# Patient Record
Sex: Male | Born: 1955
Health system: Southern US, Community
[De-identification: ages and names within clinical notes are randomized; demographics above are authoritative.]

## PROBLEM LIST (undated history)

## (undated) DIAGNOSIS — I714 Abdominal aortic aneurysm, without rupture, unspecified: Secondary | ICD-10-CM

## (undated) DIAGNOSIS — Z8619 Personal history of other infectious and parasitic diseases: Secondary | ICD-10-CM

## (undated) DIAGNOSIS — Z8661 Personal history of infections of the central nervous system: Secondary | ICD-10-CM

## (undated) DIAGNOSIS — E785 Hyperlipidemia, unspecified: Secondary | ICD-10-CM

## (undated) DIAGNOSIS — R519 Headache, unspecified: Secondary | ICD-10-CM

## (undated) DIAGNOSIS — K759 Inflammatory liver disease, unspecified: Secondary | ICD-10-CM

## (undated) DIAGNOSIS — L732 Hidradenitis suppurativa: Secondary | ICD-10-CM

## (undated) DIAGNOSIS — R439 Unspecified disturbances of smell and taste: Secondary | ICD-10-CM

## (undated) DIAGNOSIS — M47812 Spondylosis without myelopathy or radiculopathy, cervical region: Secondary | ICD-10-CM

## (undated) DIAGNOSIS — R188 Other ascites: Secondary | ICD-10-CM

## (undated) DIAGNOSIS — IMO0002 Reserved for concepts with insufficient information to code with codable children: Secondary | ICD-10-CM

## (undated) DIAGNOSIS — I2510 Atherosclerotic heart disease of native coronary artery without angina pectoris: Secondary | ICD-10-CM

## (undated) DIAGNOSIS — K7460 Unspecified cirrhosis of liver: Secondary | ICD-10-CM

## (undated) DIAGNOSIS — K219 Gastro-esophageal reflux disease without esophagitis: Secondary | ICD-10-CM

## (undated) DIAGNOSIS — G894 Chronic pain syndrome: Secondary | ICD-10-CM

## (undated) DIAGNOSIS — Z8701 Personal history of pneumonia (recurrent): Secondary | ICD-10-CM

## (undated) DIAGNOSIS — R351 Nocturia: Secondary | ICD-10-CM

## (undated) DIAGNOSIS — E119 Type 2 diabetes mellitus without complications: Secondary | ICD-10-CM

## (undated) DIAGNOSIS — R892 Abnormal level of other drugs, medicaments and biological substances in specimens from other organs, systems and tissues: Secondary | ICD-10-CM

## (undated) DIAGNOSIS — M5126 Other intervertebral disc displacement, lumbar region: Secondary | ICD-10-CM

## (undated) DIAGNOSIS — E538 Deficiency of other specified B group vitamins: Secondary | ICD-10-CM

## (undated) DIAGNOSIS — R51 Headache: Secondary | ICD-10-CM

## (undated) DIAGNOSIS — M4800 Spinal stenosis, site unspecified: Secondary | ICD-10-CM

## (undated) DIAGNOSIS — I219 Acute myocardial infarction, unspecified: Secondary | ICD-10-CM

## (undated) DIAGNOSIS — I10 Essential (primary) hypertension: Secondary | ICD-10-CM

## (undated) DIAGNOSIS — I255 Ischemic cardiomyopathy: Secondary | ICD-10-CM

## (undated) DIAGNOSIS — J449 Chronic obstructive pulmonary disease, unspecified: Secondary | ICD-10-CM

## (undated) DIAGNOSIS — E669 Obesity, unspecified: Secondary | ICD-10-CM

## (undated) DIAGNOSIS — H269 Unspecified cataract: Secondary | ICD-10-CM

## (undated) DIAGNOSIS — G600 Hereditary motor and sensory neuropathy: Secondary | ICD-10-CM

## (undated) DIAGNOSIS — K7581 Nonalcoholic steatohepatitis (NASH): Secondary | ICD-10-CM

## (undated) DIAGNOSIS — R0600 Dyspnea, unspecified: Secondary | ICD-10-CM

## (undated) DIAGNOSIS — R0609 Other forms of dyspnea: Secondary | ICD-10-CM

## (undated) DIAGNOSIS — I252 Old myocardial infarction: Secondary | ICD-10-CM

## (undated) DIAGNOSIS — M169 Osteoarthritis of hip, unspecified: Secondary | ICD-10-CM

## (undated) HISTORY — DX: Abdominal aortic aneurysm, without rupture: I71.4

## (undated) HISTORY — DX: Spondylosis without myelopathy or radiculopathy, cervical region: M47.812

## (undated) HISTORY — DX: Abnormal level of other drugs, medicaments and biological substances in specimens from other organs, systems and tissues: R89.2

## (undated) HISTORY — DX: Abdominal aortic aneurysm, without rupture, unspecified: I71.40

## (undated) HISTORY — DX: Unspecified cirrhosis of liver: K74.60

## (undated) HISTORY — DX: Type 2 diabetes mellitus without complications: E11.9

## (undated) HISTORY — DX: Ischemic cardiomyopathy: I25.5

## (undated) HISTORY — PX: CORONARY ANGIOPLASTY: SHX604

## (undated) HISTORY — PX: CORONARY ANGIOPLASTY WITH STENT PLACEMENT: SHX49

## (undated) HISTORY — DX: Personal history of other infectious and parasitic diseases: Z86.19

## (undated) HISTORY — DX: Atherosclerotic heart disease of native coronary artery without angina pectoris: I25.10

## (undated) HISTORY — PX: LUMBAR DISC SURGERY: SHX700

## (undated) HISTORY — PX: CARDIAC CATHETERIZATION: SHX172

## (undated) HISTORY — DX: Hidradenitis suppurativa: L73.2

## (undated) HISTORY — DX: Essential (primary) hypertension: I10

## (undated) HISTORY — DX: Old myocardial infarction: I25.2

## (undated) HISTORY — PX: MYELOGRAM: SHX5347

## (undated) HISTORY — DX: Chronic pain syndrome: G89.4

## (undated) HISTORY — DX: Personal history of infections of the central nervous system: Z86.61

## (undated) HISTORY — DX: Hyperlipidemia, unspecified: E78.5

## (undated) HISTORY — PX: CARDIOVASCULAR STRESS TEST: SHX262

## (undated) HISTORY — DX: Spinal stenosis, site unspecified: M48.00

## (undated) HISTORY — DX: Chronic obstructive pulmonary disease, unspecified: J44.9

## (undated) HISTORY — DX: Nonalcoholic steatohepatitis (NASH): K75.81

## (undated) HISTORY — DX: Unspecified disturbances of smell and taste: R43.9

## (undated) HISTORY — DX: Reserved for concepts with insufficient information to code with codable children: IMO0002

## (undated) HISTORY — DX: Obesity, unspecified: E66.9

## (undated) HISTORY — DX: Osteoarthritis of hip, unspecified: M16.9

## (undated) HISTORY — DX: Other intervertebral disc displacement, lumbar region: M51.26

## (undated) HISTORY — DX: Deficiency of other specified B group vitamins: E53.8

---
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
## 1981-06-05 DIAGNOSIS — Z8619 Personal history of other infectious and parasitic diseases: Secondary | ICD-10-CM

## 1981-06-05 HISTORY — DX: Personal history of other infectious and parasitic diseases: Z86.19

## 1998-06-05 DIAGNOSIS — Z8661 Personal history of infections of the central nervous system: Secondary | ICD-10-CM

## 1998-06-05 HISTORY — DX: Personal history of infections of the central nervous system: Z86.61

## 1998-07-20 ENCOUNTER — Inpatient Hospital Stay (HOSPITAL_COMMUNITY): Admission: EM | Admit: 1998-07-20 | Discharge: 1998-07-22 | Payer: Self-pay | Admitting: Emergency Medicine

## 1998-07-20 ENCOUNTER — Encounter: Payer: Self-pay | Admitting: Internal Medicine

## 1999-03-13 ENCOUNTER — Emergency Department (HOSPITAL_COMMUNITY): Admission: EM | Admit: 1999-03-13 | Discharge: 1999-03-13 | Payer: Self-pay | Admitting: Emergency Medicine

## 1999-08-01 ENCOUNTER — Emergency Department (HOSPITAL_COMMUNITY): Admission: EM | Admit: 1999-08-01 | Discharge: 1999-08-01 | Payer: Self-pay | Admitting: Emergency Medicine

## 1999-08-01 ENCOUNTER — Encounter: Payer: Self-pay | Admitting: Emergency Medicine

## 1999-08-04 ENCOUNTER — Emergency Department (HOSPITAL_COMMUNITY): Admission: EM | Admit: 1999-08-04 | Discharge: 1999-08-04 | Payer: Self-pay | Admitting: Emergency Medicine

## 2002-09-01 ENCOUNTER — Emergency Department (HOSPITAL_COMMUNITY): Admission: EM | Admit: 2002-09-01 | Discharge: 2002-09-01 | Payer: Self-pay | Admitting: Emergency Medicine

## 2002-10-05 ENCOUNTER — Encounter: Payer: Self-pay | Admitting: Emergency Medicine

## 2002-10-05 ENCOUNTER — Emergency Department (HOSPITAL_COMMUNITY): Admission: EM | Admit: 2002-10-05 | Discharge: 2002-10-05 | Payer: Self-pay

## 2004-03-19 ENCOUNTER — Inpatient Hospital Stay (HOSPITAL_COMMUNITY): Admission: EM | Admit: 2004-03-19 | Discharge: 2004-03-21 | Payer: Self-pay | Admitting: Emergency Medicine

## 2004-03-19 ENCOUNTER — Ambulatory Visit: Admission: RE | Admit: 2004-03-19 | Discharge: 2004-03-19 | Payer: Self-pay | Admitting: Cardiology

## 2004-04-06 ENCOUNTER — Ambulatory Visit: Payer: Self-pay | Admitting: *Deleted

## 2004-04-06 ENCOUNTER — Ambulatory Visit: Payer: Self-pay | Admitting: Internal Medicine

## 2004-05-23 ENCOUNTER — Ambulatory Visit: Payer: Self-pay | Admitting: Internal Medicine

## 2004-07-07 ENCOUNTER — Ambulatory Visit: Payer: Self-pay | Admitting: Internal Medicine

## 2004-11-11 ENCOUNTER — Ambulatory Visit: Payer: Self-pay | Admitting: Internal Medicine

## 2005-03-23 ENCOUNTER — Ambulatory Visit: Payer: Self-pay | Admitting: Cardiology

## 2005-03-30 ENCOUNTER — Inpatient Hospital Stay (HOSPITAL_BASED_OUTPATIENT_CLINIC_OR_DEPARTMENT_OTHER): Admission: RE | Admit: 2005-03-30 | Discharge: 2005-03-30 | Payer: Self-pay | Admitting: Cardiology

## 2005-03-30 ENCOUNTER — Observation Stay (HOSPITAL_COMMUNITY): Admission: AD | Admit: 2005-03-30 | Discharge: 2005-03-31 | Payer: Self-pay | Admitting: Cardiology

## 2005-03-30 ENCOUNTER — Encounter: Payer: Self-pay | Admitting: Internal Medicine

## 2005-03-31 ENCOUNTER — Ambulatory Visit: Payer: Self-pay | Admitting: Cardiology

## 2005-04-19 ENCOUNTER — Ambulatory Visit: Payer: Self-pay | Admitting: Cardiology

## 2005-05-12 ENCOUNTER — Ambulatory Visit: Payer: Self-pay | Admitting: Internal Medicine

## 2005-06-15 ENCOUNTER — Ambulatory Visit: Payer: Self-pay | Admitting: Cardiology

## 2005-06-27 ENCOUNTER — Ambulatory Visit: Payer: Self-pay | Admitting: Internal Medicine

## 2005-06-28 ENCOUNTER — Ambulatory Visit: Payer: Self-pay | Admitting: Cardiology

## 2005-06-29 ENCOUNTER — Ambulatory Visit (HOSPITAL_COMMUNITY): Admission: RE | Admit: 2005-06-29 | Discharge: 2005-06-29 | Payer: Self-pay | Admitting: Internal Medicine

## 2005-08-11 ENCOUNTER — Ambulatory Visit: Payer: Self-pay | Admitting: Internal Medicine

## 2005-10-03 ENCOUNTER — Ambulatory Visit: Payer: Self-pay | Admitting: Internal Medicine

## 2005-11-13 ENCOUNTER — Ambulatory Visit: Payer: Self-pay | Admitting: Internal Medicine

## 2005-12-14 ENCOUNTER — Ambulatory Visit: Payer: Self-pay | Admitting: Internal Medicine

## 2006-01-19 ENCOUNTER — Ambulatory Visit: Payer: Self-pay | Admitting: Internal Medicine

## 2006-03-02 ENCOUNTER — Ambulatory Visit: Payer: Self-pay | Admitting: Internal Medicine

## 2006-03-26 ENCOUNTER — Ambulatory Visit: Payer: Self-pay | Admitting: Internal Medicine

## 2006-05-02 ENCOUNTER — Ambulatory Visit: Payer: Self-pay | Admitting: Internal Medicine

## 2006-05-18 ENCOUNTER — Emergency Department (HOSPITAL_COMMUNITY): Admission: EM | Admit: 2006-05-18 | Discharge: 2006-05-18 | Payer: Self-pay | Admitting: Emergency Medicine

## 2006-07-24 ENCOUNTER — Ambulatory Visit: Payer: Self-pay | Admitting: Internal Medicine

## 2007-03-04 ENCOUNTER — Telehealth: Payer: Self-pay | Admitting: Internal Medicine

## 2007-03-07 ENCOUNTER — Encounter: Admission: RE | Admit: 2007-03-07 | Discharge: 2007-03-07 | Payer: Self-pay | Admitting: Gastroenterology

## 2007-05-06 HISTORY — PX: COLONOSCOPY: SHX174

## 2007-11-04 ENCOUNTER — Encounter: Admission: RE | Admit: 2007-11-04 | Discharge: 2007-11-04 | Payer: Self-pay | Admitting: Internal Medicine

## 2008-01-28 ENCOUNTER — Encounter: Admission: RE | Admit: 2008-01-28 | Discharge: 2008-01-28 | Payer: Self-pay | Admitting: Internal Medicine

## 2008-03-01 ENCOUNTER — Emergency Department (HOSPITAL_COMMUNITY): Admission: EM | Admit: 2008-03-01 | Discharge: 2008-03-01 | Payer: Self-pay | Admitting: Emergency Medicine

## 2008-03-04 ENCOUNTER — Ambulatory Visit: Payer: Self-pay | Admitting: Cardiology

## 2008-04-05 ENCOUNTER — Emergency Department (HOSPITAL_COMMUNITY): Admission: EM | Admit: 2008-04-05 | Discharge: 2008-04-05 | Payer: Self-pay | Admitting: Emergency Medicine

## 2008-04-06 ENCOUNTER — Encounter: Admission: RE | Admit: 2008-04-06 | Discharge: 2008-04-06 | Payer: Self-pay | Admitting: Urology

## 2008-04-14 ENCOUNTER — Encounter: Admission: RE | Admit: 2008-04-14 | Discharge: 2008-04-14 | Payer: Self-pay | Admitting: Neurology

## 2008-04-14 ENCOUNTER — Encounter: Admission: RE | Admit: 2008-04-14 | Discharge: 2008-04-14 | Payer: Self-pay | Admitting: Urology

## 2008-05-17 ENCOUNTER — Emergency Department (HOSPITAL_COMMUNITY): Admission: EM | Admit: 2008-05-17 | Discharge: 2008-05-17 | Payer: Self-pay | Admitting: Emergency Medicine

## 2008-06-10 ENCOUNTER — Encounter: Admission: RE | Admit: 2008-06-10 | Discharge: 2008-06-10 | Payer: Self-pay | Admitting: Neurology

## 2008-07-18 ENCOUNTER — Emergency Department (HOSPITAL_COMMUNITY): Admission: EM | Admit: 2008-07-18 | Discharge: 2008-07-18 | Payer: Self-pay | Admitting: Emergency Medicine

## 2009-01-12 ENCOUNTER — Encounter (INDEPENDENT_AMBULATORY_CARE_PROVIDER_SITE_OTHER): Payer: Self-pay | Admitting: *Deleted

## 2009-04-07 ENCOUNTER — Encounter: Payer: Self-pay | Admitting: Cardiology

## 2009-07-12 ENCOUNTER — Encounter: Payer: Self-pay | Admitting: Family Medicine

## 2009-07-12 LAB — CONVERTED CEMR LAB
Hemoglobin: 13.4 g/dL
MCV: 99.1 fL
Platelets: 259 10*3/uL
Uric Acid, Serum: 9.8 mg/dL

## 2009-08-30 ENCOUNTER — Telehealth: Payer: Self-pay | Admitting: Cardiology

## 2009-09-07 DIAGNOSIS — G894 Chronic pain syndrome: Secondary | ICD-10-CM | POA: Insufficient documentation

## 2009-09-07 DIAGNOSIS — J449 Chronic obstructive pulmonary disease, unspecified: Secondary | ICD-10-CM | POA: Insufficient documentation

## 2009-09-07 DIAGNOSIS — E785 Hyperlipidemia, unspecified: Secondary | ICD-10-CM | POA: Insufficient documentation

## 2009-09-07 DIAGNOSIS — F411 Generalized anxiety disorder: Secondary | ICD-10-CM | POA: Insufficient documentation

## 2009-09-07 DIAGNOSIS — I10 Essential (primary) hypertension: Secondary | ICD-10-CM | POA: Insufficient documentation

## 2009-09-07 DIAGNOSIS — G600 Hereditary motor and sensory neuropathy: Secondary | ICD-10-CM | POA: Insufficient documentation

## 2009-09-07 DIAGNOSIS — M109 Gout, unspecified: Secondary | ICD-10-CM | POA: Insufficient documentation

## 2009-09-07 DIAGNOSIS — I428 Other cardiomyopathies: Secondary | ICD-10-CM | POA: Insufficient documentation

## 2009-09-07 DIAGNOSIS — I2510 Atherosclerotic heart disease of native coronary artery without angina pectoris: Secondary | ICD-10-CM | POA: Insufficient documentation

## 2009-09-14 ENCOUNTER — Encounter (INDEPENDENT_AMBULATORY_CARE_PROVIDER_SITE_OTHER): Payer: Self-pay | Admitting: *Deleted

## 2009-10-21 ENCOUNTER — Ambulatory Visit: Payer: Self-pay | Admitting: Cardiology

## 2009-10-21 DIAGNOSIS — F17200 Nicotine dependence, unspecified, uncomplicated: Secondary | ICD-10-CM | POA: Insufficient documentation

## 2009-10-21 DIAGNOSIS — Z789 Other specified health status: Secondary | ICD-10-CM

## 2009-10-25 ENCOUNTER — Telehealth (INDEPENDENT_AMBULATORY_CARE_PROVIDER_SITE_OTHER): Payer: Self-pay | Admitting: *Deleted

## 2009-10-26 ENCOUNTER — Ambulatory Visit: Payer: Self-pay | Admitting: Internal Medicine

## 2009-10-26 ENCOUNTER — Encounter: Payer: Self-pay | Admitting: Family Medicine

## 2009-10-26 ENCOUNTER — Encounter (HOSPITAL_COMMUNITY): Admission: RE | Admit: 2009-10-26 | Discharge: 2010-01-05 | Payer: Self-pay | Admitting: Cardiology

## 2009-10-26 ENCOUNTER — Ambulatory Visit: Payer: Self-pay

## 2009-10-26 LAB — CONVERTED CEMR LAB
HDL: 31 mg/dL
Hgb A1c MFr Bld: 8.1 %
LDL Cholesterol: 83 mg/dL
PSA: 0.28 ng/mL
Uric Acid, Serum: 7 mg/dL

## 2009-10-28 ENCOUNTER — Ambulatory Visit: Payer: Self-pay

## 2009-11-30 ENCOUNTER — Ambulatory Visit: Payer: Self-pay | Admitting: Cardiology

## 2009-12-03 LAB — CONVERTED CEMR LAB
AST: 44 units/L — ABNORMAL HIGH (ref 0–37)
Alkaline Phosphatase: 97 units/L (ref 39–117)
Cholesterol: 154 mg/dL (ref 0–200)
Direct LDL: 81.3 mg/dL
Total Bilirubin: 0.6 mg/dL (ref 0.3–1.2)
Total CHOL/HDL Ratio: 6
VLDL: 64.8 mg/dL — ABNORMAL HIGH (ref 0.0–40.0)

## 2009-12-09 ENCOUNTER — Encounter: Admission: RE | Admit: 2009-12-09 | Discharge: 2009-12-09 | Payer: Self-pay | Admitting: Neurosurgery

## 2010-01-07 ENCOUNTER — Inpatient Hospital Stay (HOSPITAL_COMMUNITY): Admission: RE | Admit: 2010-01-07 | Discharge: 2010-01-09 | Payer: Self-pay | Admitting: Neurosurgery

## 2010-01-07 ENCOUNTER — Ambulatory Visit: Payer: Self-pay | Admitting: Pulmonary Disease

## 2010-01-07 HISTORY — PX: ANTERIOR CERVICAL DECOMP/DISCECTOMY FUSION: SHX1161

## 2010-02-02 ENCOUNTER — Encounter: Payer: Self-pay | Admitting: Family Medicine

## 2010-02-02 LAB — CONVERTED CEMR LAB
ALT: 36 units/L
AST: 31 units/L
Cholesterol: 148 mg/dL
Creatinine, Ser: 0.85 mg/dL
HDL: 27 mg/dL
LDL Cholesterol: 68 mg/dL
Sodium: 140 meq/L
Total Bilirubin: 0.4 mg/dL
Total Protein: 7 g/dL

## 2010-04-07 ENCOUNTER — Encounter: Payer: Self-pay | Admitting: Family Medicine

## 2010-05-03 ENCOUNTER — Ambulatory Visit: Payer: Self-pay | Admitting: Internal Medicine

## 2010-05-03 DIAGNOSIS — L732 Hidradenitis suppurativa: Secondary | ICD-10-CM | POA: Insufficient documentation

## 2010-05-05 DIAGNOSIS — M47812 Spondylosis without myelopathy or radiculopathy, cervical region: Secondary | ICD-10-CM

## 2010-05-05 HISTORY — DX: Spondylosis without myelopathy or radiculopathy, cervical region: M47.812

## 2010-05-16 ENCOUNTER — Ambulatory Visit: Payer: Self-pay | Admitting: Internal Medicine

## 2010-05-16 DIAGNOSIS — G4733 Obstructive sleep apnea (adult) (pediatric): Secondary | ICD-10-CM | POA: Insufficient documentation

## 2010-05-18 ENCOUNTER — Inpatient Hospital Stay (HOSPITAL_COMMUNITY)
Admission: RE | Admit: 2010-05-18 | Discharge: 2010-05-20 | Payer: Self-pay | Source: Home / Self Care | Attending: Neurosurgery | Admitting: Neurosurgery

## 2010-05-18 HISTORY — PX: LUMBAR LAMINECTOMY: SHX95

## 2010-06-26 ENCOUNTER — Encounter: Payer: Self-pay | Admitting: Neurology

## 2010-06-27 ENCOUNTER — Other Ambulatory Visit: Payer: Self-pay | Admitting: Family Medicine

## 2010-06-27 ENCOUNTER — Ambulatory Visit
Admission: RE | Admit: 2010-06-27 | Discharge: 2010-06-27 | Payer: Self-pay | Source: Home / Self Care | Attending: Family Medicine | Admitting: Family Medicine

## 2010-06-27 DIAGNOSIS — R7303 Prediabetes: Secondary | ICD-10-CM | POA: Insufficient documentation

## 2010-06-27 LAB — CBC WITH DIFFERENTIAL/PLATELET
Eosinophils Relative: 3.1 % (ref 0.0–5.0)
Hemoglobin: 13.9 g/dL (ref 13.0–17.0)
Lymphocytes Relative: 25.4 % (ref 12.0–46.0)
Lymphs Abs: 2.3 10*3/uL (ref 0.7–4.0)
Monocytes Relative: 5.2 % (ref 3.0–12.0)
Neutrophils Relative %: 65.8 % (ref 43.0–77.0)
Platelets: 279 10*3/uL (ref 150.0–400.0)
RDW: 15.7 % — ABNORMAL HIGH (ref 11.5–14.6)

## 2010-06-27 LAB — HEMOGLOBIN A1C: Hgb A1c MFr Bld: 6.1 % (ref 4.6–6.5)

## 2010-06-27 LAB — HEPATIC FUNCTION PANEL
ALT: 18 U/L (ref 0–53)
AST: 26 U/L (ref 0–37)
Alkaline Phosphatase: 104 U/L (ref 39–117)
Bilirubin, Direct: 0.1 mg/dL (ref 0.0–0.3)
Total Bilirubin: 0.7 mg/dL (ref 0.3–1.2)
Total Protein: 8.1 g/dL (ref 6.0–8.3)

## 2010-06-27 LAB — BASIC METABOLIC PANEL
Chloride: 99 mEq/L (ref 96–112)
Creatinine, Ser: 1 mg/dL (ref 0.4–1.5)
GFR: 86.33 mL/min (ref >60.00)
Glucose, Bld: 89 mg/dL (ref 70–99)

## 2010-06-27 LAB — LDL CHOLESTEROL, DIRECT: Direct LDL: 127.4 mg/dL

## 2010-06-27 LAB — LIPID PANEL: Total CHOL/HDL Ratio: 7

## 2010-07-01 ENCOUNTER — Other Ambulatory Visit: Payer: Self-pay

## 2010-07-01 DIAGNOSIS — L732 Hidradenitis suppurativa: Secondary | ICD-10-CM

## 2010-07-05 ENCOUNTER — Encounter: Payer: Self-pay | Admitting: Family Medicine

## 2010-07-05 NOTE — Progress Notes (Signed)
Summary: Nuclear Pre-Procedure  Phone Note Outgoing Call Call back at [facility] Phone [phone]   Call placed by: Eliezer Lofts, EMT-P,  Oct 25, 2009 2:22 PM Call placed to: Patient Action Taken: Phone Call Completed Summary of Call: Reviewed information on Myoview Information Sheet (see scanned document for further details).  Spoke with Patient.    Nuclear Med Background Indications for Stress Test: Evaluation for Ischemia, Surgical Clearance, Stent Patency  Indications Comments: Pending Back Surgery  History: Echo, Heart Catheterization, Myocardial Infarction, Myocardial Perfusion Study, Stents  History Comments: '00 Inferior MI 8/05 MPS: EF= 49%, InfBasal Scar, (-) ischemia 10/05 Stents: RCA x4 10/06 Heart Cath: Patent RCA stents, EF= 40% '06 Echo  Symptoms: DOE    Nuclear Pre-Procedure Cardiac Risk Factors: Family History - CAD, Hypertension, Lipids, Obesity, Smoker Height (in): 5

## 2010-07-05 NOTE — Assessment & Plan Note (Signed)
Summary: ROV./ GD  Medications Added METFORMIN HCL 500 MG TABS (METFORMIN HCL) 2 tabs by mouth two times a day PROVENTIL HFA 108 (90 BASE) MCG/ACT AERS (ALBUTEROL SULFATE) as directed PRAVASTATIN SODIUM 40 MG TABS (PRAVASTATIN SODIUM) Take one tablet by mouth daily at bedtime ASPIRIN 81 MG TBEC (ASPIRIN) Take one tablet by mouth daily TOPROL XL 50 MG XR24H-TAB (METOPROLOL SUCCINATE) 1  tab by mouth once daily TOPROL XL 50 MG XR24H-TAB (METOPROLOL SUCCINATE) 1/2  tab by mouth once daily XANAX 0.5 MG TABS (ALPRAZOLAM) 1 tab by mouth two times a day VICODIN HP 10-660 MG TABS (HYDROCODONE-ACETAMINOPHEN) 4 times daily as needed * ULORIC  (FEBUXOSTAT) 1 tab by mouth once daily        History of Present Illness: [name] is a pleasant gentleman who I have seen in the past for coronary artery disease.  He has had prior inferior infarct with PCI of his right coronary artery and PDA.  His most recent cardiac catheterization was performed on March 30, 2005.  The patient had an ejection fraction of 40% with inferior akinesis at that time.  However, his stents in the right coronary artery were widely patent.  Note, his most recent echocardiogram on March 30, 2005 showed normal to mildly reduced LV function.  I have not seen him since Sept 2009.  Since that time, he does describe dyspnea on exertion relieved with rest. There is no associated chest pain. There is no orthopnea, PND, pedal edema, palpitations or syncope. Note he is having significant back problems which may require surgery.  Current Medications (verified): 1)  Metformin Hcl 500 Mg Tabs (Metformin Hcl) .... 2 Tabs By Mouth Two Times A Day 2)  Proventil Hfa 108 (90 Base) Mcg/act Aers (Albuterol Sulfate) .... As Directed 3)  Pravastatin Sodium 40 Mg Tabs (Pravastatin Sodium) .... Take One Tablet By Mouth Daily At Bedtime 4)  Aspirin 81 Mg Tbec (Aspirin) .... Take One Tablet By Mouth Daily 5)  Plavix 75 Mg Tabs (Clopidogrel Bisulfate)  .... Take One Tablet By Mouth Daily 6)  Toprol Xl 50 Mg Xr24h-Tab (Metoprolol Succinate) .[name].. 1  Tab By Mouth Once Daily 7)  Ambien 10 Mg Tabs (Zolpidem Tartrate) .[name].. 1 Tab By Mouth At Bedtime 8)  Xanax 0.5 Mg Tabs (Alprazolam) .[name].. 1 Tab By Mouth Two Times A Day 9)  Vicodin Hp 10-660 Mg Tabs (Hydrocodone-Acetaminophen) .... 4 Times Daily As Needed 10)  Uloric  (Febuxostat) .[name].. 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 non-drinker, no drug abuse, current  tobacco abuse  Review of Systems       Significant muscle aches from Charcot-Marie-Tooth disease as well as back pain. no fevers or chills, productive cough, hemoptysis, dysphasia, odynophagia, melena, hematochezia, dysuria, hematuria, rash, seizure activity, orthopnea, PND, pedal edema, claudication. Remaining systems are negative.   Vital Signs:  Patient profile:   55 year old male Height:      69 inches Weight:      327 pounds BMI:     48.46 Pulse rate:   88 / minute Resp:     18 per minute BP sitting:   95 / 69  (left arm)  Vitals Entered By: Burnett Kanaris (Oct 21, 2009 10:27 AM)  Physical Exam  General:  Well-developed obese in no  acute distress.  Skin is warm and dry.  HEENT is normal.  Neck is supple. No thyromegaly.  Chest is clear to auscultation with normal expansion.  Cardiovascular exam is regular rate and rhythm.  Abdominal exam nontender or distended. No masses palpated. Extremities show trace edema. neuro grossly intact    EKG  Procedure date:  10/21/2009  Findings:      Normal sinus rhythm at a rate of 88. Axis normal. Cannot rule out prior inferior  infarct.  Impression & Recommendations:  Problem # 1:  DYSPNEA (ICD-786.05) Most likely multifactorial including tobacco use, obesity and possible contribution from coronary disease. Scheduled particularly in light of potential surgery. If no ischemia plan medical therapy and patient can proceed with surgery. I stressed the importance of weight loss. The following medications were removed from the medication list:    Lisinopril 2.5 Mg Tabs (Lisinopril) .[name]... Take one tablet by mouth daily His updated medication list for this problem includes:    Aspirin 81 Mg Tbec (Aspirin) .[name]... Take one tablet by mouth daily    Toprol Xl 50 Mg Xr24h-tab (Metoprolol succinate) .[name]... 1  tab by mouth once daily    Toprol Xl 50 Mg Xr24h-tab (Metoprolol succinate) .[name]... 1/2  tab by mouth once daily  Problem # 2:  HYPERTENSION (ICD-401.9)  Blood pressure somewhat low and occasional dizziness. Decrease Toprol to 25 mg p.o. daily. The following medications were removed from the medication list:    Lisinopril 2.5 Mg Tabs (Lisinopril) .[name]... Take one tablet by mouth daily His updated medication list for this problem includes:    Aspirin 81 Mg Tbec (Aspirin) .[name]... Take one tablet by mouth daily    Toprol Xl 50 Mg Xr24h-tab (Metoprolol succinate) .[name]... 1  tab by mouth once daily    Toprol Xl 50 Mg Xr24h-tab (Metoprolol succinate) .[name]... 1/2  tab by mouth once daily  The following medications were removed from the medication list:    Lisinopril 2.5 Mg Tabs (Lisinopril) .[name]... Take one tablet by mouth daily His updated medication list for this problem includes:    Aspirin 81 Mg Tbec (Aspirin) .[name]... Take one tablet by mouth daily    Toprol Xl 50 Mg Xr24h-tab (Metoprolol succinate) .[name]... 1  tab by mouth once daily    Toprol Xl 50 Mg Xr24h-tab (Metoprolol succinate) .[name]... 1/2  tab by mouth once daily  Problem # 3:  HYPERLIPIDEMIA (ICD-272.4)  Resume pravastatin and check lipids and liver in 6 weeks. His updated  medication list for this problem includes:    Pravastatin Sodium 40 Mg Tabs (Pravastatin sodium) .[name]... Take one tablet by mouth daily at bedtime  His updated medication list for this problem includes:    Pravastatin Sodium 40 Mg Tabs (Pravastatin sodium) .[name]... Take one tablet by mouth daily at bedtime  Problem # 4:  CAD (ICD-414.00) Continue aspirin, beta blocker and resume statin. Discontinue Plavix. Check myoview The following medications were removed from the medication list:    Lisinopril 2.5 Mg Tabs (Lisinopril) .[name]... Take one tablet by mouth daily His updated medication list for this problem includes:    Aspirin 81 Mg Tbec (Aspirin) .[name]... Take one tablet by mouth daily    Toprol Xl 50 Mg Xr24h-tab (Metoprolol succinate) .[name]... 1  tab by mouth once daily    Toprol Xl 50 Mg Xr24h-tab (Metoprolol succinate) .[name]... 1/2  tab by mouth once daily  Orders: Nuclear Stress Test (Nuc Stress Test)  Problem # 5:  COPD (ZOX-096)  His updated medication  list for this problem includes:    Proventil Hfa 108 (90 Base) Mcg/act Aers (Albuterol sulfate) .[name]... 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  Problem # 7:  TOBACCO ABUSE (ICD-305.1) Pt counseled on discontinuing  Patient Instructions: 1)  Your physician recommends that you schedule a follow-up appointment in: ONE YEAR 2)  Your physician recommends that you return for lab work in:4-6 WEEKS-LIPID/LIVER/272.0/V58.69 3)  Your physician has recommended you make the following change in your medication: STOP PLAVIX 4)  RESTART PRAVASTATIN 40MG ONCE DAILY 5)  DECREASE METOPROLOL TO 1/2 TABLET(25MG) ONCE DAILY 6)  Your physician has requested that you have an [place].  For further information please visit [email or web address].  Please follow instruction sheet, as given. Prescriptions: PRAVASTATIN SODIUM 40 MG TABS (PRAVASTATIN SODIUM) Take one tablet by mouth daily at bedtime  #30 x  12   Entered by:   Fredia Beets, RN   Authorized by:   Colin Mulders, MD, [facility]   Signed by:   Fredia Beets, RN on 10/21/2009   Method used:   Electronically to        [id number] (retail)       [place], [place]  [address]       Ph: [phone]       Fax: [phone]   RxID:   [id number] TOPROL XL 50 MG XR24H-TAB (METOPROLOL SUCCINATE) 1/2  tab by mouth once daily  #30 x 12   Entered by:   Fredia Beets, RN   Authorized by:   Colin Mulders, MD, [facility]   Signed by:   Fredia Beets, RN on 10/21/2009   Method used:   Electronically to        [id number] (retail)       [address] [place], [place]  [address]       Ph: [phone]       Fax: [phone]   RxID:   [id number]

## 2010-07-05 NOTE — Letter (Signed)
Summary: Appointment - Missed  [place], [place]    Phone:   Fax:      September 14, 2009 MRN: [id number]   [address] [place], [place]  [address]   Dear Mr. Posner,  Our records indicate you missed your appointment on  1956-05-31 with  Dr. Stanford Breed  It is very important that we reach you to reschedule this appointment. We look forward to participating in your health care needs. Please contact us at the number listed above at your earliest convenience to reschedule this appointment.     Sincerely,       [facility] Scheduling Team

## 2010-07-05 NOTE — Progress Notes (Signed)
Summary: pt wants to take myoview   Phone Note Call from Patient Call back at Home Phone [phone]   Caller: Patient Reason for Call: Talk to Nurse, Talk to Doctor Summary of Call: pt has appt in april and wants to know if he can get the [facility] he was suppose to get done in 2009 done the sameday as this office visit Initial call taken by: Shelda Pal,  August 30, 2009 1:56 PM  Follow-up for Phone Call        08/30/09--pt calling wanting to know if he can have stress test same day as appoint with dr crenshaw--called back and spoke with wife--advised--will have to any testing another day as pt has not been seen since '09, dr Stanford Breed might not want stress test done--wife agrees and states she will tell husband--nt Follow-up by: Leodis Sias, RN,  August 30, 2009 4:22 PM

## 2010-07-05 NOTE — Assessment & Plan Note (Signed)
Summary: Cardiology Nuclear Study  Nuclear Med Background Indications for Stress Test: Evaluation for Ischemia, Surgical Clearance, Stent Patency  Indications Comments: Pending back surgery by Dr. Leeroy Cha  History: Echo, Heart Catheterization, Myocardial Infarction, Myocardial Perfusion Study, Stents  History Comments: '00 IWMI; 8/05 XYI:[id number] scar, no ischemia; 10/05 Stent-RCA x 4; 10/06 ath: Patent RCA stents,  EF=40%; '06 Echo:normal LVF  Symptoms: Chest Pressure, DOE, Fatigue  Symptoms Comments: Last episode of CP:1 month ago   Nuclear Pre-Procedure Cardiac Risk Factors: Family History - CAD, Hypertension, Lipids, NIDDM, Obesity, Smoker Caffeine/Decaff Intake: None NPO After: 7:30 AM Lungs: Coarse, deminished breath sounds, no wheezes, EF=95% on RA. IV 0.9% NS with Angio Cath: 22g     IV Site: (R) AC IV Started by: Irven Baltimore RN Chest Size (in) 56     Height (in): 69 Weight (lb): 322 BMI: 47.72  Nuclear Med Study 1 or 2 day study:  2 day     Stress Test Type:  Carlton Adam Reading MD:  Dorris Carnes, MD     Referring MD:  Kirk Ruths, MD Resting Radionuclide:  Technetium 27mTetrofosmin     Resting Radionuclide Dose:  33 mCi  Stress Radionuclide:  Technetium 921metrofosmin     Stress Radionuclide Dose:  33 mCi   Stress Protocol   Lexiscan: 0.4 mg   Stress Test Technologist:  ShValetta FullerMA-N     Nuclear Technologist:  StCharlton AmorNMT  Rest Procedure  Myocardial perfusion imaging was performed at rest 45 minutes following the intravenous administration of Myoview Technetium 9968mtrofosmin.  Stress Procedure  The patient received IV Lexiscan 0.4 mg over 15-seconds.  Myoview injected at 30-seconds.  There were no significant changes with lexiscan.  Quantitative spect images were obtained after a 45 minute delay.  QPS Raw Data Images:  Images were motion corrected.  Extensive soft tissue (diaphragm, subutaneous fat) surrounds heart. Stress Images:   Inferior defect (base, mid, distal); Apical defect;  Thnning in the anteior wall (mid) best seen really in the short axis views. Rest Images:  Minimal improvement in the apex. Subtraction (SDS):  No evidence of ischemia. Transient Ischemic Dilatation:  .93  (Normal <1.22)  Lung/Heart Ratio:  .46  (Normal <0.45)  Quantitative Gated Spect Images QGS EDV:  156 ml QGS ESV:  88 ml QGS EF:  44 %   Overall Impression  Exercise Capacity: Lexiscan protocol BP Response: Normal blood pressure response. Clinical Symptoms: No chest pain ECG Impression: No significant ST segment change suggestive of ischemia. Overall Impression Comments: Scar in the inferior wall.  No signficant ischemia.    Appended Document: Cardiology Nuclear Study ok  Appended Document: Cardiology Nuclear Study pt aware of results

## 2010-07-05 NOTE — Assessment & Plan Note (Signed)
Summary: NEW PT TO EST/CLE NPP   Vital Signs:  Patient profile:   55 year old male Weight:      318.25 pounds Temp:     98.4 degrees F oral Pulse rate:   88 / minute Pulse rhythm:   regular BP sitting:   118 / 80  (left arm) Cuff size:   large  Vitals Entered By: Maudie Mercury Dance CMA Deborra Medina) (May 03, 2010 2:12 PM) CC: New Patient to establish care   History of Present Illness: CC: new pt, establish  Presents with wife.    previously saw Dr. Girtha Hake [place].  didn't feel taken care of well.  upset about tick bite incident.    Dr. Leeroy Cha - recently had 3 ruptured disks repaired cervical, now to go 05/18/2010 for another 4 ruptured disks in back.  1. Charcot-Marie-Tooth - dx age 27yo.  affecting motor skills, strength.  "phase 2" of disease.  On hydrocodone 10/651m.  changed to 10/32105mby Dr. BoJoya Salm/2 concern of overmedication with tylenol.  feels oxycontin is too strong.  doesn't want morphine.  doesn't want to go to pain clinic.  Sees neurologist Dr. LoErling Cruzvery few years.  2. obesity - backed down on carbs and sodas.  down 30lbs.  3. smoking - 1/2 ppd daily.  precontemplative.  4. gout - on uloric. previously on allopurinol and colchicine.  no h/o CRI per patient.  preventative: last CPE - 88m66moo.  doesn't think due.   requests flu shot today. Tetanus shot pt thinks UTD.  Current Medications (verified): 1)  Metformin Hcl 500 Mg Tabs (Metformin Hcl) .... 2 Tabs By Mouth Two Times A Day 2)  Proventil Hfa 108 (90 Base) Mcg/act Aers (Albuterol Sulfate) .... As Directed 3)  Aspirin 81 Mg Tbec (Aspirin) .... Take One Tablet By Mouth Daily 4)  Toprol Xl 50 Mg Xr24h-Tab (Metoprolol Succinate) .... 1/2  Tab By Mouth Once Daily 5)  Xanax 0.5 Mg Tabs (Alprazolam) ....[name]1 Tab By Mouth Two Times A Day 6)  Norco 10-325 Mg Tabs (Hydrocodone-Acetaminophen) .... 4 Daily 7)  Uloric 40 Mg Tabs (Febuxostat) .... 1/2 Daily  Allergies (verified): No Known Drug Allergies  Past  History:  Past Medical History: Diabetes CAD (ICD-414.00)/MYOCARDIAL INFARCTION (ICD-410.90) x 3 (w/ stents) last 2005 HYPERLIPIDEMIA (ICD-272.4) COPD (ICD-496) CARDIOMYOPATHY (ICD-425.4) CHARCOT-MARIE-TOOTH DISEASE (ICD-356.1) [Dr. Love] GOUT (ICD-274.9) ANXIETY (ICD-300.00) Smoking Arthritis h/o ruptured discs in back [Dr. Botero] ?h/o ulcers Allergic rhinitis  Past Surgical History: T&A 1972 Horizon study stenting x 4 in the RCA with kissing balloon angioplasty  of the PDA ostium. (2000, 2005) 5 stents total per patient Left heart catheterization, left ventriculography, coronary angiography, intravascular ultrasound of the right coronary artery.  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  The patient denies anorexia, fever, weight gain, vision loss, decreased hearing, hoarseness, chest pain, syncope, dyspnea on exertion, peripheral edema, prolonged cough, headaches, hemoptysis, abdominal pain, melena, hematochezia, severe indigestion/heartburn, hematuria, depression, and testicular masses.    Physical Exam  General:  Well-developed,well-nourished,in no acute distress; alert,appropriate  and cooperative throughout examination  obese. Head:  Normocephalic and atraumatic without obvious abnormalities. No apparent alopecia or balding. Eyes:  No corneal or conjunctival inflammation noted. EOMI. Perrla.  Ears:  External ear exam shows no significant lesions or deformities.  Otoscopic examination reveals clear canals, tympanic membranes are intact  bilaterally without bulging, retraction, inflammation or discharge. Hearing is grossly normal bilaterally. Nose:  External nasal examination shows no deformity or inflammation. Nasal mucosa are pink and moist without lesions or exudates. Mouth:  Oral mucosa and oropharynx without lesions or exudates.  Teeth in good repair.  MMM Neck:  No deformities, masses, or tenderness noted. Lungs:  Normal respiratory effort, chest expands symmetrically. Lungs are clear to auscultation, no crackles or wheezes. Heart:  Normal rate and regular rhythm. S1 and S2 normal without gallop, murmur, click, rub or other extra sounds. Abdomen:  Bowel sounds positive,abdomen soft and non-tender without masses, organomegaly or hernias noted. Genitalia:  Testes bilaterally descended without nodularity, tenderness or masses. + R scrotal mass consistent with likely varicocele.  No penis lesions or urethral discharge.  + scrotum with indurations and nodular thickened skin, no erythema.  + perineal area with induration and thickened skin, chronic hyperpigmentation Msk:  No deformity or scoliosis noted of thoracic or lumbar spine.  + prominent fat R paraspinous thoracic area Pulses:  2+ rad/DP/PT pulses, brisk cap refill Extremities:  no pitting pedal edema.  + nonpitting edema bilateral legs Neurologic:  sensation diminished B feet.  CN grossly intact, station and gait intact.   Skin:  see genital exam Psych:  full affect, pleasant, conversant.   Impression & Recommendations:  Problem # 1:  HYPERTENSION (ICD-401.9) per patient never been told hypertensive.  will continue to monitor, review records from prior PCP, remove from list if not issue. The following medications were removed from the medication list:    Toprol Xl 50 Mg Xr24h-tab (Metoprolol succinate) .[name]... 1  tab by mouth once daily His updated medication list for this problem includes:    Toprol Xl 50 Mg Xr24h-tab (Metoprolol succinate) .[name]... 1/2  tab by mouth once  daily  Problem # 2:  TOBACCO ABUSE (ICD-305.1)  Pt counseled on discontinuing  Problem # 3:  HYPERLIPIDEMIA (ICD-272.4) self stopped pravastatin, states caused him to lose breath.  discussed goal for him is LDL less than 70.  too high trig, too low HDL.  The following medications were removed from the medication list:    Pravastatin Sodium 40 Mg Tabs (Pravastatin sodium) .[name]... Take one tablet by mouth daily at bedtime  Labs Reviewed: SGOT: 44 (11/30/2009)   SGPT: 59 (11/30/2009)   HDL:23.80 (11/30/2009)  Chol:154 (11/30/2009)  Trig:324.0 (11/30/2009)  Problem # 4:  COPD (ICD-496) continue for now, obtain records and review. His updated medication list for this problem includes:    Proventil Hfa 108 (90 Base) Mcg/act Aers (Albuterol sulfate) .[name]... As directed  Vaccines Reviewed: Flu Vax: Fluvax 3+ (05/03/2010)  Problem # 5:  CHARCOT-MARIE-TOOTH DISEASE (ICD-356.1) rec call Dr. Erling Cruz to schedule appt.  states in constant pain.  if that is case, have recommended get established at pain clinic.    Problem # 6:  GOUT (ICD-274.9) obtain records, see why on uloric instead of allopurinol. His updated medication list for this problem includes:    Uloric 40 Mg Tabs (Febuxostat) .[name]... 1/2 daily  Problem # 7:  CAD (ICD-414.00) followed by Dr. Stanford Breed.  s/p MI x3 and 5 stents per patient. The following medications were removed from the medication  list:    Toprol Xl 50 Mg Xr24h-tab (Metoprolol succinate) .[name]... 1  tab by mouth once daily His updated medication list for this problem includes:    Aspirin 81 Mg Tbec (Aspirin) .[name]... Take one tablet by mouth daily    Toprol Xl 50 Mg Xr24h-tab (Metoprolol succinate) .[name]... 1/2  tab by mouth once daily  Labs Reviewed: Chol: 154 (11/30/2009)   HDL: 23.80 (11/30/2009)   TG: 324.0 (11/30/2009)  Problem # 8:  OBESITY (ICD-278.00) congratulated on 20lb weight loss, rec continue plan.  has decreased starches and sodas   Ht: 69 (10/21/2009)   Wt:  318.25 (05/03/2010)   BMI: 47.72 (10/26/2009)  Problem # 9:  DYSPNEA (ICD-786.05) per patient h/o OSA on CPAP, but doesn't use machine.  will obtain records and review.  Problem # 10:  BACK PAIN (ICD-724.5) followed by NSG Botero.  s/p 3 repaired ruptured discs, to have 4 more done 05/2010.  advised i want all narcotics to be prescribed by one doctor.  Botero to continue for now.  advised may need referral to pain clinic down road.  His updated medication list for this problem includes:    Aspirin 81 Mg Tbec (Aspirin) .[name]... Take one tablet by mouth daily    Norco 10-325 Mg Tabs (Hydrocodone-acetaminophen) .Marland KitchenMarland KitchenMarland KitchenMarland Kitchen 4 daily  Problem # 11:  HIDRADENITIS (ICD-705.83) no evidence of acute infection.  obtain records, review.  may benefit from abx course.  doubt currently superinfected.  Complete Medication List: 1)  Metformin Hcl 500 Mg Tabs (Metformin hcl) .... 2 tabs by mouth two times a day 2)  Proventil Hfa 108 (90 Base) Mcg/act Aers (Albuterol sulfate) .... As directed 3)  Aspirin 81 Mg Tbec (Aspirin) .... Take one tablet by mouth daily 4)  Toprol Xl 50 Mg Xr24h-tab (Metoprolol succinate) .... 1/2  tab by mouth once daily 5)  Xanax 0.5 Mg Tabs (Alprazolam) .[name].. 1 tab by mouth two times a day 6)  Norco 10-325 Mg Tabs (Hydrocodone-acetaminophen) .... 4 daily 7)  Uloric 40 Mg Tabs (Febuxostat) .... 1/2 daily  Other Orders: Flu Vaccine 14yr + ((14970 Admin 1st Vaccine ([phone]  Patient Instructions: 1)  Return in 2 wks for follow up. 2)  release of records from Dr. MBaldomero Lamy  we will review records and blood work. 3)  release of records from Dr. BJoya Salm 4)  Call Dr. LErling Cruzfor appt. 5)  Flu shot today. 6)  Good to meet you today.  Call clinic with questions.      Immunizations Administered:  Influenza Vaccine # 1:    Vaccine Type: Fluvax 3+    Site: right deltoid    Mfr: GlaxoSmithKline    Dose: 0.5 ml    Route: IM    Given by: ALacretia Nicks   Exp. Date: 12/03/2010    Lot #:  [id number]   VIS given: 12/28/09 version given May 03, 2010.  Flu Vaccine Consent Questions:    Do you have a history of severe allergic reactions to this vaccine? no    Any prior history of allergic reactions to egg and/or gelatin? no    Do you have a sensitivity to the preservative Thimersol? no    Do you have a past history of Guillan-Barre Syndrome? no    Do you currently have an acute febrile illness? no    Have you ever had a severe reaction to latex? no    Vaccine information given and explained to patient? yes

## 2010-07-06 ENCOUNTER — Telehealth: Payer: Self-pay | Admitting: Family Medicine

## 2010-07-07 NOTE — Letter (Signed)
Summary: [facility]  [facility]   Imported By: Edmonia James 06/14/2010 11:16:11  _____________________________________________________________________  External Attachment:    Type:   Image     Comment:   External Document

## 2010-07-07 NOTE — Assessment & Plan Note (Signed)
Summary: RIB PAIN/CLE   Vital Signs:  Patient profile:   55 year old male Weight:      303.25 pounds Temp:     98.6 degrees F oral Pulse rate:   84 / minute Pulse rhythm:   regular BP sitting:   126 / 72  (left arm) Cuff size:   large  Vitals Entered By: Maudie Mercury Dance CMA Deborra Medina) (June 27, 2010 11:16 AM) CC: Follow up   History of Present Illness: CC: f/u issues  1. obesity - weight down 30+ lbs.  meat not tasting good so has increased fruits/vegetables.  has lost 13 lbs since back surgery.  + anorexia.  No NS.    2. ?hydradenitis - thinks has infection again.  no documented fevers but feeling hot in morning.  in am when awakens notices bloody sheet and bleeding from what seems to be track in scrotum.  also h/o pilonidal cyst as young adult, worried may be returning.  3. back surgery 12/14 - doing well from [place] standpoint.  increased narcotics to percocet.  receives pain meds from NSG Joya Salm) - percocet, valium, ambien.  4. charcot marie tooth - in chronic pain from this disorder - sees Dr. love, has not returned to see him, due for f/u.  told "stage 2" CMT.  5. HTN - good control likely attributed to weight loss  Current Medications (verified): 1)  Metformin Hcl 500 Mg Tabs (Metformin Hcl) .... 2 Tabs By Mouth Two Times A Day 2)  Proventil Hfa 108 (90 Base) Mcg/act Aers (Albuterol Sulfate) .... As Directed 3)  Aspirin 81 Mg Tbec (Aspirin) .... Take One Tablet By Mouth Daily 4)  Toprol Xl 50 Mg Xr24h-Tab (Metoprolol Succinate) .... 1/2  Tab By Mouth Once Daily 5)  Xanax 0.5 Mg Tabs (Alprazolam) .[name].. 1 Tab By Mouth Two Times A Day 6)  Percocet 10-325 Mg Tabs (Oxycodone-Acetaminophen) .... 6 Daily For Pain 7)  Uloric 40 Mg Tabs (Febuxostat) .... 1/2 Daily 8)  Ibuprofen 800 Mg Tabs (Ibuprofen) .[name].. 1 By Mouth Three Times A Day 9)  Valium 10 Mg Tabs (Diazepam) .[name].. 1 By Mouth Two Times A Day 10)  Ambien 10 Mg Tabs (Zolpidem Tartrate) .[name].. 1 By Mouth At Bedtime  Allergies  (verified): No Known Drug Allergies  Past History:  Past Medical History: Last updated: 06/05/2010 T2DM CAD (ICD-414.00)/MYOCARDIAL INFARCTION (ICD-410.90) x 3 (w/ stents) last 2005 HYPERLIPIDEMIA (ICD-272.4) COPD (ICD-496) CARDIOMYOPATHY (ICD-425.4) CHARCOT-MARIE-TOOTH DISEASE (ICD-356.1) [Dr. Love] GOUT (ICD-274.9) ANXIETY (ICD-300.00) Smoking Morbid obesity cervical spondylosis/DDD/lumbar herniated disk/spinal stenosis ?h/o ulcers Allergic rhinitis  NSG - Dr. Joya Salm neuro - Dr. Erling Cruz  Past Surgical History: T&A 1972 L5-S1 diskectomy Anterior C4-7 diskectomy, cervical cord decompression and B foraminotomy 01/2010 [Botero] [METAL PLATE] L2-5 laminectomy/foraminotomy for lumbar stenosis 05/2010 [Botero]  Horizon study stenting x 4 in the RCA with kissing balloon angioplasty  of the PDA ostium. (2000, 2005)     5 stents total per patient L heart cath, L ventriculography, coronary angiograpy, intravascular [place] of RCA 03/30/2005 Xray - severe stenosis L1/2 with facet arthropathy and narrowing of foramen at L4/5 & L5/S2 CT spine/myelogram - L5/S1 and L1-2  spondylosis  Review of Systems       per HPI  Physical Exam  General:  Well-developed,well-nourished,in no acute distress; alert,appropriate and cooperative throughout examination  obese. Mouth:  Oral mucosa and oropharynx without lesions or exudates.  Teeth in good repair.  MMM Neck:  No deformities, masses, or tenderness noted. Lungs:  Normal respiratory effort, chest  expands symmetrically. Lungs are clear to auscultation, no crackles or wheezes. Heart:  Normal rate and regular rhythm. S1 and S2 normal without gallop, murmur, click, rub or other extra sounds. Rectal:  gluteal crease without opening, absecss or lesions Genitalia:  Testes bilaterally descended without nodularity, tenderness or masses. + R scrotal mass consistent with likely varicocele (s/p [place] by [facility] per patient).  No urethral discharge.  +  small pustule dorsal penile shaft + scrotum with indurations and nodular thickened skin, no erythema.  + opening R inferior scrotum with what appears to be sinus track formation into abscess.  + perineal area with induration and thickened skin, chronic hyperpigmentation.  + perianal region with thick papules/pustules.   Pulses:  2+ rad/DP/PT pulses, brisk cap refill Extremities:  + nonpitting edema bilateral legs Neurologic:  sensation diminished B feet.  CN grossly intact, station and gait intact.   Skin:  see genital exam   Impression & Recommendations:  Problem # 1:  HIDRADENITIS (ICD-705.83) groin skin lesions most consistent with this dx.  treat for now with 10 d course doxycycline.  If not improving, or tract continues to drain after treatment, referral to gen surgery for evaluation.  will also obtain scrotal US to better evaluate possible sinus tract.  Orders: TLB-CBC Platelet - w/Differential (85025-CBCD) Radiology Referral (Radiology)  Problem # 2:  DIABETES MELLITUS (ICD-250.00) check blood work today.  still awaiting records from Dr. Baldomero Lamy.  His updated medication list for this problem includes:    Metformin Hcl 500 Mg Tabs (Metformin hcl) .[name]... 2 tabs by mouth two times a day    Aspirin 81 Mg Tbec (Aspirin) .[name]... Take one tablet by mouth daily  Orders: TLB-A1C / Hgb A1C (Glycohemoglobin) (83036-A1C)  Problem # 3:  OBESITY (ICD-278.00) 30+ lb weight loss in last few months.    Orders: TLB-TSH (Thyroid Stimulating Hormone) (84443-TSH)  Ht: 69 (10/21/2009)   Wt: 303.25 (06/27/2010)   BMI: 47.72 (10/26/2009)  Problem # 4:  HYPERTENSION (ICD-401.9) good control, likely attributed to weight loss.  His updated medication list for this problem includes:    Toprol Xl 50 Mg Xr24h-tab (Metoprolol succinate) .[name]... 1/2  tab by mouth once daily  Orders: TLB-BMP (Basic Metabolic Panel-BMET) ([id number])  BP today: 126/72 Prior BP: 146/80 (05/16/2010)  Labs  Reviewed: Chol: 154 (11/30/2009)   HDL: 23.80 (11/30/2009)   TG: 324.0 (11/30/2009)  Problem # 5:  TOBACCO ABUSE (ICD-305.1)  Pt counseled on discontinuing.  needs to quit 2/2 diabetets and hydradentiis.  Complete Medication List: 1)  Metformin Hcl 500 Mg Tabs (Metformin hcl) .... 2 tabs by mouth two times a day 2)  Proventil Hfa 108 (90 Base) Mcg/act Aers (Albuterol sulfate) .... As directed 3)  Aspirin 81 Mg Tbec (Aspirin) .... Take one tablet by mouth daily 4)  Toprol Xl 50 Mg Xr24h-tab (Metoprolol succinate) .... 1/2  tab by mouth once daily 5)  Xanax 0.5 Mg Tabs (Alprazolam) .[name].. 1 tab by mouth two times a day 6)  Percocet 10-325 Mg Tabs (Oxycodone-acetaminophen) .... 6 daily for pain 7)  Uloric 40 Mg Tabs (Febuxostat) .... 1/2 daily 8)  Ibuprofen 800 Mg Tabs (Ibuprofen) .[name].. 1 by mouth three times a day 9)  Valium 10 Mg Tabs (Diazepam) .[name].. 1 by mouth two times a day 10)  Ambien 10 Mg Tabs (Zolpidem tartrate) .[name].. 1 by mouth at bedtime 11)  Doxycycline Hyclate 100 Mg Caps (Doxycycline hyclate) .... Take one by mouth two times a day x 10 days  Other Orders: TLB-Hepatic/Liver Function Pnl (80076-HEPATIC) TLB-Lipid Panel (80061-LIPID)  Patient Instructions: 1)  Return in 1 month for follow up. 2)  blood work today. 3)  Check when last complete physical was and we will set you up with one based on last time.  Call me. 4)  Go to medical equipment supply store.  Soak in warm epsom salt baths twice a day.  warm compresses during day as well. 5)  Call clinic with questions Prescriptions: DOXYCYCLINE HYCLATE 100 MG CAPS (DOXYCYCLINE HYCLATE) take one by mouth two times a day x 10 days  #20 x 0   Entered and Authorized by:   Ria Bush  MD   Signed by:   Ria Bush  MD on 06/28/2010   Method used:   Electronically to        [facility]  [id number] (retail)       [place], [place]  [address]       Ph: [phone] or [phone]       Fax:  [phone]   RxID:   [id number]    Orders Added: 1)  TLB-BMP (Basic Metabolic Panel-BMET) [[id number]] 2)  TLB-CBC Platelet - w/Differential [[id number]] 3)  TLB-Hepatic/Liver Function Pnl [80076-HEPATIC] 4)  TLB-TSH (Thyroid Stimulating Hormone) [84443-TSH] 5)  TLB-Lipid Panel [[id number]] 6)  TLB-A1C / Hgb A1C (Glycohemoglobin) [83036-A1C] 7)  Est. Patient Level IV [74259] 8)  Radiology Referral [Radiology]    Current Allergies (reviewed today): No known allergies   Appended Document: RIB PAIN/CLE  will need to notify pt abx sent as well as importance of smoking cessation and how I want to obtain scrotal US.  tried to call, no answer, left message to call us back.  will try later. Ria Bush  MD  June 28, 2010 1:25 PM   Clinical Lists Changes

## 2010-07-07 NOTE — Assessment & Plan Note (Signed)
Summary: ROA FOR FOLLOW-UP/JRR   Vital Signs:  Patient profile:   55 year old male Weight:      316.75 pounds Temp:     98.7 degrees F oral Pulse rate:   86 / minute Pulse rhythm:   regular BP sitting:   146 / 80  (left arm) Cuff size:   large  Vitals Entered By: Maudie Mercury Dance CMA Deborra Medina) (May 16, 2010 4:05 PM) CC: Follow up   History of Present Illness: CC: f/u visit  Surgery on Wednesday by Dr. Joya Salm for 4 disks in back.  Hopeful to help his back pain.  had preop clearance yesterday.  OSA - supposed to be on CPAP but doesn't take.  Has gone through [facility] in [place] (who recently accidentally sent him copy of 20 ppl's charts).[name]  HTN - doing well.  no HA, vision changes, chest pain, tightness, urinary changes, LE swelling.  + SOB the other day, resolved with nebulizer.  smoking - infrequent cigarettes, 6-7 pipes per day  groin skin condition (presumed hydradenitis) - doing well, wife washes regularly.  bleeding occasionally, no erythema, pain  Current Medications (verified): 1)  Metformin Hcl 500 Mg Tabs (Metformin Hcl) .... 2 Tabs By Mouth Two Times A Day 2)  Proventil Hfa 108 (90 Base) Mcg/act Aers (Albuterol Sulfate) .... As Directed 3)  Aspirin 81 Mg Tbec (Aspirin) .... Take One Tablet By Mouth Daily 4)  Toprol Xl 50 Mg Xr24h-Tab (Metoprolol Succinate) .... 1/2  Tab By Mouth Once Daily 5)  Xanax 0.5 Mg Tabs (Alprazolam) .[name].. 1 Tab By Mouth Two Times A Day 6)  Norco 10-325 Mg Tabs (Hydrocodone-Acetaminophen) .... 4 Daily 7)  Uloric 40 Mg Tabs (Febuxostat) .... 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 Love] GOUT (ICD-274.9) ANXIETY (ICD-300.00) Smoking Arthritis h/o  ruptured discs in back [Dr. Botero] ?h/o ulcers Allergic rhinitis  Social History: Last updated: 05/03/2010 Smoking 1/2 ppd, non-drinker, no rec drug abuse caffeine: 2 cups coffee, 2 cups soda Occupation: Neurosurgeon implants, crowne and bridge, now disability 2006 Lives with wife, 1 dog, no children 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 x 5 years  Review of Systems       per HPI  Physical Exam  General:  Well-developed,well-nourished,in no acute distress; alert,appropriate and cooperative throughout examination  obese. Lungs:  Normal respiratory effort, chest expands symmetrically. Lungs are clear to auscultation, no crackles or wheezes. Heart:  Normal rate and regular rhythm. S1 and S2 normal without gallop, murmur, click, rub or other extra sounds. Genitalia:  Testes bilaterally descended without nodularity, tenderness or masses. + R scrotal mass consistent with likely varicocele (s/p [place] by [facility] per patient).  No penis lesions or urethral discharge.  + scrotum with indurations and nodular thickened skin, no erythema.  + perineal area with induration and thickened skin, chronic hyperpigmentation actually improved pigmentation from last visit Skin:  see genital exam   Impression & Recommendations:  Problem # 1:  OBESITY (ICD-278.00) congratulated on 2 lb weight loss total of  ~22lbs  Ht: 69 (10/21/2009)   Wt: 316.75 (05/16/2010)   BMI: 47.72 (10/26/2009)  Problem # 2:  HYPERTENSION (ICD-401.9) somewhat high today.  last check stable.  continue to monitor. His updated medication list for this problem includes:    Toprol Xl 50 Mg Xr24h-tab (Metoprolol succinate) .Marland KitchenMarland KitchenMarland KitchenMarland Kitchen  1/2  tab by mouth once daily  BP today: 146/80 Prior BP: 118/80 (05/03/2010)  Labs Reviewed: Chol: 154 (11/30/2009)   HDL: 23.80 (11/30/2009)   TG: 324.0 (11/30/2009)  Problem # 3:  HIDRADENITIS (ICD-705.83) no evidence of infection today.  monitor, stable.  consider derm referral for  certain dx vs course of abx for inflammation control.  Problem # 4:  OBSTRUCTIVE SLEEP APNEA (ICD-327.23) encouraged complaince with CPAP, pt precontemplative about this.  when surgery completed, will likely recommend f/u with pulm for further eval of OSA.  weight loss helping.  Problem # 5:  TOBACCO ABUSE (ICD-305.1)  Pt counseled on discontinuing  Complete Medication List: 1)  Metformin Hcl 500 Mg Tabs (Metformin hcl) .... 2 tabs by mouth two times a day 2)  Proventil Hfa 108 (90 Base) Mcg/act Aers (Albuterol sulfate) .... As directed 3)  Aspirin 81 Mg Tbec (Aspirin) .... Take one tablet by mouth daily 4)  Toprol Xl 50 Mg Xr24h-tab (Metoprolol succinate) .... 1/2  tab by mouth once daily 5)  Xanax 0.5 Mg Tabs (Alprazolam) .[name].. 1 tab by mouth two times a day 6)  Norco 10-325 Mg Tabs (Hydrocodone-acetaminophen) .... 4 daily 7)  Uloric 40 Mg Tabs (Febuxostat) .... 1/2 daily  Patient Instructions: 1)  Good to see you today, call clinic with questions 2)  F/u in 1 month (hopefully we will have records by then) 3)  When you return, try to come fasting for blood work. 4)  Remember just one person should be scheduling your narcotic meds. 5)  Call us with questions.   Orders Added: 1)  Est. Patient Level IV [38177]    Current Allergies (reviewed today): No known allergies

## 2010-07-08 ENCOUNTER — Encounter: Payer: Self-pay | Admitting: Family Medicine

## 2010-07-08 ENCOUNTER — Telehealth: Payer: Self-pay | Admitting: Family Medicine

## 2010-07-08 ENCOUNTER — Other Ambulatory Visit: Payer: Self-pay | Admitting: Internal Medicine

## 2010-07-08 ENCOUNTER — Ambulatory Visit
Admission: RE | Admit: 2010-07-08 | Discharge: 2010-07-08 | Disposition: A | Discharge status: Home / Self Care | Payer: Medicare PPO | Source: Ambulatory Visit

## 2010-07-08 DIAGNOSIS — L732 Hidradenitis suppurativa: Secondary | ICD-10-CM

## 2010-07-08 DIAGNOSIS — R52 Pain, unspecified: Secondary | ICD-10-CM

## 2010-07-13 NOTE — Progress Notes (Signed)
Summary: please call with ultrasound results  Phone Note Call from Patient Call back at Home Phone [phone]   Caller: Archer Asa  Call For: Ria Bush  MD Summary of Call: Please review pts scrotal ultrasound and call pt's wife with report.  They are very anxious for results Initial call taken by: Marty Heck CMA, AAMA,  July 08, 2010 4:59 PM  Follow-up for Phone Call        discussed results with patient.  see addendum to [place]. Follow-up by: Ria Bush  MD,  July 08, 2010 6:00 PM

## 2010-07-13 NOTE — Progress Notes (Signed)
Summary: refill request for [name], xanax  Phone Note Refill Request Message from:  Patient  Refills Requested: Medication #1:  XANAX 0.5 MG TABS 1 tab by mouth two times a day  Medication #2:  AMBIEN 10 MG TABS 1 by mouth at bedtime Please send to [facility].  Initial call taken by: Marty Heck CMA, AAMA,  July 06, 2010 12:03 PM  Follow-up for Phone Call        i have not prescribed these in past.  who was prescribing? Follow-up by: Ria Bush  MD,  July 06, 2010 1:34 PM  Additional Follow-up for Phone Call Additional follow up Details #1::        Dr Abbott Pao was the one that has been prescribing Rx's. Additional Follow-up by: Arnetha Courser CMA Deborra Medina),  July 06, 2010 3:38 PM    Additional Follow-up for Phone Call Additional follow up Details #2::    may refill x 1 month, will want pt to return to discuss these meds as I don't agree with long term prescription.  have him come in next month to discuss meds. Follow-up by: Ria Bush  MD,  July 06, 2010 4:49 PM  Additional Follow-up for Phone Call Additional follow up Details #3:: Details for Additional Follow-up Action Taken: Rx called in as directed. Patient notified and follow up scheduled for 08-04-10.  Additional Follow-up by: Arnetha Courser CMA ([place]),  July 07, 2010 8:22 AM  Prescriptions: AMBIEN 10 MG TABS (ZOLPIDEM TARTRATE) 1 by mouth at bedtime  #30 x 0   Entered and Authorized by:   Ria Bush  MD   Signed by:   Ria Bush  MD on 07/06/2010   Method used:   Telephoned to ...       [facility] (retail[facility]       [place], [place]  [address]       Ph: [phone] or [phone]       Fax: [phone]   RxID:   [id number] XANAX 0.5 MG TABS (ALPRAZOLAM) 1 tab by mouth two times a day  #60 x 0   Entered and Authorized by:   Ria Bush  MD   Signed by:   Ria Bush  MD on 07/06/2010   Method used:   Telephoned to ...       [facility] (retail[facility]       [address]       [place], [place]  [address]       Ph: [phone] or [phone]       Fax: [phone]   RxID:   [id number]

## 2010-07-19 ENCOUNTER — Ambulatory Visit (INDEPENDENT_AMBULATORY_CARE_PROVIDER_SITE_OTHER): Payer: Medicare PPO | Admitting: Family Medicine

## 2010-07-19 ENCOUNTER — Encounter: Payer: Self-pay | Admitting: Family Medicine

## 2010-07-19 DIAGNOSIS — F17200 Nicotine dependence, unspecified, uncomplicated: Secondary | ICD-10-CM

## 2010-07-19 DIAGNOSIS — E119 Type 2 diabetes mellitus without complications: Secondary | ICD-10-CM

## 2010-07-19 DIAGNOSIS — L732 Hidradenitis suppurativa: Secondary | ICD-10-CM

## 2010-07-19 DIAGNOSIS — I2510 Atherosclerotic heart disease of native coronary artery without angina pectoris: Secondary | ICD-10-CM

## 2010-07-19 DIAGNOSIS — E669 Obesity, unspecified: Secondary | ICD-10-CM

## 2010-07-27 ENCOUNTER — Telehealth: Payer: Self-pay | Admitting: Family Medicine

## 2010-07-27 ENCOUNTER — Ambulatory Visit: Payer: Self-pay | Admitting: Family Medicine

## 2010-07-27 NOTE — Assessment & Plan Note (Signed)
Summary: ONE MTH FOLLOW UP / LFW   Vital Signs:  Patient profile:   55 year old male Weight:      307.75 pounds Temp:     98.2 degrees F oral Pulse rate:   88 / minute Pulse rhythm:   regular BP sitting:   136 / 80  (left arm) Cuff size:   large  Vitals Entered By: Maudie Mercury Dance CMA Deborra Medina) (July 19, 2010 11:42 AM) CC: 1 month follow up Comments Patient states scrotal area is worse   History of Present Illness: CC: f/u hydradenitis  54yo with history including Charcot-Marie-Tooth and multiple back surgeries last 05/2010 for DDD and spinal stenosis, as well as T2DM, CAD s/p stents and continued smoking, gout and morbid obesity and recent dx hydradenitis presents as f/u groin and other medical problems.  1. hydradenitis - Thinks swelling is worse as well as inflammation.  Bleeding intermittently as well.  Using sitz baths with epsom salt.  Also tried water with peroxide and mupirocin cream.  This has been going on for 1+ year.  Finished Doxycycline 10 day course a few days ago.  also has recurrent boils under arms.  Thinks mother with similar sxs.  No fevers/chlils, nausea/vomiting, abd pain.  s/p scrotal US to eval for sinus tract but they did not comment on this.    2. DM - self backed down metformin to 1070m in am.  checks sugars intermittently and havent been higher than 150s.  A1c 6.1% 06/2010.    3. gout - prefers to stay on uloric for now.  was previously on allopurinol and colchicine, uloric works better   4. smoking - 1/2 ppd still, precontemplative.  5. chronic pain - oxycodone too strong.  has been on vicodin long term (15 + years).  meds per neurosurgery.  Current Medications (verified): 1)  Metformin Hcl 500 Mg Tabs (Metformin Hcl) .... 2 Tabs By Mouth Daily A Day 2)  Proventil Hfa 108 (90 Base) Mcg/act Aers (Albuterol Sulfate) .... As Directed 3)  Aspirin 81 Mg Tbec (Aspirin) .... Take One Tablet By Mouth Daily 4)  Toprol Xl 50 Mg Xr24h-Tab (Metoprolol Succinate)  .... 1/2  Tab By Mouth Once Daily 5)  Xanax 0.5 Mg Tabs (Alprazolam) ..[name]. 1 Tab By Mouth Two Times A Day 6)  Percocet 10-325 Mg Tabs (Oxycodone-Acetaminophen) .... 6 Daily For Pain 7)  Uloric 40 Mg Tabs (Febuxostat) .... 1/2 Daily 8)  Ibuprofen 800 Mg Tabs (Ibuprofen) ..[name]. 1 By Mouth Three Times A Day 9)  Valium 10 Mg Tabs (Diazepam) ..[name]. 1 By Mouth Two Times A Day 10)  Ambien 10 Mg Tabs (Zolpidem Tartrate) ..[name]. 1 By Mouth At Bedtime  Allergies (verified): No Known Drug Allergies  Past History:  Past Surgical History: Last updated: 07/08/2010 T&A 1972 L5-S1 diskectomy Anterior C4-7 diskectomy, cervical cord decompression and B foraminotomy 01/2010 [Botero] [METAL PLATE] L2-5 laminectomy/foraminotomy for lumbar stenosis 05/2010 [Botero]  Horizon study stenting x 4 in the RCA with kissing balloon angioplasty  of the PDA ostium. (2000, 2005)     5 stents total per patient L heart cath, L ventriculography, coronary angiograpy, intravascular [place]of RCA 03/30/2005 Xray - severe stenosis L1/2 with facet arthropathy and narrowing of foramen at L4/5 & L5/S2 CT spine/myelogram - L5/S1 and L1-2  spondylosis scrotal [place] hydradenitis - testes WNL, small R epididymal cyst, small L varicocele, no mention of sinus tract 07/2010.  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 Joya Salm neuro - Dr. Erling Cruz cards - Dr. Stanford Breed  Social History: Smoking 1/2 ppd as well as cigars, non-drinker, no rec drug abuse caffeine: 2 cups coffee, 2 cups  soda Occupation: Neurosurgeon implants, crowne and bridge, now disability 2006 Lives with wife, 1 dog, no children 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 x 5 years  Review of Systems       per HPI  Physical Exam  General:  Well-developed,well-nourished,in no acute distress; alert,appropriate and cooperative throughout examination  obese. Mouth:  Oral mucosa and oropharynx without lesions or exudates.  Teeth in good repair.  MMM Neck:  No deformities, masses, or tenderness noted. Lungs:  Normal respiratory effort, chest expands symmetrically. Lungs are clear to auscultation, no crackles or wheezes. Heart:  Normal rate and regular rhythm. S1 and S2 normal without gallop, murmur, click, rub or other extra sounds.  distant heart sounds Rectal:  gluteal crease without opening, abscess or lesions Genitalia:  Testes bilaterally descended without nodularity, tenderness or masses. + R scrotal mass (varicocele)  No urethral discharge.  scrotum as well as upper thigh region with indurations and nodular thickened skin, actually decreased erythema.  previous opening R inferior scrotum smaller and seems to be closing but able to express small amt serosanguinous discharge from it.  perineal area with induration and thickened skin, chronic hyperpigmentation.  perianal region with thick papules/pustules.   Pulses:  2+ rad/DP/PT pulses, brisk cap refill Extremities:  + nonpitting edema bilateral legs. L axilla with chronically thickened skin   Impression & Recommendations:  Problem # 1:  HIDRADENITIS (ICD-705.83) s/p scrotal US without mention of sinus tract.  s/p 10 d doxy course and daily sitz baths.  inflammation/erythema actually improved.  however pt and wife want to know what other treatment is available for hydradenitis.  will refer to surgery for second opinion.  Orders: Surgical Referral (Surgery)  Problem # 2:  DIABETES MELLITUS (ICD-250.00) last A1c 6.1% (after significant weight loss).   may back down on metformin to 1081m daily.  advised keep track of sugasr and update me if creeping upwards.  His updated medication list for this problem includes:    Metformin Hcl 500 Mg Tabs (Metformin hcl) ..[name].. 2 tabs by mouth daily a day    Aspirin 81 Mg Tbec (Aspirin) ..[name].. Take one tablet by mouth daily  Labs Reviewed: Creat: 1.0 (06/27/2010)    Reviewed HgBA1c results: 6.1 (06/27/2010)  6.9 (02/02/2010)  Problem # 3:  OBSTRUCTIVE SLEEP APNEA (ICD-327.23) recommended compliance with CPAP.  may need referral to pulm down road for formal sleep study (pt states this has not been done).  Problem # 4:  OBESITY (ICD-278.00) 4 lbs up since last visit, but prior had significant weight loss in last several months.  Ht: 69 (10/21/2009)   Wt: 307.75 (07/19/2010)   BMI: 47.72 (10/26/2009)  Problem # 5:  GOUT (ICD-274.9) per patient, tried allopurinol and colchicine in past, will continue uloric for now.  using online coupon for discount.  His updated medication list for this problem includes:    Uloric 40 Mg Tabs (Febuxostat) ..[name].. 1/2 daily    Ibuprofen 800 Mg Tabs (Ibuprofen) ..[name].. 1 by mouth three times a day  Problem #  6:  TOBACCO ABUSE (ICD-305.1) encouraged cessation, discussed implications of this and hydradenitis/poor skin healing.  Problem # 7:  CAD (ICD-414.00)  due for cards visit, rec set up.  s/p MI x 3 and stents x5 per patient.  His updated medication list for this problem includes:    Aspirin 81 Mg Tbec (Aspirin) .[name]... Take one tablet by mouth daily    Toprol Xl 50 Mg Xr24h-tab (Metoprolol succinate) .[name]... 1/2  tab by mouth once daily  Labs Reviewed: Chol: 184 (06/27/2010)   HDL: 25.30 (06/27/2010)   LDL: 68 (02/02/2010)   TG: 237.0 (06/27/2010)  Problem # 8:  HYPERTENSION (ICD-401.9) continue med.   His updated medication list for this problem includes:    Toprol Xl 50 Mg Xr24h-tab (Metoprolol succinate) .[name]... 1/2  tab by mouth once daily  BP today:  136/80 Prior BP: 126/72 (06/27/2010)  Labs Reviewed: K+: 5.0 (06/27/2010) Creat: : 1.0 (06/27/2010)   Chol: 184 (06/27/2010)   HDL: 25.30 (06/27/2010)   LDL: 68 (02/02/2010)   TG: 237.0 (06/27/2010)  Complete Medication List: 1)  Metformin Hcl 500 Mg Tabs (Metformin hcl) .... 2 tabs by mouth daily a day 2)  Proventil Hfa 108 (90 Base) Mcg/act Aers (Albuterol sulfate) .... As directed 3)  Aspirin 81 Mg Tbec (Aspirin) .... Take one tablet by mouth daily 4)  Toprol Xl 50 Mg Xr24h-tab (Metoprolol succinate) .... 1/2  tab by mouth once daily 5)  Xanax 0.5 Mg Tabs (Alprazolam) .[name].. 1 tab by mouth two times a day 6)  Percocet 10-325 Mg Tabs (Oxycodone-acetaminophen) .... 6 daily for pain 7)  Uloric 40 Mg Tabs (Febuxostat) .... 1/2 daily 8)  Ibuprofen 800 Mg Tabs (Ibuprofen) .[name].. 1 by mouth three times a day 9)  Valium 10 Mg Tabs (Diazepam) .[name].. 1 by mouth two times a day 10)  Ambien 10 Mg Tabs (Zolpidem tartrate) .[name].. 1 by mouth at bedtime  Patient Instructions: 1)  pass by [facility]'s office for appointment with surgery. 2)  ok to back down on metformin to once daily. 3)  Set up complete physical for summer/fall. 4)  continue to cut back on smoking. 5)  Call clinic with quesitons, set up appointment with cardiology next month and Dr. Erling Cruz. 6)  return to see me in 3 months.   Orders Added: 1)  Surgical Referral [Surgery] 2)  Est. Patient Level IV [50518]    Current Allergies (reviewed today): No known allergies

## 2010-07-28 ENCOUNTER — Ambulatory Visit: Payer: Self-pay | Admitting: Family Medicine

## 2010-08-01 ENCOUNTER — Telehealth: Payer: Self-pay | Admitting: Family Medicine

## 2010-08-02 ENCOUNTER — Ambulatory Visit: Payer: Medicare PPO | Admitting: Family Medicine

## 2010-08-02 ENCOUNTER — Encounter: Payer: Self-pay | Admitting: Family Medicine

## 2010-08-02 ENCOUNTER — Other Ambulatory Visit: Payer: Self-pay | Admitting: Family Medicine

## 2010-08-02 ENCOUNTER — Ambulatory Visit (INDEPENDENT_AMBULATORY_CARE_PROVIDER_SITE_OTHER): Payer: Medicare PPO | Admitting: Family Medicine

## 2010-08-02 DIAGNOSIS — L732 Hidradenitis suppurativa: Secondary | ICD-10-CM

## 2010-08-02 DIAGNOSIS — R439 Unspecified disturbances of smell and taste: Secondary | ICD-10-CM

## 2010-08-02 DIAGNOSIS — R634 Abnormal weight loss: Secondary | ICD-10-CM | POA: Insufficient documentation

## 2010-08-02 DIAGNOSIS — E559 Vitamin D deficiency, unspecified: Secondary | ICD-10-CM

## 2010-08-02 LAB — B12 AND FOLATE PANEL: Vitamin B-12: 275 pg/mL (ref 211–911)

## 2010-08-02 NOTE — Progress Notes (Signed)
Summary: boils   Phone Note Call from Patient Call back at Home Phone [phone]   Caller: Spouse- [name] Call For: Miguel Bush  MD Summary of Call: Wife called very upset and crying about the appt he has with the surgeon. She says that they had to pay $50 that they didn't have for the surgeon to write a rx for bactrum and tell him that he needs to see a Psychiatric nurse. Wife says that they would do a skin graph and that he would be bed ridden for 3 months and that he would die because he can't lay in bed for 3 months with his muscle disease. She says that he is in so much pain that he can barely walk. She is asking what she can do for him now. Please advise.  Initial call taken by: Lacretia Nicks,  July 27, 2010 4:59 PM  Follow-up for Phone Call        Saw Dr. Brantley Stage.  Looked at pt and told him that needed to have plastic surgery, skin grafts and lay in bed 3 months.  also given script for bactrim.  Asks about MRSA.  Could have.  recommend take antibiotics prescribed and I will await note from Dr. Brantley Stage.  Doxycycline made mouth raw. Follow-up by: Miguel Bush  MD,  July 28, 2010 5:27 PM

## 2010-08-04 ENCOUNTER — Ambulatory Visit: Payer: Self-pay | Admitting: Family Medicine

## 2010-08-05 DIAGNOSIS — E538 Deficiency of other specified B group vitamins: Secondary | ICD-10-CM | POA: Insufficient documentation

## 2010-08-05 LAB — CONVERTED CEMR LAB: Vit D, 25-Hydroxy: 21 ng/mL — ABNORMAL LOW (ref 30–89)

## 2010-08-10 ENCOUNTER — Encounter: Payer: Self-pay | Admitting: Family Medicine

## 2010-08-10 ENCOUNTER — Ambulatory Visit (INDEPENDENT_AMBULATORY_CARE_PROVIDER_SITE_OTHER): Payer: Medicare PPO

## 2010-08-10 DIAGNOSIS — E538 Deficiency of other specified B group vitamins: Secondary | ICD-10-CM

## 2010-08-11 NOTE — Progress Notes (Signed)
Summary: xanax   Phone Note Refill Request Message from:  Fax from Pharmacy on August 01, 2010 10:38 AM  Refills Requested: Medication #1:  XANAX 0.5 MG TABS 1 tab by mouth two times a day   Last Refilled: 07/07/2010  Medication #2:  AMBIEN 10 MG TABS 1 by mouth at bedtime.   Last Refilled: 07/07/2010 [facility] [phone]  Initial call taken by: Lacretia Nicks,  August 01, 2010 10:39 AM  Follow-up for Phone Call        ok to refill.  please call in. Follow-up by: Ria Bush  MD,  August 01, 2010 1:13 PM  Additional Follow-up for Phone Call Additional follow up Details #1::        Rx called in as directed.  Additional Follow-up by: Arnetha Courser CMA Deborra Medina),  August 01, 2010 2:49 PM    Prescriptions: XANAX 0.5 MG TABS (ALPRAZOLAM) 1 tab by mouth two times a day  #60 x 0   Entered and Authorized by:   Ria Bush  MD   Signed by:   Ria Bush  MD on 08/01/2010   Method used:   Telephoned to ...       [facility] [place] [facility]       [place], [place]  [address]       Ph: [phone] or [phone]       Fax: [phone]   RxID:   [id number] Duanne Moron 0.5 MG TABS (ALPRAZOLAM) 1 tab by mouth two times a day  #60 x 0   Entered and Authorized by:   Ria Bush  MD   Signed by:   Ria Bush  MD on 08/01/2010   Method used:   Print then Give to Patient   RxID:   [id number]

## 2010-08-11 NOTE — Assessment & Plan Note (Signed)
Summary: F/U / LFW   Vital Signs:  Patient profile:   55 year old male Height:      69 inches Weight:      299.50 pounds BMI:     44.39 Temp:     98.6 degrees F oral Pulse rate:   86 / minute Pulse rhythm:   regular BP sitting:   138 / 90  (left arm) Cuff size:   large  Vitals Entered By: Maudie Mercury Dance CMA Deborra Medina) (August 02, 2010 9:27 AM) CC: Follow up   History of Present Illness: CC: f/u issues  loss of taste - especially with meats, salty foods.  Going on since neck surgery August 4th.  gets metallic taste in mouth.  Decreased appetite.  Yogurt, ice cream, sweets, pasta ok.  has also had significant weight loss since surgeries.  (down to 299lbs).  Never had stomach surgeries.  no pica.  last B12 07/2009 normal.  vit D low but on replacement.  hasn't had iron studies done recently.  recent TSH 1.0 (06/2010).  hydradenitis - seen by surgery, placed on bactrim, rec f/u with plastic surgery for skin graft.  pt upset because had to pay $50 copay for this advice, doesn't want surgery, doesn't think could be able to tolerate prolonged recovery period.    had CXR 05/18/2010.  due to see Crenshaw and Love, will try and schedule with Love in next week. Still smoking 1/2 ppd. colonscopy 2 years ago.  says told normal, had done by Juanita Craver.  Preventive Screening-Counseling & Management  Alcohol-Tobacco     Smoking Status: current  Current Medications (verified): 1)  Metformin Hcl 500 Mg Tabs (Metformin Hcl) .... 2 Tabs By Mouth Daily A Day 2)  Proventil Hfa 108 (90 Base) Mcg/act Aers (Albuterol Sulfate) .... As Directed 3)  Aspirin 81 Mg Tbec (Aspirin) .... Take One Tablet By Mouth Daily 4)  Toprol Xl 50 Mg Xr24h-Tab (Metoprolol Succinate) .... 1/2  Tab By Mouth Once Daily 5)  Xanax 0.5 Mg Tabs (Alprazolam) .[name].. 1 Tab By Mouth Two Times A Day 6)  Uloric 40 Mg Tabs (Febuxostat) .... 1/2 Daily 7)  Ibuprofen 800 Mg Tabs (Ibuprofen) .[name].. 1 By Mouth Three Times A Day 8)  Valium 10 Mg  Tabs (Diazepam) .[name].. 1 By Mouth Two Times A Day 9)  Ambien 10 Mg Tabs (Zolpidem Tartrate) .[name].. 1 By Mouth At Bedtime 10)  Hydrocodone-Acetaminophen 10-325 Mg Tabs (Hydrocodone-Acetaminophen) .[name].. 1 By Mouth Every 4 Hours As Needed For Pain  Allergies (verified): No Known Drug Allergies  Past History:  Past Surgical History: Last updated: 07/08/2010 T&A 1972 L5-S1 diskectomy Anterior C4-7 diskectomy, cervical cord decompression and B foraminotomy 01/2010 [Botero] [METAL PLATE] L2-5 laminectomy/foraminotomy for lumbar stenosis 05/2010 [Botero]  Horizon study stenting x 4 in the RCA with kissing balloon angioplasty  of the PDA ostium. (2000, 2005)     5 stents total per patient L heart cath, L ventriculography, coronary angiograpy, intravascular [place] of RCA 03/30/2005 Xray - severe stenosis L1/2 with facet arthropathy and narrowing of foramen at L4/5 & L5/S2 CT spine/myelogram - L5/S1 and L1-2  spondylosis scrotal US for hydradenitis - testes WNL, small R epididymal cyst, small L varicocele, no mention of sinus tract 07/2010.  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 Joya Salm neuro - Dr. Erling Cruz cards - Dr. Stanford Breed  Social History: Smoking  Status:  current  Review of Systems       per HPI  Physical Exam  General:  Well-developed,well-nourished,in no acute distress; alert,appropriate and cooperative throughout examination  obese.  noted weight loss Mouth:  Oral mucosa and oropharynx without lesions or exudates.  Teeth in good repair.  MMM Neck:  No deformities, masses, or tenderness noted.  no bruits, no LAD Lungs:  Normal respiratory effort, chest expands symmetrically. Lungs are clear to auscultation, no crackles or wheezes. Heart:  Normal rate and regular rhythm. S1 and S2 normal without gallop, murmur, click, rub or other extra sounds.  distant heart sounds Abdomen:  Bowel sounds positive,abdomen soft and non-tender without masses, organomegaly or hernias noted. Genitalia:  Testes bilaterally descended without nodularity, tenderness or masses. + R scrotal mass (varicocele)  No urethral discharge.  scrotum as well as upper thigh region with indurations and nodular thickened skin, no erythema. perineal area with induration and thickened skin, chronic hyperpigmentation.  perianal region with thick papules/pustules.   Pulses:  2+ rad/DP/PT pulses, brisk cap refill Skin:  see genital exam   Impression & Recommendations:  Problem # 1:  HIDRADENITIS (ICD-705.83) discussed options of continued meidcal managmenet (trying to control inflammation and recurrent sxs) vs surgery.  pt doesn't thinks would tolerate surgery with 73moprolonged recovery.  asked to check with Dr. LErling Cruzre surgery option.  finish abx course.  provided with hydradenitis handout from [facility].  Problem # 2:  VITAMIN D DEFICIENCY (ICD-268.9) recheck today.  states has been supplementing Orders: T-Vitamin D (25-Hydroxy) [phone]  Problem # 3:  DISTURBANCES OF SENSATION OF SMELL AND TASTE (ICD-781.1) check blood work including vitamin levels.  no other sxs of vitamin def.  no cheilitis.  Orders: Venipuncture (([id number] TLB-B12 + Folate Pnl  (([id number]/EUM T- * Misc. Laboratory test [phone]  Problem # 4:  DIABETES MELLITUS (ICD-250.00) stable on 10031mmetformin in pm.   His updated medication list for this problem includes:    Metformin Hcl 500 Mg Tabs (Metformin hcl) ...[name]. 2 tabs by mouth daily    Aspirin 81 Mg Tbec (Aspirin) ...[name]. Take one tablet by mouth daily  Labs Reviewed: Creat: 1.0 (06/27/2010)    Reviewed HgBA1c results: 6.1 (06/27/2010)  6.9 (02/02/2010)  Problem # 5:  WEIGHT LOSS, RECENT (ICD-783.21) Assessment: New reviewed weights, has been consistently dropping.  colonsocopy per report UTD and normal, will request records.  still smoking but CXR last year WNL per report.  due for CPE.  CBC last checked as well as CMP WNL and TSH WNL.    Complete Medication List: 1)  Metformin Hcl 500 Mg Tabs (Metformin hcl) .... 2 tabs by mouth daily 2)  Proventil Hfa 108 (90 Base) Mcg/act Aers (Albuterol sulfate) .... As directed 3)  Aspirin 81 Mg Tbec (Aspirin) .... Take one tablet by mouth daily 4)  Toprol Xl 50 Mg Xr24h-tab (Metoprolol succinate) .... 1/2  tab by mouth once daily 5)  Xanax 0.5 Mg Tabs (Alprazolam) ...[name] 1 tab by mouth two times a  day 6)  Uloric 40 Mg Tabs (Febuxostat) .... 1/2 daily 7)  Ibuprofen 800 Mg Tabs (Ibuprofen) .[name].. 1 by mouth three times a day 8)  Valium 10 Mg Tabs (Diazepam) .[name].. 1 by mouth two times a day 9)  Ambien 10 Mg Tabs (Zolpidem tartrate) .[name].. 1 by mouth at bedtime 10)  Hydrocodone-acetaminophen 10-325 Mg Tabs (Hydrocodone-acetaminophen) .[name].. 1 by mouth every 4 hours as needed for pain  Patient Instructions: 1)  Sign release of records for colonosocpy from Dr. Collene Mares. 2)  Blood work today. 3)  Hydradenitis handout provided today.  Ask Dr. Erling Cruz about this. 4)  Taste - blood work checked today for vitamin levels.  check with Dr. Joya Salm about this as well. 5)  Good to see you today.  Call clinic with questions.   Orders Added: 1)  Venipuncture [46047] 2)  TLB-B12 + Folate  Pnl [[id number]/FOL] 3)  T-Vitamin D (25-Hydroxy) [[id number]] 4)  T- * Misc. Laboratory test [phone] 5)  Est. Patient Level III [85927]    Current Allergies (reviewed today): No known allergies    Prevention & Chronic Care Immunizations   Influenza vaccine: Fluvax 3+  (05/03/2010)    Tetanus booster: 06/05/2005: Td   Tetanus booster due: 06/06/2015    Pneumococcal vaccine: given  (06/05/2005)  Colorectal Screening   Hemoccult: Not documented    Colonoscopy: Not documented  Other Screening   PSA: 0.28  (10/26/2009)   Smoking status: current  (08/02/2010)  Diabetes Mellitus   HgbA1C: 6.1  (06/27/2010)    Eye exam: Not documented    Foot exam: Not documented   High risk foot: Not documented   Foot care education: Not documented    Urine microalbumin/creatinine ratio: Not documented  Lipids   Total Cholesterol: 184  (06/27/2010)   LDL: 68  (02/02/2010)   LDL Direct: 127.4  (06/27/2010)   HDL: 25.30  (06/27/2010)   Triglycerides: 237.0  (06/27/2010)    SGOT (AST): 26  (06/27/2010)   SGPT (ALT): 18  (06/27/2010)   Alkaline phosphatase: 104  (06/27/2010)   Total bilirubin: 0.7  (06/27/2010)  Hypertension   Last Blood Pressure: 138 / 90  (08/02/2010)   Serum creatinine: 1.0  (06/27/2010)   Serum potassium 5.0  (06/27/2010)  Self-Management Support :    Diabetes self-management support: Not documented    Hypertension self-management support: Not documented    Lipid self-management support: Not documented

## 2010-08-15 LAB — SURGICAL PCR SCREEN: Staphylococcus aureus: NEGATIVE

## 2010-08-15 LAB — BASIC METABOLIC PANEL
BUN: 10 mg/dL (ref 6–23)
CO2: 29 mEq/L (ref 19–32)
GFR calc non Af Amer: >60 mL/min (ref >60)
Glucose, Bld: 113 mg/dL — ABNORMAL HIGH (ref 70–99)
Potassium: 4.7 mEq/L (ref 3.5–5.1)
Sodium: 137 mEq/L (ref 135–145)

## 2010-08-15 LAB — GLUCOSE, CAPILLARY

## 2010-08-15 LAB — CBC
HCT: 40.1 % (ref 39.0–52.0)
Hemoglobin: 13.2 g/dL (ref 13.0–17.0)
MCH: 32.5 pg (ref 26.0–34.0)
MCHC: 32.9 g/dL (ref 30.0–36.0)
MCV: 98.8 fL (ref 78.0–100.0)
RDW: 14.6 % (ref 11.5–15.5)

## 2010-08-16 NOTE — Assessment & Plan Note (Signed)
Summary: B-12//kad   Nurse Visit   Allergies: No Known Drug Allergies  Medication Administration  Injection # 1:    Medication: Vit B12 1000 mcg    Diagnosis: VITAMIN B12 DEFICIENCY (ICD-266.2)    Route: IM    Site: L deltoid    Exp Date: 05/04/2012    Lot #: [id number]    Mfr: American Regent    Patient tolerated injection without complications    Given by: Christena Deem CMA Deborra Medina) (August 10, 2010 2:56 PM)  Orders Added: 1)  Admin of Therapeutic Inj  intramuscular or subcutaneous [96372] 2)  Vit B12 1000 mcg [J3420]   Medication Administration  Injection # 1:    Medication: Vit B12 1000 mcg    Diagnosis: VITAMIN B12 DEFICIENCY (ICD-266.2)    Route: IM    Site: L deltoid    Exp Date: 05/04/2012    Lot #: [id number]    Mfr: American Regent    Patient tolerated injection without complications    Given by: Christena Deem CMA ([place]) (August 10, 2010 2:56 PM)  Orders Added: 1)  Admin of Therapeutic Inj  intramuscular or subcutaneous [96372] 2)  Vit B12 1000 mcg [[id number]]

## 2010-08-19 LAB — CBC
HCT: 37.5 % — ABNORMAL LOW (ref 39.0–52.0)
HCT: 37.8 % — ABNORMAL LOW (ref 39.0–52.0)
Hemoglobin: 12.1 g/dL — ABNORMAL LOW (ref 13.0–17.0)
Hemoglobin: 12.5 g/dL — ABNORMAL LOW (ref 13.0–17.0)
MCH: 31.9 pg (ref 26.0–34.0)
MCH: 32.9 pg (ref 26.0–34.0)
MCHC: 32.3 g/dL (ref 30.0–36.0)
MCHC: 33.1 g/dL (ref 30.0–36.0)
MCV: 98.9 fL (ref 78.0–100.0)
MCV: 99.5 fL (ref 78.0–100.0)
Platelets: 192 10*3/uL (ref 150–400)
Platelets: 194 10*3/uL (ref 150–400)
RBC: 3.79 MIL/uL — ABNORMAL LOW (ref 4.22–5.81)
RBC: 3.8 MIL/uL — ABNORMAL LOW (ref 4.22–5.81)
RDW: 15.3 % (ref 11.5–15.5)
RDW: 15.4 % (ref 11.5–15.5)
WBC: 6.4 10*3/uL (ref 4.0–10.5)
WBC: 9.6 10*3/uL (ref 4.0–10.5)

## 2010-08-19 LAB — GLUCOSE, CAPILLARY
Glucose-Capillary: 130 mg/dL — ABNORMAL HIGH (ref 70–99)
Glucose-Capillary: 138 mg/dL — ABNORMAL HIGH (ref 70–99)
Glucose-Capillary: 146 mg/dL — ABNORMAL HIGH (ref 70–99)
Glucose-Capillary: 147 mg/dL — ABNORMAL HIGH (ref 70–99)
Glucose-Capillary: 149 mg/dL — ABNORMAL HIGH (ref 70–99)
Glucose-Capillary: 167 mg/dL — ABNORMAL HIGH (ref 70–99)
Glucose-Capillary: 169 mg/dL — ABNORMAL HIGH (ref 70–99)
Glucose-Capillary: 181 mg/dL — ABNORMAL HIGH (ref 70–99)
Glucose-Capillary: 191 mg/dL — ABNORMAL HIGH (ref 70–99)

## 2010-08-19 LAB — BASIC METABOLIC PANEL
BUN: 13 mg/dL (ref 6–23)
BUN: 9 mg/dL (ref 6–23)
CO2: 29 mEq/L (ref 19–32)
CO2: 30 mEq/L (ref 19–32)
Calcium: 8.4 mg/dL (ref 8.4–10.5)
Calcium: 8.7 mg/dL (ref 8.4–10.5)
Chloride: 105 mEq/L (ref 96–112)
Chloride: 96 mEq/L (ref 96–112)
Creatinine, Ser: 0.76 mg/dL (ref 0.4–1.5)
Creatinine, Ser: 0.79 mg/dL (ref 0.4–1.5)
GFR calc Af Amer: >60 mL/min (ref >60)
GFR calc Af Amer: >60 mL/min (ref >60)
GFR calc non Af Amer: >60 mL/min (ref >60)
GFR calc non Af Amer: >60 mL/min (ref >60)
Glucose, Bld: 145 mg/dL — ABNORMAL HIGH (ref 70–99)
Glucose, Bld: 189 mg/dL — ABNORMAL HIGH (ref 70–99)
Potassium: 3.8 mEq/L (ref 3.5–5.1)
Potassium: 4.3 mEq/L (ref 3.5–5.1)
Sodium: 138 mEq/L (ref 135–145)
Sodium: 138 mEq/L (ref 135–145)

## 2010-08-19 LAB — MAGNESIUM: Magnesium: 1.8 mg/dL (ref 1.5–2.5)

## 2010-08-19 LAB — SURGICAL PCR SCREEN
MRSA, PCR: NEGATIVE
Staphylococcus aureus: NEGATIVE

## 2010-08-19 LAB — PHOSPHORUS: Phosphorus: 3.6 mg/dL (ref 2.3–4.6)

## 2010-08-28 ENCOUNTER — Telehealth: Payer: Self-pay | Admitting: Family Medicine

## 2010-08-29 ENCOUNTER — Encounter: Payer: Self-pay | Admitting: Family Medicine

## 2010-08-29 ENCOUNTER — Other Ambulatory Visit: Payer: Self-pay | Admitting: *Deleted

## 2010-08-29 MED ORDER — ZOLPIDEM TARTRATE 10 MG PO TABS: 10.0000 mg | ORAL_TABLET | Freq: Every evening | ORAL | Status: DC | PRN

## 2010-08-29 MED ORDER — VITAMIN D 50 MCG (2000 UT) PO CAPS: 1.0000 | ORAL_CAPSULE | Freq: Every day | ORAL | Status: DC

## 2010-08-29 MED ORDER — ALPRAZOLAM 0.5 MG PO TABS: 0.5000 mg | ORAL_TABLET | Freq: Two times a day (BID) | ORAL | Status: DC

## 2010-08-29 NOTE — Telephone Encounter (Signed)
Patient notified and will begin Vitamin D 2000 U daily.

## 2010-08-29 NOTE — Telephone Encounter (Signed)
Both Rx's called in as directed.

## 2010-08-29 NOTE — Telephone Encounter (Signed)
Ok to refill.  plz call in.

## 2010-08-29 NOTE — Telephone Encounter (Signed)
If completed weekly course, want him to start taking vit D 2000 U daily, plz call and notify. i already called pharmacy and cancelled weekly dose.

## 2010-08-30 ENCOUNTER — Other Ambulatory Visit: Payer: Self-pay | Admitting: *Deleted

## 2010-08-30 MED ORDER — ALPRAZOLAM 0.5 MG PO TABS: 0.5000 mg | ORAL_TABLET | Freq: Two times a day (BID) | ORAL | Status: DC

## 2010-08-30 MED ORDER — ZOLPIDEM TARTRATE 10 MG PO TABS: 10.0000 mg | ORAL_TABLET | Freq: Every evening | ORAL | Status: DC | PRN

## 2010-08-30 NOTE — Telephone Encounter (Signed)
Called in yesterday.

## 2010-08-30 NOTE — Telephone Encounter (Signed)
Please call in xanax and ambien if not already done.

## 2010-09-07 ENCOUNTER — Ambulatory Visit: Payer: Medicare PPO

## 2010-09-08 ENCOUNTER — Ambulatory Visit (INDEPENDENT_AMBULATORY_CARE_PROVIDER_SITE_OTHER): Payer: Medicare PPO | Admitting: Family Medicine

## 2010-09-08 DIAGNOSIS — E538 Deficiency of other specified B group vitamins: Secondary | ICD-10-CM

## 2010-09-08 MED ORDER — CYANOCOBALAMIN 1000 MCG/ML IJ SOLN
1000.0000 ug | INTRAMUSCULAR | Status: DC
  Administered 2010-09-08: 1000 ug via INTRAMUSCULAR

## 2010-09-09 NOTE — Progress Notes (Signed)
Came in for injection

## 2010-09-21 ENCOUNTER — Ambulatory Visit (INDEPENDENT_AMBULATORY_CARE_PROVIDER_SITE_OTHER): Payer: Medicare PPO | Admitting: Family Medicine

## 2010-09-21 ENCOUNTER — Encounter: Payer: Self-pay | Admitting: Family Medicine

## 2010-09-21 VITALS — BP 118/78 | HR 84 | Temp 98.1°F | Ht 67.0 in | Wt 297.0 lb

## 2010-09-21 DIAGNOSIS — R439 Unspecified disturbances of smell and taste: Secondary | ICD-10-CM

## 2010-09-21 DIAGNOSIS — L732 Hidradenitis suppurativa: Secondary | ICD-10-CM

## 2010-09-21 MED ORDER — METFORMIN HCL 500 MG PO TABS: 500.0000 mg | ORAL_TABLET | Freq: Two times a day (BID) | ORAL | Status: DC

## 2010-09-21 MED ORDER — DOXYCYCLINE HYCLATE 100 MG PO CAPS: 100.0000 mg | ORAL_CAPSULE | Freq: Two times a day (BID) | ORAL | Status: AC

## 2010-09-21 NOTE — Assessment & Plan Note (Addendum)
No obvious infection but worsening per patient.  Treat with another course of doxy hopeful for anti inflammatory effect as well as to cover any simmering infection.  Has had 1 course doxy and 1 course bactrim in last 4 mo.   Pt already on ibuprofen 853m tid as well as other pain meds (norco 10/325 tid).  rec keep scrotum elevated, avoid smoking. Again discussed chronic nature of disease as well as medical control of flares but not cure.  Continue weight loss, sugar control.   More permanent solution would be surgery but pt does not want this which given comorbidities is understandable and likely safest thing. Discussed restart metformin for better glucose control and better skin healing. I do want patient to see Dr. LErling Cruzfor his opinion on surgical treatment of hydradenitis in setting of Charcot-Marie-Tooth disease (surgery thought would need plastic surgery and be bed-bound for 3 mo during recovery). RTC 1 mo.

## 2010-09-21 NOTE — Progress Notes (Signed)
Subjective:    Patient ID: [name], male    DOB: [birth year], 55 y.o.   MRN: [id number]  HPI CC: hydradenitis flare  L inner thigh with 2 boils.  Using warm compresses.  In significant pain.  Has been on narcotics for 15 years.  Recently started oxycodone 92m, too strong.  Has been taking 10/3267mhydrocodones.  To see Dr. BoJoya Salmomorrow for recheck.  Worried may have ruptured another disk.  Pain coming from back.  Still hasn't seen Dr. LoErling Cruz will try to see next month.  Wife to start going to school for C[facility] at [employer] Found out brother had cancer, small cell.  Medications and allergies reviewed and updated as above. Past Medical History  Diagnosis Date  . Anxiety state, unspecified   . Backache, unspecified   . CAD (coronary artery disease)     x3 with stents last 2005, EF 40%  . Cardiomyopathy   . 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 LoErling Cruztype 2 per pt  . COPD (chronic obstructive pulmonary disease)   . T2DM (type 2 diabetes mellitus)   . Disturbances of sensation of smell and taste   . Hepatic steatosis   . Gout   . Hidradenitis suppurativa   . HLD (hyperlipidemia)   . HTN (hypertension)   . History of MI (myocardial infarction)   . Obesity   . OSA (obstructive sleep apnea)   . Tobacco use disorder     cigar  . B12 deficiency   . Vitamin D deficiency   . Weight loss   . Allergic rhinitis   . Cervical spondylosis 05/2010    s/p surgery  . DDD (degenerative disc disease)   . Lumbar herniated disc   . Spinal stenosis     Pain meds prescribed by neurosurgery   Past Surgical History  Procedure Date  . Tonsillectomy and adenoidectomy 1972  . Lumbar disc surgery     L5-S1  . Cervical discectomy 01/2010    Anterior C4-7, cervical cord decompression and B foraminotomy (Botero)(metal plate)  . Lumbar laminectomy 12/11    L2-5laminectomy/foraminotomy for stenosis (Botero)  . Horizon study stenting x4 2000, 2005    in the RCA with kissing balloon  angioplasty, of the PDA ostium-5 stents total per patient  . Cardiac catheterization 03/30/05    Left, Left ventriculography, coronary angiography, intravascular [place] RCA  . Myelogram     L5-S1 and L1-2 spondylosis  . Scrotal us[place]/2012    hydradenitis - testes WNL, small R epididymal cyst, small L varicocele, no sinus tract mention    reports that he has been smoking Cigarettes and Pipe.  He has been smoking about .5 packs per day. He does not have any smokeless tobacco history on file. He reports that he does not drink alcohol or use illicit drugs. family history includes Aneurysm in his brother and mother; Cancer in his brother, father, and mother; Coronary artery disease in his brother; Diabetes in his mother; and Kidney disease in his mother. No Known Allergies  Review of Systems Per HPI    Objective:   Physical Exam  Constitutional: He appears well-developed and well-nourished. No distress.  Skin:          Thickened skin and hyperpigmentation in groin region consistent with hydradenitis. No obvious fluctuance/boil, no obvious infected abscess.  Several inflammed nodules throughout. Scrotum with fistula draining serosanguinous fluid, no pus. Thickened nodules perianal region.          Assessment &  Plan:

## 2010-09-21 NOTE — Patient Instructions (Signed)
Restart doxycycline twice daily for 10 days. Consider medicated powder. Good to see you today, call us with questions. Return in 1 month.

## 2010-09-21 NOTE — Assessment & Plan Note (Signed)
H/o taste abnormality since 01/2010 (after neck surgery), more with salty foods. Found to have low vit B12 at 275, folate at 5, and vit D at 21.  Currently supplementing all.  No improvement in taste. Lead level was 0.

## 2010-09-26 ENCOUNTER — Other Ambulatory Visit: Payer: Self-pay | Admitting: Family Medicine

## 2010-09-26 ENCOUNTER — Other Ambulatory Visit: Payer: Self-pay | Admitting: *Deleted

## 2010-09-26 DIAGNOSIS — L732 Hidradenitis suppurativa: Secondary | ICD-10-CM

## 2010-09-27 ENCOUNTER — Other Ambulatory Visit: Payer: Self-pay | Admitting: *Deleted

## 2010-09-27 MED ORDER — ZOLPIDEM TARTRATE 10 MG PO TABS: 10.0000 mg | ORAL_TABLET | Freq: Every evening | ORAL | Status: DC | PRN

## 2010-09-27 MED ORDER — ALPRAZOLAM 0.5 MG PO TABS: 0.5000 mg | ORAL_TABLET | Freq: Two times a day (BID) | ORAL | Status: DC

## 2010-09-27 NOTE — Telephone Encounter (Signed)
i have not prescribed valium in past - is that a neurosurgery med?  i'd rather he not be on both valium and xanax (2 different benzos). Have refilled other two.  Please phone in.

## 2010-09-27 NOTE — Telephone Encounter (Signed)
Patient notified. He said Dr. Joya Salm prescribed the valium and it was an error that it was sent here. I told him about the possibility of decreased respiratory function and/or OD taking the valium and xanax. He said he understood and never takes them together. The xanax was for anxiety and the valium was for back spasms. He also said he has had 3 more lesions come up on his legs. Should he see derm?

## 2010-09-29 ENCOUNTER — Telehealth: Payer: Self-pay | Admitting: *Deleted

## 2010-09-29 ENCOUNTER — Other Ambulatory Visit: Payer: Self-pay | Admitting: *Deleted

## 2010-09-29 DIAGNOSIS — L732 Hidradenitis suppurativa: Secondary | ICD-10-CM

## 2010-09-29 NOTE — Telephone Encounter (Signed)
Patient called and requests a derm referral. He said Dr. Joya Salm saw the places on his legs and said "that will kill you if it gets on your blood". So now the patient is very concerned. He said he will still go to uro but also wants a derm referral to someone in [place]. He also said he has 3 "circles" on his chest that have come up in the last couple of days. He says they are reddish in color, flat, slightly itchy and maybe a little scaly. He wondered if it was ringworm. I told him I couldn't tell him that over the phone without him being seen. He said he would come in for eval if they become bothersome, but for now he will just watch them. He is more focused on the boils right now and wants referral.

## 2010-09-29 NOTE — Telephone Encounter (Signed)
Patient called back and said that Dr. Joya Salm told him "that will kill you if it gets into your blood", so he is very concerned now. I told him I would let him know what your recommendations are. He said he also has 3 "circles" come up on his chest in the last couple of days. He says they are flat, a little itchy,reddish in color and possibly scaly. He wondered if it was ringworm. I told him I couldn't tell him that over the phone and he needed an eval to determine what they were. He said they weren't bothersome and if they became that way, he would come in for a visit. He is still wanting a derm referral. I told we would call him with that info.

## 2010-09-29 NOTE — Telephone Encounter (Signed)
Patient's wife called and they have requested a dermatology referral for the boils on his legs. They will still go to the urologist if necessary, but he wants a derm referral as well for another opinion. He prefers to go to [place].

## 2010-09-29 NOTE — Telephone Encounter (Signed)
Ok to make referral for second opinion on hydradenitis.  Please let me know who pt will see.

## 2010-09-29 NOTE — Telephone Encounter (Signed)
Message left notifying patient to expect call from [facility] in reference to derm referral.

## 2010-09-30 ENCOUNTER — Other Ambulatory Visit: Payer: Self-pay | Admitting: *Deleted

## 2010-10-03 MED ORDER — METFORMIN HCL 500 MG PO TABS: ORAL_TABLET | ORAL | Status: DC

## 2010-10-03 MED ORDER — ALPRAZOLAM 0.5 MG PO TABS: 0.5000 mg | ORAL_TABLET | Freq: Two times a day (BID) | ORAL | Status: DC

## 2010-10-03 MED ORDER — ZOLPIDEM TARTRATE 10 MG PO TABS: ORAL_TABLET | ORAL | Status: DC

## 2010-10-03 MED ORDER — FOLIC ACID 1 MG PO TABS: 1.0000 mg | ORAL_TABLET | Freq: Every day | ORAL | Status: DC

## 2010-10-03 NOTE — Telephone Encounter (Signed)
filled out form and placed in my "out" box

## 2010-10-03 NOTE — Telephone Encounter (Signed)
Forms faxed by Maudie Mercury.

## 2010-10-05 ENCOUNTER — Ambulatory Visit: Payer: Medicare PPO

## 2010-10-06 ENCOUNTER — Ambulatory Visit: Payer: Medicare PPO

## 2010-10-06 ENCOUNTER — Telehealth: Payer: Self-pay | Admitting: *Deleted

## 2010-10-06 DIAGNOSIS — G600 Hereditary motor and sensory neuropathy: Secondary | ICD-10-CM

## 2010-10-06 NOTE — Telephone Encounter (Signed)
Put order in chart.   Who has he been set up with Derm?  Thanks.

## 2010-10-06 NOTE — Telephone Encounter (Signed)
Noted.  Seen 5/1.

## 2010-10-06 NOTE — Telephone Encounter (Signed)
Patient called and said since it's been 3 years since he saw Dr. Erling Cruz at [facility] they are requiring a referral. Can you please send a referral to Dr. Erling Cruz for him? Thanks.

## 2010-10-06 NOTE — Telephone Encounter (Signed)
Dr. Allyson Sabal is who he is seeing for derm

## 2010-10-07 ENCOUNTER — Ambulatory Visit (INDEPENDENT_AMBULATORY_CARE_PROVIDER_SITE_OTHER): Payer: Medicare PPO | Admitting: Family Medicine

## 2010-10-07 DIAGNOSIS — E538 Deficiency of other specified B group vitamins: Secondary | ICD-10-CM

## 2010-10-07 MED ORDER — CYANOCOBALAMIN 1000 MCG/ML IJ SOLN
1000.0000 ug | Freq: Once | INTRAMUSCULAR | Status: AC
  Administered 2010-10-07: 1000 ug via INTRAMUSCULAR

## 2010-10-07 NOTE — Progress Notes (Signed)
B-12 injection given as directed.

## 2010-10-13 ENCOUNTER — Other Ambulatory Visit: Payer: Self-pay | Admitting: Family Medicine

## 2010-10-13 ENCOUNTER — Encounter: Payer: Self-pay | Admitting: Family Medicine

## 2010-10-13 DIAGNOSIS — L732 Hidradenitis suppurativa: Secondary | ICD-10-CM

## 2010-10-17 ENCOUNTER — Ambulatory Visit: Payer: Medicare PPO | Admitting: Family Medicine

## 2010-10-18 NOTE — Assessment & Plan Note (Signed)
[place] OFFICE NOTE   NAME:[name], [name]                   MRN:          [id number]  DATE:03/04/2008                            DOB:          12/11/1955    HISTORY OF PRESENT ILLNESS:  [name] is a pleasant gentleman who  is 55 years old male who I have seen in the past for coronary artery  disease.  He has had prior inferior infarct with PCI of his right  coronary artery and PDA.  His most recent cardiac catheterization was  performed on March 30, 2005.  The patient had an ejection fraction of  40% with inferior akinesis at that time.  However, his stents in the  right coronary artery were widely patent.  Note, his most recent  echocardiogram on March 30, 2005 showed normal to mildly reduced LV  function.  I have not seen him since January 2007.  Since that time, the  patient is doing reasonably well.  He does have dyspnea with more  extreme activities but not with routine activities.  There is no  orthopnea, PND, pedal edema, palpitations, presyncope, syncope, or chest  pain.  He is trying to discontinue his tobacco use.  He is not able to  exercise due to Charcot-Marie-Tooth disease.   CURRENT MEDICATIONS:  1. Aspirin 325 mg p.o. daily.  2. Plavix 75 mg p.o. daily.  3. Toprol 50 mg p.o. daily.  4. Urolic.  5. Vicodin as needed.  6. Xanax muscle relaxer.  7. Gabapentin.  8. Metformin 500 mg p.o. daily.   PHYSICAL EXAMINATION:  VITAL SIGNS:  Today shows a blood pressure of  106/75 and his pulse is 73.  He weighs 282 pounds.  HEENT:  Normal.  NECK:  Supple.  No bruits.  CHEST:  Clear.  CARDIOVASCULAR:  Regular rate and rhythm.  ABDOMEN:  No tenderness.  EXTREMITIES:  No edema.   Electrocardiogram shows a sinus rhythm at a rate of 71.  The axis is  normal.  There is a prior inferior infarct.   DIAGNOSES:  1. Coronary artery disease - [name] appears to be doing  reasonable well from symptomatic standpoint.  He does have some      dyspnea on exertion and he also had his last functional study      approximately 3 years ago.  We will plan to proceed with a Myoview      for risk stratification.  If it shows no ischemia, then we will      continue with medical therapy.  He will continue on his aspirin and      Plavix as well as beta-blocker.  I am adding a statin as well.  2. Ischemic cardiomyopathy - he will continue on his beta-blocker.  I      am also adding lisinopril 2.5 mg p.o. daily.  We will check BMET in      one week to follow potassium and renal function.  3. Tobacco abuse - we discussed the importance of discontinuing this      for between 3-10 minutes.  4.  History of Charcot-Marie-Tooth disease.  5. Hyperlipidemia - we are adding Pravachol and check lipids and liver      in 6 weeks.  6. Chronic obstructive pulmonary disease.  7. History of gout.   We discussed the importance of diet and exercise.  We will see him back  in 12 months.     Denice Bors Stanford Breed, MD, [facility]  Electronically Signed    BSC/MedQ  DD: 03/04/2008  DT: 03/05/2008  Job #: [id number]

## 2010-10-21 ENCOUNTER — Ambulatory Visit (INDEPENDENT_AMBULATORY_CARE_PROVIDER_SITE_OTHER)
Admission: RE | Admit: 2010-10-21 | Discharge: 2010-10-21 | Disposition: A | Discharge status: Home / Self Care | Payer: Medicare PPO | Source: Ambulatory Visit | Attending: Family Medicine | Admitting: Family Medicine

## 2010-10-21 ENCOUNTER — Encounter: Payer: Self-pay | Admitting: Family Medicine

## 2010-10-21 ENCOUNTER — Other Ambulatory Visit: Payer: Self-pay | Admitting: Family Medicine

## 2010-10-21 ENCOUNTER — Ambulatory Visit (INDEPENDENT_AMBULATORY_CARE_PROVIDER_SITE_OTHER): Payer: Medicare PPO | Admitting: Family Medicine

## 2010-10-21 DIAGNOSIS — G600 Hereditary motor and sensory neuropathy: Secondary | ICD-10-CM

## 2010-10-21 DIAGNOSIS — I10 Essential (primary) hypertension: Secondary | ICD-10-CM

## 2010-10-21 DIAGNOSIS — R634 Abnormal weight loss: Secondary | ICD-10-CM

## 2010-10-21 DIAGNOSIS — E119 Type 2 diabetes mellitus without complications: Secondary | ICD-10-CM

## 2010-10-21 DIAGNOSIS — F17200 Nicotine dependence, unspecified, uncomplicated: Secondary | ICD-10-CM

## 2010-10-21 DIAGNOSIS — L732 Hidradenitis suppurativa: Secondary | ICD-10-CM

## 2010-10-21 LAB — CBC WITH DIFFERENTIAL/PLATELET
Basophils Relative: 0.5 % (ref 0.0–3.0)
Hemoglobin: 13.8 g/dL (ref 13.0–17.0)
Lymphocytes Relative: 21.7 % (ref 12.0–46.0)
MCHC: 33.6 g/dL (ref 30.0–36.0)
Monocytes Relative: 4.5 % (ref 3.0–12.0)
Neutro Abs: 6.8 10*3/uL (ref 1.4–7.7)
RBC: 4.18 Mil/uL — ABNORMAL LOW (ref 4.22–5.81)

## 2010-10-21 LAB — BASIC METABOLIC PANEL
BUN: 15 mg/dL (ref 6–23)
CO2: 26 mEq/L (ref 19–32)
Chloride: 107 mEq/L (ref 96–112)
Glucose, Bld: 84 mg/dL (ref 70–99)
Potassium: 4.5 mEq/L (ref 3.5–5.1)

## 2010-10-21 NOTE — Cardiovascular Report (Signed)
[name], [name]            ACCOUNT NO.:  [id number]   MEDICAL RECORD NO.:  [id number]          PATIENT TYPE:  INP   LOCATION:  6533                         FACILITY:  [facility]   PHYSICIAN:  Ethelle Lyon, M.D. LHCDATE OF BIRTH:  1956/05/12   DATE OF PROCEDURE:  03/30/2005  DATE OF DISCHARGE:                              CARDIAC CATHETERIZATION   PROCEDURE:  Left heart catheterization, left ventriculography, coronary  angiography, intravascular ultrasound of the right coronary artery.   INDICATIONS:  Mr. Servantes is a 55 year old gentleman who suffered inferior  myocardial infarction on March 19, 2004.  He returns today for follow-up  angiography and intravascular ultrasound as part of the HORIZON trial.   PROCEDURAL TECHNIQUE:  Informed consent was obtained.  Under 1% lidocaine  local anesthesia and using a Smart needle a 6-French sheath was placed in  the right common femoral artery using the modified Seldinger technique.  Anticoagulation was initiated with heparin.  ACT was confirmed to be greater  than 250 seconds.  Diagnostic angiography of the left system was performed  using a JL4 catheter.  Left heart catheterization and ventriculography was  performed using a pigtail catheter.  During this portion of the procedure  the patient began to complain of some left shoulder discomfort.  It was  initially fairly mild.   A JR4 guiding catheter was advanced over a wire and engaged in the ostium of  the RCA.  A Luge wire was advanced to the distal portion of the posterior  left ventricular branch of the RCA without difficulty.  I then performed  intravascular ultrasound of the RCA by automated pullback.  200 mcg of  intracoronary nitroglycerin was administered during this.  During the  pullback the pain in his left shoulder escalated to a 10/10.  Blood pressure  was mildly low at approximately 85 mmHg systolic down from 585-277 prior to  initiation of the procedure.  He  became diaphoretic.  He was treated with  atropine and fentanyl without significant change in his hemodynamics or  pain.   Out of concern that we had dissection in the left system I removed the  guiding catheter and wire and reintroduced the left diagnostic catheter.  Angiography demonstrated somewhat sluggish flow in the LAD, but normal flow  in the circumflex.  Monitor showed no ST abnormalities.  Though there was no  air embolism identified on any of the films, I went ahead and placed him on  100% nonrebreather prophylactically.  I administered intracoronary  nitroglycerin and then intracoronary adenosine with restoration of TIMI 3  flow to the LAD.  Despite these maneuvers and restoration of flow, his pain  remained 10/10.   We then returned attention to the RCA.  The guide was reintroduced as was  the wire.  I completed a second IVUS run to complete interrogation of the  entire stented portion of the vessel.  This was uncomplicated.  Catheter was  then removed.   I then obtained an electrocardiogram which was unchanged from one obtained  on March 23, 2005.  Specifically, there were no changes in the ST segments  from  previous.  I also obtained an emergent echocardiogram.  This  demonstrated normal motion of the anterior wall, the preexistent inferior  akinesis, no significant valvular abnormality, and no pericardial effusion.   We then removed the drapes and allowed the patient to move a bit.  With  motion of his left shoulder his pain improved substantially, but did not  fully resolve.   COMPLICATIONS:  Severe left shoulder pain without clear etiology.   FINDINGS:  1.  LV 111/16/20.  EF 40% with inferior akinesis.  2.  No aortic stenosis or mitral regurgitation.  3.  Left main:  Angiographically normal.  4.  LAD:  Moderate sized vessel giving rise to one large diagonal.  There is      a 30% stenosis of the proximal vessel.  5.  Circumflex:  Moderate sized vessel giving  rise to two obtuse marginals.      It is angiographically normal.  6.  RCA:  Moderate sized dominant vessel.  The stents in the proximal and      mid vessel as well as distal vessel and PLV are widely patent.  The      jailed PDA that had been treated with balloon angioplasty remains widely      patent as well.   IMPRESSION/RECOMMENDATION:  1.  Widely patent stents in the right coronary artery.  2.  Minimal disease in the left coronary system.  3.  Elevated left ventricular end-diastolic pressure which may explain his      dyspnea.  4.  Left shoulder pain and chest pain is not well explained based on      findings.  Will therefore observe him in the hospital.      Ethelle Lyon, M.D. [facility]  Electronically Signed     WED/MEDQ  D:  03/30/2005  T:  03/30/2005  Job:  [id number]

## 2010-10-21 NOTE — Assessment & Plan Note (Signed)
Much improved on treatment per dermatology.

## 2010-10-21 NOTE — Assessment & Plan Note (Signed)
Stable on current med.  Never been too much of an issue.  Continue toprol xl.

## 2010-10-21 NOTE — H&P (Signed)
NAMECALVYN, Ware NO.:  [id number]   MEDICAL RECORD NO.:  [id number]          PATIENT TYPE:  EMS   LOCATION:  MAJO                         FACILITY:  [facility]   PHYSICIAN:  Kirk Ruths, M.D. LHCDATE OF BIRTH:  04-Jun-1956   DATE OF ADMISSION:  03/19/2004  DATE OF DISCHARGE:                                HISTORY & PHYSICAL   HISTORY OF PRESENT ILLNESS:  [name] is a 55 year old male with past  medical history of coronary artery disease, hyperlipidemia, and Charcot-  Marie-Tooth disease, who presents with chest pain.  The patient has had a  prior PCI in 2000, but I do not have his records available.  This apparently  occurred in the setting of a myocardial infarction.  He has not had chest  pain recently.  He did state that he developed substernal chest pain  beginning at 8:30 a.m.  It was described as a heaviness.  The pain did not  radiate.  There was associated nausea, vomiting, shortness of breath, and  diaphoresis.  The pain was nonpleuritic or positional nor was it related to  food.  He presented to the emergency room and his pain persists.  Electrocardiogram shows 1 mm of ST elevation inferiorly with reciprocal ST  depression anteriorly consistent with probable acute inferior myocardial  infarction.   ALLERGIES:  No known drug allergies.   MEDICATIONS:  1.  Aspirin.  2.  Toprol.  3.  Zocor.  4.  Vicodin and Xanax as needed.   SOCIAL HISTORY:  He does smoke, but he only rarely consumes alcohol.   FAMILY HISTORY:  Negative for coronary artery disease.   PAST MEDICAL HISTORY:  Significant for hyperlipidemia, but he denies any  hypertension or diabetes mellitus.  He does have a history of Charcot-Marie-  Tooth disease and is on disability for this particular reason.  His past  medical history is otherwise unremarkable.   REVIEW OF SYSTEMS:  He denies any headaches, fevers, or chills.  There is no  productive cough or hemoptysis.  There is no  dysphagia, odynophagia, or  melena.  He does state that he occasionally has hematochezia and his primary  care physician knows about this by his report.  There is no hematuria.  There is no rash or seizure activity.  There is no orthopnea, PND, or pedal  edema.  He does have some pain in his extremities from his Charcot-Marie-  Tooth disease.  The remaining systems are negative.   PHYSICAL EXAMINATION:  VITAL SIGNS:  Pulse is in the 60's.  His blood  pressure is 118/70.  GENERAL:  He is well-developed and well-nourished, mildly diaphoretic at the  time of this evaluation.  SKIN:  Otherwise warm.  NEUROLOGY:  He does not appear to be depressed and there is no peripheral  clubbing.  HEENT:  Unremarkable with normal eyelids.  NECK:  Supple with normal upstroke bilaterally and there are no bruits  noted.  There is no jugular venous distention and I cannot appreciate  thyromegaly.  CHEST:  Clear to auscultation, normal expansion.  HEART:  Regular rate and rhythm with normal S1  and S2.  There are no  murmurs, rubs, or gallops noted.  ABDOMEN:  Nontender and nondistended.  Positive bowel sounds.  No  hepatosplenomegaly.  No masses appreciated. There is no abdominal bruit.  He  has 2+ femoral pulses bilaterally and no bruits.  EXTREMITIES:  No edema and I can palpate no cords.  He has 2+ dorsalis pedis  pulses bilaterally.  NEUROLOGY:  Grossly intact.   His electrocardiogram shows approximately 1 mm of ST elevation inferiorly  with reciprocal ST depression anteriorly.  The remaining laboratories are  pending at the time of this dictation as his chest x-ray.   ADMISSION DIAGNOSES:  1.  Acute inferior myocardial infarction.  2.  History of coronary artery disease.  3.  Hyperlipidemia.  4.  Charcot-Marie-Tooth disease.   PLAN:  The patient presents with an acute inferior myocardial infarction.  We will treat with aspirin, heparin, nitroglycerin, Lopressor, and a statin.  I have discussed  the risks and benefits of emergent cardiac catheterization  with the patient and he agrees to proceed.  I have also discussed the  importance of discontinuing his tobacco use.  He has had a history of mild  hematochezia and this will need to be worked up after he recovers from his  myocardial infarction.       BC/MEDQ  D:  03/19/2004  T:  03/19/2004  Job:  [id number]

## 2010-10-21 NOTE — Assessment & Plan Note (Signed)
Lab Results  Component Value Date   HGBA1C 6.1 06/27/2010   Good control last visit, in our chart had metformin 1062m daily, but pt states he has been taking 10062mtwice daily.  Will check A1c, if 6.4% or lower, will recommend change to only 50036mwice daily.  Diabetic foot exam performed today.

## 2010-10-21 NOTE — Assessment & Plan Note (Signed)
[facility]                                 ON-CALL NOTE   NAME:[name], [name]                     MRN:          [id number]  DATE:05/12/2006                            DOB:          1955/11/14    [phone].   The patient calling because he has pain in his neck.  He saw Dr.  Burnice Logan 2 weeks ago, and Dr. Burnice Logan told him he had ruptured  disks in his neck.  He has been on medication but he is not better.  He  is now worse either.  His pain has been the same.  He has no neurologic  deficit.  He says he takes Vicodin 1 every four hours, it does not help.  When he tries to take 2, it makes him nauseated.  The patient then goes  on to talk about trying to get disability, and all these other issues.  I advised the patient he could do one of two things.  Number 1, see Dr.  Burnice Logan on Monday for followup, or for pain control if he could not  control his pain as an outpatient he would have to be admitted for IV  pain control.  The patient will choose.     Jeffrey A. Sherren Mocha, MD  Electronically Signed    JAT/MedQ  DD: 05/12/2006  DT: 05/13/2006  Job #: [id number]

## 2010-10-21 NOTE — Cardiovascular Report (Signed)
[name], [name]            ACCOUNT NO.:  [id number]   MEDICAL RECORD NO.:  [id number]          PATIENT TYPE:  INP   LOCATION:  1823                         FACILITY:  [facility]   PHYSICIAN:  Ethelle Lyon, M.D. LHCDATE OF BIRTH:  1955/11/17   DATE OF PROCEDURE:  03/19/2004  DATE OF DISCHARGE:                              CARDIAC CATHETERIZATION   PROCEDURE:  1.  Left heart catheterization.  2.  Left ventriculography.  3.  Coronary angiography.  4.  Horizon study stenting x 4 in the RCA with kissing balloon angioplasty      of the PDA ostium.  5.  Intravascular ultrasound of the RCA.   INDICATIONS:  [name] is a 55 year old gentleman with coronary artery  disease, status post prior intervention several years ago (details  unavailable).  At 8:30 this morning, he developed sudden onset of substernal  chest discomfort associated with diaphoresis.  He arrived at the hospital at  9:56 with ongoing chest discomfort.  Electrocardiogram demonstrated inferior  ST elevations of 1 mm with reciprocal changes anteriorly.  He was referred  for emergent cardiac catheterization with a percutaneous revascularization.  He consented to participate in the Horizon study in a 2 by 2 randomized  fashion bivalirudin versus heparin plus eptifibatide and in the second  randomization drug-eluding stent versus bare metal stent for treatment in  acute myocardial infarction.   DESCRIPTION OF PROCEDURE:  Informed consent was obtained.  Under 1%  lidocaine local anesthesia, a 6 French sheath was placed in the right common  femoral artery using the modified Seldinger technique.  Diagnostic  angiography of the left system was performed using a JL4 catheter.  A 6  [place] guide was then advanced over wire and engaged in the ostium of  the RCA.  This demonstrated a 90% stenosis of the proximal RCA with  occlusion of the distal RCA before the takeoff of the PDA.  There was TIMI 0  flow to the distal  vasculature.  Decision was made to proceed with  percutaneous revascularization.   During diagnostic angiography, bivalirudin was initiated per protocol.  ACT  was confirmed to be greater than 225 seconds.  A luge wire was advanced  across the occlusion without difficulty into the PDA.  I began with  predilation of the lesion of the proximal vessel using 2.0 by 15 mm [name]  at 6 atmospheres.  I then used this balloon to dotter the distal vessel.  This did not establish in meaningful distal perfusion.  Therefore, the  balloon was advanced into the distal RCA extending across the PDA ostium.  This was predilated at 6 atmospheres as well.   With this inflation, TIMI III flow to the distal vasculature was  established.  There was, however, severe stenosis of the ostium of the  posterior left ventricular branch which was in fact substantially larger  than the PDA.  Wire was thus redirected into the PLV.  Vertebral balloon  angioplasty was performed using the same 2.0 mm balloon.  There was a  deterioration in the flow to TIMI 0 to TIMI I due to severe recurrent  stenosis in the proximal to the takeoff of the PDA.  I then returned the  attention to the proximal vessel in order to allow optimal visualization of  the distal vessel for stent placement.   The proximal lesion was very long.  I began with a 3.5 by 32 mm Horizon  study stent deployed at 16 atmospheres.  Overlapping this proximally, a 3.5  by 16 mm Horizon study stent was deployed also at 16 atmospheres.  With  this, TIMI III flow was reestablished to the distal vasculature.  Attention  was then turned to the lesion at the distal RCA extending into the proximal  PLV.  A 2.75 by 24 mm Horizon study stent was deployed at 16 atmospheres  jailing the PDA.  There was then a greater than 95% stenosis of the ostium  of the PDA, though TIMI III flow was preserved.  There was also a 70% new  stenosis of the PLV.  These did not respond to  repeated boluses of  intracoronary nitroglycerin.  Therefore, decision was made to proceed with  balloon angioplasty of the PDA ostium using kissing balloon technique.   I advanced a BMW wire through the stent into the PDA.  I then advanced a 2.5  by 12 mm [name] over this and dilated it at 8 atmospheres.  I then  postdilated the stent in the distal vessel using a 2.75 by 20 mm Quantum for  two successive inflations at 16 atmospheres.  This resulted in recurrent  stenosis in the PDA ostium.  This was treated with repeat inflation with a  2.5 mm balloon out the side wall of the stent.  I then finished with kissing  balloon angioplasty of the distal RCA and PDA ostium using simultaneous  inflations of 2.5 by 15 mm Mavericks to 8 atmospheres in each.  This  resulted in no residual stenosis in the ostium of the PDA.  There  unfortunately remained severe stenosis in the PLV distal to the previously  placed stent which failed to respond to boluses of intracoronary  nitroglycerin.   While there was TIMI III flow, I was concerned that this severe residual  stenosis would limit outflow of the long stented regions and thus predispose  to subacute stent thrombosis.  Therefore, a fourth Horizon study stent (2.5  by 12 mm) was deployed overlapping the distal margin of the distal stent at  16 atmospheres.  The stent was then postdilated using a 2.5 by 13 mm power  cell at 16 atmospheres for two successive inflations with careful attention  to the overlap region.  The entirety of the more proximal two stents were  then postdilated using a 3.5 mm power cell at 18 atmospheres for four  successive inflations.   I then further postdilated the region of overlap of the most distal stents  within the PLV using a 2.75 by 8 mm Quantum at 18 atmospheres.   Per Horizon study protocol, I then proceed to intravascular ultrasound of the treated regions of the RCA.  This required two runs as it exceeded the  100  mm length of the pullback mechanism.  This demonstrated these stents to  be fully expanded and well opposed.  There was some nonocclusive  intraluminal thrombus within the stent proximal to the takeoff of the PDA.  This was confirmed on subsequent angiography.  Therefore bail out  eptifibatide double bolus was administered.  A 3.0 by 8 mm [name] was then  used to further dilate  this area at 16 atmospheres.  This resulted in  substantial improvement in the thrombus.  Final angiography demonstrated no  residual stenosis in any of the lesions including the PDA, ostium, and TIMI  III flow to the distal vasculature.   Finally, pigtail catheter was advanced into the left ventricle.  Left heart  pressures were measured.  Ventriculoplasty was performed by power injection.   The patient tolerated the procedure well and was transferred to the CCU in  stable condition having tolerated the procedure well.   COMPLICATIONS:  None.   FINDINGS:  1.  Left ventriculogram:  142/24/28.  EF equals 50% with severe hypokinesis      of the midinferior wall.  2.  No aortic stenosis or mitral regurgitation.  3.  Left main:  There is 20% stenosis of the distal vessel.  4.  LAD:  Moderate size vessel giving rise to a single branching diagonal.      The LAD is without any significant disease.  The diagonal has a 20%      stenosis in its midsection.  5.  Ramus intermedius:  Very small vessel which is angiographically normal.  6.  Circumflex:  Large vessel giving rise to a large first and third obtuse      marginal and a very small second obtuse marginal.  This tiny second      obtuse marginal is severely diseased.  The distal AV groove circumflex      before the takeoff of the third marginal has a smooth 60% stenosis.  7.  RCA:  Large dominant vessel.  It was severely diseased with long      stenosis in the proximal and mid-vessel treated with overlapping stents.      There was occlusion of the distal vessel  proximal to the takeoff of the      PDA.  This was treated with overlapping stents in the distal RCA jailing      the PDA as well as kissing balloon angioplasty of the PDA ostium.  At      completion, there was no residual stenosis and TIMI III flow to the      distal vasculature.   IMPRESSION:  Very difficult case requiring multiple stents and kissing  balloon angioplasty of the PDA ostium.  There was excellent angiographic  results.  Bail out eptifibatide was administered for thrombus accumulating  within the stents at the completion of the procedure.  This had improved by  final angiogram.  Eptifibatide will be continued for 18 hours.  Plavix  should be continued for at least a year.  Aspirin will be continued  indefinitely.  Statin, beta blocker, and ACE inhibitor will all be  initiated.  Smoking cessation will be encouraged.       WED/MEDQ  D:  03/19/2004  T:  03/19/2004  Job:  [id number]  cc:   Kirk Ruths, M.D. [facility]

## 2010-10-21 NOTE — Assessment & Plan Note (Signed)
[facility] OFFICE NOTE   NAME:[name], [name]                     MRN:          [id number]  DATE:07/30/2006                            DOB:          14-Jan-1956    The patient was scheduled for followup at 12:30 on July 30, 2006,  and failed to return for the appointment.  The patient altered a  prescription last week at [facility] and also gave a bogus  address to the pharmacist.  The patient was to return today for followup  and to clarify these events.   DISPOSITION:  A letter to inform the patient of termination will be sent  out today.     Marletta Lor, MD  Electronically Signed    PFK/MedQ  DD: 07/30/2006  DT: 07/30/2006  Job #: [id number]

## 2010-10-21 NOTE — Assessment & Plan Note (Signed)
Over last several months (since Aug 2011) pt has had significant weight loss, states weighed 350lbs prior.  Endorses night sweats, recheck CBC today as well as CXR in this smoker.  Per pt, UTD colon screening, prostate screening. Due for CPE, requested pt set up in 3 mo. Wt Readings from Last 3 Encounters:  10/21/10 290 lb 1.9 oz (131.598 kg)  09/21/10 297 lb (134.718 kg)  08/02/10 299 lb 8 oz (135.852 kg)

## 2010-10-21 NOTE — Assessment & Plan Note (Signed)
Encouraged cessation again.

## 2010-10-21 NOTE — Patient Instructions (Signed)
Return for physical in 3 months. Blood work and Insurance account manager today. Good to see you, call us with questions.

## 2010-10-21 NOTE — Discharge Summary (Signed)
[name], [name]            ACCOUNT NO.:  [id number]   MEDICAL RECORD NO.:  [id number]          PATIENT TYPE:  INP   LOCATION:  2017                         FACILITY:  [facility]   PHYSICIAN:  Kirk Ruths, M.D. LHCDATE OF BIRTH:  [birth year]   DATE OF ADMISSION:  03/19/2004  DATE OF DISCHARGE:  03/21/2004                                 DISCHARGE SUMMARY   DISCHARGE DIAGNOSIS:  1.  Admitted with acute inferior myocardial infarction, troponins cresting      at 30.70.  2.  Chest pain on admission.  3.  Status post emergent left heart catheterization March 19, 2004.  The      study shows 90% proximal right coronary artery occlusion, 100% distal      right coronary artery occlusion at the bifurcation with the posterior      descending artery.  4.  Status post stents x 2 in the proximal right coronary artery, status      post stents x 2 in the distal right coronary artery and in the posterior      descending artery.  5.  Ejection fraction 50% at catheterization March 19, 2004.   SECONDARY DIAGNOSIS:  1.  Known history of coronary artery disease with prior percutaneous      coronary intervention of the right coronary artery in 2000.  2.  Ongoing tobacco abuse.  3.  History of Charcot-Marie-Tooth syndrome.  4.  Elevated liver function studies this admission with SGOT of 149 and SGPT      96.  5.  Abnormal chest x-ray showing borderline and enlarged cardiac silhouette,      diffuse peribronchial thickening, and accentuation of interstitial      markings to be followed up by his primary care physician.   DISCHARGE DISPOSITION:  [name] feels very well on the day of  discharge, October 17, after undergoing stenting x 4 in the right coronary  artery distribution.  He has had no chest pain since his catheterization.  He has no shortness of breath.  His post catheterization creatinine is 1,  post catheterization hemoglobin is 13.1.  He is maintaining a sinus rhythm.  He is able to tolerate his medications except that his systolic blood  pressure is in the low 100s at the time of discharge and planned ACE-I  inhibitor has been on hold, however, this may be able to be started as an  outpatient with this recommendation.   DISCHARGE MEDICATIONS:  He goes home on the following medications:  1.  Enteric coated aspirin 325 mg daily.  2.  Plavix 75 mg daily.  3.  Toprol XL 50 mg daily.  4.  Zocor 40 mg daily at bedtime  5.  Colchicine 0.6 mg daily.  6.  Allopurinol as before hospitalization.  7.  Vicodin 5/500, 1 tablet in the morning, 1 tablet at lunch, 1 tablet at      dinner, 1 tablet at bedtime.  8.  Xanax 0.5 mg twice daily.  9.  Nitroglycerin 0.4 mg 1 tablet under the tongue every 5 minutes x 3 doses      as needed  for chest pain.  10. Nicoderm transdermal nicotine patch 21 mg per 24 hours applied to upper      outer arm in the morning, take off at 10 p.m. in the evening daily for      six weeks, then switch to a 14 mg per 24 hours transdermal patch for two      weeks, then switch to a 7 mg per 24 hours patch for two weeks.  11. As far as pain control, the patient may take his existing Vicodin or      Tylenol 1-2 tablets every 4-6 hours.   ACTIVITY:  He is to avoid heavy lifting or straining for the next two weeks.  He may begin driving Tuesday, October 18.   DISCHARGE DIET:  Low sodium, low cholesterol diet.   FOLLOW UP:  The patient has been enrolled in the Horizon study with follow  up occurring one month, six months, one year, and then yearly for five  years.  Follow up catheterization in 13 months.  He also has a follow up  with Dr. Stanford Breed Wednesday, April 06, 2004, at noon at the [facility] at [address]  He also has blood work for liver function  studies at [facility] Monday, May 02, 2004.   PROCEDURE:  Emergent left heart catheterization March 19, 2004, ejection  fraction 50% with severe  hypokinesis of the mid inferior wall, no aortic  stenosis or mitral regurgitation.  The LAD distribution is relatively free  of disease with a 20% mid point stenosis in a diagonal.  The left circumflex  has a distal 60% stenosis after a ramus intermediate and the second obtuse  marginal is very small with a 90% mid point stenosis.  The right coronary  artery, however, has severe disease, 90% proximal stenosis which has been  reduced to 0% with two stents, these are both Horizon study stents.  In the  distal right coronary artery, a Horizon study stent was placed.  There was  also a kissing balloon angioplasty of the ostial PDA with subsequent  placement of a Horizon study stent.  He will be on aspirin or Plavix for one  year and follow up with Horizon study at [facility] cardiology.   BRIEF HISTORY:  [name] is a 55 year old male with a past  medical history of coronary artery disease.  He also has dyslipidemia and  Charcot-Marie-Tooth syndrome.  He presented with chest pain which began at  8:30 on the morning of October 15.  He describes this as chest pressure, no  radiation, there is nausea and vomiting, shortness of breath, and  diaphoresis.  He presented to the emergency room  with consistent pain  despite having taken some of his brother's nitroglycerin.  Electrocardiogram  showed sinus rhythm with 1 mm elevated ST segments in the inferior leads  with reciprocal ST depressions in the anterior leads.  The patient was  started on aspirin, heparin, nitroglycerin, beta blocker, and statin this  procedure with emergent catheterization.   HOSPITAL COURSE:  After admission on March 19, 2004, with acute inferior  wall myocardial infarction, he was taken directly to the catheterization  laboratory where four stent were placed in the Horizon stent study as  dictated above.  Dr. Albertine Patricia was the interventional cardiologist.  Post stent placement, the patient has done very well.  Chest  x-ray showed peribronchial  thickening and accentuation of interstitial markings for which he will be  primary  care physician.  He also had a smoking cessation consult since the  patient has been an active user of tobacco products and he has been strongly  recommended to discontinue tobacco products and goes home with a  prescription for Nicoderm patch and further counseling.  He has follow up as  dictated above.  It is recommended for him to see his primary care physician  in follow up.   DISCHARGE LABORATORY DATA:  CBC with white blood cell count 9.5, hemoglobin  13.1, hematocrit 36.9, platelets 200.  Serum electrolytes the day of  discharge with sodium 140, potassium 3.7, chloride 107, carbonate 27, BUN 8,  creatinine 1, glucose 102.  Alkaline phos 90, SGOT 149, SGPT 96.  Total  cholesterol 146, triglycerides 145, HDL cholesterol low at 29, LDL  cholesterol 82.  HGB A1C was 6.  CRP 10.  Troponin level studies were in  sequence as follows:  15.06, 30.7, 21.71.     GM/MEDQ  D:  03/21/2004  T:  03/21/2004  Job:  [id number]
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
## 2010-10-21 NOTE — Discharge Summary (Signed)
NAMETRISTAN, PROTO NO.:  [id number]   MEDICAL RECORD NO.:  [id number]          PATIENT TYPE:  INP   LOCATION:  6533                         FACILITY:  [facility]   PHYSICIAN:  Ethelle Lyon, M.D. LHCDATE OF BIRTH:  Aug 02, 1955   DATE OF ADMISSION:  03/30/2005  DATE OF DISCHARGE:  03/31/2005                                 DISCHARGE SUMMARY   PRIMARY CARE PHYSICIAN:  Marletta Lor, M.D. [facility]   PRIMARY CARDIOLOGIST:  Kirk Ruths, M.D. [facility]   PRINCIPAL DIAGNOSIS:  Left shoulder pain. Cardiac enzymes negative for  myocardial infarction. No critical disease at cath with no pericardial  effusion at echo.   SECONDARY DIAGNOSES:  1.  Status post myocardial infarction in October 2005 with Horizon stenting      times four to the  right coronary artery and kissing balloon angioplasty      to the posterior descending artery.  2.  Obesity.  3.  Tobacco use with ongoing pipe smoking.  4.  History of Charcot-Marie-Tooth syndrome.  5.  Gout.  6.  Hyperlipidemia.  7.  Probable chronic obstructive pulmonary disease.  8.  Reported history of percutaneous coronary intervention in 1999 or 200,      but no details available.  9.  Family history of coronary artery disease.   ALLERGIES:  No known drug allergies.   PROCEDURES:  1.  Cardiac catheterization.  2.  Coronary arteriogram.  3.  Left ventriculogram.  4.  2-D echocardiogram.   HOSPITAL COURSE:  Mr. Brandis is a 55 year old male with known coronary  artery disease. He is in the Horizon study and came to the [facility] lab on the day  of admission for an outpatient catheterization. During the procedure he  developed severe left shoulder pain which he stated he had at the time of  his MI, but not since. He had no significant in-stent restenosis and the  only residual disease was a 30% in the LAD. His EF was 40%. He was admitted  for observation.   A 2-D echocardiogram was performed to assess for pericardial  effusion, but  this was negative. His ejection fraction at echocardiogram was normal to  very mildly depressed. It was a technically difficult study and no other  details are available, although there was no pericardial effusion. His labs  were within normal limits including a BNP and the CMET. CBC was within  normal limits except for a slightly elevated white count.   Mr. Digilio's symptoms resolved and he rested well overnight. His cardiac  enzymes were negative for MI. A repeat CBC is pending at the time of  dictation, but if he does not have a significant increase in his white count  he is definitively considered stable for discharge with outpatient follow-up  arranged.   DISCHARGE INSTRUCTIONS:  1.  Activity level to be per the discharge instruction sheet.  2.  He is to stick to a low fat diet.  3.  He is to follow up with the PA on November 9th at 12:30 and with Dr.      Stanford Breed and Dr. Burnice Logan as  scheduled.   DISCHARGE MEDICATIONS:  1.  Aspirin 325 mg daily.  2.  Plavix 75 mg daily.  3.  Toprol-XL 50 mg daily.  4.  Zocor 40 mg daily.  5.  Colchicine 0.6 mg daily.  6.  Allopurinol 300 mg.  7.  Vicodin 5/500.  8.  Xanax 0.5.  9.  Nitroglycerin sublingual as prior to admission.      Rosaria Ferries, P.A. [facility]      Ethelle Lyon, M.D. [facility]  Electronically Signed    RB/MEDQ  D:  03/31/2005  T:  03/31/2005  Job:  [id number]   cc:   Kirk Ruths, M.D. [facility]  [address]  [address]  [place]  [place] [address]   Marletta Lor, M.D. [facility]  [place]  [place] [address]

## 2010-10-21 NOTE — Progress Notes (Signed)
Subjective:    Patient ID: [name], male    DOB: [birth year], 55 y.o.   MRN: [id number]  HPI CC: 1 mo f/u  DM - has been taking metformin 1078m bid.  118 cbg fasting 1 wk ago.  No lows.  No dizziness, CP/tightness, SOB.    Hydradenitis - appreciative of surgery care.  States skin starting to heal on daily bactrim bid.  Also benzoyl and antibacterial soap.  Told to call back if not improving.  HTN - only on toprol xl.  Tolerating well.  No HA, vision changes, CP/tightness, SOB, leg swelling.   CAD - s/p stents 2000, 2005.  No CP/tightness, SOB.  Weight down from 350lbs in last year.  No fevers/chills.  + night sweats.  1/2 ppd smoker.  No cough.  Brother recently dx with small cell lung CA Wt Readings from Last 3 Encounters:  10/21/10 290 lb 1.9 oz (131.598 kg)  09/21/10 297 lb (134.718 kg)  08/02/10 299 lb 8 oz (135.852 kg)   preventative: last CPE - 01/2010.  doesn't think due.  Past Medical History  Diagnosis Date  . Anxiety state, unspecified   . Backache, unspecified   . CAD (coronary artery disease)     x3 with stents last 2005, EF 40%  . Cardiomyopathy   . 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 LErling Cruz type 2 per pt  . COPD (chronic obstructive pulmonary disease)   . T2DM (type 2 diabetes mellitus)   . Disturbances of sensation of smell and taste   . Hepatic steatosis   . Gout   . Hidradenitis suppurativa 2011    followd by LLyndle Herrlich- daily bactrim, s/p intralesional steroid injection 10/2010  . HLD (hyperlipidemia)   . HTN (hypertension)   . History of MI (myocardial infarction)   . Obesity   . OSA (obstructive sleep apnea)   . Tobacco use disorder     cigar  . B12 deficiency   . Vitamin D deficiency   . Weight loss   . Allergic rhinitis   . Cervical spondylosis 05/2010    s/p surgery  . DDD (degenerative disc disease)   . Lumbar herniated disc   . Spinal stenosis     Pain meds prescribed by neurosurgery   Past Surgical History  Procedure Date    . Tonsillectomy and adenoidectomy 1972  . Lumbar disc surgery     L5-S1  . Cervical discectomy 01/2010    Anterior C4-7, cervical cord decompression and B foraminotomy ([place])(metal plate)  . Lumbar laminectomy 12/11    L2-5laminectomy/foraminotomy for stenosis ([place])  . Horizon study stenting x4 2000, 2005    in the RCA with kissing balloon angioplasty, of the PDA ostium-5 stents total per patient  . Cardiac catheterization 03/30/05    Left, Left ventriculography, coronary angiography, intravascular [place] RCA  . Myelogram     L5-S1 and L1-2 spondylosis  . Scrotal uKorea2/2012    hydradenitis - testes WNL, small R epididymal cyst, small L varicocele, no sinus tract mention    reports that he has been smoking Cigarettes and Pipe.  He has been smoking about .5 packs per day. He does not have any smokeless tobacco history on file. He reports that he does not drink alcohol or use illicit drugs. family history includes Aneurysm in his brother and mother; Cancer in his brother, father, and mother; Coronary artery disease in his brother; Diabetes in his mother; and Kidney disease in his mother. No Known  Allergies  Review of Systems Per HPI    Objective:   Physical Exam  Nursing note and vitals reviewed. Constitutional: He appears well-developed and well-nourished. No distress.  HENT:  Head: Normocephalic and atraumatic.  Mouth/Throat: Oropharynx is clear and moist. No oropharyngeal exudate.  Eyes: Conjunctivae are normal. Pupils are equal, round, and reactive to light. No scleral icterus.  Neck: Normal range of motion. Neck supple. Carotid bruit is not present. No thyromegaly present.  Cardiovascular: Normal rate, regular rhythm, normal heart sounds and intact distal pulses.   No murmur heard. Pulmonary/Chest: Effort normal and breath sounds normal. No respiratory distress. He has no wheezes. He has no rales.  Abdominal: Soft. Bowel sounds are normal. He exhibits no distension and no  mass. There is no tenderness. There is no rebound and no guarding.       No abd/renal bruits  Lymphadenopathy:    He has no cervical adenopathy.  Skin: Skin is warm. No rash noted.  Psychiatric: He has a normal mood and affect.   Diabetic foot exam: Normal inspection No skin breakdown No calluses  Normal DP/PT pulses Decreased sensation to light tough and monofilament throughout Nails normal         Assessment & Plan:

## 2010-11-02 ENCOUNTER — Other Ambulatory Visit: Payer: Self-pay | Admitting: Family Medicine

## 2010-11-07 ENCOUNTER — Other Ambulatory Visit: Payer: Self-pay | Admitting: Family Medicine

## 2010-11-07 DIAGNOSIS — E538 Deficiency of other specified B group vitamins: Secondary | ICD-10-CM

## 2010-11-07 DIAGNOSIS — E559 Vitamin D deficiency, unspecified: Secondary | ICD-10-CM

## 2010-11-09 ENCOUNTER — Other Ambulatory Visit: Payer: Medicare PPO

## 2010-11-09 ENCOUNTER — Ambulatory Visit: Payer: Medicare PPO

## 2010-11-11 ENCOUNTER — Ambulatory Visit (INDEPENDENT_AMBULATORY_CARE_PROVIDER_SITE_OTHER): Payer: Medicare PPO | Admitting: Family Medicine

## 2010-11-11 DIAGNOSIS — E538 Deficiency of other specified B group vitamins: Secondary | ICD-10-CM

## 2010-11-11 MED ORDER — CYANOCOBALAMIN 1000 MCG/ML IJ SOLN
1000.0000 ug | Freq: Once | INTRAMUSCULAR | Status: AC
  Administered 2010-11-11: 1000 ug via INTRAMUSCULAR

## 2010-11-11 NOTE — Progress Notes (Signed)
B12 injection given today during nurse visit.

## 2010-11-15 ENCOUNTER — Encounter: Payer: Self-pay | Admitting: Family Medicine

## 2010-11-21 ENCOUNTER — Telehealth: Payer: Self-pay | Admitting: *Deleted

## 2010-11-21 NOTE — Telephone Encounter (Signed)
Patient's wife called and said that the patient has been developing a rash on his chest for a while and now it is starting to appear on his back. He thinks it's coming from the abx that derm put him on since his mouth is starting to get a little raw as well. She wanted to know what to do. I advised to make an appt with dermatology since he is established with them now. I advised that if indeed it is coming from the abx, then they would need to determine the next step for treatment for him, but that it was not a typical sounding reaction to oral abx, since it was localized to just a few areas. She verbalized understanding and said they would make an appt.

## 2010-11-21 NOTE — Telephone Encounter (Signed)
Noted  

## 2010-11-24 ENCOUNTER — Telehealth: Payer: Self-pay | Admitting: *Deleted

## 2010-11-24 NOTE — Telephone Encounter (Signed)
Patient brought in form for disability placard. Form is on your desk.

## 2010-11-25 NOTE — Telephone Encounter (Signed)
filled out and placed in [name]'s box.

## 2010-11-25 NOTE — Telephone Encounter (Signed)
Patient notified and paperwork placed up front for pick up.

## 2010-12-01 ENCOUNTER — Other Ambulatory Visit: Payer: Self-pay | Admitting: Cardiology

## 2010-12-06 ENCOUNTER — Other Ambulatory Visit: Payer: Self-pay | Admitting: *Deleted

## 2010-12-06 MED ORDER — ZOLPIDEM TARTRATE 10 MG PO TABS: ORAL_TABLET | ORAL | Status: DC

## 2010-12-06 MED ORDER — ALPRAZOLAM 0.5 MG PO TABS: 0.5000 mg | ORAL_TABLET | Freq: Two times a day (BID) | ORAL | Status: DC

## 2010-12-06 NOTE — Telephone Encounter (Signed)
Rx called in as directed.   

## 2010-12-06 NOTE — Telephone Encounter (Signed)
plz phone in. 

## 2010-12-06 NOTE — Telephone Encounter (Signed)
Okay to refill both meds to [facility] [place]?

## 2010-12-07 ENCOUNTER — Other Ambulatory Visit: Payer: Self-pay | Admitting: Family Medicine

## 2010-12-14 ENCOUNTER — Ambulatory Visit: Payer: Medicare PPO

## 2010-12-20 ENCOUNTER — Ambulatory Visit: Payer: Medicare PPO

## 2010-12-27 ENCOUNTER — Ambulatory Visit (INDEPENDENT_AMBULATORY_CARE_PROVIDER_SITE_OTHER): Payer: Medicare PPO | Admitting: Family Medicine

## 2010-12-27 DIAGNOSIS — E538 Deficiency of other specified B group vitamins: Secondary | ICD-10-CM

## 2010-12-27 MED ORDER — CYANOCOBALAMIN 1000 MCG/ML IJ SOLN
1000.0000 ug | Freq: Once | INTRAMUSCULAR | Status: AC
  Administered 2010-12-27: 1000 ug via INTRAMUSCULAR

## 2010-12-27 NOTE — Progress Notes (Signed)
B12 injection given today during nurse visit.  No charge.

## 2010-12-30 ENCOUNTER — Other Ambulatory Visit: Payer: Self-pay | Admitting: Family Medicine

## 2010-12-30 NOTE — Telephone Encounter (Signed)
plz phone in. 

## 2011-01-03 NOTE — Telephone Encounter (Signed)
Patient asked that this be called in to [facility] [place] because they are not going to go through [facility] any longer.   Rx phoned to pharmacy.

## 2011-01-04 ENCOUNTER — Ambulatory Visit: Payer: Medicare PPO

## 2011-01-17 ENCOUNTER — Ambulatory Visit (INDEPENDENT_AMBULATORY_CARE_PROVIDER_SITE_OTHER): Payer: Medicare PPO | Admitting: *Deleted

## 2011-01-17 DIAGNOSIS — E538 Deficiency of other specified B group vitamins: Secondary | ICD-10-CM

## 2011-01-17 MED ORDER — CYANOCOBALAMIN 1000 MCG/ML IJ SOLN
1000.0000 ug | Freq: Once | INTRAMUSCULAR | Status: AC
  Administered 2011-01-17: 1000 ug via INTRAMUSCULAR

## 2011-01-23 ENCOUNTER — Ambulatory Visit: Payer: Medicare PPO | Admitting: Family Medicine

## 2011-02-07 ENCOUNTER — Ambulatory Visit (INDEPENDENT_AMBULATORY_CARE_PROVIDER_SITE_OTHER): Payer: Medicare PPO | Admitting: Family Medicine

## 2011-02-07 ENCOUNTER — Encounter: Payer: Self-pay | Admitting: Family Medicine

## 2011-02-07 VITALS — BP 114/60 | HR 88 | Temp 98.4°F | Wt 305.8 lb

## 2011-02-07 DIAGNOSIS — G4733 Obstructive sleep apnea (adult) (pediatric): Secondary | ICD-10-CM

## 2011-02-07 DIAGNOSIS — Z125 Encounter for screening for malignant neoplasm of prostate: Secondary | ICD-10-CM

## 2011-02-07 DIAGNOSIS — E538 Deficiency of other specified B group vitamins: Secondary | ICD-10-CM

## 2011-02-07 DIAGNOSIS — M109 Gout, unspecified: Secondary | ICD-10-CM

## 2011-02-07 DIAGNOSIS — L732 Hidradenitis suppurativa: Secondary | ICD-10-CM

## 2011-02-07 DIAGNOSIS — E785 Hyperlipidemia, unspecified: Secondary | ICD-10-CM

## 2011-02-07 DIAGNOSIS — E119 Type 2 diabetes mellitus without complications: Secondary | ICD-10-CM

## 2011-02-07 DIAGNOSIS — I2510 Atherosclerotic heart disease of native coronary artery without angina pectoris: Secondary | ICD-10-CM

## 2011-02-07 DIAGNOSIS — M549 Dorsalgia, unspecified: Secondary | ICD-10-CM

## 2011-02-07 DIAGNOSIS — I10 Essential (primary) hypertension: Secondary | ICD-10-CM

## 2011-02-07 MED ORDER — CYANOCOBALAMIN 1000 MCG/ML IJ SOLN
1000.0000 ug | Freq: Once | INTRAMUSCULAR | Status: AC
  Administered 2011-02-07: 1000 ug via INTRAMUSCULAR

## 2011-02-07 NOTE — Assessment & Plan Note (Signed)
Stable. Continue current meds.   

## 2011-02-07 NOTE — Assessment & Plan Note (Signed)
Chronic issue, s/p 4+ spine surgeries. Pain meds from neurosurg currently.

## 2011-02-07 NOTE — Assessment & Plan Note (Signed)
Check uric acid level next blood draw

## 2011-02-07 NOTE — Assessment & Plan Note (Signed)
Recheck FLP when returns fasting. Not on statin.

## 2011-02-07 NOTE — Assessment & Plan Note (Signed)
States off CPAP, improved with weight loss.

## 2011-02-07 NOTE — Assessment & Plan Note (Signed)
Reviewed no known cure, goal is control of disease process. Advised f/u with derm if not improving. On bactrim bid, as well as benzoyl and cleocin, as well as has received intralesional steroids from derm.

## 2011-02-07 NOTE — Assessment & Plan Note (Signed)
Good control last visit, check when returns fasting for blood work. Has tolerated metformin well.

## 2011-02-07 NOTE — Progress Notes (Signed)
Subjective:    Patient ID: [name], male    DOB: 05-16-1956, 55 y.o.   MRN: [id number]  HPI CC: 3 mo f/u  still in pain from back.  Due for CPE, will schedule next visit.  1. [facility] - good control of bp on low dose metoprolol.  Actually more on this medicine for heart than for BP control (h/o CAD, last stent placed 2005).  2. Weight - up 15lbs in last 4 months.  Continues to endorse altered sense of taste and smell. Wt Readings from Last 3 Encounters:  02/07/11 305 lb 12 oz (138.687 kg)  10/21/10 290 lb 1.9 oz (131.598 kg)  09/21/10 297 lb (134.718 kg)   3. DM - blood sugar 109 checked today.  Good control in past on metformin.  4. Hydradenitis - followed by derm.  Taking bactrim bid as well as benzoyl peroxide and clindamycin cream.  some days with flares.  Seen by Dr. Erling Cruz and told some atrophy in one arm and legs.  No records from Dr. Erling Cruz as of yet.  Will request to send.  Medications and allergies reviewed and updated in chart.  Past histories reviewed and updated if relevant as below. Patient Active Problem List  Diagnoses  . HYPERLIPIDEMIA  . GOUT  . OBESITY  . ANXIETY  . TOBACCO ABUSE  . CHARCOT-MARIE-TOOTH DISEASE  . HYPERTENSION  . MYOCARDIAL INFARCTION  . CAD  . CARDIOMYOPATHY  . COPD  . Hidradenitis  . BACK PAIN  . DYSPNEA  . OBSTRUCTIVE SLEEP APNEA  . DIABETES MELLITUS  . VITAMIN D DEFICIENCY  . Disturbances of sensation of smell and taste  . WEIGHT LOSS, RECENT  . VITAMIN B12 DEFICIENCY   Past Medical History  Diagnosis Date  . Anxiety state, unspecified   . Backache, unspecified   . CAD (coronary artery disease)     x3 with stents last 2005, EF 40%  . Cardiomyopathy   . 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 COPD (chronic obstructive pulmonary disease)   . T2DM (type 2 diabetes mellitus)   . Disturbances of sensation of smell and taste   . Hepatic steatosis   . Gout   . Hidradenitis suppurativa 2011    followd  by Lyndle Herrlich - daily bactrim, s/p intralesional steroid injection 10/2010  . HLD (hyperlipidemia)   . HTN (hypertension)   . History of MI (myocardial infarction)   . Obesity   . OSA (obstructive sleep apnea)   . Tobacco use disorder     cigar  . B12 deficiency   . Vitamin D deficiency   . Weight loss   . Allergic rhinitis   . Cervical spondylosis 05/2010    s/p surgery  . DDD (degenerative disc disease)   . Lumbar herniated disc   . Spinal stenosis     Pain meds prescribed by neurosurgery   Past Surgical History  Procedure Date  . Tonsillectomy and adenoidectomy 1972  . Lumbar disc surgery     L5-S1  . Cervical discectomy 01/2010    Anterior C4-7, cervical cord decompression and B foraminotomy ([place])(metal plate)  . Lumbar laminectomy 12/11    L2-5laminectomy/foraminotomy for stenosis ([place])  . Horizon study stenting x4 2000, 2005    in the RCA with kissing balloon angioplasty, of the PDA ostium-5 stents total per patient  . Cardiac catheterization 03/30/05    Left, Left ventriculography, coronary angiography, intravascular [place] of RCA  . Myelogram  L5-S1 and L1-2 spondylosis  . Scrotal US 07/2010    hydradenitis - testes WNL, small R epididymal cyst, small L varicocele, no sinus tract mention   History  Substance Use Topics  . Smoking status: Current Everyday Smoker -- 0.5 packs/day    Types: Cigarettes, Pipe  . Smokeless tobacco: Not on file  . Alcohol Use: No   Family History  Problem Relation Age of Onset  . Cancer Mother     colon  . Diabetes Mother   . Kidney disease Mother   . Aneurysm Mother     AAA  . Cancer Father     skin  . Cancer Brother     skin  . Coronary artery disease Brother   . Cancer Brother     small cell lung cancer  . Aneurysm Brother     AAA   No Known Allergies Current Outpatient Prescriptions on File Prior to Visit  Medication Sig Dispense Refill  . albuterol (PROVENTIL HFA) 108 (90 BASE) MCG/ACT inhaler Inhale 2 puffs  into the lungs as directed.        [name] ALPRAZolam (XANAX) 0.5 MG tablet TAKE 1 TABLET TWICE DAILY  60 tablet  1  . Benzoyl Peroxide 3.5 % LIQD Use OTC wash daily      . Cholecalciferol (VITAMIN D) 2000 UNITS CAPS Take 1 capsule (2,000 Units total) by mouth daily.  30 capsule  11  . clindamycin (CLEOCIN T) 1 % external solution Apply topically 2 (two) times daily.  30 mL  0  . diazepam (VALIUM) 10 MG tablet Take 10 mg by mouth every 12 (twelve) hours as needed.        . febuxostat (ULORIC) 40 MG tablet Take 40 mg by mouth daily.       . folic acid (FOLVITE) 1 MG tablet Take 1 tablet (1 mg total) by mouth daily.  90 tablet  3  . HYDROcodone-acetaminophen (NORCO) 10-325 MG per tablet Take 1 tablet by mouth every 4 (four) hours as needed.        [name] ibuprofen (ADVIL,MOTRIN) 800 MG tablet Take 800 mg by mouth every 8 (eight) hours as needed.        . metFORMIN (GLUCOPHAGE) 500 MG tablet Take 1 tablet (500 mg total) by mouth 2 (two) times daily with a meal.      . metoprolol (TOPROL-XL) 50 MG 24 hr tablet TAKE 1/2 TABLET EVERY DAY  30 tablet  1  . sulfamethoxazole-trimethoprim (BACTRIM DS) 800-160 MG per tablet Take 1 tablet by mouth 2 (two) times daily.  20 tablet  0  . zolpidem (AMBIEN) 10 MG tablet TAKE 1 TABLET AT BEDTIME  30 tablet  0   Current Facility-Administered Medications on File Prior to Visit  Medication Dose Route Frequency Provider Last Rate Last Dose  . cyanocobalamin ((VITAMIN B-12)) injection 1,000 mcg  1,000 mcg Intramuscular Q30 days Ria Bush, MD   1,000 mcg at 09/08/10 1458   Review of Systems Per HPI    Objective:   Physical Exam  Nursing note and vitals reviewed. Constitutional: He appears well-developed and well-nourished. No distress.       obese  HENT:  Head: Normocephalic and atraumatic.  Mouth/Throat: Oropharynx is clear and moist. No oropharyngeal exudate.  Eyes: Conjunctivae and EOM are normal. Pupils are equal, round, and reactive to light. No scleral  icterus.  Neck: Normal range of motion. Neck supple.  Cardiovascular: Normal rate, regular rhythm, normal heart sounds and intact distal pulses.  No murmur heard. Pulmonary/Chest: Effort normal and breath sounds normal. No respiratory distress. He has no wheezes. He has no rales.  Musculoskeletal: He exhibits no edema.  Lymphadenopathy:    He has no cervical adenopathy.  Skin: Skin is warm and dry.  Psychiatric: He has a normal mood and affect.          Assessment & Plan:

## 2011-02-07 NOTE — Assessment & Plan Note (Signed)
Due for recheck with cards.  Advised set up. Doing well from cards standpoint, denies CP,tightness, SOB.

## 2011-02-07 NOTE — Patient Instructions (Signed)
Return at your convenience fasting for blood work and afterwards for physical (PM day). Sign release of information for records from Dr. Erling Cruz. Good to see you today, call us with questions

## 2011-02-08 ENCOUNTER — Ambulatory Visit: Payer: Medicare PPO

## 2011-02-09 ENCOUNTER — Other Ambulatory Visit: Payer: Self-pay | Admitting: Family Medicine

## 2011-02-09 NOTE — Telephone Encounter (Signed)
Rx called in as directed.   

## 2011-02-09 NOTE — Telephone Encounter (Signed)
Okay to refill? They are going to be out of town when he is due for his next refill and they have been having delivery issues with this mail order company. He was wanting to go ahead and get them approved so that if there was any problems, they could be rectified prior to going out of town.

## 2011-02-09 NOTE — Telephone Encounter (Signed)
Ok to refill.  plz phone in

## 2011-02-14 ENCOUNTER — Other Ambulatory Visit (INDEPENDENT_AMBULATORY_CARE_PROVIDER_SITE_OTHER): Payer: Medicare PPO

## 2011-02-14 ENCOUNTER — Other Ambulatory Visit: Payer: Self-pay | Admitting: *Deleted

## 2011-02-14 DIAGNOSIS — E119 Type 2 diabetes mellitus without complications: Secondary | ICD-10-CM

## 2011-02-14 DIAGNOSIS — Z125 Encounter for screening for malignant neoplasm of prostate: Secondary | ICD-10-CM

## 2011-02-14 DIAGNOSIS — E785 Hyperlipidemia, unspecified: Secondary | ICD-10-CM

## 2011-02-14 DIAGNOSIS — E559 Vitamin D deficiency, unspecified: Secondary | ICD-10-CM

## 2011-02-14 DIAGNOSIS — E538 Deficiency of other specified B group vitamins: Secondary | ICD-10-CM

## 2011-02-14 DIAGNOSIS — M109 Gout, unspecified: Secondary | ICD-10-CM

## 2011-02-14 LAB — HEMOGLOBIN A1C: Hgb A1c MFr Bld: 6.1 % (ref 4.6–6.5)

## 2011-02-14 MED ORDER — ALBUTEROL SULFATE (2.5 MG/3ML) 0.083% IN NEBU: 2.5000 mg | INHALATION_SOLUTION | Freq: Four times a day (QID) | RESPIRATORY_TRACT | Status: DC | PRN

## 2011-02-14 NOTE — Telephone Encounter (Signed)
Patient came in for labs today. His wife asked if he could have a refill on his albuterol for his nebulizer. She said she didn't think they had told us he had one, but that it should be in the notes from his previous doctor. I told her I would check with you and let her know if it was sent in. [facility] [place]

## 2011-02-14 NOTE — Telephone Encounter (Signed)
Sent in albuterol neb. Please check to see why they want neb instead of inhaler for (PA purposes in case needed)

## 2011-02-15 LAB — VITAMIN B12: Vitamin B-12: 527 pg/mL (ref 211–911)

## 2011-02-15 LAB — PSA: PSA: 0.24 ng/mL (ref 0.10–4.00)

## 2011-02-15 LAB — MICROALBUMIN / CREATININE URINE RATIO: Microalb, Ur: 2.3 mg/dL — ABNORMAL HIGH (ref 0.0–1.9)

## 2011-02-15 LAB — COMPREHENSIVE METABOLIC PANEL
Albumin: 3.3 g/dL — ABNORMAL LOW (ref 3.5–5.2)
BUN: 17 mg/dL (ref 6–23)
Calcium: 8.5 mg/dL (ref 8.4–10.5)
Chloride: 106 mEq/L (ref 96–112)
Glucose, Bld: 94 mg/dL (ref 70–99)
Potassium: 4.3 mEq/L (ref 3.5–5.1)

## 2011-02-15 LAB — FOLATE: Folate: 15.5 ng/mL (ref >5.9)

## 2011-02-15 LAB — LIPID PANEL: HDL: 27.6 mg/dL — ABNORMAL LOW (ref >39.00)

## 2011-02-15 NOTE — Telephone Encounter (Signed)
Noted. Thanks. Will remove alb inhaler from list.

## 2011-02-15 NOTE — Telephone Encounter (Signed)
Patient notified of Rx. He said that he doesn't use the inhaler anymore because it too expensive and he didn't really need it enough to justify paying almost $50 per inhaler. He said he uses the nebulizer instead of the inhaler just once in blue moon when he has an attack because it is much more affordable.

## 2011-03-06 LAB — URINE MICROSCOPIC-ADD ON

## 2011-03-06 LAB — URINALYSIS, ROUTINE W REFLEX MICROSCOPIC
Glucose, UA: NEGATIVE
Ketones, ur: NEGATIVE
Specific Gravity, Urine: 1.021
pH: 5.5

## 2011-03-08 ENCOUNTER — Ambulatory Visit: Payer: Medicare PPO

## 2011-03-10 ENCOUNTER — Ambulatory Visit (INDEPENDENT_AMBULATORY_CARE_PROVIDER_SITE_OTHER): Payer: Medicare PPO | Admitting: *Deleted

## 2011-03-10 DIAGNOSIS — E538 Deficiency of other specified B group vitamins: Secondary | ICD-10-CM

## 2011-03-10 MED ORDER — CYANOCOBALAMIN 1000 MCG/ML IJ SOLN
1000.0000 ug | Freq: Once | INTRAMUSCULAR | Status: AC
  Administered 2011-03-10: 1000 ug via INTRAMUSCULAR

## 2011-03-14 ENCOUNTER — Ambulatory Visit (INDEPENDENT_AMBULATORY_CARE_PROVIDER_SITE_OTHER): Payer: Medicare PPO | Admitting: Family Medicine

## 2011-03-14 ENCOUNTER — Encounter: Payer: Self-pay | Admitting: Family Medicine

## 2011-03-14 VITALS — BP 126/78 | HR 88 | Temp 98.3°F | Wt 306.2 lb

## 2011-03-14 DIAGNOSIS — J441 Chronic obstructive pulmonary disease with (acute) exacerbation: Secondary | ICD-10-CM

## 2011-03-14 DIAGNOSIS — F17200 Nicotine dependence, unspecified, uncomplicated: Secondary | ICD-10-CM

## 2011-03-14 DIAGNOSIS — E559 Vitamin D deficiency, unspecified: Secondary | ICD-10-CM

## 2011-03-14 DIAGNOSIS — R21 Rash and other nonspecific skin eruption: Secondary | ICD-10-CM

## 2011-03-14 MED ORDER — PREDNISONE 50 MG PO TABS: 50.0000 mg | ORAL_TABLET | Freq: Every day | ORAL | Status: DC

## 2011-03-14 MED ORDER — DOXYCYCLINE HYCLATE 100 MG PO CAPS: 100.0000 mg | ORAL_CAPSULE | Freq: Two times a day (BID) | ORAL | Status: AC

## 2011-03-14 MED ORDER — ALBUTEROL SULFATE (5 MG/ML) 0.5% IN NEBU
2.5000 mg | INHALATION_SOLUTION | Freq: Once | RESPIRATORY_TRACT | Status: AC
  Administered 2011-03-14: 2.5 mg via RESPIRATORY_TRACT

## 2011-03-14 MED ORDER — VITAMIN D (ERGOCALCIFEROL) 1.25 MG (50000 UNIT) PO CAPS: 50000.0000 [IU] | ORAL_CAPSULE | ORAL | Status: DC

## 2011-03-14 MED ORDER — IPRATROPIUM BROMIDE 0.02 % IN SOLN
0.5000 mg | Freq: Once | RESPIRATORY_TRACT | Status: AC
  Administered 2011-03-14: 0.5 mg via RESPIRATORY_TRACT

## 2011-03-14 NOTE — Assessment & Plan Note (Signed)
?   Allergic reaction although no new meds. Steroids should help with this. If worsening, consider tinea capitis/corporis.  Discussed to notify me if worsening.

## 2011-03-14 NOTE — Progress Notes (Signed)
  Subjective:    Patient ID: [name], male    DOB: 04/12/1956, 55 y.o.   MRN: [id number]  HPI CC: sick visit  Was here for CPE today, but changed to sick visit.  Went to beach last week, then when returned started having trouble breathing (going on 1 wk now).  Increased coughing, not too productive but sounds very rattly.  Increased SOB.  Increased wheezing.  Has been using albuterol daily.  Both pt and friend (who he went to beach with) smoke.  No fevers/chills, abd pain, n/v.  H/o COPD, smoker (1/2 ppd).  Tried patches as well as wellbutrin in past.  Taking albuterol as needed when feeling well.  Also with new rash on face, chest and back.  No new medicines, no change with detergents, soaps, shampoos, lotions, detergents.    Hidradenitis seems to be improving.  Has appt with derm beginning of next month.  Past Medical History  Diagnosis Date  . Anxiety state, unspecified   . Backache, unspecified   . CAD (coronary artery disease)     x3 with stents last 2005, EF 40%  . Cardiomyopathy   . 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 COPD (chronic obstructive pulmonary disease)   . T2DM (type 2 diabetes mellitus)   . Disturbances of sensation of smell and taste   . Hepatic steatosis   . Gout   . Hidradenitis suppurativa 2011    followd by Lyndle Herrlich - daily bactrim, s/p intralesional steroid injection 10/2010  . HLD (hyperlipidemia)   . HTN (hypertension)   . History of MI (myocardial infarction)   . Obesity   . OSA (obstructive sleep apnea)   . Tobacco use disorder     cigar  . B12 deficiency   . Vitamin D deficiency   . Weight loss   . Allergic rhinitis   . Cervical spondylosis 05/2010    s/p surgery  . DDD (degenerative disc disease)   . Lumbar herniated disc   . Spinal stenosis     Pain meds prescribed by neurosurgery   Review of Systems Per HPI    Objective:   Physical Exam  Nursing note and vitals reviewed. Constitutional: He  appears well-developed and well-nourished.  HENT:  Head: Normocephalic and atraumatic.  Mouth/Throat: Oropharynx is clear and moist. No oropharyngeal exudate.  Eyes: Conjunctivae and EOM are normal. Pupils are equal, round, and reactive to light. No scleral icterus.  Neck: Neck supple.  Cardiovascular: Normal rate, regular rhythm, normal heart sounds and intact distal pulses.   No murmur heard. Pulmonary/Chest: No respiratory distress. He has wheezes. He has no rales. He exhibits no tenderness.       Prolonged expiratory phase, insp and exp wheezing, decreased air movement.  Lymphadenopathy:    He has no cervical adenopathy.  Skin: Skin is warm and dry. Rash noted.          Erythematous scaly serpentine rash on face as well as mid chest and resolving patch of scaly erythema mid back   After alb/atrovent neb, improved air movement.     Assessment & Plan:

## 2011-03-14 NOTE — Assessment & Plan Note (Signed)
Increased cough, SOB, wheezing, wet sounding cough. COPD exacerbation, treat with alb/atrovent neb.  Afterwards improved air movement. Decreased O2 sat to 93% but nontoxic, no resp distress. Treat with doxycycline as well as prednisone for 7d.  Schedule albuterol. Discussed red flags to ER - worsening cough, sob, despite treatment. Return in 2 mo for spirometry.

## 2011-03-14 NOTE — Assessment & Plan Note (Signed)
Strongly encouraged cessation, discussed patch.

## 2011-03-14 NOTE — Patient Instructions (Addendum)
You have COPD exacerbation. Treat with antibiotics (hold bactrim while on these), course of steroids, as well as albuterol regularly for next 2-3 days. For rash - we will monitor for now.  Steroids should help as well.  If getting worse, let me know.  May be ringworm. If breathing worsening, please return or go to ER. You need to quit smoking.  Try patches.  1/2 patch/day if you're smoking 1/2 pack/day. Get plenty of rest and fluid. Return in 2 months for spirometry to check on COPD (once you're feeling better). Return for complete physical as well as today we did not do.

## 2011-03-14 NOTE — Assessment & Plan Note (Signed)
Start 50k units weekly x8 wks, then start daily supplementation

## 2011-03-17 ENCOUNTER — Encounter: Payer: Self-pay | Admitting: Family Medicine

## 2011-04-03 ENCOUNTER — Telehealth: Payer: Self-pay | Admitting: *Deleted

## 2011-04-03 ENCOUNTER — Other Ambulatory Visit: Payer: Self-pay | Admitting: Family Medicine

## 2011-04-04 ENCOUNTER — Encounter: Payer: Self-pay | Admitting: Family Medicine

## 2011-04-04 ENCOUNTER — Ambulatory Visit (INDEPENDENT_AMBULATORY_CARE_PROVIDER_SITE_OTHER): Payer: Medicare PPO | Admitting: Family Medicine

## 2011-04-04 ENCOUNTER — Other Ambulatory Visit: Payer: Self-pay | Admitting: Family Medicine

## 2011-04-04 VITALS — BP 128/78 | HR 92 | Temp 98.3°F | Wt 304.4 lb

## 2011-04-04 DIAGNOSIS — F17200 Nicotine dependence, unspecified, uncomplicated: Secondary | ICD-10-CM

## 2011-04-04 DIAGNOSIS — L732 Hidradenitis suppurativa: Secondary | ICD-10-CM

## 2011-04-04 DIAGNOSIS — R21 Rash and other nonspecific skin eruption: Secondary | ICD-10-CM

## 2011-04-04 DIAGNOSIS — J441 Chronic obstructive pulmonary disease with (acute) exacerbation: Secondary | ICD-10-CM

## 2011-04-04 MED ORDER — NICOTINE 14 MG/24HR TD PT24: 1.0000 | MEDICATED_PATCH | TRANSDERMAL | Status: DC

## 2011-04-04 MED ORDER — NICOTINE 7 MG/24HR TD PT24: 1.0000 | MEDICATED_PATCH | TRANSDERMAL | Status: DC

## 2011-04-04 MED ORDER — CLOTRIMAZOLE 1 % EX CREA: TOPICAL_CREAM | Freq: Two times a day (BID) | CUTANEOUS | Status: DC

## 2011-04-04 MED ORDER — PREDNISONE 20 MG PO TABS: ORAL_TABLET | ORAL | Status: DC

## 2011-04-04 NOTE — Assessment & Plan Note (Addendum)
Lungs overall clear on exam today, doubt continued infection.  Some wheezing on exam.  Good O2 sat, no need for CXR today. Will prolong steroid course - do taper. Keep appt in 1 1/2 mo for spirometry. Advised to return if fever >101, worsening cough or breathing, or not improving as expected.  Consider CXR.

## 2011-04-04 NOTE — Telephone Encounter (Signed)
Rx called into [facility] as directed.

## 2011-04-04 NOTE — Patient Instructions (Addendum)
For breathing, another steroid course - taper down. I'd restart the bactrim for the groin. For skin rash - clotrimazole as it could be fungal infection but surprising it's improved with steroids. If worsening breathing or any fever, please return to see me. Good to see you today.  Call us with questions.

## 2011-04-04 NOTE — Telephone Encounter (Signed)
Ok to refill. plz find out where pt wants sent and then phone in. thanks

## 2011-04-04 NOTE — Progress Notes (Signed)
  Subjective:    Patient ID: [name], male    DOB: [birth year], 55 y.o.   MRN: [id number]  HPI CC: f/u chest congestion  Seen here 03/14/2011 with dx COPD exac, treated with doxycycline x 10 days and prednisone 36m x 7days, scheduled albuterol.  Reports compliance with treatment.  Plan was to return in 2 mo for spirometry.  Improved with treatment, then 2-3 days ago started feeling worse.  Continues coughing, wet sounding.  Rarely sleeping with oxygen.  H/o OSA, doesn't use CPAP.  Endorses subjective fever.  More chest congestion than head congestion.  Mild headache.  Smoking 1/2-1 ppd - hasn't bought patches - too expensive.  Requests we send in refill for this.  Hydradenitis - bleeding, worse pain.  Did stop bactrim while on doxycycline.  Never restarted.  Has appt Tuesday with derm.  Saw Dr. BJoya Salm- sent to ENT for trouble with smell.  [place]  Changed from vicodin to percocets.  Making him too sleepy but helping with pain some.  On 7.5/325mg dose.  Brother with small cell carcinoma.  Uncle just passed away from small cell carcinoma.  Skin rash returning - states improved with steroids.  Review of Systems   Per HPI    Objective:   Physical Exam  Nursing note and vitals reviewed. Constitutional: He appears well-developed and well-nourished.  HENT:  Head: Normocephalic and atraumatic.  Mouth/Throat: Oropharynx is clear and moist. No oropharyngeal exudate.  Eyes: Conjunctivae and EOM are normal. Pupils are equal, round, and reactive to light. No scleral icterus.  Neck: Neck supple.  Cardiovascular: Normal rate, regular rhythm, normal heart sounds and intact distal pulses.   No murmur heard. Pulmonary/Chest: No respiratory distress. He has wheezes (minimal exp wheezing). He has no rales. He exhibits no tenderness.       Overall clear, normal wob.  Musculoskeletal: He exhibits no edema.  Lymphadenopathy:    He has no cervical adenopathy.  Skin:  Skin is warm and dry. Rash noted.          Erythematous scaly rash on face.  mid chest with annular erythematous patches, central clearing.  Psychiatric: He has a normal mood and affect.      Assessment & Plan:

## 2011-04-04 NOTE — Assessment & Plan Note (Signed)
Sent in nicotine patches per pt rec.

## 2011-04-04 NOTE — Assessment & Plan Note (Signed)
Today chest rash looking more consistent with ringworm, however atypical in that it seemed to improve on steroids. Given concern for ringworm, will start clotrimazole cream.

## 2011-04-04 NOTE — Assessment & Plan Note (Signed)
Did not evaluate today. Recommend restart bactrim. Has f/u scheduled for derm next week.

## 2011-04-04 NOTE — Telephone Encounter (Signed)
plz phone in. 

## 2011-04-04 NOTE — Telephone Encounter (Signed)
Ok to refill 

## 2011-04-05 ENCOUNTER — Other Ambulatory Visit: Payer: Self-pay | Admitting: *Deleted

## 2011-04-05 MED ORDER — CLOTRIMAZOLE 1 % EX CREA: TOPICAL_CREAM | Freq: Two times a day (BID) | CUTANEOUS | Status: AC

## 2011-04-05 MED ORDER — NICOTINE 14 MG/24HR TD PT24: 1.0000 | MEDICATED_PATCH | TRANSDERMAL | Status: AC

## 2011-04-05 MED ORDER — PREDNISONE 20 MG PO TABS: ORAL_TABLET | ORAL | Status: DC

## 2011-04-05 NOTE — Telephone Encounter (Signed)
[facility] called [facility]. Sent as requested.

## 2011-04-05 NOTE — Telephone Encounter (Signed)
Rx called into [facility] as directed.

## 2011-04-06 NOTE — Telephone Encounter (Signed)
Opened in error

## 2011-04-18 ENCOUNTER — Other Ambulatory Visit: Payer: Self-pay | Admitting: *Deleted

## 2011-04-18 MED ORDER — ZOLPIDEM TARTRATE 10 MG PO TABS: 10.0000 mg | ORAL_TABLET | Freq: Every evening | ORAL | Status: DC | PRN

## 2011-04-18 NOTE — Telephone Encounter (Signed)
Refill needed for mail order.

## 2011-04-18 NOTE — Telephone Encounter (Signed)
Please phone in

## 2011-04-19 ENCOUNTER — Ambulatory Visit: Payer: Medicare PPO

## 2011-04-20 NOTE — Telephone Encounter (Signed)
Rx called in as directed.   

## 2011-04-25 ENCOUNTER — Other Ambulatory Visit: Payer: Self-pay | Admitting: *Deleted

## 2011-05-02 ENCOUNTER — Other Ambulatory Visit: Payer: Self-pay | Admitting: *Deleted

## 2011-05-02 MED ORDER — ALPRAZOLAM 0.5 MG PO TABS: ORAL_TABLET | ORAL | Status: DC

## 2011-05-02 NOTE — Telephone Encounter (Signed)
This med is not one that I send in 70moat a time, rather 1 mo.  May phone in with #1 RF.

## 2011-05-02 NOTE — Telephone Encounter (Signed)
Pt is asking that a 90 day supply be sent to [facility].

## 2011-05-03 NOTE — Telephone Encounter (Signed)
Rx called in as directed. Patient notified no 90 day supply only 30 day. Patient verbalized understanding.

## 2011-05-05 ENCOUNTER — Ambulatory Visit: Payer: Medicare PPO | Admitting: Family Medicine

## 2011-05-11 ENCOUNTER — Encounter: Payer: Self-pay | Admitting: Family Medicine

## 2011-05-11 ENCOUNTER — Ambulatory Visit (INDEPENDENT_AMBULATORY_CARE_PROVIDER_SITE_OTHER): Payer: Medicare PPO | Admitting: Family Medicine

## 2011-05-11 VITALS — BP 126/70 | HR 104 | Temp 98.9°F | Wt 313.5 lb

## 2011-05-11 DIAGNOSIS — Z23 Encounter for immunization: Secondary | ICD-10-CM

## 2011-05-11 DIAGNOSIS — J441 Chronic obstructive pulmonary disease with (acute) exacerbation: Secondary | ICD-10-CM

## 2011-05-11 DIAGNOSIS — E538 Deficiency of other specified B group vitamins: Secondary | ICD-10-CM

## 2011-05-11 MED ORDER — CYANOCOBALAMIN 1000 MCG/ML IJ SOLN
1000.0000 ug | Freq: Once | INTRAMUSCULAR | Status: AC
  Administered 2011-05-11: 1000 ug via INTRAMUSCULAR

## 2011-05-11 NOTE — Patient Instructions (Signed)
You need to quit smoking Spirometry next week. Good to see you today. Flu shot and B12 shot today.

## 2011-05-11 NOTE — Progress Notes (Signed)
  Subjective:    Patient ID: [name], male    DOB: July 27, 1955, 55 y.o.   MRN: [id number]  HPI CC: 8mof/u  Presents today as f/u for COPD exac, would like B12 and flu shot today.  Seen here 03/14/2011 with dx COPD exac, treated with doxycycline x 10 days and prednisone 543mx 7days, scheduled albuterol. Reports compliance with treatment. Plan was to return in 2 mo for spirometry. Improved with treatment, then seen gain 10/30 with return of wheezing, so prolonged steroid taper.  Feels breathing improved.  Coughing improved.  Smoking 1/2-1 ppd - hasn't bought patches - "they're at the drugstore"  H/o OSA, doesn't use CPAP.  Hydradenitis - bleeding.  This causes significant frustration.  Back on sulfa.  Following with dermatologist.  Saw Dr. BoJoya Salm sent to ENT for trouble with smell.  To see Dr. MaHassell Donen Gr[place]ben Changed from vicodin to roxycodone.  Helps pain intermittently.  Brother with small cell carcinoma. Uncle just passed away from small cell carcinoma.  Pt knows he needs to quit smoking.  Skin rash returning - states improved with steroids.  Review of Systems Per HPI    Objective:   Physical Exam  Nursing note and vitals reviewed. Constitutional: He appears well-developed and well-nourished. No distress.  HENT:  Head: Normocephalic and atraumatic.  Mouth/Throat: Oropharynx is clear and moist. No oropharyngeal exudate.  Eyes: Conjunctivae and EOM are normal. Pupils are equal, round, and reactive to light. No scleral icterus.  Neck: Normal range of motion. Neck supple.  Cardiovascular: Normal rate, regular rhythm, normal heart sounds and intact distal pulses.   No murmur heard. Pulmonary/Chest: Effort normal. No respiratory distress. He has no wheezes.       coarse  Lymphadenopathy:    He has no cervical adenopathy.  Skin: Skin is warm and dry.  Psychiatric: He has a normal mood and affect.      Assessment & Plan:

## 2011-05-11 NOTE — Assessment & Plan Note (Signed)
Resolved. To return next week for scheduled spirometry Flu shot today.  utd pneumonia shot.

## 2011-05-18 ENCOUNTER — Ambulatory Visit: Payer: Medicare PPO | Admitting: Family Medicine

## 2011-05-29 ENCOUNTER — Other Ambulatory Visit: Payer: Self-pay | Admitting: Family Medicine

## 2011-05-29 NOTE — Telephone Encounter (Signed)
plz phone in. 

## 2011-05-31 ENCOUNTER — Other Ambulatory Visit: Payer: Self-pay | Admitting: Internal Medicine

## 2011-05-31 MED ORDER — ZOLPIDEM TARTRATE 10 MG PO TABS: 10.0000 mg | ORAL_TABLET | Freq: Every evening | ORAL | Status: DC | PRN

## 2011-05-31 MED ORDER — ALPRAZOLAM 0.5 MG PO TABS: ORAL_TABLET | ORAL | Status: DC

## 2011-05-31 NOTE — Telephone Encounter (Signed)
Patient is out of meds and would a month supply called to [facility] and the rest to [facility].  Please advise.

## 2011-05-31 NOTE — Telephone Encounter (Signed)
Patient notified that both RX's were at [facility].

## 2011-05-31 NOTE — Telephone Encounter (Signed)
Medication phoned to [facility] as instructed. Pt had called back and out of med wanted called to [facility]. [name] had not been  Able to reach [facility] and I called rx [facility] [place]. Please also see note 05/31/11.

## 2011-05-31 NOTE — Telephone Encounter (Signed)
[name] had not been able to reach [facility] and since pt is out of med I called in Ambien10 mg #30 x 0 under Dr Danise Mina name and Xanax 0.5 mg #60 x 0 to [facility] [place]. Left v.m message for pt to call back.

## 2011-05-31 NOTE — Telephone Encounter (Signed)
Chart says [name] was done 2 d ago?  Will refil the xanax Px written for call in

## 2011-06-02 ENCOUNTER — Telehealth: Payer: Self-pay | Admitting: Internal Medicine

## 2011-06-02 NOTE — Telephone Encounter (Signed)
Patient needs a Rx for diabetic shoes the [facility] is coming to his house to fit him for the shoes and in order for his insurance to pay he needs a Rx.

## 2011-06-05 NOTE — Telephone Encounter (Signed)
Patient notified and Rx placed up front for pick up.

## 2011-06-05 NOTE — Telephone Encounter (Signed)
Filled out paper script and routed to [facility].

## 2011-06-21 ENCOUNTER — Ambulatory Visit: Payer: Medicare PPO

## 2011-06-22 ENCOUNTER — Ambulatory Visit: Payer: Medicare PPO

## 2011-06-28 ENCOUNTER — Ambulatory Visit: Payer: Medicare PPO | Admitting: Cardiology

## 2011-06-29 ENCOUNTER — Encounter: Payer: Self-pay | Admitting: Family Medicine

## 2011-06-29 ENCOUNTER — Telehealth: Payer: Self-pay | Admitting: *Deleted

## 2011-06-29 ENCOUNTER — Ambulatory Visit (INDEPENDENT_AMBULATORY_CARE_PROVIDER_SITE_OTHER): Payer: Medicare PPO | Admitting: Family Medicine

## 2011-06-29 VITALS — BP 126/70 | HR 74 | Temp 98.1°F | Wt 308.8 lb

## 2011-06-29 DIAGNOSIS — M545 Low back pain, unspecified: Secondary | ICD-10-CM

## 2011-06-29 DIAGNOSIS — F17200 Nicotine dependence, unspecified, uncomplicated: Secondary | ICD-10-CM

## 2011-06-29 DIAGNOSIS — E538 Deficiency of other specified B group vitamins: Secondary | ICD-10-CM

## 2011-06-29 DIAGNOSIS — L732 Hidradenitis suppurativa: Secondary | ICD-10-CM

## 2011-06-29 DIAGNOSIS — G8929 Other chronic pain: Secondary | ICD-10-CM

## 2011-06-29 MED ORDER — CYANOCOBALAMIN 1000 MCG/ML IJ SOLN
1000.0000 ug | Freq: Once | INTRAMUSCULAR | Status: AC
  Administered 2011-06-29: 1000 ug via INTRAMUSCULAR

## 2011-06-29 NOTE — Telephone Encounter (Signed)
Patient's wife called and mentioned that you had talked about the Lidoderm patches at his visit. She wanted to remind you to send them to [facility] [place]. I advised you out of the office and may not get the message  Until Monday and she verbalized understanding.

## 2011-06-29 NOTE — Progress Notes (Signed)
  Subjective:    Patient ID: [name], male    DOB: [birth year], 56 y.o.   MRN: [id number]  HPI CC: worsening hydradenitis.  Hydradenitis - feels worsening R>L inner thighs.  Continued boils.  Last saw derm 3 mo ago.  rec return if not improving,but never did.  Continues clindamycin solution and benzoyl peroxide and continued bactrim ds 1 pill bid.  Some nausea occasionally thinks from antibiotic.  Released by Dr. Joya Salm.  Likely will have chronic back pain from arthritis.  Pt doesn't want to go to pain management.  Doesn't think can afford.  Wants to avoid strong narcotics.  Already on percocets prn.  Wants to transfer narcotics to PCP.  Agrees to sign controlled substance contract.  Wonders about lidocaine patches, used brother's patch and helped.  Would like Rx for this today.  Saw ENT Radene Journey) who said loss of taste was neurological.  To see Dr. Erling Cruz for this 08/2011.    Wt Readings from Last 3 Encounters:  06/29/11 308 lb 12 oz (140.048 kg)  05/11/11 313 lb 8 oz (142.203 kg)  04/04/11 304 lb 6.4 oz (138.075 kg)   Brother dying from lung cancer.  Pt wants to quit smoking.  Planning on using nicoderm patches.  Review of Systems Per HPI    Objective:   Physical Exam  Nursing note and vitals reviewed. Constitutional: He appears well-developed and well-nourished. No distress.  Skin: Skin is warm and dry.       Thickened skin and hyperpigmentation in groin region consistent with hydradenitis R>L but not significant inflammation, right inner thigh several colaesced hyperpigmented nodules. able to express sanguinous fluid from bilateral inner thigh pores No obvious fluctuance/boil, no obvious infected abscess.  Several inflammed nodules throughout.  Psychiatric: He has a normal mood and affect.       Assessment & Plan:

## 2011-06-29 NOTE — Patient Instructions (Addendum)
Go to dermatology to evaluate hydradenitis. Controlled substance agreement form signed today. b12 shot today. Call us when you need narcotic refill. Work on quitting smoking.

## 2011-06-30 ENCOUNTER — Other Ambulatory Visit: Payer: Self-pay | Admitting: *Deleted

## 2011-06-30 MED ORDER — LIDOCAINE 5 % EX PTCH: 1.0000 | MEDICATED_PATCH | CUTANEOUS | Status: AC

## 2011-06-30 MED ORDER — ALPRAZOLAM 0.5 MG PO TABS: ORAL_TABLET | ORAL | Status: DC

## 2011-06-30 NOTE — Telephone Encounter (Signed)
Px written for call in   

## 2011-06-30 NOTE — Assessment & Plan Note (Signed)
rec f/u with derm for this.

## 2011-06-30 NOTE — Telephone Encounter (Signed)
Done

## 2011-06-30 NOTE — Telephone Encounter (Signed)
rx called in

## 2011-06-30 NOTE — Assessment & Plan Note (Signed)
Encouraged to start nicotine patches.  Motivated 2/2 brother's illness.

## 2011-06-30 NOTE — Assessment & Plan Note (Signed)
Shot today.

## 2011-06-30 NOTE — Assessment & Plan Note (Signed)
Chronic issues, s/p 4 + spine surgeries Released from neurosurgery. Signed controlled substance agreement today, discussed if stable narcotic regimen, ok to receive refills from here, however if notes increased need for narcotics, will set up with pain clinic. Sent in trial of lidocaine patches

## 2011-07-03 ENCOUNTER — Other Ambulatory Visit: Payer: Self-pay | Admitting: *Deleted

## 2011-07-03 MED ORDER — ZOLPIDEM TARTRATE 10 MG PO TABS: 10.0000 mg | ORAL_TABLET | Freq: Every evening | ORAL | Status: DC | PRN

## 2011-07-03 NOTE — Telephone Encounter (Signed)
OK to refill

## 2011-07-03 NOTE — Telephone Encounter (Signed)
Rx called in as directed.   

## 2011-07-03 NOTE — Telephone Encounter (Signed)
Patient notified

## 2011-07-03 NOTE — Telephone Encounter (Signed)
plz phone in. 

## 2011-07-07 ENCOUNTER — Ambulatory Visit: Payer: Medicare PPO | Admitting: Physical Medicine & Rehabilitation

## 2011-07-10 ENCOUNTER — Other Ambulatory Visit: Payer: Self-pay | Admitting: *Deleted

## 2011-07-10 MED ORDER — OXYCODONE-ACETAMINOPHEN 7.5-325 MG PO TABS: 1.0000 | ORAL_TABLET | Freq: Four times a day (QID) | ORAL | Status: DC | PRN

## 2011-07-10 NOTE — Telephone Encounter (Signed)
OK to refill? Please notify when ready.

## 2011-07-10 NOTE — Telephone Encounter (Signed)
Patient's wife came to office before I could let her know Rx was ready. She picked up script.

## 2011-07-10 NOTE — Telephone Encounter (Signed)
Printed #75 oxycodone 7.5/325mg

## 2011-07-11 ENCOUNTER — Other Ambulatory Visit: Payer: Self-pay | Admitting: Family Medicine

## 2011-07-11 NOTE — Telephone Encounter (Signed)
Has derm been filling bactrim?  Would prefer them to fill if they have been. Have filled relaven

## 2011-07-11 NOTE — Telephone Encounter (Signed)
OK to refill

## 2011-07-12 NOTE — Telephone Encounter (Signed)
Message left advising patient to contact his dermatologist for refill on bactrim. Advised [facility]

## 2011-07-24 ENCOUNTER — Telehealth: Payer: Self-pay | Admitting: *Deleted

## 2011-07-24 NOTE — Telephone Encounter (Signed)
i'd like him to try cutting percocets in half first.  Give me update in next few weeks.

## 2011-07-24 NOTE — Telephone Encounter (Signed)
Received your message that patient has been taking extra percocets.  Given #75 on 07/10/2011.  ran out in 14 days.  That is 5+/day, however was written to take qid as needed.  Using too much, that is probably what is causing sedation.  Signed controlled substance agreement that early refills would not be provided.  Should not be self-titrating meds either.  I will not provide extra narcotics until March.  We can do trial of hydrocodone 10/316m if pt desires then.

## 2011-07-24 NOTE — Telephone Encounter (Signed)
Patient called and said that the percocet is making him too drowsy. He said all he wants to do is sleep. He was asking if he could change from that to vicodin 10-325 instead. He wants to try it for one month and see how it works for him. If so, [facility] [place].

## 2011-07-25 ENCOUNTER — Other Ambulatory Visit: Payer: Self-pay | Admitting: *Deleted

## 2011-07-25 ENCOUNTER — Ambulatory Visit: Payer: Medicare PPO

## 2011-07-25 MED ORDER — HYDROCODONE-ACETAMINOPHEN 10-500 MG PO TABS: 1.0000 | ORAL_TABLET | Freq: Four times a day (QID) | ORAL | Status: DC | PRN

## 2011-07-25 MED ORDER — NABUMETONE 750 MG PO TABS: 750.0000 mg | ORAL_TABLET | Freq: Two times a day (BID) | ORAL | Status: DC

## 2011-07-25 NOTE — Telephone Encounter (Signed)
Prescription was written for qid PRN which means as needed, not every 6 hours scheduled.  If needing so frequently needs to tell me to adjust meds, not just run out of meds early. Ok to phone in vicodin 10 to pharmacy #120, QID PRN breakthrough pain, fill on 07/28/2011.  Do trial of this for 1 month.  No more early refills. I've sent in stronger relafen 767m  to start taking bid to see if better pain control with higher anti inflammatory dose.

## 2011-07-25 NOTE — Telephone Encounter (Signed)
See subsequent phone note.

## 2011-07-25 NOTE — Telephone Encounter (Signed)
Patient called back and left a message saying even taking his meds QID, it would've only been an 18 day supply, which means he would be due for a refill on 07-27-11. He is requesting the refill and wants the vicodin instead of the percocet. Please advise.

## 2011-07-25 NOTE — Telephone Encounter (Signed)
Patient notified. According to his wife there must've been a miscommunication at his visit. She says he has a hard time getting his point across sometimes. He is wanting a less potent medication, but wants to take it more frequently. They understand about the contract. I advised when something doesn't work, he needs to call prior to increasing something on his own. I told him this will decrease running out of meds early and potential medical problems. I advised no early refills. He asked if it could be refilled at the end of February/next week.

## 2011-07-26 NOTE — Telephone Encounter (Signed)
Spoke with patient. Advised to let you know at his office visits if he requires meds more frequently than what is written. I advised that the vicodin is for PRN use, not scheduled. Advised that the relafen had been sent in for him to take BID and hopefully the combination would help him decrease the narcotic use. I advised he could not have script filled until Feb 22 and that there would be no more early fills, so if he runs out early, he will have to wait until appropriate time for a refill. He verbalized understanding.

## 2011-07-26 NOTE — Telephone Encounter (Signed)
[facility] with instructions to fill on 07-28-11.

## 2011-07-28 ENCOUNTER — Other Ambulatory Visit: Payer: Self-pay

## 2011-07-28 MED ORDER — ALPRAZOLAM 0.5 MG PO TABS: ORAL_TABLET | ORAL | Status: DC

## 2011-07-28 NOTE — Telephone Encounter (Signed)
I dont see a phone note about patients alprazolam. Maudie Mercury do you know anything about this?

## 2011-07-28 NOTE — Telephone Encounter (Signed)
Received fax refill request from patients pharmacy.  Okay to refill?

## 2011-07-28 NOTE — Telephone Encounter (Signed)
Patient said he had recently spoke to [facility] about refills and that there were some changes made; however, when he picked up the refills that it was not what he had discussed and he needs to speak with someone for clarification.  Please call at [phone]

## 2011-07-28 NOTE — Telephone Encounter (Signed)
Ok to phone in.

## 2011-07-31 NOTE — Telephone Encounter (Signed)
Patient did not mention needing xanax when I spoke to him. Rx called in as directed.

## 2011-08-01 ENCOUNTER — Encounter: Payer: Self-pay | Admitting: Family Medicine

## 2011-08-01 ENCOUNTER — Ambulatory Visit: Payer: Medicare PPO

## 2011-08-01 ENCOUNTER — Ambulatory Visit (INDEPENDENT_AMBULATORY_CARE_PROVIDER_SITE_OTHER): Payer: Medicare PPO | Admitting: Family Medicine

## 2011-08-01 VITALS — BP 128/84 | HR 88 | Temp 98.4°F | Wt 303.0 lb

## 2011-08-01 DIAGNOSIS — G8929 Other chronic pain: Secondary | ICD-10-CM

## 2011-08-01 DIAGNOSIS — L732 Hidradenitis suppurativa: Secondary | ICD-10-CM

## 2011-08-01 DIAGNOSIS — G4733 Obstructive sleep apnea (adult) (pediatric): Secondary | ICD-10-CM

## 2011-08-01 DIAGNOSIS — M549 Dorsalgia, unspecified: Secondary | ICD-10-CM

## 2011-08-01 DIAGNOSIS — E538 Deficiency of other specified B group vitamins: Secondary | ICD-10-CM

## 2011-08-01 DIAGNOSIS — F17200 Nicotine dependence, unspecified, uncomplicated: Secondary | ICD-10-CM

## 2011-08-01 MED ORDER — CYANOCOBALAMIN 1000 MCG/ML IJ SOLN
1000.0000 ug | Freq: Once | INTRAMUSCULAR | Status: AC
  Administered 2011-08-01: 1000 ug via INTRAMUSCULAR

## 2011-08-01 NOTE — Assessment & Plan Note (Signed)
precontemplative currently 2/2 increased stress.

## 2011-08-01 NOTE — Assessment & Plan Note (Signed)
Has been told by derm may need plastic surgery.  Pt very hesitant to pursue this. Monitor for now.

## 2011-08-01 NOTE — Assessment & Plan Note (Signed)
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 Reviewed recent narcotic use.   Advised to bring pill bottle with remaining vicodin pills and I will provide correct doseage (written form) for vicodin 10/365m, only #120 per month. Again discussed if needing higher doses, would recommend establishing with pain management.  Pt very resistant to this 2/2 cost. Under pain contract with myself. Insurance did not cover lidocaine patches.

## 2011-08-01 NOTE — Patient Instructions (Addendum)
Bring pill box of vicodin 5/541m with all remaining pills (about #65).  We will then call in the new dose of vicodin 10/3245m Continue nabumetone twice daily. Return to see me in 1 month.

## 2011-08-01 NOTE — Assessment & Plan Note (Signed)
b12 shot today.

## 2011-08-01 NOTE — Assessment & Plan Note (Signed)
rec use of CPAP. Discussed importance of treatment of OSA.

## 2011-08-01 NOTE — Progress Notes (Signed)
  Subjective:    Patient ID: [name], male    DOB: 12-Oct-1955, 56 y.o.   MRN: [id number]  HPI CC: f/u meds  Chronic back pain - s/p mult surgeries.  released by NSG.  I took over pain medications 07/2011.  Provided with #75 percocets 07/11/2011.  Ran out after 15d, early.  Now states actually didn't run out - had extra pills that wife didn't know about.  Still has percocets at home, not currently taking.  Changed percocets to vicodins 10/586m (stated percocets were too sedating.)  Has been taking 2 pills at a time, has ~60 left.  States pharmacy filled 5/500s instead of 10/500s.  We called pharmacy and verified this.  States pharmacist told him to take 2 pills at a time.  Would like to switch to vicodins 10/3263m  States cannot afford pain management.  Wt Readings from Last 3 Encounters:  08/01/11 303 lb (137.44 kg)  06/29/11 308 lb 12 oz (140.048 kg)  05/11/11 313 lb 8 oz (142.203 kg)   Hydradenitis - derm recommended plastic surgery.  Pt does not want to have this done currently.  Could not see derm 2/2 cost.  Spoke to derm over the phone.  Significant stress - brother dying of lung cancer has been told has 1 month left.  Going to gym - has lost 5 lbs since last visit.  Smoking - 1/2 ppd.  precontemplative  OSA - doesn't use CPAP - "i breathe fine when I'm asleep".  Review of Systems Per HPI    Objective:   Physical Exam  Nursing note and vitals reviewed. Constitutional: He appears well-developed and well-nourished. No distress.  Psychiatric: He has a normal mood and affect.       Tears with discussion of brother's illness       Assessment & Plan:

## 2011-08-04 ENCOUNTER — Ambulatory Visit: Payer: Medicare PPO | Admitting: Cardiology

## 2011-08-07 ENCOUNTER — Other Ambulatory Visit: Payer: Self-pay | Admitting: *Deleted

## 2011-08-07 ENCOUNTER — Other Ambulatory Visit: Payer: Self-pay | Admitting: Family Medicine

## 2011-08-07 ENCOUNTER — Ambulatory Visit: Payer: Medicare PPO | Admitting: Physical Medicine & Rehabilitation

## 2011-08-07 MED ORDER — HYDROCODONE-ACETAMINOPHEN 10-325 MG PO TABS: 1.0000 | ORAL_TABLET | Freq: Four times a day (QID) | ORAL | Status: DC | PRN

## 2011-08-07 MED ORDER — HYDROCODONE-ACETAMINOPHEN 10-500 MG PO TABS: 1.0000 | ORAL_TABLET | Freq: Four times a day (QID) | ORAL | Status: DC | PRN

## 2011-08-07 MED ORDER — ZOLPIDEM TARTRATE 10 MG PO TABS: 10.0000 mg | ORAL_TABLET | Freq: Every evening | ORAL | Status: DC | PRN

## 2011-08-07 NOTE — Telephone Encounter (Signed)
Written script provided to patient's wife. Ambien called in as directed. # 49 vicodin 5-500 disposed of in sharps box.

## 2011-08-07 NOTE — Telephone Encounter (Signed)
Filled - printed and provided to [employer].  Please count vicodin 5/500s and dispose of.   plz phone in [place].

## 2011-08-07 NOTE — Telephone Encounter (Signed)
Patient's wife came in to return unused vicodin. # 13 left.  She is waiting for a written script for the other script.

## 2011-08-23 ENCOUNTER — Telehealth: Payer: Self-pay | Admitting: Family Medicine

## 2011-08-23 NOTE — Telephone Encounter (Signed)
Yes I filled out for assistance with uloric and placdd in my out box last week.

## 2011-08-23 NOTE — Telephone Encounter (Signed)
I haven't seen this paperwork. Have you been working on this? She said she dropped it off on 08-18-11 and gave it to [place].

## 2011-08-23 NOTE — Telephone Encounter (Signed)
Pt is calling about forms for Assistance with Meds . Form was faxed back to her from company and it says that some of the form was incomplete. Needs to be faxed to [phone] for Missing Prescription and complete section 6 physician information. Can call [phone] Mon-Fri-8:30-6 pm for any questions and Mr. Balducci's case number is [phone]. Can call Pt's wife as well if there are any questions.

## 2011-08-23 NOTE — Telephone Encounter (Signed)
Forms were faxed and sent for scanning. Patient's wife will bring back the forms for completion and re-fax.

## 2011-08-24 ENCOUNTER — Ambulatory Visit: Payer: Medicare PPO | Admitting: Cardiology

## 2011-08-28 ENCOUNTER — Other Ambulatory Visit: Payer: Self-pay

## 2011-08-28 MED ORDER — ALPRAZOLAM 0.5 MG PO TABS: ORAL_TABLET | ORAL | Status: DC

## 2011-08-28 NOTE — Telephone Encounter (Signed)
i filled out prior auth for lidoderm patches in past, denied.  Will fill again but may be denied again as no changes in interim.

## 2011-08-28 NOTE — Telephone Encounter (Signed)
[facility]  Patient advised that he will here from [place] regarding PA once Dr. Danise Mina returns to the office.

## 2011-08-28 NOTE — Telephone Encounter (Signed)
Received prior authorization request from pharmacy for Lidoderm patch.  Requested paperwork from [facility] for [place]. Form is in your in box to be completed and faxed back.

## 2011-08-28 NOTE — Telephone Encounter (Signed)
pts wife request refill for Alprazolam 0.5 mg. Pt last seen 08/01/11. Pt will be out of med today. Dr Danise Mina is out of the office today and may not be able to have computer access. Please advise. [facility] and pts wife can be reached at [phone].

## 2011-08-28 NOTE — Telephone Encounter (Signed)
I will refil the alprazolam --Px written for call in   The prior auth should go to PCP Dr G when he returns

## 2011-08-29 ENCOUNTER — Ambulatory Visit: Payer: Medicare PPO | Admitting: Family Medicine

## 2011-08-30 ENCOUNTER — Encounter: Payer: Self-pay | Admitting: Family Medicine

## 2011-08-30 DIAGNOSIS — M5030 Other cervical disc degeneration, unspecified cervical region: Secondary | ICD-10-CM | POA: Insufficient documentation

## 2011-08-30 NOTE — Telephone Encounter (Signed)
Filled again and placed in [name]'s out box.

## 2011-08-31 NOTE — Telephone Encounter (Signed)
PA forms faxed. Will await response. Forms given to [facility].

## 2011-09-04 ENCOUNTER — Ambulatory Visit: Payer: Medicare PPO | Admitting: Family Medicine

## 2011-09-04 ENCOUNTER — Other Ambulatory Visit: Payer: Self-pay | Admitting: *Deleted

## 2011-09-04 MED ORDER — ZOLPIDEM TARTRATE 10 MG PO TABS: 10.0000 mg | ORAL_TABLET | Freq: Every evening | ORAL | Status: DC | PRN

## 2011-09-04 MED ORDER — HYDROCODONE-ACETAMINOPHEN 10-325 MG PO TABS: 1.0000 | ORAL_TABLET | Freq: Four times a day (QID) | ORAL | Status: DC | PRN

## 2011-09-04 NOTE — Telephone Encounter (Signed)
OK to refill

## 2011-09-04 NOTE — Telephone Encounter (Signed)
Ok to refill 

## 2011-09-04 NOTE — Telephone Encounter (Signed)
plz phone in. 

## 2011-09-04 NOTE — Telephone Encounter (Signed)
Received faxed refill request from pharmacy for Hydrocodone-Acetaminophen 10-325 mg, take 1 tablet by mouth every 6 hours as needed breakthrough pain. This in no longer on medication sheet. Is it okay to refill medication?

## 2011-09-04 NOTE — Telephone Encounter (Signed)
Rx called in as directed.   

## 2011-09-05 NOTE — Telephone Encounter (Signed)
Rx called in as directed.   

## 2011-09-06 ENCOUNTER — Ambulatory Visit (INDEPENDENT_AMBULATORY_CARE_PROVIDER_SITE_OTHER): Payer: Medicare PPO | Admitting: Family Medicine

## 2011-09-06 ENCOUNTER — Encounter: Payer: Self-pay | Admitting: Family Medicine

## 2011-09-06 VITALS — BP 128/82 | HR 72 | Temp 98.4°F | Wt 306.8 lb

## 2011-09-06 DIAGNOSIS — M47812 Spondylosis without myelopathy or radiculopathy, cervical region: Secondary | ICD-10-CM

## 2011-09-06 DIAGNOSIS — F17200 Nicotine dependence, unspecified, uncomplicated: Secondary | ICD-10-CM

## 2011-09-06 DIAGNOSIS — J449 Chronic obstructive pulmonary disease, unspecified: Secondary | ICD-10-CM

## 2011-09-06 DIAGNOSIS — E538 Deficiency of other specified B group vitamins: Secondary | ICD-10-CM

## 2011-09-06 DIAGNOSIS — G4733 Obstructive sleep apnea (adult) (pediatric): Secondary | ICD-10-CM

## 2011-09-06 MED ORDER — CYANOCOBALAMIN 1000 MCG/ML IJ SOLN
1000.0000 ug | Freq: Once | INTRAMUSCULAR | Status: AC
  Administered 2011-09-06: 1000 ug via INTRAMUSCULAR

## 2011-09-06 MED ORDER — ALBUTEROL SULFATE (2.5 MG/3ML) 0.083% IN NEBU
2.5000 mg | INHALATION_SOLUTION | Freq: Once | RESPIRATORY_TRACT | Status: AC
  Administered 2011-09-06: 2.5 mg via RESPIRATORY_TRACT

## 2011-09-06 NOTE — Assessment & Plan Note (Signed)
Pt states will start using home CPAP.

## 2011-09-06 NOTE — Assessment & Plan Note (Signed)
b12 shot today. Started 09/2010. May consider changing to oral supplementation. Lab Results  Component Value Date   [id number] 917 02/14/2011

## 2011-09-06 NOTE — Patient Instructions (Signed)
Spirometry today.  We will call you with results. Keep working on smoking cessation - best thing to do for your health. Return in 3 months for follow up.

## 2011-09-06 NOTE — Assessment & Plan Note (Signed)
Continue to encourage cessation.

## 2011-09-06 NOTE — Assessment & Plan Note (Addendum)
Presumed COPD from smoking - checked spirometry today. FVC 64%, FEV1 65%, ratio 1.02 Consistent with moderate restriction.  No bronchodilator reversibility. Anticipate obesity and charcot marie tooth disease contributing. Will refer to pulm for formal PFTs and further evaluation, recs.

## 2011-09-06 NOTE — Assessment & Plan Note (Signed)
Currently under evaluation by Dr. Erling Cruz.

## 2011-09-06 NOTE — Progress Notes (Signed)
  Subjective:    Patient ID: [name], male    DOB: 1955/07/01, 56 y.o.   MRN: [id number]  HPI CC: 1 mo f/u  Presents alone today [name] working 3rd shift).  Saw Dr. Erling Cruz - pending sinus xray and MRI.  Concern for C7 compression leading to decreased sense of smell.  Taste sensation improving.  Had fall 3 wks ago, slipped off stool.  For pain taking norco and relafen.  COPD - never had spirometry done.  Breathing doing well currently.  No SOB, coughing currently.  Feels breathing fine currently.  Treated for several COPD exacerbations late 2012.  Smoking - 8-10 cig/day.  Didn't like chantix.  Wants to quit on his own.  Hydradenitis - doesn't want plastic surgery.  Continues bactrim DS and cleocin and benzoyl peroxide.  Obesity - up 3 lbs since last visit.   Wt Readings from Last 3 Encounters:  09/06/11 306 lb 12 oz (139.141 kg)  08/01/11 303 lb (137.44 kg)  06/29/11 308 lb 12 oz (140.048 kg)   OSA - states will try CPAP at home.  Review of Systems Per HPI    Objective:   Physical Exam  Nursing note and vitals reviewed. Constitutional: He appears well-developed and well-nourished. No distress.  HENT:  Head: Normocephalic and atraumatic.  Mouth/Throat: Oropharynx is clear and moist. No oropharyngeal exudate.  Eyes: Conjunctivae and EOM are normal. Pupils are equal, round, and reactive to light. No scleral icterus.  Neck: Normal range of motion. Neck supple.  Cardiovascular: Normal rate, regular rhythm, normal heart sounds and intact distal pulses.   No murmur heard. Pulmonary/Chest: Effort normal and breath sounds normal. No respiratory distress. He has no wheezes. He has no rales.       Coarse breath sounds  Musculoskeletal: He exhibits no edema.  Lymphadenopathy:    He has no cervical adenopathy.  Skin: Skin is warm and dry. No rash noted.  Psychiatric: He has a normal mood and affect.       Assessment & Plan:

## 2011-09-07 ENCOUNTER — Telehealth: Payer: Self-pay

## 2011-09-07 NOTE — Telephone Encounter (Signed)
Prior auth denial letter faxed to [place]. Letter and prior auth form sent to be scanned.

## 2011-09-19 ENCOUNTER — Other Ambulatory Visit: Payer: Self-pay | Admitting: *Deleted

## 2011-09-19 MED ORDER — DIAZEPAM 10 MG PO TABS: 10.0000 mg | ORAL_TABLET | Freq: Two times a day (BID) | ORAL | Status: DC | PRN

## 2011-09-19 NOTE — Telephone Encounter (Signed)
pts wife said pt is out of medication (Diazepam) and has seen Dr Erling Cruz and diagnosed with strain and sprain of neck; pt is to go to [facility] imaging for further testing and needs the Diazepam to help relax his muscles also. [name] said she will ck with [facility] later this evening but request med be sent in today.

## 2011-09-19 NOTE — Telephone Encounter (Signed)
Rx called in as directed. Patient advised that Dr. Joya Salm had prescribed this in the past and it helps as a muscle relaxer. Advised that he cannot be on valium and xanax at the same time. He advised that the valium is for muscle relaxer and xanax is for anxiety. I advised that both could be used for both purposes and that at his next follow up it would need to be addressed as to which one he wanted to be on. He said if he had to choose, he would choose the valium.

## 2011-09-19 NOTE — Telephone Encounter (Signed)
We have not filled this in past.  Pt to check with Dr. Joya Salm to have this filled again.  If unable to, may fill #30 RF 0 but will need to discuss other options as not good to be on 2 benzos.

## 2011-09-27 ENCOUNTER — Ambulatory Visit (INDEPENDENT_AMBULATORY_CARE_PROVIDER_SITE_OTHER): Payer: Medicare PPO | Admitting: Cardiology

## 2011-09-27 ENCOUNTER — Encounter: Payer: Self-pay | Admitting: Cardiology

## 2011-09-27 VITALS — BP 121/83 | HR 70 | Ht 68.0 in | Wt 302.0 lb

## 2011-09-27 DIAGNOSIS — I2510 Atherosclerotic heart disease of native coronary artery without angina pectoris: Secondary | ICD-10-CM

## 2011-09-27 MED ORDER — LOSARTAN POTASSIUM 25 MG PO TABS: 25.0000 mg | ORAL_TABLET | Freq: Every day | ORAL | Status: DC

## 2011-09-27 MED ORDER — PRAVASTATIN SODIUM 20 MG PO TABS: 20.0000 mg | ORAL_TABLET | Freq: Every evening | ORAL | Status: DC

## 2011-09-27 NOTE — Assessment & Plan Note (Signed)
Continue aspirin. Add statin. Will begin Pravachol 20 mg daily. Check lipids and liver in 6 weeks.

## 2011-09-27 NOTE — Assessment & Plan Note (Signed)
Patient counseled on discontinuing. 

## 2011-09-27 NOTE — Patient Instructions (Signed)
Your physician wants you to follow-up in: [place] will receive a reminder letter in the mail two months in advance. If you don't receive a letter, please call our office to schedule the follow-up appointment.   START PRAVASTATIN 20 MG ONCE DAILY AT BEDTIME  START LOSARTAN 25 MG ONCE DAILY  Your physician recommends that you return for lab work in: [place] recommends that you return for lab work in: 6 WEEKS=FASTING=1ST [place]

## 2011-09-27 NOTE — Assessment & Plan Note (Signed)
Mild to moderate reduction in LV function. Continue beta blocker. Add Cozaar 25 mg daily. Check potassium and renal function in one week.

## 2011-09-27 NOTE — Progress Notes (Signed)
HPI:[name] is a pleasant gentleman who I have seen in the past for coronary artery disease.  He has had prior inferior infarct with PCI of his right coronary artery and PDA.  His most recent cardiac catheterization was performed on March 30, 2005.  The patient had an ejection fraction of 40% with inferior akinesis at that time.  However, his stents in the right coronary artery were widely patent.  Note, his most recent echocardiogram on March 30, 2005 showed normal to mildly reduced LV function.  Myoview in May of 2011 showed an inferior infarct but no ischemia. Ejection fraction 44%. I last saw him in May of 2011. Since then, he has some dyspnea on exertion but no orthopnea, PND, pedal edema, chest pain or syncope.  Current Outpatient Prescriptions  Medication Sig Dispense Refill  . albuterol (PROVENTIL) (2.5 MG/3ML) 0.083% nebulizer solution USE 1 VIAL PER NEBULIZER EVERY 6 HRS AS NEEDED FOR WHEEZING  75 mL  12  . ALPRAZolam (XANAX) 0.5 MG tablet Take one tablet twice a day.  60 tablet  0  . aspirin 325 MG tablet Take 325 mg by mouth daily.        . Benzoyl Peroxide 3.5 % LIQD Use OTC wash daily      . Cholecalciferol (VITAMIN D) 2000 UNITS CAPS Take 1 capsule (2,000 Units total) by mouth daily.  30 capsule  11  . clindamycin (CLEOCIN T) 1 % external solution Apply topically 2 (two) times daily.  30 mL  0  . clotrimazole (LOTRIMIN) 1 % cream Apply topically 2 (two) times daily.  30 g  0  . diazepam (VALIUM) 10 MG tablet Take 1 tablet (10 mg total) by mouth every 12 (twelve) hours as needed.  30 tablet  0  . febuxostat (ULORIC) 40 MG tablet Take 40 mg by mouth daily.       . folic acid (FOLVITE) 1 MG tablet Take 1 tablet (1 mg total) by mouth daily.  90 tablet  3  . HYDROcodone-acetaminophen (NORCO) 10-325 MG per tablet Take 1 tablet by mouth every 6 (six) hours as needed (breakthrough pain, fill on 08/07/2011).  120 tablet  0  . metFORMIN (GLUCOPHAGE) 500 MG tablet Take 1 tablet (500 mg  total) by mouth 2 (two) times daily with a meal.      . metoprolol (TOPROL-XL) 50 MG 24 hr tablet TAKE 1/2 TABLET EVERY DAY  30 tablet  1  . nabumetone (RELAFEN) 750 MG tablet Take 1 tablet (750 mg total) by mouth 2 (two) times daily.  60 tablet  6  . sulfamethoxazole-trimethoprim (BACTRIM DS) 800-160 MG per tablet Take 1 tablet by mouth 2 (two) times daily.   20 tablet  0  . zolpidem (AMBIEN) 10 MG tablet Take 1 tablet (10 mg total) by mouth at bedtime as needed for sleep.  30 tablet  0   Current Facility-Administered Medications  Medication Dose Route Frequency Provider Last Rate Last Dose  . cyanocobalamin ((VITAMIN B-12)) injection 1,000 mcg  1,000 mcg Intramuscular Q30 days Ria Bush, MD   1,000 mcg at 09/08/10 1458     Past Medical History  Diagnosis Date  . Anxiety state, unspecified   . CAD (coronary artery disease)     x3 with stents last 2005, EF 40%  . Cardiomyopathy   . 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 COPD (chronic obstructive pulmonary disease)   . T2DM (type 2 diabetes mellitus)   .  Disturbances of sensation of smell and taste   . Hepatic steatosis   . Gout   . Hidradenitis suppurativa 2011    followd by Lyndle Herrlich - daily bactrim, s/p intralesional steroid injection 10/2010  . HLD (hyperlipidemia)   . HTN (hypertension)   . History of MI (myocardial infarction)   . Obesity   . OSA (obstructive sleep apnea)   . Tobacco use disorder     cigar  . B12 deficiency   . Vitamin d deficiency   . Weight loss   . Allergic rhinitis   . Cervical spondylosis 05/2010    s/p surgery  . DDD (degenerative disc disease)   . Lumbar herniated disc   . Spinal stenosis     Pain meds prescribed by neurosurgery, rec establish with pain management    Past Surgical History  Procedure Date  . Tonsillectomy and adenoidectomy 1972  . Lumbar disc surgery     L5-S1  . Cervical discectomy 01/2010    Anterior C4-7, cervical cord decompression and B  foraminotomy ([place])(metal plate)  . Lumbar laminectomy 12/11    L2-5laminectomy/foraminotomy for stenosis ([place])  . Horizon study stenting x4 2000, 2005    in the RCA with kissing balloon angioplasty, of the PDA ostium-5 stents total per patient  . Cardiac catheterization 03/30/05    Left, Left ventriculography, coronary angiography, intravascular [place] of RCA  . Myelogram     L5-S1 and L1-2 spondylosis  . Scrotal US 07/2010    hydradenitis - testes WNL, small R epididymal cyst, small L varicocele, no sinus tract mention  . Colonoscopy 05/06/2007    normal, small int hemorrhoids rpt 5 yrs due to fmhx    History   Social History  . Marital Status: Married    Spouse Name: N/A    Number of Children: 0  . Years of Education: N/A   Occupational History  . Dental technician implants, crown and bridge-now disability 2006    Social History Main Topics  . Smoking status: Current Everyday Smoker -- 0.5 packs/day    Types: Cigarettes, Pipe  . Smokeless tobacco: Not on file  . Alcohol Use: No  . Drug Use: No  . Sexually Active: Not on file   Other Topics Concern  . Not on file   Social History Narrative   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 x 5 yearscaffeine: 2 cups coffee, 2 cups sodaOccupation: dental technician implants, crowne and bridge, now disability 2006Lives with wife, 1 dog, no children    ROS: Problems with chronic back pain but no fevers or chills, productive cough, hemoptysis, dysphasia, odynophagia, melena, hematochezia, dysuria, hematuria, rash, seizure activity, orthopnea, PND, pedal edema, claudication. Remaining systems are negative.  Physical Exam: Well-developed obese in no acute distress.  Skin is warm and dry.  HEENT is normal.  Neck is supple.  Chest is clear to auscultation with normal expansion.  Cardiovascular exam is regular rate and rhythm.  Abdominal exam nontender or distended. No masses palpated. Extremities show no edema. neuro grossly  intact  ECG normal sinus rhythm at a rate of 70. No ST changes.

## 2011-09-27 NOTE — Assessment & Plan Note (Signed)
Add Pravachol as outlined.

## 2011-09-27 NOTE — Assessment & Plan Note (Signed)
Continue present medications. Add Cozaar for her reduced LV function.

## 2011-09-28 ENCOUNTER — Encounter: Payer: Self-pay | Admitting: Family Medicine

## 2011-09-28 ENCOUNTER — Other Ambulatory Visit: Payer: Self-pay | Admitting: *Deleted

## 2011-09-28 MED ORDER — ALPRAZOLAM 0.5 MG PO TABS: ORAL_TABLET | ORAL | Status: DC

## 2011-09-28 NOTE — Telephone Encounter (Signed)
Medication phoned to pharmacy.  

## 2011-09-28 NOTE — Telephone Encounter (Signed)
plz phone in. 

## 2011-10-02 ENCOUNTER — Other Ambulatory Visit: Payer: Self-pay | Admitting: *Deleted

## 2011-10-02 MED ORDER — HYDROCODONE-ACETAMINOPHEN 10-325 MG PO TABS: 1.0000 | ORAL_TABLET | Freq: Four times a day (QID) | ORAL | Status: DC | PRN

## 2011-10-02 NOTE — Telephone Encounter (Signed)
Medication phoned to pharmacy.  

## 2011-10-02 NOTE — Telephone Encounter (Signed)
Ok to refill 

## 2011-10-02 NOTE — Telephone Encounter (Signed)
plz phone in. 

## 2011-10-04 ENCOUNTER — Other Ambulatory Visit: Payer: Medicare PPO

## 2011-10-04 DIAGNOSIS — J449 Chronic obstructive pulmonary disease, unspecified: Secondary | ICD-10-CM

## 2011-10-04 HISTORY — DX: Chronic obstructive pulmonary disease, unspecified: J44.9

## 2011-10-06 ENCOUNTER — Encounter: Payer: Self-pay | Admitting: Pulmonary Disease

## 2011-10-06 ENCOUNTER — Other Ambulatory Visit: Payer: Medicare PPO

## 2011-10-06 ENCOUNTER — Other Ambulatory Visit: Payer: Self-pay | Admitting: *Deleted

## 2011-10-06 ENCOUNTER — Ambulatory Visit (INDEPENDENT_AMBULATORY_CARE_PROVIDER_SITE_OTHER): Payer: Medicare PPO | Admitting: Pulmonary Disease

## 2011-10-06 ENCOUNTER — Ambulatory Visit (INDEPENDENT_AMBULATORY_CARE_PROVIDER_SITE_OTHER)
Admission: RE | Admit: 2011-10-06 | Discharge: 2011-10-06 | Disposition: A | Discharge status: Home / Self Care | Payer: Medicare PPO | Source: Ambulatory Visit | Attending: Pulmonary Disease | Admitting: Pulmonary Disease

## 2011-10-06 VITALS — BP 110/70 | HR 66 | Temp 98.1°F | Ht 67.0 in | Wt 309.8 lb

## 2011-10-06 DIAGNOSIS — R0600 Dyspnea, unspecified: Secondary | ICD-10-CM

## 2011-10-06 DIAGNOSIS — R0989 Other specified symptoms and signs involving the circulatory and respiratory systems: Secondary | ICD-10-CM

## 2011-10-06 DIAGNOSIS — R0609 Other forms of dyspnea: Secondary | ICD-10-CM

## 2011-10-06 DIAGNOSIS — G4733 Obstructive sleep apnea (adult) (pediatric): Secondary | ICD-10-CM

## 2011-10-06 MED ORDER — ZOLPIDEM TARTRATE 10 MG PO TABS: 10.0000 mg | ORAL_TABLET | Freq: Every evening | ORAL | Status: DC | PRN

## 2011-10-06 NOTE — Assessment & Plan Note (Signed)
The patient has been told that he had sleep apnea, and currently has a CPAP machine that he does not use.  However, he tells me that he has never had a sleep study, and was started on CPAP after what sounds like overnight oximetry.  He does have a history of loud snoring, as well as an abnormal breathing pattern during sleep according to the wife.  He also has nonrestorative sleep and sleepiness during the day, but takes a lot of sedating medications.  He obviously is going to need a formal sleep study.

## 2011-10-06 NOTE — Telephone Encounter (Signed)
plz phoen in.

## 2011-10-06 NOTE — Telephone Encounter (Signed)
OK to refill

## 2011-10-06 NOTE — Assessment & Plan Note (Signed)
The patient has dyspnea primarily with moderate to heavy or exertional activities.  He does not feel that he has an issue with his normal everyday life.  He has a long history of smoking, however his recent spirometry shows a decreased FEV1 but a normal ratio.  It is unclear if this is because of air trapping, or simply restrictive disease related to his obesity and possibly his neurologic disease.  I would like to schedule him for full PFTs with lung volumes to help clear up this issue.  The patient also has significant underlying cardiac disease and morbid obesity, both of which can contribute to his dyspnea.

## 2011-10-06 NOTE — Progress Notes (Signed)
  Subjective:    Patient ID: [name], male    DOB: Jun 13, 1955, 56 y.o.   MRN: [id number]  HPI The patient is a 56 year old male who I've been asked to see for possible COPD.  He also has a history of sleep apnea for which he is on CPAP.  The patient has a long history of tobacco use, and continues to smoke one pack of cigarettes per day.  He does not feel that he has a breathing problem unless he does heavy her exertional activities.  He describes a 2 block dyspnea on exertion at a moderate pace on flat ground, and will get winded walking up one flight of stairs.  He has a classic morning smokers cough, but does not bring up purulence.  He notes mild lower extremity edema, but does not feel this is a significant problem.  He also has a known underlying cardiomyopathy.  He has had a recent spirometry, which shows a decrease in FEV1, but his ratio is totally normal.  His flow volume loop looks more restricted than anything else.  He has not had lung volumes or diffusion capacity.  His last chest x-ray was in 2012.  The patient has also been told that he has sleep apnea, however he has never had formal sleep testing.  He does describe an overnight oximetry, which was followed by CPAP being prescribed.  He currently does not wear the device.  His wife notes loud snoring and also an abnormal breathing pattern during sleep.  He has nonrestorative sleep, and significant daytime sleepiness.  However, he does take a lot of chronic pain medications.   Review of Systems  Constitutional: Negative for fever and unexpected weight change.  HENT: Positive for trouble swallowing. Negative for ear pain, nosebleeds, congestion, sore throat, rhinorrhea, sneezing, dental problem, postnasal drip and sinus pressure.   Eyes: Negative for redness and itching.  Respiratory: Positive for cough. Negative for chest tightness, shortness of breath and wheezing.   Cardiovascular: Negative for palpitations and leg swelling.    Gastrointestinal: Negative for nausea and vomiting.  Genitourinary: Negative for dysuria.  Musculoskeletal: Positive for joint swelling.  Skin: Positive for rash.  Neurological: Positive for headaches.  Hematological: Does not bruise/bleed easily.  Psychiatric/Behavioral: Negative for dysphoric mood. The patient is not nervous/anxious.        Objective:   Physical Exam Constitutional:  Morbidly obese male, no acute distress  HENT:  Nares patent without discharge, but deviated septum to right with narrowing.   Oropharynx without exudate, palate and uvula are thick and elongated.   Eyes:  Perrla, eomi, no scleral icterus  Neck:  No JVD, no TMG  Cardiovascular:  Normal rate, regular rhythm, no rubs or gallops.  No murmurs        Intact distal pulses  Pulmonary :  Normal breath sounds, no stridor or respiratory distress   No rales, rhonchi, or wheezing  Abdominal:  Soft, nondistended, bowel sounds present.  No tenderness noted.   Musculoskeletal:  1+ lower extremity edema noted.  Lymph Nodes:  No cervical lymphadenopathy noted  Skin:  No cyanosis noted  Neurologic:  Alert, appropriate, moves all 4 extremities without obvious deficit.         Assessment & Plan:

## 2011-10-06 NOTE — Patient Instructions (Signed)
Will set you up for full breathing studies, and will see you back the same day for review. Will check cxr today, and call you with results. Will set up for a sleep study, and will call you with results.  Work on weight loss, and most importantly, stop smoking.

## 2011-10-09 ENCOUNTER — Ambulatory Visit: Payer: Medicare PPO | Admitting: Family Medicine

## 2011-10-09 NOTE — Telephone Encounter (Signed)
Rx called in as directed.   

## 2011-10-12 ENCOUNTER — Ambulatory Visit (INDEPENDENT_AMBULATORY_CARE_PROVIDER_SITE_OTHER): Payer: Medicare PPO | Admitting: Pulmonary Disease

## 2011-10-12 DIAGNOSIS — R0609 Other forms of dyspnea: Secondary | ICD-10-CM

## 2011-10-12 DIAGNOSIS — R0989 Other specified symptoms and signs involving the circulatory and respiratory systems: Secondary | ICD-10-CM

## 2011-10-12 DIAGNOSIS — R0600 Dyspnea, unspecified: Secondary | ICD-10-CM

## 2011-10-12 LAB — PULMONARY FUNCTION TEST

## 2011-10-12 NOTE — Progress Notes (Signed)
PFT done today. 

## 2011-10-13 ENCOUNTER — Telehealth: Payer: Self-pay | Admitting: Cardiology

## 2011-10-13 ENCOUNTER — Telehealth: Payer: Self-pay | Admitting: Pulmonary Disease

## 2011-10-13 NOTE — Telephone Encounter (Signed)
Spoke with pt, he describes symptoms of upset stomach and a productive cough of brown to green sputum. This has been present since he started both meds. In the past he took a statin and then got so SOB he ended up taking breathing treatments. He will stop the pravastatin and will see if his symptoms improve. He will call back after stopping pravachol if his symptoms do not improve.

## 2011-10-13 NOTE — Telephone Encounter (Signed)
LMOMTCB x 1 

## 2011-10-13 NOTE — Telephone Encounter (Signed)
Please return to patient wife frances [phone] regarding possible med reaction. Patient taking Pravastatin 25 mg & Losartan 20 mg.  C/o coughing and stomach pain/nausea.

## 2011-10-13 NOTE — Telephone Encounter (Signed)
Please let pt know that his breathing tests show minimal copd.  Good news.  Needs to stop smoking.  Will discuss further once he has sleep study and we get him back to discuss.

## 2011-10-16 NOTE — Telephone Encounter (Signed)
Pt aware. [name], CMA  

## 2011-10-16 NOTE — Telephone Encounter (Signed)
Pt's wife returned our call [place]

## 2011-10-19 ENCOUNTER — Encounter: Payer: Self-pay | Admitting: Family Medicine

## 2011-10-19 ENCOUNTER — Ambulatory Visit (INDEPENDENT_AMBULATORY_CARE_PROVIDER_SITE_OTHER): Payer: Medicare PPO | Admitting: Family Medicine

## 2011-10-19 VITALS — BP 126/74 | HR 80 | Temp 98.0°F | Wt 303.0 lb

## 2011-10-19 DIAGNOSIS — R0683 Snoring: Secondary | ICD-10-CM

## 2011-10-19 DIAGNOSIS — E785 Hyperlipidemia, unspecified: Secondary | ICD-10-CM

## 2011-10-19 DIAGNOSIS — R0609 Other forms of dyspnea: Secondary | ICD-10-CM

## 2011-10-19 DIAGNOSIS — M549 Dorsalgia, unspecified: Secondary | ICD-10-CM

## 2011-10-19 DIAGNOSIS — F17200 Nicotine dependence, unspecified, uncomplicated: Secondary | ICD-10-CM

## 2011-10-19 DIAGNOSIS — I2510 Atherosclerotic heart disease of native coronary artery without angina pectoris: Secondary | ICD-10-CM

## 2011-10-19 DIAGNOSIS — E538 Deficiency of other specified B group vitamins: Secondary | ICD-10-CM

## 2011-10-19 DIAGNOSIS — E119 Type 2 diabetes mellitus without complications: Secondary | ICD-10-CM

## 2011-10-19 DIAGNOSIS — J984 Other disorders of lung: Secondary | ICD-10-CM

## 2011-10-19 DIAGNOSIS — R0989 Other specified symptoms and signs involving the circulatory and respiratory systems: Secondary | ICD-10-CM

## 2011-10-19 DIAGNOSIS — G8929 Other chronic pain: Secondary | ICD-10-CM

## 2011-10-19 DIAGNOSIS — I10 Essential (primary) hypertension: Secondary | ICD-10-CM

## 2011-10-19 MED ORDER — DIAZEPAM 10 MG PO TABS: 10.0000 mg | ORAL_TABLET | Freq: Two times a day (BID) | ORAL | Status: DC | PRN

## 2011-10-19 NOTE — Progress Notes (Signed)
  Subjective:    Patient ID: [name], male    DOB: November 13, 1955, 56 y.o.   MRN: [id number]  HPI CC: clarify meds  Since I've last seen [name], he has seen cardiology and pulmonology.  Cards started him on pravastatin but unable to tolerate so stopped.  pulm saw cards and evaluated with PFTs which showed minimal COPD (I cannot find record of PFTs in epic).  CXR showed cardiomegaly with low lung volumes.  Recommended stop smoking.  continues to smoke 1 ppd.  To pick up nicotine patches 11/06/2011.  For OSA, sleep study scheduled 12/01/2011.  Continued chronic pain.  Using brother's lidoderm patch as insurance did not approve.  Concern for ruptured cervical disc by Dr. Erling Cruz, pending f/u appt.  Has been on xanax and valium longterm.  Discussed both were benzos.  Prior h/o anxiety but resolved.  Will remove from list.  Sounds like prior scripts were for situational anxiety years ago.  Agrees to come off xanax.  Requests refill for valium.  Brother not doing well, but has survived longer than expected (lung cancer), concerned may need "nerve pill" when brother dies.  Vit B12 deficiency - prior low normal, then replaced for several months with B12 shot.  Last check normal range.  Will change to oral Lab Results  Component Value Date   SELTRVUY23 343 02/14/2011    Past Medical History  Diagnosis Date  . CAD (coronary artery disease)     x3 with stents last 2005, EF 40%  . Cardiomyopathy   . 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 COPD (chronic obstructive pulmonary disease)   . T2DM (type 2 diabetes mellitus)   . Disturbances of sensation of smell and taste   . Hepatic steatosis   . Gout   . Hidradenitis suppurativa 2011    followd by Lyndle Herrlich - daily bactrim, s/p intralesional steroid injection 10/2010  . HLD (hyperlipidemia)   . HTN (hypertension)   . History of MI (myocardial infarction)   . Obesity   . OSA (obstructive sleep apnea)   . Tobacco use disorder      cigar  . B12 deficiency   . Vitamin d deficiency   . Weight loss   . Allergic rhinitis   . Cervical spondylosis 05/2010    s/p surgery  . DDD (degenerative disc disease)   . Lumbar herniated disc   . Spinal stenosis     released from [place].  Pain meds prescribed by Danise Mina     Review of Systems Per HPI    Objective:   Physical Exam  Nursing note and vitals reviewed. Constitutional: He appears well-developed and well-nourished. No distress.  Psychiatric: He has a normal mood and affect.       Assessment & Plan:

## 2011-10-19 NOTE — Assessment & Plan Note (Addendum)
Stable on current dose of vicodin 10s. Using brother's lidoderm patch. Pain contract with myself. Will continue valium as well - discussed risks of this medicine including but not limited to decreased respiratory drive, pt desires to continue as stable longterm on this med.  Stopped xanax.

## 2011-10-19 NOTE — Assessment & Plan Note (Signed)
Pending formal sleep study to eval for OSA.

## 2011-10-19 NOTE — Assessment & Plan Note (Signed)
Minimal COPD by pulm. I do not find copy of PFT report in epic. Continue to encourage smoking cessation.

## 2011-10-19 NOTE — Assessment & Plan Note (Signed)
rec return in 3 mo for diabetes check.

## 2011-10-19 NOTE — Patient Instructions (Addendum)
I've refilled your valium  Stop xanax. Stop B12 shots.  Start taking 500 mcg of B12 daily. Return at previously scheduled appoitnment to check diabetes. Return in 2-3 months for diabetes checkup. Call us with questions

## 2011-10-19 NOTE — Assessment & Plan Note (Signed)
Seems resolved, will change to oral supplementation.

## 2011-10-19 NOTE — Assessment & Plan Note (Signed)
Pt endorses motivation to quit smoking.  Will pick up nicotine patches 11/06/2011.

## 2011-10-19 NOTE — Assessment & Plan Note (Signed)
Great control on current regimen - continue.

## 2011-10-19 NOTE — Assessment & Plan Note (Signed)
Off pravastatin currently , consider red yeast rice down road. Lab Results  Component Value Date   CHOL 167 02/14/2011   HDL 27.60* 02/14/2011   LDLCALC 68 02/02/2010   LDLDIRECT 106.5 02/14/2011   TRIG 302.0* 02/14/2011   CHOLHDL 6 02/14/2011

## 2011-10-19 NOTE — Assessment & Plan Note (Signed)
On ASA, did not tolerate statin.  Consider red yeast rice.

## 2011-10-20 ENCOUNTER — Encounter: Payer: Self-pay | Admitting: Pulmonary Disease

## 2011-10-31 ENCOUNTER — Ambulatory Visit (HOSPITAL_BASED_OUTPATIENT_CLINIC_OR_DEPARTMENT_OTHER): Payer: Medicare PPO | Attending: Pulmonary Disease | Admitting: General Practice

## 2011-10-31 ENCOUNTER — Other Ambulatory Visit: Payer: Self-pay | Admitting: *Deleted

## 2011-10-31 VITALS — Ht 67.0 in | Wt 303.0 lb

## 2011-10-31 DIAGNOSIS — G4733 Obstructive sleep apnea (adult) (pediatric): Secondary | ICD-10-CM

## 2011-10-31 DIAGNOSIS — I4949 Other premature depolarization: Secondary | ICD-10-CM | POA: Insufficient documentation

## 2011-10-31 MED ORDER — HYDROCODONE-ACETAMINOPHEN 10-325 MG PO TABS: 1.0000 | ORAL_TABLET | Freq: Four times a day (QID) | ORAL | Status: DC | PRN

## 2011-10-31 NOTE — Telephone Encounter (Signed)
plz phone in, fill date on 11/04/2011.

## 2011-10-31 NOTE — Telephone Encounter (Signed)
pts wife walks in;Hydrocodone- APAP called in today to [facility] with note cannot fill until 11/04/11. Pts wife said pt going out of town; will be out of med before 11/04/11. Dr. Danise Mina said can fill today but from now on will be on refill schedule for 1st of each month. Pt not taking med for breakthrough pain taking q6h. Pt's wife scheduling  Pt an appt with Dr Danise Mina. [facility] notified by phone OK to fill today.

## 2011-10-31 NOTE — Telephone Encounter (Signed)
Rx called in as directed with fill date of 11-04-11.

## 2011-10-31 NOTE — Telephone Encounter (Signed)
Received faxed refill request from pharmacy. Is it okay to refill medication?

## 2011-11-01 ENCOUNTER — Other Ambulatory Visit: Payer: Medicare PPO

## 2011-11-02 ENCOUNTER — Telehealth: Payer: Self-pay | Admitting: Cardiology

## 2011-11-02 NOTE — Telephone Encounter (Signed)
Spoke with pt, he was not able to tolerate the pravachol. Labs canceled and pt told he did not need those. Number to the research department at the hosp given to the pt for questions he had regarding the study.

## 2011-11-02 NOTE — Telephone Encounter (Signed)
Pt was to do the statin study but pt can't take statins , labs were scheduled he missed appt on 5-29 and also has one 6-4 does he need this and can he still do study since he can't take statins, still taking the losartin,  pls call [phone]

## 2011-11-03 ENCOUNTER — Telehealth: Payer: Self-pay | Admitting: *Deleted

## 2011-11-03 NOTE — Telephone Encounter (Signed)
I spoke to patient about the [employer]el[employer] Study.Patient will come in for a screening visit next week with research. I spoke to patient about screening process for study. We have to do lab work to make sure patient qualifies for study per protocol.Patient verbalized understanding.

## 2011-11-07 ENCOUNTER — Ambulatory Visit: Payer: Medicare PPO | Admitting: Family Medicine

## 2011-11-07 ENCOUNTER — Other Ambulatory Visit: Payer: Medicare PPO

## 2011-11-07 ENCOUNTER — Other Ambulatory Visit: Payer: Self-pay | Admitting: *Deleted

## 2011-11-07 MED ORDER — ZOLPIDEM TARTRATE 10 MG PO TABS: 10.0000 mg | ORAL_TABLET | Freq: Every evening | ORAL | Status: DC | PRN

## 2011-11-07 NOTE — Telephone Encounter (Signed)
Ok to refill 

## 2011-11-07 NOTE — Telephone Encounter (Signed)
plz phone in. 

## 2011-11-07 NOTE — Telephone Encounter (Signed)
Rx called in as directed.   

## 2011-11-08 ENCOUNTER — Ambulatory Visit (INDEPENDENT_AMBULATORY_CARE_PROVIDER_SITE_OTHER): Payer: Medicare PPO | Admitting: Family Medicine

## 2011-11-08 ENCOUNTER — Encounter: Payer: Self-pay | Admitting: Family Medicine

## 2011-11-08 VITALS — BP 128/80 | HR 80 | Temp 98.3°F | Wt 310.5 lb

## 2011-11-08 DIAGNOSIS — M549 Dorsalgia, unspecified: Secondary | ICD-10-CM

## 2011-11-08 DIAGNOSIS — G8929 Other chronic pain: Secondary | ICD-10-CM

## 2011-11-08 MED ORDER — NORTRIPTYLINE HCL 25 MG PO CAPS: 25.0000 mg | ORAL_CAPSULE | Freq: Every day | ORAL | Status: DC

## 2011-11-08 NOTE — Progress Notes (Signed)
  Subjective:    Patient ID: [name], male    DOB: 06-19-1955, 56 y.o.   MRN: [id number]  HPI CC: discuss pain regimen  Chronic back pain and other pain issues - 4 + spine surgeries, charcot marie tooth disease (motor sensory neuropathy).  Released from [place] and I took over pain management 09/2011.  States unable to afford pain management clinic.  States stays in constant pain.  vicodins only take edge off pain.  Takes relafen (nabumetone) twice daily. Takes vicodin 10/325 four times a day scheduled although I asked him to use PRN breakthrough pain.    Percocets too strong for him (even low dose). Has tried lyrica but caused joint pains.  Has tried gabapentin in past, didn't help. Takes brother's lidoderm patch.  Past Medical History  Diagnosis Date  . CAD (coronary artery disease)     x3 with stents last 2005, EF 40%  . Cardiomyopathy   . 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 COPD (chronic obstructive pulmonary disease) 10/2011    minimal by PFTs  . T2DM (type 2 diabetes mellitus)   . Disturbances of sensation of smell and taste   . Hepatic steatosis   . Gout   . Hidradenitis suppurativa 2011    followd by Lyndle Herrlich - daily bactrim, s/p intralesional steroid injection 10/2010  . HLD (hyperlipidemia)   . HTN (hypertension)   . History of MI (myocardial infarction)   . Obesity   . OSA (obstructive sleep apnea)   . Tobacco use disorder     cigar  . History of non anemic vitamin B12 deficiency     s/p B12 shots, now on oral supplementation (10/2011)  . Vitamin d deficiency   . Weight loss   . Allergic rhinitis   . Cervical spondylosis 05/2010    s/p surgery  . DDD (degenerative disc disease)   . Lumbar herniated disc   . Spinal stenosis     released from [place].  Pain meds prescribed by Danise Mina    Review of Systems Per HPI    Objective:   Physical Exam  Nursing note and vitals reviewed. Constitutional: He appears well-developed and  well-nourished. No distress.  Psychiatric: He has a normal mood and affect.       Assessment & Plan:

## 2011-11-08 NOTE — Patient Instructions (Signed)
Start nortriptyline 52m nightly to help with pain. Continue relafen. Continue hydrocodone /acetaminophen four times a day but try to do decrease use. Return in 1 month for follow up on pain.  If this doesn't help, then we may try long acting pain medicine

## 2011-11-09 ENCOUNTER — Other Ambulatory Visit (INDEPENDENT_AMBULATORY_CARE_PROVIDER_SITE_OTHER): Payer: Medicare PPO

## 2011-11-09 DIAGNOSIS — I2510 Atherosclerotic heart disease of native coronary artery without angina pectoris: Secondary | ICD-10-CM

## 2011-11-09 LAB — BASIC METABOLIC PANEL
CO2: 29 mEq/L (ref 19–32)
Calcium: 8.6 mg/dL (ref 8.4–10.5)
Chloride: 103 mEq/L (ref 96–112)
Creatinine, Ser: 0.8 mg/dL (ref 0.4–1.5)
Glucose, Bld: 98 mg/dL (ref 70–99)

## 2011-11-09 NOTE — Assessment & Plan Note (Signed)
Again discussed i'm not expert in pain management. Has not been on cymbalta, or TCA for pain control component - will do trial of this - chose nortriptyline for more affordable cost than cymbalta and better cardiovascular risk profile than amitriptyline, although did discuss CV risk with TCAs. Trial of this for 1 mo.  If not improved pain control, will change over to long acting narcotic to decrease use of PRN meds - consider opana vs ms contin.  Pt states has trouble with keeping patches on skin (fentanyl).

## 2011-11-16 DIAGNOSIS — G4733 Obstructive sleep apnea (adult) (pediatric): Secondary | ICD-10-CM

## 2011-11-16 DIAGNOSIS — I4949 Other premature depolarization: Secondary | ICD-10-CM

## 2011-11-16 NOTE — Procedures (Signed)
[name], [name]            ACCOUNT NO.:  [id number]  MEDICAL RECORD NO.:  [id number]          PATIENT TYPE:  OUT  LOCATION:  SLEEP CENTER                 FACILITY:  [facility]  PHYSICIAN:  Kathee Delton, MD,FCCPDATE OF BIRTH:  1955[birth year]02-06  DATE OF STUDY:  10/31/2011                           NOCTURNAL POLYSOMNOGRAM  REFERRING PHYSICIAN:  Kathee Delton, MD,FCCP  INDICATION FOR STUDY:  Hypersomnia with sleep apnea.  EPWORTH SLEEPINESS SCORE:  6.  MEDICATIONS:  SLEEP ARCHITECTURE:  The patient had a total sleep time of 177 minutes with no slow-wave sleep and only 25 minutes of REM.  Sleep onset latency was essentially normal at 32 minutes, and REM onset was prolonged at 185 minutes.  Sleep efficiency was very poor at 49%.  RESPIRATORY DATA:  The patient was found to have 10 apneas and 29 obstructive hypopneas, giving him an apnea-hypopnea index of 13 events per hour.  The events occurred in all body positions, and there was moderate snoring noted throughout.  OXYGEN DATA:  There was O2 desaturation as low as 81% with the patient's obstructive events.  CARDIAC DATA:  Rare PVC noted, but no clinically significant arrhythmias were seen.  MOVEMENT-PARASOMNIA:  The patient had no significant leg jerks or other abnormal behaviors noted.  IMPRESSIONS-RECOMMENDATIONS: 1. Mild obstructive sleep apnea/hypopnea syndrome with an AHI of 13     events per hour, and oxygen desaturation as low as 81%.  Treatment     for this degree of sleep apnea can include a trial of weight loss     alone, upper airway surgery, dental appliance, and also continuous     positive airway pressure.  Clinical correlation is suggested.     Should also be noted the patient's degree of sleep apnea may be     underestimated given his short total sleep time     and short REM period. 2. Rare premature ventricular contraction noted, but no clinically     significant arrhythmias were noted.     Kathee Delton, MD,FCCP Diplomate, [place] Board of Sleep Medicine    [facility]/MEDQ  D:  11/16/2011 14:17:56  T:  11/16/2011 21:15:22  Job:  [id number]

## 2011-11-17 ENCOUNTER — Other Ambulatory Visit: Payer: Self-pay | Admitting: *Deleted

## 2011-11-17 NOTE — Telephone Encounter (Signed)
OK to refill

## 2011-11-19 MED ORDER — DIAZEPAM 10 MG PO TABS: 10.0000 mg | ORAL_TABLET | Freq: Two times a day (BID) | ORAL | Status: DC | PRN

## 2011-11-19 NOTE — Telephone Encounter (Signed)
Please call in

## 2011-11-20 ENCOUNTER — Ambulatory Visit: Payer: Medicare PPO | Admitting: Physical Medicine & Rehabilitation

## 2011-11-20 ENCOUNTER — Telehealth: Payer: Self-pay | Admitting: Pulmonary Disease

## 2011-11-20 NOTE — Telephone Encounter (Signed)
Pt needs appt to discuss sleep results with [place]. LMOMTCB x 1.

## 2011-11-20 NOTE — Telephone Encounter (Signed)
Rx called in as directed.   

## 2011-11-21 ENCOUNTER — Other Ambulatory Visit: Payer: Self-pay | Admitting: *Deleted

## 2011-11-21 DIAGNOSIS — R634 Abnormal weight loss: Secondary | ICD-10-CM

## 2011-11-21 DIAGNOSIS — G600 Hereditary motor and sensory neuropathy: Secondary | ICD-10-CM

## 2011-11-21 DIAGNOSIS — F17200 Nicotine dependence, unspecified, uncomplicated: Secondary | ICD-10-CM

## 2011-11-21 MED ORDER — FOLIC ACID 1 MG PO TABS: 1.0000 mg | ORAL_TABLET | Freq: Every day | ORAL | Status: DC

## 2011-11-21 MED ORDER — METFORMIN HCL 500 MG PO TABS: 500.0000 mg | ORAL_TABLET | Freq: Two times a day (BID) | ORAL | Status: DC

## 2011-11-21 NOTE — Telephone Encounter (Signed)
Pt has 7/5 appt.Elnita Maxwell

## 2011-11-21 NOTE — Telephone Encounter (Signed)
Noted  

## 2011-11-22 ENCOUNTER — Other Ambulatory Visit: Payer: Self-pay | Admitting: Neurology

## 2011-11-22 ENCOUNTER — Ambulatory Visit
Admission: RE | Admit: 2011-11-22 | Discharge: 2011-11-22 | Disposition: A | Discharge status: Home / Self Care | Payer: Medicare PPO | Source: Ambulatory Visit | Attending: Neurology | Admitting: Neurology

## 2011-11-22 DIAGNOSIS — R439 Unspecified disturbances of smell and taste: Secondary | ICD-10-CM

## 2011-11-23 ENCOUNTER — Other Ambulatory Visit: Payer: Self-pay | Admitting: *Deleted

## 2011-11-23 ENCOUNTER — Telehealth: Payer: Self-pay | Admitting: *Deleted

## 2011-11-23 MED ORDER — FOLIC ACID 1 MG PO TABS: 1.0000 mg | ORAL_TABLET | Freq: Every day | ORAL | Status: DC

## 2011-11-23 NOTE — Telephone Encounter (Signed)
Message left advising patient.instructed  to call with any questions.

## 2011-11-23 NOTE — Telephone Encounter (Signed)
[name] with [facility] did not get refill on Folic acid 1 mg. Gave refill as instructed over phone.

## 2011-11-23 NOTE — Telephone Encounter (Signed)
Ok to start if Dr. Erling Cruz decides to start - shouldn't significantly interact with any of his other meds.

## 2011-11-23 NOTE — Telephone Encounter (Signed)
[name] called to let you know that he is seeing [name] on the 25th to discuss starting cymbalta. [name] feels the pain is coming from his muscle disease and thinks it may help. She wasn't sure if you wanted to see him in conjunction with [name] to discuss it or if you were good with him just starting the cymbalta on [name] recommendation.

## 2011-11-28 ENCOUNTER — Other Ambulatory Visit: Payer: Self-pay | Admitting: *Deleted

## 2011-11-28 MED ORDER — HYDROCODONE-ACETAMINOPHEN 10-325 MG PO TABS: 1.0000 | ORAL_TABLET | Freq: Four times a day (QID) | ORAL | Status: DC | PRN

## 2011-11-28 NOTE — Telephone Encounter (Signed)
plz phone in. 

## 2011-11-28 NOTE — Telephone Encounter (Signed)
Rx was called in as directed.

## 2011-11-29 ENCOUNTER — Telehealth: Payer: Self-pay | Admitting: Cardiology

## 2011-11-29 NOTE — Telephone Encounter (Signed)
Ok to stay off meds [name]

## 2011-11-29 NOTE — Telephone Encounter (Signed)
New msg Pt's wife called  She said he saw Dr Erling Cruz yesterday. He doesn't want him to continue to take losartan. Please call

## 2011-11-29 NOTE — Telephone Encounter (Signed)
Spoke with pt, pt reports dr love wanted him to stop the pravastatin because his muscle disease is not getting better and he has developed nerve damage. The pt also report he was unable to void while taking the losartan. He has stopped the med and restarted it and the same thing happens. He reports his bp is 120's/80's at home. Okay given for pt to stay off both meds. Will call him back if dr Stanford Breed wants him to do anything else. Will forward for dr Stanford Breed review

## 2011-12-05 ENCOUNTER — Other Ambulatory Visit: Payer: Self-pay | Admitting: Family Medicine

## 2011-12-08 ENCOUNTER — Encounter: Payer: Self-pay | Admitting: Pulmonary Disease

## 2011-12-08 ENCOUNTER — Other Ambulatory Visit: Payer: Self-pay | Admitting: *Deleted

## 2011-12-08 ENCOUNTER — Ambulatory Visit (INDEPENDENT_AMBULATORY_CARE_PROVIDER_SITE_OTHER): Payer: Medicare PPO | Admitting: Pulmonary Disease

## 2011-12-08 VITALS — BP 102/62 | HR 75 | Temp 98.2°F | Ht 67.0 in | Wt 303.0 lb

## 2011-12-08 DIAGNOSIS — G4733 Obstructive sleep apnea (adult) (pediatric): Secondary | ICD-10-CM

## 2011-12-08 DIAGNOSIS — R0683 Snoring: Secondary | ICD-10-CM

## 2011-12-08 MED ORDER — ZOLPIDEM TARTRATE 10 MG PO TABS: 10.0000 mg | ORAL_TABLET | Freq: Every evening | ORAL | Status: DC | PRN

## 2011-12-08 NOTE — Telephone Encounter (Signed)
Rx called in as directed.   

## 2011-12-08 NOTE — Progress Notes (Signed)
  Subjective:    Patient ID: [name], male    DOB: 04-Dec-1955, 56 y.o.   MRN: [id number]  HPI The patient comes in today for followup of his recent sleep study.  He was found to have mild obstructive sleep apnea, with an AHI of 13 events per hour and oxygen desaturation as low as 81%.  He was noted to have a saturation less than 88% for only 17 minutes during the night.  This does not warrant supplemental nocturnal oxygen.  It should also be noted the patient had very little REM and his total sleep time was only 177 minutes, however he had more than ample opportunity to exhibit more severe sleep disordered breathing.   Review of Systems  Constitutional: Negative for fever and unexpected weight change.  HENT: Positive for rhinorrhea and trouble swallowing. Negative for ear pain, nosebleeds, congestion, sore throat, sneezing, dental problem, postnasal drip and sinus pressure.   Eyes: Negative for redness and itching.  Respiratory: Positive for cough and wheezing. Negative for chest tightness and shortness of breath.   Cardiovascular: Negative for palpitations and leg swelling.  Gastrointestinal: Negative for nausea and vomiting.  Genitourinary: Positive for difficulty urinating. Negative for dysuria.  Musculoskeletal: Negative for joint swelling.  Skin: Positive for rash.  Neurological: Positive for headaches.  Hematological: Does not bruise/bleed easily.  Psychiatric/Behavioral: Negative for dysphoric mood. The patient is not nervous/anxious.        Objective:   Physical Exam Morbidly obese male in no acute distress Nose without purulence or discharge noted Lower extremities with mild edema, no cyanosis Alert and oriented, does not appear to be sleepy, moves all 4 extremities.       Assessment & Plan:

## 2011-12-08 NOTE — Assessment & Plan Note (Signed)
The patient has been found to have mild obstructive sleep apnea by his recent sleep study, and I have reviewed the various treatment options with him for this.  He is not a candidate for a dental appliance, and I would not recommend surgery given all of his issues and morbid obesity.  Therefore, the patient could consider a trial of weight loss alone versus CPAP while trying to lose weight.  The patient has tried a CPAP machine, and is not keen in going back on the device.  At least his sleep apnea is mild enough that it has very little impact on his cardiovascular health.  The patient would like to take the next 6 months to work aggressively on weight loss, and to see how much progress he can make.  He will call about restarting CPAP if he is not successful, or if he has worsening symptoms.  It should also be noted that although he did desaturate, he really did not desaturate long enough to require nocturnal oxygen.  We'll therefore discontinue his concentrator.

## 2011-12-08 NOTE — Telephone Encounter (Signed)
Will fill 1 mo in Dr Synthia Innocent absence Px written for call in

## 2011-12-08 NOTE — Patient Instructions (Addendum)
Will send an order to stop cpap and all oxygen Work on weight loss and conditioning.  Let me know if you would like to try cpap again, and we can restart.

## 2011-12-08 NOTE — Telephone Encounter (Signed)
Ok to refill in Dr. Synthia Innocent absence? Last filled 11/07/11. Please send back to me.

## 2011-12-19 ENCOUNTER — Other Ambulatory Visit: Payer: Self-pay | Admitting: *Deleted

## 2011-12-19 NOTE — Telephone Encounter (Signed)
OK to refill

## 2011-12-20 MED ORDER — DIAZEPAM 10 MG PO TABS: 10.0000 mg | ORAL_TABLET | Freq: Two times a day (BID) | ORAL | Status: DC | PRN

## 2011-12-20 NOTE — Telephone Encounter (Signed)
plz phone in and notify pt.

## 2011-12-20 NOTE — Telephone Encounter (Signed)
Rx called in as directed.   

## 2011-12-22 ENCOUNTER — Telehealth: Payer: Self-pay | Admitting: *Deleted

## 2011-12-22 MED ORDER — ALBUTEROL SULFATE HFA 108 (90 BASE) MCG/ACT IN AERS: 2.0000 | INHALATION_SPRAY | Freq: Four times a day (QID) | RESPIRATORY_TRACT | Status: DC | PRN

## 2011-12-22 NOTE — Telephone Encounter (Signed)
sent in

## 2011-12-22 NOTE — Telephone Encounter (Signed)
Refill request for ventolin inhaler received. Not on current med list. OK to refill?

## 2011-12-25 ENCOUNTER — Other Ambulatory Visit: Payer: Self-pay | Admitting: *Deleted

## 2011-12-25 MED ORDER — HYDROCODONE-ACETAMINOPHEN 10-325 MG PO TABS
1.0000 | ORAL_TABLET | Freq: Four times a day (QID) | ORAL | Status: DC | PRN
Start: 2011-12-25 — End: 2012-01-22

## 2011-12-25 NOTE — Telephone Encounter (Signed)
Received faxed refill request from pharmacy. Last refill 11/28/11. Is it okay to refill medication?

## 2011-12-25 NOTE — Telephone Encounter (Signed)
plz phone in. 

## 2011-12-26 NOTE — Telephone Encounter (Signed)
Rx called in as directed.   

## 2012-01-04 ENCOUNTER — Other Ambulatory Visit: Payer: Self-pay

## 2012-01-04 NOTE — Telephone Encounter (Signed)
Request for Zolpidem Tartrate 10 mg Rx was last filled on 12/08/11.

## 2012-01-05 MED ORDER — ZOLPIDEM TARTRATE 10 MG PO TABS: 10.0000 mg | ORAL_TABLET | Freq: Every evening | ORAL | Status: DC | PRN

## 2012-01-05 NOTE — Telephone Encounter (Signed)
Rx called in as directed.   

## 2012-01-05 NOTE — Telephone Encounter (Signed)
plz phone in. 

## 2012-01-05 NOTE — Telephone Encounter (Signed)
?   Dr Synthia Innocent pt

## 2012-01-05 NOTE — Telephone Encounter (Signed)
I think this is Dr Synthia Innocent patient?

## 2012-01-08 ENCOUNTER — Ambulatory Visit: Payer: Medicare PPO | Attending: Neurology | Admitting: Rehabilitative and Restorative Service Providers"

## 2012-01-08 DIAGNOSIS — IMO0001 Reserved for inherently not codable concepts without codable children: Secondary | ICD-10-CM | POA: Insufficient documentation

## 2012-01-08 DIAGNOSIS — R5381 Other malaise: Secondary | ICD-10-CM | POA: Insufficient documentation

## 2012-01-08 DIAGNOSIS — M6281 Muscle weakness (generalized): Secondary | ICD-10-CM | POA: Insufficient documentation

## 2012-01-12 ENCOUNTER — Ambulatory Visit: Payer: Medicare PPO | Admitting: Rehabilitative and Restorative Service Providers"

## 2012-01-19 ENCOUNTER — Other Ambulatory Visit: Payer: Self-pay | Admitting: *Deleted

## 2012-01-19 ENCOUNTER — Ambulatory Visit: Payer: Medicare PPO | Admitting: Family Medicine

## 2012-01-19 ENCOUNTER — Ambulatory Visit: Payer: Medicare PPO | Admitting: Physical Therapy

## 2012-01-19 MED ORDER — DIAZEPAM 10 MG PO TABS: 10.0000 mg | ORAL_TABLET | Freq: Two times a day (BID) | ORAL | Status: DC | PRN

## 2012-01-19 NOTE — Telephone Encounter (Signed)
plz phone in. 

## 2012-01-19 NOTE — Telephone Encounter (Signed)
OK to refill

## 2012-01-19 NOTE — Telephone Encounter (Signed)
Medication phoned to pharmacy.  

## 2012-01-22 ENCOUNTER — Ambulatory Visit (INDEPENDENT_AMBULATORY_CARE_PROVIDER_SITE_OTHER): Payer: Medicare PPO | Admitting: Family Medicine

## 2012-01-22 ENCOUNTER — Encounter: Payer: Self-pay | Admitting: Family Medicine

## 2012-01-22 VITALS — BP 126/84 | HR 70 | Temp 98.1°F | Ht 67.0 in | Wt 290.0 lb

## 2012-01-22 DIAGNOSIS — G8929 Other chronic pain: Secondary | ICD-10-CM

## 2012-01-22 DIAGNOSIS — G4733 Obstructive sleep apnea (adult) (pediatric): Secondary | ICD-10-CM

## 2012-01-22 DIAGNOSIS — E669 Obesity, unspecified: Secondary | ICD-10-CM

## 2012-01-22 DIAGNOSIS — M549 Dorsalgia, unspecified: Secondary | ICD-10-CM

## 2012-01-22 DIAGNOSIS — F17200 Nicotine dependence, unspecified, uncomplicated: Secondary | ICD-10-CM

## 2012-01-22 DIAGNOSIS — E119 Type 2 diabetes mellitus without complications: Secondary | ICD-10-CM

## 2012-01-22 MED ORDER — HYDROCODONE-ACETAMINOPHEN 10-325 MG PO TABS: 1.0000 | ORAL_TABLET | Freq: Four times a day (QID) | ORAL | Status: DC | PRN

## 2012-01-22 NOTE — Assessment & Plan Note (Signed)
Quit date set at 03/04/2012.  Planning on quitting cold [place].

## 2012-01-22 NOTE — Patient Instructions (Addendum)
Stop ambien. Quit date is September 30th.  Good luck! Return to see me in 3 months for follow up. We will refill vicodin early only today. Keep up good work!

## 2012-01-22 NOTE — Assessment & Plan Note (Signed)
Chronic, stable. Continue med.

## 2012-01-22 NOTE — Assessment & Plan Note (Signed)
Mild OSA, thought not to affect CV health.  Pt not interested in CPAP.

## 2012-01-22 NOTE — Assessment & Plan Note (Signed)
Chronic, weight loss noted - has ben trying - working with PT and planning on starting to go to gym. Attributes decreased appetite to cymbalta.

## 2012-01-22 NOTE — Progress Notes (Signed)
  Subjective:    Patient ID: [name], male    DOB: 04-Oct-1955, 56 y.o.   MRN: [id number]  HPI CC: 3 mo f/u  OSA - Reviewed recent note from Dr. Gwenette Greet.  Mild sleep apnea - no need for concentrator.  with an AHI of 13 events per hour and oxygen desaturation as low as 81%.   Also told had mild COPD.  Smoking - 1 ppd.  Quit date is sept 30th.  Wants to quit cold [place].    HTN - states cozaar stopped by Dr. Gwenette Greet, they feel caused worsening pain.  Compliant with toprol xl.  DM - on metformin 586m bid.  Last sugar checked was 1 wk ago, 146 (post prandial).  Last vision exam - 1 yr ago.  Foot exam today. Lab Results  Component Value Date   HGBA1C 6.1 02/14/2011   Chronic pain - feels stable on vicodin 10 qid.  States nortryptyline and cymbalta have helped with pain (nortryptyline has helped with sleep significantly, would like to stop [place].  Asks to refill vicodin early because they will be going to the beach this week (wednesday)  Obesity - 13 lb wt loss in last month!  Sees PT (per Dr. LErling Cruz and wants to start going to gym.  Congratulated on weight loss. Wt Readings from Last 3 Encounters:  01/22/12 290 lb (131.543 kg)  12/08/11 303 lb (137.44 kg)  11/08/11 310 lb 8 oz (140.842 kg)    Review of Systems Per HPI    Objective:   Physical Exam  Nursing note and vitals reviewed. Constitutional: He appears well-developed and well-nourished. No distress.  HENT:  Head: Normocephalic and atraumatic.  Mouth/Throat: Oropharynx is clear and moist. No oropharyngeal exudate.  Eyes: Conjunctivae and EOM are normal. Pupils are equal, round, and reactive to light. No scleral icterus.  Neck: Normal range of motion. Neck supple.  Cardiovascular: Normal rate, regular rhythm, normal heart sounds and intact distal pulses.   No murmur heard. Pulmonary/Chest: Effort normal and breath sounds normal. No respiratory distress. He has no wheezes. He has no rales.  Musculoskeletal: He exhibits  no edema.       Diabetic foot exam: Normal inspection No skin breakdown No calluses  Normal DP/PT pulses Normal sensation to light tough and decreased to monofilament Nails normal  Skin: Skin is warm and dry. No rash noted.  Psychiatric: He has a normal mood and affect.       Assessment & Plan:

## 2012-01-22 NOTE — Assessment & Plan Note (Signed)
Chronic, seems stable on vicodin 10/324m qid.  Continue this. Continue cymbalta and nortriptyline. He does not desire to take long-acting narcotic.

## 2012-01-26 ENCOUNTER — Ambulatory Visit: Payer: Medicare PPO | Admitting: Physical Therapy

## 2012-02-02 ENCOUNTER — Ambulatory Visit: Payer: Medicare PPO | Admitting: Physical Therapy

## 2012-02-07 ENCOUNTER — Telehealth: Payer: Self-pay

## 2012-02-07 NOTE — Telephone Encounter (Signed)
I'm sorry brother passed.  plz notify he is already on valium, nortriptyline and cymbalta which should help with this.  No real good option for further treatment.  Is he plugged into grief counseling?  If brother was involved in hospice, he has option of seeing counselor for this which may be healthiest way to cope with grief.

## 2012-02-07 NOTE — Telephone Encounter (Signed)
Spoke with patient's wife and advised no good meds to add to what he is already taking due to safety. Advised to continue those and look into counseling with hospice (brother had their services). She verbalized understanding.

## 2012-02-07 NOTE — Telephone Encounter (Signed)
pts wife request med for nerves and anxiousness due to pt's brother dying 02/06/12.[facility].Please advise.

## 2012-02-09 ENCOUNTER — Ambulatory Visit: Payer: Medicare PPO | Admitting: Rehabilitative and Restorative Service Providers"

## 2012-02-09 ENCOUNTER — Ambulatory Visit: Payer: Medicare PPO | Admitting: Physical Therapy

## 2012-02-09 ENCOUNTER — Other Ambulatory Visit: Payer: Self-pay | Admitting: Family Medicine

## 2012-02-13 ENCOUNTER — Ambulatory Visit: Payer: Medicare PPO | Admitting: Rehabilitative and Restorative Service Providers"

## 2012-02-19 ENCOUNTER — Other Ambulatory Visit: Payer: Self-pay

## 2012-02-19 MED ORDER — DIAZEPAM 10 MG PO TABS: 10.0000 mg | ORAL_TABLET | Freq: Two times a day (BID) | ORAL | Status: DC | PRN

## 2012-02-19 MED ORDER — HYDROCODONE-ACETAMINOPHEN 10-325 MG PO TABS: 1.0000 | ORAL_TABLET | Freq: Four times a day (QID) | ORAL | Status: DC | PRN

## 2012-02-19 NOTE — Telephone Encounter (Signed)
PTs wife left v/m requesting refill Hydrocodone and diazepam to [facility]. Mrs. Sofranko message said requested Diazepam yesterday and Hydrocodone earlier today? Pt helped remove riding lawnmower from truck and pulled back.Please advise.

## 2012-02-19 NOTE — Telephone Encounter (Signed)
plz phone in. 

## 2012-02-20 NOTE — Telephone Encounter (Signed)
Rx's called in as directed.

## 2012-02-21 ENCOUNTER — Other Ambulatory Visit: Payer: Self-pay | Admitting: Family Medicine

## 2012-02-23 ENCOUNTER — Ambulatory Visit: Payer: Medicare PPO | Attending: Neurology | Admitting: Rehabilitative and Restorative Service Providers"

## 2012-02-23 DIAGNOSIS — IMO0001 Reserved for inherently not codable concepts without codable children: Secondary | ICD-10-CM | POA: Insufficient documentation

## 2012-02-23 DIAGNOSIS — M6281 Muscle weakness (generalized): Secondary | ICD-10-CM | POA: Insufficient documentation

## 2012-02-23 DIAGNOSIS — R5381 Other malaise: Secondary | ICD-10-CM | POA: Insufficient documentation

## 2012-02-28 ENCOUNTER — Other Ambulatory Visit: Payer: Self-pay | Admitting: *Deleted

## 2012-03-08 ENCOUNTER — Ambulatory Visit: Payer: Medicare PPO | Admitting: Rehabilitative and Restorative Service Providers"

## 2012-03-15 ENCOUNTER — Other Ambulatory Visit: Payer: Self-pay | Admitting: Family Medicine

## 2012-03-19 ENCOUNTER — Other Ambulatory Visit: Payer: Self-pay | Admitting: Family Medicine

## 2012-03-19 NOTE — Telephone Encounter (Signed)
plz phone in. 

## 2012-03-19 NOTE — Telephone Encounter (Signed)
OK to refill

## 2012-03-20 NOTE — Telephone Encounter (Signed)
Rx's called in as directed.

## 2012-04-17 ENCOUNTER — Other Ambulatory Visit: Payer: Self-pay | Admitting: Family Medicine

## 2012-04-17 NOTE — Telephone Encounter (Signed)
plz phone in. 

## 2012-04-17 NOTE — Telephone Encounter (Signed)
Ok to refill 

## 2012-04-17 NOTE — Telephone Encounter (Signed)
Rx called in as directed.   

## 2012-04-23 ENCOUNTER — Encounter: Payer: Self-pay | Admitting: Family Medicine

## 2012-04-23 ENCOUNTER — Ambulatory Visit (INDEPENDENT_AMBULATORY_CARE_PROVIDER_SITE_OTHER): Payer: Medicare PPO | Admitting: Family Medicine

## 2012-04-23 VITALS — BP 132/82 | HR 80 | Temp 98.2°F | Wt 287.0 lb

## 2012-04-23 DIAGNOSIS — Z23 Encounter for immunization: Secondary | ICD-10-CM

## 2012-04-23 DIAGNOSIS — R634 Abnormal weight loss: Secondary | ICD-10-CM

## 2012-04-23 DIAGNOSIS — E785 Hyperlipidemia, unspecified: Secondary | ICD-10-CM

## 2012-04-23 DIAGNOSIS — Z125 Encounter for screening for malignant neoplasm of prostate: Secondary | ICD-10-CM

## 2012-04-23 DIAGNOSIS — E119 Type 2 diabetes mellitus without complications: Secondary | ICD-10-CM

## 2012-04-23 DIAGNOSIS — E559 Vitamin D deficiency, unspecified: Secondary | ICD-10-CM

## 2012-04-23 DIAGNOSIS — F17200 Nicotine dependence, unspecified, uncomplicated: Secondary | ICD-10-CM

## 2012-04-23 NOTE — Assessment & Plan Note (Signed)
Continue to encourage cessation.

## 2012-04-23 NOTE — Assessment & Plan Note (Signed)
Has had normal CXR.  Due for colonoscopy.  Encouraged schedule this given fmhx. Due for prostate cancer screening, will do at upcoming CPE.

## 2012-04-23 NOTE — Patient Instructions (Addendum)
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 Schedule physical at your convenience in next few months. Good to see you today, call us with questions. Lets increase nortriptyline to 54m daily (2 pills daily) to see if any improvement in pain.  If not helping, then may back down to 272mdaily. Flu shot today. Schedule eye doctor appointment.

## 2012-04-23 NOTE — Progress Notes (Signed)
  Subjective:    Patient ID: [name], male    DOB: 1955/06/27, 56 y.o.   MRN: [id number]  HPI CC: 3 mo f/u  Saw Dr. Erling Cruz 04/02/2012 - losing R arm muscle mass.  Having aching R arm thought due to nerve damage, likely chronic.  Also was concerned about weight loss.  Smoking - 1/2 ppd.  Denies cough, SOB, wheezing.  Last CXR 10/2011.  DM - due for eye exam.  Wonders if needs metformin with weight loss.  Noted R neck swelling for the last month.  Nontender.  Wt Readings from Last 3 Encounters:  04/23/12 287 lb (130.182 kg)  01/22/12 290 lb (131.543 kg)  12/08/11 303 lb (137.44 kg)  not trying to lose weight.  Continues to lose weight.  Due for preventative medicine f/u.  Will return for CPE and further discuss.  Due for prostate screening, due for colonoscopy (rec rpt 05/2012). CXR last done 10/2011  Past Medical History  Diagnosis Date  . CAD (coronary artery disease)     x3 with stents last 2005, EF 40%  . Cardiomyopathy   . 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 COPD (chronic obstructive pulmonary disease) 10/2011    minimal by PFTs  . T2DM (type 2 diabetes mellitus)   . Disturbances of sensation of smell and taste   . Hepatic steatosis   . Gout   . Hidradenitis suppurativa 2011    followd by Lyndle Herrlich - daily bactrim, s/p intralesional steroid injection 10/2010  . HLD (hyperlipidemia)   . HTN (hypertension)   . History of MI (myocardial infarction)   . Obesity   . OSA (obstructive sleep apnea)   . Tobacco use disorder     cigar  . History of non anemic vitamin B12 deficiency     s/p B12 shots, now on oral supplementation (10/2011)  . Vitamin D deficiency   . Weight loss   . Allergic rhinitis   . Cervical spondylosis 05/2010    s/p surgery  . DDD (degenerative disc disease)   . Lumbar herniated disc   . Spinal stenosis     released from [place].  Pain meds prescribed by Danise Mina     Review of Systems Per HPI    Objective:   Physical  Exam  Nursing note and vitals reviewed. Constitutional: He appears well-developed and well-nourished. No distress.       obese  HENT:  Head: Normocephalic and atraumatic.    Right Ear: External ear normal.  Left Ear: External ear normal.  Mouth/Throat: Oropharynx is clear and moist. No oropharyngeal exudate.       R submandibular induration, nontender about 1 cm size  Eyes: Conjunctivae normal and EOM are normal. Pupils are equal, round, and reactive to light. No scleral icterus.  Neck: Normal range of motion. Neck supple.  Cardiovascular: Normal rate, regular rhythm, normal heart sounds and intact distal pulses.   No murmur heard. Pulmonary/Chest: Effort normal and breath sounds normal. No respiratory distress. He has no wheezes. He has no rales.  Abdominal: Soft. Bowel sounds are normal. He exhibits no distension. There is tenderness (mild epigastric). There is no rebound and no guarding.  Lymphadenopathy:       Head (left side): No submandibular adenopathy present.  Skin: Skin is warm and dry.  Psychiatric: He has a normal mood and affect.       Assessment & Plan:

## 2012-04-23 NOTE — Assessment & Plan Note (Signed)
Check blood work when returns fasting tomorrow.  Recommended schedule eye exam.

## 2012-04-24 ENCOUNTER — Other Ambulatory Visit: Payer: Medicare PPO

## 2012-04-26 ENCOUNTER — Other Ambulatory Visit: Payer: Medicare PPO

## 2012-04-29 ENCOUNTER — Other Ambulatory Visit: Payer: Medicare PPO

## 2012-05-06 ENCOUNTER — Telehealth: Payer: Self-pay

## 2012-05-06 ENCOUNTER — Other Ambulatory Visit (INDEPENDENT_AMBULATORY_CARE_PROVIDER_SITE_OTHER): Payer: Medicare PPO

## 2012-05-06 DIAGNOSIS — Z125 Encounter for screening for malignant neoplasm of prostate: Secondary | ICD-10-CM

## 2012-05-06 DIAGNOSIS — E785 Hyperlipidemia, unspecified: Secondary | ICD-10-CM

## 2012-05-06 DIAGNOSIS — E559 Vitamin D deficiency, unspecified: Secondary | ICD-10-CM

## 2012-05-06 DIAGNOSIS — R634 Abnormal weight loss: Secondary | ICD-10-CM

## 2012-05-06 DIAGNOSIS — E119 Type 2 diabetes mellitus without complications: Secondary | ICD-10-CM

## 2012-05-06 LAB — COMPREHENSIVE METABOLIC PANEL
Alkaline Phosphatase: 77 U/L (ref 39–117)
CO2: 29 mEq/L (ref 19–32)
Creatinine, Ser: 0.9 mg/dL (ref 0.4–1.5)
GFR: 93.59 mL/min (ref >60.00)
Glucose, Bld: 117 mg/dL — ABNORMAL HIGH (ref 70–99)
Sodium: 137 mEq/L (ref 135–145)
Total Bilirubin: 0.8 mg/dL (ref 0.3–1.2)

## 2012-05-06 LAB — LIPID PANEL
Cholesterol: 192 mg/dL (ref 0–200)
HDL: 34.3 mg/dL — ABNORMAL LOW (ref >39.00)
Total CHOL/HDL Ratio: 6
Triglycerides: 220 mg/dL — ABNORMAL HIGH (ref 0.0–149.0)

## 2012-05-06 LAB — MICROALBUMIN / CREATININE URINE RATIO
Creatinine,U: 46.9 mg/dL
Microalb Creat Ratio: 3.8 mg/g (ref 0.0–30.0)
Microalb, Ur: 1.8 mg/dL (ref 0.0–1.9)

## 2012-05-06 LAB — CBC WITH DIFFERENTIAL/PLATELET
Basophils Absolute: 0.1 10*3/uL (ref 0.0–0.1)
Eosinophils Absolute: 0.3 10*3/uL (ref 0.0–0.7)
Lymphocytes Relative: 29.8 % (ref 12.0–46.0)
MCHC: 33 g/dL (ref 30.0–36.0)
MCV: 99.9 fl (ref 78.0–100.0)
Monocytes Absolute: 0.5 10*3/uL (ref 0.1–1.0)
Neutrophils Relative %: 62.6 % (ref 43.0–77.0)
Platelets: 196 10*3/uL (ref 150.0–400.0)
RDW: 14.6 % (ref 11.5–14.6)

## 2012-05-06 LAB — TSH: TSH: 1.18 u[IU]/mL (ref 0.35–5.50)

## 2012-05-06 LAB — LDL CHOLESTEROL, DIRECT: Direct LDL: 128 mg/dL

## 2012-05-06 NOTE — Telephone Encounter (Signed)
Given triage note from [name] in lab; pt mentioned to [facility] had numbness, tingling and weakness but refused appt. Pt left note please read and seek medical attention if have these problems(pt left papers from [facility] for back pain). Pt has taken Percocet for pain but pain relief only last about 3.5 hours. Pt still taking vicodin 10/325 mg. Pt spoke with Dr Darnell Level briefly in hall way. Please advise.(paperwork left with triage note from [facility] in Dr Synthia Innocent in box.

## 2012-05-07 LAB — VITAMIN D 25 HYDROXY (VIT D DEFICIENCY, FRACTURES): Vit D, 25-Hydroxy: 23 ng/mL — ABNORMAL LOW (ref 30–89)

## 2012-05-08 NOTE — Telephone Encounter (Signed)
Due for CPE, we may discuss this at CPE.  Please have him schedule appt. Paper he brings is generic back pain information sheet - not specific to his circumstances.

## 2012-05-09 ENCOUNTER — Encounter: Payer: Self-pay | Admitting: Family Medicine

## 2012-05-10 NOTE — Telephone Encounter (Signed)
CPE scheduled.

## 2012-05-15 ENCOUNTER — Other Ambulatory Visit: Payer: Self-pay | Admitting: Family Medicine

## 2012-05-15 NOTE — Telephone Encounter (Signed)
Rx's called in as directed.

## 2012-05-15 NOTE — Telephone Encounter (Signed)
plz phone in. 

## 2012-06-07 ENCOUNTER — Ambulatory Visit (INDEPENDENT_AMBULATORY_CARE_PROVIDER_SITE_OTHER): Payer: Medicare PPO | Admitting: Family Medicine

## 2012-06-07 ENCOUNTER — Encounter: Payer: Self-pay | Admitting: Family Medicine

## 2012-06-07 VITALS — BP 120/80 | HR 76 | Temp 98.4°F | Ht 67.25 in | Wt 291.0 lb

## 2012-06-07 DIAGNOSIS — G8929 Other chronic pain: Secondary | ICD-10-CM

## 2012-06-07 DIAGNOSIS — E785 Hyperlipidemia, unspecified: Secondary | ICD-10-CM

## 2012-06-07 DIAGNOSIS — Z1211 Encounter for screening for malignant neoplasm of colon: Secondary | ICD-10-CM

## 2012-06-07 DIAGNOSIS — E119 Type 2 diabetes mellitus without complications: Secondary | ICD-10-CM

## 2012-06-07 DIAGNOSIS — I10 Essential (primary) hypertension: Secondary | ICD-10-CM

## 2012-06-07 DIAGNOSIS — Z8639 Personal history of other endocrine, nutritional and metabolic disease: Secondary | ICD-10-CM

## 2012-06-07 DIAGNOSIS — R439 Unspecified disturbances of smell and taste: Secondary | ICD-10-CM

## 2012-06-07 DIAGNOSIS — Z0000 Encounter for general adult medical examination without abnormal findings: Secondary | ICD-10-CM | POA: Insufficient documentation

## 2012-06-07 DIAGNOSIS — R634 Abnormal weight loss: Secondary | ICD-10-CM

## 2012-06-07 DIAGNOSIS — G4733 Obstructive sleep apnea (adult) (pediatric): Secondary | ICD-10-CM

## 2012-06-07 DIAGNOSIS — I714 Abdominal aortic aneurysm, without rupture: Secondary | ICD-10-CM

## 2012-06-07 DIAGNOSIS — G600 Hereditary motor and sensory neuropathy: Secondary | ICD-10-CM

## 2012-06-07 DIAGNOSIS — M549 Dorsalgia, unspecified: Secondary | ICD-10-CM

## 2012-06-07 DIAGNOSIS — F17200 Nicotine dependence, unspecified, uncomplicated: Secondary | ICD-10-CM

## 2012-06-07 MED ORDER — METFORMIN HCL 500 MG PO TABS: 500.0000 mg | ORAL_TABLET | Freq: Every day | ORAL | Status: DC

## 2012-06-07 MED ORDER — NORTRIPTYLINE HCL 50 MG PO CAPS: 50.0000 mg | ORAL_CAPSULE | Freq: Every day | ORAL | Status: DC

## 2012-06-07 NOTE — Assessment & Plan Note (Addendum)
Great control withl  ast A1c 5.8% - will decrease metformin to 566m nightly. If DM continues well controlled, consider D/C metformin.

## 2012-06-07 NOTE — Patient Instructions (Addendum)
Pass by Marion's office to schedule ultrasound for AAA and colonsocopy as you're due (given family history). increase nortriptyline to 32m nightly. Decrease metformin to 1 pill at night time. Return in 4 months for follow up diabetes.

## 2012-06-07 NOTE — Assessment & Plan Note (Signed)
Reviewed #s.  Not at goal given CAD. Intolerant of statins - all caused SOB. Hesitant for red yeast rice.  consider zetia?

## 2012-06-07 NOTE — Assessment & Plan Note (Signed)
Per pt history of this, last check 3.2cm in diameter.  Will obtain abd [place] to eval size.

## 2012-06-07 NOTE — Progress Notes (Signed)
Subjective:    Patient ID: [name], male    DOB: 01/22/1956, 57 y.o.   MRN: [id number]  HPI CC: medicare wellness  Appetite returning.  Taste sensation returning.  Weight up 5 lbs. Wt Readings from Last 3 Encounters:  06/07/12 291 lb (131.997 kg)  04/23/12 287 lb (130.182 kg)  01/22/12 290 lb (131.543 kg)    HLD - intolerant of statins.  Interested in red yeast rice.  Discussed similarity to statins.  Pt will avoid.  States has h/o abd aortic aneurysm.  No abd [place] in last several years.  States last diameter was 3.2cm  Hearing screen normal today Vision screen not done - pt planning on seeing eye doctor next month. Falls - at risk due to charcot marie.  To see Dr. Erling Cruz later this month.  Preventative: Due for prostate screening today colonoscopy - done 05/2007, rec rpt 05/2012 given fmhx. (Dr. Collene Mares) CXR last done 10/2011 Flu shot 2013 Tetanus 2007 Pneumovax 2007 Advanced directives: did not address.  Medications and allergies reviewed and updated in chart.  Past histories reviewed and updated if relevant as below. Patient Active Problem List  Diagnosis  . HYPERLIPIDEMIA  . GOUT  . OBESITY  . TOBACCO ABUSE  . CHARCOT-MARIE-TOOTH DISEASE  . HYPERTENSION  . CAD  . CARDIOMYOPATHY  . COPD, minimal  . Hidradenitis  . Chronic back pain  . OSA (obstructive sleep apnea)  . Diabetes type 2, controlled  . Disturbances of sensation of smell and taste  . WEIGHT LOSS, RECENT  . History of non anemic vitamin B12 deficiency  . Vitamin D deficiency  . Skin rash  . Cervical spondylosis  . DDD (degenerative disc disease)  . DOE (dyspnea on exertion)  . AAA (abdominal aortic aneurysm)  . Medicare annual wellness visit, initial   Past Medical History  Diagnosis Date  . CAD (coronary artery disease)     x3 with stents last 2005, EF 40%  . Cardiomyopathy   . 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 COPD (chronic obstructive pulmonary disease)  10/2011    minimal by PFTs  . T2DM (type 2 diabetes mellitus)   . Disturbances of sensation of smell and taste   . Hepatic steatosis   . Gout   . Hidradenitis suppurativa 2011    followd by Lyndle Herrlich - daily bactrim, s/p intralesional steroid injection 10/2010  . HLD (hyperlipidemia)   . HTN (hypertension)   . History of MI (myocardial infarction)   . Obesity   . OSA (obstructive sleep apnea)   . Tobacco use disorder     cigar  . History of non anemic vitamin B12 deficiency     s/p B12 shots, now on oral supplementation (10/2011)  . Vitamin D deficiency   . Weight loss   . Allergic rhinitis   . Cervical spondylosis 05/2010    s/p surgery  . DDD (degenerative disc disease)   . Lumbar herniated disc   . Spinal stenosis     released from [place].  Pain meds prescribed by Danise Mina  . AAA (abdominal aortic aneurysm)    Past Surgical History  Procedure Date  . Tonsillectomy and adenoidectomy 1972  . Lumbar disc surgery     L5-S1  . Cervical discectomy 01/2010    Anterior C4-7, cervical cord decompression and B foraminotomy ([place])(metal plate)  . Lumbar laminectomy 12/11    L2-5laminectomy/foraminotomy for stenosis ([place])  . Horizon study stenting x4 2000, 2005  in the RCA with kissing balloon angioplasty, of the PDA ostium-5 stents total per patient  . Cardiac catheterization 03/30/05    Left, Left ventriculography, coronary angiography, intravascular [place] of RCA  . Myelogram     L5-S1 and L1-2 spondylosis  . Scrotal US 07/2010    hydradenitis - testes WNL, small R epididymal cyst, small L varicocele, no sinus tract mention  . Colonoscopy 05/06/2007    normal, small int hemorrhoids rpt 5 yrs due to fmhx   History  Substance Use Topics  . Smoking status: Current Every Day Smoker -- 0.5 packs/day for 43 years    Types: Cigarettes, Pipe  . Smokeless tobacco: Never Used  . Alcohol Use: No   Family History  Problem Relation Age of Onset  . Cancer Mother     colon  .  Diabetes Mother   . Kidney disease Mother   . Aneurysm Mother     AAA  . Cancer Father     skin  . Cancer Brother     skin  . Coronary artery disease Brother   . Cancer Brother     small cell lung cancer  . Aneurysm Brother     AAA  . Rheum arthritis Mother   . Rheum arthritis Sister   . Rheum arthritis Brother    Allergies  Allergen Reactions  . Statins Shortness Of Breath    Cough, trouble breathing   Current Outpatient Prescriptions on File Prior to Visit  Medication Sig Dispense Refill  . albuterol (PROVENTIL HFA;VENTOLIN HFA) 108 (90 BASE) MCG/ACT inhaler Inhale 2 puffs into the lungs every 6 (six) hours as needed for wheezing.  1 Inhaler  6  . albuterol (PROVENTIL) (2.5 MG/3ML) 0.083% nebulizer solution USE 1 VIAL PER NEBULIZER EVERY 6 HRS AS NEEDED FOR WHEEZING  75 mL  12  . aspirin 325 MG tablet Take 325 mg by mouth daily.        . Benzoyl Peroxide 3.5 % LIQD Use OTC wash daily      . Cholecalciferol (VITAMIN D) 2000 UNITS CAPS Take 1 capsule (2,000 Units total) by mouth daily.  30 capsule  11  . clindamycin (CLEOCIN T) 1 % external solution Apply topically 2 (two) times daily.  30 mL  0  . diazepam (VALIUM) 10 MG tablet TAKE 1 TABLET BY MOUTH TWICE DAILY  60 tablet  0  . doxycycline (VIBRAMYCIN) 100 MG capsule Take 100 mg by mouth 2 (two) times daily.      . DULoxetine (CYMBALTA) 60 MG capsule Take 60 mg by mouth daily.      [name] erythromycin with ethanol (EMGEL) 2 % gel Apply topically Daily.       . febuxostat (ULORIC) 40 MG tablet Take 40 mg by mouth daily.       . fluticasone (FLONASE) 50 MCG/ACT nasal spray USE 2 SPRAY IN EACH NOSTRIL ONCE DAILY  16 g  3  . folic acid (FOLVITE) 1 MG tablet Take 1 tablet (1 mg total) by mouth daily.  90 tablet  3  . metFORMIN (GLUCOPHAGE) 500 MG tablet Take 1 tablet (500 mg total) by mouth at bedtime.  90 tablet  3  . metoprolol (TOPROL-XL) 50 MG 24 hr tablet TAKE 1/2 TABLET EVERY DAY  30 tablet  1  . nabumetone (RELAFEN) 750 MG  tablet TAKE 1 TABLET (750 MG TOTAL) BY MOUTH 2 (TWO) TIMES DAILY.  60 tablet  6  . nortriptyline (PAMELOR) 50 MG capsule Take 1 capsule (50  mg total) by mouth at bedtime.  30 capsule  11  . vitamin B-12 (CYANOCOBALAMIN) 500 MCG tablet Take 500 mcg by mouth daily.         Review of Systems  Constitutional: Negative for fever, chills, activity change, appetite change, fatigue and unexpected weight change.  HENT: Negative for hearing loss and neck pain.   Eyes: Negative for visual disturbance.  Respiratory: Negative for cough, chest tightness, shortness of breath and wheezing.   Cardiovascular: Negative for chest pain, palpitations and leg swelling.  Gastrointestinal: Positive for blood in stool (red blood). Negative for nausea, vomiting, abdominal pain, diarrhea, constipation and abdominal distention.  Genitourinary: Negative for hematuria and difficulty urinating.  Musculoskeletal: Positive for back pain and arthralgias. Negative for myalgias.       Endorses bone pain as well, mainly RLE.  rec return for further eval of this  Skin: Negative for rash.  Neurological: Positive for dizziness. Negative for seizures, syncope and headaches.  Hematological: Does not bruise/bleed easily.  Psychiatric/Behavioral: Negative for dysphoric mood. The patient is not nervous/anxious.        Objective:   Physical Exam  Nursing note and vitals reviewed. Constitutional: He is oriented to person, place, and time. He appears well-developed and well-nourished. No distress.       obese  HENT:  Head: Normocephalic and atraumatic.  Right Ear: Hearing, tympanic membrane, external ear and ear canal normal.  Left Ear: Hearing, tympanic membrane, external ear and ear canal normal.  Nose: Nose normal.  Mouth/Throat: Oropharynx is clear and moist. No oropharyngeal exudate.  Eyes: Conjunctivae normal and EOM are normal. Pupils are equal, round, and reactive to light. No scleral icterus.  Neck: Normal range of  motion. Neck supple. No thyromegaly present.  Cardiovascular: Normal rate, regular rhythm, normal heart sounds and intact distal pulses.   No murmur heard. Pulses:      Radial pulses are 2+ on the right side, and 2+ on the left side.  Pulmonary/Chest: Effort normal and breath sounds normal. No respiratory distress. He has no wheezes. He has no rales.  Abdominal: Soft. Bowel sounds are normal. He exhibits no distension and no mass. There is no tenderness. There is no rebound and no guarding.       No abd/renal bruits  Genitourinary: Rectum normal. Rectal exam shows no external hemorrhoid, no internal hemorrhoid, no fissure, no mass, no tenderness and anal tone normal. Prostate is enlarged (15gm). Prostate is not tender.  Musculoskeletal: Normal range of motion. He exhibits no edema.  Lymphadenopathy:    He has no cervical adenopathy.  Neurological: He is alert and oriented to person, place, and time.       CN grossly intact, station and gait intact  Skin: Skin is warm and dry. No rash noted.  Psychiatric: He has a normal mood and affect. His behavior is normal. Judgment and thought content normal.       Assessment & Plan:

## 2012-06-07 NOTE — Assessment & Plan Note (Signed)
Improving, appetite returning.

## 2012-06-07 NOTE — Assessment & Plan Note (Signed)
Complaint with oral supplementation. Lab Results  Component Value Date   [id number] 02/14/2011  consider recheck next blood draw.

## 2012-06-07 NOTE — Assessment & Plan Note (Signed)
Continued to encourage smoking cessation.

## 2012-06-07 NOTE — Assessment & Plan Note (Signed)
Chronic, stable. Continue metoprolol.

## 2012-06-07 NOTE — Assessment & Plan Note (Signed)
To see Dr. Erling Cruz in f/u

## 2012-06-07 NOTE — Assessment & Plan Note (Signed)
I have personally reviewed the Medicare Annual Wellness questionnaire and have noted 1. The patient's medical and social history 2. Their use of alcohol, tobacco or illicit drugs 3. Their current medications and supplements 4. The patient's functional ability including ADL's, fall risks, home safety risks and hearing or visual impairment. 5. Diet and physical activity 6. Evidence for depression or mood disorders The patients weight, height, BMI have been recorded in the chart.  Hearing and vision has been addressed. I have made referrals, counseling and provided education to the patient based review of the above and I have provided the pt with a written personalized care plan for preventive services. See scanned questionairre. Advanced directives not discussed today.    Reviewed preventative protocols and updated unless pt declined. PSA/DRE reassuring today. Recommended schedule colonoscopy as due Collene Mares).

## 2012-06-07 NOTE — Assessment & Plan Note (Signed)
Seems to be resolving.  Continue to monitor. rec schedule colonoscopy.

## 2012-06-07 NOTE — Assessment & Plan Note (Signed)
Mild, off CPAP

## 2012-06-07 NOTE — Assessment & Plan Note (Signed)
Chronic. Again discussed narcotic options.  Pt opts to continue vicodin. Continue cymbalta, increase nortriptyline to 71m nightly Does not desire long-acting narcotics

## 2012-06-12 ENCOUNTER — Other Ambulatory Visit: Payer: Self-pay | Admitting: Family Medicine

## 2012-06-12 NOTE — Telephone Encounter (Signed)
Ok to refill 

## 2012-06-12 NOTE — Telephone Encounter (Signed)
plz phone in. 

## 2012-06-12 NOTE — Telephone Encounter (Signed)
Rx called in as directed.   

## 2012-06-13 ENCOUNTER — Other Ambulatory Visit: Payer: Self-pay | Admitting: Family Medicine

## 2012-06-13 NOTE — Telephone Encounter (Signed)
plz phone in. 

## 2012-06-13 NOTE — Telephone Encounter (Signed)
Rx called in as directed.   

## 2012-07-10 ENCOUNTER — Other Ambulatory Visit: Payer: Self-pay | Admitting: Family Medicine

## 2012-07-10 NOTE — Telephone Encounter (Signed)
Rx's called in as directed.

## 2012-07-10 NOTE — Telephone Encounter (Signed)
plz phone in. 

## 2012-07-23 DIAGNOSIS — R0989 Other specified symptoms and signs involving the circulatory and respiratory systems: Secondary | ICD-10-CM

## 2012-07-25 ENCOUNTER — Encounter: Payer: Self-pay | Admitting: Family Medicine

## 2012-07-29 ENCOUNTER — Encounter: Payer: Self-pay | Admitting: *Deleted

## 2012-08-05 ENCOUNTER — Other Ambulatory Visit: Payer: Self-pay | Admitting: Family Medicine

## 2012-08-05 NOTE — Telephone Encounter (Signed)
Ok to refill 

## 2012-08-06 ENCOUNTER — Telehealth: Payer: Self-pay | Admitting: *Deleted

## 2012-08-06 NOTE — Telephone Encounter (Signed)
Pt's wife called requesting refills of pt's diazepam, and hydrocodone ( requests sent electronically by pharmacy, pending auth) she also wanted to inform you that per Dr Erling Cruz pt's "R hip is dying and he should have been using a walker". Per wife pt refuses to use walker and he is scheduled to have hip x rays later this week.

## 2012-08-07 MED ORDER — DIAZEPAM 10 MG PO TABS: ORAL_TABLET | ORAL | Status: DC

## 2012-08-07 NOTE — Telephone Encounter (Signed)
plz phone in valium. He received #120 pills of hydrocodone 10/38m from me 07/10/2012 and then #120 pills of oxycodone 10/325 mg from Dr. LErling Cruzon 07/17/2012. When I started filling his narcotics he signed pain contract (07/01/2011) saying I would be the only prescribing doctors.  He has violated this contract. I will not refill hydrocodone this month as he got double narcotics last month - and from now on he needs to notify me if he receives narcotic from another provider or if he desires a change in med. If he violates again I will have to stop prescribing narcotics for him.

## 2012-08-07 NOTE — Telephone Encounter (Signed)
Rx called in as directed. Spoke with patient's wife and she confirmed he did get oxycodone from Dr. Erling Cruz and took them for 2 weeks, but then "flushed the rest down the toilet because they were too strong." I advised that due to the fact he had signed a pain contract and essentially violated it, you would not refill hydrocodone this month. I advised that if he received narcotics from another Amarri Michaelson again without notifying you that you would no longer prescribe narcotics for him. She verbalized understanding and asked when meds could be refilled. I advised next month.

## 2012-08-12 ENCOUNTER — Ambulatory Visit
Admission: RE | Admit: 2012-08-12 | Discharge: 2012-08-12 | Disposition: A | Discharge status: Home / Self Care | Payer: Medicare PPO | Source: Ambulatory Visit | Attending: Neurology | Admitting: Neurology

## 2012-08-12 ENCOUNTER — Other Ambulatory Visit: Payer: Self-pay | Admitting: Neurology

## 2012-08-12 DIAGNOSIS — M25551 Pain in right hip: Secondary | ICD-10-CM

## 2012-08-15 ENCOUNTER — Ambulatory Visit (INDEPENDENT_AMBULATORY_CARE_PROVIDER_SITE_OTHER): Payer: Medicare PPO | Admitting: Family Medicine

## 2012-08-15 ENCOUNTER — Encounter: Payer: Self-pay | Admitting: Family Medicine

## 2012-08-15 VITALS — BP 148/92 | HR 80 | Temp 98.1°F | Wt 290.2 lb

## 2012-08-15 DIAGNOSIS — G8929 Other chronic pain: Secondary | ICD-10-CM

## 2012-08-15 DIAGNOSIS — M25559 Pain in unspecified hip: Secondary | ICD-10-CM

## 2012-08-15 DIAGNOSIS — M549 Dorsalgia, unspecified: Secondary | ICD-10-CM

## 2012-08-15 DIAGNOSIS — M25551 Pain in right hip: Secondary | ICD-10-CM

## 2012-08-15 MED ORDER — HYDROCODONE-ACETAMINOPHEN 10-325 MG PO TABS: ORAL_TABLET | ORAL | Status: DC

## 2012-08-15 NOTE — Progress Notes (Signed)
  Subjective:    Patient ID: [name], male    DOB: [birth year], 57 y.o.   MRN: [id number]  HPI CC: discuss pain meds.  See recent phone note.   Received oxycodone/acetaminophen #120 from neurologist Dr. Erling Cruz on 07/17/2012 despite being on pain contract with myself and receiving #90 hydrocodone 10/325 from myself 07/10/2012.  States took oxycodones 2/2 acutely worsened pain after fall down stairs last month - only documentation in Dr. Tressia Danas note is 1 fall in January out of bed. Also endorses used different pharmacy because usual one - [facility] - was closed during recent snow day. States only took oxycodone for a few days then flushed rest of pills down toilet 2/2 oversedation.  Persistent R hip pain.  Xray by Dr. Erling Cruz found mild DJD R hip.  Dr. Erling Cruz recommended ortho referral. Recent xray reviewed - IMPRESSION:  Mild degenerative change in the right hip.  Still hasn't had time to schedule colonoscopy or AAA duplex.  Tramadol doesn't help pain, refuses to try.  Review of Systems Per HPI    Objective:   Physical Exam  Nursing note and vitals reviewed. Constitutional: He appears well-developed and well-nourished. No distress.  HENT:  Mouth/Throat: Oropharynx is clear and moist. No oropharyngeal exudate.  Cardiovascular: Normal rate, regular rhythm, normal heart sounds and intact distal pulses.   No murmur heard. Pulmonary/Chest: Effort normal and breath sounds normal. No respiratory distress. He has no wheezes. He has no rales.  Musculoskeletal: He exhibits no edema.  No pain at hip joint with int/ext rotation at hip. Neg SLR bilaterally. No pain at SIJ with FABER No pain to palpation of SIJ or GTB. + pain at R sciatic notch       Assessment & Plan:

## 2012-08-15 NOTE — Patient Instructions (Addendum)
Let us or Dr. Erling Cruz know who you want to see for orthopedist. You need to only have one provider prescribe your narcotics and only have one pharmacy to refill narcotics. Copy of pain referral provided today.

## 2012-08-16 ENCOUNTER — Encounter: Payer: Self-pay | Admitting: Family Medicine

## 2012-08-16 DIAGNOSIS — M1611 Unilateral primary osteoarthritis, right hip: Secondary | ICD-10-CM | POA: Insufficient documentation

## 2012-08-16 NOTE — Assessment & Plan Note (Addendum)
Reviewed pain contract which he signed 06/2011 - printed out copy for his records. Discussed he violated contract when he requested narcotics from neurology and didn't call to notify me - however he states this was done seeking treatment for acute problem different form that for which he receives pain medication from me (which is technically allowed in pain contract). Advised he needs to only fill narcotic using one pharmacy and he only needs to receive controlled medication from one provider - has refused pain management referral in past. If repeat offense I will not continue prescribing narcotics. Pt expresses understanding I've called in hydrocodone prescription #120. Continue cymbalta, nortriptyline and NSAID. A total of 25 minutes were spent face-to-face with the patient during this encounter and over half of that time was spent on counseling and coordination of care

## 2012-08-16 NOTE — Assessment & Plan Note (Signed)
On exam not consistent with hip etiology but rather likely sciatica.  Advised to notify Dr. Erling Cruz of orthopedist in area for referral.

## 2012-09-05 ENCOUNTER — Telehealth: Payer: Self-pay | Admitting: Cardiology

## 2012-09-05 NOTE — Telephone Encounter (Signed)
Left message for pt to call, we need the number on the back of his insurance card.

## 2012-09-05 NOTE — Telephone Encounter (Signed)
New problem   Pt need prior auth for his medication for Metoprolol 61m..[facility] phone # [phone]

## 2012-09-06 ENCOUNTER — Telehealth: Payer: Self-pay | Admitting: Cardiology

## 2012-09-06 NOTE — Telephone Encounter (Signed)
See phone note from 09-06-12

## 2012-09-06 NOTE — Telephone Encounter (Signed)
New problem   Pt was calling back to give #'s off back of card   Member provider service [phone] is.If this isn't the correct # please call pt back at [phone].

## 2012-09-06 NOTE — Telephone Encounter (Signed)
Pt made aware have called prior auth they are reviewing and will let us know.

## 2012-09-09 ENCOUNTER — Other Ambulatory Visit: Payer: Self-pay | Admitting: Family Medicine

## 2012-09-09 NOTE — Telephone Encounter (Signed)
OK to refill

## 2012-09-09 NOTE — Telephone Encounter (Signed)
plz phone in. 

## 2012-09-10 NOTE — Telephone Encounter (Signed)
Rx called as directed.

## 2012-09-11 ENCOUNTER — Other Ambulatory Visit: Payer: Self-pay | Admitting: Family Medicine

## 2012-09-11 NOTE — Telephone Encounter (Signed)
Ok to refill 

## 2012-09-11 NOTE — Telephone Encounter (Signed)
Rx called in as directed.   

## 2012-09-11 NOTE — Telephone Encounter (Signed)
plz phone in. 

## 2012-09-12 ENCOUNTER — Other Ambulatory Visit: Payer: Self-pay | Admitting: *Deleted

## 2012-09-12 MED ORDER — METOPROLOL SUCCINATE ER 50 MG PO TB24: ORAL_TABLET | ORAL | Status: DC

## 2012-09-17 ENCOUNTER — Encounter (INDEPENDENT_AMBULATORY_CARE_PROVIDER_SITE_OTHER): Payer: Medicare PPO

## 2012-09-17 DIAGNOSIS — I714 Abdominal aortic aneurysm, without rupture: Secondary | ICD-10-CM

## 2012-09-22 ENCOUNTER — Encounter: Payer: Self-pay | Admitting: Family Medicine

## 2012-10-07 ENCOUNTER — Ambulatory Visit: Payer: Medicare PPO | Admitting: Family Medicine

## 2012-10-10 ENCOUNTER — Ambulatory Visit (INDEPENDENT_AMBULATORY_CARE_PROVIDER_SITE_OTHER): Payer: Medicare PPO | Admitting: Cardiology

## 2012-10-10 ENCOUNTER — Encounter: Payer: Self-pay | Admitting: Cardiology

## 2012-10-10 ENCOUNTER — Other Ambulatory Visit: Payer: Self-pay | Admitting: Family Medicine

## 2012-10-10 VITALS — BP 120/80 | HR 75 | Wt 292.0 lb

## 2012-10-10 DIAGNOSIS — I2510 Atherosclerotic heart disease of native coronary artery without angina pectoris: Secondary | ICD-10-CM

## 2012-10-10 DIAGNOSIS — E785 Hyperlipidemia, unspecified: Secondary | ICD-10-CM

## 2012-10-10 DIAGNOSIS — I714 Abdominal aortic aneurysm, without rupture: Secondary | ICD-10-CM

## 2012-10-10 DIAGNOSIS — F17200 Nicotine dependence, unspecified, uncomplicated: Secondary | ICD-10-CM

## 2012-10-10 DIAGNOSIS — I428 Other cardiomyopathies: Secondary | ICD-10-CM

## 2012-10-10 DIAGNOSIS — I10 Essential (primary) hypertension: Secondary | ICD-10-CM

## 2012-10-10 MED ORDER — LOSARTAN POTASSIUM 25 MG PO TABS: 25.0000 mg | ORAL_TABLET | Freq: Every day | ORAL | Status: DC

## 2012-10-10 MED ORDER — NITROGLYCERIN 0.4 MG SL SUBL: 0.4000 mg | SUBLINGUAL_TABLET | SUBLINGUAL | Status: DC | PRN

## 2012-10-10 NOTE — Telephone Encounter (Signed)
Ok to refill 

## 2012-10-10 NOTE — Assessment & Plan Note (Signed)
Plan CTA or MRA in April of 2015.

## 2012-10-10 NOTE — Progress Notes (Signed)
HPI: [name] is a pleasant gentleman who I have seen in the past for coronary artery disease. He has had prior inferior infarct with PCI of his right coronary artery and PDA. His most recent cardiac catheterization was performed on March 30, 2005. The patient had an ejection fraction of 40% with inferior akinesis at that time. However, his stents in the right coronary artery were widely patent. Note, his most recent echocardiogram on March 30, 2005 showed normal to mildly reduced LV function. Myoview in May of 2011 showed an inferior infarct but no ischemia. Ejection fraction 44%. Abdominal ultrasound in April of 2014 showed 3 x 3 cm aneurysm. Technically difficult study. Alternative imaging recommended for followup. This is followed by primary care. I last saw him in April 2013. Since then, he has some dyspnea on exertion but no orthopnea, PND, pedal edema, chest pain or syncope.   Current Outpatient Prescriptions  Medication Sig Dispense Refill  . aspirin 325 MG tablet Take 325 mg by mouth daily.        . Cholecalciferol (VITAMIN D) 2000 UNITS CAPS Take 1 capsule (2,000 Units total) by mouth daily.  30 capsule  11  . clindamycin (CLEOCIN T) 1 % external solution Apply topically 2 (two) times daily.  30 mL  0  . diazepam (VALIUM) 10 MG tablet TAKE 1 TABLET BY MOUTH 2 TIMES A DAY AS NEEDED  60 tablet  0  . doxycycline (VIBRAMYCIN) 100 MG capsule Take 100 mg by mouth 2 (two) times daily.      . DULoxetine (CYMBALTA) 60 MG capsule Take 60 mg by mouth daily.      [name] erythromycin with ethanol (EMGEL) 2 % gel Apply topically Daily.       . febuxostat (ULORIC) 40 MG tablet Take 40 mg by mouth daily.       . fluticasone (FLONASE) 50 MCG/ACT nasal spray USE 2 SPRAY IN EACH NOSTRIL ONCE DAILY  16 g  3  . folic acid (FOLVITE) 1 MG tablet Take 1 tablet (1 mg total) by mouth daily.  90 tablet  3  . HYDROcodone-acetaminophen (NORCO) 10-325 MG per tablet TAKE 1 TABLET BY MOUTH EVERY 6 HOURS AS NEEDED  FOR PAIN  120 tablet  0  . metFORMIN (GLUCOPHAGE) 500 MG tablet Take 1 tablet (500 mg total) by mouth at bedtime.  90 tablet  3  . metoprolol succinate (TOPROL-XL) 50 MG 24 hr tablet TAKE 1/2 TABLET EVERY DAY  30 tablet  6  . nabumetone (RELAFEN) 750 MG tablet TAKE 1 TABLET (750 MG TOTAL) BY MOUTH 2 (TWO) TIMES DAILY.  60 tablet  6  . nortriptyline (PAMELOR) 50 MG capsule Take 1 capsule (50 mg total) by mouth at bedtime.  30 capsule  11  . vitamin B-12 (CYANOCOBALAMIN) 500 MCG tablet Take 500 mcg by mouth daily.       No current facility-administered medications for this visit.     Past Medical History  Diagnosis Date  . CAD (coronary artery disease)     x3 with stents last 2005, EF 40%  . Cardiomyopathy   . 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 COPD (chronic obstructive pulmonary disease) 10/2011    minimal by PFTs  . T2DM (type 2 diabetes mellitus)   . Disturbances of sensation of smell and taste   . Hepatic steatosis   . Gout   . Hidradenitis suppurativa 2011    followd by Lyndle Herrlich -  daily bactrim, s/p intralesional steroid injection 10/2010  . HLD (hyperlipidemia)   . HTN (hypertension)   . History of MI (myocardial infarction)   . Obesity   . OSA (obstructive sleep apnea)   . Tobacco use disorder     cigar  . History of non anemic vitamin B12 deficiency     s/p B12 shots, now on oral supplementation (10/2011)  . Vitamin D deficiency   . Weight loss   . Allergic rhinitis   . Cervical spondylosis 05/2010    s/p surgery  . DDD (degenerative disc disease)   . Lumbar herniated disc   . Spinal stenosis     released from [place].  Pain meds prescribed by Danise Mina - received percocets from Dr. Erling Cruz 07/2012  . AAA (abdominal aortic aneurysm) 09/2012    limited study, stable. rec rpt with other modality (consider rpt 1 yr)    Past Surgical History  Procedure Laterality Date  . Tonsillectomy and adenoidectomy  1972  . Lumbar disc surgery      L5-S1  .  Cervical discectomy  01/2010    Anterior C4-7, cervical cord decompression and B foraminotomy ([place])(metal plate)  . Lumbar laminectomy  12/11    L2-5laminectomy/foraminotomy for stenosis ([place])  . Horizon study stenting x4  2000, 2005    in the RCA with kissing balloon angioplasty, of the PDA ostium-5 stents total per patient  . Cardiac catheterization  03/30/05    Left, Left ventriculography, coronary angiography, intravascular [place] of RCA  . Myelogram      L5-S1 and L1-2 spondylosis  . Scrotal US  07/2010    hydradenitis - testes WNL, small R epididymal cyst, small L varicocele, no sinus tract mention  . Colonoscopy  05/06/2007    normal, small int hemorrhoids rpt 5 yrs due to fmhx    History   Social History  . Marital Status: Married    Spouse Name: [name]    Number of Children: 0  . Years of Education: N/A   Occupational History  . Dental technician implants, crown and bridge-now disability 2006    Social History Main Topics  . Smoking status: Current Every Day Smoker -- 0.50 packs/day for 43 years    Types: Cigarettes, Pipe  . Smokeless tobacco: Never Used  . Alcohol Use: No  . Drug Use: No  . Sexually Active: Not on file   Other Topics Concern  . Not on file   Social History Narrative   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 x 5 years   caffeine: 2 cups coffee, 2 cups soda   Occupation: Neurosurgeon implants, crowne and bridge, now disability 2006   Lives with wife, 1 dog, no children    ROS: no fevers or chills, productive cough, hemoptysis, dysphasia, odynophagia, melena, hematochezia, dysuria, hematuria, rash, seizure activity, orthopnea, PND, pedal edema, claudication. Remaining systems are negative.  Physical Exam: Well-developed obese in no acute distress.  Skin is warm and dry.  HEENT is normal.  Neck is supple.  Chest is clear to auscultation with normal expansion.  Cardiovascular exam is regular rate and rhythm.  Abdominal exam nontender or  distended. No masses palpated. Extremities show no edema. neuro grossly intact  ECG sinus rhythm with occasional PVC. Normal axis. Nonspecific ST changes. CRO prior inferior infarct.

## 2012-10-10 NOTE — Assessment & Plan Note (Signed)
Patient counseled on discontinuing. 

## 2012-10-10 NOTE — Patient Instructions (Addendum)
Your physician wants you to follow-up in: [place] will receive a reminder letter in the mail two months in advance. If you don't receive a letter, please call our office to schedule the follow-up appointment.   Your physician has requested that you have a lexiscan myoview. For further information please visit [email or web address]. Please follow instruction sheet, as given.   START LOSARTAN 25 MG ONCE DAILY  Your physician recommends that you return for lab work in: [place]

## 2012-10-10 NOTE — Assessment & Plan Note (Signed)
Intolerant to statins. 

## 2012-10-10 NOTE — Assessment & Plan Note (Addendum)
Continue beta blocker. Add Cozaar 25 mg daily. Check potassium and renal function in one week.

## 2012-10-10 NOTE — Assessment & Plan Note (Signed)
Continue aspirin. Intolerant to statins. Schedule followup Myoview.

## 2012-10-10 NOTE — Assessment & Plan Note (Signed)
Continue present blood pressure medications. 

## 2012-10-11 ENCOUNTER — Ambulatory Visit (INDEPENDENT_AMBULATORY_CARE_PROVIDER_SITE_OTHER): Payer: Medicare PPO | Admitting: Family Medicine

## 2012-10-11 ENCOUNTER — Encounter: Payer: Self-pay | Admitting: Family Medicine

## 2012-10-11 ENCOUNTER — Telehealth: Payer: Self-pay | Admitting: Diagnostic Neuroimaging

## 2012-10-11 VITALS — BP 136/90 | HR 80 | Temp 97.7°F | Wt 289.0 lb

## 2012-10-11 DIAGNOSIS — I10 Essential (primary) hypertension: Secondary | ICD-10-CM

## 2012-10-11 DIAGNOSIS — M25551 Pain in right hip: Secondary | ICD-10-CM

## 2012-10-11 DIAGNOSIS — F17200 Nicotine dependence, unspecified, uncomplicated: Secondary | ICD-10-CM

## 2012-10-11 DIAGNOSIS — J449 Chronic obstructive pulmonary disease, unspecified: Secondary | ICD-10-CM

## 2012-10-11 DIAGNOSIS — I714 Abdominal aortic aneurysm, without rupture: Secondary | ICD-10-CM

## 2012-10-11 DIAGNOSIS — M25559 Pain in unspecified hip: Secondary | ICD-10-CM

## 2012-10-11 MED ORDER — ALBUTEROL SULFATE HFA 108 (90 BASE) MCG/ACT IN AERS: 2.0000 | INHALATION_SPRAY | Freq: Four times a day (QID) | RESPIRATORY_TRACT | Status: DC | PRN

## 2012-10-11 NOTE — Assessment & Plan Note (Signed)
Sent in albuterol to use prn.

## 2012-10-11 NOTE — Assessment & Plan Note (Signed)
Reviewed imaging study.  Rpt study due 09/2013

## 2012-10-11 NOTE — Assessment & Plan Note (Signed)
Again encouraged cessation and discussed different smoking cessation methods - sample of nicotrol provided today.

## 2012-10-11 NOTE — Progress Notes (Signed)
  Subjective:    Patient ID: [name], male    DOB: [birth year], 57 y.o.   MRN: [id number]  HPI CC: follow up  Smoking - 1/2 - 1 ppd.  Has tried chantix and wellbutrin.  Wonders about NRT  Saw cards yesterday - pending rpt stress test for next week.  AAA - recent abd US showing 3.3cm aneurysm.  Rpt due in 1 year - either CT or MRI   Continued R hip pain - last visit evaluation more consistent with R sciatica  Wt Readings from Last 3 Encounters:  10/11/12 289 lb (131.09 kg)  10/10/12 292 lb (132.45 kg)  08/15/12 290 lb 4 oz (131.657 kg)    Past Medical History  Diagnosis Date  . CAD (coronary artery disease)     x3 with stents last 2005, EF 40%  . Cardiomyopathy   . 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 COPD (chronic obstructive pulmonary disease) 10/2011    minimal by PFTs  . T2DM (type 2 diabetes mellitus)   . Disturbances of sensation of smell and taste   . Hepatic steatosis   . Gout   . Hidradenitis suppurativa 2011    followd by Lyndle Herrlich - daily bactrim, s/p intralesional steroid injection 10/2010  . HLD (hyperlipidemia)   . HTN (hypertension)   . History of MI (myocardial infarction)   . Obesity   . OSA (obstructive sleep apnea)   . Tobacco use disorder     cigar  . History of non anemic vitamin B12 deficiency     s/p B12 shots, now on oral supplementation (10/2011)  . Vitamin D deficiency   . Weight loss   . Allergic rhinitis   . Cervical spondylosis 05/2010    s/p surgery  . DDD (degenerative disc disease)   . Lumbar herniated disc   . Spinal stenosis     released from [place].  Pain meds prescribed by Danise Mina - received percocets from Dr. Erling Cruz 07/2012  . AAA (abdominal aortic aneurysm) 09/2012    limited study, stable. rec rpt with other modality (consider rpt 1 yr)     Review of Systems Per HPI    Objective:   Physical Exam  Nursing note and vitals reviewed. Constitutional: He appears well-developed and well-nourished.  No distress.  HENT:  Head: Normocephalic and atraumatic.  Mouth/Throat: Oropharynx is clear and moist. No oropharyngeal exudate.  Eyes: Conjunctivae and EOM are normal. Pupils are equal, round, and reactive to light.  Cardiovascular: Normal rate, regular rhythm, normal heart sounds and intact distal pulses.   No murmur heard. Pulmonary/Chest: Effort normal. No respiratory distress. He has wheezes (mild end exp diffusely). He has no rales.  Musculoskeletal: He exhibits no edema.       Assessment & Plan:

## 2012-10-11 NOTE — Telephone Encounter (Signed)
plz phone in. 

## 2012-10-11 NOTE — Telephone Encounter (Signed)
Rx called in as directed.   

## 2012-10-11 NOTE — Patient Instructions (Addendum)
Call Dr. Tressia Danas office to follow up on referral to ortho - if they don't make referral to medicare doctor, let me know to make referral. We've refilled vicodin and valium. Nicotine inhaler samples today - work on quitting smoking!  Look into [email or web address]. Return to see me in 6 months for follow up.

## 2012-10-11 NOTE — Assessment & Plan Note (Signed)
anticipate R leg sciatica.  Advised to call Dr. Tressia Danas office who started referral - if trouble getting through to call me and we will place referral.

## 2012-10-11 NOTE — Assessment & Plan Note (Signed)
Chronic, stable. Recently started losartan 108m. Continue toprol xl. BP Readings from Last 3 Encounters:  10/11/12 136/90  10/10/12 120/80  08/15/12 148/92

## 2012-10-15 ENCOUNTER — Telehealth: Payer: Self-pay | Admitting: Cardiology

## 2012-10-15 NOTE — Telephone Encounter (Signed)
New PRoblem:    Patient's wife called in returning a call.  Please call back.

## 2012-10-15 NOTE — Telephone Encounter (Signed)
Spoke with pt wife, not sure who called but she was transferred to nuclear to discuss stress test scheduled for Thursday.

## 2012-10-17 ENCOUNTER — Other Ambulatory Visit: Payer: Medicare PPO

## 2012-10-17 ENCOUNTER — Encounter (HOSPITAL_COMMUNITY): Payer: Medicare PPO

## 2012-10-21 ENCOUNTER — Telehealth: Payer: Self-pay | Admitting: Cardiology

## 2012-10-21 NOTE — Telephone Encounter (Signed)
LMTCB

## 2012-10-21 NOTE — Telephone Encounter (Signed)
New Prob    Pt currently taking LOSARTAN 25 mg. Pt has been coughing (dry) and has had a severe headache for a few days. Wife is concerned and would like to speak to nurse,.

## 2012-10-23 ENCOUNTER — Other Ambulatory Visit (INDEPENDENT_AMBULATORY_CARE_PROVIDER_SITE_OTHER): Payer: Medicare PPO

## 2012-10-23 ENCOUNTER — Ambulatory Visit (HOSPITAL_COMMUNITY): Payer: Medicare PPO | Attending: Cardiology | Admitting: Radiology

## 2012-10-23 VITALS — BP 116/68 | Ht 67.0 in | Wt 296.0 lb

## 2012-10-23 DIAGNOSIS — J4489 Other specified chronic obstructive pulmonary disease: Secondary | ICD-10-CM | POA: Insufficient documentation

## 2012-10-23 DIAGNOSIS — I714 Abdominal aortic aneurysm, without rupture, unspecified: Secondary | ICD-10-CM | POA: Insufficient documentation

## 2012-10-23 DIAGNOSIS — J449 Chronic obstructive pulmonary disease, unspecified: Secondary | ICD-10-CM | POA: Insufficient documentation

## 2012-10-23 DIAGNOSIS — R0609 Other forms of dyspnea: Secondary | ICD-10-CM | POA: Insufficient documentation

## 2012-10-23 DIAGNOSIS — F17200 Nicotine dependence, unspecified, uncomplicated: Secondary | ICD-10-CM

## 2012-10-23 DIAGNOSIS — R0989 Other specified symptoms and signs involving the circulatory and respiratory systems: Secondary | ICD-10-CM | POA: Insufficient documentation

## 2012-10-23 DIAGNOSIS — I2510 Atherosclerotic heart disease of native coronary artery without angina pectoris: Secondary | ICD-10-CM

## 2012-10-23 DIAGNOSIS — I10 Essential (primary) hypertension: Secondary | ICD-10-CM

## 2012-10-23 DIAGNOSIS — E785 Hyperlipidemia, unspecified: Secondary | ICD-10-CM | POA: Insufficient documentation

## 2012-10-23 DIAGNOSIS — E119 Type 2 diabetes mellitus without complications: Secondary | ICD-10-CM | POA: Insufficient documentation

## 2012-10-23 DIAGNOSIS — I428 Other cardiomyopathies: Secondary | ICD-10-CM

## 2012-10-23 DIAGNOSIS — Z8249 Family history of ischemic heart disease and other diseases of the circulatory system: Secondary | ICD-10-CM | POA: Insufficient documentation

## 2012-10-23 DIAGNOSIS — I252 Old myocardial infarction: Secondary | ICD-10-CM | POA: Insufficient documentation

## 2012-10-23 DIAGNOSIS — R0602 Shortness of breath: Secondary | ICD-10-CM | POA: Insufficient documentation

## 2012-10-23 DIAGNOSIS — R079 Chest pain, unspecified: Secondary | ICD-10-CM | POA: Insufficient documentation

## 2012-10-23 LAB — BASIC METABOLIC PANEL
BUN: 15 mg/dL (ref 6–23)
CO2: 28 mEq/L (ref 19–32)
Chloride: 104 mEq/L (ref 96–112)
GFR: 97.2 mL/min (ref >60.00)
Glucose, Bld: 103 mg/dL — ABNORMAL HIGH (ref 70–99)
Potassium: 4.4 mEq/L (ref 3.5–5.1)
Sodium: 137 mEq/L (ref 135–145)

## 2012-10-23 MED ORDER — REGADENOSON 0.4 MG/5ML IV SOLN
0.4000 mg | Freq: Once | INTRAVENOUS | Status: AC
  Administered 2012-10-23: 0.4 mg via INTRAVENOUS

## 2012-10-23 MED ORDER — TECHNETIUM TC 99M SESTAMIBI GENERIC - CARDIOLITE
33.0000 | Freq: Once | INTRAVENOUS | Status: AC | PRN
  Administered 2012-10-23: 33 via INTRAVENOUS

## 2012-10-23 NOTE — Telephone Encounter (Signed)
Patient never called back, was here this am for nuclear study. Will forward to Luisa Dago RN

## 2012-10-23 NOTE — Progress Notes (Signed)
[facility] [address] [place], [place] [address] [phone]    Cardiology Nuclear Med Study  [name] is a 57 y.o. male     MRN : [id number]     DOB: 01-14-1956  Procedure Date: 10/23/2012  Nuclear Med Background Indication for Stress Test:  Evaluation for Ischemia and Stent Patency History:  COPD and Cardiomyopathy,2005-MI-IWMI-Stents-RCA x4, 10/06 Heart Cath: EF: 40% pattnet stents RCA, ECHO: EF mildly decreased 10/2009 MPS: inferior infarct (-) ischemia EF: EF 44%, 4/14 AAA 3x3 cm Cardiac Risk Factors: Family History - CAD, Hypertension, Lipids, NIDDM and Smoker  Symptoms:  Chest Pain, DOE and SOB   Nuclear Pre-Procedure Caffeine/Decaff Intake:  None NPO After: 8:00pm   Lungs:  clear O2 Sat: 95% on room air. IV 0.9% NS with Angio Cath:  20g  IV Site: R Hand  IV Started by:  Crissie Figures, RN  Chest Size (in):  50 Cup Size: n/a  Height: 5' 7"  (1.702 m)  Weight:  296 lb (134.265 kg)  BMI:  Body mass index is 46.35 kg/(m^2). Tech Comments:  No Toprol 24 hours    Nuclear Med Study 1 or 2 day study: 2 day  Stress Test Type:  Carlton Adam  Reading MD: Dorris Carnes, MD  Order Authorizing Provider:  B.Crenshaw MD  Resting Radionuclide: Technetium 60mSestamibi  Resting Radionuclide Dose: 33.0 mCi on 10/24/12   Stress Radionuclide:  Technetium 974mestamibi  Stress Radionuclide Dose: 33.0 mCi on 10/23/12           Stress Protocol Rest HR: 65 Stress HR: 77  Rest BP: 116/68 Stress BP: 155/104  Exercise Time (min): n/a METS: n/a   Predicted Max HR: 163 bpm % Max HR: 47.24 bpm Rate Pressure Product: 11935   Dose of Adenosine (mg):  n/a Dose of Lexiscan: 0.4 mg  Dose of Atropine (mg): n/a Dose of Dobutamine: n/a mcg/kg/min (at max HR)  Stress Test Technologist: SaPerrin MalteseEMT-P  Nuclear Technologist:  StCharlton AmorCNMT     Rest Procedure:  Myocardial perfusion imaging was performed at rest 45 minutes following the intravenous  administration of Technetium 9962mstamibi. Rest ECG: NSR with non-specific ST-T wave changes  Stress Procedure:  The patient received IV Lexiscan 0.4 mg over 15-seconds.  Technetium 62m19mtamibi injected at 30-seconds. This patient was sob and had a rare pvc with the Lexiscan injection. Quantitative spect images were obtained after a 45 minute delay. Stress ECG: No significant change from baseline ECG  QPS Raw Data Images:  Soft tissue (diaphragm, bowel activity, subcutaneous fat) surround heart. Stress Images:  Defect in the inferior wall (base, mid, minimally distal).  Otherwise normal perfusion. Rest Images:  Comparison with the stress images reveals no significant change. Subtraction (SDS):  No evidence of ischemia. Transient Ischemic Dilatation (Normal <1.22):  1.10 Lung/Heart Ratio (Normal <0.45):  0.30  Quantitative Gated Spect Images QGS EDV:  168 ml QGS ESV:  103 ml  Impression Exercise Capacity:  Lexiscan with no exercise. BP Response:  Normal blood pressure response. Clinical Symptoms:  There is dyspnea. ECG Impression:  No significant ST segment change suggestive of ischemia. Comparison with Prior Nuclear Study:no significant change from report of 10/2009  Overall Impression:  Moderate scar in the inferior wall  Otherwise normal perfusion.    LV Ejection Fraction: 39%.  LV Wall Motion:  Inferior/inferolateral hypokinesis.  PaulDorris Carnes

## 2012-10-24 ENCOUNTER — Ambulatory Visit (HOSPITAL_COMMUNITY): Payer: Medicare PPO

## 2012-10-24 DIAGNOSIS — R0989 Other specified symptoms and signs involving the circulatory and respiratory systems: Secondary | ICD-10-CM

## 2012-10-24 MED ORDER — TECHNETIUM TC 99M SESTAMIBI GENERIC - CARDIOLITE
33.0000 | Freq: Once | INTRAVENOUS | Status: AC | PRN
  Administered 2012-10-24: 33 via INTRAVENOUS

## 2012-10-25 NOTE — Telephone Encounter (Signed)
Spoke with pt, aware of nuclear results

## 2012-11-06 ENCOUNTER — Other Ambulatory Visit: Payer: Self-pay | Admitting: Family Medicine

## 2012-11-06 NOTE — Telephone Encounter (Signed)
Rx called in as directed.   

## 2012-11-06 NOTE — Telephone Encounter (Signed)
Plz phone in.

## 2012-11-08 ENCOUNTER — Other Ambulatory Visit: Payer: Self-pay | Admitting: Family Medicine

## 2012-11-11 ENCOUNTER — Telehealth: Payer: Self-pay | Admitting: Family Medicine

## 2012-11-11 DIAGNOSIS — E669 Obesity, unspecified: Secondary | ICD-10-CM

## 2012-11-11 DIAGNOSIS — G600 Hereditary motor and sensory neuropathy: Secondary | ICD-10-CM

## 2012-11-11 DIAGNOSIS — M25551 Pain in right hip: Secondary | ICD-10-CM

## 2012-11-11 MED ORDER — FEBUXOSTAT 40 MG PO TABS: 40.0000 mg | ORAL_TABLET | Freq: Every day | ORAL | Status: DC

## 2012-11-11 NOTE — Telephone Encounter (Signed)
[facility]

## 2012-11-11 NOTE — Telephone Encounter (Signed)
Caller: [name]/Spouse; Phone: [phone]; Reason for Call: Spouse states she was advised, per Dr.  Danise Mina to call regarding a referral to an Orthopedic Surgeon.  States above was discussed at last office visit.  Spouse can be reached at [phone].   Spouse is also requesting a refill for Uloric 72m.  Daily.  States patient usually receives the Rx from [facility]  Spouse states patient is out of medication and is requesting a Rx for Uloric 43m  Daily; one month supply[phone] Spouse can be reached at [phone] Spouse states there has been no change in patient status.

## 2012-11-12 NOTE — Telephone Encounter (Signed)
Have placed referral and routed to [facility].

## 2012-11-12 NOTE — Telephone Encounter (Signed)
Patient needs referral to ortho surgeon. Has not had any luck with neurology making referral and you said if they had no luck, you would make the referral. They know Dr. Alvan Dame doesn't take his insurance.

## 2012-11-13 ENCOUNTER — Telehealth: Payer: Self-pay

## 2012-11-13 NOTE — Telephone Encounter (Signed)
plz check with patient to see if there's any reason he's not able to take allopurinol for gout.  Thanks.

## 2012-11-13 NOTE — Telephone Encounter (Signed)
Prior auth for Uloric in Dr Danise Mina in box for completion.

## 2012-11-14 ENCOUNTER — Telehealth: Payer: Self-pay | Admitting: *Deleted

## 2012-11-14 NOTE — Telephone Encounter (Signed)
Will submit PA form, but insurance will likely not cover. Placed in my out box

## 2012-11-14 NOTE — Telephone Encounter (Signed)
Spoke with patient.  He states he can take allopurinol, but the uloric works better for him.

## 2012-11-14 NOTE — Telephone Encounter (Signed)
Patient just wanted to know his reason for his last office visit for Social Security forms. Says he will call to schedule appt with Dr. Leta Baptist (Dr. Erling Cruz reassignment)  when needed. Agreed.

## 2012-11-15 MED ORDER — ALLOPURINOL 300 MG PO TABS: 300.0000 mg | ORAL_TABLET | Freq: Every day | ORAL | Status: DC

## 2012-11-15 NOTE — Addendum Note (Signed)
Addended by: Ria Bush on: 11/15/2012 01:25 PM   Modules accepted: Orders

## 2012-11-15 NOTE — Telephone Encounter (Signed)
[employer] sent denial letter; placed in Dr Synthia Innocent in box.

## 2012-11-15 NOTE — Telephone Encounter (Addendum)
plz notify uloric has been denied - I have sent in allopurinol for pt to start taking instead of uloric.  Take 367m daily, watch for gout flare when starting this medicine.

## 2012-11-18 NOTE — Telephone Encounter (Signed)
Patient's wife notified and will call if he has gout flare. She said FYI-colchicine caused GI issues for him.

## 2012-11-26 ENCOUNTER — Telehealth: Payer: Self-pay

## 2012-11-26 NOTE — Telephone Encounter (Signed)
Pt said pt is going 11/28/12 to Dr French Ana for painful injection in hip by a machine.pt said he does not want hip replacement and Dr French Ana has a machine that will give shot in pt's hip and will be very painful. Pt said needs stronger med (percocet) than Hydrocodone for pain after has injection. Pt said Dr French Ana could not give pain med suggested pt contact PCP. Pt is to go to family reunion this weekend( honoring family members who died this past year) and needs strong pain med after gets this injection.pt request cb.

## 2012-11-27 ENCOUNTER — Encounter: Payer: Self-pay | Admitting: Family Medicine

## 2012-11-27 MED ORDER — OXYCODONE-ACETAMINOPHEN 5-325 MG PO TABS: 1.0000 | ORAL_TABLET | Freq: Three times a day (TID) | ORAL | Status: DC | PRN

## 2012-11-27 NOTE — Telephone Encounter (Signed)
Will do short course of percocets.  Printed and placed in [place] box.

## 2012-11-27 NOTE — Telephone Encounter (Signed)
Patient's wife notified and Rx placed up front for pick up.

## 2012-12-05 ENCOUNTER — Other Ambulatory Visit: Payer: Self-pay | Admitting: Family Medicine

## 2012-12-05 NOTE — Telephone Encounter (Signed)
Ok to refill 

## 2012-12-05 NOTE — Telephone Encounter (Signed)
Rx's called in as directed.

## 2012-12-05 NOTE — Telephone Encounter (Signed)
plz phone in. 

## 2012-12-12 ENCOUNTER — Other Ambulatory Visit: Payer: Self-pay

## 2012-12-13 ENCOUNTER — Encounter: Payer: Self-pay | Admitting: Family Medicine

## 2012-12-18 ENCOUNTER — Telehealth: Payer: Self-pay | Admitting: *Deleted

## 2012-12-18 DIAGNOSIS — E669 Obesity, unspecified: Secondary | ICD-10-CM

## 2012-12-18 DIAGNOSIS — M25551 Pain in right hip: Secondary | ICD-10-CM

## 2012-12-18 DIAGNOSIS — G600 Hereditary motor and sensory neuropathy: Secondary | ICD-10-CM

## 2012-12-18 MED ORDER — FEBUXOSTAT 40 MG PO TABS: 40.0000 mg | ORAL_TABLET | Freq: Every day | ORAL | Status: DC

## 2012-12-18 NOTE — Telephone Encounter (Signed)
Patient aware.

## 2012-12-18 NOTE — Telephone Encounter (Signed)
Patient called and said that the Allopurinol is causing him to feel nauseated. He wants to go back on the uloric. He says he is willing to pay out of pocket for it instead of feeling the way he does. He asks that it be sent back to the pharmacy.

## 2012-12-18 NOTE — Telephone Encounter (Signed)
plz notify I have sent in uloric to pharmacy.  May need to fill out PA.

## 2012-12-29 ENCOUNTER — Encounter: Payer: Self-pay | Admitting: Family Medicine

## 2012-12-31 ENCOUNTER — Telehealth: Payer: Self-pay | Admitting: Diagnostic Neuroimaging

## 2013-01-06 ENCOUNTER — Other Ambulatory Visit: Payer: Self-pay | Admitting: Family Medicine

## 2013-01-06 NOTE — Telephone Encounter (Signed)
Rx's called in as directed.

## 2013-01-06 NOTE — Telephone Encounter (Signed)
Ok to refill 

## 2013-01-06 NOTE — Telephone Encounter (Signed)
plz phone in. 

## 2013-01-16 ENCOUNTER — Other Ambulatory Visit: Payer: Self-pay | Admitting: Family Medicine

## 2013-01-23 ENCOUNTER — Encounter: Payer: Self-pay | Admitting: Family Medicine

## 2013-01-23 ENCOUNTER — Ambulatory Visit (INDEPENDENT_AMBULATORY_CARE_PROVIDER_SITE_OTHER): Payer: Medicare PPO | Admitting: Family Medicine

## 2013-01-23 VITALS — BP 118/76 | HR 81 | Temp 97.8°F | Wt 302.0 lb

## 2013-01-23 DIAGNOSIS — M549 Dorsalgia, unspecified: Secondary | ICD-10-CM

## 2013-01-23 DIAGNOSIS — M169 Osteoarthritis of hip, unspecified: Secondary | ICD-10-CM

## 2013-01-23 DIAGNOSIS — M1611 Unilateral primary osteoarthritis, right hip: Secondary | ICD-10-CM

## 2013-01-23 DIAGNOSIS — G600 Hereditary motor and sensory neuropathy: Secondary | ICD-10-CM

## 2013-01-23 DIAGNOSIS — G8929 Other chronic pain: Secondary | ICD-10-CM

## 2013-01-23 MED ORDER — OXYCODONE-ACETAMINOPHEN 7.5-325 MG PO TABS: 1.0000 | ORAL_TABLET | Freq: Three times a day (TID) | ORAL | Status: DC | PRN

## 2013-01-23 NOTE — Assessment & Plan Note (Signed)
See above re: switch from hydrocodone to oxycodone to be made 02/2013.

## 2013-01-23 NOTE — Assessment & Plan Note (Signed)
I have provided patient with written script for rolling walker and hospital bed 2/2 worsening osteoarthritis of R hip leading to increased difficulty in getting out of bed 2/2 pain and weakness (has had falls) and increased difficulty in ambulation 2/2 CMT disease. To establish with Dr Leta Baptist next week.

## 2013-01-23 NOTE — Assessment & Plan Note (Addendum)
Reviewed options with patient.  Has not responded as well as had hoped for after steroid injection. Pt and ortho hesitant for hip replacement 2/2 increased risk, although this is definitely an option. Pt resistant to see pain clinic 2/2 financial cost. For now will increase narcotic treatment to percocets 7.5/325 mg TID prn pain - pt aware of sedation risk. I provided pt with written script for percocets 7.5/325mg TID PRN pain #90 to fill on or after 02/04/2013. RTC 4-6 wks for f/u pain management.

## 2013-01-23 NOTE — Progress Notes (Signed)
  Subjective:    Patient ID: [name], male    DOB: 12/10/1955, 57 y.o.   MRN: [id number]  HPI CC: discuss mobility  57 yo with h/o CAD s/p stents, charcot marie tooth disease type 2, minimal COPD continued smoker, T2DM, gout, hidradenitis, HTN, HLD, spinal stenosis, AAA, R hip osteoarthritis and morbid obesity.    Uncontrolled chronic pain - currently stemming from R hip arthritis, but also lower and upper back pain (s/p multiple surgeries) and shoulder pain.  R hip OA - Saw [name] at [facility].  Had hip intraarticular injection 12/19/2012 with resolution of pain for only 2 weeks, now pain has returned, severe.  Only able to receive intraarticular steroid injection every 3 months.  If not better, next option would be surgery but given his comorbidites would be very high risk.  Pt and surgeon are hesitant for this. He is not currently interested in motorized wheelchair because of concern for loss of function of what muscle strength he has with disuse.  Hydrocodone not controlling pain. Prior percocet prescription caused oversedation, however interested in another trial of this.  Trouble getting out of bed 2/2 poor bed support.  Worst pain at night time.  Pain with ambulation.  Told to use walker but has not wanted to use until now - due to worsening pain.  Wt Readings from Last 3 Encounters:  01/23/13 302 lb (136.986 kg)  10/23/12 296 lb (134.265 kg)  10/11/12 289 lb (131.09 kg)  Body mass index is 47.29 kg/(m^2).   Past Medical History  Diagnosis Date  . CAD (coronary artery disease)     x3 with stents last 2005, EF 40%  . Cardiomyopathy   . Charcot-Marie-Tooth disease     [name], type 2 per pt  . COPD (chronic obstructive pulmonary disease) 10/2011    minimal by PFTs  . T2DM (type 2 diabetes mellitus)   . Disturbances of sensation of smell and taste   . Hepatic steatosis   . Gout   . Hidradenitis suppurativa 2011    followd by Lyndle Herrlich - daily bactrim, s/p  intralesional steroid injection 10/2010  . HLD (hyperlipidemia)   . HTN (hypertension)   . History of MI (myocardial infarction)   . Obesity   . OSA (obstructive sleep apnea)   . Tobacco use disorder     cigar  . History of non anemic vitamin B12 deficiency     s/p B12 shots, now on oral supplementation (10/2011)  . Vitamin D deficiency   . Weight loss   . Allergic rhinitis   . Cervical spondylosis 05/2010    s/p surgery  . DDD (degenerative disc disease)   . Lumbar herniated disc   . Spinal stenosis     released from [place].  Pain meds prescribed by Danise Mina - received percocets from [name] 07/2012  . AAA (abdominal aortic aneurysm) 09/2012    limited study, stable. rec rpt with other modality (consider rpt 1 yr)  . Hip osteoarthritis     s/p intraarticular steroid shot (12/2012) (Ibazebo/Caffrey)     Review of Systems Per HPI    Objective:   Physical Exam  Nursing note and vitals reviewed. Constitutional: He appears well-developed and well-nourished. No distress.  obese  Neurological:  Ambulates with antalgic gait and cane       Assessment & Plan:

## 2013-01-23 NOTE — Patient Instructions (Signed)
Prescription provided for percocets to try next month.   I've provided prescription for rolling walker and hospital bed.  This will hopefully provide relief with sleeping at night. Return to see me in 1 month for follow up.

## 2013-01-24 ENCOUNTER — Telehealth: Payer: Self-pay

## 2013-01-24 NOTE — Telephone Encounter (Signed)
Pt requesting 01/23/13 office notes faxed to [phone] for face to face visit for hospital bed and walker. 01/23/13 notes faxed as requested.

## 2013-01-24 NOTE — Telephone Encounter (Signed)
Pt called back the fax # given earlier is for [employer]; phone # is [phone] if needed.

## 2013-01-28 ENCOUNTER — Ambulatory Visit: Payer: Self-pay | Admitting: Diagnostic Neuroimaging

## 2013-01-30 ENCOUNTER — Other Ambulatory Visit: Payer: Self-pay | Admitting: Family Medicine

## 2013-01-30 ENCOUNTER — Encounter: Payer: Self-pay | Admitting: Diagnostic Neuroimaging

## 2013-01-30 ENCOUNTER — Ambulatory Visit (INDEPENDENT_AMBULATORY_CARE_PROVIDER_SITE_OTHER): Payer: Medicare PPO | Admitting: Diagnostic Neuroimaging

## 2013-01-30 VITALS — BP 108/71 | HR 78 | Temp 98.2°F | Ht 67.0 in | Wt 312.0 lb

## 2013-01-30 DIAGNOSIS — G600 Hereditary motor and sensory neuropathy: Secondary | ICD-10-CM

## 2013-01-30 DIAGNOSIS — G8929 Other chronic pain: Secondary | ICD-10-CM

## 2013-01-30 DIAGNOSIS — M25559 Pain in unspecified hip: Secondary | ICD-10-CM

## 2013-01-30 NOTE — Progress Notes (Signed)
[facility]  PATIENT: Miguel WATERSON DOB: 03/20/1956  REFERRING CLINICIAN:  HISTORY FROM: patient and brother REASON FOR VISIT: follow up (Dr. Erling Cruz transfer)   HISTORICAL  CHIEF COMPLAINT:  Chief Complaint  Patient presents with  . Follow-up    neck and back pain, Charcot Marie Tooth    HISTORY OF PRESENT ILLNESS:    UPDATE 01/30/13: Since last visit patient has had steroid injection to the right hip which gave him 4 weeks of pain relief. However right hip pain has returned. He was told he may need right hip replacement surgery, but he is not a good surgical candidate at this time because of his other comorbid medical conditions. Patient's weight continues to rise. He is increased by 20 pounds over the past 6 months. He's having difficulty time increasing physical activity do to his chronic pain. Currently he is on oxycodone/Tylenol 7.5/325, 3 times per day for pain. He has been recommended to see a pain management clinic multiple times in the past but has been resistant do to financial concerns.  PRIOR HPI (07/17/12, Dr. Erling Cruz): 57 year old right-handed ambidextrous white married male with Charcot-Marie-Tooth disease documented by muscle biopsy for evaluation of high arches at age 33. His mother had this disorder and lived until age 57, when she died from colon cancer. He was able to participate in sports as a teenager including football and was the state champion  in wrestling. He has peroneal atrophy with weakness distally in his feet and legs, muscle spasms in his hands and legs, and gait disorder with multiple falls, frequently tripping and falling forward. He does not use a cane or a walker.He has diabetes mellitus for 5 years and is overweight. He had lumbar spine surgery, the first operation in 1986 by Dr. Ames Dura for pain in his back radiating to his left leg, the second operation in 1999 by Dr. Stann Mainland.Botero for pain in his back radiating into his left  leg, and  a third operation  05/18/2010 by Dr. Joya Salm with bilateral L2, L3, L4, and L5 laminectomies and foraminotomies. He also had anterior cervical discectomy C4-5, C5-6, and C6-7 with decompression of spinal cord and bilateral foraminotomies and removal of posterior calcified ligament worse at C4-5 01/07/2010 by Dr. Joya Salm. After the cervical surgery he lost 50 pounds. He noticed that food smelled differently. His anosmia  remains the same  and his weight is about the same. He has pain all of the time in his lower back, legs, and arms, worse in his right arm and  right hip. He describes episodes in which his right arm becomes weak. He denies bowel or bladder incontinence or Lhermitte's sign. He has numbness in his hands. His pain is 7-1/2 to8/10. Lorcet made him very sleepy. He is on nortriptyline 50 mg per day, hydrocodone/acetaminophen 10/325  one tablet 4 times per day, and Cymbalta 60 mg (started 11/2011) with definite improvement in pain. He has hidradenitis requiring Sulfamethoxazole- trimethoprim one tablet 4 times daily His mother and niece have the same condition.  MRI of his thoracic spine 06/10/1008 showed significant DJD, most notable at C6-7 with focal central disc bulge resulting in mild displacement of the cord and at T9-T10, a large subligamentous herniated disc fragment that displaced the cord. He notices weakness in his forearms,  cramps in his forearms, and posteriorly in his left leg behind the knee. When he used a weed eater,his hands would shake from weakness. At times he has difficulty picking up his left  leg and has to use his arm to pick up the leg to get into the car.. He has numbness in both hands and both feet  but it is less in his feet then it is in his hands. Dr.Botero has recommended a pain clinic. The patient's pain is lower back,right shoulder, and in the last 4 months, his right hip.[name] He requires assistance to dress himself, bathe himself, and take care of his toileting needs. He  can feed himself and drive a  car. His wife does the finances. He gives himself his medications. He can cook, shop, make up  the bed, and washes the dishes. He denies bowel or bladder incontinence.. Blood glucoses have been ranging proximally 118. He fell out of bed one month ago and couldn't get up. At times his right arm We'll be very weak. Is not using CPAP. Dr. Darlin Priestly discontinued. Dr. [name] said "neck is not going to get any better". Last x-rays look good. Patient's had dysphagia since A./for/2013 when he was intubated. He asked about restarting oxycodone instead of Vicodin. He has burning in his right thigh.  REVIEW OF SYSTEMS: Full 14 system review of systems performed and notable only for memory loss headache numbness weakness trouble swallowing dizziness tremor insomnia not to sleep decreased energy change in appetite skin sensitivity skin infections aching muscles cramps joint pain flushing wheezing blurred vision fatigue trouble swallowing rash skin moles.  ALLERGIES: Allergies  Allergen Reactions  . Statins Shortness Of Breath    Cough, trouble breathing  . Allopurinol Nausea Only  . Losartan     HOME MEDICATIONS: Prior to Admission medications   Medication Sig Start Date End Date Taking? Authorizing Provider  albuterol (PROVENTIL HFA;VENTOLIN HFA) 108 (90 BASE) MCG/ACT inhaler Inhale 2 puffs into the lungs every 6 (six) hours as needed for wheezing. 10/11/12  Yes Ria Bush, MD  aspirin 325 MG tablet Take 325 mg by mouth daily.     Yes Historical Provider, MD  Cholecalciferol (VITAMIN D) 2000 UNITS CAPS Take 1 capsule (2,000 Units total) by mouth daily. 08/29/10  Yes Ria Bush, MD  clindamycin (CLEOCIN T) 1 % external solution Apply topically 2 (two) times daily. 10/13/10  Yes Ria Bush, MD  diazepam (VALIUM) 10 MG tablet TAKE 1 TABLET BY MOUTH EVERY 12 HOURS 01/06/13  Yes Ria Bush, MD  doxycycline (VIBRAMYCIN) 100 MG capsule Take 100 mg by mouth 2 (two) times  daily.   Yes Historical Provider, MD  DULoxetine (CYMBALTA) 60 MG capsule Take 60 mg by mouth daily.   Yes Historical Provider, MD  erythromycin with ethanol (EMGEL) 2 % gel Apply topically Daily.  12/06/11  Yes Historical Provider, MD  febuxostat (ULORIC) 40 MG tablet Take 1 tablet (40 mg total) by mouth daily. Allopurinol intolerant 12/18/12  Yes Ria Bush, MD  fluticasone [facility]) 50 MCG/ACT nasal spray  02/09/12  Yes Ria Bush, MD  folic acid (FOLVITE) 1 MG tablet TAKE 1 TABLET BY MOUTH ONCE A DAY 01/30/13  Yes Ria Bush, MD  HYDROcodone-acetaminophen [facility]) 10-325 MG per tablet Take 1 tablet by mouth as needed. 01/06/13  Yes Historical Provider, MD  metFORMIN (GLUCOPHAGE) 500 MG tablet Take 1 tablet (500 mg total) by mouth at bedtime. 11/08/12  Yes Ria Bush, MD  metoprolol succinate (TOPROL-XL) 50 MG 24 hr tablet TAKE 1/2 TABLET EVERY DAY 09/12/12  Yes Lelon Perla, MD  nabumetone (RELAFEN) 750 MG tablet TAKE 1 TABLET (750 MG TOTAL) BY MOUTH 2 (TWO) TIMES DAILY. 01/16/13  Yes  Ria Bush, MD  nitroGLYCERIN (NITROSTAT) 0.4 MG SL tablet Place 1 tablet (0.4 mg total) under the tongue every 5 (five) minutes as needed for chest pain. 10/10/12  Yes Lelon Perla, MD  nortriptyline (PAMELOR) 50 MG capsule Take 1 capsule (50 mg total) by mouth at bedtime. 06/07/12  Yes Ria Bush, MD  vitamin B-12 (CYANOCOBALAMIN) 500 MCG tablet Take 500 mcg by mouth daily.   Yes Historical Provider, MD  oxyCODONE-acetaminophen (PERCOCET) 7.5-325 MG per tablet Take 1 tablet by mouth every 8 (eight) hours as needed for pain. 01/23/13   Ria Bush, MD   Outpatient Prescriptions Prior to Visit  Medication Sig Dispense Refill  . albuterol (PROVENTIL HFA;VENTOLIN HFA) 108 (90 BASE) MCG/ACT inhaler Inhale 2 puffs into the lungs every 6 (six) hours as needed for wheezing.  1 Inhaler  3  . aspirin 325 MG tablet Take 325 mg by mouth daily.        . Cholecalciferol (VITAMIN D) 2000 UNITS  CAPS Take 1 capsule (2,000 Units total) by mouth daily.  30 capsule  11  . clindamycin (CLEOCIN T) 1 % external solution Apply topically 2 (two) times daily.  30 mL  0  . diazepam (VALIUM) 10 MG tablet TAKE 1 TABLET BY MOUTH EVERY 12 HOURS  60 tablet  0  . doxycycline (VIBRAMYCIN) 100 MG capsule Take 100 mg by mouth 2 (two) times daily.      . DULoxetine (CYMBALTA) 60 MG capsule Take 60 mg by mouth daily.      [name] erythromycin with ethanol (EMGEL) 2 % gel Apply topically Daily.       . febuxostat (ULORIC) 40 MG tablet Take 1 tablet (40 mg total) by mouth daily. Allopurinol intolerant  30 tablet  11  . fluticasone (FLONASE) 50 MCG/ACT nasal spray       . metFORMIN (GLUCOPHAGE) 500 MG tablet Take 1 tablet (500 mg total) by mouth at bedtime.  90 tablet  3  . metoprolol succinate (TOPROL-XL) 50 MG 24 hr tablet TAKE 1/2 TABLET EVERY DAY  30 tablet  6  . nabumetone (RELAFEN) 750 MG tablet TAKE 1 TABLET (750 MG TOTAL) BY MOUTH 2 (TWO) TIMES DAILY.  60 tablet  6  . nitroGLYCERIN (NITROSTAT) 0.4 MG SL tablet Place 1 tablet (0.4 mg total) under the tongue every 5 (five) minutes as needed for chest pain.  25 tablet  12  . nortriptyline (PAMELOR) 50 MG capsule Take 1 capsule (50 mg total) by mouth at bedtime.  30 capsule  11  . vitamin B-12 (CYANOCOBALAMIN) 500 MCG tablet Take 500 mcg by mouth daily.      [name] losartan (COZAAR) 25 MG tablet Take 1 tablet (25 mg total) by mouth daily.  30 tablet  12  . oxyCODONE-acetaminophen (PERCOCET) 7.5-325 MG per tablet Take 1 tablet by mouth every 8 (eight) hours as needed for pain.  90 tablet  0   No facility-administered medications prior to visit.    PAST MEDICAL HISTORY: Past Medical History  Diagnosis Date  . CAD (coronary artery disease)     x3 with stents last 2005, EF 40%  . Cardiomyopathy   . 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 COPD (chronic obstructive pulmonary disease) 10/2011    minimal by PFTs  . T2DM (type 2 diabetes mellitus)     . Disturbances of sensation of smell and taste   . Hepatic steatosis   . Gout   . Hidradenitis  suppurativa 2011    followd by Lyndle Herrlich - daily bactrim, s/p intralesional steroid injection 10/2010  . HLD (hyperlipidemia)   . HTN (hypertension)   . History of MI (myocardial infarction)   . Obesity   . OSA (obstructive sleep apnea)   . Tobacco use disorder     cigar  . History of non anemic vitamin B12 deficiency     s/p B12 shots, now on oral supplementation (10/2011)  . Vitamin D deficiency   . Weight loss   . Allergic rhinitis   . Cervical spondylosis 05/2010    s/p surgery  . DDD (degenerative disc disease)   . Lumbar herniated disc   . Spinal stenosis     released from [place].  Pain meds prescribed by Danise Mina - received percocets from Dr. Erling Cruz 07/2012  . AAA (abdominal aortic aneurysm) 09/2012    limited study, stable. rec rpt with other modality (consider rpt 1 yr)  . Hip osteoarthritis     s/p intraarticular steroid shot (12/2012) (Ibazebo/Caffrey)    PAST SURGICAL HISTORY: Past Surgical History  Procedure Laterality Date  . Tonsillectomy and adenoidectomy  1972  . Lumbar disc surgery      L5-S1  . Cervical discectomy  01/2010    Anterior C4-7, cervical cord decompression and B foraminotomy (Botero)(metal plate)  . Lumbar laminectomy  12/11    L2-5laminectomy/foraminotomy for stenosis (Botero)  . [place] study stenting x4  2000, 2005    in the RCA with kissing balloon angioplasty, of the PDA ostium-5 stents total per patient  . Cardiac catheterization  03/30/05    Left, Left ventriculography, coronary angiography, intravascular [place] of RCA  . Myelogram      L5-S1 and L1-2 spondylosis  . Scrotal US  07/2010    hydradenitis - testes WNL, small R epididymal cyst, small L varicocele, no sinus tract mention  . Colonoscopy  05/06/2007    normal, small int hemorrhoids rpt 5 yrs due to fmhx    FAMILY HISTORY: Family History  Problem Relation Age of Onset  . Cancer Mother      colon  . Diabetes Mother   . Kidney disease Mother   . Aneurysm Mother     AAA  . Rheum arthritis Mother   . Charcot-Marie-Tooth disease Mother   . Cancer Father     skin  . Heart attack Father   . Cancer Brother     skin  . Coronary artery disease Brother   . Cancer Brother     small cell lung cancer  . Aneurysm Brother     AAA  . Rheum arthritis Sister   . Rheum arthritis Brother     SOCIAL HISTORY:  History   Social History  . Marital Status: Married    Spouse Name: [name]    Number of Children: 0  . Years of Education: College   Occupational History  . Dental technician implants, crown and bridge-now disability 2006    Social History Main Topics  . Smoking status: Current Every Day Smoker -- 0.50 packs/day for 43 years    Types: Cigarettes, Pipe  . Smokeless tobacco: Never Used  . Alcohol Use: No  . Drug Use: No  . Sexual Activity: Not on file   Other Topics Concern  . Not on file   Social History Narrative   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 x 5 years   caffeine: 2 cups coffee, 2 cups soda   Occupation: Neurosurgeon implants, crowne and bridge, now disability  2006   Lives with wife, 1 dog, no children     PHYSICAL EXAM  Filed Vitals:   01/30/13 1509  BP: 108/71  Pulse: 78  Temp: 98.2 F (36.8 C)  TempSrc: Oral  Height: 5' 7"  (1.702 m)  Weight: 312 lb (141.522 kg)    Not recorded    Body mass index is 48.85 kg/(m^2).  GENERAL EXAM: Patient is in no distress  CARDIOVASCULAR: Regular rate and rhythm, no murmurs, no carotid bruits  NEUROLOGIC: MENTAL STATUS: awake, alert, language fluent, comprehension intact, naming intact CRANIAL NERVE: no papilledema on fundoscopic exam, pupils equal and reactive to light, visual fields full to confrontation, extraocular muscles intact, no nystagmus, facial sensation and strength symmetric, uvula midline, shoulder shrug symmetric, tongue midline. MOTOR: RUE (TRICEP 4, OTHERWISE 5), LUE  5, BLE (RIGHT HF 3, LEFT HF 4, KE/KF 4, DF 3, PF 4). SENSORY: DECR PP AND TEMP IN LEGS. ABSENT VIB AT TOES AND ANKLES. COORDINATION: finger-nose-finger, fine finger movements normal REFLEXES: BUE 1, KNEES 1, ANKLES 0. GAIT/STATION: ANTALGIC GAIT.   DIAGNOSTIC DATA (LABS, IMAGING, TESTING) - I reviewed patient records, labs, notes, testing and imaging myself where available.  Lab Results  Component Value Date   WBC 10.3 05/06/2012   HGB 14.6 05/06/2012   HCT 44.3 05/06/2012   MCV 99.9 05/06/2012   PLT 196.0 05/06/2012      Component Value Date/Time   NA 137 10/23/2012 0805   K 4.4 10/23/2012 0805   CL 104 10/23/2012 0805   CO2 28 10/23/2012 0805   GLUCOSE 103* 10/23/2012 0805   BUN 15 10/23/2012 0805   CREATININE 0.9 10/23/2012 0805   CALCIUM 8.7 10/23/2012 0805   PROT 7.3 05/06/2012 0912   ALBUMIN 3.5 05/06/2012 0912   AST 22 05/06/2012 0912   ALT 18 05/06/2012 0912   ALKPHOS 77 05/06/2012 0912   BILITOT 0.8 05/06/2012 0912   GFRNONAA >60 05/12/2010 1615   GFRAA  Value: >60        The eGFR has been calculated using the MDRD equation. This calculation has not been validated in all clinical situations. eGFR's persistently <60 mL/min signify possible Chronic Kidney Disease. 05/12/2010 1615   Lab Results  Component Value Date   CHOL 192 05/06/2012   HDL 34.30* 05/06/2012   LDLCALC 68 02/02/2010   LDLDIRECT 128.0 05/06/2012   TRIG 220.0* 05/06/2012   CHOLHDL 6 05/06/2012   Lab Results  Component Value Date   HGBA1C 5.8 05/06/2012   Lab Results  Component Value Date   [id number] 621 02/14/2011   Lab Results  Component Value Date   TSH 1.18 05/06/2012     ASSESSMENT AND PLAN  57 y.o. year old male here with Charcot-Marie-Tooth neuropathy.   PROBLEM LIST:   Localized right hip pain, unlikely to be radiculopathy  Lumbar spine degenerative disease status post 3 surgical procedures  Right hand and arm weakness right shoulder pain, most likely right cervical radiculopathy,  long-standing  OBESITY (BMI 49)  Diabetes mellitus  Degenerative cervical AND lumbar spine disease  Gout  Hydradenitis  PLAN: 1. I strongly recommend patient be referred to pain management clinic for long-term management. 2. Consider bariatric surgery clinic referral, if patient is not able to reduce weight using diet and exercise 3. Follow up in neurology clinic as needed  Return if symptoms worsen or fail to improve, for return to PCP.    Penni Bombard, MD 08/10/6576, 4:69 PM Certified in Neurology, Neurophysiology and Neuroimaging  [facility]  [place] [address], [address] [place], [place] [address] [phone]

## 2013-01-30 NOTE — Patient Instructions (Signed)
I agree with referral to chronic pain management clinic for long-term narcotics, medication management and interventional pain techniques.  Please focus on gradual weight loss reduction , by focusing on gradually increasing physical activity and reducing calorie intake. You may also want to consider referral to bariatric surgery clinic.

## 2013-02-04 ENCOUNTER — Other Ambulatory Visit: Payer: Self-pay | Admitting: Family Medicine

## 2013-02-04 NOTE — Telephone Encounter (Signed)
plz phone in. 

## 2013-02-04 NOTE — Telephone Encounter (Signed)
Rx called in as directed.   

## 2013-02-04 NOTE — Telephone Encounter (Signed)
Ok to refill 

## 2013-03-04 ENCOUNTER — Ambulatory Visit: Payer: Medicare PPO | Admitting: Family Medicine

## 2013-03-04 ENCOUNTER — Encounter: Payer: Self-pay | Admitting: Cardiology

## 2013-03-04 ENCOUNTER — Other Ambulatory Visit: Payer: Self-pay

## 2013-03-04 DIAGNOSIS — Z0289 Encounter for other administrative examinations: Secondary | ICD-10-CM

## 2013-03-04 NOTE — Telephone Encounter (Signed)
Pt request rx oxycodone apap. Call when ready for pick up.

## 2013-03-05 MED ORDER — OXYCODONE-ACETAMINOPHEN 7.5-325 MG PO TABS: 1.0000 | ORAL_TABLET | Freq: Three times a day (TID) | ORAL | Status: DC | PRN

## 2013-03-05 NOTE — Telephone Encounter (Signed)
Printed and placed in [name]'s box

## 2013-03-05 NOTE — Telephone Encounter (Signed)
Patient's wife notified and Rx placed up front for pick up.

## 2013-03-06 ENCOUNTER — Other Ambulatory Visit: Payer: Self-pay | Admitting: Family Medicine

## 2013-03-06 NOTE — Telephone Encounter (Signed)
plz phone in. 

## 2013-03-06 NOTE — Telephone Encounter (Signed)
Ok to refill 

## 2013-03-06 NOTE — Telephone Encounter (Signed)
Rx called in as directed.   

## 2013-03-13 ENCOUNTER — Encounter: Payer: Self-pay | Admitting: Family Medicine

## 2013-03-13 ENCOUNTER — Ambulatory Visit (INDEPENDENT_AMBULATORY_CARE_PROVIDER_SITE_OTHER): Payer: Medicare PPO | Admitting: Family Medicine

## 2013-03-13 VITALS — BP 136/84 | HR 72 | Temp 98.4°F | Wt 307.2 lb

## 2013-03-13 DIAGNOSIS — M169 Osteoarthritis of hip, unspecified: Secondary | ICD-10-CM

## 2013-03-13 DIAGNOSIS — G600 Hereditary motor and sensory neuropathy: Secondary | ICD-10-CM

## 2013-03-13 DIAGNOSIS — M25559 Pain in unspecified hip: Secondary | ICD-10-CM

## 2013-03-13 DIAGNOSIS — E669 Obesity, unspecified: Secondary | ICD-10-CM

## 2013-03-13 DIAGNOSIS — M1611 Unilateral primary osteoarthritis, right hip: Secondary | ICD-10-CM

## 2013-03-13 DIAGNOSIS — M25551 Pain in right hip: Secondary | ICD-10-CM

## 2013-03-13 DIAGNOSIS — G8929 Other chronic pain: Secondary | ICD-10-CM

## 2013-03-13 DIAGNOSIS — E119 Type 2 diabetes mellitus without complications: Secondary | ICD-10-CM

## 2013-03-13 DIAGNOSIS — Z23 Encounter for immunization: Secondary | ICD-10-CM

## 2013-03-13 DIAGNOSIS — M549 Dorsalgia, unspecified: Secondary | ICD-10-CM

## 2013-03-13 DIAGNOSIS — I10 Essential (primary) hypertension: Secondary | ICD-10-CM

## 2013-03-13 MED ORDER — GLUCOSE BLOOD VI STRP: ORAL_STRIP | Status: DC

## 2013-03-13 MED ORDER — ONETOUCH ULTRA SYSTEM W/DEVICE KIT: 1.0000 | PACK | Freq: Once | Status: DC

## 2013-03-13 MED ORDER — FEBUXOSTAT 40 MG PO TABS: 40.0000 mg | ORAL_TABLET | Freq: Every day | ORAL | Status: DC

## 2013-03-13 NOTE — Addendum Note (Signed)
Addended by: Tammi Sou on: 03/13/2013 02:57 PM   Modules accepted: Orders

## 2013-03-13 NOTE — Patient Instructions (Signed)
Flu shot today. Foot exam today. Schedule eye exam as you're due.

## 2013-03-13 NOTE — Progress Notes (Signed)
Subjective:    Patient ID: [name], male    DOB: 15-Apr-1956, 57 y.o.   MRN: [id number]  HPI CC: not feeling well  Got stung by yellow jacket 3 d ago.  Felt ill for 3 days.  Stung left eyebrow. Feeling some better today.  Staying fatigued - even prior to sting.  Pain from severe R DJD hip - told not surgical candidate 2/2 comorbidities.  Now having R lower leg pain - denies inciting trauma or falls ([place]).  Intraarticular shot helped for 1 month.  Interested in rpt injection - will check with Dr. French Ana  DM - lost glucometer.  One touch.  Requests refill.  No recent eye exam.  No recent foot exam.  Compliant with metformin. Lab Results  Component Value Date   HGBA1C 5.8 05/06/2012     Chronic pain - percocets working better for pain, but some sedation with this.  Regardless desires to continue this med rather than hydrocodone.  Requests forms filled for patient assistance for uloric.  Past Medical History  Diagnosis Date  . CAD (coronary artery disease)     x3 with stents last 2005, EF 40%  . Cardiomyopathy   . 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 COPD (chronic obstructive pulmonary disease) 10/2011    minimal by PFTs  . T2DM (type 2 diabetes mellitus)   . Disturbances of sensation of smell and taste   . Hepatic steatosis   . Gout   . Hidradenitis suppurativa 2011    followd by Lyndle Herrlich - daily bactrim, s/p intralesional steroid injection 10/2010  . HLD (hyperlipidemia)   . HTN (hypertension)   . History of MI (myocardial infarction)   . Obesity   . OSA (obstructive sleep apnea)   . Tobacco use disorder     cigar  . History of non anemic vitamin B12 deficiency     s/p B12 shots, now on oral supplementation (10/2011)  . Vitamin D deficiency   . Weight loss   . Allergic rhinitis   . Cervical spondylosis 05/2010    s/p surgery  . DDD (degenerative disc disease)   . Lumbar herniated disc   . Spinal stenosis     released from [facility].   Pain meds prescribed by Danise Mina - received percocets from Dr. Erling Cruz 07/2012  . AAA (abdominal aortic aneurysm) 09/2012    limited study, stable. rec rpt with other modality (consider rpt 1 yr)  . Hip osteoarthritis     s/p intraarticular steroid shot (12/2012) (Ibazebo/Caffrey)    Past Surgical History  Procedure Laterality Date  . Tonsillectomy and adenoidectomy  1972  . Lumbar disc surgery      L5-S1  . Cervical discectomy  01/2010    Anterior C4-7, cervical cord decompression and B foraminotomy (Botero)(metal plate)  . Lumbar laminectomy  12/11    L2-5laminectomy/foraminotomy for stenosis (Botero)  . Horizon study stenting x4  2000, 2005    in the RCA with kissing balloon angioplasty, of the PDA ostium-5 stents total per patient  . Cardiac catheterization  03/30/05    Left, Left ventriculography, coronary angiography, intravascular [place] of RCA  . Myelogram      L5-S1 and L1-2 spondylosis  . Scrotal US  07/2010    hydradenitis - testes WNL, small R epididymal cyst, small L varicocele, no sinus tract mention  . Colonoscopy  05/06/2007    normal, small int hemorrhoids rpt 5 yrs due to fmhx  Review of Systems Per HPI    Objective:   Physical Exam  Nursing note and vitals reviewed. Constitutional: He appears well-developed and well-nourished. No distress.  HENT:  Head: Normocephalic and atraumatic.  No residual swelling at left lateral eyebrow  Neck: Normal range of motion. Neck supple.  Cardiovascular: Normal rate, regular rhythm, normal heart sounds and intact distal pulses.   No murmur heard. Pulmonary/Chest: Effort normal and breath sounds normal. No respiratory distress. He has no wheezes. He has no rales.  Musculoskeletal: He exhibits no edema.  Diabetic foot exam: Normal inspection No skin breakdown No calluses  Normal DP/PT pulses Normal sensation to light touch and monofilament Decreased sensation to temperature Nails normal  Lymphadenopathy:    He has no cervical  adenopathy.  Skin: Skin is warm and dry. No rash noted.  Psychiatric: He has a normal mood and affect.       Assessment & Plan:

## 2013-03-13 NOTE — Assessment & Plan Note (Signed)
rec f/u with Dr. French Ana.

## 2013-03-13 NOTE — Assessment & Plan Note (Addendum)
Chronic, stable. Continue med. BP Readings from Last 3 Encounters:  03/13/13 136/84  01/30/13 108/71  01/23/13 118/76  looks like losartan was discontinued in august - unclear why - will need to discuss at f/u visit next month.

## 2013-03-13 NOTE — Assessment & Plan Note (Signed)
Well controlled as of last A1c - with recent weight gain anticipate deterioration but still good control. Lab Results  Component Value Date   HGBA1C 5.8 05/06/2012  check A1c next blood work. Glucometer and strips (onetouch) refilled today.

## 2013-03-13 NOTE — Assessment & Plan Note (Signed)
Reviewed pain meds - will continue percocets for now.   Hesitant for pain clinic and for long acting narcotic in the past.

## 2013-03-13 NOTE — Assessment & Plan Note (Signed)
Wt Readings from Last 3 Encounters:  03/13/13 307 lb 4 oz (139.368 kg)  01/30/13 312 lb (141.522 kg)  01/23/13 302 lb (136.986 kg)

## 2013-04-02 ENCOUNTER — Other Ambulatory Visit: Payer: Self-pay | Admitting: Family Medicine

## 2013-04-03 ENCOUNTER — Other Ambulatory Visit: Payer: Self-pay

## 2013-04-03 NOTE — Telephone Encounter (Signed)
[name] left v/m requesting hydrocodone apap. Call when ready for pickup.

## 2013-04-03 NOTE — Telephone Encounter (Signed)
Please verify with patient - does he want hydrocodone or oxycodone (last visit we had decided to continue oxycodone).

## 2013-04-04 ENCOUNTER — Ambulatory Visit (INDEPENDENT_AMBULATORY_CARE_PROVIDER_SITE_OTHER): Payer: Medicare PPO | Admitting: Family Medicine

## 2013-04-04 ENCOUNTER — Encounter: Payer: Self-pay | Admitting: Family Medicine

## 2013-04-04 ENCOUNTER — Other Ambulatory Visit: Payer: Self-pay | Admitting: Family Medicine

## 2013-04-04 VITALS — BP 132/88 | HR 84 | Temp 98.6°F | Wt 297.5 lb

## 2013-04-04 DIAGNOSIS — J441 Chronic obstructive pulmonary disease with (acute) exacerbation: Secondary | ICD-10-CM

## 2013-04-04 MED ORDER — HYDROCODONE-ACETAMINOPHEN 10-325 MG PO TABS: 1.0000 | ORAL_TABLET | Freq: Four times a day (QID) | ORAL | Status: DC | PRN

## 2013-04-04 MED ORDER — AMOXICILLIN-POT CLAVULANATE 875-125 MG PO TABS: 1.0000 | ORAL_TABLET | Freq: Two times a day (BID) | ORAL | Status: AC

## 2013-04-04 MED ORDER — PREDNISONE 20 MG PO TABS: ORAL_TABLET | ORAL | Status: DC

## 2013-04-04 MED ORDER — IPRATROPIUM BROMIDE 0.02 % IN SOLN
0.5000 mg | Freq: Once | RESPIRATORY_TRACT | Status: AC
  Administered 2013-04-04: 0.5 mg via RESPIRATORY_TRACT

## 2013-04-04 MED ORDER — ALBUTEROL SULFATE (2.5 MG/3ML) 0.083% IN NEBU
2.5000 mg | INHALATION_SOLUTION | Freq: Once | RESPIRATORY_TRACT | Status: AC
  Administered 2013-04-04: 2.5 mg via RESPIRATORY_TRACT

## 2013-04-04 MED ORDER — DIAZEPAM 10 MG PO TABS: ORAL_TABLET | ORAL | Status: DC

## 2013-04-04 NOTE — Telephone Encounter (Signed)
Patient wants hydrocodone. Oxycodone makes him too drowsy.

## 2013-04-04 NOTE — Assessment & Plan Note (Addendum)
Moderate COPD exacerbation with risk factors (CAD). Will treat with prednisone course, augmentin, and recommended regular use of albuterol neb at home for next 2-3 days If not improving as expected or any worsening, pt to return here for further evaluation. Alb/atrovent neb in office today - after treatment improved air movement but still with insp/exp wheezing.  Pt declined second breathing treatment.

## 2013-04-04 NOTE — Progress Notes (Signed)
  Subjective:    Patient ID: [name], male    DOB: July 06, 1955, 57 y.o.   MRN: [id number]  HPI CC: cough  2 wk h/o of cough.  rattly cough, but not productive.  Feverish.  Dyspnea, wheezing.  + HA.  Chest congestion > head congestion.  + ST.  No ear or tooth pain, abd pain, rashes.  Has tried mucinex which helps.  Smoker - 1/2 ppd.  Motivated to quit. H/o COPD.  Recently had L hip steroid injection with significant improvement. Would like refill of hydrocodone - because oxycodone causes oversedation  Had flu shot this season 2 wks ago.  Past Medical History  Diagnosis Date  . CAD (coronary artery disease)     x3 with stents last 2005, EF 40%  . Cardiomyopathy   . 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 COPD (chronic obstructive pulmonary disease) 10/2011    minimal by PFTs  . T2DM (type 2 diabetes mellitus)   . Disturbances of sensation of smell and taste   . Hepatic steatosis   . Gout   . Hidradenitis suppurativa 2011    followd by Lyndle Herrlich - daily bactrim, s/p intralesional steroid injection 10/2010  . HLD (hyperlipidemia)   . HTN (hypertension)   . History of MI (myocardial infarction)   . Obesity   . OSA (obstructive sleep apnea)   . Tobacco use disorder     cigar  . History of non anemic vitamin B12 deficiency     s/p B12 shots, now on oral supplementation (10/2011)  . Vitamin D deficiency   . Weight loss   . Allergic rhinitis   . Cervical spondylosis 05/2010    s/p surgery  . DDD (degenerative disc disease)   . Lumbar herniated disc   . Spinal stenosis     released from [facility].  Pain meds prescribed by Danise Mina - received percocets from Dr. Erling Cruz 07/2012  . AAA (abdominal aortic aneurysm) 09/2012    limited study, stable. rec rpt with other modality (consider rpt 1 yr)  . Hip osteoarthritis     s/p intraarticular steroid shot (12/2012) (Ibazebo/Caff[place])    Review of Systems Per HPI    Objective:   Physical Exam  Nursing note  and vitals reviewed. Constitutional: He appears well-developed and well-nourished. No distress.  HENT:  Head: Normocephalic and atraumatic.  Right Ear: Hearing, tympanic membrane, external ear and ear canal normal.  Left Ear: Hearing, tympanic membrane, external ear and ear canal normal.  Nose: Mucosal edema present. No rhinorrhea. Right sinus exhibits no maxillary sinus tenderness and no frontal sinus tenderness. Left sinus exhibits no maxillary sinus tenderness and no frontal sinus tenderness.  Mouth/Throat: Uvula is midline, oropharynx is clear and moist and mucous membranes are normal. No oropharyngeal exudate, posterior oropharyngeal edema, posterior oropharyngeal erythema or tonsillar abscesses.  Crusting of mucosa L>R nasal passage  Eyes: Conjunctivae and EOM are normal. Pupils are equal, round, and reactive to light. No scleral icterus.  Neck: Normal range of motion. Neck supple.  Cardiovascular: Normal rate, regular rhythm, normal heart sounds and intact distal pulses.   No murmur heard. Pulmonary/Chest: Effort normal. No respiratory distress. He has decreased breath sounds. He has wheezes (coarse insp/exp wheezing). He has no rales.  Lymphadenopathy:    He has no cervical adenopathy.  Skin: Skin is warm and dry. No rash noted.       Assessment & Plan:

## 2013-04-04 NOTE — Patient Instructions (Addendum)
Hydrocodone refilled today. I think you have COPD flare - we did breathing treatment today. Use albuterol every 4-6 hours at home for the next 2-3 days. Treat with steroid course and antibiotics sent to pharmacy. If not improving with above, please return to see me Monday or Tuesday. Quit smoking.

## 2013-04-14 ENCOUNTER — Ambulatory Visit: Payer: Medicare PPO | Admitting: Family Medicine

## 2013-05-02 ENCOUNTER — Other Ambulatory Visit: Payer: Self-pay | Admitting: *Deleted

## 2013-05-02 MED ORDER — HYDROCODONE-ACETAMINOPHEN 10-325 MG PO TABS
1.0000 | ORAL_TABLET | Freq: Four times a day (QID) | ORAL | Status: DC | PRN
Start: 2013-05-02 — End: 2013-06-04

## 2013-05-02 MED ORDER — DIAZEPAM 10 MG PO TABS: ORAL_TABLET | ORAL | Status: DC

## 2013-05-02 NOTE — Telephone Encounter (Signed)
Wife calls requesting pts rxs for diazepam and hydrocodone. Request call when ready.

## 2013-05-02 NOTE — Telephone Encounter (Signed)
Printed and signed.  Thanks.

## 2013-05-02 NOTE — Telephone Encounter (Signed)
Patient advised.  Rx left at front desk for pick up. 

## 2013-05-26 ENCOUNTER — Encounter: Payer: Self-pay | Admitting: *Deleted

## 2013-06-04 ENCOUNTER — Other Ambulatory Visit: Payer: Self-pay

## 2013-06-04 MED ORDER — DIAZEPAM 10 MG PO TABS: ORAL_TABLET | ORAL | Status: DC

## 2013-06-04 MED ORDER — HYDROCODONE-ACETAMINOPHEN 10-325 MG PO TABS: 1.0000 | ORAL_TABLET | Freq: Four times a day (QID) | ORAL | Status: DC | PRN

## 2013-06-04 NOTE — Telephone Encounter (Signed)
Patient notified and Rx placed up front for pick up. Diazepam called in as directed.

## 2013-06-04 NOTE — Telephone Encounter (Signed)
Mrs Truex left v/m requesting valium called in to [facility] and written rx hydrocodone apap. Call when ready for pick up.

## 2013-06-24 ENCOUNTER — Encounter: Payer: Self-pay | Admitting: Family Medicine

## 2013-06-24 ENCOUNTER — Ambulatory Visit (INDEPENDENT_AMBULATORY_CARE_PROVIDER_SITE_OTHER): Payer: Medicare PPO | Admitting: Family Medicine

## 2013-06-24 VITALS — BP 114/74 | HR 82 | Temp 97.9°F | Wt 304.8 lb

## 2013-06-24 DIAGNOSIS — G8929 Other chronic pain: Secondary | ICD-10-CM

## 2013-06-24 DIAGNOSIS — M169 Osteoarthritis of hip, unspecified: Secondary | ICD-10-CM

## 2013-06-24 DIAGNOSIS — M79609 Pain in unspecified limb: Secondary | ICD-10-CM

## 2013-06-24 DIAGNOSIS — G600 Hereditary motor and sensory neuropathy: Secondary | ICD-10-CM

## 2013-06-24 DIAGNOSIS — IMO0002 Reserved for concepts with insufficient information to code with codable children: Secondary | ICD-10-CM

## 2013-06-24 DIAGNOSIS — M1611 Unilateral primary osteoarthritis, right hip: Secondary | ICD-10-CM

## 2013-06-24 DIAGNOSIS — E119 Type 2 diabetes mellitus without complications: Secondary | ICD-10-CM

## 2013-06-24 DIAGNOSIS — M25551 Pain in right hip: Secondary | ICD-10-CM | POA: Insufficient documentation

## 2013-06-24 DIAGNOSIS — E669 Obesity, unspecified: Secondary | ICD-10-CM

## 2013-06-24 DIAGNOSIS — M79604 Pain in right leg: Secondary | ICD-10-CM

## 2013-06-24 DIAGNOSIS — M1610 Unilateral primary osteoarthritis, unspecified hip: Secondary | ICD-10-CM

## 2013-06-24 MED ORDER — HYDROCODONE-ACETAMINOPHEN 10-325 MG PO TABS: 1.0000 | ORAL_TABLET | Freq: Four times a day (QID) | ORAL | Status: DC | PRN

## 2013-06-24 MED ORDER — FEBUXOSTAT 40 MG PO TABS: 40.0000 mg | ORAL_TABLET | Freq: Every day | ORAL | Status: DC

## 2013-06-24 MED ORDER — NORTRIPTYLINE HCL 50 MG PO CAPS: 100.0000 mg | ORAL_CAPSULE | Freq: Every day | ORAL | Status: DC

## 2013-06-24 NOTE — Progress Notes (Signed)
Pre-visit discussion using our clinic review tool. No additional management support is needed unless otherwise documented below in the visit note.  

## 2013-06-24 NOTE — Assessment & Plan Note (Addendum)
S/p multiple spine surgeries. Current pain regimen not effective for pain control - and worsening pain recently with R hip DJD.   Will increase nortriptyline to 194m nightly. I will prescribe #30 norco 10/3240mto last him rest of month, and will refer to pain management per patient request. Hesitant for pain clinic and for long acting narcotics in the past. percocets cause over sedation.

## 2013-06-24 NOTE — Assessment & Plan Note (Signed)
Anterior right leg pain along shin - unclear etiology. Not consistent with stress fracture or necessarily shin splints. Offered xray but pt states he will schedule appt with ortho to further evaluate this.

## 2013-06-24 NOTE — Assessment & Plan Note (Signed)
Advised return to see Dr. Ron Agee for hip injection and Dr. French Ana for f/u R anterior leg pain.

## 2013-06-24 NOTE — Progress Notes (Signed)
Subjective:    Patient ID: [name], male    DOB: 08-27-1955, 58 y.o.   MRN: [id number]  HPI CC: discuss pain regimen and referral  H/o charcot marie tooth disease as well as history of hip osteoarthritis and cervical/lumbar DDD s/p surgeries.  He also has history of gout and hydradenitis suppurativa which cause intermittent pain.  Has seen dermatology who recommended soaking in tub for relief.  Has been unable to afford uloric - but planning on restarting.  Main pain from severe R DJD hip - told not surgical candidate 2/2 comorbidities. Intraarticular shots help for 1 month. Has had 2, planning on getting 3rd tomorrow with Dr. Ron Agee.  Sees Dr. French Ana ortho.  Trouble walking - requests folding rolling walker for this.  Over the last 1.5 months noticing R lower anterior leg pain from tibial tubercle down shin.  Also noticing ankle swelling.  Not like prior gout pain.    Chronic pain - percocets work better for pain, but cause oversedation.  Currently on hydrocodone 10/356m #120 per month.  Also takes nabumetone 7560m  Drinks 2 2L pepsi/day.  Working on decreasing soda intake. fmhx colon cancer - pt had colonoscopy 2008 and was recommended return in 5 years but pt does not desire to pursue this at this time, thinks will ask for referral in next few months but currently declines referral.  Medications and allergies reviewed and updated in chart.  Past histories reviewed and updated if relevant as below. Patient Active Problem List   Diagnosis Date Noted  . COPD exacerbation 04/04/2013  . Osteoarthritis of right hip 08/16/2012  . Medicare annual wellness visit, initial 06/07/2012  . AAA (abdominal aortic aneurysm)   . DOE (dyspnea on exertion) 10/06/2011  . DDD (degenerative disc disease)   . Vitamin D deficiency 03/14/2011  . Skin rash 03/14/2011  . History of non anemic vitamin B12 deficiency 08/05/2010  . Disturbances of sensation of smell and taste 08/02/2010  .  WEIGHT LOSS, RECENT 08/02/2010  . Diabetes type 2, controlled 06/27/2010  . OSA (obstructive sleep apnea) 05/16/2010  . Cervical spondylosis 05/05/2010  . OBESITY 05/03/2010  . Hidradenitis 05/03/2010  . TOBACCO ABUSE 10/21/2009  . HYPERLIPIDEMIA 09/07/2009  . GOUT 09/07/2009  . CHHagermanISEASE 09/07/2009  . HYPERTENSION 09/07/2009  . CAD 09/07/2009  . CARDIOMYOPATHY 09/07/2009  . COPD, minimal 09/07/2009  . Chronic pain 09/07/2009   Past Medical History  Diagnosis Date  . CAD (coronary artery disease)     x3 with stents last 2005, EF 40%  . Cardiomyopathy   . 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 LoErling Cruztype 2 per pt  . COPD (chronic obstructive pulmonary disease) 10/2011    minimal by PFTs  . T2DM (type 2 diabetes mellitus)   . Disturbances of sensation of smell and taste   . Hepatic steatosis   . Gout   . Hidradenitis suppurativa 2011    followd by LuLyndle Herrlich daily bactrim, s/p intralesional steroid injection 10/2010  . HLD (hyperlipidemia)   . HTN (hypertension)   . History of MI (myocardial infarction)   . Obesity   . OSA (obstructive sleep apnea)   . Tobacco use disorder     cigar  . History of non anemic vitamin B12 deficiency     s/p B12 shots, now on oral supplementation (10/2011)  . Vitamin D deficiency   . Weight loss   . Allergic rhinitis   . Cervical spondylosis 05/2010    s/p  surgery  . DDD (degenerative disc disease)   . Lumbar herniated disc   . Spinal stenosis     released from [facility].  Pain meds prescribed by Danise Mina - received percocets from Dr. Erling Cruz 07/2012  . AAA (abdominal aortic aneurysm) 09/2012    limited study, stable. rec rpt with other modality (consider rpt 1 yr)  . Hip osteoarthritis     s/p intraarticular steroid shot (12/2012) (Ibazebo/Caffrey)  . History of hepatitis B 1983  . History of viral meningitis 2000   Past Surgical History  Procedure Laterality Date  . Tonsillectomy and adenoidectomy  1972  . Lumbar disc  surgery      L5-S1  . Cervical discectomy  01/2010    Anterior C4-7, cervical cord decompression and B foraminotomy (Botero)(metal plate)  . Lumbar laminectomy  12/11    L2-5laminectomy/foraminotomy for stenosis (Botero)  . Horizon study stenting x4  2000, 2005    in the RCA with kissing balloon angioplasty, of the PDA ostium-5 stents total per patient  . Cardiac catheterization  03/30/05    Left, Left ventriculography, coronary angiography, intravascular [place] of RCA  . Myelogram      L5-S1 and L1-2 spondylosis  . Scrotal US  07/2010    hydradenitis - testes WNL, small R epididymal cyst, small L varicocele, no sinus tract mention  . Colonoscopy  05/06/2007    normal, small int hemorrhoids rpt 5 yrs due to fmhx   History  Substance Use Topics  . Smoking status: Current Every Day Smoker -- 0.50 packs/day for 43 years    Types: Cigarettes, Pipe  . Smokeless tobacco: Never Used  . Alcohol Use: No   Family History  Problem Relation Age of Onset  . Cancer Mother     colon  . Diabetes Mother   . Kidney disease Mother   . Aneurysm Mother     AAA  . Rheum arthritis Mother   . Charcot-Marie-Tooth disease Mother   . Cancer Father     skin  . Heart attack Father   . Cancer Brother     skin  . Coronary artery disease Brother   . Cancer Brother     small cell lung cancer  . Aneurysm Brother     AAA  . Rheum arthritis Sister   . Rheum arthritis Brother    Allergies  Allergen Reactions  . Statins Shortness Of Breath    Cough, trouble breathing  . Allopurinol Nausea Only  . Losartan    Current Outpatient Prescriptions on File Prior to Visit  Medication Sig Dispense Refill  . albuterol (PROVENTIL) (2.5 MG/3ML) 0.083% nebulizer solution USE 1 VIAL PER NEBULIZER EVERY 6 HRS AS NEEDED FOR WHEEZING  75 mL  6  . aspirin 325 MG tablet Take 325 mg by mouth daily.        . Blood Glucose Monitoring Suppl (ONE TOUCH ULTRA SYSTEM KIT) W/DEVICE KIT 1 kit by Does not apply route once.  1 each   0  . Cholecalciferol (VITAMIN D) 2000 UNITS CAPS Take 1 capsule (2,000 Units total) by mouth daily.  30 capsule  11  . clindamycin (CLEOCIN T) 1 % external solution Apply topically 2 (two) times daily.  30 mL  0  . diazepam (VALIUM) 10 MG tablet TAKE 1 TABLET BY MOUTH TWICE A DAY  60 tablet  0  . DULoxetine (CYMBALTA) 60 MG capsule Take 60 mg by mouth daily.      . fluticasone (FLONASE) 50 MCG/ACT nasal spray       .  folic acid (FOLVITE) 1 MG tablet TAKE 1 TABLET BY MOUTH ONCE A DAY  90 tablet  3  . glucose blood (ONE TOUCH ULTRA TEST) test strip Use as instructed.  250.00  100 each  12  . HYDROcodone-acetaminophen (NORCO) 10-325 MG per tablet Take 1 tablet by mouth every 6 (six) hours as needed.  120 tablet  0  . metFORMIN (GLUCOPHAGE) 500 MG tablet Take 1 tablet (500 mg total) by mouth at bedtime.  90 tablet  3  . metoprolol succinate (TOPROL-XL) 50 MG 24 hr tablet TAKE 1/2 TABLET EVERY DAY  30 tablet  6  . nitroGLYCERIN (NITROSTAT) 0.4 MG SL tablet Place 1 tablet (0.4 mg total) under the tongue every 5 (five) minutes as needed for chest pain.  25 tablet  12  . nortriptyline (PAMELOR) 50 MG capsule Take 1 capsule (50 mg total) by mouth at bedtime.  30 capsule  11  . vitamin B-12 (CYANOCOBALAMIN) 500 MCG tablet Take 500 mcg by mouth daily.      [name] doxycycline (VIBRAMYCIN) 100 MG capsule Take 100 mg by mouth 2 (two) times daily.      [name] erythromycin with ethanol (EMGEL) 2 % gel Apply topically Daily.       . febuxostat (ULORIC) 40 MG tablet Take 1 tablet (40 mg total) by mouth daily. Allopurinol intolerant  90 tablet  3  . predniSONE (DELTASONE) 20 MG tablet Take two tablets daily for 3 days followed by one tablet daily for 4 days  10 tablet  0   No current facility-administered medications on file prior to visit.    Review of Systems Per HPI    Objective:   Physical Exam  Nursing note and vitals reviewed. Constitutional: He appears well-developed and well-nourished. No distress.  obese    HENT:  Mouth/Throat: Oropharynx is clear and moist. No oropharyngeal exudate.  Cardiovascular: Normal rate, regular rhythm, normal heart sounds and intact distal pulses.   No murmur heard. Pulmonary/Chest: Effort normal and breath sounds normal. No respiratory distress. He has no wheezes. He has no rales.  Musculoskeletal: He exhibits no edema.  Tender to palpation just distal to tibial tubercle and along anterior tibia down just distal to mid shin. No edema, no erythema, no point tenderness. Tender anterior tibia with squeezing of lower leg       Assessment & Plan:

## 2013-06-24 NOTE — Patient Instructions (Addendum)
Pass by Marion's office to make sure you are all set with [facility] for your appointment tomorrow with Dr. French Ana Also we will refer you to pain clinic. Increase nortriptyline to 130m nightly. I have provided you with #30 hydrocodone to last you rest of January.  See Dr. CFrench Anatomorrow for hip injection. Decrease metformin to 1/2 tablet nightly

## 2013-06-24 NOTE — Assessment & Plan Note (Addendum)
Chronically well controlled on metformin - will decrease to 264m nightly. Advised schedule eye exam as long overdue Lab Results  Component Value Date   HGBA1C 5.8 05/06/2012

## 2013-06-27 ENCOUNTER — Telehealth: Payer: Self-pay

## 2013-06-27 NOTE — Telephone Encounter (Signed)
Relevant patient education assigned to patient using Emmi. ° °

## 2013-06-30 ENCOUNTER — Telehealth: Payer: Self-pay | Admitting: Family Medicine

## 2013-06-30 MED ORDER — HYDROCODONE-ACETAMINOPHEN 10-325 MG PO TABS: 1.0000 | ORAL_TABLET | ORAL | Status: DC | PRN

## 2013-06-30 NOTE — Telephone Encounter (Signed)
Spoke with patient. He said he is requiring between 4-5 pills daily most of the time. He doesn't have a pain clinic appt yet. He is still waiting to hear from [facility].

## 2013-06-30 NOTE — Telephone Encounter (Signed)
Pt was recently prescribed Hydrocodone until he is seen by the pain clinic. He just used his last pill and his wife is calling to see if he could get a refill until he is able to get to the pain clinic.

## 2013-06-30 NOTE — Telephone Encounter (Signed)
Printed script and placed in [name]'s box.

## 2013-06-30 NOTE — Telephone Encounter (Signed)
He has been taking 5 hydrocodone 10/325 daily although script said to take every 6 hours as needed.  When is pain clinic appointment?

## 2013-06-30 NOTE — Telephone Encounter (Signed)
Ok to refill? Per last office note-Rx should've lasted through the end of the month. He is out 5 days early.

## 2013-07-01 MED ORDER — DIAZEPAM 10 MG PO TABS: ORAL_TABLET | ORAL | Status: DC

## 2013-07-01 NOTE — Telephone Encounter (Signed)
Printed and placed in [name]'s box.

## 2013-07-01 NOTE — Telephone Encounter (Signed)
Pt's wife called and said that [facility].  Refill from 06/24/13 says that refill should last him to the end of January.  She said that pt has appt at pain clinic on Friday.  Please advise.

## 2013-07-01 NOTE — Telephone Encounter (Signed)
Patient's wife called and was very angry that [facility] wouldn't [facility] because they are saying it is too soon and he shouldn't need Rx until February. She wants to know what he is supposed to do since he is in so much pain. I reminded her that if he noticing that he was requiring med more often than Rx said, he should've called. But, since the pharmacy was refusing the refill that you approved-we would have to call and speak with them. She was still very mad and couldn't understand how the pharmacy could go above your head and make decisions like that. I advised her that it was a Scientist, water quality and that it needed to be looked into from their standpoint to protect everyone involved. I advised I would call her back and let her know what I found out. She said patient has an appt with [facility] at 9:50 on 07/04/13.

## 2013-07-01 NOTE — Telephone Encounter (Signed)
[facility] to approve early refill.

## 2013-07-01 NOTE — Telephone Encounter (Signed)
[facility] and [facility]. Patient notified.

## 2013-07-01 NOTE — Telephone Encounter (Signed)
[facility] also left [facility] that they need to discuss this RX.    120 tablets dispensed 12/1 120 tablets dispensed 12/31 30 tablets dispensed 1/20

## 2013-07-01 NOTE — Telephone Encounter (Signed)
Ok to refill this as well? Wants to p/u at the same time.

## 2013-07-01 NOTE — Telephone Encounter (Signed)
Pt's wife is calling back and says [name] forgot ot ask about getting a refill on Diazapam ??? Pt's wife says she wants to come pick both rx's up at the same time if she could. Please advise.

## 2013-07-02 NOTE — Telephone Encounter (Signed)
[facility] as directed.

## 2013-07-07 ENCOUNTER — Telehealth: Payer: Self-pay | Admitting: Family Medicine

## 2013-07-07 NOTE — Telephone Encounter (Signed)
Relevant patient education assigned to patient using Emmi. ° °

## 2013-07-09 ENCOUNTER — Other Ambulatory Visit: Payer: Self-pay | Admitting: *Deleted

## 2013-07-09 DIAGNOSIS — G600 Hereditary motor and sensory neuropathy: Secondary | ICD-10-CM

## 2013-07-09 DIAGNOSIS — E669 Obesity, unspecified: Secondary | ICD-10-CM

## 2013-07-09 MED ORDER — FEBUXOSTAT 40 MG PO TABS: 40.0000 mg | ORAL_TABLET | Freq: Every day | ORAL | Status: DC

## 2013-07-27 ENCOUNTER — Encounter: Payer: Self-pay | Admitting: Family Medicine

## 2013-09-03 NOTE — Telephone Encounter (Signed)
Closing encounter

## 2013-10-03 HISTORY — PX: SACROILIAC JOINT INJECTION: SHX2370

## 2013-10-13 ENCOUNTER — Other Ambulatory Visit: Payer: Self-pay | Admitting: Neurology

## 2013-10-14 NOTE — Telephone Encounter (Signed)
Former [facility] patient assigned to Dr Leta Baptist.  Was seen in August

## 2013-11-04 ENCOUNTER — Encounter: Payer: Self-pay | Admitting: Family Medicine

## 2013-11-10 ENCOUNTER — Other Ambulatory Visit: Payer: Self-pay | Admitting: Family Medicine

## 2013-12-04 ENCOUNTER — Other Ambulatory Visit: Payer: Self-pay | Admitting: Family Medicine

## 2013-12-12 ENCOUNTER — Telehealth: Payer: Self-pay | Admitting: *Deleted

## 2013-12-12 NOTE — Telephone Encounter (Signed)
Lm on pts vm requesting he contact office to schedule DIABETIC BUNDLE lab appt- needing A1C and lipid panel

## 2013-12-19 ENCOUNTER — Encounter: Payer: Self-pay | Admitting: Cardiology

## 2013-12-19 ENCOUNTER — Ambulatory Visit (INDEPENDENT_AMBULATORY_CARE_PROVIDER_SITE_OTHER): Payer: Medicare PPO | Admitting: Cardiology

## 2013-12-19 VITALS — BP 118/90 | HR 86 | Ht 67.0 in | Wt 293.0 lb

## 2013-12-19 DIAGNOSIS — I714 Abdominal aortic aneurysm, without rupture, unspecified: Secondary | ICD-10-CM

## 2013-12-19 DIAGNOSIS — I2510 Atherosclerotic heart disease of native coronary artery without angina pectoris: Secondary | ICD-10-CM

## 2013-12-19 MED ORDER — IRBESARTAN 75 MG PO TABS: 75.0000 mg | ORAL_TABLET | Freq: Every day | ORAL | Status: DC

## 2013-12-19 NOTE — Assessment & Plan Note (Signed)
Continue diet. Intolerant to statins.

## 2013-12-19 NOTE — Assessment & Plan Note (Signed)
Schedule CTA to further assess.

## 2013-12-19 NOTE — Progress Notes (Signed)
HPI: FU coronary artery disease. He has had prior inferior infarct with PCI of his right coronary artery and PDA. His most recent cardiac catheterization was performed on March 30, 2005. The patient had an ejection fraction of 40% with inferior akinesis at that time. However, his stents in the right coronary artery were widely patent. Note, his most recent echocardiogram on March 30, 2005 showed normal to mildly reduced LV function. Myoview in May of 2014 showed an inferior infarct but no ischemia. Ejection fraction 39%. Abdominal ultrasound in April of 2014 showed 3 x 3 cm aneurysm. Technically difficult study. Alternative imaging recommended for followup. This is followed by primary care. Since last seen, he has some dyspnea on exertion but no orthopnea, PND, pedal edema, chest pain or syncope.   Current Outpatient Prescriptions  Medication Sig Dispense Refill  . albuterol (PROVENTIL) (2.5 MG/3ML) 0.083% nebulizer solution USE 1 VIAL PER NEBULIZER EVERY 6 HRS AS NEEDED FOR WHEEZING  75 mL  6  . aspirin 325 MG tablet Take 325 mg by mouth daily.        . Blood Glucose Monitoring Suppl (ONE TOUCH ULTRA SYSTEM KIT) W/DEVICE KIT 1 kit by Does not apply route once.  1 each  0  . Cholecalciferol (VITAMIN D) 2000 UNITS CAPS Take 1 capsule (2,000 Units total) by mouth daily.  30 capsule  11  . clindamycin (CLEOCIN T) 1 % external solution Apply topically 2 (two) times daily.  30 mL  0  . doxycycline (VIBRAMYCIN) 100 MG capsule Take 100 mg by mouth 2 (two) times daily.      . DULoxetine (CYMBALTA) 60 MG capsule TAKE ONE CAPSULE BY MOUTH EVERY DAY  30 capsule  3  . erythromycin with ethanol (EMGEL) 2 % gel Apply topically Daily.       . febuxostat (ULORIC) 40 MG tablet Take 1 tablet (40 mg total) by mouth daily. Allopurinol intolerant  90 tablet  3  . fluticasone (FLONASE) 50 MCG/ACT nasal spray       . folic acid (FOLVITE) 1 MG tablet TAKE 1 TABLET BY MOUTH ONCE A DAY  90 tablet  3  . glucose  blood (ONE TOUCH ULTRA TEST) test strip Use as instructed.  250.00  100 each  12  . metFORMIN (GLUCOPHAGE) 500 MG tablet Take 0.5 tablets (250 mg total) by mouth at bedtime.  90 tablet  1  . metoprolol succinate (TOPROL-XL) 50 MG 24 hr tablet TAKE 1/2 TABLET EVERY DAY  30 tablet  3  . mirtazapine (REMERON) 30 MG tablet Take 30 mg by mouth at bedtime.      [name] morphine (MSIR) 30 MG tablet Take 30 mg by mouth 5 (five) times daily.      . nitroGLYCERIN (NITROSTAT) 0.4 MG SL tablet Place 1 tablet (0.4 mg total) under the tongue every 5 (five) minutes as needed for chest pain.  25 tablet  12  . nortriptyline (PAMELOR) 50 MG capsule Take 2 capsules (100 mg total) by mouth at bedtime.  60 capsule  11  . OxyCODONE HCl ER (OXYCONTIN) 30 MG T12A Take 30 mg by mouth every 8 (eight) hours.      . vitamin B-12 (CYANOCOBALAMIN) 500 MCG tablet Take 500 mcg by mouth daily.       No current facility-administered medications for this visit.     Past Medical History  Diagnosis Date  . CAD (coronary artery disease)     x3 with stents last 2005, EF  40%  . Cardiomyopathy   . 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 COPD (chronic obstructive pulmonary disease) 10/2011    minimal by PFTs  . T2DM (type 2 diabetes mellitus)   . Disturbances of sensation of smell and taste   . Hepatic steatosis   . Gout   . Hidradenitis suppurativa 2011    followd by Lyndle Herrlich - daily bactrim, s/p intralesional steroid injection 10/2010  . HLD (hyperlipidemia)   . HTN (hypertension)   . History of MI (myocardial infarction)   . Obesity   . OSA (obstructive sleep apnea)   . Tobacco use disorder     cigar  . History of non anemic vitamin B12 deficiency     s/p B12 shots, now on oral supplementation (10/2011)  . Vitamin D deficiency   . Weight loss   . Allergic rhinitis   . Cervical spondylosis 05/2010    s/p surgery  . DDD (degenerative disc disease)   . Lumbar herniated disc   . Spinal stenosis      released from [place].  established with [facility] (07/2013)  . AAA (abdominal aortic aneurysm) 09/2012    limited study, stable. rec rpt with other modality (consider rpt 1 yr)  . Hip osteoarthritis     s/p intraarticular steroid shot (12/2012) ([place]/[facility])  . History of hepatitis B 1983  . History of viral meningitis 2000  . Chronic pain syndrome     [facility]    Past Surgical History  Procedure Laterality Date  . Tonsillectomy and adenoidectomy  1972  . Lumbar disc surgery      L5-S1  . Cervical discectomy  01/2010    Anterior C4-7, cervical cord decompression and B foraminotomy ([place])(metal plate)  . Lumbar laminectomy  12/11    L2-5laminectomy/foraminotomy for stenosis ([place])  . [place] study stenting x4  2000, 2005    in the RCA with kissing balloon angioplasty, of the PDA ostium-5 stents total per patient  . Cardiac catheterization  03/30/05    Left, Left ventriculography, coronary angiography, intravascular [place] of RCA  . Myelogram      L5-S1 and L1-2 spondylosis  . Scrotal US  07/2010    hydradenitis - testes WNL, small R epididymal cyst, small L varicocele, no sinus tract mention  . Colonoscopy  05/06/2007    normal, small int hemorrhoids rpt 5 yrs due to fmhx  . Sacroiliac joint injection Bilateral 10/2013    [place]    History   Social History  . Marital Status: Married    Spouse Name: [name]    Number of Children: 0  . Years of Education: College   Occupational History  . Dental technician implants, crown and bridge-now disability 2006    Social History Main Topics  . Smoking status: Current Every Day Smoker -- 0.50 packs/day for 43 years    Types: Cigarettes, Pipe  . Smokeless tobacco: Never Used  . Alcohol Use: No  . Drug Use: No  . Sexual Activity: Not on file   Other Topics Concern  . Not on file   Social History Narrative   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 x 5 years   caffeine: 2 cups coffee, 2  cups soda   Occupation: Neurosurgeon implants, crowne and bridge, now disability 2006   Lives with wife, 1 dog, no children    ROS: Back and neck pain but no fevers or chills, productive cough, hemoptysis, dysphasia,  odynophagia, melena, hematochezia, dysuria, hematuria, rash, seizure activity, orthopnea, PND, pedal edema, claudication. Remaining systems are negative.  Physical Exam: Well-developed obese in no acute distress.  Skin is warm and dry.  HEENT is normal.  Neck is supple.  Chest is clear to auscultation with normal expansion.  Cardiovascular exam is regular rate and rhythm.  Abdominal exam nontender or distended. No masses palpated. Extremities show no edema. neuro grossly intact  ECG Sinus rhythm at a rate of 86.Nonspecific ST changes.

## 2013-12-19 NOTE — Assessment & Plan Note (Addendum)
Continue aspirin. Intolerant to statin.

## 2013-12-19 NOTE — Assessment & Plan Note (Addendum)
Patient with ischemic cardiomyopathy. Continue beta blocker. Begin Avapro 75 mg daily. Check Renal function and potassium in one week.

## 2013-12-19 NOTE — Assessment & Plan Note (Signed)
Blood pressure controlled. Continue present medications. 

## 2013-12-19 NOTE — Patient Instructions (Signed)
Your physician wants you to follow-up in: [place] will receive a reminder letter in the mail two months in advance. If you don't receive a letter, please call our office to schedule the follow-up appointment.   Non-Cardiac CT Angiography (CTA), is a special type of CT scan that uses a computer to produce multi-dimensional views of major blood vessels throughout the body. In CT angiography, a contrast material is injected through an IV to help visualize the blood vessels ABDOMINAL CTA WITH AND WITHOUT CONTRAST TO F/U AAA, SCHEDULE AFTER BLOOD WORK IN ONE WEEK-SCHEDULE AT THE [address] OFFICE  START AVAPRO 82 MG ONCE DAILY  Your physician recommends that you HAVE LAB WORK IN ONE WEEK

## 2013-12-19 NOTE — Assessment & Plan Note (Signed)
Patient counseled on discontinuing. 

## 2013-12-31 ENCOUNTER — Other Ambulatory Visit: Payer: Self-pay | Admitting: *Deleted

## 2013-12-31 ENCOUNTER — Telehealth: Payer: Self-pay | Admitting: Cardiology

## 2013-12-31 DIAGNOSIS — Z01810 Encounter for preprocedural cardiovascular examination: Secondary | ICD-10-CM

## 2013-12-31 DIAGNOSIS — I2510 Atherosclerotic heart disease of native coronary artery without angina pectoris: Secondary | ICD-10-CM

## 2013-12-31 NOTE — Telephone Encounter (Signed)
Spoke with patient's wife regarding CTA of Abdomen that was ordered by Dr. Stanford Breed.  This is scheduled for 01/06/14 @ 10:00 am---arrive 9:45 am at [address]---NPO 4 hours prior NO METFORMIN THE DAY OF HIS CTA.  Mrs. Meas voiced her understanding.

## 2014-01-02 ENCOUNTER — Telehealth: Payer: Self-pay | Admitting: Cardiology

## 2014-01-02 NOTE — Telephone Encounter (Signed)
Patient is having blood work done on Monday of next week and wants to know if we can check for Lyme disease and he would also like to know his blood type.  Patient states that he a a tick bite a couple of years ago and has been extremely fatigued ever since.  Please call.

## 2014-01-02 NOTE — Telephone Encounter (Signed)
Spoke with pt, he will follow up with his primary care doctor

## 2014-01-03 DIAGNOSIS — K7581 Nonalcoholic steatohepatitis (NASH): Secondary | ICD-10-CM

## 2014-01-03 DIAGNOSIS — K7460 Unspecified cirrhosis of liver: Secondary | ICD-10-CM | POA: Insufficient documentation

## 2014-01-03 HISTORY — DX: Unspecified cirrhosis of liver: K74.60

## 2014-01-03 HISTORY — DX: Nonalcoholic steatohepatitis (NASH): K75.81

## 2014-01-06 ENCOUNTER — Ambulatory Visit (INDEPENDENT_AMBULATORY_CARE_PROVIDER_SITE_OTHER)
Admission: RE | Admit: 2014-01-06 | Discharge: 2014-01-06 | Disposition: A | Discharge status: Home / Self Care | Payer: Medicare PPO | Source: Ambulatory Visit | Attending: Cardiology | Admitting: Cardiology

## 2014-01-06 DIAGNOSIS — I714 Abdominal aortic aneurysm, without rupture, unspecified: Secondary | ICD-10-CM

## 2014-01-06 LAB — BASIC METABOLIC PANEL WITH GFR
BUN: 15 mg/dL (ref 6–23)
CALCIUM: 8.9 mg/dL (ref 8.4–10.5)
CHLORIDE: 101 meq/L (ref 96–112)
CO2: 26 mEq/L (ref 19–32)
CREATININE: 0.85 mg/dL (ref 0.50–1.35)
GFR, Est African American: >89 mL/min
Glucose, Bld: 89 mg/dL (ref 70–99)
Potassium: 4.8 mEq/L (ref 3.5–5.3)
Sodium: 135 mEq/L (ref 135–145)

## 2014-01-06 MED ORDER — IOHEXOL 350 MG/ML SOLN: 100.0000 mL | Freq: Once | INTRAVENOUS | Status: AC | PRN

## 2014-01-09 ENCOUNTER — Encounter: Payer: Self-pay | Admitting: Family Medicine

## 2014-01-09 ENCOUNTER — Other Ambulatory Visit: Payer: Self-pay | Admitting: Pain Medicine

## 2014-01-09 DIAGNOSIS — M545 Low back pain, unspecified: Secondary | ICD-10-CM

## 2014-01-10 ENCOUNTER — Other Ambulatory Visit: Payer: Self-pay | Admitting: Family Medicine

## 2014-01-10 DIAGNOSIS — K7460 Unspecified cirrhosis of liver: Secondary | ICD-10-CM

## 2014-01-10 DIAGNOSIS — E119 Type 2 diabetes mellitus without complications: Secondary | ICD-10-CM

## 2014-01-10 DIAGNOSIS — M109 Gout, unspecified: Secondary | ICD-10-CM

## 2014-01-10 DIAGNOSIS — Z125 Encounter for screening for malignant neoplasm of prostate: Secondary | ICD-10-CM

## 2014-01-10 DIAGNOSIS — E559 Vitamin D deficiency, unspecified: Secondary | ICD-10-CM

## 2014-01-10 DIAGNOSIS — E785 Hyperlipidemia, unspecified: Secondary | ICD-10-CM

## 2014-01-10 DIAGNOSIS — K7581 Nonalcoholic steatohepatitis (NASH): Secondary | ICD-10-CM

## 2014-01-10 DIAGNOSIS — I10 Essential (primary) hypertension: Secondary | ICD-10-CM

## 2014-01-10 DIAGNOSIS — E669 Obesity, unspecified: Secondary | ICD-10-CM

## 2014-01-12 ENCOUNTER — Other Ambulatory Visit (INDEPENDENT_AMBULATORY_CARE_PROVIDER_SITE_OTHER): Payer: Medicare PPO

## 2014-01-12 DIAGNOSIS — K7460 Unspecified cirrhosis of liver: Secondary | ICD-10-CM

## 2014-01-12 DIAGNOSIS — E559 Vitamin D deficiency, unspecified: Secondary | ICD-10-CM

## 2014-01-12 DIAGNOSIS — M109 Gout, unspecified: Secondary | ICD-10-CM

## 2014-01-12 DIAGNOSIS — Z125 Encounter for screening for malignant neoplasm of prostate: Secondary | ICD-10-CM

## 2014-01-12 DIAGNOSIS — E785 Hyperlipidemia, unspecified: Secondary | ICD-10-CM

## 2014-01-12 DIAGNOSIS — K7689 Other specified diseases of liver: Secondary | ICD-10-CM

## 2014-01-12 DIAGNOSIS — K7581 Nonalcoholic steatohepatitis (NASH): Principal | ICD-10-CM

## 2014-01-12 DIAGNOSIS — E119 Type 2 diabetes mellitus without complications: Secondary | ICD-10-CM

## 2014-01-12 DIAGNOSIS — I10 Essential (primary) hypertension: Secondary | ICD-10-CM

## 2014-01-12 LAB — URIC ACID: URIC ACID, SERUM: 5.5 mg/dL (ref 4.0–7.8)

## 2014-01-12 LAB — CBC WITH DIFFERENTIAL/PLATELET
Basophils Absolute: 0 K/uL (ref 0.0–0.1)
Basophils Relative: 0.3 % (ref 0.0–3.0)
Eosinophils Absolute: 0.2 K/uL (ref 0.0–0.7)
Eosinophils Relative: 2.7 % (ref 0.0–5.0)
HCT: 43.3 % (ref 39.0–52.0)
Hemoglobin: 14.4 g/dL (ref 13.0–17.0)
Lymphocytes Relative: 25.1 % (ref 12.0–46.0)
Lymphs Abs: 2.3 K/uL (ref 0.7–4.0)
MCHC: 33.4 g/dL (ref 30.0–36.0)
MCV: 99 fl (ref 78.0–100.0)
Monocytes Absolute: 0.3 K/uL (ref 0.1–1.0)
Monocytes Relative: 3.6 % (ref 3.0–12.0)
Neutro Abs: 6.2 K/uL (ref 1.4–7.7)
Neutrophils Relative %: 68.3 % (ref 43.0–77.0)
Platelets: 165 K/uL (ref 150.0–400.0)
RBC: 4.37 Mil/uL (ref 4.22–5.81)
RDW: 15.5 % (ref 11.5–15.5)
WBC: 9.1 K/uL (ref 4.0–10.5)

## 2014-01-12 LAB — LIPID PANEL
CHOLESTEROL: 180 mg/dL (ref 0–200)
HDL: 32.2 mg/dL — AB (ref >39.00)
LDL Cholesterol: 109 mg/dL — ABNORMAL HIGH (ref 0–99)
NonHDL: 147.8
Total CHOL/HDL Ratio: 6
Triglycerides: 195 mg/dL — ABNORMAL HIGH (ref 0.0–149.0)
VLDL: 39 mg/dL (ref 0.0–40.0)

## 2014-01-12 LAB — HEPATIC FUNCTION PANEL
ALBUMIN: 3.2 g/dL — AB (ref 3.5–5.2)
ALT: 22 U/L (ref 0–53)
AST: 30 U/L (ref 0–37)
Alkaline Phosphatase: 109 U/L (ref 39–117)
BILIRUBIN TOTAL: 0.7 mg/dL (ref 0.2–1.2)
Bilirubin, Direct: 0.2 mg/dL (ref 0.0–0.3)
Total Protein: 7.1 g/dL (ref 6.0–8.3)

## 2014-01-12 LAB — PSA: PSA: 0.19 ng/mL (ref 0.10–4.00)

## 2014-01-12 LAB — HEMOGLOBIN A1C: Hgb A1c MFr Bld: 6.1 % (ref 4.6–6.5)

## 2014-01-12 LAB — PROTIME-INR
INR: 1.1 ratio — AB (ref 0.8–1.0)
Prothrombin Time: 11.7 s (ref 9.6–13.1)

## 2014-01-12 LAB — VITAMIN D 25 HYDROXY (VIT D DEFICIENCY, FRACTURES): VITD: 16.85 ng/mL — ABNORMAL LOW (ref 30.00–100.00)

## 2014-01-13 LAB — HEPATITIS PANEL, ACUTE
HCV Ab: NEGATIVE
HEP A IGM: NONREACTIVE
HEP B C IGM: NONREACTIVE
Hepatitis B Surface Ag: NEGATIVE

## 2014-01-13 LAB — HEPATITIS A ANTIBODY, TOTAL: Hep A Total Ab: REACTIVE — AB

## 2014-01-15 ENCOUNTER — Encounter: Payer: Self-pay | Admitting: Family Medicine

## 2014-01-15 ENCOUNTER — Ambulatory Visit (INDEPENDENT_AMBULATORY_CARE_PROVIDER_SITE_OTHER): Payer: Medicare PPO | Admitting: Family Medicine

## 2014-01-15 VITALS — BP 116/64 | HR 80 | Temp 98.3°F | Wt 295.5 lb

## 2014-01-15 DIAGNOSIS — G8929 Other chronic pain: Secondary | ICD-10-CM

## 2014-01-15 DIAGNOSIS — K7689 Other specified diseases of liver: Secondary | ICD-10-CM

## 2014-01-15 DIAGNOSIS — E559 Vitamin D deficiency, unspecified: Secondary | ICD-10-CM

## 2014-01-15 DIAGNOSIS — E119 Type 2 diabetes mellitus without complications: Secondary | ICD-10-CM

## 2014-01-15 DIAGNOSIS — G4733 Obstructive sleep apnea (adult) (pediatric): Secondary | ICD-10-CM

## 2014-01-15 DIAGNOSIS — K7460 Unspecified cirrhosis of liver: Secondary | ICD-10-CM

## 2014-01-15 DIAGNOSIS — K7581 Nonalcoholic steatohepatitis (NASH): Principal | ICD-10-CM

## 2014-01-15 MED ORDER — POLYETHYLENE GLYCOL 3350 17 GM/SCOOP PO POWD: 17.0000 g | Freq: Every day | ORAL | Status: DC | PRN

## 2014-01-15 MED ORDER — GLUCOSE BLOOD VI STRP: ORAL_STRIP | Status: DC

## 2014-01-15 NOTE — Progress Notes (Signed)
Pre visit review using our clinic review tool, if applicable. No additional management support is needed unless otherwise documented below in the visit note. 

## 2014-01-15 NOTE — Progress Notes (Signed)
BP 116/64  Pulse 80  Temp(Src) 98.3 F (36.8 C) (Oral)  Wt 295 lb 8 oz (134.038 kg)   CC: discuss recent imaging results.  Subjective:    Patient ID: [name], male    DOB: 1955-09-21, 58 y.o.   MRN: [id number]  HPI: [name] is a 58 y.o. male presenting on 01/15/2014 for Follow-up   H/o charcot marie tooth disease as well as history of hip osteoarthritis and cervical/lumbar DDD s/p surgeries. He also has history of gout and hydradenitis suppurativa which cause intermittent pain. Gout better controlled on uloric  [facility] and is currently on MScontin 71m TID and MSIR 140mprn. Did not respond well to bilateral SIJ steroid injections.  Having chronic constipation - narcotic induced.  Just started drinking water. Did not respond to unknown samples provided by pain clinic  Recently seen by cards who ordered CTA abdomen for f/u abdominal anuerysm - with incidental finding of hepatic cirrhosis: IMPRESSION:  Infrarenal atherosclerotic abdominal aortic aneurysm with both fusiform and saccular components. Greatest diameter is 4.5 cm.  Findings suggesting hepatic cirrhosis  No other acute intra-abdominal process  Electronically Signed  By: TrDaryll Brod.D.  On: 01/06/2014 10:49  No h/o cirrhosis in past, no significant EtOH use. + known h/o fatty liver in past. Pt endorses h/o Hep B infection in past (although may have been Hep A - "non contagious type". Did complete Hep B series 1990.  Past due [facility] colon[facility]  Wt Readings from Last 3 Encounters:  01/15/14 295 lb 8 oz (134.038 kg)  12/19/13 293 lb (132.904 kg)  06/24/13 304 lb 12 oz (138.234 kg)   Body mass index is 46.27 kg/(m^2).  Relevant past medical, surgical, family and social history reviewed and updated as indicated.  Allergies and medications reviewed and updated. Current Outpatient Prescriptions on File Prior to  Visit  Medication Sig  . albuterol (PROVENTIL) (2.5 MG/3ML) 0.083% nebulizer solution USE 1 VIAL PER NEBULIZER EVERY 6 HRS AS NEEDED FOR WHEEZING  . aspirin 325 MG tablet Take 325 mg by mouth daily.    . Blood Glucose Monitoring Suppl (ONE TOUCH ULTRA SYSTEM KIT) W/DEVICE KIT 1 kit by Does not apply route once.  . Cholecalciferol (VITAMIN D) 2000 UNITS CAPS Take 1 capsule (2,000 Units total) by mouth daily.  . clindamycin (CLEOCIN T) 1 % external solution Apply topically 2 (two) times daily.  . Marland Kitchenoxycycline (VIBRAMYCIN) 100 MG capsule Take 100 mg by mouth 2 (two) times daily.  . DULoxetine (CYMBALTA) 60 MG capsule TAKE ONE CAPSULE BY MOUTH EVERY DAY  . erythromycin with ethanol (EMGEL) 2 % gel Apply topically Daily.   . febuxostat (ULORIC) 40 MG tablet Take 1 tablet (40 mg total) by mouth daily. Allopurinol intolerant  . fluticasone (FLONASE) 50 MCG/ACT nasal spray   . folic acid (FOLVITE) 1 MG tablet TAKE 1 TABLET BY MOUTH ONCE A DAY  . irbesartan (AVAPRO) 75 MG tablet Take 1 tablet (75 mg total) by mouth daily.  . metFORMIN (GLUCOPHAGE) 500 MG tablet Take 0.5 tablets (250 mg total) by mouth at bedtime.  . metoprolol succinate (TOPROL-XL) 50 MG 24 hr tablet TAKE 1/2 TABLET EVERY DAY  . mirtazapine (REMERON) 30 MG tablet Take 30 mg by mouth at bedtime.  . Marland Kitchenorphine (MSIR) 30 MG tablet Take 30 mg by mouth 5 (five) times daily.  . nitroGLYCERIN (NITROSTAT) 0.4 MG SL tablet Place 1 tablet (0.4  mg total) under the tongue every 5 (five) minutes as needed for chest pain.  . nortriptyline (PAMELOR) 50 MG capsule Take 2 capsules (100 mg total) by mouth at bedtime.  . vitamin B-12 (CYANOCOBALAMIN) 500 MCG tablet Take 500 mcg by mouth daily.   No current facility-administered medications on file prior to visit.    Review of Systems Per HPI unless specifically indicated above    Objective:    BP 116/64  Pulse 80  Temp(Src) 98.3 F (36.8 C) (Oral)  Wt 295 lb 8 oz (134.038 kg)  Physical Exam    Nursing note and vitals reviewed. Constitutional: He appears well-developed and well-nourished. No distress.  HENT:  Mouth/Throat: Oropharynx is clear and moist. No oropharyngeal exudate.  Eyes: Conjunctivae and EOM are normal. Pupils are equal, round, and reactive to light.  Neck: Normal range of motion. Neck supple. No thyromegaly present.  Cardiovascular: Normal rate, regular rhythm, normal heart sounds and intact distal pulses.   No murmur heard. distant  Pulmonary/Chest: Effort normal and breath sounds normal. No respiratory distress. He has no wheezes. He has no rales.  Abdominal: Soft. Bowel sounds are normal. He exhibits no distension and no mass. There is hepatomegaly. There is no splenomegaly. There is no tenderness. There is no rigidity, no rebound, no guarding and negative Murphy's sign.  obese  Musculoskeletal: He exhibits no edema.  Lymphadenopathy:    He has no cervical adenopathy.  Skin: Skin is warm and dry. No rash noted.  Psychiatric: He has a normal mood and affect.   Results for orders placed in visit on 01/12/14  CBC WITH DIFFERENTIAL      Result Value Ref Range   WBC 9.1  4.0 - 10.5 K/uL   RBC 4.37  4.22 - 5.81 Mil/uL   Hemoglobin 14.4  13.0 - 17.0 g/dL   HCT 43.3  39.0 - 52.0 %   MCV 99.0  78.0 - 100.0 fl   MCHC 33.4  30.0 - 36.0 g/dL   RDW 15.5  11.5 - 15.5 %   Platelets 165.0  150.0 - 400.0 K/uL   Neutrophils Relative % 68.3  43.0 - 77.0 %   Lymphocytes Relative 25.1  12.0 - 46.0 %   Monocytes Relative 3.6  3.0 - 12.0 %   Eosinophils Relative 2.7  0.0 - 5.0 %   Basophils Relative 0.3  0.0 - 3.0 %   Neutro Abs 6.2  1.4 - 7.7 K/uL   Lymphs Abs 2.3  0.7 - 4.0 K/uL   Monocytes Absolute 0.3  0.1 - 1.0 K/uL   Eosinophils Absolute 0.2  0.0 - 0.7 K/uL   Basophils Absolute 0.0  0.0 - 0.1 K/uL  VITAMIN D 25 HYDROXY      Result Value Ref Range   VITD 16.85 (*) 30.00 - 100.00 ng/mL  LIPID PANEL      Result Value Ref Range   Cholesterol 180  0 - 200 mg/dL    Triglycerides 195.0 (*) 0.0 - 149.0 mg/dL   HDL 32.20 (*) >39.00 mg/dL   VLDL 39.0  0.0 - 40.0 mg/dL   LDL Cholesterol 109 (*) 0 - 99 mg/dL   Total CHOL/HDL Ratio 6     NonHDL 147.80    HEPATIC FUNCTION PANEL      Result Value Ref Range   Total Bilirubin 0.7  0.2 - 1.2 mg/dL   Bilirubin, Direct 0.2  0.0 - 0.3 mg/dL   Alkaline Phosphatase 109  39 - 117 U/L   AST  30  0 - 37 U/L   ALT 22  0 - 53 U/L   Total Protein 7.1  6.0 - 8.3 g/dL   Albumin 3.2 (*) 3.5 - 5.2 g/dL  PROTIME-INR      Result Value Ref Range   INR 1.1 (*) 0.8 - 1.0 ratio   Prothrombin Time 11.7  9.6 - 13.1 sec  HEMOGLOBIN A1C      Result Value Ref Range   Hemoglobin A1C 6.1  4.6 - 6.5 %  PSA      Result Value Ref Range   PSA 0.19  0.10 - 4.00 ng/mL  URIC ACID      Result Value Ref Range   Uric Acid, Serum 5.5  4.0 - 7.8 mg/dL  HEPATITIS PANEL, ACUTE      Result Value Ref Range   Hepatitis B Surface Ag NEGATIVE  NEGATIVE   HCV Ab NEGATIVE  NEGATIVE   Hep B C IgM NON REACTIVE  NON REACTIVE   Hep A IgM NON REACTIVE  NON REACTIVE  HEPATITIS A ANTIBODY, TOTAL      Result Value Ref Range   Hep A Total Ab REACTIVE (*) NON REACTIVE      Assessment & Plan:   Problem List Items Addressed This Visit   Chronic pain     Appreciate pain clinic care of patient.    Relevant Medications      morphine (MS CONTIN) 30 MG 12 hr tablet   OSA (obstructive sleep apnea)     Endorses told he did not need CPAP.    Diabetes type 2, controlled     Chronic, stable on low dose metformin - continue.    Vitamin D deficiency     rec regular 2000 IU vit D use.    Liver cirrhosis secondary to NASH - Primary     Reviewed dx and pathophysiology. Questions answered. UTD Hep B shots. Anticipate exposed to Hep A in past. Anticipate current cirrhosis more likely due to fatty liver - NASH. LFTs along with other labwork stable currently - discussed need for routine monitoring. Intolerant to statins.  Pt education handout provided  today. Discussed avoiding tylenol and EtOH, discussed hepatically dosing meds. Pt has already notified pain clinic of dx.        Follow up plan: Return as needed, for medicare wellness visit.

## 2014-01-15 NOTE — Assessment & Plan Note (Signed)
rec regular 2000 IU vit D use.

## 2014-01-15 NOTE — Patient Instructions (Addendum)
Return in 3-4 months for medicare wellness visit, no need for fasting labwork prior. Good to see you today, call us with questions. Start miralax daily for constipation - and let me know if not helping.  Cirrhosis Cirrhosis is a condition of scarring of the liver which is caused when the liver has tried repairing itself following damage. This damage may come from a previous infection such as one of the forms of hepatitis (usually hepatitis C), or the damage may come from being injured by toxins. The main toxin that causes this damage is alcohol. The scarring of the liver from use of alcohol is irreversible. That means the liver cannot return to normal even though alcohol is not used any more. The main danger of hepatitis C infection is that it may cause long-lasting (chronic) liver disease, and this also may lead to cirrhosis. This complication is progressive and irreversible. CAUSES  Prior to available blood tests, hepatitis C could be contracted by blood transfusions. Since testing of blood has improved, this is now unlikely. This infection can also be contracted through intravenous drug use and the sharing of needles. It can also be contracted through sexual relationships. The injury caused by alcohol comes from too much use. It is not a few drinks that poison the liver, but years of misuse. Usually there will be some signs and symptoms early with scarring of the liver that suggest the development of better habits. Alcohol should never be used while using acetaminophen. A small dose of both taken together may cause irreversible damage to the liver. HOME CARE INSTRUCTIONS  There is no specific treatment for cirrhosis. However, there are things you can do to avoid making the condition worse.  Rest as needed.  Eat a well-balanced diet. Your caregiver can help you with suggestions.  Vitamin supplements including vitamins A, K, D, and thiamine can help.  A low-salt diet, water restriction, or diuretic  medicine may be needed to reduce fluid retention.  Avoid alcohol. This can be extremely toxic if combined with acetaminophen.  Avoid drugs which are toxic to the liver. Some of these include isoniazid, methyldopa, acetaminophen, anabolic steroids (muscle-building drugs), erythromycin, and oral contraceptives (birth control pills). Check with your caregiver to make sure medicines you are presently taking will not be harmful.  Periodic blood tests may be required. Follow your caregiver's advice regarding the timing of these.  Milk thistle is an herbal remedy which does protect the liver against toxins. However, it will not help once the liver has been scarred. SEEK MEDICAL CARE IF:  You have increasing fatigue or weakness.  You develop swelling of the hands, feet, legs, or face.  You vomit bright red blood, or a coffee ground appearing material.  You have blood in your stools, or the stools turn black and tarry.  You have a fever.  You develop loss of appetite, or have nausea and vomiting.  You develop jaundice.  You develop easy bruising or bleeding.  You have worsening of any of the problems you are concerned about. Document Released: 05/22/2005 Document Revised: 08/14/2011 Document Reviewed: 01/08/2008 [facility] Patient Information 2015 [place], [place]. This information is not intended to replace advice given to you by your health care provider. Make sure you discuss any questions you have with your health care provider.

## 2014-01-15 NOTE — Assessment & Plan Note (Signed)
Endorses told he did not need CPAP.

## 2014-01-15 NOTE — Assessment & Plan Note (Addendum)
Reviewed dx and pathophysiology. Questions answered. UTD Hep B shots. Anticipate exposed to Hep A in past. Anticipate current cirrhosis more likely due to fatty liver - NASH. LFTs along with other labwork stable currently - discussed need for routine monitoring. Intolerant to statins.  Pt education handout provided today. Discussed avoiding tylenol and EtOH, discussed hepatically dosing meds. Pt has already notified pain clinic of dx.

## 2014-01-15 NOTE — Assessment & Plan Note (Signed)
Chronic, stable on low dose metformin - continue.

## 2014-01-15 NOTE — Assessment & Plan Note (Signed)
Appreciate pain clinic care of patient.

## 2014-01-18 ENCOUNTER — Other Ambulatory Visit: Payer: Medicare PPO

## 2014-01-19 ENCOUNTER — Ambulatory Visit
Admission: RE | Admit: 2014-01-19 | Discharge: 2014-01-19 | Disposition: A | Discharge status: Home / Self Care | Payer: Medicare PPO | Source: Ambulatory Visit | Attending: Pain Medicine | Admitting: Pain Medicine

## 2014-01-19 DIAGNOSIS — M545 Low back pain, unspecified: Secondary | ICD-10-CM

## 2014-02-04 ENCOUNTER — Encounter: Payer: Medicare PPO | Admitting: Family Medicine

## 2014-02-11 ENCOUNTER — Telehealth: Payer: Self-pay | Admitting: Family Medicine

## 2014-02-11 NOTE — Telephone Encounter (Signed)
Pt dropped of form to apply for disability placard. Put in inbox

## 2014-02-11 NOTE — Telephone Encounter (Signed)
Filled and placed in my out box.

## 2014-02-11 NOTE — Telephone Encounter (Signed)
In your IN box for completion.  

## 2014-03-13 ENCOUNTER — Encounter: Payer: Self-pay | Admitting: Family Medicine

## 2014-03-13 ENCOUNTER — Ambulatory Visit (INDEPENDENT_AMBULATORY_CARE_PROVIDER_SITE_OTHER): Payer: Medicare PPO | Admitting: Family Medicine

## 2014-03-13 VITALS — BP 118/78 | HR 80 | Temp 98.0°F | Wt 301.5 lb

## 2014-03-13 DIAGNOSIS — I951 Orthostatic hypotension: Secondary | ICD-10-CM

## 2014-03-13 DIAGNOSIS — L732 Hidradenitis suppurativa: Secondary | ICD-10-CM

## 2014-03-13 MED ORDER — DOXYCYCLINE HYCLATE 100 MG PO CAPS: 100.0000 mg | ORAL_CAPSULE | Freq: Two times a day (BID) | ORAL | Status: DC

## 2014-03-13 MED ORDER — METOPROLOL SUCCINATE ER 25 MG PO TB24: ORAL_TABLET | ORAL | Status: DC

## 2014-03-13 MED ORDER — IRBESARTAN 75 MG PO TABS: 37.5000 mg | ORAL_TABLET | Freq: Every day | ORAL | Status: DC

## 2014-03-13 NOTE — Progress Notes (Signed)
Pre visit review using our clinic review tool, if applicable. No additional management support is needed unless otherwise documented below in the visit note. 

## 2014-03-13 NOTE — Patient Instructions (Addendum)
Doxycycline course for 7 days. Use warm compresses to area. Push fluids as you are dehydrated. Decrease metoprolol XL to 71m 1/2 tablet daily (new dose sent to pharmacy) Decrease irbesartan (avapro) to 1/2 tablet daily (37.559mdaily). Let me know if not better with these changes.

## 2014-03-13 NOTE — Assessment & Plan Note (Signed)
Recurrence - doxy 7d course, warm compresses to active area. Update [place] with effect. Pt asks about humira for hydradenitis, I rec f/u with derm regarding this.

## 2014-03-13 NOTE — Assessment & Plan Note (Addendum)
New, orthostatic vitals positive on exam today. Some mild dehydration - encourage push fluids. Able to tolerate POs, nontoxic today, no concern for sepsis. ?morphine related, now about to start fentanyl patch. Will decrease antihypertensives - to toprol xl 12.97m daily and avapro 37.560mdaily.  Update if persistent orthostasis. Pt agrees with plan. Will forward today's note to [facility] to be aware.

## 2014-03-13 NOTE — Progress Notes (Signed)
BP 118/78  Pulse 80  Temp(Src) 98 F (36.7 C) (Oral)  Wt 301 lb 8 oz (136.76 kg)   CC: dizziness  Subjective:    Patient ID: [name], male    DOB: 09-11-1955, 58 y.o.   MRN: [id number]  HPI: [name] is a 58 y.o. male presenting on 03/13/2014 for Dizziness   2d h/o dizziness worse with standing. Not drinking as much water as he should.   HTN - Compliant with current antihypertensive regimen of irbesartan 52m daily and toprol xl 531m1/2 tablet daily.  irbesartan started 12/2013. H/o ICM and CAD s/p stents. Does check blood pressures at home: yesterday very low - 85/34 with presyncopal sxs. Denies HA, vision changes, CP/tightness, SOB, leg swelling.   BP Readings from Last 3 Encounters:  03/13/14 118/78  01/15/14 116/64  12/19/13 118/90   Lab Results  Component Value Date   HGBA1C 6.1 01/12/2014   Wt Readings from Last 3 Encounters:  03/13/14 301 lb 8 oz (136.76 kg)  01/15/14 295 lb 8 oz (134.038 kg)  12/19/13 293 lb (132.904 kg)    Orthostatics positive today - sitting 118/78 HR 80, standing 90/60 HR 96  New pain regimen per pain clinic - MSIR 3084mp to 5x/day and about to start fentanyl patch  Also worried about flare of hydradenitis. Stopped ppx doxy for last 6 mo. Still using clindamycin soln. No fevers/chills. Staying fatigued.  Relevant past medical, surgical, family and social history reviewed and updated as indicated.  Allergies and medications reviewed and updated. Current Outpatient Prescriptions on File Prior to Visit  Medication Sig  . albuterol (PROVENTIL) (2.5 MG/3ML) 0.083% nebulizer solution USE 1 VIAL PER NEBULIZER EVERY 6 HRS AS NEEDED FOR WHEEZING  . aspirin 325 MG tablet Take 325 mg by mouth daily.    . Cholecalciferol (VITAMIN D) 2000 UNITS CAPS Take 1 capsule (2,000 Units total) by mouth daily.  . clindamycin (CLEOCIN T) 1 % external solution Apply topically 2 (two) times daily.  . DULoxetine (CYMBALTA) 60 MG capsule TAKE ONE  CAPSULE BY MOUTH EVERY DAY  . erythromycin with ethanol (EMGEL) 2 % gel Apply topically Daily.   . febuxostat (ULORIC) 40 MG tablet Take 1 tablet (40 mg total) by mouth daily. Allopurinol intolerant  . fluticasone (FLONASE) 50 MCG/ACT nasal spray   . folic acid (FOLVITE) 1 MG tablet TAKE 1 TABLET BY MOUTH ONCE A DAY  . metFORMIN (GLUCOPHAGE) 500 MG tablet Take 0.5 tablets (250 mg total) by mouth at bedtime.  . mirtazapine (REMERON) 30 MG tablet Take 30 mg by mouth at bedtime.  . mMarland Kitchenrphine (MSIR) 30 MG tablet Take 30 mg by mouth 5 (five) times daily.  . nitroGLYCERIN (NITROSTAT) 0.4 MG SL tablet Place 1 tablet (0.4 mg total) under the tongue every 5 (five) minutes as needed for chest pain.  . nortriptyline (PAMELOR) 50 MG capsule Take 2 capsules (100 mg total) by mouth at bedtime.  . polyethylene glycol powder (GLYCOLAX/MIRALAX) powder Take 17 g by mouth daily as needed for moderate constipation.  . vitamin B-12 (CYANOCOBALAMIN) 500 MCG tablet Take 500 mcg by mouth daily.  . Blood Glucose Monitoring Suppl (ONE TOUCH ULTRA SYSTEM KIT) W/DEVICE KIT 1 kit by Does not apply route once.  . gMarland Kitchenucose blood (ONE TOUCH ULTRA TEST) test strip Use as instructed.  250.00   No current facility-administered medications on file prior to visit.    Review of Systems Per HPI unless specifically indicated above  Objective:    BP 118/78  Pulse 80  Temp(Src) 98 F (36.7 C) (Oral)  Wt 301 lb 8 oz (136.76 kg)  Physical Exam  Nursing note and vitals reviewed. Constitutional: He appears well-developed and well-nourished. No distress.  Morbidly obese  HENT:  Mouth/Throat: No oropharyngeal exudate.  Mildly dry MM  Cardiovascular: Normal rate, regular rhythm, normal heart sounds and intact distal pulses.   No murmur heard. Pulmonary/Chest: Effort normal and breath sounds normal. No respiratory distress. He has no wheezes. He has no rales.  Musculoskeletal: He exhibits no edema.  Skin: Skin is warm and dry.  No rash noted.  Skin thickening with induration and superficial skin sinuses present R groin from hydradenitis, tender area inner thigh. No marked erythema or warmth.   Results for orders placed in visit on 01/12/14  CBC WITH DIFFERENTIAL      Result Value Ref Range   WBC 9.1  4.0 - 10.5 K/uL   RBC 4.37  4.22 - 5.81 Mil/uL   Hemoglobin 14.4  13.0 - 17.0 g/dL   HCT 43.3  39.0 - 52.0 %   MCV 99.0  78.0 - 100.0 fl   MCHC 33.4  30.0 - 36.0 g/dL   RDW 15.5  11.5 - 15.5 %   Platelets 165.0  150.0 - 400.0 K/uL   Neutrophils Relative % 68.3  43.0 - 77.0 %   Lymphocytes Relative 25.1  12.0 - 46.0 %   Monocytes Relative 3.6  3.0 - 12.0 %   Eosinophils Relative 2.7  0.0 - 5.0 %   Basophils Relative 0.3  0.0 - 3.0 %   Neutro Abs 6.2  1.4 - 7.7 K/uL   Lymphs Abs 2.3  0.7 - 4.0 K/uL   Monocytes Absolute 0.3  0.1 - 1.0 K/uL   Eosinophils Absolute 0.2  0.0 - 0.7 K/uL   Basophils Absolute 0.0  0.0 - 0.1 K/uL  VITAMIN D 25 HYDROXY      Result Value Ref Range   VITD 16.85 (*) 30.00 - 100.00 ng/mL  LIPID PANEL      Result Value Ref Range   Cholesterol 180  0 - 200 mg/dL   Triglycerides 195.0 (*) 0.0 - 149.0 mg/dL   HDL 32.20 (*) >39.00 mg/dL   VLDL 39.0  0.0 - 40.0 mg/dL   LDL Cholesterol 109 (*) 0 - 99 mg/dL   Total CHOL/HDL Ratio 6     NonHDL 147.80    HEPATIC FUNCTION PANEL      Result Value Ref Range   Total Bilirubin 0.7  0.2 - 1.2 mg/dL   Bilirubin, Direct 0.2  0.0 - 0.3 mg/dL   Alkaline Phosphatase 109  39 - 117 U/L   AST 30  0 - 37 U/L   ALT 22  0 - 53 U/L   Total Protein 7.1  6.0 - 8.3 g/dL   Albumin 3.2 (*) 3.5 - 5.2 g/dL  PROTIME-INR      Result Value Ref Range   INR 1.1 (*) 0.8 - 1.0 ratio   Prothrombin Time 11.7  9.6 - 13.1 sec  HEMOGLOBIN A1C      Result Value Ref Range   Hemoglobin A1C 6.1  4.6 - 6.5 %  PSA      Result Value Ref Range   PSA 0.19  0.10 - 4.00 ng/mL  URIC ACID      Result Value Ref Range   Uric Acid, Serum 5.5  4.0 - 7.8 mg/dL  HEPATITIS PANEL,  ACUTE  Result Value Ref Range   Hepatitis B Surface Ag NEGATIVE  NEGATIVE   HCV Ab NEGATIVE  NEGATIVE   Hep B C IgM NON REACTIVE  NON REACTIVE   Hep A IgM NON REACTIVE  NON REACTIVE  HEPATITIS A ANTIBODY, TOTAL      Result Value Ref Range   Hep A Total Ab REACTIVE (*) NON REACTIVE      Assessment & Plan:   Problem List Items Addressed This Visit   Orthostatic hypotension - Primary     New, orthostatic vitals positive on exam today. Some mild dehydration - encourage push fluids. Able to tolerate POs, nontoxic today, no concern for sepsis. ?morphine related, now about to start fentanyl patch. Will decrease antihypertensives - to toprol xl 12.6m daily and avapro 37.544mdaily.  Update if persistent orthostasis. Pt agrees with plan. Will forward today's note to [facility] to be aware.    Relevant Medications      irbesartan (AVAPRO) tablet      metoprolol succinate (TOPROL-XL) 24 hr tablet   Hidradenitis (Chronic)     Recurrence - doxy 7d course, warm compresses to active area. Update [place] effect. Pt asks about humira for hydradenitis, I rec f/u with derm regarding this.        Follow up plan: Return if symptoms worsen or fail to improve.

## 2014-03-25 ENCOUNTER — Other Ambulatory Visit: Payer: Self-pay | Admitting: Diagnostic Neuroimaging

## 2014-04-21 ENCOUNTER — Ambulatory Visit (INDEPENDENT_AMBULATORY_CARE_PROVIDER_SITE_OTHER): Payer: Medicare PPO | Admitting: Family Medicine

## 2014-04-21 ENCOUNTER — Encounter: Payer: Self-pay | Admitting: Family Medicine

## 2014-04-21 VITALS — BP 126/84 | HR 104 | Temp 98.2°F | Ht 67.25 in | Wt 307.5 lb

## 2014-04-21 DIAGNOSIS — G8929 Other chronic pain: Secondary | ICD-10-CM

## 2014-04-21 DIAGNOSIS — I714 Abdominal aortic aneurysm, without rupture, unspecified: Secondary | ICD-10-CM

## 2014-04-21 DIAGNOSIS — Z0000 Encounter for general adult medical examination without abnormal findings: Secondary | ICD-10-CM

## 2014-04-21 DIAGNOSIS — Z7189 Other specified counseling: Secondary | ICD-10-CM | POA: Insufficient documentation

## 2014-04-21 DIAGNOSIS — F17200 Nicotine dependence, unspecified, uncomplicated: Secondary | ICD-10-CM

## 2014-04-21 DIAGNOSIS — M109 Gout, unspecified: Secondary | ICD-10-CM

## 2014-04-21 DIAGNOSIS — K7460 Unspecified cirrhosis of liver: Secondary | ICD-10-CM

## 2014-04-21 DIAGNOSIS — I10 Essential (primary) hypertension: Secondary | ICD-10-CM

## 2014-04-21 DIAGNOSIS — E785 Hyperlipidemia, unspecified: Secondary | ICD-10-CM

## 2014-04-21 DIAGNOSIS — Z1211 Encounter for screening for malignant neoplasm of colon: Secondary | ICD-10-CM

## 2014-04-21 DIAGNOSIS — K7581 Nonalcoholic steatohepatitis (NASH): Secondary | ICD-10-CM

## 2014-04-21 DIAGNOSIS — Z23 Encounter for immunization: Secondary | ICD-10-CM

## 2014-04-21 DIAGNOSIS — E119 Type 2 diabetes mellitus without complications: Secondary | ICD-10-CM

## 2014-04-21 DIAGNOSIS — Z0001 Encounter for general adult medical examination with abnormal findings: Secondary | ICD-10-CM | POA: Insufficient documentation

## 2014-04-21 NOTE — Progress Notes (Signed)
Pre visit review using our clinic review tool, if applicable. No additional management support is needed unless otherwise documented below in the visit note. 

## 2014-04-21 NOTE — Assessment & Plan Note (Signed)
Will refer to GI to establish per pt/wife request.  LFTs stable, plt normal, Hep A/B status immune. Intolerant to statins.

## 2014-04-21 NOTE — Assessment & Plan Note (Signed)
Chronic, stable. Continue current dose of antihypertensive.

## 2014-04-21 NOTE — Assessment & Plan Note (Signed)
Intolerant of statins in past. Continue monitoring diet. LD L109 on last check.

## 2014-04-21 NOTE — Addendum Note (Signed)
Addended by: Royann Shivers A on: 04/21/2014 11:38 AM   Modules accepted: Orders

## 2014-04-21 NOTE — Assessment & Plan Note (Signed)
Rpt CTA due 01/2014.

## 2014-04-21 NOTE — Assessment & Plan Note (Signed)
Reviewed increasing trend, encouraged watch portion sizes.

## 2014-04-21 NOTE — Progress Notes (Signed)
BP 126/84 mmHg  Pulse 104  Temp(Src) 98.2 F (36.8 C) (Oral)  Ht 5' 7.25" (1.708 m)  Wt 307 lb 8 oz (139.481 kg)  BMI 47.81 kg/m2   CC: medicare wellness visit  Subjective:    Patient ID: [name], male    DOB: 11/06/1955, 58 y.o.   MRN: [id number]  HPI: [name] is a 58 y.o. male presenting on 04/21/2014 for Annual Exam   AAA (abdominal aortic aneurysm) Date: 01/2014 CTA 4.5cm followed by Dr Stanford Breed  Requests higher seat for commode. Has shower chair at home.   Smoking 1 ppd. precontemplative.   Wt Readings from Last 3 Encounters:  04/21/14 307 lb 8 oz (139.481 kg)  03/13/14 301 lb 8 oz (136.76 kg)  01/15/14 295 lb 8 oz (134.038 kg)  Body mass index is 47.81 kg/(m^2).   Hearing screen normal today  Vision screen failed on right 20/70 - pt planning on seeing eye doctor next month  Falls - at risk due to [place]  Denies depression/anhedonia, sadness   Preventative: Prostate cancer screening - PSA WNL 01/2014 COLONOSCOPY Date: 05/06/2007 normal, small int hemorrhoids rpt 5 yrs due to fmhx. Agrees to return to Dr. Collene Mares. Flu shot today Tetanus 2007 Pneumovax 2007 Advanced directives: wants prolonged life support. Wants wife to be HCPOA but not formally set up.  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  Allergies and medications reviewed and updated. Current Outpatient Prescriptions on File Prior to Visit  Medication Sig  . albuterol (PROVENTIL) (2.5 MG/3ML) 0.083% nebulizer solution USE 1 VIAL PER NEBULIZER EVERY 6 HRS AS NEEDED FOR WHEEZING  . aspirin 325 MG tablet Take 325 mg by mouth daily.    . Cholecalciferol (VITAMIN D) 2000 UNITS CAPS Take 1 capsule (2,000 Units total) by mouth daily.    . clindamycin (CLEOCIN T) 1 % external solution Apply topically 2 (two) times daily.  . DULoxetine (CYMBALTA) 60 MG capsule TAKE ONE CAPSULE BY MOUTH EVERY DAY  . febuxostat (ULORIC) 40 MG tablet Take 1 tablet (40 mg total) by mouth daily. Allopurinol intolerant  . fentaNYL (DURAGESIC - DOSED MCG/HR) 25 MCG/HR patch Place 25 mcg onto the skin every 3 (three) days.  . fluticasone (FLONASE) 50 MCG/ACT nasal spray   . folic acid (FOLVITE) 1 MG tablet TAKE 1 TABLET BY MOUTH ONCE A DAY  . irbesartan (AVAPRO) 75 MG tablet Take 0.5 tablets (37.5 mg total) by mouth daily.  . metFORMIN (GLUCOPHAGE) 500 MG tablet Take 0.5 tablets (250 mg total) by mouth at bedtime.  . metoprolol succinate (TOPROL-XL) 25 MG 24 hr tablet TAKE 1/2 TABLET EVERY DAY  . mirtazapine (REMERON) 30 MG tablet Take 30 mg by mouth at bedtime.  [name] morphine (MSIR) 30 MG tablet Take 30 mg by mouth 5 (five) times daily.  . nortriptyline (PAMELOR) 50 MG capsule Take 2 capsules (100 mg total) by mouth at bedtime.  . polyethylene glycol powder (GLYCOLAX/MIRALAX) powder Take 17 g by mouth daily as needed for moderate constipation.  . vitamin B-12 (CYANOCOBALAMIN) 500 MCG tablet Take 500 mcg by mouth daily.  . Blood Glucose Monitoring Suppl (ONE TOUCH ULTRA SYSTEM KIT) W/DEVICE KIT 1 kit by Does not apply route once.  . doxycycline (VIBRAMYCIN) 100 MG capsule Take 1  capsule (100 mg total) by mouth 2 (two) times daily.  [name] erythromycin with ethanol (EMGEL) 2 % gel Apply topically Daily.   [name] glucose blood (ONE TOUCH ULTRA TEST) test strip Use as instructed.  250.00  . nitroGLYCERIN (NITROSTAT) 0.4 MG SL tablet Place 1 tablet (0.4 mg total) under the tongue every 5 (five) minutes as needed for chest pain.   No current facility-administered medications on file prior to visit.    Review of Systems  Constitutional: Negative for fever, chills, activity change, appetite change, fatigue and unexpected weight change.  HENT: Negative for hearing  loss.   Eyes: Negative for visual disturbance.  Respiratory: Positive for cough (smoker's cough). Negative for chest tightness, shortness of breath and wheezing.   Cardiovascular: Negative for chest pain, palpitations and leg swelling.  Gastrointestinal: Positive for abdominal pain (some heartburn). Negative for nausea, vomiting, diarrhea, constipation, blood in stool and abdominal distention.  Genitourinary: Negative for hematuria and difficulty urinating.  Musculoskeletal: Negative for myalgias, arthralgias and neck pain.  Skin: Negative for rash.  Neurological: Positive for dizziness. Negative for seizures, syncope and headaches.  Hematological: Negative for adenopathy. Does not bruise/bleed easily.  Psychiatric/Behavioral: Negative for dysphoric mood. The patient is not nervous/anxious.    Per HPI unless specifically indicated above    Objective:    BP 126/84 mmHg  Pulse 104  Temp(Src) 98.2 F (36.8 C) (Oral)  Ht 5' 7.25" (1.708 m)  Wt 307 lb 8 oz (139.481 kg)  BMI 47.81 kg/m2  Physical Exam  Constitutional: He is oriented to person, place, and time. He appears well-developed and well-nourished. No distress.  Morbidly obese, walks with cane  HENT:  Head: Normocephalic and atraumatic.  Right Ear: Hearing, tympanic membrane, external ear and ear canal normal.  Left Ear: Hearing, tympanic membrane, external ear and ear canal normal.  Nose: Nose normal.  Mouth/Throat: Uvula is midline, oropharynx is clear and moist and mucous membranes are normal. No oropharyngeal exudate, posterior oropharyngeal edema or posterior oropharyngeal erythema.  Eyes: Conjunctivae and EOM are normal. Pupils are equal, round, and reactive to light. No scleral icterus.  Neck: Normal range of motion. Neck supple. Carotid bruit is not present. No thyromegaly present.  Cardiovascular: Normal rate, regular rhythm, normal heart sounds and intact distal pulses.   No murmur heard. Pulses:      Radial pulses are  2+ on the right side, and 2+ on the left side.  Pulmonary/Chest: Effort normal and breath sounds normal. No respiratory distress. He has no wheezes. He has no rales.  Abdominal: Soft. Bowel sounds are normal. He exhibits distension. He exhibits no mass. There is tenderness (mild) in the right upper quadrant. There is no rebound and no guarding.  Genitourinary: Rectum normal and prostate normal. Rectal exam shows no external hemorrhoid, no internal hemorrhoid, no fissure, no mass, no tenderness and anal tone normal. Prostate is not enlarged and not tender.  Some perianal nodules present  Musculoskeletal: Normal range of motion. He exhibits no edema.  Lymphadenopathy:    He has no cervical adenopathy.  Neurological: He is alert and oriented to person, place, and time.  CN grossly intact, station and gait intact Recall 2/3, 3/3 with cue Calculation 2/5 serial 3s 3/5 [id number]  Skin: Skin is warm and dry. No rash noted.  Psychiatric: He has a normal mood and affect. His behavior is normal. Judgment and thought content normal.  Nursing note and vitals reviewed.  Results for orders placed or performed in visit on 01/12/14  CBC with Differential  Result Value Ref Range   WBC 9.1 4.0 - 10.5 K/uL   RBC 4.37 4.22 - 5.81 Mil/uL   Hemoglobin 14.4 13.0 - 17.0 g/dL   HCT 43.3 39.0 - 52.0 %   MCV 99.0 78.0 - 100.0 fl   MCHC 33.4 30.0 - 36.0 g/dL   RDW 15.5 11.5 - 15.5 %   Platelets 165.0 150.0 - 400.0 K/uL   Neutrophils Relative % 68.3 43.0 - 77.0 %   Lymphocytes Relative 25.1 12.0 - 46.0 %   Monocytes Relative 3.6 3.0 - 12.0 %   Eosinophils Relative 2.7 0.0 - 5.0 %   Basophils Relative 0.3 0.0 - 3.0 %   Neutro Abs 6.2 1.4 - 7.7 K/uL   Lymphs Abs 2.3 0.7 - 4.0 K/uL   Monocytes Absolute 0.3 0.1 - 1.0 K/uL   Eosinophils Absolute 0.2 0.0 - 0.7 K/uL   Basophils Absolute 0.0 0.0 - 0.1 K/uL  Vit D  25 hydroxy (rtn osteoporosis monitoring)  Result Value Ref Range   VITD 16.85 (L) 30.00 - 100.00  ng/mL  Lipid panel  Result Value Ref Range   Cholesterol 180 0 - 200 mg/dL   Triglycerides 195.0 (H) 0.0 - 149.0 mg/dL   HDL 32.20 (L) >39.00 mg/dL   VLDL 39.0 0.0 - 40.0 mg/dL   LDL Cholesterol 109 (H) 0 - 99 mg/dL   Total CHOL/HDL Ratio 6    NonHDL 147.80   Hepatic function panel  Result Value Ref Range   Total Bilirubin 0.7 0.2 - 1.2 mg/dL   Bilirubin, Direct 0.2 0.0 - 0.3 mg/dL   Alkaline Phosphatase 109 39 - 117 U/L   AST 30 0 - 37 U/L   ALT 22 0 - 53 U/L   Total Protein 7.1 6.0 - 8.3 g/dL   Albumin 3.2 (L) 3.5 - 5.2 g/dL  Protime-INR  Result Value Ref Range   INR 1.1 (H) 0.8 - 1.0 ratio   Prothrombin Time 11.7 9.6 - 13.1 sec  Hemoglobin A1c  Result Value Ref Range   Hgb A1c MFr Bld 6.1 4.6 - 6.5 %  PSA  Result Value Ref Range   PSA 0.19 0.10 - 4.00 ng/mL  Uric acid  Result Value Ref Range   Uric Acid, Serum 5.5 4.0 - 7.8 mg/dL  Hepatitis panel, acute  Result Value Ref Range   Hepatitis B Surface Ag NEGATIVE NEGATIVE   HCV Ab NEGATIVE NEGATIVE   Hep B C IgM NON REACTIVE NON REACTIVE   Hep A IgM NON REACTIVE NON REACTIVE  Hepatitis A antibody, total  Result Value Ref Range   Hep A Total Ab REACTIVE (A) NON REACTIVE      Assessment & Plan:   Problem List Items Addressed This Visit    TOBACCO ABUSE    Continue to encourage cessation. precontemplative.    Morbid obesity    Reviewed increasing trend, encouraged watch portion sizes.    Medicare annual wellness visit, subsequent - Primary    I have personally reviewed the Medicare Annual Wellness questionnaire and have noted 1. The patient's medical and social history 2. Their use of alcohol, tobacco or illicit drugs 3. Their current medications and supplements 4. The patient's functional ability including ADL's, fall risks, home safety risks and hearing or visual impairment. 5. Diet and physical activity 6. Evidence for depression or mood disorders The patients weight, height, BMI have been recorded in the  chart.  Hearing and vision has been addressed. I have made  referrals, counseling and provided education to the patient based review of the above and I have provided the pt with a written personalized care plan for preventive services. Provider list updated - see scanned questionairre.  Reviewed preventative protocols and updated unless pt declined.    Liver cirrhosis secondary to NASH    Will refer to GI to establish per pt/wife request.  LFTs stable, plt normal, Hep A/B status immune. Intolerant to statins.    Relevant Orders      Ambulatory referral to Gastroenterology   HLD (hyperlipidemia)    Intolerant of statins in past. Continue monitoring diet. LD L109 on last check.    Health maintenance examination    Preventative protocols reviewed and updated unless pt declined. Discussed healthy diet and lifestyle.     Gout    Noted increased flares but not regular with uloric. Advised restart taking urate lowering med regularly.    Essential hypertension (Chronic)    Chronic, stable. Continue current dose of antihypertensive.    Diabetes type 2, controlled    Well controlled on 1/2 tab metformin 525m as of last A1c.    Chronic pain    [facility] Appreciate their assistance.    Relevant Medications      OxyCODONE (OXYCONTIN) 10 mg T12A 12 hr tablet   Advanced care planning/counseling discussion    Advanced directives: wants prolonged life support. Wants wife to be HCPOA but not formally set up.    AAA (abdominal aortic aneurysm)    Rpt CTA due 01/2014.     Other Visit Diagnoses    Colon cancer screening        Relevant Orders       Ambulatory referral to Gastroenterology        Follow up plan: Return in about 4 months (around 08/20/2014), or as needed, for follow up visit.

## 2014-04-21 NOTE — Assessment & Plan Note (Signed)
Now established with [facility]. Appreciate their assistance.

## 2014-04-21 NOTE — Assessment & Plan Note (Signed)
Noted increased flares but not regular with uloric. Advised restart taking urate lowering med regularly.

## 2014-04-21 NOTE — Assessment & Plan Note (Signed)
Continue to encourage cessation. precontemplative.

## 2014-04-21 NOTE — Assessment & Plan Note (Signed)
Preventative protocols reviewed and updated unless pt declined. Discussed healthy diet and lifestyle.  

## 2014-04-21 NOTE — Patient Instructions (Addendum)
Flu shot today. Advanced directive packet provided today. We will refer you back to Dr. Collene Mares for follow up colonoscopy and to establish for liver disease. Good to see you today, call us with questions. Return to see me in 76month, sooner if needed.

## 2014-04-21 NOTE — Assessment & Plan Note (Signed)
Advanced directives: wants prolonged life support. Wants wife to be HCPOA but not formally set up.

## 2014-04-21 NOTE — Assessment & Plan Note (Signed)

## 2014-04-21 NOTE — Assessment & Plan Note (Signed)
Well controlled on 1/2 tab metformin 562m as of last A1c.

## 2014-04-23 ENCOUNTER — Telehealth: Payer: Self-pay | Admitting: Family Medicine

## 2014-04-23 NOTE — Telephone Encounter (Signed)
emmi emailed °

## 2014-05-01 ENCOUNTER — Other Ambulatory Visit: Payer: Self-pay | Admitting: Diagnostic Neuroimaging

## 2014-05-11 ENCOUNTER — Other Ambulatory Visit: Payer: Self-pay | Admitting: Family Medicine

## 2014-05-11 NOTE — Telephone Encounter (Signed)
Do you want to take over prescribing this? [facility] advised he needed follow up with them or to have you prescribe.

## 2014-05-12 NOTE — Telephone Encounter (Signed)
Refilled

## 2014-05-14 ENCOUNTER — Telehealth: Payer: Self-pay | Admitting: Family Medicine

## 2014-05-14 NOTE — Telephone Encounter (Signed)
Patient's wife called and said patient's blood pressure this morning was 146/111 and she took it again and it was 147/102.  Patient's wife said patient is upset because she had knee replacement surgery and she's giving him a hard time.  Patient's wife said patient doesn't always take his medication during the day and he's refusing to come to the doctor.  She told him to take his medication.  He took his medication into the bedroom with him and shut the door.

## 2014-05-14 NOTE — Telephone Encounter (Signed)
Patient's wife notified and verbalized understanding.  

## 2014-05-14 NOTE — Telephone Encounter (Addendum)
Noted. If pt upset this could artificially elevate blood pressures. Monitor bp at home and notify me if persistently elevated when not upset

## 2014-05-19 ENCOUNTER — Ambulatory Visit (INDEPENDENT_AMBULATORY_CARE_PROVIDER_SITE_OTHER): Payer: Medicare PPO | Admitting: Family Medicine

## 2014-05-19 ENCOUNTER — Encounter: Payer: Self-pay | Admitting: Family Medicine

## 2014-05-19 VITALS — BP 134/92 | HR 80 | Temp 97.9°F | Wt 306.5 lb

## 2014-05-19 DIAGNOSIS — R310 Gross hematuria: Secondary | ICD-10-CM | POA: Insufficient documentation

## 2014-05-19 DIAGNOSIS — K7460 Unspecified cirrhosis of liver: Secondary | ICD-10-CM

## 2014-05-19 DIAGNOSIS — K7469 Other cirrhosis of liver: Secondary | ICD-10-CM

## 2014-05-19 DIAGNOSIS — I10 Essential (primary) hypertension: Secondary | ICD-10-CM

## 2014-05-19 DIAGNOSIS — K7581 Nonalcoholic steatohepatitis (NASH): Secondary | ICD-10-CM

## 2014-05-19 DIAGNOSIS — I951 Orthostatic hypotension: Secondary | ICD-10-CM

## 2014-05-19 DIAGNOSIS — R079 Chest pain, unspecified: Secondary | ICD-10-CM

## 2014-05-19 DIAGNOSIS — R319 Hematuria, unspecified: Secondary | ICD-10-CM

## 2014-05-19 LAB — POCT URINALYSIS DIPSTICK
BILIRUBIN UA: NEGATIVE
GLUCOSE UA: NEGATIVE
Ketones, UA: NEGATIVE
Leukocytes, UA: NEGATIVE
Nitrite, UA: NEGATIVE
SPEC GRAV UA: 1.015
UROBILINOGEN UA: 0.2
pH, UA: 6

## 2014-05-19 NOTE — Patient Instructions (Addendum)
EKG today. Urine checked today. Increase irbesartan from 1/4 tablet to 1/2 tablet daily for better blood pressure control. Then let me know if this is not helping control blood pressures. Return in 3-4 months for follow up, sooner if needed

## 2014-05-19 NOTE — Progress Notes (Signed)
BP 134/92 mmHg  Pulse 80  Temp(Src) 97.9 F (36.6 C) (Oral)  Wt 306 lb 8 oz (139.027 kg)   CC: check BP, ?gross hematuria  Subjective:    Patient ID: [name], male    DOB: 1955/12/05, 58 y.o.   MRN: [id number]  HPI: [name] is a 58 y.o. male presenting on 05/19/2014 for Blood Pressure Check and dark urine   Feeling bad for the last month. Fluctuating blood pressures up to 150s. + HA and intermittent chest pain described as soreness associated with elevated blood pressures not otherwise. No vision changes, SOB, leg swelling. Irbesartan decreased to 37.73m 03/2014 2/2 orthostatic hypotension. Pt states he's currently taking 1/4 tablet irbesartan 738malthough was advised to take half tablet secondary to orthostasis concerns.  Dark urine noted over last several weeks. "color of tea". Then yesterday noted orange-red urine. No dysuria. Endorses subjective fever but temp never over 99 when checked. Some nausea without vomiting. Notices sedimentation in urine. Chronic back pain, overall unchanged.  Lab Results  Component Value Date   CREATININE 0.85 01/05/2014    Relevant past medical, surgical, family and social history reviewed and updated as indicated. Interim medical history since our last visit reviewed. Allergies and medications reviewed and updated.  Current Outpatient Prescriptions on File Prior to Visit  Medication Sig  . albuterol (PROVENTIL) (2.5 MG/3ML) 0.083% nebulizer solution USE 1 VIAL PER NEBULIZER EVERY 6 HRS AS NEEDED FOR WHEEZING  . aspirin 325 MG tablet Take 325 mg by mouth daily.    . Blood Glucose Monitoring Suppl (ONE TOUCH ULTRA SYSTEM KIT) W/DEVICE KIT 1 kit by Does not apply route once.  . Cholecalciferol (VITAMIN D) 2000 UNITS CAPS Take 1 capsule (2,000 Units total) by mouth daily.  . clindamycin (CLEOCIN T) 1 % external solution Apply topically 2 (two) times daily.  . DULoxetine (CYMBALTA) 60 MG capsule TAKE ONE CAPSULE BY MOUTH EVERY DAY   . erythromycin with ethanol (EMGEL) 2 % gel Apply topically Daily.   . febuxostat (ULORIC) 40 MG tablet Take 1 tablet (40 mg total) by mouth daily. Allopurinol intolerant  . fentaNYL (DURAGESIC - DOSED MCG/HR) 25 MCG/HR patch Place 25 mcg onto the skin every 3 (three) days.  . fluticasone (FLONASE) 50 MCG/ACT nasal spray   . folic acid (FOLVITE) 1 MG tablet TAKE 1 TABLET BY MOUTH ONCE A DAY  . glucose blood (ONE TOUCH ULTRA TEST) test strip Use as instructed.  250.00  . irbesartan (AVAPRO) 75 MG tablet Take 0.5 tablets (37.5 mg total) by mouth daily.  . metFORMIN (GLUCOPHAGE) 500 MG tablet Take 0.5 tablets (250 mg total) by mouth at bedtime.  . metoprolol succinate (TOPROL-XL) 25 MG 24 hr tablet TAKE 1/2 TABLET EVERY DAY  . mirtazapine (REMERON) 30 MG tablet Take 30 mg by mouth at bedtime.  . nitroGLYCERIN (NITROSTAT) 0.4 MG SL tablet Place 1 tablet (0.4 mg total) under the tongue every 5 (five) minutes as needed for chest pain.  . polyethylene glycol powder (GLYCOLAX/MIRALAX) powder Take 17 g by mouth daily as needed for moderate constipation.  . vitamin B-12 (CYANOCOBALAMIN) 500 MCG tablet Take 500 mcg by mouth daily.   No current facility-administered medications on file prior to visit.   Past Medical History  Diagnosis Date  . CAD (coronary artery disease)     x3 with stents last 2005, EF 40%  . Ischemic cardiomyopathy     s/p inferior MI  . 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 COPD (chronic obstructive pulmonary disease) 10/2011    minimal by PFTs  . T2DM (type 2 diabetes mellitus)   . Disturbances of sensation of smell and taste   . Gout   . Hidradenitis suppurativa 2011    followd by Lyndle Herrlich - daily bactrim, s/p intralesional steroid injection 10/2010  . HLD (hyperlipidemia)   . HTN (hypertension)   . History of MI (myocardial infarction)   . Obesity   . OSA (obstructive sleep apnea)   . Tobacco use disorder     cigar  . History of non anemic  vitamin B12 deficiency     s/p B12 shots, now on oral supplementation (10/2011)  . Vitamin D deficiency   . Weight loss   . Allergic rhinitis   . Cervical spondylosis 05/2010    s/p surgery  . DDD (degenerative disc disease)   . Lumbar herniated disc   . Spinal stenosis     released from [place].  established with preferred pain (07/2013)  . AAA (abdominal aortic aneurysm) 09/2012    stable 4.5cm CTA abdomen 01/2014  . Hip osteoarthritis     s/p intraarticular steroid shot (12/2012) (Ibazebo/Caffrey)  . History of hepatitis B 1983  . History of viral meningitis 2000  . Chronic pain syndrome     [facility]  . Liver cirrhosis secondary to NASH 01/2014    by CT scan    Past Surgical History  Procedure Laterality Date  . Tonsillectomy and adenoidectomy  1972  . Lumbar disc surgery      L5-S1  . Cervical discectomy  01/2010    Anterior C4-7, cervical cord decompression and B foraminotomy ([place])(metal plate)  . Lumbar laminectomy  12/11    L2-5laminectomy/foraminotomy for stenosis ([place])  . Horizon study stenting x4  2000, 2005    in the RCA with kissing balloon angioplasty, of the PDA ostium-5 stents total per patient  . Cardiac catheterization  03/30/05    Left, Left ventriculography, coronary angiography, intravascular [place] of RCA  . Myelogram      L5-S1 and L1-2 spondylosis  . Scrotal US  07/2010    hydradenitis - testes WNL, small R epididymal cyst, small L varicocele, no sinus tract mention  . Colonoscopy  05/06/2007    normal, small int hemorrhoids rpt 5 yrs due to fmhx  . Sacroiliac joint injection Bilateral 10/2013    [place]   History  Substance Use Topics  . Smoking status: Current Every Day Smoker -- 0.50 packs/day for 43 years    Types: Cigarettes, Pipe  . Smokeless tobacco: Never Used  . Alcohol Use: No   Review of Systems Per HPI unless specifically indicated above     Objective:    BP 134/92 mmHg  Pulse 80  Temp(Src) 97.9  F (36.6 C) (Oral)  Wt 306 lb 8 oz (139.027 kg)  Wt Readings from Last 3 Encounters:  05/19/14 306 lb 8 oz (139.027 kg)  04/21/14 307 lb 8 oz (139.481 kg)  03/13/14 301 lb 8 oz (136.76 kg)    Physical Exam  Constitutional: He appears well-developed and well-nourished. No distress.  Morbidly obese  HENT:  Mouth/Throat: Oropharynx is clear and moist. No oropharyngeal exudate.  Cardiovascular: Normal rate, regular rhythm, normal heart sounds and intact distal pulses.   No murmur heard. Pulmonary/Chest: Effort normal and breath sounds normal. No respiratory distress. He has no wheezes. He has no rales.  Abdominal: Soft. Bowel sounds  are normal. He exhibits no distension and no mass. Tenderness: diffuse R sided. There is no rebound and no guarding.  Musculoskeletal: He exhibits no edema.  Skin: Skin is warm and dry.  Nursing note and vitals reviewed.      Assessment & Plan:   Problem List Items Addressed This Visit    Orthostatic hypotension    No longer seems to be an issue. Monitor on 1/2 tab irbesartan 56m daily    Liver cirrhosis secondary to NASH    Has been referred to Dr MCollene Mares- had to call and cancel appointment, will reschedule for January.    Hematuria    New dx - confirmed by microscopy. In long time smoker will recommend referral to urology. Referral placed. Lab Results  Component Value Date   CREATININE 0.85 01/05/2014      Relevant Orders      Ambulatory referral to Urology   Essential hypertension - Primary (Chronic)    Pt states he's been taking 1/4 tablet of irbesartan 777mand 1/2 tablet toprol xl 2550mnd notes some elevated readings recently - so I've asked him to increase irbesartan back to 1/2 tablet daily and notify me with effect.    Chest pain    Reviewed latest cards note from 12/2013.  Chest pain associated with elevated blood pressures, so will work towards better blood pressure control Recheck EKG today  - NSR rate 75, normal axis, intervals, no  acute ST/T changes, T inversion anterior leads otherwise unchanged from prior. If recurrent pains will recommend return to cards.    Relevant Orders      EKG 12-Lead (Completed)       Follow up plan: Return if symptoms worsen or fail to improve, for follow up visit.

## 2014-05-19 NOTE — Assessment & Plan Note (Signed)
Has been referred to Dr Collene Mares - had to call and cancel appointment, will reschedule for January.

## 2014-05-19 NOTE — Assessment & Plan Note (Signed)
Pt states he's been taking 1/4 tablet of irbesartan 80m and 1/2 tablet toprol xl 259mand notes some elevated readings recently - so I've asked him to increase irbesartan back to 1/2 tablet daily and notify me with effect.

## 2014-05-19 NOTE — Assessment & Plan Note (Addendum)
Reviewed latest cards note from 12/2013.  Chest pain associated with elevated blood pressures, so will work towards better blood pressure control Recheck EKG today  - NSR rate 75, normal axis, intervals, no acute ST/T changes, T inversion anterior leads otherwise unchanged from prior. If recurrent pains will recommend return to cards.

## 2014-05-19 NOTE — Assessment & Plan Note (Signed)
No longer seems to be an issue. Monitor on 1/2 tab irbesartan 28m daily

## 2014-05-19 NOTE — Assessment & Plan Note (Addendum)
New dx - confirmed by microscopy. In long time smoker will recommend referral to urology. Referral placed. Lab Results  Component Value Date   CREATININE 0.85 01/05/2014

## 2014-05-20 ENCOUNTER — Telehealth: Payer: Self-pay

## 2014-05-20 NOTE — Telephone Encounter (Signed)
Pt left v/m requesting cb when urine results received.

## 2014-05-20 NOTE — Telephone Encounter (Signed)
See result note commented on this morning.

## 2014-05-21 LAB — URINE CULTURE
COLONY COUNT: NO GROWTH
Organism ID, Bacteria: NO GROWTH

## 2014-06-16 ENCOUNTER — Telehealth: Payer: Self-pay | Admitting: Family Medicine

## 2014-06-16 NOTE — Telephone Encounter (Signed)
Did you put this in Dr. Synthia Innocent IN box? I don't have this form?

## 2014-06-16 NOTE — Telephone Encounter (Signed)
Pts wife dropped of disability placard request to be filled out. Please call when ready. Will put inbox

## 2014-06-17 NOTE — Telephone Encounter (Signed)
Patient's wife notified and paperwork placed up front for pick up. She wanted to let you know that he went to see Dr. Collene Mares yesterday and was told that he is not a candidate for a colonoscopy or surgery for AAA repair because he may not come out of the anesthesia. Dr. Collene Mares told him "just to live his life the best he could". Records requested.

## 2014-06-17 NOTE — Telephone Encounter (Signed)
In your IN box for completion.  

## 2014-06-17 NOTE — Telephone Encounter (Signed)
Filled and in [name]'s box. 

## 2014-06-18 ENCOUNTER — Encounter: Payer: Self-pay | Admitting: Family Medicine

## 2014-06-18 NOTE — Telephone Encounter (Signed)
Noted  

## 2014-06-25 ENCOUNTER — Ambulatory Visit: Payer: Medicare PPO | Admitting: Family Medicine

## 2014-06-26 ENCOUNTER — Ambulatory Visit: Payer: Medicare PPO | Admitting: Family Medicine

## 2014-07-03 ENCOUNTER — Encounter: Payer: Self-pay | Admitting: Family Medicine

## 2014-07-06 ENCOUNTER — Telehealth: Payer: Self-pay | Admitting: *Deleted

## 2014-07-06 NOTE — Telephone Encounter (Signed)
Patient's wife left a voicemail requesting a script for a non-smoking patch. Pharmacy [facility]

## 2014-07-07 MED ORDER — NICOTINE 7 MG/24HR TD PT24: 7.0000 mg | MEDICATED_PATCH | Freq: Every day | TRANSDERMAL | Status: DC

## 2014-07-07 NOTE — Telephone Encounter (Signed)
Sent to pharmacy 

## 2014-07-13 ENCOUNTER — Ambulatory Visit: Payer: Medicare PPO | Admitting: Family Medicine

## 2014-07-22 ENCOUNTER — Ambulatory Visit (INDEPENDENT_AMBULATORY_CARE_PROVIDER_SITE_OTHER): Payer: Medicare PPO | Admitting: Family Medicine

## 2014-07-22 ENCOUNTER — Encounter: Payer: Self-pay | Admitting: Family Medicine

## 2014-07-22 VITALS — BP 114/74 | HR 92 | Temp 98.2°F | Wt 295.2 lb

## 2014-07-22 DIAGNOSIS — E559 Vitamin D deficiency, unspecified: Secondary | ICD-10-CM

## 2014-07-22 DIAGNOSIS — K7460 Unspecified cirrhosis of liver: Secondary | ICD-10-CM

## 2014-07-22 DIAGNOSIS — G4733 Obstructive sleep apnea (adult) (pediatric): Secondary | ICD-10-CM

## 2014-07-22 DIAGNOSIS — Z720 Tobacco use: Secondary | ICD-10-CM

## 2014-07-22 DIAGNOSIS — K7581 Nonalcoholic steatohepatitis (NASH): Secondary | ICD-10-CM

## 2014-07-22 DIAGNOSIS — Z79899 Other long term (current) drug therapy: Secondary | ICD-10-CM

## 2014-07-22 DIAGNOSIS — R1314 Dysphagia, pharyngoesophageal phase: Secondary | ICD-10-CM

## 2014-07-22 DIAGNOSIS — K7469 Other cirrhosis of liver: Secondary | ICD-10-CM

## 2014-07-22 DIAGNOSIS — R1319 Other dysphagia: Secondary | ICD-10-CM

## 2014-07-22 DIAGNOSIS — F17200 Nicotine dependence, unspecified, uncomplicated: Secondary | ICD-10-CM

## 2014-07-22 DIAGNOSIS — R1310 Dysphagia, unspecified: Secondary | ICD-10-CM

## 2014-07-22 DIAGNOSIS — R5382 Chronic fatigue, unspecified: Secondary | ICD-10-CM

## 2014-07-22 DIAGNOSIS — R531 Weakness: Secondary | ICD-10-CM | POA: Insufficient documentation

## 2014-07-22 LAB — VITAMIN B12: Vitamin B-12: 791 pg/mL (ref 211–911)

## 2014-07-22 LAB — COMPREHENSIVE METABOLIC PANEL
ALT: 18 U/L (ref 0–53)
AST: 23 U/L (ref 0–37)
Albumin: 3.4 g/dL — ABNORMAL LOW (ref 3.5–5.2)
Alkaline Phosphatase: 135 U/L — ABNORMAL HIGH (ref 39–117)
BILIRUBIN TOTAL: 0.6 mg/dL (ref 0.2–1.2)
BUN: 17 mg/dL (ref 6–23)
CALCIUM: 9.3 mg/dL (ref 8.4–10.5)
CHLORIDE: 101 meq/L (ref 96–112)
CO2: 28 mEq/L (ref 19–32)
CREATININE: 0.96 mg/dL (ref 0.40–1.50)
GFR: 85.1 mL/min (ref >60.00)
Glucose, Bld: 98 mg/dL (ref 70–99)
Potassium: 4.2 mEq/L (ref 3.5–5.1)
Sodium: 135 mEq/L (ref 135–145)
TOTAL PROTEIN: 7.8 g/dL (ref 6.0–8.3)

## 2014-07-22 LAB — CBC WITH DIFFERENTIAL/PLATELET
BASOS PCT: 0.4 % (ref 0.0–3.0)
Basophils Absolute: 0 10*3/uL (ref 0.0–0.1)
EOS PCT: 2.1 % (ref 0.0–5.0)
Eosinophils Absolute: 0.2 10*3/uL (ref 0.0–0.7)
HEMATOCRIT: 44.4 % (ref 39.0–52.0)
Hemoglobin: 15.2 g/dL (ref 13.0–17.0)
LYMPHS ABS: 2.3 10*3/uL (ref 0.7–4.0)
Lymphocytes Relative: 24.4 % (ref 12.0–46.0)
MCHC: 34.2 g/dL (ref 30.0–36.0)
MCV: 98.8 fl (ref 78.0–100.0)
MONOS PCT: 4.8 % (ref 3.0–12.0)
Monocytes Absolute: 0.5 10*3/uL (ref 0.1–1.0)
NEUTROS ABS: 6.4 10*3/uL (ref 1.4–7.7)
Neutrophils Relative %: 68.3 % (ref 43.0–77.0)
Platelets: 133 10*3/uL — ABNORMAL LOW (ref 150.0–400.0)
RBC: 4.49 Mil/uL (ref 4.22–5.81)
RDW: 15.4 % (ref 11.5–15.5)
WBC: 9.3 10*3/uL (ref 4.0–10.5)

## 2014-07-22 LAB — VITAMIN D 25 HYDROXY (VIT D DEFICIENCY, FRACTURES): VITD: 25.75 ng/mL — AB (ref 30.00–100.00)

## 2014-07-22 LAB — TSH: TSH: 1.44 u[IU]/mL (ref 0.35–4.50)

## 2014-07-22 MED ORDER — OMEPRAZOLE 40 MG PO CPDR: 40.0000 mg | DELAYED_RELEASE_CAPSULE | Freq: Every day | ORAL | Status: DC

## 2014-07-22 NOTE — Progress Notes (Signed)
Pre visit review using our clinic review tool, if applicable. No additional management support is needed unless otherwise documented below in the visit note. 

## 2014-07-22 NOTE — Assessment & Plan Note (Signed)
Weight loss noted, pt not trying. Discussed healthy ways to lose weight - encouraged he join gym to participate in aquatic exercises.

## 2014-07-22 NOTE — Assessment & Plan Note (Addendum)
Continue to encourage cessation. Contemplative. States he's ordered nicotine patches

## 2014-07-22 NOTE — Assessment & Plan Note (Addendum)
Continue to encourage CPAP use.

## 2014-07-22 NOTE — Assessment & Plan Note (Signed)
Check TSH, CBC, B12 and Vit D today.

## 2014-07-22 NOTE — Patient Instructions (Addendum)
Dr Collene Mares recommended virtual colonoscopy. See below. Call Dr Lorie Apley office to schedule.  Labwork to check on chronic fatigue.  Pass by Marion's office to schedule barium swallow. Restart omeprazole 52m daily.  Virtual Colonoscopy Virtual colonoscopy (VC) uses X-rays and computers (CT scan or MRI) to produce two- and three-dimensional images of the large intestine (colon), the rectum, and the small intestine. The images will display on a screen. These images are clearer and more detailed than a conventional X-ray using a barium enema. The procedure is used to diagnose colon and bowel disease, including polyps, diverticulosis, and cancer.  During a VC, your doctor cannot take tissue samples or remove polyps. A conventional colonoscopy must be performed if abnormalities are found. Also, VC does not show as much detail as a conventional colonoscopy. Abnormal growths (polyps) smaller than 10 mm in diameter may not show up on the images. BEFORE THE PROCEDURE Preparations for VC vary. But you will usually be asked to take laxatives or other oral agents at home the day before the procedure. This is to clear stool from your colon. You may also be asked to use a suppository. This is to cleanse your rectum of any remaining fecal matter. PROCEDURE The exam takes about 10 minutes and does not require sedatives.   You will lie on your back on a table.  A thin tube will be inserted into your rectum. Air will be pumped through the tube to inflate the colon for better viewing.  The table moves through the scanner to produce a series of two-dimensional cross-sections along the length of the colon. A computer program puts these images together to create a three-dimensional picture that can be viewed on the video screen.  You will be asked to hold your breath during the scan. This is to avoid distortion on the images.  The scanning procedure is repeated with you lying on your stomach. AFTER THE PROCEDURE  The  information from the scanner must be processed to create the computer picture or image of your colon. The results will be evaluated to identify any abnormalities. Your caregiver may ask you to wait while the test results are analyzed.  If abnormalities are found and you need a conventional colonoscopy, it may be performed the same day.  You may resume normal activity after the procedure. FOR MORE INFORMATION  American College of Gastroenterology ([facility]: wAnonymousEar.frICedrofor Functional Gastrointestinal Disorders (IFFGD): www.iffgd.o[place]ryDocument Released: 03/22/2004 Document Revised: 10/06/2013 Document Reviewed: 05/24/2008 EThe [facility] Center At Northbay Vaca ValleyPatient Information 2015 [place] This information is not intended to replace advice given to you by your health care provider. Make sure you discuss any questions you have with your health care provider.

## 2014-07-22 NOTE — Progress Notes (Signed)
BP 114/74 mmHg  Pulse 92  Temp(Src) 98.2 F (36.8 C) (Oral)  Wt 295 lb 4 oz (133.925 kg)   CC: discuss colonoscopy  Subjective:    Patient ID: [name], male    DOB: 1956/01/08, 59 y.o.   MRN: [id number]  HPI: [name] is a 59 y.o. male presenting on 07/22/2014 for Follow-up   Stays fatigued, chronic over last 6 months to 1 year. Appetite down, weight loss noted. Not trying.   Endorses some esophageal dysphagia ongoing for last year. Endorses some early satiety. Sometimes vomits due to dysphagia.   Smoking - 1 ppd. Planning on trying nicotine patches.   [facility]   Trouble sleeping. Bedtime 12 midnight, actually falls asleep 5am. Sleeps about 4 hours nightly. Takes daytime naps. Denies snoring or apnea. H/o OSA but has refused CPAP. Has machine at home but doesn't use.   COLONOSCOPY Date: 05/06/2007 normal, small int hemorrhoids rpt 5 yrs due to fmhx - rec against rpt colonoscopy by Dr Collene Mares, recommended virtual colonoscopy. Has not contacted Dr Lorie Apley office to schedule this.  Relevant past medical, surgical, family and social history reviewed and updated as indicated. Interim medical history since our last visit reviewed. Allergies and medications reviewed and updated. Current Outpatient Prescriptions on File Prior to Visit  Medication Sig  . albuterol (PROVENTIL) (2.5 MG/3ML) 0.083% nebulizer solution USE 1 VIAL PER NEBULIZER EVERY 6 HRS AS NEEDED FOR WHEEZING  . aspirin 325 MG tablet Take 325 mg by mouth daily.    . Blood Glucose Monitoring Suppl (ONE TOUCH ULTRA SYSTEM KIT) W/DEVICE KIT 1 kit by Does not apply route once.  . Cholecalciferol (VITAMIN D) 2000 UNITS CAPS Take 1 capsule (2,000 Units total) by mouth daily.  . clindamycin (CLEOCIN T) 1 % external solution Apply topically 2 (two) times daily.  . DULoxetine (CYMBALTA) 60 MG capsule TAKE ONE CAPSULE BY MOUTH EVERY DAY  . erythromycin with ethanol (EMGEL) 2 % gel Apply  topically Daily.   . febuxostat (ULORIC) 40 MG tablet Take 1 tablet (40 mg total) by mouth daily. Allopurinol intolerant  . fentaNYL (DURAGESIC - DOSED MCG/HR) 25 MCG/HR patch Place 25 mcg onto the skin every 3 (three) days.  . fluticasone (FLONASE) 50 MCG/ACT nasal spray   . folic acid (FOLVITE) 1 MG tablet TAKE 1 TABLET BY MOUTH ONCE A DAY  . glucose blood (ONE TOUCH ULTRA TEST) test strip Use as instructed.  250.00  . irbesartan (AVAPRO) 75 MG tablet Take 0.5 tablets (37.5 mg total) by mouth daily.  . metFORMIN (GLUCOPHAGE) 500 MG tablet Take 0.5 tablets (250 mg total) by mouth at bedtime.  . metoprolol succinate (TOPROL-XL) 25 MG 24 hr tablet TAKE 1/2 TABLET EVERY DAY  . mirtazapine (REMERON) 30 MG tablet Take 30 mg by mouth at bedtime.  . nicotine (NICODERM CQ) 7 mg/24hr patch Place 1 patch (7 mg total) onto the skin daily.  . nitroGLYCERIN (NITROSTAT) 0.4 MG SL tablet Place 1 tablet (0.4 mg total) under the tongue every 5 (five) minutes as needed for chest pain.  [name] oxycodone (ROXICODONE) 30 MG immediate release tablet Take 30 mg by mouth every 4 (four) hours as needed for pain.  . polyethylene glycol powder (GLYCOLAX/MIRALAX) powder Take 17 g by mouth daily as needed for moderate constipation.  . vitamin B-12 (CYANOCOBALAMIN) 500 MCG tablet Take 500 mcg by mouth daily.   No current facility-administered medications on file prior to visit.  Past Medical History  Diagnosis Date  . CAD (coronary artery disease)     x3 with stents last 2005, EF 40%  . Ischemic cardiomyopathy     s/p inferior MI  . 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 COPD (chronic obstructive pulmonary disease) 10/2011    minimal by PFTs  . T2DM (type 2 diabetes mellitus)   . Disturbances of sensation of smell and taste   . Gout   . Hidradenitis suppurativa 2011    followd by Lyndle Herrlich - daily bactrim, s/p intralesional steroid injection 10/2010  . HLD (hyperlipidemia)   . HTN (hypertension)    . History of MI (myocardial infarction)   . Obesity   . OSA (obstructive sleep apnea)   . Tobacco use disorder     cigar  . History of non anemic vitamin B12 deficiency     s/p B12 shots, now on oral supplementation (10/2011)  . Vitamin D deficiency   . Weight loss   . Allergic rhinitis   . Cervical spondylosis 05/2010    s/p surgery  . DDD (degenerative disc disease)   . Lumbar herniated disc   . Spinal stenosis     released from [place].  established with preferred pain (07/2013)  . AAA (abdominal aortic aneurysm) 09/2012    stable 4.5cm CTA abdomen 01/2014  . Hip osteoarthritis     s/p intraarticular steroid shot (12/2012) (Ibazebo/Caffrey)  . History of hepatitis B 1983  . History of viral meningitis 2000  . Chronic pain syndrome     established with [facility]  . Liver cirrhosis secondary to NASH 01/2014    by CT scan, rec virtual colonoscopy by Dr Collene Mares 06/2014    Review of Systems Per HPI unless specifically indicated above     Objective:    BP 114/74 mmHg  Pulse 92  Temp(Src) 98.2 F (36.8 C) (Oral)  Wt 295 lb 4 oz (133.925 kg)  Wt Readings from Last 3 Encounters:  07/22/14 295 lb 4 oz (133.925 kg)  05/19/14 306 lb 8 oz (139.027 kg)  04/21/14 307 lb 8 oz (139.481 kg)   Body mass index is 45.91 kg/(m^2).  Physical Exam  Constitutional: He appears well-developed and well-nourished. No distress.  Morbidly obese  HENT:  Mouth/Throat: Oropharynx is clear and moist. No oropharyngeal exudate.  Eyes: Conjunctivae and EOM are normal. Pupils are equal, round, and reactive to light. No scleral icterus.  Neck: Normal range of motion. Neck supple. No thyromegaly present.  Cardiovascular: Normal rate, regular rhythm, normal heart sounds and intact distal pulses.   No murmur heard. Pulmonary/Chest: Effort normal and breath sounds normal. No respiratory distress. He has no wheezes. He has no rales.  Nursing note and vitals reviewed.      Assessment &  Plan:  Colon cancer screening - reviewed Dr Lorie Apley note with patient - encouraged he call her office to schedule virtual colonoscopy as per her recommendations. Problem List Items Addressed This Visit    TOBACCO ABUSE    Continue to encourage cessation. Contemplative. States he's ordered nicotine patches      OSA (obstructive sleep apnea)    Continue to encourage CPAP use.      Morbid obesity    Weight loss noted, pt not trying. Discussed healthy ways to lose weight - encouraged he join gym to participate in aquatic exercises.      Liver cirrhosis secondary to NASH    Consider abd  [place] 01/2015.  Check CMP today.      Relevant Orders   Comprehensive metabolic panel (Completed)   Esophageal dysphagia - Primary    Endorses longstanding esophageal dysphagia. Will obtain barium swallow to eval for stricture or other anatomic obstruction, start PPI, and discussed if persistent trouble to talk with Dr Collene Mares about possible EGD.  Pt agrees with plan.      Relevant Orders   DG Esophagus   Chronic fatigue    Check TSH, CBC, B12 and Vit D today.      Relevant Orders   TSH (Completed)   CBC with Differential/Platelet (Completed)   Vitamin B12 (Completed)   Vit D  25 hydroxy (rtn osteoporosis monitoring) (Completed)       Follow up plan: Return as needed, for follow up visit.

## 2014-07-22 NOTE — Assessment & Plan Note (Signed)
Endorses longstanding esophageal dysphagia. Will obtain barium swallow to eval for stricture or other anatomic obstruction, start PPI, and discussed if persistent trouble to talk with Dr Collene Mares about possible EGD.  Pt agrees with plan.

## 2014-07-22 NOTE — Assessment & Plan Note (Signed)
Consider abd [place] 01/2015.  Check CMP today.

## 2014-07-23 ENCOUNTER — Encounter: Payer: Self-pay | Admitting: *Deleted

## 2014-07-23 ENCOUNTER — Other Ambulatory Visit: Payer: Self-pay | Admitting: Gastroenterology

## 2014-07-23 DIAGNOSIS — Z800 Family history of malignant neoplasm of digestive organs: Secondary | ICD-10-CM

## 2014-07-23 DIAGNOSIS — K5730 Diverticulosis of large intestine without perforation or abscess without bleeding: Secondary | ICD-10-CM

## 2014-07-28 ENCOUNTER — Ambulatory Visit (HOSPITAL_COMMUNITY)
Admission: RE | Admit: 2014-07-28 | Discharge: 2014-07-28 | Disposition: A | Discharge status: Home / Self Care | Payer: Medicare PPO | Source: Ambulatory Visit | Attending: Family Medicine | Admitting: Family Medicine

## 2014-07-28 DIAGNOSIS — K224 Dyskinesia of esophagus: Secondary | ICD-10-CM | POA: Diagnosis not present

## 2014-07-28 DIAGNOSIS — E119 Type 2 diabetes mellitus without complications: Secondary | ICD-10-CM | POA: Insufficient documentation

## 2014-07-28 DIAGNOSIS — I2510 Atherosclerotic heart disease of native coronary artery without angina pectoris: Secondary | ICD-10-CM | POA: Diagnosis not present

## 2014-07-28 DIAGNOSIS — R1319 Other dysphagia: Secondary | ICD-10-CM | POA: Diagnosis present

## 2014-07-28 DIAGNOSIS — J449 Chronic obstructive pulmonary disease, unspecified: Secondary | ICD-10-CM | POA: Diagnosis not present

## 2014-07-28 DIAGNOSIS — R1310 Dysphagia, unspecified: Secondary | ICD-10-CM

## 2014-07-30 ENCOUNTER — Telehealth: Payer: Self-pay

## 2014-07-30 NOTE — Telephone Encounter (Signed)
Patient informed of normal swallow evaluation and directions.

## 2014-07-30 NOTE — Telephone Encounter (Signed)
-----   Message from Ria Bush, MD sent at 07/30/2014  9:53 AM EST ----- plz notify swallow evaluation overall was normal. Can continue to monitor or would have him return to Dr Collene Mares for further evaluation if persistent trouble and if omeprazole doesn't help.

## 2014-07-31 ENCOUNTER — Ambulatory Visit
Admission: RE | Admit: 2014-07-31 | Discharge: 2014-07-31 | Disposition: A | Discharge status: Home / Self Care | Payer: Medicare PPO | Source: Ambulatory Visit | Attending: Gastroenterology | Admitting: Gastroenterology

## 2014-07-31 DIAGNOSIS — K5730 Diverticulosis of large intestine without perforation or abscess without bleeding: Secondary | ICD-10-CM

## 2014-07-31 DIAGNOSIS — Z800 Family history of malignant neoplasm of digestive organs: Secondary | ICD-10-CM

## 2014-08-05 ENCOUNTER — Telehealth: Payer: Self-pay | Admitting: *Deleted

## 2014-08-05 DIAGNOSIS — I714 Abdominal aortic aneurysm, without rupture, unspecified: Secondary | ICD-10-CM

## 2014-08-05 NOTE — Telephone Encounter (Signed)
Patient had a virtiual colonoscopy and the AAA has grown to 5.0 She wanted to make sure dr Stanford Breed was aware and this was taken care of. Patient is due to see dr Stanford Breed in July. Will forward for dr Stanford Breed review

## 2014-08-06 ENCOUNTER — Other Ambulatory Visit: Payer: Self-pay | Admitting: Family Medicine

## 2014-08-06 NOTE — Telephone Encounter (Signed)
Would ask pt to see vascular surgery Kirk Ruths

## 2014-08-06 NOTE — Telephone Encounter (Signed)
Referral placed. Left message for pt to call

## 2014-08-07 NOTE — Telephone Encounter (Signed)
Spoke with pt, CTA results discussed. Aware he needs to see [facility].

## 2014-08-10 ENCOUNTER — Encounter: Payer: Self-pay | Admitting: Family Medicine

## 2014-08-11 ENCOUNTER — Other Ambulatory Visit: Payer: Self-pay

## 2014-08-12 ENCOUNTER — Telehealth: Payer: Self-pay | Admitting: Vascular Surgery

## 2014-08-12 NOTE — Telephone Encounter (Signed)
I spoke with [name]. We did not have any openings at this time that are earlier than 03/16. I have placed him on our waiting list. He agrees. dpm

## 2014-08-12 NOTE — Telephone Encounter (Signed)
-----   Message from Denman George, RN sent at 08/12/2014 12:48 PM EST ----- Regarding: new pt. requesting earlier appt. Phone call from pt's. wife.  Requesting to move pt's appt. to an earlier date.  c/o swelling of right side of abdomen and not able to eat.  Advised to call Dr. Stanford Breed, or take pt. to ER, if symptoms are worsening.  Can you check on any options for a new pt. appt. earlier than 3/16?

## 2014-08-16 ENCOUNTER — Encounter: Payer: Self-pay | Admitting: Family Medicine

## 2014-08-18 ENCOUNTER — Encounter: Payer: Self-pay | Admitting: Vascular Surgery

## 2014-08-19 ENCOUNTER — Encounter: Payer: Medicare PPO | Admitting: Vascular Surgery

## 2014-08-19 ENCOUNTER — Telehealth: Payer: Self-pay | Admitting: Vascular Surgery

## 2014-08-19 ENCOUNTER — Encounter: Payer: Self-pay | Admitting: Vascular Surgery

## 2014-08-19 ENCOUNTER — Ambulatory Visit (INDEPENDENT_AMBULATORY_CARE_PROVIDER_SITE_OTHER): Payer: Medicare PPO | Admitting: Vascular Surgery

## 2014-08-19 VITALS — BP 124/89 | HR 87 | Resp 18 | Ht 68.0 in | Wt 290.7 lb

## 2014-08-19 DIAGNOSIS — I714 Abdominal aortic aneurysm, without rupture, unspecified: Secondary | ICD-10-CM

## 2014-08-19 DIAGNOSIS — Z01810 Encounter for preprocedural cardiovascular examination: Secondary | ICD-10-CM

## 2014-08-19 NOTE — Progress Notes (Signed)
[facility] [place] HISTORY AND PHYSICAL   History of Present Illness:  Patient is a 59 y.o. year old male who presents for evaluation of abdominal aortic aneurysm. The patient's aneurysm was discovered several years ago. It was 3.5 cm in diameter he thinks about 3-4 years ago. He has chronic right-sided abdominal and back pain. This has been present for years and not changing.  He has significant family history of aneurysm his mother had an abdominal aortic aneurysm. His brother had an abdominal aortic aneurysm. He is a former smoker quit 1 month ago. He does have mild COPD. He also has a history of coronary artery disease with multiple prior coronary stents. He is followed by Dr. Stanford Breed. He has not had any prior abdominal operations. He does have a chronic right groin hidradenitis suppurativa..  Other medical problems include sleep apnea, Charcot-Marie-Tooth, multiple herniated lumbar disks, spinal stenosis, vitamin B12 deficiency, obesity, Nonalcoholic steatohepatitis. He has had some weight loss. His maximum weight was 359 pounds. He is currently down to 290 pounds.  Past Medical History  Diagnosis Date  . CAD (coronary artery disease)     x3 with stents last 2005, EF 40%, predominantly RCA by CT 2016  . Ischemic cardiomyopathy     s/p inferior MI  . 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 COPD (chronic obstructive pulmonary disease) 10/2011    minimal by PFTs  . T2DM (type 2 diabetes mellitus)   . Disturbances of sensation of smell and taste   . Gout   . Hidradenitis suppurativa 2011    followd by Lyndle Herrlich - daily bactrim, s/p intralesional steroid injection 10/2010  . HLD (hyperlipidemia)   . HTN (hypertension)   . History of MI (myocardial infarction)   . Obesity   . OSA (obstructive sleep apnea)   . Tobacco use disorder     cigar  . History of non anemic vitamin B12 deficiency     s/p B12 shots, now on oral supplementation (10/2011)  .  Vitamin D deficiency   . Weight loss   . Allergic rhinitis   . Cervical spondylosis 05/2010    s/p surgery  . DDD (degenerative disc disease)   . Lumbar herniated disc   . Spinal stenosis     released from [facility].  established with preferred pain (07/2013)  . AAA (abdominal aortic aneurysm) 09/2012    stable 4.5cm CTA abdomen 01/2014  . Hip osteoarthritis     s/p intraarticular steroid shot (12/2012) (Ibazebo/Caffrey)  . History of hepatitis B 1983  . History of viral meningitis 2000  . Chronic pain syndrome     [facility]  . Liver cirrhosis secondary to NASH 01/2014    by CT scan, rec virtual colonoscopy by Dr Collene Mares 06/2014    Past Surgical History  Procedure Laterality Date  . Tonsillectomy and adenoidectomy  1972  . Lumbar disc surgery      L5-S1  . Cervical discectomy  01/2010    Anterior C4-7, cervical cord decompression and B foraminotomy (Botero)(metal plate)  . Lumbar laminectomy  12/11    L2-5laminectomy/foraminotomy for stenosis (Botero)  . Horizon study stenting x4  2000, 2005    in the RCA with kissing balloon angioplasty, of the PDA ostium-5 stents total per patient  . Cardiac catheterization  03/30/05    Left, Left ventriculography, coronary angiography, intravascular [place] of RCA  . Myelogram  L5-S1 and L1-2 spondylosis  . Scrotal US  07/2010    hydradenitis - testes WNL, small R epididymal cyst, small L varicocele, no sinus tract mention  . Colonoscopy  05/06/2007    normal, small int hemorrhoids rpt 5 yrs due to fmhx - rec against rpt colonoscopy by Dr Collene Mares  . Sacroiliac joint injection Bilateral 10/2013    [facility]  . Virtual colonoscopy  07/2014    suboptimal, diverticula, no polyps identified Collene Mares)    Social History History  Substance Use Topics  . Smoking status: Former Smoker -- 0.50 packs/day for 43 years    Types: Cigarettes, Pipe    Quit date: 08/04/2014  . Smokeless tobacco: Never Used  . Alcohol Use: No     Family History Family History  Problem Relation Age of Onset  . Cancer Mother     colon  . Diabetes Mother   . Kidney disease Mother   . Aneurysm Mother     AAA  . Rheum arthritis Mother   . Charcot-Marie-Tooth disease Mother   . Cancer Father     skin  . Heart attack Father   . Cancer Brother     skin  . Coronary artery disease Brother   . Cancer Brother     small cell lung cancer  . Aneurysm Brother     AAA  . Rheum arthritis Sister   . Rheum arthritis Brother     Allergies  Allergies  Allergen Reactions  . Statins Shortness Of Breath    Cough, trouble breathing  . Allopurinol Nausea Only  . Losartan      Current Outpatient Prescriptions  Medication Sig Dispense Refill  . albuterol (PROVENTIL) (2.5 MG/3ML) 0.083% nebulizer solution USE 1 VIAL PER NEBULIZER EVERY 6 HRS AS NEEDED FOR WHEEZING 75 mL 6  . aspirin 325 MG tablet Take 325 mg by mouth daily.      . Blood Glucose Monitoring Suppl (ONE TOUCH ULTRA SYSTEM KIT) W/DEVICE KIT 1 kit by Does not apply route once. 1 each 0  . Cholecalciferol (VITAMIN D) 2000 UNITS CAPS Take 1 capsule (2,000 Units total) by mouth daily. 30 capsule 11  . DULoxetine (CYMBALTA) 60 MG capsule TAKE ONE CAPSULE BY MOUTH EVERY DAY 30 capsule 6  . erythromycin with ethanol (EMGEL) 2 % gel Apply topically Daily.     . fentaNYL (DURAGESIC - DOSED MCG/HR) 25 MCG/HR patch Place 25 mcg onto the skin every 3 (three) days.    . fluticasone (FLONASE) 50 MCG/ACT nasal spray     . folic acid (FOLVITE) 1 MG tablet TAKE 1 TABLET BY MOUTH ONCE A DAY 90 tablet 3  . glucose blood (ONE TOUCH ULTRA TEST) test strip Use as instructed.  250.00 100 each 12  . irbesartan (AVAPRO) 75 MG tablet Take 0.5 tablets (37.5 mg total) by mouth daily. 45 tablet 3  . metFORMIN (GLUCOPHAGE) 500 MG tablet Take 0.5 tablets (250 mg total) by mouth at bedtime. 90 tablet 1  . metoprolol succinate (TOPROL-XL) 25 MG 24 hr tablet TAKE 1/2 TABLET EVERY DAY 45 tablet 3  .  nicotine (NICODERM CQ) 7 mg/24hr patch Place 1 patch (7 mg total) onto the skin daily. 28 patch 0  . nitroGLYCERIN (NITROSTAT) 0.4 MG SL tablet Place 1 tablet (0.4 mg total) under the tongue every 5 (five) minutes as needed for chest pain. 25 tablet 12  . nortriptyline (PAMELOR) 50 MG capsule Take 50 mg by mouth at bedtime. 2 capsules at bedtime.    [name]  oxycodone (ROXICODONE) 30 MG immediate release tablet Take 15 mg by mouth every 4 (four) hours as needed for pain.     . polyethylene glycol powder (GLYCOLAX/MIRALAX) powder Take 17 g by mouth daily as needed for moderate constipation. 3350 g 0  . ULORIC 40 MG tablet TAKE 1 TABLET (40 MG TOTAL) BY MOUTH DAILY. ALLOPURINOL INTOLERANT 90 tablet 2  . vitamin B-12 (CYANOCOBALAMIN) 500 MCG tablet Take 500 mcg by mouth daily.    . clindamycin (CLEOCIN T) 1 % external solution Apply topically 2 (two) times daily. 30 mL 0  . mirtazapine (REMERON) 30 MG tablet Take 30 mg by mouth at bedtime.    [name] omeprazole (PRILOSEC) 40 MG capsule Take 1 capsule (40 mg total) by mouth daily. (Patient not taking: Reported on 08/19/2014) 30 capsule 3   No current facility-administered medications for this visit.    ROS:   General:  + weight loss, Fever, chills  HEENT: No recent headaches, no nasal bleeding, no visual changes, no sore throat  Neurologic: No dizziness, blackouts, seizures. No recent symptoms of stroke or mini- stroke. No recent episodes of slurred speech, or temporary blindness.  Cardiac: No recent episodes of chest pain/pressure, no shortness of breath at rest.  + shortness of breath with exertion.  Denies history of atrial fibrillation or irregular heartbeat  Vascular: No history of rest pain in feet.  No history of claudication.  No history of non-healing ulcer, No history of DVT   Pulmonary: No home oxygen, no productive cough, no hemoptysis,  No asthma or wheezing  Musculoskeletal:  [ ]  Arthritis, [x ] Low back pain,  [ ]  Joint  pain  Hematologic:No history of hypercoagulable state.  No history of easy bleeding.  No history of anemia  Gastrointestinal: No hematochezia or melena,  No gastroesophageal reflux, no trouble swallowing  Urinary: [ ]  chronic Kidney disease, [ ]  on HD - [ ]  MWF or [ ]  TTHS, [ ]  Burning with urination, [ ]  Frequent urination, [ ]  Difficulty urinating;   Skin: No rashes  Psychological: No history of anxiety,  No history of depression   Physical Examination  Filed Vitals:   08/19/14 1305  BP: 124/89  Pulse: 87  Resp: 18  Height: 5' 8"  (1.727 m)  Weight: 290 lb 11.2 oz (131.861 kg)    Body mass index is 44.21 kg/(m^2).  General:  Alert and oriented, no acute distress HEENT: Normal Neck: No bruit or JVD Pulmonary: Clear to auscultation bilaterally Cardiac: Regular Rate and Rhythm without murmur Abdomen: Soft, non-tender, non-distended, no mass, no scars, very obese Skin: No rash,  multiple draining sinus tracts serous drainage right inner thigh and scrotal region mild erythema Extremity Pulses:  2+ radial, brachial, femoral, dorsalis pedis pulses bilaterally Musculoskeletal: No deformity or edema  Neurologic: Upper and lower extremity motor 5/5 and symmetric  DATA:  Virtual colonoscopy CT scan was reviewed today. This shows a 5 cm infrarenal abdominal aortic aneurysm. However the aneurysm does appear potentially juxtarenal. There is at least 1 cm of neck below the renal arteries. However these were thick cuts in a non-dedicated CT angiogram. There is no iliac aneurysm component. It is a multilobulated saccular type aneurysm.  Stress test 2014 EF 40% inferior scar   ASSESSMENT:  Asymptomatic abdominal aortic aneurysm now 5 cm diameter with multilobulated saccular appearance   PLAN:  CT angiogram abdomen pelvis for consideration of abdominal aortic aneurysm repair. Discussed with the patient today the possibility of open versus stent graft  repair. If his aneurysm is juxtarenal  and may require an open abdominal operation. However this would be high risk in him due to his severe obesity. We'll make further clarification of the type of repair that would be appropriate for him based on the CT angiogram. Placing a stent also may have some risks due to his chronic hidradenitis suppurativa.  Ruta Hinds, MD [facility] [place] Office: [phone] Pager: [phone]

## 2014-08-19 NOTE — Telephone Encounter (Signed)
Spoke with pt's wife - gave the following info:  CTA [facility] 08/20/14 4:25 pm - no solid food after 12:45 pm Given to [employer] for [place]. - pt's wife verbalized understanding.

## 2014-08-19 NOTE — Addendum Note (Signed)
Addended by: Mena Goes on: 08/19/2014 03:09 PM   Modules accepted: Orders

## 2014-08-20 ENCOUNTER — Ambulatory Visit: Payer: Medicare PPO | Admitting: Family Medicine

## 2014-08-20 ENCOUNTER — Ambulatory Visit
Admission: RE | Admit: 2014-08-20 | Discharge: 2014-08-20 | Disposition: A | Discharge status: Home / Self Care | Payer: Medicare PPO | Source: Ambulatory Visit | Attending: Vascular Surgery | Admitting: Vascular Surgery

## 2014-08-20 DIAGNOSIS — I714 Abdominal aortic aneurysm, without rupture, unspecified: Secondary | ICD-10-CM

## 2014-08-20 DIAGNOSIS — Z01810 Encounter for preprocedural cardiovascular examination: Secondary | ICD-10-CM

## 2014-08-20 MED ORDER — IOPAMIDOL (ISOVUE-370) INJECTION 76%
75.0000 mL | Freq: Once | INTRAVENOUS | Status: AC | PRN
  Administered 2014-08-20: 75 mL via INTRAVENOUS

## 2014-08-30 NOTE — Progress Notes (Signed)
HPI: FU coronary artery disease. He has had prior inferior infarct with PCI of his right coronary artery and PDA. His most recent cardiac catheterization was performed on March 30, 2005. The patient had an ejection fraction of 40% with inferior akinesis at that time. However, his stents in the right coronary artery were widely patent. Note, his most recent echocardiogram on March 30, 2005 showed normal to mildly reduced LV function. Myoview in May of 2014 showed an inferior infarct but no ischemia. Ejection fraction 39%. Also with AAA followed by vascular surgery. Since last seen, he has some dyspnea on exertion but no orthopnea, PND, pedal edema, chest pain or syncope.  Current Outpatient Prescriptions  Medication Sig Dispense Refill  . albuterol (PROVENTIL) (2.5 MG/3ML) 0.083% nebulizer solution USE 1 VIAL PER NEBULIZER EVERY 6 HRS AS NEEDED FOR WHEEZING 75 mL 6  . aspirin 325 MG tablet Take 325 mg by mouth daily.      . Blood Glucose Monitoring Suppl (ONE TOUCH ULTRA SYSTEM KIT) W/DEVICE KIT 1 kit by Does not apply route once. 1 each 0  . Cholecalciferol (VITAMIN D) 2000 UNITS CAPS Take 1 capsule (2,000 Units total) by mouth daily. 30 capsule 11  . clindamycin (CLEOCIN T) 1 % external solution Apply topically 2 (two) times daily. 30 mL 0  . DULoxetine (CYMBALTA) 60 MG capsule TAKE ONE CAPSULE BY MOUTH EVERY DAY 30 capsule 6  . erythromycin with ethanol (EMGEL) 2 % gel Apply topically Daily.     . fentaNYL (DURAGESIC - DOSED MCG/HR) 50 MCG/HR Place 1 patch onto the skin every 3 (three) days.    . fluticasone (FLONASE) 50 MCG/ACT nasal spray     . folic acid (FOLVITE) 1 MG tablet TAKE 1 TABLET BY MOUTH ONCE A DAY 90 tablet 3  . glucose blood (ONE TOUCH ULTRA TEST) test strip Use as instructed.  250.00 100 each 12  . irbesartan (AVAPRO) 75 MG tablet Take 0.5 tablets (37.5 mg total) by mouth daily. 45 tablet 3  . metFORMIN (GLUCOPHAGE) 500 MG tablet Take 0.5 tablets (250 mg total) by  mouth at bedtime. 90 tablet 1  . metoprolol succinate (TOPROL-XL) 25 MG 24 hr tablet TAKE 1/2 TABLET EVERY DAY 45 tablet 3  . nicotine (NICODERM CQ) 7 mg/24hr patch Place 1 patch (7 mg total) onto the skin daily. 28 patch 0  . nitroGLYCERIN (NITROSTAT) 0.4 MG SL tablet Place 1 tablet (0.4 mg total) under the tongue every 5 (five) minutes as needed for chest pain. 25 tablet 12  . nortriptyline (PAMELOR) 50 MG capsule Take 50 mg by mouth at bedtime. 2 capsules at bedtime.    [name] oxyCODONE (ROXICODONE) 15 MG immediate release tablet Take 1 tablet by mouth every 4 (four) hours as needed for pain.     . polyethylene glycol powder (GLYCOLAX/MIRALAX) powder Take 17 g by mouth daily as needed for moderate constipation. 3350 g 0  . ULORIC 40 MG tablet TAKE 1 TABLET (40 MG TOTAL) BY MOUTH DAILY. ALLOPURINOL INTOLERANT 90 tablet 2  . vitamin B-12 (CYANOCOBALAMIN) 500 MCG tablet Take 500 mcg by mouth daily.    . pravastatin (PRAVACHOL) 40 MG tablet Take 1 tablet (40 mg total) by mouth every evening. 30 tablet 11   No current facility-administered medications for this visit.     Past Medical History  Diagnosis Date  . CAD (coronary artery disease)     x3 with stents last 2005, EF 40%, predominantly RCA by CT 2016  .  Ischemic cardiomyopathy     s/p inferior MI  . 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 COPD (chronic obstructive pulmonary disease) 10/2011    minimal by PFTs  . T2DM (type 2 diabetes mellitus)   . Disturbances of sensation of smell and taste   . Gout   . Hidradenitis suppurativa 2011    followd by Lyndle Herrlich - daily bactrim, s/p intralesional steroid injection 10/2010  . HLD (hyperlipidemia)   . HTN (hypertension)   . History of MI (myocardial infarction)   . Obesity   . OSA (obstructive sleep apnea)   . Tobacco use disorder     cigar  . History of non anemic vitamin B12 deficiency     s/p B12 shots, now on oral supplementation (10/2011)  . Vitamin D deficiency     . Weight loss   . Allergic rhinitis   . Cervical spondylosis 05/2010    s/p surgery  . DDD (degenerative disc disease)   . Lumbar herniated disc   . Spinal stenosis     released from [facility].  established with preferred pain (07/2013)  . AAA (abdominal aortic aneurysm) 09/2012    stable 4.5cm CTA abdomen 01/2014  . Hip osteoarthritis     s/p intraarticular steroid shot (12/2012) (Ibazebo/Caffrey)  . History of hepatitis B 1983  . History of viral meningitis 2000  . Chronic pain syndrome     established with [facility] ([place]  . Liver cirrhosis secondary to NASH 01/2014    by CT scan, rec virtual colonoscopy by Dr Collene Mares 06/2014    Past Surgical History  Procedure Laterality Date  . Tonsillectomy and adenoidectomy  1972  . Lumbar disc surgery      L5-S1  . Cervical discectomy  01/2010    Anterior C4-7, cervical cord decompression and B foraminotomy (Botero)(metal plate)  . Lumbar laminectomy  12/11    L2-5laminectomy/foraminotomy for stenosis ([facility]ero)  . Horizon study stenting x4  2000, 2005    in the RCA with kissing balloon angioplasty, of the PDA ostium-5 stents total per patient  . Cardiac catheterization  03/30/05    Left, Left ventriculography, coronary angiography, intravascular [place] of RCA  . Myelogram      L5-S1 and L1-2 spondylosis  . Scrotal US  07/2010    hydradenitis - testes WNL, small R epididymal cyst, small L varicocele, no sinus tract mention  . Colonoscopy  05/06/2007    normal, small int hemorrhoids rpt 5 yrs due to fmhx - rec against rpt colonoscopy by Dr Collene Mares  . Sacroiliac joint injection Bilateral 10/2013    [facility]  . Virtual colonoscopy  07/2014    suboptimal, diverticula, no polyps identified Collene Mares)    History   Social History  . Marital Status: Married    Spouse Name: [name]  . Number of Children: 0  . Years of Education: College   Occupational History  . Dental technician implants, crown and bridge-now disability 2006    Social History  Main Topics  . Smoking status: Former Smoker -- 0.50 packs/day for 43 years    Types: Cigarettes, Pipe    Quit date: 08/04/2014  . Smokeless tobacco: Never Used  . Alcohol Use: No  . Drug Use: No  . Sexual Activity: Not on file   Other Topics Concern  . Not on file   Social History Narrative   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 x 5 years   caffeine: 2 cups coffee, 2  cups soda   Occupation: Neurosurgeon implants, crowne and bridge, now disability 2006   Lives with wife, 1 dog, no children    ROS: chronic back pain but no fevers or chills, productive cough, hemoptysis, dysphasia, odynophagia, melena, hematochezia, dysuria, hematuria, rash, seizure activity, orthopnea, PND, pedal edema, claudication. Remaining systems are negative.  Physical Exam: Well-developed obese in no acute distress.  Skin is warm and dry.  HEENT is normal.  Neck is supple.  Chest is clear to auscultation with normal expansion.  Cardiovascular exam is regular rate and rhythm.  Abdominal exam nontender or distended. No masses palpated. Extremities show no edema. neuro grossly intact  ECG 05/19/2014-sinus rhythm, nonspecific ST changes.

## 2014-09-01 ENCOUNTER — Encounter: Payer: Self-pay | Admitting: Vascular Surgery

## 2014-09-01 ENCOUNTER — Encounter: Payer: Self-pay | Admitting: Cardiology

## 2014-09-01 ENCOUNTER — Ambulatory Visit (INDEPENDENT_AMBULATORY_CARE_PROVIDER_SITE_OTHER): Payer: Medicare PPO | Admitting: Cardiology

## 2014-09-01 VITALS — BP 148/92 | HR 88 | Ht 68.0 in | Wt 295.9 lb

## 2014-09-01 DIAGNOSIS — Z01810 Encounter for preprocedural cardiovascular examination: Secondary | ICD-10-CM | POA: Diagnosis not present

## 2014-09-01 DIAGNOSIS — I2510 Atherosclerotic heart disease of native coronary artery without angina pectoris: Secondary | ICD-10-CM

## 2014-09-01 DIAGNOSIS — I714 Abdominal aortic aneurysm, without rupture, unspecified: Secondary | ICD-10-CM

## 2014-09-01 DIAGNOSIS — F17200 Nicotine dependence, unspecified, uncomplicated: Secondary | ICD-10-CM

## 2014-09-01 DIAGNOSIS — E785 Hyperlipidemia, unspecified: Secondary | ICD-10-CM | POA: Diagnosis not present

## 2014-09-01 DIAGNOSIS — Z720 Tobacco use: Secondary | ICD-10-CM

## 2014-09-01 DIAGNOSIS — I10 Essential (primary) hypertension: Secondary | ICD-10-CM

## 2014-09-01 MED ORDER — PRAVASTATIN SODIUM 40 MG PO TABS: 40.0000 mg | ORAL_TABLET | Freq: Every evening | ORAL | Status: DC

## 2014-09-01 NOTE — Patient Instructions (Addendum)
Your physician wants you to follow-up in: [place] will receive a reminder letter in the mail two months in advance. If you don't receive a letter, please call our office to schedule the follow-up appointment.   START PRAVASTATIN 40 MG ONCE DAILY  Your physician recommends that you return for lab work in: [place] TO LAB WORK

## 2014-09-01 NOTE — Assessment & Plan Note (Signed)
Most recent nuclear study less than 2 years ago showed infarct but no ischemia. No recent chest pain. No further ischemia evaluation indicated preoperatively.

## 2014-09-01 NOTE — Assessment & Plan Note (Signed)
Continue ARB and beta blocker. 

## 2014-09-01 NOTE — Assessment & Plan Note (Signed)
Now followed by vascular surgery. Apparently he will require repair in the near future.

## 2014-09-01 NOTE — Assessment & Plan Note (Signed)
Patient counseled on discontinuing. 

## 2014-09-01 NOTE — Assessment & Plan Note (Signed)
Continue aspirin. Add statin.

## 2014-09-01 NOTE — Assessment & Plan Note (Signed)
Continue present medications. 

## 2014-09-01 NOTE — Assessment & Plan Note (Signed)
Patient did not tolerate Lipitor previously. I will try Pravachol 40 mg daily. Check lipids and liver in 4 weeks.

## 2014-09-02 ENCOUNTER — Encounter: Payer: Self-pay | Admitting: Vascular Surgery

## 2014-09-02 ENCOUNTER — Other Ambulatory Visit: Payer: Self-pay | Admitting: *Deleted

## 2014-09-02 ENCOUNTER — Ambulatory Visit (INDEPENDENT_AMBULATORY_CARE_PROVIDER_SITE_OTHER): Payer: Self-pay | Admitting: Vascular Surgery

## 2014-09-02 VITALS — BP 136/89 | HR 74 | Resp 18 | Ht 68.0 in | Wt 298.0 lb

## 2014-09-02 DIAGNOSIS — I714 Abdominal aortic aneurysm, without rupture, unspecified: Secondary | ICD-10-CM

## 2014-09-02 DIAGNOSIS — Z01810 Encounter for preprocedural cardiovascular examination: Secondary | ICD-10-CM

## 2014-09-02 NOTE — Progress Notes (Signed)
Pt here to discuss AAA repair.  I informed him that his CT images will need to be reformatted with 1 mm cuts to consider fenestrated graft.  I discussed this with Markus Daft from radiology.  Pt will see Dr Trula Slade with the reformatted CT in 2 weeks.    Pt should not be charged for office visit today.  Ruta Hinds, MD [facility] [place] Office: [phone] Pager: [phone]

## 2014-09-07 ENCOUNTER — Encounter: Payer: Self-pay | Admitting: Family Medicine

## 2014-09-09 ENCOUNTER — Other Ambulatory Visit: Payer: Self-pay | Admitting: Vascular Surgery

## 2014-09-09 LAB — CREATININE, SERUM: Creat: 0.9 mg/dL (ref 0.50–1.35)

## 2014-09-09 LAB — BUN: BUN: 16 mg/dL (ref 6–23)

## 2014-09-10 ENCOUNTER — Ambulatory Visit
Admission: RE | Admit: 2014-09-10 | Discharge: 2014-09-10 | Disposition: A | Discharge status: Home / Self Care | Payer: Medicare PPO | Source: Ambulatory Visit | Attending: Vascular Surgery | Admitting: Vascular Surgery

## 2014-09-10 DIAGNOSIS — Z01810 Encounter for preprocedural cardiovascular examination: Secondary | ICD-10-CM

## 2014-09-10 DIAGNOSIS — I714 Abdominal aortic aneurysm, without rupture, unspecified: Secondary | ICD-10-CM

## 2014-09-10 MED ORDER — IOPAMIDOL (ISOVUE-370) INJECTION 76%
80.0000 mL | Freq: Once | INTRAVENOUS | Status: AC | PRN
  Administered 2014-09-10: 80 mL via INTRAVENOUS

## 2014-09-11 ENCOUNTER — Encounter: Payer: Self-pay | Admitting: Surgery

## 2014-09-14 ENCOUNTER — Encounter: Payer: Self-pay | Admitting: Surgery

## 2014-09-14 ENCOUNTER — Ambulatory Visit (INDEPENDENT_AMBULATORY_CARE_PROVIDER_SITE_OTHER): Payer: Medicare PPO | Admitting: Surgery

## 2014-09-14 VITALS — BP 110/74 | HR 92 | Ht 68.0 in | Wt 294.0 lb

## 2014-09-14 DIAGNOSIS — I714 Abdominal aortic aneurysm, without rupture, unspecified: Secondary | ICD-10-CM

## 2014-09-14 NOTE — Progress Notes (Signed)
Patient name: [name] MRN: [id number] DOB: Jul 12, 1955 Sex: male     Chief Complaint  Patient presents with  . Re-evaluation    f/u CTA abd/ pelvis     HISTORY OF PRESENT ILLNESS: The patient comes in today for further discussions regarding his juxtarenal abdominal aortic aneurysm.  The patient thinks that it has increased in size significantly over the past couple years.  Most recently, it measured 5.6 cm.  The patient has a family history of abdominal aortic aneurysms.  The patient is a former smoker.  He has mild COPD.  He has a history of coronary artery disease which is followed by [facility].  He reports having multiple stents placed by Dr. Albertine Patricia in the past.  He denies any chest pain.  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  He does have increasingly significant muscle weakness.  He has lost 50 pounds recently.  He has had multiple herniated lumbar disks and spinal stenosis which have required surgery.  The patient is a diabetic which has been well controlled.  He does suffer from chronic right groin hidradenitis.  He is in the process of being worked up for possible malignancy within his right testicle.  Past Medical History  Diagnosis Date  . CAD (coronary artery disease)     x3 with stents last 2005, EF 40%, predominantly RCA by CT 2016  . Ischemic cardiomyopathy     s/p inferior MI  . 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 COPD (chronic obstructive pulmonary disease) 10/2011    minimal by PFTs  . T2DM (type 2 diabetes mellitus)   . Disturbances of sensation of smell and taste   . Gout   . Hidradenitis suppurativa 2011    followd by Lyndle Herrlich - daily bactrim, s/p intralesional steroid injection 10/2010  . HLD (hyperlipidemia)   . HTN (hypertension)   . History of MI (myocardial infarction)   . Obesity   . OSA (obstructive sleep apnea)   . Tobacco use disorder     cigar  . History of non anemic vitamin B12 deficiency       s/p B12 shots, now on oral supplementation (10/2011)  . Vitamin D deficiency   . Weight loss   . Allergic rhinitis   . Cervical spondylosis 05/2010    s/p surgery  . DDD (degenerative disc disease)   . Lumbar herniated disc   . Spinal stenosis     released from [facility].  established with [facility] (07/2013)  . AAA (abdominal aortic aneurysm) 09/2012    stable 4.5cm CTA abdomen 01/2014  . Hip osteoarthritis     s/p intraarticular steroid shot (12/2012) ([place])  . History of hepatitis B 1983  . History of viral meningitis 2000  . Chronic pain syndrome     [facility]  . Liver cirrhosis secondary to NASH 01/2014    by CT scan, rec virtual colonoscopy by Dr Collene Mares 06/2014  . Hematuria 05/19/2014    S/p uro eval 08/2014 - further eval after AAA repair     Past Surgical History  Procedure Laterality Date  . Tonsillectomy and adenoidectomy  1972  . Lumbar disc surgery      L5-S1  . Cervical discectomy  01/2010    Anterior C4-7, cervical cord decompression and B foraminotomy ([place])(metal plate)  . Lumbar laminectomy  12/11    L2-5laminectomy/foraminotomy for stenosis ([place])  . Horizon study stenting x4  2000, 2005    in the RCA with kissing balloon angioplasty, of the PDA ostium-5 stents total per patient  . Cardiac catheterization  03/30/05    Left, Left ventriculography, coronary angiography, intravascular [place] of RCA  . Myelogram      L5-S1 and L1-2 spondylosis  . Scrotal US  07/2010    hydradenitis - testes WNL, small R epididymal cyst, small L varicocele, no sinus tract mention  . Colonoscopy  05/06/2007    normal, small int hemorrhoids rpt 5 yrs due to fmhx - rec against rpt colonoscopy by Dr Collene Mares  . Sacroiliac joint injection Bilateral 10/2013    [place]  . Virtual colonoscopy  07/2014    suboptimal, diverticula, no polyps identified Collene Mares)    History   Social History  . Marital Status: Married    Spouse Name: [name]  .  Number of Children: 0  . Years of Education: College   Occupational History  . Dental technician implants, crown and bridge-now disability 2006    Social History Main Topics  . Smoking status: Former Smoker -- 0.50 packs/day for 43 years    Types: Cigarettes, Pipe    Quit date: 08/04/2014  . Smokeless tobacco: Never Used  . Alcohol Use: No  . Drug Use: No  . Sexual Activity: Not on file   Other Topics Concern  . Not on file   Social History Narrative   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 x 5 years   caffeine: 2 cups coffee, 2 cups soda   Occupation: Neurosurgeon implants, crowne and bridge, now disability 2006   Lives with wife, 1 dog, no children    Family History  Problem Relation Age of Onset  . Cancer Mother     colon  . Diabetes Mother   . Kidney disease Mother   . Aneurysm Mother     AAA  . Rheum arthritis Mother   . Charcot-Marie-Tooth disease Mother   . Cancer Father     skin  . Heart attack Father   . Cancer Brother     skin  . Coronary artery disease Brother   . Cancer Brother     small cell lung cancer  . Aneurysm Brother     AAA  . Rheum arthritis Sister   . Rheum arthritis Brother     Allergies as of 09/14/2014 - Review Complete 09/14/2014  Allergen Reaction Noted  . Statins Shortness Of Breath 01/22/2012  . Allopurinol Nausea Only 12/18/2012  . Losartan  01/30/2013    Current Outpatient Prescriptions on File Prior to Visit  Medication Sig Dispense Refill  . albuterol (PROVENTIL) (2.5 MG/3ML) 0.083% nebulizer solution USE 1 VIAL PER NEBULIZER EVERY 6 HRS AS NEEDED FOR WHEEZING 75 mL 6  . aspirin 325 MG tablet Take 325 mg by mouth daily.      . Blood Glucose Monitoring Suppl (ONE TOUCH ULTRA SYSTEM KIT) W/DEVICE KIT 1 kit by Does not apply route once. 1 each 0  . Cholecalciferol (VITAMIN D) 2000 UNITS CAPS Take 1 capsule (2,000 Units total) by mouth daily. 30 capsule 11  . clindamycin (CLEOCIN T) 1 % external solution Apply topically 2  (two) times daily. 30 mL 0  . DULoxetine (CYMBALTA) 60 MG capsule TAKE ONE CAPSULE BY MOUTH EVERY DAY 30 capsule 6  . erythromycin with ethanol (EMGEL) 2 % gel Apply topically Daily.     . fentaNYL (DURAGESIC - DOSED MCG/HR) 50 MCG/HR Place 50 mcg onto the skin every 3 (three) days.     [name]  fluticasone (FLONASE) 50 MCG/ACT nasal spray     . folic acid (FOLVITE) 1 MG tablet TAKE 1 TABLET BY MOUTH ONCE A DAY 90 tablet 3  . glucose blood (ONE TOUCH ULTRA TEST) test strip Use as instructed.  250.00 100 each 12  . irbesartan (AVAPRO) 75 MG tablet Take 0.5 tablets (37.5 mg total) by mouth daily. 45 tablet 3  . metFORMIN (GLUCOPHAGE) 500 MG tablet Take 0.5 tablets (250 mg total) by mouth at bedtime. 90 tablet 1  . metoprolol succinate (TOPROL-XL) 25 MG 24 hr tablet TAKE 1/2 TABLET EVERY DAY 45 tablet 3  . nicotine (NICODERM CQ) 7 mg/24hr patch Place 1 patch (7 mg total) onto the skin daily. 28 patch 0  . nitroGLYCERIN (NITROSTAT) 0.4 MG SL tablet Place 1 tablet (0.4 mg total) under the tongue every 5 (five) minutes as needed for chest pain. 25 tablet 12  . nortriptyline (PAMELOR) 50 MG capsule Take 50 mg by mouth at bedtime. 2 capsules at bedtime.    [name] oxyCODONE (ROXICODONE) 15 MG immediate release tablet Take 1 tablet by mouth every 4 (four) hours as needed for pain.     . polyethylene glycol powder (GLYCOLAX/MIRALAX) powder Take 17 g by mouth daily as needed for moderate constipation. 3350 g 0  . pravastatin (PRAVACHOL) 40 MG tablet Take 1 tablet (40 mg total) by mouth every evening. 30 tablet 11  . ULORIC 40 MG tablet TAKE 1 TABLET (40 MG TOTAL) BY MOUTH DAILY. ALLOPURINOL INTOLERANT 90 tablet 2  . vitamin B-12 (CYANOCOBALAMIN) 500 MCG tablet Take 500 mcg by mouth daily.     No current facility-administered medications on file prior to visit.     REVIEW OF SYSTEMS:  General: + weight loss, Fever, chills  HEENT: No recent headaches, no nasal bleeding, no visual changes, no sore  throat  Neurologic: No dizziness, blackouts, seizures. No recent symptoms of stroke or mini- stroke. No recent episodes of slurred speech, or temporary blindness.  Cardiac: No recent episodes of chest pain/pressure, no shortness of breath at rest. + shortness of breath with exertion. Denies history of atrial fibrillation or irregular heartbeat  Vascular: No history of rest pain in feet. No history of claudication. No history of non-healing ulcer, No history of DVT   Pulmonary: No home oxygen, no productive cough, no hemoptysis, No asthma or wheezing  Musculoskeletal: _0  Arthritis, [x ] Low back pain, _1  Joint pain  Hematologic:No history of hypercoagulable state. No history of easy bleeding. No history of anemia  Gastrointestinal: No hematochezia or melena, No gastroesophageal reflux, no trouble swallowing  Urinary: _2  chronic Kidney disease, _3  on HD - _4  MWF or _5  TTHS, _6  Burning with urination, _7  Frequent urination, _8  Difficulty urinating;   Skin: No rashes  Psychological: No history of anxiety, No history of depression   PHYSICAL EXAMINATION:   Vital signs are  Filed Vitals:   09/14/14 1547  BP: 110/74  Pulse: 92  Height: _9  (1.727 m)  Weight: 294 lb (133.358 kg)  SpO2: 98%   Body mass index is 44.71 kg/(m^2). General: The patient appears their stated age. HEENT:  No gross abnormalities Pulmonary:  Non labored breathing Abdomen: Soft and non-tender Musculoskeletal: There are no major deformities. Neurologic: No focal weakness or paresthesias are detected, Skin: There are no ulcer or rashes noted. Psychiatric: The patient has normal affect. Cardiovascular: There is a regular rate and rhythm without significant murmur appreciated.   Diagnostic  Studies I have reviewed his CT and gram which showed the following findings: Bilobed infrarenal abdominal aortic aneurysm is noted with maximum measured AP diameter of 5.6 cm. No significant  infrarenal neck is noted, but aneurysm does not extend to aortic bifurcation.  There is the interval development of enlarged mesenteric lymph nodes in the right lower quadrant since prior exam. These most likely are inflammatory in origin. Stable right external iliac lymph nodes are noted with the largest measuring 18 x 14 mm.  Assessment: Juxtarenal abdominal aortic aneurysm Plan: I discussed with the patient our options which include open repair versus fenestrated repair I think his anatomy is suitable for a fenestrated repair, however I will need to get this confirmed by looking at his images on [facility].  I will contact him if this can be done sometime this week.  I discussed the risks and benefits of repair.  These include the risk of cardioplegic complications, death, stroke, intestinal ischemia, lower extremity ischemia, and groin complications particularly given his large pannus and chronic hidradenitis   The patient will get a formal cardiology clearance from Dr. Stanford Breed.  I'm also getting baseline preoperative studies to include carotid duplex, lower extremity duplex to look for popliteal aneurysms as well as ABIs to establish a baseline.    Eldridge Abrahams, M.D. [facility] Office: [phone] Pager:  [phone]

## 2014-09-15 NOTE — Addendum Note (Signed)
Addended by: Thresa Ross C on: 09/15/2014 01:57 PM   Modules accepted: Orders

## 2014-09-21 ENCOUNTER — Other Ambulatory Visit (HOSPITAL_COMMUNITY): Payer: Medicare PPO

## 2014-09-21 ENCOUNTER — Encounter (HOSPITAL_COMMUNITY): Payer: Medicare PPO

## 2014-09-23 ENCOUNTER — Other Ambulatory Visit: Payer: Self-pay | Admitting: Surgery

## 2014-09-23 ENCOUNTER — Ambulatory Visit (INDEPENDENT_AMBULATORY_CARE_PROVIDER_SITE_OTHER)
Admission: RE | Admit: 2014-09-23 | Discharge: 2014-09-23 | Disposition: A | Discharge status: Home / Self Care | Payer: Medicare PPO | Source: Ambulatory Visit | Attending: Surgery | Admitting: Surgery

## 2014-09-23 ENCOUNTER — Ambulatory Visit (HOSPITAL_COMMUNITY)
Admission: RE | Admit: 2014-09-23 | Discharge: 2014-09-23 | Disposition: A | Discharge status: Home / Self Care | Payer: Medicare PPO | Source: Ambulatory Visit | Attending: Vascular Surgery | Admitting: Vascular Surgery

## 2014-09-23 DIAGNOSIS — I714 Abdominal aortic aneurysm, without rupture, unspecified: Secondary | ICD-10-CM

## 2014-09-25 ENCOUNTER — Ambulatory Visit
Admit: 2014-09-25 | Disposition: A | Discharge status: Home / Self Care | Payer: Self-pay | Attending: Urology | Admitting: Urology

## 2014-10-08 ENCOUNTER — Ambulatory Visit (INDEPENDENT_AMBULATORY_CARE_PROVIDER_SITE_OTHER): Payer: Medicare PPO | Admitting: Cardiology

## 2014-10-08 VITALS — BP 112/66 | HR 70 | Ht 68.0 in | Wt 297.0 lb

## 2014-10-08 DIAGNOSIS — Z01810 Encounter for preprocedural cardiovascular examination: Secondary | ICD-10-CM | POA: Diagnosis not present

## 2014-10-08 NOTE — Progress Notes (Signed)
10/08/2014 [name]   January 06, 1956  [id number]  Primary Physician Ria Bush, MD Primary Cardiologist: Dr. Stanford Breed  Reason For Visit/CC: Pre-operative Clearance  HPI: The patient is a 59 y/o male, followed by Dr. Stanford Breed, with a history of CAD with prior inferior infarct with PCI of his right coronary artery and PDA. His most recent cardiac catheterization was performed on March 30, 2005. The patient had an ejection fraction of 40% with inferior akinesis at that time. However, his stents in the right coronary artery were widely patent. His most recent echocardiogram on March 30, 2005 showed normal to mildly reduced LV function. Myoview in May of 2014 showed an inferior infarct but no ischemia. Also with AAA followed by vascular surgery. Has HTN, DM and HLD. Non smoker.    He presents to clinic today for pre-operative assessment for surgical clearance for repair of his AAA, which has progressed in size to 5.6 cm.   He denies any anginal symptoms. No exertional chest pain or dyspnea. No orthopnea, PND, LEE, syncope/ near syncope. His EKG today demonstrates NSR w/o ischemia, unchanged compared to prior. BP and HR both well controlled.    Current Outpatient Prescriptions  Medication Sig Dispense Refill  . albuterol (PROVENTIL) (2.5 MG/3ML) 0.083% nebulizer solution USE 1 VIAL PER NEBULIZER EVERY 6 HRS AS NEEDED FOR WHEEZING 75 mL 6  . aspirin 325 MG tablet Take 325 mg by mouth daily.      . Blood Glucose Monitoring Suppl (ONE TOUCH ULTRA SYSTEM KIT) W/DEVICE KIT 1 kit by Does not apply route once. 1 each 0  . Cholecalciferol (VITAMIN D) 2000 UNITS CAPS Take 1 capsule (2,000 Units total) by mouth daily. 30 capsule 11  . clindamycin (CLEOCIN T) 1 % external solution Apply topically 2 (two) times daily. 30 mL 0  . DULoxetine (CYMBALTA) 60 MG capsule TAKE ONE CAPSULE BY MOUTH EVERY DAY 30 capsule 6  . erythromycin with ethanol (EMGEL) 2 % gel Apply topically Daily.     .  fentaNYL (DURAGESIC - DOSED MCG/HR) 50 MCG/HR Place 50 mcg onto the skin every 3 (three) days.     . fluticasone (FLONASE) 50 MCG/ACT nasal spray     . folic acid (FOLVITE) 1 MG tablet TAKE 1 TABLET BY MOUTH ONCE A DAY 90 tablet 3  . glucose blood (ONE TOUCH ULTRA TEST) test strip Use as instructed.  250.00 100 each 12  . irbesartan (AVAPRO) 75 MG tablet Take 0.5 tablets (37.5 mg total) by mouth daily. 45 tablet 3  . metFORMIN (GLUCOPHAGE) 500 MG tablet Take 0.5 tablets (250 mg total) by mouth at bedtime. 90 tablet 1  . metoprolol succinate (TOPROL-XL) 25 MG 24 hr tablet TAKE 1/2 TABLET EVERY DAY 45 tablet 3  . nicotine (NICODERM CQ) 7 mg/24hr patch Place 1 patch (7 mg total) onto the skin daily. 28 patch 0  . nitroGLYCERIN (NITROSTAT) 0.4 MG SL tablet Place 1 tablet (0.4 mg total) under the tongue every 5 (five) minutes as needed for chest pain. 25 tablet 12  . nortriptyline (PAMELOR) 50 MG capsule Take 50 mg by mouth at bedtime. 2 capsules at bedtime.    [name] oxyCODONE (ROXICODONE) 15 MG immediate release tablet Take 1 tablet by mouth every 4 (four) hours as needed for pain.     . polyethylene glycol powder (GLYCOLAX/MIRALAX) powder Take 17 g by mouth daily as needed for moderate constipation. 3350 g 0  . ULORIC 40 MG tablet TAKE 1 TABLET (40 MG TOTAL)  BY MOUTH DAILY. ALLOPURINOL INTOLERANT 90 tablet 2  . vitamin B-12 (CYANOCOBALAMIN) 500 MCG tablet Take 500 mcg by mouth daily.     No current facility-administered medications for this visit.    Allergies  Allergen Reactions  . Statins Shortness Of Breath    Cough, trouble breathing  . Allopurinol Nausea Only  . Losartan     History   Social History  . Marital Status: Married    Spouse Name: [name]  . Number of Children: 0  . Years of Education: College   Occupational History  . Dental technician implants, crown and bridge-now disability 2006    Social History Main Topics  . Smoking status: Former Smoker -- 0.50 packs/day for 43  years    Types: Cigarettes, Pipe    Quit date: 08/04/2014  . Smokeless tobacco: Never Used  . Alcohol Use: No  . Drug Use: No  . Sexual Activity: Not on file   Other Topics Concern  . Not on file   Social History Narrative   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 x 5 years   caffeine: 2 cups coffee, 2 cups soda   Occupation: Neurosurgeon implants, crowne and bridge, now disability 2006   Lives with wife, 1 dog, no children     Review of Systems: General: negative for chills, fever, night sweats or weight changes.  Cardiovascular: negative for chest pain, dyspnea on exertion, edema, orthopnea, palpitations, paroxysmal nocturnal dyspnea or shortness of breath Dermatological: negative for rash Respiratory: negative for cough or wheezing Urologic: negative for hematuria Abdominal: negative for nausea, vomiting, diarrhea, bright red blood per rectum, melena, or hematemesis Neurologic: negative for visual changes, syncope, or dizziness All other systems reviewed and are otherwise negative except as noted above.    Blood pressure 112/66, pulse 70, height 5' 8"  (1.727 m), weight 297 lb (134.718 kg).  General appearance: alert, cooperative and no distress Neck: no carotid bruit and no JVD Lungs: clear to auscultation bilaterally Heart: regular rate and rhythm, S1, S2 normal, no murmur, click, rub or gallop Extremities: no LEE Pulses: 2+ and symmetric Skin: warm and dry Neurologic: Grossly normal  EKG NSR. No ischemia. Unchanged compared to prior in 05/2014  ASSESSMENT AND PLAN:   1. AAA: recent imaging studies have demonstrated size progression to 5.6 cm. Fenestrated repair has been recommended by Dr. Trula Slade. He denies any recurrent CP/ dyspnea or exertional angina. EKG is w/o ischemia and unchanged compared to prior. Given his h/o COPD, DM and CAD, risk of vascular surgery involving the aorta is at least moderate. However, risk for rupture also high given increased size,  thus repair is needed. However, given no EKG changes and lack of anginal symptoms, no pre-operative cardiac w/u is indicated. Can go for surgery. Recommend continuation of BB during the perioperative period. Resume daily ASA for secondary prevention after surgery once cleared by Dr. Trula Slade.   2. CAD: stable w/o angina or EKG changes. Continue ASA, BB and ARB. Intolerant to statins.   3. HTN: well controlled on current regimen.    PLAN  F/U with Dr. Stanford Breed ~4 weeks post AAA repair.   SIMMONS, BRITTAINYPA-C 10/08/2014 8:40 AM

## 2014-10-08 NOTE — Patient Instructions (Signed)
Your physician recommends that you schedule a follow-up appointment After Surgery

## 2014-10-09 ENCOUNTER — Encounter: Payer: Self-pay | Admitting: Cardiology

## 2014-10-28 DIAGNOSIS — Z79899 Other long term (current) drug therapy: Secondary | ICD-10-CM | POA: Diagnosis not present

## 2014-11-06 ENCOUNTER — Other Ambulatory Visit: Payer: Self-pay

## 2014-11-09 DIAGNOSIS — L720 Epidermal cyst: Secondary | ICD-10-CM | POA: Diagnosis not present

## 2014-11-11 ENCOUNTER — Other Ambulatory Visit: Payer: Self-pay

## 2014-11-25 DIAGNOSIS — G629 Polyneuropathy, unspecified: Secondary | ICD-10-CM | POA: Diagnosis not present

## 2014-11-25 DIAGNOSIS — M961 Postlaminectomy syndrome, not elsewhere classified: Secondary | ICD-10-CM | POA: Diagnosis not present

## 2014-11-25 DIAGNOSIS — G894 Chronic pain syndrome: Secondary | ICD-10-CM | POA: Diagnosis not present

## 2014-11-25 DIAGNOSIS — M79606 Pain in leg, unspecified: Secondary | ICD-10-CM | POA: Diagnosis not present

## 2014-11-25 DIAGNOSIS — Z79899 Other long term (current) drug therapy: Secondary | ICD-10-CM | POA: Diagnosis not present

## 2014-11-25 DIAGNOSIS — M1990 Unspecified osteoarthritis, unspecified site: Secondary | ICD-10-CM | POA: Diagnosis not present

## 2014-12-09 ENCOUNTER — Other Ambulatory Visit: Payer: Self-pay | Admitting: *Deleted

## 2014-12-09 ENCOUNTER — Encounter (HOSPITAL_COMMUNITY): Payer: Self-pay | Admitting: Vascular Surgery

## 2014-12-09 ENCOUNTER — Encounter (HOSPITAL_COMMUNITY)
Admission: RE | Admit: 2014-12-09 | Discharge: 2014-12-09 | Disposition: A | Discharge status: Home / Self Care | Payer: Medicare PPO | Source: Ambulatory Visit | Attending: Surgery | Admitting: Surgery

## 2014-12-09 ENCOUNTER — Other Ambulatory Visit (HOSPITAL_COMMUNITY): Payer: Self-pay | Admitting: *Deleted

## 2014-12-09 ENCOUNTER — Encounter (HOSPITAL_COMMUNITY): Payer: Self-pay

## 2014-12-09 DIAGNOSIS — J449 Chronic obstructive pulmonary disease, unspecified: Secondary | ICD-10-CM | POA: Diagnosis not present

## 2014-12-09 DIAGNOSIS — E785 Hyperlipidemia, unspecified: Secondary | ICD-10-CM | POA: Diagnosis not present

## 2014-12-09 DIAGNOSIS — Z01812 Encounter for preprocedural laboratory examination: Secondary | ICD-10-CM | POA: Insufficient documentation

## 2014-12-09 DIAGNOSIS — L732 Hidradenitis suppurativa: Secondary | ICD-10-CM | POA: Insufficient documentation

## 2014-12-09 DIAGNOSIS — E119 Type 2 diabetes mellitus without complications: Secondary | ICD-10-CM | POA: Diagnosis not present

## 2014-12-09 DIAGNOSIS — I10 Essential (primary) hypertension: Secondary | ICD-10-CM | POA: Insufficient documentation

## 2014-12-09 DIAGNOSIS — I2510 Atherosclerotic heart disease of native coronary artery without angina pectoris: Secondary | ICD-10-CM | POA: Insufficient documentation

## 2014-12-09 HISTORY — DX: Acute myocardial infarction, unspecified: I21.9

## 2014-12-09 HISTORY — DX: Inflammatory liver disease, unspecified: K75.9

## 2014-12-09 HISTORY — DX: Hereditary motor and sensory neuropathy: G60.0

## 2014-12-09 HISTORY — DX: Nocturia: R35.1

## 2014-12-09 LAB — GLUCOSE, CAPILLARY: GLUCOSE-CAPILLARY: 123 mg/dL — AB (ref 65–99)

## 2014-12-09 NOTE — Progress Notes (Signed)
Called to see pt at his preoperative visit as he continues to have drainage from the right groin from his hidradenitis suppurativa.  The pt and his wife states he saw Dr. Allyson Sabal and was placed on 2 ABx, but is only to tolerate one (Sulfa as the other makes him vomit.  He has been on ABx for 2 weeks.    Dr. Oneida Alar also in to examine pt and there is still considerable drainage from the right groin.    Will cancel his surgery, which was scheduled for 12/15/14.  Dr. Oneida Alar will try to get Dr. Migdalia Dk to see the pt today in short stay if she is available and has time.   RHYNE, SAMANTHA 12/09/2014 12:12 PM   Pt with flare up of hydradenitis in right groin.  We will need to access his right groin and place prosthetic to fix his aneurysm which would be very high risk of infection in light of the current amount of groin drainage.  Will cancel aneurysm repair for now.  Our office will schedule ASAP appt with Dr Ninfa Linden from General Surgery to eval whether or not he might benefit from excision of the hydradenitis followed by aneurysm repair after this has healed.  He has a  Complex aneurysm which is juxtarenal and 5.6 cm in diameter.  Although the aneurysm has grown some recently, I do not believe risk of rupture outweighs significant risk of graft infection currently.  Ruta Hinds, MD [facility] Office: [phone] Pager: [phone]

## 2014-12-09 NOTE — Pre-Procedure Instructions (Signed)
    [name]  12/09/2014      Your procedure is scheduled on Tuesday, December 15, 2014 at 7:30 AM.   Report to [facility] Entrance "A" Admitting Office at 5:30 AM.   Call this number if you have problems the morning of surgery: [phone]   Any questions prior to day of surgery, please call [phone] between 8 & 4 PM.   Remember:  Do not eat food or drink liquids after midnight Monday, 12/14/14.  Take these medicines the morning of surgery with A SIP OF WATER: Aspirin, Duloxetine (Cymbalta), Metoprolol (Toprol XL), Oxycodone - if needed, Flonase - if needed, Albuterol nebulizer - if needed  Do NOT take any of your diabetic medications the morning of surgery   Do not wear jewelry.  Do not wear lotions, powders, or cologne.  You NOT may wear deodorant.  Men may shave face and neck.  Do not bring valuables to the hospital.  [facility] is not responsible for any belongings or valuables.  Contacts, dentures or bridgework may not be worn into surgery.  Leave your suitcase in the car.  After surgery it may be brought to your room.  For patients admitted to the hospital, discharge time will be determined by your treatment team.  Special instructions:  See "Preparing for Surgery" Instruction sheet.   Please read over the following fact sheets that you were given. Pain Booklet, Coughing and Deep Breathing, Blood Transfusion Information, MRSA Information and Surgical Site Infection Prevention

## 2014-12-09 NOTE — Progress Notes (Signed)
   12/09/14 1119  OBSTRUCTIVE SLEEP APNEA  Have you ever been diagnosed with sleep apnea through a sleep study? Yes (sleep study @ [facility] < 5 years ago; Dr Gwenette Greet evaluated patient and d/c CPAP and placed pt on nebullizer prn)  If yes, do you have and use a CPAP or BPAP machine every night? 0  Do you snore loudly (loud enough to be heard through closed doors)?  0  Do you often feel tired, fatigued, or sleepy during the daytime? 0  Has anyone observed you stop breathing during your sleep? 0  Do you have, or are you being treated for high blood pressure? 1  BMI more than 35 kg/m2? 1  Age over 45 years old? 1  Neck circumference greater than 40 cm/16 inches? 1  Gender: 1

## 2014-12-09 NOTE — Progress Notes (Signed)
Patient aware he will not see Dr Migdalia Dk today. Dr Oneida Alar will make an appointment for him to see Dr Rush Farmer. Patient aware surgery is cancelled  and discharged home.

## 2014-12-12 ENCOUNTER — Other Ambulatory Visit: Payer: Self-pay | Admitting: Family Medicine

## 2014-12-15 ENCOUNTER — Inpatient Hospital Stay (HOSPITAL_COMMUNITY): Admission: RE | Admit: 2014-12-15 | Payer: Medicare PPO | Source: Ambulatory Visit | Admitting: Surgery

## 2014-12-15 ENCOUNTER — Encounter (HOSPITAL_COMMUNITY): Admission: RE | Payer: Self-pay | Source: Ambulatory Visit

## 2014-12-15 SURGERY — ABDOMINAL AORTIC ENDOVASCULAR FENESTRATED STENT GRAFT
Anesthesia: General

## 2014-12-25 ENCOUNTER — Other Ambulatory Visit: Payer: Self-pay | Admitting: Surgery

## 2014-12-25 DIAGNOSIS — L732 Hidradenitis suppurativa: Secondary | ICD-10-CM | POA: Diagnosis not present

## 2014-12-30 ENCOUNTER — Encounter (HOSPITAL_BASED_OUTPATIENT_CLINIC_OR_DEPARTMENT_OTHER): Payer: Self-pay | Admitting: *Deleted

## 2014-12-30 MED ORDER — VANCOMYCIN HCL 10 G IV SOLR
1500.0000 mg | INTRAVENOUS | Status: AC
  Administered 2014-12-31: 1500 mg via INTRAVENOUS
  Filled 2014-12-30 (×2): qty 1500

## 2014-12-30 NOTE — Progress Notes (Addendum)
NOTED EXTENSIVE MEDICAL HX , EF 39%,  AND BMI OF 46.69 . REVIEWED CHART W/ DR MASSAGEE MDA, WHOM WILL BE MDA DOS, HE STATES PT IS NOT APPROPRIATE FOR SURGICAL CENTER.  CALLED OFFICE AND SPOKE W/ [name], OR SCHEDULER FOR DR Ninfa Linden,  TOLD HER ABOUT NEW GUIDELINES THAT ANESTHESIOLOGIST HAVE PUT INTO PLACE. [name] STATED SHE TALK TO TIFFANY AT CENTRALIZED SCHEDULING.

## 2014-12-30 NOTE — Progress Notes (Signed)
   12/30/14 1738  OBSTRUCTIVE SLEEP APNEA  Have you ever been diagnosed with sleep apnea through a sleep study? No  Do you snore loudly (loud enough to be heard through closed doors)?  0  Do you often feel tired, fatigued, or sleepy during the daytime? 1  Has anyone observed you stop breathing during your sleep? 0  Do you have, or are you being treated for high blood pressure? 1  BMI more than 35 kg/m2? 1  Age over 60 years old? 1  Gender: 1

## 2014-12-30 NOTE — H&P (Signed)
[name] 12/25/2014 11:28 AM Location: [facility] Patient #: [id number] DOB: 22-Dec-1955 Married / Language: Undefined / Race: Undefined Male  History of Present Illness (Kyrese Gartman A. Ninfa Linden MD; 12/25/2014 11:44 AM) Patient words: new-hidradentitis.  The patient is a 59 year old male who presents with a complaint of Hidradenitis. This gentleman was referred by Dr. Ruta Hinds for evaluation of his hidradenitis in the right groin. He has an abdominal aortic aneurysm and will be undergoing graft repair. This will require incisions in the groin. He's been having flareup of his hidradenitis in the right groin with draining purulence. His surgery is being delayed until purulence can be controlled in the groin. This needs to be under control before placement of the graft for fear of infection of the graft. He is otherwise without complaints. He has had hidradenitis in his groins and perineum for most of his life. He has had improvement since last course of antibiotics.   Other Problems Malachi Bonds; 12/25/2014 11:33 AM) Arthritis Back Pain Chronic Obstructive Lung Disease Cirrhosis Of Liver Diabetes Mellitus Gastric Ulcer Gastroesophageal Reflux Disease Myocardial infarction Vascular Disease  Past Surgical History Malachi Bonds; 12/25/2014 11:33 AM) Spinal Surgery - Lower Back Spinal Surgery - Neck Spinal Surgery Midback  Diagnostic Studies History Malachi Bonds; 12/25/2014 11:33 AM) Colonoscopy within last year  Allergies Malachi Bonds; 12/25/2014 11:29 AM) Statins Allopurinol Losartan Potassium *ANTIHYPERTENSIVES*  Medication History (Chemira Jones; 12/25/2014 11:33 AM) FentaNYL (50MCG/HR Patch 72HR, Transdermal) Active. OxyCODONE HCl (15MG Tablet, Oral) Active. DULoxetine HCl (60MG Capsule DR Part, Oral) Active. Folic Acid (1MG Tablet, Oral) Active. Irbesartan (75MG Tablet, Oral) Active. MetFORMIN HCl (500MG Tablet, Oral)  Active. Metoprolol Succinate ER (25MG Tablet ER 24HR, Oral) Active. Pravastatin Sodium (40MG Tablet, Oral) Active. Uloric (40MG Tablet, Oral) Active. Aspirin (325MG Tablet, Oral) Active. Albuterol Sulfate ((2.5 MG/3ML)0.083% Nebulized Soln, Inhalation) Active. Hibiclens (4% Liquid, External) Active. Vitamin D3 (2000UNIT Capsule, Oral) Active. Clindamycin Phosphate (1% Solution, External) Active. Fluticasone Furoate (100MCG/ACT Aero Pow Br Act, Inhalation) Active. Nitroglycerin (0.4MG Tab Sublingual, Sublingual) Active. MiraLax (Oral) Active. Vitamin B-12 (500MCG Tab Sublingual, Sublingual) Active. Medications Reconciled  Social History Malachi Bonds; 12/25/2014 11:33 AM) Alcohol use Remotely quit alcohol use. Caffeine use Carbonated beverages, Coffee. Illicit drug use Uses daily. Tobacco use Current every day smoker.  Family History Malachi Bonds; 12/25/2014 11:33 AM) Arthritis Brother, Mother, Sister. Cancer Brother. Colon Cancer Mother. Colon Polyps Sister. Diabetes Mellitus Mother. Heart Disease Father. Thyroid problems Sister.  Review of Systems Malachi Bonds; 12/25/2014 11:33 AM) General Present- Fatigue. Not Present- Appetite Loss, Chills, Fever, Night Sweats, Weight Gain and Weight Loss. Skin Present- Dryness, Non-Healing Wounds and Rash. Not Present- Change in Wart/Mole, Hives, Jaundice, New Lesions and Ulcer. Respiratory Present- Wheezing. Not Present- Bloody sputum, Chronic Cough, Difficulty Breathing and Snoring. Cardiovascular Present- Leg Cramps. Not Present- Chest Pain, Difficulty Breathing Lying Down, Palpitations, Rapid Heart Rate, Shortness of Breath and Swelling of Extremities. Gastrointestinal Present- Bloating and Difficulty Swallowing. Not Present- Abdominal Pain, Bloody Stool, Change in Bowel Habits, Chronic diarrhea, Constipation, Excessive gas, Gets full quickly at meals, Hemorrhoids, Indigestion, Nausea, Rectal Pain and  Vomiting. Male Genitourinary Present- Nocturia. Not Present- Blood in Urine, Change in Urinary Stream, Frequency, Impotence, Painful Urination, Urgency and Urine Leakage. Musculoskeletal Present- Back Pain, Joint Pain, Joint Stiffness, Muscle Pain and Muscle Weakness. Not Present- Swelling of Extremities. Neurological Present- Headaches, Numbness, Tingling, Tremor, Trouble walking and Weakness. Not Present- Decreased Memory, Fainting and Seizures. Hematology Present- Persistent Infections. Not Present- Easy Bruising, Excessive bleeding, Gland  problems and HIV.   Vitals Malachi Bonds; 12/25/2014 11:29 AM) 12/25/2014 11:28 AM Weight: 315.8 lb Height: 68in Body Surface Area: 2.62 m Body Mass Index: 48.02 kg/m Temp.: 98.62F(Oral)  Pulse: 72 (Regular)  BP: 116/86 (Sitting, Left Arm, Standard)    Physical Exam (Mika Griffitts A. Ninfa Linden MD; 12/25/2014 11:44 AM) The physical exam findings are as follows: Well in appearance Lungs clear CV RRR Abdomen soft, non tender Note:On exam, he has chronic skin changes along his groins and anterior thighs consistent with hidradenitis. There is only one area of concern in the groins on the right side which would be near the area of his planned surgery. Currently, there is no purulence    Assessment & Plan (Catrinia Racicot A. Ninfa Linden MD; 12/25/2014 11:47 AM) HIDRADENITIS (705.83  L73.2) Impression: Currently, I would only recommend a local wide excision in the right groin as this is the area that is close to the planned aneurysm surgery. All the other areas are very tiny and closer to the perineum and do not warrant excision at this point. I discussed the surgery with him in detail including the risks of a chronic open wound and the fact this is not curable. Surgery will be scheduled ASAP     Signed by Harl Bowie, MD (12/25/2014 11:49 AM)

## 2014-12-30 NOTE — Progress Notes (Signed)
Pt denies SOB and chest pain. Pt is under the care of Dr. Stanford Breed, cardiology. Pt made aware to stop taking Aspirin, otc vitamins and herbal medications. Do not take any NSAIDs ie: Ibuprofen, Advil, Naproxen or any medication containing Aspirin. Pt verbalized understanding of all pre-op instructions.

## 2014-12-31 ENCOUNTER — Ambulatory Visit (HOSPITAL_BASED_OUTPATIENT_CLINIC_OR_DEPARTMENT_OTHER): Payer: Medicare PPO | Admitting: Anesthesiology

## 2014-12-31 ENCOUNTER — Ambulatory Visit (HOSPITAL_COMMUNITY)
Admission: RE | Admit: 2014-12-31 | Discharge: 2014-12-31 | Disposition: A | Discharge status: Home / Self Care | Payer: Medicare PPO | Source: Ambulatory Visit | Attending: Surgery | Admitting: Surgery

## 2014-12-31 ENCOUNTER — Encounter (HOSPITAL_COMMUNITY)
Admission: RE | Disposition: A | Discharge status: Home / Self Care | Payer: Self-pay | Source: Ambulatory Visit | Attending: Surgery

## 2014-12-31 ENCOUNTER — Encounter (HOSPITAL_COMMUNITY): Payer: Self-pay | Admitting: *Deleted

## 2014-12-31 DIAGNOSIS — I714 Abdominal aortic aneurysm, without rupture: Secondary | ICD-10-CM | POA: Insufficient documentation

## 2014-12-31 DIAGNOSIS — K759 Inflammatory liver disease, unspecified: Secondary | ICD-10-CM | POA: Diagnosis not present

## 2014-12-31 DIAGNOSIS — J449 Chronic obstructive pulmonary disease, unspecified: Secondary | ICD-10-CM | POA: Insufficient documentation

## 2014-12-31 DIAGNOSIS — E119 Type 2 diabetes mellitus without complications: Secondary | ICD-10-CM | POA: Diagnosis not present

## 2014-12-31 DIAGNOSIS — I739 Peripheral vascular disease, unspecified: Secondary | ICD-10-CM | POA: Insufficient documentation

## 2014-12-31 DIAGNOSIS — I2510 Atherosclerotic heart disease of native coronary artery without angina pectoris: Secondary | ICD-10-CM | POA: Insufficient documentation

## 2014-12-31 DIAGNOSIS — F17200 Nicotine dependence, unspecified, uncomplicated: Secondary | ICD-10-CM | POA: Diagnosis not present

## 2014-12-31 DIAGNOSIS — Z7951 Long term (current) use of inhaled steroids: Secondary | ICD-10-CM | POA: Insufficient documentation

## 2014-12-31 DIAGNOSIS — Z79891 Long term (current) use of opiate analgesic: Secondary | ICD-10-CM | POA: Insufficient documentation

## 2014-12-31 DIAGNOSIS — L732 Hidradenitis suppurativa: Secondary | ICD-10-CM | POA: Diagnosis not present

## 2014-12-31 DIAGNOSIS — Z79899 Other long term (current) drug therapy: Secondary | ICD-10-CM | POA: Diagnosis not present

## 2014-12-31 DIAGNOSIS — Z7982 Long term (current) use of aspirin: Secondary | ICD-10-CM | POA: Insufficient documentation

## 2014-12-31 DIAGNOSIS — M5126 Other intervertebral disc displacement, lumbar region: Secondary | ICD-10-CM | POA: Diagnosis not present

## 2014-12-31 DIAGNOSIS — M1990 Unspecified osteoarthritis, unspecified site: Secondary | ICD-10-CM | POA: Diagnosis not present

## 2014-12-31 DIAGNOSIS — I252 Old myocardial infarction: Secondary | ICD-10-CM | POA: Diagnosis not present

## 2014-12-31 DIAGNOSIS — K219 Gastro-esophageal reflux disease without esophagitis: Secondary | ICD-10-CM | POA: Insufficient documentation

## 2014-12-31 DIAGNOSIS — Z6841 Body Mass Index (BMI) 40.0 and over, adult: Secondary | ICD-10-CM | POA: Insufficient documentation

## 2014-12-31 DIAGNOSIS — I10 Essential (primary) hypertension: Secondary | ICD-10-CM | POA: Insufficient documentation

## 2014-12-31 DIAGNOSIS — K7460 Unspecified cirrhosis of liver: Secondary | ICD-10-CM | POA: Insufficient documentation

## 2014-12-31 DIAGNOSIS — Z955 Presence of coronary angioplasty implant and graft: Secondary | ICD-10-CM | POA: Insufficient documentation

## 2014-12-31 DIAGNOSIS — G709 Myoneural disorder, unspecified: Secondary | ICD-10-CM | POA: Insufficient documentation

## 2014-12-31 HISTORY — PX: HYDRADENITIS EXCISION: SHX5243

## 2014-12-31 HISTORY — DX: Dyspnea, unspecified: R06.00

## 2014-12-31 HISTORY — DX: Other forms of dyspnea: R06.09

## 2014-12-31 HISTORY — DX: Gastro-esophageal reflux disease without esophagitis: K21.9

## 2014-12-31 LAB — BLOOD GAS, ARTERIAL
ACID-BASE EXCESS: 3.3 mmol/L — AB (ref 0.0–2.0)
BICARBONATE: 27.4 meq/L — AB (ref 20.0–24.0)
Collection site: 252031
O2 Saturation: 93 %
PATIENT TEMPERATURE: 98.6
PCO2 ART: 41.9 mmHg (ref 35.0–45.0)
PH ART: 7.431 (ref 7.350–7.450)
TCO2: 28.7 mmol/L (ref 0–100)
pO2, Arterial: 67.9 mmHg — ABNORMAL LOW (ref 80.0–100.0)

## 2014-12-31 LAB — COMPREHENSIVE METABOLIC PANEL
ALT: 24 U/L (ref 17–63)
AST: 33 U/L (ref 15–41)
Albumin: 2.9 g/dL — ABNORMAL LOW (ref 3.5–5.0)
Alkaline Phosphatase: 124 U/L (ref 38–126)
Anion gap: 6 (ref 5–15)
BUN: 9 mg/dL (ref 6–20)
CO2: 28 mmol/L (ref 22–32)
Calcium: 8.7 mg/dL — ABNORMAL LOW (ref 8.9–10.3)
Chloride: 103 mmol/L (ref 101–111)
Creatinine, Ser: 0.98 mg/dL (ref 0.61–1.24)
GFR calc non Af Amer: >60 mL/min (ref >60)
Glucose, Bld: 109 mg/dL — ABNORMAL HIGH (ref 65–99)
Potassium: 4 mmol/L (ref 3.5–5.1)
Sodium: 137 mmol/L (ref 135–145)
Total Bilirubin: 0.8 mg/dL (ref 0.3–1.2)
Total Protein: 7.3 g/dL (ref 6.5–8.1)

## 2014-12-31 LAB — PROTIME-INR
INR: 1.2 (ref 0.00–1.49)
PROTHROMBIN TIME: 15.4 s — AB (ref 11.6–15.2)

## 2014-12-31 LAB — TYPE AND SCREEN
ABO/RH(D): O POS
Antibody Screen: NEGATIVE

## 2014-12-31 LAB — CBC
HEMATOCRIT: 42.3 % (ref 39.0–52.0)
Hemoglobin: 14.1 g/dL (ref 13.0–17.0)
MCH: 33.1 pg (ref 26.0–34.0)
MCHC: 33.3 g/dL (ref 30.0–36.0)
MCV: 99.3 fL (ref 78.0–100.0)
PLATELETS: 98 10*3/uL — AB (ref 150–400)
RBC: 4.26 MIL/uL (ref 4.22–5.81)
RDW: 14.9 % (ref 11.5–15.5)
WBC: 7.6 10*3/uL (ref 4.0–10.5)

## 2014-12-31 LAB — GLUCOSE, CAPILLARY
Glucose-Capillary: 98 mg/dL (ref 65–99)
Glucose-Capillary: 96 mg/dL (ref 65–99)

## 2014-12-31 LAB — ABO/RH: ABO/RH(D): O POS

## 2014-12-31 LAB — APTT: APTT: 29 s (ref 24–37)

## 2014-12-31 SURGERY — EXCISION, HIDRADENITIS, INGUINAL REGION
Anesthesia: General | Site: Groin | Laterality: Right

## 2014-12-31 MED ORDER — BUPIVACAINE-EPINEPHRINE 0.5% -1:200000 IJ SOLN
INTRAMUSCULAR | Status: DC | PRN
  Administered 2014-12-31: 20 mL

## 2014-12-31 MED ORDER — EPHEDRINE SULFATE 50 MG/ML IJ SOLN
INTRAMUSCULAR | Status: AC
  Filled 2014-12-31: qty 1

## 2014-12-31 MED ORDER — OXYCODONE-ACETAMINOPHEN 10-325 MG PO TABS: 1.0000 | ORAL_TABLET | ORAL | Status: DC | PRN

## 2014-12-31 MED ORDER — LIDOCAINE HCL (CARDIAC) 20 MG/ML IV SOLN
INTRAVENOUS | Status: DC | PRN
Start: 2014-12-31 — End: 2014-12-31
  Administered 2014-12-31 (×2): 100 mg via INTRAVENOUS

## 2014-12-31 MED ORDER — MIDAZOLAM HCL 2 MG/2ML IJ SOLN
INTRAMUSCULAR | Status: AC
  Filled 2014-12-31: qty 2

## 2014-12-31 MED ORDER — LACTATED RINGERS IV SOLN
INTRAVENOUS | Status: DC | PRN
  Administered 2014-12-31: 07:00:00 via INTRAVENOUS

## 2014-12-31 MED ORDER — PROPOFOL 10 MG/ML IV BOLUS
INTRAVENOUS | Status: AC
  Filled 2014-12-31: qty 20

## 2014-12-31 MED ORDER — PHENYLEPHRINE HCL 10 MG/ML IJ SOLN
INTRAMUSCULAR | Status: DC | PRN
  Administered 2014-12-31 (×2): 120 ug via INTRAVENOUS

## 2014-12-31 MED ORDER — BUPIVACAINE-EPINEPHRINE (PF) 0.5% -1:200000 IJ SOLN
INTRAMUSCULAR | Status: AC
  Filled 2014-12-31: qty 30

## 2014-12-31 MED ORDER — ONDANSETRON HCL 4 MG/2ML IJ SOLN
INTRAMUSCULAR | Status: DC | PRN
  Administered 2014-12-31: 12 mg via INTRAVENOUS

## 2014-12-31 MED ORDER — PROPOFOL 10 MG/ML IV BOLUS
INTRAVENOUS | Status: DC | PRN
  Administered 2014-12-31: 200 mg via INTRAVENOUS

## 2014-12-31 MED ORDER — HYDROMORPHONE HCL 1 MG/ML IJ SOLN: 0.2500 mg | INTRAMUSCULAR | Status: DC | PRN

## 2014-12-31 MED ORDER — LIDOCAINE HCL (CARDIAC) 20 MG/ML IV SOLN
INTRAVENOUS | Status: AC
  Filled 2014-12-31: qty 5

## 2014-12-31 MED ORDER — KETAMINE HCL 100 MG/ML IJ SOLN
INTRAMUSCULAR | Status: AC
  Filled 2014-12-31: qty 1

## 2014-12-31 MED ORDER — METOPROLOL SUCCINATE 12.5 MG HALF TABLET
12.5000 mg | ORAL_TABLET | Freq: Once | ORAL | Status: AC
  Administered 2014-12-31: 12.5 mg via ORAL
  Filled 2014-12-31: qty 1

## 2014-12-31 MED ORDER — KETAMINE HCL 10 MG/ML IJ SOLN
INTRAMUSCULAR | Status: DC | PRN
  Administered 2014-12-31: 20 mg via INTRAVENOUS
  Administered 2014-12-31: 30 mg via INTRAVENOUS

## 2014-12-31 MED ORDER — CLINDAMYCIN PHOSPHATE 1 % EX SOLN: Freq: Two times a day (BID) | CUTANEOUS | Status: DC

## 2014-12-31 MED ORDER — PHENYLEPHRINE 40 MCG/ML (10ML) SYRINGE FOR IV PUSH (FOR BLOOD PRESSURE SUPPORT)
PREFILLED_SYRINGE | INTRAVENOUS | Status: AC
  Filled 2014-12-31: qty 10

## 2014-12-31 MED ORDER — SUCCINYLCHOLINE CHLORIDE 20 MG/ML IJ SOLN
INTRAMUSCULAR | Status: DC | PRN
  Administered 2014-12-31: 100 mg via INTRAVENOUS

## 2014-12-31 MED ORDER — FENTANYL CITRATE (PF) 100 MCG/2ML IJ SOLN
INTRAMUSCULAR | Status: DC | PRN
  Administered 2014-12-31: 250 ug via INTRAVENOUS

## 2014-12-31 MED ORDER — FENTANYL CITRATE (PF) 250 MCG/5ML IJ SOLN
INTRAMUSCULAR | Status: AC
  Filled 2014-12-31: qty 5

## 2014-12-31 MED ORDER — ROCURONIUM BROMIDE 50 MG/5ML IV SOLN
INTRAVENOUS | Status: AC
  Filled 2014-12-31: qty 1

## 2014-12-31 MED ORDER — CLINDAMYCIN HCL 300 MG PO CAPS: 300.0000 mg | ORAL_CAPSULE | Freq: Three times a day (TID) | ORAL | Status: DC

## 2014-12-31 SURGICAL SUPPLY — 44 items
BLADE SURG ROTATE 9660 (MISCELLANEOUS) IMPLANT
CANISTER SUCTION 2500CC (MISCELLANEOUS) ×2 IMPLANT
CHLORAPREP W/TINT 26ML (MISCELLANEOUS) ×2 IMPLANT
COVER SURGICAL LIGHT HANDLE (MISCELLANEOUS) ×2 IMPLANT
DRAPE LAPAROTOMY TRNSV 102X78 (DRAPE) ×2 IMPLANT
DRAPE UTILITY XL STRL (DRAPES) ×4 IMPLANT
DRSG PAD ABDOMINAL 8X10 ST (GAUZE/BANDAGES/DRESSINGS) ×2 IMPLANT
ELECT CAUTERY BLADE 6.4 (BLADE) ×2 IMPLANT
ELECT REM PT RETURN 9FT ADLT (ELECTROSURGICAL) ×2
ELECTRODE REM PT RTRN 9FT ADLT (ELECTROSURGICAL) ×1 IMPLANT
GAUZE SPONGE 4X4 12PLY STRL (GAUZE/BANDAGES/DRESSINGS) ×2 IMPLANT
GLOVE SURG SIGNA 7.5 PF LTX (GLOVE) ×2 IMPLANT
GOWN STRL REUS W/ TWL LRG LVL3 (GOWN DISPOSABLE) ×1 IMPLANT
GOWN STRL REUS W/ TWL XL LVL3 (GOWN DISPOSABLE) ×1 IMPLANT
GOWN STRL REUS W/TWL LRG LVL3 (GOWN DISPOSABLE) ×1
GOWN STRL REUS W/TWL XL LVL3 (GOWN DISPOSABLE) ×1
KIT BASIN OR (CUSTOM PROCEDURE TRAY) ×2 IMPLANT
KIT ROOM TURNOVER OR (KITS) ×2 IMPLANT
LIQUID BAND (GAUZE/BANDAGES/DRESSINGS) ×2 IMPLANT
MICROMATRIX 500MG (Tissue) ×2 IMPLANT
NEEDLE HYPO 25GX1X1/2 BEV (NEEDLE) ×2 IMPLANT
NS IRRIG 1000ML POUR BTL (IV SOLUTION) ×2 IMPLANT
PACK SURGICAL SETUP 50X90 (CUSTOM PROCEDURE TRAY) ×2 IMPLANT
PAD ARMBOARD 7.5X6 YLW CONV (MISCELLANEOUS) ×4 IMPLANT
PENCIL BUTTON HOLSTER BLD 10FT (ELECTRODE) ×2 IMPLANT
SOLUTION PARTIC MCRMTRX 500MG (Tissue) ×1 IMPLANT
SPECIMEN JAR SMALL (MISCELLANEOUS) IMPLANT
SPONGE GAUZE 4X4 12PLY STER LF (GAUZE/BANDAGES/DRESSINGS) ×2 IMPLANT
SPONGE LAP 18X18 X RAY DECT (DISPOSABLE) ×2 IMPLANT
SUT ETHILON 2 0 FS 18 (SUTURE) ×2 IMPLANT
SUT ETHILON 3 0 FSL (SUTURE) IMPLANT
SUT MON AB 4-0 PC3 18 (SUTURE) ×2 IMPLANT
SUT VIC AB 2-0 CT1 27 (SUTURE) ×2
SUT VIC AB 2-0 CT1 TAPERPNT 27 (SUTURE) ×2 IMPLANT
SUT VIC AB 3-0 CT1 27 (SUTURE) ×1
SUT VIC AB 3-0 CT1 TAPERPNT 27 (SUTURE) ×1 IMPLANT
SWAB CULTURE LIQUID MINI MALE (MISCELLANEOUS) ×2 IMPLANT
SYR CONTROL 10ML LL (SYRINGE) ×2 IMPLANT
TAPE CLOTH SURG 4X10 WHT LF (GAUZE/BANDAGES/DRESSINGS) ×2 IMPLANT
TOWEL OR 17X24 6PK STRL BLUE (TOWEL DISPOSABLE) ×2 IMPLANT
TOWEL OR 17X26 10 PK STRL BLUE (TOWEL DISPOSABLE) ×2 IMPLANT
TUBE ANAEROBIC SPECIMEN COL (MISCELLANEOUS) ×2 IMPLANT
TUBE CONNECTING 12X1/4 (SUCTIONS) ×2 IMPLANT
YANKAUER SUCT BULB TIP NO VENT (SUCTIONS) ×2 IMPLANT

## 2014-12-31 NOTE — Op Note (Signed)
WIDE EXCISION HIDRADENITIS GROIN  Procedure Note  CHASON MCIVER 12/31/2014   Pre-op Diagnosis: Hidradanitis in right groin     Post-op Diagnosis: same  Procedure(s): WIDE EXCISION HIDRADENITIS GROIN (12 square cm) PLACEMENT OF Ware-CELL XENOGRAFT POWDER  Surgeon(s): Coralie Keens, MD  Anesthesia: Monitor Anesthesia Care  Staff:  Circulator: April R Green, RN Scrub Person: Charna Elizabeth  Estimated Blood Loss: Minimal                         [name]   Date: 12/31/2014  Time: 7:58 AM

## 2014-12-31 NOTE — Discharge Instructions (Signed)
Ok to shower starting tomorrow  Expect drainage  Dress with dry guaze daily and as needed

## 2014-12-31 NOTE — Anesthesia Postprocedure Evaluation (Signed)
  Anesthesia Post-op Note  Patient: [name]  Procedure(s) Performed: Procedure(s): WIDE EXCISION HIDRADENITIS GROIN (Right)  Patient Location: PACU  Anesthesia Type:General  Level of Consciousness: awake and alert   Airway and Oxygen Therapy: Patient Spontanous Breathing  Post-op Pain: Controlled  Post-op Assessment: Post-op Vital signs reviewed, Patient's Cardiovascular Status Stable and Respiratory Function Stable  Post-op Vital Signs: Reviewed  Filed Vitals:   12/31/14 0900  BP: 130/39  Pulse: 88  Temp: 36.8 C  Resp: 14    Complications: No apparent anesthesia complications

## 2014-12-31 NOTE — Interval H&P Note (Signed)
History and Physical Interval Note: no change in H and P  12/31/2014 7:00 AM  [name]  has presented today for surgery, with the diagnosis of Hidradanitis in Groin  The various methods of treatment have been discussed with the patient and family. After consideration of risks, benefits and other options for treatment, the patient has consented to  Procedure(s): WIDE EXCISION HIDRADENITIS GROIN (N/A) as a surgical intervention .  The patient's history has been reviewed, patient examined, no change in status, stable for surgery.  I have reviewed the patient's chart and labs.  Questions were answered to the patient's satisfaction.     Emera Bussie A

## 2014-12-31 NOTE — Op Note (Signed)
[name], [name] NO.:  [id number]  MEDICAL RECORD NO.:  [id number]  LOCATION:  [facility]                         FACILITY:  [facility]  PHYSICIAN:  Coralie Keens, M.D. DATE OF BIRTH:  05/17/1956  DATE OF PROCEDURE:  12/31/2014 DATE OF DISCHARGE:                              OPERATIVE REPORT   PREOPERATIVE DIAGNOSIS:  Hidradenitis of the right groin.  POSTOPERATIVE DIAGNOSIS:  Hidradenitis of the right groin.  PROCEDURES: 1. Wide excision of hidradenitis, right groin (12 square centimeters     of skin and subcutaneous tissue). 2. Placement of A-cell xenograft powder.  SURGEON:  Coralie Keens, M.D.  ANESTHESIA:  General and 0.5% Marcaine with epinephrine.  ESTIMATED BLOOD LOSS:  Minimal.  INDICATIONS:  This is a 59 year old gentleman, with multiple chronic medical problems, including an abdominal aortic aneurysm.  He is supposed to have an endograft repair of his aneurysm.  He has unfortunately hidradenitis in the right groin with a draining purulence. This area needs to be excised and then controlled before his aneurysm can be repaired.  PROCEDURE IN DETAIL:  The patient was brought to the operating room, identified as [name].  He was placed supine on the operating table and general anesthesia was induced.  His right groin was then prepped and draped in usual sterile fashion.  I performed an elliptical incision with a scalpel around the chronic draining sinus tracts in the inguinal area.  I took this down to the subcutaneous tissue with electrocautery and then excised 12 square centimeters of skin and subcutaneous tissue including all the chronic granulation tissue with the cautery.  Once the tissue was completely removed, I examined the wound and found no other areas of hidradenitis.  I then anesthetized it thoroughly with Marcaine with epinephrine.  I irrigated the wound with saline.  Hemostasis was achieved with cautery.  I then placed  A-cell xenograft powder into the wound.  I then closed subcutaneous tissue with interrupted 3-0 Vicryl sutures and closed the skin with interrupted 2-0 nylon sutures.  Gauze and tape were then applied.  The patient tolerated the procedure well.  All the counts were correct at the end of procedure.  The patient was then extubated in the operating room and taken in a stable condition to the recovery room.     Coralie Keens, M.D.     DB/MEDQ  D:  12/31/2014  T:  12/31/2014  Job:  [id number]

## 2014-12-31 NOTE — Anesthesia Procedure Notes (Signed)
Procedure Name: Intubation Performed by: Duke Salvia Pre-anesthesia Checklist: Patient identified, Emergency Drugs available, Suction available and Patient being monitored Patient Re-evaluated:Patient Re-evaluated prior to inductionOxygen Delivery Method: Circle system utilized Preoxygenation: Pre-oxygenation with 100% oxygen Intubation Type: IV induction and Rapid sequence Laryngoscope Size: Mac and 4 Grade View: Grade II Tube size: 7.5 mm Number of attempts: 1 Airway Equipment and Method: Patient positioned with wedge pillow,  Oral airway and Stylet Placement Confirmation: ETT inserted through vocal cords under direct vision,  positive ETCO2 and breath sounds checked- equal and bilateral Secured at: 23 cm Tube secured with: Tape Dental Injury: Teeth and Oropharynx as per pre-operative assessment

## 2014-12-31 NOTE — Transfer of Care (Signed)
Immediate Anesthesia Transfer of Care Note  Patient: [name]  Procedure(s) Performed: Procedure(s): WIDE EXCISION HIDRADENITIS GROIN (Right)  Patient Location: PACU  Anesthesia Type:General  Level of Consciousness: awake, alert , oriented and patient cooperative  Airway & Oxygen Therapy: Patient Spontanous Breathing and Patient connected to face mask oxygen  Post-op Assessment: Report given to RN, Post -op Vital signs reviewed and stable and Patient moving all extremities  Post vital signs: Reviewed and stable  Last Vitals:  Filed Vitals:   12/31/14 0805  BP: 126/81  Pulse: 97  Temp: 36.9 C  Resp: 17    Complications: No apparent anesthesia complications

## 2014-12-31 NOTE — Anesthesia Preprocedure Evaluation (Addendum)
Anesthesia Evaluation  Patient identified by MRN, date of birth, ID band Patient awake    Reviewed: Allergy & Precautions, H&P , NPO status , Patient's Chart, lab work & pertinent test results, reviewed documented beta blocker date and time   Airway Mallampati: II  TM Distance: >3 FB Neck ROM: full    Dental no notable dental hx. (+) Edentulous Upper, Missing, Dental Advidsory Given   Pulmonary COPD COPD inhaler, Current Smoker,    Pulmonary exam normal       Cardiovascular Exercise Tolerance: Poor METS: < 3 Mets hypertension, Pt. on medications and Pt. on home beta blockers + CAD, + Past MI, + Cardiac Stents, + Peripheral Vascular Disease and + DOE Rhythm:regular Rate:Normal     Neuro/Psych  Neuromuscular disease negative neurological ROS  negative psych ROS   GI/Hepatic negative GI ROS, (+) Cirrhosis -      , Hepatitis -  Endo/Other  diabetes, Type 2, Oral Hypoglycemic AgentsMorbid obesity  Renal/GU negative Renal ROS  negative genitourinary   Musculoskeletal  (+) Arthritis -, Osteoarthritis,    Abdominal   Peds  Hematology negative hematology ROS (+)   Anesthesia Other Findings   Reproductive/Obstetrics negative OB ROS                            Anesthesia Physical Anesthesia Plan  ASA: III  Anesthesia Plan: General   Post-op Pain Management:    Induction: Intravenous  Airway Management Planned: Oral ETT  Additional Equipment:   Intra-op Plan:   Post-operative Plan: Extubation in OR  Informed Consent: I have reviewed the patients History and Physical, chart, labs and discussed the procedure including the risks, benefits and alternatives for the proposed anesthesia with the patient or authorized representative who has indicated his/her understanding and acceptance.   Dental advisory given  Plan Discussed with: CRNA  Anesthesia Plan Comments:         Anesthesia  Quick Evaluation

## 2015-01-01 ENCOUNTER — Encounter (HOSPITAL_COMMUNITY): Payer: Self-pay | Admitting: Surgery

## 2015-01-01 LAB — HEMOGLOBIN A1C
Hgb A1c MFr Bld: 6.2 % — ABNORMAL HIGH (ref 4.8–5.6)
Mean Plasma Glucose: 131 mg/dL

## 2015-01-02 ENCOUNTER — Encounter: Payer: Self-pay | Admitting: Family Medicine

## 2015-01-20 DIAGNOSIS — G629 Polyneuropathy, unspecified: Secondary | ICD-10-CM | POA: Diagnosis not present

## 2015-01-20 DIAGNOSIS — G894 Chronic pain syndrome: Secondary | ICD-10-CM | POA: Diagnosis not present

## 2015-01-20 DIAGNOSIS — M961 Postlaminectomy syndrome, not elsewhere classified: Secondary | ICD-10-CM | POA: Diagnosis not present

## 2015-01-20 DIAGNOSIS — M1990 Unspecified osteoarthritis, unspecified site: Secondary | ICD-10-CM | POA: Diagnosis not present

## 2015-01-20 DIAGNOSIS — Z79899 Other long term (current) drug therapy: Secondary | ICD-10-CM | POA: Diagnosis not present

## 2015-02-22 ENCOUNTER — Encounter: Payer: Self-pay | Admitting: Surgery

## 2015-02-24 ENCOUNTER — Encounter: Payer: Self-pay | Admitting: Surgery

## 2015-02-24 ENCOUNTER — Ambulatory Visit (INDEPENDENT_AMBULATORY_CARE_PROVIDER_SITE_OTHER): Payer: Medicare PPO | Admitting: Surgery

## 2015-02-24 VITALS — BP 109/67 | HR 70 | Temp 98.4°F | Resp 20 | Ht 67.0 in | Wt 310.8 lb

## 2015-02-24 DIAGNOSIS — I714 Abdominal aortic aneurysm, without rupture, unspecified: Secondary | ICD-10-CM

## 2015-02-24 NOTE — Progress Notes (Signed)
Patient name: [name] MRN: [id number] DOB: 04/28/1956 Sex: male     Chief Complaint  Patient presents with  . AAA    Discuss FEVAR     HISTORY OF PRESENT ILLNESS: The patient is back today for follow-up.  He was originally scheduled for fenestrated aneurysm repair, however he developed infected hidradenitis in his right groin which required surgical intervention by Dr. Rush Farmer.  I have been waiting for this incision to heal before proceeding with aneurysm repair.  He states that he is doing fairly well other the wound has almost healed.  He does complain of significant fatigue.  He denies any abdominal pain.  Past Medical History  Diagnosis Date  . Ischemic cardiomyopathy     s/p inferior MI  --  current ef per myoview 39%  . T2DM (type 2 diabetes mellitus)   . Disturbances of sensation of smell and taste   . Gout   . Hidradenitis suppurativa dx 2011   goin and leg crease    followd by Lyndle Herrlich - daily bactrim, s/p intralesional steroid injection 10/2010  . HLD (hyperlipidemia)   . HTN (hypertension)   . Obesity   . Vitamin D deficiency   . Allergic rhinitis   . Lumbar herniated disc   . Spinal stenosis     released from [facility].  established with [facility] (07/2013)  . History of hepatitis B 1983  . History of viral meningitis 2000  . Chronic pain syndrome     [facility]  . Liver cirrhosis secondary to NASH 01/2014    by CT scan, rec virtual colonoscopy by Dr Collene Mares 06/2014  . Nocturia more than twice per night   . CAD (coronary artery disease) cardiologist-  dr Stanford Breed    x3 with stents last 2005, EF 40%, predominantly RCA by CT 2016  . History of MI (myocardial infarction)     2000  &  2005  . AAA (abdominal aortic aneurysm) 09/2012--  monitored by dr Trula Slade    stable 5.6cm CTA abdomen 2016  . COPD (chronic obstructive pulmonary disease) 10/2011    minimal by PFTs  . Dyspnea on exertion   . Cervical spondylosis  05/2010    s/p surgery  . DDD (degenerative disc disease)   . Hip osteoarthritis     s/p intraarticular steroid shot (12/2012) ([facility])  . B12 deficiency   . Hidradenitis     right groin  . Myocardial infarction   . GERD (gastroesophageal reflux disease)   . Hepatitis     hepatitis B  . Charcot Marie Tooth muscular atrophy dx  [phone]    neurologist--  dr love--  type 2 per pt    Past Surgical History  Procedure Laterality Date  . Tonsillectomy and adenoidectomy  1972  . Lumbar disc surgery      L5-S1  . Lumbar laminectomy  05-18-2010    L2--5   laminectomy/foraminotomy for stenosis [facility])  . Myelogram      L5-S1 and L1-2 spondylosis  . Colonoscopy  05/06/2007    normal, small int hemorrhoids rpt 5 yrs due to fmhx - rec against rpt colonoscopy by Dr Collene Mares  . Sacroiliac joint injection Bilateral 10/2013    [facility]  . Coronary angioplasty  2000  dr Stanford Breed    PCI to RCA and PDA  . Coronary angioplasty with stent placement  03-19-2005  dr Gwyndolyn Saxon [place]ney    inferior STEMI--- DES x4 to RCA w/  balloon angioplasty and balloon angioplasty to jailed PDA ostium/  severe hypokinesis of midinferor wall, ef 50%/  dLM 20%,  mLAD 20%,  dCFX 60%  . Cardiac catheterization  03-30-2005  dr Albertine Patricia    ef 40% w/ inferior akinesis/  LM and CFX angiographically normal/  pLAD 30%/   Widely patent stents in RCA and PDA widely patent  . Anterior cervical decomp/discectomy fusion  01-07-2010     C4 -- C7  . Cardiovascular stress test  10-23-2012  dr Stanford Breed    No ischemia/  Moderate scar in the inferior wall, otherwise normal perfusion/  LV ef 39%,  LV wall motion: inferior/ inferolateral hypokinesis  . Hydradenitis excision Right 12/31/2014    Procedure: WIDE EXCISION HIDRADENITIS GROIN; Coralie Keens, MD    Social History   Social History  . Marital Status: Married    Spouse Name: [name]  . Number of Children: 0  . Years of Education: College   Occupational History  . Dental  technician implants, crown and bridge-now disability 2006    Social History Main Topics  . Smoking status: Current Every Day Smoker -- 0.75 packs/day for 53 years    Types: Cigarettes  . Smokeless tobacco: Never Used     Comment: stopped smoking a pipe in 2015  . Alcohol Use: No  . Drug Use: No  . Sexual Activity: Not on file   Other Topics Concern  . Not on file   Social History Narrative   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 x 5 years   caffeine: 2 cups coffee, 2 cups soda   Occupation: Neurosurgeon implants, crowne and bridge, now disability 2006   Lives with wife, 1 dog, no children    Family History  Problem Relation Age of Onset  . Cancer Mother     colon  . Diabetes Mother   . Kidney disease Mother   . Aneurysm Mother     AAA  . Rheum arthritis Mother   . Charcot-Marie-Tooth disease Mother   . Cancer Father     skin  . Heart attack Father   . Cancer Brother     skin  . Coronary artery disease Brother   . Cancer Brother     small cell lung cancer  . Aneurysm Brother     AAA  . Rheum arthritis Sister   . Rheum arthritis Brother     Allergies as of 02/24/2015 - Review Complete 02/24/2015  Allergen Reaction Noted  . Statins Shortness Of Breath 01/22/2012  . Allopurinol Nausea Only 12/18/2012  . Losartan  01/30/2013    Current Outpatient Prescriptions on File Prior to Visit  Medication Sig Dispense Refill  . albuterol (PROVENTIL) (2.5 MG/3ML) 0.083% nebulizer solution USE 1 VIAL PER NEBULIZER EVERY 6 HRS AS NEEDED FOR WHEEZING (Patient not taking: Reported on 02/24/2015) 75 mL 6  . aspirin 325 MG tablet Take 325 mg by mouth daily.      . Cholecalciferol (VITAMIN D) 2000 UNITS CAPS Take 1 capsule (2,000 Units total) by mouth daily. 30 capsule 11  . clindamycin (CLEOCIN) 300 MG capsule Take 1 capsule (300 mg total) by mouth 3 (three) times daily. 30 capsule 0  . clindamycin (CLEOCIN-T) 1 % external solution Apply topically 2 (two) times daily. (Patient  not taking: Reported on 02/24/2015) 30 mL 2  . DULoxetine (CYMBALTA) 60 MG capsule TAKE ONE CAPSULE BY MOUTH EVERY DAY 30 capsule 6  . fentaNYL (DURAGESIC - DOSED MCG/HR) 50 MCG/HR Place 50 mcg onto the  skin every other day.    . fluticasone (FLONASE) 50 MCG/ACT nasal spray Place 2 sprays into both nostrils daily as needed for allergies.     . folic acid (FOLVITE) 1 MG tablet TAKE 1 TABLET BY MOUTH ONCE A DAY 90 tablet 3  . irbesartan (AVAPRO) 75 MG tablet Take 0.5 tablets (37.5 mg total) by mouth daily. 45 tablet 3  . metFORMIN (GLUCOPHAGE) 500 MG tablet TAKE ONE-HALF(1/2) TABLET (250 MG TOTAL) BY MOUTH AT BEDTIME. 90 tablet 1  . metoprolol succinate (TOPROL-XL) 25 MG 24 hr tablet TAKE 1/2 TABLET EVERY DAY 45 tablet 3  . nicotine (NICODERM CQ) 7 mg/24hr patch Place 1 patch (7 mg total) onto the skin daily. (Patient not taking: Reported on 12/31/2014) 28 patch 0  . nitroGLYCERIN (NITROSTAT) 0.4 MG SL tablet Place 1 tablet (0.4 mg total) under the tongue every 5 (five) minutes as needed for chest pain. 25 tablet 12  . oxyCODONE (ROXICODONE) 15 MG immediate release tablet Take 1 tablet by mouth every 4 (four) hours as needed for pain.     [name] oxyCODONE-acetaminophen (PERCOCET) 10-325 MG per tablet Take 1 tablet by mouth every 4 (four) hours as needed for pain. (Patient not taking: Reported on 02/24/2015) 30 tablet 0  . polyethylene glycol powder (GLYCOLAX/MIRALAX) powder Take 17 g by mouth daily as needed for moderate constipation. 3350 g 0  . sulfamethoxazole-trimethoprim (BACTRIM DS,SEPTRA DS) 800-160 MG per tablet Take 1 tablet by mouth 2 (two) times daily.    [name] ULORIC 40 MG tablet TAKE 1 TABLET (40 MG TOTAL) BY MOUTH DAILY. ALLOPURINOL INTOLERANT 90 tablet 2  . vitamin B-12 (CYANOCOBALAMIN) 500 MCG tablet Take 500 mcg by mouth daily.     No current facility-administered medications on file prior to visit.     REVIEW OF SYSTEMS: Cardiovascular: No chest pain, chest pressure, palpitations,  orthopnea, or dyspnea on exertion. No claudication or rest pain,  No history of DVT or phlebitis. Pulmonary: No productive cough, asthma or wheezing. Neurologic: No weakness, paresthesias, aphasia, or amaurosis. No dizziness. Hematologic: No bleeding problems or clotting disorders. Musculoskeletal: No joint pain or joint swelling. Gastrointestinal: No blood in stool or hematemesis Genitourinary: No dysuria or hematuria. Psychiatric:: No history of major depression. Integumentary: Almost healed right groin wound. Constitutional: No fever or chills.  Positive for fatigue  PHYSICAL EXAMINATION:   Vital signs are  Filed Vitals:   02/24/15 0901  BP: 109/67  Pulse: 70  Temp: 98.4 F (36.9 C)  TempSrc: Oral  Resp: 20  Height: 5' 7"  (1.702 m)  Weight: 310 lb 12.8 oz (140.978 kg)  SpO2: 95%   Body mass index is 48.67 kg/(m^2). General: The patient appears their stated age. HEENT:  No gross abnormalities Pulmonary:  Non labored breathing Abdomen: Soft and non-tender Musculoskeletal: There are no major deformities. Neurologic: No focal weakness or paresthesias are detected, Skin: There is a 2 cm x 2 mm wound in the right groin without infection there is a beefy red granulation tissue at its base. Psychiatric: The patient has normal affect.   Diagnostic Studies None  Assessment: Juxtarenal abdominal aortic aneurysm Plan: The patient's right groin has almost healed.  I discussed that I would like for this to heal completely before proceeding with aneurysm repair so as to minimize risk of graft infection.  I am scheduling him for follow-up again in 1 month.  I will most likely need to repeat his CT scan to make sure that the device that was ordered based  on a CT scan in April is still appropriate  V. Leia Alf, M.D. [facility] Office: [phone] Pager:  [phone]

## 2015-03-03 ENCOUNTER — Telehealth: Payer: Self-pay

## 2015-03-03 NOTE — Telephone Encounter (Signed)
Mrs Swindle request refill metformin to [facility]. Advised to contact [facility] and request med transfer from [facility]. Mrs Taras voiced understanding.

## 2015-03-14 ENCOUNTER — Encounter: Payer: Self-pay | Admitting: Family Medicine

## 2015-03-16 DIAGNOSIS — Z79899 Other long term (current) drug therapy: Secondary | ICD-10-CM | POA: Diagnosis not present

## 2015-03-16 DIAGNOSIS — G894 Chronic pain syndrome: Secondary | ICD-10-CM | POA: Diagnosis not present

## 2015-03-17 DIAGNOSIS — M5137 Other intervertebral disc degeneration, lumbosacral region: Secondary | ICD-10-CM | POA: Diagnosis not present

## 2015-03-17 DIAGNOSIS — G894 Chronic pain syndrome: Secondary | ICD-10-CM | POA: Diagnosis not present

## 2015-03-17 DIAGNOSIS — M961 Postlaminectomy syndrome, not elsewhere classified: Secondary | ICD-10-CM | POA: Diagnosis not present

## 2015-03-17 DIAGNOSIS — M47817 Spondylosis without myelopathy or radiculopathy, lumbosacral region: Secondary | ICD-10-CM | POA: Diagnosis not present

## 2015-03-22 ENCOUNTER — Telehealth: Payer: Self-pay

## 2015-03-22 DIAGNOSIS — G8929 Other chronic pain: Secondary | ICD-10-CM

## 2015-03-22 DIAGNOSIS — M47812 Spondylosis without myelopathy or radiculopathy, cervical region: Secondary | ICD-10-CM

## 2015-03-22 DIAGNOSIS — M5030 Other cervical disc degeneration, unspecified cervical region: Secondary | ICD-10-CM

## 2015-03-22 DIAGNOSIS — M1611 Unilateral primary osteoarthritis, right hip: Secondary | ICD-10-CM

## 2015-03-22 NOTE — Telephone Encounter (Signed)
Pt left v/m; pt stopped going to pain mgt that he had been going to for 2 years due to disagreement with doctor at pain clinic; pt wants to have referral and records sent to pain clinic at [facility]; pt request cb.pt last seen f/u appt with Dr Darnell Level 07/22/14.Please advise.

## 2015-03-24 ENCOUNTER — Encounter: Payer: Self-pay | Admitting: Surgery

## 2015-03-24 NOTE — Telephone Encounter (Signed)
Referral placed. It will be up to pain clinic whether they accept pt and may take several weeks to get into new pain clinic.

## 2015-03-26 ENCOUNTER — Other Ambulatory Visit: Payer: Self-pay | Admitting: Family Medicine

## 2015-03-26 MED ORDER — METFORMIN HCL 500 MG PO TABS: ORAL_TABLET | ORAL | Status: DC

## 2015-03-26 NOTE — Telephone Encounter (Signed)
Patient is due for Medicare Wellness exam next month.  Wife notified, will call back to schedule.  30 day refill sent to pharmacy.

## 2015-03-29 ENCOUNTER — Ambulatory Visit (INDEPENDENT_AMBULATORY_CARE_PROVIDER_SITE_OTHER): Payer: Medicare PPO | Admitting: Surgery

## 2015-03-29 ENCOUNTER — Encounter: Payer: Self-pay | Admitting: Surgery

## 2015-03-29 VITALS — BP 115/77 | HR 71 | Ht 67.0 in | Wt 310.8 lb

## 2015-03-29 DIAGNOSIS — I714 Abdominal aortic aneurysm, without rupture, unspecified: Secondary | ICD-10-CM

## 2015-03-29 DIAGNOSIS — Z01810 Encounter for preprocedural cardiovascular examination: Secondary | ICD-10-CM | POA: Diagnosis not present

## 2015-03-29 NOTE — Progress Notes (Signed)
Patient name: [name] MRN: [id number] DOB: 01-18-1956 Sex: male     Chief Complaint  Patient presents with  . Re-evaluation    1 month f/u     HISTORY OF PRESENT ILLNESS: The patient is back today for follow-up. He was originally scheduled for fenestrated aneurysm repair, however he developed infected hidradenitis in his right groin which required surgical intervention by Dr. Rush Farmer. I have been waiting for this incision to heal before proceeding with aneurysm repair.   He recently had issues with his pain clinic and is getting different medications which are not working as well.  He is looking for a new pain clinic doctor.  He did have one episode where he became unsteady on his feet.   Past Medical History  Diagnosis Date  . Ischemic cardiomyopathy     s/p inferior MI  --  current ef per myoview 39%  . T2DM (type 2 diabetes mellitus) ([place])     ABIs WNL 2016  . Disturbances of sensation of smell and taste   . Gout   . Hidradenitis suppurativa dx 2011   goin and leg crease    followd by Lyndle Herrlich - daily bactrim, s/p intralesional steroid injection 10/2010  . HLD (hyperlipidemia)   . HTN (hypertension)   . Obesity   . Vitamin D deficiency   . Allergic rhinitis   . Lumbar herniated disc   . Spinal stenosis     released from [facility].  [facility] ([facility])  . History of hepatitis B 1983  . History of viral meningitis 2000  . Chronic pain syndrome     [facility] with [facility] ([place]  . Liver cirrhosis secondary to NASH 01/2014    by CT scan, rec virtual colonoscopy by Dr Collene Mares 06/2014  . Nocturia more than twice per night   . CAD (coronary artery disease) cardiologist-  dr Stanford Breed    x3 with stents last 2005, EF 40%, predominantly RCA by CT 2016  . History of MI (myocardial infarction)     2000  &  2005  . AAA (abdominal aortic aneurysm) ([place]) 09/2012--  monitored by dr Trula Slade    stable 5.6cm CTA abdomen 2016  . COPD  (chronic obstructive pulmonary disease) ([place]) 10/2011    minimal by PFTs  . Dyspnea on exertion   . Cervical spondylosis 05/2010    s/p surgery  . DDD (degenerative disc disease)   . Hip osteoarthritis     s/p intraarticular steroid shot (12/2012) ([place])  . B12 deficiency   . Hidradenitis     right groin  . Myocardial infarction ([place])   . GERD (gastroesophageal reflux disease)   . Hepatitis     hepatitis B  . Charcot Marie Tooth muscular atrophy dx  [phone]    neurologist--  dr love--  type 2 per pt    Past Surgical History  Procedure Laterality Date  . Tonsillectomy and adenoidectomy  1972  . Lumbar disc surgery      L5-S1  . Lumbar laminectomy  05-18-2010    L2--5   laminectomy/foraminotomy for stenosis [facility])  . Myelogram      L5-S1 and L1-2 spondylosis  . Colonoscopy  05/06/2007    normal, small int hemorrhoids rpt 5 yrs due to fmhx - rec against rpt colonoscopy by Dr Collene Mares  . Sacroiliac joint injection Bilateral 10/2013    [place]  . Coronary angioplasty  2000  dr Stanford Breed    PCI to  RCA and PDA  . Coronary angioplasty with stent placement  03-19-2005  dr Gwyndolyn Saxon downey    inferior STEMI--- DES x4 to RCA w/ balloon angioplasty and balloon angioplasty to jailed PDA ostium/  severe hypokinesis of midinferor wall, ef 50%/  dLM 20%,  mLAD 20%,  dCFX 60%  . Cardiac catheterization  03-30-2005  dr Albertine Patricia    ef 40% w/ inferior akinesis/  LM and CFX angiographically normal/  pLAD 30%/   Widely patent stents in RCA and PDA widely patent  . Anterior cervical decomp/discectomy fusion  01-07-2010     C4 -- C7  . Cardiovascular stress test  10-23-2012  dr Stanford Breed    No ischemia/  Moderate scar in the inferior wall, otherwise normal perfusion/  LV ef 39%,  LV wall motion: inferior/ inferolateral hypokinesis  . Hydradenitis excision Right 12/31/2014    Procedure: WIDE EXCISION HIDRADENITIS GROIN; Coralie Keens, MD    Social History   Social History  . Marital Status:  Married    Spouse Name: [name]  . Number of Children: 0  . Years of Education: College   Occupational History  . Dental technician implants, crown and bridge-now disability 2006    Social History Main Topics  . Smoking status: Current Every Day Smoker -- 0.75 packs/day for 53 years    Types: Cigarettes  . Smokeless tobacco: Never Used     Comment: stopped smoking a pipe in 2015  . Alcohol Use: No  . Drug Use: No  . Sexual Activity: Not on file   Other Topics Concern  . Not on file   Social History Narrative   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 x 5 years   caffeine: 2 cups coffee, 2 cups soda   Occupation: Neurosurgeon implants, crowne and bridge, now disability 2006   Lives with wife, 1 dog, no children    Family History  Problem Relation Age of Onset  . Cancer Mother     colon  . Diabetes Mother   . Kidney disease Mother   . Aneurysm Mother     AAA  . Rheum arthritis Mother   . Charcot-Marie-Tooth disease Mother   . Heart disease Mother     before age 89  . Cancer Father     skin  . Heart attack Father   . Heart disease Father     before age 36  . Cancer Brother     skin  . Coronary artery disease Brother   . Cancer Brother     small cell lung cancer  . Aneurysm Brother     AAA  . Rheum arthritis Sister   . Rheum arthritis Brother     Allergies as of 03/29/2015 - Review Complete 03/29/2015  Allergen Reaction Noted  . Statins Shortness Of Breath 01/22/2012  . Allopurinol Nausea Only 12/18/2012  . Losartan  01/30/2013    Current Outpatient Prescriptions on File Prior to Visit  Medication Sig Dispense Refill  . aspirin 325 MG tablet Take 325 mg by mouth daily.      . Cholecalciferol (VITAMIN D) 2000 UNITS CAPS Take 1 capsule (2,000 Units total) by mouth daily. 30 capsule 11  . clindamycin (CLEOCIN) 300 MG capsule Take 1 capsule (300 mg total) by mouth 3 (three) times daily. 30 capsule 0  . DULoxetine (CYMBALTA) 60 MG capsule TAKE ONE (1)  CAPSULE BY MOUTH EACH DAY 30 capsule 0  . fentaNYL (DURAGESIC - DOSED MCG/HR) 50 MCG/HR Place 50 mcg onto the skin every  other day.    . fluticasone (FLONASE) 50 MCG/ACT nasal spray Place 2 sprays into both nostrils daily as needed for allergies.     . folic acid (FOLVITE) 1 MG tablet TAKE 1 TABLET BY MOUTH ONCE A DAY 90 tablet 3  . irbesartan (AVAPRO) 75 MG tablet Take 0.5 tablets (37.5 mg total) by mouth daily. 45 tablet 3  . metFORMIN (GLUCOPHAGE) 500 MG tablet TAKE ONE-HALF(1/2) TABLET (250 MG TOTAL) BY MOUTH AT BEDTIME. 30 tablet 0  . metoprolol succinate (TOPROL-XL) 25 MG 24 hr tablet TAKE 1/2 TABLET EVERY DAY 45 tablet 3  . nitroGLYCERIN (NITROSTAT) 0.4 MG SL tablet Place 1 tablet (0.4 mg total) under the tongue every 5 (five) minutes as needed for chest pain. 25 tablet 12  . oxyCODONE (ROXICODONE) 15 MG immediate release tablet Take 1 tablet by mouth every 4 (four) hours as needed for pain.     [name] ULORIC 40 MG tablet TAKE 1 TABLET (40 MG TOTAL) BY MOUTH DAILY. ALLOPURINOL INTOLERANT 90 tablet 2  . vitamin B-12 (CYANOCOBALAMIN) 500 MCG tablet Take 500 mcg by mouth daily.    [name] albuterol (PROVENTIL) (2.5 MG/3ML) 0.083% nebulizer solution USE 1 VIAL PER NEBULIZER EVERY 6 HRS AS NEEDED FOR WHEEZING (Patient not taking: Reported on 02/24/2015) 75 mL 6  . clindamycin (CLEOCIN-T) 1 % external solution Apply topically 2 (two) times daily. (Patient not taking: Reported on 02/24/2015) 30 mL 2  . nicotine (NICODERM CQ) 7 mg/24hr patch Place 1 patch (7 mg total) onto the skin daily. (Patient not taking: Reported on 12/31/2014) 28 patch 0  . oxyCODONE-acetaminophen (PERCOCET) 10-325 MG per tablet Take 1 tablet by mouth every 4 (four) hours as needed for pain. (Patient not taking: Reported on 02/24/2015) 30 tablet 0  . polyethylene glycol powder (GLYCOLAX/MIRALAX) powder Take 17 g by mouth daily as needed for moderate constipation. (Patient not taking: Reported on 03/29/2015) 3350 g 0  .  sulfamethoxazole-trimethoprim (BACTRIM DS,SEPTRA DS) 800-160 MG per tablet Take 1 tablet by mouth 2 (two) times daily.     No current facility-administered medications on file prior to visit.     REVIEW OF SYSTEMS: See history of present illness, otherwise all systems are unchanged from prior visit  PHYSICAL EXAMINATION:   Vital signs are  Filed Vitals:   03/29/15 0958  BP: 115/77  Pulse: 71  Height: 5' 7"  (1.702 m)  Weight: 310 lb 12.8 oz (140.978 kg)  SpO2: 100%   Body mass index is 48.67 kg/(m^2). General: The patient appears their stated age. HEENT:  No gross abnormalities Pulmonary:  Non labored breathing Abdomen: Soft and non-tender Musculoskeletal: There are no major deformities. Neurologic: No focal weakness or paresthesias are detected, Skin: Surgical area of the right groin has healed with the exception of one small less than 1 cm area that is very superficial Psychiatric: The patient has normal affect. Cardiovascular: There is a regular rate and rhythm without significant murmur appreciated.   Diagnostic Studies None  Assessment: Juxtarenal abdominal aortic aneurysm Plan: I am going to send the patient for a repeat CT scan since it has been over 6 months since his original scan which the graft was mottled after.  I want to make sure there has not been significant interval change and that the existing graft that his anatomy.  I will have this done within the next month and he will follow up with me afterwards.  At that time I suspect we will be able to schedule his operation.  Eldridge Abrahams, M.D. [facility] Office: [phone] Pager:  [phone]

## 2015-03-30 NOTE — Addendum Note (Signed)
Addended by: Mena Goes on: 03/30/2015 05:15 PM   Modules accepted: Orders

## 2015-04-05 ENCOUNTER — Encounter: Payer: Self-pay | Admitting: Family Medicine

## 2015-04-05 ENCOUNTER — Other Ambulatory Visit: Payer: Self-pay | Admitting: *Deleted

## 2015-04-05 ENCOUNTER — Ambulatory Visit (INDEPENDENT_AMBULATORY_CARE_PROVIDER_SITE_OTHER): Payer: Medicare PPO | Admitting: Family Medicine

## 2015-04-05 VITALS — BP 134/78 | HR 76 | Temp 98.3°F | Wt 309.5 lb

## 2015-04-05 DIAGNOSIS — I714 Abdominal aortic aneurysm, without rupture, unspecified: Secondary | ICD-10-CM

## 2015-04-05 DIAGNOSIS — I10 Essential (primary) hypertension: Secondary | ICD-10-CM

## 2015-04-05 DIAGNOSIS — L732 Hidradenitis suppurativa: Secondary | ICD-10-CM

## 2015-04-05 DIAGNOSIS — Z23 Encounter for immunization: Secondary | ICD-10-CM | POA: Diagnosis not present

## 2015-04-05 DIAGNOSIS — G8929 Other chronic pain: Secondary | ICD-10-CM

## 2015-04-05 MED ORDER — GLUCOSE BLOOD VI STRP: 1.0000 | ORAL_STRIP | Status: DC | PRN

## 2015-04-05 NOTE — Assessment & Plan Note (Signed)
Chronic, stable. Continue current regimen. 

## 2015-04-05 NOTE — Assessment & Plan Note (Signed)
Appreciate surgery care of patient.

## 2015-04-05 NOTE — Patient Instructions (Addendum)
Flu shot today.  Call Dr Jaci Lazier office to ask about new patient appointment. Let me know what they say. Keep an eye on sugars - [name] will go over glucose meter with you.  Schedule medicare wellness visit in 2 months.

## 2015-04-05 NOTE — Assessment & Plan Note (Signed)
Body mass index is 48.46 kg/(m^2).

## 2015-04-05 NOTE — Assessment & Plan Note (Signed)
Pending CT and vascular f/u

## 2015-04-05 NOTE — Assessment & Plan Note (Signed)
Majority of visit spent discussing recent pain clinic issues and referral to new pain clinic. Advised to call [facility] pain clinic to inquire about new pt appointment. We have sent all records of interest already.

## 2015-04-05 NOTE — Progress Notes (Signed)
Pre visit review using our clinic review tool, if applicable. No additional management support is needed unless otherwise documented below in the visit note. 

## 2015-04-05 NOTE — Progress Notes (Signed)
BP 134/78 mmHg  Pulse 76  Temp(Src) 98.3 F (36.8 C) (Oral)  Wt 309 lb 8 oz (140.388 kg)   CC: discuss pain management  Subjective:    Patient ID: [name], male    DOB: [birth year], 58 y.o.   MRN: [id number]  HPI: [name] is a 59 y.o. male presenting on 04/05/2015 for Follow-up   [facility]. Pt requested change in pain management after difference of opinion with pain management physician - pt received narcotics for acute issue by surgeons without letting prescribing MD know ([facility]viously [facility] happy with [facility] but then transitioned to Dr  Andree Elk). Latest receiving 44mg fentanyl, oxycodone/apap 10/3231m gabapentin, and baclofen. [facility]   Has had one hidradenitis surgery by [facility]. Seeing Dr BrTrula Sladeor AAA.   Oh by the way - episode of vertigo 1 wk ago that lasted 5 min associated with slurred speech and disorientation. This has fully resolved.   Relevant past medical, surgical, family and social history reviewed and updated as indicated. Interim medical history since our last visit reviewed. Allergies and medications reviewed and updated. Current Outpatient Prescriptions on File Prior to Visit  Medication Sig  . aspirin 325 MG tablet Take 325 mg by mouth daily.    . Cholecalciferol (VITAMIN D) 2000 UNITS CAPS Take 1 capsule (2,000 Units total) by mouth daily.  . clindamycin (CLEOCIN-T) 1 % external solution Apply topically 2 (two) times daily.  . DULoxetine (CYMBALTA) 60 MG capsule TAKE ONE (1) CAPSULE BY MOUTH EACH DAY  . fentaNYL (DURAGESIC - DOSED MCG/HR) 50 MCG/HR Place 50 mcg onto the skin every 3 (three) days.   . fluticasone (FLONASE) 50 MCG/ACT nasal spray Place 2 sprays into both nostrils daily as needed for allergies.   . folic acid (FOLVITE) 1 MG tablet TAKE 1 TABLET BY MOUTH ONCE A DAY  . irbesartan (AVAPRO) 75 MG tablet Take 0.5 tablets  (37.5 mg total) by mouth daily.  . metFORMIN (GLUCOPHAGE) 500 MG tablet TAKE ONE-HALF(1/2) TABLET (250 MG TOTAL) BY MOUTH AT BEDTIME.  . metoprolol succinate (TOPROL-XL) 25 MG 24 hr tablet TAKE 1/2 TABLET EVERY DAY  . nitroGLYCERIN (NITROSTAT) 0.4 MG SL tablet Place 1 tablet (0.4 mg total) under the tongue every 5 (five) minutes as needed for chest pain.  . Marland KitchenxyCODONE-acetaminophen (PERCOCET) 10-325 MG per tablet Take 1 tablet by mouth every 4 (four) hours as needed for pain.  . polyethylene glycol powder (GLYCOLAX/MIRALAX) powder Take 17 g by mouth daily as needed for moderate constipation.  . Marland KitchenLORIC 40 MG tablet TAKE 1 TABLET (40 MG TOTAL) BY MOUTH DAILY. ALLOPURINOL INTOLERANT  . vitamin B-12 (CYANOCOBALAMIN) 500 MCG tablet Take 500 mcg by mouth daily.  . Marland Kitchenlbuterol (PROVENTIL) (2.5 MG/3ML) 0.083% nebulizer solution USE 1 VIAL PER NEBULIZER EVERY 6 HRS AS NEEDED FOR WHEEZING (Patient not taking: Reported on 02/24/2015)  . nicotine (NICODERM CQ) 7 mg/24hr patch Place 1 patch (7 mg total) onto the skin daily. (Patient not taking: Reported on 12/31/2014)  . oxyCODONE (ROXICODONE) 15 MG immediate release tablet Take 1 tablet by mouth every 4 (four) hours as needed for pain.    No current facility-administered medications on file prior to visit.    Review of Systems Per HPI unless specifically indicated in ROS section     Objective:    BP 134/78 mmHg  Pulse 76  Temp(Src) 98.3 F (36.8  C) (Oral)  Wt 309 lb 8 oz (140.388 kg)  Wt Readings from Last 3 Encounters:  04/05/15 309 lb 8 oz (140.388 kg)  03/29/15 310 lb 12.8 oz (140.978 kg)  02/24/15 310 lb 12.8 oz (140.978 kg)   Body mass index is 48.46 kg/(m^2).  Physical Exam  Constitutional: He appears well-developed and well-nourished. No distress.  Morbidly obese. Walks with cane. Stiff unsteady movements  HENT:  Mouth/Throat: Oropharynx is clear and moist. No oropharyngeal exudate.  Cardiovascular: Normal rate, regular rhythm, normal heart  sounds and intact distal pulses.   No murmur heard. Pulmonary/Chest: Effort normal and breath sounds normal. No respiratory distress. He has no wheezes. He has no rales.  Musculoskeletal: He exhibits edema (nonpitting stiff edema).  Skin: Skin is warm and dry.  Psychiatric: He has a normal mood and affect.  Nursing note and vitals reviewed.  Results for orders placed or performed during the hospital encounter of 12/31/14  Hemoglobin A1c  Result Value Ref Range   Hgb A1c MFr Bld 6.2 (H) 4.8 - 5.6 %   Mean Plasma Glucose 131 mg/dL  APTT  Result Value Ref Range   aPTT 29 24 - 37 seconds  Blood gas, arterial  Result Value Ref Range   pH, Arterial 7.431 7.350 - 7.450   pCO2 arterial 41.9 35.0 - 45.0 mmHg   pO2, Arterial 67.9 (L) 80.0 - 100.0 mmHg   Bicarbonate 27.4 (H) 20.0 - 24.0 mEq/L   TCO2 28.7 0 - 100 mmol/L   Acid-Base Excess 3.3 (H) 0.0 - 2.0 mmol/L   O2 Saturation 93.0 %   Patient temperature 98.6    Collection site [phone]    Drawn by ARTERIAL DRAW    Sample type ARTERIAL DRAW    Allens test (pass/fail) PASS PASS  CBC  Result Value Ref Range   WBC 7.6 4.0 - 10.5 K/uL   RBC 4.26 4.22 - 5.81 MIL/uL   Hemoglobin 14.1 13.0 - 17.0 g/dL   HCT 42.3 39.0 - 52.0 %   MCV 99.3 78.0 - 100.0 fL   MCH 33.1 26.0 - 34.0 pg   MCHC 33.3 30.0 - 36.0 g/dL   RDW 14.9 11.5 - 15.5 %   Platelets 98 (L) 150 - 400 K/uL  Comprehensive metabolic panel  Result Value Ref Range   Sodium 137 135 - 145 mmol/L   Potassium 4.0 3.5 - 5.1 mmol/L   Chloride 103 101 - 111 mmol/L   CO2 28 22 - 32 mmol/L   Glucose, Bld 109 (H) 65 - 99 mg/dL   BUN 9 6 - 20 mg/dL   Creatinine, Ser 0.98 0.61 - 1.24 mg/dL   Calcium 8.7 (L) 8.9 - 10.3 mg/dL   Total Protein 7.3 6.5 - 8.1 g/dL   Albumin 2.9 (L) 3.5 - 5.0 g/dL   AST 33 15 - 41 U/L   ALT 24 17 - 63 U/L   Alkaline Phosphatase 124 38 - 126 U/L   Total Bilirubin 0.8 0.3 - 1.2 mg/dL   GFR calc non Af Amer >60 >60 mL/min   GFR calc Af Amer >60 >60 mL/min    Anion gap 6 5 - 15  Protime-INR  Result Value Ref Range   Prothrombin Time 15.4 (H) 11.6 - 15.2 seconds   INR 1.20 0.00 - 1.49  Glucose, capillary  Result Value Ref Range   Glucose-Capillary 98 65 - 99 mg/dL  Glucose, capillary  Result Value Ref Range   Glucose-Capillary 96 65 - 99 mg/dL  Type and screen  Result Value Ref Range   ABO/RH(D) O POS    Antibody Screen NEG    Sample Expiration 01/03/2015   ABO/Rh  Result Value Ref Range   ABO/RH(D) O POS       Assessment & Plan:  Over 25 minutes were spent face-to-face with the patient during this encounter and >50% of that time was spent on counseling and coordination of care  Problem List Items Addressed This Visit    Obesity, Class III, BMI 40-49.9 (morbid obesity) ([place])    Body mass index is 48.46 kg/(m^2).       Hidradenitis (Chronic)    Appreciate surgery care of patient.       Essential hypertension (Chronic)    Chronic, stable. Continue current regimen.      Chronic pain - Primary    Majority of visit spent discussing recent pain clinic issues and referral to new pain clinic. Advised to call [facility] pain clinic to inquire about new pt appointment. We have sent all records of interest already.      Relevant Medications   baclofen (LIORESAL) 10 MG tablet   gabapentin (NEURONTIN) 300 MG capsule   AAA (abdominal aortic aneurysm) ([place])    Pending CT and vascular f/u       Other Visit Diagnoses    Need for influenza vaccination        Relevant Orders    Flu Vaccine QUAD 36+ mos PF IM (Fluarix & Fluzone Quad PF) (Completed)        Follow up plan: Return in about 2 years (around 04/04/2017), or as needed, for medicare wellness visit.

## 2015-04-09 ENCOUNTER — Other Ambulatory Visit: Payer: Self-pay | Admitting: *Deleted

## 2015-04-09 ENCOUNTER — Other Ambulatory Visit: Payer: Self-pay

## 2015-04-09 DIAGNOSIS — Z01812 Encounter for preprocedural laboratory examination: Secondary | ICD-10-CM

## 2015-04-09 DIAGNOSIS — I714 Abdominal aortic aneurysm, without rupture, unspecified: Secondary | ICD-10-CM

## 2015-04-09 LAB — CREATININE, SERUM: CREATININE: 0.8 mg/dL (ref 0.70–1.25)

## 2015-04-09 MED ORDER — METOPROLOL SUCCINATE ER 25 MG PO TB24: ORAL_TABLET | ORAL | Status: DC

## 2015-04-09 MED ORDER — IRBESARTAN 75 MG PO TABS: 37.5000 mg | ORAL_TABLET | Freq: Every day | ORAL | Status: DC

## 2015-04-12 ENCOUNTER — Inpatient Hospital Stay: Admission: RE | Admit: 2015-04-12 | Payer: Medicare PPO | Source: Ambulatory Visit

## 2015-04-13 DIAGNOSIS — L732 Hidradenitis suppurativa: Secondary | ICD-10-CM | POA: Diagnosis not present

## 2015-04-16 ENCOUNTER — Telehealth: Payer: Self-pay | Admitting: Family Medicine

## 2015-04-16 MED ORDER — OXYCODONE HCL 5 MG PO TABS: 5.0000 mg | ORAL_TABLET | ORAL | Status: DC | PRN

## 2015-04-16 NOTE — Telephone Encounter (Signed)
Please advise 

## 2015-04-16 NOTE — Telephone Encounter (Signed)
Receiving this late. Remind we need more time to fill controlled substances. Will print oxycodone 109m (per wife this is what he is currently on) #30 to last until new pain clinic appt. Placed in [place] box. Also, what is preferred glucose meter brand for his insurance to send in to pharmacy?

## 2015-04-16 NOTE — Telephone Encounter (Signed)
Pt wife called in to make an appt and told me that pt is out of medication and is going through withdrawals.  She stated that pt had been [facility] to [facility] and had a major difference in opinion with the last dr that pt saw.  She states that he was taking oxycodone 79m  And pt is out. Pt has an appt on Tuesday, but is asking if Dr GDarnell Levelcan write a script until pt can be seen by a new pain management doctor.   cb number is [phone]- wife or [phone] Thanks

## 2015-04-19 ENCOUNTER — Ambulatory Visit: Payer: Medicare PPO | Admitting: Surgery

## 2015-04-19 NOTE — Telephone Encounter (Signed)
Pt left v/m; pt was seen last week; pt picking up pain rx today and wants to know if needs to keep 04/20/15 appt at 10:15 or can pt seen pain mgt dr instead. Pt request cb.

## 2015-04-19 NOTE — Telephone Encounter (Signed)
rec f/u with pain clinic assuming he has upcoming pain clinic appointment. Also, what is preferred glucose meter brand for his insurance to send in to pharmacy?

## 2015-04-19 NOTE — Telephone Encounter (Signed)
Patient notified that Rx was ready for pick up.

## 2015-04-20 ENCOUNTER — Ambulatory Visit
Admission: RE | Admit: 2015-04-20 | Discharge: 2015-04-20 | Disposition: A | Discharge status: Home / Self Care | Payer: Medicare PPO | Source: Ambulatory Visit | Attending: Surgery | Admitting: Surgery

## 2015-04-20 ENCOUNTER — Ambulatory Visit: Payer: Medicare PPO | Admitting: Family Medicine

## 2015-04-20 DIAGNOSIS — I714 Abdominal aortic aneurysm, without rupture, unspecified: Secondary | ICD-10-CM

## 2015-04-20 DIAGNOSIS — Z01810 Encounter for preprocedural cardiovascular examination: Secondary | ICD-10-CM

## 2015-04-20 MED ORDER — IOPAMIDOL (ISOVUE-370) INJECTION 76%
75.0000 mL | Freq: Once | INTRAVENOUS | Status: AC | PRN
  Administered 2015-04-20: 75 mL via INTRAVENOUS

## 2015-04-20 NOTE — Telephone Encounter (Signed)
Spoke with patient's wife. They wanted to keep appt to discuss things with you. He got the glucose monitor that was sent in.

## 2015-04-21 ENCOUNTER — Ambulatory Visit (INDEPENDENT_AMBULATORY_CARE_PROVIDER_SITE_OTHER): Payer: Medicare PPO | Admitting: Family Medicine

## 2015-04-21 ENCOUNTER — Encounter: Payer: Self-pay | Admitting: Family Medicine

## 2015-04-21 VITALS — BP 128/88 | HR 96 | Temp 98.2°F | Wt 309.0 lb

## 2015-04-21 DIAGNOSIS — G8929 Other chronic pain: Secondary | ICD-10-CM

## 2015-04-21 MED ORDER — OXYCODONE HCL 15 MG PO TABS: 15.0000 mg | ORAL_TABLET | ORAL | Status: DC | PRN

## 2015-04-21 MED ORDER — FENTANYL 50 MCG/HR TD PT72: 50.0000 ug | MEDICATED_PATCH | TRANSDERMAL | Status: DC

## 2015-04-21 NOTE — Progress Notes (Signed)
Pre visit review using our clinic review tool, if applicable. No additional management support is needed unless otherwise documented below in the visit note. 

## 2015-04-21 NOTE — Progress Notes (Signed)
BP 128/88 mmHg  Pulse 96  Temp(Src) 98.2 F (36.8 C) (Oral)  Wt 309 lb (140.161 kg)   CC:   Subjective:    Patient ID: [name], male    DOB: 09/17/1955, 59 y.o.   MRN: [id number]  HPI: [name] is a 59 y.o. male presenting on 04/21/2015 for Medication Management   See prior note for details. Seen here 2 wks ago, discussed transition in pain clinics due to disagreement with prior pain management physician. Transitioning to Dr Andree Elk at [facility] pain clinic vs Dr Sanjuan Dame at [facility] pain clinic in [place]. Was on fentanyl 95mg 15 patches/day, oxycodone/apap 167m#180, gabapentin and baclofen. Baclofen not helping. Stopped gabapentin - causing nausea.  Recent hidradenitis surgery ([facility]S). Followed by [facility] for known AAA pending clearance from gen surgery prior to AAA repair.  [facility]. Requests pain meds refilled until then.   Relevant past medical, surgical, family and social history reviewed and updated as indicated. Interim medical history since our last visit reviewed. Allergies and medications reviewed and updated. Current Outpatient Prescriptions on File Prior to Visit  Medication Sig  . aspirin 325 MG tablet Take 325 mg by mouth daily.    . baclofen (LIORESAL) 10 MG tablet Take 10 mg by mouth 2 (two) times daily.  . Cholecalciferol (VITAMIN D) 2000 UNITS CAPS Take 1 capsule (2,000 Units total) by mouth daily.  . clindamycin (CLEOCIN-T) 1 % external solution Apply topically 2 (two) times daily.  . DULoxetine (CYMBALTA) 60 MG capsule TAKE ONE (1) CAPSULE BY MOUTH EACH DAY  . fluticasone (FLONASE) 50 MCG/ACT nasal spray Place 2 sprays into both nostrils daily as needed for allergies.   . folic acid (FOLVITE) 1 MG tablet TAKE 1 TABLET BY MOUTH ONCE A DAY  . irbesartan (AVAPRO) 75 MG tablet Take 0.5 tablets (37.5 mg total) by mouth daily.  . metFORMIN (GLUCOPHAGE) 500 MG tablet TAKE ONE-HALF(1/2) TABLET (250 MG TOTAL) BY MOUTH AT  BEDTIME.  . metoprolol succinate (TOPROL-XL) 25 MG 24 hr tablet TAKE 1/2 TABLET EVERY DAY  . polyethylene glycol powder (GLYCOLAX/MIRALAX) powder Take 17 g by mouth daily as needed for moderate constipation.  . Marland KitchenLORIC 40 MG tablet TAKE 1 TABLET (40 MG TOTAL) BY MOUTH DAILY. ALLOPURINOL INTOLERANT  . vitamin B-12 (CYANOCOBALAMIN) 500 MCG tablet Take 500 mcg by mouth daily.  . Marland Kitchenlbuterol (PROVENTIL) (2.5 MG/3ML) 0.083% nebulizer solution USE 1 VIAL PER NEBULIZER EVERY 6 HRS AS NEEDED FOR WHEEZING (Patient not taking: Reported on 02/24/2015)  . glucose blood test strip 1 each by Other route as needed for other. Use as instructed to check sugar once daily and as needed. **ONE TOUCH ULTRA** Dx: E11.9  . nitroGLYCERIN (NITROSTAT) 0.4 MG SL tablet Place 1 tablet (0.4 mg total) under the tongue every 5 (five) minutes as needed for chest pain. (Patient not taking: Reported on 04/21/2015)   No current facility-administered medications on file prior to visit.    Review of Systems Per HPI unless specifically indicated in ROS section     Objective:    BP 128/88 mmHg  Pulse 96  Temp(Src) 98.2 F (36.8 C) (Oral)  Wt 309 lb (140.161 kg)  Wt Readings from Last 3 Encounters:  04/21/15 309 lb (140.161 kg)  04/05/15 309 lb 8 oz (140.388 kg)  03/29/15 310 lb 12.8 oz (140.978 kg)    Physical Exam  Constitutional: He appears well-developed and well-nourished. No distress.  Morbidly obese. Walks with  cane. Stiff unsteady movements  HENT:  Mouth/Throat: Oropharynx is clear and moist. No oropharyngeal exudate.  Musculoskeletal: He exhibits edema (nonpitting stiff edema).  Skin: Skin is warm and dry.  Psychiatric: He has a normal mood and affect.  Nursing note and vitals reviewed.  Results for orders placed or performed in visit on 04/09/15  Creatinine, serum  Result Value Ref Range   Creat 0.80 0.70 - 1.25 mg/dL      Assessment & Plan:   Problem List Items Addressed This Visit    Chronic pain -  Primary    Pt has appt with new pain clinic scheduled for 05/18/2015. Will refill prior pain regimen until then. provided Rx for fentanyl patch 30mg Q2d #15 and oxycodone 13mQ4hr prn #180, both to fill on 04/24/2015.       Relevant Medications   fentaNYL (DURAGESIC - DOSED MCG/HR) 50 MCG/HR   oxyCODONE (ROXICODONE) 15 MG immediate release tablet       Follow up plan: No Follow-up on file.

## 2015-04-21 NOTE — Patient Instructions (Addendum)
I will refill pain medications until your next appointment with Dr Sanjuan Dame to what you were taking previously.  Keep appointment next month with [facility].

## 2015-04-21 NOTE — Assessment & Plan Note (Signed)
Pt has appt with new pain clinic scheduled for 05/18/2015. Will refill prior pain regimen until then. provided Rx for fentanyl patch 48mg Q2d #15 and oxycodone 179mQ4hr prn #180, both to fill on 04/24/2015.

## 2015-04-22 ENCOUNTER — Telehealth: Payer: Self-pay

## 2015-04-22 NOTE — Telephone Encounter (Signed)
rec'd phone call from pt.  Has appt. With Dr. Trula Slade on 05/03/15.  Is requesting results of CTA from 11/15.  Noted the AAA of 5.6 cm. Is stable compared to CTA of 09/10/14.  Advised that Dr. Trula Slade will discuss results in detail with the pt.  Pt. Reported he has had headache, nausea and upper, mid-abdominal cramps.  Stated the cramping comes and goes.  Stated he had several medication changes, by his previous pain clinic MD, and thinks the medication is causing his symptoms. Denied constipation. Reported he stopped taking one of the new medications, and the headache has gone away.   Admitted to back pain, but related he has had several back procedures.   Also reported he fell approx. 1-2 weeks ago; denied any injury from fall.  Advised that he should go to ER if he starts having increased abdominal pain, with hx of AAA.  Pt. Verb. Understanding.

## 2015-04-27 ENCOUNTER — Encounter: Payer: Self-pay | Admitting: Surgery

## 2015-05-03 ENCOUNTER — Ambulatory Visit (INDEPENDENT_AMBULATORY_CARE_PROVIDER_SITE_OTHER): Payer: Medicare PPO | Admitting: Surgery

## 2015-05-03 ENCOUNTER — Encounter: Payer: Self-pay | Admitting: Surgery

## 2015-05-03 VITALS — BP 134/80 | HR 88 | Ht 67.0 in | Wt 307.0 lb

## 2015-05-03 DIAGNOSIS — I716 Thoracoabdominal aortic aneurysm, without rupture, unspecified: Secondary | ICD-10-CM

## 2015-05-03 NOTE — Progress Notes (Signed)
Patient name: LORIN GAWRON MRN: [id number] DOB: 06/06/1955 Sex: male     Chief Complaint  Patient presents with  . Re-evaluation    3 wk f/u s/p CTA abd/pelvis     HISTORY OF PRESENT ILLNESS:  the patient is back for follow-up. He was originally scheduled for fenestrated endovascular aneurysm repair , however he developed infected hidradenitis which had to be excised.  After waiting for these wounds to heal. He has no new complaits today.   He has significant family history of aneurysm his mother had an abdominal aortic aneurysm. His brother had an abdominal aortic aneurysm. He is a former smoker quit 1 month ago. He does have mild COPD. He also has a history of coronary artery disease with multiple prior coronary stents. He is followed by Dr. Stanford Breed. He has not had any prior abdominal operations. He does have a chronic right groin hidradenitis suppurativa.. Other medical problems include sleep apnea, Charcot-Marie-Tooth, multiple herniated lumbar disks, spinal stenosis, vitamin B12 deficiency, obesity, Nonalcoholic steatohepatitis. He has had some weight loss. His maximum weight was 359 pounds. He is currently down to 290 pounds Past Medical History  Diagnosis Date  . Ischemic cardiomyopathy     s/p inferior MI  --  current ef per myoview 39%  . T2DM (type 2 diabetes mellitus) ([place])     ABIs WNL 2016  . Disturbances of sensation of smell and taste   . Gout   . Hidradenitis suppurativa dx 2011   goin and leg crease    followd by Lyndle Herrlich - daily bactrim, s/p intralesional steroid injection 10/2010  . HLD (hyperlipidemia)   . HTN (hypertension)   . Obesity   . Vitamin D deficiency   . Allergic rhinitis   . Lumbar herniated disc   . Spinal stenosis     released from [place].  [facility] with [facility] (07[facility])  . History of hepatitis B 1983  . History of viral meningitis 2000  . Chronic pain syndrome     [facility] with [facility] ([place]  . Liver  cirrhosis secondary to NASH 01/2014    by CT scan, rec virtual colonoscopy by Dr Collene Mares 06/2014  . Nocturia more than twice per night   . CAD (coronary artery disease) cardiologist-  dr Stanford Breed    x3 with stents last 2005, EF 40%, predominantly RCA by CT 2016  . History of MI (myocardial infarction)     2000  &  2005  . AAA (abdominal aortic aneurysm) ([place]) 09/2012--  monitored by dr Trula Slade    stable 5.6cm CTA abdomen 2016  . COPD (chronic obstructive pulmonary disease) ([place]) 10/2011    minimal by PFTs  . Dyspnea on exertion   . Cervical spondylosis 05/2010    s/p surgery  . DDD (degenerative disc disease)   . Hip osteoarthritis     s/p intraarticular steroid shot (12/2012) ([place])  . B12 deficiency   . Hidradenitis     right groin  . Myocardial infarction ([place])   . GERD (gastroesophageal reflux disease)   . Hepatitis     hepatitis B  . Charcot Marie Tooth muscular atrophy dx  [phone]    neurologist--  dr love--  type 2 per pt    Past Surgical History  Procedure Laterality Date  . Tonsillectomy and adenoidectomy  1972  . Lumbar disc surgery      L5-S1  . Lumbar laminectomy  05-18-2010    L2--5   laminectomy/foraminotomy for  stenosis ([place])  . Myelogram      L5-S1 and L1-2 spondylosis  . Colonoscopy  05/06/2007    normal, small int hemorrhoids rpt 5 yrs due to fmhx - rec against rpt colonoscopy by Dr Collene Mares  . Sacroiliac joint injection Bilateral 10/2013    [place]  . Coronary angioplasty  2000  dr Stanford Breed    PCI to RCA and PDA  . Coronary angioplasty with stent placement  03-19-2005  dr Gwyndolyn Saxon [place]    inferior STEMI--- DES x4 to RCA w/ balloon angioplasty and balloon angioplasty to jailed PDA ostium/  severe hypokinesis of midinferor wall, ef 50%/  dLM 20%,  mLAD 20%,  dCFX 60%  . Cardiac catheterization  03-30-2005  dr Albertine Patricia    ef 40% w/ inferior akinesis/  LM and CFX angiographically normal/  pLAD 30%/   Widely patent stents in RCA and PDA widely patent  .  Anterior cervical decomp/discectomy fusion  01-07-2010     C4 -- C7  . Cardiovascular stress test  10-23-2012  dr Stanford Breed    No ischemia/  Moderate scar in the inferior wall, otherwise normal perfusion/  LV ef 39%,  LV wall motion: inferior/ inferolateral hypokinesis  . Hydradenitis excision Right 12/31/2014    Procedure: WIDE EXCISION HIDRADENITIS GROIN; Coralie Keens, MD    Social History   Social History  . Marital Status: Married    Spouse Name: [name]  . Number of Children: 0  . Years of Education: College   Occupational History  . Dental technician implants, crown and bridge-now disability 2006    Social History Main Topics  . Smoking status: Current Every Day Smoker -- 0.75 packs/day for 53 years    Types: Cigarettes  . Smokeless tobacco: Never Used     Comment: stopped smoking a pipe in 2015  . Alcohol Use: No  . Drug Use: No  . Sexual Activity: Not on file   Other Topics Concern  . Not on file   Social History Narrative   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 x 5 years   caffeine: 2 cups coffee, 2 cups soda   Occupation: Neurosurgeon implants, crowne and bridge, now disability 2006   Lives with wife, 1 dog, no children    Family History  Problem Relation Age of Onset  . Cancer Mother     colon  . Diabetes Mother   . Kidney disease Mother   . Aneurysm Mother     AAA  . Rheum arthritis Mother   . Charcot-Marie-Tooth disease Mother   . Heart disease Mother     before age 75  . Cancer Father     skin  . Heart attack Father   . Heart disease Father     before age 13  . Cancer Brother     skin  . Coronary artery disease Brother   . Cancer Brother     small cell lung cancer  . Aneurysm Brother     AAA  . Rheum arthritis Sister   . Rheum arthritis Brother     Allergies as of 05/03/2015 - Review Complete 05/03/2015  Allergen Reaction Noted  . Statins Shortness Of Breath 01/22/2012  . Allopurinol Nausea Only 12/18/2012  . Gabapentin  Nausea And Vomiting 04/21/2015  . Losartan  01/30/2013    Current Outpatient Prescriptions on File Prior to Visit  Medication Sig Dispense Refill  . aspirin 325 MG tablet Take 325 mg by mouth daily.      . Cholecalciferol (VITAMIN  D) 2000 UNITS CAPS Take 1 capsule (2,000 Units total) by mouth daily. 30 capsule 11  . clindamycin (CLEOCIN-T) 1 % external solution Apply topically 2 (two) times daily. 30 mL 2  . DULoxetine (CYMBALTA) 60 MG capsule TAKE ONE (1) CAPSULE BY MOUTH EACH DAY 30 capsule 0  . fentaNYL (DURAGESIC - DOSED MCG/HR) 50 MCG/HR Place 1 patch (50 mcg total) onto the skin every other day. 15 patch 0  . fluticasone (FLONASE) 50 MCG/ACT nasal spray Place 2 sprays into both nostrils daily as needed for allergies.     . folic acid (FOLVITE) 1 MG tablet TAKE 1 TABLET BY MOUTH ONCE A DAY 90 tablet 3  . glucose blood test strip 1 each by Other route as needed for other. Use as instructed to check sugar once daily and as needed. **ONE TOUCH ULTRA** Dx: E11.9 100 each 3  . irbesartan (AVAPRO) 75 MG tablet Take 0.5 tablets (37.5 mg total) by mouth daily. 45 tablet 6  . metFORMIN (GLUCOPHAGE) 500 MG tablet TAKE ONE-HALF(1/2) TABLET (250 MG TOTAL) BY MOUTH AT BEDTIME. 30 tablet 0  . metoprolol succinate (TOPROL-XL) 25 MG 24 hr tablet TAKE 1/2 TABLET EVERY DAY 45 tablet 6  . oxyCODONE (ROXICODONE) 15 MG immediate release tablet Take 1 tablet (15 mg total) by mouth every 4 (four) hours as needed for severe pain. 180 tablet 0  . polyethylene glycol powder (GLYCOLAX/MIRALAX) powder Take 17 g by mouth daily as needed for moderate constipation. 3350 g 0  . ULORIC 40 MG tablet TAKE 1 TABLET (40 MG TOTAL) BY MOUTH DAILY. ALLOPURINOL INTOLERANT 90 tablet 2  . vitamin B-12 (CYANOCOBALAMIN) 500 MCG tablet Take 500 mcg by mouth daily.    [name] albuterol (PROVENTIL) (2.5 MG/3ML) 0.083% nebulizer solution USE 1 VIAL PER NEBULIZER EVERY 6 HRS AS NEEDED FOR WHEEZING (Patient not taking: Reported on 02/24/2015) 75  mL 6  . baclofen (LIORESAL) 10 MG tablet Take 10 mg by mouth 2 (two) times daily.    . nitroGLYCERIN (NITROSTAT) 0.4 MG SL tablet Place 1 tablet (0.4 mg total) under the tongue every 5 (five) minutes as needed for chest pain. (Patient not taking: Reported on 04/21/2015) 25 tablet 12   No current facility-administered medications on file prior to visit.     REVIEW OF SYSTEMS: Cardiovascular: No chest pain, chest pressure, palpitations, orthopnea, or dyspnea on exertion. No claudication or rest pain,  No history of DVT or phlebitis. Pulmonary: No productive cough, asthma or wheezing. Neurologic: No weakness, paresthesias, aphasia, or amaurosis. No dizziness. Hematologic: No bleeding problems or clotting disorders. Musculoskeletal: No joint pain or joint swelling. Gastrointestinal: No blood in stool or hematemesis Genitourinary: No dysuria or hematuria. Some drainage from his scrotum Psychiatric:: No history of major depression. Integumentary: No rashes or ulcers. Constitutional: No fever or chills.  PHYSICAL EXAMINATION:   Vital signs are  Filed Vitals:   05/03/15 1617  BP: 134/80  Pulse: 88  Height: 5' 7"  (1.702 m)  Weight: 307 lb (139.254 kg)  SpO2: 98%   Body mass index is 48.07 kg/(m^2). General: The patient appears their stated age. HEENT:  No gross abnormalities Pulmonary:  Non labored breathing Abdomen: Soft and non-tender Musculoskeletal: There are no major deformities. Neurologic: No focal weakness or paresthesias are detected, Skin: There are no ulcer or rashes noted. Psychiatric: The patient has normal affect. Cardiovascular: There is a regular rate and rhythm without significant murmur appreciated.   Diagnostic Studies   duplex shows ABIs of 1.0 with triphasic  waveforms   bilaterally   No significant carotid stenosis    no popliteal aneurysm  Assessment:  juxtarenal abdominal aortic aneurysm Plan:  we will proceed with fenestrated endovascular aneurysm  repair in early January.  I discussed the risks and benefits of the procedure.  I particularly stressed the possibility of groin or access complications.  This will attempted to be done percutaneously but may require conversion to open.  He would be at high risk for wound complications.  We also discussed the risk of cardiopulmonary issues as well as possible renal complications potentially leading to dialysis.  Eldridge Abrahams, M.D. [facility] Office: [phone] Pager:  [phone]

## 2015-05-18 ENCOUNTER — Telehealth: Payer: Self-pay | Admitting: Family Medicine

## 2015-05-18 DIAGNOSIS — G894 Chronic pain syndrome: Secondary | ICD-10-CM | POA: Diagnosis not present

## 2015-05-18 DIAGNOSIS — Z5181 Encounter for therapeutic drug level monitoring: Secondary | ICD-10-CM | POA: Diagnosis not present

## 2015-05-18 DIAGNOSIS — M545 Low back pain: Secondary | ICD-10-CM | POA: Diagnosis not present

## 2015-05-18 DIAGNOSIS — Z79891 Long term (current) use of opiate analgesic: Secondary | ICD-10-CM | POA: Diagnosis not present

## 2015-05-18 DIAGNOSIS — F17200 Nicotine dependence, unspecified, uncomplicated: Secondary | ICD-10-CM | POA: Diagnosis not present

## 2015-05-18 DIAGNOSIS — G8929 Other chronic pain: Secondary | ICD-10-CM

## 2015-05-18 DIAGNOSIS — Z720 Tobacco use: Secondary | ICD-10-CM | POA: Diagnosis not present

## 2015-05-18 DIAGNOSIS — Z716 Tobacco abuse counseling: Secondary | ICD-10-CM | POA: Diagnosis not present

## 2015-05-18 DIAGNOSIS — L732 Hidradenitis suppurativa: Secondary | ICD-10-CM | POA: Diagnosis not present

## 2015-05-18 NOTE — Telephone Encounter (Signed)
Pt spouse dropped of note from [facility] for dr g In dr g IN BOX

## 2015-05-21 ENCOUNTER — Other Ambulatory Visit: Payer: Self-pay | Admitting: Family Medicine

## 2015-05-22 NOTE — Telephone Encounter (Signed)
Noted. S/p eval by Dr Leone Payor at [facility] pain clinic in [place] (ph [phone], fax [phone]) Recommends fentanyl 86m TD patch Q48 hrs, oxycodone 142mmax 6 tab/day.  Provided with 1 mo Rx rec PCP continue medication management.  Will await formal office note.

## 2015-05-29 ENCOUNTER — Other Ambulatory Visit: Payer: Self-pay | Admitting: Family Medicine

## 2015-05-29 DIAGNOSIS — K7460 Unspecified cirrhosis of liver: Secondary | ICD-10-CM

## 2015-05-29 DIAGNOSIS — E66813 Obesity, class 3: Secondary | ICD-10-CM

## 2015-05-29 DIAGNOSIS — K7469 Other cirrhosis of liver: Secondary | ICD-10-CM

## 2015-05-29 DIAGNOSIS — Z125 Encounter for screening for malignant neoplasm of prostate: Secondary | ICD-10-CM

## 2015-05-29 DIAGNOSIS — E118 Type 2 diabetes mellitus with unspecified complications: Secondary | ICD-10-CM

## 2015-05-29 DIAGNOSIS — K7581 Nonalcoholic steatohepatitis (NASH): Secondary | ICD-10-CM

## 2015-05-29 DIAGNOSIS — E559 Vitamin D deficiency, unspecified: Secondary | ICD-10-CM

## 2015-05-29 DIAGNOSIS — E785 Hyperlipidemia, unspecified: Secondary | ICD-10-CM

## 2015-05-29 DIAGNOSIS — M109 Gout, unspecified: Secondary | ICD-10-CM

## 2015-05-29 DIAGNOSIS — I10 Essential (primary) hypertension: Secondary | ICD-10-CM

## 2015-06-02 ENCOUNTER — Other Ambulatory Visit: Payer: Self-pay

## 2015-06-02 ENCOUNTER — Other Ambulatory Visit: Payer: Medicare PPO

## 2015-06-10 ENCOUNTER — Encounter: Payer: Medicare PPO | Admitting: Family Medicine

## 2015-06-14 ENCOUNTER — Ambulatory Visit: Payer: Medicare PPO | Admitting: Family Medicine

## 2015-06-18 ENCOUNTER — Encounter: Payer: Self-pay | Admitting: Family Medicine

## 2015-06-18 ENCOUNTER — Ambulatory Visit (INDEPENDENT_AMBULATORY_CARE_PROVIDER_SITE_OTHER): Payer: Medicare PPO | Admitting: Family Medicine

## 2015-06-18 VITALS — BP 138/86 | HR 84 | Temp 98.0°F | Wt 306.0 lb

## 2015-06-18 DIAGNOSIS — G600 Hereditary motor and sensory neuropathy: Secondary | ICD-10-CM

## 2015-06-18 DIAGNOSIS — I714 Abdominal aortic aneurysm, without rupture, unspecified: Secondary | ICD-10-CM

## 2015-06-18 DIAGNOSIS — Z79891 Long term (current) use of opiate analgesic: Secondary | ICD-10-CM | POA: Diagnosis not present

## 2015-06-18 DIAGNOSIS — E559 Vitamin D deficiency, unspecified: Secondary | ICD-10-CM

## 2015-06-18 DIAGNOSIS — E118 Type 2 diabetes mellitus with unspecified complications: Secondary | ICD-10-CM | POA: Diagnosis not present

## 2015-06-18 DIAGNOSIS — G8929 Other chronic pain: Secondary | ICD-10-CM | POA: Diagnosis not present

## 2015-06-18 DIAGNOSIS — Z87891 Personal history of nicotine dependence: Secondary | ICD-10-CM

## 2015-06-18 DIAGNOSIS — E785 Hyperlipidemia, unspecified: Secondary | ICD-10-CM

## 2015-06-18 LAB — LIPID PANEL
CHOL/HDL RATIO: 5
Cholesterol: 168 mg/dL (ref 0–200)
HDL: 32.9 mg/dL — ABNORMAL LOW (ref >39.00)
LDL CALC: 114 mg/dL — AB (ref 0–99)
NONHDL: 134.7
Triglycerides: 102 mg/dL (ref 0.0–149.0)
VLDL: 20.4 mg/dL (ref 0.0–40.0)

## 2015-06-18 LAB — VITAMIN D 25 HYDROXY (VIT D DEFICIENCY, FRACTURES): VITD: 18 ng/mL — AB (ref 30.00–100.00)

## 2015-06-18 LAB — COMPREHENSIVE METABOLIC PANEL
ALBUMIN: 3.1 g/dL — AB (ref 3.5–5.2)
ALT: 22 U/L (ref 0–53)
AST: 43 U/L — AB (ref 0–37)
Alkaline Phosphatase: 126 U/L — ABNORMAL HIGH (ref 39–117)
BILIRUBIN TOTAL: 0.9 mg/dL (ref 0.2–1.2)
BUN: 19 mg/dL (ref 6–23)
CALCIUM: 9.1 mg/dL (ref 8.4–10.5)
CO2: 31 meq/L (ref 19–32)
CREATININE: 0.92 mg/dL (ref 0.40–1.50)
Chloride: 96 mEq/L (ref 96–112)
GFR: 89.11 mL/min (ref >60.00)
Glucose, Bld: 95 mg/dL (ref 70–99)
Potassium: 4.4 mEq/L (ref 3.5–5.1)
Sodium: 131 mEq/L — ABNORMAL LOW (ref 135–145)
Total Protein: 8.9 g/dL — ABNORMAL HIGH (ref 6.0–8.3)

## 2015-06-18 LAB — HEMOGLOBIN A1C: Hgb A1c MFr Bld: 5.8 % (ref 4.6–6.5)

## 2015-06-18 MED ORDER — FENTANYL 50 MCG/HR TD PT72: 50.0000 ug | MEDICATED_PATCH | TRANSDERMAL | Status: DC

## 2015-06-18 MED ORDER — OXYCODONE HCL 15 MG PO TABS: 15.0000 mg | ORAL_TABLET | ORAL | Status: DC | PRN

## 2015-06-18 NOTE — Assessment & Plan Note (Signed)
Congratulated on smoking cessation.

## 2015-06-18 NOTE — Assessment & Plan Note (Signed)
Discussed upcoming surgery

## 2015-06-18 NOTE — Progress Notes (Signed)
Pre visit review using our clinic review tool, if applicable. No additional management support is needed unless otherwise documented below in the visit note. 

## 2015-06-18 NOTE — Assessment & Plan Note (Addendum)
Exam benign today. Will continue to monitor

## 2015-06-18 NOTE — Assessment & Plan Note (Signed)
Check FLP today. 

## 2015-06-18 NOTE — Patient Instructions (Addendum)
I will call pharmacy to cancel out any narcotic prescriptions and provided refill today.  Return in 2-3 months for wellness visit. labwork today. Update UDS today.

## 2015-06-18 NOTE — Assessment & Plan Note (Addendum)
Check labs today.  Foot exam today. rec schedule eye exam and foot exam with podiatrist.

## 2015-06-18 NOTE — Assessment & Plan Note (Addendum)
Reviewed recs from Dr Sanjuan Dame at [facility] pain clinic. Will request office note. Rx refilled for fentanyl/oxycodone today. Cancelled fentanyl he had at pharmacy (had not picked up yet). Update UDS today.

## 2015-06-18 NOTE — Progress Notes (Signed)
BP 138/86 mmHg  Pulse 84  Temp(Src) 98 F (36.7 C) (Oral)  Wt 306 lb (138.801 kg)   CC: med refill visit  Subjective:    Patient ID: DAILY CRATE, male    DOB: Jun 08, 1955, 60 y.o.   MRN: [id number]  HPI: [name] is a 60 y.o. male presenting on 06/18/2015 for Medication Refill   Presents today to f/u on pain management. Saw Dr Sanjuan Dame at [facility] pain clinic. rec Fentanyl patch 70mg Q48 hours + oxycodone 184mQ4 hours prn. Received 1 mo supply 05/18/2015.   07/02/15 pending AAA surgery. Hidradenitis has cleared up.   3/4 ppd smoker - quit 3 wks ago! [place]  Had fall 1 mo ago. Hurt bilateral shoulders - improving. R leg more painful, more weak. Interested in returning to back doctor for steroid injections (after upcoming surgery).   Worsening tremor recently.   Thinks may have had flu last week - URI sxs, abd pain and nausea.   Not fasting today.  ?testosterone.   Diabetic Foot Exam - Simple   Simple Foot Form  Diabetic Foot exam was performed with the following findings:  Yes 06/18/2015 11:05 AM  Visual Inspection  See comments:  Yes  Sensation Testing  See comments:  Yes  Pulse Check  Posterior Tibialis and Dorsalis pulse intact bilaterally:  Yes  Comments  Diminished sensation to light touch and monofilament testing Dry skin throughout      Relevant past medical, surgical, family and social history reviewed and updated as indicated. Interim medical history since our last visit reviewed. Allergies and medications reviewed and updated. Current Outpatient Prescriptions on File Prior to Visit  Medication Sig  . aspirin 325 MG tablet Take 325 mg by mouth daily.    . Cholecalciferol (VITAMIN D) 2000 UNITS CAPS Take 1 capsule (2,000 Units total) by mouth daily.  . clindamycin (CLEOCIN-T) 1 % external solution Apply topically 2 (two) times daily.  . DULoxetine (CYMBALTA) 60 MG capsule TAKE ONE CAPSULE BY MOUTH DAILY  . fluticasone (FLONASE) 50 MCG/ACT  nasal spray Place 2 sprays into both nostrils daily as needed for allergies.   . folic acid (FOLVITE) 1 MG tablet TAKE 1 TABLET BY MOUTH ONCE A DAY  . irbesartan (AVAPRO) 75 MG tablet Take 0.5 tablets (37.5 mg total) by mouth daily.  . metFORMIN (GLUCOPHAGE) 500 MG tablet TAKE ONE-HALF(1/2) TABLET (250 MG TOTAL) BY MOUTH AT BEDTIME.  . metoprolol succinate (TOPROL-XL) 25 MG 24 hr tablet TAKE 1/2 TABLET EVERY DAY  . nitroGLYCERIN (NITROSTAT) 0.4 MG SL tablet Place 1 tablet (0.4 mg total) under the tongue every 5 (five) minutes as needed for chest pain.  . polyethylene glycol powder (GLYCOLAX/MIRALAX) powder Take 17 g by mouth daily as needed for moderate constipation.  . Marland KitchenLORIC 40 MG tablet TAKE 1 TABLET (40 MG TOTAL) BY MOUTH DAILY. ALLOPURINOL INTOLERANT  . vitamin B-12 (CYANOCOBALAMIN) 500 MCG tablet Take 500 mcg by mouth daily.  . Marland Kitchenlbuterol (PROVENTIL) (2.5 MG/3ML) 0.083% nebulizer solution USE 1 VIAL PER NEBULIZER EVERY 6 HRS AS NEEDED FOR WHEEZING (Patient not taking: Reported on 02/24/2015)  . baclofen (LIORESAL) 10 MG tablet Take 10 mg by mouth 2 (two) times daily. Reported on 06/18/2015  . glucose blood test strip 1 each by Other route as needed for other. Use as instructed to check sugar once daily and as needed. **ONE TOUCH ULTRA** Dx: E11.9 (Patient not taking: Reported on 06/18/2015)   No current facility-administered medications on file prior  to visit.    Review of Systems Per HPI unless specifically indicated in ROS section     Objective:    BP 138/86 mmHg  Pulse 84  Temp(Src) 98 F (36.7 C) (Oral)  Wt 306 lb (138.801 kg)  Wt Readings from Last 3 Encounters:  06/18/15 306 lb (138.801 kg)  05/03/15 307 lb (139.254 kg)  04/21/15 309 lb (140.161 kg)    Physical Exam  Constitutional: He appears well-developed and well-nourished. No distress.  HENT:  Head: Normocephalic and atraumatic.  Right Ear: External ear normal.  Left Ear: External ear normal.  Nose: Nose normal.    Mouth/Throat: Oropharynx is clear and moist. No oropharyngeal exudate.  Eyes: Conjunctivae and EOM are normal. Pupils are equal, round, and reactive to light. No scleral icterus.  Neck: Normal range of motion. Neck supple.  Cardiovascular: Normal rate, regular rhythm, normal heart sounds and intact distal pulses.   No murmur heard. Pulmonary/Chest: Effort normal and breath sounds normal. No respiratory distress. He has no wheezes. He has no rales.  Musculoskeletal: He exhibits no edema.  See HPI for foot exam if done  Lymphadenopathy:    He has no cervical adenopathy.  Skin: Skin is warm and dry. No rash noted.  Psychiatric: He has a normal mood and affect.  Nursing note and vitals reviewed.  Results for orders placed or performed in visit on 04/09/15  Creatinine, serum  Result Value Ref Range   Creat 0.80 0.70 - 1.25 mg/dL   Lab Results  Component Value Date   HGBA1C 6.2* 12/31/2014       Assessment & Plan:   Problem List Items Addressed This Visit    Vitamin D deficiency   HLD (hyperlipidemia)    Check FLP today.      Relevant Orders   Comprehensive metabolic panel   Lipid panel   Ex-smoker    Congratulated on smoking cessation.       Diabetes type 2, controlled ([place])    Check labs today.  Foot exam today. rec schedule eye exam and foot exam with podiatrist.       Relevant Orders   Comprehensive metabolic panel   Hemoglobin A1c   Chronic pain - Primary    Reviewed recs from Dr Sanjuan Dame at [facility] pain clinic. Will request office note. Rx refilled for fentanyl/oxycodone today. Cancelled fentanyl he had at pharmacy (had not picked up yet). Update UDS today.       Relevant Medications   fentaNYL (DURAGESIC - DOSED MCG/HR) 50 MCG/HR   oxyCODONE (ROXICODONE) 15 MG immediate release tablet   CHARCOT-MARIE-TOOTH DISEASE    Exam benign today. Will continue to monitor      AAA (abdominal aortic aneurysm) [facility])    Discussed upcoming surgery          Follow up  plan: Return in about 3 months (around 09/16/2015), or as needed, for medicare wellness visit.

## 2015-06-20 ENCOUNTER — Other Ambulatory Visit: Payer: Self-pay | Admitting: Family Medicine

## 2015-06-20 DIAGNOSIS — E8809 Other disorders of plasma-protein metabolism, not elsewhere classified: Secondary | ICD-10-CM

## 2015-06-20 DIAGNOSIS — E871 Hypo-osmolality and hyponatremia: Secondary | ICD-10-CM

## 2015-06-20 DIAGNOSIS — R779 Abnormality of plasma protein, unspecified: Secondary | ICD-10-CM

## 2015-06-20 MED ORDER — VITAMIN D3 1.25 MG (50000 UT) PO TABS: 1.0000 | ORAL_TABLET | ORAL | Status: DC

## 2015-06-22 ENCOUNTER — Telehealth: Payer: Self-pay | Admitting: Family Medicine

## 2015-06-22 NOTE — Telephone Encounter (Signed)
Pt got letter about labs and would like to know what cholesterol is  Please call [phone] 1st.  If no answer, please call 2nd nubmer is [phone] Thank you

## 2015-06-23 ENCOUNTER — Encounter (HOSPITAL_COMMUNITY): Payer: Self-pay

## 2015-06-23 ENCOUNTER — Encounter (HOSPITAL_COMMUNITY)
Admission: RE | Admit: 2015-06-23 | Discharge: 2015-06-23 | Disposition: A | Discharge status: Home / Self Care | Payer: Medicare PPO | Source: Ambulatory Visit | Attending: Surgery | Admitting: Surgery

## 2015-06-23 DIAGNOSIS — Z7984 Long term (current) use of oral hypoglycemic drugs: Secondary | ICD-10-CM | POA: Diagnosis not present

## 2015-06-23 DIAGNOSIS — Z87891 Personal history of nicotine dependence: Secondary | ICD-10-CM | POA: Insufficient documentation

## 2015-06-23 DIAGNOSIS — E119 Type 2 diabetes mellitus without complications: Secondary | ICD-10-CM | POA: Diagnosis not present

## 2015-06-23 DIAGNOSIS — J449 Chronic obstructive pulmonary disease, unspecified: Secondary | ICD-10-CM | POA: Diagnosis not present

## 2015-06-23 DIAGNOSIS — I10 Essential (primary) hypertension: Secondary | ICD-10-CM | POA: Diagnosis not present

## 2015-06-23 DIAGNOSIS — Z79899 Other long term (current) drug therapy: Secondary | ICD-10-CM | POA: Insufficient documentation

## 2015-06-23 DIAGNOSIS — Z01812 Encounter for preprocedural laboratory examination: Secondary | ICD-10-CM | POA: Insufficient documentation

## 2015-06-23 DIAGNOSIS — Z7982 Long term (current) use of aspirin: Secondary | ICD-10-CM | POA: Diagnosis not present

## 2015-06-23 DIAGNOSIS — Z01818 Encounter for other preprocedural examination: Secondary | ICD-10-CM | POA: Diagnosis not present

## 2015-06-23 DIAGNOSIS — Z0183 Encounter for blood typing: Secondary | ICD-10-CM | POA: Diagnosis not present

## 2015-06-23 DIAGNOSIS — I714 Abdominal aortic aneurysm, without rupture: Secondary | ICD-10-CM | POA: Diagnosis not present

## 2015-06-23 DIAGNOSIS — I255 Ischemic cardiomyopathy: Secondary | ICD-10-CM | POA: Insufficient documentation

## 2015-06-23 DIAGNOSIS — K7581 Nonalcoholic steatohepatitis (NASH): Secondary | ICD-10-CM | POA: Insufficient documentation

## 2015-06-23 DIAGNOSIS — E785 Hyperlipidemia, unspecified: Secondary | ICD-10-CM | POA: Diagnosis not present

## 2015-06-23 DIAGNOSIS — Z955 Presence of coronary angioplasty implant and graft: Secondary | ICD-10-CM | POA: Diagnosis not present

## 2015-06-23 DIAGNOSIS — I252 Old myocardial infarction: Secondary | ICD-10-CM | POA: Insufficient documentation

## 2015-06-23 DIAGNOSIS — I2510 Atherosclerotic heart disease of native coronary artery without angina pectoris: Secondary | ICD-10-CM | POA: Diagnosis not present

## 2015-06-23 HISTORY — DX: Headache, unspecified: R51.9

## 2015-06-23 HISTORY — DX: Headache: R51

## 2015-06-23 LAB — SURGICAL PCR SCREEN
MRSA, PCR: POSITIVE — AB
Staphylococcus aureus: POSITIVE — AB

## 2015-06-23 LAB — BLOOD GAS, ARTERIAL
Acid-Base Excess: 3.7 mmol/L — ABNORMAL HIGH (ref 0.0–2.0)
BICARBONATE: 28 meq/L — AB (ref 20.0–24.0)
DRAWN BY: 206361
FIO2: 0.21
O2 Saturation: 91.7 %
PCO2 ART: 45.4 mmHg — AB (ref 35.0–45.0)
PH ART: 7.407 (ref 7.350–7.450)
Patient temperature: 98.6
TCO2: 29.4 mmol/L (ref 0–100)
pO2, Arterial: 64.6 mmHg — ABNORMAL LOW (ref 80.0–100.0)

## 2015-06-23 LAB — COMPREHENSIVE METABOLIC PANEL
ALK PHOS: 117 U/L (ref 38–126)
ALT: 24 U/L (ref 17–63)
ANION GAP: 5 (ref 5–15)
AST: 35 U/L (ref 15–41)
Albumin: 2.5 g/dL — ABNORMAL LOW (ref 3.5–5.0)
BUN: 10 mg/dL (ref 6–20)
CALCIUM: 8.5 mg/dL — AB (ref 8.9–10.3)
CHLORIDE: 106 mmol/L (ref 101–111)
CO2: 26 mmol/L (ref 22–32)
Creatinine, Ser: 0.88 mg/dL (ref 0.61–1.24)
GFR calc non Af Amer: >60 mL/min (ref >60)
GLUCOSE: 106 mg/dL — AB (ref 65–99)
POTASSIUM: 3.7 mmol/L (ref 3.5–5.1)
Sodium: 137 mmol/L (ref 135–145)
Total Bilirubin: 0.6 mg/dL (ref 0.3–1.2)
Total Protein: 7.7 g/dL (ref 6.5–8.1)

## 2015-06-23 LAB — APTT: aPTT: 30 seconds (ref 24–37)

## 2015-06-23 LAB — CBC
HEMATOCRIT: 35.4 % — AB (ref 39.0–52.0)
Hemoglobin: 11.8 g/dL — ABNORMAL LOW (ref 13.0–17.0)
MCH: 34.5 pg — ABNORMAL HIGH (ref 26.0–34.0)
MCHC: 33.3 g/dL (ref 30.0–36.0)
MCV: 103.5 fL — AB (ref 78.0–100.0)
Platelets: 77 10*3/uL — ABNORMAL LOW (ref 150–400)
RBC: 3.42 MIL/uL — ABNORMAL LOW (ref 4.22–5.81)
RDW: 14.6 % (ref 11.5–15.5)
WBC: 5.2 10*3/uL (ref 4.0–10.5)

## 2015-06-23 LAB — GLUCOSE, CAPILLARY: GLUCOSE-CAPILLARY: 116 mg/dL — AB (ref 65–99)

## 2015-06-23 LAB — TYPE AND SCREEN
ABO/RH(D): O POS
ANTIBODY SCREEN: NEGATIVE

## 2015-06-23 LAB — PROTIME-INR
INR: 1.2 (ref 0.00–1.49)
Prothrombin Time: 15.4 seconds — ABNORMAL HIGH (ref 11.6–15.2)

## 2015-06-23 NOTE — Progress Notes (Signed)
   06/23/15 1124  OBSTRUCTIVE SLEEP APNEA  Have you ever been diagnosed with sleep apnea through a sleep study? No  Do you snore loudly (loud enough to be heard through closed doors)?  0  Do you often feel tired, fatigued, or sleepy during the daytime (such as falling asleep during driving or talking to someone)? 1  Has anyone observed you stop breathing during your sleep? 0  Do you have, or are you being treated for high blood pressure? 1  BMI more than 35 kg/m2? 1  Age > 50 (1-yes) 1  Neck circumference greater than:Male 16 inches or larger, Male 17inches or larger? 1  Male Gender (Yes=1) 1  Obstructive Sleep Apnea Score 6  Score 5 or greater  Results sent to PCP

## 2015-06-23 NOTE — Progress Notes (Addendum)
PCP is Dr Ria Bush Cardiologist is Dr. Stanford Breed Reports that he doesn't know what his CBG's run states his wife usually checks it.  States he has multi open sores near his scrotum that has drainage from time to time. Echo and card cath noted from 2006 Stress test 2014 Denies a recent CXR or EKG

## 2015-06-23 NOTE — Pre-Procedure Instructions (Signed)
[name]  06/23/2015      [id number] [place] [facility] [address] [place] [address]Phone: [phone]: [phone] [facility] [address]Phone: [phone]: [phone] [place] [address] [place]Phone: [phone]: [phone]   Your procedure is scheduled on  Jan 27.  Report to [place] [phone].  Call this number if you have problems the morning of surgery:  [phone]   Remember:  Do not eat food or drink liquids after midnight.  Take these medicines the morning of surgery with A SIP OF WATER: Duloxetine (Cymbalta), Metoprolol Succinate (Toprol-XL),baclofen (Lioresal),  Flonase spray if needed, oxycodone (Roxicodone) if needed, Nitroglycerin (Notrostat) if needed, Uloric   Stop/ take aspirin as directed by your Dr  Stop taking BC's, Goody's, Herbal medication, Fish oil, Aleve, Ibuprofen  How to Manage Your Diabetes Before Surgery   Why is it important to control my blood sugar before and after surgery?   Improving blood sugar levels before and after surgery helps healing and can limit problems.  A way of improving blood sugar control is eating a healthy diet by:  - Eating less sugar and carbohydrates  - Increasing activity/exercise  - Talk with your doctor about reaching your blood sugar goals  High blood sugars (greater than 180 mg/dL) can raise your risk of infections and slow down your recovery so you will need to focus on controlling your diabetes during the weeks before surgery.  Make sure that the doctor who takes care of your diabetes knows about your planned surgery including the date and location.  How do I manage my blood sugars before surgery?   Check your blood sugar at least 4  times a day, 2 days before surgery to make sure that they are not too high or low.   Check your blood sugar the morning of your surgery when you wake up and every 2               hours until you get to the Short-Stay unit.  If your blood sugar is less than 70 mg/dL, you will need to treat for low blood sugar by:  Treat a low blood sugar (less than 70 mg/dL) with 1/2 cup of clear juice (cranberry or apple), 4 glucose tablets, OR glucose gel.  Recheck blood sugar in 15 minutes after treatment (to make sure it is greater than 70 mg/dL).  If blood sugar is not greater than 70 mg/dL on re-check, call [phone] further instructions.   Report your blood sugar to the Short-Stay nurse when you get to Short-Stay.  References:  [facility] 2007 "How to Manage your Diabetes Before and After Surgery".  What do I do about my diabetes medications?   Do not take oral diabetes medicines (pills) the morning of surgery.     Do not take other diabetes injectables the day of surgery including Byetta, Victoza, Bydureon, and Trulicity.    If your CBG is greater than 220 mg/dL, you may take 1/2 of your sliding scale (correction) dose of insulin.    Do not wear jewelry, make-up or nail polish.  Do not wear lotions, powders, or perfumes.  You may wear deodorant.  Do not shave 48  hours prior to surgery.  Men may shave face and neck.  Do not bring valuables to the hospital.  [facility] is not responsible for any belongings or valuables.  Contacts, dentures or bridgework may not be worn into surgery.  Leave your suitcase in the car.  After surgery it may be brought to your room.  For patients admitted to the hospital, discharge time will be determined by your treatment team.  Patients discharged the day of surgery will not be allowed to drive home.   Special instructions:  [facility] - Preparing for Surgery  Before surgery, you can play an important role.  Because  skin is not sterile, your skin needs to be as free of germs as possible.  You can reduce the number of germs on you skin by washing with CHG (chlorahexidine gluconate) soap before surgery.  CHG is an antiseptic cleaner which kills germs and bonds with the skin to continue killing germs even after washing.  Please DO NOT use if you have an allergy to CHG or antibacterial soaps.  If your skin becomes reddened/irritated stop using the CHG and inform your nurse when you arrive at Short Stay.  Do not shave (including legs and underarms) for at least 48 hours prior to the first CHG shower.  You may shave your face.  Please follow these instructions carefully:   1.  Shower with CHG Soap the night before surgery and the  morning of Surgery.  2.  If you choose to wash your hair, wash your hair first as usual with your normal shampoo.  3.  After you shampoo, rinse your hair and body thoroughly to remove the  Shampoo.  4.  Use CHG as you would any other liquid soap.  You can apply chg directly to the skin and wash gently with scrungie or a clean washcloth.  5.  Apply the CHG Soap to your body ONLY FROM THE NECK DOWN.    Do not use on open wounds or open sores.  Avoid contact with your eyes,   ears, mouth and genitals (private parts).  Wash genitals (private parts)  with your normal soap.  6.  Wash thoroughly, paying special attention to the area where your surgery   will be performed.  7.  Thoroughly rinse your body with warm water from the neck down.  8.  DO NOT shower/wash with your normal soap after using and rinsing off  the CHG Soap.  9.  Pat yourself dry with a clean towel.            10.  Wear clean pajamas.            11.  Place clean sheets on your bed the night of your first shower and do not sleep with pets.  Day of Surgery  Do not apply any lotions/deoderants the morning of surgery.  Please wear clean clothes to the hospital/surgery center.     Please read over the following fact sheets  that you were given. Pain Booklet, Coughing and Deep Breathing, Blood Transfusion Information, MRSA Information and Surgical Site Infection Prevention

## 2015-06-23 NOTE — Progress Notes (Signed)
States he is not able to obtain a urine specimen at this time, instructed to bring his first void the day of surgery. Voices understanding.

## 2015-06-23 NOTE — Telephone Encounter (Signed)
Spoke with patient patient and notified him of cholesterol readings.

## 2015-06-23 NOTE — Progress Notes (Signed)
Positive results from pcr screen for mrsa and mssa- called pt wife (per his request) and informed her of results and the need to pick up the script for Mupirocin. Script called into [facility]

## 2015-06-24 ENCOUNTER — Other Ambulatory Visit: Payer: Medicare PPO

## 2015-06-24 ENCOUNTER — Encounter (HOSPITAL_COMMUNITY): Payer: Self-pay | Admitting: Emergency Medicine

## 2015-06-24 LAB — HEMOGLOBIN A1C
Hgb A1c MFr Bld: 5.8 % — ABNORMAL HIGH (ref 4.8–5.6)
Mean Plasma Glucose: 120 mg/dL

## 2015-06-24 NOTE — Progress Notes (Addendum)
Anesthesia Chart Review:  Pt is 60 year old male scheduled for abdominal aortic endovascular fenestrated stent graft on 07/02/2015 with Dr. Trula Slade.   Cardiologist is Dr. Kirk Ruths. PCP is Dr. Ria Bush.   PMH includes:  CAD (3 stents), MI, ischemic cardiomyopathy, HTN, DM, hyperlipidemia, cirrhosis due to NASH, hepatitis B, COPD, AAA. Former smoker. BMI 49.  S/p wide excision hidradenitis 12/31/14.   Medications include: albuterol, ASA, fentanyl, irbesartan, metformin, metoprolol  Preoperative labs reviewed.  Platelets 77. Glucose 106, hgbA1c 5.8. Notified Kay in Dr. Stephens Shire office of low platelets.   Carotid duplex US 09/23/14: <40% B ICA stenosis  EKG 10/12/14: sinus rhythm. T abnormality in high lateral leads  Nuclear stress test 10/23/12: Moderate scar in the inferior wall Otherwise normal perfusion.LV Ejection Fraction: 39%. LV Wall Motion: Inferior/inferolateral hypokinesis.  Cardiac cath 03/30/05:  1. LV 111/16/20. EF 40% with inferior akinesis. 2. No aortic stenosis or mitral regurgitation. 3. Left main: Angiographically normal. 4. LAD: Moderate sized vessel giving rise to one large diagonal. There is a 30% stenosis of the proximal vessel. 5. Circumflex: Moderate sized vessel giving rise to two obtuse marginals. It is angiographically normal. 6. RCA: Moderate sized dominant vessel. The stents in the proximal and mid vessel as well as distal vessel and PLV are widely patent. The jailed PDA that had been treated with balloon angioplasty remains widely patent as well.  Pt was originally scheduled for this surgery back in July but was found to have hidradenitis suppurativa with drainage in his groin. Pt had cardiac clearance for original surgery from Dr. Stanford Breed in [place] note dated 09/01/14. Dr. Danise Mina is aware of upcoming surgery.   Reviewed case with Dr. Ola Spurr. Defer addressing low platelets to Dr. Trula Slade.   If no changes, I anticipate pt can  proceed with surgery as scheduled.   Willeen Cass, FNP-BC [facility] Short Stay Surgical Center/Anesthesiology Phone: [phone] 06/24/2015 12:05 PM  Addendum: Collet[facility] [place] called. Labs reviewed with covering vascular surgeon Dr. Oneida Alar. No additional orders recommended.   George Hugh [facility] Short Stay Center/Anesthesiology Phone [phone] 06/24/2015 5:11 PM

## 2015-06-30 ENCOUNTER — Telehealth: Payer: Self-pay

## 2015-06-30 ENCOUNTER — Encounter: Payer: Self-pay | Admitting: Vascular Surgery

## 2015-06-30 ENCOUNTER — Telehealth: Payer: Self-pay | Admitting: Vascular Surgery

## 2015-06-30 ENCOUNTER — Encounter: Payer: Self-pay | Admitting: Family Medicine

## 2015-06-30 DIAGNOSIS — L732 Hidradenitis suppurativa: Secondary | ICD-10-CM | POA: Diagnosis not present

## 2015-06-30 NOTE — Telephone Encounter (Signed)
Pt. called after seeing the PA @ general surgeon's office.  Was advised against having the FEVAR this week, on 1/27.  Reported he was placed on abx, and advised to f/u with General surgeon in 2 weeks.  Will cancel FEVAR 1/27, and make Dr. Oneida Alar aware.

## 2015-06-30 NOTE — Telephone Encounter (Signed)
Pt. called with report of new boil in right groin that has drained blood and pus.  Denied being on antibiotic recently.  Has been using topical Clindamycin.  Advised pt. to go to the general surgeon that excised the Hidradenitis last July.  Agreed.

## 2015-06-30 NOTE — Telephone Encounter (Signed)
-----   Message from Denman George, RN sent at 06/30/2015  4:33 PM EST ----- Regarding: cancel appt. with Dr. Oneida Alar Please cancel  appt. with Dr. Oneida Alar tomorrow, (1/26).  The pt's surgery has been cancelled for 1/27, so doesn't need to come to office tomorrow.  The pt. knows the appt. will be cancelled. Thanks.

## 2015-07-01 ENCOUNTER — Ambulatory Visit: Payer: Medicare PPO | Admitting: Vascular Surgery

## 2015-07-02 ENCOUNTER — Inpatient Hospital Stay (HOSPITAL_COMMUNITY): Admission: RE | Admit: 2015-07-02 | Payer: Medicare PPO | Source: Ambulatory Visit | Admitting: Surgery

## 2015-07-02 ENCOUNTER — Encounter (HOSPITAL_COMMUNITY): Admission: RE | Payer: Self-pay | Source: Ambulatory Visit

## 2015-07-02 SURGERY — ABDOMINAL AORTIC ENDOVASCULAR FENESTRATED STENT GRAFT
Anesthesia: General

## 2015-07-12 ENCOUNTER — Other Ambulatory Visit: Payer: Self-pay | Admitting: Family Medicine

## 2015-07-14 ENCOUNTER — Ambulatory Visit: Payer: Medicare PPO

## 2015-07-16 DIAGNOSIS — L732 Hidradenitis suppurativa: Secondary | ICD-10-CM | POA: Diagnosis not present

## 2015-07-19 ENCOUNTER — Other Ambulatory Visit: Payer: Self-pay

## 2015-07-19 ENCOUNTER — Ambulatory Visit (INDEPENDENT_AMBULATORY_CARE_PROVIDER_SITE_OTHER): Payer: Medicare PPO

## 2015-07-19 ENCOUNTER — Telehealth: Payer: Self-pay | Admitting: Family Medicine

## 2015-07-19 VITALS — BP 128/78 | HR 70 | Temp 98.6°F | Ht 68.0 in | Wt 313.0 lb

## 2015-07-19 DIAGNOSIS — Z0000 Encounter for general adult medical examination without abnormal findings: Secondary | ICD-10-CM | POA: Diagnosis not present

## 2015-07-19 MED ORDER — OXYCODONE HCL 15 MG PO TABS: 15.0000 mg | ORAL_TABLET | ORAL | Status: DC | PRN

## 2015-07-19 MED ORDER — ALBUTEROL SULFATE (2.5 MG/3ML) 0.083% IN NEBU: INHALATION_SOLUTION | RESPIRATORY_TRACT | Status: DC

## 2015-07-19 MED ORDER — FENTANYL 50 MCG/HR TD PT72: 50.0000 ug | MEDICATED_PATCH | TRANSDERMAL | Status: DC

## 2015-07-19 MED ORDER — ASPIRIN 325 MG PO TABS: 325.0000 mg | ORAL_TABLET | Freq: Every day | ORAL | Status: DC

## 2015-07-19 NOTE — Progress Notes (Signed)
Subjective:   [name] is a 60 y.o. male who presents for Medicare Annual/Subsequent preventive examination.  Review of Systems:  No ROS.        Objective:    Vitals: BP 128/78 mmHg  Pulse 70  Temp(Src) 98.6 F (37 C) (Oral)  Ht 5' 8"  (1.727 m)  Wt 313 lb (141.976 kg)  BMI 47.60 kg/m2  SpO2 96%  Tobacco History  Smoking status  . Former Smoker -- 0.75 packs/day for 57 years  . Types: Cigarettes  . Start date: 06/06/1967  . Quit date: 05/06/2015  Smokeless tobacco  . Never Used    Comment: stopped smoking a pipe in 2015     Counseling given: Not Answered   Past Medical History  Diagnosis Date  . Ischemic cardiomyopathy     s/p inferior MI  --  current ef per myoview 39%  . T2DM (type 2 diabetes mellitus) ([place])     ABIs WNL 2016  . Disturbances of sensation of smell and taste   . Gout   . Hidradenitis suppurativa dx 2011   goin and leg crease    followd by Lyndle Herrlich - daily bactrim, s/p intralesional steroid injection 10/2010  . HLD (hyperlipidemia)   . HTN (hypertension)   . Obesity   . Vitamin D deficiency   . Allergic rhinitis   . Lumbar herniated disc   . Spinal stenosis     released from [facility].  established with preferred pain (07/2013)  . History of hepatitis B 1983  . History of viral meningitis 2000  . Chronic pain syndrome     established with [facility] ([place]) --> disagreement and transfered care to Dr Sanjuan Dame at [facility] clinic [facility], requests PCP write Rx but f/u with pain clinic Q6-12 months  . Liver cirrhosis secondary to NASH 01/2014    by CT scan, rec virtual colonoscopy by Dr Collene Mares 06/2014  . Nocturia more than twice per night   . CAD (coronary artery disease) cardiologist-  dr Stanford Breed    x3 with stents last 2005, EF 40%, predominantly RCA by CT 2016  . History of MI (myocardial infarction)     2000  &  2005  . AAA (abdominal aortic aneurysm) ([place]) 09/2012--  monitored by dr Trula Slade    stable 5.6cm CTA abdomen  2016  . COPD (chronic obstructive pulmonary disease) ([place]) 10/2011    minimal by PFTs  . Dyspnea on exertion   . Cervical spondylosis 05/2010    s/p surgery  . DDD (degenerative disc disease)   . Hip osteoarthritis     s/p intraarticular steroid shot (12/2012) ([place])  . B12 deficiency   . Hidradenitis     right groin  . Myocardial infarction ([place])   . GERD (gastroesophageal reflux disease)   . Hepatitis     hepatitis B  . Charcot Marie Tooth muscular atrophy dx  [phone]    neurologist--  dr love--  type 2 per pt  . Headache    Past Surgical History  Procedure Laterality Date  . Tonsillectomy and adenoidectomy  1972  . Lumbar disc surgery      L5-S1  . Lumbar laminectomy  05-18-2010    L2--5   laminectomy/foraminotomy for stenosis [facility])  . Myelogram      L5-S1 and L1-2 spondylosis  . Colonoscopy  05/06/2007    normal, small int hemorrhoids rpt 5 yrs due to fmhx - rec against rpt colonoscopy by Dr Collene Mares  . Sacroiliac  joint injection Bilateral 10/2013    [place]  . Coronary angioplasty  2000  dr Stanford Breed    PCI to RCA and PDA  . Coronary angioplasty with stent placement  03-19-2005  dr Gwyndolyn Saxon [place]    inferior STEMI--- DES x4 to RCA w/ balloon angioplasty and balloon angioplasty to jailed PDA ostium/  severe hypokinesis of midinferor wall, ef 50%/  dLM 20%,  mLAD 20%,  dCFX 60%  . Cardiac catheterization  03-30-2005  dr Albertine Patricia    ef 40% w/ inferior akinesis/  LM and CFX angiographically normal/  pLAD 30%/   Widely patent stents in RCA and PDA widely patent  . Anterior cervical decomp/discectomy fusion  01-07-2010     C4 -- C7  . Cardiovascular stress test  10-23-2012  dr Stanford Breed    No ischemia/  Moderate scar in the inferior wall, otherwise normal perfusion/  LV ef 39%,  LV wall motion: inferior/ inferolateral hypokinesis  . Hydradenitis excision Right 12/31/2014    Procedure: WIDE EXCISION HIDRADENITIS GROIN; Coralie Keens, MD   Family History  Problem Relation  Age of Onset  . Cancer Mother     colon  . Diabetes Mother   . Kidney disease Mother   . Aneurysm Mother     AAA  . Rheum arthritis Mother   . Charcot-Marie-Tooth disease Mother   . Heart disease Mother     before age 2  . Cancer Father     skin  . Heart attack Father   . Heart disease Father     before age 28  . Cancer Brother     skin  . Coronary artery disease Brother   . Cancer Brother     small cell lung cancer  . Aneurysm Brother     AAA  . Rheum arthritis Sister   . Rheum arthritis Brother    History  Sexual Activity  . Sexual Activity: Not on file    Outpatient Encounter Prescriptions as of 07/19/2015  Medication Sig  . albuterol (PROVENTIL) (2.5 MG/3ML) 0.083% nebulizer solution USE 1 VIAL PER NEBULIZER EVERY 6 HRS AS NEEDED FOR WHEEZING  . aspirin 325 MG tablet Take 325 mg by mouth daily.    . baclofen (LIORESAL) 10 MG tablet Take 10 mg by mouth 2 (two) times daily. Reported on 06/18/2015  . cholecalciferol (VITAMIN D) 1000 units tablet Take 1,000 Units by mouth daily.  . DULoxetine (CYMBALTA) 60 MG capsule TAKE ONE CAPSULE BY MOUTH DAILY  . fentaNYL (DURAGESIC - DOSED MCG/HR) 50 MCG/HR Place 1 patch (50 mcg total) onto the skin every other day.  . fluticasone (FLONASE) 50 MCG/ACT nasal spray Place 2 sprays into both nostrils daily as needed for allergies.   . folic acid (FOLVITE) 1 MG tablet TAKE 1 TABLET BY MOUTH ONCE A DAY  . irbesartan (AVAPRO) 75 MG tablet Take 0.5 tablets (37.5 mg total) by mouth daily.  . metFORMIN (GLUCOPHAGE) 500 MG tablet TAKE 1/2 TABLET BY MOUTH EVERY NIGHT AT BEDTIME.  . metoprolol succinate (TOPROL-XL) 25 MG 24 hr tablet TAKE 1/2 TABLET EVERY DAY  . nitroGLYCERIN (NITROSTAT) 0.4 MG SL tablet Place 1 tablet (0.4 mg total) under the tongue every 5 (five) minutes as needed for chest pain.  [name] oxyCODONE (ROXICODONE) 15 MG immediate release tablet Take 1 tablet (15 mg total) by mouth every 4 (four) hours as needed.  . polyethylene glycol  powder (GLYCOLAX/MIRALAX) powder Take 17 g by mouth daily as needed for moderate constipation.  [name] Melburn Popper  40 MG tablet TAKE 1 TABLET (40 MG TOTAL) BY MOUTH DAILY. ALLOPURINOL INTOLERANT  . vitamin B-12 (CYANOCOBALAMIN) 500 MCG tablet Take 500 mcg by mouth daily.  . Cholecalciferol (VITAMIN D3) 50000 units TABS Take 1 tablet by mouth once a week. (Patient not taking: Reported on 06/23/2015)  . clindamycin (CLEOCIN-T) 1 % external solution Apply topically 2 (two) times daily. (Patient not taking: Reported on 06/23/2015)  . glucose blood test strip 1 each by Other route as needed for other. Use as instructed to check sugar once daily and as needed. **ONE TOUCH ULTRA** Dx: E11.9 (Patient not taking: Reported on 06/18/2015)  . [DISCONTINUED] fentaNYL (DURAGESIC - DOSED MCG/HR) 50 MCG/HR Place 1 patch (50 mcg total) onto the skin every other day.  . [DISCONTINUED] oxyCODONE (ROXICODONE) 15 MG immediate release tablet Take 1 tablet (15 mg total) by mouth every 4 (four) hours as needed.   No facility-administered encounter medications on file as of 07/19/2015.    Activities of Daily Living In your present state of health, do you have any difficulty performing the following activities: 07/19/2015 06/23/2015  Hearing? N N  Vision? Y - patient reported N  Difficulty concentrating or making decisions? N N  Walking or climbing stairs? Y - ambulates with cane Y  Dressing or bathing? Y - spouse assists with dressing and bathing N  Doing errands, shopping? N N    Patient Care Team: Ria Bush, MD as PCP - General (Family Medicine) Lennon Alstrom, MD as Consulting Physician (Neurology) Lelon Perla, MD as Consulting Physician (Cardiology) Druscilla Brownie, MD as Consulting Physician (Dermatology)   Assessment:     Exercise Activities and Dietary recommendations    Goals    Goal were not established at this time as patient is getting ready to have an EVAR.      Fall Risk Fall Risk  07/19/2015  04/21/2014 06/07/2012  Falls in the past year? Yes Yes Yes  Number falls in past yr: 1 - last fall was 07/16/15. Loss of consciousness with head injury. Patient did not seek medical treatment.  2 or more 2 or more  Injury with Fall? Yes - -  Risk Factor Category  High Fall Risk High Fall Risk -  Risk for fall due to : History of fall(s);Impaired balance/gait;Impaired mobility Impaired balance/gait;Impaired mobility Impaired balance/gait;Impaired mobility  Follow up Falls evaluation completed;Education provided;Falls prevention discussed - -   Depression Screen PHQ 2/9 Scores 07/19/2015 04/21/2014 06/07/2012  PHQ - 2 Score 0 0 1    Cognitive Testing MMSE - Mini Mental State Exam 07/19/2015  Orientation to time 5  Orientation to Place 5  Registration 3  Attention/ Calculation 5  Recall 3  Language- repeat 1  Language- read & follow direction 1    Immunization History  Administered Date(s) Administered  . Hepatitis B 06/05/1988, 08/03/1988, 11/03/1988  . Influenza Split 05/11/2011, 04/23/2012  . Influenza Whole 06/05/2005, 06/05/2008, 05/03/2010  . Influenza,inj,Quad PF,36+ Mos 03/13/2013, 04/21/2014, 04/05/2015  . Pneumococcal Polysaccharide-23 06/05/2005  . Td 06/05/2005   Screening Tests Health Maintenance  Topic Date Due  . HIV Screening  03/04/1970 (postponed)  . PNEUMOCOCCAL POLYSACCHARIDE VACCINE (2) 06/05/2010 (postponed)  . OPHTHALMOLOGY EXAM  10/25/2011 (postponed)  . ZOSTAVAX  03/05/2015  . TETANUS/TDAP  06/06/2015  . HEMOGLOBIN A1C  12/21/2015  . INFLUENZA VACCINE  01/04/2016  . FOOT EXAM  06/17/2016  . COLONOSCOPY  08/01/2019  . DTaP/Tdap/Td  Completed  . Hepatitis C Screening  Completed  Plan:    During the course of the visit the patient was educated and counseled about the following appropriate screening and preventive services:   Cardiovascular Disease - encouraged patient to discuss with surgeon if he would be candidate for cardiac rehab  Nutrition  counseling - patient plans to consult with a RD after surgery   Patient Instructions (the written plan) was given to the patient.   Please see attached scanned questionnaire for additional information.   Lindell Noe, LPN  [id number]

## 2015-07-19 NOTE — Telephone Encounter (Signed)
Controlled substances already given to patient. Other meds refilled.

## 2015-07-19 NOTE — Telephone Encounter (Signed)
Pt here for AMW with [facility]. Requests Rx controlled substances. Printed and in [facility]' box.

## 2015-07-19 NOTE — Progress Notes (Signed)
Pre visit review using our clinic review tool, if applicable. No additional management support is needed unless otherwise documented below in the visit note. 

## 2015-07-19 NOTE — Telephone Encounter (Signed)
Rx's given to pt's wife per [place].

## 2015-07-19 NOTE — Patient Instructions (Addendum)
[name] , Thank you for taking time to come for your Medicare Wellness Visit. I appreciate your ongoing commitment to your health goals. Please review the following plan we discussed and let me know if I can assist you in the future.     This is a list of the screening recommended for you and due dates:  Health Maintenance  Topic Date Due  . HIV Screening  03/04/1970  . Pneumococcal vaccine (2) 06/05/2010  . Eye exam for diabetics  10/25/2011  . Shingles Vaccine  03/05/2015  . Tetanus Vaccine  06/06/2015  . Hemoglobin A1C  12/21/2015  . Flu Shot  01/04/2016  . Complete foot exam   06/17/2016  . Colon Cancer Screening  08/01/2019  . DTaP/Tdap/Td vaccine  Completed  .  Hepatitis C: One time screening is recommended by Center for Disease Control  (CDC) for  adults born from [birth year] through 1966.   Completed    Fall Prevention in the Home  Falls can cause injuries. They can happen to people of all ages. There are many things you can do to make your home safe and to help prevent falls.  WHAT CAN I DO ON THE OUTSIDE OF MY HOME?  Regularly fix the edges of walkways and driveways and fix any cracks.  Remove anything that might make you trip as you walk through a door, such as a raised step or threshold.  Trim any bushes or trees on the path to your home.  Use bright outdoor lighting.  Clear any walking paths of anything that might make someone trip, such as rocks or tools.  Regularly check to see if handrails are loose or broken. Make sure that both sides of any steps have handrails.  Any raised decks and porches should have guardrails on the edges.  Have any leaves, snow, or ice cleared regularly.  Use sand or salt on walking paths during winter.  Clean up any spills in your garage right away. This includes oil or grease spills. WHAT CAN I DO IN THE BATHROOM?   Use night lights.  Install grab bars by the toilet and in the tub and shower. Do not use towel bars as grab  bars.  Use non-skid mats or decals in the tub or shower.  If you need to sit down in the shower, use a plastic, non-slip stool.  Keep the floor dry. Clean up any water that spills on the floor as soon as it happens.  Remove soap buildup in the tub or shower regularly.  Attach bath mats securely with double-sided non-slip rug tape.  Do not have throw rugs and other things on the floor that can make you trip. WHAT CAN I DO IN THE BEDROOM?  Use night lights.  Make sure that you have a light by your bed that is easy to reach.  Do not use any sheets or blankets that are too big for your bed. They should not hang down onto the floor.  Have a firm chair that has side arms. You can use this for support while you get dressed.  Do not have throw rugs and other things on the floor that can make you trip. WHAT CAN I DO IN THE KITCHEN?  Clean up any spills right away.  Avoid walking on wet floors.  Keep items that you use a lot in easy-to-reach places.  If you need to reach something above you, use a strong step stool that has a grab bar.  Keep electrical  cords out of the way.  Do not use floor polish or wax that makes floors slippery. If you must use wax, use non-skid floor wax.  Do not have throw rugs and other things on the floor that can make you trip. WHAT CAN I DO WITH MY STAIRS?  Do not leave any items on the stairs.  Make sure that there are handrails on both sides of the stairs and use them. Fix handrails that are broken or loose. Make sure that handrails are as long as the stairways.  Check any carpeting to make sure that it is firmly attached to the stairs. Fix any carpet that is loose or worn.  Avoid having throw rugs at the top or bottom of the stairs. If you do have throw rugs, attach them to the floor with carpet tape.  Make sure that you have a light switch at the top of the stairs and the bottom of the stairs. If you do not have them, ask someone to add them for  you. WHAT ELSE CAN I DO TO HELP PREVENT FALLS?  Wear shoes that:  Do not have high heels.  Have rubber bottoms.  Are comfortable and fit you well.  Are closed at the toe. Do not wear sandals.  If you use a stepladder:  Make sure that it is fully opened. Do not climb a closed stepladder.  Make sure that both sides of the stepladder are locked into place.  Ask someone to hold it for you, if possible.  Clearly mark and make sure that you can see:  Any grab bars or handrails.  First and last steps.  Where the edge of each step is.  Use tools that help you move around (mobility aids) if they are needed. These include:  Canes.  Walkers.  Scooters.  Crutches.  Turn on the lights when you go into a dark area. Replace any light bulbs as soon as they burn out.  Set up your furniture so you have a clear path. Avoid moving your furniture around.  If any of your floors are uneven, fix them.  If there are any pets around you, be aware of where they are.  Review your medicines with your doctor. Some medicines can make you feel dizzy. This can increase your chance of falling. Ask your doctor what other things that you can do to help prevent falls.   This information is not intended to replace advice given to you by your health care provider. Make sure you discuss any questions you have with your health care provider.   Document Released: 03/18/2009 Document Revised: 10/06/2014 Document Reviewed: 06/26/2014 Elsevier Interactive Patient Education 2016 [place] Maintenance, Male A healthy lifestyle and preventative care can promote health and wellness.  Maintain regular health, dental, and eye exams.  Eat a healthy diet. Foods like vegetables, fruits, whole grains, low-fat dairy products, and lean protein foods contain the nutrients you need and are low in calories. Decrease your intake of foods high in solid fats, added sugars, and salt. Get information about a  proper diet from your health care provider, if necessary.  Regular physical exercise is one of the most important things you can do for your health. Most adults should get at least 150 minutes of moderate-intensity exercise (any activity that increases your heart rate and causes you to sweat) each week. In addition, most adults need muscle-strengthening exercises on 2 or more days a week.   Maintain a healthy weight. The  body mass index (BMI) is a screening tool to identify possible weight problems. It provides an estimate of body fat based on height and weight. Your health care provider can find your BMI and can help you achieve or maintain a healthy weight. For males 20 years and older:  A BMI below 18.5 is considered underweight.  A BMI of 18.5 to 24.9 is normal.  A BMI of 25 to 29.9 is considered overweight.  A BMI of 30 and above is considered obese.  Maintain normal blood lipids and cholesterol by exercising and minimizing your intake of saturated fat. Eat a balanced diet with plenty of fruits and vegetables. Blood tests for lipids and cholesterol should begin at age 71 and be repeated every 5 years. If your lipid or cholesterol levels are high, you are over age 21, or you are at high risk for heart disease, you may need your cholesterol levels checked more frequently.Ongoing high lipid and cholesterol levels should be treated with medicines if diet and exercise are not working.  If you smoke, find out from your health care provider how to quit. If you do not use tobacco, do not start.  Lung cancer screening is recommended for adults aged 59-80 years who are at high risk for developing lung cancer because of a history of smoking. A yearly low-dose CT scan of the lungs is recommended for people who have at least a 30-pack-year history of smoking and are current smokers or have quit within the past 15 years. A pack year of smoking is smoking an average of 1 pack of cigarettes a day for 1 year  (for example, a 30-pack-year history of smoking could mean smoking 1 pack a day for 30 years or 2 packs a day for 15 years). Yearly screening should continue until the smoker has stopped smoking for at least 15 years. Yearly screening should be stopped for people who develop a health problem that would prevent them from having lung cancer treatment.  If you choose to drink alcohol, do not have more than 2 drinks per day. One drink is considered to be 12 oz (360 mL) of beer, 5 oz (150 mL) of wine, or 1.5 oz (45 mL) of liquor.  Avoid the use of street drugs. Do not share needles with anyone. Ask for help if you need support or instructions about stopping the use of drugs.  High blood pressure causes heart disease and increases the risk of stroke. High blood pressure is more likely to develop in:  People who have blood pressure in the end of the normal range (100-139/85-89 mm Hg).  People who are overweight or obese.  People who are African American.  If you are 38-107 years of age, have your blood pressure checked every 3-5 years. If you are 40 years of age or older, have your blood pressure checked every year. You should have your blood pressure measured twice--once when you are at a hospital or clinic, and once when you are not at a hospital or clinic. Record the average of the two measurements. To check your blood pressure when you are not at a hospital or clinic, you can use:  An automated blood pressure machine at a pharmacy.  A home blood pressure monitor.  If you are 102-45 years old, ask your health care provider if you should take aspirin to prevent heart disease.  Diabetes screening involves taking a blood sample to check your fasting blood sugar level. This should be done once every  3 years after age 16 if you are at a normal weight and without risk factors for diabetes. Testing should be considered at a younger age or be carried out more frequently if you are overweight and have at  least 1 risk factor for diabetes.  Colorectal cancer can be detected and often prevented. Most routine colorectal cancer screening begins at the age of 65 and continues through age 53. However, your health care provider may recommend screening at an earlier age if you have risk factors for colon cancer. On a yearly basis, your health care provider may provide home test kits to check for hidden blood in the stool. A small camera at the end of a tube may be used to directly examine the colon (sigmoidoscopy or colonoscopy) to detect the earliest forms of colorectal cancer. Talk to your health care provider about this at age 41 when routine screening begins. A direct exam of the colon should be repeated every 5-10 years through age 46, unless early forms of precancerous polyps or small growths are found.  People who are at an increased risk for hepatitis B should be screened for this virus. You are considered at high risk for hepatitis B if:  You were born in a country where hepatitis B occurs often. Talk with your health care provider about which countries are considered high risk.  Your parents were born in a high-risk country and you have not received a shot to protect against hepatitis B (hepatitis B vaccine).  You have HIV or AIDS.  You use needles to inject street drugs.  You live with, or have sex with, someone who has hepatitis B.  You are a man who has sex with other men (MSM).  You get hemodialysis treatment.  You take certain medicines for conditions like cancer, organ transplantation, and autoimmune conditions.  Hepatitis C blood testing is recommended for all people born from [birth year] through 1966 and any individual with known risk factors for hepatitis C.  Healthy men should no longer receive prostate-specific antigen (PSA) blood tests as part of routine cancer screening. Talk to your health care provider about prostate cancer screening.  Testicular cancer screening is not recommended  for adolescents or adult males who have no symptoms. Screening includes self-exam, a health care provider exam, and other screening tests. Consult with your health care provider about any symptoms you have or any concerns you have about testicular cancer.  Practice safe sex. Use condoms and avoid high-risk sexual practices to reduce the spread of sexually transmitted infections (STIs).  You should be screened for STIs, including gonorrhea and chlamydia if:  You are sexually active and are younger than 24 years.  You are older than 24 years, and your health care provider tells you that you are at risk for this type of infection.  Your sexual activity has changed since you were last screened, and you are at an increased risk for chlamydia or gonorrhea. Ask your health care provider if you are at risk.  If you are at risk of being infected with HIV, it is recommended that you take a prescription medicine daily to prevent HIV infection. This is called pre-exposure prophylaxis (PrEP). You are considered at risk if:  You are a man who has sex with other men (MSM).  You are a heterosexual man who is sexually active with multiple partners.  You take drugs by injection.  You are sexually active with a partner who has HIV.  Talk with your health care  provider about whether you are at high risk of being infected with HIV. If you choose to begin PrEP, you should first be tested for HIV. You should then be tested every 3 months for as long as you are taking PrEP.  Use sunscreen. Apply sunscreen liberally and repeatedly throughout the day. You should seek shade when your shadow is shorter than you. Protect yourself by wearing long sleeves, pants, a wide-brimmed hat, and sunglasses year round whenever you are outdoors.  Tell your health care provider of new moles or changes in moles, especially if there is a change in shape or color. Also, tell your health care provider if a mole is larger than the size  of a pencil eraser.  A one-time screening for abdominal aortic aneurysm (AAA) and surgical repair of large AAAs by ultrasound is recommended for men aged 1-75 years who are current or former smokers.  Stay current with your vaccines (immunizations).   This information is not intended to replace advice given to you by your health care provider. Make sure you discuss any questions you have with your health care provider.   Document Released: 11/18/2007 Document Revised: 06/12/2014 Document Reviewed: 10/17/2010 Elsevier Interactive Patient Education [employer].

## 2015-07-27 ENCOUNTER — Other Ambulatory Visit: Payer: Self-pay

## 2015-07-28 ENCOUNTER — Encounter: Payer: Self-pay | Admitting: Surgery

## 2015-08-02 ENCOUNTER — Ambulatory Visit (INDEPENDENT_AMBULATORY_CARE_PROVIDER_SITE_OTHER): Payer: Medicare PPO | Admitting: Surgery

## 2015-08-02 ENCOUNTER — Encounter: Payer: Self-pay | Admitting: Surgery

## 2015-08-02 VITALS — BP 105/75 | HR 67 | Temp 98.6°F | Resp 16 | Ht 68.0 in | Wt 313.0 lb

## 2015-08-02 DIAGNOSIS — I714 Abdominal aortic aneurysm, without rupture, unspecified: Secondary | ICD-10-CM

## 2015-08-02 NOTE — Progress Notes (Signed)
Patient name: [name] MRN: [id number] DOB: 08-12-1955 Sex: male     Chief Complaint  Patient presents with  . Re-evaluation    Pt here to see it he can have surgery for AAA  March 8th, 2017     HISTORY OF PRESENT ILLNESS:  the patient is here today for a wound check.  He has previously undergone resection of bilateral inguinal hidradenitis by Dr. Rush Farmer. We are awaiting his incisions to heal so that we can proceed with fenestrated endovascular aneurysm repair. He is doing everything he can at home to keep these areas dry. Unfortunately, there continues to be drainage from the right groin.  He does not have any fevers or chills.  Past Medical History  Diagnosis Date  . Ischemic cardiomyopathy     s/p inferior MI  --  current ef per myoview 39%  . T2DM (type 2 diabetes mellitus) ([place])     ABIs WNL 2016  . Disturbances of sensation of smell and taste   . Gout   . Hidradenitis suppurativa dx 2011   goin and leg crease    followd by Lyndle Herrlich - daily bactrim, s/p intralesional steroid injection 10/2010  . HLD (hyperlipidemia)   . HTN (hypertension)   . Obesity   . Vitamin D deficiency   . Allergic rhinitis   . Lumbar herniated disc   . Spinal stenosis     released from [facility].  established with [facility] (07/2013)  . History of hepatitis B 1983  . History of viral meningitis 2000  . Chronic pain syndrome     established with [facility] ([place] --> disagreement and transfered care to Dr Sanjuan Dame at [facility] clinic [facility], requests PCP write Rx but f/u with pain clinic Q6-12 months  . Liver cirrhosis secondary to NASH 01/2014    by CT scan, rec virtual colonoscopy by Dr Collene Mares 06/2014  . Nocturia more than twice per night   . CAD (coronary artery disease) cardiologist-  dr Stanford Breed    x3 with stents last 2005, EF 40%, predominantly RCA by CT 2016  . History of MI (myocardial infarction)     2000  &  2005  . AAA (abdominal aortic aneurysm) ([place])  09/2012--  monitored by dr Trula Slade    stable 5.6cm CTA abdomen 2016  . COPD (chronic obstructive pulmonary disease) ([place]) 10/2011    minimal by PFTs  . Dyspnea on exertion   . Cervical spondylosis 05/2010    s/p surgery  . DDD (degenerative disc disease)   . Hip osteoarthritis     s/p intraarticular steroid shot (12/2012) ([place])  . B12 deficiency   . Hidradenitis     right groin  . Myocardial infarction ([place])   . GERD (gastroesophageal reflux disease)   . Hepatitis     hepatitis B  . Charcot Marie Tooth muscular atrophy dx  [phone]    neurologist--  dr love--  type 2 per pt  . Headache     Past Surgical History  Procedure Laterality Date  . Tonsillectomy and adenoidectomy  1972  . Lumbar disc surgery      L5-S1  . Lumbar laminectomy  05-18-2010    L2--5   laminectomy/foraminotomy for stenosis [facility])  . Myelogram      L5-S1 and L1-2 spondylosis  . Colonoscopy  05/06/2007    normal, small int hemorrhoids rpt 5 yrs due to fmhx - rec against rpt colonoscopy by Dr Collene Mares  . Sacroiliac  joint injection Bilateral 10/2013    [place]  . Coronary angioplasty  2000  dr Stanford Breed    PCI to RCA and PDA  . Coronary angioplasty with stent placement  03-19-2005  dr Gwyndolyn Saxon [place]    inferior STEMI--- DES x4 to RCA w/ balloon angioplasty and balloon angioplasty to jailed PDA ostium/  severe hypokinesis of midinferor wall, ef 50%/  dLM 20%,  mLAD 20%,  dCFX 60%  . Cardiac catheterization  03-30-2005  dr Albertine Patricia    ef 40% w/ inferior akinesis/  LM and CFX angiographically normal/  pLAD 30%/   Widely patent stents in RCA and PDA widely patent  . Anterior cervical decomp/discectomy fusion  01-07-2010     C4 -- C7  . Cardiovascular stress test  10-23-2012  dr Stanford Breed    No ischemia/  Moderate scar in the inferior wall, otherwise normal perfusion/  LV ef 39%,  LV wall motion: inferior/ inferolateral hypokinesis  . Hydradenitis excision Right 12/31/2014    Procedure: WIDE EXCISION HIDRADENITIS  GROIN; Coralie Keens, MD    Social History   Social History  . Marital Status: Married    Spouse Name: [name]  . Number of Children: 0  . Years of Education: College   Occupational History  . Dental technician implants, crown and bridge-now disability 2006    Social History Main Topics  . Smoking status: Former Smoker -- 0.75 packs/day for 61 years    Types: Cigarettes    Start date: 06/06/1967    Quit date: 05/06/2015  . Smokeless tobacco: Never Used     Comment: stopped smoking a pipe in 2015  . Alcohol Use: No  . Drug Use: No  . Sexual Activity: Not on file   Other Topics Concern  . Not on file   Social History Narrative   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 x 5 years   caffeine: 2 cups coffee, 2 cups soda   Occupation: Neurosurgeon implants, crowne and bridge, now disability 2006   Lives with wife, 1 dog, no children    Family History  Problem Relation Age of Onset  . Cancer Mother     colon  . Diabetes Mother   . Kidney disease Mother   . Aneurysm Mother     AAA  . Rheum arthritis Mother   . Charcot-Marie-Tooth disease Mother   . Heart disease Mother     before age 44  . Cancer Father     skin  . Heart attack Father   . Heart disease Father     before age 42  . Cancer Brother     skin  . Coronary artery disease Brother   . Cancer Brother     small cell lung cancer  . Aneurysm Brother     AAA  . Rheum arthritis Sister   . Rheum arthritis Brother     Allergies as of 08/02/2015 - Review Complete 08/02/2015  Allergen Reaction Noted  . Statins Shortness Of Breath 01/22/2012  . Allopurinol Nausea Only 12/18/2012  . Baclofen Nausea And Vomiting 06/23/2015  . Gabapentin Nausea And Vomiting 04/21/2015  . Losartan  01/30/2013    Current Outpatient Prescriptions on File Prior to Visit  Medication Sig Dispense Refill  . albuterol (PROVENTIL) (2.5 MG/3ML) 0.083% nebulizer solution USE 1 VIAL PER NEBULIZER EVERY 6 HRS AS NEEDED FOR WHEEZING  75 mL 6  . aspirin 325 MG tablet Take 1 tablet (325 mg total) by mouth daily. 90 tablet 3  . Cholecalciferol (VITAMIN  D3) 50000 units TABS Take 1 tablet by mouth once a week. 12 tablet 1  . clindamycin (CLEOCIN-T) 1 % external solution Apply topically 2 (two) times daily. 30 mL 2  . DULoxetine (CYMBALTA) 60 MG capsule TAKE ONE CAPSULE BY MOUTH DAILY 30 capsule 11  . fentaNYL (DURAGESIC - DOSED MCG/HR) 50 MCG/HR Place 1 patch (50 mcg total) onto the skin every other day. 15 patch 0  . fluticasone (FLONASE) 50 MCG/ACT nasal spray Place 2 sprays into both nostrils daily as needed for allergies.     . folic acid (FOLVITE) 1 MG tablet TAKE 1 TABLET BY MOUTH ONCE A DAY 90 tablet 3  . glucose blood test strip 1 each by Other route as needed for other. Use as instructed to check sugar once daily and as needed. **ONE TOUCH ULTRA** Dx: E11.9 100 each 3  . irbesartan (AVAPRO) 75 MG tablet Take 0.5 tablets (37.5 mg total) by mouth daily. 45 tablet 6  . metFORMIN (GLUCOPHAGE) 500 MG tablet TAKE 1/2 TABLET BY MOUTH EVERY NIGHT AT BEDTIME. 30 tablet 6  . metoprolol succinate (TOPROL-XL) 25 MG 24 hr tablet TAKE 1/2 TABLET EVERY DAY 45 tablet 6  . nitroGLYCERIN (NITROSTAT) 0.4 MG SL tablet Place 1 tablet (0.4 mg total) under the tongue every 5 (five) minutes as needed for chest pain. 25 tablet 12  . oxyCODONE (ROXICODONE) 15 MG immediate release tablet Take 1 tablet (15 mg total) by mouth every 4 (four) hours as needed. 180 tablet 0  . polyethylene glycol powder (GLYCOLAX/MIRALAX) powder Take 17 g by mouth daily as needed for moderate constipation. 3350 g 0  . ULORIC 40 MG tablet TAKE 1 TABLET (40 MG TOTAL) BY MOUTH DAILY. ALLOPURINOL INTOLERANT 90 tablet 2  . vitamin B-12 (CYANOCOBALAMIN) 500 MCG tablet Take 500 mcg by mouth daily.    . baclofen (LIORESAL) 10 MG tablet Take 10 mg by mouth 2 (two) times daily. Reported on 08/02/2015    . cholecalciferol (VITAMIN D) 1000 units tablet Take 1,000 Units by mouth daily.  Reported on 08/02/2015     No current facility-administered medications on file prior to visit.     REVIEW OF SYSTEMS:  see history of present illness for pertinent positives and negatives.  Otherwise all systems negative  PHYSICAL EXAMINATION:   Vital signs are  Filed Vitals:   08/02/15 0942  BP: 105/75  Pulse: 67  Temp: 98.6 F (37 C)  TempSrc: Oral  Resp: 16  Height: 5' 8"  (1.727 m)  Weight: 313 lb (141.976 kg)  SpO2: 96%   Body mass index is 47.6 kg/(m^2). General: The patient appears their stated age. HEENT:  No gross abnormalities Pulmonary:  Non labored breathing Abdomen: Soft and non-tender , obese Musculoskeletal: There are no major deformities. Neurologic: No focal weakness or paresthesias are detected, Skin: There are no ulcer or rashes noted. Psychiatric: The patient has normal affect. Cardiovascular: There is a regular rate and rhythm without significant murmur appreciated.  groin: 1 cm skin tear in the groin crease with drainage.  No surrounding erythema  Diagnostic Studies  none  Assessment:  juxtarenal abdominal aortic aneurysm  inguinal hidradenitis Plan:  I discussed with the patient that he is on the schedule for Wednesday March eighth for fenestrated aneurysm repair.  I have given him some a BD pads to try to keep the area dry.  He is going to contact me if the wound does not heal.  V. Leia Alf, M.D. [facility] [place] Office: [phone] Pager:  [phone]

## 2015-08-05 NOTE — Pre-Procedure Instructions (Signed)
[name]  08/05/2015      [id number] [place] - [address]Phone: [phone]: [phone] [facility] [address]Phone: [phone]: [phone] MArthur [facility]ip[facility] - [address] [place]Phone: [phone]: [phone]   Your procedure is scheduled on   Wednesday  08/11/15  [facility]  Call this number if you have problems the morning of surgery:  [phone]   Remember:  Do not eat food or drink liquids after midnight.  Take these medicines the morning of surgery with A SIP OF WATER    ALBUTEROL, BACLOFEN, DULOXETINE/ CYMBALTA, METOPROLOL/ TOPROL, OXYCODONE        How to Manage Your Diabetes Before Surgery   Why is it important to control my blood sugar before and after surgery?   Improving blood sugar levels before and after surgery helps healing and can limit problems.  A way of improving blood sugar control is eating a healthy diet by:  - Eating less sugar and carbohydrates  - Increasing activity/exercise  - Talk with your doctor about reaching your blood sugar goals  High blood sugars (greater than 180 mg/dL) can raise your risk of infections and slow down your recovery so you will need to focus on controlling your diabetes during the weeks before surgery.  Make sure that the doctor who takes care of your diabetes knows about your planned surgery including the date and location.  How do I manage my blood sugars before surgery?   Check your blood sugar at least 4 times a Ware, 2 days before surgery to make sure that they are not too high or low.   Check your blood sugar the morning of your surgery when you wake up and every 2               hours until you get to the  Short-Stay unit.  If your blood sugar is less than 70 mg/dL, you will need to treat for low blood sugar by:  Treat a low blood sugar (less than 70 mg/dL) with 1/2 cup of clear juice (cranberry or apple), 4 glucose tablets, OR glucose gel.  Recheck blood sugar in 15 minutes after treatment (to make sure it is greater than 70 mg/dL).  If blood sugar is not greater than 70 mg/dL on re-check, call [phone] further instructions.   Report your blood sugar to the Short-Stay nurse when you get to Short-Stay.  References:  [facility] 2007 "How to Manage your Diabetes Before and After Surgery".  What do I do about my diabetes medications?   Do not take oral diabetes medicines (pills) the morning of surgery.    Do not take other diabetes injectables the Ware of surgery including Byetta, Victoza, Bydureon, and Trulicity.    If your CBG is greater than 220 mg/dL, you may take 1/2 of your sliding scale (correction) dose of insulin.   For patients with "Insulin Pumps":  Contact your diabetes doctor for specific instructions before surgery.   Decrease basal insulin rates by 20% at midnight the night before surgery.  Note that if your surgery is planned to be longer than 2 hours, your insulin pump will be removed and  intravenous (IV) insulin will be started and managed by the nurses and anesthesiologist.  You will be able to restart your insulin pump once you are awake and able to manage it.  Make sure to bring insulin pump supplies to the hospital with you in case your site needs to be changed.         Do not wear jewelry, make-up or nail polish.  Do not wear lotions, powders, or perfumes.  You may wear deodorant.  Do not shave 48 hours prior to surgery.  Men may shave face and neck.  Do not bring valuables to the hospital.  [facility] is not responsible for any belongings or valuables.  Contacts, dentures or bridgework may not be worn into surgery.   Leave your suitcase in the car.  After surgery it may be brought to your room.  For patients admitted to the hospital, discharge time will be determined by your treatment team.  Patients discharged the [place] of surgery will not be allowed to drive home.   Name and phone number of your driver:   Special instructions: SEE PREPARING FOR SURGERY  Please read over the following fact sheets that you were given. Pain Booklet, Coughing and Deep Breathing, Blood Transfusion Information, MRSA Information and Surgical Site Infection Prevention

## 2015-08-06 ENCOUNTER — Encounter (HOSPITAL_COMMUNITY)
Admission: RE | Admit: 2015-08-06 | Discharge: 2015-08-06 | Disposition: A | Discharge status: Home / Self Care | Payer: Medicare PPO | Source: Ambulatory Visit | Attending: Surgery | Admitting: Surgery

## 2015-08-06 ENCOUNTER — Encounter (HOSPITAL_COMMUNITY): Payer: Self-pay

## 2015-08-06 DIAGNOSIS — I714 Abdominal aortic aneurysm, without rupture: Secondary | ICD-10-CM | POA: Diagnosis not present

## 2015-08-06 DIAGNOSIS — Z01818 Encounter for other preprocedural examination: Secondary | ICD-10-CM | POA: Insufficient documentation

## 2015-08-06 DIAGNOSIS — Z0183 Encounter for blood typing: Secondary | ICD-10-CM | POA: Insufficient documentation

## 2015-08-06 DIAGNOSIS — Z01812 Encounter for preprocedural laboratory examination: Secondary | ICD-10-CM | POA: Insufficient documentation

## 2015-08-06 LAB — COMPREHENSIVE METABOLIC PANEL
ALBUMIN: 2.8 g/dL — AB (ref 3.5–5.0)
ALT: 25 U/L (ref 17–63)
AST: 37 U/L (ref 15–41)
Alkaline Phosphatase: 164 U/L — ABNORMAL HIGH (ref 38–126)
Anion gap: 11 (ref 5–15)
BUN: 11 mg/dL (ref 6–20)
CHLORIDE: 104 mmol/L (ref 101–111)
CO2: 21 mmol/L — AB (ref 22–32)
CREATININE: 0.82 mg/dL (ref 0.61–1.24)
Calcium: 8.7 mg/dL — ABNORMAL LOW (ref 8.9–10.3)
GFR calc Af Amer: >60 mL/min (ref >60)
GLUCOSE: 174 mg/dL — AB (ref 65–99)
POTASSIUM: 4.3 mmol/L (ref 3.5–5.1)
Sodium: 136 mmol/L (ref 135–145)
Total Bilirubin: 0.9 mg/dL (ref 0.3–1.2)
Total Protein: 8.4 g/dL — ABNORMAL HIGH (ref 6.5–8.1)

## 2015-08-06 LAB — URINALYSIS, ROUTINE W REFLEX MICROSCOPIC
BILIRUBIN URINE: NEGATIVE
Glucose, UA: 100 mg/dL — AB
Hgb urine dipstick: NEGATIVE
KETONES UR: NEGATIVE mg/dL
LEUKOCYTES UA: NEGATIVE
NITRITE: NEGATIVE
PH: 5.5 (ref 5.0–8.0)
PROTEIN: NEGATIVE mg/dL
Specific Gravity, Urine: 1.017 (ref 1.005–1.030)

## 2015-08-06 LAB — TYPE AND SCREEN
ABO/RH(D): O POS
ANTIBODY SCREEN: NEGATIVE

## 2015-08-06 LAB — CBC
HCT: 39.1 % (ref 39.0–52.0)
Hemoglobin: 12.5 g/dL — ABNORMAL LOW (ref 13.0–17.0)
MCH: 33 pg (ref 26.0–34.0)
MCHC: 32 g/dL (ref 30.0–36.0)
MCV: 103.2 fL — AB (ref 78.0–100.0)
PLATELETS: 92 10*3/uL — AB (ref 150–400)
RBC: 3.79 MIL/uL — AB (ref 4.22–5.81)
RDW: 14.7 % (ref 11.5–15.5)
WBC: 6.7 10*3/uL (ref 4.0–10.5)

## 2015-08-06 LAB — BLOOD GAS, ARTERIAL
ACID-BASE EXCESS: 0.1 mmol/L (ref 0.0–2.0)
Bicarbonate: 24.7 mEq/L — ABNORMAL HIGH (ref 20.0–24.0)
DRAWN BY: 206361
FIO2: 0.21
O2 Saturation: 96.2 %
PATIENT TEMPERATURE: 98.6
PCO2 ART: 43.2 mmHg (ref 35.0–45.0)
PH ART: 7.375 (ref 7.350–7.450)
TCO2: 26 mmol/L (ref 0–100)
pO2, Arterial: 90.1 mmHg (ref 80.0–100.0)

## 2015-08-06 LAB — PROTIME-INR
INR: 1.18 (ref 0.00–1.49)
Prothrombin Time: 15.2 seconds (ref 11.6–15.2)

## 2015-08-06 LAB — SURGICAL PCR SCREEN
MRSA, PCR: NEGATIVE
STAPHYLOCOCCUS AUREUS: NEGATIVE

## 2015-08-06 LAB — GLUCOSE, CAPILLARY: Glucose-Capillary: 90 mg/dL (ref 65–99)

## 2015-08-06 LAB — APTT: APTT: 32 s (ref 24–37)

## 2015-08-06 NOTE — Progress Notes (Signed)
SPOKE WITH [name] AT DR. BRABHAM'S OFFICE WHO STATED PATIENT DID NOT NEED TO STOP ASPIRIN.

## 2015-08-07 LAB — HEMOGLOBIN A1C
Hgb A1c MFr Bld: 6.1 % — ABNORMAL HIGH (ref 4.8–5.6)
Mean Plasma Glucose: 128 mg/dL

## 2015-08-09 ENCOUNTER — Encounter (HOSPITAL_COMMUNITY): Payer: Self-pay | Admitting: Vascular Surgery

## 2015-08-09 NOTE — Progress Notes (Signed)
Anesthesia Chart Review: See my note from 06/24/15. Patient was scheduled for FEVAR juxtarenal AAA on 07/02/15 by Dr. Trula Slade, but was postponed 06/30/15 due to patient developing a new right groin "boil" that was draining. He has known history of bilateral groin hidradenitis and was referred back to general surgery and placed on antibiotics. This was the second time surgery had been postponed due to hidradenitis suppurativa, and he actually underwent excision with Dr. Ninfa Linden on 12/31/14. Patient was seen in follow-up by Dr. Trula Slade on 08/02/15 with apparently still having some right groin drainage. Surgery was rescheduled for 08/11/15 with instructions to let Dr. Trula Slade know if the wound does not heal.   Patient originally had cardiac clearance from Dr. Stanford Breed in [place] note dated 09/01/14 and in office visit note from [place], PA-C on 10/08/14.   New labs (done 08/06/15) since my last note showed Cr 0.82. Glucose 174. WBC 6.7. H/H 12.5/39.1. PLT count 92K (up from 77K on 06/23/15). PT/PTT WNL. A1c 6.1. T&S done.   If Dr. Trula Slade feels groin appearance is stable for surgery and there are otherwise no acute changes then I would anticipate that he could proceed as planned.  [facility] Short Stay Center/Anesthesiology Phone [phone] 08/09/2015 12:02 PM

## 2015-08-10 ENCOUNTER — Telehealth: Payer: Self-pay

## 2015-08-10 MED ORDER — CHLORHEXIDINE GLUCONATE CLOTH 2 % EX PADS: 6.0000 | MEDICATED_PAD | Freq: Once | CUTANEOUS | Status: DC

## 2015-08-10 MED ORDER — DEXTROSE 5 % IV SOLN: 1.5000 g | INTRAVENOUS | Status: DC

## 2015-08-10 MED ORDER — SODIUM CHLORIDE 0.9 % IV SOLN: INTRAVENOUS | Status: DC

## 2015-08-10 NOTE — Telephone Encounter (Signed)
Phone call to pt. To check on the status of groin drainage.  Reported there is a small amt. of pale red groin drainage from right groin.  Stated "it seems a little better."  Denied fever.  Reported night sweats.  Advised pt. That Dr. Trula Slade is recommending to cancel the Fenestrated Stent Graft Repair of his AAA, "if any drainage is present."   Advised him to keep our office updated re: status of drainage.  Will contact him with Dr. Stephens Shire next recommendation.  Pt. Verb. Understanding.

## 2015-08-11 ENCOUNTER — Inpatient Hospital Stay (HOSPITAL_COMMUNITY): Admission: RE | Admit: 2015-08-11 | Payer: Medicare PPO | Source: Ambulatory Visit | Admitting: Surgery

## 2015-08-11 ENCOUNTER — Encounter (HOSPITAL_COMMUNITY): Admission: RE | Payer: Self-pay | Source: Ambulatory Visit

## 2015-08-11 SURGERY — ABDOMINAL AORTIC ENDOVASCULAR FENESTRATED STENT GRAFT
Anesthesia: General

## 2015-08-16 ENCOUNTER — Telehealth: Payer: Self-pay | Admitting: Family Medicine

## 2015-08-16 ENCOUNTER — Other Ambulatory Visit: Payer: Medicare PPO

## 2015-08-16 MED ORDER — OXYCODONE HCL 15 MG PO TABS: 15.0000 mg | ORAL_TABLET | ORAL | Status: DC | PRN

## 2015-08-16 MED ORDER — FENTANYL 50 MCG/HR TD PT72: 50.0000 ug | MEDICATED_PATCH | TRANSDERMAL | Status: DC

## 2015-08-16 NOTE — Telephone Encounter (Signed)
Patient walked in to pick up controlled substance Rx's without previously requesting them. Advised patient that he needed to give [place] 48 hrs notice for refills so that we have time to get them ready. He verbalized understanding. He also mentioned that he "can't breathe" x 5 days. +DOE and cough. -fever and CP. No appts available today in the office. Scheduled for tomorrow with RB and advised to go to [facility] if worsening today or if he feels the need to be seen today. He verbalized understanding.

## 2015-08-16 NOTE — Telephone Encounter (Signed)
plz document request. plz remind he needs to notify us with 2d advance for future refills. If needs evaluation today he needs to see [facility] otherwise plz schedule next available appt.

## 2015-08-17 ENCOUNTER — Ambulatory Visit (INDEPENDENT_AMBULATORY_CARE_PROVIDER_SITE_OTHER): Payer: Medicare PPO | Admitting: Internal Medicine

## 2015-08-17 ENCOUNTER — Encounter: Payer: Self-pay | Admitting: Internal Medicine

## 2015-08-17 VITALS — BP 126/78 | HR 81 | Temp 98.6°F | Wt 310.0 lb

## 2015-08-17 DIAGNOSIS — J441 Chronic obstructive pulmonary disease with (acute) exacerbation: Secondary | ICD-10-CM | POA: Diagnosis not present

## 2015-08-17 MED ORDER — ALBUTEROL SULFATE HFA 108 (90 BASE) MCG/ACT IN AERS: 2.0000 | INHALATION_SPRAY | Freq: Four times a day (QID) | RESPIRATORY_TRACT | Status: DC | PRN

## 2015-08-17 MED ORDER — IPRATROPIUM BROMIDE 0.02 % IN SOLN
0.5000 mg | Freq: Once | RESPIRATORY_TRACT | Status: AC
  Administered 2015-08-17: 0.5 mg via RESPIRATORY_TRACT

## 2015-08-17 MED ORDER — ALBUTEROL SULFATE (2.5 MG/3ML) 0.083% IN NEBU
2.5000 mg | INHALATION_SOLUTION | Freq: Once | RESPIRATORY_TRACT | Status: AC
  Administered 2015-08-17: 2.5 mg via RESPIRATORY_TRACT

## 2015-08-17 MED ORDER — AZITHROMYCIN 250 MG PO TABS: ORAL_TABLET | ORAL | Status: DC

## 2015-08-17 NOTE — Progress Notes (Signed)
HPI  Pt presents to the clinic today with c/o runny nose, sore throat, cough and chest congestion. This started 1 week ago. He is blowing yellow mucous out of his nose. The cough is non productive. He does have some associated shortness of breath. He denies fever, chills or body aches. He has tried Mucinex with minimal relief. He does have a history of COPD, DM 2, and allergies. He takes Flonase daily. He has a Abuterol nebulizer that he has had to use in the last few weeks. He is UTD on his flu and pneumonia vaccines. He has had sick contacts.  Review of Systems      Past Medical History  Diagnosis Date  . Ischemic cardiomyopathy     s/p inferior MI  --  current ef per myoview 39%  . T2DM (type 2 diabetes mellitus) ([place])     ABIs WNL 2016  . Disturbances of sensation of smell and taste   . Gout   . Hidradenitis suppurativa dx 2011   goin and leg crease    followd by Lyndle Herrlich - daily bactrim, s/p intralesional steroid injection 10/2010  . HLD (hyperlipidemia)   . HTN (hypertension)   . Obesity   . Vitamin D deficiency   . Allergic rhinitis   . Lumbar herniated disc   . Spinal stenosis     released from [place].  established with preferred pain (07/2013)  . History of hepatitis B 1983  . History of viral meningitis 2000  . Chronic pain syndrome     established with [facility] ([place]) --> disagreement and transfered care to Dr Sanjuan Dame at [facility] clinic [facility], requests PCP write Rx but f/u with pain clinic Q6-12 months  . Liver cirrhosis secondary to NASH 01/2014    by CT scan, rec virtual colonoscopy by Dr Collene Mares 06/2014  . Nocturia more than twice per night   . CAD (coronary artery disease) cardiologist-  dr Stanford Breed    x3 with stents last 2005, EF 40%, predominantly RCA by CT 2016  . History of MI (myocardial infarction)     2000  &  2005  . AAA (abdominal aortic aneurysm) ([place]) 09/2012--  monitored by dr Trula Slade    stable 5.6cm CTA abdomen 2016  . COPD (chronic  obstructive pulmonary disease) ([place]) 10/2011    minimal by PFTs  . Dyspnea on exertion   . Cervical spondylosis 05/2010    s/p surgery  . DDD (degenerative disc disease)   . Hip osteoarthritis     s/p intraarticular steroid shot (12/2012) (Ibazebo/Caffrey)  . B12 deficiency   . Hidradenitis     right groin  . Myocardial infarction ([place])   . GERD (gastroesophageal reflux disease)   . Hepatitis     hepatitis B  . Charcot Marie Tooth muscular atrophy dx  [phone]    neurologist--  dr love--  type 2 per pt  . Headache     Family History  Problem Relation Age of Onset  . Cancer Mother     colon  . Diabetes Mother   . Kidney disease Mother   . Aneurysm Mother     AAA  . Rheum arthritis Mother   . Charcot-Marie-Tooth disease Mother   . Heart disease Mother     before age 93  . Cancer Father     skin  . Heart attack Father   . Heart disease Father     before age 78  . Cancer Brother  skin  . Coronary artery disease Brother   . Cancer Brother     small cell lung cancer  . Aneurysm Brother     AAA  . Rheum arthritis Sister   . Rheum arthritis Brother     Social History   Social History  . Marital Status: Married    Spouse Name: [name]  . Number of Children: 0  . Years of Education: College   Occupational History  . Dental technician implants, crown and bridge-now disability 2006    Social History Main Topics  . Smoking status: Former Smoker -- 0.75 packs/day for 34 years    Types: Cigarettes    Start date: 06/06/1967    Quit date: 05/06/2015  . Smokeless tobacco: Never Used     Comment: stopped smoking a pipe in 2015  DOES SMOKE E CIG  . Alcohol Use: No  . Drug Use: Yes    Special: Fentanyl  . Sexual Activity: Not on file   Other Topics Concern  . Not on file   Social History Narrative   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 x 5 years   caffeine: 2 cups coffee, 2 cups soda   Occupation: Neurosurgeon implants, crowne and bridge, now disability  2006   Lives with wife, 1 dog, no children    Allergies  Allergen Reactions  . Statins Shortness Of Breath    Cough, trouble breathing  . Losartan Other (See Comments)    Causes him to have pain  . Allopurinol Nausea Only  . Baclofen Nausea And Vomiting  . Gabapentin Nausea And Vomiting     Constitutional: Positive fatigue. Denies headache, fever or abrupt weight changes.  HEENT:  Positive runny nose, sore throat. Denies eye redness, eye pain, pressure behind the eyes, facial pain, nasal congestion, ear pain, ringing in the ears, wax buildup, or bloody nose. Respiratory: Positive cough and shortness of breath. Denies difficulty breathing or shortness of breath.  Cardiovascular: Denies chest pain, chest tightness, palpitations or swelling in the hands or feet.   No other specific complaints in a complete review of systems (except as listed in HPI above).  Objective:   BP 126/78 mmHg  Pulse 81  Temp(Src) 98.6 F (37 C) (Oral)  Wt 310 lb (140.615 kg)  SpO2 94%  Wt Readings from Last 3 Encounters:  08/17/15 310 lb (140.615 kg)  08/06/15 313 lb 9.6 oz (142.248 kg)  08/02/15 313 lb (141.976 kg)     General: Appears his stated age, obese, chronically ill appearing, in NAD. HEENT: Head: normal shape and size, no sinus tenderness noted; Eyes: sclera white, no icterus, conjunctiva pink; Throat/Mouth: Teeth present, mucosa erythematous and dry, no exudate noted, no lesions or ulcerations noted.  Neck: No cervical lymphadenopathy.  Cardiovascular: Normal rate and rhythm. Distant S1,S2 noted.  Pulmonary/Chest: Normal effort and scattered rhonchi and expiratory wheezing noted. No respiratory distress.      Assessment & Plan:   COPD exacerbation:  Albuterol neb in office today Get some rest and drink plenty of water Do salt water gargles for the sore throat eRx for Azithromax x 5 days Delysm as needed for cough  RTC as needed or if symptoms persist.

## 2015-08-17 NOTE — Progress Notes (Signed)
Pre visit review using our clinic review tool, if applicable. No additional management support is needed unless otherwise documented below in the visit note. 

## 2015-08-17 NOTE — Patient Instructions (Signed)

## 2015-08-17 NOTE — Addendum Note (Signed)
Addended by: Lurlean Nanny on: 08/17/2015 04:53 PM   Modules accepted: Orders

## 2015-09-09 ENCOUNTER — Other Ambulatory Visit: Payer: Self-pay

## 2015-09-09 NOTE — Telephone Encounter (Signed)
Pt left /vm requesting rx fentanyl (last printed # 15 on 08/16/15) and oxycodone (last printed # 180 on 08/16/15) pt has enough med to last until 09/10/15. Pt request to pick up on 09/10/15; ? Early. Last seen 06/18/15.

## 2015-09-10 MED ORDER — OXYCODONE HCL 15 MG PO TABS: 15.0000 mg | ORAL_TABLET | ORAL | Status: DC | PRN

## 2015-09-10 MED ORDER — FENTANYL 50 MCG/HR TD PT72: 50.0000 ug | MEDICATED_PATCH | TRANSDERMAL | Status: DC

## 2015-09-10 NOTE — Telephone Encounter (Signed)
Message left advising patient and Rx's placed up front for pick up.

## 2015-09-10 NOTE — Telephone Encounter (Signed)
Printed and in [place]' box. He is early - last filled 08/16/2015. Wrote on script ok to fill 09/12/2015. Pharmacy however may not fill until 9/61/1643 if in their policy.

## 2015-09-13 ENCOUNTER — Other Ambulatory Visit: Payer: Self-pay | Admitting: Family Medicine

## 2015-09-23 DIAGNOSIS — L820 Inflamed seborrheic keratosis: Secondary | ICD-10-CM | POA: Diagnosis not present

## 2015-09-23 DIAGNOSIS — L304 Erythema intertrigo: Secondary | ICD-10-CM | POA: Diagnosis not present

## 2015-10-04 ENCOUNTER — Other Ambulatory Visit: Payer: Self-pay

## 2015-10-04 MED ORDER — OXYCODONE HCL 15 MG PO TABS: 15.0000 mg | ORAL_TABLET | ORAL | Status: DC | PRN

## 2015-10-04 NOTE — Telephone Encounter (Signed)
[name] (Do not see DPR) left v/m requesting rx oxycodone. Call when ready for pick up. Last printed # 180 on 09/10/15;last seen by Dr Darnell Level 06/18/15.Please advise.

## 2015-10-04 NOTE — Telephone Encounter (Signed)
Printed and in [employer]

## 2015-10-05 NOTE — Telephone Encounter (Signed)
Message left advising patient. Rx placed up front for pick up.

## 2015-10-21 ENCOUNTER — Encounter: Payer: Self-pay | Admitting: Surgery

## 2015-10-21 DIAGNOSIS — L304 Erythema intertrigo: Secondary | ICD-10-CM | POA: Diagnosis not present

## 2015-10-25 ENCOUNTER — Ambulatory Visit: Payer: Medicare PPO | Admitting: Surgery

## 2015-10-28 ENCOUNTER — Telehealth: Payer: Self-pay | Admitting: *Deleted

## 2015-10-28 DIAGNOSIS — M25551 Pain in right hip: Secondary | ICD-10-CM | POA: Diagnosis not present

## 2015-10-28 NOTE — Telephone Encounter (Signed)
Fentanyl requiring PA. Completed and approved via Cover My Meds. Pharmacy notified.

## 2015-11-02 ENCOUNTER — Encounter: Payer: Self-pay | Admitting: Surgery

## 2015-11-08 ENCOUNTER — Ambulatory Visit (INDEPENDENT_AMBULATORY_CARE_PROVIDER_SITE_OTHER): Payer: Medicare PPO | Admitting: Surgery

## 2015-11-08 ENCOUNTER — Encounter: Payer: Self-pay | Admitting: Surgery

## 2015-11-08 VITALS — BP 116/80 | HR 86 | Temp 97.8°F | Resp 18 | Ht 67.0 in | Wt 301.7 lb

## 2015-11-08 DIAGNOSIS — I714 Abdominal aortic aneurysm, without rupture, unspecified: Secondary | ICD-10-CM

## 2015-11-08 DIAGNOSIS — I712 Thoracic aortic aneurysm, without rupture, unspecified: Secondary | ICD-10-CM

## 2015-11-08 DIAGNOSIS — Z01810 Encounter for preprocedural cardiovascular examination: Secondary | ICD-10-CM

## 2015-11-08 NOTE — Progress Notes (Signed)
Vascular and Vein Specialist of [facility]  Patient name: [name] MRN: [id number] DOB: Aug 16, 1955 Sex: male  REASON FOR VISIT: AAA  HPI: The patient is here today for a wound check. He has previously undergone resection of bilateral inguinal hidradenitis by Dr. Rush Farmer. We are awaiting his incisions to heal so that we can proceed with fenestrated endovascular aneurysm repair. He is doing everything he can at home to keep these areas dry.  He does not have any fevers or chills.  He recently saw a dermatologist who said this is the best the wound has looked in a long time.  He denies having any abdominal pain.  Past Medical History  Diagnosis Date  . Ischemic cardiomyopathy     s/p inferior MI  --  current ef per myoview 39%  . T2DM (type 2 diabetes mellitus) ([place])     ABIs WNL 2016  . Disturbances of sensation of smell and taste   . Gout   . Hidradenitis suppurativa dx 2011   goin and leg crease    followd by Lyndle Herrlich - daily bactrim, s/p intralesional steroid injection 10/2010  . HLD (hyperlipidemia)   . HTN (hypertension)   . Obesity   . Vitamin D deficiency   . Allergic rhinitis   . Lumbar herniated disc   . Spinal stenosis     released from [place].  established with preferred pain (07/2013)  . History of hepatitis B 1983  . History of viral meningitis 2000  . Chronic pain syndrome     established with [facility] pain clinic ([place]) --> disagreement and transfered care to Dr Sanjuan Dame at [facility] pain clinic [facility], requests PCP write Rx but f/u with pain clinic Q6-12 months  . Liver cirrhosis secondary to NASH 01/2014    by CT scan, rec virtual colonoscopy by Dr Collene Mares 06/2014  . Nocturia more than twice per night   . CAD (coronary artery disease) cardiologist-  dr Stanford Breed    x3 with stents last 2005, EF 40%, predominantly RCA by CT 2016  . History of MI (myocardial infarction)     2000  &  2005  . AAA (abdominal aortic aneurysm)  ([place]) 09/2012--  monitored by dr Trula Slade    stable 5.6cm CTA abdomen 2016  . COPD (chronic obstructive pulmonary disease) ([place]) 10/2011    minimal by PFTs  . Dyspnea on exertion   . Cervical spondylosis 05/2010    s/p surgery  . DDD (degenerative disc disease)   . Hip osteoarthritis     s/p intraarticular steroid shot (12/2012) (Ibazebo/Caffrey)  . B12 deficiency   . Hidradenitis     right groin  . Myocardial infarction ([place])   . GERD (gastroesophageal reflux disease)   . Hepatitis     hepatitis B  . Charcot Marie Tooth muscular atrophy dx  [phone]    neurologist--  dr love--  type 2 per pt  . Headache     Family History  Problem Relation Age of Onset  . Cancer Mother     colon  . Diabetes Mother   . Kidney disease Mother   . Aneurysm Mother     AAA  . Rheum arthritis Mother   . Charcot-Marie-Tooth disease Mother   . Heart disease Mother     before age 4  . Cancer Father     skin  . Heart attack Father   . Heart disease Father     before age 67  . Cancer Brother  skin  . Coronary artery disease Brother   . Cancer Brother     small cell lung cancer  . Aneurysm Brother     AAA  . Rheum arthritis Sister   . Rheum arthritis Brother     SOCIAL HISTORY: Social History  Substance Use Topics  . Smoking status: Former Smoker -- 0.75 packs/day for 81 years    Types: Cigarettes    Start date: 06/06/1967    Quit date: 05/06/2015  . Smokeless tobacco: Never Used     Comment: stopped smoking a pipe in 2015  DOES SMOKE E CIG  . Alcohol Use: No    Allergies  Allergen Reactions  . Statins Shortness Of Breath    Cough, trouble breathing  . Losartan Other (See Comments)    Causes him to have pain  . Allopurinol Nausea Only  . Baclofen Nausea And Vomiting  . Gabapentin Nausea And Vomiting    Current Outpatient Prescriptions  Medication Sig Dispense Refill  . albuterol (PROVENTIL HFA;VENTOLIN HFA) 108 (90 Base) MCG/ACT inhaler Inhale 2 puffs into the lungs every 6  (six) hours as needed for wheezing or shortness of breath. 1 Inhaler 0  . albuterol (PROVENTIL) (2.5 MG/3ML) 0.083% nebulizer solution USE 1 VIAL PER NEBULIZER EVERY 6 HRS AS NEEDED FOR WHEEZING 75 mL 6  . aspirin 325 MG tablet Take 1 tablet (325 mg total) by mouth daily. 90 tablet 3  . cholecalciferol (VITAMIN D) 1000 units tablet Take 1,000 Units by mouth daily. Reported on 08/02/2015    . Cholecalciferol (VITAMIN D3) 50000 units TABS Take 1 tablet by mouth once a week. 12 tablet 1  . clindamycin (CLEOCIN-T) 1 % external solution Apply topically 2 (two) times daily. 30 mL 2  . DULoxetine (CYMBALTA) 60 MG capsule TAKE ONE CAPSULE BY MOUTH DAILY 30 capsule 11  . fentaNYL (DURAGESIC - DOSED MCG/HR) 50 MCG/HR Place 1 patch (50 mcg total) onto the skin every other day. 15 patch 0  . fluticasone (FLONASE) 50 MCG/ACT nasal spray Place 2 sprays into both nostrils daily as needed for allergies.     . folic acid (FOLVITE) 1 MG tablet TAKE 1 TABLET BY MOUTH DAILY 90 tablet 0  . glucose blood test strip 1 each by Other route as needed for other. Use as instructed to check sugar once daily and as needed. **ONE TOUCH ULTRA** Dx: E11.9 100 each 3  . irbesartan (AVAPRO) 75 MG tablet Take 0.5 tablets (37.5 mg total) by mouth daily. 45 tablet 6  . metFORMIN (GLUCOPHAGE) 500 MG tablet TAKE 1/2 TABLET BY MOUTH EVERY NIGHT AT BEDTIME. 30 tablet 6  . metoprolol succinate (TOPROL-XL) 25 MG 24 hr tablet TAKE 1/2 TABLET EVERY DAY 45 tablet 6  . Misc Natural Products (TART CHERRY ADVANCED) CAPS Take 1 capsule by mouth 2 (two) times daily.    . nitroGLYCERIN (NITROSTAT) 0.4 MG SL tablet Place 1 tablet (0.4 mg total) under the tongue every 5 (five) minutes as needed for chest pain. 25 tablet 12  . oxyCODONE (ROXICODONE) 15 MG immediate release tablet Take 1 tablet (15 mg total) by mouth every 4 (four) hours as needed. 180 tablet 0  . polyethylene glycol powder (GLYCOLAX/MIRALAX) powder Take 17 g by mouth daily as needed for  moderate constipation. 3350 g 0  . ULORIC 40 MG tablet TAKE 1 TABLET (40 MG TOTAL) BY MOUTH DAILY. ALLOPURINOL INTOLERANT 90 tablet 2  . vitamin B-12 (CYANOCOBALAMIN) 500 MCG tablet Take 500 mcg by mouth daily.    [name]  azithromycin (ZITHROMAX) 250 MG tablet Take 2 tabs today, then 1 tab daily x 4 days (Patient not taking: Reported on 11/08/2015) 6 tablet 0  . baclofen (LIORESAL) 10 MG tablet Take 10 mg by mouth 2 (two) times daily. Reported on 11/08/2015     No current facility-administered medications for this visit.    REVIEW OF SYSTEMS:  [X]  denotes positive finding, [ ]  denotes negative finding Cardiac  Comments:  Chest pain or chest pressure:    Shortness of breath upon exertion: x   Short of breath when lying flat: x   Irregular heart rhythm:        Vascular    Pain in calf, thigh, or hip brought on by ambulation: x   Pain in feet at night that wakes you up from your sleep:  x   Blood clot in your veins:    Leg swelling:         Pulmonary    Oxygen at home:    Productive cough:     Wheezing:  x       Neurologic    Sudden weakness in arms or legs:  x   Sudden numbness in arms or legs:  x   Sudden onset of difficulty speaking or slurred speech:    Temporary loss of vision in one eye:     Problems with dizziness:  x       Gastrointestinal    Blood in stool:     Vomited blood:         Genitourinary    Burning when urinating:     Blood in urine:        Psychiatric    Major depression:         Hematologic    Bleeding problems: x   Problems with blood clotting too easily:        Skin    Rashes or ulcers: x       Constitutional    Fever or chills:      PHYSICAL EXAM: Filed Vitals:   11/08/15 1336  BP: 116/80  Pulse: 86  Temp: 97.8 F (36.6 C)  TempSrc: Oral  Resp: 18  Height: 5' 7"  (1.702 m)  Weight: 301 lb 11.2 oz (136.85 kg)  SpO2: 94%    GENERAL: The patient is a well-nourished male, in no acute distress. The vital signs are documented above. CARDIAC:  There is a regular rate and rhythm.  VASCULAR: Palpable femoral pulses PULMONARY: There is good air exchange bilaterally without wheezing or rales. ABDOMEN: Soft and non-tender with normal pitched bowel sounds.  MUSCULOSKELETAL: There are no major deformities or cyanosis. NEUROLOGIC: No focal weakness or paresthesias are detected. SKIN: The right groin is essentially healed PSYCHIATRIC: The patient has a normal affect.  DATA:  None  MEDICAL ISSUES: I feel this is the best the patient's groins are going to look and we should proceed with endovascular repair of his aneurysm.  I'm repeating his CT scan to make sure the device that we ordered last year.  Fit and exclude his aneurysm.  I have placed him on the schedule for Tuesday, June 27.  We discussed the risks and benefits of the procedure again in detail.  All his questions were answered    [name], MD Vascular and Vein Specialists of [facility] [phone] Pager [phone]

## 2015-11-08 NOTE — Addendum Note (Signed)
Addended by: Mena Goes on: 11/08/2015 04:54 PM   Modules accepted: Orders

## 2015-11-09 ENCOUNTER — Other Ambulatory Visit: Payer: Self-pay

## 2015-11-17 DIAGNOSIS — Z716 Tobacco abuse counseling: Secondary | ICD-10-CM | POA: Diagnosis not present

## 2015-11-17 DIAGNOSIS — M545 Low back pain: Secondary | ICD-10-CM | POA: Diagnosis not present

## 2015-11-17 DIAGNOSIS — F17200 Nicotine dependence, unspecified, uncomplicated: Secondary | ICD-10-CM | POA: Diagnosis not present

## 2015-11-17 DIAGNOSIS — M542 Cervicalgia: Secondary | ICD-10-CM | POA: Diagnosis not present

## 2015-11-17 DIAGNOSIS — G894 Chronic pain syndrome: Secondary | ICD-10-CM | POA: Diagnosis not present

## 2015-11-17 DIAGNOSIS — Z79891 Long term (current) use of opiate analgesic: Secondary | ICD-10-CM | POA: Diagnosis not present

## 2015-11-17 DIAGNOSIS — Z720 Tobacco use: Secondary | ICD-10-CM | POA: Diagnosis not present

## 2015-11-19 ENCOUNTER — Ambulatory Visit
Admission: RE | Admit: 2015-11-19 | Discharge: 2015-11-19 | Disposition: A | Discharge status: Home / Self Care | Payer: Medicare PPO | Source: Ambulatory Visit | Attending: Surgery | Admitting: Surgery

## 2015-11-19 DIAGNOSIS — Z01818 Encounter for other preprocedural examination: Secondary | ICD-10-CM | POA: Diagnosis not present

## 2015-11-19 DIAGNOSIS — I714 Abdominal aortic aneurysm, without rupture, unspecified: Secondary | ICD-10-CM

## 2015-11-19 DIAGNOSIS — I712 Thoracic aortic aneurysm, without rupture, unspecified: Secondary | ICD-10-CM

## 2015-11-19 DIAGNOSIS — Z01810 Encounter for preprocedural cardiovascular examination: Secondary | ICD-10-CM

## 2015-11-19 MED ORDER — IOPAMIDOL (ISOVUE-370) INJECTION 76%
75.0000 mL | Freq: Once | INTRAVENOUS | Status: AC | PRN
  Administered 2015-11-19: 75 mL via INTRAVENOUS

## 2015-11-22 ENCOUNTER — Encounter (HOSPITAL_COMMUNITY): Payer: Self-pay

## 2015-11-22 ENCOUNTER — Encounter (HOSPITAL_COMMUNITY)
Admission: RE | Admit: 2015-11-22 | Discharge: 2015-11-22 | Disposition: A | Discharge status: Home / Self Care | Payer: Medicare PPO | Source: Ambulatory Visit | Attending: Surgery | Admitting: Surgery

## 2015-11-22 DIAGNOSIS — Z87891 Personal history of nicotine dependence: Secondary | ICD-10-CM | POA: Diagnosis not present

## 2015-11-22 DIAGNOSIS — Z0183 Encounter for blood typing: Secondary | ICD-10-CM | POA: Insufficient documentation

## 2015-11-22 DIAGNOSIS — I714 Abdominal aortic aneurysm, without rupture: Secondary | ICD-10-CM | POA: Diagnosis not present

## 2015-11-22 DIAGNOSIS — Z01818 Encounter for other preprocedural examination: Secondary | ICD-10-CM | POA: Diagnosis not present

## 2015-11-22 DIAGNOSIS — Z955 Presence of coronary angioplasty implant and graft: Secondary | ICD-10-CM | POA: Diagnosis not present

## 2015-11-22 DIAGNOSIS — E119 Type 2 diabetes mellitus without complications: Secondary | ICD-10-CM | POA: Insufficient documentation

## 2015-11-22 DIAGNOSIS — R9431 Abnormal electrocardiogram [ECG] [EKG]: Secondary | ICD-10-CM | POA: Diagnosis not present

## 2015-11-22 DIAGNOSIS — I255 Ischemic cardiomyopathy: Secondary | ICD-10-CM | POA: Diagnosis not present

## 2015-11-22 DIAGNOSIS — Z01812 Encounter for preprocedural laboratory examination: Secondary | ICD-10-CM | POA: Insufficient documentation

## 2015-11-22 DIAGNOSIS — I10 Essential (primary) hypertension: Secondary | ICD-10-CM | POA: Diagnosis not present

## 2015-11-22 DIAGNOSIS — E785 Hyperlipidemia, unspecified: Secondary | ICD-10-CM | POA: Diagnosis not present

## 2015-11-22 DIAGNOSIS — I2510 Atherosclerotic heart disease of native coronary artery without angina pectoris: Secondary | ICD-10-CM | POA: Diagnosis not present

## 2015-11-22 DIAGNOSIS — K7581 Nonalcoholic steatohepatitis (NASH): Secondary | ICD-10-CM | POA: Insufficient documentation

## 2015-11-22 DIAGNOSIS — I252 Old myocardial infarction: Secondary | ICD-10-CM | POA: Diagnosis not present

## 2015-11-22 DIAGNOSIS — J449 Chronic obstructive pulmonary disease, unspecified: Secondary | ICD-10-CM | POA: Insufficient documentation

## 2015-11-22 HISTORY — DX: Unspecified cataract: H26.9

## 2015-11-22 HISTORY — DX: Personal history of pneumonia (recurrent): Z87.01

## 2015-11-22 LAB — SURGICAL PCR SCREEN
MRSA, PCR: NEGATIVE
STAPHYLOCOCCUS AUREUS: NEGATIVE

## 2015-11-22 LAB — BLOOD GAS, ARTERIAL
Acid-Base Excess: 3.1 mmol/L — ABNORMAL HIGH (ref 0.0–2.0)
BICARBONATE: 27.4 meq/L — AB (ref 20.0–24.0)
Drawn by: 421801
FIO2: 0.21
O2 SAT: 95.8 %
PATIENT TEMPERATURE: 98.6
PO2 ART: 80.6 mmHg (ref 80.0–100.0)
TCO2: 28.8 mmol/L (ref 0–100)
pCO2 arterial: 44.3 mmHg (ref 35.0–45.0)
pH, Arterial: 7.409 (ref 7.350–7.450)

## 2015-11-22 LAB — CBC
HEMATOCRIT: 34.9 % — AB (ref 39.0–52.0)
HEMOGLOBIN: 11.3 g/dL — AB (ref 13.0–17.0)
MCH: 33.3 pg (ref 26.0–34.0)
MCHC: 32.4 g/dL (ref 30.0–36.0)
MCV: 102.9 fL — ABNORMAL HIGH (ref 78.0–100.0)
Platelets: 91 10*3/uL — ABNORMAL LOW (ref 150–400)
RBC: 3.39 MIL/uL — ABNORMAL LOW (ref 4.22–5.81)
RDW: 15.1 % (ref 11.5–15.5)
WBC: 5.1 10*3/uL (ref 4.0–10.5)

## 2015-11-22 LAB — COMPREHENSIVE METABOLIC PANEL
ALBUMIN: 2.5 g/dL — AB (ref 3.5–5.0)
ALK PHOS: 119 U/L (ref 38–126)
ALT: 18 U/L (ref 17–63)
ANION GAP: 6 (ref 5–15)
AST: 20 U/L (ref 15–41)
BILIRUBIN TOTAL: 0.7 mg/dL (ref 0.3–1.2)
BUN: 13 mg/dL (ref 6–20)
CALCIUM: 8.6 mg/dL — AB (ref 8.9–10.3)
CO2: 25 mmol/L (ref 22–32)
Chloride: 107 mmol/L (ref 101–111)
Creatinine, Ser: 0.67 mg/dL (ref 0.61–1.24)
GFR calc non Af Amer: >60 mL/min (ref >60)
GLUCOSE: 127 mg/dL — AB (ref 65–99)
POTASSIUM: 4.2 mmol/L (ref 3.5–5.1)
Sodium: 138 mmol/L (ref 135–145)
TOTAL PROTEIN: 6.3 g/dL — AB (ref 6.5–8.1)

## 2015-11-22 LAB — TYPE AND SCREEN
ABO/RH(D): O POS
ANTIBODY SCREEN: NEGATIVE

## 2015-11-22 LAB — PROTIME-INR
INR: 1.18 (ref 0.00–1.49)
PROTHROMBIN TIME: 15.1 s (ref 11.6–15.2)

## 2015-11-22 LAB — APTT: aPTT: 27 seconds (ref 24–37)

## 2015-11-22 LAB — GLUCOSE, CAPILLARY: GLUCOSE-CAPILLARY: 125 mg/dL — AB (ref 65–99)

## 2015-11-22 NOTE — Progress Notes (Signed)
PCP - Dr. Philip Aspen Cardiologist - Dr. Stanford Breed  EKG - 11/22/15 CXR - denies  Echo - 2006 Stress test - 2014 Cath - 2006  Patient denies chest pain and shortness of breath at PAT appointment.    Patient states that he has had another small boil to appear but he has a power and cream to place on boil.  Patient states that if boil gets larger or opens that he will make Dr. Trula Slade aware.

## 2015-11-22 NOTE — Progress Notes (Signed)
   11/22/15 1043  OBSTRUCTIVE SLEEP APNEA  Have you ever been diagnosed with sleep apnea through a sleep study? No  Do you snore loudly (loud enough to be heard through closed doors)?  0  Do you often feel tired, fatigued, or sleepy during the daytime (such as falling asleep during driving or talking to someone)? 0  Has anyone observed you stop breathing during your sleep? 0  Do you have, or are you being treated for high blood pressure? 1  BMI more than 35 kg/m2? 1  Age > 50 (1-yes) 1  Neck circumference greater than:Male 16 inches or larger, Male 17inches or larger? 1  Male Gender (Yes=1) 1  Obstructive Sleep Apnea Score 5

## 2015-11-22 NOTE — Progress Notes (Signed)
Patient unable to provide urine sample at PAT appointment.  Stephanie at Dr. Stephens Shire office made aware.  Will attempt to collect urine DOS.

## 2015-11-22 NOTE — Pre-Procedure Instructions (Signed)
[name]  11/22/2015      [id number] [place] [facility] [address] [place] [address]Phone: [phone]: [phone] [facility] [place] [address]Phone: [phone]: [phone] [place] [address] [place]Phone: [phone]: [phone]   Your procedure is scheduled on Tuesday, June 27th, 2017.  [facility]   Call this number if you have problems the morning of surgery:  [phone]   Remember:  Do not eat food or drink liquids after midnight.   Take these medicines the morning of surgery with A SIP OF WATER: Albuterol nebulizer if needed, Albuterol inhaler if needed (please bring inhaler with you), Aspirin, Duloxetine (Cymbalta), Flonase if needed, Metoprolol Succinate (Toprol-XL), Oxycodone (Roxicodone) if needed, Uloric.   Stop taking: Aleve, Naproxen, Ibuprofen, Advil, Motrin, BC's, Goody's, Fish oil, all herbal medications, and all vitamins.    WHAT DO I DO ABOUT MY DIABETES MEDICATION?  .Marland KitchenDo not take oral diabetes medicines (pills) the morning of surgery.  Do NOT take Metformin the morning of surgery.     Do not wear jewelry, make-up or nail polish.  Do not wear lotions, powders, or colognes.  You may NOT wear deoderant.  Men may shave face and neck.  Do not bring valuables to the hospital.   [facility] responsible for any belongings or val[facility]ables.  Contacts, dentures or bridgework may not be worn into surgery.  Leave your suitcase in the car.  After surgery it may be brought to your room.  For patients admitted to the hospital, discharge time will be determined by your treatment team.  Patients discharged the day of surgery will not be allowed to drive home.   Special  instructions:  Preparing for Surgery  Please read over the following fact sheets that you were given. Blood Transfusion Information and MRSA Information      How to Manage Your Diabetes Before and After Surgery  Why is it important to control my blood sugar before and after surgery? . Improving blood sugar levels before and after surgery helps healing and can limit problems. . A way of improving blood sugar control is eating a healthy diet by: o  Eating less sugar and carbohydrates o  Increasing activity/exercise o  Talking with your doctor about reaching your blood sugar goals . High blood sugars (greater than 180 mg/dL) can raise your risk of infections and slow your recovery, so you will need to focus on controlling your diabetes during the weeks before surgery. . Make sure that the doctor who takes care of your diabetes knows about your planned surgery including the date and location.  How do I manage my blood sugar before surgery? . Check your blood sugar at least 4 times a day, starting 2 days before surgery, to make sure that the level is not too high or low. o Check your blood sugar the morning of your surgery when you wake up and every 2 hours until you get to the Short Stay unit. . If your blood sugar is less than 70 mg/dL, you will need to treat for low blood sugar: o Do not take insulin. o Treat a low blood sugar (less than 70 mg/dL) with  cup of  clear juice (cranberry or apple), 4 glucose tablets, OR glucose gel. o Recheck blood sugar in 15 minutes after treatment (to make sure it is greater than 70 mg/dL). If your blood sugar is not greater than 70 mg/dL on recheck, call [phone] for further instructions. . Report your blood sugar to the short stay nurse when you get to Short Stay.  . If you are admitted to the hospital after surgery: o Your blood sugar will be checked by the staff and you will probably be given insulin after surgery (instead of oral diabetes  medicines) to make sure you have good blood sugar levels. o The goal for blood sugar control after surgery is 80-180 mg/dL.      [place]- Preparing For Surgery  Before surgery, you can play an important role. Because skin is not sterile, your skin needs to be as free of germs as possible. You can reduce the number of germs on your skin by washing with CHG (chlorahexidine gluconate) Soap before surgery.  CHG is an antiseptic cleaner which kills germs and bonds with the skin to continue killing germs even after washing.  Please do not use if you have an allergy to CHG or antibacterial soaps. If your skin becomes reddened/irritated stop using the CHG.  Do not shave (including legs and underarms) for at least 48 hours prior to first CHG shower. It is OK to shave your face.  Please follow these instructions carefully.   1. Shower the NIGHT BEFORE SURGERY and the MORNING OF SURGERY with CHG.   2. If you chose to wash your hair, wash your hair first as usual with your normal shampoo.  3. After you shampoo, rinse your hair and body thoroughly to remove the shampoo.  4. Use CHG as you would any other liquid soap. You can apply CHG directly to the skin and wash gently with a scrungie or a clean washcloth.   5. Apply the CHG Soap to your body ONLY FROM THE NECK DOWN.  Do not use on open wounds or open sores. Avoid contact with your eyes, ears, mouth and genitals (private parts). Wash genitals (private parts) with your normal soap.  6. Wash thoroughly, paying special attention to the area where your surgery will be performed.  7. Thoroughly rinse your body with warm water from the neck down.  8. DO NOT shower/wash with your normal soap after using and rinsing off the CHG Soap.  9. Pat yourself dry with a CLEAN TOWEL.   10. Wear CLEAN PAJAMAS   11. Place CLEAN SHEETS on your bed the night of your first shower and DO NOT SLEEP WITH PETS.    Day of Surgery: Do not apply any  deodorants/lotions. Please wear clean clothes to the hospital/surgery center.

## 2015-11-22 NOTE — Progress Notes (Signed)
Per Arbie Cookey at Dr Stephens Shire office, patient is to continue taking Aspirin, including the day of surgery.

## 2015-11-23 ENCOUNTER — Telehealth: Payer: Self-pay | Admitting: Family Medicine

## 2015-11-23 LAB — HEMOGLOBIN A1C
Hgb A1c MFr Bld: 5.5 % (ref 4.8–5.6)
MEAN PLASMA GLUCOSE: 111 mg/dL

## 2015-11-23 NOTE — Progress Notes (Addendum)
Anesthesia Chart Review: See anesthesia notes from 06/24/15 and update from 08/09/15. Patient was scheduled for FEVAR juxtarenal AAA on 07/02/15 by Dr. Trula Slade, but was postponed 06/30/15 due to patient developing a new right groin "boil" that was draining. He has known history of bilateral groin hidradenitis and was referred back to general surgery and placed on antibiotics. This was the third time surgery had been postponed due to hidradenitis suppurativa, and he actually underwent excision with Dr. Ninfa Linden on 12/31/14. Patient was seen in follow-up by Dr. Trula Slade on 11/08/15. The right groin was "essentially healed" at that time. He felt this was likely the best his groins were going to look and rescheduled procedure for 11/30/15.   At PAT yesterday, patient reported another small boil and would let Dr. Trula Slade know if it enlarged or opens. (On 11/23/15,I have sent Dr. Trula Slade a staff message regarding this and also his PLT count. On 11/05/15, I also notified [facility] RN Ulis Rias of reported small boil.)  PMH includes: CAD/inferior MI '05 s/p RCA/PDA stents, ischemic cardiomyopathy, HTN, DM, hyperlipidemia, cirrhosis due to NASH, hepatitis B, COPD, AAA, hidradenitis suppurativa s/p wide excision hidradenitis 12/31/14. Former smoker. BMI 48 consistent with morbid obesity. PCP is Dr. Ria Bush.  Patient originally had cardiac clearance from Dr. Stanford Breed in [place] note dated 09/01/14 and in office visit note from [place], [place] on 10/08/14. He denied CP and SOB at PAT.  11/22/15 EKG: NSR, possible inferior infarct (old). I think EKG appears stable when compared to 10/12/14 tracing.  10/23/12 Nuclear stress test: Moderate scar in the inferior wall Otherwise normal perfusion.LV Ejection Fraction: 39%. LV Wall Motion: Inferior/inferolateral hypokinesis.  03/30/05 Cardiac cath:  1. LV 111/16/20. EF 40% with inferior akinesis. 2. No aortic stenosis or mitral regurgitation. 3. Left main: Angiographically  normal. 4. LAD: Moderate sized vessel giving rise to one large diagonal. There is a 30% stenosis of the proximal vessel. 5. Circumflex: Moderate sized vessel giving rise to two obtuse marginals. It is angiographically normal. 6. RCA: Moderate sized dominant vessel. The stents in the proximal and mid vessel as well as distal vessel and PLV are widely patent. The jailed PDA that had been treated with balloon angioplasty remains widely patent as well.  09/23/14 Carotid U/S: < 40% BICA stenosis.  New labs (done 11/22/15) since my last note showed Cr 0.67. Glucose 127. A1c 5.5.  WBC 6.7. H/H 11.3/34.9. PLT count 91K (previously 77-92K since 06/23/15). PT/PTT WNL. T&S done. Patient was unable to provide a urine specimen, so he will need a UA on the day of surgery Colletta Maryland at [facility] notified).  If Dr. Trula Slade feels groin appearance is stable for surgery and there are otherwise no acute changes then I would anticipate that he could proceed as planned.  [facility] Short Stay Center/Anesthesiology Phone [phone] 11/23/2015 4:06 PM

## 2015-11-23 NOTE — Telephone Encounter (Signed)
Pt wife dropped off message regarding his Aorta surgery scheduled 6/27 and the med action plan. I placed msg and card on cart.

## 2015-11-29 MED ORDER — DEXTROSE 5 % IV SOLN
1.5000 g | INTRAVENOUS | Status: AC
  Administered 2015-11-30: 1.5 g via INTRAVENOUS
  Filled 2015-11-29: qty 1.5

## 2015-11-29 NOTE — Telephone Encounter (Signed)
Noted. Note by Dr Leone Payor Pain Management - rec: Continue fentanyl 44mg TD Q48 hrs #15, oxycodone 1109m#80, 1 month of oxycodone 2043m#150) around upcoming surgery

## 2015-11-30 ENCOUNTER — Encounter (HOSPITAL_COMMUNITY)
Admission: RE | Disposition: A | Discharge status: Home / Self Care | Payer: Self-pay | Source: Ambulatory Visit | Attending: Surgery

## 2015-11-30 ENCOUNTER — Encounter (HOSPITAL_COMMUNITY): Payer: Self-pay

## 2015-11-30 ENCOUNTER — Inpatient Hospital Stay (HOSPITAL_COMMUNITY): Payer: Medicare PPO | Admitting: Emergency Medicine

## 2015-11-30 ENCOUNTER — Inpatient Hospital Stay (HOSPITAL_COMMUNITY): Payer: Medicare PPO

## 2015-11-30 ENCOUNTER — Inpatient Hospital Stay (HOSPITAL_COMMUNITY): Payer: Medicare PPO | Admitting: Certified Registered Nurse Anesthetist

## 2015-11-30 ENCOUNTER — Inpatient Hospital Stay (HOSPITAL_COMMUNITY)
Admission: RE | Admit: 2015-11-30 | Discharge: 2015-12-02 | DRG: 269 | Disposition: A | Discharge status: Home / Self Care | Payer: Medicare PPO | Source: Ambulatory Visit | Attending: Surgery | Admitting: Surgery

## 2015-11-30 DIAGNOSIS — Z8661 Personal history of infections of the central nervous system: Secondary | ICD-10-CM

## 2015-11-30 DIAGNOSIS — E785 Hyperlipidemia, unspecified: Secondary | ICD-10-CM | POA: Diagnosis present

## 2015-11-30 DIAGNOSIS — I714 Abdominal aortic aneurysm, without rupture, unspecified: Secondary | ICD-10-CM | POA: Diagnosis present

## 2015-11-30 DIAGNOSIS — K7581 Nonalcoholic steatohepatitis (NASH): Secondary | ICD-10-CM | POA: Diagnosis present

## 2015-11-30 DIAGNOSIS — Z9889 Other specified postprocedural states: Secondary | ICD-10-CM | POA: Diagnosis not present

## 2015-11-30 DIAGNOSIS — Z87891 Personal history of nicotine dependence: Secondary | ICD-10-CM | POA: Diagnosis not present

## 2015-11-30 DIAGNOSIS — M109 Gout, unspecified: Secondary | ICD-10-CM | POA: Diagnosis present

## 2015-11-30 DIAGNOSIS — Z95828 Presence of other vascular implants and grafts: Secondary | ICD-10-CM | POA: Diagnosis not present

## 2015-11-30 DIAGNOSIS — G600 Hereditary motor and sensory neuropathy: Secondary | ICD-10-CM | POA: Diagnosis present

## 2015-11-30 DIAGNOSIS — K7460 Unspecified cirrhosis of liver: Secondary | ICD-10-CM | POA: Diagnosis not present

## 2015-11-30 DIAGNOSIS — G894 Chronic pain syndrome: Secondary | ICD-10-CM | POA: Diagnosis present

## 2015-11-30 DIAGNOSIS — I10 Essential (primary) hypertension: Secondary | ICD-10-CM | POA: Diagnosis not present

## 2015-11-30 DIAGNOSIS — I255 Ischemic cardiomyopathy: Secondary | ICD-10-CM | POA: Diagnosis not present

## 2015-11-30 DIAGNOSIS — I517 Cardiomegaly: Secondary | ICD-10-CM | POA: Diagnosis not present

## 2015-11-30 DIAGNOSIS — Z6841 Body Mass Index (BMI) 40.0 and over, adult: Secondary | ICD-10-CM

## 2015-11-30 DIAGNOSIS — I252 Old myocardial infarction: Secondary | ICD-10-CM | POA: Diagnosis not present

## 2015-11-30 DIAGNOSIS — K219 Gastro-esophageal reflux disease without esophagitis: Secondary | ICD-10-CM | POA: Diagnosis present

## 2015-11-30 DIAGNOSIS — I2510 Atherosclerotic heart disease of native coronary artery without angina pectoris: Secondary | ICD-10-CM | POA: Diagnosis present

## 2015-11-30 DIAGNOSIS — Z955 Presence of coronary angioplasty implant and graft: Secondary | ICD-10-CM

## 2015-11-30 DIAGNOSIS — J449 Chronic obstructive pulmonary disease, unspecified: Secondary | ICD-10-CM | POA: Diagnosis present

## 2015-11-30 DIAGNOSIS — E119 Type 2 diabetes mellitus without complications: Secondary | ICD-10-CM | POA: Diagnosis present

## 2015-11-30 HISTORY — PX: ABDOMINAL AORTIC ENDOVASCULAR FENESTRATED STENT GRAFT: SHX6430

## 2015-11-30 LAB — BASIC METABOLIC PANEL
ANION GAP: 5 (ref 5–15)
BUN: 10 mg/dL (ref 6–20)
CALCIUM: 8 mg/dL — AB (ref 8.9–10.3)
CO2: 24 mmol/L (ref 22–32)
Chloride: 106 mmol/L (ref 101–111)
Creatinine, Ser: 0.94 mg/dL (ref 0.61–1.24)
GFR calc Af Amer: >60 mL/min (ref >60)
GLUCOSE: 110 mg/dL — AB (ref 65–99)
Potassium: 3.8 mmol/L (ref 3.5–5.1)
Sodium: 135 mmol/L (ref 135–145)

## 2015-11-30 LAB — GLUCOSE, CAPILLARY
GLUCOSE-CAPILLARY: 107 mg/dL — AB (ref 65–99)
GLUCOSE-CAPILLARY: 111 mg/dL — AB (ref 65–99)
Glucose-Capillary: 108 mg/dL — ABNORMAL HIGH (ref 65–99)
Glucose-Capillary: 104 mg/dL — ABNORMAL HIGH (ref 65–99)

## 2015-11-30 LAB — CBC
HCT: 33.7 % — ABNORMAL LOW (ref 39.0–52.0)
Hemoglobin: 10.8 g/dL — ABNORMAL LOW (ref 13.0–17.0)
MCH: 32.5 pg (ref 26.0–34.0)
MCHC: 32 g/dL (ref 30.0–36.0)
MCV: 101.5 fL — AB (ref 78.0–100.0)
PLATELETS: 74 10*3/uL — AB (ref 150–400)
RBC: 3.32 MIL/uL — ABNORMAL LOW (ref 4.22–5.81)
RDW: 15.2 % (ref 11.5–15.5)
WBC: 7.2 10*3/uL (ref 4.0–10.5)

## 2015-11-30 LAB — POCT ACTIVATED CLOTTING TIME
ACTIVATED CLOTTING TIME: 235 s
ACTIVATED CLOTTING TIME: 224 s
ACTIVATED CLOTTING TIME: 241 s
Activated Clotting Time: 213 seconds

## 2015-11-30 LAB — PROTIME-INR
INR: 1.29 (ref 0.00–1.49)
PROTHROMBIN TIME: 16.2 s — AB (ref 11.6–15.2)

## 2015-11-30 LAB — MAGNESIUM: Magnesium: 1.6 mg/dL — ABNORMAL LOW (ref 1.7–2.4)

## 2015-11-30 LAB — APTT: aPTT: 36 seconds (ref 24–37)

## 2015-11-30 SURGERY — ABDOMINAL AORTIC ENDOVASCULAR FENESTRATED STENT GRAFT
Anesthesia: General | Site: Abdomen

## 2015-11-30 MED ORDER — METOPROLOL SUCCINATE ER 25 MG PO TB24
25.0000 mg | ORAL_TABLET | Freq: Every day | ORAL | Status: DC
  Administered 2015-12-01 – 2015-12-02 (×2): 25 mg via ORAL
  Filled 2015-11-30 (×2): qty 1

## 2015-11-30 MED ORDER — DULOXETINE HCL 60 MG PO CPEP
60.0000 mg | ORAL_CAPSULE | Freq: Every day | ORAL | Status: DC
  Administered 2015-12-01 – 2015-12-02 (×2): 60 mg via ORAL
  Filled 2015-11-30 (×2): qty 1

## 2015-11-30 MED ORDER — ROCURONIUM BROMIDE 100 MG/10ML IV SOLN
INTRAVENOUS | Status: DC | PRN
  Administered 2015-11-30: 20 mg via INTRAVENOUS
  Administered 2015-11-30: 10 mg via INTRAVENOUS
  Administered 2015-11-30: 50 mg via INTRAVENOUS
  Administered 2015-11-30: 20 mg via INTRAVENOUS
  Administered 2015-11-30 (×2): 10 mg via INTRAVENOUS
  Administered 2015-11-30 (×3): 20 mg via INTRAVENOUS

## 2015-11-30 MED ORDER — METOPROLOL TARTRATE 5 MG/5ML IV SOLN: 2.0000 mg | INTRAVENOUS | Status: DC | PRN

## 2015-11-30 MED ORDER — SODIUM CHLORIDE 0.9 % IV SOLN
INTRAVENOUS | Status: DC
  Administered 2015-11-30: 16:00:00 via INTRAVENOUS

## 2015-11-30 MED ORDER — EPHEDRINE 5 MG/ML INJ
INTRAVENOUS | Status: AC
  Filled 2015-11-30: qty 10

## 2015-11-30 MED ORDER — ROCURONIUM BROMIDE 50 MG/5ML IV SOLN
INTRAVENOUS | Status: AC
Start: 2015-11-30 — End: 2015-11-30
  Filled 2015-11-30: qty 1

## 2015-11-30 MED ORDER — ROCURONIUM BROMIDE 50 MG/5ML IV SOLN
INTRAVENOUS | Status: AC
  Filled 2015-11-30: qty 4

## 2015-11-30 MED ORDER — SODIUM CHLORIDE 0.9 % IV SOLN
INTRAVENOUS | Status: DC | PRN
  Administered 2015-11-30 (×2): 500 mL

## 2015-11-30 MED ORDER — INSULIN ASPART 100 UNIT/ML ~~LOC~~ SOLN
0.0000 [IU] | Freq: Three times a day (TID) | SUBCUTANEOUS | Status: DC
  Administered 2015-12-01: 2 [IU] via SUBCUTANEOUS

## 2015-11-30 MED ORDER — PANTOPRAZOLE SODIUM 40 MG PO TBEC
40.0000 mg | DELAYED_RELEASE_TABLET | Freq: Every day | ORAL | Status: DC
  Administered 2015-11-30 – 2015-12-02 (×3): 40 mg via ORAL
  Filled 2015-11-30 (×3): qty 1

## 2015-11-30 MED ORDER — MIDAZOLAM HCL 5 MG/5ML IJ SOLN
INTRAMUSCULAR | Status: DC | PRN
  Administered 2015-11-30: 2 mg via INTRAVENOUS

## 2015-11-30 MED ORDER — MORPHINE SULFATE (PF) 2 MG/ML IV SOLN
INTRAVENOUS | Status: AC
  Filled 2015-11-30: qty 1

## 2015-11-30 MED ORDER — PHENOL 1.4 % MT LIQD: 1.0000 | OROMUCOSAL | Status: DC | PRN

## 2015-11-30 MED ORDER — IODIXANOL 320 MG/ML IV SOLN
INTRAVENOUS | Status: DC | PRN
  Administered 2015-11-30: 1.4 mL via INTRAVENOUS
  Administered 2015-11-30: 150 mL via INTRAVENOUS

## 2015-11-30 MED ORDER — NITROGLYCERIN 0.4 MG SL SUBL: 0.4000 mg | SUBLINGUAL_TABLET | SUBLINGUAL | Status: DC | PRN

## 2015-11-30 MED ORDER — HYDRALAZINE HCL 20 MG/ML IJ SOLN
5.0000 mg | INTRAMUSCULAR | Status: DC | PRN
Start: 2015-11-30 — End: 2015-12-02

## 2015-11-30 MED ORDER — MORPHINE SULFATE (PF) 2 MG/ML IV SOLN
2.0000 mg | INTRAVENOUS | Status: DC | PRN
  Administered 2015-11-30 – 2015-12-01 (×3): 2 mg via INTRAVENOUS
  Administered 2015-12-02: 4 mg via INTRAVENOUS
  Filled 2015-11-30: qty 1
  Filled 2015-11-30: qty 2
  Filled 2015-11-30: qty 1
  Filled 2015-11-30: qty 2

## 2015-11-30 MED ORDER — PROPOFOL 10 MG/ML IV BOLUS
INTRAVENOUS | Status: AC
  Filled 2015-11-30: qty 20

## 2015-11-30 MED ORDER — GUAIFENESIN-DM 100-10 MG/5ML PO SYRP: 15.0000 mL | ORAL_SOLUTION | ORAL | Status: DC | PRN

## 2015-11-30 MED ORDER — DEXTROSE 5 % IV SOLN
750.0000 mg | Freq: Once | INTRAVENOUS | Status: AC
  Administered 2015-11-30: .75 g via INTRAVENOUS
  Filled 2015-11-30: qty 750

## 2015-11-30 MED ORDER — ASPIRIN 325 MG PO TABS
325.0000 mg | ORAL_TABLET | Freq: Every day | ORAL | Status: DC
  Administered 2015-12-01 – 2015-12-02 (×2): 325 mg via ORAL
  Filled 2015-11-30 (×2): qty 1

## 2015-11-30 MED ORDER — FENTANYL CITRATE (PF) 250 MCG/5ML IJ SOLN
INTRAMUSCULAR | Status: AC
Start: 2015-11-30 — End: 2015-11-30
  Filled 2015-11-30: qty 5

## 2015-11-30 MED ORDER — LABETALOL HCL 5 MG/ML IV SOLN: 10.0000 mg | INTRAVENOUS | Status: DC | PRN

## 2015-11-30 MED ORDER — FENTANYL CITRATE (PF) 100 MCG/2ML IJ SOLN
INTRAMUSCULAR | Status: DC | PRN
  Administered 2015-11-30: 100 ug via INTRAVENOUS
  Administered 2015-11-30 (×2): 50 ug via INTRAVENOUS

## 2015-11-30 MED ORDER — LABETALOL HCL 5 MG/ML IV SOLN
INTRAVENOUS | Status: DC | PRN
  Administered 2015-11-30 (×2): 10 mg via INTRAVENOUS

## 2015-11-30 MED ORDER — FENTANYL 25 MCG/HR TD PT72
50.0000 ug | MEDICATED_PATCH | TRANSDERMAL | Status: DC
  Administered 2015-11-30: 50 ug via TRANSDERMAL
  Filled 2015-11-30: qty 2

## 2015-11-30 MED ORDER — ACETAMINOPHEN 325 MG RE SUPP: 325.0000 mg | RECTAL | Status: DC | PRN

## 2015-11-30 MED ORDER — ESMOLOL HCL 100 MG/10ML IV SOLN
INTRAVENOUS | Status: AC
  Filled 2015-11-30: qty 10

## 2015-11-30 MED ORDER — PROPOFOL 10 MG/ML IV BOLUS
INTRAVENOUS | Status: DC | PRN
  Administered 2015-11-30: 180 mg via INTRAVENOUS

## 2015-11-30 MED ORDER — OXYCODONE HCL 5 MG PO TABS
20.0000 mg | ORAL_TABLET | Freq: Four times a day (QID) | ORAL | Status: DC | PRN
  Administered 2015-12-01 – 2015-12-02 (×3): 20 mg via ORAL
  Filled 2015-11-30 (×5): qty 4

## 2015-11-30 MED ORDER — POLYETHYLENE GLYCOL 3350 17 G PO PACK: 17.0000 g | PACK | Freq: Every day | ORAL | Status: DC | PRN

## 2015-11-30 MED ORDER — MAGNESIUM SULFATE 2 GM/50ML IV SOLN: 2.0000 g | Freq: Every day | INTRAVENOUS | Status: DC | PRN

## 2015-11-30 MED ORDER — POTASSIUM CHLORIDE CRYS ER 20 MEQ PO TBCR: 20.0000 meq | EXTENDED_RELEASE_TABLET | Freq: Every day | ORAL | Status: DC | PRN

## 2015-11-30 MED ORDER — VITAMIN D 1000 UNITS PO TABS
1000.0000 [IU] | ORAL_TABLET | Freq: Every day | ORAL | Status: DC
  Administered 2015-12-01 – 2015-12-02 (×2): 1000 [IU] via ORAL
  Filled 2015-11-30 (×2): qty 1

## 2015-11-30 MED ORDER — ONDANSETRON HCL 4 MG/2ML IJ SOLN
INTRAMUSCULAR | Status: AC
  Filled 2015-11-30: qty 2

## 2015-11-30 MED ORDER — SUCCINYLCHOLINE CHLORIDE 200 MG/10ML IV SOSY
PREFILLED_SYRINGE | INTRAVENOUS | Status: AC
  Filled 2015-11-30: qty 10

## 2015-11-30 MED ORDER — ONDANSETRON HCL 4 MG/2ML IJ SOLN
INTRAMUSCULAR | Status: DC | PRN
  Administered 2015-11-30: 4 mg via INTRAVENOUS

## 2015-11-30 MED ORDER — BISACODYL 5 MG PO TBEC: 5.0000 mg | DELAYED_RELEASE_TABLET | Freq: Every day | ORAL | Status: DC | PRN

## 2015-11-30 MED ORDER — SODIUM CHLORIDE 0.9 % IV SOLN: 500.0000 mL | Freq: Once | INTRAVENOUS | Status: DC | PRN

## 2015-11-30 MED ORDER — CETYLPYRIDINIUM CHLORIDE 0.05 % MT LIQD
7.0000 mL | Freq: Two times a day (BID) | OROMUCOSAL | Status: DC
  Administered 2015-11-30 – 2015-12-02 (×4): 7 mL via OROMUCOSAL

## 2015-11-30 MED ORDER — IRBESARTAN 75 MG PO TABS
37.5000 mg | ORAL_TABLET | Freq: Every day | ORAL | Status: DC
  Administered 2015-12-01 – 2015-12-02 (×2): 37.5 mg via ORAL
  Filled 2015-11-30 (×2): qty 0.5

## 2015-11-30 MED ORDER — DEXTROSE 5 % IV SOLN
1.5000 g | Freq: Two times a day (BID) | INTRAVENOUS | Status: AC
  Administered 2015-11-30 – 2015-12-01 (×2): 1.5 g via INTRAVENOUS
  Filled 2015-11-30 (×2): qty 1.5

## 2015-11-30 MED ORDER — SUCCINYLCHOLINE CHLORIDE 200 MG/10ML IV SOSY
PREFILLED_SYRINGE | INTRAVENOUS | Status: DC | PRN
  Administered 2015-11-30: 120 mg via INTRAVENOUS

## 2015-11-30 MED ORDER — PHENYLEPHRINE HCL 10 MG/ML IJ SOLN
10.0000 mg | INTRAVENOUS | Status: DC | PRN
  Administered 2015-11-30: 40 ug/min via INTRAVENOUS

## 2015-11-30 MED ORDER — LIDOCAINE HCL (CARDIAC) 20 MG/ML IV SOLN
INTRAVENOUS | Status: DC | PRN
  Administered 2015-11-30: 70 mg via INTRAVENOUS

## 2015-11-30 MED ORDER — DOCUSATE SODIUM 100 MG PO CAPS
100.0000 mg | ORAL_CAPSULE | Freq: Every day | ORAL | Status: DC
  Administered 2015-12-01 – 2015-12-02 (×2): 100 mg via ORAL
  Filled 2015-11-30 (×2): qty 1

## 2015-11-30 MED ORDER — ACETAMINOPHEN 325 MG PO TABS
325.0000 mg | ORAL_TABLET | ORAL | Status: DC | PRN
  Administered 2015-12-01: 650 mg via ORAL
  Filled 2015-11-30: qty 2

## 2015-11-30 MED ORDER — LACTATED RINGERS IV SOLN
INTRAVENOUS | Status: DC | PRN
  Administered 2015-11-30 (×3): via INTRAVENOUS

## 2015-11-30 MED ORDER — 0.9 % SODIUM CHLORIDE (POUR BTL) OPTIME
TOPICAL | Status: DC | PRN
  Administered 2015-11-30: 1000 mL

## 2015-11-30 MED ORDER — METOPROLOL TARTRATE 5 MG/5ML IV SOLN
INTRAVENOUS | Status: AC
  Filled 2015-11-30: qty 5

## 2015-11-30 MED ORDER — ENOXAPARIN SODIUM 40 MG/0.4ML ~~LOC~~ SOLN
40.0000 mg | SUBCUTANEOUS | Status: DC
  Administered 2015-12-01: 40 mg via SUBCUTANEOUS
  Filled 2015-11-30: qty 0.4

## 2015-11-30 MED ORDER — MIDAZOLAM HCL 2 MG/2ML IJ SOLN
INTRAMUSCULAR | Status: AC
  Filled 2015-11-30: qty 2

## 2015-11-30 MED ORDER — PROTAMINE SULFATE 10 MG/ML IV SOLN
INTRAVENOUS | Status: DC | PRN
  Administered 2015-11-30: 20 mg via INTRAVENOUS
  Administered 2015-11-30: 10 mg via INTRAVENOUS
  Administered 2015-11-30: 20 mg via INTRAVENOUS

## 2015-11-30 MED ORDER — HEPARIN SODIUM (PORCINE) 1000 UNIT/ML IJ SOLN
INTRAMUSCULAR | Status: DC | PRN
  Administered 2015-11-30: 1 mL via INTRAVENOUS
  Administered 2015-11-30: 2 mL via INTRAVENOUS
  Administered 2015-11-30: 13 mL via INTRAVENOUS
  Administered 2015-11-30: 2 mL via INTRAVENOUS

## 2015-11-30 MED ORDER — ONDANSETRON HCL 4 MG/2ML IJ SOLN
4.0000 mg | Freq: Four times a day (QID) | INTRAMUSCULAR | Status: DC | PRN
  Administered 2015-12-01: 4 mg via INTRAVENOUS
  Filled 2015-11-30: qty 2

## 2015-11-30 MED ORDER — ALUM & MAG HYDROXIDE-SIMETH 200-200-20 MG/5ML PO SUSP
15.0000 mL | ORAL | Status: DC | PRN
Start: 2015-11-30 — End: 2015-12-02

## 2015-11-30 MED ORDER — FLUTICASONE PROPIONATE 50 MCG/ACT NA SUSP: 2.0000 | Freq: Every day | NASAL | Status: DC | PRN

## 2015-11-30 MED ORDER — SENNOSIDES-DOCUSATE SODIUM 8.6-50 MG PO TABS: 1.0000 | ORAL_TABLET | Freq: Every evening | ORAL | Status: DC | PRN

## 2015-11-30 MED ORDER — LACTATED RINGERS IV SOLN
INTRAVENOUS | Status: DC | PRN
  Administered 2015-11-30 (×2): via INTRAVENOUS

## 2015-11-30 MED ORDER — ALBUTEROL SULFATE (2.5 MG/3ML) 0.083% IN NEBU: 2.5000 mg | INHALATION_SOLUTION | RESPIRATORY_TRACT | Status: DC | PRN

## 2015-11-30 MED ORDER — ALBUTEROL SULFATE HFA 108 (90 BASE) MCG/ACT IN AERS: 2.0000 | INHALATION_SPRAY | Freq: Four times a day (QID) | RESPIRATORY_TRACT | Status: DC | PRN

## 2015-11-30 MED ORDER — SUGAMMADEX SODIUM 200 MG/2ML IV SOLN
INTRAVENOUS | Status: DC | PRN
  Administered 2015-11-30: 200 mg via INTRAVENOUS

## 2015-11-30 SURGICAL SUPPLY — 85 items
BAG DECANTER FOR FLEXI CONT (MISCELLANEOUS) IMPLANT
BAG SNAP BAND KOVER 36X36 (MISCELLANEOUS) ×4 IMPLANT
BALLN CODA OCL 2-9.0-35-120-3 (BALLOONS) ×2
BALLOON COD OCL 2-9.0-35-120-3 (BALLOONS) ×1 IMPLANT
CANISTER SUCTION 2500CC (MISCELLANEOUS) ×2 IMPLANT
CATH ACCU-VU SIZ PIG 5F 100CM (CATHETERS) ×2 IMPLANT
CATH ANGIO 5F BER 65CM (CATHETERS) ×2 IMPLANT
CATH ANGIO 5F BER2 65CM (CATHETERS) ×2 IMPLANT
CATH BALLN ADVANCE 35LP (CATHETERS) ×2 IMPLANT
CATH OMNI FLUSH .035X70CM (CATHETERS) ×2 IMPLANT
CATH QUICKCROSS SUPP .035X90CM (MICROCATHETER) ×2 IMPLANT
COVER DOME SNAP 22 D (MISCELLANEOUS) ×2 IMPLANT
COVER PROBE W GEL 5X96 (DRAPES) ×2 IMPLANT
COVER SURGICAL LIGHT HANDLE (MISCELLANEOUS) ×2 IMPLANT
DEVICE CLOSURE PERCLS PRGLD 6F (VASCULAR PRODUCTS) ×5 IMPLANT
DEVICE TORQUE H2O (MISCELLANEOUS) ×2 IMPLANT
DRAIN CHANNEL 10F 3/8 F FF (DRAIN) IMPLANT
DRAIN CHANNEL 10M FLAT 3/4 FLT (DRAIN) IMPLANT
DRAPE ZERO GRAVITY STERILE (DRAPES) ×2 IMPLANT
DRSG TEGADERM 2-3/8X2-3/4 SM (GAUZE/BANDAGES/DRESSINGS) ×4 IMPLANT
DRYSEAL FLEXSHEATH 18FR 33CM (SHEATH) ×1
ELECT CAUTERY BLADE 6.4 (BLADE) ×2 IMPLANT
ELECT REM PT RETURN 9FT ADLT (ELECTROSURGICAL) ×4
ELECTRODE REM PT RTRN 9FT ADLT (ELECTROSURGICAL) ×2 IMPLANT
EVACUATOR 3/16  PVC DRAIN (DRAIN)
EVACUATOR 3/16 PVC DRAIN (DRAIN) IMPLANT
EVACUATOR SILICONE 100CC (DRAIN) IMPLANT
GAUZE SPONGE 2X2 8PLY STRL LF (GAUZE/BANDAGES/DRESSINGS) ×2 IMPLANT
GLOVE BIO SURGEON STRL SZ7.5 (GLOVE) ×2 IMPLANT
GLOVE BIOGEL PI IND STRL 6.5 (GLOVE) ×1 IMPLANT
GLOVE BIOGEL PI IND STRL 7.5 (GLOVE) ×2 IMPLANT
GLOVE BIOGEL PI INDICATOR 6.5 (GLOVE) ×1
GLOVE BIOGEL PI INDICATOR 7.5 (GLOVE) ×2
GLOVE ECLIPSE 7.5 STRL STRAW (GLOVE) ×2 IMPLANT
GLOVE SURG SS PI 7.0 STRL IVOR (GLOVE) ×2 IMPLANT
GLOVE SURG SS PI 7.5 STRL IVOR (GLOVE) ×4 IMPLANT
GOWN STRL REUS W/ TWL LRG LVL3 (GOWN DISPOSABLE) ×1 IMPLANT
GOWN STRL REUS W/ TWL XL LVL3 (GOWN DISPOSABLE) ×2 IMPLANT
GOWN STRL REUS W/TWL LRG LVL3 (GOWN DISPOSABLE) ×1
GOWN STRL REUS W/TWL XL LVL3 (GOWN DISPOSABLE) ×2
GRAFT BALLN CATH 65CM (STENTS) IMPLANT
GRAFT FEN DIST EVAR 16X62X76 (Graft) ×2 IMPLANT
GRAFT FEN PROX EVAR 28X124M (Graft) ×2 IMPLANT
GRAFT LEG ILIAC ZSLE-16-56-ZT (Endovascular Graft) ×2 IMPLANT
GUIDEWIRE ANGLED .035X260CM (WIRE) ×2 IMPLANT
HEMOSTAT SNOW SURGICEL 2X4 (HEMOSTASIS) IMPLANT
KIT BASIN OR (CUSTOM PROCEDURE TRAY) ×2 IMPLANT
KIT ENCORE 26 ADVANTAGE (KITS) ×2 IMPLANT
KIT ROOM TURNOVER OR (KITS) ×2 IMPLANT
LIQUID BAND (GAUZE/BANDAGES/DRESSINGS) ×4 IMPLANT
NEEDLE PERC 18GX7CM (NEEDLE) ×4 IMPLANT
NS IRRIG 1000ML POUR BTL (IV SOLUTION) ×2 IMPLANT
PACK ENDOVASCULAR (PACKS) ×2 IMPLANT
PAD ARMBOARD 7.5X6 YLW CONV (MISCELLANEOUS) ×4 IMPLANT
PENCIL BUTTON HOLSTER BLD 10FT (ELECTRODE) ×2 IMPLANT
PERCLOSE PROGLIDE 6F (VASCULAR PRODUCTS) ×10
SHEATH AVANTI 11CM 8FR (MISCELLANEOUS) IMPLANT
SHEATH BRITE TIP 8FR 35CM (SHEATH) ×2 IMPLANT
SHEATH DRYSEAL FLEX 18FR 33CM (SHEATH) ×1 IMPLANT
SHEATH HIGHFLEX ANSEL 7FR 55CM (SHEATH) ×4 IMPLANT
SHIELD RADPAD SCOOP 12X17 (MISCELLANEOUS) ×6 IMPLANT
SPONGE GAUZE 2X2 STER 10/PKG (GAUZE/BANDAGES/DRESSINGS) ×2
SPONGE GAUZE 4X4 12PLY STER LF (GAUZE/BANDAGES/DRESSINGS) ×4 IMPLANT
STENT GRAFT BALLN CATH 65CM (STENTS)
STENT VIABAHNBX 6X29X135 (Permanent Stent) ×2 IMPLANT
STENT VIABAHNBX 7X29X135 (Permanent Stent) ×2 IMPLANT
STOPCOCK MORSE 400PSI 3WAY (MISCELLANEOUS) ×4 IMPLANT
SUT ETHILON 3 0 PS 1 (SUTURE) IMPLANT
SUT PROLENE 5 0 C 1 24 (SUTURE) IMPLANT
SUT SILK 2 0 FS (SUTURE) ×2 IMPLANT
SUT VIC AB 2-0 CT1 27 (SUTURE)
SUT VIC AB 2-0 CT1 TAPERPNT 27 (SUTURE) IMPLANT
SUT VIC AB 3-0 SH 27 (SUTURE)
SUT VIC AB 3-0 SH 27X BRD (SUTURE) IMPLANT
SUT VICRYL 4-0 PS2 18IN ABS (SUTURE) ×4 IMPLANT
SYR 30ML LL (SYRINGE) ×2 IMPLANT
SYRINGE 10CC LL (SYRINGE) ×2 IMPLANT
TAPE CLOTH SURG 6X10 WHT LF (GAUZE/BANDAGES/DRESSINGS) ×4 IMPLANT
TOWEL OR 17X26 10 PK STRL BLUE (TOWEL DISPOSABLE) ×2 IMPLANT
TRAY FOLEY W/METER SILVER 16FR (SET/KITS/TRAYS/PACK) ×2 IMPLANT
TUBING HIGH PRESSURE 120CM (CONNECTOR) ×4 IMPLANT
WIRE AMPLATZ SS-J .035X180CM (WIRE) IMPLANT
WIRE BENTSON .035X145CM (WIRE) ×6 IMPLANT
WIRE ROSEN-J .035X260CM (WIRE) ×4 IMPLANT
WIRE STIFF LUNDERQUIST 260MM (WIRE) ×4 IMPLANT

## 2015-11-30 NOTE — Transfer of Care (Signed)
Immediate Anesthesia Transfer of Care Note  Patient: [name]  Procedure(s) Performed: Procedure(s): ABDOMINAL AORTIC ENDOVASCULAR FENESTRATED STENT GRAFT (N/A)  Patient Location: PACU  Anesthesia Type:General  Level of Consciousness: awake, alert , oriented and patient cooperative  Airway & Oxygen Therapy: Patient Spontanous Breathing and Patient connected to face mask oxygen  Post-op Assessment: Report given to RN, Post -op Vital signs reviewed and stable and Patient moving all extremities X 4  Post vital signs: Reviewed and stable  Last Vitals:  Filed Vitals:   11/30/15 0632  BP: 97/58  Pulse: 80  Temp: 37 C  Resp: 18    Last Pain: There were no vitals filed for this visit.       Complications: No apparent anesthesia complications

## 2015-11-30 NOTE — Interval H&P Note (Signed)
History and Physical Interval Note:  11/30/2015 7:23 AM  [name]  has presented today for surgery, with the diagnosis of Juxtarenal abdominal aortic aneurysm I71.4  The various methods of treatment have been discussed with the patient and family. After consideration of risks, benefits and other options for treatment, the patient has consented to  Procedure(s): ABDOMINAL AORTIC ENDOVASCULAR FENESTRATED STENT GRAFT (N/A) as a surgical intervention .  The patient's history has been reviewed, patient examined, no change in status, stable for surgery.  I have reviewed the patient's chart and labs.  Questions were answered to the patient's satisfaction.     [name]

## 2015-11-30 NOTE — Anesthesia Postprocedure Evaluation (Signed)
Anesthesia Post Note  Patient: [name]  Procedure(s) Performed: Procedure(s) (LRB): ABDOMINAL AORTIC ENDOVASCULAR FENESTRATED STENT GRAFT (N/A)  Patient location during evaluation: PACU Anesthesia Type: General Level of consciousness: awake and alert Pain management: pain level controlled Vital Signs Assessment: post-procedure vital signs reviewed and stable Respiratory status: spontaneous breathing, nonlabored ventilation, respiratory function stable and patient connected to nasal cannula oxygen Cardiovascular status: blood pressure returned to baseline and stable Postop Assessment: no signs of nausea or vomiting Anesthetic complications: no    Last Vitals:  Filed Vitals:   11/30/15 1502 11/30/15 1517  BP: 116/67 111/65  Pulse: 92 93  Temp:    Resp: 14 13    Last Pain: There were no vitals filed for this visit.               Dalya Maselli,JAMES TERRILL

## 2015-11-30 NOTE — Progress Notes (Addendum)
      Patient comfortable, groins soft, abdomin soft  Palpable DP 3+ bilaterally  S/P EVAR with fenestrated graft  COLLINS, EMMA MAUREEN PA-C  Agree.  Resting comfortably, no complaints, still sedated Doppler Pedal signals calfs soft Abdomen soft and pain free   Wells Brabham

## 2015-11-30 NOTE — H&P (View-Only) (Signed)
Vascular and Vein Specialist of [facility]  Patient name: [name] MRN: [id number] DOB: Oct 23, 1955 Sex: male  REASON FOR VISIT: AAA  HPI: The patient is here today for a wound check. He has previously undergone resection of bilateral inguinal hidradenitis by Dr. Rush Farmer. We are awaiting his incisions to heal so that we can proceed with fenestrated endovascular aneurysm repair. He is doing everything he can at home to keep these areas dry.  He does not have any fevers or chills.  He recently saw a dermatologist who said this is the best the wound has looked in a long time.  He denies having any abdominal pain.  Past Medical History  Diagnosis Date  . Ischemic cardiomyopathy     s/p inferior MI  --  current ef per myoview 39%  . T2DM (type 2 diabetes mellitus) ([place])     ABIs WNL 2016  . Disturbances of sensation of smell and taste   . Gout   . Hidradenitis suppurativa dx 2011   goin and leg crease    followd by Lyndle Herrlich - daily bactrim, s/p intralesional steroid injection 10/2010  . HLD (hyperlipidemia)   . HTN (hypertension)   . Obesity   . Vitamin D deficiency   . Allergic rhinitis   . Lumbar herniated disc   . Spinal stenosis     released from [facility].  established with [facility] (07/2013)  . History of hepatitis B 1983  . History of viral meningitis 2000  . Chronic pain syndrome     established with [facility] ([place]) --> disagreement and transfered care to Dr Sanjuan Dame at [facility] pain clinic [facility], requests PCP write Rx but f/u with pain clinic Q6-12 months  . Liver cirrhosis secondary to NASH 01/2014    by CT scan, rec virtual colonoscopy by Dr Collene Mares 06/2014  . Nocturia more than twice per night   . CAD (coronary artery disease) cardiologist-  dr Stanford Breed    x3 with stents last 2005, EF 40%, predominantly RCA by CT 2016  . History of MI (myocardial infarction)     2000  &  2005  . AAA (abdominal aortic aneurysm)  ([place]) 09/2012--  monitored by dr Trula Slade    stable 5.6cm CTA abdomen 2016  . COPD (chronic obstructive pulmonary disease) ([place]) 10/2011    minimal by PFTs  . Dyspnea on exertion   . Cervical spondylosis 05/2010    s/p surgery  . DDD (degenerative disc disease)   . Hip osteoarthritis     s/p intraarticular steroid shot (12/2012) ([place])  . B12 deficiency   . Hidradenitis     right groin  . Myocardial infarction ([place])   . GERD (gastroesophageal reflux disease)   . Hepatitis     hepatitis B  . Charcot Marie Tooth muscular atrophy dx  [phone]    neurologist--  dr love--  type 2 per pt  . Headache     Family History  Problem Relation Age of Onset  . Cancer Mother     colon  . Diabetes Mother   . Kidney disease Mother   . Aneurysm Mother     AAA  . Rheum arthritis Mother   . Charcot-Marie-Tooth disease Mother   . Heart disease Mother     before age 42  . Cancer Father     skin  . Heart attack Father   . Heart disease Father     before age 52  . Cancer Brother  skin  . Coronary artery disease Brother   . Cancer Brother     small cell lung cancer  . Aneurysm Brother     AAA  . Rheum arthritis Sister   . Rheum arthritis Brother     SOCIAL HISTORY: Social History  Substance Use Topics  . Smoking status: Former Smoker -- 0.75 packs/day for 32 years    Types: Cigarettes    Start date: 06/06/1967    Quit date: 05/06/2015  . Smokeless tobacco: Never Used     Comment: stopped smoking a pipe in 2015  DOES SMOKE E CIG  . Alcohol Use: No    Allergies  Allergen Reactions  . Statins Shortness Of Breath    Cough, trouble breathing  . Losartan Other (See Comments)    Causes him to have pain  . Allopurinol Nausea Only  . Baclofen Nausea And Vomiting  . Gabapentin Nausea And Vomiting    Current Outpatient Prescriptions  Medication Sig Dispense Refill  . albuterol (PROVENTIL HFA;VENTOLIN HFA) 108 (90 Base) MCG/ACT inhaler Inhale 2 puffs into the lungs every 6  (six) hours as needed for wheezing or shortness of breath. 1 Inhaler 0  . albuterol (PROVENTIL) (2.5 MG/3ML) 0.083% nebulizer solution USE 1 VIAL PER NEBULIZER EVERY 6 HRS AS NEEDED FOR WHEEZING 75 mL 6  . aspirin 325 MG tablet Take 1 tablet (325 mg total) by mouth daily. 90 tablet 3  . cholecalciferol (VITAMIN D) 1000 units tablet Take 1,000 Units by mouth daily. Reported on 08/02/2015    . Cholecalciferol (VITAMIN D3) 50000 units TABS Take 1 tablet by mouth once a week. 12 tablet 1  . clindamycin (CLEOCIN-T) 1 % external solution Apply topically 2 (two) times daily. 30 mL 2  . DULoxetine (CYMBALTA) 60 MG capsule TAKE ONE CAPSULE BY MOUTH DAILY 30 capsule 11  . fentaNYL (DURAGESIC - DOSED MCG/HR) 50 MCG/HR Place 1 patch (50 mcg total) onto the skin every other day. 15 patch 0  . fluticasone (FLONASE) 50 MCG/ACT nasal spray Place 2 sprays into both nostrils daily as needed for allergies.     . folic acid (FOLVITE) 1 MG tablet TAKE 1 TABLET BY MOUTH DAILY 90 tablet 0  . glucose blood test strip 1 each by Other route as needed for other. Use as instructed to check sugar once daily and as needed. **ONE TOUCH ULTRA** Dx: E11.9 100 each 3  . irbesartan (AVAPRO) 75 MG tablet Take 0.5 tablets (37.5 mg total) by mouth daily. 45 tablet 6  . metFORMIN (GLUCOPHAGE) 500 MG tablet TAKE 1/2 TABLET BY MOUTH EVERY NIGHT AT BEDTIME. 30 tablet 6  . metoprolol succinate (TOPROL-XL) 25 MG 24 hr tablet TAKE 1/2 TABLET EVERY DAY 45 tablet 6  . Misc Natural Products (TART CHERRY ADVANCED) CAPS Take 1 capsule by mouth 2 (two) times daily.    . nitroGLYCERIN (NITROSTAT) 0.4 MG SL tablet Place 1 tablet (0.4 mg total) under the tongue every 5 (five) minutes as needed for chest pain. 25 tablet 12  . oxyCODONE (ROXICODONE) 15 MG immediate release tablet Take 1 tablet (15 mg total) by mouth every 4 (four) hours as needed. 180 tablet 0  . polyethylene glycol powder (GLYCOLAX/MIRALAX) powder Take 17 g by mouth daily as needed for  moderate constipation. 3350 g 0  . ULORIC 40 MG tablet TAKE 1 TABLET (40 MG TOTAL) BY MOUTH DAILY. ALLOPURINOL INTOLERANT 90 tablet 2  . vitamin B-12 (CYANOCOBALAMIN) 500 MCG tablet Take 500 mcg by mouth daily.    [name]  azithromycin (ZITHROMAX) 250 MG tablet Take 2 tabs today, then 1 tab daily x 4 days (Patient not taking: Reported on 11/08/2015) 6 tablet 0  . baclofen (LIORESAL) 10 MG tablet Take 10 mg by mouth 2 (two) times daily. Reported on 11/08/2015     No current facility-administered medications for this visit.    REVIEW OF SYSTEMS:  [X]  denotes positive finding, [ ]  denotes negative finding Cardiac  Comments:  Chest pain or chest pressure:    Shortness of breath upon exertion: x   Short of breath when lying flat: x   Irregular heart rhythm:        Vascular    Pain in calf, thigh, or hip brought on by ambulation: x   Pain in feet at night that wakes you up from your sleep:  x   Blood clot in your veins:    Leg swelling:         Pulmonary    Oxygen at home:    Productive cough:     Wheezing:  x       Neurologic    Sudden weakness in arms or legs:  x   Sudden numbness in arms or legs:  x   Sudden onset of difficulty speaking or slurred speech:    Temporary loss of vision in one eye:     Problems with dizziness:  x       Gastrointestinal    Blood in stool:     Vomited blood:         Genitourinary    Burning when urinating:     Blood in urine:        Psychiatric    Major depression:         Hematologic    Bleeding problems: x   Problems with blood clotting too easily:        Skin    Rashes or ulcers: x       Constitutional    Fever or chills:      PHYSICAL EXAM: Filed Vitals:   11/08/15 1336  BP: 116/80  Pulse: 86  Temp: 97.8 F (36.6 C)  TempSrc: Oral  Resp: 18  Height: 5' 7"  (1.702 m)  Weight: 301 lb 11.2 oz (136.85 kg)  SpO2: 94%    GENERAL: The patient is a well-nourished male, in no acute distress. The vital signs are documented above. CARDIAC:  There is a regular rate and rhythm.  VASCULAR: Palpable femoral pulses PULMONARY: There is good air exchange bilaterally without wheezing or rales. ABDOMEN: Soft and non-tender with normal pitched bowel sounds.  MUSCULOSKELETAL: There are no major deformities or cyanosis. NEUROLOGIC: No focal weakness or paresthesias are detected. SKIN: The right groin is essentially healed PSYCHIATRIC: The patient has a normal affect.  DATA:  None  MEDICAL ISSUES: I feel this is the best the patient's groins are going to look and we should proceed with endovascular repair of his aneurysm.  I'm repeating his CT scan to make sure the device that we ordered last year.  Fit and exclude his aneurysm.  I have placed him on the schedule for Tuesday, June 27.  We discussed the risks and benefits of the procedure again in detail.  All his questions were answered    [name], MD Vascular and Vein Specialists of [facility] [phone] Pager [phone]

## 2015-11-30 NOTE — Op Note (Signed)
Procedure: Fenestrated Cook stent graft repair of abdominal aortic aneurysm he  Preoperative diagnosis: Abdominal aortic aneurysm. Postoperative diagnosis: Same  Anesthesia: Gen.  Co-surgeon: [name] M.D.  Operative details: After obtaining informed consent, the patient was taken to the operative area. The patient was placed in supine position operating table. After induction of general anesthesia and endotracheal intubation, the patient was prepped and draped in usual sterile fashion from the umbilicus down the knees. I used ultrasound to obtain access through the left common femoral artery. An 71 Bentson wire was advanced up into the abdominal aorta. 2 Proglide devices were then brought up in the operative field. A dilator was then used to dilate the tract and then each Proglide device was fired in usual fashion in the 10:00 and 2:00 position. Full details of the operative procedure RN Dr. Stephens Shire operative note. I was present and participated in the full extent of the operation. Specific details I cannulated and placed sheaths and stents in the left and right renal arteries as well as deploying the main body device. After the sheath and then removed from the left groin I then cinched down the 2 Proglide. There were still some residual oozing. Therefore a third Proglide was placed back over the Bentson wire and fired in AP position. There was good hemostasis after this. The guidewire was removed. All 3 Proglide sutures were trimmed back. The insertion site was closed with a Vicryl stitch. The patient our procedure well and there were no complications. Interest sponge and needle count was correct in the case. The patient was taken recovery room in stable condition.  Ruta Hinds, MD [facility] Ve[facility] Specialists of [place] Office: [phone] Pager: [phone]

## 2015-11-30 NOTE — Anesthesia Preprocedure Evaluation (Addendum)
Anesthesia Evaluation  Patient identified by MRN, date of birth, ID band Patient awake    Reviewed: Allergy & Precautions, NPO status , Patient's Chart, lab work & pertinent test results  History of Anesthesia Complications Negative for: history of anesthetic complications  Airway Mallampati: II  TM Distance: <3 FB Neck ROM: Full    Dental  (+) Edentulous Upper, Dental Advisory Given   Pulmonary shortness of breath and with exertion, sleep apnea , COPD, Current Smoker,     + decreased breath sounds      Cardiovascular hypertension, Pt. on medications and Pt. on home beta blockers + CAD, + Past MI, + Peripheral Vascular Disease and + DOE   Rhythm:Regular Rate:Normal     Neuro/Psych  Headaches,  Neuromuscular disease    GI/Hepatic GERD  Medicated and Controlled,(+) Hepatitis -, B  Endo/Other  diabetes, Type 2Morbid obesity  Renal/GU      Musculoskeletal  (+) Arthritis , Osteoarthritis,    Abdominal (+) + obese,   Peds  Hematology   Anesthesia Other Findings   Reproductive/Obstetrics                            Anesthesia Physical Anesthesia Plan  ASA: IV  Anesthesia Plan: General   Post-op Pain Management:    Induction: Intravenous  Airway Management Planned: Oral ETT  Additional Equipment: Arterial line  Intra-op Plan:   Post-operative Plan: Extubation in OR  Informed Consent: I have reviewed the patients History and Physical, chart, labs and discussed the procedure including the risks, benefits and alternatives for the proposed anesthesia with the patient or authorized representative who has indicated his/her understanding and acceptance.   Dental advisory given  Plan Discussed with: CRNA, Anesthesiologist and Surgeon  Anesthesia Plan Comments:        Anesthesia Quick Evaluation

## 2015-11-30 NOTE — Anesthesia Procedure Notes (Signed)
Date/Time: 11/30/2015 8:57 AM Performed by: Carney Living Pre-anesthesia Checklist: Patient identified, Emergency Drugs available, Suction available, Patient being monitored and Timeout performed Patient Re-evaluated:Patient Re-evaluated prior to inductionOxygen Delivery Method: Circle system utilized Preoxygenation: Pre-oxygenation with 100% oxygen Intubation Type: IV induction Laryngoscope Size: Glidescope and 4 Grade View: Grade I Tube type: Oral Tube size: 8.0 mm Number of attempts: 1 Airway Equipment and Method: Stylet Placement Confirmation: ETT inserted through vocal cords under direct vision,  positive ETCO2 and breath sounds checked- equal and bilateral Secured at: 23 cm Tube secured with: Tape Dental Injury: Teeth and Oropharynx as per pre-operative assessment

## 2015-12-01 ENCOUNTER — Inpatient Hospital Stay (HOSPITAL_COMMUNITY): Payer: Medicare PPO

## 2015-12-01 ENCOUNTER — Other Ambulatory Visit: Payer: Self-pay | Admitting: *Deleted

## 2015-12-01 DIAGNOSIS — I714 Abdominal aortic aneurysm, without rupture, unspecified: Secondary | ICD-10-CM

## 2015-12-01 DIAGNOSIS — Z9889 Other specified postprocedural states: Secondary | ICD-10-CM

## 2015-12-01 DIAGNOSIS — Z95828 Presence of other vascular implants and grafts: Secondary | ICD-10-CM

## 2015-12-01 LAB — CBC
HEMATOCRIT: 34.7 % — AB (ref 39.0–52.0)
Hemoglobin: 11.1 g/dL — ABNORMAL LOW (ref 13.0–17.0)
MCH: 32.5 pg (ref 26.0–34.0)
MCHC: 32 g/dL (ref 30.0–36.0)
MCV: 101.5 fL — AB (ref 78.0–100.0)
Platelets: 74 10*3/uL — ABNORMAL LOW (ref 150–400)
RBC: 3.42 MIL/uL — ABNORMAL LOW (ref 4.22–5.81)
RDW: 15.2 % (ref 11.5–15.5)
WBC: 7.7 10*3/uL (ref 4.0–10.5)

## 2015-12-01 LAB — BASIC METABOLIC PANEL
Anion gap: 6 (ref 5–15)
BUN: 8 mg/dL (ref 6–20)
CALCIUM: 8.3 mg/dL — AB (ref 8.9–10.3)
CHLORIDE: 105 mmol/L (ref 101–111)
CO2: 25 mmol/L (ref 22–32)
CREATININE: 0.81 mg/dL (ref 0.61–1.24)
GFR calc Af Amer: >60 mL/min (ref >60)
GFR calc non Af Amer: >60 mL/min (ref >60)
GLUCOSE: 133 mg/dL — AB (ref 65–99)
Potassium: 4.2 mmol/L (ref 3.5–5.1)
Sodium: 136 mmol/L (ref 135–145)

## 2015-12-01 LAB — HEMOGLOBIN A1C
Hgb A1c MFr Bld: 5.5 % (ref 4.8–5.6)
MEAN PLASMA GLUCOSE: 111 mg/dL

## 2015-12-01 LAB — GLUCOSE, CAPILLARY
Glucose-Capillary: 118 mg/dL — ABNORMAL HIGH (ref 65–99)
Glucose-Capillary: 157 mg/dL — ABNORMAL HIGH (ref 65–99)
Glucose-Capillary: 111 mg/dL — ABNORMAL HIGH (ref 65–99)
Glucose-Capillary: 103 mg/dL — ABNORMAL HIGH (ref 65–99)

## 2015-12-01 NOTE — Progress Notes (Signed)
Preliminary results by tech - Renal Duplex Completed. Bilateral renal arteries demonstrated patency with mildly elevated velocities in the right renal arteries suggestive of less than 60% stenosis. The left renal artery demonstrated normal patency. The celiac artery and SMA demonstrated patency without significant stenosis noted.  Oda Cogan, BS, RDMS, RVT

## 2015-12-01 NOTE — Progress Notes (Addendum)
[facility] [place]  Subjective  - He has voided, pending breakfast he will ambulate in the halls.   Objective 157/80 88 98.6 F (37 C) (Oral) 14 89%  Intake/Output Summary (Last 24 hours) at 12/01/15 0720 Last data filed at 12/01/15 0645  Gross per 24 hour  Intake 5737.92 ml  Output   3725 ml  Net 2012.92 ml    Right groin with small bloody drainage, no active drainage Left groin clean and dry Palpable DP 3+ pulses bilaterally  Assessment/Planning: POD # 1 Fenestrated Cook stent graft repair of abdominal aortic aneurysm Pending renal ultrasound for check on renal artery and SMA patency Pending tolerating breakfast and ambulation plan for discharge home today. No new medications will be prescribed he has pain medication at home. F/U in 4 weeks with CTA abd/pelvis  Laurence Slate [facility] 12/01/2015 7:20 AM --  Laboratory Lab Results:  Recent Labs  11/30/15 1420 12/01/15 0439  WBC 7.2 7.7  HGB 10.8* 11.1*  HCT 33.7* 34.7*  PLT 74* 74*   BMET  Recent Labs  11/30/15 1420 12/01/15 0439  NA 135 136  K 3.8 4.2  CL 106 105  CO2 24 25  GLUCOSE 110* 133*  BUN 10 8  CREATININE 0.94 0.81  CALCIUM 8.0* 8.3*    COAG Lab Results  Component Value Date   INR 1.29 11/30/2015   INR 1.18 11/22/2015   INR 1.18 08/06/2015   No results found for: PTT

## 2015-12-01 NOTE — Care Management Important Message (Signed)
Important Message  Patient Details  Name: [name] MRN: [id number] Date of Birth: [birth year]   Medicare Important Message Given:  Yes    Loann Quill 12/01/2015, 10:07 AM

## 2015-12-01 NOTE — Op Note (Signed)
Patient name: [name] MRN: [id number] DOB: [birth year] Sex: male  11/30/2015 Pre-operative Diagnosis: Juxtarenal abdominal aortic aneurysm  Post-operative diagnosis:  Same Surgeon:  [name] Co-Surgeon:  Ruta Hinds Procedure:   1.)  Fenestrated repair of juxtarenal abdominal aortic aneurysm   2.)  Ultrasound guided bilateral common femoral artery access   3.)  Abdominal and bilateral renal angiogram   4.)  Stent, left renal artery   5.)  Stent, right renal artery   6.)  Distal extention (left) Anesthesia:  Gen. Blood Loss:  See anesthesia record Specimens:  None  Findings:  Complete exclusion  Indications:  The patient initially presented with a juxtarenal abdominal aortic aneurysm.  We have delayed his operation because of hidradenitis.  He has nearly resolved this after approximately 1-1/2 years.  An comes in today for repair.  His most recent CT scan shows that the initial device will still treat his aneurysm as there has not been significant growth Devices used:  COOK ZFEN P2-28-124.  D 16-62-76.  Contra left ZSLE 16x56,  R renal VBX 6x29.  L Renal 7x29  Procedure:  The patient was identified in the holding area and taken to [place] 16  The patient was then placed supine on the table. general anesthesia was administered.  The patient was prepped and draped in the usual sterile fashion.  A time out was called and antibiotics were administered.  Ultrasound was used to evaluate bilateral common femoral arteries which were patent and compressible.  A digital image was acquired.  A #11 blade was used to make a skin nick.  Under ultrasound guidance, an 18-gauge needle was used to cannulate bilateral common femoral arteries.  An 035 wire was advanced without resistance.  The subtenon's tract was dilated with an 8 [place] dilator.  Provide devices were deployed at the 11:00 and 1:00 position for pre-closure.  8 French sheaths were placed bilaterally.  The patient was fully  heparinized.  The main body was prepared on the back table.  This was a [employer] P-2-28-124.  A Lunderquist wire was advanced up the left side.  The main body device was inserted into the abdomen without difficulty.  It was initially oriented on the back table.  The center 3 marks were 90 off of the pen device. Next a pigtail catheter was advanced up the right and a abdominal aortogram was performed locating the renal arteries.  Next, the image overlay software was applied.  The device was placed in its appropriate position and orientation and then the top 2 stents were deployed.  We shot an additional arteriogram to confirm the device was in the appropriate location and then the remaining portion of the device was deployed.  The gate to the proximal body was then cannulated.  Over a Lunderquist wire, a 63 French dry seal sheath was advanced into the proximal body.  The dry seal sheath, 7 French Ansel 155 cm sheaths were inserted.  Using a Berenstein catheter, the right renal scallop and the left renal fenestration were cannulated.  This was done with a Glidewire.  On the right the Berenstein catheter was able to be advanced out of the renal artery and a Rosen wire was placed.  The dilator was advanced through the sheath which was then taken out into the right renal artery.  On the left, a quick cross catheter was used for wire exchange to get a Rosen wire out into the distal renal artery before putting the  dilator back into the 7 French sheath and advancing it out into the mid left renal artery.  Next, the renal stents were inserted into the 7 French sheath.  On the right a Gore VBX 6 x 29 was inserted and on the left a Gore 7 x 29 VBX was placed.  They were left with just the tip sticking out of the sheath.  Next, the supra renal top cap was removed, deploying the fixation stent.  Deployment system was then removed under direct vision.  A 32 mm coated balloon was used to mold the proximal body.  Next, each renal  stent was deployed with approximately 4 mm overlap into the aorta.  The balloons were taken to nominal pressure.  A 10 x 2 balloon Was then used to flare the renal stents on the inferior and superior side.  Imaging studies were performed which showed that the stents were patent.  Wire access and sheath access to the renal arteries was then of given up.  Next, the distal the's was advanced through the left side.  This was a Lacinda Axon  D-16-62-76.  It was deployed with the contralateral gate sticking just at the proximal piece.  The contralateral gate was then cannulated from the right side with a Berenstein catheter and a Glidewire.  To confirm that the gate was successfully cannulated, the 32 coated balloon was inflated within the contralateral gate and the appropriate shape of the balloon was encountered.  I also went I released the proximal fixation for the distal device I had the Coda balloon up against it so when it open the balloon went sailing proximal.  Next, the image detector was rotated to a left anterior oblique position and a retrograde injection was performed to the right groin locating the right hypogastric artery.  A distal extension was placed.  This was a  ZSLE 16-56.  It was deployed landing proximal to the hypogastric artery.  Next, the remaining portion of the ipsilateral limb was deployed.  This landed just proximal to the left hypogastric artery.  Next, the Coda balloon was used to mold device overlap's.  A completion arteriogram was then performed which showed patency of bilateral renal stents as well as patency of the superior mesenteric artery.  Bilateral hypogastric arteries were widely patent.  There was no endoleak.  There was successful exclusion of aneurysm.  Next, the stiff wires were exchanged out for [facility] wire.  The provide devices were secured, closing the arteriotomy site.  A third probe glide was required on the left side.  The groin incisions were then closed with Vicryl followed by  Dermabond.  There were excellent pedal signals.  The patient was successfully extubated and taken to recovery room in stable condition.  There were no immediate complications.     Disposition:  To PACU in stable condition.   Theotis Burrow, M.D. [facility] [place] Office: [phone] Pager:  [phone]

## 2015-12-02 ENCOUNTER — Other Ambulatory Visit: Payer: Self-pay | Admitting: *Deleted

## 2015-12-02 DIAGNOSIS — I714 Abdominal aortic aneurysm, without rupture, unspecified: Secondary | ICD-10-CM

## 2015-12-02 DIAGNOSIS — Z95828 Presence of other vascular implants and grafts: Secondary | ICD-10-CM

## 2015-12-02 LAB — GLUCOSE, CAPILLARY: Glucose-Capillary: 112 mg/dL — ABNORMAL HIGH (ref 65–99)

## 2015-12-02 NOTE — Progress Notes (Signed)
Order to d/c pt.  Removed IVs.  Provided pt and wife with education, reviewed medications, and answered all questions.

## 2015-12-02 NOTE — Progress Notes (Signed)
[facility] [place]  Subjective  -  He is tolerating PO's well, ambulated and voided.  Pain is well controlled.   Objective 144/68 79 99 F (37.2 C) (Oral) 11 96%  Intake/Output Summary (Last 24 hours) at 12/02/15 0721 Last data filed at 12/02/15 0700  Gross per 24 hour  Intake      0 ml  Output    400 ml  Net   -400 ml   Preliminary results by tech - Renal Duplex Completed. Bilateral renal arteries demonstrated patency with mildly elevated velocities in the right renal arteries suggestive of less than 60% stenosis. The left renal artery demonstrated normal patency. The celiac artery and SMA demonstrated patency without significant stenosis noted.  Rita Sturdivant, BS, RDMS, RVT  Palpable DP 3+ pulses Right groin moist dry dressing applied, incision intact, left groin clean and dry Abdomin soft NTTP  Assessment/Planning: POD # 2 Fenestrated Cook stent graft repair of abdominal aortic aneurysm Renal and SMA patent  F/U in 4 weeks with CTA abd/pelvis    Theda Sers, Mikaylah Libbey [facility] 12/02/2015 7:21 AM --  Laboratory Lab Results:  Recent Labs  11/30/15 1420 12/01/15 0439  WBC 7.2 7.7  HGB 10.8* 11.1*  HCT 33.7* 34.7*  PLT 74* 74*   BMET  Recent Labs  11/30/15 1420 12/01/15 0439  NA 135 136  K 3.8 4.2  CL 106 105  CO2 24 25  GLUCOSE 110* 133*  BUN 10 8  CREATININE 0.94 0.81  CALCIUM 8.0* 8.3*    COAG Lab Results  Component Value Date   INR 1.29 11/30/2015   INR 1.18 11/22/2015   INR 1.18 08/06/2015   No results found for: PTT

## 2015-12-06 ENCOUNTER — Encounter (HOSPITAL_COMMUNITY): Payer: Self-pay | Admitting: Surgery

## 2015-12-08 ENCOUNTER — Telehealth: Payer: Self-pay | Admitting: Surgery

## 2015-12-08 NOTE — Telephone Encounter (Signed)
Sched appts 01/03/16. CTA at [place] at 2:00 and MD at 3:00. Lm on hm# to inform pt.

## 2015-12-08 NOTE — Telephone Encounter (Signed)
-----   Message from Mena Goes, RN sent at 12/01/2015  9:53 AM EDT ----- Regarding: 4 week postop with CTA    ----- Message -----    From: Ulyses Amor, PA-C    Sent: 12/01/2015   7:27 AM      To: Vvs Charge Pool  F/U in 4 weeks with Dr. Trula Slade s/p EVAR needs CTA abdomin and pelvis.

## 2015-12-10 NOTE — Discharge Summary (Signed)
[facility] Discharge Summary   Patient ID:  [name] MRN: [id number] DOB/AGE: 61-Oct-1957 61 y.o.  Admit date: 11/30/2015 Discharge date: 12/10/2015 Date of Surgery: 11/30/2015 Surgeon: Surgeon(s): Serafina Mitchell, MD Elam Dutch, MD  Admission Diagnosis: Juxtarenal abdominal aortic aneurysm I71.4  Discharge Diagnoses:  Juxtarenal abdominal aortic aneurysm I71.4  Secondary Diagnoses: Past Medical History  Diagnosis Date  . Ischemic cardiomyopathy     s/p inferior MI  --  current ef per myoview 39%  . T2DM (type 2 diabetes mellitus) ([place])     ABIs WNL 2016  . Disturbances of sensation of smell and taste     improving  . Gout   . Hidradenitis suppurativa dx 2011   goin and leg crease    followd by Lyndle Herrlich - daily bactrim, s/p intralesional steroid injection 10/2010  . HLD (hyperlipidemia)   . HTN (hypertension)   . Obesity   . Vitamin D deficiency   . Allergic rhinitis   . Lumbar herniated disc   . Spinal stenosis     released from [place].  established with [facility] (07/2013)  . History of hepatitis B 1983  . History of viral meningitis 2000  . Chronic pain syndrome     established with [facility] ([place]) --> disagreement and transfered care to Dr Sanjuan Dame at [facility] clinic [facility], requests PCP write Rx but f/u with pain clinic Q6-12 months  . Liver cirrhosis secondary to NASH 01/2014    by CT scan, rec virtual colonoscopy by Dr Collene Mares 06/2014  . Nocturia more than twice per night   . CAD (coronary artery disease) cardiologist-  dr Stanford Breed    x3 with stents last 2005, EF 40%, predominantly RCA by CT 2016  . History of MI (myocardial infarction)     2000  &  2005  . AAA (abdominal aortic aneurysm) ([place]) 09/2012--  monitored by dr Trula Slade    stable 5.6cm CTA abdomen 2016  . Dyspnea on exertion   . Cervical spondylosis 05/2010    s/p surgery  . DDD (degenerative disc disease)   . Hip osteoarthritis     s/p  intraarticular steroid shot (12/2012) ([place])  . B12 deficiency   . Hidradenitis     right groin  . GERD (gastroesophageal reflux disease)   . Hepatitis     hepatitis B  . Headache   . Cataracts, bilateral   . Charcot Marie Tooth muscular atrophy dx  1975    neurologist--  dr love--  type 2 per pt  . Myocardial infarction ([place])     x2  . COPD (chronic obstructive pulmonary disease) ([place]) 10/2011    minimal by PFTs  . History of pneumonia     Procedure(s): ABDOMINAL AORTIC ENDOVASCULAR FENESTRATED STENT GRAFT  Discharged Condition: good  HPI: The patient is here today for a wound check. He has previously undergone resection of bilateral inguinal hidradenitis by Dr. Rush Farmer. We are awaiting his incisions to heal so that we can proceed with fenestrated endovascular aneurysm repair. He is doing everything he can at home to keep these areas dry. He does not have any fevers or chills.  He recently saw a dermatologist who said this is the best the wound has looked in a long time.  He denies having any abdominal pain.   Hospital Course:  [name] is a 60 y.o. male is S/P  Procedure(s): ABDOMINAL AORTIC ENDOVASCULAR FENESTRATED STENT GRAFT  POD#1-2 Preliminary results by tech -  Renal Duplex Completed. Bilateral renal arteries demonstrated patency with mildly elevated velocities in the right renal arteries suggestive of less than 60% stenosis. The left renal artery demonstrated normal patency. The celiac artery and SMA demonstrated patency without significant stenosis noted.  Rita Sturdivant, BS, RDMS, RVT  Palpable DP 3+ pulses Right groin moist dry dressing applied, incision intact, left groin clean and dry Abdomin soft NTTP  Assessment/Planning: Fenestrated Cook stent graft repair of abdominal aortic aneurysm Renal and SMA patent  Stable for discharge      Significant Diagnostic Studies: CBC Lab Results  Component Value Date   WBC 7.7 12/01/2015    HGB 11.1* 12/01/2015   HCT 34.7* 12/01/2015   MCV 101.5* 12/01/2015   PLT 74* 12/01/2015    BMET    Component Value Date/Time   NA 136 12/01/2015 0439   K 4.2 12/01/2015 0439   CL 105 12/01/2015 0439   CO2 25 12/01/2015 0439   GLUCOSE 133* 12/01/2015 0439   BUN 8 12/01/2015 0439   CREATININE 0.81 12/01/2015 0439   CREATININE 0.80 04/09/2015 1530   CALCIUM 8.3* 12/01/2015 0439   GFRNONAA >60 12/01/2015 0439   GFRNONAA >89 01/05/2014 1159   GFRAA >60 12/01/2015 0439   GFRAA >89 01/05/2014 1159   COAG Lab Results  Component Value Date   INR 1.29 11/30/2015   INR 1.18 11/22/2015   INR 1.18 08/06/2015     Disposition:  Discharge to :Home Discharge Instructions    Call MD for:  redness, tenderness, or signs of infection (pain, swelling, bleeding, redness, odor or green/yellow discharge around incision site)    Complete by:  As directed      Call MD for:  severe or increased pain, loss or decreased feeling  in affected limb(s)    Complete by:  As directed      Call MD for:  temperature >100.5    Complete by:  As directed      Discharge instructions    Complete by:  As directed   You may shower 48 hours from surgery.  Keep groins clean and dry.     Discharge patient    Complete by:  As directed   Discharge pt to home     Driving Restrictions    Complete by:  As directed   No driving for 1-2 weeks unless it is an emergency.     Lifting restrictions    Complete by:  As directed   No heavy lifting for 6 weeks     Resume previous diet    Complete by:  As directed             Medication List    TAKE these medications        albuterol (2.5 MG/3ML) 0.083% nebulizer solution  Commonly known as:  PROVENTIL  USE 1 VIAL PER NEBULIZER EVERY 6 HRS AS NEEDED FOR WHEEZING     albuterol 108 (90 Base) MCG/ACT inhaler  Commonly known as:  PROVENTIL HFA;VENTOLIN HFA  Inhale 2 puffs into the lungs every 6 (six) hours as needed for wheezing or shortness of breath.      aspirin 325 MG tablet  Take 1 tablet (325 mg total) by mouth daily.     cholecalciferol 1000 units tablet  Commonly known as:  VITAMIN D  Take 1,000 Units by mouth daily. Reported on 08/02/2015     DULoxetine 60 MG capsule  Commonly known as:  CYMBALTA  TAKE ONE CAPSULE BY MOUTH DAILY  fentaNYL 50 MCG/HR  Commonly known as:  DURAGESIC - dosed mcg/hr  Place 1 patch (50 mcg total) onto the skin every other day.     fluocinonide cream 0.05 %  Commonly known as:  LIDEX  Apply 1 application topically 2 (two) times daily.     fluticasone 50 MCG/ACT nasal spray  Commonly known as:  FLONASE  Place 2 sprays into both nostrils daily as needed for allergies.     folic acid 1 MG tablet  Commonly known as:  FOLVITE  TAKE 1 TABLET BY MOUTH DAILY     glucose blood test strip  1 each by Other route as needed for other. Use as instructed to check sugar once daily and as needed. **ONE TOUCH ULTRA** Dx: E11.9     irbesartan 75 MG tablet  Commonly known as:  AVAPRO  Take 0.5 tablets (37.5 mg total) by mouth daily.     metFORMIN 500 MG tablet  Commonly known as:  GLUCOPHAGE  TAKE 1/2 TABLET BY MOUTH EVERY NIGHT AT BEDTIME.     metoprolol succinate 25 MG 24 hr tablet  Commonly known as:  TOPROL-XL  TAKE 1/2 TABLET EVERY DAY     nitroGLYCERIN 0.4 MG SL tablet  Commonly known as:  NITROSTAT  Place 1 tablet (0.4 mg total) under the tongue every 5 (five) minutes as needed for chest pain.     nystatin 100000 UNIT/GM Powd  Apply 20 g topically 2 (two) times daily.     Oxycodone HCl 20 MG Tabs  Take 20 mg by mouth every 6 (six) hours as needed (pain).     oxyCODONE 15 MG immediate release tablet  Commonly known as:  ROXICODONE  Take 1 tablet (15 mg total) by mouth every 4 (four) hours as needed.     polyethylene glycol powder powder  Commonly known as:  GLYCOLAX/MIRALAX  Take 17 g by mouth daily as needed for moderate constipation.     TART CHERRY ADVANCED Caps  Take 1 capsule by  mouth 2 (two) times daily.     ULORIC 40 MG tablet  Generic drug:  febuxostat  TAKE 1 TABLET (40 MG TOTAL) BY MOUTH DAILY. ALLOPURINOL INTOLERANT     vitamin B-12 500 MCG tablet  Commonly known as:  CYANOCOBALAMIN  Take 500 mcg by mouth daily.       Verbal and written Discharge instructions given to the patient. Wound care per Discharge AVS     Follow-up Information    Follow up with [name], MD In 4 weeks.   Specialties:  Vascular Surgery, Cardiology   Why:  Office will call you to arrange your appt (sent)   Contact information:   [place] [id number] [phone]       Signed: Laurence Slate [facility] 12/10/2015, 10:08 AM  - For VQI Registry use --- Instructions: Press F2 to tab through selections.  Delete question if not applicable.   Post-op:  Time to Extubation: [x ] In OR, [ ]  < 12 hrs, [ ]  12-24 hrs, [ ]  >=24 hrs Vasopressors Req. Post-op: No MI: [x ] No, [ ]  Troponin only, [ ]  EKG or Clinical New Arrhythmia: No CHF: No ICU Stay: 2 days Transfusion: No  If yes, 0 units given  Complications: Resp failure: [x ] none, [ ]  Pneumonia, [ ]  Ventilator Chg in renal function: [x ] none, [ ]  Inc. Cr > 0.5, [ ]  Temp. Dialysis, [ ]  Permanent dialysis Leg ischemia: [x ] No, [ ]   Yes, no Surgery needed, [ ]  Yes, Surgery needed, [ ]  Amputation Bowel ischemia: [x ] No, [ ]  Medical Rx, [ ]  Surgical Rx Wound complication: [x ] No, [ ]  Superficial separation/infection, [ ]  Return to OR Return to OR: No  Return to OR for bleeding: No Stroke: [ ]  None, [ ]  Minor, [ ]  Major  Discharge medications: Statin use:  No  for medical reason   ASA use:  Yes Plavix use:  No  for medical reason   Beta blocker use:  No  for medical reason

## 2015-12-22 ENCOUNTER — Telehealth: Payer: Self-pay

## 2015-12-22 NOTE — Telephone Encounter (Signed)
[name] left v/m requesting order to [name] to get PT in the home to help pt walk again and order for an aide to assist in bathing pt. [employer] contact # [phone]. Miguel [name] request cb to her or [name]. Pt last seen 06/18/15.Please advise.

## 2015-12-23 ENCOUNTER — Encounter: Payer: Self-pay | Admitting: Surgery

## 2015-12-23 NOTE — Telephone Encounter (Signed)
plz call Joycelyn Schmid - I believe for me to schedule HH need to have face to face evaluation. Would suggest they contact vascular surgeon who did surgery for home health referral first, if they're unable to do this, rec schedule appointment.

## 2015-12-23 NOTE — Telephone Encounter (Signed)
Message left advising [name].

## 2015-12-24 ENCOUNTER — Other Ambulatory Visit: Payer: Self-pay | Admitting: Family Medicine

## 2016-01-03 ENCOUNTER — Encounter: Payer: Self-pay | Admitting: Surgery

## 2016-01-03 ENCOUNTER — Ambulatory Visit (INDEPENDENT_AMBULATORY_CARE_PROVIDER_SITE_OTHER): Payer: Self-pay | Admitting: Surgery

## 2016-01-03 ENCOUNTER — Ambulatory Visit
Admission: RE | Admit: 2016-01-03 | Discharge: 2016-01-03 | Disposition: A | Discharge status: Home / Self Care | Payer: Medicare PPO | Source: Ambulatory Visit | Attending: Surgery | Admitting: Surgery

## 2016-01-03 VITALS — BP 115/76 | HR 95 | Temp 98.2°F | Resp 20 | Ht 67.0 in | Wt 319.0 lb

## 2016-01-03 DIAGNOSIS — I714 Abdominal aortic aneurysm, without rupture, unspecified: Secondary | ICD-10-CM

## 2016-01-03 DIAGNOSIS — Z95828 Presence of other vascular implants and grafts: Secondary | ICD-10-CM

## 2016-01-03 MED ORDER — IOPAMIDOL (ISOVUE-370) INJECTION 76%
100.0000 mL | Freq: Once | INTRAVENOUS | Status: AC | PRN
  Administered 2016-01-03: 100 mL via INTRAVENOUS

## 2016-01-03 NOTE — Progress Notes (Signed)
Patient name: [name] MRN: [id number] DOB: April 15, 1956 Sex: male  REASON FOR VISIT: post-op  HPI: [name] is a 60 y.o. male returns today for his first postoperative visit.  On 12/02/2015, he underwent a fenestrated endovascular repair of a juxtarenal abdominal aortic aneurysm.  Maximum aortic diameter was 5.9 cm.  The patient's procedure had been delayed on multiple occasions because of groin infections.  He tolerated the procedure well and has been home without difficulty.  He states that he had a boil open up in his groin yesterday.  He has fallen once THE steps.  Current Outpatient Prescriptions  Medication Sig Dispense Refill  . albuterol (PROVENTIL HFA;VENTOLIN HFA) 108 (90 Base) MCG/ACT inhaler Inhale 2 puffs into the lungs every 6 (six) hours as needed for wheezing or shortness of breath. 1 Inhaler 0  . albuterol (PROVENTIL) (2.5 MG/3ML) 0.083% nebulizer solution USE 1 VIAL PER NEBULIZER EVERY 6 HRS AS NEEDED FOR WHEEZING 75 mL 6  . aspirin 325 MG tablet Take 1 tablet (325 mg total) by mouth daily. 90 tablet 3  . cholecalciferol (VITAMIN D) 1000 units tablet Take 1,000 Units by mouth daily. Reported on 08/02/2015    . DULoxetine (CYMBALTA) 60 MG capsule TAKE ONE CAPSULE BY MOUTH DAILY 30 capsule 11  . fentaNYL (DURAGESIC - DOSED MCG/HR) 50 MCG/HR Place 1 patch (50 mcg total) onto the skin every other day. 15 patch 0  . fluocinonide cream (LIDEX) 3.41 % Apply 1 application topically 2 (two) times daily.    . fluticasone (FLONASE) 50 MCG/ACT nasal spray Place 2 sprays into both nostrils daily as needed for allergies.     . folic acid (FOLVITE) 1 MG tablet TAKE 1 TABLET BY MOUTH DAILY 90 tablet 0  . glucose blood test strip 1 each by Other route as needed for other. Use as instructed to check sugar once daily and as needed. **ONE TOUCH ULTRA** Dx: E11.9 100 each 3  . irbesartan (AVAPRO) 75 MG tablet Take 0.5 tablets (37.5 mg total) by  mouth daily. 45 tablet 6  . metFORMIN (GLUCOPHAGE) 500 MG tablet TAKE 1/2 TABLET BY MOUTH EVERY NIGHT AT BEDTIME. 30 tablet 6  . metoprolol succinate (TOPROL-XL) 25 MG 24 hr tablet TAKE 1/2 TABLET EVERY DAY 45 tablet 6  . Misc Natural Products (TART CHERRY ADVANCED) CAPS Take 1 capsule by mouth 2 (two) times daily.    . nitroGLYCERIN (NITROSTAT) 0.4 MG SL tablet Place 1 tablet (0.4 mg total) under the tongue every 5 (five) minutes as needed for chest pain. 25 tablet 12  . nystatin (MYCOSTATIN/NYSTOP) 100000 UNIT/GM POWD Apply 20 g topically 2 (two) times daily.    [name] oxyCODONE (ROXICODONE) 15 MG immediate release tablet Take 1 tablet (15 mg total) by mouth every 4 (four) hours as needed. 180 tablet 0  . polyethylene glycol powder (GLYCOLAX/MIRALAX) powder Take 17 g by mouth daily as needed for moderate constipation. 3350 g 0  . ULORIC 40 MG tablet TAKE 1 TABLET (40 MG TOTAL) BY MOUTH DAILY. ALLOPURINOL INTOLERANT 90 tablet 2  . vitamin B-12 (CYANOCOBALAMIN) 500 MCG tablet Take 500 mcg by mouth daily.    . Oxycodone HCl 20 MG TABS Take 20 mg by mouth every 6 (six) hours as needed (pain).     No current facility-administered medications for this visit.     REVIEW OF SYSTEMS:  [X]  denotes positive finding, [ ]  denotes negative finding Cardiac  Comments:  Chest pain or chest pressure:  Shortness of breath upon exertion:    Short of breath when lying flat:    Irregular heart rhythm:    Constitutional    Fever or chills:      PHYSICAL EXAM: Vitals:   01/03/16 1505  BP: 115/76  Pulse: 95  Resp: 20  Temp: 98.2 F (36.8 C)  TempSrc: Oral  SpO2: 95%  Weight: (!) 319 lb (144.7 kg)  Height: 5' 7"  (1.702 m)    GENERAL: The patient is a well-nourished male, in no acute distress. The vital signs are documented above. CARDIOVASCULAR: There is a regular rate and rhythm. PULMONARY: There is good air exchange bilaterally without wheezing or rales. Groin incisions appear to be healing  nicely.  MEDICAL ISSUES: I have reviewed his CT scan which has not yet been read by radiology.  The stent graft appears to be in good position with excellent patency of the renal stent and mesenteric artery.  I did not see any evidence of endoleak.  I have patient scheduled for follow-up in 6 months with repeat CT scan.  [name], MD [facility] [phone] Pager [phone]

## 2016-01-04 ENCOUNTER — Other Ambulatory Visit: Payer: Self-pay

## 2016-01-04 DIAGNOSIS — G8929 Other chronic pain: Secondary | ICD-10-CM | POA: Insufficient documentation

## 2016-01-04 MED ORDER — FENTANYL 50 MCG/HR TD PT72: 50.0000 ug | MEDICATED_PATCH | TRANSDERMAL | 0 refills | Status: DC

## 2016-01-04 MED ORDER — OXYCODONE HCL 15 MG PO TABS: 15.0000 mg | ORAL_TABLET | ORAL | 0 refills | Status: DC | PRN

## 2016-01-04 NOTE — Telephone Encounter (Signed)
[name] left v/m requesting rx for fentanyl. Call when ready for pick up. Last printed # 15 on 09/10/15. Last saw Dr Darnell Level on 06/18/15. [name] knows it is not time but request rx for oxycodone. Note on oxycodone med list from [facility] pt started 11/28/15 for one month after surgery and then drop back to oxycodone 15 mg.Please advise.

## 2016-01-04 NOTE — Telephone Encounter (Signed)
Rx printed and in [place]' box. Received oxycodone #180 12/18/2015. Fill oxycodone on or after 01/14/2016.

## 2016-01-05 ENCOUNTER — Encounter: Payer: Self-pay | Admitting: Family Medicine

## 2016-01-05 NOTE — Telephone Encounter (Signed)
Patient notified and Rx's placed up front for pick up.

## 2016-01-11 ENCOUNTER — Encounter (HOSPITAL_COMMUNITY): Payer: Self-pay | Admitting: Surgery

## 2016-02-01 ENCOUNTER — Other Ambulatory Visit: Payer: Self-pay

## 2016-02-01 NOTE — Telephone Encounter (Signed)
[name] left v/m (DPR signed) requesting rx Fentanyl patch; last printed # 15 on 01/04/16; last seen 06/18/15.

## 2016-02-02 MED ORDER — FENTANYL 50 MCG/HR TD PT72: 50.0000 ug | MEDICATED_PATCH | TRANSDERMAL | 0 refills | Status: DC

## 2016-02-02 NOTE — Telephone Encounter (Signed)
Printed and in [name] box.

## 2016-02-02 NOTE — Telephone Encounter (Signed)
Message left advising patient and Rx placed up front for pick up.

## 2016-02-03 ENCOUNTER — Other Ambulatory Visit: Payer: Self-pay

## 2016-02-03 NOTE — Telephone Encounter (Signed)
[name] left v/m(DPR signed) requesting rx oxycodone. Call when ready for pick up. Last printed #180 on 01/04/16. Last seen 06/18/15.

## 2016-02-04 MED ORDER — OXYCODONE HCL 15 MG PO TABS: 15.0000 mg | ORAL_TABLET | ORAL | 0 refills | Status: DC | PRN

## 2016-02-04 NOTE — Telephone Encounter (Signed)
pT CAME AND PICKED UP RX

## 2016-02-04 NOTE — Telephone Encounter (Signed)
Printed and in [employer].

## 2016-02-24 ENCOUNTER — Ambulatory Visit: Payer: Medicare PPO | Admitting: Family Medicine

## 2016-03-07 ENCOUNTER — Other Ambulatory Visit: Payer: Self-pay

## 2016-03-07 NOTE — Telephone Encounter (Signed)
[name] (do not see DPR) left v/m requesting rx oxycodone (last printed # 180 on 02/04/16 and fentanyl patch (last printed # 15 on 02/02/16). Pt last seen by Dr Darnell Level on 06/18/15.

## 2016-03-08 MED ORDER — FENTANYL 50 MCG/HR TD PT72: 50.0000 ug | MEDICATED_PATCH | TRANSDERMAL | 0 refills | Status: DC

## 2016-03-08 MED ORDER — OXYCODONE HCL 15 MG PO TABS: 15.0000 mg | ORAL_TABLET | ORAL | 0 refills | Status: DC | PRN

## 2016-03-08 NOTE — Telephone Encounter (Signed)
Printed and in [name] box.

## 2016-03-08 NOTE — Telephone Encounter (Signed)
Message left advising patient and Rx's placed up front for pick up.

## 2016-03-09 ENCOUNTER — Telehealth: Payer: Self-pay | Admitting: *Deleted

## 2016-03-09 NOTE — Telephone Encounter (Signed)
Oxycodone required PA. Completed via Cover My Meds. Will await determination.

## 2016-03-13 NOTE — Telephone Encounter (Signed)
PA approved.

## 2016-03-31 DIAGNOSIS — M1611 Unilateral primary osteoarthritis, right hip: Secondary | ICD-10-CM | POA: Diagnosis not present

## 2016-03-31 DIAGNOSIS — M25551 Pain in right hip: Secondary | ICD-10-CM | POA: Diagnosis not present

## 2016-04-06 ENCOUNTER — Other Ambulatory Visit: Payer: Self-pay

## 2016-04-06 MED ORDER — FENTANYL 50 MCG/HR TD PT72: 50.0000 ug | MEDICATED_PATCH | TRANSDERMAL | 0 refills | Status: DC

## 2016-04-06 MED ORDER — OXYCODONE HCL 15 MG PO TABS: 15.0000 mg | ORAL_TABLET | ORAL | 0 refills | Status: DC | PRN

## 2016-04-06 NOTE — Telephone Encounter (Signed)
[name] (do not see DPR) left v/m requesting rx oxycodone (last printed # 180 on 03/08/16) and fentanyl (last printed # 15 on 03/08/16). Pt last saw Dr Darnell Level on 06/18/15; no future appt scheduled.

## 2016-04-06 NOTE — Telephone Encounter (Signed)
Printed and in [facility] bo.x

## 2016-04-06 NOTE — Telephone Encounter (Signed)
Patient's wife notified and Rx's placed up front for pick up.

## 2016-04-19 ENCOUNTER — Other Ambulatory Visit: Payer: Self-pay | Admitting: *Deleted

## 2016-04-19 DIAGNOSIS — I714 Abdominal aortic aneurysm, without rupture, unspecified: Secondary | ICD-10-CM

## 2016-04-26 ENCOUNTER — Ambulatory Visit (INDEPENDENT_AMBULATORY_CARE_PROVIDER_SITE_OTHER): Payer: Medicare PPO | Admitting: Family Medicine

## 2016-04-26 ENCOUNTER — Encounter: Payer: Self-pay | Admitting: Family Medicine

## 2016-04-26 VITALS — BP 130/70 | HR 72 | Temp 98.9°F | Wt 304.2 lb

## 2016-04-26 DIAGNOSIS — G600 Hereditary motor and sensory neuropathy: Secondary | ICD-10-CM

## 2016-04-26 DIAGNOSIS — M1A9XX0 Chronic gout, unspecified, without tophus (tophi): Secondary | ICD-10-CM

## 2016-04-26 DIAGNOSIS — E559 Vitamin D deficiency, unspecified: Secondary | ICD-10-CM

## 2016-04-26 DIAGNOSIS — Z23 Encounter for immunization: Secondary | ICD-10-CM

## 2016-04-26 DIAGNOSIS — I10 Essential (primary) hypertension: Secondary | ICD-10-CM | POA: Diagnosis not present

## 2016-04-26 DIAGNOSIS — K7581 Nonalcoholic steatohepatitis (NASH): Secondary | ICD-10-CM

## 2016-04-26 DIAGNOSIS — I714 Abdominal aortic aneurysm, without rupture, unspecified: Secondary | ICD-10-CM

## 2016-04-26 DIAGNOSIS — E118 Type 2 diabetes mellitus with unspecified complications: Secondary | ICD-10-CM

## 2016-04-26 DIAGNOSIS — E785 Hyperlipidemia, unspecified: Secondary | ICD-10-CM

## 2016-04-26 DIAGNOSIS — G894 Chronic pain syndrome: Secondary | ICD-10-CM

## 2016-04-26 DIAGNOSIS — R1011 Right upper quadrant pain: Secondary | ICD-10-CM | POA: Insufficient documentation

## 2016-04-26 DIAGNOSIS — R109 Unspecified abdominal pain: Secondary | ICD-10-CM | POA: Diagnosis not present

## 2016-04-26 DIAGNOSIS — Z87891 Personal history of nicotine dependence: Secondary | ICD-10-CM

## 2016-04-26 DIAGNOSIS — K7460 Unspecified cirrhosis of liver: Secondary | ICD-10-CM | POA: Diagnosis not present

## 2016-04-26 DIAGNOSIS — M1611 Unilateral primary osteoarthritis, right hip: Secondary | ICD-10-CM

## 2016-04-26 LAB — CBC WITH DIFFERENTIAL/PLATELET
BASOS ABS: 0 10*3/uL (ref 0.0–0.1)
BASOS PCT: 0.4 % (ref 0.0–3.0)
EOS ABS: 0.2 10*3/uL (ref 0.0–0.7)
Eosinophils Relative: 3.2 % (ref 0.0–5.0)
HEMATOCRIT: 32.3 % — AB (ref 39.0–52.0)
HEMOGLOBIN: 10.9 g/dL — AB (ref 13.0–17.0)
LYMPHS PCT: 24.7 % (ref 12.0–46.0)
Lymphs Abs: 1.3 10*3/uL (ref 0.7–4.0)
MCHC: 33.7 g/dL (ref 30.0–36.0)
MCV: 96.4 fl (ref 78.0–100.0)
Monocytes Absolute: 0.3 10*3/uL (ref 0.1–1.0)
Monocytes Relative: 5.7 % (ref 3.0–12.0)
Neutro Abs: 3.4 10*3/uL (ref 1.4–7.7)
Neutrophils Relative %: 66 % (ref 43.0–77.0)
Platelets: 101 10*3/uL — ABNORMAL LOW (ref 150.0–400.0)
RBC: 3.35 Mil/uL — AB (ref 4.22–5.81)
RDW: 15.9 % — ABNORMAL HIGH (ref 11.5–15.5)
WBC: 5.2 10*3/uL (ref 4.0–10.5)

## 2016-04-26 LAB — COMPREHENSIVE METABOLIC PANEL
ALBUMIN: 2.9 g/dL — AB (ref 3.5–5.2)
ALK PHOS: 116 U/L (ref 39–117)
ALT: 14 U/L (ref 0–53)
AST: 21 U/L (ref 0–37)
BILIRUBIN TOTAL: 0.7 mg/dL (ref 0.2–1.2)
BUN: 12 mg/dL (ref 6–23)
CALCIUM: 8.8 mg/dL (ref 8.4–10.5)
CO2: 29 meq/L (ref 19–32)
Chloride: 103 mEq/L (ref 96–112)
Creatinine, Ser: 0.94 mg/dL (ref 0.40–1.50)
GFR: 86.67 mL/min (ref >60.00)
Glucose, Bld: 151 mg/dL — ABNORMAL HIGH (ref 70–99)
Potassium: 4 mEq/L (ref 3.5–5.1)
Sodium: 140 mEq/L (ref 135–145)
Total Protein: 6.7 g/dL (ref 6.0–8.3)

## 2016-04-26 LAB — HEMOGLOBIN A1C: HEMOGLOBIN A1C: 5.9 % (ref 4.6–6.5)

## 2016-04-26 LAB — URIC ACID: URIC ACID, SERUM: 7.2 mg/dL (ref 4.0–7.8)

## 2016-04-26 LAB — LIPASE: LIPASE: 14 U/L (ref 11.0–59.0)

## 2016-04-26 LAB — VITAMIN D 25 HYDROXY (VIT D DEFICIENCY, FRACTURES): VITD: 21.4 ng/mL — AB (ref 30.00–100.00)

## 2016-04-26 LAB — MAGNESIUM: MAGNESIUM: 1.7 mg/dL (ref 1.5–2.5)

## 2016-04-26 NOTE — Assessment & Plan Note (Signed)
Not fasting today - check next visit.

## 2016-04-26 NOTE — Patient Instructions (Addendum)
Flu shot today Labs today We will order abdominal ultrasound for abdominal pain. Rolling walker prescription provided today.  Schedule follow up with Dr Trula Slade if due. Schedule follow up with eye doctor. Return for physical in 3 months.

## 2016-04-26 NOTE — Assessment & Plan Note (Addendum)
Update A1c. Recommend he schedule eye and foot exam. May be able to come off metformin.

## 2016-04-26 NOTE — Assessment & Plan Note (Signed)
Remains abstinent. Continues vaping. Discussed goal taper off all nicotine.

## 2016-04-26 NOTE — Progress Notes (Signed)
Pre visit review using our clinic review tool, if applicable. No additional management support is needed unless otherwise documented below in the visit note. 

## 2016-04-26 NOTE — Progress Notes (Signed)
BP 130/70   Pulse 72   Temp 98.9 F (37.2 C) (Oral)   Wt (!) 304 lb 4 oz (138 kg)   BMI 47.65 kg/m    CC: f/u visit Subjective:    Patient ID: [name], male    DOB: 1955-11-25, 60 y.o.   MRN: [id number]  HPI: [name] is a 60 y.o. male presenting on 04/26/2016 for Follow-up   Last seen by me 06/2015.   Chronic pain syndrome - saw Dr Sanjuan Dame at [facility] pain clinic 05/2015 who rec Fentanyl patch 1mg Q48 hours + oxycodone 156mQ4 hours prn, requested I prescribe.   AAA s/p endovascular repair 11/2015 followed regularly by [facility].   Ongoing abdominal discomfort described as sharp pains on right side of abdomen with radiation to mid abdomen. Has noticed increased indigestion and heartburn. Denies fevers/chills, nausea/vomiting, diarrhea/constipation. Noticing nipple soreness over the past year without skin changes or gynecomastia.   Fall 6 mo ago with ongoing back pain. Has been seeing Dr IbRon Ageeor hip osteoarthritis s/p steroid injection which helped. Also followed by Dr CaFrench Ana  Ex smoker - quit 05/2015, vaping.   Gout - allopurinol allergy (nausea). Could not afford uloric. Has been taking tart cherry supplement with good effect.   Remains unsteady - requests rolling walker. H/o charcot-marie-tooth disease.   Relevant past medical, surgical, family and social history reviewed and updated as indicated. Interim medical history since our last visit reviewed. Allergies and medications reviewed and updated. Current Outpatient Prescriptions on File Prior to Visit  Medication Sig  . albuterol (PROVENTIL HFA;VENTOLIN HFA) 108 (90 Base) MCG/ACT inhaler Inhale 2 puffs into the lungs every 6 (six) hours as needed for wheezing or shortness of breath.  . Marland Kitchenlbuterol (PROVENTIL) (2.5 MG/3ML) 0.083% nebulizer solution USE 1 VIAL PER NEBULIZER EVERY 6 HRS AS NEEDED FOR WHEEZING  . aspirin 325 MG tablet Take 1 tablet (325 mg total) by mouth daily.  . cholecalciferol (VITAMIN  D) 1000 units tablet Take 1,000 Units by mouth daily. Reported on 08/02/2015  . DULoxetine (CYMBALTA) 60 MG capsule TAKE ONE CAPSULE BY MOUTH DAILY  . fentaNYL (DURAGESIC - DOSED MCG/HR) 50 MCG/HR Place 1 patch (50 mcg total) onto the skin every other day.  . fluocinonide cream (LIDEX) 0.0.16 Apply 1 application topically 2 (two) times daily.  . fluticasone (FLONASE) 50 MCG/ACT nasal spray Place 2 sprays into both nostrils daily as needed for allergies.   . folic acid (FOLVITE) 1 MG tablet TAKE 1 TABLET BY MOUTH DAILY  . glucose blood test strip 1 each by Other route as needed for other. Use as instructed to check sugar once daily and as needed. **ONE TOUCH ULTRA** Dx: E11.9  . irbesartan (AVAPRO) 75 MG tablet Take 0.5 tablets (37.5 mg total) by mouth daily.  . metFORMIN (GLUCOPHAGE) 500 MG tablet TAKE 1/2 TABLET BY MOUTH EVERY NIGHT AT BEDTIME.  . metoprolol succinate (TOPROL-XL) 25 MG 24 hr tablet TAKE 1/2 TABLET EVERY DAY  . Misc Natural Products (TART CHERRY ADVANCED) CAPS Take 1 capsule by mouth 2 (two) times daily.  . nitroGLYCERIN (NITROSTAT) 0.4 MG SL tablet Place 1 tablet (0.4 mg total) under the tongue every 5 (five) minutes as needed for chest pain.  . Marland Kitchenystatin (MYCOSTATIN/NYSTOP) 100000 UNIT/GM POWD Apply 20 g topically 2 (two) times daily.  . Marland KitchenxyCODONE (ROXICODONE) 15 MG immediate release tablet Take 1 tablet (15 mg total) by mouth every 4 (four) hours as needed.  . polyethylene glycol powder (GLYCOLAX/MIRALAX)  powder Take 17 g by mouth daily as needed for moderate constipation.  . vitamin B-12 (CYANOCOBALAMIN) 500 MCG tablet Take 500 mcg by mouth daily.   No current facility-administered medications on file prior to visit.     Review of Systems Per HPI unless specifically indicated in ROS section     Objective:    BP 130/70   Pulse 72   Temp 98.9 F (37.2 C) (Oral)   Wt (!) 304 lb 4 oz (138 kg)   BMI 47.65 kg/m   Wt Readings from Last 3 Encounters:  04/26/16 (!) 304 lb  4 oz (138 kg)  01/03/16 (!) 319 lb (144.7 kg)  11/30/15 (!) 314 lb 13.1 oz (142.8 kg)    Physical Exam  Constitutional: He appears well-developed and well-nourished. No distress.  HENT:  Mouth/Throat: Oropharynx is clear and moist. No oropharyngeal exudate.  Eyes: Conjunctivae are normal. Pupils are equal, round, and reactive to light. No scleral icterus.  Neck: Normal range of motion. Neck supple. No thyromegaly present.  Cardiovascular: Normal rate, regular rhythm, normal heart sounds and intact distal pulses.   No murmur heard. Pulmonary/Chest: Effort normal and breath sounds normal. No respiratory distress. He has no wheezes. He has no rales.  Abdominal: Soft. Normal appearance and bowel sounds are normal. He exhibits no distension and no mass. There is tenderness in the right upper quadrant. There is no rigidity, no rebound, no guarding and negative Murphy's sign.  Musculoskeletal: He exhibits no edema.  Lymphadenopathy:    He has no cervical adenopathy.  Skin: Skin is warm and dry. No rash noted.  Psychiatric: He has a normal mood and affect.  Nursing note and vitals reviewed.      Assessment & Plan:   Problem List Items Addressed This Visit    AAA (abdominal aortic aneurysm) ([place])    S/p endovascular repair, followed by [facility] ([facility])      CHARCOT-MARIE-TOOTH DISEASE    Increased unsteadiness noted due to CMT disease as well as end stage hip osteoarthritis - requests rolling walker Rx.       Chronic pain syndrome    F/u scheduled with pain clinic 05/2016.       Diabetes type 2, controlled ([place])    Update A1c. Recommend he schedule eye and foot exam. May be able to come off metformin.      Relevant Orders   Hemoglobin A1c   Essential hypertension (Chronic)    Chronic, stable. Continue current regime.      Relevant Orders   Magnesium   Ex-smoker    Remains abstinent. Continues vaping. Discussed goal taper off all nicotine.       Gout    Allopurinol  intolerance. uloric too expensive. Doing well on tart cherry capsules.       Relevant Orders   Uric acid   HLD (hyperlipidemia)    Not fasting today - check next visit.      Liver cirrhosis secondary to [facility])    Update labs. With new abd pain, check abd [place]      Relevant Orders   Comprehensive metabolic panel   CBC with Differential/Platelet   Obesity, Class III, BMI 40-49.9 (morbid obesity) (HCC)   Osteoarthritis of right hip   Right sided abdominal pain - Primary    Check labs and abd [place] today.       Relevant Orders   US Abdomen Complete   Lipase   Vitamin D deficiency    Update. States he's regularly  taking vit D supplementation.      Relevant Orders   VITAMIN D 25 Hydroxy (Vit-D Deficiency, Fractures)       Follow up plan: Return in about 3 months (around 07/27/2016) for annual exam, prior fasting for blood work.  Ria Bush, MD

## 2016-04-26 NOTE — Assessment & Plan Note (Signed)
Update. States he's regularly taking vit D supplementation.

## 2016-04-26 NOTE — Assessment & Plan Note (Signed)
F/u scheduled with pain clinic 05/2016.

## 2016-04-26 NOTE — Assessment & Plan Note (Signed)
Allopurinol intolerance. uloric too expensive. Doing well on tart cherry capsules.

## 2016-04-26 NOTE — Assessment & Plan Note (Signed)
S/p endovascular repair, followed by [facility] ([facility])

## 2016-04-26 NOTE — Assessment & Plan Note (Signed)
Chronic, stable. Continue current regime.

## 2016-04-26 NOTE — Assessment & Plan Note (Addendum)
Update labs. With new abd pain, check abd [place]

## 2016-04-26 NOTE — Assessment & Plan Note (Signed)
Increased unsteadiness noted due to CMT disease as well as end stage hip osteoarthritis - requests rolling walker Rx.

## 2016-04-26 NOTE — Assessment & Plan Note (Signed)
Check labs and abd [place] today.

## 2016-04-26 NOTE — Addendum Note (Signed)
Addended by: Royann Shivers A on: 04/26/2016 09:54 AM   Modules accepted: Orders

## 2016-04-29 ENCOUNTER — Other Ambulatory Visit: Payer: Self-pay | Admitting: Family Medicine

## 2016-04-29 DIAGNOSIS — D696 Thrombocytopenia, unspecified: Secondary | ICD-10-CM

## 2016-04-29 DIAGNOSIS — E440 Moderate protein-calorie malnutrition: Secondary | ICD-10-CM

## 2016-04-29 DIAGNOSIS — D638 Anemia in other chronic diseases classified elsewhere: Secondary | ICD-10-CM | POA: Insufficient documentation

## 2016-04-29 DIAGNOSIS — K7460 Unspecified cirrhosis of liver: Secondary | ICD-10-CM

## 2016-04-29 DIAGNOSIS — D649 Anemia, unspecified: Secondary | ICD-10-CM

## 2016-04-29 DIAGNOSIS — K7581 Nonalcoholic steatohepatitis (NASH): Principal | ICD-10-CM

## 2016-04-29 DIAGNOSIS — E46 Unspecified protein-calorie malnutrition: Secondary | ICD-10-CM | POA: Insufficient documentation

## 2016-05-02 ENCOUNTER — Other Ambulatory Visit: Payer: Self-pay | Admitting: *Deleted

## 2016-05-02 MED ORDER — OXYCODONE HCL 15 MG PO TABS: 15.0000 mg | ORAL_TABLET | ORAL | 0 refills | Status: DC | PRN

## 2016-05-02 MED ORDER — FENTANYL 50 MCG/HR TD PT72: 50.0000 ug | MEDICATED_PATCH | TRANSDERMAL | 0 refills | Status: DC

## 2016-05-02 NOTE — Telephone Encounter (Signed)
Printed and in [employer]' box.

## 2016-05-02 NOTE — Telephone Encounter (Signed)
Patient's wife left a voicemail requesting refills Fentanyl  Last refill 04/06/16 15 patch Oxycodone  Last refill 04/06/16 #180 Call when ready for pickup

## 2016-05-03 ENCOUNTER — Encounter: Payer: Self-pay | Admitting: *Deleted

## 2016-05-03 NOTE — Telephone Encounter (Signed)
Message left advising patient and Rx's placed up front for pick up.

## 2016-05-05 ENCOUNTER — Ambulatory Visit
Admission: RE | Admit: 2016-05-05 | Discharge: 2016-05-05 | Disposition: A | Discharge status: Home / Self Care | Payer: Medicare PPO | Source: Ambulatory Visit | Attending: Family Medicine | Admitting: Family Medicine

## 2016-05-05 DIAGNOSIS — R109 Unspecified abdominal pain: Secondary | ICD-10-CM

## 2016-05-05 DIAGNOSIS — K7460 Unspecified cirrhosis of liver: Secondary | ICD-10-CM | POA: Diagnosis not present

## 2016-05-08 ENCOUNTER — Encounter: Payer: Self-pay | Admitting: *Deleted

## 2016-05-11 ENCOUNTER — Telehealth: Payer: Self-pay | Admitting: Family Medicine

## 2016-05-11 NOTE — Telephone Encounter (Signed)
Pt called to get ultra sound results.  I let him know the results were mailed 12/4.  He would like to talk to you about results Pt stated he called dr Trula Slade  Vascular surgerion  Dr Trula Slade told pt if he was still hurting to go to the ER.  Pt stated he does hurt sometimes and doesn't want to go to ER.

## 2016-05-11 NOTE — Telephone Encounter (Signed)
Noted  

## 2016-05-11 NOTE — Telephone Encounter (Signed)
Pt called to let you know he received the results today and he would call dr Trula Slade office to let them know the results also

## 2016-05-26 DIAGNOSIS — Z716 Tobacco abuse counseling: Secondary | ICD-10-CM | POA: Diagnosis not present

## 2016-05-26 DIAGNOSIS — Z87891 Personal history of nicotine dependence: Secondary | ICD-10-CM | POA: Diagnosis not present

## 2016-05-26 DIAGNOSIS — Z79891 Long term (current) use of opiate analgesic: Secondary | ICD-10-CM | POA: Diagnosis not present

## 2016-05-26 DIAGNOSIS — M545 Low back pain: Secondary | ICD-10-CM | POA: Diagnosis not present

## 2016-05-26 DIAGNOSIS — Z720 Tobacco use: Secondary | ICD-10-CM | POA: Diagnosis not present

## 2016-05-26 DIAGNOSIS — F17200 Nicotine dependence, unspecified, uncomplicated: Secondary | ICD-10-CM | POA: Diagnosis not present

## 2016-05-26 DIAGNOSIS — M542 Cervicalgia: Secondary | ICD-10-CM | POA: Diagnosis not present

## 2016-05-26 DIAGNOSIS — G894 Chronic pain syndrome: Secondary | ICD-10-CM | POA: Diagnosis not present

## 2016-06-12 ENCOUNTER — Emergency Department (HOSPITAL_COMMUNITY)
Admission: EM | Admit: 2016-06-12 | Discharge: 2016-06-12 | Disposition: A | Discharge status: Home / Self Care | Payer: Medicare PPO | Attending: Emergency Medicine | Admitting: Emergency Medicine

## 2016-06-12 ENCOUNTER — Emergency Department (HOSPITAL_COMMUNITY): Payer: Medicare PPO

## 2016-06-12 ENCOUNTER — Encounter (HOSPITAL_COMMUNITY): Payer: Self-pay | Admitting: Emergency Medicine

## 2016-06-12 DIAGNOSIS — Z7982 Long term (current) use of aspirin: Secondary | ICD-10-CM | POA: Diagnosis not present

## 2016-06-12 DIAGNOSIS — I2510 Atherosclerotic heart disease of native coronary artery without angina pectoris: Secondary | ICD-10-CM | POA: Diagnosis not present

## 2016-06-12 DIAGNOSIS — J449 Chronic obstructive pulmonary disease, unspecified: Secondary | ICD-10-CM | POA: Insufficient documentation

## 2016-06-12 DIAGNOSIS — R791 Abnormal coagulation profile: Secondary | ICD-10-CM | POA: Diagnosis not present

## 2016-06-12 DIAGNOSIS — R112 Nausea with vomiting, unspecified: Secondary | ICD-10-CM

## 2016-06-12 DIAGNOSIS — R109 Unspecified abdominal pain: Secondary | ICD-10-CM

## 2016-06-12 DIAGNOSIS — I252 Old myocardial infarction: Secondary | ICD-10-CM | POA: Insufficient documentation

## 2016-06-12 DIAGNOSIS — R1084 Generalized abdominal pain: Secondary | ICD-10-CM | POA: Diagnosis not present

## 2016-06-12 DIAGNOSIS — R197 Diarrhea, unspecified: Secondary | ICD-10-CM | POA: Diagnosis not present

## 2016-06-12 DIAGNOSIS — I723 Aneurysm of iliac artery: Secondary | ICD-10-CM | POA: Diagnosis not present

## 2016-06-12 DIAGNOSIS — E119 Type 2 diabetes mellitus without complications: Secondary | ICD-10-CM | POA: Diagnosis not present

## 2016-06-12 DIAGNOSIS — Z7984 Long term (current) use of oral hypoglycemic drugs: Secondary | ICD-10-CM | POA: Insufficient documentation

## 2016-06-12 DIAGNOSIS — F17210 Nicotine dependence, cigarettes, uncomplicated: Secondary | ICD-10-CM | POA: Insufficient documentation

## 2016-06-12 DIAGNOSIS — K2970 Gastritis, unspecified, without bleeding: Secondary | ICD-10-CM | POA: Diagnosis not present

## 2016-06-12 DIAGNOSIS — K7460 Unspecified cirrhosis of liver: Secondary | ICD-10-CM | POA: Diagnosis not present

## 2016-06-12 DIAGNOSIS — I10 Essential (primary) hypertension: Secondary | ICD-10-CM | POA: Insufficient documentation

## 2016-06-12 LAB — CBC WITH DIFFERENTIAL/PLATELET
BASOS ABS: 0 10*3/uL (ref 0.0–0.1)
Basophils Relative: 0 %
EOS ABS: 0.1 10*3/uL (ref 0.0–0.7)
EOS PCT: 1 %
HCT: 34.1 % — ABNORMAL LOW (ref 39.0–52.0)
Hemoglobin: 11 g/dL — ABNORMAL LOW (ref 13.0–17.0)
Lymphocytes Relative: 11 %
Lymphs Abs: 0.6 10*3/uL — ABNORMAL LOW (ref 0.7–4.0)
MCH: 31.1 pg (ref 26.0–34.0)
MCHC: 32.3 g/dL (ref 30.0–36.0)
MCV: 96.3 fL (ref 78.0–100.0)
Monocytes Absolute: 0.2 10*3/uL (ref 0.1–1.0)
Monocytes Relative: 3 %
Neutro Abs: 4.9 10*3/uL (ref 1.7–7.7)
Neutrophils Relative %: 85 %
PLATELETS: 66 10*3/uL — AB (ref 150–400)
RBC: 3.54 MIL/uL — AB (ref 4.22–5.81)
RDW: 15.2 % (ref 11.5–15.5)
WBC: 5.8 10*3/uL (ref 4.0–10.5)

## 2016-06-12 LAB — TYPE AND SCREEN
ABO/RH(D): O POS
ANTIBODY SCREEN: NEGATIVE

## 2016-06-12 LAB — PROTIME-INR
INR: 1.25
PROTHROMBIN TIME: 15.7 s — AB (ref 11.4–15.2)

## 2016-06-12 LAB — COMPREHENSIVE METABOLIC PANEL
ALT: 17 U/L (ref 17–63)
AST: 40 U/L (ref 15–41)
Albumin: 2.5 g/dL — ABNORMAL LOW (ref 3.5–5.0)
Alkaline Phosphatase: 123 U/L (ref 38–126)
Anion gap: 8 (ref 5–15)
BILIRUBIN TOTAL: 1.2 mg/dL (ref 0.3–1.2)
BUN: 11 mg/dL (ref 6–20)
CALCIUM: 8.2 mg/dL — AB (ref 8.9–10.3)
CO2: 22 mmol/L (ref 22–32)
CREATININE: 0.73 mg/dL (ref 0.61–1.24)
Chloride: 103 mmol/L (ref 101–111)
GFR calc Af Amer: >60 mL/min (ref >60)
Glucose, Bld: 136 mg/dL — ABNORMAL HIGH (ref 65–99)
Potassium: 4.2 mmol/L (ref 3.5–5.1)
Sodium: 133 mmol/L — ABNORMAL LOW (ref 135–145)
TOTAL PROTEIN: 7 g/dL (ref 6.5–8.1)

## 2016-06-12 LAB — URINALYSIS, ROUTINE W REFLEX MICROSCOPIC
BACTERIA UA: NONE SEEN
BILIRUBIN URINE: NEGATIVE
Glucose, UA: NEGATIVE mg/dL
Hgb urine dipstick: NEGATIVE
Ketones, ur: NEGATIVE mg/dL
LEUKOCYTES UA: NEGATIVE
Nitrite: NEGATIVE
PROTEIN: 30 mg/dL — AB
SPECIFIC GRAVITY, URINE: 1.044 — AB (ref 1.005–1.030)
pH: 6 (ref 5.0–8.0)

## 2016-06-12 LAB — POC OCCULT BLOOD, ED: Fecal Occult Bld: POSITIVE — AB

## 2016-06-12 LAB — LIPASE, BLOOD: Lipase: 19 U/L (ref 11–51)

## 2016-06-12 LAB — APTT: APTT: 33 s (ref 24–36)

## 2016-06-12 MED ORDER — MORPHINE SULFATE (PF) 4 MG/ML IV SOLN
4.0000 mg | Freq: Once | INTRAVENOUS | Status: AC
  Administered 2016-06-12: 4 mg via INTRAVENOUS
  Filled 2016-06-12: qty 1

## 2016-06-12 MED ORDER — ONDANSETRON 4 MG PO TBDP: 4.0000 mg | ORAL_TABLET | Freq: Three times a day (TID) | ORAL | 0 refills | Status: DC | PRN

## 2016-06-12 MED ORDER — SODIUM CHLORIDE 0.9 % IV SOLN
INTRAVENOUS | Status: DC
  Administered 2016-06-12: 02:00:00 via INTRAVENOUS

## 2016-06-12 MED ORDER — DICYCLOMINE HCL 20 MG PO TABS: 20.0000 mg | ORAL_TABLET | Freq: Three times a day (TID) | ORAL | 0 refills | Status: DC

## 2016-06-12 MED ORDER — LOPERAMIDE HCL 2 MG PO CAPS: 2.0000 mg | ORAL_CAPSULE | Freq: Four times a day (QID) | ORAL | 0 refills | Status: DC | PRN

## 2016-06-12 MED ORDER — ACETAMINOPHEN 500 MG PO TABS
1000.0000 mg | ORAL_TABLET | Freq: Once | ORAL | Status: AC
  Administered 2016-06-12: 1000 mg via ORAL
  Filled 2016-06-12: qty 2

## 2016-06-12 MED ORDER — ONDANSETRON HCL 4 MG/2ML IJ SOLN
4.0000 mg | Freq: Once | INTRAMUSCULAR | Status: AC
  Administered 2016-06-12: 4 mg via INTRAVENOUS
  Filled 2016-06-12: qty 2

## 2016-06-12 MED ORDER — IOPAMIDOL (ISOVUE-370) INJECTION 76%
INTRAVENOUS | Status: AC
  Administered 2016-06-12: 100 mL
  Filled 2016-06-12: qty 100

## 2016-06-12 NOTE — ED Provider Notes (Signed)
By signing my name below, I, Jeanell Sparrow, attest that this documentation has been prepared under the direction and in the presence of [place], DO . Electronically Signed: Jeanell Sparrow, Scribe. 06/12/2016. 1:11 AM.  TIME SEEN: 1:11 AM  CHIEF COMPLAINT: GI Bleeding   HPI:  HPI Comments: [name] is a 61 y.o. male who presents to the Emergency Department complaining of constant moderate diffuse abdominal pain that started about a week ago. He had an abdominal aortic aneurysm repair done in 11/30/2015 by Dr. Trula Slade. He states he came to the ED today for his associated persistent vomiting with associated nausea onset today. He describes the emesis as "dark brown". States he is also had "brown black stools". No hematochezia. No vomiting bright red blood. No modifying factors. He is currently on anticoagulation but is on aspirin. He denies any recent use of pepto-bismol, use of iron tablets, hx of GI bleeding, blood in stool, or other complaints. No chest pain or shortness of breath.   PCP: Ria Bush, MD     ROS: See HPI Constitutional: no fever  Eyes: no drainage  ENT: no runny nose   Cardiovascular:  no chest pain  Resp: no SOB  GI: no vomiting GU: no dysuria Integumentary: no rash  Allergy: no hives  Musculoskeletal: no leg swelling  Neurological: no slurred speech ROS otherwise negative  PAST MEDICAL HISTORY/PAST SURGICAL HISTORY:  Past Medical History:  Diagnosis Date  . AAA (abdominal aortic aneurysm) ([place]) 09/2012--  monitored by dr Trula Slade   stable 5.6cm CTA abdomen 2016  . Allergic rhinitis   . B12 deficiency   . CAD (coronary artery disease) cardiologist-  dr Stanford Breed   x3 with stents last 2005, EF 40%, predominantly RCA by CT 2016  . Cataracts, bilateral   . Cervical spondylosis 05/2010   s/p surgery  . Charcot Marie Tooth muscular atrophy dx  [phone]   neurologist--  dr love--  type 2 per pt  . Chronic pain syndrome    established with  Preferred pain clinic ([place]) --> disagreement and transfered care to Dr Sanjuan Dame at [facility], requests PCP write Rx but f/u with pain clinic Q6-12 months  . COPD (chronic obstructive pulmonary disease) ([place]) 10/2011   minimal by PFTs  . DDD (degenerative disc disease)   . Disturbances of sensation of smell and taste    improving  . Dyspnea on exertion   . GERD (gastroesophageal reflux disease)   . Gout   . Headache   . Hepatitis    hepatitis B  . Hidradenitis    right groin  . Hidradenitis suppurativa dx 2011   goin and leg crease   followd by Lyndle Herrlich - daily bactrim, s/p intralesional steroid injection 10/2010  . Hip osteoarthritis    s/p intraarticular steroid shot (12/2012) ([place])  . History of hepatitis B 1983  . History of MI (myocardial infarction)    2000  &  2005  . History of pneumonia   . History of viral meningitis 2000  . HLD (hyperlipidemia)   . HTN (hypertension)   . Ischemic cardiomyopathy    s/p inferior MI  --  current ef per myoview 39%  . Liver cirrhosis secondary to NASH ([place]) 01/2014   by CT scan, rec virtual colonoscopy by Dr Collene Mares 06/2014  . Lumbar herniated disc   . Myocardial infarction    x2  . Nocturia more than twice per night   . Obesity   . Spinal  stenosis    released from [place].  established with preferred pain (07/2013)  . T2DM (type 2 diabetes mellitus) ([place])    ABIs WNL 2016  . Vitamin D deficiency     MEDICATIONS:  Prior to Admission medications   Medication Sig Start Date End Date Taking? Authorizing Provider  albuterol (PROVENTIL HFA;VENTOLIN HFA) 108 (90 Base) MCG/ACT inhaler Inhale 2 puffs into the lungs every 6 (six) hours as needed for wheezing or shortness of breath. 08/17/15   Jearld Fenton, NP  albuterol (PROVENTIL) (2.5 MG/3ML) 0.083% nebulizer solution USE 1 VIAL PER NEBULIZER EVERY 6 HRS AS NEEDED FOR WHEEZING 07/19/15   Ria Bush, MD  aspirin 325 MG tablet Take 1 tablet (325 mg total) by mouth  daily. 07/19/15   Ria Bush, MD  cholecalciferol 2000 units TABS Take 2,000 Units by mouth daily. 04/29/16   Ria Bush, MD  DULoxetine (CYMBALTA) 60 MG capsule TAKE ONE CAPSULE BY MOUTH DAILY 05/21/15   Ria Bush, MD  fentaNYL (DURAGESIC - DOSED MCG/HR) 50 MCG/HR Place 1 patch (50 mcg total) onto the skin every other day. 05/02/16   Ria Bush, MD  fluocinonide cream (LIDEX) 3.61 % Apply 1 application topically 2 (two) times daily.    Historical Provider, MD  fluticasone (FLONASE) 50 MCG/ACT nasal spray Place 2 sprays into both nostrils daily as needed for allergies.  02/09/12   Ria Bush, MD  folic acid (FOLVITE) 1 MG tablet TAKE 1 TABLET BY MOUTH DAILY 12/24/15   Ria Bush, MD  glucose blood test strip 1 each by Other route as needed for other. Use as instructed to check sugar once daily and as needed. **ONE TOUCH ULTRA** Dx: E11.9 04/05/15   Ria Bush, MD  irbesartan (AVAPRO) 75 MG tablet Take 0.5 tablets (37.5 mg total) by mouth daily. 04/09/15   Ria Bush, MD  metFORMIN (GLUCOPHAGE) 500 MG tablet TAKE 1/2 TABLET BY MOUTH EVERY NIGHT AT BEDTIME. 07/12/15   Ria Bush, MD  metoprolol succinate (TOPROL-XL) 25 MG 24 hr tablet TAKE 1/2 TABLET EVERY DAY 04/09/15   Ria Bush, MD  Misc Natural Products [facility] ADVANCED) CAPS Take 1 capsule by mouth 2 (two) times daily.    Historical Provider, MD  nitroGLYCERIN (NITROSTAT) 0.4 MG SL tablet Place 1 tablet (0.4 mg total) under the tongue every 5 (five) minutes as needed for chest pain. 10/10/12   Lelon Perla, MD  nystatin (MYCOSTATIN/NYSTOP) 100000 UNIT/GM POWD Apply 20 g topically 2 (two) times daily.    Historical Provider, MD  oxyCODONE (ROXICODONE) 15 MG immediate release tablet Take 1 tablet (15 mg total) by mouth every 4 (four) hours as needed. 05/02/16   Ria Bush, MD  polyethylene glycol powder (GLYCOLAX/MIRALAX) powder Take 17 g by mouth daily as needed for moderate  constipation. 01/15/14   Ria Bush, MD  vitamin B-12 (CYANOCOBALAMIN) 500 MCG tablet Take 500 mcg by mouth daily.    Historical Provider, MD    ALLERGIES:  Allergies  Allergen Reactions  . Statins Shortness Of Breath    Cough, trouble breathing  . Losartan Other (See Comments)    Causes him to have pain  . Allopurinol Nausea Only  . Baclofen Nausea And Vomiting  . Gabapentin Nausea And Vomiting    SOCIAL HISTORY:  Social History  Substance Use Topics  . Smoking status: Current Every Day Smoker    Packs/day: 1.00    Years: 57.00    Types: Cigarettes    Start date: 06/06/1967  . Smokeless  tobacco: Never Used     Comment: stopped smoking a pipe in 2015  DOES SMOKE E CIG  . Alcohol use No    FAMILY HISTORY: Family History  Problem Relation Age of Onset  . Cancer Mother     colon  . Diabetes Mother   . Kidney disease Mother   . Aneurysm Mother     AAA  . Rheum arthritis Mother   . Charcot-Marie-Tooth disease Mother   . Heart disease Mother     before age 62  . Cancer Father     skin  . Heart attack Father   . Heart disease Father     before age 26  . Cancer Brother     skin  . Coronary artery disease Brother   . Cancer Brother     small cell lung cancer  . Aneurysm Brother     AAA  . Rheum arthritis Sister   . Rheum arthritis Brother     EXAM: BP 101/70   Pulse 95   Resp 21   SpO2 98%  CONSTITUTIONAL: Alert and oriented and responds appropriately to questions. Morbidly obese HEAD: Normocephalic EYES: Conjunctivae clear, PERRL, EOMI, No conjunctival pallor ENT: normal nose; no rhinorrhea; dry mucous membranes NECK: Supple, no meningismus, no nuchal rigidity, no LAD  CARD: RRR; S1 and S2 appreciated; no murmurs, no clicks, no rubs, no gallops RESP: Normal chest excursion without splinting or tachypnea; breath sounds clear and equal bilaterally; no wheezes, no rhonchi, no rales, no hypoxia or respiratory distress, speaking full sentences ABD/GI:  Normal bowel sounds; non-distended; soft, Mildly tender throughout the abdomen, no rebound, no guarding, no peritoneal signs, no hepatosplenomegaly RECTAL:  Normal rectal tone, no gross blood or melena, guaiac positive, no hemorrhoids appreciated, nontender rectal exam, no fecal impaction; soft brown stool in the rectal vault BACK:  The back appears normal and is non-tender to palpation, there is no CVA tenderness EXT: Normal ROM in all joints; non-tender to palpation; no edema; normal capillary refill; no cyanosis, no calf tenderness or swelling    SKIN: Normal color for age and race; warm; no rash NEURO: Moves all extremities equally, sensation to light touch intact diffusely, cranial nerves II through XII intact, normal speech PSYCH: The patient's mood and manner are appropriate. Grooming and personal hygiene are appropriate.   MEDICAL DECISION MAKING: Patient here with complaints of abdominal pain. Has history of AAA repair in June. Also complaining of seeing coffee-ground emesis and "brown black" stools today. No melena or hematochezia on exam. He is minimally guaiac positive. We'll obtain CT scan to further evaluate his aortic aneurysm repair and to ensure there is no sign of fistula. We'll give morphine, Zofran and reassess. We'll monitor him for any GI bleeding in the emergency department. Hemodynamically stable.  ED PROGRESS: 3:50 AM  Pt's labs are unremarkable. Hemoglobin stable. No leukocytosis. Normal LFTs and lipase. CT scan shows no significant change in his aorta. Stent appears patent. No fistula present. Does have some increased infiltration in the upper abdominal fat extending into the pericolic gutters more prominent on the right side which could be postoperative change versus infection or inflammatory process. Doubt pancreatitis given lipase is normal. Could be duodenitis versus cholecystitis. Will obtain right upper quadrant ultrasound for further violation given he is tender in this  area. He is still not had any bloody bowel movements or vomiting in the emergency department. Suspicion for true GI bleed is very low.  6:00 AM  Pt's right upper  quadrant ultrasound is normal. He does have a temperature 100.1. I suspect that he has a viral gastroenteritis causing his symptoms. No bowel movements here in the emergency department and no vomiting. Again my suspicion that this is a GI bleed is low. Minimally guaiac positive but no gross blood or melena on exam. He has been hemodynamically stable. Urine shows no sign of infection. I feel he is safe to be discharged home with close PCP follow-up. We'll discharge with prescriptions for Bentyl, Zofran and Imodium. Discussed return precautions. Patient and family are comfortable with this plan.  At this time, I do not feel there is any life-threatening condition present. I have reviewed and discussed all results (EKG, imaging, lab, urine as appropriate) and exam findings with patient/family. I have reviewed nursing notes and appropriate previous records.  I feel the patient is safe to be discharged home without further emergent workup and can continue workup as an outpatient as needed. Discussed usual and customary return precautions. Patient/family verbalize understanding and are comfortable with this plan.  Outpatient follow-up has been provided. All questions have been answered.   I personally performed the services described in this documentation, which was scribed in my presence. The recorded information has been reviewed and is accurate.     [place], DO 06/12/16 0600

## 2016-06-12 NOTE — ED Triage Notes (Signed)
Pt BIB EMS with complaint of RUQ and RLQ abdominal pain x 1 week. Also has had black/tarry diarrhea and black vomit x 1 week.  Distended abdomen per pt.  History of AAA with repair in June.  BP on the right 112/68.  BP on the left 132/82   No chest pain.

## 2016-06-16 ENCOUNTER — Ambulatory Visit: Payer: Medicare PPO | Admitting: Family Medicine

## 2016-06-19 ENCOUNTER — Other Ambulatory Visit: Payer: Self-pay | Admitting: *Deleted

## 2016-06-19 MED ORDER — OXYCODONE HCL 15 MG PO TABS: 15.0000 mg | ORAL_TABLET | ORAL | 0 refills | Status: DC | PRN

## 2016-06-19 MED ORDER — FENTANYL 50 MCG/HR TD PT72: 50.0000 ug | MEDICATED_PATCH | TRANSDERMAL | 0 refills | Status: DC

## 2016-06-19 NOTE — Telephone Encounter (Signed)
Wife left voicemail at Triage requesting refill of meds, fentanyl last filled on 05/02/16 #15 patches with 0 refills, oxycodone last filled on 05/02/16 #180 tabs with 0 refills

## 2016-06-19 NOTE — Telephone Encounter (Signed)
Printed and in [name] box.

## 2016-06-20 NOTE — Telephone Encounter (Signed)
Message left advising patient's wife and Rx's placed up front for pick up.

## 2016-06-22 ENCOUNTER — Ambulatory Visit: Payer: Medicare PPO | Admitting: Family Medicine

## 2016-06-28 ENCOUNTER — Encounter: Payer: Self-pay | Admitting: Family Medicine

## 2016-06-28 ENCOUNTER — Ambulatory Visit (INDEPENDENT_AMBULATORY_CARE_PROVIDER_SITE_OTHER): Payer: Medicare PPO | Admitting: Family Medicine

## 2016-06-28 ENCOUNTER — Encounter (INDEPENDENT_AMBULATORY_CARE_PROVIDER_SITE_OTHER): Payer: Self-pay

## 2016-06-28 VITALS — BP 136/86 | HR 104 | Temp 99.6°F | Wt 308.5 lb

## 2016-06-28 DIAGNOSIS — D696 Thrombocytopenia, unspecified: Secondary | ICD-10-CM

## 2016-06-28 DIAGNOSIS — E440 Moderate protein-calorie malnutrition: Secondary | ICD-10-CM

## 2016-06-28 DIAGNOSIS — R112 Nausea with vomiting, unspecified: Secondary | ICD-10-CM

## 2016-06-28 DIAGNOSIS — G600 Hereditary motor and sensory neuropathy: Secondary | ICD-10-CM | POA: Diagnosis not present

## 2016-06-28 DIAGNOSIS — G8929 Other chronic pain: Secondary | ICD-10-CM

## 2016-06-28 DIAGNOSIS — R109 Unspecified abdominal pain: Secondary | ICD-10-CM

## 2016-06-28 DIAGNOSIS — E118 Type 2 diabetes mellitus with unspecified complications: Secondary | ICD-10-CM

## 2016-06-28 DIAGNOSIS — K7460 Unspecified cirrhosis of liver: Secondary | ICD-10-CM

## 2016-06-28 DIAGNOSIS — I714 Abdominal aortic aneurysm, without rupture, unspecified: Secondary | ICD-10-CM

## 2016-06-28 DIAGNOSIS — K7581 Nonalcoholic steatohepatitis (NASH): Secondary | ICD-10-CM

## 2016-06-28 DIAGNOSIS — G894 Chronic pain syndrome: Secondary | ICD-10-CM

## 2016-06-28 MED ORDER — ONDANSETRON 4 MG PO TBDP: 4.0000 mg | ORAL_TABLET | Freq: Three times a day (TID) | ORAL | 0 refills | Status: DC | PRN

## 2016-06-28 MED ORDER — OMEPRAZOLE 40 MG PO CPDR: 40.0000 mg | DELAYED_RELEASE_CAPSULE | Freq: Every day | ORAL | 3 refills | Status: DC

## 2016-06-28 NOTE — Assessment & Plan Note (Signed)
With ongoing abd discomfort, nausea/vomiting. States he has not had EGD. Will start omeprazole 10m daily and refer to GI for further evaluation. Filled zofran.

## 2016-06-28 NOTE — Progress Notes (Signed)
BP 136/86   Pulse (!) 104   Temp 99.6 F (37.6 C) (Oral)   Wt (!) 308 lb 8 oz (139.9 kg)   BMI 48.32 kg/m    CC: ER f/u visit Subjective:    Patient ID: [name], male    DOB: Nov 02, 1955, 61 y.o.   MRN: [id number]  HPI: [name] is a 61 y.o. male presenting on 06/28/2016 for Follow-up (ER)   Recent ER evaluation 18/2018, records reviewed. Brought in by EMS with abdominal pain of 1 wk duration associated with vomiting coffee ground emesis and nausea, black stools, concern for GI bleed. Guaiac positive. CT scan and RUQ abd [place] were reassuring, as were labs. Thought viral gastroenteritis, treated with bentyl, zofran, imodium.   Alb low, calcium low, sodium mildly low. WBC 5.8, Hgb 11 (stable).   Continues feeling ill - intermittently nauseated, vomiting dark red emesis "looks like hamburger", abdominal discomfort. No bowel changes, diarrhea or constipation. No blood in stool. No recent fevers. Never diarrhea.   S/p AAA repair 11/2015.  Needs appt next month for physical.  Continues seeing Dr Sanjuan Dame pain management - pt states he recommended increasing oxycodone to #240/month. I said I won't prescribe this amount of narcotic.  Sees Dr Ron Agee and has received ESI.   Patient stopped metformin on his own.   Relevant past medical, surgical, family and social history reviewed and updated as indicated. Interim medical history since our last visit reviewed. Allergies and medications reviewed and updated. Current Outpatient Prescriptions on File Prior to Visit  Medication Sig  . albuterol (PROVENTIL HFA;VENTOLIN HFA) 108 (90 Base) MCG/ACT inhaler Inhale 2 puffs into the lungs every 6 (six) hours as needed for wheezing or shortness of breath.  [name] albuterol (PROVENTIL) (2.5 MG/3ML) 0.083% nebulizer solution USE 1 VIAL PER NEBULIZER EVERY 6 HRS AS NEEDED FOR WHEEZING  . aspirin 325 MG tablet Take 1 tablet (325 mg total) by mouth daily.  . cholecalciferol 2000 units TABS Take  2,000 Units by mouth daily.  [name] dicyclomine (BENTYL) 20 MG tablet Take 1 tablet (20 mg total) by mouth 3 (three) times daily before meals. As needed for abdominal pain  . DULoxetine (CYMBALTA) 60 MG capsule TAKE ONE CAPSULE BY MOUTH DAILY  . fentaNYL (DURAGESIC - DOSED MCG/HR) 50 MCG/HR Place 1 patch (50 mcg total) onto the skin every other day.  . fluocinonide cream (LIDEX) 1.91 % Apply 1 application topically 2 (two) times daily.  . fluticasone (FLONASE) 50 MCG/ACT nasal spray Place 2 sprays into both nostrils daily as needed for allergies.   . folic acid (FOLVITE) 1 MG tablet TAKE 1 TABLET BY MOUTH DAILY  . glucose blood test strip 1 each by Other route as needed for other. Use as instructed to check sugar once daily and as needed. **ONE TOUCH ULTRA** Dx: E11.9  . irbesartan (AVAPRO) 75 MG tablet Take 0.5 tablets (37.5 mg total) by mouth daily.  [name] loperamide (IMODIUM) 2 MG capsule Take 1 capsule (2 mg total) by mouth 4 (four) times daily as needed for diarrhea or loose stools.  . metoprolol succinate (TOPROL-XL) 25 MG 24 hr tablet TAKE 1/2 TABLET EVERY DAY  . Misc Natural Products ([employer] ADVANCED) CAPS Take 1 capsule by mouth 2 (two) times daily.  . nitroGLYCERIN (NITROSTAT) 0.4 MG SL tablet Place 1 tablet (0.4 mg total) under the tongue every 5 (five) minutes as needed for chest pain.  [name] nystatin (MYCOSTATIN/NYSTOP) 100000 UNIT/GM POWD Apply 20 g topically 2 (  two) times daily.  [name] oxyCODONE (ROXICODONE) 15 MG immediate release tablet Take 1 tablet (15 mg total) by mouth every 4 (four) hours as needed.  . polyethylene glycol powder (GLYCOLAX/MIRALAX) powder Take 17 g by mouth daily as needed for moderate constipation.  . vitamin B-12 (CYANOCOBALAMIN) 500 MCG tablet Take 500 mcg by mouth daily.   No current facility-administered medications on file prior to visit.     Review of Systems Per HPI unless specifically indicated in ROS section     Objective:    BP 136/86   Pulse (!) 104    Temp 99.6 F (37.6 C) (Oral)   Wt (!) 308 lb 8 oz (139.9 kg)   BMI 48.32 kg/m   Wt Readings from Last 3 Encounters:  06/28/16 (!) 308 lb 8 oz (139.9 kg)  04/26/16 (!) 304 lb 4 oz (138 kg)  01/03/16 (!) 319 lb (144.7 kg)    Physical Exam  Constitutional: He appears well-developed and well-nourished. No distress.  HENT:  Mouth/Throat: Oropharynx is clear and moist. No oropharyngeal exudate.  Cardiovascular: Normal rate, regular rhythm, normal heart sounds and intact distal pulses.   No murmur heard. Pulmonary/Chest: Effort normal and breath sounds normal. No respiratory distress. He has no wheezes. He has no rales.  Abdominal: Soft. Normal appearance and bowel sounds are normal. He exhibits no distension and no mass. There is no hepatosplenomegaly. There is tenderness (mild) in the right upper quadrant. There is no rigidity, no rebound, no guarding and negative Murphy's sign.  Obese abdomen  Musculoskeletal: He exhibits no edema.  Skin: Skin is warm and dry. No rash noted.  Psychiatric: He has a normal mood and affect.  Nursing note and vitals reviewed.  Results for orders placed or performed during the hospital encounter of 06/12/16  Comprehensive metabolic panel  Result Value Ref Range   Sodium 133 (L) 135 - 145 mmol/L   Potassium 4.2 3.5 - 5.1 mmol/L   Chloride 103 101 - 111 mmol/L   CO2 22 22 - 32 mmol/L   Glucose, Bld 136 (H) 65 - 99 mg/dL   BUN 11 6 - 20 mg/dL   Creatinine, Ser 0.73 0.61 - 1.24 mg/dL   Calcium 8.2 (L) 8.9 - 10.3 mg/dL   Total Protein 7.0 6.5 - 8.1 g/dL   Albumin 2.5 (L) 3.5 - 5.0 g/dL   AST 40 15 - 41 U/L   ALT 17 17 - 63 U/L   Alkaline Phosphatase 123 38 - 126 U/L   Total Bilirubin 1.2 0.3 - 1.2 mg/dL   GFR calc non Af Amer >60 >60 mL/min   GFR calc Af Amer >60 >60 mL/min   Anion gap 8 5 - 15  CBC WITH DIFFERENTIAL  Result Value Ref Range   WBC 5.8 4.0 - 10.5 K/uL   RBC 3.54 (L) 4.22 - 5.81 MIL/uL   Hemoglobin 11.0 (L) 13.0 - 17.0 g/dL   HCT  34.1 (L) 39.0 - 52.0 %   MCV 96.3 78.0 - 100.0 fL   MCH 31.1 26.0 - 34.0 pg   MCHC 32.3 30.0 - 36.0 g/dL   RDW 15.2 11.5 - 15.5 %   Platelets 66 (L) 150 - 400 K/uL   Neutrophils Relative % 85 %   Neutro Abs 4.9 1.7 - 7.7 K/uL   Lymphocytes Relative 11 %   Lymphs Abs 0.6 (L) 0.7 - 4.0 K/uL   Monocytes Relative 3 %   Monocytes Absolute 0.2 0.1 - 1.0 K/uL   Eosinophils  Relative 1 %   Eosinophils Absolute 0.1 0.0 - 0.7 K/uL   Basophils Relative 0 %   Basophils Absolute 0.0 0.0 - 0.1 K/uL  Lipase, blood  Result Value Ref Range   Lipase 19 11 - 51 U/L  APTT  Result Value Ref Range   aPTT 33 24 - 36 seconds  Protime-INR  Result Value Ref Range   Prothrombin Time 15.7 (H) 11.4 - 15.2 seconds   INR 1.25   Urinalysis, Routine w reflex microscopic  Result Value Ref Range   Color, Urine YELLOW YELLOW   APPearance CLEAR CLEAR   Specific Gravity, Urine 1.044 (H) 1.005 - 1.030   pH 6.0 5.0 - 8.0   Glucose, UA NEGATIVE NEGATIVE mg/dL   Hgb urine dipstick NEGATIVE NEGATIVE   Bilirubin Urine NEGATIVE NEGATIVE   Ketones, ur NEGATIVE NEGATIVE mg/dL   Protein, ur 30 (A) NEGATIVE mg/dL   Nitrite NEGATIVE NEGATIVE   Leukocytes, UA NEGATIVE NEGATIVE   RBC / HPF 0-5 0 - 5 RBC/hpf   WBC, UA 0-5 0 - 5 WBC/hpf   Bacteria, UA NONE SEEN NONE SEEN   Squamous Epithelial / LPF 0-5 (A) NONE SEEN   Mucous PRESENT   POC occult blood, ED  Result Value Ref Range   Fecal Occult Bld POSITIVE (A) NEGATIVE  Type and screen  Result Value Ref Range   ABO/RH(D) O POS    Antibody Screen NEG    Sample Expiration 06/15/2016    Lab Results  Component Value Date   HGBA1C 5.9 04/26/2016    CT ANGIOGRAPHY CHEST, ABDOMEN AND PELVIS CTA CHEST FINDINGS CTA ABDOMEN AND PELVIS FINDINGS IMPRESSION: Postoperative aorta iliac aneurysm repair with bilateral renal artery stents. Stents appear patent. Native aneurysm sac is not enlarging. Since the prior study, there is increased infiltration in the upper abdominal  fat, extending to the pericolic gutters more prominently on the right. This could represent postoperative change or infectious or inflammatory process. No significant retroperitoneal hematoma or contrast extravasation to suggest aortic rupture. Findings may indicate pancreatitis, duodenitis, or cholecystitis. Changes of hepatic cirrhosis with probable reactive upper abdominal lymph nodes, mild splenic enlargement, and small ascites. Small right pleural effusion with basilar atelectasis. Electronically Signed   By: Lucienne Capers M.D.   On: 06/12/2016 03:42    Assessment & Plan:   Problem List Items Addressed This Visit    AAA (abdominal aortic aneurysm) ([place])    S/p endovascular repair, followed by [facility], appt next month with CTA.       CHARCOT-MARIE-TOOTH DISEASE    Ongoing pain, unsteadiness.  Did not use ambulatory assistive device today      Chronic pain syndrome    Advised I would not prescribe #240 oxycodone 42m per month. Pt states Dr DSanjuan Damewill call me to discuss.  Update UDS today.       Diabetes type 2, controlled ([place]    Pt has self stopped metformin.  Will check A1c and fructosamine next labwork.      Encounter for chronic pain management    Update UDS.       Liver cirrhosis secondary to NASH ([facility]    With ongoing abd discomfort, nausea/vomiting. States he has not had EGD. Will start omeprazole 476mdaily and refer to GI for further evaluation. Filled zofran.       Relevant Orders   Ambulatory referral to Gastroenterology   Obesity, Class III, BMI 40-49.9 (morbid obesity) ([facility])   Protein-calorie malnutrition ([facility]  Right  sided abdominal pain    Reviewed recent labs and imaging at ER. CT ?duodenitis vs cholecystitis however no white count, fever. Will refer to GI.       Relevant Orders   Ambulatory referral to Gastroenterology   Thrombocytopenia ([place])    Other Visit Diagnoses    Non-intractable vomiting with nausea, unspecified vomiting  type       Relevant Orders   Ambulatory referral to Gastroenterology       Follow up plan: Return in about 2 months (around 08/26/2016) for annual exam, prior fasting for blood work.  Ria Bush, MD

## 2016-06-28 NOTE — Assessment & Plan Note (Signed)
Reviewed recent labs and imaging at ER. CT ?duodenitis vs cholecystitis however no white count, fever. Will refer to GI.

## 2016-06-28 NOTE — Assessment & Plan Note (Addendum)
Advised I would not prescribe #240 oxycodone 97m per month. Pt states Dr DSanjuan Damewill call me to discuss.  Update UDS today.

## 2016-06-28 NOTE — Patient Instructions (Addendum)
Update urine drug screen.  Let's start omeprazole 7m daily. We will refer you back to Dr MCollene Maresfor further evaluation.  Keep appointment with Dr BTrula Sladenext week.  Schedule physical in the next few months as you're due.

## 2016-06-28 NOTE — Progress Notes (Signed)
Pre visit review using our clinic review tool, if applicable. No additional management support is needed unless otherwise documented below in the visit note. 

## 2016-06-28 NOTE — Assessment & Plan Note (Signed)
S/p endovascular repair, followed by [facility], appt next month with CTA.

## 2016-06-28 NOTE — Assessment & Plan Note (Addendum)
Ongoing pain, unsteadiness.  Did not use ambulatory assistive device today

## 2016-06-28 NOTE — Assessment & Plan Note (Signed)
Update UDS.

## 2016-06-28 NOTE — Assessment & Plan Note (Signed)
Pt has self stopped metformin.  Will check A1c and fructosamine next labwork.

## 2016-06-30 ENCOUNTER — Encounter: Payer: Self-pay | Admitting: Family Medicine

## 2016-06-30 DIAGNOSIS — M25551 Pain in right hip: Secondary | ICD-10-CM | POA: Diagnosis not present

## 2016-06-30 DIAGNOSIS — M1611 Unilateral primary osteoarthritis, right hip: Secondary | ICD-10-CM | POA: Diagnosis not present

## 2016-07-03 ENCOUNTER — Encounter: Payer: Self-pay | Admitting: Family Medicine

## 2016-07-03 ENCOUNTER — Other Ambulatory Visit: Payer: Self-pay

## 2016-07-03 DIAGNOSIS — Z79891 Long term (current) use of opiate analgesic: Secondary | ICD-10-CM | POA: Diagnosis not present

## 2016-07-03 NOTE — Telephone Encounter (Signed)
[name] left v/m requesting rx Fentanyl patch. Call when ready for pick up. Last printed # 15 on 06/19/16; last seen 06/28/16.

## 2016-07-04 NOTE — Telephone Encounter (Signed)
Last printed #15 on 06/19/2016. This should last 30 days.  Early refill request.

## 2016-07-05 NOTE — Telephone Encounter (Signed)
[name] came by office to pick up fentanyl patch; [name] notified as instructed. Pt is changing patch every 2 days as instructed by Dr Sanjuan Dame at [facility] pain mgt. [name] request cb after reviewed by Dr Darnell Level.

## 2016-07-05 NOTE — Telephone Encounter (Signed)
Received #15 on 06/19/2016 - again, this should last 30 days. Early refill request.

## 2016-07-05 NOTE — Telephone Encounter (Signed)
Patient's wife notified. She said that patient had dropped a box under the bed and thought that the pharmacy had shorted him. She then reminded me that he was changing patches every other day not every third day. I advised her that we were aware of that and that 15 patches should last 30 days doing it that way.

## 2016-07-09 ENCOUNTER — Encounter: Payer: Self-pay | Admitting: Family Medicine

## 2016-07-09 ENCOUNTER — Telehealth: Payer: Self-pay | Admitting: Family Medicine

## 2016-07-09 DIAGNOSIS — R892 Abnormal level of other drugs, medicaments and biological substances in specimens from other organs, systems and tissues: Secondary | ICD-10-CM

## 2016-07-09 HISTORY — DX: Abnormal level of other drugs, medicaments and biological substances in specimens from other organs, systems and tissues: R89.2

## 2016-07-09 NOTE — Telephone Encounter (Signed)
plz notify drug screen returned positive for marijuana. Must not take recreational drugs if I am to continue prescribing controlled substances.  This means we will retest more frequently as it puts him at higher risk category.

## 2016-07-10 NOTE — Telephone Encounter (Signed)
Spoke with pt, pt state he understand the results. Pt denied using marijuana, he states we have he results mixed up with someone else result. Advised pt provider will test more frequently because is on a controlled substance and it put pt at a higher risk category. Pt states he understand and had no further questions at this time. LB

## 2016-07-11 ENCOUNTER — Encounter: Payer: Self-pay | Admitting: Surgery

## 2016-07-12 ENCOUNTER — Inpatient Hospital Stay: Admission: RE | Admit: 2016-07-12 | Payer: Medicare PPO | Source: Ambulatory Visit

## 2016-07-17 ENCOUNTER — Other Ambulatory Visit: Payer: Self-pay

## 2016-07-17 ENCOUNTER — Other Ambulatory Visit: Payer: Self-pay | Admitting: Family Medicine

## 2016-07-17 ENCOUNTER — Ambulatory Visit: Payer: Medicare PPO | Admitting: Surgery

## 2016-07-17 NOTE — Telephone Encounter (Signed)
[name] left v/m requesting rx fentanyl patch. Call when ready for pick up. Last printed # 15 on 06/19/16. Pt last seen 06/28/16.

## 2016-07-18 MED ORDER — FENTANYL 50 MCG/HR TD PT72: 50.0000 ug | MEDICATED_PATCH | TRANSDERMAL | 0 refills | Status: DC

## 2016-07-18 NOTE — Telephone Encounter (Signed)
Patient notified and Rx placed up front for pick up.

## 2016-07-18 NOTE — Telephone Encounter (Signed)
Printed and in [facility]' box.

## 2016-07-24 ENCOUNTER — Other Ambulatory Visit: Payer: Self-pay | Admitting: Family Medicine

## 2016-07-24 ENCOUNTER — Telehealth: Payer: Self-pay | Admitting: Family Medicine

## 2016-07-24 MED ORDER — OXYCODONE HCL 15 MG PO TABS: 15.0000 mg | ORAL_TABLET | ORAL | 0 refills | Status: DC | PRN

## 2016-07-24 NOTE — Telephone Encounter (Signed)
pts wife called to request a refill of pts oxycodeone to be called into [facility].  Please call him and let him know when the rx can be picked up.

## 2016-07-24 NOTE — Telephone Encounter (Signed)
Printed and in [facility]' box.

## 2016-07-24 NOTE — Telephone Encounter (Signed)
I called the pt and informed his wife the Rx was left at the front desk for pick up.

## 2016-08-14 ENCOUNTER — Other Ambulatory Visit: Payer: Self-pay

## 2016-08-14 MED ORDER — FENTANYL 50 MCG/HR TD PT72: 50.0000 ug | MEDICATED_PATCH | TRANSDERMAL | 0 refills | Status: DC

## 2016-08-14 NOTE — Telephone Encounter (Signed)
Mrs Vincelette left v/m requesting rx fentanyl. Call when ready for pick up. Last seen 06/28/16 and last printed # 15 on 07/18/16. Dr Darnell Level returns on 08/16/16.

## 2016-08-15 NOTE — Telephone Encounter (Signed)
Rx placed up front for pick up. Pt's wife has appt today and will pick up at that time.

## 2016-08-16 ENCOUNTER — Other Ambulatory Visit: Payer: Self-pay

## 2016-08-16 NOTE — Telephone Encounter (Signed)
Mrs Geathers left v/m requesting rx for oxycodone. Call when ready for pick up. Last printed # 180 on 07/24/16. Last seen 06/28/16.

## 2016-08-17 MED ORDER — OXYCODONE HCL 15 MG PO TABS: 15.0000 mg | ORAL_TABLET | ORAL | 0 refills | Status: DC | PRN

## 2016-08-17 NOTE — Telephone Encounter (Signed)
Message left advising patient and Rx placed up front for pick up.

## 2016-08-17 NOTE — Telephone Encounter (Signed)
Printed and in [facility] box.

## 2016-08-25 ENCOUNTER — Other Ambulatory Visit: Payer: Self-pay | Admitting: Family Medicine

## 2016-08-25 NOTE — Telephone Encounter (Signed)
Ok to refill? Last filled 07/26/16 #20 0RF

## 2016-08-27 ENCOUNTER — Emergency Department (HOSPITAL_COMMUNITY)
Admission: EM | Admit: 2016-08-27 | Discharge: 2016-08-28 | Disposition: A | Discharge status: Home / Self Care | Payer: Medicare PPO | Attending: Emergency Medicine | Admitting: Emergency Medicine

## 2016-08-27 ENCOUNTER — Encounter (HOSPITAL_COMMUNITY): Payer: Self-pay

## 2016-08-27 DIAGNOSIS — E119 Type 2 diabetes mellitus without complications: Secondary | ICD-10-CM | POA: Insufficient documentation

## 2016-08-27 DIAGNOSIS — Z955 Presence of coronary angioplasty implant and graft: Secondary | ICD-10-CM | POA: Diagnosis not present

## 2016-08-27 DIAGNOSIS — J449 Chronic obstructive pulmonary disease, unspecified: Secondary | ICD-10-CM | POA: Insufficient documentation

## 2016-08-27 DIAGNOSIS — I252 Old myocardial infarction: Secondary | ICD-10-CM | POA: Insufficient documentation

## 2016-08-27 DIAGNOSIS — I2510 Atherosclerotic heart disease of native coronary artery without angina pectoris: Secondary | ICD-10-CM | POA: Insufficient documentation

## 2016-08-27 DIAGNOSIS — I10 Essential (primary) hypertension: Secondary | ICD-10-CM | POA: Insufficient documentation

## 2016-08-27 DIAGNOSIS — N3000 Acute cystitis without hematuria: Secondary | ICD-10-CM | POA: Diagnosis not present

## 2016-08-27 DIAGNOSIS — R1084 Generalized abdominal pain: Secondary | ICD-10-CM | POA: Diagnosis not present

## 2016-08-27 DIAGNOSIS — Z7982 Long term (current) use of aspirin: Secondary | ICD-10-CM | POA: Insufficient documentation

## 2016-08-27 LAB — COMPREHENSIVE METABOLIC PANEL
ALT: 13 U/L — AB (ref 17–63)
AST: 27 U/L (ref 15–41)
Albumin: 2.8 g/dL — ABNORMAL LOW (ref 3.5–5.0)
Alkaline Phosphatase: 106 U/L (ref 38–126)
Anion gap: 10 (ref 5–15)
BUN: 14 mg/dL (ref 6–20)
CHLORIDE: 98 mmol/L — AB (ref 101–111)
CO2: 26 mmol/L (ref 22–32)
Calcium: 8.8 mg/dL — ABNORMAL LOW (ref 8.9–10.3)
Creatinine, Ser: 1.16 mg/dL (ref 0.61–1.24)
Glucose, Bld: 153 mg/dL — ABNORMAL HIGH (ref 65–99)
POTASSIUM: 3.7 mmol/L (ref 3.5–5.1)
Sodium: 134 mmol/L — ABNORMAL LOW (ref 135–145)
Total Bilirubin: 1.5 mg/dL — ABNORMAL HIGH (ref 0.3–1.2)
Total Protein: 7.8 g/dL (ref 6.5–8.1)

## 2016-08-27 LAB — CBC
HEMATOCRIT: 39.1 % (ref 39.0–52.0)
HEMOGLOBIN: 12.6 g/dL — AB (ref 13.0–17.0)
MCH: 30.4 pg (ref 26.0–34.0)
MCHC: 32.2 g/dL (ref 30.0–36.0)
MCV: 94.4 fL (ref 78.0–100.0)
Platelets: 123 10*3/uL — ABNORMAL LOW (ref 150–400)
RBC: 4.14 MIL/uL — AB (ref 4.22–5.81)
RDW: 16.1 % — ABNORMAL HIGH (ref 11.5–15.5)
WBC: 12.5 10*3/uL — AB (ref 4.0–10.5)

## 2016-08-27 LAB — LIPASE, BLOOD: LIPASE: 15 U/L (ref 11–51)

## 2016-08-27 NOTE — ED Triage Notes (Signed)
Pt complaining of RLQ abdominal pain x 1 month. Pt states N/V. Pt denies any episodes of emesis today. Pt denies any diarrhea. Pt complaining of fevers/chills. Pt states hx AAA.

## 2016-08-28 LAB — URINALYSIS, ROUTINE W REFLEX MICROSCOPIC
BILIRUBIN URINE: NEGATIVE
Glucose, UA: NEGATIVE mg/dL
Ketones, ur: NEGATIVE mg/dL
Nitrite: NEGATIVE
PROTEIN: 30 mg/dL — AB
Specific Gravity, Urine: 1.016 (ref 1.005–1.030)
pH: 5 (ref 5.0–8.0)

## 2016-08-28 MED ORDER — CEPHALEXIN 250 MG PO CAPS
500.0000 mg | ORAL_CAPSULE | Freq: Once | ORAL | Status: AC
  Administered 2016-08-28: 500 mg via ORAL
  Filled 2016-08-28: qty 2

## 2016-08-28 MED ORDER — OXYCODONE-ACETAMINOPHEN 5-325 MG PO TABS
2.0000 | ORAL_TABLET | Freq: Once | ORAL | Status: AC
  Administered 2016-08-28: 2 via ORAL
  Filled 2016-08-28: qty 2

## 2016-08-28 MED ORDER — CEPHALEXIN 500 MG PO CAPS: 500.0000 mg | ORAL_CAPSULE | Freq: Three times a day (TID) | ORAL | 0 refills | Status: DC

## 2016-08-28 MED ORDER — ONDANSETRON 4 MG PO TBDP
8.0000 mg | ORAL_TABLET | Freq: Once | ORAL | Status: AC
  Administered 2016-08-28: 8 mg via ORAL
  Filled 2016-08-28: qty 2

## 2016-08-28 NOTE — ED Provider Notes (Signed)
[facility] DEPT Provider Note   CSN: [id number] Arrival date & time: 08/27/16  2233  By signing my name below, I, [name], attest that this documentation has been prepared under the direction and in the presence of Miguel Fraise, MD. Electronically Signed: Oleh Ware, Scribe. 08/28/16. 12:24 AM.   History   Chief Complaint Chief Complaint  Patient presents with  . Abdominal Pain    HPI [name] is a 61 y.o. male with history of ischemic cardiomyopathy, diabetes, HTN, HLD, NASH cirrhosis, and AAA repair  who presents to the ED for evaluation of abdominal pain. This patient states that for the last 2 months he has experienced constant, everyday abdominal pain without any fever, diarrhea, or rectal bleeding. He did have a bout of vomiting on 1/8 where he presented to the ED and received a negative CT abdomen. He is also reporting intermittent dysuria over the last 4 weeks as well as chronic back pain. He denies any significant changes in his symptoms within the last 2 days. He denies any focal weaknesses or loss of sensation distally.   The history is provided by the patient. No language interpreter was used.  Abdominal Pain   This is a chronic problem. Episode onset: "around 2 months" The problem occurs constantly. The problem has not changed since onset.The pain is associated with an unknown factor. The pain is located in the generalized abdominal region. Associated symptoms include vomiting (distant) and dysuria. Pertinent negatives include fever, diarrhea and nausea. The symptoms are aggravated by palpation. Relieved by: rest.    Past Medical History:  Diagnosis Date  . AAA (abdominal aortic aneurysm) ([place]) 09/2012--  monitored by [name]   stable 5.6cm CTA abdomen 2016  . Abnormal drug screen 07/09/2016   1/2/018 - positive oxycodone, fentanyl, inapprop positive MJ - mod risk  . Allergic rhinitis   . B12 deficiency   . CAD (coronary artery disease)  cardiologist-  [name]   x3 with stents last 2005, EF 40%, predominantly RCA by CT 2016  . Cataracts, bilateral   . Cervical spondylosis 05/2010   s/p surgery  . Charcot Marie Tooth muscular atrophy dx  [phone]   neurologist--  Miguel love--  type 2 per pt  . Chronic pain syndrome    [facility] with [facility] --> disagreement and transfered care to Miguel Sanjuan Dame at [facility], requests PCP write Rx but f/u with pain clinic Q6-12 months  . COPD (chronic obstructive pulmonary disease) ([place]) 10/2011   minimal by PFTs  . DDD (degenerative disc disease)   . Disturbances of sensation of smell and taste    improving  . Dyspnea on exertion   . GERD (gastroesophageal reflux disease)   . Gout   . Headache   . Hepatitis    hepatitis B  . Hidradenitis    right groin  . Hidradenitis suppurativa dx 2011   goin and leg crease   followd by [name] - daily bactrim, s/p intralesional steroid injection 10/2010  . Hip osteoarthritis    s/p intraarticular steroid shot (12/2012) ([place])  . History of hepatitis B 1983  . History of MI (myocardial infarction)    2000  &  2005  . History of pneumonia   . History of viral meningitis 2000  . HLD (hyperlipidemia)   . HTN (hypertension)   . Ischemic cardiomyopathy    s/p inferior MI  --  current ef per myoview 39%  . Liver cirrhosis secondary  to NASH [facility]) 01/2014   by CT scan, rec virtual colonoscopy by [name] 06/2014  . Lumbar herniated disc   . Myocardial infarction    x2  . Nocturia more than twice per night   . Obesity   . Spinal stenosis    released from [facility].  established with [facility] (07/2013)  . T2DM (type 2 diabetes mellitus) ([place])    ABIs WNL 2016  . Vitamin D deficiency     Patient Active Problem List   Diagnosis Date Noted  . Abnormal drug screen 07/09/2016  . Thrombocytopenia ([place]) 04/29/2016  . Anemia 04/29/2016  . Protein-calorie malnutrition ([place]) 04/29/2016  . Right sided  abdominal pain 04/26/2016  . Encounter for chronic pain management 01/04/2016  . Preop cardiovascular exam 09/01/2014  . Chronic fatigue 07/22/2014  . Esophageal dysphagia 07/22/2014  . Hematuria 05/19/2014  . Chest pain 05/19/2014  . Health maintenance examination 04/21/2014  . Advanced care planning/counseling discussion 04/21/2014  . Orthostatic hypotension 03/13/2014  . Liver cirrhosis secondary to NASH ([place]) 01/03/2014  . Osteoarthritis of right hip 08/16/2012  . Medicare annual wellness visit, subsequent 06/07/2012  . AAA (abdominal aortic aneurysm) ([place])   . DOE (dyspnea on exertion) 10/06/2011  . DDD (degenerative disc disease), cervical   . Vitamin D deficiency 03/14/2011  . Skin rash 03/14/2011  . History of non anemic vitamin B12 deficiency 08/05/2010  . Disturbances of sensation of smell and taste 08/02/2010  . Diabetes type 2, controlled ([place]) 06/27/2010  . OSA (obstructive sleep apnea) 05/16/2010  . Cervical spondylosis 05/05/2010  . Obesity, Class III, BMI 40-49.9 (morbid obesity) ([place]) 05/03/2010  . Hidradenitis 05/03/2010  . Ex-smoker 10/21/2009  . HLD (hyperlipidemia) 09/07/2009  . Gout 09/07/2009  . [place] DISEASE 09/07/2009  . Essential hypertension 09/07/2009  . Coronary atherosclerosis 09/07/2009  . CARDIOMYOPATHY 09/07/2009  . COPD, minimal 09/07/2009  . Chronic pain syndrome 09/07/2009    Past Surgical History:  Procedure Laterality Date  . ABDOMINAL AORTIC ENDOVASCULAR FENESTRATED STENT GRAFT N/A 11/30/2015   Procedure: ABDOMINAL AORTIC ENDOVASCULAR FENESTRATED STENT GRAFT;  Surgeon: Miguel Mitchell, MD;  Location: [facility];  Service: Vascular;  Laterality: N/A;  . ANTERIOR CERVICAL DECOMP/DISCECTOMY FUSION  01-07-2010    C4 -- C7  . CARDIAC CATHETERIZATION  03-30-2005  [name]   ef 40% w/ inferior akinesis/  LM and CFX angiographically normal/  pLAD 30%/   Widely patent stents in RCA and PDA widely patent  . CARDIOVASCULAR STRESS TEST   10-23-2012  [name]   No ischemia/  Moderate scar in the inferior wall, otherwise normal perfusion/  LV ef 39%,  LV wall motion: inferior/ inferolateral hypokinesis  . COLONOSCOPY  05/06/2007   normal, small int hemorrhoids rpt 5 yrs due to fmhx - rec against rpt colonoscopy by [name]  . CORONARY ANGIOPLASTY  2000  [name]   PCI to RCA and PDA  . CORONARY ANGIOPLASTY WITH STENT PLACEMENT  03-19-2005  Miguel Gwyndolyn Saxon downey   inferior STEMI--- DES x4 to RCA w/ balloon angioplasty and balloon angioplasty to jailed PDA ostium/  severe hypokinesis of midinferor wall, ef 50%/  dLM 20%,  mLAD 20%,  dCFX 60%  . HYDRADENITIS EXCISION Right 12/31/2014   Procedure: WIDE EXCISION HIDRADENITIS GROIN; Coralie Keens, MD  . LUMBAR DISC SURGERY     L5-S1  . LUMBAR LAMINECTOMY  05-18-2010   L2--5   laminectomy/foraminotomy for stenosis Joya Salm)  . MYELOGRAM     L5-S1 and L1-2 spondylosis  .  SACROILIAC JOINT INJECTION Bilateral 10/2013   [place]  . TONSILLECTOMY AND ADENOIDECTOMY  1972       Home Medications    Prior to Admission medications   Medication Sig Start Date End Date Taking? Authorizing Provider  albuterol (PROVENTIL HFA;VENTOLIN HFA) 108 (90 Base) MCG/ACT inhaler Inhale 2 puffs into the lungs every 6 (six) hours as needed for wheezing or shortness of breath. 08/17/15   Jearld Fenton, NP  albuterol (PROVENTIL) (2.5 MG/3ML) 0.083% nebulizer solution USE 1 VIAL PER NEBULIZER EVERY 6 HRS AS NEEDED FOR WHEEZING 07/19/15   Ria Bush, MD  aspirin 325 MG tablet Take 1 tablet (325 mg total) by mouth daily. 07/19/15   Ria Bush, MD  cholecalciferol 2000 units TABS Take 2,000 Units by mouth daily. 04/29/16   Ria Bush, MD  dicyclomine (BENTYL) 20 MG tablet Take 1 tablet (20 mg total) by mouth 3 (three) times daily before meals. As needed for abdominal pain 06/12/16   Delice Bison Ward, DO  DULoxetine (CYMBALTA) 60 MG capsule TAKE ONE CAPSULE BY MOUTH DAILY 07/17/16   Ria Bush,  MD  fentaNYL (DURAGESIC - DOSED MCG/HR) 50 MCG/HR Place 1 patch (50 mcg total) onto the skin every other day. 08/14/16   Owens Loffler, MD  fluocinonide cream (LIDEX) 5.63 % Apply 1 application topically 2 (two) times daily.    Historical Provider, MD  fluticasone (FLONASE) 50 MCG/ACT nasal spray Place 2 sprays into both nostrils daily as needed for allergies.  02/09/12   Ria Bush, MD  folic acid (FOLVITE) 1 MG tablet TAKE 1 TABLET BY MOUTH DAILY 07/17/16   Ria Bush, MD  glucose blood test strip 1 each by Other route as needed for other. Use as instructed to check sugar once daily and as needed. **ONE TOUCH ULTRA** Dx: E11.9 04/05/15   Ria Bush, MD  irbesartan (AVAPRO) 75 MG tablet Take 0.5 tablets (37.5 mg total) by mouth daily. 04/09/15   Ria Bush, MD  loperamide (IMODIUM) 2 MG capsule Take 1 capsule (2 mg total) by mouth 4 (four) times daily as needed for diarrhea or loose stools. 06/12/16   Kristen N Ward, DO  metoprolol succinate (TOPROL-XL) 25 MG 24 hr tablet TAKE 1/2 TABLET BY MOUTH ONCE DAILY 07/17/16   Ria Bush, MD  Misc Natural Products [facility] ADVANCED) CAPS Take 1 capsule by mouth 2 (two) times daily.    Historical Provider, MD  nitroGLYCERIN (NITROSTAT) 0.4 MG SL tablet Place 1 tablet (0.4 mg total) under the tongue every 5 (five) minutes as needed for chest pain. 10/10/12   Lelon Perla, MD  nystatin (MYCOSTATIN/NYSTOP) 100000 UNIT/GM POWD Apply 20 g topically 2 (two) times daily.    Historical Provider, MD  omeprazole (PRILOSEC) 40 MG capsule Take 1 capsule (40 mg total) by mouth daily. 06/28/16   Ria Bush, MD  ondansetron (ZOFRAN-ODT) 4 MG disintegrating tablet DISSOLVE ONE OR TWO TABLETS IN THE MOUTHEVERY 8 HOURS AS NEEDED FOR NAUSEA OR VOMITING. 08/25/16   Ria Bush, MD  oxyCODONE (ROXICODONE) 15 MG immediate release tablet Take 1 tablet (15 mg total) by mouth every 4 (four) hours as needed. 08/17/16   Ria Bush, MD    polyethylene glycol powder (GLYCOLAX/MIRALAX) powder Take 17 g by mouth daily as needed for moderate constipation. 01/15/14   Ria Bush, MD  vitamin B-12 (CYANOCOBALAMIN) 500 MCG tablet Take 500 mcg by mouth daily.    Historical Provider, MD    Family History Family History  Problem Relation Age of  Onset  . Cancer Mother     colon  . Diabetes Mother   . Kidney disease Mother   . Aneurysm Mother     AAA  . Rheum arthritis Mother   . Charcot-Marie-Tooth disease Mother   . Heart disease Mother     before age 28  . Cancer Father     skin  . Heart attack Father   . Heart disease Father     before age 31  . Cancer Brother     skin  . Coronary artery disease Brother   . Cancer Brother     small cell lung cancer  . Aneurysm Brother     AAA  . Rheum arthritis Sister   . Rheum arthritis Brother     Social History Social History  Substance Use Topics  . Smoking status: Current Every Day Smoker    Packs/day: 1.00    Years: 57.00    Types: Cigarettes    Start date: 06/06/1967  . Smokeless tobacco: Never Used     Comment: stopped smoking a pipe in 2015  DOES SMOKE E CIG  . Alcohol use No     Allergies   Statins; Losartan; Allopurinol; Baclofen; and Gabapentin   Review of Systems Review of Systems  Constitutional: Negative for fever.  Gastrointestinal: Positive for abdominal pain and vomiting (distant). Negative for blood in stool, diarrhea and nausea.  Genitourinary: Positive for dysuria.  Musculoskeletal: Positive for back pain (chronic, unchanged).  Neurological: Negative for weakness and numbness.  All other systems reviewed and are negative.    Physical Exam Updated Vital Signs BP 104/78 (BP Location: Left Arm)   Pulse (!) 111   Temp 98.3 F (36.8 C) (Oral)   Resp 15   SpO2 97%   Physical Exam CONSTITUTIONAL: chronically ill appearing, obese HEAD: Normocephalic/atraumatic EYES: EOMI/PERRL ENMT: Mucous membranes moist NECK: supple no meningeal  signs SPINE/BACK:entire spine nontender CV: S1/S2 noted, no murmurs/rubs/gallops noted LUNGS: Lungs are clear to auscultation bilaterally, no apparent distress ABDOMEN: soft, mild diffuse tenderness, no pulsatile mass, no rebound or guarding, bowel sounds noted throughout abdomen, patient is obese GU:no cva tenderness, abrasion under the scrotum with a small amount of bleeding that is chronic per patient, no signs of cellulitis or abscess, nurse chaperone present NEURO: Pt is awake/alert/appropriate, moves all extremitiesx4.  No facial droop.   EXTREMITIES: pulses normal/equal, full ROM SKIN: warm, color normal PSYCH: no abnormalities of mood noted, alert and oriented to situation  ED Treatments / Results  Labs (all labs ordered are listed, but only abnormal results are displayed) Labs Reviewed  COMPREHENSIVE METABOLIC PANEL - Abnormal; Notable for the following:       Result Value   Sodium 134 (*)    Chloride 98 (*)    Glucose, Bld 153 (*)    Calcium 8.8 (*)    Albumin 2.8 (*)    ALT 13 (*)    Total Bilirubin 1.5 (*)    All other components within normal limits  CBC - Abnormal; Notable for the following:    WBC 12.5 (*)    RBC 4.14 (*)    Hemoglobin 12.6 (*)    RDW 16.1 (*)    Platelets 123 (*)    All other components within normal limits  URINALYSIS, ROUTINE W REFLEX MICROSCOPIC - Abnormal; Notable for the following:    Color, Urine AMBER (*)    APPearance HAZY (*)    Hgb urine dipstick MODERATE (*)    Protein, ur  30 (*)    Leukocytes, UA LARGE (*)    Bacteria, UA MANY (*)    Squamous Epithelial / LPF 0-5 (*)    All other components within normal limits  URINE CULTURE  LIPASE, BLOOD    EKG  EKG Interpretation None       Radiology No results found.  Procedures Procedures (including critical care time)  Medications Ordered in ED Medications  oxyCODONE-acetaminophen (PERCOCET/ROXICET) 5-325 MG per tablet 2 tablet (2 tablets Oral Given 08/28/16 0040)    ondansetron (ZOFRAN-ODT) disintegrating tablet 8 mg (8 mg Oral Given 08/28/16 0040)  cephALEXin (KEFLEX) capsule 500 mg (500 mg Oral Given 08/28/16 0311)     Initial Impression / Assessment and Plan / ED Course  I have reviewed the triage vital signs and the nursing notes.  Pertinent labs   results that were available during my care of the patient were reviewed by me and considered in my medical decision making (see chart for details).     BP 116/74   Pulse 100   Temp 98.3 F (36.8 C) (Oral)   Resp 20   SpO2 95%  Pt stable in the ED Pt with abdominal pain for at least 2 months - this has been a daily issue There was no acute worsening tonight Records reveal previous ED visits as well PCP visits for this pain I doubt acute complication from AAA or other abdominal catastrophe or surgical emergency He does have evidence of UTI Will start keflex Referred to GI as well as his PCP  Final Clinical Impressions(s) / ED Diagnoses   Final diagnoses:  Generalized abdominal pain  Acute cystitis without hematuria    New Prescriptions New Prescriptions   CEPHALEXIN (KEFLEX) 500 MG CAPSULE    Take 1 capsule (500 mg total) by mouth 3 (three) times daily.  I personally performed the services described in this documentation, which was scribed in my presence. The recorded information has been reviewed and is accurate.      Miguel Fraise, MD 08/28/16 [phone]

## 2016-08-28 NOTE — ED Notes (Signed)
Patient denies pain and is resting comfortably.  

## 2016-08-29 ENCOUNTER — Other Ambulatory Visit: Payer: Self-pay | Admitting: Family Medicine

## 2016-08-29 DIAGNOSIS — E785 Hyperlipidemia, unspecified: Secondary | ICD-10-CM

## 2016-08-29 DIAGNOSIS — Z125 Encounter for screening for malignant neoplasm of prostate: Secondary | ICD-10-CM

## 2016-08-29 DIAGNOSIS — E440 Moderate protein-calorie malnutrition: Secondary | ICD-10-CM

## 2016-08-29 DIAGNOSIS — E118 Type 2 diabetes mellitus with unspecified complications: Secondary | ICD-10-CM

## 2016-08-29 DIAGNOSIS — E559 Vitamin D deficiency, unspecified: Secondary | ICD-10-CM

## 2016-08-31 ENCOUNTER — Ambulatory Visit (INDEPENDENT_AMBULATORY_CARE_PROVIDER_SITE_OTHER): Payer: Medicare PPO

## 2016-08-31 ENCOUNTER — Telehealth: Payer: Self-pay | Admitting: *Deleted

## 2016-08-31 VITALS — BP 124/78 | HR 89 | Temp 99.4°F | Ht 67.0 in | Wt 304.5 lb

## 2016-08-31 DIAGNOSIS — E46 Unspecified protein-calorie malnutrition: Secondary | ICD-10-CM | POA: Diagnosis not present

## 2016-08-31 DIAGNOSIS — E440 Moderate protein-calorie malnutrition: Secondary | ICD-10-CM

## 2016-08-31 DIAGNOSIS — E785 Hyperlipidemia, unspecified: Secondary | ICD-10-CM

## 2016-08-31 DIAGNOSIS — Z0000 Encounter for general adult medical examination without abnormal findings: Secondary | ICD-10-CM

## 2016-08-31 DIAGNOSIS — Z114 Encounter for screening for human immunodeficiency virus [HIV]: Secondary | ICD-10-CM

## 2016-08-31 DIAGNOSIS — K7581 Nonalcoholic steatohepatitis (NASH): Secondary | ICD-10-CM | POA: Diagnosis not present

## 2016-08-31 DIAGNOSIS — E559 Vitamin D deficiency, unspecified: Secondary | ICD-10-CM | POA: Diagnosis not present

## 2016-08-31 DIAGNOSIS — Z125 Encounter for screening for malignant neoplasm of prostate: Secondary | ICD-10-CM | POA: Diagnosis not present

## 2016-08-31 DIAGNOSIS — E118 Type 2 diabetes mellitus with unspecified complications: Secondary | ICD-10-CM | POA: Diagnosis not present

## 2016-08-31 LAB — LIPID PANEL
CHOL/HDL RATIO: 5
Cholesterol: 136 mg/dL (ref 0–200)
HDL: 26.9 mg/dL — AB (ref >39.00)
LDL Cholesterol: 88 mg/dL (ref 0–99)
NONHDL: 108.7
Triglycerides: 106 mg/dL (ref 0.0–149.0)
VLDL: 21.2 mg/dL (ref 0.0–40.0)

## 2016-08-31 LAB — VITAMIN D 25 HYDROXY (VIT D DEFICIENCY, FRACTURES): VITD: 24.14 ng/mL — AB (ref 30.00–100.00)

## 2016-08-31 LAB — URINE CULTURE

## 2016-08-31 LAB — PSA: PSA: 0.09 ng/mL — AB (ref 0.10–4.00)

## 2016-08-31 LAB — HEMOGLOBIN A1C: HEMOGLOBIN A1C: 5.7 % (ref 4.6–6.5)

## 2016-08-31 NOTE — Progress Notes (Signed)
Pre visit review using our clinic review tool, if applicable. No additional management support is needed unless otherwise documented below in the visit note. 

## 2016-08-31 NOTE — Telephone Encounter (Signed)
Post ED Visit - Positive Culture Follow-up  Culture report reviewed by antimicrobial stewardship pharmacist:  []  Elenor Quinones, Pharm.D. []  Heide Guile, Pharm.D., BCPS AQ-ID []  Parks Neptune, Pharm.D., BCPS [x]  Alycia Rossetti, Pharm.D., BCPS []  [place], Pharm.D., BCPS, AAHIVP []  Legrand Como, Pharm.D., BCPS, AAHIVP []  Salome Arnt, PharmD, BCPS []  Dimitri Ped, PharmD, BCPS []  Vincenza Hews, PharmD, BCPS  Positive urine culture Treated with Cephalexin, organism sensitive to the same and no further patient follow-up is required at this time.  Harlon Flor [facility] 08/31/2016, 12:54 PM

## 2016-08-31 NOTE — Patient Instructions (Signed)
Mr. Pattison , Thank you for taking time to come for your Medicare Wellness Visit. I appreciate your ongoing commitment to your health goals. Please review the following plan we discussed and let me know if I can assist you in the future.   These are the goals we discussed: Goals    . safety          Starting 08/31/16, I will continue to use cane or walker to reduce of falls.        This is a list of the screening recommended for you and due dates:  Health Maintenance  Topic Date Due  . Complete foot exam   09/08/2016*  . Eye exam for diabetics  08/31/2017*  . Pneumococcal vaccine (2) 08/30/2021*  . DTaP/Tdap/Td vaccine (1 - Tdap) 06/04/2025*  . Tetanus Vaccine  06/04/2025*  . Hemoglobin A1C  10/24/2016  . Colon Cancer Screening  08/01/2019  . Flu Shot  Completed  .  Hepatitis C: One time screening is recommended by Center for Disease Control  (CDC) for  adults born from [birth year] through 1966.   Completed  . HIV Screening  Completed  *Topic was postponed. The date shown is not the original due date.   Preventive Care for Adults  A healthy lifestyle and preventive care can promote health and wellness. Preventive health guidelines for adults include the following key practices.  . A routine yearly physical is a good way to check with your health care provider about your health and preventive screening. It is a chance to share any concerns and updates on your health and to receive a thorough exam.  . Visit your dentist for a routine exam and preventive care every 6 months. Brush your teeth twice a day and floss once a day. Good oral hygiene prevents tooth decay and gum disease.  . The frequency of eye exams is based on your age, health, family medical history, use  of contact lenses, and other factors. Follow your health care provider's ecommendations for frequency of eye exams.  . Eat a healthy diet. Foods like vegetables, fruits, whole grains, low-fat dairy products, and lean  protein foods contain the nutrients you need without too many calories. Decrease your intake of foods high in solid fats, added sugars, and salt. Eat the right amount of calories for you. Get information about a proper diet from your health care provider, if necessary.  . Regular physical exercise is one of the most important things you can do for your health. Most adults should get at least 150 minutes of moderate-intensity exercise (any activity that increases your heart rate and causes you to sweat) each week. In addition, most adults need muscle-strengthening exercises on 2 or more days a week.  Silver Sneakers may be a benefit available to you. To determine eligibility, you may visit the website: www.silversneakers.com or contact program at (787)177-0423 Mon-Fri between 8AM-8PM.   . Maintain a healthy weight. The body mass index (BMI) is a screening tool to identify possible weight problems. It provides an estimate of body fat based on height and weight. Your health care provider can find your BMI and can help you achieve or maintain a healthy weight.   For adults 20 years and older: ? A BMI below 18.5 is considered underweight. ? A BMI of 18.5 to 24.9 is normal. ? A BMI of 25 to 29.9 is considered overweight. ? A BMI of 30 and above is considered obese.   . Maintain normal blood lipids and  cholesterol levels by exercising and minimizing your intake of saturated fat. Eat a balanced diet with plenty of fruit and vegetables. Blood tests for lipids and cholesterol should begin at age 98 and be repeated every 5 years. If your lipid or cholesterol levels are high, you are over 50, or you are at high risk for heart disease, you may need your cholesterol levels checked more frequently. Ongoing high lipid and cholesterol levels should be treated with medicines if diet and exercise are not working.  . If you smoke, find out from your health care provider how to quit. If you do not use tobacco, please  do not start.  . If you choose to drink alcohol, please do not consume more than 2 drinks per day. One drink is considered to be 12 ounces (355 mL) of beer, 5 ounces (148 mL) of wine, or 1.5 ounces (44 mL) of liquor.  . If you are 64-94 years old, ask your health care provider if you should take aspirin to prevent strokes.  . Use sunscreen. Apply sunscreen liberally and repeatedly throughout the day. You should seek shade when your shadow is shorter than you. Protect yourself by wearing long sleeves, pants, a wide-brimmed hat, and sunglasses year round, whenever you are outdoors.  . Once a month, do a whole body skin exam, using a mirror to look at the skin on your back. Tell your health care provider of new moles, moles that have irregular borders, moles that are larger than a pencil eraser, or moles that have changed in shape or color.

## 2016-08-31 NOTE — Progress Notes (Signed)
PCP notes:   Health maintenance:  Foot exam - PCP will complete at next appt  Eye exam - pt plans to schedule future appt  Tetanus - pt will contact insurance about coverage  HIV screening   Abnormal screenings:   Hearing - failed Fall risk - hx of multiple falls with injury Depression score: 19  Patient concerns:   Patient verbalized several physical health, emotional,  and familial concerns.   Nurse concerns:  None  Next PCP appt:   09/08/16 @ 1030

## 2016-08-31 NOTE — Progress Notes (Signed)
Subjective:   [name] is a 61 y.o. male who presents for Medicare Annual/Subsequent preventive examination.  Review of Systems:  N/A Cardiac Risk Factors include: advanced age (>85mn, >>15women);male gender;obesity (BMI >30kg/m2);diabetes mellitus;dyslipidemia;hypertension;smoking/ tobacco exposure     Objective:    Vitals: BP 124/78 (BP Location: Right Wrist, Patient Position: Sitting, Cuff Size: Normal)   Pulse 89   Temp 99.4 F (37.4 C) (Oral)   Ht 5' 7"  (1.702 m)   Wt (!) 304 lb 8 oz (138.1 kg)   SpO2 94%   BMI 47.69 kg/m   Body mass index is 47.69 kg/m.  Tobacco History  Smoking Status  . Current Every Day Smoker  . Packs/day: 1.00  . Years: 57.00  . Types: Cigarettes  . Start date: 06/06/1967  Smokeless Tobacco  . Never Used    Comment: stopped smoking a pipe in 2015  DOES SMOKE E CIG     Ready to quit: No Counseling given: No   Past Medical History:  Diagnosis Date  . AAA (abdominal aortic aneurysm) ([place] 09/2012--  monitored by dr bTrula Slade  stable 5.6cm CTA abdomen 2016  . Abnormal drug screen 07/09/2016   1/2/018 - positive oxycodone, fentanyl, inapprop positive MJ - mod risk  . Allergic rhinitis   . B12 deficiency   . CAD (coronary artery disease) cardiologist-  dr cStanford Breed  x3 with stents last 2005, EF 40%, predominantly RCA by CT 2016  . Cataracts, bilateral   . Cervical spondylosis 05/2010   s/p surgery  . Charcot Marie Tooth muscular atrophy dx  [phone]  neurologist--  dr love--  type 2 per pt  . Chronic pain syndrome    established with [facility] ([place]) --> disagreement and [facility] write Rx but f/u with pain clinic Q6-12 months  . COPD (chronic obstructive pulmonary disease) ([place] 10/2011   minimal by PFTs  . DDD (degenerative disc disease)   . Disturbances of sensation of smell and taste    improving  . Dyspnea on exertion   . GERD (gastroesophageal  reflux disease)   . Gout   . Headache   . Hepatitis    hepatitis B  . Hidradenitis    right groin  . Hidradenitis suppurativa dx 2011   goin and leg crease   followd by LLyndle Herrlich- daily bactrim, s/p intralesional steroid injection 10/2010  . Hip osteoarthritis    s/p intraarticular steroid shot (12/2012) ([place])  . History of hepatitis B 1983  . History of MI (myocardial infarction)    2000  &  2005  . History of pneumonia   . History of viral meningitis 2000  . HLD (hyperlipidemia)   . HTN (hypertension)   . Ischemic cardiomyopathy    s/p inferior MI  --  current ef per myoview 39%  . Liver cirrhosis secondary to NASH ([place] 01/2014   by CT scan, rec virtual colonoscopy by Dr MCollene Mares1/2016  . Lumbar herniated disc   . Myocardial infarction    x2  . Nocturia more than twice per night   . Obesity   . Spinal stenosis    [place] [facility]  established with [facility] (07/2013)  . T2DM (type 2 diabetes mellitus) ([place]    ABIs WNL 2016  . Vitamin D deficiency    Past Surgical History:  Procedure Laterality Date  . ABDOMINAL AORTIC ENDOVASCULAR FENESTRATED STENT GRAFT N/A 11/30/2015  Procedure: ABDOMINAL AORTIC ENDOVASCULAR FENESTRATED STENT GRAFT;  Surgeon: Serafina Mitchell, MD;  Location: [facility];  Service: Vascular;  Laterality: N/A;  . ANTERIOR CERVICAL DECOMP/DISCECTOMY FUSION  01-07-2010    C4 -- C7  . CARDIAC CATHETERIZATION  03-30-2005  dr Albertine Patricia   ef 40% w/ inferior akinesis/  LM and CFX angiographically normal/  pLAD 30%/   Widely patent stents in RCA and PDA widely patent  . CARDIOVASCULAR STRESS TEST  10-23-2012  dr Stanford Breed   No ischemia/  Moderate scar in the inferior wall, otherwise normal perfusion/  LV ef 39%,  LV wall motion: inferior/ inferolateral hypokinesis  . COLONOSCOPY  05/06/2007   normal, small int hemorrhoids rpt 5 yrs due to fmhx - rec against rpt colonoscopy by Dr Collene Mares  . CORONARY ANGIOPLASTY  2000  dr Stanford Breed   PCI to RCA and PDA  .  CORONARY ANGIOPLASTY WITH STENT PLACEMENT  03-19-2005  dr Gwyndolyn Saxon downey   inferior STEMI--- DES x4 to RCA w/ balloon angioplasty and balloon angioplasty to jailed PDA ostium/  severe hypokinesis of midinferor wall, ef 50%/  dLM 20%,  mLAD 20%,  dCFX 60%  . HYDRADENITIS EXCISION Right 12/31/2014   Procedure: WIDE EXCISION HIDRADENITIS GROIN; Coralie Keens, MD  . LUMBAR DISC SURGERY     L5-S1  . LUMBAR LAMINECTOMY  05-18-2010   L2--5   laminectomy/foraminotomy for stenosis Joya Salm)  . MYELOGRAM     L5-S1 and L1-2 spondylosis  . SACROILIAC JOINT INJECTION Bilateral 10/2013   [place]  . TONSILLECTOMY AND ADENOIDECTOMY  1972   Family History  Problem Relation Age of Onset  . Cancer Mother     colon  . Diabetes Mother   . Kidney disease Mother   . Aneurysm Mother     AAA  . Rheum arthritis Mother   . Charcot-Marie-Tooth disease Mother   . Heart disease Mother     before age 67  . Cancer Father     skin  . Heart attack Father   . Heart disease Father     before age 23  . Cancer Brother     skin  . Coronary artery disease Brother   . Cancer Brother     small cell lung cancer  . Aneurysm Brother     AAA  . Rheum arthritis Sister   . Rheum arthritis Brother    History  Sexual Activity  . Sexual activity: Not on file    Outpatient Encounter Prescriptions as of 08/31/2016  Medication Sig  . albuterol (PROVENTIL HFA;VENTOLIN HFA) 108 (90 Base) MCG/ACT inhaler Inhale 2 puffs into the lungs every 6 (six) hours as needed for wheezing or shortness of breath.  [name] albuterol (PROVENTIL) (2.5 MG/3ML) 0.083% nebulizer solution USE 1 VIAL PER NEBULIZER EVERY 6 HRS AS NEEDED FOR WHEEZING  . aspirin 325 MG tablet Take 1 tablet (325 mg total) by mouth daily.  . cephALEXin (KEFLEX) 500 MG capsule Take 1 capsule (500 mg total) by mouth 3 (three) times daily.  . cholecalciferol 2000 units TABS Take 2,000 Units by mouth daily.  [name] dicyclomine (BENTYL) 20 MG tablet Take 1 tablet (20 mg total) by  mouth 3 (three) times daily before meals. As needed for abdominal pain  . DULoxetine (CYMBALTA) 60 MG capsule TAKE ONE CAPSULE BY MOUTH DAILY  . fentaNYL (DURAGESIC - DOSED MCG/HR) 50 MCG/HR Place 1 patch (50 mcg total) onto the skin every other day.  . fluticasone (FLONASE) 50 MCG/ACT nasal spray Place 2 sprays into  both nostrils daily as needed for allergies.   . folic acid (FOLVITE) 1 MG tablet TAKE 1 TABLET BY MOUTH DAILY  . glucose blood test strip 1 each by Other route as needed for other. Use as instructed to check sugar once daily and as needed. **ONE TOUCH ULTRA** Dx: E11.9  . irbesartan (AVAPRO) 75 MG tablet Take 0.5 tablets (37.5 mg total) by mouth daily.  [name] loperamide (IMODIUM) 2 MG capsule Take 1 capsule (2 mg total) by mouth 4 (four) times daily as needed for diarrhea or loose stools.  . metoprolol succinate (TOPROL-XL) 25 MG 24 hr tablet TAKE 1/2 TABLET BY MOUTH ONCE DAILY  . Misc Natural Products (TART CHERRY ADVANCED) CAPS Take 1 capsule by mouth 2 (two) times daily.  . nitroGLYCERIN (NITROSTAT) 0.4 MG SL tablet Place 1 tablet (0.4 mg total) under the tongue every 5 (five) minutes as needed for chest pain.  [name] omeprazole (PRILOSEC) 40 MG capsule Take 1 capsule (40 mg total) by mouth daily.  . ondansetron (ZOFRAN-ODT) 4 MG disintegrating tablet DISSOLVE ONE OR TWO TABLETS IN THE MOUTHEVERY 8 HOURS AS NEEDED FOR NAUSEA OR VOMITING.  [name] oxyCODONE (ROXICODONE) 15 MG immediate release tablet Take 1 tablet (15 mg total) by mouth every 4 (four) hours as needed.  . polyethylene glycol powder (GLYCOLAX/MIRALAX) powder Take 17 g by mouth daily as needed for moderate constipation.  . vitamin B-12 (CYANOCOBALAMIN) 500 MCG tablet Take 500 mcg by mouth daily.   No facility-administered encounter medications on file as of 08/31/2016.     Activities of Daily Living In your present state of health, do you have any difficulty performing the following activities: 08/31/2016 11/30/2015  Hearing? N N    Vision? Y Y  Difficulty concentrating or making decisions? Y N  Walking or climbing stairs? Y Y  Dressing or bathing? Y Y  Doing errands, shopping? N Y  Conservation officer, nature and eating ? N -  Using the Toilet? N -  In the past six months, have you accidently leaked urine? Y -  Do you have problems with loss of bowel control? N -  Managing your Medications? Y -  Managing your Finances? Y -  Housekeeping or managing your Housekeeping? Y -  Some recent data might be hidden    Patient Care Team: Ria Bush, MD as PCP - General (Family Medicine) Lennon Alstrom, MD as Consulting Physician (Neurology) Lelon Perla, MD as Consulting Physician (Cardiology) Druscilla Brownie, MD as Consulting Physician (Dermatology) Glennie Isle, PA-C as Consulting Physician (Physician Assistant) Elam Dutch, MD as Consulting Physician (Vascular Surgery)   Assessment:     Hearing Screening   125Hz  250Hz  500Hz  1000Hz  2000Hz  3000Hz  4000Hz  6000Hz  8000Hz   Right ear:   40 40 40  40    Left ear:   40 0 40  40      Visual Acuity Screening   Right eye Left eye Both eyes  Without correction: 20/70 20/50 20/40   With correction:       Exercise Activities and Dietary recommendations Current Exercise Habits: The patient does not participate in regular exercise at present, Exercise limited by: orthopedic condition(s)  Goals    . safety          Starting 08/31/16, I will continue to use cane or walker to reduce of falls.       Fall Risk Fall Risk  08/31/2016 07/19/2015 07/19/2015 04/21/2014 06/07/2012  Falls in the past year? Yes (No Data) Yes Yes Yes  Number falls in past yr: 2 or more - 1 2 or more 2 or more  Injury with Fall? Yes - Yes - -  Risk Factor Category  - - High Fall Risk High Fall Risk -  Risk for fall due to : - - History of fall(s);Impaired balance/gait;Impaired mobility Impaired balance/gait;Impaired mobility Impaired balance/gait;Impaired mobility  Follow up - - Falls evaluation  completed;Education provided;Falls prevention discussed - -   Depression Screen PHQ 2/9 Scores 08/31/2016 07/19/2015 04/21/2014 06/07/2012  PHQ - 2 Score 4 0 0 1  PHQ- 9 Score 19 - - -    Cognitive Function MMSE - Mini Mental State Exam 08/31/2016 07/19/2015  Orientation to time 5 5  Orientation to Place 5 5  Registration 3 3  Attention/ Calculation 0 5  Recall 3 3  Language- name 2 objects 0 -  Language- repeat 1 1  Language- follow 3 step command 3 -  Language- read & follow direction 0 1  Write a sentence 0 -  Copy design 0 -  Total score 20 -     PLEASE NOTE: A Mini-Cog screen was completed. Maximum score is 20. A value of 0 denotes this part of Folstein MMSE was not completed or the patient failed this part of the Mini-Cog screening.   Mini-Cog Screening Orientation to Time - Max 5 pts Orientation to Place - Max 5 pts Registration - Max 3 pts Recall - Max 3 pts Language Repeat - Max 1 pts Language Follow 3 Step Command - Max 3 pts     Immunization History  Administered Date(s) Administered  . Hepatitis B 06/05/1988, 08/03/1988, 11/03/1988  . Influenza Split 05/11/2011, 04/23/2012  . Influenza Whole 06/05/2005, 06/05/2008, 05/03/2010  . Influenza,inj,Quad PF,36+ Mos 03/13/2013, 04/21/2014, 04/05/2015, 04/26/2016  . Pneumococcal Polysaccharide-23 06/05/2005  . Td 06/05/2005   Screening Tests Health Maintenance  Topic Date Due  . FOOT EXAM  09/08/2016 (Originally 06/17/2016)  . OPHTHALMOLOGY EXAM  08/31/2017 (Originally 10/25/2011)  . PNEUMOCOCCAL POLYSACCHARIDE VACCINE (2) 08/30/2021 (Originally 06/05/2010)  . DTaP/Tdap/Td (1 - Tdap) 06/04/2025 (Originally 06/06/2005)  . TETANUS/TDAP  06/04/2025 (Originally 06/06/2015)  . HEMOGLOBIN A1C  10/24/2016  . COLONOSCOPY  08/01/2019  . INFLUENZA VACCINE  Completed  . Hepatitis C Screening  Completed  . HIV Screening  Completed      Plan:     I have personally reviewed and addressed the Medicare Annual Wellness questionnaire  and have noted the following in the patient's chart:  A. Medical and social history B. Use of alcohol, tobacco or illicit drugs  C. Current medications and supplements D. Functional ability and status E.  Nutritional status F.  Physical activity G. Advance directives H. List of other physicians I.  Hospitalizations, surgeries, and ER visits in previous 12 months J.  [place] to include hearing, vision, cognitive, depression L. Referrals and appointments - none  In addition, I have reviewed and discussed with patient certain preventive protocols, quality metrics, and best practice recommendations. A written personalized care plan for preventive services as well as general preventive health recommendations were provided to patient.  See attached scanned questionnaire for additional information.   Signed,   Lindell Noe, MHA, BS, LPN Health Coach

## 2016-09-01 ENCOUNTER — Telehealth: Payer: Self-pay

## 2016-09-01 LAB — HIV ANTIBODY (ROUTINE TESTING W REFLEX): HIV: NONREACTIVE

## 2016-09-01 LAB — PREALBUMIN: PREALBUMIN: 7 mg/dL — AB (ref 21–43)

## 2016-09-01 NOTE — Telephone Encounter (Signed)
Post ED Visit - Positive Culture Follow-up  Culture report reviewed by antimicrobial stewardship pharmacist:  []  Elenor Quinones, Pharm.D. []  Heide Guile, Pharm.D., BCPS AQ-ID []  Parks Neptune, Pharm.D., BCPS []  Alycia Rossetti, Pharm.D., BCPS []  [place], Pharm.D., BCPS, AAHIVP []  Legrand Como, Pharm.D., BCPS, AAHIVP []  Salome Arnt, PharmD, BCPS []  Dimitri Ped, PharmD, BCPS []  Vincenza Hews, PharmD, BCPS [name] Pharm D Positive urine culture Treated with Cephalexin, organism sensitive to the same and no further patient follow-up is required at this time.  Genia Del 09/01/2016, 10:54 AM

## 2016-09-04 NOTE — Progress Notes (Signed)
I reviewed health advisor's note, was available for consultation, and agree with documentation and plan.  

## 2016-09-05 ENCOUNTER — Telehealth: Payer: Self-pay | Admitting: Family Medicine

## 2016-09-05 NOTE — Telephone Encounter (Signed)
Called and sch pt for 09/19/16.

## 2016-09-05 NOTE — Telephone Encounter (Signed)
Pt wife called to cancel his cpe appt on Friday 4/6. He is very sick. He has completed labs already but your next avail cpe is in May. Can I sch earlier?

## 2016-09-05 NOTE — Telephone Encounter (Signed)
May place in open 30 min slot. Thanks.

## 2016-09-08 ENCOUNTER — Encounter: Payer: Medicare PPO | Admitting: Family Medicine

## 2016-09-12 ENCOUNTER — Other Ambulatory Visit: Payer: Self-pay

## 2016-09-12 MED ORDER — FENTANYL 50 MCG/HR TD PT72: 50.0000 ug | MEDICATED_PATCH | TRANSDERMAL | 0 refills | Status: DC

## 2016-09-12 NOTE — Telephone Encounter (Signed)
Message left advising patient's wife and Rx placed up front for pick up.

## 2016-09-12 NOTE — Telephone Encounter (Signed)
[name] left v/m (DPR signed) requesting rx for Fentanyl patch. Call when ready for pick up. Last seen 06/28/16 and last printed # 15 on 08/14/16.

## 2016-09-12 NOTE — Telephone Encounter (Signed)
Printed and in [employer].

## 2016-09-15 ENCOUNTER — Other Ambulatory Visit: Payer: Self-pay | Admitting: Family Medicine

## 2016-09-15 NOTE — Telephone Encounter (Signed)
Ok to refill? Last filled 08/25/16 #20 0RF

## 2016-09-19 ENCOUNTER — Ambulatory Visit (INDEPENDENT_AMBULATORY_CARE_PROVIDER_SITE_OTHER): Payer: Medicare PPO | Admitting: Family Medicine

## 2016-09-19 ENCOUNTER — Encounter: Payer: Self-pay | Admitting: Family Medicine

## 2016-09-19 VITALS — BP 124/68 | HR 80 | Temp 98.0°F | Wt 302.2 lb

## 2016-09-19 DIAGNOSIS — E559 Vitamin D deficiency, unspecified: Secondary | ICD-10-CM | POA: Diagnosis not present

## 2016-09-19 DIAGNOSIS — D696 Thrombocytopenia, unspecified: Secondary | ICD-10-CM

## 2016-09-19 DIAGNOSIS — R892 Abnormal level of other drugs, medicaments and biological substances in specimens from other organs, systems and tissues: Secondary | ICD-10-CM

## 2016-09-19 DIAGNOSIS — K7460 Unspecified cirrhosis of liver: Secondary | ICD-10-CM

## 2016-09-19 DIAGNOSIS — Z6841 Body Mass Index (BMI) 40.0 and over, adult: Secondary | ICD-10-CM

## 2016-09-19 DIAGNOSIS — L732 Hidradenitis suppurativa: Secondary | ICD-10-CM

## 2016-09-19 DIAGNOSIS — Z0000 Encounter for general adult medical examination without abnormal findings: Secondary | ICD-10-CM

## 2016-09-19 DIAGNOSIS — E440 Moderate protein-calorie malnutrition: Secondary | ICD-10-CM

## 2016-09-19 DIAGNOSIS — E785 Hyperlipidemia, unspecified: Secondary | ICD-10-CM | POA: Diagnosis not present

## 2016-09-19 DIAGNOSIS — G8929 Other chronic pain: Secondary | ICD-10-CM

## 2016-09-19 DIAGNOSIS — K7581 Nonalcoholic steatohepatitis (NASH): Secondary | ICD-10-CM | POA: Diagnosis not present

## 2016-09-19 DIAGNOSIS — R7303 Prediabetes: Secondary | ICD-10-CM

## 2016-09-19 DIAGNOSIS — G894 Chronic pain syndrome: Secondary | ICD-10-CM | POA: Diagnosis not present

## 2016-09-19 DIAGNOSIS — E66813 Obesity, class 3: Secondary | ICD-10-CM

## 2016-09-19 DIAGNOSIS — Z7189 Other specified counseling: Secondary | ICD-10-CM

## 2016-09-19 MED ORDER — OXYCODONE HCL 15 MG PO TABS: 15.0000 mg | ORAL_TABLET | ORAL | 0 refills | Status: DC | PRN

## 2016-09-19 MED ORDER — DULOXETINE HCL 60 MG PO CPEP: 60.0000 mg | ORAL_CAPSULE | Freq: Every day | ORAL | 3 refills | Status: DC

## 2016-09-19 MED ORDER — OMEPRAZOLE 40 MG PO CPDR: 40.0000 mg | DELAYED_RELEASE_CAPSULE | Freq: Every day | ORAL | 6 refills | Status: DC

## 2016-09-19 MED ORDER — DULOXETINE HCL 60 MG PO CPEP: 60.0000 mg | ORAL_CAPSULE | Freq: Every day | ORAL | 6 refills | Status: DC

## 2016-09-19 NOTE — Assessment & Plan Note (Signed)
Reviewed narcotic regimen with patient. He sees Dr Sanjuan Dame pain management in [place] Q6 months.  CSRS reviewed 09/2016

## 2016-09-19 NOTE — Assessment & Plan Note (Signed)
Chronic, stable. Currently off statin.
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
## 2016-09-19 NOTE — Assessment & Plan Note (Signed)
Chronic. Continues to struggle with this. Has seen surgery and dermatology.

## 2016-09-19 NOTE — Assessment & Plan Note (Signed)
Albumin 2.8, prealbumin low at 7. Concern for nutritional deficiencies vs liver disease related. Will refer to nutritionist.

## 2016-09-19 NOTE — Assessment & Plan Note (Signed)
Reviewed with patient. Aware must avoid MJ use if I am to continue prescribing narcotics.

## 2016-09-19 NOTE — Patient Instructions (Addendum)
Update UDS.  We will refer you to nutritionist. We will refer you back to Dr Collene Mares. Pass by Rosaria Ferries our referral coordinator to schedule this.  PHQ9 today.  Check with insurance about new shingles shot Shingrix.  Look at advanced directive packet provided last visit.   Health Maintenance, Male A healthy lifestyle and preventive care is important for your health and wellness. Ask your health care provider about what schedule of regular examinations is right for you. What should I know about weight and diet?  Eat a Healthy Diet  Eat plenty of vegetables, fruits, whole grains, low-fat dairy products, and lean protein.  Do not eat a lot of foods high in solid fats, added sugars, or salt. Maintain a Healthy Weight  Regular exercise can help you achieve or maintain a healthy weight. You should:  Do at least 150 minutes of exercise each week. The exercise should increase your heart rate and make you sweat (moderate-intensity exercise).  Do strength-training exercises at least twice a week. Watch Your Levels of Cholesterol and Blood Lipids  Have your blood tested for lipids and cholesterol every 5 years starting at 63 years of age. If you are at high risk for heart disease, you should start having your blood tested when you are 62 years old. You may need to have your cholesterol levels checked more often if:  Your lipid or cholesterol levels are high.  You are older than 62 years of age.  You are at high risk for heart disease. What should I know about cancer screening? Many types of cancers can be detected early and may often be prevented. Lung Cancer  You should be screened every year for lung cancer if:  You are a current smoker who has smoked for at least 30 years.  You are a former smoker who has quit within the past 15 years.  Talk to your health care provider about your screening options, when you should start screening, and how often you should be screened. Colorectal  Cancer  Routine colorectal cancer screening usually begins at 62 years of age and should be repeated every 5-10 years until you are 62 years old. You may need to be screened more often if early forms of precancerous polyps or small growths are found. Your health care provider may recommend screening at an earlier age if you have risk factors for colon cancer.  Your health care provider may recommend using home test kits to check for hidden blood in the stool.  A small camera at the end of a tube can be used to examine your colon (sigmoidoscopy or colonoscopy). This checks for the earliest forms of colorectal cancer. Prostate and Testicular Cancer  Depending on your age and overall health, your health care provider may do certain tests to screen for prostate and testicular cancer.  Talk to your health care provider about any symptoms or concerns you have about testicular or prostate cancer. Skin Cancer  Check your skin from head to toe regularly.  Tell your health care provider about any new moles or changes in moles, especially if:  There is a change in a mole's size, shape, or color.  You have a mole that is larger than a pencil eraser.  Always use sunscreen. Apply sunscreen liberally and repeat throughout the day.  Protect yourself by wearing long sleeves, pants, a wide-brimmed hat, and sunglasses when outside. What should I know about heart disease, diabetes, and high blood pressure?  If you are 18-39 years of  age, have your blood pressure checked every 3-5 years. If you are 5 years of age or older, have your blood pressure checked every year. You should have your blood pressure measured twice-once when you are at a hospital or clinic, and once when you are not at a hospital or clinic. Record the average of the two measurements. To check your blood pressure when you are not at a hospital or clinic, you can use:  An automated blood pressure machine at a pharmacy.  A home blood  pressure monitor.  Talk to your health care provider about your target blood pressure.  If you are between 38-4 years old, ask your health care provider if you should take aspirin to prevent heart disease.  Have regular diabetes screenings by checking your fasting blood sugar level.  If you are at a normal weight and have a low risk for diabetes, have this test once every three years after the age of 2.  If you are overweight and have a high risk for diabetes, consider being tested at a younger age or more often.  A one-time screening for abdominal aortic aneurysm (AAA) by ultrasound is recommended for men aged 71-75 years who are current or former smokers. What should I know about preventing infection? Hepatitis B  If you have a higher risk for hepatitis B, you should be screened for this virus. Talk with your health care provider to find out if you are at risk for hepatitis B infection. Hepatitis C  Blood testing is recommended for:  Everyone born from [birth year] through 1966.  Anyone with known risk factors for hepatitis C. Sexually Transmitted Diseases (STDs)  You should be screened each year for STDs including gonorrhea and chlamydia if:  You are sexually active and are younger than 62 years of age.  You are older than 62 years of age and your health care provider tells you that you are at risk for this type of infection.  Your sexual activity has changed since you were last screened and you are at an increased risk for chlamydia or gonorrhea. Ask your health care provider if you are at risk.  Talk with your health care provider about whether you are at high risk of being infected with HIV. Your health care provider may recommend a prescription medicine to help prevent HIV infection. What else can I do?  Schedule regular health, dental, and eye exams.  Stay current with your vaccines (immunizations).  Do not use any tobacco products, such as cigarettes, chewing tobacco, and  e-cigarettes. If you need help quitting, ask your health care provider.  Limit alcohol intake to no more than 2 drinks per day. One drink equals 12 ounces of beer, 5 ounces of wine, or 1 ounces of hard liquor.  Do not use street drugs.  Do not share needles.  Ask your health care provider for help if you need support or information about quitting drugs.  Tell your health care provider if you often feel depressed.  Tell your health care provider if you have ever been abused or do not feel safe at home. This information is not intended to replace advice given to you by your health care provider. Make sure you discuss any questions you have with your health care provider. Document Released: 11/18/2007 Document Revised: 01/19/2016 Document Reviewed: 02/23/2015 Elsevier Interactive Patient Education  2017 [employer].

## 2016-09-19 NOTE — Assessment & Plan Note (Addendum)
Advanced directives: Wants wife to be HCPOA but not formally set up.

## 2016-09-19 NOTE — Assessment & Plan Note (Signed)
Reports compliance with vit D 2000 IU replacement - continue.

## 2016-09-19 NOTE — Assessment & Plan Note (Signed)
Stable period, latest plt 123. Anticipate related to liver disease

## 2016-09-19 NOTE — Progress Notes (Signed)
Pre visit review using our clinic review tool, if applicable. No additional management support is needed unless otherwise documented below in the visit note. 

## 2016-09-19 NOTE — Assessment & Plan Note (Signed)
Labs remain stable off medication - in prediabetes range - will change diagnosis

## 2016-09-19 NOTE — Assessment & Plan Note (Addendum)
I again askied patient to return to Dr Collene Mares. missed last appt 06/2016.  Discussed liver cirrhosis and importance of close GI f/u. Known thrombocytopenia, anemia, hypoalbuminemia.  Continue omeprazole 35m daily.

## 2016-09-19 NOTE — Assessment & Plan Note (Signed)
Preventative protocols reviewed and updated unless pt declined. Discussed healthy diet and lifestyle.  

## 2016-09-19 NOTE — Assessment & Plan Note (Addendum)
Poor functional status, limited by pain. Refer to nutritionist for further assistance.

## 2016-09-20 ENCOUNTER — Encounter: Payer: Self-pay | Admitting: Family Medicine

## 2016-09-20 DIAGNOSIS — Z79891 Long term (current) use of opiate analgesic: Secondary | ICD-10-CM | POA: Diagnosis not present

## 2016-09-26 NOTE — Addendum Note (Signed)
Addended by: Ria Bush on: 09/26/2016 01:21 PM   Modules accepted: Orders

## 2016-09-28 ENCOUNTER — Encounter: Payer: Self-pay | Admitting: Gastroenterology

## 2016-10-02 ENCOUNTER — Other Ambulatory Visit: Payer: Self-pay | Admitting: Family Medicine

## 2016-10-02 NOTE — Telephone Encounter (Signed)
Last Rx 04/2015. Last OV 09/2016-CPE

## 2016-10-04 ENCOUNTER — Encounter (INDEPENDENT_AMBULATORY_CARE_PROVIDER_SITE_OTHER): Payer: Self-pay

## 2016-10-04 ENCOUNTER — Other Ambulatory Visit (INDEPENDENT_AMBULATORY_CARE_PROVIDER_SITE_OTHER): Payer: Medicare PPO

## 2016-10-04 ENCOUNTER — Encounter: Payer: Self-pay | Admitting: Gastroenterology

## 2016-10-04 ENCOUNTER — Ambulatory Visit (INDEPENDENT_AMBULATORY_CARE_PROVIDER_SITE_OTHER): Payer: Medicare PPO | Admitting: Gastroenterology

## 2016-10-04 VITALS — BP 138/70 | HR 82 | Ht 68.0 in | Wt 302.2 lb

## 2016-10-04 DIAGNOSIS — K7460 Unspecified cirrhosis of liver: Secondary | ICD-10-CM

## 2016-10-04 DIAGNOSIS — E440 Moderate protein-calorie malnutrition: Secondary | ICD-10-CM | POA: Diagnosis not present

## 2016-10-04 LAB — COMPREHENSIVE METABOLIC PANEL
ALBUMIN: 2.9 g/dL — AB (ref 3.5–5.2)
ALT: 12 U/L (ref 0–53)
AST: 25 U/L (ref 0–37)
Alkaline Phosphatase: 124 U/L — ABNORMAL HIGH (ref 39–117)
BILIRUBIN TOTAL: 0.9 mg/dL (ref 0.2–1.2)
BUN: 14 mg/dL (ref 6–23)
CO2: 26 meq/L (ref 19–32)
CREATININE: 0.78 mg/dL (ref 0.40–1.50)
Calcium: 9 mg/dL (ref 8.4–10.5)
Chloride: 104 mEq/L (ref 96–112)
GFR: 107.34 mL/min (ref >60.00)
Glucose, Bld: 139 mg/dL — ABNORMAL HIGH (ref 70–99)
Potassium: 4.1 mEq/L (ref 3.5–5.1)
SODIUM: 136 meq/L (ref 135–145)
Total Protein: 7.5 g/dL (ref 6.0–8.3)

## 2016-10-04 LAB — CBC WITH DIFFERENTIAL/PLATELET
Basophils Absolute: 0.1 10*3/uL (ref 0.0–0.1)
Basophils Relative: 1.3 % (ref 0.0–3.0)
EOS ABS: 0.1 10*3/uL (ref 0.0–0.7)
EOS PCT: 3 % (ref 0.0–5.0)
HCT: 33.8 % — ABNORMAL LOW (ref 39.0–52.0)
Hemoglobin: 11.3 g/dL — ABNORMAL LOW (ref 13.0–17.0)
LYMPHS ABS: 1.1 10*3/uL (ref 0.7–4.0)
Lymphocytes Relative: 25.5 % (ref 12.0–46.0)
MCHC: 33.5 g/dL (ref 30.0–36.0)
MCV: 95.3 fl (ref 78.0–100.0)
Monocytes Absolute: 0.2 10*3/uL (ref 0.1–1.0)
Monocytes Relative: 4.6 % (ref 3.0–12.0)
Neutro Abs: 2.7 10*3/uL (ref 1.4–7.7)
Neutrophils Relative %: 65.6 % (ref 43.0–77.0)
Platelets: 77 10*3/uL — ABNORMAL LOW (ref 150.0–400.0)
RBC: 3.55 Mil/uL — AB (ref 4.22–5.81)
RDW: 16.7 % — ABNORMAL HIGH (ref 11.5–15.5)
WBC: 4.2 10*3/uL (ref 4.0–10.5)

## 2016-10-04 LAB — PROTIME-INR
INR: 1.2 ratio — AB (ref 0.8–1.0)
PROTHROMBIN TIME: 13.1 s (ref 9.6–13.1)

## 2016-10-04 NOTE — Progress Notes (Signed)
Initial assessment and plan as noted. Unhealthy gentleman with multiple problems as outlined. Previous GI care elsewhere. Janett Billow, obtain outside records as you have planned and have him follow up with you for the next visit. He can update his history with those records at that time and summarize your workup. He could see me thereafter, particularly if he needs screening EGD. Thanks

## 2016-10-04 NOTE — Progress Notes (Signed)
10/04/2016 [name] [id number] [birth year]   HISTORY OF PRESENT ILLNESS:  This is a 61 year old male who is new to our office. He used to follow with Dr. Collene Mares, but apparently missed some visits and she will not longer see him. His medical history includes abdominal aortic aneurysm which was grafted in June 2017, coronary artery disease with stents and most recent EF of 40%, multiple back surgeries, Charcot Marie tooth muscular atrophy, chronic pain syndrome on high doses of narcotics to be seen at the pain clinic soon, severe hidradenitis in his groin/scrotum that has been recurrent, hypertension, hyperlipidemia, type 2 diabetes mellitus.  He has cirrhosis, which has apparently been attributed to [place]. He denies ever being a drinker. We are going to obtain records from Dr. Collene Mares. Most recent abdominal imaging in January 2018 showed cirrhosis and a stable mildly dilated common bile duct. No suggestion of liver lesions.  Labs from January show some thrombocytopenia and a mild coagulopathy with INR 1.2. The patient denies any lower extremity swelling. His weight has actually been going down rather than going up. His wife says that he does not want to eat. Apparently he is going to see nutritionist soon as well. His prealbumin is 7. He denies any dark or bloody stools, but admits that a couple months ago he had an episode where he vomited some small amounts of blood, but it resolved. He has never had an EGD to his knowledge. He has an abdominal aneurysm that had some repair by Dr. Trula Slade in 11/2015 and since that time he says that he has had abdominal pain. He's had multiple imaging studies of his abdomen without any other source of pain identified.  Recently had a positive drug screen for THC.   Past Medical History:  Diagnosis Date  . AAA (abdominal aortic aneurysm) ([place]) 09/2012--  monitored by dr Trula Slade   stable 5.6cm CTA abdomen 2016  . Abnormal drug screen 07/09/2016   1/2/018 -  positive oxycodone, fentanyl, inapprop positive MJ - mod risk  . Allergic rhinitis   . B12 deficiency   . CAD (coronary artery disease) cardiologist-  dr Stanford Breed   x3 with stents last 2005, EF 40%, predominantly RCA by CT 2016  . Cataracts, bilateral   . Cervical spondylosis 05/2010   s/p surgery  . Charcot Marie Tooth muscular atrophy dx  [phone]   neurologist--  dr love--  type 2 per pt  . Chronic pain syndrome    [facility] with [facility] --> disagreement and transfered care to Dr Sanjuan Dame at [facility] clinic [facility], requests PCP write Rx but f/u with pain clinic Q6-12 months  . COPD (chronic obstructive pulmonary disease) ([place]) 10/2011   minimal by PFTs  . DDD (degenerative disc disease)   . Disturbances of sensation of smell and taste    improving  . Dyspnea on exertion   . GERD (gastroesophageal reflux disease)   . Gout   . Headache   . Hepatitis    hepatitis B  . Hidradenitis    right groin  . Hidradenitis suppurativa dx 2011   goin and leg crease   followd by Lyndle Herrlich - daily bactrim, s/p intralesional steroid injection 10/2010  . Hip osteoarthritis    s/p intraarticular steroid shot (12/2012) ([place])  . History of hepatitis B 1983  . History of MI (myocardial infarction)    2000  &  2005  . History of pneumonia   . History of viral meningitis  2000  . HLD (hyperlipidemia)   . HTN (hypertension)   . Ischemic cardiomyopathy    s/p inferior MI  --  current ef per myoview 39%  . Liver cirrhosis secondary to NASH ([place]) 01/2014   by CT scan, rec virtual colonoscopy by Dr Collene Mares 06/2014  . Lumbar herniated disc   . Myocardial infarction ([place])    x2  . Nocturia more than twice per night   . Obesity   . Spinal stenosis    released from [place].  established with [facility] pain (07/2013)  . T2DM (type 2 diabetes mellitus) ([place])    ABIs WNL 2016  . Vitamin D deficiency    Past Surgical History:  Procedure Laterality Date  . ABDOMINAL AORTIC  ENDOVASCULAR FENESTRATED STENT GRAFT N/A 11/30/2015   Procedure: ABDOMINAL AORTIC ENDOVASCULAR FENESTRATED STENT GRAFT;  Surgeon: Serafina Mitchell, MD;  Location: [facility];  Service: Vascular;  Laterality: N/A;  . ANTERIOR CERVICAL DECOMP/DISCECTOMY FUSION  01-07-2010    C4 -- C7  . CARDIAC CATHETERIZATION  03-30-2005  dr Albertine Patricia   ef 40% w/ inferior akinesis/  LM and CFX angiographically normal/  pLAD 30%/   Widely patent stents in RCA and PDA widely patent  . CARDIOVASCULAR STRESS TEST  10-23-2012  dr Stanford Breed   No ischemia/  Moderate scar in the inferior wall, otherwise normal perfusion/  LV ef 39%,  LV wall motion: inferior/ inferolateral hypokinesis  . COLONOSCOPY  05/06/2007   normal, small int hemorrhoids rpt 5 yrs due to fmhx - rec against rpt colonoscopy by Dr Collene Mares  . CORONARY ANGIOPLASTY  2000  dr Stanford Breed   PCI to RCA and PDA  . CORONARY ANGIOPLASTY WITH STENT PLACEMENT  03-19-2005  dr Gwyndolyn Saxon downey   inferior STEMI--- DES x4 to RCA w/ balloon angioplasty and balloon angioplasty to jailed PDA ostium/  severe hypokinesis of midinferor wall, ef 50%/  dLM 20%,  mLAD 20%,  dCFX 60%  . HYDRADENITIS EXCISION Right 12/31/2014   Procedure: WIDE EXCISION HIDRADENITIS GROIN; Coralie Keens, MD  . LUMBAR DISC SURGERY     L5-S1  . LUMBAR LAMINECTOMY  05-18-2010   L2--5   laminectomy/foraminotomy for stenosis Joya Salm)  . MYELOGRAM     L5-S1 and L1-2 spondylosis  . SACROILIAC JOINT INJECTION Bilateral 10/2013   [place]  . [place]    reports that he has been smoking Cigarettes.  He started smoking about 49 years ago. He has a 57.00 pack-year smoking history. He has never used smokeless tobacco. He reports that he uses drugs, including Fentanyl. He reports that he does not drink alcohol. family history includes Aneurysm in his brother and mother; Cancer in his brother, brother, father, and mother; Charcot-Marie-Tooth disease in his mother; Coronary artery disease in his  brother; Diabetes in his mother; Heart attack in his father; Heart disease in his father and mother; Kidney disease in his mother; Rheum arthritis in his brother, mother, and sister. Allergies  Allergen Reactions  . Statins Shortness Of Breath    Cough, trouble breathing  . Losartan Other (See Comments)    Causes him to have pain  . Allopurinol Nausea Only  . Baclofen Nausea And Vomiting  . Gabapentin Nausea And Vomiting      Outpatient Encounter Prescriptions as of 10/04/2016  Medication Sig  . albuterol (PROVENTIL HFA;VENTOLIN HFA) 108 (90 Base) MCG/ACT inhaler Inhale 2 puffs into the lungs every 6 (six) hours as needed for wheezing or shortness of breath.  [name] albuterol (  PROVENTIL) (2.5 MG/3ML) 0.083% nebulizer solution USE 1 VIAL PER NEBULIZER EVERY 6 HRS AS NEEDED FOR WHEEZING  . aspirin 325 MG tablet Take 1 tablet (325 mg total) by mouth daily.  . cholecalciferol 2000 units TABS Take 2,000 Units by mouth daily.  [name] dicyclomine (BENTYL) 20 MG tablet Take 1 tablet (20 mg total) by mouth 3 (three) times daily before meals. As needed for abdominal pain  . DULoxetine (CYMBALTA) 60 MG capsule Take 1 capsule (60 mg total) by mouth daily.  . fentaNYL (DURAGESIC - DOSED MCG/HR) 50 MCG/HR Place 1 patch (50 mcg total) onto the skin every other day.  . fluticasone (FLONASE) 50 MCG/ACT nasal spray Place 2 sprays into both nostrils daily as needed for allergies.   . folic acid (FOLVITE) 1 MG tablet TAKE 1 TABLET BY MOUTH DAILY  . glucose blood test strip 1 each by Other route as needed for other. Use as instructed to check sugar once daily and as needed. **ONE TOUCH ULTRA** Dx: E11.9  . irbesartan (AVAPRO) 75 MG tablet TAKE 1/2 TABLET BY MOUTH ONCE DAILY  . loperamide (IMODIUM) 2 MG capsule Take 1 capsule (2 mg total) by mouth 4 (four) times daily as needed for diarrhea or loose stools.  . metoprolol succinate (TOPROL-XL) 25 MG 24 hr tablet TAKE 1/2 TABLET BY MOUTH ONCE DAILY  . Misc Natural Products  (TART CHERRY ADVANCED) CAPS Take 1 capsule by mouth 2 (two) times daily.  . nitroGLYCERIN (NITROSTAT) 0.4 MG SL tablet Place 1 tablet (0.4 mg total) under the tongue every 5 (five) minutes as needed for chest pain.  [name] omeprazole (PRILOSEC) 40 MG capsule Take 1 capsule (40 mg total) by mouth daily.  . ondansetron (ZOFRAN-ODT) 4 MG disintegrating tablet DISSOLVE ONE OR TWO TABLETS IN THE MOUTHEVERY 8 HOURS AS NEEDED FOR NAUSEA OR VOMITING.  [name] oxyCODONE (ROXICODONE) 15 MG immediate release tablet Take 1 tablet (15 mg total) by mouth every 4 (four) hours as needed.  . polyethylene glycol powder (GLYCOLAX/MIRALAX) powder Take 17 g by mouth daily as needed for moderate constipation.  . vitamin B-12 (CYANOCOBALAMIN) 500 MCG tablet Take 500 mcg by mouth daily.  . [DISCONTINUED] irbesartan (AVAPRO) 75 MG tablet Take 0.5 tablets (37.5 mg total) by mouth daily.   No facility-administered encounter medications on file as of 10/04/2016.      REVIEW OF SYSTEMS  : All other systems reviewed and negative except where noted in the History of Present Illness.   PHYSICAL EXAM: Ht 5' 8"  (1.727 m)   Wt (!) 302 lb 4 oz (137.1 kg)   BMI 45.96 kg/m  General: Well developed white male in no acute distress; appears older than stated age and disheveled. Head: Normocephalic and atraumatic Eyes:  Sclerae anicteric, conjunctiva pink. Ears: Normal auditory acuity Lungs: Clear throughout to auscultation; no increased WOB. Heart: Regular rate and rhythm Abdomen: Soft, obese, non-distended. Normal bowel sounds.  Non-tender. Musculoskeletal:  Muscle wasting on the right. Skin: No lesions on visible extremities Extremities: No edema  Neurological: Alert oriented x 4, grossly non-focal; no asterixis Psychological:  Alert and cooperative. Normal mood and affect  ASSESSMENT AND PLAN: -Cirrhosis, reportedly due to NASH:  Appears to be compensated from that I can tell.  Has some thrombocytopenia, mild coagulopathy.  MELD in  January was 10.  We need to get records from Dr. Collene Mares since he used to follow with her.  I am going to get some updated labs today including CBC, CMP, PT/INR, and AFP.  Up-to-date with imaging.  He likely will need an EGD in the near future to screen for varices as he does not recall having one in the past.   -Abdominal pain:  Pain began after aneurysm surgery.  No imaging has suggested ascites.  Patient's weight is actually going down.  Has had CT scans, U/S with nothing to account for pain.  Is on high doses of pain medications and has appt to see pain management soon. -Protein-calorie malnutrition:  Encouraged to drink Ensures, etc.  Seeing a nutritionist soon.  *Follow-up in 6-8 weeks.  CC:  Ria Bush, MD

## 2016-10-05 LAB — AFP TUMOR MARKER: AFP TUMOR MARKER: 8.1 ng/mL — AB (ref <6.1)

## 2016-10-13 ENCOUNTER — Other Ambulatory Visit: Payer: Self-pay

## 2016-10-13 NOTE — Telephone Encounter (Signed)
Mrs Po left v/m (DPR signed) requesting rx fentanyl patch. Last printed # 15 on 09/12/16. Last annual 09/19/16. Request cb when ready for pick up.

## 2016-10-16 ENCOUNTER — Other Ambulatory Visit: Payer: Self-pay

## 2016-10-16 MED ORDER — OXYCODONE HCL 15 MG PO TABS: 15.0000 mg | ORAL_TABLET | ORAL | 0 refills | Status: DC | PRN

## 2016-10-16 MED ORDER — FENTANYL 50 MCG/HR TD PT72: 50.0000 ug | MEDICATED_PATCH | TRANSDERMAL | 0 refills | Status: DC

## 2016-10-16 NOTE — Telephone Encounter (Signed)
Wife notified Rx ready for pick up

## 2016-10-16 NOTE — Telephone Encounter (Signed)
Pt notified Rx ready for pickup 

## 2016-10-16 NOTE — Telephone Encounter (Signed)
Printed and in [place] box.

## 2016-10-16 NOTE — Telephone Encounter (Signed)
[name] [facility] signed) left v/m requesting rx oxycodone. Last printed # 180 on 09/19/16 and last seen 09/19/16. [name] wants to pick up oxycodone rx when picks up fentanyl patch on 10/16/16.

## 2016-10-16 NOTE — Telephone Encounter (Signed)
Printed and in my CMA box.

## 2016-10-19 DIAGNOSIS — M25551 Pain in right hip: Secondary | ICD-10-CM | POA: Diagnosis not present

## 2016-10-19 DIAGNOSIS — M1611 Unilateral primary osteoarthritis, right hip: Secondary | ICD-10-CM | POA: Diagnosis not present

## 2016-10-23 ENCOUNTER — Ambulatory Visit: Payer: Medicare PPO | Admitting: Dietician

## 2016-11-02 ENCOUNTER — Other Ambulatory Visit: Payer: Self-pay | Admitting: Family Medicine

## 2016-11-02 DIAGNOSIS — Z79891 Long term (current) use of opiate analgesic: Secondary | ICD-10-CM | POA: Diagnosis not present

## 2016-11-02 DIAGNOSIS — F17200 Nicotine dependence, unspecified, uncomplicated: Secondary | ICD-10-CM | POA: Diagnosis not present

## 2016-11-02 DIAGNOSIS — M545 Low back pain: Secondary | ICD-10-CM | POA: Diagnosis not present

## 2016-11-02 DIAGNOSIS — Z716 Tobacco abuse counseling: Secondary | ICD-10-CM | POA: Diagnosis not present

## 2016-11-02 DIAGNOSIS — M542 Cervicalgia: Secondary | ICD-10-CM | POA: Diagnosis not present

## 2016-11-02 DIAGNOSIS — Z720 Tobacco use: Secondary | ICD-10-CM | POA: Diagnosis not present

## 2016-11-02 DIAGNOSIS — Z87891 Personal history of nicotine dependence: Secondary | ICD-10-CM | POA: Diagnosis not present

## 2016-11-02 DIAGNOSIS — G894 Chronic pain syndrome: Secondary | ICD-10-CM | POA: Diagnosis not present

## 2016-11-02 NOTE — Telephone Encounter (Signed)
Electronic refill request. Last office visit:   09/09/16 Last Filled:    20 tablet 0 09/15/2016  Please advise.

## 2016-11-03 ENCOUNTER — Encounter: Payer: Medicare PPO | Attending: Family Medicine | Admitting: Dietician

## 2016-11-03 ENCOUNTER — Encounter: Payer: Self-pay | Admitting: Dietician

## 2016-11-03 VITALS — Ht 67.0 in | Wt 289.0 lb

## 2016-11-03 DIAGNOSIS — R112 Nausea with vomiting, unspecified: Secondary | ICD-10-CM

## 2016-11-03 DIAGNOSIS — K7469 Other cirrhosis of liver: Secondary | ICD-10-CM

## 2016-11-03 DIAGNOSIS — Z713 Dietary counseling and surveillance: Secondary | ICD-10-CM | POA: Diagnosis not present

## 2016-11-03 DIAGNOSIS — E440 Moderate protein-calorie malnutrition: Secondary | ICD-10-CM | POA: Insufficient documentation

## 2016-11-03 DIAGNOSIS — Z6841 Body Mass Index (BMI) 40.0 and over, adult: Secondary | ICD-10-CM

## 2016-11-03 DIAGNOSIS — K089 Disorder of teeth and supporting structures, unspecified: Secondary | ICD-10-CM

## 2016-11-03 DIAGNOSIS — K7581 Nonalcoholic steatohepatitis (NASH): Secondary | ICD-10-CM | POA: Diagnosis not present

## 2016-11-03 NOTE — Patient Instructions (Signed)
   Eat a small meal or snack 3 times a day.   Keep saltine crackers beside your bed or seat and 2-3 to help settle your stomach when feeling bad.   OK to have a nutrition drink such as Glucerna, Premier Protein (add a fruit or bread/ crackers), or Carnation Breakfast drink, when you don't feel you can eat solid food.   Make sure to chew foods thoroughly, and take small bites to help digestion. Make mealtimes pleasant and relaxed.   Sit up for an hour or more after eating.   Drink fluids between meals, at least 4oz at a time. Try flavored waters, crystal light, etc. Drink sugar free drinks.

## 2016-11-03 NOTE — Progress Notes (Signed)
Medical Nutrition Therapy: Visit start time: 0762  end time: 1210  Assessment:  Diagnosis: cirrhosis secondary to NASH, moderate PCM, obesity Past Medical history: Charcot-Marie-Tooth with muscular atrophy; gout; nausea and vomiting, abdominal pain Psychosocial issues/ stress concerns: recent mild depression due to death of close friend  Current weight: 289.0lbs  Height: 5'8" Medications, supplement changes: reconciled list in medical record  Progress and evaluation: Patient and wife report that he has had poor appetite in recent months, due both to pain and episodes of nausea, occasionally with vomiting. He also reports foods often feeling stuck in his esophagus that he has to cough back up. He has lost about 20lbs recently since his appetite has decreased; he is sleeping a great deal during the day, but is often unable to sleep well at night due to pain. Wife voices concern over inadequate fluid intake. Patient has a gastroenterology appointment in 2 weeks.   Physical activity: none due to muscle atrophy, pain  Dietary Intake:  Usual eating pattern includes 1 meals and sporadic snacks per day. Dining out frequency: 2-4 meals per week.  Breakfast: none Snack: none, rarely fruit bar-- apple cinnamon Lunch: sometimes mcdouble burger, no fries; grilled chicken sandwich; [place] and cheese sandwich on honey wheat bread with few chips; occasionally frozen meals; bojangles roasted bites Snack: none Supper: often none (wife works second shift); occasionally steak, chicken, or fish, + vegetables, salads Snack: rarely ice cream, occasionally cottage cheese and fruit, or peanut butter  Beverages: milk up to 2 gallons per week; regular coke or pepsi, rarely water  Nutrition Care Education: Topics covered: basic nutrition, weight management, PCM Basic nutrition: basic food groups, appropriate nutrient balance, appropriate meal and snack schedule, general nutrition guidelines    Weight control:  benefits of weight control, tracking food intake to increase awareness of daily food and beverage consumption; importance of eating at regular intervals. Protein-calorie malnutrition: importance of adequate nutrition for energy and overall health, including muscle health. Discussed strategies for controlling nausea, for making healthy choices convenient; encouraged eating small meals and/or snacks at least 3 times a day; discussed easy-to-prepare meal and snack options. Encouraged patient to keep a food diary noting food and beverage intake as well as any GI symptoms. Discussed eating slowly, chewing foods thoroughly, and importance of relaxing while eating to aid digestion.    Nutritional Diagnosis:  [place]-1.2 Biting/Chewing (masticatory) difficulty As related to poor dentition.  As evidenced by physical exam, patient report. [place]-3.3 Overweight/obesity As related to inactivity, history of excess calories.  As evidenced by BMI 43.8, patient report. [place]-5.2 Malnutrition As related to erratic eating behavior, infrequent meals.  As evidenced by MD and patient's reports.  Intervention: Instruction as noted above.   Set goals with input from patient and spouse.   Patient will return for follow-up after GI appointment.   Education Materials given:  . Plate Planner . Food lists/ Planning A Balanced Meal . Sample meal pattern/ menus . Goals/ instructions  Learner/ who was taught:  . Patient  . Spouse/ partner: wife Robyn Nohr  Level of understanding: [name] Verbalizes/ demonstrates competency   Demonstrated degree of understanding via:   Teach back Learning barriers: . None  Willingness to learn/ readiness for change: . Acceptance, ready for change  Monitoring and Evaluation:  Dietary intake, exercise, GI symptoms, and body weight      follow up: 11/29/16

## 2016-11-11 ENCOUNTER — Telehealth: Payer: Self-pay | Admitting: Family Medicine

## 2016-11-11 NOTE — Telephone Encounter (Signed)
Re evaluation by Dr Sanjuan Dame pain management 10/2016 - rec reduced fentanyl dose next Rx (fentanyl 57mg patch Q48 hrs alternating with fentanyl 185m patch Q48 hrs QOD), continue oxycodone 1595m-6 tab/day as currently - due to concern for micturition difficulties - see scanned Rx.

## 2016-11-13 ENCOUNTER — Other Ambulatory Visit: Payer: Self-pay

## 2016-11-13 NOTE — Telephone Encounter (Signed)
Mrs Lederman left v/m requesting rx oxycodone (last printed # 180 on 10/16/16 and fentanyl patch (last printed # 15 on 10/16/16), last seen 09/19/16. ? Is it too early?Please advise.

## 2016-11-14 MED ORDER — OXYCODONE HCL 15 MG PO TABS: 15.0000 mg | ORAL_TABLET | ORAL | 0 refills | Status: DC | PRN

## 2016-11-14 MED ORDER — FENTANYL 25 MCG/HR TD PT72: 25.0000 ug | MEDICATED_PATCH | TRANSDERMAL | 0 refills | Status: DC

## 2016-11-14 MED ORDER — FENTANYL 50 MCG/HR TD PT72: 50.0000 ug | MEDICATED_PATCH | TRANSDERMAL | 0 refills | Status: DC

## 2016-11-14 NOTE — Telephone Encounter (Signed)
Patient advised.  Rx left at front desk for pick up. 

## 2016-11-14 NOTE — Telephone Encounter (Signed)
Will refill according to latest pain management instructions Dr Sanjuan Dame 10/2016:  1. Continue current oxycodone dose and #.  2. Reduce fentanyl dose next Rx (fentanyl 51mg patch Q48 hrs alternating with fentanyl 170m patch Q48 hrs QOD) - due to concern for micturition difficulties - see scanned Rx.   Rx printed and in [facility]. plz remind pt of fentanyl change on next Rx.

## 2016-11-15 ENCOUNTER — Ambulatory Visit (INDEPENDENT_AMBULATORY_CARE_PROVIDER_SITE_OTHER): Payer: Medicare PPO | Admitting: Internal Medicine

## 2016-11-15 ENCOUNTER — Encounter: Payer: Self-pay | Admitting: Internal Medicine

## 2016-11-15 VITALS — Ht 68.0 in | Wt 290.0 lb

## 2016-11-15 DIAGNOSIS — R109 Unspecified abdominal pain: Secondary | ICD-10-CM | POA: Diagnosis not present

## 2016-11-15 DIAGNOSIS — R1310 Dysphagia, unspecified: Secondary | ICD-10-CM

## 2016-11-15 DIAGNOSIS — K7460 Unspecified cirrhosis of liver: Secondary | ICD-10-CM

## 2016-11-15 NOTE — Patient Instructions (Addendum)
You have been scheduled for an endoscopy. Please follow written instructions given to you at your visit today. If you use inhalers (even only as needed), please bring them with you on the day of your procedure.

## 2016-11-15 NOTE — Progress Notes (Signed)
HISTORY OF PRESENT ILLNESS:  [name] is a 61 y.o. male with multiple significant and serious medical problems including but not limited to coronary artery disease with decreased cardiac output, COPD, morbid obesity, hepatic cirrhosis secondary to NASH, GERD, hypertension, hyperlipidemia, and chronic pain syndrome on chronic narcotics who presents today for follow-up regarding his hepatic cirrhosis. Other complaints include chronic abdominal pain, decreased appetite, dysphagia, and weight loss. He was seen by the GI physician assistant 10/04/2016. Neither the patient nor his wife, who accompanies him, remembers that they were here for that visit. Patient's wife is quick to let me know that she has some basic medical background and throughout the course of today's evaluation constantly and randomly brings up different aspects of the patient's complicated medical history. In any event, we did get some outside records from Dr. Collene Mares (from whose practice he was discharged for noncompliance). In short, he had a workup for chronic liver disease with the conclusion being NASH cirrhosis. He did undergo colonoscopy in December 2008 which was normal. Virtual colonoscopy in 2016 was negative except for mild diverticulosis. No prior upper endoscopy as best we can tell. He denies upper endoscopy. He does tell me that his problems with intermittent solid food dysphagia have been going on approximately 1 year. Of interest, he did undergo barium esophagram 2 years ago with no significant abnormalities. Terms of his abdominal pain, it is generalized. No specific exacerbating or relieving factors. He dates his problem back to having his abdominal aortic aneurysm grafting. He describes his bowel habits is regular despite significant amounts of narcotics. He does use MiraLAX however occasionally.. Blood work at the time of his last visit. Current calculated MELD score is 10. No problems with edema, bleeding, or  encephalopathy. He is on metoprolol in addition to other medications as listed  REVIEW OF SYSTEMS:  All non-GI ROS negative except for back pain, fatigue, muscle cramps, ankle edema  Past Medical History:  Diagnosis Date  . AAA (abdominal aortic aneurysm) ([place]) 09/2012--  monitored by dr Trula Slade   stable 5.6cm CTA abdomen 2016  . Abnormal drug screen 07/09/2016   1/2/018 - positive oxycodone, fentanyl, inapprop positive MJ - mod risk  . Allergic rhinitis   . B12 deficiency   . CAD (coronary artery disease) cardiologist-  dr Stanford Breed   x3 with stents last 2005, EF 40%, predominantly RCA by CT 2016  . Cataracts, bilateral   . Cervical spondylosis 05/2010   s/p surgery  . Charcot Marie Tooth muscular atrophy dx  [phone]   neurologist--  dr love--  type 2 per pt  . Chronic pain syndrome    [facility] with [facility] --> disagreement and transfered care to Dr Sanjuan Dame at [facility] clinic [facility], requests PCP write Rx but f/u with pain clinic Q6-12 months  . COPD (chronic obstructive pulmonary disease) ([place]) 10/2011   minimal by PFTs  . DDD (degenerative disc disease)   . Disturbances of sensation of smell and taste    improving  . Dyspnea on exertion   . GERD (gastroesophageal reflux disease)   . Gout   . Headache   . Hepatitis    hepatitis B  . Hidradenitis    right groin  . Hidradenitis suppurativa dx 2011   goin and leg crease   followd by Lyndle Herrlich - daily bactrim, s/p intralesional steroid injection 10/2010  . Hip osteoarthritis    s/p intraarticular steroid shot (12/2012) ([place])  . History of hepatitis B 1983  .  History of MI (myocardial infarction)    2000  &  2005  . History of pneumonia   . History of viral meningitis 2000  . HLD (hyperlipidemia)   . HTN (hypertension)   . Ischemic cardiomyopathy    s/p inferior MI  --  current ef per myoview 39%  . Liver cirrhosis secondary to NASH ([place]) 01/2014   by CT scan, rec virtual colonoscopy by  Dr Collene Mares 06/2014  . Lumbar herniated disc   . Myocardial infarction ([place])    x2  . Nocturia more than twice per night   . Obesity   . Spinal stenosis    released from [place].  established with [facility] (07/2013)  . T2DM (type 2 diabetes mellitus) ([place])    ABIs WNL 2016  . Vitamin D deficiency     Past Surgical History:  Procedure Laterality Date  . ABDOMINAL AORTIC ENDOVASCULAR FENESTRATED STENT GRAFT N/A 11/30/2015   Procedure: ABDOMINAL AORTIC ENDOVASCULAR FENESTRATED STENT GRAFT;  Surgeon: Serafina Mitchell, MD;  Location: [facility];  Service: Vascular;  Laterality: N/A;  . ANTERIOR CERVICAL DECOMP/DISCECTOMY FUSION  01-07-2010    C4 -- C7  . CARDIAC CATHETERIZATION  03-30-2005  dr Albertine Patricia   ef 40% w/ inferior akinesis/  LM and CFX angiographically normal/  pLAD 30%/   Widely patent stents in RCA and PDA widely patent  . CARDIOVASCULAR STRESS TEST  10-23-2012  dr Stanford Breed   No ischemia/  Moderate scar in the inferior wall, otherwise normal perfusion/  LV ef 39%,  LV wall motion: inferior/ inferolateral hypokinesis  . COLONOSCOPY  05/06/2007   normal, small int hemorrhoids rpt 5 yrs due to fmhx - rec against rpt colonoscopy by Dr Collene Mares  . CORONARY ANGIOPLASTY  2000  dr Stanford Breed   PCI to RCA and PDA  . CORONARY ANGIOPLASTY WITH STENT PLACEMENT  03-19-2005  dr Gwyndolyn Saxon downey   inferior STEMI--- DES x4 to RCA w/ balloon angioplasty and balloon angioplasty to jailed PDA ostium/  severe hypokinesis of midinferor wall, ef 50%/  dLM 20%,  mLAD 20%,  dCFX 60%  . HYDRADENITIS EXCISION Right 12/31/2014   Procedure: WIDE EXCISION HIDRADENITIS GROIN; Coralie Keens, MD  . LUMBAR DISC SURGERY     L5-S1  . LUMBAR LAMINECTOMY  05-18-2010   L2--5   laminectomy/foraminotomy for stenosis Joya Salm)  . MYELOGRAM     L5-S1 and L1-2 spondylosis  . SACROILIAC JOINT INJECTION Bilateral 10/2013   [place]  . [place]    Social History OSA CAMPOLI  reports that he has been  smoking Cigarettes.  He started smoking about 49 years ago. He has a 57.00 pack-year smoking history. He has never used smokeless tobacco. He reports that he uses drugs, including Fentanyl. He reports that he does not drink alcohol.  family history includes Aneurysm in his brother and mother; Cancer in his brother, brother, father, and mother; Charcot-Marie-Tooth disease in his mother; Coronary artery disease in his brother; Diabetes in his mother; Heart attack in his father; Heart disease in his father and mother; Kidney disease in his mother; Rheum arthritis in his brother, mother, and sister.  Allergies  Allergen Reactions  . Statins Shortness Of Breath    Cough, trouble breathing  . Losartan Other (See Comments)    Causes him to have pain  . Allopurinol Nausea Only  . Baclofen Nausea And Vomiting  . Gabapentin Nausea And Vomiting       PHYSICAL EXAMINATION: Vital signs: Ht 5'  8" (1.727 m)   Wt 290 lb (131.5 kg)   BMI 44.09 kg/m   Constitutional: Obese, chronically ill and unhealthy appearing, no acute distress Psychiatric: Pleasant, alert and oriented x3, cooperative Eyes: extraocular movements intact, anicteric, conjunctiva pink Mouth: oral pharynx moist, no lesions Neck: supple without thyromegaly Lymph: no lymphadenopathy Cardiovascular: heart regular rate and rhythm, no murmur Lungs: clear to auscultation bilaterally Abdomen: soft, obese, nontender, nondistended, no obvious ascites, no peritoneal signs, normal bowel sounds, no organomegaly Rectal: Omitted Extremities: no clubbing or cyanosis. Trace lower extremity edema bilaterally Skin: no lesions on visible extremities Neuro: No focal deficits. Cranial nerves intact. No asterixis.  ASSESSMENT:  #1. Intermittent solid food dysphagia. Rule out esophageal stricture #2. Weight loss secondary to decreased appetite #3. Chronic abdominal pain. May be narcotic bowel #4. Hepatic cirrhosis secondary to NASH. Compensated. MELD  score 10 #5. GERD. Symptoms controlled on PPI #6. MULTIPLE SIGNIFICANT medical problems   PLAN:  #1. Continue PPI #2. Schedule upper endoscopy with possible esophageal dilation. Also screen for varices. The patient is HIGH RISK given his comorbidities and body habitus and chronic narcotic use.The nature of the procedure, as well as the risks, benefits, and alternatives were carefully and thoroughly reviewed with the patient. Ample time for discussion and questions allowed. The patient understood, was satisfied, and agreed to proceed. #3. Exercise and weight loss #4. Ongoing general medical especially care with his other physicians #5. Routine GI follow-up at least annually

## 2016-11-29 ENCOUNTER — Ambulatory Visit: Payer: Medicare PPO | Admitting: Dietician

## 2016-11-29 ENCOUNTER — Other Ambulatory Visit: Payer: Self-pay

## 2016-11-29 NOTE — Telephone Encounter (Signed)
[name] [facility] signed) left v/m requesting rx for fentanyl patch 50 mcg ( last printed # 8 on 11/14/16) and fentanyl patch 25 mcg (last printed # 7 on 11/14/16). Pt last seen 09/19/16 for annual exam.

## 2016-12-01 NOTE — Telephone Encounter (Signed)
Left detailed message.   

## 2016-12-01 NOTE — Telephone Encounter (Signed)
Too early - have them call us in 10 days for refill (last filled 11/14/2016)

## 2016-12-11 ENCOUNTER — Other Ambulatory Visit: Payer: Self-pay

## 2016-12-11 NOTE — Telephone Encounter (Signed)
[name] [facility] signed) left v/m requesting refill fentanyl patches. Fentanyl 50 mcg # 8 on 11/14/16; and fentanyl 25 mcg # 7 on 11/14/16. Pt last seen 09/19/16 for annual exam. Call when ready for pick up.

## 2016-12-12 MED ORDER — FENTANYL 25 MCG/HR TD PT72: 25.0000 ug | MEDICATED_PATCH | TRANSDERMAL | 0 refills | Status: DC

## 2016-12-12 MED ORDER — FENTANYL 50 MCG/HR TD PT72: 50.0000 ug | MEDICATED_PATCH | TRANSDERMAL | 0 refills | Status: DC

## 2016-12-12 NOTE — Telephone Encounter (Signed)
Printed and in [place] box.

## 2016-12-12 NOTE — Telephone Encounter (Signed)
Spoke to pt and informed him Rx is available for pickup from the front desk. Pt advised third party unable to pickup .  

## 2016-12-13 ENCOUNTER — Telehealth: Payer: Self-pay | Admitting: Dietician

## 2016-12-13 NOTE — Telephone Encounter (Signed)
Called patient to reschedule missed appointment from 11/29/16. Spoke with a person who stated he would not be in until later today. Will attempt another contact later.

## 2016-12-18 ENCOUNTER — Other Ambulatory Visit: Payer: Self-pay

## 2016-12-18 MED ORDER — OXYCODONE HCL 15 MG PO TABS: 15.0000 mg | ORAL_TABLET | ORAL | 0 refills | Status: DC | PRN

## 2016-12-18 NOTE — Telephone Encounter (Signed)
Patient notified by telephone that script is up front ready for pickup. 

## 2016-12-18 NOTE — Telephone Encounter (Signed)
Rx printed and in [place] box.

## 2016-12-18 NOTE — Telephone Encounter (Signed)
Pt left v/m requesting rx oxycodone. Call when ready for pick up; pt wants to pick up oxycodone rx when picks up fentanyl rx this afternoon.(fentanyl rx already at front desk for pick up.). Last printed # 180 on 11/14/16. Last annual 09/19/16.

## 2016-12-19 ENCOUNTER — Encounter: Payer: Self-pay | Admitting: Family Medicine

## 2016-12-19 ENCOUNTER — Encounter: Payer: Self-pay | Admitting: Dietician

## 2016-12-19 DIAGNOSIS — Z79891 Long term (current) use of opiate analgesic: Secondary | ICD-10-CM | POA: Diagnosis not present

## 2016-12-19 NOTE — Progress Notes (Signed)
Have not heard back from patient to reschedule missed appointment. Sent discharge letter to MD.

## 2016-12-28 ENCOUNTER — Telehealth: Payer: Self-pay | Admitting: Family Medicine

## 2016-12-28 NOTE — Telephone Encounter (Signed)
Spoke to pt and advised per Dr Darnell Level; pt verbally expressed understanding.

## 2016-12-28 NOTE — Telephone Encounter (Signed)
Please notify patient - this is third positive MJ UDS in a row. As discussed previously, I cannot continue prescribing narcotics if continued MJ use. If we have another positive, I will need to stop prescribing narcotics.

## 2017-01-03 HISTORY — PX: ESOPHAGOGASTRODUODENOSCOPY: SHX1529

## 2017-01-09 ENCOUNTER — Encounter: Payer: Self-pay | Admitting: Internal Medicine

## 2017-01-13 DIAGNOSIS — M1611 Unilateral primary osteoarthritis, right hip: Secondary | ICD-10-CM | POA: Diagnosis not present

## 2017-01-15 ENCOUNTER — Other Ambulatory Visit: Payer: Self-pay | Admitting: *Deleted

## 2017-01-15 ENCOUNTER — Encounter: Payer: Self-pay | Admitting: Internal Medicine

## 2017-01-15 NOTE — Telephone Encounter (Signed)
Patient's wife left a voicemail requesting refills on the following medications Fentanyl patch 50 mg.last refill 12/12/16 #8 Fentanyl patch 25 last refill 12/12/16 #7 Oxycodone last refill 12/18/16 #180 Last office visit 09/19/16 Call when ready for pickup

## 2017-01-16 ENCOUNTER — Ambulatory Visit (AMBULATORY_SURGERY_CENTER): Payer: Medicare PPO | Admitting: Internal Medicine

## 2017-01-16 ENCOUNTER — Encounter: Payer: Self-pay | Admitting: Internal Medicine

## 2017-01-16 VITALS — BP 151/97 | HR 71 | Temp 98.6°F | Resp 13 | Ht 68.0 in | Wt 290.0 lb

## 2017-01-16 DIAGNOSIS — K766 Portal hypertension: Secondary | ICD-10-CM | POA: Diagnosis not present

## 2017-01-16 DIAGNOSIS — K3189 Other diseases of stomach and duodenum: Secondary | ICD-10-CM

## 2017-01-16 DIAGNOSIS — K222 Esophageal obstruction: Secondary | ICD-10-CM | POA: Diagnosis not present

## 2017-01-16 DIAGNOSIS — R1310 Dysphagia, unspecified: Secondary | ICD-10-CM

## 2017-01-16 MED ORDER — SODIUM CHLORIDE 0.9 % IV SOLN: 500.0000 mL | INTRAVENOUS | Status: DC

## 2017-01-16 NOTE — Progress Notes (Signed)
Spontaneous respirations throughout. VSS. Resting comfortably. To PACU on room air. Report to  RN. 

## 2017-01-16 NOTE — Patient Instructions (Signed)
YOU HAD AN ENDOSCOPIC PROCEDURE TODAY AT [facility]:   Refer to the procedure report that was given to you for any specific questions about what was found during the examination.  If the procedure report does not answer your questions, please call your gastroenterologist to clarify.  If you requested that your care partner not be given the details of your procedure findings, then the procedure report has been included in a sealed envelope for you to review at your convenience later.  YOU SHOULD EXPECT: Some feelings of bloating in the abdomen. Passage of more gas than usual.  Walking can help get rid of the air that was put into your GI tract during the procedure and reduce the bloating. If you had a lower endoscopy (such as a colonoscopy or flexible sigmoidoscopy) you may notice spotting of blood in your stool or on the toilet paper. If you underwent a bowel prep for your procedure, you may not have a normal bowel movement for a few days.  Please Note:  You might notice some irritation and congestion in your nose or some drainage.  This is from the oxygen used during your procedure.  There is no need for concern and it should clear up in a day or so.  SYMPTOMS TO REPORT IMMEDIATELY:     Following upper endoscopy (EGD)  Vomiting of blood or coffee ground material  New chest pain or pain under the shoulder blades  Painful or persistently difficult swallowing  New shortness of breath  Fever of 100F or higher  Black, tarry-looking stools  For urgent or emergent issues, a gastroenterologist can be reached at any hour by calling [phone].   DIET:   Follow Dilation Handout.   ACTIVITY:  You should plan to take it easy for the rest of today and you should NOT DRIVE or use heavy machinery until tomorrow (because of the sedation medicines used during the test).    FOLLOW UP: Our staff will call the number listed on your records the next business day following your  procedure to check on you and address any questions or concerns that you may have regarding the information given to you following your procedure. If we do not reach you, we will leave a message.  However, if you are feeling well and you are not experiencing any problems, there is no need to return our call.  We will assume that you have returned to your regular daily activities without incident.  If any biopsies were taken you will be contacted by phone or by letter within the next 1-3 weeks.  Please call us at [phone] if you have not heard about the biopsies in 3 weeks.    SIGNATURES/CONFIDENTIALITY: You and/or your care partner have signed paperwork which will be entered into your electronic medical record.  These signatures attest to the fact that that the information above on your After Visit Summary has been reviewed and is understood.  Full responsibility of the confidentiality of this discharge information lies with you and/or your care-partner.   Information given on Dilation handout,resume medications. Information given on GERD.

## 2017-01-16 NOTE — Progress Notes (Signed)
Called to room to assist during endoscopic procedure.  Patient ID and intended procedure confirmed with present staff. Received instructions for my participation in the procedure from the performing physician.  

## 2017-01-16 NOTE — Op Note (Signed)
[name] Procedure Date: 01/16/2017 7:58 AM MRN: [id number] Endoscopist: Docia Chuck. [name] , MD Age: 61 Referring MD:  Date of Birth: [birth year] Gender: Male Account #: [id number] Procedure:                Upper GI endoscopy with [facility] dilation of the                            esophagus Indications:              Dysphagia Medicines:                Monitored Anesthesia Care Procedure:                Pre-Anesthesia Assessment:                           - Prior to the procedure, a History and Physical                            was performed, and patient medications and                            allergies were reviewed. The patient's tolerance of                            previous anesthesia was also reviewed. The risks                            and benefits of the procedure and the sedation                            options and risks were discussed with the patient.                            All questions were answered, and informed consent                            was obtained. Prior Anticoagulants: The patient has                            taken no previous anticoagulant or antiplatelet                            agents. ASA Grade Assessment: III - A patient with                            severe systemic disease. After reviewing the risks                            and benefits, the patient was deemed in                            satisfactory condition to undergo the procedure.  After obtaining informed consent, the endoscope was                            passed under direct vision. Throughout the                            procedure, the patient's blood pressure, pulse, and                            oxygen saturations were monitored continuously. The                            Endoscope was introduced through the mouth, and                            advanced to the second part of duodenum. The upper                          GI endoscopy was accomplished without difficulty.                            The patient tolerated the procedure well. Scope In: Scope Out: Findings:                 One moderate benign-appearing, intrinsic stenosis                            was found 41 cm from the incisors. This measured                            1.5 cm (inner diameter). The scope was withdrawn.                            Dilation was performed with a Maloney dilator with                            no resistance at 46 Fr. No heme. Tolerated well.                           The exam of the esophagus was otherwise normal. No                            varices.                           Mild portal hypertensive gastropathy was found in                            the entire examined stomach.                           The stomach was otherwise normal.                           The examined duodenum was normal.  The cardia and gastric fundus were normal on                            retroflexion. Complications:            No immediate complications. Estimated Blood Loss:     Estimated blood loss: none. Impression:               - Benign-appearing esophageal stenosis. Dilated.                           - Portal hypertensive gastropathy.                           - Normal stomach.                           - Normal examined duodenum.                           - No specimens collected. Recommendation:           - Patient has a contact number available for                            emergencies. The signs and symptoms of potential                            delayed complications were discussed with the                            patient. Return to normal activities tomorrow.                            Written discharge instructions were provided to the                            patient.                           - Post dilation diet today.                           - Reflux  precautions with attention to weight loss                           - Continue present medications, including                            omeprazole 40 mg daily                           - Resume general medical care with your primary                            provider and other specialists. Recommend routine  GI follow-up in 1 year. Docia Chuck. Miguel Pastor, MD 01/16/2017 8:43:08 AM This report has been signed electronically.

## 2017-01-17 ENCOUNTER — Telehealth: Payer: Self-pay | Admitting: *Deleted

## 2017-01-17 ENCOUNTER — Encounter: Payer: Self-pay | Admitting: Family Medicine

## 2017-01-17 MED ORDER — FENTANYL 25 MCG/HR TD PT72: 25.0000 ug | MEDICATED_PATCH | TRANSDERMAL | 0 refills | Status: DC

## 2017-01-17 MED ORDER — OXYCODONE HCL 15 MG PO TABS: 15.0000 mg | ORAL_TABLET | ORAL | 0 refills | Status: DC | PRN

## 2017-01-17 MED ORDER — FENTANYL 50 MCG/HR TD PT72: 50.0000 ug | MEDICATED_PATCH | TRANSDERMAL | 0 refills | Status: DC

## 2017-01-17 NOTE — Telephone Encounter (Signed)
  Follow up Call-  Call back number 01/16/2017  Post procedure Call Back phone  # [phone] or [phone]  Permission to leave phone message Yes  Some recent data might be hidden     Patient questions:  Do you have a fever, pain , or abdominal swelling? No. Pain Score  0 *  Have you tolerated food without any problems? Yes.    Have you been able to return to your normal activities? Yes.    Do you have any questions about your discharge instructions: Diet   No. Medications  No. Follow up visit  No.  Do you have questions or concerns about your Care? No.  Actions: * If pain score is 4 or above: No action needed, pain <4.

## 2017-01-17 NOTE — Telephone Encounter (Signed)
Message left

## 2017-01-17 NOTE — Telephone Encounter (Signed)
Spoke to pt and informed him Rx is available for pickup from the front desk

## 2017-01-17 NOTE — Telephone Encounter (Signed)
Printed and in [place] box

## 2017-01-18 ENCOUNTER — Telehealth: Payer: Self-pay | Admitting: *Deleted

## 2017-01-18 NOTE — Telephone Encounter (Signed)
PA done for pt's oxycodone 36m at [email or web address].com

## 2017-01-24 ENCOUNTER — Telehealth: Payer: Self-pay

## 2017-01-24 DIAGNOSIS — R319 Hematuria, unspecified: Secondary | ICD-10-CM

## 2017-01-24 NOTE — Telephone Encounter (Addendum)
Pt request referral to [facility]due to blood in urine for 6 months. Pt has spoken to Dr Darnell Level about this problem before and wants to see urologist.pt saw Zara Council PA in 2016 but pt needs referral with records before appt can be scheduled with [facility].pt request cb with appt ASAP. Pt understands Dr Darnell Level is out of office today.Please advise.

## 2017-01-25 NOTE — Telephone Encounter (Signed)
eval was started 08/2014 saw [facility].  Will refer to complete evaluation.  We have not discussed this recently.

## 2017-01-28 ENCOUNTER — Encounter: Payer: Self-pay | Admitting: Family Medicine

## 2017-01-29 ENCOUNTER — Other Ambulatory Visit: Payer: Self-pay | Admitting: Family Medicine

## 2017-01-29 NOTE — Telephone Encounter (Signed)
Received refill electronically Last refill 11/02/16 #20 Last office visit 09/19/16

## 2017-01-31 ENCOUNTER — Telehealth: Payer: Self-pay

## 2017-01-31 NOTE — Telephone Encounter (Signed)
Pt is needing hip replacement but before he can have the replacement done the surgeon wants the boils on scrotum healed. Pt has 3 - 4 boils. Pt has Hidradenitis. Pt request abx. [place]; pt cannot walk so pt cannot come in for appt. Pt said Dr Darnell Level has seen boils before.pt request cb.

## 2017-02-01 MED ORDER — DOXYCYCLINE HYCLATE 100 MG PO TABS: 100.0000 mg | ORAL_TABLET | Freq: Two times a day (BID) | ORAL | 0 refills | Status: DC

## 2017-02-01 MED ORDER — CHLORHEXIDINE GLUCONATE 4 % EX LIQD: Freq: Every day | CUTANEOUS | 0 refills | Status: DC | PRN

## 2017-02-01 NOTE — Telephone Encounter (Signed)
Spoke to pt and advised per Dr Darnell Level

## 2017-02-01 NOTE — Telephone Encounter (Signed)
Will send in abx course. Will need office visit if no better.  rec hibiclens wash daily as well.

## 2017-02-08 ENCOUNTER — Ambulatory Visit (INDEPENDENT_AMBULATORY_CARE_PROVIDER_SITE_OTHER): Payer: Medicare PPO | Admitting: Urology

## 2017-02-08 ENCOUNTER — Encounter: Payer: Self-pay | Admitting: Urology

## 2017-02-08 VITALS — BP 112/65 | HR 73 | Ht 68.0 in | Wt 308.0 lb

## 2017-02-08 DIAGNOSIS — N50811 Right testicular pain: Secondary | ICD-10-CM

## 2017-02-08 DIAGNOSIS — R310 Gross hematuria: Secondary | ICD-10-CM | POA: Diagnosis not present

## 2017-02-08 NOTE — Progress Notes (Signed)
02/08/2017 9:54 AM   [name] Jan 10, 1956 [id number]  Referring provider: Ria Bush, MD [address] [place], [place] [address]  Chief Complaint  Patient presents with  . Hematuria    HPI: The patient is a 61 year old woman with multiple comorbidities including CAD, COPD, morbid obesity, hepatic cirrhosis, and chronic pain on chronic pain meds presents today for evaluation of gross hematuria.  He has noticed intermittently for the last few months she colored to pink lemonade-colored urine. He has had no clots. He has had no pain with urination. He was seen in office a few years ago for microscopic hematuria, but she never followed up for further workup due to other major medical issues at that time. He is a current smoker.  The patient also complains of right testicle pain for the last 3-4 years. Touching it makes it worse. He is on chronic narcotics for chronic pain in his back and neck.  He had a CT angiogram to to evaluate for aortic dissection and Jerry 2018. I personally reviewed these images. There is no source for hematuria identified on these images. He does have small benign left renal cyst. No renal masses.  PMH: Past Medical History:  Diagnosis Date  . AAA (abdominal aortic aneurysm) ([place]) 09/2012--  monitored by dr Trula Slade   stable 5.6cm CTA abdomen 2016  . Abnormal drug screen 07/09/2016   1/2/018 - positive oxycodone, fentanyl, inapprop positive MJ - mod risk  . Allergic rhinitis   . B12 deficiency   . CAD (coronary artery disease) cardiologist-  dr Stanford Breed   x3 with stents last 2005, EF 40%, predominantly RCA by CT 2016  . Cataracts, bilateral   . Cervical spondylosis 05/2010   s/p surgery  . Charcot Marie Tooth muscular atrophy dx  [phone]   neurologist--  dr love--  type 2 per pt  . Chronic pain syndrome    [facility] with [facility] --> disagreement and transfered care to Dr Sanjuan Dame at [facility] pain [facility],  requests PCP write Rx but f/u with pain clinic Q6-12 months  . COPD (chronic obstructive pulmonary disease) ([place]) 10/2011   minimal by PFTs  . DDD (degenerative disc disease)   . Disturbances of sensation of smell and taste    improving  . Dyspnea on exertion   . GERD (gastroesophageal reflux disease)   . Gout   . Headache   . Hepatitis    hepatitis B  . Hidradenitis    right groin  . Hidradenitis suppurativa dx 2011   goin and leg crease   followd by Lyndle Herrlich - daily bactrim, s/p intralesional steroid injection 10/2010  . Hip osteoarthritis    s/p intraarticular steroid shot (12/2012) ([place])  . History of hepatitis B 1983  . History of MI (myocardial infarction)    2000  &  2005  . History of pneumonia   . History of viral meningitis 2000  . HLD (hyperlipidemia)   . HTN (hypertension)   . Ischemic cardiomyopathy    s/p inferior MI  --  current ef per myoview 39%  . Liver cirrhosis secondary to NASH ([place]) 01/2014   by CT scan, rec virtual colonoscopy by Dr Collene Mares 06/2014  . Lumbar herniated disc   . Myocardial infarction ([place])    x2  . Nocturia more than twice per night   . Obesity   . Spinal stenosis    released from [place].  established with [facility] (07/2013)  . T2DM (  type 2 diabetes mellitus) ([place])    ABIs WNL 2016  . Vitamin D deficiency     Surgical History: Past Surgical History:  Procedure Laterality Date  . ABDOMINAL AORTIC ENDOVASCULAR FENESTRATED STENT GRAFT N/A 11/30/2015   Procedure: ABDOMINAL AORTIC ENDOVASCULAR FENESTRATED STENT GRAFT;  Surgeon: Serafina Mitchell, MD;  Location: [facility];  Service: Vascular;  Laterality: N/A;  . ANTERIOR CERVICAL DECOMP/DISCECTOMY FUSION  01-07-2010    C4 -- C7  . CARDIAC CATHETERIZATION  03-30-2005  dr Albertine Patricia   ef 40% w/ inferior akinesis/  LM and CFX angiographically normal/  pLAD 30%/   Widely patent stents in RCA and PDA widely patent  . CARDIOVASCULAR STRESS TEST  10-23-2012  dr Stanford Breed   No ischemia/   Moderate scar in the inferior wall, otherwise normal perfusion/  LV ef 39%,  LV wall motion: inferior/ inferolateral hypokinesis  . COLONOSCOPY  05/06/2007   normal, small int hemorrhoids rpt 5 yrs due to fmhx - rec against rpt colonoscopy by Dr Collene Mares  . CORONARY ANGIOPLASTY  2000  dr Stanford Breed   PCI to RCA and PDA  . CORONARY ANGIOPLASTY WITH STENT PLACEMENT  03-19-2005  dr Gwyndolyn Saxon downey   inferior STEMI--- DES x4 to RCA w/ balloon angioplasty and balloon angioplasty to jailed PDA ostium/  severe hypokinesis of midinferor wall, ef 50%/  dLM 20%,  mLAD 20%,  dCFX 60%  . ESOPHAGOGASTRODUODENOSCOPY  01/2017   dilated esophageal stenosis, portal hypertensive gastropathy Henrene Pastor)  . HYDRADENITIS EXCISION Right 12/31/2014   Procedure: WIDE EXCISION HIDRADENITIS GROIN; Coralie Keens, MD  . LUMBAR DISC SURGERY     L5-S1  . LUMBAR LAMINECTOMY  05-18-2010   L2--5   laminectomy/foraminotomy for stenosis Joya Salm)  . MYELOGRAM     L5-S1 and L1-2 spondylosis  . SACROILIAC JOINT INJECTION Bilateral 10/2013   [place]  . TONSILLECTOMY AND ADENOIDECTOMY  1972    Home Medications:  Allergies as of 02/08/2017      Reactions   Statins Shortness Of Breath   Cough, trouble breathing   Losartan Other (See Comments)   Causes him to have pain   Allopurinol Nausea Only   Baclofen Nausea And Vomiting   Gabapentin Nausea And Vomiting      Medication List       Accurate as of 02/08/17  9:54 AM. Always use your most recent med list.          albuterol (2.5 MG/3ML) 0.083% nebulizer solution Commonly known as:  PROVENTIL USE 1 VIAL PER NEBULIZER EVERY 6 HRS AS NEEDED FOR WHEEZING   albuterol 108 (90 Base) MCG/ACT inhaler Commonly known as:  PROVENTIL HFA;VENTOLIN HFA Inhale 2 puffs into the lungs every 6 (six) hours as needed for wheezing or shortness of breath.   aspirin 325 MG tablet Take 1 tablet (325 mg total) by mouth daily.   chlorhexidine 4 % external liquid Commonly known as:  HIBICLENS Apply  topically daily as needed.   dicyclomine 20 MG tablet Commonly known as:  BENTYL Take 1 tablet (20 mg total) by mouth 3 (three) times daily before meals. As needed for abdominal pain   doxycycline 100 MG tablet Commonly known as:  VIBRA-TABS Take 1 tablet (100 mg total) by mouth 2 (two) times daily.   DULoxetine 60 MG capsule Commonly known as:  CYMBALTA Take 1 capsule (60 mg total) by mouth daily.   fentaNYL 50 MCG/HR Commonly known as:  DURAGESIC - dosed mcg/hr Place 1 patch (50 mcg total) onto the skin every  other day. Alternating with 30mg patch   fentaNYL 25 MCG/HR patch Commonly known as:  DURAGESIC Place 1 patch (25 mcg total) onto the skin every other day. Alternating with 580m patch   fluticasone 50 MCG/ACT nasal spray Commonly known as:  FLONASE Place 2 sprays into both nostrils daily as needed for allergies.   folic acid 1 MG tablet Commonly known as:  FOLVITE TAKE 1 TABLET BY MOUTH DAILY   glucose blood test strip 1 each by Other route as needed for other. Use as instructed to check sugar once daily and as needed. **ONE TOUCH ULTRA** Dx: E11.9   irbesartan 75 MG tablet Commonly known as:  AVAPRO TAKE 1/2 TABLET BY MOUTH ONCE DAILY   IRON SUPPLEMENT PO Take 65 mg by mouth daily.   loperamide 2 MG capsule Commonly known as:  IMODIUM Take 1 capsule (2 mg total) by mouth 4 (four) times daily as needed for diarrhea or loose stools.   metoprolol succinate 25 MG 24 hr tablet Commonly known as:  TOPROL-XL TAKE 1/2 TABLET BY MOUTH ONCE DAILY   nitroGLYCERIN 0.4 MG SL tablet Commonly known as:  NITROSTAT Place 1 tablet (0.4 mg total) under the tongue every 5 (five) minutes as needed for chest pain.   omeprazole 40 MG capsule Commonly known as:  PRILOSEC Take 1 capsule (40 mg total) by mouth daily.   ondansetron 4 MG disintegrating tablet Commonly known as:  ZOFRAN-ODT DISSOLVE ONE OR TWO TABLETS IN THE MOUTHEVERY 8 (EIGHT) HOURS AS NEEDED FOR NAUSEA  AND/OR VOMITING   oxyCODONE 15 MG immediate release tablet Commonly known as:  ROXICODONE Take 1 tablet (15 mg total) by mouth every 4 (four) hours as needed.   polyethylene glycol powder powder Commonly known as:  GLYCOLAX/MIRALAX Take 17 g by mouth daily as needed for moderate constipation.   Red Yeast Rice 600 MG Caps Take 2 capsules by mouth daily.   SUPER B COMPLEX PO Take 1 tablet by mouth daily.   TART CHERRY ADVANCED Caps Take 1 capsule by mouth 2 (two) times daily.   vitamin B-12 500 MCG tablet Commonly known as:  CYANOCOBALAMIN Take 500 mcg by mouth daily.   Vitamin D3 2000 units Tabs Take 2,000 Units by mouth daily.            Discharge Care Instructions        Start     Ordered   02/08/17 0000  Urinalysis, Complete     02/08/17 0910      Allergies:  Allergies  Allergen Reactions  . Statins Shortness Of Breath    Cough, trouble breathing  . Losartan Other (See Comments)    Causes him to have pain  . Allopurinol Nausea Only  . Baclofen Nausea And Vomiting  . Gabapentin Nausea And Vomiting    Family History: Family History  Problem Relation Age of Onset  . Cancer Mother        colon  . Diabetes Mother   . Kidney disease Mother   . Aneurysm Mother        AAA  . Rheum arthritis Mother   . Charcot-Marie-Tooth disease Mother   . Heart disease Mother        before age 10668. Cancer Father        skin  . Heart attack Father   . Heart disease Father        before age 10667. Cancer Brother        skin  .  Coronary artery disease Brother   . Cancer Brother        small cell lung cancer  . Aneurysm Brother        AAA  . Rheum arthritis Sister   . Rheum arthritis Brother   . Prostate cancer Neg Hx   . Bladder Cancer Neg Hx   . Kidney cancer Neg Hx     Social History:  reports that he has been smoking Cigarettes.  He started smoking about 49 years ago. He has a 57.00 pack-year smoking history. He has never used smokeless tobacco. He  reports that he uses drugs, including Fentanyl. He reports that he does not drink alcohol.  ROS: UROLOGY Frequent Urination?: Yes Hard to postpone urination?: Yes Burning/pain with urination?: No Get up at night to urinate?: Yes Leakage of urine?: Yes Urine stream starts and stops?: Yes Trouble starting stream?: Yes Do you have to strain to urinate?: Yes Blood in urine?: No Urinary tract infection?: No Sexually transmitted disease?: No Injury to kidneys or bladder?: No Painful intercourse?: No Weak stream?: No Erection problems?: No Penile pain?: Yes  Gastrointestinal Nausea?: Yes Vomiting?: No Indigestion/heartburn?: No Diarrhea?: No Constipation?: No  Constitutional Fever: No Night sweats?: Yes Weight loss?: No Fatigue?: Yes  Skin Skin rash/lesions?: Yes Itching?: Yes  Eyes Blurred vision?: No Double vision?: No  Ears/Nose/Throat Sore throat?: No Sinus problems?: No  Hematologic/Lymphatic Swollen glands?: Yes Easy bruising?: Yes  Cardiovascular Leg swelling?: No Chest pain?: No  Respiratory Cough?: No Shortness of breath?: No  Endocrine Excessive thirst?: Yes  Musculoskeletal Back pain?: Yes Joint pain?: Yes  Neurological Headaches?: Yes Dizziness?: Yes  Psychologic Depression?: Yes Anxiety?: No  Physical Exam: BP 112/65 (BP Location: Left Arm, Patient Position: Sitting, Cuff Size: Large)   Pulse 73   Ht 5' 8"  (1.727 m)   Wt (!) 308 lb (139.7 kg)   BMI 46.83 kg/m   Constitutional:  Alert and oriented, No acute distress. HEENT: [place] AT, moist mucus membranes.  Trachea midline, no masses. Cardiovascular: No clubbing, cyanosis, or edema. Respiratory: Normal respiratory effort, no increased work of breathing. GI: Abdomen is soft, nontender, nondistended, no abdominal masses GU: No CVA tenderness. Normal phallus. Testicles descended bilaterally. Right testicle is benign and nontender on examination. Left testicle is benign. Skin: No  rashes, bruises or suspicious lesions. Lymph: No cervical or inguinal adenopathy. Neurologic: Grossly intact, no focal deficits, moving all 4 extremities. Psychiatric: Normal mood and affect.  Laboratory Data: Lab Results  Component Value Date   WBC 4.2 10/04/2016   HGB 11.3 (L) 10/04/2016   HCT 33.8 (L) 10/04/2016   MCV 95.3 10/04/2016   PLT 77.0 (L) 10/04/2016    Lab Results  Component Value Date   CREATININE 0.78 10/04/2016    Lab Results  Component Value Date   PSA 0.09 (L) 08/31/2016   PSA 0.19 01/12/2014   PSA 0.27 05/06/2012    No results found for: TESTOSTERONE  Lab Results  Component Value Date   HGBA1C 5.7 08/31/2016    Urinalysis    Component Value Date/Time   COLORURINE AMBER (A) 08/27/2016 0156   APPEARANCEUR HAZY (A) 08/27/2016 0156   LABSPEC 1.016 08/27/2016 0156   PHURINE 5.0 08/27/2016 0156   GLUCOSEU NEGATIVE 08/27/2016 0156   HGBUR MODERATE (A) 08/27/2016 0156   BILIRUBINUR NEGATIVE 08/27/2016 0156   BILIRUBINUR negative 05/19/2014 1109   KETONESUR NEGATIVE 08/27/2016 0156   PROTEINUR 30 (A) 08/27/2016 0156   UROBILINOGEN 0.2 05/19/2014 1109   UROBILINOGEN 1.0  03/01/2008 1803   NITRITE NEGATIVE 08/27/2016 0156   LEUKOCYTESUR LARGE (A) 08/27/2016 0156    Pertinent Imaging: CT reviewed as above  Assessment & Plan:     1. Gross hematuria The patient has recent CT imaging of his abdomen pelvis that was unremarkable. He'll follow-up for cystoscopy. His wife will bring in a urine sample as he was unable to provide one today.  2. Right testicular pain This is likely related to his chronic pain syndrome.  Return for cystoscopy.  Nickie Retort, MD  [facility] [address], [place], [place] [address] [phone]

## 2017-02-08 NOTE — Addendum Note (Signed)
Addended by: Kyra Manges on: 02/08/2017 03:23 PM   Modules accepted: Orders

## 2017-02-13 ENCOUNTER — Other Ambulatory Visit: Payer: Medicare PPO

## 2017-02-13 DIAGNOSIS — R310 Gross hematuria: Secondary | ICD-10-CM | POA: Diagnosis not present

## 2017-02-13 LAB — URINALYSIS, COMPLETE
Bilirubin, UA: NEGATIVE
GLUCOSE, UA: NEGATIVE
Ketones, UA: NEGATIVE
Nitrite, UA: POSITIVE — AB
Specific Gravity, UA: 1.01 (ref 1.005–1.030)
UUROB: 1 mg/dL (ref 0.2–1.0)
pH, UA: 8.5 — ABNORMAL HIGH (ref 5.0–7.5)

## 2017-02-13 LAB — MICROSCOPIC EXAMINATION
Epithelial Cells (non renal): NONE SEEN /hpf (ref 0–10)
WBC UA: NONE SEEN /HPF (ref 0–?)

## 2017-02-14 ENCOUNTER — Other Ambulatory Visit: Payer: Self-pay

## 2017-02-14 NOTE — Telephone Encounter (Signed)
Pt left v/m at 4:51 requesting rx fentanyl 50 mcg (last printed # 8 on 01/17/17) and fentanyl 25 mcg (last printed # 7 on 01/17/17) and oxycodone (last printed # 180 on 01/17/17); pt last seen 09/19/16 for annual.

## 2017-02-15 ENCOUNTER — Telehealth: Payer: Self-pay

## 2017-02-15 NOTE — Telephone Encounter (Signed)
Rx written and in [facility] box.

## 2017-02-15 NOTE — Telephone Encounter (Signed)
Pt left v/m requesting a stand up walker with a seat; pt has had multiple back surgeries; pt has problems bending over. Please send order to [facility]. Pt request cb when done. Last annual 09/19/16.

## 2017-02-15 NOTE — Telephone Encounter (Signed)
Faxed order to [facility] at [phone]. Left message on vm, per dpr, notifying pt order faxed.

## 2017-02-16 DIAGNOSIS — G600 Hereditary motor and sensory neuropathy: Secondary | ICD-10-CM | POA: Diagnosis not present

## 2017-02-16 DIAGNOSIS — R0609 Other forms of dyspnea: Secondary | ICD-10-CM | POA: Diagnosis not present

## 2017-02-16 DIAGNOSIS — M1611 Unilateral primary osteoarthritis, right hip: Secondary | ICD-10-CM | POA: Diagnosis not present

## 2017-02-16 MED ORDER — OXYCODONE HCL 15 MG PO TABS: 15.0000 mg | ORAL_TABLET | ORAL | 0 refills | Status: DC | PRN

## 2017-02-16 MED ORDER — FENTANYL 50 MCG/HR TD PT72: 50.0000 ug | MEDICATED_PATCH | TRANSDERMAL | 0 refills | Status: DC

## 2017-02-16 MED ORDER — FENTANYL 25 MCG/HR TD PT72: 25.0000 ug | MEDICATED_PATCH | TRANSDERMAL | 0 refills | Status: DC

## 2017-02-16 NOTE — Telephone Encounter (Signed)
Rx written and in [facility] box

## 2017-02-16 NOTE — Telephone Encounter (Signed)
Mrs [name] came in today and wants to add on a request for a wheelchair in addition to the walker. She states they would need this for a trip to the beach. Best call back phone number [phone] and [phone].

## 2017-02-16 NOTE — Telephone Encounter (Signed)
Faxed new rx for wheel chair to [facility].

## 2017-02-16 NOTE — Telephone Encounter (Deleted)
Rx was faxed to [facility]. Jeani Hawking received a call askin for his demographics. She sent that in for [place].

## 2017-02-16 NOTE — Telephone Encounter (Signed)
Printed and in [place] box

## 2017-02-16 NOTE — Telephone Encounter (Signed)
Spoke to pt. Rx up front ready for pickup 

## 2017-02-22 ENCOUNTER — Other Ambulatory Visit: Payer: Medicare PPO | Admitting: Urology

## 2017-02-22 ENCOUNTER — Encounter: Payer: Self-pay | Admitting: Urology

## 2017-02-26 ENCOUNTER — Telehealth: Payer: Self-pay | Admitting: Urology

## 2017-02-26 NOTE — Telephone Encounter (Signed)
Pt's wife called asking about urine results from 9/11.  He has appt this Friday for cysto that he missed on 9/20.

## 2017-02-26 NOTE — Telephone Encounter (Signed)
LMOM- urine ok for cysto

## 2017-03-01 ENCOUNTER — Other Ambulatory Visit: Payer: Medicare PPO | Admitting: Urology

## 2017-03-01 ENCOUNTER — Other Ambulatory Visit: Payer: Self-pay | Admitting: Family Medicine

## 2017-03-01 NOTE — Telephone Encounter (Signed)
Last filled:  01/31/17, @20  Last OV (CPE):  09/19/16 Next OV:  none

## 2017-03-01 NOTE — Telephone Encounter (Signed)
[facility]

## 2017-03-02 ENCOUNTER — Ambulatory Visit (INDEPENDENT_AMBULATORY_CARE_PROVIDER_SITE_OTHER): Payer: Medicare PPO | Admitting: Urology

## 2017-03-02 VITALS — BP 108/72 | HR 80 | Ht 68.0 in | Wt 293.0 lb

## 2017-03-02 DIAGNOSIS — R310 Gross hematuria: Secondary | ICD-10-CM

## 2017-03-02 LAB — URINALYSIS, COMPLETE
Bilirubin, UA: NEGATIVE
Nitrite, UA: NEGATIVE
PH UA: 6 (ref 5.0–7.5)
Specific Gravity, UA: 1.02 (ref 1.005–1.030)
Urobilinogen, Ur: >8 mg/dL — ABNORMAL HIGH (ref 0.2–1.0)

## 2017-03-02 LAB — MICROSCOPIC EXAMINATION: RBC, UA: >30 /hpf — ABNORMAL HIGH (ref 0–?)

## 2017-03-02 MED ORDER — CIPROFLOXACIN HCL 500 MG PO TABS
500.0000 mg | ORAL_TABLET | Freq: Once | ORAL | Status: AC
  Administered 2017-03-02: 500 mg via ORAL

## 2017-03-02 MED ORDER — LIDOCAINE HCL 2 % EX GEL
1.0000 "application " | Freq: Once | CUTANEOUS | Status: AC
  Administered 2017-03-02: 1 via URETHRAL

## 2017-03-02 NOTE — Progress Notes (Signed)
   03/02/17  CC:  Chief Complaint  Patient presents with  . Cysto    HPI: The patient is a 61 year old gentleman with multiple comorbidities including CAD, COPD, morbid obesity, hepatic cirrhosis, and chronic pain on chronic pain meds who presents today for further evaluation of gross hematuria.  He has noticed intermittently for the last few months tea colored to pink lemonade-colored urine. He has had no clots. He has had no pain with urination. He was seen in office a few years ago for microscopic hematuria, but he never followed up for further workup due to other major medical issues at that time. He is a current smoker.  He had a CT angiogram to to evaluate for aortic dissection in January 2018. I personally reviewed these images. There is no source for hematuria identified on these images. He does have small benign left renal cyst. No renal masses.  There were no vitals taken for this visit. NED. A&Ox3.   No respiratory distress   Abd soft, NT, ND Normal phallus with bilateral descended testicles  Cystoscopy Procedure Note  Patient identification was confirmed, informed consent was obtained, and patient was prepped using Betadine solution.  Lidocaine jelly was administered per urethral meatus.    Preoperative abx where received prior to procedure.     Pre-Procedure: - Inspection reveals a normal caliber ureteral meatus.  Procedure: The flexible cystoscope was introduced without difficulty - No urethral strictures/lesions are present. - Enlarged prostate Hypervascular prostate. Not very visually obstructive. - Normal bladder neck - Bilateral ureteral orifices identified - Bladder mucosa  reveals no ulcers, tumors, or lesions - No bladder stones - No trabeculation  Retroflexion shows no intravesical lobe.   Post-Procedure: - Patient tolerated the procedure well  Assessment/ Plan:  1. Gross hematuria -Negative workup except for hypervascular prostate. Could  consider finasteride in the future if he develops any form of clot retention. Follow up one year for repeat urinalysis.

## 2017-03-12 ENCOUNTER — Other Ambulatory Visit: Payer: Self-pay | Admitting: Family Medicine

## 2017-03-12 NOTE — Telephone Encounter (Signed)
Has appt tomorrow which was needed for refills.

## 2017-03-13 ENCOUNTER — Encounter: Payer: Self-pay | Admitting: Family Medicine

## 2017-03-13 ENCOUNTER — Telehealth: Payer: Self-pay

## 2017-03-13 ENCOUNTER — Ambulatory Visit (INDEPENDENT_AMBULATORY_CARE_PROVIDER_SITE_OTHER): Payer: Medicare PPO | Admitting: Family Medicine

## 2017-03-13 VITALS — BP 130/70 | HR 78 | Temp 98.0°F | Wt 293.8 lb

## 2017-03-13 DIAGNOSIS — Z0283 Encounter for blood-alcohol and blood-drug test: Secondary | ICD-10-CM | POA: Diagnosis not present

## 2017-03-13 DIAGNOSIS — R296 Repeated falls: Secondary | ICD-10-CM | POA: Insufficient documentation

## 2017-03-13 DIAGNOSIS — G600 Hereditary motor and sensory neuropathy: Secondary | ICD-10-CM | POA: Diagnosis not present

## 2017-03-13 DIAGNOSIS — R310 Gross hematuria: Secondary | ICD-10-CM | POA: Diagnosis not present

## 2017-03-13 DIAGNOSIS — M1611 Unilateral primary osteoarthritis, right hip: Secondary | ICD-10-CM

## 2017-03-13 DIAGNOSIS — E440 Moderate protein-calorie malnutrition: Secondary | ICD-10-CM | POA: Diagnosis not present

## 2017-03-13 DIAGNOSIS — Z6841 Body Mass Index (BMI) 40.0 and over, adult: Secondary | ICD-10-CM | POA: Diagnosis not present

## 2017-03-13 DIAGNOSIS — M5030 Other cervical disc degeneration, unspecified cervical region: Secondary | ICD-10-CM

## 2017-03-13 DIAGNOSIS — G894 Chronic pain syndrome: Secondary | ICD-10-CM | POA: Diagnosis not present

## 2017-03-13 DIAGNOSIS — R892 Abnormal level of other drugs, medicaments and biological substances in specimens from other organs, systems and tissues: Secondary | ICD-10-CM | POA: Diagnosis not present

## 2017-03-13 DIAGNOSIS — G8929 Other chronic pain: Secondary | ICD-10-CM

## 2017-03-13 DIAGNOSIS — K7460 Unspecified cirrhosis of liver: Secondary | ICD-10-CM

## 2017-03-13 LAB — CBC WITH DIFFERENTIAL/PLATELET
BASOS ABS: 0 10*3/uL (ref 0.0–0.1)
Basophils Relative: 0.7 % (ref 0.0–3.0)
EOS ABS: 0.2 10*3/uL (ref 0.0–0.7)
EOS PCT: 3 % (ref 0.0–5.0)
HCT: 39.7 % (ref 39.0–52.0)
Hemoglobin: 12.9 g/dL — ABNORMAL LOW (ref 13.0–17.0)
LYMPHS ABS: 1.5 10*3/uL (ref 0.7–4.0)
LYMPHS PCT: 23.4 % (ref 12.0–46.0)
MCHC: 32.6 g/dL (ref 30.0–36.0)
MCV: 103.3 fl — ABNORMAL HIGH (ref 78.0–100.0)
MONO ABS: 0.4 10*3/uL (ref 0.1–1.0)
MONOS PCT: 6.4 % (ref 3.0–12.0)
NEUTROS PCT: 66.5 % (ref 43.0–77.0)
Neutro Abs: 4.1 10*3/uL (ref 1.4–7.7)
Platelets: 74 10*3/uL — ABNORMAL LOW (ref 150.0–400.0)
RBC: 3.84 Mil/uL — ABNORMAL LOW (ref 4.22–5.81)
RDW: 15.5 % (ref 11.5–15.5)
WBC: 6.2 10*3/uL (ref 4.0–10.5)

## 2017-03-13 LAB — COMPREHENSIVE METABOLIC PANEL
ALT: 12 U/L (ref 0–53)
AST: 21 U/L (ref 0–37)
Albumin: 3 g/dL — ABNORMAL LOW (ref 3.5–5.2)
Alkaline Phosphatase: 123 U/L — ABNORMAL HIGH (ref 39–117)
BILIRUBIN TOTAL: 1.1 mg/dL (ref 0.2–1.2)
BUN: 14 mg/dL (ref 6–23)
CO2: 28 mEq/L (ref 19–32)
Calcium: 9.3 mg/dL (ref 8.4–10.5)
Chloride: 104 mEq/L (ref 96–112)
Creatinine, Ser: 0.82 mg/dL (ref 0.40–1.50)
GFR: 101.18 mL/min (ref >60.00)
GLUCOSE: 92 mg/dL (ref 70–99)
Potassium: 4.4 mEq/L (ref 3.5–5.1)
Sodium: 138 mEq/L (ref 135–145)
TOTAL PROTEIN: 7.4 g/dL (ref 6.0–8.3)

## 2017-03-13 LAB — PROTIME-INR
INR: 1.3 ratio — AB (ref 0.8–1.0)
PROTHROMBIN TIME: 13.6 s — AB (ref 9.6–13.1)

## 2017-03-13 LAB — TSH: TSH: 2.12 u[IU]/mL (ref 0.35–4.50)

## 2017-03-13 MED ORDER — OXYCODONE HCL 15 MG PO TABS: 15.0000 mg | ORAL_TABLET | ORAL | 0 refills | Status: DC | PRN

## 2017-03-13 MED ORDER — FENTANYL 50 MCG/HR TD PT72: 50.0000 ug | MEDICATED_PATCH | TRANSDERMAL | 0 refills | Status: DC

## 2017-03-13 MED ORDER — FENTANYL 25 MCG/HR TD PT72: 25.0000 ug | MEDICATED_PATCH | TRANSDERMAL | 0 refills | Status: DC

## 2017-03-13 MED ORDER — NORTRIPTYLINE HCL 25 MG PO CAPS: 25.0000 mg | ORAL_CAPSULE | Freq: Every day | ORAL | 3 refills | Status: DC

## 2017-03-13 MED ORDER — METOPROLOL SUCCINATE ER 25 MG PO TB24: 12.5000 mg | ORAL_TABLET | Freq: Every day | ORAL | 1 refills | Status: DC

## 2017-03-13 MED ORDER — OXYCODONE HCL 10 MG PO TABS: 10.0000 mg | ORAL_TABLET | Freq: Two times a day (BID) | ORAL | 0 refills | Status: DC | PRN

## 2017-03-13 MED ORDER — ONDANSETRON 4 MG PO TBDP: ORAL_TABLET | ORAL | 0 refills | Status: DC

## 2017-03-13 NOTE — Telephone Encounter (Signed)
Patient was unable to urinate, will come back tomorrow morning 03/14/17 for UDS and pick up rx.

## 2017-03-13 NOTE — Patient Instructions (Addendum)
Try nortriptyline 18m at bedtime for sleep, mood, pain.  For breakthrough pain after falls we can do oxycodone 138mtwice daily for the next week.  Schedule sooner follow up with Dr DoSanjuan [facility] today.  UDS today.

## 2017-03-13 NOTE — Progress Notes (Addendum)
BP 130/70 (BP Location: Left Arm, Patient Position: Sitting, Cuff Size: Large)   Pulse 78   Temp 98 F (36.7 C) (Oral)   Wt 293 lb 12 oz (133.2 kg)   SpO2 95%   BMI 44.66 kg/m    CC: discuss wheelchair Subjective:    Patient ID: YISRAEL OBRYAN, male    DOB: 1955-12-26, 61 y.o.   MRN: [id number]  HPI: SANTONIO SPEAKMAN is a 61 y.o. male presenting on 03/13/2017 for Referral (Here for Face to Face for wheelchair referral)   I last saw patient 09/2016 for CPE.   Recent Rx for rolling walker with seat as well as wheelchair - insurance required face to face visit. This is significantly helping him with his mobility issues affecting ADLs.   R handed.  3 falls in the past week. Feels weaker, legs giving out. Tiring out much more easily. Noticing increasing tremor. No syncope or dizziness prior to fall. Increased leg pain and left arm pain since fall.   Requests extra pain medication today. Has needed to take extra pain medication due to increased pain after falls. Taking up to 7-8 oxycodone 74m per day, although he only has been prescribed #6/day.   Discussing hip replacement with ortho ([facility]). Has appt tomorrow with Dr IRon Ageefor further evaluation of R hip pain.   Wife thinks he remains depressed, pt denies this.  Initially saw Dr LErling Cruzneurologist. Then saw Dr PLeta Baptist but [place].   Recent workup for longstanding gross hematuria - negative urological workup except for hypervascular prostate - Dr BPilar Jarvis(uro) suggested considering finasteride in the future if clot retention develops.   Relevant past medical, surgical, family and social history reviewed and updated as indicated. Interim medical history since our last visit reviewed. Allergies and medications reviewed and updated. Outpatient Medications Prior to Visit  Medication Sig Dispense Refill  . albuterol (PROVENTIL HFA;VENTOLIN HFA) 108 (90 Base) MCG/ACT  inhaler Inhale 2 puffs into the lungs every 6 (six) hours as needed for wheezing or shortness of breath. 1 Inhaler 0  . albuterol (PROVENTIL) (2.5 MG/3ML) 0.083% nebulizer solution USE 1 VIAL PER NEBULIZER EVERY 6 HRS AS NEEDED FOR WHEEZING 75 mL 6  . aspirin 325 MG tablet Take 1 tablet (325 mg total) by mouth daily. 90 tablet 3  . B Complex-C (SUPER B COMPLEX PO) Take 1 tablet by mouth daily.    . chlorhexidine (HIBICLENS) 4 % external liquid Apply topically daily as needed. 473 mL 0  . cholecalciferol 2000 units TABS Take 2,000 Units by mouth daily.    .Marland Kitchendicyclomine (BENTYL) 20 MG tablet Take 1 tablet (20 mg total) by mouth 3 (three) times daily before meals. As needed for abdominal pain 20 tablet 0  . DULoxetine (CYMBALTA) 60 MG capsule Take 1 capsule (60 mg total) by mouth daily. 30 capsule 6  . Ferrous Sulfate (IRON SUPPLEMENT PO) Take 65 mg by mouth daily.    . fluticasone (FLONASE) 50 MCG/ACT nasal spray Place 2 sprays into both nostrils daily as needed for allergies.     . folic acid (FOLVITE) 1 MG tablet Take 1 tablet (1 mg total) by mouth daily. 30 tablet 6  . glucose blood test strip 1 each by Other route as needed for other. Use as instructed to check sugar once daily and as needed. **ONE TOUCH ULTRA** Dx: E11.9 100 each 3  . irbesartan (AVAPRO) 75 MG tablet TAKE 1/2 TABLET BY  MOUTH ONCE DAILY 45 tablet 3  . loperamide (IMODIUM) 2 MG capsule Take 1 capsule (2 mg total) by mouth 4 (four) times daily as needed for diarrhea or loose stools. 12 capsule 0  . Misc Natural Products (TART CHERRY ADVANCED) CAPS Take 1 capsule by mouth 2 (two) times daily.    . nitroGLYCERIN (NITROSTAT) 0.4 MG SL tablet Place 1 tablet (0.4 mg total) under the tongue every 5 (five) minutes as needed for chest pain. 25 tablet 12  . omeprazole (PRILOSEC) 40 MG capsule Take 1 capsule (40 mg total) by mouth daily. 30 capsule 6  . polyethylene glycol powder (GLYCOLAX/MIRALAX) powder Take 17 g by mouth daily as needed  for moderate constipation. 3350 g 0  . Red Yeast Rice 600 MG CAPS Take 2 capsules by mouth daily.    . vitamin B-12 (CYANOCOBALAMIN) 500 MCG tablet Take 500 mcg by mouth daily.    [name] doxycycline (VIBRA-TABS) 100 MG tablet Take 1 tablet (100 mg total) by mouth 2 (two) times daily. 14 tablet 0  . fentaNYL (DURAGESIC - DOSED MCG/HR) 50 MCG/HR Place 1 patch (50 mcg total) onto the skin every other day. Alternating with 25mg patch 8 patch 0  . fentaNYL (DURAGESIC) 25 MCG/HR patch Place 1 patch (25 mcg total) onto the skin every other day. Alternating with 575m patch 7 patch 0  . metoprolol succinate (TOPROL-XL) 25 MG 24 hr tablet TAKE 1/2 TABLET BY MOUTH ONCE DAILY 45 tablet 1  . ondansetron (ZOFRAN-ODT) 4 MG disintegrating tablet DISSOLVE 1 OR 2 TABLETS IN THE MOUTH EVERY 8 (EIGHT) HOURS AS NEEDED FOR NAUSEA AND/OR VOMITING 20 tablet 0  . oxyCODONE (ROXICODONE) 15 MG immediate release tablet Take 1 tablet (15 mg total) by mouth every 4 (four) hours as needed. 180 tablet 0   Facility-Administered Medications Prior to Visit  Medication Dose Route Frequency Provider Last Rate Last Dose  . 0.9 %  sodium chloride infusion  500 mL Intravenous Continuous PeIrene ShipperMD         Per HPI unless specifically indicated in ROS section below Review of Systems     Objective:    BP 130/70 (BP Location: Left Arm, Patient Position: Sitting, Cuff Size: Large)   Pulse 78   Temp 98 F (36.7 C) (Oral)   Wt 293 lb 12 oz (133.2 kg)   SpO2 95%   BMI 44.66 kg/m   Wt Readings from Last 3 Encounters:  03/13/17 293 lb 12 oz (133.2 kg)  03/02/17 293 lb (132.9 kg)  02/08/17 (!) 308 lb (139.7 kg)    Physical Exam  Constitutional: He appears well-developed and well-nourished. No distress.  Slowed antalgic gait with rollator  HENT:  Mouth/Throat: Oropharynx is clear and moist. No oropharyngeal exudate.  Eyes: Pupils are equal, round, and reactive to light. Conjunctivae and EOM are normal. No scleral icterus.    Neck: Normal range of motion. Neck supple. No thyromegaly present.  Cardiovascular: Normal rate, regular rhythm, normal heart sounds and intact distal pulses.   No murmur heard. Pulmonary/Chest: Effort normal and breath sounds normal. No respiratory distress. He has no wheezes. He has no rales.  Musculoskeletal: He exhibits no edema.  Lymphadenopathy:    He has no cervical adenopathy.  Skin: Skin is warm and dry. No rash noted.  Psychiatric: He has a normal mood and affect.  Nursing note and vitals reviewed.  Results for orders placed or performed in visit on 03/13/17  Comprehensive metabolic panel  Result Value Ref  Range   Sodium 138 135 - 145 mEq/L   Potassium 4.4 3.5 - 5.1 mEq/L   Chloride 104 96 - 112 mEq/L   CO2 28 19 - 32 mEq/L   Glucose, Bld 92 70 - 99 mg/dL   BUN 14 6 - 23 mg/dL   Creatinine, Ser 0.82 0.40 - 1.50 mg/dL   Total Bilirubin 1.1 0.2 - 1.2 mg/dL   Alkaline Phosphatase 123 (H) 39 - 117 U/L   AST 21 0 - 37 U/L   ALT 12 0 - 53 U/L   Total Protein 7.4 6.0 - 8.3 g/dL   Albumin 3.0 (L) 3.5 - 5.2 g/dL   Calcium 9.3 8.4 - 10.5 mg/dL   GFR 101.18 >60.00 mL/min  TSH  Result Value Ref Range   TSH 2.12 0.35 - 4.50 uIU/mL  CBC with Differential/Platelet  Result Value Ref Range   WBC 6.2 4.0 - 10.5 K/uL   RBC 3.84 (L) 4.22 - 5.81 Mil/uL   Hemoglobin 12.9 (L) 13.0 - 17.0 g/dL   HCT 39.7 39.0 - 52.0 %   MCV 103.3 (H) 78.0 - 100.0 fl   MCHC 32.6 30.0 - 36.0 g/dL   RDW 15.5 11.5 - 15.5 %   Platelets 74.0 (L) 150.0 - 400.0 K/uL   Neutrophils Relative % 66.5 43.0 - 77.0 %   Lymphocytes Relative 23.4 12.0 - 46.0 %   Monocytes Relative 6.4 3.0 - 12.0 %   Eosinophils Relative 3.0 0.0 - 5.0 %   Basophils Relative 0.7 0.0 - 3.0 %   Neutro Abs 4.1 1.4 - 7.7 K/uL   Lymphs Abs 1.5 0.7 - 4.0 K/uL   Monocytes Absolute 0.4 0.1 - 1.0 K/uL   Eosinophils Absolute 0.2 0.0 - 0.7 K/uL   Basophils Absolute 0.0 0.0 - 0.1 K/uL  Protime-INR  Result Value Ref Range   INR 1.3 (H) 0.8  - 1.0 ratio   Prothrombin Time 13.6 (H) 9.6 - 13.1 sec  Pain Mgmt, Profile 8 w/Conf, U  Result Value Ref Range   Creatinine 120.0 > or = 20. mg/dL   pH 6.59 4.5 - 9.0   Oxidant NEGATIVE <200 mcg/mL   Amphetamines NEGATIVE <500 ng/mL   medMATCH Amphetamines CONSISTENT    Benzodiazepines POSITIVE (A) <100 ng/mL   Alphahydroxyalprazolam 93 (H) <25 ng/mL   medMATCH aOH alprazolam INCONSISTENT    Alphahydroxymidazolam NEGATIVE <50 ng/mL   medMATCH aOH midazolam CONSISTENT    Alphahydroxytriazolam NEGATIVE <50 ng/mL   medMATCH aOH triazolam CONSISTENT    Aminoclonazepam NEGATIVE <25 ng/mL   medMATCH Aminoclonazepam CONSISTENT    Hydroxyethylflurazepam NEGATIVE <50 ng/mL   medMATCH OH,Et flurazepam CONSISTENT    Lorazepam NEGATIVE <50 ng/mL   medMATCH Lorazepam CONSISTENT    Nordiazepam NEGATIVE <50 ng/mL   medMATCH Nordiazepam CONSISTENT    Oxazepam NEGATIVE <50 ng/mL   medMATCH Oxazepam CONSISTENT    Temazepam NEGATIVE <50 ng/mL   medMATCH Temazepam CONSISTENT    Marijuana Metabolite POSITIVE (A) <20 ng/mL   Marijuana Metabolite 21 (H) <5 ng/mL   medMATCH Marijuana Metab INCONSISTENT    Cocaine Metabolite NEGATIVE <150 ng/mL   medMATCH Cocaine Metab CONSISTENT    Opiates NEGATIVE <100 ng/mL   medMATCH Opiates CONSISTENT    Oxycodone POSITIVE (A) <100 ng/mL   Noroxycodone 967 (H) <50 ng/mL   medMATCH Noroxycodone INCONSISTENT    Oxycodone 375 (H) <50 ng/mL   medMATCH Oxycodone INCONSISTENT    Oxymorphone 317 (H) <50 ng/mL   medMATCH Oxymorphone INCONSISTENT    Buprenorphine,  Urine NEGATIVE <5 ng/mL   medMATCH Buprenorphine CONSISTENT    MDMA NEGATIVE <500 ng/mL   [facility] MDMA CONSISTENT    Alcohol Metabolites NEGATIVE <500 ng/mL   medMATCH Alcohol Metab CONSISTENT    6 Acetylmorphine NEGATIVE <10 ng/mL   medMATCH 6 Acetylmorphine CONSISTENT    Lab Results  Component Value Date   CREATININE 0.82 03/13/2017      Assessment & Plan:   Problem List Items Addressed This  Visit    Abnormal drug screen    06/2016 - positive oxycodone, fentanyl, inapprop positive MJ  09/2016 - positive oxycodone, fentanyl, inapprop positive MJ  12/2016 - positive oxycodone, fentanyl, inapprop positive MJ  03/2017 - positive oxycodone, fentanyl, inappropriately positive MJ and xanax  We have previously had discussion abut his marijuana use and I have repeatedly stated I would not continue prescribing narcotics if persistently positive MJ.       BMI 45.0-49.9, adult ([place])    Saw nutritionist 11/2016, did not return for f/u.       Relevant Orders   Ambulatory referral to Home Health   Charcot-Marie-Tooth disease   Relevant Medications   nortriptyline (PAMELOR) 25 MG capsule   Other Relevant Orders   Ambulatory referral to [place]   Chronic pain syndrome    Last visit pt requested #240 oxycodone 73m per month, I discussed I did not feel comfortable prescribing such high amt narcotics.  I recommended trial TCA for his chronic back pain - will start nortriptyline 293mnightly.       Relevant Orders   Ambulatory referral to Home Health   Cirrhosis of liver without ascites ([facility]   Update labs. He has established with [facility]. EGD 11/2016 with benign esophageal stenosis that was dilated as well as portal hypertensive gastropathy.       Relevant Orders   Comprehensive metabolic panel (Completed)   TSH (Completed)   CBC with Differential/Platelet (Completed)   Protime-INR (Completed)   Ambulatory referral to [facility] (degenerative disc disease), cervical   Relevant Medications   fentaNYL (DURAGESIC) 25 MCG/HR patch   Other Relevant Orders   [facility]ulatory referral to [facility] Encounter for chronic pain management - Primary    Current narcotic regimen (oxycodone 1567m4 hours #180/mo and fentanyl 32m4m0mcg alternating patches #15/mo) recommended by pain management clinic Dr DogrSanjuan DameRaleReedsvilleen Q6 months. Had been stable on current regimen however recent  falls have led to increased usage of narcotic and running out early. I will prescribe 1 wk course of oxycodone 10mg35m #14 to last him until next refill due 03/19/2017. I will forward today's note attn Dr DograSanjuan Damewife states she will call today to notify his office of temporary increase in oxycodone regimen.  [place] CSRS reviewed today, appropriate. Update UDS today.   ADDENDUM ==> Patient waited 2 hours and was unable to provide sample for UDS so he left and provided sample the next morning.       Gross hematuria    Completed reassuring urological evaluation (BudzPilar Jarvish hypervascular prostate - consider finasteride in the future.      Osteoarthritis of right hip    Worse after recent fall.       Relevant Medications   fentaNYL (DURAGESIC) 25 MCG/HR patch   Other Relevant Orders   [facility].       Relevant Orders   Ambulatory referral  to Home Health   Recurrent falls    Endorses increased pain after recent falls landing on L arm and R hip. Will increase pain regimen temporarily as per below. He has f/u pending with ortho. Will route note attn Dr Sanjuan Dame.       Relevant Orders   Ambulatory referral to [place]    Other Visit Diagnoses    Encounter for drug screening       Relevant Orders   Pain Mgmt, Profile 8 w/Conf, U (Completed)       Follow up plan: Return if symptoms worsen or fail to improve.  Ria Bush, MD    ADDENDUM 04/19/2017 ==> I touched base with patient about recurrent abnormal UDS and how I will not continue prescribing narcotics because of this. I have tried to reach Dr Sanjuan Dame at [facility] pain clinic several times over the last few weeks unsuccessfully to discuss treatment plan. I recommend pt fully establish with pain clinic for narcotic management from here on. Will forward today's note to Dr Sanjuan Dame. I will prescribe final month of narcotic to patient while he fully  establishes with pain clinic (oxycodone 61m #180 and fentanyl 577mQ2d #15 (6wk increased dose)). [facility] which I have ordered.

## 2017-03-13 NOTE — Assessment & Plan Note (Addendum)
Endorses increased pain after recent falls landing on L arm and R hip. Will increase pain regimen temporarily as per below. He has f/u pending with ortho. Will route note attn Dr Sanjuan Dame.

## 2017-03-13 NOTE — Telephone Encounter (Signed)
Rx sent to pharmacy   

## 2017-03-14 ENCOUNTER — Encounter: Payer: Self-pay | Admitting: Family Medicine

## 2017-03-14 DIAGNOSIS — Z0283 Encounter for blood-alcohol and blood-drug test: Secondary | ICD-10-CM | POA: Diagnosis not present

## 2017-03-14 DIAGNOSIS — M25551 Pain in right hip: Secondary | ICD-10-CM | POA: Diagnosis not present

## 2017-03-14 DIAGNOSIS — G8929 Other chronic pain: Secondary | ICD-10-CM | POA: Diagnosis not present

## 2017-03-14 DIAGNOSIS — M1611 Unilateral primary osteoarthritis, right hip: Secondary | ICD-10-CM | POA: Diagnosis not present

## 2017-03-14 DIAGNOSIS — G894 Chronic pain syndrome: Secondary | ICD-10-CM | POA: Diagnosis not present

## 2017-03-19 ENCOUNTER — Encounter: Payer: Self-pay | Admitting: Family Medicine

## 2017-03-19 LAB — PAIN MGMT, PROFILE 8 W/CONF, U
6 ACETYLMORPHINE: NEGATIVE ng/mL (ref <10)
ALCOHOL METABOLITES: NEGATIVE ng/mL (ref <500)
ALPHAHYDROXYMIDAZOLAM: NEGATIVE ng/mL (ref <50)
ALPHAHYDROXYTRIAZOLAM: NEGATIVE ng/mL (ref <50)
Alphahydroxyalprazolam: 93 ng/mL — ABNORMAL HIGH (ref <25)
Aminoclonazepam: NEGATIVE ng/mL (ref <25)
Amphetamines: NEGATIVE ng/mL (ref <500)
Benzodiazepines: POSITIVE ng/mL — AB (ref <100)
Buprenorphine, Urine: NEGATIVE ng/mL (ref <5)
COCAINE METABOLITE: NEGATIVE ng/mL (ref <150)
CREATININE: 120 mg/dL
HYDROXYETHYLFLURAZEPAM: NEGATIVE ng/mL (ref <50)
LORAZEPAM: NEGATIVE ng/mL (ref <50)
MARIJUANA METABOLITE: POSITIVE ng/mL — AB (ref <20)
MDMA: NEGATIVE ng/mL (ref <500)
Marijuana Metabolite: 21 ng/mL — ABNORMAL HIGH (ref <5)
NOROXYCODONE: 967 ng/mL — AB (ref <50)
Nordiazepam: NEGATIVE ng/mL (ref <50)
OPIATES: NEGATIVE ng/mL (ref <100)
OXYCODONE: 375 ng/mL — AB (ref <50)
OXYMORPHONE: 317 ng/mL — AB (ref <50)
Oxazepam: NEGATIVE ng/mL (ref <50)
Oxidant: NEGATIVE ug/mL (ref <200)
Oxycodone: POSITIVE ng/mL — AB (ref <100)
TEMAZEPAM: NEGATIVE ng/mL (ref <50)
pH: 6.59 (ref 4.5–9.0)

## 2017-03-20 ENCOUNTER — Encounter: Payer: Self-pay | Admitting: Family Medicine

## 2017-03-20 NOTE — Assessment & Plan Note (Signed)
Update labs. He has established with [facility]. EGD 11/2016 with benign esophageal stenosis that was dilated as well as portal hypertensive gastropathy.

## 2017-03-20 NOTE — Assessment & Plan Note (Addendum)
Last visit pt requested #240 oxycodone 66m per month, I discussed I did not feel comfortable prescribing such high amt narcotics.  I recommended trial TCA for his chronic back pain - will start nortriptyline 247mnightly.

## 2017-03-20 NOTE — Assessment & Plan Note (Signed)
Saw nutritionist 11/2016, did not return for f/u.

## 2017-03-20 NOTE — Assessment & Plan Note (Addendum)
Current narcotic regimen (oxycodone 29m Q4 hours #180/mo and fentanyl 247m/50mcg alternating patches #15/mo) recommended by pain management clinic Dr DoSanjuan Damen [place]en Q6 months. Had been stable on current regimen however recent falls have led to increased usage of narcotic and running out early. I will prescribe 1 wk course of oxycodone 1057mID #14 to last him until next refill due 03/19/2017. I will forward today's note attn Dr DogSanjuan Damed wife states she will call today to notify his office of temporary increase in oxycodone regimen.  [place] CSRS reviewed today, appropriate. Update UDS today.   ADDENDUM ==> Patient waited 2 hours and was unable to provide sample for UDS so he left and provided sample the next morning.

## 2017-03-20 NOTE — Assessment & Plan Note (Signed)
Worse after recent fall.

## 2017-03-20 NOTE — Assessment & Plan Note (Signed)
Completed reassuring urological evaluation [name]) with hypervascular prostate - consider finasteride in the future.

## 2017-03-20 NOTE — Assessment & Plan Note (Signed)
06/2016 - positive oxycodone, fentanyl, inapprop positive MJ  09/2016 - positive oxycodone, fentanyl, inapprop positive MJ  12/2016 - positive oxycodone, fentanyl, inapprop positive MJ  03/2017 - positive oxycodone, fentanyl, inappropriately positive MJ and xanax  We have previously had discussion abut his marijuana use and I have repeatedly stated I would not continue prescribing narcotics if persistently positive MJ.

## 2017-03-20 NOTE — Assessment & Plan Note (Signed)
Update labs.  

## 2017-03-26 DIAGNOSIS — F17200 Nicotine dependence, unspecified, uncomplicated: Secondary | ICD-10-CM | POA: Diagnosis not present

## 2017-03-26 DIAGNOSIS — Z716 Tobacco abuse counseling: Secondary | ICD-10-CM | POA: Diagnosis not present

## 2017-03-26 DIAGNOSIS — G894 Chronic pain syndrome: Secondary | ICD-10-CM | POA: Diagnosis not present

## 2017-03-26 DIAGNOSIS — M545 Low back pain: Secondary | ICD-10-CM | POA: Diagnosis not present

## 2017-03-26 DIAGNOSIS — Z79891 Long term (current) use of opiate analgesic: Secondary | ICD-10-CM | POA: Diagnosis not present

## 2017-03-26 DIAGNOSIS — Z720 Tobacco use: Secondary | ICD-10-CM | POA: Diagnosis not present

## 2017-03-26 DIAGNOSIS — M542 Cervicalgia: Secondary | ICD-10-CM | POA: Diagnosis not present

## 2017-03-26 DIAGNOSIS — Z87891 Personal history of nicotine dependence: Secondary | ICD-10-CM | POA: Diagnosis not present

## 2017-04-18 ENCOUNTER — Telehealth: Payer: Self-pay | Admitting: Family Medicine

## 2017-04-18 NOTE — Telephone Encounter (Signed)
Been trying to reach Dr Sanjuan Dame to discuss recent abnormal UDS and plan for pt pain management. Left message Thurs 11/1, tried again today and office mailbox is full. Will try again tomorrow.  [phone]

## 2017-04-18 NOTE — Telephone Encounter (Signed)
Patient's wife is calling to get patient's fentanyl patch using( 50 mcg only) and oxycodone (both dosages) refilled. Patient is using for his chronic pain.

## 2017-04-19 ENCOUNTER — Other Ambulatory Visit: Payer: Self-pay | Admitting: Family Medicine

## 2017-04-19 MED ORDER — FENTANYL 50 MCG/HR TD PT72: 50.0000 ug | MEDICATED_PATCH | TRANSDERMAL | 0 refills | Status: DC

## 2017-04-19 MED ORDER — OXYCODONE HCL 15 MG PO TABS: 15.0000 mg | ORAL_TABLET | ORAL | 0 refills | Status: DC | PRN

## 2017-04-19 NOTE — Telephone Encounter (Signed)
I have tried multiple times to reach Dr Jaci Lazier office - left voice mail early November and haven't been able to reach voicemail or get through to office since then.  I will prescribe 1 more month of narcotic for patient but starting December I recommend he fully establish with Dr Sanjuan Dame for ongoing chronic narcotic management. I will stop prescribing narcotics due to recurrent abnormal UDS.  Rx printed and in Lisa's box.

## 2017-04-19 NOTE — Telephone Encounter (Signed)
Copied from [place] [phone]. Topic: Quick Communication - See Telephone Encounter >> Apr 19, 2017  8:46 AM Arletha Grippe wrote: CRM for notification. See Telephone encounter for:   04/19/17. Pt needs to have fentaNYL (DURAGESIC - DOSED MCG/HR) 50 MCG/HR filled and oxycodone refilled. Pt will be out today.  PLease call wife at [phone] when ready for pickup.Wife would like to have ready by noon, especially if urine sample is required as she has to help the patient come in.

## 2017-04-19 NOTE — Addendum Note (Signed)
Addended by: Ria Bush on: 04/19/2017 02:08 PM   Modules accepted: Orders

## 2017-04-19 NOTE — Telephone Encounter (Signed)
Left message on vm per dpr notifying pt that Dr. Darnell Level is working on the rxs and we will call as soon as he is done.

## 2017-04-19 NOTE — Telephone Encounter (Signed)
Spoke with pt's wife, [name] (on dpr), notifying her rxs are ready to pick up. Also, relayed both messages per Dr. Darnell Level. She say ok and will let pt know. [Placed rxs at front office.]

## 2017-04-19 NOTE — Telephone Encounter (Signed)
I have [facility] per Dr Jaci Lazier written recs  They should receive a call to schedule this soon.

## 2017-04-19 NOTE — Telephone Encounter (Addendum)
Tried to call again today - straight to voicemail and mailbox is full. Unable to get a hold of Dr Sanjuan Dame to touch base. Will continue trying. See other phone note.  plz send latest office note attn Dr Sanjuan Dame fax: [phone]

## 2017-04-19 NOTE — Telephone Encounter (Signed)
Noted  

## 2017-04-19 NOTE — Telephone Encounter (Signed)
Contacted pt via wife, [name], about phone encounter.  Faxed office note from 03/13/17 OV to Dr. Sanjuan Dame per Dr. Darnell Level.

## 2017-04-19 NOTE — Telephone Encounter (Signed)
See request for Fentanyl and Oxycodone

## 2017-04-23 DIAGNOSIS — M5032 Other cervical disc degeneration, mid-cervical region, unspecified level: Secondary | ICD-10-CM | POA: Diagnosis not present

## 2017-04-23 DIAGNOSIS — I2510 Atherosclerotic heart disease of native coronary artery without angina pectoris: Secondary | ICD-10-CM | POA: Diagnosis not present

## 2017-04-23 DIAGNOSIS — K7460 Unspecified cirrhosis of liver: Secondary | ICD-10-CM | POA: Diagnosis not present

## 2017-04-23 DIAGNOSIS — R296 Repeated falls: Secondary | ICD-10-CM | POA: Diagnosis not present

## 2017-04-23 DIAGNOSIS — G894 Chronic pain syndrome: Secondary | ICD-10-CM | POA: Diagnosis not present

## 2017-04-23 DIAGNOSIS — M1611 Unilateral primary osteoarthritis, right hip: Secondary | ICD-10-CM | POA: Diagnosis not present

## 2017-04-23 DIAGNOSIS — E440 Moderate protein-calorie malnutrition: Secondary | ICD-10-CM | POA: Diagnosis not present

## 2017-04-23 DIAGNOSIS — G600 Hereditary motor and sensory neuropathy: Secondary | ICD-10-CM | POA: Diagnosis not present

## 2017-04-23 DIAGNOSIS — J449 Chronic obstructive pulmonary disease, unspecified: Secondary | ICD-10-CM | POA: Diagnosis not present

## 2017-04-25 ENCOUNTER — Telehealth: Payer: Self-pay | Admitting: Family Medicine

## 2017-04-25 NOTE — Telephone Encounter (Signed)
Copied from [place] [phone]. Topic: Quick Communication - See Telephone Encounter >> Apr 25, 2017  1:30 PM Arletha Grippe wrote: CRM for notification. See Telephone encounter for:   04/25/17.Esther from [facility] called requesting verbal order for OT 2 week 3 Cb number is Sherlynn Stalls [phone]

## 2017-04-25 NOTE — Telephone Encounter (Signed)
Spoke with [employer] per Dr. Darnell Level.

## 2017-04-25 NOTE — Telephone Encounter (Signed)
Agree with this. Thank you.  

## 2017-04-30 DIAGNOSIS — M1611 Unilateral primary osteoarthritis, right hip: Secondary | ICD-10-CM | POA: Diagnosis not present

## 2017-04-30 DIAGNOSIS — R296 Repeated falls: Secondary | ICD-10-CM | POA: Diagnosis not present

## 2017-04-30 DIAGNOSIS — E440 Moderate protein-calorie malnutrition: Secondary | ICD-10-CM | POA: Diagnosis not present

## 2017-04-30 DIAGNOSIS — M5032 Other cervical disc degeneration, mid-cervical region, unspecified level: Secondary | ICD-10-CM | POA: Diagnosis not present

## 2017-04-30 DIAGNOSIS — G600 Hereditary motor and sensory neuropathy: Secondary | ICD-10-CM | POA: Diagnosis not present

## 2017-04-30 DIAGNOSIS — J449 Chronic obstructive pulmonary disease, unspecified: Secondary | ICD-10-CM | POA: Diagnosis not present

## 2017-04-30 DIAGNOSIS — I2510 Atherosclerotic heart disease of native coronary artery without angina pectoris: Secondary | ICD-10-CM | POA: Diagnosis not present

## 2017-04-30 DIAGNOSIS — K7460 Unspecified cirrhosis of liver: Secondary | ICD-10-CM | POA: Diagnosis not present

## 2017-04-30 DIAGNOSIS — G894 Chronic pain syndrome: Secondary | ICD-10-CM | POA: Diagnosis not present

## 2017-05-01 ENCOUNTER — Telehealth: Payer: Self-pay | Admitting: Family Medicine

## 2017-05-01 NOTE — Telephone Encounter (Signed)
Copied from [place]. Topic: Quick Communication - See Telephone Encounter >> May 01, 2017 10:35 AM Antonieta Iba C wrote: CRM for notification. See Telephone encounter for:  05/01/17.   OT Sherlynn Stalls w/ [facility] called in to make provider aware that  Pt fell on 04/25/17 at 4:00a. She said that the pt does not have any concerns she would just like to make PCP aware.

## 2017-05-01 NOTE — Telephone Encounter (Signed)
I spoke with [name] and pt fell at on 04/25/17 on his way to bathroom. No apparent injury; wanted Dr Darnell Level to be aware. Pt is a fall risk per [name]. [name] educating pt on placement of bedside commode and use of urinal. FYI to Dr Darnell Level.

## 2017-05-06 ENCOUNTER — Telehealth: Payer: Self-pay | Admitting: Family Medicine

## 2017-05-06 DIAGNOSIS — M1611 Unilateral primary osteoarthritis, right hip: Secondary | ICD-10-CM

## 2017-05-06 DIAGNOSIS — G600 Hereditary motor and sensory neuropathy: Secondary | ICD-10-CM

## 2017-05-06 DIAGNOSIS — K7460 Unspecified cirrhosis of liver: Secondary | ICD-10-CM

## 2017-05-06 DIAGNOSIS — L732 Hidradenitis suppurativa: Secondary | ICD-10-CM

## 2017-05-06 NOTE — Telephone Encounter (Signed)
Reviewed request from Dr Carolin Coy office. Request ID eval due to recurrent infections in h/o hydradenitis. Referral placed.

## 2017-05-06 NOTE — Telephone Encounter (Signed)
Noted  

## 2017-05-06 NOTE — Telephone Encounter (Signed)
Copied from [place] [id number]. Topic: Referral - Request >> Apr 24, 2017 12:25 PM Arletha Grippe wrote: Reason for CRM: wife called, she is requesting referral for center for disease control - for boils that he is having between legs. Dr at [facility] thinks that he needs referral to them.   They think the office is at [facility].  Cb number is [phone] or [phone]

## 2017-05-07 DIAGNOSIS — F17200 Nicotine dependence, unspecified, uncomplicated: Secondary | ICD-10-CM | POA: Diagnosis not present

## 2017-05-07 DIAGNOSIS — M545 Low back pain: Secondary | ICD-10-CM | POA: Diagnosis not present

## 2017-05-07 DIAGNOSIS — Z79891 Long term (current) use of opiate analgesic: Secondary | ICD-10-CM | POA: Diagnosis not present

## 2017-05-07 DIAGNOSIS — Z720 Tobacco use: Secondary | ICD-10-CM | POA: Diagnosis not present

## 2017-05-07 DIAGNOSIS — G894 Chronic pain syndrome: Secondary | ICD-10-CM | POA: Diagnosis not present

## 2017-05-07 DIAGNOSIS — M542 Cervicalgia: Secondary | ICD-10-CM | POA: Diagnosis not present

## 2017-05-07 DIAGNOSIS — Z716 Tobacco abuse counseling: Secondary | ICD-10-CM | POA: Diagnosis not present

## 2017-05-07 DIAGNOSIS — Z87891 Personal history of nicotine dependence: Secondary | ICD-10-CM | POA: Diagnosis not present

## 2017-05-07 NOTE — Telephone Encounter (Signed)
Appt with Dr Marcell Anger tomorrow at 3pm, patient aware.

## 2017-05-08 ENCOUNTER — Ambulatory Visit (INDEPENDENT_AMBULATORY_CARE_PROVIDER_SITE_OTHER): Payer: Medicare PPO | Admitting: Internal Medicine

## 2017-05-08 ENCOUNTER — Encounter: Payer: Self-pay | Admitting: Internal Medicine

## 2017-05-08 DIAGNOSIS — B372 Candidiasis of skin and nail: Secondary | ICD-10-CM | POA: Diagnosis not present

## 2017-05-08 MED ORDER — FLUCONAZOLE 100 MG PO TABS: 100.0000 mg | ORAL_TABLET | Freq: Every day | ORAL | 0 refills | Status: DC

## 2017-05-08 MED ORDER — NYSTATIN 100000 UNIT/GM EX POWD: Freq: Two times a day (BID) | CUTANEOUS | 3 refills | Status: DC

## 2017-05-08 NOTE — Progress Notes (Signed)
[facility]  Reason for Consult: History of hidradenitis Referring Physician: Dr. Ria Bush  Assessment: His current problem is most compatible with Candida intertrigo. I do not see any evidence of active hidradenitis at this time. I have instructed him to bathe daily and to dry the area with a hair dryer on the low heat setting. I will have him apply nystatin powder twice daily and have him take oral fluconazole 100 mg daily for 2 weeks.   Plan: 1. Fluconazole 100 mg daily for 14 days 2. Nystatin powder applied to scrotum and groin twice daily 3. Follow-up here in 2 weeks   Patient Active Problem List   Diagnosis Date Noted  . Candidal intertrigo 05/08/2017    Priority: High  . Hidradenitis 05/03/2010    Priority: High  . Recurrent falls 03/13/2017  . Abnormal drug screen 07/09/2016  . Thrombocytopenia ([place]) 04/29/2016  . Anemia 04/29/2016  . Protein-calorie malnutrition ([place]) 04/29/2016  . Encounter for chronic pain management 01/04/2016  . Preop cardiovascular exam 09/01/2014  . Chronic fatigue 07/22/2014  . Esophageal dysphagia 07/22/2014  . Gross hematuria 05/19/2014  . Health maintenance examination 04/21/2014  . Advanced care planning/counseling discussion 04/21/2014  . Orthostatic hypotension 03/13/2014  . Cirrhosis of liver without ascites ([place]) 01/03/2014  . Osteoarthritis of right hip 08/16/2012  . Medicare annual wellness visit, subsequent 06/07/2012  . AAA (abdominal aortic aneurysm) ([place])   . DOE (dyspnea on exertion) 10/06/2011  . DDD (degenerative disc disease), cervical   . Vitamin D deficiency 03/14/2011  . Skin rash 03/14/2011  . History of non anemic vitamin B12 deficiency 08/05/2010  . Disturbances of sensation of smell and taste 08/02/2010  . Prediabetes 06/27/2010  . OSA (obstructive sleep apnea) 05/16/2010  . Cervical spondylosis 05/05/2010  . BMI 45.0-49.9, adult ([place]) 05/03/2010  . Ex-smoker  10/21/2009  . HLD (hyperlipidemia) 09/07/2009  . Gout 09/07/2009  . 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 Essential hypertension 09/07/2009  . Coronary atherosclerosis 09/07/2009  . CARDIOMYOPATHY 09/07/2009  . COPD, minimal 09/07/2009  . Chronic pain syndrome 09/07/2009      Medication List        Accurate as of 05/08/17  3:23 PM. Always use your most recent med list.          * albuterol (2.5 MG/3ML) 0.083% nebulizer solution Commonly known as:  PROVENTIL USE 1 VIAL PER NEBULIZER EVERY 6 HRS AS NEEDED FOR WHEEZING   * albuterol 108 (90 Base) MCG/ACT inhaler Commonly known as:  PROVENTIL HFA;VENTOLIN HFA Inhale 2 puffs into the lungs every 6 (six) hours as needed for wheezing or shortness of breath.   aspirin 325 MG tablet Take 1 tablet (325 mg total) by mouth daily.   chlorhexidine 4 % external liquid Commonly known as:  HIBICLENS Apply topically daily as needed.   dicyclomine 20 MG tablet Commonly known as:  BENTYL Take 1 tablet (20 mg total) by mouth 3 (three) times daily before meals. As needed for abdominal pain   DULoxetine 60 MG capsule Commonly known as:  CYMBALTA Take 1 capsule (60 mg total) by mouth daily.   * fentaNYL 25 MCG/HR patch Commonly known as:  DURAGESIC Place 1 patch (25 mcg total) onto the skin every other day. Alternating with 32mg patch   * fentaNYL 50 MCG/HR Commonly known as:  DURAGESIC - dosed mcg/hr Place 1 patch (50 mcg total) every other day onto the skin.   fluconazole 100 MG  tablet Commonly known as:  DIFLUCAN Take 1 tablet (100 mg total) by mouth daily.   fluticasone 50 MCG/ACT nasal spray Commonly known as:  FLONASE   folic acid 1 MG tablet Commonly known as:  FOLVITE Take 1 tablet (1 mg total) by mouth daily.   glucose blood test strip 1 each by Other route as needed for other. Use as instructed to check sugar once daily and as needed. **ONE TOUCH ULTRA** Dx: E11.9   irbesartan 75 MG tablet Commonly known as:   AVAPRO TAKE 1/2 TABLET BY MOUTH ONCE DAILY   IRON SUPPLEMENT PO   loperamide 2 MG capsule Commonly known as:  IMODIUM Take 1 capsule (2 mg total) by mouth 4 (four) times daily as needed for diarrhea or loose stools.   metoprolol succinate 25 MG 24 hr tablet Commonly known as:  TOPROL-XL Take 0.5 tablets (12.5 mg total) by mouth daily.   nitroGLYCERIN 0.4 MG SL tablet Commonly known as:  NITROSTAT Place 1 tablet (0.4 mg total) under the tongue every 5 (five) minutes as needed for chest pain.   nortriptyline 25 MG capsule Commonly known as:  PAMELOR Take 1 capsule (25 mg total) by mouth at bedtime.   nystatin powder Commonly known as:  MYCOSTATIN/NYSTOP Apply topically 2 (two) times daily.   omeprazole 40 MG capsule Commonly known as:  PRILOSEC Take 1 capsule (40 mg total) by mouth daily.   ondansetron 4 MG disintegrating tablet Commonly known as:  ZOFRAN-ODT DISSOLVE 1 OR 2 TABLETS IN THE MOUTH EVERY 8 (EIGHT) HOURS AS NEEDED FOR NAUSEA AND/OR VOMITING   oxyCODONE 15 MG immediate release tablet Commonly known as:  ROXICODONE Take 1 tablet (15 mg total) every 4 (four) hours as needed by mouth.   polyethylene glycol powder powder Commonly known as:  GLYCOLAX/MIRALAX Take 17 g by mouth daily as needed for moderate constipation.   Red Yeast Rice 600 MG Caps   SUPER B COMPLEX PO   TART CHERRY ADVANCED Caps   vitamin B-12 500 MCG tablet Commonly known as:  CYANOCOBALAMIN   Vitamin D3 2000 units Tabs      * This list has 4 medication(s) that are the same as other medications prescribed for you. Read the directions carefully, and ask your doctor or other care provider to review them with you.          Where to Get Your Medications    These medications were sent to [facility] [place] - [address]  [address], [place] [address]   Phone:  [phone]   fluconazole 100 MG tablet  nystatin powder     HPI:  [name] is a 61 y.o. male with history of hidradenitis causing infection in his right groin. 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 He says that about 4 years ago he began to have problems with infection in his right groin. He was told that it was due to hidradenitis. He saw Dr. Nedra Hai who did a wide excision of his right groin lesions on 12/31/2014. He says that his infection came back about 2 weeks after surgery. He was evaluated last year on several occasions by Lennie Odor PA, a local dermatology provider. He recalls being on an antibiotic for about 4 months. He believes it was doxycycline. He says that he continued to  have problems and eventually stop the antibiotic. He has never had to have any other lesions incised and dined. Currently, he has diffuse discomfort and mild pain in his groin bilaterally and covering his scrotum. He notes a prominent foul odor and weeping that requires him to change underwear several times daily  He has been requiring frequent steroid injections of his right hip to try to control his pain. He is trying to lose weight so that he would become a candidate for hip replacement surgery. His surgeons have told him that he would need to have his infection completely controlled before they would consider surgery.  Review of Systems: Review of Systems  Constitutional: Positive for weight loss. Negative for chills, diaphoresis, fever and malaise/fatigue.  HENT: Negative for congestion and sore throat.   Respiratory: Negative for cough, sputum production and shortness of breath.   Cardiovascular: Negative for chest pain.  Gastrointestinal: Negative for abdominal pain, diarrhea, heartburn, nausea and vomiting.  Genitourinary: Negative for dysuria and frequency.  Musculoskeletal: Positive for back pain and joint  pain. Negative for myalgias.  Skin: Positive for itching and rash.  Neurological: Positive for sensory change and focal weakness. Negative for dizziness and headaches.  Psychiatric/Behavioral: Negative for depression and substance abuse. The patient is not nervous/anxious.       Past Medical History:  Diagnosis Date  . AAA (abdominal aortic aneurysm) ([place]) 09/2012--  monitored by dr Trula Slade   stable 5.6cm CTA abdomen 2016  . Abnormal drug screen 07/09/2016   1/2/018 - positive oxycodone, fentanyl, inapprop positive MJ - mod risk  . Allergic rhinitis   . B12 deficiency   . CAD (coronary artery disease) cardiologist-  dr Stanford Breed   x3 with stents last 2005, EF 40%, predominantly RCA by CT 2016  . Cataracts, bilateral   . Cervical spondylosis 05/2010   s/p surgery  . Charcot Marie Tooth muscular atrophy dx  [phone]   neurologist--  dr love--  type 2 per pt  . Chronic pain syndrome    [facility] with [facility] ([place] --> disagreement and transfered care to Dr Sanjuan Dame at [facility] clinic [facility], requests PCP write Rx but f/u with pain clinic Q6-12 months  . COPD (chronic obstructive pulmonary disease) ([place]) 10/2011   minimal by PFTs  . DDD (degenerative disc disease)   . Disturbances of sensation of smell and taste    improving  . Dyspnea on exertion   . GERD (gastroesophageal reflux disease)   . Gout   . Headache   . Hepatitis    hepatitis B  . Hidradenitis    right groin  . Hidradenitis suppurativa dx 2011   goin and leg crease   followd by [facility] Herrlich - daily bactrim, s/p intralesional steroid injection 10/2010  . Hip osteoarthritis    s/p intraarticular steroid shot (12/2012) ([place])  . History of hepatitis B 1983  . History of MI (myocardial infarction)    2000  &  2005  . History of pneumonia   . History of viral meningitis 2000  . HLD (hyperlipidemia)   . HTN (hypertension)   . Ischemic cardiomyopathy    s/p inferior MI  --  current ef per  myoview 39%  . Liver cirrhosis secondary to NASH ([place]) 01/2014   by CT scan, rec virtual colonoscopy by Dr Collene Mares 06/2014  . Lumbar herniated disc   . Myocardial infarction ([place])    x2  . Nocturia more than twice per night   . Obesity   .  Spinal stenosis    released from [facility].  established with preferred [facility] (07/2013)  . T2DM (type 2 diabetes mellitus) ([place])    ABIs WNL 2016  . Vitamin D deficiency     Social History   Tobacco Use  . Smoking status: Current Every Day Smoker    Packs/day: 1.00    Years: 57.00    Pack years: 57.00    Types: Cigarettes    Start date: 06/06/1967  . Smokeless tobacco: Never Used  . Tobacco comment: stopped smoking a pipe in 2015  DOES SMOKE E CIG  Substance Use Topics  . Alcohol use: No    Alcohol/week: 0.0 oz  . Drug use: Yes    Types: Fentanyl    Family History  Problem Relation Age of Onset  . Cancer Mother        colon  . Diabetes Mother   . Kidney disease Mother   . Aneurysm Mother        AAA  . Rheum arthritis Mother   . Charcot-Marie-Tooth disease Mother   . Heart disease Mother        before age 41  . Cancer Father        skin  . Heart attack Father   . Heart disease Father        before age 64  . Cancer Brother        skin  . Coronary artery disease Brother   . Cancer Brother        small cell lung cancer  . Aneurysm Brother        AAA  . Rheum arthritis Sister   . Rheum arthritis Brother   . Prostate cancer Neg Hx   . Bladder Cancer Neg Hx   . Kidney cancer Neg Hx    Allergies  Allergen Reactions  . Statins Shortness Of Breath    Cough, trouble breathing  . Losartan Other (See Comments)    Causes him to have pain  . Tramadol Nausea Only  . Allopurinol Nausea Only  . Baclofen Nausea And Vomiting  . Gabapentin Nausea And Vomiting    OBJECTIVE: Vitals:   05/08/17 1451  BP: 122/78  Pulse: 80  Temp: (!) 97.5 F (36.4 C)  TempSrc: Oral  Weight: 288 lb (130.6 kg)   Body mass index is 43.79  kg/m.   Physical Exam  Constitutional: He is oriented to person, place, and time.  He is in good spirits. He is accompanied by a close friend. His weight is down 20 pounds in the past 3 months.  HENT:  Mouth/Throat: No oropharyngeal exudate.  Cardiovascular: Normal rate and regular rhythm.  No murmur heard. Pulmonary/Chest: Effort normal and breath sounds normal. He has no wheezes. He has no rales.  Abdominal: Soft. He exhibits no distension. There is no tenderness.  Morbid obesity.  Musculoskeletal:  He stands and walks very slowly  Neurological: He is alert and oriented to person, place, and time.  He walks with the aid of a walker.  Skin:  He has diffuse erythema in his groin bilaterally and around his scrotum. The tissue is moist and weeping and malodorous. There are a few healed scars in his groin bilaterally and his axillae.he has no active boils or draining sinus tracts.  Psychiatric: Mood and affect normal.    Microbiology: No results found for this or any previous visit (from the past 240 hour(s)).  Michel Bickers, MD [facility] for Infectious Disease [facility] [phone]  [phone] pager   [phone] cell 05/08/2017, 3:23 PM

## 2017-05-14 DIAGNOSIS — G600 Hereditary motor and sensory neuropathy: Secondary | ICD-10-CM | POA: Diagnosis not present

## 2017-05-17 ENCOUNTER — Telehealth: Payer: Self-pay | Admitting: Family Medicine

## 2017-05-17 NOTE — Telephone Encounter (Signed)
Copied from [place]. Topic: Quick Communication - See Telephone Encounter >> May 17, 2017  1:02 PM Ether Griffins B wrote: CRM for notification. See Telephone encounter for:  [name] OT with [facility] calling to 1. Report a fall and pt reports just being sore and minor skin tare. 2. Needs verbal order for 2 visits next week. Call back number [phone]. One visit missed this week due to weather.  05/17/17.

## 2017-05-17 NOTE — Telephone Encounter (Signed)
Ok to give verbal order for 2 visits next week. Thanks.

## 2017-05-18 ENCOUNTER — Telehealth: Payer: Self-pay | Admitting: Family Medicine

## 2017-05-18 NOTE — Telephone Encounter (Signed)
Spoke with Izora Gala relaying message per Dr. Darnell Level. She says ok.

## 2017-05-18 NOTE — Telephone Encounter (Signed)
Agree with this. Thanks.  

## 2017-05-18 NOTE — Telephone Encounter (Signed)
Copied from [place]. Topic: General - Other >> May 18, 2017  3:44 PM Neva Seat wrote:  Jobe Gibbon w/ [facility] [phone]  Asking for verbal orders from Dr. Danise Mina for patient to continue to receive PT in home.

## 2017-05-21 NOTE — Telephone Encounter (Signed)
Spoke with Erline Levine relaying message per Dr. Darnell Level.  Says ok.

## 2017-05-22 ENCOUNTER — Ambulatory Visit: Payer: Medicare PPO | Admitting: Internal Medicine

## 2017-05-23 ENCOUNTER — Telehealth: Payer: Self-pay | Admitting: Family Medicine

## 2017-05-23 NOTE — Telephone Encounter (Signed)
Copied from [place] [phone]. Topic: Quick Communication - See Telephone Encounter >> May 23, 2017  2:20 PM Synthia Innocent wrote: CRM for notification. See Telephone encounter for:  Patient has had another today fall, no injury, soreness. Requesting verbal order 1 times a week for 2 weeks OT 05/23/17.

## 2017-05-24 NOTE — Telephone Encounter (Signed)
Agree with this. Thanks.  

## 2017-05-24 NOTE — Telephone Encounter (Signed)
Left message for [name] with [facility] letting her know Dr. Danise Mina agrees with OT for pt.

## 2017-06-06 ENCOUNTER — Telehealth: Payer: Self-pay | Admitting: *Deleted

## 2017-06-06 NOTE — Telephone Encounter (Signed)
Copied from [place] [phone]. Topic: Inquiry >> Jun 04, 2017  4:42 PM [name] wrote: Reason for CRM: Izora Gala from [facility] called to get verbal orders for extension of OT for once a week for 2 weeks

## 2017-06-06 NOTE — Telephone Encounter (Signed)
Called and spoke with Milana Na verbal orders per Dr. Darnell Level. Nothing further needed at this time.

## 2017-06-06 NOTE — Telephone Encounter (Signed)
Agree with this. Thank you.  

## 2017-06-11 ENCOUNTER — Telehealth: Payer: Self-pay | Admitting: Family Medicine

## 2017-06-11 NOTE — Telephone Encounter (Signed)
Copied from [place]. Topic: Quick Communication - See Telephone Encounter >> Jun 11, 2017  9:28 AM Ahmed Prima L wrote: CRM for notification. See Telephone encounter for:   06/11/17.  Pt's wife said she had her husband stopped having him take the anti depressant because he was sleeping 24/7. She said she started weaning him off the first week he was on it. She just wanted the Dr to know but also said he is still sleeping all the time without it. She wants to know what she needs to do. Also, she wants to know if he can have a script for the safety bars that go on the bed so he will stop rolling out of the bed. Call back is [phone]

## 2017-06-13 NOTE — Telephone Encounter (Signed)
Left message on vm per dpr relaying message per Dr. Concha Pyo rx at front office.]

## 2017-06-13 NOTE — Telephone Encounter (Signed)
Noted. plz verify he's off nortriptyline (pamelor) but continues cymbalta 54m daily.  Rx for bedrails written and in Lisa's box. Regarding sleeping - cymbalta could cause sleepiness, narcotics could cause sleepiness - would start evaluation with these meds - do we need to decrease dose? How is CPAP machine doing? Uncontrolled OSA could cause daytime sleepiness.

## 2017-06-14 ENCOUNTER — Telehealth: Payer: Self-pay | Admitting: Family Medicine

## 2017-06-14 NOTE — Telephone Encounter (Signed)
Pt returned call. [See TE, 06/14/17]

## 2017-06-14 NOTE — Telephone Encounter (Addendum)
I also spoke with pt.  Says he takes nortriptyline every other day because he was afraid to stop cold [place].  Also states he has not used CPAP for 3 yrs now. [See TE, 06/11/17]  His wife will pick up rx.

## 2017-06-14 NOTE — Telephone Encounter (Signed)
Copied from [place] [phone]. Topic: Quick Communication - See Telephone Encounter >> Jun 14, 2017  2:48 PM Boyd Kerbs wrote: CRM for notification. See Telephone encounter for:   Pt returning call to Dr. Danise Mina, (pt had quit the Nortripyline) was making him way to tired. Not sure what to do from here.   06/14/17.

## 2017-06-25 ENCOUNTER — Ambulatory Visit: Payer: Medicare PPO | Admitting: Internal Medicine

## 2017-06-26 ENCOUNTER — Encounter: Payer: Self-pay | Admitting: Internal Medicine

## 2017-06-26 ENCOUNTER — Ambulatory Visit (INDEPENDENT_AMBULATORY_CARE_PROVIDER_SITE_OTHER): Payer: Medicare PPO | Admitting: Internal Medicine

## 2017-06-26 DIAGNOSIS — B372 Candidiasis of skin and nail: Secondary | ICD-10-CM

## 2017-06-26 MED ORDER — NYSTATIN 100000 UNIT/GM EX POWD: Freq: Two times a day (BID) | CUTANEOUS | 3 refills | Status: DC

## 2017-06-26 MED ORDER — FLUCONAZOLE 100 MG PO TABS
100.0000 mg | ORAL_TABLET | Freq: Every day | ORAL | 1 refills | Status: DC
Start: 2017-06-26 — End: 2017-10-07

## 2017-06-26 NOTE — Assessment & Plan Note (Signed)
His candidal intertrigo is responding well to therapy.  I will give him a longer course of topical nystatin.  He will follow-up in 6 weeks.

## 2017-06-26 NOTE — Progress Notes (Signed)
[facility]  Patient Active Problem List   Diagnosis Date Noted  . Candidal intertrigo 05/08/2017    Priority: High  . Hidradenitis 05/03/2010    Priority: High  . Recurrent falls 03/13/2017  . Abnormal drug screen 07/09/2016  . Thrombocytopenia ([place]) 04/29/2016  . Anemia 04/29/2016  . Protein-calorie malnutrition ([place]) 04/29/2016  . Encounter for chronic pain management 01/04/2016  . Preop cardiovascular exam 09/01/2014  . Chronic fatigue 07/22/2014  . Esophageal dysphagia 07/22/2014  . Gross hematuria 05/19/2014  . Health maintenance examination 04/21/2014  . Advanced care planning/counseling discussion 04/21/2014  . Orthostatic hypotension 03/13/2014  . Cirrhosis of liver without ascites ([place]) 01/03/2014  . Osteoarthritis of right hip 08/16/2012  . Medicare annual wellness visit, subsequent 06/07/2012  . AAA (abdominal aortic aneurysm) ([place])   . DOE (dyspnea on exertion) 10/06/2011  . DDD (degenerative disc disease), cervical   . Vitamin D deficiency 03/14/2011  . Skin rash 03/14/2011  . History of non anemic vitamin B12 deficiency 08/05/2010  . Disturbances of sensation of smell and taste 08/02/2010  . Prediabetes 06/27/2010  . OSA (obstructive sleep apnea) 05/16/2010  . Cervical spondylosis 05/05/2010  . BMI 45.0-49.9, adult ([place]) 05/03/2010  . Ex-smoker 10/21/2009  . HLD (hyperlipidemia) 09/07/2009  . Gout 09/07/2009  . 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 Essential hypertension 09/07/2009  . Coronary atherosclerosis 09/07/2009  . CARDIOMYOPATHY 09/07/2009  . COPD, minimal 09/07/2009  . Chronic pain syndrome 09/07/2009    Patient's Medications  New Prescriptions   No medications on file  Previous Medications   ALBUTEROL (PROVENTIL HFA;VENTOLIN HFA) 108 (90 BASE) MCG/ACT INHALER    Inhale 2 puffs into the lungs every 6 (six) hours as needed for wheezing or shortness of breath.   ALBUTEROL (PROVENTIL) (2.5 MG/3ML)  0.083% NEBULIZER SOLUTION    USE 1 VIAL PER NEBULIZER EVERY 6 HRS AS NEEDED FOR WHEEZING   ASPIRIN 325 MG TABLET    Take 1 tablet (325 mg total) by mouth daily.   B COMPLEX-C (SUPER B COMPLEX PO)    Take 1 tablet by mouth daily.   CHLORHEXIDINE (HIBICLENS) 4 % EXTERNAL LIQUID    Apply topically daily as needed.   CHOLECALCIFEROL 2000 UNITS TABS    Take 2,000 Units by mouth daily.   DICYCLOMINE (BENTYL) 20 MG TABLET    Take 1 tablet (20 mg total) by mouth 3 (three) times daily before meals. As needed for abdominal pain   DULOXETINE (CYMBALTA) 60 MG CAPSULE    Take 1 capsule (60 mg total) by mouth daily.   FENTANYL (DURAGESIC - DOSED MCG/HR) 50 MCG/HR    Place 1 patch (50 mcg total) every other day onto the skin.   FENTANYL (DURAGESIC) 25 MCG/HR PATCH    Place 1 patch (25 mcg total) onto the skin every other day. Alternating with 49mg patch   FLUTICASONE (FLONASE) 50 MCG/ACT NASAL SPRAY    Place 2 sprays into both nostrils daily as needed for allergies.    FOLIC ACID (FOLVITE) 1 MG TABLET    Take 1 tablet (1 mg total) by mouth daily.   IRBESARTAN (AVAPRO) 75 MG TABLET    TAKE 1/2 TABLET BY MOUTH ONCE DAILY   LOPERAMIDE (IMODIUM) 2 MG CAPSULE    Take 1 capsule (2 mg total) by mouth 4 (four) times daily as needed for diarrhea or loose stools.   METOPROLOL SUCCINATE (TOPROL-XL) 25 MG 24 HR TABLET    Take 0.5  tablets (12.5 mg total) by mouth daily.   MISC NATURAL PRODUCTS (TART CHERRY ADVANCED) CAPS    Take 1 capsule by mouth 2 (two) times daily.   NITROGLYCERIN (NITROSTAT) 0.4 MG SL TABLET    Place 1 tablet (0.4 mg total) under the tongue every 5 (five) minutes as needed for chest pain.   NORTRIPTYLINE (PAMELOR) 25 MG CAPSULE       OMEPRAZOLE (PRILOSEC) 40 MG CAPSULE    Take 1 capsule (40 mg total) by mouth daily.   ONDANSETRON (ZOFRAN-ODT) 4 MG DISINTEGRATING TABLET    DISSOLVE 1 OR 2 TABLETS IN THE MOUTH EVERY 8 (EIGHT) HOURS AS NEEDED FOR NAUSEA AND/OR VOMITING   OXYCODONE (ROXICODONE) 15 MG  IMMEDIATE RELEASE TABLET    Take 1 tablet (15 mg total) every 4 (four) hours as needed by mouth.   POLYETHYLENE GLYCOL POWDER (GLYCOLAX/MIRALAX) POWDER    Take 17 g by mouth daily as needed for moderate constipation.   RED YEAST RICE 600 MG CAPS    Take 2 capsules by mouth daily.   VITAMIN B-12 (CYANOCOBALAMIN) 500 MCG TABLET    Take 500 mcg by mouth daily.  Modified Medications   Modified Medication Previous Medication   FLUCONAZOLE (DIFLUCAN) 100 MG TABLET fluconazole (DIFLUCAN) 100 MG tablet      Take 1 tablet (100 mg total) by mouth daily.    Take 1 tablet (100 mg total) by mouth daily.   NYSTATIN (MYCOSTATIN/NYSTOP) POWDER nystatin (MYCOSTATIN/NYSTOP) powder      Apply topically 2 (two) times daily.    Apply topically 2 (two) times daily.  Discontinued Medications   FERROUS SULFATE (IRON SUPPLEMENT PO)    Take 65 mg by mouth daily.   GLUCOSE BLOOD TEST STRIP    1 each by Other route as needed for other. Use as instructed to check sugar once daily and as needed. **ONE TOUCH ULTRA** Dx: E11.9    Subjective: [name] is in for his routine follow-up visit.  He is accompanied by his wife, [name].  He completed his 2-week course of fluconazole along with topical nystatin powder.  He noted the significant improvement but thinks she has gotten a little bit worse since stopping fluconazole several weeks ago.  Review of Systems: Review of Systems  Constitutional: Negative for chills, diaphoresis and fever.  Skin: Positive for rash. Negative for itching.    Past Medical History:  Diagnosis Date  . AAA (abdominal aortic aneurysm) ([place]) 09/2012--  monitored by dr Trula Slade   stable 5.6cm CTA abdomen 2016  . Abnormal drug screen 07/09/2016   1/2/018 - positive oxycodone, fentanyl, inapprop positive MJ - mod risk  . Allergic rhinitis   . B12 deficiency   . CAD (coronary artery disease) cardiologist-  dr Stanford Breed   x3 with stents last 2005, EF 40%, predominantly RCA by CT 2016  . Cataracts, bilateral     . Cervical spondylosis 05/2010   s/p surgery  . Charcot Marie Tooth muscular atrophy dx  [phone]   neurologist--  dr love--  type 2 per pt  . Chronic pain syndrome    [facility] --> disagreement and transfered care to Dr Sanjuan Dame at [facility] clinic [facility], requests PCP write Rx but f/u with pain clinic Q6-12 months  . COPD (chronic obstructive pulmonary disease) ([place]) 10/2011   minimal by PFTs  . DDD (degenerative disc disease)   . Disturbances of sensation of smell and taste    improving  . Dyspnea on exertion   .  GERD (gastroesophageal reflux disease)   . Gout   . Headache   . Hepatitis    hepatitis B  . Hidradenitis    right groin  . Hidradenitis suppurativa dx 2011   goin and leg crease   followd by Lyndle Herrlich - daily bactrim, s/p intralesional steroid injection 10/2010  . Hip osteoarthritis    s/p intraarticular steroid shot (12/2012) ([place])  . History of hepatitis B 1983  . History of MI (myocardial infarction)    2000  &  2005  . History of pneumonia   . History of viral meningitis 2000  . HLD (hyperlipidemia)   . HTN (hypertension)   . Ischemic cardiomyopathy    s/p inferior MI  --  current ef per myoview 39%  . Liver cirrhosis secondary to NASH ([place]) 01/2014   by CT scan, rec virtual colonoscopy by Dr Collene Mares 06/2014  . Lumbar herniated disc   . Myocardial infarction ([place])    x2  . Nocturia more than twice per night   . Obesity   . Spinal stenosis    released from [place].  established with [facility] (07/2013)  . T2DM (type 2 diabetes mellitus) ([place])    ABIs WNL 2016  . Vitamin D deficiency     Social History   Tobacco Use  . Smoking status: Current Every Day Smoker    Packs/day: 1.00    Years: 57.00    Pack years: 57.00    Types: Cigarettes    Start date: 06/06/1967  . Smokeless tobacco: Never Used  . Tobacco comment: stopped smoking a pipe in 2015  DOES SMOKE E CIG  Substance Use Topics  . Alcohol use: No     Alcohol/week: 0.0 oz  . Drug use: Yes    Types: Fentanyl    Family History  Problem Relation Age of Onset  . Cancer Mother        colon  . Diabetes Mother   . Kidney disease Mother   . Aneurysm Mother        AAA  . Rheum arthritis Mother   . Charcot-Marie-Tooth disease Mother   . Heart disease Mother        before age 55  . Cancer Father        skin  . Heart attack Father   . Heart disease Father        before age 7  . Cancer Brother        skin  . Coronary artery disease Brother   . Cancer Brother        small cell lung cancer  . Aneurysm Brother        AAA  . Rheum arthritis Sister   . Rheum arthritis Brother   . Prostate cancer Neg Hx   . Bladder Cancer Neg Hx   . Kidney cancer Neg Hx     Allergies  Allergen Reactions  . Statins Shortness Of Breath    Cough, trouble breathing  . Losartan Other (See Comments)    Causes him to have pain  . Tramadol Nausea Only  . Allopurinol Nausea Only  . Baclofen Nausea And Vomiting  . Gabapentin Nausea And Vomiting    Objective: Vitals:   06/26/17 1056  Weight: 270 lb (122.5 kg)  Height: 5' 7"  (1.702 m)   Body mass index is 42.29 kg/m.  Physical Exam  Constitutional: He is oriented to person, place, and time.  Good spirits.  Neurological: He is alert and oriented to  person, place, and time.  Skin:  The weeping erythematous areas in his groin and under his abdominal pannus are markedly improved.  He told me that a boil drained at this morning on his right inner thigh.  I was able to see a very small hole where he was pointing but there was no expressible drainage, erythema or fluctuance noted.  Psychiatric: Mood and affect normal.    Lab Results    Problem List Items Addressed This Visit      High   Candidal intertrigo    His candidal intertrigo is responding well to therapy.  I will give him a longer course of topical nystatin.  He will follow-up in 6 weeks.      Relevant Medications   fluconazole  (DIFLUCAN) 100 MG tablet   nystatin (MYCOSTATIN/NYSTOP) powder       Michel Bickers, MD [facility] [phone] pager   [phone] cell 06/26/2017, 11:19 AM

## 2017-07-09 DIAGNOSIS — Z716 Tobacco abuse counseling: Secondary | ICD-10-CM | POA: Diagnosis not present

## 2017-07-09 DIAGNOSIS — G894 Chronic pain syndrome: Secondary | ICD-10-CM | POA: Diagnosis not present

## 2017-07-09 DIAGNOSIS — M542 Cervicalgia: Secondary | ICD-10-CM | POA: Diagnosis not present

## 2017-07-09 DIAGNOSIS — F17200 Nicotine dependence, unspecified, uncomplicated: Secondary | ICD-10-CM | POA: Diagnosis not present

## 2017-07-09 DIAGNOSIS — Z79891 Long term (current) use of opiate analgesic: Secondary | ICD-10-CM | POA: Diagnosis not present

## 2017-07-09 DIAGNOSIS — Z720 Tobacco use: Secondary | ICD-10-CM | POA: Diagnosis not present

## 2017-07-09 DIAGNOSIS — Z87891 Personal history of nicotine dependence: Secondary | ICD-10-CM | POA: Diagnosis not present

## 2017-07-09 DIAGNOSIS — M545 Low back pain: Secondary | ICD-10-CM | POA: Diagnosis not present

## 2017-08-24 DIAGNOSIS — M1611 Unilateral primary osteoarthritis, right hip: Secondary | ICD-10-CM | POA: Diagnosis not present

## 2017-08-24 DIAGNOSIS — M25551 Pain in right hip: Secondary | ICD-10-CM | POA: Diagnosis not present

## 2017-09-12 DIAGNOSIS — H547 Unspecified visual loss: Secondary | ICD-10-CM | POA: Diagnosis not present

## 2017-09-12 DIAGNOSIS — I252 Old myocardial infarction: Secondary | ICD-10-CM | POA: Diagnosis not present

## 2017-09-12 DIAGNOSIS — M158 Other polyosteoarthritis: Secondary | ICD-10-CM | POA: Diagnosis not present

## 2017-09-12 DIAGNOSIS — J449 Chronic obstructive pulmonary disease, unspecified: Secondary | ICD-10-CM | POA: Diagnosis not present

## 2017-09-12 DIAGNOSIS — K219 Gastro-esophageal reflux disease without esophagitis: Secondary | ICD-10-CM | POA: Diagnosis not present

## 2017-09-12 DIAGNOSIS — L0292 Furuncle, unspecified: Secondary | ICD-10-CM | POA: Diagnosis not present

## 2017-09-12 DIAGNOSIS — F329 Major depressive disorder, single episode, unspecified: Secondary | ICD-10-CM | POA: Diagnosis not present

## 2017-09-12 DIAGNOSIS — I10 Essential (primary) hypertension: Secondary | ICD-10-CM | POA: Diagnosis not present

## 2017-09-19 ENCOUNTER — Other Ambulatory Visit: Payer: Self-pay | Admitting: Family Medicine

## 2017-09-19 ENCOUNTER — Ambulatory Visit: Payer: Medicare PPO

## 2017-09-19 DIAGNOSIS — R7303 Prediabetes: Secondary | ICD-10-CM

## 2017-09-19 DIAGNOSIS — E559 Vitamin D deficiency, unspecified: Secondary | ICD-10-CM

## 2017-09-19 DIAGNOSIS — Z125 Encounter for screening for malignant neoplasm of prostate: Secondary | ICD-10-CM

## 2017-09-19 DIAGNOSIS — E785 Hyperlipidemia, unspecified: Secondary | ICD-10-CM

## 2017-09-19 DIAGNOSIS — Z8639 Personal history of other endocrine, nutritional and metabolic disease: Secondary | ICD-10-CM

## 2017-09-19 DIAGNOSIS — M1A9XX0 Chronic gout, unspecified, without tophus (tophi): Secondary | ICD-10-CM

## 2017-09-19 DIAGNOSIS — K7460 Unspecified cirrhosis of liver: Secondary | ICD-10-CM

## 2017-09-25 ENCOUNTER — Other Ambulatory Visit: Payer: Self-pay | Admitting: Family Medicine

## 2017-09-25 DIAGNOSIS — Z716 Tobacco abuse counseling: Secondary | ICD-10-CM | POA: Diagnosis not present

## 2017-09-25 DIAGNOSIS — Z79891 Long term (current) use of opiate analgesic: Secondary | ICD-10-CM | POA: Diagnosis not present

## 2017-09-25 DIAGNOSIS — M542 Cervicalgia: Secondary | ICD-10-CM | POA: Diagnosis not present

## 2017-09-25 DIAGNOSIS — F17200 Nicotine dependence, unspecified, uncomplicated: Secondary | ICD-10-CM | POA: Diagnosis not present

## 2017-09-25 DIAGNOSIS — Z720 Tobacco use: Secondary | ICD-10-CM | POA: Diagnosis not present

## 2017-09-25 DIAGNOSIS — M545 Low back pain: Secondary | ICD-10-CM | POA: Diagnosis not present

## 2017-09-25 DIAGNOSIS — G894 Chronic pain syndrome: Secondary | ICD-10-CM | POA: Diagnosis not present

## 2017-09-25 DIAGNOSIS — Z87891 Personal history of nicotine dependence: Secondary | ICD-10-CM | POA: Diagnosis not present

## 2017-09-28 ENCOUNTER — Encounter: Payer: Medicare PPO | Admitting: Family Medicine

## 2017-10-05 ENCOUNTER — Other Ambulatory Visit: Payer: Self-pay | Admitting: Family Medicine

## 2017-10-07 ENCOUNTER — Other Ambulatory Visit: Payer: Self-pay | Admitting: Internal Medicine

## 2017-10-07 DIAGNOSIS — B372 Candidiasis of skin and nail: Secondary | ICD-10-CM

## 2017-11-10 ENCOUNTER — Other Ambulatory Visit: Payer: Self-pay | Admitting: Family Medicine

## 2017-11-15 ENCOUNTER — Encounter: Payer: Self-pay | Admitting: Family Medicine

## 2017-11-15 ENCOUNTER — Ambulatory Visit (INDEPENDENT_AMBULATORY_CARE_PROVIDER_SITE_OTHER): Payer: Medicare PPO | Admitting: Family Medicine

## 2017-11-15 VITALS — BP 120/82 | HR 86 | Temp 98.3°F | Ht 68.5 in | Wt 262.8 lb

## 2017-11-15 DIAGNOSIS — M1A9XX0 Chronic gout, unspecified, without tophus (tophi): Secondary | ICD-10-CM | POA: Diagnosis not present

## 2017-11-15 DIAGNOSIS — D696 Thrombocytopenia, unspecified: Secondary | ICD-10-CM

## 2017-11-15 DIAGNOSIS — M1611 Unilateral primary osteoarthritis, right hip: Secondary | ICD-10-CM

## 2017-11-15 DIAGNOSIS — I714 Abdominal aortic aneurysm, without rupture, unspecified: Secondary | ICD-10-CM

## 2017-11-15 DIAGNOSIS — Z0001 Encounter for general adult medical examination with abnormal findings: Secondary | ICD-10-CM | POA: Diagnosis not present

## 2017-11-15 DIAGNOSIS — E785 Hyperlipidemia, unspecified: Secondary | ICD-10-CM

## 2017-11-15 DIAGNOSIS — L732 Hidradenitis suppurativa: Secondary | ICD-10-CM

## 2017-11-15 DIAGNOSIS — R7303 Prediabetes: Secondary | ICD-10-CM | POA: Diagnosis not present

## 2017-11-15 DIAGNOSIS — K7460 Unspecified cirrhosis of liver: Secondary | ICD-10-CM

## 2017-11-15 DIAGNOSIS — K7030 Alcoholic cirrhosis of liver without ascites: Secondary | ICD-10-CM

## 2017-11-15 DIAGNOSIS — Z125 Encounter for screening for malignant neoplasm of prostate: Secondary | ICD-10-CM

## 2017-11-15 DIAGNOSIS — Z0000 Encounter for general adult medical examination without abnormal findings: Secondary | ICD-10-CM

## 2017-11-15 DIAGNOSIS — E538 Deficiency of other specified B group vitamins: Secondary | ICD-10-CM

## 2017-11-15 DIAGNOSIS — E559 Vitamin D deficiency, unspecified: Secondary | ICD-10-CM

## 2017-11-15 DIAGNOSIS — Z7189 Other specified counseling: Secondary | ICD-10-CM

## 2017-11-15 DIAGNOSIS — Z8639 Personal history of other endocrine, nutritional and metabolic disease: Secondary | ICD-10-CM

## 2017-11-15 DIAGNOSIS — I10 Essential (primary) hypertension: Secondary | ICD-10-CM

## 2017-11-15 DIAGNOSIS — G4733 Obstructive sleep apnea (adult) (pediatric): Secondary | ICD-10-CM

## 2017-11-15 DIAGNOSIS — G894 Chronic pain syndrome: Secondary | ICD-10-CM

## 2017-11-15 DIAGNOSIS — F331 Major depressive disorder, recurrent, moderate: Secondary | ICD-10-CM | POA: Diagnosis not present

## 2017-11-15 DIAGNOSIS — E440 Moderate protein-calorie malnutrition: Secondary | ICD-10-CM

## 2017-11-15 DIAGNOSIS — G600 Hereditary motor and sensory neuropathy: Secondary | ICD-10-CM

## 2017-11-15 DIAGNOSIS — F17200 Nicotine dependence, unspecified, uncomplicated: Secondary | ICD-10-CM

## 2017-11-15 LAB — CBC WITH DIFFERENTIAL/PLATELET
Basophils Absolute: 0 10*3/uL (ref 0.0–0.1)
Basophils Relative: 0.7 % (ref 0.0–3.0)
EOS ABS: 0.2 10*3/uL (ref 0.0–0.7)
EOS PCT: 2.6 % (ref 0.0–5.0)
HCT: 36.8 % — ABNORMAL LOW (ref 39.0–52.0)
Hemoglobin: 12.5 g/dL — ABNORMAL LOW (ref 13.0–17.0)
LYMPHS ABS: 1.5 10*3/uL (ref 0.7–4.0)
Lymphocytes Relative: 23.1 % (ref 12.0–46.0)
MCHC: 34 g/dL (ref 30.0–36.0)
MCV: 100.6 fl — ABNORMAL HIGH (ref 78.0–100.0)
Monocytes Absolute: 0.5 10*3/uL (ref 0.1–1.0)
Monocytes Relative: 7.2 % (ref 3.0–12.0)
Neutro Abs: 4.2 10*3/uL (ref 1.4–7.7)
Neutrophils Relative %: 66.4 % (ref 43.0–77.0)
Platelets: 70 10*3/uL — ABNORMAL LOW (ref 150.0–400.0)
RBC: 3.66 Mil/uL — AB (ref 4.22–5.81)
RDW: 15 % (ref 11.5–15.5)
WBC: 6.4 10*3/uL (ref 4.0–10.5)

## 2017-11-15 LAB — LIPID PANEL
CHOL/HDL RATIO: 4
Cholesterol: 147 mg/dL (ref 0–200)
HDL: 36.5 mg/dL — AB (ref >39.00)
LDL Cholesterol: 88 mg/dL (ref 0–99)
NonHDL: 110.89
TRIGLYCERIDES: 113 mg/dL (ref 0.0–149.0)
VLDL: 22.6 mg/dL (ref 0.0–40.0)

## 2017-11-15 LAB — PSA: PSA: 0.12 ng/mL (ref 0.10–4.00)

## 2017-11-15 LAB — COMPREHENSIVE METABOLIC PANEL
ALBUMIN: 3.1 g/dL — AB (ref 3.5–5.2)
ALK PHOS: 135 U/L — AB (ref 39–117)
ALT: 9 U/L (ref 0–53)
AST: 17 U/L (ref 0–37)
BUN: 11 mg/dL (ref 6–23)
CALCIUM: 8.9 mg/dL (ref 8.4–10.5)
CO2: 32 meq/L (ref 19–32)
CREATININE: 0.83 mg/dL (ref 0.40–1.50)
Chloride: 102 mEq/L (ref 96–112)
GFR: 99.55 mL/min (ref >60.00)
Glucose, Bld: 86 mg/dL (ref 70–99)
Potassium: 4 mEq/L (ref 3.5–5.1)
Sodium: 139 mEq/L (ref 135–145)
TOTAL PROTEIN: 7.2 g/dL (ref 6.0–8.3)
Total Bilirubin: 1 mg/dL (ref 0.2–1.2)

## 2017-11-15 LAB — HEMOGLOBIN A1C: HEMOGLOBIN A1C: 4.9 % (ref 4.6–6.5)

## 2017-11-15 LAB — URIC ACID: URIC ACID, SERUM: 6.5 mg/dL (ref 4.0–7.8)

## 2017-11-15 LAB — TSH: TSH: 1.39 u[IU]/mL (ref 0.35–4.50)

## 2017-11-15 LAB — VITAMIN D 25 HYDROXY (VIT D DEFICIENCY, FRACTURES): VITD: 16 ng/mL — AB (ref 30.00–100.00)

## 2017-11-15 LAB — VITAMIN B12: Vitamin B-12: 1060 pg/mL — ABNORMAL HIGH (ref 211–911)

## 2017-11-15 MED ORDER — METOPROLOL SUCCINATE ER 25 MG PO TB24: 12.5000 mg | ORAL_TABLET | Freq: Every day | ORAL | 11 refills | Status: DC

## 2017-11-15 MED ORDER — DULOXETINE HCL 60 MG PO CPEP: 60.0000 mg | ORAL_CAPSULE | Freq: Every day | ORAL | 11 refills | Status: DC

## 2017-11-15 MED ORDER — IRBESARTAN 75 MG PO TABS: 37.5000 mg | ORAL_TABLET | Freq: Every day | ORAL | 11 refills | Status: DC

## 2017-11-15 MED ORDER — OMEPRAZOLE 40 MG PO CPDR: 40.0000 mg | DELAYED_RELEASE_CAPSULE | Freq: Every day | ORAL | 11 refills | Status: DC

## 2017-11-15 MED ORDER — DICYCLOMINE HCL 20 MG PO TABS: 20.0000 mg | ORAL_TABLET | Freq: Three times a day (TID) | ORAL | 0 refills | Status: DC

## 2017-11-15 NOTE — Patient Instructions (Addendum)
If interested, check with pharmacy about new 2 shot shingles series (shingrix).  Advanced directive packet provided today.  Schedule follow up with vascular surgery Labs today Urinalysis today.  We will schedule abdominal ultrasound.  Trial trazodone 1/2-1 tablet at bedtime.  We will refer you for lung cancer screening CT program.  Call to schedule follow up with [facility] for urinary symptoms.

## 2017-11-15 NOTE — Progress Notes (Addendum)
BP 120/82 (BP Location: Left Arm, Patient Position: Sitting, Cuff Size: Large)   Pulse 86   Temp 98.3 F (36.8 C) (Oral)   Ht 5' 8.5" (1.74 m)   Wt 262 lb 12 oz (119.2 kg)   SpO2 98%   BMI 39.37 kg/m     Hearing Screening   125Hz  250Hz  500Hz  1000Hz  2000Hz  3000Hz  4000Hz  6000Hz  8000Hz   Right ear:   40 25 20  20     Left ear:   0 25 0  0      Visual Acuity Screening   Right eye Left eye Both eyes  Without correction: 20/70 20/30 20/30   With correction:       CC: medicare wellness visit  Subjective:    Patient ID: [name], male    DOB: [birth year], 62 y.o.   MRN: [id number]  HPI: MARTELL MCFADYEN is a 62 y.o. male presenting on 11/15/2017 for Medicare Wellness (Pt accomapanied by wife.)   Did not see [place] health advisor this year. We stopped prescribing his narcotic medication late last year. He currently receives pain medication from his pain clinic physician Dr Posey Pronto in [place]. Previously we were prescribing this per Dr Noni Saupe. Pt was upset with me because he thought I called Dr Posey Pronto and pharmacy telling them to stop prescribing medications. I advised him I did not do this at all, just that I had notified Dr Serita Grit office I would no longer prescribe narcotics due to repeatedly abnormal UDS (+MJ).    Ongoing nausea, vomiting, no appetite, weight loss. No fevers. Some night sweats. Ongoing right sided abdominal pain.   Off nortriptyline, on cymbalta 50m daily. Has not used CPAP for 3 years.  [place]  Referred to ID per PM&R request for recrrent hydradenitis, saw Dr CMegan Salon Treated for candidal intertrigo.  Ongoing urinary symptoms of dysuria, urinary accidents and incontinence.   Preventative: COLONOSCOPY Date: 05/06/2007 normal, small int hemorrhoids rpt 5 yrs due to fmhx. Virtual colonoscopy 07/2014 (Collene Mares. Told normal virgual colonoscopy.  Prostate cancer screening - PSA reassuring. DRE today.  Lung cancer screening - eligible,  interested.  Flu shot yearly Td 2007 Pneumovax 2007 Shingrix - discussed Advanced directives: Wants wife to be HCPOA but not formally set up. Packet provided today. Seat belt use discussed Sunscreen use discussed. No changing moles on skin.  Ex smoker - 1 ppd smoking, vaping. >30PY hx.  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 Interim medical history since our last visit reviewed. Allergies and medications reviewed and updated. Outpatient Medications Prior to Visit  Medication Sig Dispense Refill  . albuterol (PROVENTIL HFA;VENTOLIN HFA) 108 (90 Base) MCG/ACT inhaler Inhale 2 puffs into the lungs every 6 (six) hours as needed for wheezing or shortness of breath. 1 Inhaler 0  . albuterol (PROVENTIL) (2.5 MG/3ML) 0.083% nebulizer solution USE 1 VIAL PER NEBULIZER EVERY 6 HRS AS NEEDED FOR WHEEZING 75 mL 6  . aspirin 325 MG tablet Take 1 tablet (325 mg total) by mouth daily. 90 tablet 3  . B Complex-C (SUPER B COMPLEX PO) Take 1 tablet by mouth daily.    . chlorhexidine (HIBICLENS) 4 % external liquid Apply topically daily as needed. 473 mL  0  . cholecalciferol 2000 units TABS Take 2,000 Units by mouth daily.    . fentaNYL (DURAGESIC - DOSED MCG/HR) 50 MCG/HR Place 1 patch (50 mcg total) every other day onto the skin. 15 patch 0  . fentaNYL (DURAGESIC) 25 MCG/HR patch Place 1 patch (25 mcg total) onto the skin every other day. Alternating with 61mg patch 7 patch 0  . fluconazole (DIFLUCAN) 100 MG tablet TAKE 1 TABLET BY MOUTH DAILY 14 tablet 2  . fluticasone (FLONASE) 50 MCG/ACT nasal spray Place 2 sprays into both nostrils daily as needed for allergies.     . folic acid (FOLVITE) 1 MG tablet  Take 1 tablet (1 mg total) by mouth daily. 30 tablet 6  . loperamide (IMODIUM) 2 MG capsule Take 1 capsule (2 mg total) by mouth 4 (four) times daily as needed for diarrhea or loose stools. 12 capsule 0  . Misc Natural Products (TART CHERRY ADVANCED) CAPS Take 1 capsule by mouth 2 (two) times daily.    . nitroGLYCERIN (NITROSTAT) 0.4 MG SL tablet Place 1 tablet (0.4 mg total) under the tongue every 5 (five) minutes as needed for chest pain. 25 tablet 12  . nystatin (MYCOSTATIN/NYSTOP) powder Apply topically 2 (two) times daily. 60 g 3  . ondansetron (ZOFRAN-ODT) 4 MG disintegrating tablet TAKE 1-2 TABLETS BY MOUTH EVERY 8 HOURS AS NEEDED FOR NAUSEA OR VOMITING. DAY SUPPLY PER INS. 20 tablet 0  . oxyCODONE (ROXICODONE) 15 MG immediate release tablet Take 1 tablet (15 mg total) every 4 (four) hours as needed by mouth. 180 tablet 0  . polyethylene glycol powder (GLYCOLAX/MIRALAX) powder Take 17 g by mouth daily as needed for moderate constipation. 3350 g 0  . Red Yeast Rice 600 MG CAPS Take 2 capsules by mouth daily.    . vitamin B-12 (CYANOCOBALAMIN) 500 MCG tablet Take 500 mcg by mouth daily.    .Marland Kitchendicyclomine (BENTYL) 20 MG tablet Take 1 tablet (20 mg total) by mouth 3 (three) times daily before meals. As needed for abdominal pain 20 tablet 0  . DULoxetine (CYMBALTA) 60 MG capsule Take 1 capsule (60 mg total) by mouth daily. 30 capsule 6  . irbesartan (AVAPRO) 75 MG tablet TAKE 1/2 TABLET BY MOUTH EVERY DAY. 45 tablet 0  . metoprolol succinate (TOPROL-XL) 25 MG 24 hr tablet Take 0.5 tablets (12.5 mg total) by mouth daily. 45 tablet 1  . nortriptyline (PAMELOR) 25 MG capsule     . omeprazole (PRILOSEC) 40 MG capsule TAKE ONE CAPSULE BY MOUTH DAILY 30 capsule 0   No facility-administered medications prior to visit.      Per HPI unless specifically indicated in ROS section below Review of Systems  Constitutional: Negative for activity change, appetite change, chills, fatigue, fever and unexpected  weight change.  HENT: Negative for hearing loss.   Eyes: Negative for visual disturbance.  Respiratory: Positive for cough, chest tightness, shortness of breath and wheezing.   Cardiovascular: Negative for chest pain, palpitations and leg swelling.  Gastrointestinal: Positive for abdominal pain, constipation, nausea and vomiting. Negative for abdominal distention, blood in stool and diarrhea.  Genitourinary: Negative for difficulty urinating and hematuria.  Musculoskeletal: Negative for arthralgias, myalgias and neck pain.  Skin: Negative for rash.  Neurological: Positive for headaches. Negative for dizziness, seizures and syncope.  Hematological: Negative for adenopathy. Bruises/bleeds easily.  Psychiatric/Behavioral: Positive for dysphoric mood. The patient is nervous/anxious.        Objective:    BP 120/82 (BP Location: Left Arm,  Patient Position: Sitting, Cuff Size: Large)   Pulse 86   Temp 98.3 F (36.8 C) (Oral)   Ht 5' 8.5" (1.74 m)   Wt 262 lb 12 oz (119.2 kg)   SpO2 98%   BMI 39.37 kg/m   Wt Readings from Last 3 Encounters:  11/15/17 262 lb 12 oz (119.2 kg)  06/26/17 270 lb (122.5 kg)  05/08/17 288 lb (130.6 kg)    Physical Exam  Constitutional: He is oriented to person, place, and time. He appears well-developed and well-nourished. No distress.  HENT:  Head: Normocephalic and atraumatic.  Right Ear: Hearing, tympanic membrane, external ear and ear canal normal.  Left Ear: Hearing, tympanic membrane, external ear and ear canal normal.  Nose: Nose normal.  Mouth/Throat: Uvula is midline, oropharynx is clear and moist and mucous membranes are normal. No oropharyngeal exudate, posterior oropharyngeal edema or posterior oropharyngeal erythema.  Eyes: Pupils are equal, round, and reactive to light. Conjunctivae and EOM are normal. No scleral icterus.  Neck: Normal range of motion. Neck supple. Carotid bruit is not present. No thyromegaly present.  Cardiovascular: Normal  rate, regular rhythm, normal heart sounds and intact distal pulses.  No murmur heard. Pulses:      Radial pulses are 2+ on the right side, and 2+ on the left side.  Pulmonary/Chest: Effort normal and breath sounds normal. No respiratory distress. He has no wheezes. He has no rales.  Abdominal: Soft. Bowel sounds are normal. He exhibits no distension and no mass. There is no tenderness. There is no rebound and no guarding.  Genitourinary: Rectum normal and prostate normal. Rectal exam shows no external hemorrhoid, no internal hemorrhoid, no fissure, no mass, no tenderness and anal tone normal. Prostate is not enlarged (15gm) and not tender.  Musculoskeletal: Normal range of motion. He exhibits no edema.  Lymphadenopathy:    He has no cervical adenopathy.  Neurological: He is alert and oriented to person, place, and time.  CN grossly intact, station and gait intact Recall 3/3 Calculation 5/5 serial 3s (with wife's cues)  Skin: Skin is warm and dry. No rash noted.  Psychiatric: He has a normal mood and affect. His behavior is normal. Judgment and thought content normal.  Nursing note and vitals reviewed.   GAD 7 : Generalized Anxiety Score 11/15/2017  Nervous, Anxious, on Edge 0  Control/stop worrying 3  Worry too much - different things 3  Trouble relaxing 3  Restless 1  Easily annoyed or irritable 3  Afraid - awful might happen 0  Total GAD 7 Score 13   Depression screen [facility] 2/9 11/15/2017 05/08/2017 11/03/2016 09/19/2016 08/31/2016  Decreased Interest 3 0 1 1 3   Down, Depressed, Hopeless 3 0 1 1 1   PHQ - 2 Score 6 0 2 2 4   Altered sleeping 3 - - 1 3  Tired, decreased energy 3 - - 3 3  Change in appetite 3 - - 3 3  Feeling bad or failure about yourself  3 - - 0 0  Trouble concentrating 3 - - 1 3  Moving slowly or fidgety/restless 2 - - 0 3  Suicidal thoughts 0 - - 0 0  PHQ-9 Score 23 - - 10 19  Difficult doing work/chores - - - - Not difficult at all  Some recent data might be  hidden    Results for orders placed or performed in visit on 11/15/17  Lipid panel  Result Value Ref Range   Cholesterol 147 0 - 200 mg/dL  Triglycerides 113.0 0.0 - 149.0 mg/dL   HDL 36.50 (L) >39.00 mg/dL   VLDL 22.6 0.0 - 40.0 mg/dL   LDL Cholesterol 88 0 - 99 mg/dL   Total CHOL/HDL Ratio 4    NonHDL 110.89   Comprehensive metabolic panel  Result Value Ref Range   Sodium 139 135 - 145 mEq/L   Potassium 4.0 3.5 - 5.1 mEq/L   Chloride 102 96 - 112 mEq/L   CO2 32 19 - 32 mEq/L   Glucose, Bld 86 70 - 99 mg/dL   BUN 11 6 - 23 mg/dL   Creatinine, Ser 0.83 0.40 - 1.50 mg/dL   Total Bilirubin 1.0 0.2 - 1.2 mg/dL   Alkaline Phosphatase 135 (H) 39 - 117 U/L   AST 17 0 - 37 U/L   ALT 9 0 - 53 U/L   Total Protein 7.2 6.0 - 8.3 g/dL   Albumin 3.1 (L) 3.5 - 5.2 g/dL   Calcium 8.9 8.4 - 10.5 mg/dL   GFR 99.55 >60.00 mL/min  TSH  Result Value Ref Range   TSH 1.39 0.35 - 4.50 uIU/mL  PSA  Result Value Ref Range   PSA 0.12 0.10 - 4.00 ng/mL  CBC with Differential/Platelet  Result Value Ref Range   WBC 6.4 4.0 - 10.5 K/uL   RBC 3.66 (L) 4.22 - 5.81 Mil/uL   Hemoglobin 12.5 (L) 13.0 - 17.0 g/dL   HCT 36.8 (L) 39.0 - 52.0 %   MCV 100.6 (H) 78.0 - 100.0 fl   MCHC 34.0 30.0 - 36.0 g/dL   RDW 15.0 11.5 - 15.5 %   Platelets 70.0 (L) 150.0 - 400.0 K/uL   Neutrophils Relative % 66.4 43.0 - 77.0 %   Lymphocytes Relative 23.1 12.0 - 46.0 %   Monocytes Relative 7.2 3.0 - 12.0 %   Eosinophils Relative 2.6 0.0 - 5.0 %   Basophils Relative 0.7 0.0 - 3.0 %   Neutro Abs 4.2 1.4 - 7.7 K/uL   Lymphs Abs 1.5 0.7 - 4.0 K/uL   Monocytes Absolute 0.5 0.1 - 1.0 K/uL   Eosinophils Absolute 0.2 0.0 - 0.7 K/uL   Basophils Absolute 0.0 0.0 - 0.1 K/uL  Hemoglobin A1c  Result Value Ref Range   Hgb A1c MFr Bld 4.9 4.6 - 6.5 %  Uric acid  Result Value Ref Range   Uric Acid, Serum 6.5 4.0 - 7.8 mg/dL  Vitamin B12  Result Value Ref Range   Vitamin B-12 1,060 (H) 211 - 911 pg/mL  VITAMIN D 25 Hydroxy  (Vit-D Deficiency, Fractures)  Result Value Ref Range   VITD 16.00 (L) 30.00 - 100.00 ng/mL      Assessment & Plan:  Unable to provide UA - sent home with sterile cup.  Failed vision screen on right - encouraged call ophthalmology to schedule eye exam.  Problem List Items Addressed This Visit    Vitamin D deficiency    Check vit D level - on 2000 IU supplementation.       Relevant Orders   VITAMIN D 25 Hydroxy (Vit-D Deficiency, Fractures) (Completed)   Vitamin B12 deficiency    Update B12 level      Thrombocytopenia (HCC)    Update CBC. ?cirrhosis related      Relevant Orders   CBC with Differential/Platelet (Completed)   Smoker    Interested in lung cancer screening referral - placed.       Relevant Orders   Ambulatory Referral for Lung Cancer Scre   Severe  obesity (BMI 35.0-39.9) with comorbidity ([place])    Ongoing weight loss noted.       Protein-calorie malnutrition ([place])    Update labs. Continued weight loss.       Prediabetes    Update A1c. Anticipate improvement with weight loss.       Relevant Orders   Hemoglobin A1c (Completed)   Osteoarthritis of right hip    End stage. Awaiting replacement pending weight loss - goal weight 250lbs prior to ortho surgery.       OSA (obstructive sleep apnea)    Not using CPAP.       Medicare annual wellness visit, subsequent - Primary    I have personally reviewed the Medicare Annual Wellness questionnaire and have noted 1. The patient's medical and social history 2. Their use of alcohol, tobacco or illicit drugs 3. Their current medications and supplements 4. The patient's functional ability including ADL's, fall risks, home safety risks and hearing or visual impairment. Cognitive function has been assessed and addressed as indicated.  5. Diet and physical activity 6. Evidence for depression or mood disorders The patients weight, height, BMI have been recorded in the chart. I have made referrals, counseling and  provided education to the patient based on review of the above and I have provided the pt with a written personalized care plan for preventive services. Provider list updated.. See scanned questionairre as needed for further documentation. Reviewed preventative protocols and updated unless pt declined.       MDD (major depressive disorder), recurrent episode, moderate (HCC)    On cymbalta 88m daily. Will add trazodone 50-1046mfor sleep.  PHQ9 = 23      Relevant Medications   DULoxetine (CYMBALTA) 60 MG capsule   HLD (hyperlipidemia)    Chronic, on RYR daily. Update FLP.  The 10-year ASCVD risk score (GMikey BussingC JrBrooke Bonito et al., 2013) is: 27%   Values used to calculate the score:     Age: 89ears     Sex: Male     Is Non-Hispanic African American: No     Diabetic: Yes     Tobacco smoker: Yes     Systolic Blood Pressure: 12132mHg     Is BP treated: Yes     HDL Cholesterol: 36.5 mg/dL     Total Cholesterol: 147 mg/dL       Relevant Medications   irbesartan (AVAPRO) 75 MG tablet   metoprolol succinate (TOPROL-XL) 25 MG 24 hr tablet   Other Relevant Orders   Lipid panel (Completed)   Comprehensive metabolic panel (Completed)   TSH (Completed)   Hidradenitis (Chronic)   Health maintenance examination    Preventative protocols reviewed and updated unless pt declined. Discussed healthy diet and lifestyle.       Gout    Update uric acid. H/o allopurinol intolerance.       Relevant Orders   Uric acid (Completed)   Essential hypertension (Chronic)    Chronic, stable. Continue current regimen.       Relevant Medications   irbesartan (AVAPRO) 75 MG tablet   metoprolol succinate (TOPROL-XL) 25 MG 24 hr tablet   Cirrhosis of liver without ascites ([place]   Update labs and abd [place] for GI f/u - advised he call and schedule this.       Relevant Orders   [place]dom[place] Complete   Chronic pain syndrome    [place] No longer under  narcotic  contract with our clinic.  I did advise that if he did not feel comfortable with my care and recommendations he should seek different PCP. He stated he desired to continue under my care. We will continue providing primary care services but not pain management.       Charcot-Marie-Tooth disease    Ambulates with cane.       Relevant Medications   DULoxetine (CYMBALTA) 60 MG capsule   Advanced care planning/counseling discussion    Advanced directives: Wants wife to be HCPOA but not formally set up. Packet provided today.      AAA (abdominal aortic aneurysm) ([place])    Overdue for [facility] f/u. I did ask him to f/u with them. I will go ahead and place referral today.       Relevant Medications   irbesartan (AVAPRO) 75 MG tablet   metoprolol succinate (TOPROL-XL) 25 MG 24 hr tablet   Other Relevant Orders   Ambulatory referral to Vascular Surgery    Other Visit Diagnoses    Special screening for malignant neoplasm of prostate       Relevant Orders   PSA (Completed)       Meds ordered this encounter  Medications  . dicyclomine (BENTYL) 20 MG tablet    Sig: Take 1 tablet (20 mg total) by mouth 3 (three) times daily before meals. As needed for abdominal pain    Dispense:  30 tablet    Refill:  0  . DULoxetine (CYMBALTA) 60 MG capsule    Sig: Take 1 capsule (60 mg total) by mouth daily.    Dispense:  30 capsule    Refill:  11  . irbesartan (AVAPRO) 75 MG tablet    Sig: Take 0.5 tablets (37.5 mg total) by mouth daily.    Dispense:  15 tablet    Refill:  11  . metoprolol succinate (TOPROL-XL) 25 MG 24 hr tablet    Sig: Take 0.5 tablets (12.5 mg total) by mouth daily.    Dispense:  15 tablet    Refill:  11  . omeprazole (PRILOSEC) 40 MG capsule    Sig: Take 1 capsule (40 mg total) by mouth daily.    Dispense:  30 capsule    Refill:  11   Orders Placed This Encounter  Procedures  . US Abdomen Complete    Standing Status:   Future    Standing Expiration Date:    01/16/2019    Order Specific Question:   Reason for Exam (SYMPTOM  OR DIAGNOSIS REQUIRED)    Answer:   f/u cirrhosis    Order Specific Question:   Preferred imaging location?    Answer:   [facility]  . Lipid panel  . Comprehensive metabolic panel  . TSH  . PSA  . CBC with Differential/Platelet  . Hemoglobin A1c  . Uric acid  . Vitamin B12  . VITAMIN D 25 Hydroxy (Vit-D Deficiency, Fractures)  . Ambulatory Referral for Lung Cancer Scre    Referral Priority:   Routine    Referral Type:   Consultation    Referral Reason:   Specialty Services Required    Number of Visits Requested:   1  . Ambulatory referral to Vascular Surgery    Referral Priority:   Routine    Referral Type:   Surgical    Referral Reason:   Specialty Services Required    Requested Specialty:   Vascular Surgery    Number of Visits Requested:   1  Follow up plan: No follow-ups on file.  Ria Bush, MD

## 2017-11-17 ENCOUNTER — Other Ambulatory Visit: Payer: Self-pay | Admitting: Family Medicine

## 2017-11-17 DIAGNOSIS — F331 Major depressive disorder, recurrent, moderate: Secondary | ICD-10-CM

## 2017-11-17 DIAGNOSIS — F332 Major depressive disorder, recurrent severe without psychotic features: Secondary | ICD-10-CM | POA: Insufficient documentation

## 2017-11-17 NOTE — Assessment & Plan Note (Signed)
Check vit D level - on 2000 IU supplementation.

## 2017-11-17 NOTE — Assessment & Plan Note (Signed)
Update labs. Continued weight loss.

## 2017-11-17 NOTE — Assessment & Plan Note (Signed)
Interested in lung cancer screening referral - placed.

## 2017-11-17 NOTE — Assessment & Plan Note (Signed)
Not using CPAP.

## 2017-11-17 NOTE — Assessment & Plan Note (Signed)
On cymbalta 73m daily. Will add trazodone 50-1044mfor sleep.  PHQ9 = 23

## 2017-11-17 NOTE — Assessment & Plan Note (Signed)
Ongoing weight loss noted.

## 2017-11-17 NOTE — Assessment & Plan Note (Signed)
Ambulates with cane.

## 2017-11-17 NOTE — Assessment & Plan Note (Addendum)
Advanced directives: Wants wife to be HCPOA but not formally set up. Packet provided today.

## 2017-11-17 NOTE — Assessment & Plan Note (Addendum)
Now followed by pain management in [place], Dr Posey Pronto.  No longer under narcotic contract with our clinic.  I did advise that if he did not feel comfortable with my care and recommendations he should seek different PCP. He stated he desired to continue under my care. We will continue providing primary care services but not pain management.

## 2017-11-17 NOTE — Assessment & Plan Note (Signed)
End stage. Awaiting replacement pending weight loss - goal weight 250lbs prior to ortho surgery.

## 2017-11-17 NOTE — Assessment & Plan Note (Signed)
Update labs and abd [place]. Due for GI f/u - advised he call and schedule this.

## 2017-11-17 NOTE — Assessment & Plan Note (Signed)

## 2017-11-17 NOTE — Assessment & Plan Note (Addendum)
Overdue for [facility] f/u. I did ask him to f/u with them. I will go ahead and place referral today.

## 2017-11-17 NOTE — Assessment & Plan Note (Signed)
Chronic, on RYR daily. Update FLP.  The 10-year ASCVD risk score Mikey Bussing DC Brooke Bonito., et al., 2013) is: 27%   Values used to calculate the score:     Age: 62 years     Sex: Male     Is Non-Hispanic African American: No     Diabetic: Yes     Tobacco smoker: Yes     Systolic Blood Pressure: 233 mmHg     Is BP treated: Yes     HDL Cholesterol: 36.5 mg/dL     Total Cholesterol: 147 mg/dL

## 2017-11-17 NOTE — Assessment & Plan Note (Deleted)
Now followed by pain management in [place], Dr Posey Pronto.  No longer under narcotic contract with our clinic.

## 2017-11-17 NOTE — Assessment & Plan Note (Addendum)
Update A1c. Anticipate improvement with weight loss.

## 2017-11-17 NOTE — Assessment & Plan Note (Signed)
Update B12 level

## 2017-11-17 NOTE — Assessment & Plan Note (Signed)
Update uric acid. H/o allopurinol intolerance.

## 2017-11-17 NOTE — Assessment & Plan Note (Signed)
Preventative protocols reviewed and updated unless pt declined. Discussed healthy diet and lifestyle.  

## 2017-11-17 NOTE — Assessment & Plan Note (Signed)
Update CBC. ?cirrhosis related

## 2017-11-17 NOTE — Assessment & Plan Note (Signed)
Chronic, stable. Continue current regimen. 

## 2017-11-19 ENCOUNTER — Other Ambulatory Visit: Payer: Self-pay | Admitting: Family Medicine

## 2017-11-20 ENCOUNTER — Telehealth: Payer: Self-pay | Admitting: *Deleted

## 2017-11-20 ENCOUNTER — Ambulatory Visit
Admission: RE | Admit: 2017-11-20 | Discharge: 2017-11-20 | Disposition: A | Discharge status: Home / Self Care | Payer: Medicare PPO | Source: Ambulatory Visit | Attending: Family Medicine | Admitting: Family Medicine

## 2017-11-20 DIAGNOSIS — N281 Cyst of kidney, acquired: Secondary | ICD-10-CM | POA: Insufficient documentation

## 2017-11-20 DIAGNOSIS — K7460 Unspecified cirrhosis of liver: Secondary | ICD-10-CM | POA: Insufficient documentation

## 2017-11-20 DIAGNOSIS — K838 Other specified diseases of biliary tract: Secondary | ICD-10-CM | POA: Insufficient documentation

## 2017-11-20 DIAGNOSIS — K8689 Other specified diseases of pancreas: Secondary | ICD-10-CM | POA: Insufficient documentation

## 2017-11-20 DIAGNOSIS — R188 Other ascites: Secondary | ICD-10-CM | POA: Insufficient documentation

## 2017-11-20 DIAGNOSIS — R161 Splenomegaly, not elsewhere classified: Secondary | ICD-10-CM | POA: Insufficient documentation

## 2017-11-20 DIAGNOSIS — Z87891 Personal history of nicotine dependence: Secondary | ICD-10-CM

## 2017-11-20 DIAGNOSIS — Z122 Encounter for screening for malignant neoplasm of respiratory organs: Secondary | ICD-10-CM

## 2017-11-20 DIAGNOSIS — K7689 Other specified diseases of liver: Secondary | ICD-10-CM | POA: Diagnosis not present

## 2017-11-20 NOTE — Telephone Encounter (Signed)
error 

## 2017-11-20 NOTE — Telephone Encounter (Signed)
Received referral for initial lung cancer screening scan. Contacted patient and obtained smoking history,(current, 57 pack year) as well as answering questions related to screening process. Patient denies signs of lung cancer such as weight loss or hemoptysis. Patient denies comorbidity that would prevent curative treatment if lung cancer were found. Patient is scheduled for shared decision making visit and CT scan on 11/26/17.

## 2017-11-21 ENCOUNTER — Other Ambulatory Visit: Payer: Self-pay | Admitting: Family Medicine

## 2017-11-21 MED ORDER — TRAZODONE HCL 50 MG PO TABS: 50.0000 mg | ORAL_TABLET | Freq: Every evening | ORAL | 3 refills | Status: DC | PRN

## 2017-11-21 NOTE — Progress Notes (Signed)
Trazodone sent in as discussed at [place].

## 2017-11-22 ENCOUNTER — Telehealth: Payer: Self-pay | Admitting: Nurse Practitioner

## 2017-11-26 ENCOUNTER — Ambulatory Visit: Payer: Medicare PPO

## 2017-11-26 ENCOUNTER — Telehealth: Payer: Self-pay | Admitting: *Deleted

## 2017-11-26 ENCOUNTER — Inpatient Hospital Stay: Payer: Medicare PPO | Admitting: Nurse Practitioner

## 2017-11-26 NOTE — Telephone Encounter (Signed)
Patient's wife cancelled lung screening appt and rescheduled to next Friday.

## 2017-11-28 DIAGNOSIS — M25551 Pain in right hip: Secondary | ICD-10-CM | POA: Diagnosis not present

## 2017-11-28 DIAGNOSIS — M1611 Unilateral primary osteoarthritis, right hip: Secondary | ICD-10-CM | POA: Diagnosis not present

## 2017-12-03 ENCOUNTER — Encounter (INDEPENDENT_AMBULATORY_CARE_PROVIDER_SITE_OTHER): Payer: Medicare PPO | Admitting: Vascular Surgery

## 2017-12-05 DIAGNOSIS — M25551 Pain in right hip: Secondary | ICD-10-CM | POA: Diagnosis not present

## 2017-12-07 ENCOUNTER — Inpatient Hospital Stay: Payer: Medicare PPO | Admitting: Nurse Practitioner

## 2017-12-07 ENCOUNTER — Ambulatory Visit: Payer: Medicare PPO

## 2017-12-17 ENCOUNTER — Encounter (INDEPENDENT_AMBULATORY_CARE_PROVIDER_SITE_OTHER): Payer: Self-pay | Admitting: Vascular Surgery

## 2017-12-17 ENCOUNTER — Ambulatory Visit (INDEPENDENT_AMBULATORY_CARE_PROVIDER_SITE_OTHER): Payer: Medicare PPO | Admitting: Vascular Surgery

## 2017-12-17 VITALS — BP 131/82 | HR 70 | Resp 18 | Ht 68.0 in | Wt 256.0 lb

## 2017-12-17 DIAGNOSIS — Z5181 Encounter for therapeutic drug level monitoring: Secondary | ICD-10-CM | POA: Diagnosis not present

## 2017-12-17 DIAGNOSIS — I713 Abdominal aortic aneurysm, ruptured, unspecified: Secondary | ICD-10-CM

## 2017-12-17 DIAGNOSIS — M542 Cervicalgia: Secondary | ICD-10-CM | POA: Diagnosis not present

## 2017-12-17 DIAGNOSIS — M5030 Other cervical disc degeneration, unspecified cervical region: Secondary | ICD-10-CM | POA: Diagnosis not present

## 2017-12-17 DIAGNOSIS — Z716 Tobacco abuse counseling: Secondary | ICD-10-CM | POA: Diagnosis not present

## 2017-12-17 DIAGNOSIS — I2510 Atherosclerotic heart disease of native coronary artery without angina pectoris: Secondary | ICD-10-CM

## 2017-12-17 DIAGNOSIS — I714 Abdominal aortic aneurysm, without rupture, unspecified: Secondary | ICD-10-CM

## 2017-12-17 DIAGNOSIS — Z79891 Long term (current) use of opiate analgesic: Secondary | ICD-10-CM | POA: Diagnosis not present

## 2017-12-17 DIAGNOSIS — E785 Hyperlipidemia, unspecified: Secondary | ICD-10-CM

## 2017-12-17 DIAGNOSIS — G894 Chronic pain syndrome: Secondary | ICD-10-CM | POA: Diagnosis not present

## 2017-12-17 DIAGNOSIS — I10 Essential (primary) hypertension: Secondary | ICD-10-CM

## 2017-12-17 DIAGNOSIS — F17200 Nicotine dependence, unspecified, uncomplicated: Secondary | ICD-10-CM | POA: Diagnosis not present

## 2017-12-17 DIAGNOSIS — Z720 Tobacco use: Secondary | ICD-10-CM | POA: Diagnosis not present

## 2017-12-17 DIAGNOSIS — Z87891 Personal history of nicotine dependence: Secondary | ICD-10-CM | POA: Diagnosis not present

## 2017-12-17 DIAGNOSIS — M545 Low back pain: Secondary | ICD-10-CM | POA: Diagnosis not present

## 2017-12-17 NOTE — Progress Notes (Signed)
MRN : [id number]  [name] is a 62 y.o. (02/24/1956) male who presents with chief complaint of follow up for AAA.  History of Present Illness: The patient returns to the office for surveillance of an abdominal aortic aneurysm status post stent graft placement on 11/30/2015 by Dr's Trula Slade and Fields.  He has not followed up since his initial post op visit.  Patient denies abdominal pain or back pain, no other abdominal complaints. No groin related complaints. No symptoms consistent with distal embolization No changes in claudication distance.   There have been no interval changes in his overall healthcare since his last visit.   Patient denies amaurosis fugax or TIA symptoms. There is no history of claudication or rest pain symptoms of the lower extremities. The patient denies angina or shortness of breath.     No outpatient medications have been marked as taking for the 12/17/17 encounter (Appointment) with Delana Meyer, Dolores Lory, MD.    Past Medical History:  Diagnosis Date  . AAA (abdominal aortic aneurysm) ([place]) 09/2012--  monitored by dr Trula Slade   stable 5.6cm CTA abdomen 2016  . Abnormal drug screen 07/09/2016   1/2/018 - positive oxycodone, fentanyl, inapprop positive MJ - mod risk  . Allergic rhinitis   . B12 deficiency   . CAD (coronary artery disease) cardiologist-  dr Stanford Breed   x3 with stents last 2005, EF 40%, predominantly RCA by CT 2016  . Cataracts, bilateral   . Cervical spondylosis 05/2010   s/p surgery  . Charcot Marie Tooth muscular atrophy dx  [phone]   neurologist--  dr love--  type 2 per pt  . Chronic pain syndrome    established with [facility] --> disagreement and transfered care to Dr Sanjuan Dame at [facility] pain clinic [facility], requests PCP write Rx but f/u with pain clinic Q6-12 months  . COPD (chronic obstructive pulmonary disease) ([place]) 10/2011   minimal by PFTs  . DDD (degenerative disc disease)   . Disturbances of sensation of smell  and taste    improving  . Dyspnea on exertion   . GERD (gastroesophageal reflux disease)   . Gout   . Headache   . Hepatitis    hepatitis B  . Hidradenitis    right groin  . Hidradenitis suppurativa dx 2011   goin and leg crease   followd by Lyndle Herrlich - daily bactrim, s/p intralesional steroid injection 10/2010  . Hip osteoarthritis    s/p intraarticular steroid shot (12/2012) ([place])  . History of hepatitis B 1983  . History of MI (myocardial infarction)    2000  &  2005  . History of pneumonia   . History of viral meningitis 2000  . HLD (hyperlipidemia)   . HTN (hypertension)   . Ischemic cardiomyopathy    s/p inferior MI  --  current ef per myoview 39%  . Liver cirrhosis secondary to NASH ([place]) 01/2014   by CT scan, rec virtual colonoscopy by Dr Collene Mares 06/2014  . Lumbar herniated disc   . Myocardial infarction ([place])    x2  . Nocturia more than twice per night   . Obesity   . Spinal stenosis    released from [place].  established with preferred [facility] (07/2013)  . T2DM (type 2 diabetes mellitus) ([place])    ABIs WNL 2016  . Vitamin D deficiency     Past Surgical History:  Procedure Laterality Date  . ABDOMINAL AORTIC ENDOVASCULAR FENESTRATED STENT GRAFT N/A 11/30/2015   Procedure:  ABDOMINAL AORTIC ENDOVASCULAR FENESTRATED STENT GRAFT;  Surgeon: Serafina Mitchell, MD;  Location: [place];  Service: Vascular;  Laterality: N/A;  . ANTERIOR CERVICAL DECOMP/DISCECTOMY FUSION  01-07-2010    C4 -- C7  . CARDIAC CATHETERIZATION  03-30-2005  dr Albertine Patricia   ef 40% w/ inferior akinesis/  LM and CFX angiographically normal/  pLAD 30%/   Widely patent stents in RCA and PDA widely patent  . CARDIOVASCULAR STRESS TEST  10-23-2012  dr Stanford Breed   No ischemia/  Moderate scar in the inferior wall, otherwise normal perfusion/  LV ef 39%,  LV wall motion: inferior/ inferolateral hypokinesis  . COLONOSCOPY  05/06/2007   normal, small int hemorrhoids rpt 5 yrs due to fmhx - rec against rpt colonoscopy  by Dr Collene Mares  . CORONARY ANGIOPLASTY  2000  dr Stanford Breed   PCI to RCA and PDA  . CORONARY ANGIOPLASTY WITH STENT PLACEMENT  03-19-2005  dr Gwyndolyn Saxon downey   inferior STEMI--- DES x4 to RCA w/ balloon angioplasty and balloon angioplasty to jailed PDA ostium/  severe hypokinesis of midinferor wall, ef 50%/  dLM 20%,  mLAD 20%,  dCFX 60%  . ESOPHAGOGASTRODUODENOSCOPY  01/2017   dilated benign esophageal stenosis, portal hypertensive gastropathy Henrene Pastor)  . HYDRADENITIS EXCISION Right 12/31/2014   Procedure: WIDE EXCISION HIDRADENITIS GROIN; Coralie Keens, MD  . LUMBAR DISC SURGERY     L5-S1  . LUMBAR LAMINECTOMY  05-18-2010   L2--5   laminectomy/foraminotomy for stenosis Joya Salm)  . MYELOGRAM     L5-S1 and L1-2 spondylosis  . SACROILIAC JOINT INJECTION Bilateral 10/2013   [place]  . TONSILLECTOMY AND ADENOIDECTOMY  1972    Social History Social History   Tobacco Use  . Smoking status: Current Every Day Smoker    Packs/day: 1.00    Years: 57.00    Pack years: 57.00    Types: Cigarettes    Start date: 06/06/1967  . Smokeless tobacco: Never Used  . Tobacco comment: stopped smoking a pipe in 2015  DOES SMOKE E CIG  Substance Use Topics  . Alcohol use: No    Alcohol/week: 0.0 oz  . Drug use: Yes    Types: Fentanyl    Family History Family History  Problem Relation Age of Onset  . Cancer Mother        colon  . Diabetes Mother   . Kidney disease Mother   . Aneurysm Mother        AAA  . Rheum arthritis Mother   . Charcot-Marie-Tooth disease Mother   . Heart disease Mother        before age 65  . Cancer Father        skin  . Heart attack Father   . Heart disease Father        before age 18  . Cancer Brother        skin  . Coronary artery disease Brother   . Cancer Brother        small cell lung cancer  . Aneurysm Brother        AAA  . Rheum arthritis Sister   . Rheum arthritis Brother   . Prostate cancer Neg Hx   . Bladder Cancer Neg Hx   . Kidney cancer Neg Hx      Allergies  Allergen Reactions  . Statins Shortness Of Breath    Cough, trouble breathing  . Losartan Other (See Comments)    Causes him to have pain  . Tramadol Nausea Only  .  Allopurinol Nausea Only  . Baclofen Nausea And Vomiting  . Gabapentin Nausea And Vomiting     REVIEW OF SYSTEMS (Negative unless checked)  Constitutional: [] Weight loss  [] Fever  [] Chills Cardiac: [] Chest pain   [] Chest pressure   [] Palpitations   [] Shortness of breath when laying flat   [] Shortness of breath with exertion. Vascular:  [x] Pain in legs with walking   [] Pain in legs at rest  [] History of DVT   [] Phlebitis   [] Swelling in legs   [] Varicose veins   [] Non-healing ulcers Pulmonary:   [] Uses home oxygen   [] Productive cough   [] Hemoptysis   [] Wheeze  [] COPD   [x] Asthma Neurologic:  [] Dizziness   [] Seizures   [] History of stroke   [] History of TIA  [] Aphasia   [] Vissual changes   [] Weakness or numbness in arm   [x] Weakness or numbness in leg Musculoskeletal:   [x] Joint swelling   [x] Joint pain   [x] Low back pain Hematologic:  [] Easy bruising  [] Easy bleeding   [] Hypercoagulable state   [] Anemic Gastrointestinal:  [] Diarrhea   [] Vomiting  [] Gastroesophageal reflux/heartburn   [] Difficulty swallowing. Genitourinary:  [] Chronic kidney disease   [] Difficult urination  [] Frequent urination   [] Blood in urine Skin:  [] Rashes   [] Ulcers  Psychological:  [] History of anxiety   []  History of major depression.  Physical Examination  There were no vitals filed for this visit. There is no height or weight on file to calculate BMI. Gen: WD/WN, NAD Head: [place]/AT, No temporalis wasting.  Ear/Nose/Throat: Hearing grossly intact, nares w/o erythema or drainage Eyes: PER, EOMI, sclera nonicteric.  Neck: Supple, no large masses.   Pulmonary:  Good air movement, no audible wheezing bilaterally, no use of accessory muscles.  Cardiac: RRR, no JVD Vascular: scattered varicosities present bilaterally.  Mild venous  stasis changes to the legs bilaterally.  2+ soft pitting edema Vessel Right Left  Radial Palpable Palpable  Popliteal 1+ Palpable 1+ Palpable  PT Palpable Palpable  DP Palpable Palpable  Gastrointestinal: Non-distended. No guarding/no peritoneal signs.  Musculoskeletal: M/S 5/5 throughout.  No deformity or atrophy.  Neurologic: CN 2-12 intact. Symmetrical.  Speech is fluent. Motor exam as listed above. Psychiatric: Judgment intact, Mood & affect appropriate for pt's clinical situation. Dermatologic: No rashes or ulcers noted.  No changes consistent with cellulitis. Lymph : No lichenification or skin changes of chronic lymphedema.  CBC Lab Results  Component Value Date   WBC 6.4 11/15/2017   HGB 12.5 (L) 11/15/2017   HCT 36.8 (L) 11/15/2017   MCV 100.6 (H) 11/15/2017   PLT 70.0 (L) 11/15/2017    BMET    Component Value Date/Time   NA 139 11/15/2017 1100   K 4.0 11/15/2017 1100   CL 102 11/15/2017 1100   CO2 32 11/15/2017 1100   GLUCOSE 86 11/15/2017 1100   BUN 11 11/15/2017 1100   CREATININE 0.83 11/15/2017 1100   CREATININE 0.80 04/09/2015 1530   CALCIUM 8.9 11/15/2017 1100   GFRNONAA >60 08/27/2016 2242   GFRNONAA >89 01/05/2014 1159   GFRAA >60 08/27/2016 2242   GFRAA >89 01/05/2014 1159   CrCl cannot be calculated (Patient's most recent lab result is older than the maximum 21 days allowed.).  COAG Lab Results  Component Value Date   INR 1.3 (H) 03/13/2017   INR 1.2 (H) 10/04/2016   INR 1.25 06/12/2016    Radiology US Abdomen Complete  Result Date: 11/20/2017 CLINICAL DATA:  Hepatic cirrhosis without ascites. EXAM: ABDOMEN ULTRASOUND COMPLETE COMPARISON:  Abdominal ultrasound  of June 12, 2016 FINDINGS: Gallbladder: No gallstones or wall thickening visualized. No sonographic Murphy sign noted by sonographer. Common bile duct: Diameter: 1.3 cm. This has been previously demonstrated. No intraluminal stones or sludge are observed. Liver: The hepatic echotexture is  heterogeneously increased. The surface contour is slightly irregular. There is no focal mass or ductal dilation. There is a small amount of ascites adjacent to the liver. Portal vein is patent on color Doppler imaging with normal direction of blood flow towards the liver. IVC: No abnormality visualized. Pancreas: Visualized portion unremarkable. The pancreatic duct is mildly prominent at 4 mm. Spleen: The spleen is enlarged measuring 15.2 x 16 x 6.4 cm with calculated volume of 816 cc. Right Kidney: Length: 12.3 cm. Echogenicity within normal limits. No mass or hydronephrosis visualized. Left Kidney: Length: 12.4 cm. Echogenicity within normal limits. There is a simple appearing upper pole cyst measuring 1.8 x 1.7 x 1.3 cm. No hydronephrosis visualized. Abdominal aorta: The abdominal aorta was largely obscured by bowel gas. The patient has a history of abdominal aortic aneurysm repair. Other findings: There is a small amount of ascites noted not only around the liver but also in the right lower quadrant. IMPRESSION: Cirrhotic changes within the liver. Small amount of ascites in the right upper and right lower quadrants. Normal appearing gallbladder. Chronic dilation of the common bile duct. Mild dilation of the pancreatic duct to 4 mm. Splenomegaly. Simple appearing upper pole cyst in the left kidney. No hydronephrosis. Electronically Signed   By: David  Martinique M.D.   On: 11/20/2017 09:39      Assessment/Plan 1. Abdominal aortic aneurysm (AAA) without rupture (HCC) Recommend: The patient has an abdominal aortic aneurysm that was 5.0 cm by duplex scan and based on a chart review he has been repaired endovascularly with a fenestrated stent graft.  The patient is otherwise in reasonable health.   Given the fact that it is a fenestrated repair which is much more complex and that he has not followed with a vascular surgeon in over 2 years the patient will require CT angiography of the abdomen and pelvis in  order to appropriately evaluate the AAA.  The risks and benefits as well as the alternative therapies was discussed in detail with the patient. All questions were answered. The patient agrees to move forward the AAA repair and therefore with CT scan.  The patient will follow up with me in the office after the CT scan to review the study.  2. Atherosclerosis of native coronary artery of native heart without angina pectoris Continue cardiac and antihypertensive medications as already ordered and reviewed, no changes at this time.  Continue statin as ordered and reviewed, no changes at this time  Nitrates PRN for chest pain   3. Essential hypertension Continue antihypertensive medications as already ordered, these medications have been reviewed and there are no changes at this time.   4. DDD (degenerative disc disease), cervical Continue NSAID medications as already ordered, these medications have been reviewed and there are no changes at this time.  Continued activity and therapy was stressed.   5. Hyperlipidemia, unspecified hyperlipidemia type Continue statin as ordered and reviewed, no changes at this time   6. Abdominal aortic aneurysm, ruptured [facility]) Not ruptured is status post repair this is a diagnosis that is been placed here by the epic computer program but it does have the required CT angios attached to it and therefore I will not delete it or I will not be able  to get the CT scan. - CT ANGIO ABDOMEN PELVIS  W &/OR WO CONTRAST    Hortencia Pilar, MD  12/17/2017 1:11 PM

## 2017-12-19 ENCOUNTER — Inpatient Hospital Stay: Payer: Medicare PPO | Attending: Oncology | Admitting: Oncology

## 2017-12-19 ENCOUNTER — Encounter (INDEPENDENT_AMBULATORY_CARE_PROVIDER_SITE_OTHER): Payer: Self-pay | Admitting: Vascular Surgery

## 2017-12-19 ENCOUNTER — Ambulatory Visit: Payer: Medicare PPO | Attending: Nurse Practitioner

## 2017-12-21 ENCOUNTER — Encounter (INDEPENDENT_AMBULATORY_CARE_PROVIDER_SITE_OTHER): Payer: Self-pay

## 2017-12-24 ENCOUNTER — Telehealth (INDEPENDENT_AMBULATORY_CARE_PROVIDER_SITE_OTHER): Payer: Self-pay | Admitting: Vascular Surgery

## 2017-12-24 NOTE — Telephone Encounter (Signed)
I inform the patient wife his CT has been schedule for 12/28/17 arrive at 10:30

## 2017-12-27 ENCOUNTER — Telehealth: Payer: Self-pay | Admitting: *Deleted

## 2017-12-27 NOTE — Telephone Encounter (Signed)
Patient is rescheduled for shared decision making visit and lung screening Ct scan tomorrow at 11am at the [facility].

## 2017-12-28 ENCOUNTER — Inpatient Hospital Stay (HOSPITAL_BASED_OUTPATIENT_CLINIC_OR_DEPARTMENT_OTHER): Payer: Medicare PPO | Admitting: Nurse Practitioner

## 2017-12-28 ENCOUNTER — Ambulatory Visit
Admission: RE | Admit: 2017-12-28 | Discharge: 2017-12-28 | Disposition: A | Discharge status: Home / Self Care | Payer: Medicare PPO | Source: Ambulatory Visit | Attending: Vascular Surgery | Admitting: Vascular Surgery

## 2017-12-28 DIAGNOSIS — I714 Abdominal aortic aneurysm, without rupture: Secondary | ICD-10-CM | POA: Insufficient documentation

## 2017-12-28 DIAGNOSIS — Z87891 Personal history of nicotine dependence: Secondary | ICD-10-CM

## 2017-12-28 DIAGNOSIS — I2510 Atherosclerotic heart disease of native coronary artery without angina pectoris: Secondary | ICD-10-CM | POA: Insufficient documentation

## 2017-12-28 DIAGNOSIS — J439 Emphysema, unspecified: Secondary | ICD-10-CM | POA: Insufficient documentation

## 2017-12-28 DIAGNOSIS — Z122 Encounter for screening for malignant neoplasm of respiratory organs: Secondary | ICD-10-CM | POA: Diagnosis not present

## 2017-12-28 DIAGNOSIS — K766 Portal hypertension: Secondary | ICD-10-CM | POA: Insufficient documentation

## 2017-12-28 DIAGNOSIS — R161 Splenomegaly, not elsewhere classified: Secondary | ICD-10-CM | POA: Insufficient documentation

## 2017-12-28 DIAGNOSIS — Z48812 Encounter for surgical aftercare following surgery on the circulatory system: Secondary | ICD-10-CM | POA: Diagnosis not present

## 2017-12-28 DIAGNOSIS — N200 Calculus of kidney: Secondary | ICD-10-CM | POA: Diagnosis not present

## 2017-12-28 DIAGNOSIS — I708 Atherosclerosis of other arteries: Secondary | ICD-10-CM | POA: Diagnosis not present

## 2017-12-28 DIAGNOSIS — R188 Other ascites: Secondary | ICD-10-CM | POA: Diagnosis not present

## 2017-12-28 DIAGNOSIS — F17210 Nicotine dependence, cigarettes, uncomplicated: Secondary | ICD-10-CM | POA: Diagnosis not present

## 2017-12-28 DIAGNOSIS — I713 Abdominal aortic aneurysm, ruptured: Secondary | ICD-10-CM | POA: Diagnosis present

## 2017-12-28 DIAGNOSIS — I700 Atherosclerosis of aorta: Secondary | ICD-10-CM | POA: Insufficient documentation

## 2017-12-28 DIAGNOSIS — K7460 Unspecified cirrhosis of liver: Secondary | ICD-10-CM | POA: Diagnosis not present

## 2017-12-28 LAB — POCT I-STAT CREATININE: Creatinine, Ser: 0.8 mg/dL (ref 0.61–1.24)

## 2017-12-28 MED ORDER — IOPAMIDOL (ISOVUE-370) INJECTION 76%
100.0000 mL | Freq: Once | INTRAVENOUS | Status: AC | PRN
  Administered 2017-12-28: 100 mL via INTRAVENOUS

## 2017-12-28 NOTE — Progress Notes (Signed)
In accordance with CMS guidelines, patient has met eligibility criteria including age, absence of signs or symptoms of lung cancer.  Social History   Tobacco Use  . Smoking status: Current Every Day Smoker    Packs/day: 1.00    Years: 57.00    Pack years: 57.00    Types: Cigarettes    Start date: 06/06/1967  . Smokeless tobacco: Never Used  . Tobacco comment: stopped smoking a pipe in 2015  DOES SMOKE E CIG  Substance Use Topics  . Alcohol use: No    Alcohol/week: 0.0 oz  . Drug use: Yes    Types: Fentanyl      A shared decision-making session was conducted prior to the performance of CT scan. This includes one or more decision aids, includes benefits and harms of screening, follow-up diagnostic testing, over-diagnosis, false positive rate, and total radiation exposure.   Counseling on the importance of adherence to annual lung cancer LDCT screening, impact of co-morbidities, and ability or willingness to undergo diagnosis and treatment is imperative for compliance of the program.   Counseling on the importance of continued smoking cessation for former smokers; the importance of smoking cessation for current smokers, and information about tobacco cessation interventions have been given to patient including [place] and 1800 quit [place] programs.   Written order for lung cancer screening with LDCT has been given to the patient and any and all questions have been answered to the best of my abilities.    Yearly follow up will be coordinated by Burgess Estelle, Thoracic Navigator.  Beckey Rutter, DNP, AGNP-C [place] at [facility] [phone] (work cell) [phone] (office) 12/28/17 11:33 AM

## 2017-12-31 ENCOUNTER — Encounter: Payer: Self-pay | Admitting: *Deleted

## 2018-01-01 ENCOUNTER — Encounter: Payer: Self-pay | Admitting: Family Medicine

## 2018-01-01 DIAGNOSIS — I700 Atherosclerosis of aorta: Secondary | ICD-10-CM | POA: Insufficient documentation

## 2018-01-02 DIAGNOSIS — I714 Abdominal aortic aneurysm, without rupture, unspecified: Secondary | ICD-10-CM | POA: Insufficient documentation

## 2018-01-02 DIAGNOSIS — M1611 Unilateral primary osteoarthritis, right hip: Secondary | ICD-10-CM | POA: Diagnosis not present

## 2018-01-02 DIAGNOSIS — Z8614 Personal history of Methicillin resistant Staphylococcus aureus infection: Secondary | ICD-10-CM | POA: Diagnosis not present

## 2018-01-02 DIAGNOSIS — I252 Old myocardial infarction: Secondary | ICD-10-CM | POA: Diagnosis not present

## 2018-01-02 DIAGNOSIS — Z720 Tobacco use: Secondary | ICD-10-CM | POA: Diagnosis not present

## 2018-01-02 DIAGNOSIS — Z79899 Other long term (current) drug therapy: Secondary | ICD-10-CM | POA: Diagnosis not present

## 2018-01-02 DIAGNOSIS — G894 Chronic pain syndrome: Secondary | ICD-10-CM | POA: Diagnosis not present

## 2018-01-02 DIAGNOSIS — L732 Hidradenitis suppurativa: Secondary | ICD-10-CM | POA: Diagnosis not present

## 2018-01-02 DIAGNOSIS — M25551 Pain in right hip: Secondary | ICD-10-CM | POA: Diagnosis not present

## 2018-01-02 DIAGNOSIS — G600 Hereditary motor and sensory neuropathy: Secondary | ICD-10-CM | POA: Diagnosis not present

## 2018-01-02 DIAGNOSIS — F17210 Nicotine dependence, cigarettes, uncomplicated: Secondary | ICD-10-CM | POA: Diagnosis not present

## 2018-01-03 ENCOUNTER — Telehealth: Payer: Self-pay | Admitting: *Deleted

## 2018-01-03 ENCOUNTER — Encounter (INDEPENDENT_AMBULATORY_CARE_PROVIDER_SITE_OTHER): Payer: Self-pay | Admitting: Vascular Surgery

## 2018-01-03 ENCOUNTER — Ambulatory Visit (INDEPENDENT_AMBULATORY_CARE_PROVIDER_SITE_OTHER): Payer: Medicare PPO | Admitting: Vascular Surgery

## 2018-01-03 VITALS — BP 150/94 | HR 89 | Resp 15 | Ht 70.0 in | Wt 265.0 lb

## 2018-01-03 DIAGNOSIS — E440 Moderate protein-calorie malnutrition: Secondary | ICD-10-CM

## 2018-01-03 DIAGNOSIS — I700 Atherosclerosis of aorta: Secondary | ICD-10-CM | POA: Diagnosis not present

## 2018-01-03 DIAGNOSIS — I714 Abdominal aortic aneurysm, without rupture, unspecified: Secondary | ICD-10-CM

## 2018-01-03 DIAGNOSIS — I10 Essential (primary) hypertension: Secondary | ICD-10-CM

## 2018-01-03 DIAGNOSIS — I2510 Atherosclerotic heart disease of native coronary artery without angina pectoris: Secondary | ICD-10-CM

## 2018-01-03 DIAGNOSIS — J449 Chronic obstructive pulmonary disease, unspecified: Secondary | ICD-10-CM | POA: Diagnosis not present

## 2018-01-03 DIAGNOSIS — K7460 Unspecified cirrhosis of liver: Secondary | ICD-10-CM

## 2018-01-03 NOTE — Telephone Encounter (Signed)
Referral placed for [facility]. Previously saw Dr Henrene Pastor. Thanks.

## 2018-01-03 NOTE — Telephone Encounter (Signed)
Spoke to pt who states he was advised by his other specialty providers, as well as Dr Danise Mina that he would need to see GI. States Dr Darnell Level is aware and has spoken to him previously about a referral, and pt is wanting to proceed

## 2018-01-03 NOTE — Telephone Encounter (Signed)
Called patient and told him his Referral was sent to [place] office and to expect a call from them.

## 2018-01-03 NOTE — Progress Notes (Signed)
MRN : [id number]  [name] is a 62 y.o. ([birth year]) male who presents with chief complaint of  Chief Complaint  Patient presents with  . Follow-up    CT results  .  History of Present Illness: The patient is seen for follow up evaluation of AAA status post CTA. There were no problems or complications related to the CT scan. The patient denies interval development of abdominal or back pain. No new lower extremity pain or discoloration of the toes.   The patient has a history of coronary artery disease, no recent episodes of angina or shortness of breath. The patient denies interval anaurosis fugax. There is o recent history of TIA symptoms or focal motor deficits. The patient denies PAD or claudication symptoms. There is a history of hyperlipidemia which is being treated with a statin.   CT angiography of the abdomen and pelvis shows a patent endograft with suprarenal fixation there is moderate stenosis of the celiac which is noted in prior scans unchanged SMA is patent the bilateral renal stents are patent the infrarenal AAA sac measures 2.6 cm.  No endo-leaks are noted.   Current Meds  Medication Sig  . albuterol (PROVENTIL HFA;VENTOLIN HFA) 108 (90 Base) MCG/ACT inhaler Inhale 2 puffs into the lungs every 6 (six) hours as needed for wheezing or shortness of breath.  [name] albuterol (PROVENTIL) (2.5 MG/3ML) 0.083% nebulizer solution USE 1 VIAL PER NEBULIZER EVERY 6 HRS AS NEEDED FOR WHEEZING  . aspirin 325 MG tablet Take 1 tablet (325 mg total) by mouth daily.  . B Complex-C (SUPER B COMPLEX PO) Take 1 tablet by mouth daily.  Antoine Poche Seed OIL Take by mouth.  . chlorhexidine (HIBICLENS) 4 % external liquid Apply topically daily as needed.  . cholecalciferol 2000 units TABS Take 2,000 Units by mouth daily.  [name] dicyclomine (BENTYL) 20 MG tablet Take 1 tablet (20 mg total) by mouth 3 (three) times daily before meals. As needed for abdominal pain  . DULoxetine (CYMBALTA) 60 MG  capsule Take 1 capsule (60 mg total) by mouth daily.  . fentaNYL (DURAGESIC - DOSED MCG/HR) 50 MCG/HR Place 1 patch (50 mcg total) every other day onto the skin.  . fentaNYL (DURAGESIC) 25 MCG/HR patch Place 1 patch (25 mcg total) onto the skin every other day. Alternating with 86mg patch  . fluconazole (DIFLUCAN) 100 MG tablet TAKE 1 TABLET BY MOUTH DAILY  . fluticasone (FLONASE) 50 MCG/ACT nasal spray Place 2 sprays into both nostrils daily as needed for allergies.   . folic acid (FOLVITE) 1 MG tablet TAKE 1 TABLET BY MOUTH DAILY  . irbesartan (AVAPRO) 75 MG tablet Take 0.5 tablets (37.5 mg total) by mouth daily.  .Marland Kitchenloperamide (IMODIUM) 2 MG capsule Take 1 capsule (2 mg total) by mouth 4 (four) times daily as needed for diarrhea or loose stools.  . metoprolol succinate (TOPROL-XL) 25 MG 24 hr tablet Take 0.5 tablets (12.5 mg total) by mouth daily.  . Misc Natural Products (TART CHERRY ADVANCED) CAPS Take 1 capsule by mouth 2 (two) times daily.  . nitroGLYCERIN (NITROSTAT) 0.4 MG SL tablet Place 1 tablet (0.4 mg total) under the tongue every 5 (five) minutes as needed for chest pain.  .Marland Kitchennystatin (MYCOSTATIN/NYSTOP) powder Apply topically 2 (two) times daily.  .Marland Kitchenomeprazole (PRILOSEC) 40 MG capsule Take 1 capsule (40 mg total) by mouth daily.  . ondansetron (ZOFRAN-ODT) 4 MG disintegrating tablet TAKE 1-2 TABLETS BY MOUTH EVERY 8 HOURS AS NEEDED FOR  NAUSEA OR VOMITING. DAY SUPPLY PER INS.  [name] oxyCODONE (ROXICODONE) 15 MG immediate release tablet Take 1 tablet (15 mg total) every 4 (four) hours as needed by mouth.  . polyethylene glycol powder (GLYCOLAX/MIRALAX) powder Take 17 g by mouth daily as needed for moderate constipation.  . Red Yeast Rice 600 MG CAPS Take 2 capsules by mouth daily.  . traZODone (DESYREL) 50 MG tablet Take 1 tablet (50 mg total) by mouth at bedtime as needed for sleep.  . vitamin B-12 (CYANOCOBALAMIN) 500 MCG tablet Take by mouth.    Past Medical History:  Diagnosis Date    . AAA (abdominal aortic aneurysm) ([place]) 09/2012--  monitored by dr Trula Slade   stable 5.6cm CTA abdomen 2016  . Abnormal drug screen 07/09/2016   1/2/018 - positive oxycodone, fentanyl, inapprop positive MJ - mod risk  . Allergic rhinitis   . B12 deficiency   . CAD (coronary artery disease) cardiologist-  dr Stanford Breed   x3 with stents last 2005, EF 40%, predominantly RCA by CT 2016  . Cataracts, bilateral   . Cervical spondylosis 05/2010   s/p surgery  . Charcot Marie Tooth muscular atrophy dx  [phone]   neurologist--  dr love--  type 2 per pt  . Chronic pain syndrome    established with [facility] ([place]) --> disagreement and transfered care to Dr Sanjuan Dame at [facility], requests PCP write Rx but f/u with pain clinic Q6-12 months  . COPD (chronic obstructive pulmonary disease) ([place]) 10/2011   minimal by PFTs  . DDD (degenerative disc disease)   . Disturbances of sensation of smell and taste    improving  . Dyspnea on exertion   . GERD (gastroesophageal reflux disease)   . Gout   . Headache   . Hepatitis    hepatitis B  . Hidradenitis    right groin  . Hidradenitis suppurativa dx 2011   goin and leg crease   followd by Lyndle Herrlich - daily bactrim, s/p intralesional steroid injection 10/2010  . Hip osteoarthritis    s/p intraarticular steroid shot (12/2012) ([place])  . History of hepatitis B 1983  . History of MI (myocardial infarction)    2000  &  2005  . History of pneumonia   . History of viral meningitis 2000  . HLD (hyperlipidemia)   . HTN (hypertension)   . Ischemic cardiomyopathy    s/p inferior MI  --  current ef per myoview 39%  . Liver cirrhosis secondary to NASH ([place]) 01/2014   by CT scan, rec virtual colonoscopy by Dr Collene Mares 06/2014  . Lumbar herniated disc   . Myocardial infarction ([place])    x2  . Nocturia more than twice per night   . Obesity   . Spinal stenosis    released from [place].  established with preferred pain (07/2013)  . T2DM  (type 2 diabetes mellitus) ([place])    ABIs WNL 2016  . Vitamin D deficiency     Past Surgical History:  Procedure Laterality Date  . ABDOMINAL AORTIC ENDOVASCULAR FENESTRATED STENT GRAFT N/A 11/30/2015   Procedure: ABDOMINAL AORTIC ENDOVASCULAR FENESTRATED STENT GRAFT;  Surgeon: Serafina Mitchell, MD;  Location: [place];  Service: Vascular;  Laterality: N/A;  . ANTERIOR CERVICAL DECOMP/DISCECTOMY FUSION  01-07-2010    C4 -- C7  . CARDIAC CATHETERIZATION  03-30-2005  dr Albertine Patricia   ef 40% w/ inferior akinesis/  LM and CFX angiographically normal/  pLAD 30%/   Widely patent stents in  RCA and PDA widely patent  . CARDIOVASCULAR STRESS TEST  10-23-2012  dr Stanford Breed   No ischemia/  Moderate scar in the inferior wall, otherwise normal perfusion/  LV ef 39%,  LV wall motion: inferior/ inferolateral hypokinesis  . COLONOSCOPY  05/06/2007   normal, small int hemorrhoids rpt 5 yrs due to fmhx - rec against rpt colonoscopy by Dr Collene Mares  . CORONARY ANGIOPLASTY  2000  dr Stanford Breed   PCI to RCA and PDA  . CORONARY ANGIOPLASTY WITH STENT PLACEMENT  03-19-2005  dr Gwyndolyn Saxon downey   inferior STEMI--- DES x4 to RCA w/ balloon angioplasty and balloon angioplasty to jailed PDA ostium/  severe hypokinesis of midinferor wall, ef 50%/  dLM 20%,  mLAD 20%,  dCFX 60%  . ESOPHAGOGASTRODUODENOSCOPY  01/2017   dilated benign esophageal stenosis, portal hypertensive gastropathy Henrene Pastor)  . HYDRADENITIS EXCISION Right 12/31/2014   Procedure: WIDE EXCISION HIDRADENITIS GROIN; Coralie Keens, MD  . LUMBAR DISC SURGERY     L5-S1  . LUMBAR LAMINECTOMY  05-18-2010   L2--5   laminectomy/foraminotomy for stenosis Joya Salm)  . MYELOGRAM     L5-S1 and L1-2 spondylosis  . SACROILIAC JOINT INJECTION Bilateral 10/2013   [place]  . TONSILLECTOMY AND ADENOIDECTOMY  1972    Social History Social History   Tobacco Use  . Smoking status: Current Every Day Smoker    Packs/day: 1.00    Years: 57.00    Pack years: 57.00    Types:  Cigarettes    Start date: 06/06/1967  . Smokeless tobacco: Never Used  . Tobacco comment: stopped smoking a pipe in 2015  DOES SMOKE E CIG  Substance Use Topics  . Alcohol use: No    Alcohol/week: 0.0 oz  . Drug use: Yes    Types: Fentanyl    Family History Family History  Problem Relation Age of Onset  . Cancer Mother        colon  . Diabetes Mother   . Kidney disease Mother   . Aneurysm Mother        AAA  . Rheum arthritis Mother   . Charcot-Marie-Tooth disease Mother   . Heart disease Mother        before age 52  . Cancer Father        skin  . Heart attack Father   . Heart disease Father        before age 88  . Cancer Brother        skin  . Coronary artery disease Brother   . Cancer Brother        small cell lung cancer  . Aneurysm Brother        AAA  . Rheum arthritis Sister   . Rheum arthritis Brother   . Prostate cancer Neg Hx   . Bladder Cancer Neg Hx   . Kidney cancer Neg Hx     Allergies  Allergen Reactions  . Statins Shortness Of Breath    Cough, trouble breathing  . Losartan Other (See Comments)    Causes him to have pain  . Tramadol Nausea Only  . Allopurinol Nausea Only  . Baclofen Nausea And Vomiting  . Gabapentin Nausea And Vomiting     REVIEW OF SYSTEMS (Negative unless checked)  Constitutional: [] Weight loss  [] Fever  [] Chills Cardiac: [] Chest pain   [] Chest pressure   [] Palpitations   [] Shortness of breath when laying flat   [] Shortness of breath with exertion. Vascular:  [x] Pain in legs with walking   [  x]Pain in legs at rest  [] History of DVT   [] Phlebitis   [x] Swelling in legs   [] Varicose veins   [] Non-healing ulcers Pulmonary:   [] Uses home oxygen   [] Productive cough   [] Hemoptysis   [] Wheeze  [] COPD   [] Asthma Neurologic:  [] Dizziness   [] Seizures   [] History of stroke   [] History of TIA  [] Aphasia   [] Vissual changes   [] Weakness or numbness in arm   [] Weakness or numbness in leg Musculoskeletal:   [x] Joint swelling   [x] Joint pain    [x] Low back pain Hematologic:  [] Easy bruising  [] Easy bleeding   [] Hypercoagulable state   [] Anemic Gastrointestinal:  [] Diarrhea   [] Vomiting  [] Gastroesophageal reflux/heartburn   [] Difficulty swallowing. Genitourinary:  [] Chronic kidney disease   [] Difficult urination  [] Frequent urination   [] Blood in urine Skin:  [] Rashes   [] Ulcers  Psychological:  [] History of anxiety   []  History of major depression.  Physical Examination  There were no vitals filed for this visit. There is no height or weight on file to calculate BMI. Gen: WD/WN, NAD Head: [place]/AT, No temporalis wasting.  Ear/Nose/Throat: Hearing grossly intact, nares w/o erythema or drainage Eyes: PER, EOMI, sclera nonicteric.  Neck: Supple, no large masses.   Pulmonary:  Good air movement, no audible wheezing bilaterally, no use of accessory muscles.  Cardiac: RRR, no JVD Vascular:  Vessel Right Left  Radial Palpable Palpable  PT Palpable Palpable  DP Palpable Palpable  Gastrointestinal: Non-distended. No guarding/no peritoneal signs.  Musculoskeletal: M/S 5/5 throughout.  No deformity or atrophy.  Neurologic: CN 2-12 intact. Symmetrical.  Speech is fluent. Motor exam as listed above. Psychiatric: Judgment intact, Mood & affect appropriate for pt's clinical situation. Dermatologic: No rashes or ulcers noted.  No changes consistent with cellulitis. Lymph : No lichenification or skin changes of chronic lymphedema.  CBC Lab Results  Component Value Date   WBC 6.4 11/15/2017   HGB 12.5 (L) 11/15/2017   HCT 36.8 (L) 11/15/2017   MCV 100.6 (H) 11/15/2017   PLT 70.0 (L) 11/15/2017    BMET    Component Value Date/Time   NA 139 11/15/2017 1100   K 4.0 11/15/2017 1100   CL 102 11/15/2017 1100   CO2 32 11/15/2017 1100   GLUCOSE 86 11/15/2017 1100   BUN 11 11/15/2017 1100   CREATININE 0.80 12/28/2017 1158   CREATININE 0.80 04/09/2015 1530   CALCIUM 8.9 11/15/2017 1100   GFRNONAA >60 08/27/2016 2242   GFRNONAA >89  01/05/2014 1159   GFRAA >60 08/27/2016 2242   GFRAA >89 01/05/2014 1159   Estimated Creatinine Clearance: 118.5 mL/min (by C-G formula based on SCr of 0.8 mg/dL).  COAG Lab Results  Component Value Date   INR 1.3 (H) 03/13/2017   INR 1.2 (H) 10/04/2016   INR 1.25 06/12/2016    Radiology Ct Chest Lung Cancer Screening Low Dose Wo Contrast  Result Date: 12/28/2017 CLINICAL DATA:  Fifty-seven pack-year smoking history. Asymptomatic current smoker. Prior cardiac stents. Severe obesity. EXAM: CT CHEST WITHOUT CONTRAST LOW-DOSE FOR LUNG CANCER SCREENING TECHNIQUE: Multidetector CT imaging of the chest was performed following the standard protocol without IV contrast. COMPARISON:  No prior screening CT. Diagnostic CT 06/12/2016 reviewed. FINDINGS: Cardiovascular: Aortic atherosclerosis. Tortuous thoracic aorta. Mild cardiomegaly, without pericardial effusion. Lipomatous hypertrophy of the interatrial septum. Multivessel coronary artery atherosclerosis. Mediastinum/Nodes: No mediastinal or definite hilar adenopathy, given limitations of unenhanced CT. Lungs/Pleura: No pleural fluid. Mild right-sided pleural thickening. Mild centrilobular emphysema. Bronchial wall thickening is most  apparent in the right lower lobe. Isolated right lower lobe subpleural pulmonary nodule of volume derived equivalent diameter 2.9 mm. Upper Abdomen: Severe cirrhosis. Probable splenomegaly, incompletely imaged. Small volume perihepatic ascites. Normal imaged portions of the stomach, pancreas, gallbladder, adrenal glands, right kidney. Incompletely imaged abdominal aortic stent graft. Musculoskeletal: Bilateral mild gynecomastia. Incompletely imaged on the right. Moderate thoracic spondylosis. Lower cervical spine fixation. IMPRESSION: 1. Lung-RADS 2, benign appearance or behavior. Continue annual screening with low-dose chest CT without contrast in 12 months. 2. Aortic atherosclerosis (ICD10-I70.0), coronary artery  atherosclerosis and emphysema (ICD10-J43.9). 3. Cirrhosis and probable splenomegaly with trace perihepatic ascites. Electronically Signed   By: Abigail Miyamoto M.D.   On: 12/28/2017 15:58   Ct Angio Abdomen Pelvis  W &/or Wo Contrast  Result Date: 12/28/2017 CLINICAL DATA:  Post endovascular repair abdominal aortic aneurysm. Right lower quadrant abdominal pain. EXAM: CT ANGIOGRAPHY ABDOMEN AND PELVIS WITH CONTRAST AND WITHOUT CONTRAST TECHNIQUE: Multidetector CT imaging of the abdomen and pelvis was performed using the standard protocol during bolus administration of intravenous contrast. Multiplanar reconstructed images and MIPs were obtained and reviewed to evaluate the vascular anatomy. CONTRAST:  144m ISOVUE-370 IOPAMIDOL (ISOVUE-370) INJECTION 76% COMPARISON:  CT abdomen and pelvis - 01/03/2016; 11/19/2015 FINDINGS: VASCULAR Aorta: Post endovascular repair of suprarenal abdominal aortic aneurysm. The stent graft appears well apposed against the walls of the suprarenal aorta as well as the bilateral common iliac arteries. There is a very minimal amount of mural thrombus within the cranial aspect of the stent graft (representative images 81 in 85, similar to the 12/2015 examination. The stent graft appears widely patent. There has been interval near complete collapse of the excluded native abdominal aorta without evidence of residual opacification of the native abdominal aorta to suggest the presence of an endoleak. No perivascular stranding. Celiac: Re-demonstrated separate origins of the common hepatic and splenic arteries in lieu of a conventional celiac trunk. There is a minimal amount of noncalcified atherosclerotic plaque involving the origin of the takeoff of the splenic artery (sagittal image 96, series 3), not resulting in a hemodynamically significant stenosis. The cranial aspect of the stent abuts the origin the takeoff of the common hepatic artery which appears narrowed resulting in approximately  50% luminal narrowing (sagittal image 92, series 13), however this is grossly unchanged compared to the 12/2015 examination. SMA: There is a minimal amount of calcified and noncalcified atherosclerotic plaque involving the origin and proximal aspect of the SMA, not resulting in hemodynamically significant stenosis. The distal tributaries the SMA appear widely patent without discrete intraluminal filling defect to suggest distal embolism. Renals: Solitary bilaterally. Stents are seen arising from the cranial aspect the aortic bypass graft extending to involve the origin and proximal aspects of the bilateral renal arteries. Both stents appear widely patent without evidence of a hemodynamically significant stenosis. No discrete vessel irregularity to suggest FMD. IMA: Expectedly occluded at its origin with reconstitution via collateral supply from the SMA. Inflow: As above, the distal limbs of the stent graft are well apposed against the walls of the bilateral common iliac arteries. The bilateral internal iliac arteries are mildly disease though patent and of normal caliber. The bilateral external iliac arteries are tortuous though of normal caliber and widely patent without hemodynamically significant narrowing. Proximal Outflow: There is a minimal amount mixed calcified and noncalcified atherosclerotic plaque involving the left common femoral artery, not resulting in a hemodynamically significant stenosis. The bilateral superficial and deep femoral arteries appear patent throughout their imaged course, however there  is a potential hemodynamically significant narrowing involving the origin of the right superficial femoral artery (image 203, series 8). Veins: The IVC and pelvic venous system appear widely patent. Several varices are noted about the gastroesophageal junction (images 9 and 10, series 19). Review of the MIP images confirms the above findings. NON-VASCULAR Lower chest: Limited visualization of the lower  thorax demonstrates minimal subsegmental atelectasis within the right lung base and bilateral costophrenic angles. No discrete focal airspace opacities. No pleural effusion. Cardiomegaly. Coronary artery calcifications. No pericardial effusion. Hepatobiliary: There is nodularity of the hepatic contour compatible with hepatic cirrhosis. No discrete hyperenhancing hepatic lesions. Several varices are noted about the gastroesophageal junction (image 10, series 19). A small amount of fluid is noted about the edge of the right lobe of the liver (image 43, series 8). Normal appearance of the gallbladder given degree distention. Common bile duct remains mildly dilated measuring approximately 1.2 cm in maximal oblique coronal diameter (coronal image 74, series 15), similar to the 12/2015 examination. No intrahepatic biliary ductal dilatation. Pancreas: Normal appearance of the pancreas Spleen: The spleen is enlarged measuring 16.6 cm in length. Note is made of a small splenule about the splenic hilum. Adrenals/Urinary Tract: There is symmetric enhancement and excretion of the bilateral kidneys. Note is made of at least 3 punctate nonobstructing right-sided renal stones with dominant stone measuring approximately 2 mm in diameter (axial image 15, series 7) and approximately 7 punctate nonobstructing left-sided renal stones with dominant stone measuring approximately 2 mm (image 14, series 7). No urinary obstruction or perinephric stranding. Note is made of an approximately 1.4 cm hypoattenuating nonenhancing left-sided renal cyst. No discrete right-sided renal lesions. Normal appearance of the bilateral adrenal glands. There is mild thickening the urinary bladder cough potentially accentuated due to underdistention. Stomach/Bowel: Moderate colonic stool burden without evidence of enteric obstruction. Normal appearance of the terminal ileum and appendix. No pneumoperitoneum, pneumatosis or portal venous gas. Lymphatic:  Scattered porta hepatis, retroperitoneal, mesenteric and pelvic lymph nodes are numerous though majority are not enlarged by size criteria and appear morphologically similar to the 12/2015 examination. Index right external iliac chain lymph node measures 1 cm in greatest short axis diameter (image 166, series 8, unchanged compared to the 12/2015 examination. No worsening bulky retroperitoneal, mesenteric, pelvic or inguinal lymphadenopathy. Reproductive: Dystrophic calcifications within normal sized prostate gland. Small amount of fluid is seen within the pelvic cul-de-sac. Other: Mild diffuse body wall anasarca, most conspicuous about the midline of low back. Musculoskeletal: No acute or aggressive osseous abnormalities. Right-sided L4 pars defects with associated grade 1 anterolisthesis of L4 upon L5 measuring approximately 0.8 cm in diameter, similar to the 12/2015 examination. IMPRESSION: VASCULAR 1. Post endovascular repair of suprarenal abdominal aortic aneurysm without evidence of complication. Compared to the 12/2015 examination, there has been near complete collapse of the native abdominal aortic aneurysm. Aortic aneurysm NOS (ICD10-I71.9). 2. Bilateral renal arterial stents appear widely patent. 3. Suspected hemodynamically significant stenosis involving the origin of the common hepatic artery, similar to the 12/2015 examination. 4. Potential hemodynamically significant narrowing involving the proximal aspect of the right SFA. 5.  Aortic Atherosclerosis (ICD10-I70.0). NON-VASCULAR 1. No definite explanation for patient's right lower quadrant abdominal pain. Specifically, no evidence of enteric or urinary obstruction. Normal appearance of the appendix. 2. Findings of hepatic cirrhosis and portal venous hypertension with several varices about the gastroesophageal junction, splenomegaly and trace amount of intra-abdominal ascites. No discrete hyperenhancing hepatic lesions. 3. Bilateral punctate (2 mm)  nonobstructing nephrolithiasis. Electronically Signed  By: Sandi Mariscal M.D.   On: 12/28/2017 12:52     Assessment/Plan 1. Abdominal aortic aneurysm (AAA) without rupture (HCC) Recommend: Patient is status post successful endovascular repair of the AAA.   No further intervention is required at this time.   No endoleak is detected and the aneurysm sac is stable.  The patient will continue antiplatelet therapy as prescribed as well as aggressive management of hyperlipidemia. Exercise is again strongly encouraged.   However, endografts require continued surveillance with ultrasound or CT scan. This is mandatory to detect any changes that allow repressurization of the aneurysm sac.  The patient is informed that this would be asymptomatic.  The patient is reminded that lifelong routine surveillance is a necessity with an endograft. Patient will continue to follow-up at 12 month intervals with ultrasound of the aorta.  - VAS US AORTA/IVC/ILIACS; Future  2. Aortic atherosclerosis (HCC)  Recommend:  The patient has evidence of atherosclerosis of the lower extremities with claudication.  The patient does not voice lifestyle limiting changes at this point in time.  Noninvasive studies do not suggest clinically significant change.  No invasive studies, angiography or surgery at this time The patient should continue walking and begin a more formal exercise program.  The patient should continue antiplatelet therapy and aggressive treatment of the lipid abnormalities  No changes in the patient's medications at this time  The patient should continue wearing graduated compression socks 10-15 mmHg strength to control the mild edema.    3. Essential hypertension Continue antihypertensive medications as already ordered, these medications have been reviewed and there are no changes at this time.   4. Atherosclerosis of native coronary artery of native heart without angina pectoris Continue  cardiac and antihypertensive medications as already ordered and reviewed, no changes at this time.  Continue statin as ordered and reviewed, no changes at this time  Nitrates PRN for chest pain   5. Chronic obstructive pulmonary disease, unspecified COPD type ([place]) Continue pulmonary medications and aerosols as already ordered, these medications have been reviewed and there are no changes at this time.      Hortencia Pilar, MD  01/03/2018 9:43 AM

## 2018-01-03 NOTE — Telephone Encounter (Signed)
Copied from [place] [id number]. Topic: Referral - Request >> Jan 03, 2018 12:11 PM Scherrie Gerlach wrote: Reason for CRM: pt wants a referral to a GI doctor in [place] for his ongoing stomach issues, Asap, first available

## 2018-01-04 ENCOUNTER — Encounter: Payer: Self-pay | Admitting: Gastroenterology

## 2018-01-04 ENCOUNTER — Encounter (INDEPENDENT_AMBULATORY_CARE_PROVIDER_SITE_OTHER): Payer: Self-pay | Admitting: Vascular Surgery

## 2018-01-04 ENCOUNTER — Encounter: Payer: Self-pay | Admitting: Internal Medicine

## 2018-01-05 ENCOUNTER — Other Ambulatory Visit: Payer: Self-pay | Admitting: Family Medicine

## 2018-01-07 NOTE — Telephone Encounter (Signed)
[facility] Last filled:  11/15/17, #30 Last OV (CPE):  11/15/17 Next OV:  none

## 2018-01-24 ENCOUNTER — Ambulatory Visit: Payer: Medicare PPO | Admitting: Family Medicine

## 2018-01-28 ENCOUNTER — Encounter: Payer: Self-pay | Admitting: Family Medicine

## 2018-01-28 ENCOUNTER — Ambulatory Visit: Payer: Medicare PPO | Admitting: Family Medicine

## 2018-01-28 VITALS — BP 118/62 | HR 65 | Temp 98.2°F | Ht 70.0 in | Wt 263.5 lb

## 2018-01-28 DIAGNOSIS — M1611 Unilateral primary osteoarthritis, right hip: Secondary | ICD-10-CM | POA: Diagnosis not present

## 2018-01-28 DIAGNOSIS — F17200 Nicotine dependence, unspecified, uncomplicated: Secondary | ICD-10-CM

## 2018-01-28 MED ORDER — NICOTINE 7 MG/24HR TD PT24: 7.0000 mg | MEDICATED_PATCH | Freq: Every day | TRANSDERMAL | 0 refills | Status: DC

## 2018-01-28 MED ORDER — NICOTINE 14 MG/24HR TD PT24: 14.0000 mg | MEDICATED_PATCH | Freq: Every day | TRANSDERMAL | 0 refills | Status: DC

## 2018-01-28 MED ORDER — ONDANSETRON 4 MG PO TBDP: 4.0000 mg | ORAL_TABLET | Freq: Three times a day (TID) | ORAL | 3 refills | Status: DC | PRN

## 2018-01-28 NOTE — Progress Notes (Signed)
BP 118/62 (BP Location: Left Arm, Patient Position: Sitting, Cuff Size: Large)   Pulse 65   Temp 98.2 F (36.8 C) (Oral)   Ht 5' 10"  (1.778 m)   Wt 263 lb 8 oz (119.5 kg)   SpO2 96%   BMI 37.81 kg/m    CC: discuss smoking cessation Subjective:    Patient ID: [name], male    DOB: 1955/08/13, 62 y.o.   MRN: [id number]  HPI: DAUNE COLGATE is a 62 y.o. male presenting on 01/28/2018 for Nicotine Dependence (Pt is requesting med to stop smoking due to upcomind right hip replacement surgery. Told he has to quit smoking and has to lose down to 250lbs to have surgery. Pt accompanied by his wife.)   Planning upcoming hip replacement (Dr Jefferson Fuel at [facility]) - needs to quit smoking and lose weight down to 250 lbs prior to undergoing surgery.   Wants to quit smoking - interested in patches. Currently 1 ppd. He has quit cold [place] in the past. chantix caused too vivid dreams, wellbutrin worsened mood. Declines quitline info or [place] website resources.   Recent [facility] eval - stable report.  Upcoming GI appt next month.  Upcoming appt with ID next month.   Requests zofran refilled.   Relevant past medical, surgical, family and social history reviewed and updated as indicated. Interim medical history since our last visit reviewed. Allergies and medications reviewed and updated. Outpatient Medications Prior to Visit  Medication Sig Dispense Refill  . albuterol (PROVENTIL HFA;VENTOLIN HFA) 108 (90 Base) MCG/ACT inhaler Inhale 2 puffs into the lungs every 6 (six) hours as needed for wheezing or shortness of breath. 1 Inhaler 0  . albuterol (PROVENTIL) (2.5 MG/3ML) 0.083% nebulizer solution USE 1 VIAL PER NEBULIZER EVERY 6 HRS AS NEEDED FOR WHEEZING 75 mL 6  . aspirin 325 MG tablet Take 1 tablet (325 mg total) by mouth daily. 90 tablet 3  . B Complex-C (SUPER B COMPLEX PO) Take 1 tablet by mouth daily.    . chlorhexidine (HIBICLENS) 4 % external liquid Apply topically daily as needed. 473 mL  0  . cholecalciferol 2000 units TABS Take 2,000 Units by mouth daily.    [name] dicyclomine (BENTYL) 20 MG tablet TAKE 1 TABLET BY MOUTH 3 TIMES DAILY BEFORE MEALS. AS NEEDED FOR ABDOMINAL PAIN 30 tablet 0  . DULoxetine (CYMBALTA) 60 MG capsule Take 1 capsule (60 mg total) by mouth daily. 30 capsule 11  . fentaNYL (DURAGESIC - DOSED MCG/HR) 50 MCG/HR Place 1 patch (50 mcg total) every other day onto the skin. 15 patch 0  . fentaNYL (DURAGESIC) 25 MCG/HR patch Place 1 patch (25 mcg total) onto the skin every other day. Alternating with 39mg patch 7 patch 0  . fluconazole (DIFLUCAN) 100 MG tablet TAKE 1 TABLET BY MOUTH DAILY 14 tablet 2  . fluticasone (FLONASE) 50 MCG/ACT nasal spray Place 2 sprays into both nostrils daily as needed for allergies.     . folic acid (FOLVITE) 1 MG tablet TAKE 1 TABLET BY MOUTH DAILY 30 tablet 6  . irbesartan (AVAPRO) 75 MG tablet Take 0.5 tablets (37.5 mg total) by mouth daily. 15 tablet 11  . loperamide (IMODIUM) 2 MG capsule Take 1 capsule (2 mg total) by mouth 4 (four) times daily as needed for diarrhea or loose stools. 12 capsule 0  . metoprolol succinate (TOPROL-XL) 25 MG 24 hr tablet Take 0.5 tablets (12.5 mg total) by mouth daily. 15 tablet 11  . Misc Natural  Products (TART CHERRY ADVANCED) CAPS Take 1 capsule by mouth 2 (two) times daily.    . nitroGLYCERIN (NITROSTAT) 0.4 MG SL tablet Place 1 tablet (0.4 mg total) under the tongue every 5 (five) minutes as needed for chest pain. 25 tablet 12  . nystatin (MYCOSTATIN/NYSTOP) powder Apply topically 2 (two) times daily. 60 g 3  . omeprazole (PRILOSEC) 40 MG capsule Take 1 capsule (40 mg total) by mouth daily. 30 capsule 11  . oxyCODONE (ROXICODONE) 15 MG immediate release tablet Take 1 tablet (15 mg total) every 4 (four) hours as needed by mouth. 180 tablet 0  . polyethylene glycol powder (GLYCOLAX/MIRALAX) powder Take 17 g by mouth daily as needed for moderate constipation. 3350 g 0  . Red Yeast Rice 600 MG CAPS Take  2 capsules by mouth daily.    . traZODone (DESYREL) 50 MG tablet Take 1 tablet (50 mg total) by mouth at bedtime as needed for sleep. 30 tablet 3  . vitamin B-12 (CYANOCOBALAMIN) 500 MCG tablet Take by mouth.    . ondansetron (ZOFRAN-ODT) 4 MG disintegrating tablet TAKE 1-2 TABLETS BY MOUTH EVERY 8 HOURS AS NEEDED FOR NAUSEA OR VOMITING. DAY SUPPLY PER INS. 20 tablet 0  . Celery Seed OIL Take by mouth.     No facility-administered medications prior to visit.      Per HPI unless specifically indicated in ROS section below Review of Systems     Objective:    BP 118/62 (BP Location: Left Arm, Patient Position: Sitting, Cuff Size: Large)   Pulse 65   Temp 98.2 F (36.8 C) (Oral)   Ht 5' 10"  (1.778 m)   Wt 263 lb 8 oz (119.5 kg)   SpO2 96%   BMI 37.81 kg/m   Wt Readings from Last 3 Encounters:  01/28/18 263 lb 8 oz (119.5 kg)  01/03/18 265 lb (120.2 kg)  12/28/17 256 lb (116.1 kg)    Physical Exam  Constitutional: He appears well-developed and well-nourished. He appears distressed (pain with ambulation).  Uses cane  Nursing note and vitals reviewed.  Results for orders placed or performed during the hospital encounter of 12/28/17  I-STAT creatinine  Result Value Ref Range   Creatinine, Ser 0.80 0.61 - 1.24 mg/dL      Assessment & Plan:   Problem List Items Addressed This Visit    Smoker - Primary    Action phase - wants to try patches. Discussed use. Start at 58m patch x 2 wks then decrease to 767mpatch for 2 wks then stop. Handout provided.       Osteoarthritis of right hip    End stage. Affecting activity. Hopeful for upcoming surgery, goal <250lb and quit smoking prior.           Meds ordered this encounter  Medications  . ondansetron (ZOFRAN-ODT) 4 MG disintegrating tablet    Sig: Take 1 tablet (4 mg total) by mouth every 8 (eight) hours as needed for nausea or vomiting. Day supply per insurance.    Dispense:  20 tablet    Refill:  3    NEEDS REFILLS  .  nicotine (NICODERM CQ) 14 mg/24hr patch    Sig: Place 1 patch (14 mg total) onto the skin daily.    Dispense:  28 patch    Refill:  0  . nicotine (NICODERM CQ) 7 mg/24hr patch    Sig: Place 1 patch (7 mg total) onto the skin daily.    Dispense:  28 patch  Refill:  0   No orders of the defined types were placed in this encounter.   Follow up plan: Return if symptoms worsen or fail to improve.  Ria Bush, MD

## 2018-01-28 NOTE — Assessment & Plan Note (Addendum)
Action phase - wants to try patches. Discussed use. Start at 8m patch x 2 wks then decrease to 764mpatch for 2 wks then stop. Handout provided.

## 2018-01-28 NOTE — Patient Instructions (Addendum)
Try nicotine patches - 14 mcg patch daily for 2 weeks then decrease to 46mg patch.   Steps to Quit Smoking Smoking tobacco can be harmful to your health and can affect almost every organ in your body. Smoking puts you, and those around you, at risk for developing many serious chronic diseases. Quitting smoking is difficult, but it is one of the best things that you can do for your health. It is never too late to quit. What are the benefits of quitting smoking? When you quit smoking, you lower your risk of developing serious diseases and conditions, such as:  Lung cancer or lung disease, such as COPD.  Heart disease.  Stroke.  Heart attack.  Infertility.  Osteoporosis and bone fractures.  Additionally, symptoms such as coughing, wheezing, and shortness of breath may get better when you quit. You may also find that you get sick less often because your body is stronger at fighting off colds and infections. If you are pregnant, quitting smoking can help to reduce your chances of having a baby of low birth weight. How do I get ready to quit? When you decide to quit smoking, create a plan to make sure that you are successful. Before you quit:  Pick a date to quit. Set a date within the next two weeks to give you time to prepare.  Write down the reasons why you are quitting. Keep this list in places where you will see it often, such as on your bathroom mirror or in your car or wallet.  Identify the people, places, things, and activities that make you want to smoke (triggers) and avoid them. Make sure to take these actions: ? Throw away all cigarettes at home, at work, and in your car. ? Throw away smoking accessories, such as aScientist, research (medical) ? Clean your car and make sure to empty the ashtray. ? Clean your home, including curtains and carpets.  Tell your family, friends, and coworkers that you are quitting. Support from your loved ones can make quitting easier.  Talk with your  health care provider about your options for quitting smoking.  Find out what treatment options are covered by your health insurance.  What strategies can I use to quit smoking? Talk with your healthcare provider about different strategies to quit smoking. Some strategies include:  Quitting smoking altogether instead of gradually lessening how much you smoke over a period of time. Research shows that quitting "cold [place] is more successful than gradually quitting.  Attending in-person counseling to help you build problem-solving skills. You are more likely to have success in quitting if you attend several counseling sessions. Even short sessions of 10 minutes can be effective.  Finding resources and support systems that can help you to quit smoking and remain smoke-free after you quit. These resources are most helpful when you use them often. They can include: ? Online chats with a cSocial worker ? Telephone quitlines. ? PCareers information officer ? Support groups or group counseling. ? Text messaging programs. ? Mobile phone applications.  Taking medicines to help you quit smoking. (If you are pregnant or breastfeeding, talk with your health care provider first.) Some medicines contain nicotine and some do not. Both types of medicines help with cravings, but the medicines that include nicotine help to relieve withdrawal symptoms. Your health care provider may recommend: ? Nicotine patches, gum, or lozenges. ? Nicotine inhalers or sprays. ? Non-nicotine medicine that is taken by mouth.  Talk with your health care provider about  combining strategies, such as taking medicines while you are also receiving in-person counseling. Using these two strategies together makes you more likely to succeed in quitting than if you used either strategy on its own. If you are pregnant or breastfeeding, talk with your health care provider about finding counseling or other support strategies to quit smoking. Do  not take medicine to help you quit smoking unless told to do so by your health care provider. What things can I do to make it easier to quit? Quitting smoking might feel overwhelming at first, but there is a lot that you can do to make it easier. Take these important actions:  Reach out to your family and friends and ask that they support and encourage you during this time. Call telephone quitlines, reach out to support groups, or work with a counselor for support.  Ask people who smoke to avoid smoking around you.  Avoid places that trigger you to smoke, such as bars, parties, or smoke-break areas at work.  Spend time around people who do not smoke.  Lessen stress in your life, because stress can be a smoking trigger for some people. To lessen stress, try: ? Exercising regularly. ? Deep-breathing exercises. ? Yoga. ? Meditating. ? Performing a body scan. This involves closing your eyes, scanning your body from head to toe, and noticing which parts of your body are particularly tense. Purposefully relax the muscles in those areas.  Download or purchase mobile phone or tablet apps (applications) that can help you stick to your quit plan by providing reminders, tips, and encouragement. There are many free apps, such as QuitGuide from the [employer] Office manager for Disease Control and Prevention). You can find other support for quitting smoking (smoking cessation) through smokefree.gov and other websites.  How will I feel when I quit smoking? Within the first 24 hours of quitting smoking, you may start to feel some withdrawal symptoms. These symptoms are usually most noticeable 2-3 days after quitting, but they usually do not last beyond 2-3 weeks. Changes or symptoms that you might experience include:  Mood swings.  Restlessness, anxiety, or irritation.  Difficulty concentrating.  Dizziness.  Strong cravings for sugary foods in addition to nicotine.  Mild weight  gain.  Constipation.  Nausea.  Coughing or a sore throat.  Changes in how your medicines work in your body.  A depressed mood.  Difficulty sleeping (insomnia).  After the first 2-3 weeks of quitting, you may start to notice more positive results, such as:  Improved sense of smell and taste.  Decreased coughing and sore throat.  Slower heart rate.  Lower blood pressure.  Clearer skin.  The ability to breathe more easily.  Fewer sick days.  Quitting smoking is very challenging for most people. Do not get discouraged if you are not successful the first time. Some people need to make many attempts to quit before they achieve long-term success. Do your best to stick to your quit plan, and talk with your health care provider if you have any questions or concerns. This information is not intended to replace advice given to you by your health care provider. Make sure you discuss any questions you have with your health care provider. Document Released: 05/16/2001 Document Revised: 01/18/2016 Document Reviewed: 10/06/2014 Elsevier Interactive Patient Education  [employer].

## 2018-01-28 NOTE — Assessment & Plan Note (Signed)
End stage. Affecting activity. Hopeful for upcoming surgery, goal <250lb and quit smoking prior.

## 2018-01-31 DIAGNOSIS — Z951 Presence of aortocoronary bypass graft: Secondary | ICD-10-CM | POA: Diagnosis not present

## 2018-02-04 ENCOUNTER — Other Ambulatory Visit: Payer: Self-pay | Admitting: Family Medicine

## 2018-02-13 ENCOUNTER — Ambulatory Visit (INDEPENDENT_AMBULATORY_CARE_PROVIDER_SITE_OTHER): Payer: Medicare PPO | Admitting: Gastroenterology

## 2018-02-13 ENCOUNTER — Encounter: Payer: Self-pay | Admitting: Gastroenterology

## 2018-02-13 ENCOUNTER — Other Ambulatory Visit: Payer: Self-pay

## 2018-02-13 VITALS — BP 117/77 | HR 90 | Ht 70.0 in | Wt 264.0 lb

## 2018-02-13 DIAGNOSIS — K766 Portal hypertension: Secondary | ICD-10-CM

## 2018-02-13 DIAGNOSIS — K7460 Unspecified cirrhosis of liver: Secondary | ICD-10-CM

## 2018-02-13 DIAGNOSIS — K3189 Other diseases of stomach and duodenum: Secondary | ICD-10-CM | POA: Diagnosis not present

## 2018-02-13 DIAGNOSIS — R1310 Dysphagia, unspecified: Secondary | ICD-10-CM

## 2018-02-13 NOTE — Progress Notes (Signed)
Miguel Bellows MD, MRCP(U.K) [address]  [place]  [place], [place] [address]  Main: [phone]  Fax: [phone]   Gastroenterology Consultation  Referring Provider:     Ria Bush, MD Primary Care Physician:  Ria Bush, MD Primary Gastroenterologist:  Dr. Jonathon Ware  Reason for Consultation:     Liver cirrhosis         HPI:   [name] is a 62 y.o. y/o male referred for consultation & management  by Dr. Ria Bush, MD.     He has been referred for cirrhosis of the liver. He has been seen by [facility] in the past. Last office visit was in 10/04/16. His Cirrhosis had been attriuted to NASH. P;an at that visit was to obtain labs and set up for an EGD to screen for varices.    EGD 01/2017 -PHG, mild stricture was dilated.  RUQ USG 11/2017 : Small ascites, cirrhosis, Chronic dilation of CBD, mild dilation of PD, splenomegaly  CT angio of the abdomen 12/2017:Post endovascular repair of suprarenal abdominal aortic aneurysm without evidence of complication .Suspected hemodynamically significant stenosis involving the origin of the common hepatic artery, similar to the 12/2015 examination.Potential hemodynamically significant narrowing involving the proximal aspect of the right SFA.Findings of hepatic cirrhosis and portal venous hypertension with several varices about the gastroesophageal junction, splenomegaly and trace amount of intra-abdominal ascites. No discrete hyperenhancing hepatic lesions  Labs :  11/2017: B12,normal ,albumin 3.1,HB 12.5 and MCV 100,platelets 70   Doing well , He says he has had hepatitis B in the past- approximately 20 years back , says he has not been treated for the same . No tatoos, no alcohol , no military service, no blood transfusions. Used cocaine in his 20's, none recently. Weighed 370 lbs at his heaviest. Was always " big", he is not diabetic.   Says he he can start eating, food gets stuck in his throat , usually  solids,the last dilation he had did not help. He takes prilosec after his meals.   Is being planned for hip surgery .   On long term Naprosyn for hip pain .    Past Medical History:  Diagnosis Date  . AAA (abdominal aortic aneurysm) ([place]) 09/2012--  monitored by dr Trula Slade   stable 5.6cm CTA abdomen 2016  . Abnormal drug screen 07/09/2016   1/2/018 - positive oxycodone, fentanyl, inapprop positive MJ - mod risk  . Allergic rhinitis   . B12 deficiency   . CAD (coronary artery disease) cardiologist-  dr Stanford Breed   x3 with stents last 2005, EF 40%, predominantly RCA by CT 2016  . Cataracts, bilateral   . Cervical spondylosis 05/2010   s/p surgery  . Charcot Marie Tooth muscular atrophy dx  [phone]   neurologist--  dr love--  type 2 per pt  . Chronic pain syndrome    [facility] with [facility] --> disagreement and transfered care to Dr Sanjuan Dame at [facility], requests PCP write Rx but f/u with pain clinic Q6-12 months  . COPD (chronic obstructive pulmonary disease) ([place]) 10/2011   minimal by PFTs  . DDD (degenerative disc disease)   . Disturbances of sensation of smell and taste    improving  . Dyspnea on exertion   . GERD (gastroesophageal reflux disease)   . Gout   . Headache   . Hepatitis    hepatitis B  . Hidradenitis    right groin  . Hidradenitis suppurativa dx 2011  goin and leg crease   followd by Lyndle Herrlich - daily bactrim, s/p intralesional steroid injection 10/2010  . Hip osteoarthritis    s/p intraarticular steroid shot (12/2012) ([place])  . History of hepatitis B 1983  . History of MI (myocardial infarction)    2000  &  2005  . History of pneumonia   . History of viral meningitis 2000  . HLD (hyperlipidemia)   . HTN (hypertension)   . Ischemic cardiomyopathy    s/p inferior MI  --  current ef per myoview 39%  . Liver cirrhosis secondary to NASH ([place]) 01/2014   by CT scan, rec virtual colonoscopy by Dr Collene Mares 06/2014  .  Lumbar herniated disc   . Myocardial infarction ([place])    x2  . Nocturia more than twice per night   . Obesity   . Spinal stenosis    released from [facility].  established with [facility] (07/2013)  . T2DM (type 2 diabetes mellitus) ([place])    ABIs WNL 2016  . Vitamin D deficiency     Past Surgical History:  Procedure Laterality Date  . ABDOMINAL AORTIC ENDOVASCULAR FENESTRATED STENT GRAFT N/A 11/30/2015   Procedure: ABDOMINAL AORTIC ENDOVASCULAR FENESTRATED STENT GRAFT;  Surgeon: Serafina Mitchell, MD;  Location: [facility];  Service: Vascular;  Laterality: N/A;  . ANTERIOR CERVICAL DECOMP/DISCECTOMY FUSION  01-07-2010    C4 -- C7  . CARDIAC CATHETERIZATION  03-30-2005  dr Albertine Patricia   ef 40% w/ inferior akinesis/  LM and CFX angiographically normal/  pLAD 30%/   Widely patent stents in RCA and PDA widely patent  . CARDIOVASCULAR STRESS TEST  10-23-2012  dr Stanford Breed   No ischemia/  Moderate scar in the inferior wall, otherwise normal perfusion/  LV ef 39%,  LV wall motion: inferior/ inferolateral hypokinesis  . COLONOSCOPY  05/06/2007   normal, small int hemorrhoids rpt 5 yrs due to fmhx - rec against rpt colonoscopy by Dr Collene Mares  . CORONARY ANGIOPLASTY  2000  dr Stanford Breed   PCI to RCA and PDA  . CORONARY ANGIOPLASTY WITH STENT PLACEMENT  03-19-2005  dr Gwyndolyn Saxon downey   inferior STEMI--- DES x4 to RCA w/ balloon angioplasty and balloon angioplasty to jailed PDA ostium/  severe hypokinesis of midinferor wall, ef 50%/  dLM 20%,  mLAD 20%,  dCFX 60%  . ESOPHAGOGASTRODUODENOSCOPY  01/2017   dilated benign esophageal stenosis, portal hypertensive gastropathy Henrene Pastor)  . HYDRADENITIS EXCISION Right 12/31/2014   Procedure: WIDE EXCISION HIDRADENITIS GROIN; Coralie Keens, MD  . LUMBAR DISC SURGERY     L5-S1  . LUMBAR LAMINECTOMY  05-18-2010   L2--5   laminectomy/foraminotomy for stenosis Joya Salm)  . MYELOGRAM     L5-S1 and L1-2 spondylosis  . SACROILIAC JOINT INJECTION Bilateral 10/2013   [place]  .  TONSILLECTOMY AND ADENOIDECTOMY  1972    Prior to Admission medications   Medication Sig Start Date End Date Taking? Authorizing Provider  albuterol (PROVENTIL HFA;VENTOLIN HFA) 108 (90 Base) MCG/ACT inhaler Inhale 2 puffs into the lungs every 6 (six) hours as needed for wheezing or shortness of breath. 08/17/15   Jearld Fenton, NP  albuterol (PROVENTIL) (2.5 MG/3ML) 0.083% nebulizer solution USE 1 VIAL PER NEBULIZER EVERY 6 HRS AS NEEDED FOR WHEEZING 07/19/15   Ria Bush, MD  aspirin 325 MG tablet Take 1 tablet (325 mg total) by mouth daily. 07/19/15   Ria Bush, MD  B Complex-C (SUPER B COMPLEX PO) Take 1 tablet by mouth daily.    [provider]  chlorhexidine (HIBICLENS) 4 % external liquid Apply topically daily as needed. 02/01/17   Ria Bush, MD  cholecalciferol 2000 units TABS Take 2,000 Units by mouth daily. 04/29/16   Ria Bush, MD  dicyclomine (BENTYL) 20 MG tablet TAKE 1 TABLET BY MOUTH 3 TIMES DAILY BEFORE MEALS. AS NEEDED FOR ABDOMINAL PAIN 01/08/18   Ria Bush, MD  DULoxetine (CYMBALTA) 60 MG capsule Take 1 capsule (60 mg total) by mouth daily. 11/15/17   Ria Bush, MD  fentaNYL (DURAGESIC - DOSED MCG/HR) 50 MCG/HR Place 1 patch (50 mcg total) every other day onto the skin. 04/19/17   Ria Bush, MD  fentaNYL (DURAGESIC) 25 MCG/HR patch Place 1 patch (25 mcg total) onto the skin every other day. Alternating with 33mg patch 03/13/17   GRia Bush MD  fluconazole (DIFLUCAN) 100 MG tablet TAKE 1 TABLET BY MOUTH DAILY 10/09/17   CMichel Bickers MD  fluticasone ([facility] 50 MCG/ACT nasal spray Place 2 sprays into both nostrils daily as needed for allergies.  02/09/12   GRia Bush MD  folic acid (FOLVITE) 1 MG tablet TAKE 1 TABLET BY MOUTH DAILY 11/19/17   GRia Bush MD  irbesartan (AVAPRO) 75 MG tablet Take 0.5 tablets (37.5 mg total) by mouth daily. 11/15/17   GRia Bush MD  loperamide (IMODIUM) 2 MG  capsule Take 1 capsule (2 mg total) by mouth 4 (four) times daily as needed for diarrhea or loose stools. 06/12/16   Ward, KDelice Bison DO  metoprolol succinate (TOPROL-XL) 25 MG 24 hr tablet TAKE 1/2 TABLET BY MOUTH ONCE DAILY. 02/06/18   GRia Bush MD  Misc Natural Products ([facility]) CAPS Take 1 capsule by mouth 2 (two) times daily.    [provider]  nicotine (NICODERM CQ) 14 mg/24hr patch Place 1 patch (14 mg total) onto the skin daily. 01/28/18   GRia Bush MD  nicotine (NICODERM CQ) 7 mg/24hr patch Place 1 patch (7 mg total) onto the skin daily. 01/28/18   GRia Bush MD  nitroGLYCERIN (NITROSTAT) 0.4 MG SL tablet Place 1 tablet (0.4 mg total) under the tongue every 5 (five) minutes as needed for chest pain. 10/10/12   CLelon Perla MD  nystatin (MYCOSTATIN/NYSTOP) powder Apply topically 2 (two) times daily. 06/26/17   CMichel Bickers MD  omeprazole (PRILOSEC) 40 MG capsule Take 1 capsule (40 mg total) by mouth daily. 11/15/17   GRia Bush MD  ondansetron (ZOFRAN-ODT) 4 MG disintegrating tablet Take 1 tablet (4 mg total) by mouth every 8 (eight) hours as needed for nausea or vomiting. Day supply per insurance. 01/28/18   GRia Bush MD  oxyCODONE (ROXICODONE) 15 MG immediate release tablet Take 1 tablet (15 mg total) every 4 (four) hours as needed by mouth. 04/19/17   GRia Bush MD  polyethylene glycol powder (GLYCOLAX/MIRALAX) powder Take 17 g by mouth daily as needed for moderate constipation. 01/15/14   GRia Bush MD  Red Yeast Rice 600 MG CAPS Take 2 capsules by mouth daily.    [provider]  traZODone (DESYREL) 50 MG tablet Take 1 tablet (50 mg total) by mouth at bedtime as needed for sleep. 11/21/17   GRia Bush MD  vitamin B-12 (CYANOCOBALAMIN) 500 MCG tablet Take by mouth. 11/19/17   [provider]    Family History  Problem Relation Age of Onset  . Cancer Mother        colon  . Diabetes  Mother   . Kidney disease Mother   . Aneurysm  Mother        AAA  . Rheum arthritis Mother   . Charcot-Marie-Tooth disease Mother   . Heart disease Mother        before age 64  . Cancer Father        skin  . Heart attack Father   . Heart disease Father        before age 74  . Cancer Brother        skin  . Coronary artery disease Brother   . Cancer Brother        small cell lung cancer  . Aneurysm Brother        AAA  . Rheum arthritis Sister   . Rheum arthritis Brother   . Prostate cancer Neg Hx   . Bladder Cancer Neg Hx   . Kidney cancer Neg Hx      Social History   Tobacco Use  . Smoking status: Current Every Day Smoker    Packs/day: 1.00    Years: 57.00    Pack years: 57.00    Types: Cigarettes    Start date: 06/06/1967  . Smokeless tobacco: Never Used  . Tobacco comment: stopped smoking a pipe in 2015  DOES SMOKE E CIG  Substance Use Topics  . Alcohol use: No    Alcohol/week: 0.0 standard drinks  . Drug use: Yes    Types: Fentanyl    Allergies as of 02/13/2018 - Review Complete 01/28/2018  Allergen Reaction Noted  . Statins Shortness Of Breath 01/22/2012  . Losartan Other (See Comments) 01/30/2013  . Tramadol Nausea Only 03/13/2017  . Allopurinol Nausea Only 12/18/2012  . Baclofen Nausea And Vomiting 06/23/2015  . Gabapentin Nausea And Vomiting 04/21/2015    Review of Systems:    All systems reviewed and negative except where noted in HPI.   Physical Exam:  There were no vitals taken for this visit. No LMP for male patient. Psych:  Alert and cooperative. Normal mood and affect.In a wheel chair  General:   Alert,  Well-developed, well-nourished, pleasant and cooperative in NAD Head:  Normocephalic and atraumatic. Eyes:  Sclera clear, no icterus.   Conjunctiva pink. Ears:  Normal auditory acuity. Nose:  No deformity, discharge, or lesions. Mouth:  No deformity or lesions,oropharynx pink & moist. Neck:  Supple; no masses or thyromegaly. Lungs:   Respirations even and unlabored.  Clear throughout to auscultation.   No wheezes, crackles, or rhonchi. No acute distress. Heart:  Regular rate and rhythm; no murmurs, clicks, rubs, or gallops. Abdomen:  Normal bowel sounds.  No bruits.  Soft, non-tender and non-distended without masses, hepatosplenomegaly or hernias noted.  No guarding or rebound tenderness.    Extremities:  No clubbing or edema.  No cyanosis. Neurologic:  Alert and oriented x3;  grossly normal neurologically. Skin:  Ecchymotic patches over arms  Lymph Nodes:  No significant cervical adenopathy. Psych:  Alert and cooperative. Normal mood and affect.  Imaging Studies: No results found.  Assessment and Plan:   [name] is a 62 y.o. y/o male has been referred for  liver cirrhosis. In the past been felt by his last GI in 2018 was probably NASH related, autoimmune work up not performed . Has some issues with swallowing, EOE not resolved previously  Plan  1. Check Hep A/B/C serologies, autoimmune workup  2. MRI liver and pancreas in 05/2018 to screen for [facility] and evaluate dilated PD and CBD 3. Low salt diet  4. EGD to r/o EOE  i 5. Labs to calculate MELD score  6. Stop smoking/vaping   7. Stop Naprosyn  8. Prilosec first thing in the morning on an empty stomach    I have discussed alternative options, risks & benefits,  which include, but are not limited to, bleeding, infection, perforation,respiratory complication & drug reaction.  The patient agrees with this plan & written consent will be obtained.     Follow up in 4 weeks   Dr Miguel Bellows MD,MRCP(U.K)

## 2018-02-13 NOTE — Patient Instructions (Signed)

## 2018-02-14 LAB — BASIC METABOLIC PANEL
BUN / CREAT RATIO: 16 (ref 10–24)
BUN: 16 mg/dL (ref 8–27)
CALCIUM: 8.7 mg/dL (ref 8.6–10.2)
CO2: 24 mmol/L (ref 20–29)
CREATININE: 1.03 mg/dL (ref 0.76–1.27)
Chloride: 103 mmol/L (ref 96–106)
GFR calc non Af Amer: 77 mL/min/{1.73_m2} (ref >59)
GFR, EST AFRICAN AMERICAN: 90 mL/min/{1.73_m2} (ref >59)
Glucose: 135 mg/dL — ABNORMAL HIGH (ref 65–99)
Potassium: 3.9 mmol/L (ref 3.5–5.2)
Sodium: 140 mmol/L (ref 134–144)

## 2018-02-16 LAB — IMMUNOGLOBULINS A/E/G/M, SERUM
IGM (IMMUNOGLOBULIN M), SRM: 75 mg/dL (ref 20–172)
IgA/Immunoglobulin A, Serum: 931 mg/dL — ABNORMAL HIGH (ref 61–437)
IgE (Immunoglobulin E), Serum: 695 IU/mL — ABNORMAL HIGH (ref 6–495)
IgG (Immunoglobin G), Serum: 2243 mg/dL — ABNORMAL HIGH (ref 700–1600)

## 2018-02-16 LAB — HEPATITIS C ANTIBODY: Hep C Virus Ab: <0.1 s/co ratio (ref 0.0–0.9)

## 2018-02-16 LAB — ANA: ANA: NEGATIVE

## 2018-02-16 LAB — PROTIME-INR
INR: 1.2 (ref 0.8–1.2)
Prothrombin Time: 12.5 s — ABNORMAL HIGH (ref 9.1–12.0)

## 2018-02-16 LAB — CBC WITH DIFFERENTIAL/PLATELET
Basophils Absolute: 0 10*3/uL (ref 0.0–0.2)
Basos: 1 %
EOS (ABSOLUTE): 0.1 10*3/uL (ref 0.0–0.4)
Eos: 3 %
Hematocrit: 34.5 % — ABNORMAL LOW (ref 37.5–51.0)
Hemoglobin: 11.6 g/dL — ABNORMAL LOW (ref 13.0–17.7)
IMMATURE GRANS (ABS): 0 10*3/uL (ref 0.0–0.1)
IMMATURE GRANULOCYTES: 0 %
LYMPHS: 22 %
Lymphocytes Absolute: 0.8 10*3/uL (ref 0.7–3.1)
MCH: 33.1 pg — ABNORMAL HIGH (ref 26.6–33.0)
MCHC: 33.6 g/dL (ref 31.5–35.7)
MCV: 99 fL — ABNORMAL HIGH (ref 79–97)
MONOCYTES: 5 %
Monocytes Absolute: 0.2 10*3/uL (ref 0.1–0.9)
NEUTROS PCT: 69 %
Neutrophils Absolute: 2.9 10*3/uL (ref 1.4–7.0)
PLATELETS: 47 10*3/uL — AB (ref 150–450)
RBC: 3.5 x10E6/uL — AB (ref 4.14–5.80)
RDW: 14.1 % (ref 12.3–15.4)
WBC: 4 10*3/uL (ref 3.4–10.8)

## 2018-02-16 LAB — CERULOPLASMIN: Ceruloplasmin: 23.3 mg/dL (ref 16.0–31.0)

## 2018-02-16 LAB — IRON,TIBC AND FERRITIN PANEL
FERRITIN: 103 ng/mL (ref 30–400)
IRON SATURATION: 43 % (ref 15–55)
IRON: 122 ug/dL (ref 38–169)
Total Iron Binding Capacity: 287 ug/dL (ref 250–450)
UIBC: 165 ug/dL (ref 111–343)

## 2018-02-16 LAB — HEPATITIS B SURFACE ANTIBODY,QUALITATIVE: Hep B Surface Ab, Qual: NONREACTIVE

## 2018-02-16 LAB — HEPATIC FUNCTION PANEL
ALBUMIN: 3 g/dL — AB (ref 3.6–4.8)
ALK PHOS: 149 IU/L — AB (ref 39–117)
ALT: 13 IU/L (ref 0–44)
AST: 29 IU/L (ref 0–40)
BILIRUBIN TOTAL: 0.9 mg/dL (ref 0.0–1.2)
BILIRUBIN, DIRECT: 0.28 mg/dL (ref 0.00–0.40)
Total Protein: 7.1 g/dL (ref 6.0–8.5)

## 2018-02-16 LAB — HEPATITIS B SURFACE ANTIGEN: HEP B S AG: NEGATIVE

## 2018-02-16 LAB — HEPATITIS B E ANTIBODY: Hep B E Ab: NEGATIVE

## 2018-02-16 LAB — HEPATITIS B E ANTIGEN: HEP B E AG: NEGATIVE

## 2018-02-16 LAB — MITOCHONDRIAL/SMOOTH MUSCLE AB PNL
Mitochondrial Ab: <20 Units (ref 0.0–20.0)
SMOOTH MUSCLE AB: 65 U — AB (ref 0–19)

## 2018-02-16 LAB — ANTI-MICROSOMAL ANTIBODY LIVER / KIDNEY: LKM1 Ab: 1.1 Units (ref 0.0–20.0)

## 2018-02-16 LAB — HEPATITIS B CORE ANTIBODY, TOTAL: Hep B Core Total Ab: NEGATIVE

## 2018-02-16 LAB — ALPHA-1-ANTITRYPSIN: A-1 Antitrypsin: 140 mg/dL (ref 90–200)

## 2018-02-16 LAB — HEPATITIS A ANTIBODY, TOTAL: Hep A Total Ab: POSITIVE — AB

## 2018-02-20 ENCOUNTER — Ambulatory Visit
Admission: RE | Admit: 2018-02-20 | Discharge: 2018-02-20 | Disposition: A | Discharge status: Home / Self Care | Payer: Medicare PPO | Source: Ambulatory Visit | Attending: Gastroenterology | Admitting: Gastroenterology

## 2018-02-20 ENCOUNTER — Ambulatory Visit: Payer: Medicare PPO | Admitting: Anesthesiology

## 2018-02-20 ENCOUNTER — Encounter: Payer: Self-pay | Admitting: *Deleted

## 2018-02-20 ENCOUNTER — Encounter
Admission: RE | Disposition: A | Discharge status: Home / Self Care | Payer: Self-pay | Source: Ambulatory Visit | Attending: Gastroenterology

## 2018-02-20 DIAGNOSIS — I10 Essential (primary) hypertension: Secondary | ICD-10-CM | POA: Diagnosis not present

## 2018-02-20 DIAGNOSIS — E119 Type 2 diabetes mellitus without complications: Secondary | ICD-10-CM | POA: Diagnosis not present

## 2018-02-20 DIAGNOSIS — I255 Ischemic cardiomyopathy: Secondary | ICD-10-CM | POA: Diagnosis not present

## 2018-02-20 DIAGNOSIS — I2510 Atherosclerotic heart disease of native coronary artery without angina pectoris: Secondary | ICD-10-CM | POA: Diagnosis not present

## 2018-02-20 DIAGNOSIS — J449 Chronic obstructive pulmonary disease, unspecified: Secondary | ICD-10-CM | POA: Diagnosis not present

## 2018-02-20 DIAGNOSIS — R1310 Dysphagia, unspecified: Secondary | ICD-10-CM | POA: Diagnosis not present

## 2018-02-20 DIAGNOSIS — E785 Hyperlipidemia, unspecified: Secondary | ICD-10-CM | POA: Insufficient documentation

## 2018-02-20 DIAGNOSIS — Z6839 Body mass index (BMI) 39.0-39.9, adult: Secondary | ICD-10-CM | POA: Insufficient documentation

## 2018-02-20 DIAGNOSIS — Z955 Presence of coronary angioplasty implant and graft: Secondary | ICD-10-CM | POA: Diagnosis not present

## 2018-02-20 DIAGNOSIS — K219 Gastro-esophageal reflux disease without esophagitis: Secondary | ICD-10-CM | POA: Diagnosis not present

## 2018-02-20 DIAGNOSIS — E669 Obesity, unspecified: Secondary | ICD-10-CM | POA: Diagnosis not present

## 2018-02-20 DIAGNOSIS — K7460 Unspecified cirrhosis of liver: Secondary | ICD-10-CM

## 2018-02-20 DIAGNOSIS — G4733 Obstructive sleep apnea (adult) (pediatric): Secondary | ICD-10-CM | POA: Diagnosis not present

## 2018-02-20 HISTORY — PX: ESOPHAGOGASTRODUODENOSCOPY (EGD) WITH PROPOFOL: SHX5813

## 2018-02-20 SURGERY — ESOPHAGOGASTRODUODENOSCOPY (EGD) WITH PROPOFOL
Anesthesia: General

## 2018-02-20 MED ORDER — PROPOFOL 10 MG/ML IV BOLUS
INTRAVENOUS | Status: AC
  Filled 2018-02-20: qty 20

## 2018-02-20 MED ORDER — PROPOFOL 500 MG/50ML IV EMUL
INTRAVENOUS | Status: DC | PRN
  Administered 2018-02-20: 150 ug/kg/min via INTRAVENOUS

## 2018-02-20 MED ORDER — SODIUM CHLORIDE 0.9 % IV SOLN
INTRAVENOUS | Status: DC
  Administered 2018-02-20: 1000 mL via INTRAVENOUS

## 2018-02-20 MED ORDER — LIDOCAINE HCL (CARDIAC) PF 100 MG/5ML IV SOSY
PREFILLED_SYRINGE | INTRAVENOUS | Status: DC | PRN
  Administered 2018-02-20: 100 mg via INTRAVENOUS

## 2018-02-20 MED ORDER — MIDAZOLAM HCL 2 MG/2ML IJ SOLN
INTRAMUSCULAR | Status: AC
  Filled 2018-02-20: qty 2

## 2018-02-20 MED ORDER — PROPOFOL 10 MG/ML IV BOLUS
INTRAVENOUS | Status: DC | PRN
  Administered 2018-02-20: 100 mg via INTRAVENOUS

## 2018-02-20 NOTE — Anesthesia Postprocedure Evaluation (Signed)
Anesthesia Post Note  Patient: [name]  Procedure(s) Performed: ESOPHAGOGASTRODUODENOSCOPY (EGD) WITH PROPOFOL (N/A )  Patient location during evaluation: Endoscopy Anesthesia Type: General Level of consciousness: awake and alert Pain management: pain level controlled Vital Signs Assessment: post-procedure vital signs reviewed and stable Respiratory status: spontaneous breathing, nonlabored ventilation, respiratory function stable and patient connected to nasal cannula oxygen Cardiovascular status: blood pressure returned to baseline and stable Postop Assessment: no apparent nausea or vomiting Anesthetic complications: no     Last Vitals:  Vitals:   02/20/18 1412 02/20/18 1422  BP: 134/80 132/70  Pulse: 76 71  Resp: 15 19  Temp:    SpO2: 98% 100%    Last Pain:  Vitals:   02/20/18 1422  TempSrc:   PainSc: 0-No pain                 Jayliana Valencia S

## 2018-02-20 NOTE — H&P (Signed)
[name], MD [address], [place], [place], [place], [address] [address], [place], [place], [place], [address] Phone: [phone]  Fax: [phone]  Primary Care Physician:  Ria Bush, MD   Pre-Procedure History & Physical: HPI:  [name] is a 62 y.o. male is here for an endoscopy    Past Medical History:  Diagnosis Date  . AAA (abdominal aortic aneurysm) ([place]) 09/2012--  monitored by dr Trula Slade   stable 5.6cm CTA abdomen 2016  . Abnormal drug screen 07/09/2016   1/2/018 - positive oxycodone, fentanyl, inapprop positive MJ - mod risk  . Allergic rhinitis   . B12 deficiency   . CAD (coronary artery disease) cardiologist-  dr Stanford Breed   x3 with stents last 2005, EF 40%, predominantly RCA by CT 2016  . Cataracts, bilateral   . Cervical spondylosis 05/2010   s/p surgery  . Charcot Marie Tooth muscular atrophy dx  [phone]   neurologist--  dr love--  type 2 per pt  . Chronic pain syndrome    established with [facility] ([place]) --> disagreement and transfered care to Dr Sanjuan Dame at [facility] clinic [facility], requests PCP write Rx but f/u with pain clinic Q6-12 months  . COPD (chronic obstructive pulmonary disease) ([place]) 10/2011   minimal by PFTs  . DDD (degenerative disc disease)   . Disturbances of sensation of smell and taste    improving  . Dyspnea on exertion   . GERD (gastroesophageal reflux disease)   . Gout   . Headache   . Hepatitis    hepatitis B  . Hidradenitis    right groin  . Hidradenitis suppurativa dx 2011   goin and leg crease   followd by Lyndle Herrlich - daily bactrim, s/p intralesional steroid injection 10/2010  . Hip osteoarthritis    s/p intraarticular steroid shot (12/2012) ([place])  . History of hepatitis B 1983  . History of MI (myocardial infarction)    2000  &  2005  . History of pneumonia   . History of viral meningitis 2000  . HLD (hyperlipidemia)   . HTN (hypertension)   . Ischemic  cardiomyopathy    s/p inferior MI  --  current ef per myoview 39%  . Liver cirrhosis secondary to NASH ([place]) 01/2014   by CT scan, rec virtual colonoscopy by Dr Collene Mares 06/2014  . Lumbar herniated disc   . Myocardial infarction ([place])    x2  . Nocturia more than twice per night   . Obesity   . Spinal stenosis    released from [place].  established with preferred pain (07/2013)  . T2DM (type 2 diabetes mellitus) ([place])    ABIs WNL 2016  . Vitamin D deficiency     Past Surgical History:  Procedure Laterality Date  . ABDOMINAL AORTIC ENDOVASCULAR FENESTRATED STENT GRAFT N/A 11/30/2015   Procedure: ABDOMINAL AORTIC ENDOVASCULAR FENESTRATED STENT GRAFT;  Surgeon: Serafina Mitchell, MD;  Location: [place];  Service: Vascular;  Laterality: N/A;  . ANTERIOR CERVICAL DECOMP/DISCECTOMY FUSION  01-07-2010    C4 -- C7  . CARDIAC CATHETERIZATION  03-30-2005  dr Albertine Patricia   ef 40% w/ inferior akinesis/  LM and CFX angiographically normal/  pLAD 30%/   Widely patent stents in RCA and PDA widely patent  . CARDIOVASCULAR STRESS TEST  10-23-2012  dr Stanford Breed   No ischemia/  Moderate scar in the inferior wall, otherwise normal perfusion/  LV ef 39%,  LV wall motion: inferior/ inferolateral hypokinesis  . COLONOSCOPY  05/06/2007   normal, small int hemorrhoids rpt 5 yrs due to fmhx - rec against rpt colonoscopy by Dr Collene Mares  . CORONARY ANGIOPLASTY  2000  dr Stanford Breed   PCI to RCA and PDA  . CORONARY ANGIOPLASTY WITH STENT PLACEMENT  03-19-2005  dr Gwyndolyn Saxon downey   inferior STEMI--- DES x4 to RCA w/ balloon angioplasty and balloon angioplasty to jailed PDA ostium/  severe hypokinesis of midinferor wall, ef 50%/  dLM 20%,  mLAD 20%,  dCFX 60%  . ESOPHAGOGASTRODUODENOSCOPY  01/2017   dilated benign esophageal stenosis, portal hypertensive gastropathy Henrene Pastor)  . HYDRADENITIS EXCISION Right 12/31/2014   Procedure: WIDE EXCISION HIDRADENITIS GROIN; Coralie Keens, MD  . LUMBAR DISC SURGERY     L5-S1  . LUMBAR LAMINECTOMY   05-18-2010   L2--5   laminectomy/foraminotomy for stenosis Joya Salm)  . MYELOGRAM     L5-S1 and L1-2 spondylosis  . SACROILIAC JOINT INJECTION Bilateral 10/2013   [place]  . TONSILLECTOMY AND ADENOIDECTOMY  1972    Prior to Admission medications   Medication Sig Start Date End Date Taking? Authorizing Provider  albuterol (PROVENTIL HFA;VENTOLIN HFA) 108 (90 Base) MCG/ACT inhaler Inhale 2 puffs into the lungs every 6 (six) hours as needed for wheezing or shortness of breath. 08/17/15  Yes Jearld Fenton, NP  albuterol (PROVENTIL) (2.5 MG/3ML) 0.083% nebulizer solution USE 1 VIAL PER NEBULIZER EVERY 6 HRS AS NEEDED FOR WHEEZING 07/19/15  Yes Ria Bush, MD  aspirin 325 MG tablet Take 1 tablet (325 mg total) by mouth daily. 07/19/15  Yes Ria Bush, MD  B Complex-C (SUPER B COMPLEX PO) Take 1 tablet by mouth daily.   Yes [provider]  cholecalciferol 2000 units TABS Take 2,000 Units by mouth daily. 04/29/16  Yes Ria Bush, MD  dicyclomine (BENTYL) 20 MG tablet TAKE 1 TABLET BY MOUTH 3 TIMES DAILY BEFORE MEALS. AS NEEDED FOR ABDOMINAL PAIN 01/08/18  Yes Ria Bush, MD  DULoxetine (CYMBALTA) 60 MG capsule Take 1 capsule (60 mg total) by mouth daily. 11/15/17  Yes Ria Bush, MD  fentaNYL (DURAGESIC) 25 MCG/HR patch Place 1 patch (25 mcg total) onto the skin every other day. Alternating with 70mg patch 03/13/17  Yes GRia Bush MD  fluconazole (DIFLUCAN) 100 MG tablet TAKE 1 TABLET BY MOUTH DAILY 10/09/17  Yes CMichel Bickers MD  fluticasone ([facility] 50 MCG/ACT nasal spray Place 2 sprays into both nostrils daily as needed for allergies.  02/09/12  Yes GRia Bush MD  folic acid (FOLVITE) 1 MG tablet TAKE 1 TABLET BY MOUTH DAILY 11/19/17  Yes GRia Bush MD  irbesartan (AVAPRO) 75 MG tablet Take 0.5 tablets (37.5 mg total) by mouth daily. 11/15/17  Yes GRia Bush MD  loperamide (IMODIUM) 2 MG capsule Take 1 capsule (2 mg total) by mouth  4 (four) times daily as needed for diarrhea or loose stools. 06/12/16  Yes Ward, KCyril MourningN, DO  metoprolol succinate (TOPROL-XL) 25 MG 24 hr tablet TAKE 1/2 TABLET BY MOUTH ONCE DAILY. 02/06/18  Yes GRia Bush MD  Misc Natural Products ([facility]) CAPS Take 1 capsule by mouth 2 (two) times daily.   Yes [provider]  nicotine (NICODERM CQ) 14 mg/24hr patch Place 1 patch (14 mg total) onto the skin daily. 01/28/18  Yes GRia Bush MD  nicotine (NICODERM CQ) 7 mg/24hr patch Place 1 patch (7 mg total) onto the skin daily. 01/28/18  Yes GRia Bush MD  nitroGLYCERIN (  NITROSTAT) 0.4 MG SL tablet Place 1 tablet (0.4 mg total) under the tongue every 5 (five) minutes as needed for chest pain. 10/10/12  Yes Lelon Perla, MD  nystatin (MYCOSTATIN/NYSTOP) powder Apply topically 2 (two) times daily. 06/26/17  Yes Michel Bickers, MD  omeprazole (PRILOSEC) 40 MG capsule Take 1 capsule (40 mg total) by mouth daily. 11/15/17  Yes Ria Bush, MD  ondansetron (ZOFRAN-ODT) 4 MG disintegrating tablet Take 1 tablet (4 mg total) by mouth every 8 (eight) hours as needed for nausea or vomiting. Day supply per insurance. 01/28/18  Yes Ria Bush, MD  oxyCODONE (ROXICODONE) 15 MG immediate release tablet Take 1 tablet (15 mg total) every 4 (four) hours as needed by mouth. 04/19/17  Yes Ria Bush, MD  polyethylene glycol powder (GLYCOLAX/MIRALAX) powder Take 17 g by mouth daily as needed for moderate constipation. 01/15/14  Yes Ria Bush, MD  Red Yeast Rice 600 MG CAPS Take 2 capsules by mouth daily.   Yes [provider]  vitamin B-12 (CYANOCOBALAMIN) 500 MCG tablet Take by mouth. 11/19/17  Yes [provider]  chlorhexidine (HIBICLENS) 4 % external liquid Apply topically daily as needed. 02/01/17   Ria Bush, MD  fentaNYL (DURAGESIC - DOSED MCG/HR) 50 MCG/HR Place 1 patch (50 mcg total) every other day onto the skin. 04/19/17    Ria Bush, MD  traZODone (DESYREL) 50 MG tablet Take 1 tablet (50 mg total) by mouth at bedtime as needed for sleep. Patient not taking: Reported on 02/20/2018 11/21/17   Ria Bush, MD    Allergies as of 02/13/2018 - Review Complete 02/13/2018  Allergen Reaction Noted  . Statins Shortness Of Breath 01/22/2012  . Losartan Other (See Comments) 01/30/2013  . Tramadol Nausea Only 03/13/2017  . Allopurinol Nausea Only 12/18/2012  . Baclofen Nausea And Vomiting 06/23/2015  . Gabapentin Nausea And Vomiting 04/21/2015    Family History  Problem Relation Age of Onset  . Cancer Mother        colon  . Diabetes Mother   . Kidney disease Mother   . Aneurysm Mother        AAA  . Rheum arthritis Mother   . Charcot-Marie-Tooth disease Mother   . Heart disease Mother        before age 1  . Cancer Father        skin  . Heart attack Father   . Heart disease Father        before age 19  . Cancer Brother        skin  . Coronary artery disease Brother   . Cancer Brother        small cell lung cancer  . Aneurysm Brother        AAA  . Rheum arthritis Sister   . Rheum arthritis Brother   . Prostate cancer Neg Hx   . Bladder Cancer Neg Hx   . Kidney cancer Neg Hx     Social History   Socioeconomic History  . Marital status: Married    Spouse name: [name]  . Number of children: 0  . Years of education: College  . Highest education level: Not on file  Occupational History  . Occupation: Neurosurgeon implants, crown and bridge-now disability 2006    Employer: UNEMPLOYED  Social Needs  . Financial resource strain: Not on file  . Food insecurity:    Worry: Patient refused    Inability: Patient refused  . Transportation needs:    Medical: Not  on file    Non-medical: Not on file  Tobacco Use  . Smoking status: Former Smoker    Packs/day: 1.00    Years: 57.00    Pack years: 57.00    Types: Cigarettes    Start date: 06/06/1967    Last attempt to quit: 02/20/2018   . Smokeless tobacco: Never Used  . Tobacco comment: stopped smoking a pipe in 2015  DOES SMOKE E CIG  Substance and Sexual Activity  . Alcohol use: No    Alcohol/week: 0.0 standard drinks  . Drug use: Yes    Types: Fentanyl, Hydrocodone  . Sexual activity: Not on file  Lifestyle  . Physical activity:    Days per week: Patient refused    Minutes per session: Patient refused  . Stress: Not on file  Relationships  . Social connections:    Talks on phone: Patient refused    Gets together: Patient refused    Attends religious service: Patient refused    Active member of club or organization: Patient refused    Attends meetings of clubs or organizations: Patient refused    Relationship status: Patient refused  . Intimate partner violence:    Fear of current or ex partner: Not on file    Emotionally abused: Not on file    Physically abused: Not on file    Forced sexual activity: Not on file  Other Topics Concern  . Not on file  Social History Narrative   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 x 5 years   caffeine: 2 cups coffee, 2 cups soda   Occupation: Neurosurgeon implants, crowne and bridge, now disability 2006   Lives with wife, 1 dog, no children    Review of Systems: See HPI, otherwise negative ROS  Physical Exam: BP (!) 130/92   Pulse 71   Temp (!) 96.3 F (35.7 C) (Tympanic)   Resp 20   Ht 5' 8"  (1.727 m)   Wt 118.8 kg   SpO2 100%   BMI 39.84 kg/m  General:   Alert,  pleasant and cooperative in NAD Head:  Normocephalic and atraumatic. Neck:  Supple; no masses or thyromegaly. Lungs:  Clear throughout to auscultation, normal respiratory effort.    Heart:  +S1, +S2, Regular rate and rhythm, No edema. Abdomen:  Soft, nontender and nondistended. Normal bowel sounds, without guarding, and without rebound.   Neurologic:  Alert and  oriented x4;  grossly normal neurologically.  Impression/Plan: [name] is here for an endoscopy  to be performed for   evaluation of dysphagia    Risks, benefits, limitations, and alternatives regarding endoscopy and possibel dilation  have been reviewed with the patient.  Questions have been answered.  All parties agreeable.   [name], MD  02/20/2018, 1:24 PM

## 2018-02-20 NOTE — Anesthesia Preprocedure Evaluation (Addendum)
Anesthesia Evaluation  Patient identified by MRN, date of birth, ID band Patient awake    Reviewed: Allergy & Precautions, NPO status , Patient's Chart, lab work & pertinent test results, reviewed documented beta blocker date and time   Airway Mallampati: III  TM Distance: >3 FB     Dental  (+) Chipped, Upper Dentures, Poor Dentition, Missing   Pulmonary sleep apnea , COPD, former smoker,           Cardiovascular hypertension, Pt. on medications and Pt. on home beta blockers + Past MI, + Cardiac Stents, + Peripheral Vascular Disease and + DOE       Neuro/Psych  Headaches, PSYCHIATRIC DISORDERS Depression  Neuromuscular disease    GI/Hepatic GERD  ,(+) Hepatitis -  Endo/Other  diabetes, Type 2  Renal/GU      Musculoskeletal  (+) Arthritis ,   Abdominal   Peds  Hematology  (+) anemia ,   Anesthesia Other Findings Obese. Gout. Smokes. duragesic patch, has not had it for 3 days. Last Hb 11.6.  Reproductive/Obstetrics                            Anesthesia Physical Anesthesia Plan  ASA: III  Anesthesia Plan: General   Post-op Pain Management:    Induction: Intravenous  PONV Risk Score and Plan:   Airway Management Planned:   Additional Equipment:   Intra-op Plan:   Post-operative Plan:   Informed Consent: I have reviewed the patients History and Physical, chart, labs and discussed the procedure including the risks, benefits and alternatives for the proposed anesthesia with the patient or authorized representative who has indicated his/her understanding and acceptance.     Plan Discussed with: CRNA  Anesthesia Plan Comments:         Anesthesia Quick Evaluation

## 2018-02-20 NOTE — Anesthesia Post-op Follow-up Note (Signed)
Anesthesia QCDR form completed.        

## 2018-02-20 NOTE — Op Note (Signed)
[facility] Patient Name: [name] Procedure Date: 02/20/2018 1:26 PM MRN: [id number] Account #: [id number] Date of Birth: Jan 05, 1956 Admit Type: Outpatient Age: 62 Room: [facility] ENDO ROOM 1 Gender: Male Note Status: Finalized Procedure:            Upper GI endoscopy Indications:          Dysphagia Providers:            [name] MD, MD Medicines:            Monitored Anesthesia Care Complications:        No immediate complications. Procedure:            Pre-Anesthesia Assessment:                       - Prior to the procedure, a History and Physical was                        performed, and patient medications, allergies and                        sensitivities were reviewed. The patient's tolerance of                        previous anesthesia was reviewed.                       - The risks and benefits of the procedure and the                        sedation options and risks were discussed with the                        patient. All questions were answered and informed                        consent was obtained.                       - ASA Grade Assessment: III - A patient with severe                        systemic disease.                       After obtaining informed consent, the endoscope was                        passed under direct vision. Throughout the procedure,                        the patient's blood pressure, pulse, and oxygen                        saturations were monitored continuously. The Endoscope                        was introduced through the mouth, and advanced to the                        third part of duodenum. The upper GI endoscopy was  accomplished with ease. The patient tolerated the                        procedure well. Findings:      The examined duodenum was normal.      The stomach was normal.      The examined esophagus was normal. Biopsies were taken with a cold   forceps for histology.      The cardia and gastric fundus were normal on retroflexion. Impression:           - Normal examined duodenum.                       - Normal stomach.                       - Normal esophagus. Biopsied. Recommendation:       - Await pathology results.                       - Discharge patient to home (with escort).                       - Resume previous diet.                       - Continue present medications.                       - Return to my office in 6 weeks. Procedure Code(s):    --- Professional ---                       [phone], Esophagogastroduodenoscopy, flexible, transoral;                        with biopsy, single or multiple Diagnosis Code(s):    --- Professional ---                       R13.10, Dysphagia, unspecified CPT copyright 2017 American Medical Association. All rights reserved. The codes documented in this report are preliminary and upon coder review may  be revised to meet current compliance requirements. [name], MD [name] MD, MD 02/20/2018 1:59:29 PM This report has been signed electronically. Number of Addenda: 0 Note Initiated On: 02/20/2018 1:26 PM      [facility]

## 2018-02-20 NOTE — Transfer of Care (Signed)
Immediate Anesthesia Transfer of Care Note  Patient: [name]  Procedure(s) Performed: ESOPHAGOGASTRODUODENOSCOPY (EGD) WITH PROPOFOL (N/A )  Patient Location: PACU  Anesthesia Type:General  Level of Consciousness: drowsy  Airway & Oxygen Therapy: Patient Spontanous Breathing and Patient connected to nasal cannula oxygen  Post-op Assessment: Report given to RN and Post -op Vital signs reviewed and stable  Post vital signs: Reviewed and stable  Last Vitals:  Vitals Value Taken Time  BP 97/63 02/20/2018  2:03 PM  Temp 36.2 C 02/20/2018  2:02 PM  Pulse 64 02/20/2018  2:03 PM  Resp 11 02/20/2018  2:03 PM  SpO2 99 % 02/20/2018  2:03 PM  Vitals shown include unvalidated device data.  Last Pain:  Vitals:   02/20/18 1402  TempSrc: Tympanic      Patients Stated Pain Goal: 0 (02/77/41 2878)  Complications: No apparent anesthesia complications

## 2018-02-21 DIAGNOSIS — Z716 Tobacco abuse counseling: Secondary | ICD-10-CM | POA: Diagnosis not present

## 2018-02-21 DIAGNOSIS — G894 Chronic pain syndrome: Secondary | ICD-10-CM | POA: Diagnosis not present

## 2018-02-21 DIAGNOSIS — Z720 Tobacco use: Secondary | ICD-10-CM | POA: Diagnosis not present

## 2018-02-21 DIAGNOSIS — M545 Low back pain: Secondary | ICD-10-CM | POA: Diagnosis not present

## 2018-02-21 DIAGNOSIS — M542 Cervicalgia: Secondary | ICD-10-CM | POA: Diagnosis not present

## 2018-02-21 DIAGNOSIS — Z87891 Personal history of nicotine dependence: Secondary | ICD-10-CM | POA: Diagnosis not present

## 2018-02-21 DIAGNOSIS — F17200 Nicotine dependence, unspecified, uncomplicated: Secondary | ICD-10-CM | POA: Diagnosis not present

## 2018-02-21 DIAGNOSIS — Z79891 Long term (current) use of opiate analgesic: Secondary | ICD-10-CM | POA: Diagnosis not present

## 2018-02-23 LAB — SURGICAL PATHOLOGY

## 2018-02-24 ENCOUNTER — Encounter: Payer: Self-pay | Admitting: Gastroenterology

## 2018-02-25 ENCOUNTER — Ambulatory Visit: Payer: Medicare PPO | Admitting: Internal Medicine

## 2018-02-25 ENCOUNTER — Encounter: Payer: Self-pay | Admitting: Family Medicine

## 2018-02-25 ENCOUNTER — Telehealth: Payer: Self-pay

## 2018-02-25 NOTE — Telephone Encounter (Signed)
-----   Message from [name], MD sent at 02/24/2018  4:19 PM EDT ----- [name]   Inform - liver tests performed recently may suggest that he has an autoimmune liver condition. He has a positive smooth muscle antibody and elevated immunoglobulins.   Suggest  1. He needs hepatis B vacine with Ria Bush, MD as he is not immune 2. He needs an appointment sooner with me probably in 1-2 weeks to discuss possible liver biopsy to determine if he has autoimmune hepatitits.   Elizbeth Squires, MD

## 2018-02-25 NOTE — Telephone Encounter (Signed)
Spoke with [name] and his wife about results and Dr. Georgeann Oppenheim instructions for [name] to schedule a follow up appointment him within 1-2 weeks and also to see [name] PCP for Hep B vaccine.

## 2018-02-28 ENCOUNTER — Telehealth: Payer: Self-pay

## 2018-02-28 NOTE — Telephone Encounter (Signed)
I spoke with pts wife (DPR signed) and she missed a call from [facility] this morning and returning that call. Mrs Barkan said GI doctor spoke with Dr Darnell Level and pt was to come in to office to get Hep B booster.Please advise. Mrs Ostermiller request cb.

## 2018-02-28 NOTE — Telephone Encounter (Signed)
Copied from [place] [id number]. Topic: General - Other >> Feb 28, 2018 11:22 AM Cecelia Byars, NT wrote: Reason for CRM: Patients wife called and says he would like to he  a hep b injection please advise ,[phone]

## 2018-02-28 NOTE — Telephone Encounter (Signed)
Spoke with pt's wife, [name] (on dpr). States pt received a vm from our office about needing hep b booster. [See note in 02/13/18 labs.]  Schedule pt for [place] on 03/14/18 at 8:30 AM.

## 2018-03-01 ENCOUNTER — Telehealth: Payer: Self-pay

## 2018-03-01 NOTE — Telephone Encounter (Signed)
Copied from [place] [id number]. Topic: General - Other >> Mar 01, 2018 11:28 AM Yvette Rack wrote: Reason for CRM: Fritz Pickerel Nurse Practioner  ([place])from [facility] [phone] calling to let Dr Darnell Level know that she will be faxing over some notes on pt and that they aren't trying to take over the care of pt that they just are eyes and ears for the home

## 2018-03-04 NOTE — Telephone Encounter (Signed)
noted 

## 2018-03-06 ENCOUNTER — Ambulatory Visit: Payer: Medicare PPO | Admitting: Gastroenterology

## 2018-03-06 ENCOUNTER — Other Ambulatory Visit: Payer: Self-pay

## 2018-03-06 ENCOUNTER — Encounter: Payer: Self-pay | Admitting: Gastroenterology

## 2018-03-06 VITALS — BP 124/73 | HR 89 | Ht 70.0 in | Wt 267.4 lb

## 2018-03-06 DIAGNOSIS — K7460 Unspecified cirrhosis of liver: Secondary | ICD-10-CM

## 2018-03-06 NOTE — Progress Notes (Signed)
Miguel Bellows MD, MRCP(U.K) [address]  [place]  [place], [place] [address]  Main: [phone]  Fax: [phone]   Primary Care Physician: Ria Bush, MD  Primary Gastroenterologist:  Dr. Jonathon Ware   Chief Complaint  Patient presents with  . follow up Cirrhosis    HPI: [name] is a 62 y.o. male    Summary of history :  He was initially referred and seen on 02/13/18 for cirrhosis of the liver . He has been seen by [facility] in the past. Last office visit was in 10/04/16. His Cirrhosis had been attriuted to NASH.He says he has had hepatitis B in the past- approximately 20 years back , says he has not been treated for the same . No tatoos, no alcohol , no military service, no blood transfusions. Used cocaine in his 20's, none recently. Weighed 370 lbs at his heaviest. Was always " big", he is not diabetic.   EGD 01/2017 -PHG, mild stricture was dilated.  RUQ USG 11/2017 : Small ascites, cirrhosis, Chronic dilation of CBD, mild dilation of PD, splenomegaly   CT angio of the abdomen 12/2017:Post endovascular repair of suprarenal abdominal aortic aneurysm without evidence of complication .Suspected hemodynamically significant stenosis involving the origin of the common hepatic artery, similar to the 12/2015 examination.Potential hemodynamically significant narrowing involving the proximal aspect of the right SFA.Findings of hepatic cirrhosis and portal venous hypertension with several varices about the gastroesophageal junction, splenomegaly and trace amount of intra-abdominal ascites. No discrete hyperenhancing hepatic lesions  Labs :  11/2017: B12,normal ,albumin 3.1,HB 12.5 and MCV 100,platelets 70    Interval history   02/13/2018-  03/06/2018  02/20/18 ; EGD -normal -no eosinophils in esophagus.  Swallowing is better   Labs work 02/13/18 : Cr 1.03,Hep A ab positive , albumin 3.0 ,alk phos 149 , Hbsab/ag,HCV ab , ANA,negative .Ceruloplasmin ,alpha 1 AT,Iron  studies - normal  INR 1.2 . Hb 11.6 with MCV 99  Smooth muscle antibody positive , elevated immunoglobulins G, AMA,LKM ab  negative Naprosyn use : still taking   Salt : trying to cut down   Weight :  Gaining   October 10 th will get hep B shot .      Current Outpatient Medications  Medication Sig Dispense Refill  . albuterol (PROVENTIL HFA;VENTOLIN HFA) 108 (90 Base) MCG/ACT inhaler Inhale 2 puffs into the lungs every 6 (six) hours as needed for wheezing or shortness of breath. 1 Inhaler 0  . albuterol (PROVENTIL) (2.5 MG/3ML) 0.083% nebulizer solution USE 1 VIAL PER NEBULIZER EVERY 6 HRS AS NEEDED FOR WHEEZING 75 mL 6  . aspirin 325 MG tablet Take 1 tablet (325 mg total) by mouth daily. 90 tablet 3  . B Complex-C (SUPER B COMPLEX PO) Take 1 tablet by mouth daily.    . chlorhexidine (HIBICLENS) 4 % external liquid Apply topically daily as needed. 473 mL 0  . cholecalciferol 2000 units TABS Take 2,000 Units by mouth daily.    [name] dicyclomine (BENTYL) 20 MG tablet TAKE 1 TABLET BY MOUTH 3 TIMES DAILY BEFORE MEALS. AS NEEDED FOR ABDOMINAL PAIN 30 tablet 0  . DULoxetine (CYMBALTA) 60 MG capsule Take 1 capsule (60 mg total) by mouth daily. 30 capsule 11  . fentaNYL (DURAGESIC - DOSED MCG/HR) 50 MCG/HR Place 1 patch (50 mcg total) every other day onto the skin. 15 patch 0  . fentaNYL (DURAGESIC) 25 MCG/HR patch Place 1 patch (25 mcg total) onto the skin every other day.  Alternating with 30mg patch 7 patch 0  . fluconazole (DIFLUCAN) 100 MG tablet TAKE 1 TABLET BY MOUTH DAILY 14 tablet 2  . fluticasone (FLONASE) 50 MCG/ACT nasal spray Place 2 sprays into both nostrils daily as needed for allergies.     . folic acid (FOLVITE) 1 MG tablet TAKE 1 TABLET BY MOUTH DAILY 30 tablet 6  . irbesartan (AVAPRO) 75 MG tablet Take 0.5 tablets (37.5 mg total) by mouth daily. 15 tablet 11  . loperamide (IMODIUM) 2 MG capsule Take 1 capsule (2 mg total) by mouth 4 (four) times daily as needed for diarrhea or  loose stools. 12 capsule 0  . metoprolol succinate (TOPROL-XL) 25 MG 24 hr tablet TAKE 1/2 TABLET BY MOUTH ONCE DAILY. 45 tablet 1  . Misc Natural Products (TART CHERRY ADVANCED) CAPS Take 1 capsule by mouth 2 (two) times daily.    . nicotine (NICODERM CQ) 14 mg/24hr patch Place 1 patch (14 mg total) onto the skin daily. 28 patch 0  . nicotine (NICODERM CQ) 7 mg/24hr patch Place 1 patch (7 mg total) onto the skin daily. 28 patch 0  . nitroGLYCERIN (NITROSTAT) 0.4 MG SL tablet Place 1 tablet (0.4 mg total) under the tongue every 5 (five) minutes as needed for chest pain. 25 tablet 12  . nystatin (MYCOSTATIN/NYSTOP) powder Apply topically 2 (two) times daily. 60 g 3  . omeprazole (PRILOSEC) 40 MG capsule Take 1 capsule (40 mg total) by mouth daily. 30 capsule 11  . ondansetron (ZOFRAN-ODT) 4 MG disintegrating tablet Take 1 tablet (4 mg total) by mouth every 8 (eight) hours as needed for nausea or vomiting. Day supply per insurance. 20 tablet 3  . oxyCODONE (ROXICODONE) 15 MG immediate release tablet Take 1 tablet (15 mg total) every 4 (four) hours as needed by mouth. 180 tablet 0  . polyethylene glycol powder (GLYCOLAX/MIRALAX) powder Take 17 g by mouth daily as needed for moderate constipation. 3350 g 0  . Red Yeast Rice 600 MG CAPS Take 2 capsules by mouth daily.    . traZODone (DESYREL) 50 MG tablet Take 1 tablet (50 mg total) by mouth at bedtime as needed for sleep. (Patient not taking: Reported on 02/20/2018) 30 tablet 3  . vitamin B-12 (CYANOCOBALAMIN) 500 MCG tablet Take by mouth.     No current facility-administered medications for this visit.     Allergies as of 03/06/2018 - Review Complete 03/06/2018  Allergen Reaction Noted  . Statins Shortness Of Breath 01/22/2012  . Losartan Other (See Comments) 01/30/2013  . Penicillins  02/20/2018  . Tramadol Nausea Only 03/13/2017  . Allopurinol Nausea Only 12/18/2012  . Baclofen Nausea And Vomiting 06/23/2015  . Gabapentin Nausea And Vomiting  04/21/2015    ROS:  General: Negative for anorexia, weight loss, fever, chills, fatigue, weakness. ENT: Negative for hoarseness, difficulty swallowing , nasal congestion. CV: Negative for chest pain, angina, palpitations, dyspnea on exertion, peripheral edema.  Respiratory: Negative for dyspnea at rest, dyspnea on exertion, cough, sputum, wheezing.  GI: See history of present illness. GU:  Negative for dysuria, hematuria, urinary incontinence, urinary frequency, nocturnal urination.  Endo: Negative for unusual weight change.    Physical Examination:   BP 124/73   Pulse 89   Ht 5' 10"  (1.778 m)   Wt 267 lb 6.4 oz (121.3 kg)   BMI 38.37 kg/m   General: Well-nourished, well-developed in no acute distress.  Eyes: No icterus. Conjunctivae pink. Mouth: Oropharyngeal mucosa moist and pink , no lesions erythema or  exudate. Lungs: Clear to auscultation bilaterally. Non-labored. Heart: Regular rate and rhythm, no murmurs rubs or gallops.  Abdomen: Bowel sounds are normal, nontender, nondistended, no hepatosplenomegaly or masses, no abdominal bruits or hernia , no rebound or guarding.   Extremities: No lower extremity edema. No clubbing or deformities. Neuro: Alert and oriented x 3.  Grossly intact. Skin: Warm and dry, no jaundice.   Psych: Alert and cooperative, normal mood and affect.   Imaging Studies: No results found.  Assessment and Plan:   [name] is a 62 y.o. y/o male *here to follow up  for  liver cirrhosis. In the past been felt by his last GI in 2018 was probably NASH related, autoimmune work up shows a positive smooth muscle ab and elevated immunoglobulins  .MELD 9    Plan  1. Needs hep B vaccine as scheduled  2. MRI liver and pancreas in 05/2018 to screen for [facility] and evaluate dilated PD and CBD 3. Low salt diet , low fat diet.  4. EGD to screen for varices in 2022 5. Stop smoking/vaping   6. Liver biopsy to determine if he has autoimmune hepatitis.      Dr Miguel Bellows  MD,MRCP [facility]) Follow up in 6 weels

## 2018-03-07 ENCOUNTER — Ambulatory Visit: Payer: Medicare PPO

## 2018-03-08 ENCOUNTER — Telehealth: Payer: Self-pay | Admitting: Gastroenterology

## 2018-03-08 NOTE — Telephone Encounter (Signed)
Pamala Hurry from [facility] scheduling calling to inform that pt is scheduled for Liver Biopsy 03/19/2018 at 8:00 Am at [facility] please call pt to inform him of this apt

## 2018-03-08 NOTE — Telephone Encounter (Signed)
Spoke with pt spouse, [name], I have informed her of Mr. Emily's liver biopsy appointment information. She is aware of pt appointment date, time, and location.

## 2018-03-14 ENCOUNTER — Other Ambulatory Visit: Payer: Self-pay | Admitting: Family Medicine

## 2018-03-14 ENCOUNTER — Ambulatory Visit (INDEPENDENT_AMBULATORY_CARE_PROVIDER_SITE_OTHER): Payer: Medicare PPO | Admitting: Internal Medicine

## 2018-03-14 ENCOUNTER — Ambulatory Visit: Payer: Medicare PPO

## 2018-03-14 ENCOUNTER — Encounter: Payer: Self-pay | Admitting: Internal Medicine

## 2018-03-14 DIAGNOSIS — B372 Candidiasis of skin and nail: Secondary | ICD-10-CM | POA: Diagnosis not present

## 2018-03-14 MED ORDER — FLUCONAZOLE 100 MG PO TABS: 100.0000 mg | ORAL_TABLET | Freq: Every day | ORAL | 2 refills | Status: DC

## 2018-03-14 MED ORDER — NYSTATIN 100000 UNIT/GM EX POWD: Freq: Two times a day (BID) | CUTANEOUS | 3 refills | Status: DC

## 2018-03-14 NOTE — Progress Notes (Signed)
[facility] for Infectious Disease  Patient Active Problem List   Diagnosis Date Noted  . Candidal intertrigo 05/08/2017    Priority: High  . Hidradenitis 05/03/2010    Priority: High  . Aortic atherosclerosis ([place]) 01/01/2018  . MDD (major depressive disorder), recurrent episode, moderate ([place]) 11/17/2017  . Recurrent falls 03/13/2017  . Abnormal drug screen 07/09/2016  . Thrombocytopenia ([place]) 04/29/2016  . Anemia 04/29/2016  . Protein-calorie malnutrition ([place]) 04/29/2016  . Encounter for chronic pain management 01/04/2016  . Preop cardiovascular exam 09/01/2014  . Chronic fatigue 07/22/2014  . Esophageal dysphagia 07/22/2014  . Gross hematuria 05/19/2014  . Health maintenance examination 04/21/2014  . Advanced care planning/counseling discussion 04/21/2014  . Orthostatic hypotension 03/13/2014  . Cirrhosis of liver without ascites ([place]) 01/03/2014  . Osteoarthritis of right hip 08/16/2012  . Medicare annual wellness visit, subsequent 06/07/2012  . AAA (abdominal aortic aneurysm) ([place])   . DOE (dyspnea on exertion) 10/06/2011  . DDD (degenerative disc disease), cervical   . Vitamin D deficiency 03/14/2011  . Skin rash 03/14/2011  . Vitamin B12 deficiency 08/05/2010  . Disturbances of sensation of smell and taste 08/02/2010  . Prediabetes 06/27/2010  . OSA (obstructive sleep apnea) 05/16/2010  . Cervical spondylosis 05/05/2010  . Severe obesity (BMI 35.0-39.9) with comorbidity ([place]) 05/03/2010  . Smoker 10/21/2009  . HLD (hyperlipidemia) 09/07/2009  . Gout 09/07/2009  . 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 Essential hypertension 09/07/2009  . Coronary atherosclerosis 09/07/2009  . CARDIOMYOPATHY 09/07/2009  . COPD (chronic obstructive pulmonary disease) ([place]) 09/07/2009  . Chronic pain syndrome 09/07/2009    Patient's Medications  New Prescriptions   No medications on file  Previous Medications   ALBUTEROL (PROVENTIL HFA;VENTOLIN HFA) 108  (90 BASE) MCG/ACT INHALER    Inhale 2 puffs into the lungs every 6 (six) hours as needed for wheezing or shortness of breath.   ALBUTEROL (PROVENTIL) (2.5 MG/3ML) 0.083% NEBULIZER SOLUTION    USE 1 VIAL PER NEBULIZER EVERY 6 HRS AS NEEDED FOR WHEEZING   ASPIRIN 325 MG TABLET    Take 1 tablet (325 mg total) by mouth daily.   B COMPLEX-C (SUPER B COMPLEX PO)    Take 1 tablet by mouth daily.   CHLORHEXIDINE (HIBICLENS) 4 % EXTERNAL LIQUID    Apply topically daily as needed.   CHOLECALCIFEROL 2000 UNITS TABS    Take 2,000 Units by mouth daily.   DICYCLOMINE (BENTYL) 20 MG TABLET    TAKE 1 TABLET BY MOUTH 3 TIMES DAILY BEFORE MEALS. AS NEEDED FOR ABDOMINAL PAIN   DULOXETINE (CYMBALTA) 60 MG CAPSULE    TAKE 1 CAPSULE BY MOUTH EVERY DAY   FENTANYL (DURAGESIC - DOSED MCG/HR) 50 MCG/HR    Place 1 patch (50 mcg total) every other day onto the skin.   FENTANYL (DURAGESIC) 25 MCG/HR PATCH    Place 1 patch (25 mcg total) onto the skin every other day. Alternating with 28mg patch   FLUTICASONE (FLONASE) 50 MCG/ACT NASAL SPRAY    Place 2 sprays into both nostrils daily as needed for allergies.    FOLIC ACID (FOLVITE) 1 MG TABLET    TAKE 1 TABLET BY MOUTH DAILY   IRBESARTAN (AVAPRO) 75 MG TABLET    Take 0.5 tablets (37.5 mg total) by mouth daily.   LOPERAMIDE (IMODIUM) 2 MG CAPSULE    Take 1 capsule (2 mg total) by mouth 4 (four) times daily as needed for diarrhea or loose stools.  METOPROLOL SUCCINATE (TOPROL-XL) 25 MG 24 HR TABLET    TAKE 1/2 TABLET BY MOUTH ONCE DAILY.   MISC NATURAL PRODUCTS (TART CHERRY ADVANCED) CAPS    Take 1 capsule by mouth 2 (two) times daily.   NICOTINE (NICODERM CQ) 14 MG/24HR PATCH    Place 1 patch (14 mg total) onto the skin daily.   NICOTINE (NICODERM CQ) 7 MG/24HR PATCH    Place 1 patch (7 mg total) onto the skin daily.   NITROGLYCERIN (NITROSTAT) 0.4 MG SL TABLET    Place 1 tablet (0.4 mg total) under the tongue every 5 (five) minutes as needed for chest pain.   OMEPRAZOLE  (PRILOSEC) 40 MG CAPSULE    Take 1 capsule (40 mg total) by mouth daily.   ONDANSETRON (ZOFRAN-ODT) 4 MG DISINTEGRATING TABLET    Take 1 tablet (4 mg total) by mouth every 8 (eight) hours as needed for nausea or vomiting. Day supply per insurance.   OXYCODONE (ROXICODONE) 15 MG IMMEDIATE RELEASE TABLET    Take 1 tablet (15 mg total) every 4 (four) hours as needed by mouth.   POLYETHYLENE GLYCOL POWDER (GLYCOLAX/MIRALAX) POWDER    Take 17 g by mouth daily as needed for moderate constipation.   RED YEAST RICE 600 MG CAPS    Take 2 capsules by mouth daily.   TRAZODONE (DESYREL) 50 MG TABLET    Take 1 tablet (50 mg total) by mouth at bedtime as needed for sleep.   VITAMIN B-12 (CYANOCOBALAMIN) 500 MCG TABLET    Take by mouth.  Modified Medications   Modified Medication Previous Medication   FLUCONAZOLE (DIFLUCAN) 100 MG TABLET fluconazole (DIFLUCAN) 100 MG tablet      Take 1 tablet (100 mg total) by mouth daily.    TAKE 1 TABLET BY MOUTH DAILY   NYSTATIN (MYCOSTATIN/NYSTOP) POWDER nystatin (MYCOSTATIN/NYSTOP) powder      Apply topically 2 (two) times daily.    Apply topically 2 (two) times daily.  Discontinued Medications   No medications on file    Subjective: [name] is in for his routine follow-up visit.  He has a history of hidradenitis but developed severe candidal intertrigo about 1 year ago.  I treated him in December with 2 weeks of fluconazole and topical nystatin and he had significant improvement.  He completed fluconazole in January.  He ran out of his nystatin powder recently.  He says that the weeping redness and soreness has flared up in the last 3 weeks.  He is hoping to have right hip replacement.  He is trying to stop smoking cigarettes.  Review of Systems: Review of Systems  Constitutional: Negative for chills, diaphoresis and fever.  Genitourinary:       As noted in HPI.  Musculoskeletal: Positive for joint pain.    Past Medical History:  Diagnosis Date  . AAA (abdominal  aortic aneurysm) ([place]) 09/2012--  monitored by dr Trula Slade   stable 5.6cm CTA abdomen 2016  . Abnormal drug screen 07/09/2016   1/2/018 - positive oxycodone, fentanyl, inapprop positive MJ - mod risk  . Allergic rhinitis   . B12 deficiency   . CAD (coronary artery disease) cardiologist-  dr Stanford Breed   x3 with stents last 2005, EF 40%, predominantly RCA by CT 2016  . Cataracts, bilateral   . Cervical spondylosis 05/2010   s/p surgery  . Charcot Marie Tooth muscular atrophy dx  [phone]   neurologist--  dr love--  type 2 per pt  . Chronic pain syndrome  [facility] with [facility] --> disagreement and transfered care to Dr Sanjuan Dame at [facility], requests PCP write Rx but f/u with pain clinic Q6-12 months  . COPD (chronic obstructive pulmonary disease) ([place]) 10/2011   minimal by PFTs  . DDD (degenerative disc disease)   . Disturbances of sensation of smell and taste    improving  . Dyspnea on exertion   . GERD (gastroesophageal reflux disease)   . Gout   . Headache   . Hepatitis    hepatitis B  . Hidradenitis    right groin  . Hidradenitis suppurativa dx 2011   goin and leg crease   followd by Lyndle Herrlich - daily bactrim, s/p intralesional steroid injection 10/2010  . Hip osteoarthritis    s/p intraarticular steroid shot (12/2012) ([place])  . History of hepatitis B 1983  . History of MI (myocardial infarction)    2000  &  2005  . History of pneumonia   . History of viral meningitis 2000  . HLD (hyperlipidemia)   . HTN (hypertension)   . Ischemic cardiomyopathy    s/p inferior MI  --  current ef per myoview 39%  . Liver cirrhosis secondary to NASH ([place]) 01/2014   by CT scan, rec virtual colonoscopy by Dr Collene Mares 06/2014  . Lumbar herniated disc   . Myocardial infarction ([place])    x2  . Nocturia more than twice per night   . Obesity   . Spinal stenosis    released from [facility].  established with [facility] (07/2013)  . T2DM (type 2 diabetes  mellitus) ([place])    ABIs WNL 2016  . Vitamin D deficiency     Social History   Tobacco Use  . Smoking status: Current Every Day Smoker    Packs/day: 1.00    Years: 57.00    Pack years: 57.00    Types: Cigarettes    Start date: 06/06/1967    Last attempt to quit: 02/20/2018    Years since quitting: 0.0  . Smokeless tobacco: Never Used  . Tobacco comment: stopped smoking a pipe in 2015  DOES SMOKE E CIG  Substance Use Topics  . Alcohol use: No    Alcohol/week: 0.0 standard drinks  . Drug use: Yes    Types: Fentanyl, Hydrocodone    Family History  Problem Relation Age of Onset  . Cancer Mother        colon  . Diabetes Mother   . Kidney disease Mother   . Aneurysm Mother        AAA  . Rheum arthritis Mother   . Charcot-Marie-Tooth disease Mother   . Heart disease Mother        before age 1  . Cancer Father        skin  . Heart attack Father   . Heart disease Father        before age 32  . Cancer Brother        skin  . Coronary artery disease Brother   . Cancer Brother        small cell lung cancer  . Aneurysm Brother        AAA  . Rheum arthritis Sister   . Rheum arthritis Brother   . Prostate cancer Neg Hx   . Bladder Cancer Neg Hx   . Kidney cancer Neg Hx     Allergies  Allergen Reactions  . Statins Shortness Of Breath    Cough, trouble breathing  .  Losartan Other (See Comments)    Causes him to have pain  . Penicillins   . Tramadol Nausea Only  . Allopurinol Nausea Only  . Baclofen Nausea And Vomiting  . Gabapentin Nausea And Vomiting    Objective: Vitals:   03/14/18 1428  BP: 122/64  Pulse: 79  Temp: 98.4 F (36.9 C)  TempSrc: Oral  Weight: 252 lb (114.3 kg)  Height: 5' 8"  (1.727 m)   Body mass index is 38.32 kg/m.  Physical Exam  Constitutional: He is oriented to person, place, and time.  His weight is down 15 pounds since January.  He is very pleasant and in good spirits.  Neurological: He is alert and oriented to person, place, and  time.  Skin: There is erythema.  He has diffuse erythema over his scrotum and in his groin bilaterally.  The area is weeping and there are some scattered satellite lesions.  He has scars from previous hidradenitis and boils but no active boils noted.  Psychiatric: He has a normal mood and affect.    Lab Results    Problem List Items Addressed This Visit      High   Candidal intertrigo    I am going to retreat him with oral fluconazole and topical nystatin and see him back in 2 weeks.      Relevant Medications   fluconazole (DIFLUCAN) 100 MG tablet   nystatin (MYCOSTATIN/NYSTOP) powder       Michel Bickers, MD [facility] for Infectious [place] [phone] pager   [phone] cell 03/14/2018, 2:57 PM

## 2018-03-14 NOTE — Assessment & Plan Note (Signed)
I am going to retreat him with oral fluconazole and topical nystatin and see him back in 2 weeks.

## 2018-03-18 ENCOUNTER — Telehealth: Payer: Self-pay | Admitting: Gastroenterology

## 2018-03-18 ENCOUNTER — Other Ambulatory Visit: Payer: Self-pay

## 2018-03-18 ENCOUNTER — Other Ambulatory Visit: Payer: Self-pay | Admitting: Student

## 2018-03-18 ENCOUNTER — Other Ambulatory Visit: Payer: Self-pay | Admitting: Gastroenterology

## 2018-03-18 DIAGNOSIS — K7460 Unspecified cirrhosis of liver: Secondary | ICD-10-CM

## 2018-03-18 DIAGNOSIS — K7031 Alcoholic cirrhosis of liver with ascites: Secondary | ICD-10-CM

## 2018-03-18 NOTE — Telephone Encounter (Signed)
[name] in scheduling left vm to get an order for a [place]. please call her at  [phone]

## 2018-03-19 ENCOUNTER — Ambulatory Visit
Admission: RE | Admit: 2018-03-19 | Discharge: 2018-03-19 | Disposition: A | Discharge status: Home / Self Care | Payer: Medicare PPO | Source: Ambulatory Visit | Attending: Gastroenterology | Admitting: Gastroenterology

## 2018-03-19 ENCOUNTER — Other Ambulatory Visit: Payer: Self-pay | Admitting: Gastroenterology

## 2018-03-19 ENCOUNTER — Ambulatory Visit: Payer: Medicare PPO | Admitting: Urology

## 2018-03-19 DIAGNOSIS — Z955 Presence of coronary angioplasty implant and graft: Secondary | ICD-10-CM | POA: Insufficient documentation

## 2018-03-19 DIAGNOSIS — E119 Type 2 diabetes mellitus without complications: Secondary | ICD-10-CM | POA: Insufficient documentation

## 2018-03-19 DIAGNOSIS — K7031 Alcoholic cirrhosis of liver with ascites: Secondary | ICD-10-CM

## 2018-03-19 DIAGNOSIS — Z880 Allergy status to penicillin: Secondary | ICD-10-CM | POA: Insufficient documentation

## 2018-03-19 DIAGNOSIS — I10 Essential (primary) hypertension: Secondary | ICD-10-CM | POA: Insufficient documentation

## 2018-03-19 DIAGNOSIS — K219 Gastro-esophageal reflux disease without esophagitis: Secondary | ICD-10-CM | POA: Insufficient documentation

## 2018-03-19 DIAGNOSIS — Z7982 Long term (current) use of aspirin: Secondary | ICD-10-CM | POA: Insufficient documentation

## 2018-03-19 DIAGNOSIS — E669 Obesity, unspecified: Secondary | ICD-10-CM | POA: Diagnosis not present

## 2018-03-19 DIAGNOSIS — Z888 Allergy status to other drugs, medicaments and biological substances status: Secondary | ICD-10-CM | POA: Insufficient documentation

## 2018-03-19 DIAGNOSIS — E559 Vitamin D deficiency, unspecified: Secondary | ICD-10-CM | POA: Diagnosis not present

## 2018-03-19 DIAGNOSIS — K7460 Unspecified cirrhosis of liver: Secondary | ICD-10-CM | POA: Diagnosis not present

## 2018-03-19 DIAGNOSIS — Z87891 Personal history of nicotine dependence: Secondary | ICD-10-CM | POA: Diagnosis not present

## 2018-03-19 DIAGNOSIS — M48061 Spinal stenosis, lumbar region without neurogenic claudication: Secondary | ICD-10-CM | POA: Diagnosis not present

## 2018-03-19 DIAGNOSIS — K7581 Nonalcoholic steatohepatitis (NASH): Secondary | ICD-10-CM | POA: Diagnosis not present

## 2018-03-19 DIAGNOSIS — I2510 Atherosclerotic heart disease of native coronary artery without angina pectoris: Secondary | ICD-10-CM | POA: Diagnosis not present

## 2018-03-19 DIAGNOSIS — I714 Abdominal aortic aneurysm, without rupture: Secondary | ICD-10-CM | POA: Diagnosis not present

## 2018-03-19 DIAGNOSIS — M109 Gout, unspecified: Secondary | ICD-10-CM | POA: Diagnosis not present

## 2018-03-19 DIAGNOSIS — Z79899 Other long term (current) drug therapy: Secondary | ICD-10-CM | POA: Diagnosis not present

## 2018-03-19 DIAGNOSIS — I252 Old myocardial infarction: Secondary | ICD-10-CM | POA: Diagnosis not present

## 2018-03-19 DIAGNOSIS — E785 Hyperlipidemia, unspecified: Secondary | ICD-10-CM | POA: Diagnosis not present

## 2018-03-19 DIAGNOSIS — J449 Chronic obstructive pulmonary disease, unspecified: Secondary | ICD-10-CM | POA: Diagnosis not present

## 2018-03-19 DIAGNOSIS — R188 Other ascites: Secondary | ICD-10-CM | POA: Diagnosis not present

## 2018-03-19 DIAGNOSIS — R7989 Other specified abnormal findings of blood chemistry: Secondary | ICD-10-CM | POA: Diagnosis not present

## 2018-03-19 DIAGNOSIS — K7469 Other cirrhosis of liver: Secondary | ICD-10-CM | POA: Diagnosis not present

## 2018-03-19 DIAGNOSIS — I255 Ischemic cardiomyopathy: Secondary | ICD-10-CM | POA: Diagnosis not present

## 2018-03-19 LAB — CBC
HEMATOCRIT: 33.9 % — AB (ref 39.0–52.0)
Hemoglobin: 10.9 g/dL — ABNORMAL LOW (ref 13.0–17.0)
MCH: 33.1 pg (ref 26.0–34.0)
MCHC: 32.2 g/dL (ref 30.0–36.0)
MCV: 103 fL — AB (ref 80.0–100.0)
Platelets: 49 10*3/uL — ABNORMAL LOW (ref 150–400)
RBC: 3.29 MIL/uL — ABNORMAL LOW (ref 4.22–5.81)
RDW: 14.3 % (ref 11.5–15.5)
WBC: 3.9 10*3/uL — ABNORMAL LOW (ref 4.0–10.5)
nRBC: 0 % (ref 0.0–0.2)

## 2018-03-19 LAB — PROTIME-INR
INR: 1.25
Prothrombin Time: 15.6 seconds — ABNORMAL HIGH (ref 11.4–15.2)

## 2018-03-19 LAB — APTT: aPTT: 36 seconds (ref 24–36)

## 2018-03-19 MED ORDER — SODIUM CHLORIDE 0.9 % IV SOLN
INTRAVENOUS | Status: DC
  Administered 2018-03-19: 09:00:00 via INTRAVENOUS

## 2018-03-19 MED ORDER — LIDOCAINE HCL (PF) 1 % IJ SOLN
INTRAMUSCULAR | Status: AC | PRN
  Administered 2018-03-19: 5 mL

## 2018-03-19 MED ORDER — FENTANYL CITRATE (PF) 100 MCG/2ML IJ SOLN
INTRAMUSCULAR | Status: AC | PRN
  Administered 2018-03-19 (×2): 50 ug via INTRAVENOUS

## 2018-03-19 MED ORDER — FENTANYL CITRATE (PF) 100 MCG/2ML IJ SOLN
INTRAMUSCULAR | Status: AC
  Filled 2018-03-19: qty 4

## 2018-03-19 MED ORDER — MIDAZOLAM HCL 2 MG/2ML IJ SOLN
INTRAMUSCULAR | Status: AC | PRN
  Administered 2018-03-19 (×2): 1 mg via INTRAVENOUS

## 2018-03-19 MED ORDER — MIDAZOLAM HCL 5 MG/5ML IJ SOLN
INTRAMUSCULAR | Status: AC
  Filled 2018-03-19: qty 5

## 2018-03-19 NOTE — Consult Note (Signed)
Chief Complaint: Cirrhosis of uncertain etiology  Referring Physician(s): Anna,Kiran  Patient Status: [facility] - Out-pt  History of Present Illness: [name] is a 62 y.o. male with past medical history significant for CAD, abdominal aortic aneurysm, COPD, GERD, gout, hypertension, hyperlipidemia and recent diagnosis of cirrhosis who presents today for potential ultrasound-guided paracentesis and ultrasound-guided liver biopsy for tissue diagnostic purposes.  The patient is accompanied by his wife who serves as no historian.  Patient admits to his baseline level of shortness of breath and intermittent chest pain and is otherwise without complaint.  Specifically, no worsening abdominal pain.  No yellowing of the skin or eyes.  No fever or chills.  No change in mental status.    Past Medical History:  Diagnosis Date  . AAA (abdominal aortic aneurysm) ([place]) 09/2012--  monitored by dr Trula Slade   stable 5.6cm CTA abdomen 2016  . Abnormal drug screen 07/09/2016   1/2/018 - positive oxycodone, fentanyl, inapprop positive MJ - mod risk  . Allergic rhinitis   . B12 deficiency   . CAD (coronary artery disease) cardiologist-  dr Stanford Breed   x3 with stents last 2005, EF 40%, predominantly RCA by CT 2016  . Cataracts, bilateral   . Cervical spondylosis 05/2010   s/p surgery  . Charcot Marie Tooth muscular atrophy dx  [phone]   neurologist--  dr love--  type 2 per pt  . Chronic pain syndrome    established with [facility] ([place]) --> disagreement and transfered care to Dr Sanjuan Dame at [facility] clinic [facility], requests PCP write Rx but f/u with pain clinic Q6-12 months  . COPD (chronic obstructive pulmonary disease) ([place]) 10/2011   minimal by PFTs  . DDD (degenerative disc disease)   . Disturbances of sensation of smell and taste    improving  . Dyspnea on exertion   . GERD (gastroesophageal reflux disease)   . Gout   . Headache   . Hepatitis    hepatitis B  . Hidradenitis     right groin  . Hidradenitis suppurativa dx 2011   goin and leg crease   followd by Lyndle Herrlich - daily bactrim, s/p intralesional steroid injection 10/2010  . Hip osteoarthritis    s/p intraarticular steroid shot (12/2012) ([place])  . History of hepatitis B 1983  . History of MI (myocardial infarction)    2000  &  2005  . History of pneumonia   . History of viral meningitis 2000  . HLD (hyperlipidemia)   . HTN (hypertension)   . Ischemic cardiomyopathy    s/p inferior MI  --  current ef per myoview 39%  . Liver cirrhosis secondary to NASH ([place]) 01/2014   by CT scan, rec virtual colonoscopy by Dr Collene Mares 06/2014  . Lumbar herniated disc   . Myocardial infarction ([place])    x2  . Nocturia more than twice per night   . Obesity   . Spinal stenosis    released from [place].  established with [facility] (07/2013)  . T2DM (type 2 diabetes mellitus) ([place])    ABIs WNL 2016  . Vitamin D deficiency     Past Surgical History:  Procedure Laterality Date  . ABDOMINAL AORTIC ENDOVASCULAR FENESTRATED STENT GRAFT N/A 11/30/2015   Procedure: ABDOMINAL AORTIC ENDOVASCULAR FENESTRATED STENT GRAFT;  Surgeon: Serafina Mitchell, MD;  Location: [facility];  Service: Vascular;  Laterality: N/A;  . ANTERIOR CERVICAL DECOMP/DISCECTOMY FUSION  01-07-2010    C4 -- C7  . CARDIAC CATHETERIZATION  03-30-2005  dr Albertine Patricia   ef 40% w/ inferior akinesis/  LM and CFX angiographically normal/  pLAD 30%/   Widely patent stents in RCA and PDA widely patent  . CARDIOVASCULAR STRESS TEST  10-23-2012  dr Stanford Breed   No ischemia/  Moderate scar in the inferior wall, otherwise normal perfusion/  LV ef 39%,  LV wall motion: inferior/ inferolateral hypokinesis  . COLONOSCOPY  05/06/2007   normal, small int hemorrhoids rpt 5 yrs due to fmhx - rec against rpt colonoscopy by Dr Collene Mares  . CORONARY ANGIOPLASTY  2000  dr Stanford Breed   PCI to RCA and PDA  . CORONARY ANGIOPLASTY WITH STENT PLACEMENT  03-19-2005  dr Gwyndolyn Saxon downey   inferior  STEMI--- DES x4 to RCA w/ balloon angioplasty and balloon angioplasty to jailed PDA ostium/  severe hypokinesis of midinferor wall, ef 50%/  dLM 20%,  mLAD 20%,  dCFX 60%  . ESOPHAGOGASTRODUODENOSCOPY  01/2017   dilated benign esophageal stenosis, portal hypertensive gastropathy Henrene Pastor)  . ESOPHAGOGASTRODUODENOSCOPY (EGD) WITH PROPOFOL N/A 02/20/2018   benign biopsy [name], MD)  . HYDRADENITIS EXCISION Right 12/31/2014   Procedure: WIDE EXCISION HIDRADENITIS GROIN; Coralie Keens, MD  . LUMBAR DISC SURGERY     L5-S1  . LUMBAR LAMINECTOMY  05-18-2010   L2--5   laminectomy/foraminotomy for stenosis Joya Salm)  . MYELOGRAM     L5-S1 and L1-2 spondylosis  . SACROILIAC JOINT INJECTION Bilateral 10/2013   [place]  . TONSILLECTOMY AND ADENOIDECTOMY  1972    Allergies: Statins; Losartan; Penicillins; Tramadol; Allopurinol; Baclofen; and Gabapentin  Medications: Prior to Admission medications   Medication Sig Start Date End Date Taking? Authorizing Provider  albuterol (PROVENTIL HFA;VENTOLIN HFA) 108 (90 Base) MCG/ACT inhaler Inhale 2 puffs into the lungs every 6 (six) hours as needed for wheezing or shortness of breath. 08/17/15  Yes Jearld Fenton, NP  albuterol (PROVENTIL) (2.5 MG/3ML) 0.083% nebulizer solution USE 1 VIAL PER NEBULIZER EVERY 6 HRS AS NEEDED FOR WHEEZING 07/19/15  Yes Ria Bush, MD  aspirin 325 MG tablet Take 1 tablet (325 mg total) by mouth daily. 07/19/15  Yes Ria Bush, MD  B Complex-C (SUPER B COMPLEX PO) Take 1 tablet by mouth daily.   Yes [provider]  cholecalciferol 2000 units TABS Take 2,000 Units by mouth daily. 04/29/16  Yes Ria Bush, MD  dicyclomine (BENTYL) 20 MG tablet TAKE 1 TABLET BY MOUTH 3 TIMES DAILY BEFORE MEALS. AS NEEDED FOR ABDOMINAL PAIN 01/08/18  Yes Ria Bush, MD  DULoxetine (CYMBALTA) 60 MG capsule TAKE 1 CAPSULE BY MOUTH EVERY DAY 03/14/18  Yes Ria Bush, MD  fentaNYL (DURAGESIC - DOSED MCG/HR) 50  MCG/HR Place 1 patch (50 mcg total) every other day onto the skin. 04/19/17  Yes Ria Bush, MD  fentaNYL (DURAGESIC) 25 MCG/HR patch Place 1 patch (25 mcg total) onto the skin every other day. Alternating with 6mg patch 03/13/17  Yes GRia Bush MD  fluconazole (DIFLUCAN) 100 MG tablet Take 1 tablet (100 mg total) by mouth daily. 03/14/18  Yes CMichel Bickers MD  fluticasone ([facility] 50 MCG/ACT nasal spray Place 2 sprays into both nostrils daily as needed for allergies.  02/09/12  Yes GRia Bush MD  folic acid (FOLVITE) 1 MG tablet TAKE 1 TABLET BY MOUTH DAILY 11/19/17  Yes GRia Bush MD  irbesartan (AVAPRO) 75 MG tablet Take 0.5 tablets (37.5 mg total) by mouth daily. 11/15/17  Yes GRia Bush MD  metoprolol succinate (TOPROL-XL) 25 MG 24 hr tablet TAKE 1/2 TABLET  BY MOUTH ONCE DAILY. 02/06/18  Yes Ria Bush, MD  Misc Natural Products [facility] ADVANCED) CAPS Take 1 capsule by mouth 2 (two) times daily.   Yes [provider]  omeprazole (PRILOSEC) 40 MG capsule Take 1 capsule (40 mg total) by mouth daily. 11/15/17  Yes Ria Bush, MD  ondansetron (ZOFRAN-ODT) 4 MG disintegrating tablet Take 1 tablet (4 mg total) by mouth every 8 (eight) hours as needed for nausea or vomiting. Day supply per insurance. 01/28/18  Yes Ria Bush, MD  oxyCODONE (ROXICODONE) 15 MG immediate release tablet Take 1 tablet (15 mg total) every 4 (four) hours as needed by mouth. 04/19/17  Yes Ria Bush, MD  polyethylene glycol powder (GLYCOLAX/MIRALAX) powder Take 17 g by mouth daily as needed for moderate constipation. 01/15/14  Yes Ria Bush, MD  Red Yeast Rice 600 MG CAPS Take 2 capsules by mouth daily.   Yes [provider]  vitamin B-12 (CYANOCOBALAMIN) 500 MCG tablet Take by mouth. 11/19/17  Yes [provider]  chlorhexidine (HIBICLENS) 4 % external liquid Apply topically daily as needed. Patient not taking: Reported on  03/19/2018 02/01/17   Ria Bush, MD  loperamide (IMODIUM) 2 MG capsule Take 1 capsule (2 mg total) by mouth 4 (four) times daily as needed for diarrhea or loose stools. Patient not taking: Reported on 03/19/2018 06/12/16   Ward, Delice Bison, DO  nicotine (NICODERM CQ) 14 mg/24hr patch Place 1 patch (14 mg total) onto the skin daily. Patient not taking: Reported on 03/19/2018 01/28/18   Ria Bush, MD  nicotine (NICODERM CQ) 7 mg/24hr patch Place 1 patch (7 mg total) onto the skin daily. 01/28/18   Ria Bush, MD  nitroGLYCERIN (NITROSTAT) 0.4 MG SL tablet Place 1 tablet (0.4 mg total) under the tongue every 5 (five) minutes as needed for chest pain. Patient not taking: Reported on 03/19/2018 10/10/12   Lelon Perla, MD  nystatin (MYCOSTATIN/NYSTOP) powder Apply topically 2 (two) times daily. Patient not taking: Reported on 03/19/2018 03/14/18   Michel Bickers, MD  traZODone (DESYREL) 50 MG tablet Take 1 tablet (50 mg total) by mouth at bedtime as needed for sleep. Patient not taking: Reported on 03/14/2018 11/21/17   Ria Bush, MD     Family History  Problem Relation Age of Onset  . Cancer Mother        colon  . Diabetes Mother   . Kidney disease Mother   . Aneurysm Mother        AAA  . Rheum arthritis Mother   . Charcot-Marie-Tooth disease Mother   . Heart disease Mother        before age 38  . Cancer Father        skin  . Heart attack Father   . Heart disease Father        before age 30  . Cancer Brother        skin  . Coronary artery disease Brother   . Cancer Brother        small cell lung cancer  . Aneurysm Brother        AAA  . Rheum arthritis Sister   . Rheum arthritis Brother   . Prostate cancer Neg Hx   . Bladder Cancer Neg Hx   . Kidney cancer Neg Hx     Social History   Socioeconomic History  . Marital status: Married    Spouse name: [name]  . Number of children: 0  . Years of education: College  .  Highest education level: Not  on file  Occupational History  . Occupation: Neurosurgeon implants, [employer]-now disability 2006    Employer: UNEMPLOYED  Social Needs  . Financial resource strain: Not on file  . Food insecurity:    Worry: Patient refused    Inability: Patient refused  . Transportation needs:    Medical: Not on file    Non-medical: Not on file  Tobacco Use  . Smoking status: Current Every Day Smoker    Packs/day: 1.00    Years: 57.00    Pack years: 57.00    Types: Cigarettes    Start date: 06/06/1967    Last attempt to quit: 02/20/2018    Years since quitting: 0.0  . Smokeless tobacco: Never Used  . Tobacco comment: stopped smoking a pipe in 2015  DOES SMOKE E CIG  Substance and Sexual Activity  . Alcohol use: No    Alcohol/week: 0.0 standard drinks  . Drug use: Yes    Types: Fentanyl, Hydrocodone  . Sexual activity: Not on file  Lifestyle  . Physical activity:    Days per week: Patient refused    Minutes per session: Patient refused  . Stress: Not on file  Relationships  . Social connections:    Talks on phone: Patient refused    Gets together: Patient refused    Attends religious service: Patient refused    Active member of club or organization: Patient refused    Attends meetings of clubs or organizations: Patient refused    Relationship status: Patient refused  Other Topics Concern  . 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  All other systems are negative.  Review of Systems  Constitutional: Negative for activity change, appetite change, fatigue and fever.  Respiratory: Positive for shortness of breath.     Cardiovascular: Positive for chest pain.  Gastrointestinal: Negative.   Skin: Negative for color change.  Psychiatric/Behavioral: Negative for confusion.    Vital Signs: BP 121/69 (BP Location: Right Arm)   Pulse 67   Temp 98.2 F (36.8 C) (Oral)   Resp 14   Ht 5' 8"  (1.727 m)   Wt 113.9 kg   SpO2 99%   BMI 38.16 kg/m   Physical Exam  Constitutional: He appears well-nourished.  Eyes: Conjunctivae are normal.  Cardiovascular: Normal rate and regular rhythm.  Pulmonary/Chest: Effort normal and breath sounds normal.  Abdominal: Soft. Bowel sounds are normal.  Psychiatric: He has a normal mood and affect.  Nursing note reviewed.   Imaging: No results found.  Labs:  CBC: Recent Labs    11/15/17 1100 02/13/18 1113 03/19/18 0905  WBC 6.4 4.0 3.9*  HGB 12.5* 11.6* 10.9*  HCT 36.8* 34.5* 33.9*  PLT 70.0* 47* 49*    COAGS: Recent Labs    02/13/18 1113  INR 1.2    BMP: Recent Labs    11/15/17 1100 12/28/17 1158 02/13/18 1114  NA 139  --  140  K 4.0  --  3.9  CL 102  --  103  CO2 32  --  24  GLUCOSE 86  --  135*  BUN 11  --  16  CALCIUM 8.9  --  8.7  CREATININE 0.83 0.80 1.03  GFRNONAA  --   --  77  GFRAA  --   --  90    LIVER FUNCTION TESTS: Recent Labs    11/15/17 1100 02/13/18 1113  BILITOT 1.0 0.9  AST 17 29  ALT 9 13  ALKPHOS 135* 149*  PROT 7.2 7.1  ALBUMIN 3.1* 3.0*    TUMOR MARKERS: No results for input(s): AFPTM, CEA, CA199, CHROMGRNA in the last 8760 hours.  Assessment and Plan:  TYSON PARKISON is a 62 y.o. male with past medical history significant for CAD, abdominal aortic aneurysm, COPD, GERD, gout, hypertension, hyperlipidemia and recent diagnosis of cirrhosis who presents today for potential ultrasound-guided paracentesis and ultrasound-guided liver biopsy for tissue diagnostic purposes.  The patient is accompanied by his wife who serves as no historian.  Patient admits to his baseline level of shortness of breath and  intermittent chest pain and is otherwise without complaint.    Risks and benefits of US guided liver biopsy was discussed with the patient including, but not limited to bleeding, infection, damage to adjacent structures or low yield requiring additional tests.  All of the patient's questions were answered, patient is agreeable to proceed. Consent signed and in chart.  Thank you for this interesting consult.  I greatly enjoyed meeting [name] and look forward to participating in their care.  A copy of this report was sent to the requesting provider on this date.  Electronically Signed: Sandi Mariscal, MD 03/19/2018, 9:22 AM   I spent a total of 15 Minutes in face to face in clinical consultation, greater than 50% of which was counseling/coordinating care for US guided liver biopsy.

## 2018-03-19 NOTE — Procedures (Signed)
Pre Procedure Dx: Cirrhosis of uncertain etiology Post Procedural Dx: Same  Technically successful US guided biopsy of right lobe of the liver.  EBL: None  No immediate complications.   Ronny Bacon, MD Pager #: [phone]

## 2018-03-19 NOTE — Telephone Encounter (Signed)
Spoke with Pamala Hurry at [facility] radiology. She has requested an order for paracentesis for [name]. She states it is needed if pt has ascites prior to liver biopsy. I have consulted with Dr. Vicente Males and faxed the order form.

## 2018-03-21 ENCOUNTER — Ambulatory Visit: Payer: Medicare PPO

## 2018-03-22 ENCOUNTER — Telehealth: Payer: Self-pay | Admitting: Family Medicine

## 2018-03-22 LAB — SURGICAL PATHOLOGY

## 2018-03-22 NOTE — Telephone Encounter (Signed)
Spoke with pt's wife, [name] (on dpr), relaying Dr. Synthia Innocent message. Verbalizes understanding.

## 2018-03-22 NOTE — Telephone Encounter (Signed)
I don't have results yet. Recommend she contact GI on Monday for results.

## 2018-03-22 NOTE — Telephone Encounter (Signed)
Copied from [place] [id number]. Topic: Quick Communication - See Telephone Encounter >> Mar 22, 2018 11:43 AM Rutherford Nail, NT wrote: CRM for notification. See Telephone encounter for: 03/22/18. Patient's wife calling to check on the results from the liver biopsy. States that she was told Dr Darnell Level would have those results by today. Please advise.  CB#: [phone]

## 2018-03-25 ENCOUNTER — Telehealth: Payer: Self-pay | Admitting: Gastroenterology

## 2018-03-25 NOTE — Telephone Encounter (Signed)
Pt wife left vm to get the Liver Biopsy results

## 2018-03-26 NOTE — Telephone Encounter (Signed)
Pt wife left vm to get biopsy rersults

## 2018-03-27 ENCOUNTER — Ambulatory Visit: Payer: Medicare PPO | Admitting: Gastroenterology

## 2018-03-27 ENCOUNTER — Ambulatory Visit (INDEPENDENT_AMBULATORY_CARE_PROVIDER_SITE_OTHER): Payer: Medicare PPO

## 2018-03-27 DIAGNOSIS — Z23 Encounter for immunization: Secondary | ICD-10-CM | POA: Diagnosis not present

## 2018-03-27 NOTE — Telephone Encounter (Signed)
Spoke with [name] and his wife regarding liver biopsy results. I have explained that I will contact [name] once Dr. Vicente Males reviews the results and gives his instructions.

## 2018-03-27 NOTE — Telephone Encounter (Signed)
Pt wife [name] calling back about liver biospy test results please give her a call back at pt GI Doctor advise her to call her primary provider for results please give her a call back at [phone]

## 2018-03-27 NOTE — Telephone Encounter (Signed)
Pt wife   Left vm she has left several messages regarding biopsy results she is upset and would like a call please

## 2018-03-29 ENCOUNTER — Other Ambulatory Visit: Payer: Self-pay | Admitting: Family Medicine

## 2018-03-29 NOTE — Telephone Encounter (Addendum)
plz notify liver biopsy showed cirrhosis of unclear etiology.  Will leave it up to GI doctor regarding follow up plan. Recommend she await their recommendations.

## 2018-03-29 NOTE — Telephone Encounter (Signed)
Spoke with pt wife, Vickii Chafe, regarding pt request for biopsy results. I explained that Dr. Vicente Males will discuss pt results during pt next office visit on 04-12-18.

## 2018-03-29 NOTE — Telephone Encounter (Signed)
Spoke with pt's wife, [name] (on dpr), relaying Dr. Synthia Innocent message. Verbalizes understanding.

## 2018-04-01 ENCOUNTER — Ambulatory Visit: Payer: Medicare PPO | Admitting: Internal Medicine

## 2018-04-10 ENCOUNTER — Encounter: Payer: Self-pay | Admitting: Urology

## 2018-04-10 ENCOUNTER — Ambulatory Visit (INDEPENDENT_AMBULATORY_CARE_PROVIDER_SITE_OTHER): Payer: Medicare PPO | Admitting: Urology

## 2018-04-10 VITALS — BP 88/62 | HR 92 | Ht 68.0 in

## 2018-04-10 DIAGNOSIS — R310 Gross hematuria: Secondary | ICD-10-CM

## 2018-04-10 NOTE — Progress Notes (Signed)
   04/10/2018 12:47 PM   [name] 09/26/1955 [id number]  Reason for visit: Follow up LUTS  HPI: I saw [name] in urology clinic today in follow-up for lower urinary tract symptoms.  He is an extremely comorbid 62 year old male with morbid obesity, AAA repair, cirrhosis, coronary artery disease, chronic pain on narcotics, and hidradenitis.  He previously followed by [name], and underwent cystoscopy last year for possible gross hematuria.  This was unremarkable except for a small hypervascular prostate.  No concerning urologic findings were seen on CT angio, and a CT urogram was not repeated.  He denies any recurrences of gross hematuria, though has noticed some amber-colored urine when he is dehydrated.  His primary urinary symptoms are urgency and frequency and nocturia.  He does consume large quantities of 7-Up during the day.  Prostate measures 28 cc on recent CT.   ROS: Please see flowsheet from today's date for complete review of systems.  Physical Exam: BP (!) 88/62 (BP Location: Right Arm, Patient Position: Sitting, Cuff Size: Large)   Pulse 92   Ht 5' 8"  (1.727 m)   BMI 38.16 kg/m    Constitutional:  Alert and oriented, No acute distress.  Obese, wheelchair-bound Respiratory: Normal respiratory effort, no increased work of breathing. GI: Abdomen is soft, nontender, nondistended, no abdominal masses Skin: No rashes, bruises or suspicious lesions. Neurologic: Grossly intact, no focal deficits, moving all 4 extremities. Psychiatric: Normal mood and affect  Assessment & Plan:   In summary, [name] is an extremely comorbid 62 year old male with mild urinary symptoms, not currently on any medications.  We discussed behavioral strategies at length including minimizing coffee and soda in the diet, as well as minimizing fluids in the evening, and timed and double voiding prior to bed..  I am hesitant to put him on any medications as he is a very high fall risk  with anticholinergics or Flomax.    Return in about 1 year (around 04/11/2019) for PVR, symptom check.  Billey Co, [facility] [address], [place], [place] [address] [phone]

## 2018-04-12 ENCOUNTER — Telehealth: Payer: Self-pay | Admitting: Gastroenterology

## 2018-04-12 ENCOUNTER — Ambulatory Visit (INDEPENDENT_AMBULATORY_CARE_PROVIDER_SITE_OTHER): Payer: Medicare PPO | Admitting: Gastroenterology

## 2018-04-12 ENCOUNTER — Encounter: Payer: Self-pay | Admitting: Gastroenterology

## 2018-04-12 VITALS — BP 117/74 | HR 90 | Ht 68.0 in | Wt 267.6 lb

## 2018-04-12 DIAGNOSIS — K7460 Unspecified cirrhosis of liver: Secondary | ICD-10-CM

## 2018-04-12 MED ORDER — LACTULOSE 20 G PO PACK: 20.0000 g | PACK | Freq: Three times a day (TID) | ORAL | 0 refills | Status: DC

## 2018-04-12 NOTE — Progress Notes (Signed)
[name] MD, MRCP(U.K) [address]  [place]  [place],  [address]  Main: [phone]  Fax: [phone]   Primary Care Physician: Ria Bush, MD  Primary Gastroenterologist:  Dr. [name]   No chief complaint on file.   HPI: [name] is a 62 y.o. male   Summary of history :  He was initially referred and seen on 02/13/18 for cirrhosis of the liver . He has been seen by [facility] in the past.. His Cirrhosis had been attriuted to NASH.He says he has had hepatitis B in the past- approximately 20 years back , says he has not been treated for the same . No tatoos, no alcohol , no military service, no blood transfusions. Used cocaine in his 20's, none recently. Weighed 370 lbs at his heaviest. Was always " big", he is not diabetic.   EGD 01/2017 -PHG, mild stricture was dilated.  RUQ USG 11/2017 : Small ascites, cirrhosis, Chronic dilation of CBD, mild dilation of PD, splenomegaly   CT angio of the abdomen 12/2017:Post endovascular repair of suprarenal abdominal aortic aneurysm without evidence of complication.Suspected hemodynamically significant stenosis involving the origin of the common hepatic artery, similar to the 12/2015 examination.Potential hemodynamically significant narrowing involving theproximal aspect of the right SFA.Findings of hepatic cirrhosis and portal venous hypertension with several varices about the gastroesophageal junction, splenomegaly and trace amount of intra-abdominal ascites. No discrete hyperenhancing hepatic lesions  02/20/18 ; EGD -normal -no eosinophils in esophagus.  Labs work 02/13/18 : Cr 1.03,Hep A ab positive , albumin 3.0 ,alk phos 149 , Hbsab/ag,HCV ab , ANA,negative .Ceruloplasmin ,alpha 1 AT,Iron studies - normal  INR 1.2 . Hb 11.6 with MCV 99 Smooth muscle antibody positive , elevated immunoglobulins G, AMA,LKM ab  negative  Interval history   03/06/2018-04/12/18   03/19/18 : liver biopsy : steatosis ,  focal plasma cells, mild chronic cholestasis. Unmable to get a diagnosis due to burned out liver disease.   Swallowing is ok   Some issues with confusion , not taking lactulose. Has ha bowel movement once every 3 days Salt : trying to cut down   Naprosyn use : still taking -stongrly suggested to stop  Weight :  stable   Is being evaluated for hematuria.  BP 117/74   Pulse 90   Ht 5' 8" (1.727 m)   Wt 267 lb 9.6 oz (121.4 kg)   BMI 40.69 kg/m    Current Outpatient Medications  Medication Sig Dispense Refill  . albuterol (PROVENTIL HFA;VENTOLIN HFA) 108 (90 Base) MCG/ACT inhaler Inhale 2 puffs into the lungs every 6 (six) hours as needed for wheezing or shortness of breath. 1 Inhaler 0  . albuterol (PROVENTIL) (2.5 MG/3ML) 0.083% nebulizer solution USE 1 VIAL PER NEBULIZER EVERY 6 HRS AS NEEDED FOR WHEEZING 75 mL 6  . aspirin 325 MG tablet Take 1 tablet (325 mg total) by mouth daily. 90 tablet 3  . B Complex-C (SUPER B COMPLEX PO) Take 1 tablet by mouth daily.    . chlorhexidine (HIBICLENS) 4 % external liquid Apply topically daily as needed. 473 mL 0  . cholecalciferol 2000 units TABS Take 2,000 Units by mouth daily.    [name] dicyclomine (BENTYL) 20 MG tablet TAKE 1 TABLET BY MOUTH 3 TIMES DAILY BEFORE MEALS. AS NEEDED FOR ABDOMINAL PAIN 30 tablet 0  . DULoxetine (CYMBALTA) 60 MG capsule TAKE 1 CAPSULE BY MOUTH EVERY DAY 30 capsule 2  . fentaNYL (DURAGESIC - DOSED MCG/HR) 50 MCG/HR Place  1 patch (50 mcg total) every other day onto the skin. 15 patch 0  . fentaNYL (DURAGESIC) 25 MCG/HR patch Place 1 patch (25 mcg total) onto the skin every other day. Alternating with 74mg patch 7 patch 0  . fluconazole (DIFLUCAN) 100 MG tablet Take 1 tablet (100 mg total) by mouth daily. 30 tablet 2  . fluticasone (FLONASE) 50 MCG/ACT nasal spray Place 2 sprays into both nostrils daily as needed for allergies.     . folic acid (FOLVITE) 1 MG tablet TAKE 1 TABLET BY MOUTH DAILY 30 tablet 6  . irbesartan  (AVAPRO) 75 MG tablet Take 0.5 tablets (37.5 mg total) by mouth daily. 15 tablet 11  . loperamide (IMODIUM) 2 MG capsule Take 1 capsule (2 mg total) by mouth 4 (four) times daily as needed for diarrhea or loose stools. 12 capsule 0  . metoprolol succinate (TOPROL-XL) 25 MG 24 hr tablet TAKE 1/2 TABLET BY MOUTH ONCE DAILY. 45 tablet 1  . Misc Natural Products (TART CHERRY ADVANCED) CAPS Take 1 capsule by mouth 2 (two) times daily.    . nicotine (NICODERM CQ) 14 mg/24hr patch Place 1 patch (14 mg total) onto the skin daily. 28 patch 0  . nicotine (NICODERM CQ) 7 mg/24hr patch Place 1 patch (7 mg total) onto the skin daily. 28 patch 0  . nitroGLYCERIN (NITROSTAT) 0.4 MG SL tablet Place 1 tablet (0.4 mg total) under the tongue every 5 (five) minutes as needed for chest pain. 25 tablet 12  . nystatin (MYCOSTATIN/NYSTOP) powder Apply topically 2 (two) times daily. 60 g 3  . omeprazole (PRILOSEC) 40 MG capsule Take 1 capsule (40 mg total) by mouth daily. 30 capsule 11  . ondansetron (ZOFRAN-ODT) 4 MG disintegrating tablet Take 1 tablet (4 mg total) by mouth every 8 (eight) hours as needed for nausea or vomiting. Day supply per insurance. 20 tablet 3  . oxyCODONE (ROXICODONE) 15 MG immediate release tablet Take 1 tablet (15 mg total) every 4 (four) hours as needed by mouth. 180 tablet 0  . polyethylene glycol powder (GLYCOLAX/MIRALAX) powder Take 17 g by mouth daily as needed for moderate constipation. 3350 g 0  . Red Yeast Rice 600 MG CAPS Take 2 capsules by mouth daily.    . traZODone (DESYREL) 50 MG tablet Take 1 tablet (50 mg total) by mouth at bedtime as needed for sleep. 30 tablet 3  . vitamin B-12 (CYANOCOBALAMIN) 500 MCG tablet Take by mouth.     No current facility-administered medications for this visit.     Allergies as of 04/12/2018 - Review Complete 04/10/2018  Allergen Reaction Noted  . Statins Shortness Of Breath 01/22/2012  . Losartan Other (See Comments) 01/30/2013  . Penicillins   02/20/2018  . Tramadol Nausea Only 03/13/2017  . Allopurinol Nausea Only 12/18/2012  . Baclofen Nausea And Vomiting 06/23/2015  . Gabapentin Nausea And Vomiting 04/21/2015    ROS:  General: Negative for anorexia, weight loss, fever, chills, fatigue, weakness. ENT: Negative for hoarseness, difficulty swallowing , nasal congestion. CV: Negative for chest pain, angina, palpitations, dyspnea on exertion, peripheral edema.  Respiratory: Negative for dyspnea at rest, dyspnea on exertion, cough, sputum, wheezing.  GI: See history of present illness. GU:  Negative for dysuria, hematuria, urinary incontinence, urinary frequency, nocturnal urination.  Endo: Negative for unusual weight change.    Physical Examination:   There were no vitals taken for this visit.  General: Well-nourished, well-developed in no acute distress.  Eyes: No icterus. Conjunctivae pink. Mouth:  Oropharyngeal mucosa moist and pink , no lesions erythema or exudate. Lungs: Clear to auscultation bilaterally. Non-labored. Heart: Regular rate and rhythm, no murmurs rubs or gallops.  Abdomen: Bowel sounds are normal, nontender, nondistended, no hepatosplenomegaly or masses, no abdominal bruits or hernia , no rebound or guarding.   Extremities: No lower extremity edema. No clubbing or deformities. Neuro: Alert and oriented x 3.  Grossly intact. Skin: Warm and dry, no jaundice.   Psych: Alert and cooperative, normal mood and affect.   Imaging Studies: US Abdomen Limited  Result Date: 03/19/2018 CLINICAL DATA:  Concern for cirrhosis and intra-abdominal ascites. Please perform ascites search ultrasound and ultrasound-guided paracentesis as indicated prior to ultrasound-guided percutaneous liver biopsy. EXAM: LIMITED ABDOMEN ULTRASOUND FOR ASCITES TECHNIQUE: Limited ultrasound survey for ascites was performed in all four abdominal quadrants. COMPARISON:  CT abdomen pelvis-12/28/2017 FINDINGS: Sonographic evaluation demonstrates  a trace amount of perihepatic ascites adjacent to the dome of the right lobe of the liver, similar to abdominal CT performed 12/2017 and too small to warrant ultrasound-guided paracentesis. No paracentesis attempted. IMPRESSION: Trace amount of perihepatic ascites adjacent to the dome of the right lobe of the liver, similar to abdominal CT performed 12/2017. No paracentesis attempted. Electronically Signed   By: Sandi Mariscal M.D.   On: 03/19/2018 11:02   US Biopsy (liver)  Result Date: 03/19/2018 INDICATION: Concern for cirrhosis. Elevated LFTs of uncertain etiology. Please from ultrasound-guided biopsy for tissue diagnostic purposes. EXAM: ULTRASOUND GUIDED LIVER BIOPSY COMPARISON:  CT abdomen pelvis-12/28/2017; ascites search ultrasound-earlier same day MEDICATIONS: None ANESTHESIA/SEDATION: Fentanyl 100 mcg IV; Versed 2 mg IV Total Moderate Sedation time: 10 minutes; The patient was continuously monitored during the procedure by the interventional radiology nurse under my direct supervision. COMPLICATIONS: None immediate. PROCEDURE: Informed written consent was obtained from the patient after a discussion of the risks, benefits and alternatives to treatment. The patient understands and consents the procedure. A timeout was performed prior to the initiation of the procedure. Ultrasound scanning was performed of the right upper abdominal quadrant and the procedure was planned. The right upper abdomen was prepped and draped in the usual sterile fashion. The overlying soft tissues were anesthetized with 1% lidocaine with epinephrine. A 17 gauge, 6.8 cm co-axial needle was advanced into a peripheral aspect of the right lobe of the liver and 3 core biopsies were obtained with an 18 gauge core device under direct ultrasound guidance. The co-axial needle track was embolized with the administration of a Gel-Foam slurry. Superficial hemostasis was obtained with manual compression. Post procedural scanning was negative  for definitive area of hemorrhage. A dressing was placed. The patient tolerated the procedure well without immediate post procedural complication. IMPRESSION: Technically successful ultrasound guided liver biopsy. Electronically Signed   By: Sandi Mariscal M.D.   On: 03/19/2018 11:03    Assessment and Plan:   [name] is a 62 y.o. y/o male *here to follow up  for liver cirrhosis. In the past been felt by his last GI in 2018 was probably NASH related, autoimmune work up shows a positive smooth muscle ab and elevated immunoglobulins  .MELD 9 . Liver biopsy is inconclusive due to burnt out liver. Some hepatic encephalopathy likely secondary to constipation.    Plan  1. Refer to [facility] hepatology to review his labs and liver biopsy to determine if they can rule out autoimmune liver disease.  2. MRI liver and pancreas in 05/2018 to screen for [facility] and evaluate dilated PD and CBD 3. Low  salt diet , low fat diet.  4. EGDto screen for varices in 2022 5. Stopped  Smoking/vaping-congratulated him   6. Latulose daily titrated to 1-2 soft bowel movements a day  7. Stop Naprosyn- still taking it.  8. Due to complete his Hepatitis immunizations  Dr [name]  MD,MRCP [facility]) Follow up in 8 weeks

## 2018-04-12 NOTE — Telephone Encounter (Signed)
ERROR

## 2018-04-29 ENCOUNTER — Telehealth: Payer: Self-pay | Admitting: Family Medicine

## 2018-04-29 NOTE — Telephone Encounter (Signed)
Wife called to request updated wheelchair prescription sent to [facility]. She is hopeful she can get this before Thursday.

## 2018-04-29 NOTE — Telephone Encounter (Signed)
Faxed rx to [facility].

## 2018-04-29 NOTE — Telephone Encounter (Signed)
Wheelchair Rx written and in Lisa's box. plz fax [facility] [place] [facility].

## 2018-05-20 DIAGNOSIS — Z716 Tobacco abuse counseling: Secondary | ICD-10-CM | POA: Diagnosis not present

## 2018-05-20 DIAGNOSIS — G894 Chronic pain syndrome: Secondary | ICD-10-CM | POA: Diagnosis not present

## 2018-05-20 DIAGNOSIS — M542 Cervicalgia: Secondary | ICD-10-CM | POA: Diagnosis not present

## 2018-05-20 DIAGNOSIS — Z79891 Long term (current) use of opiate analgesic: Secondary | ICD-10-CM | POA: Diagnosis not present

## 2018-05-20 DIAGNOSIS — M545 Low back pain: Secondary | ICD-10-CM | POA: Diagnosis not present

## 2018-05-20 DIAGNOSIS — F17200 Nicotine dependence, unspecified, uncomplicated: Secondary | ICD-10-CM | POA: Diagnosis not present

## 2018-05-20 DIAGNOSIS — Z87891 Personal history of nicotine dependence: Secondary | ICD-10-CM | POA: Diagnosis not present

## 2018-05-20 DIAGNOSIS — Z720 Tobacco use: Secondary | ICD-10-CM | POA: Diagnosis not present

## 2018-06-19 ENCOUNTER — Other Ambulatory Visit: Payer: Self-pay | Admitting: Family Medicine

## 2018-06-20 NOTE — Telephone Encounter (Signed)
Bentyl Last filled:  03/29/18, #30/0 Last OV:  01/28/18, f/u Next OV:  none

## 2018-07-02 DIAGNOSIS — G600 Hereditary motor and sensory neuropathy: Secondary | ICD-10-CM | POA: Diagnosis not present

## 2018-07-02 DIAGNOSIS — R0609 Other forms of dyspnea: Secondary | ICD-10-CM | POA: Diagnosis not present

## 2018-07-02 DIAGNOSIS — M1611 Unilateral primary osteoarthritis, right hip: Secondary | ICD-10-CM | POA: Diagnosis not present

## 2018-07-02 DIAGNOSIS — R296 Repeated falls: Secondary | ICD-10-CM | POA: Diagnosis not present

## 2018-07-03 DIAGNOSIS — E669 Obesity, unspecified: Secondary | ICD-10-CM | POA: Diagnosis not present

## 2018-07-03 DIAGNOSIS — Z6841 Body Mass Index (BMI) 40.0 and over, adult: Secondary | ICD-10-CM | POA: Diagnosis not present

## 2018-07-03 DIAGNOSIS — Z87891 Personal history of nicotine dependence: Secondary | ICD-10-CM | POA: Diagnosis not present

## 2018-07-03 DIAGNOSIS — L732 Hidradenitis suppurativa: Secondary | ICD-10-CM | POA: Diagnosis not present

## 2018-07-03 DIAGNOSIS — M1611 Unilateral primary osteoarthritis, right hip: Secondary | ICD-10-CM | POA: Diagnosis not present

## 2018-07-03 DIAGNOSIS — Z885 Allergy status to narcotic agent status: Secondary | ICD-10-CM | POA: Diagnosis not present

## 2018-07-03 DIAGNOSIS — K7460 Unspecified cirrhosis of liver: Secondary | ICD-10-CM | POA: Diagnosis not present

## 2018-07-03 DIAGNOSIS — I999 Unspecified disorder of circulatory system: Secondary | ICD-10-CM | POA: Diagnosis not present

## 2018-07-03 DIAGNOSIS — Z888 Allergy status to other drugs, medicaments and biological substances status: Secondary | ICD-10-CM | POA: Diagnosis not present

## 2018-07-08 ENCOUNTER — Other Ambulatory Visit: Payer: Self-pay | Admitting: Family Medicine

## 2018-07-08 ENCOUNTER — Ambulatory Visit: Payer: Medicare PPO | Admitting: Internal Medicine

## 2018-07-08 DIAGNOSIS — K7460 Unspecified cirrhosis of liver: Secondary | ICD-10-CM | POA: Diagnosis not present

## 2018-07-08 DIAGNOSIS — Z1159 Encounter for screening for other viral diseases: Secondary | ICD-10-CM | POA: Diagnosis not present

## 2018-07-11 DIAGNOSIS — Z720 Tobacco use: Secondary | ICD-10-CM | POA: Diagnosis not present

## 2018-07-11 DIAGNOSIS — Z87891 Personal history of nicotine dependence: Secondary | ICD-10-CM | POA: Diagnosis not present

## 2018-07-11 DIAGNOSIS — M542 Cervicalgia: Secondary | ICD-10-CM | POA: Diagnosis not present

## 2018-07-11 DIAGNOSIS — M545 Low back pain: Secondary | ICD-10-CM | POA: Diagnosis not present

## 2018-07-11 DIAGNOSIS — G894 Chronic pain syndrome: Secondary | ICD-10-CM | POA: Diagnosis not present

## 2018-07-11 DIAGNOSIS — Z79891 Long term (current) use of opiate analgesic: Secondary | ICD-10-CM | POA: Diagnosis not present

## 2018-07-11 DIAGNOSIS — Z716 Tobacco abuse counseling: Secondary | ICD-10-CM | POA: Diagnosis not present

## 2018-07-11 DIAGNOSIS — F17200 Nicotine dependence, unspecified, uncomplicated: Secondary | ICD-10-CM | POA: Diagnosis not present

## 2018-07-15 ENCOUNTER — Other Ambulatory Visit: Payer: Self-pay

## 2018-07-15 ENCOUNTER — Ambulatory Visit: Payer: Medicare PPO | Admitting: Gastroenterology

## 2018-07-15 NOTE — Progress Notes (Deleted)
Summary of history :  He was initially referred and seen on 02/13/18 for cirrhosis of the liver .He has been seen by [facility] in the past.. His Cirrhosis had been attriuted to NASH.He says he has had hepatitis B in the past- approximately 20 years back , says he has not been treated for the same . No tatoos, no alcohol , no military service, no blood transfusions. Used cocaine in his 20's, none recently. Weighed 370 lbs at his heaviest. Was always " big", he is not diabetic.   EGD 01/2017 -PHG, mild stricture was dilated.  RUQ USG 11/2017 : Small ascites, cirrhosis, Chronic dilation of CBD, mild dilation of PD, splenomegaly   CT angio of the abdomen 12/2017:Post endovascular repair of suprarenal abdominal aortic aneurysm without evidence of complication.Suspected hemodynamically significant stenosis involving the origin of the common hepatic artery, similar to the 12/2015 examination.Potential hemodynamically significant narrowing involving theproximal aspect of the right SFA.Findings of hepatic cirrhosis and portal venous hypertension with several varices about the gastroesophageal junction, splenomegaly and trace amount of intra-abdominal ascites. No discrete hyperenhancing hepatic lesions  02/20/18 ; EGD -normal -no eosinophils in esophagus.  Labs work 02/13/18 : Cr 1.03,Hep A ab positive , albumin 3.0 ,alk phos 149 , Hbsab/ag,HCV ab , ANA,negative .Ceruloplasmin ,alpha 1 AT,Iron studies - normal  INR 1.2 . Hb 11.6 with MCV 99 Smooth muscle antibody positive , elevated immunoglobulins G, AMA,LKM ab negative  Interval history10/07/2017-04/12/18   03/19/18 : liver biopsy : steatosis , focal plasma cells, mild chronic cholestasis. Unmable to get a diagnosis due to burned out liver disease.   Swallowing is ok   Some issues with confusion , not taking lactulose. Has ha bowel movement once every 3 days Salt : trying to cut down   Naprosyn use : still taking -stongrly suggested to stop   Weight : stable   Is being evaluated for hematuria.  BP 117/74   Pulse 90   Ht 5' 8"  (1.727 m)   Wt 267 lb 9.6 oz (121.4 kg)   BMI 40.69 kg/m      [name] is a 63 y.o. y/o male *here to follow upfor liver cirrhosis. In the past been felt by his last GI in 2018 was probably NASH related, autoimmune work upshows a positive smooth muscle ab and elevated immunoglobulins.MELD 9. Liver biopsy is inconclusive due to burnt out liver. Some hepatic encephalopathy likely secondary to constipation.    Plan  1.Refer to [facility] hepatology to review his labs and liver biopsy to determine if they can rule out autoimmune liver disease.  2. MRI liver and pancreas in 05/2018 to screen for [facility] and evaluate dilated PD and CBD 3. Low salt diet, low fat diet. 4. EGDtoscreen for varices in 2022 5. Stopped  Smoking/vaping-congratulated him  6. Latulose daily titrated to 1-2 soft bowel movements a day  7. Stop Naprosyn- still taking it.  8. Due to complete his Hepatitis immunizations

## 2018-07-26 DIAGNOSIS — R161 Splenomegaly, not elsewhere classified: Secondary | ICD-10-CM | POA: Diagnosis not present

## 2018-07-26 DIAGNOSIS — K7460 Unspecified cirrhosis of liver: Secondary | ICD-10-CM | POA: Diagnosis not present

## 2018-07-29 ENCOUNTER — Ambulatory Visit: Payer: 59 | Admitting: Internal Medicine

## 2018-08-02 DIAGNOSIS — R0609 Other forms of dyspnea: Secondary | ICD-10-CM | POA: Diagnosis not present

## 2018-08-02 DIAGNOSIS — M1611 Unilateral primary osteoarthritis, right hip: Secondary | ICD-10-CM | POA: Diagnosis not present

## 2018-08-02 DIAGNOSIS — R296 Repeated falls: Secondary | ICD-10-CM | POA: Diagnosis not present

## 2018-08-02 DIAGNOSIS — G600 Hereditary motor and sensory neuropathy: Secondary | ICD-10-CM | POA: Diagnosis not present

## 2018-08-05 ENCOUNTER — Other Ambulatory Visit: Payer: Self-pay | Admitting: Family Medicine

## 2018-08-07 ENCOUNTER — Encounter: Payer: Self-pay | Admitting: Family Medicine

## 2018-08-07 ENCOUNTER — Ambulatory Visit (INDEPENDENT_AMBULATORY_CARE_PROVIDER_SITE_OTHER): Payer: 59 | Admitting: Family Medicine

## 2018-08-07 VITALS — BP 134/68 | HR 60 | Temp 98.2°F | Ht 70.0 in | Wt 262.5 lb

## 2018-08-07 DIAGNOSIS — R5382 Chronic fatigue, unspecified: Secondary | ICD-10-CM | POA: Diagnosis not present

## 2018-08-07 DIAGNOSIS — G894 Chronic pain syndrome: Secondary | ICD-10-CM

## 2018-08-07 DIAGNOSIS — R296 Repeated falls: Secondary | ICD-10-CM | POA: Diagnosis not present

## 2018-08-07 DIAGNOSIS — K7460 Unspecified cirrhosis of liver: Secondary | ICD-10-CM

## 2018-08-07 DIAGNOSIS — Z01818 Encounter for other preprocedural examination: Secondary | ICD-10-CM

## 2018-08-07 DIAGNOSIS — I2510 Atherosclerotic heart disease of native coronary artery without angina pectoris: Secondary | ICD-10-CM

## 2018-08-07 DIAGNOSIS — G600 Hereditary motor and sensory neuropathy: Secondary | ICD-10-CM | POA: Diagnosis not present

## 2018-08-07 DIAGNOSIS — M1611 Unilateral primary osteoarthritis, right hip: Secondary | ICD-10-CM

## 2018-08-07 DIAGNOSIS — D696 Thrombocytopenia, unspecified: Secondary | ICD-10-CM

## 2018-08-07 DIAGNOSIS — Z789 Other specified health status: Secondary | ICD-10-CM | POA: Diagnosis not present

## 2018-08-07 DIAGNOSIS — H9190 Unspecified hearing loss, unspecified ear: Secondary | ICD-10-CM | POA: Diagnosis not present

## 2018-08-07 NOTE — Progress Notes (Signed)
BP 134/68 (BP Location: Left Arm, Patient Position: Sitting, Cuff Size: Large)   Pulse 60   Temp 98.2 F (36.8 C) (Oral)   Ht 5' 10"  (1.778 m)   Wt 262 lb 8 oz (119.1 kg)   SpO2 99%   BMI 37.66 kg/m    Hearing Screening   125Hz  250Hz  500Hz  1000Hz  2000Hz  3000Hz  4000Hz  6000Hz  8000Hz   Right ear:   20 20 20  20     Left ear:   20 25 20  20      CC: discuss home health Subjective:    Patient ID: [name], male    DOB: 13-Nov-1955, 63 y.o.   MRN: [id number]  HPI: CAMBELL STANEK is a 63 y.o. male presenting on 08/07/2018 for Hearing Problem (Pt's wife states pt is having issues hearing. Pt accompanied by his wife, [name]. ) and Hip Pain (Wants to discuss hip replacement surgery due to pain. Also, wants to discuss getting [facility] services. Told from [facility] pt just needs recommendation from PCP. )   He has stopped smoking!  End stage R hip arthritis - seeing Dr Jefferson Fuel, discussing hip replacement surgery. Requests cardiology referral for preop evaluation. Last seen 4 yrs ago ([place]).   Cirrhosis - established care with [facility] Dr Marene Lenz who tentatively agreed with possible upcoming ortho surgery.   Stable urology evaluation 04/2018, sees yearly [facility]).   Large mobility limitation due to weight, hip arthritis. Wife is struggling caring for him at home.   Lives on sister's property. They are looking into building a ramp for mobility assistance. Lives in one story trailer with add-on.   Pt and wife note hearing loss at home.      Relevant past medical, surgical, family and social history reviewed and updated as indicated. Interim medical history since our last visit reviewed. Allergies and medications reviewed and updated. Outpatient Medications Prior to Visit  Medication Sig Dispense Refill  . albuterol (PROVENTIL HFA;VENTOLIN HFA) 108 (90 Base) MCG/ACT inhaler Inhale 2 puffs into the lungs every 6 (six) hours as needed for wheezing or shortness of breath. 1 Inhaler 0  .  albuterol (PROVENTIL) (2.5 MG/3ML) 0.083% nebulizer solution USE 1 VIAL PER NEBULIZER EVERY 6 HRS AS NEEDED FOR WHEEZING 75 mL 6  . aspirin 325 MG tablet Take 1 tablet (325 mg total) by mouth daily. 90 tablet 3  . B Complex-C (SUPER B COMPLEX PO) Take 1 tablet by mouth daily.    . Ca Phosphate-Cholecalciferol [phone] MG-UNIT TABS Take by mouth.    . cholecalciferol 2000 units TABS Take 2,000 Units by mouth daily.    . Cobalamin Combinations (B-12) 1000-400 MCG SUBL Take by mouth.    . dicyclomine (BENTYL) 20 MG tablet TAKE 1 TABLET BY MOUTH 3 TIMES DAILY BEFORE MEALS. AS NEEDED FOR ABDOMINAL PAIN 30 tablet 0  . DULoxetine (CYMBALTA) 60 MG capsule TAKE 1 CAPSULE BY MOUTH EVERY DAY 30 capsule 4  . fentaNYL (DURAGESIC - DOSED MCG/HR) 50 MCG/HR Place 1 patch (50 mcg total) every other day onto the skin. 15 patch 0  . fluconazole (DIFLUCAN) 100 MG tablet Take 1 tablet (100 mg total) by mouth daily. 30 tablet 2  . fluticasone (FLONASE) 50 MCG/ACT nasal spray Place 2 sprays into both nostrils daily as needed for allergies.     . folic acid (FOLVITE) 1 MG tablet TAKE 1 TABLET BY MOUTH DAILY 30 tablet 0  . irbesartan (AVAPRO) 75 MG tablet Take 0.5 tablets (37.5 mg total) by mouth daily.  15 tablet 11  . lactulose (CEPHULAC) 20 g packet Take 1 packet (20 g total) by mouth 3 (three) times daily. 30 each 0  . lactulose (CHRONULAC) 10 GM/15ML solution     . loperamide (IMODIUM) 2 MG capsule Take 1 capsule (2 mg total) by mouth 4 (four) times daily as needed for diarrhea or loose stools. 12 capsule 0  . metoprolol succinate (TOPROL-XL) 25 MG 24 hr tablet TAKE 1/2 TABLET BY MOUTH ONCE DAILY. 45 tablet 1  . Misc Natural Products (TART CHERRY ADVANCED) CAPS Take 1 capsule by mouth 2 (two) times daily.    . nitroGLYCERIN (NITROSTAT) 0.4 MG SL tablet Place 1 tablet (0.4 mg total) under the tongue every 5 (five) minutes as needed for chest pain. 25 tablet 12  . nystatin (MYCOSTATIN/NYSTOP) powder Apply topically 2  (two) times daily. 60 g 3  . omeprazole (PRILOSEC) 40 MG capsule Take 1 capsule (40 mg total) by mouth daily. 30 capsule 11  . ondansetron (ZOFRAN-ODT) 4 MG disintegrating tablet Take 1 tablet (4 mg total) by mouth every 8 (eight) hours as needed for nausea or vomiting. Day supply per insurance. 20 tablet 3  . oxyCODONE (ROXICODONE) 15 MG immediate release tablet Take 1 tablet (15 mg total) every 4 (four) hours as needed by mouth. 180 tablet 0  . polyethylene glycol powder (GLYCOLAX/MIRALAX) powder Take 17 g by mouth daily as needed for moderate constipation. 3350 g 0  . Red Yeast Rice 600 MG CAPS Take 2 capsules by mouth daily.    . vitamin B-12 (CYANOCOBALAMIN) 500 MCG tablet Take by mouth.    . chlorhexidine (HIBICLENS) 4 % external liquid Apply topically daily as needed. 473 mL 0  . nicotine (NICODERM CQ) 14 mg/24hr patch Place 1 patch (14 mg total) onto the skin daily. 28 patch 0  . nicotine (NICODERM CQ) 7 mg/24hr patch Place 1 patch (7 mg total) onto the skin daily. 28 patch 0  . traZODone (DESYREL) 50 MG tablet Take 1 tablet (50 mg total) by mouth at bedtime as needed for sleep. 30 tablet 3   No facility-administered medications prior to visit.      Per HPI unless specifically indicated in ROS section below Review of Systems Objective:    BP 134/68 (BP Location: Left Arm, Patient Position: Sitting, Cuff Size: Large)   Pulse 60   Temp 98.2 F (36.8 C) (Oral)   Ht 5' 10"  (1.778 m)   Wt 262 lb 8 oz (119.1 kg)   SpO2 99%   BMI 37.66 kg/m   Wt Readings from Last 3 Encounters:  08/07/18 262 lb 8 oz (119.1 kg)  04/12/18 267 lb 9.6 oz (121.4 kg)  03/19/18 251 lb (113.9 kg)    Physical Exam Vitals signs and nursing note reviewed.  Constitutional:      General: He is not in acute distress.    Appearance: Normal appearance. He is ill-appearing (chronic).  HENT:     Ears:     Comments: Forgot to evaluate ears    Mouth/Throat:     Mouth: Mucous membranes are moist.  Eyes:      Extraocular Movements: Extraocular movements intact.     Pupils: Pupils are equal, round, and reactive to light.  Cardiovascular:     Rate and Rhythm: Normal rate and regular rhythm.     Pulses: Normal pulses.     Heart sounds: Normal heart sounds. No murmur.  Pulmonary:     Effort: Pulmonary effort is normal. No respiratory  distress.     Breath sounds: Normal breath sounds. No wheezing, rhonchi or rales.  Musculoskeletal:     Right lower leg: No edema.     Left lower leg: No edema.  Neurological:     Mental Status: He is alert.       Lab Results  Component Value Date   WBC 3.9 (L) 03/19/2018   HGB 10.9 (L) 03/19/2018   HCT 33.9 (L) 03/19/2018   MCV 103.0 (H) 03/19/2018   PLT 49 (L) 03/19/2018    Lab Results  Component Value Date   ALT 13 02/13/2018   AST 29 02/13/2018   ALKPHOS 149 (H) 02/13/2018   BILITOT 0.9 02/13/2018    Assessment & Plan:   Problem List Items Addressed This Visit    Thrombocytopenia ([place])    With pancytopenia - thought cirrhosis related      Recurrent falls   Relevant Orders   Ambulatory referral to [place] of right hip - Primary    End stage. Wife struggling to care for him with progressive disability from pain. Will refer to [facility] for home safety evaluation, and for social worker to assist with setting up personal care services.       Relevant Orders   Ambulatory referral to [place] loss    Endorsed by pt/wife. Good hearing screen today. Declines audiology referral at this time.       Ex-smoker for less than 1 year    congratulated on smoking cessation      Coronary atherosclerosis    Will refer back to cards for preop evaluation per pt request      Relevant Orders   Ambulatory referral to Cardiology   Cirrhosis of liver without ascites [facility])    Has established with [facility] hepatology Dr Marene Lenz, had inconclusive liver biopsy for cause of cirrhosis 03/2018      Chronic pain syndrome    Followed by  [facility] pain clinic Dr Posey Pronto. On high dose oxycodone.       Chronic fatigue   Relevant Orders   Ambulatory referral to Home Health   Charcot-Marie-Tooth disease   Relevant Orders   Ambulatory referral to [place]    Other Visit Diagnoses    Pre-op evaluation       Relevant Orders   Ambulatory referral to Cardiology       No orders of the defined types were placed in this encounter.  Orders Placed This Encounter  Procedures  . Ambulatory referral to Home Health    Referral Priority:   Routine    Referral Type:   Home Health Care    Referral Reason:   Specialty Services Required    Requested Specialty:   [place]    Number of Visits Requested:   1  . Ambulatory referral to Cardiology    Referral Priority:   Routine    Referral Type:   Consultation    Referral Reason:   Specialty Services Required    Requested Specialty:   Cardiology    Number of Visits Requested:   1    Patient Instructions  Hearing screen was ok today. We will refer you to Dr Jacalyn Lefevre office for preop evaluation for R hip replacement.  We will ask home health to come out to the house - physical therapy, nurse aid, and care manager. You may need to look into personal care services - that are private pay.    Follow up plan:  Return in about 4 months (around 12/07/2018).  Ria Bush, MD

## 2018-08-07 NOTE — Assessment & Plan Note (Addendum)
Endorsed by pt/wife. Good hearing screen today. Declines audiology referral at this time.

## 2018-08-07 NOTE — Patient Instructions (Addendum)
Hearing screen was ok today. We will refer you to Dr Jacalyn Lefevre office for preop evaluation for R hip replacement.  We will ask home health to come out to the house - physical therapy, nurse aid, and care manager. You may need to look into personal care services - that are private pay.

## 2018-08-10 ENCOUNTER — Encounter: Payer: Self-pay | Admitting: Family Medicine

## 2018-08-10 NOTE — Assessment & Plan Note (Signed)
congratulated on smoking cessation

## 2018-08-10 NOTE — Assessment & Plan Note (Signed)
Followed by [facility] pain clinic Dr Posey Pronto. On high dose oxycodone.

## 2018-08-10 NOTE — Assessment & Plan Note (Addendum)
With pancytopenia - thought cirrhosis related

## 2018-08-10 NOTE — Assessment & Plan Note (Signed)
End stage. Wife struggling to care for him with progressive disability from pain. Will refer to [facility] for home safety evaluation, and for social worker to assist with setting up personal care services.

## 2018-08-10 NOTE — Assessment & Plan Note (Addendum)
Has established with [facility] hepatology Dr Marene Lenz, had inconclusive liver biopsy for cause of cirrhosis 03/2018

## 2018-08-10 NOTE — Assessment & Plan Note (Addendum)
Will refer back to cards for preop evaluation per pt request

## 2018-08-13 ENCOUNTER — Telehealth: Payer: Self-pay | Admitting: Gastroenterology

## 2018-08-13 ENCOUNTER — Ambulatory Visit: Payer: Self-pay | Admitting: Internal Medicine

## 2018-08-13 NOTE — Telephone Encounter (Signed)
Becky from [facility] is returning [place] call please call [phone]

## 2018-08-14 ENCOUNTER — Telehealth: Payer: Self-pay | Admitting: Cardiology

## 2018-08-14 ENCOUNTER — Telehealth: Payer: Self-pay | Admitting: Family Medicine

## 2018-08-14 DIAGNOSIS — Z79891 Long term (current) use of opiate analgesic: Secondary | ICD-10-CM | POA: Diagnosis not present

## 2018-08-14 DIAGNOSIS — G600 Hereditary motor and sensory neuropathy: Secondary | ICD-10-CM | POA: Diagnosis not present

## 2018-08-14 DIAGNOSIS — Z5181 Encounter for therapeutic drug level monitoring: Secondary | ICD-10-CM | POA: Diagnosis not present

## 2018-08-14 DIAGNOSIS — R296 Repeated falls: Secondary | ICD-10-CM | POA: Diagnosis not present

## 2018-08-14 DIAGNOSIS — Z9114 Patient's other noncompliance with medication regimen: Secondary | ICD-10-CM | POA: Diagnosis not present

## 2018-08-14 DIAGNOSIS — Z6838 Body mass index (BMI) 38.0-38.9, adult: Secondary | ICD-10-CM | POA: Diagnosis not present

## 2018-08-14 DIAGNOSIS — M1711 Unilateral primary osteoarthritis, right knee: Secondary | ICD-10-CM | POA: Diagnosis not present

## 2018-08-14 DIAGNOSIS — Z87891 Personal history of nicotine dependence: Secondary | ICD-10-CM | POA: Diagnosis not present

## 2018-08-14 DIAGNOSIS — Z87440 Personal history of urinary (tract) infections: Secondary | ICD-10-CM | POA: Diagnosis not present

## 2018-08-14 DIAGNOSIS — Z7982 Long term (current) use of aspirin: Secondary | ICD-10-CM | POA: Diagnosis not present

## 2018-08-14 DIAGNOSIS — Z7951 Long term (current) use of inhaled steroids: Secondary | ICD-10-CM | POA: Diagnosis not present

## 2018-08-14 NOTE — Telephone Encounter (Signed)
Called patient, advised that this was the soonest appointment. Patient was fine with this appointment, and just asked to be notified if any openings.  Patient was thankful for the call.

## 2018-08-14 NOTE — Telephone Encounter (Signed)
Returned [employer] call regarding Mr. Bisping. Unable to contact, LVM to return call

## 2018-08-14 NOTE — Telephone Encounter (Signed)
Spoke with [name] informing her Dr. Darnell Level is giving a verbal order for PT requested.

## 2018-08-14 NOTE — Telephone Encounter (Signed)
Verbal approval for PT   2w 2 1w 2

## 2018-08-14 NOTE — Telephone Encounter (Signed)
New Message   Pt is a former [facility] Pt last seen in 2016  Pt is scheduled with Dr Stanford Breed for a new patient appt 05/15 at 2:00pm  Pt would like to be seen sooner if possible Explained to pt the policy is if he hasn't been seen in 3 years he would be considered a new patient and would need a new patient appt   Please call patient and discuss what we can do  Thank you

## 2018-08-14 NOTE — Telephone Encounter (Signed)
Agree with this. Thanks.  

## 2018-08-15 ENCOUNTER — Other Ambulatory Visit: Payer: Self-pay

## 2018-08-15 ENCOUNTER — Ambulatory Visit (INDEPENDENT_AMBULATORY_CARE_PROVIDER_SITE_OTHER): Payer: Medicare PPO | Admitting: Internal Medicine

## 2018-08-15 ENCOUNTER — Encounter: Payer: Self-pay | Admitting: Internal Medicine

## 2018-08-15 DIAGNOSIS — B372 Candidiasis of skin and nail: Secondary | ICD-10-CM | POA: Diagnosis not present

## 2018-08-15 MED ORDER — NYSTATIN 100000 UNIT/GM EX POWD: Freq: Two times a day (BID) | CUTANEOUS | 11 refills | Status: DC

## 2018-08-15 NOTE — Assessment & Plan Note (Signed)
He has chronic, active candidal intertrigo due to his large pannus and immobility.  We have instructed him on daily bathing and use of nystatin powder with gauze pads between his pannus.  He can follow-up here as needed.

## 2018-08-15 NOTE — Progress Notes (Signed)
[name] for Infectious Disease  Patient Active Problem List   Diagnosis Date Noted  . Candidal intertrigo 05/08/2017    Priority: High  . Hidradenitis 05/03/2010    Priority: High  . Hearing loss 08/07/2018  . Abdominal aortic aneurysm (AAA) without rupture ([place]) 01/02/2018  . History of MRSA infection 01/02/2018  . History of myocardial infarction 01/02/2018  . Aortic atherosclerosis ([place]) 01/01/2018  . MDD (major depressive disorder), recurrent episode, moderate ([place]) 11/17/2017  . Recurrent falls 03/13/2017  . Abnormal drug screen 07/09/2016  . Thrombocytopenia ([place]) 04/29/2016  . Anemia 04/29/2016  . Protein-calorie malnutrition ([place]) 04/29/2016  . Encounter for chronic pain management 01/04/2016  . Preop cardiovascular exam 09/01/2014  . Chronic fatigue 07/22/2014  . Esophageal dysphagia 07/22/2014  . Gross hematuria 05/19/2014  . Health maintenance examination 04/21/2014  . Advanced care planning/counseling discussion 04/21/2014  . Orthostatic hypotension 03/13/2014  . Cirrhosis of liver without ascites ([place]) 01/03/2014  . Osteoarthritis of right hip 08/16/2012  . Medicare annual wellness visit, subsequent 06/07/2012  . DOE (dyspnea on exertion) 10/06/2011  . DDD (degenerative disc disease), cervical   . Vitamin D deficiency 03/14/2011  . Skin rash 03/14/2011  . Vitamin B12 deficiency 08/05/2010  . Disturbances of sensation of smell and taste 08/02/2010  . Prediabetes 06/27/2010  . OSA (obstructive sleep apnea) 05/16/2010  . Cervical spondylosis 05/05/2010  . Severe obesity (BMI 35.0-39.9) with comorbidity ([place]) 05/03/2010  . Ex-smoker for less than 1 year 10/21/2009  . HLD (hyperlipidemia) 09/07/2009  . Gout 09/07/2009  . 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 Essential hypertension 09/07/2009  . Coronary atherosclerosis 09/07/2009  . CARDIOMYOPATHY 09/07/2009  . COPD (chronic obstructive pulmonary disease) ([place]) 09/07/2009  . Chronic  pain syndrome 09/07/2009    Patient's Medications  New Prescriptions   No medications on file  Previous Medications   ALBUTEROL (PROVENTIL HFA;VENTOLIN HFA) 108 (90 BASE) MCG/ACT INHALER    Inhale 2 puffs into the lungs every 6 (six) hours as needed for wheezing or shortness of breath.   ALBUTEROL (PROVENTIL) (2.5 MG/3ML) 0.083% NEBULIZER SOLUTION    USE 1 VIAL PER NEBULIZER EVERY 6 HRS AS NEEDED FOR WHEEZING   ASPIRIN 325 MG TABLET    Take 1 tablet (325 mg total) by mouth daily.   B COMPLEX-C (SUPER B COMPLEX PO)    Take 1 tablet by mouth daily.   CA PHOSPHATE-CHOLECALCIFEROL 226 [id number] MG-UNIT TABS    Take by mouth.   CHOLECALCIFEROL 2000 UNITS TABS    Take 2,000 Units by mouth daily.   COBALAMIN COMBINATIONS (B-12) 1000-400 MCG SUBL    Take by mouth.   DICYCLOMINE (BENTYL) 20 MG TABLET    TAKE 1 TABLET BY MOUTH 3 TIMES DAILY BEFORE MEALS. AS NEEDED FOR ABDOMINAL PAIN   DULOXETINE (CYMBALTA) 60 MG CAPSULE    TAKE 1 CAPSULE BY MOUTH EVERY DAY   FENTANYL (DURAGESIC - DOSED MCG/HR) 50 MCG/HR    Place 1 patch (50 mcg total) every other day onto the skin.   FLUTICASONE (FLONASE) 50 MCG/ACT NASAL SPRAY    Place 2 sprays into both nostrils daily as needed for allergies.    FOLIC ACID (FOLVITE) 1 MG TABLET    TAKE 1 TABLET BY MOUTH DAILY   IRBESARTAN (AVAPRO) 75 MG TABLET    Take 0.5 tablets (37.5 mg total) by mouth daily.   LACTULOSE (CEPHULAC) 20 G PACKET    Take 1 packet (20 g total) by mouth 3 (three)  times daily.   LACTULOSE (CHRONULAC) 10 GM/15ML SOLUTION       LOPERAMIDE (IMODIUM) 2 MG CAPSULE    Take 1 capsule (2 mg total) by mouth 4 (four) times daily as needed for diarrhea or loose stools.   METOPROLOL SUCCINATE (TOPROL-XL) 25 MG 24 HR TABLET    TAKE 1/2 TABLET BY MOUTH ONCE DAILY.   MISC NATURAL PRODUCTS (TART CHERRY ADVANCED) CAPS    Take 1 capsule by mouth 2 (two) times daily.   NITROGLYCERIN (NITROSTAT) 0.4 MG SL TABLET    Place 1 tablet (0.4 mg total) under the tongue every 5 (five)  minutes as needed for chest pain.   OMEPRAZOLE (PRILOSEC) 40 MG CAPSULE    Take 1 capsule (40 mg total) by mouth daily.   ONDANSETRON (ZOFRAN-ODT) 4 MG DISINTEGRATING TABLET    Take 1 tablet (4 mg total) by mouth every 8 (eight) hours as needed for nausea or vomiting. Day supply per insurance.   OXYCODONE (ROXICODONE) 15 MG IMMEDIATE RELEASE TABLET    Take 1 tablet (15 mg total) every 4 (four) hours as needed by mouth.   POLYETHYLENE GLYCOL POWDER (GLYCOLAX/MIRALAX) POWDER    Take 17 g by mouth daily as needed for moderate constipation.   RED YEAST RICE 600 MG CAPS    Take 2 capsules by mouth daily.   VITAMIN B-12 (CYANOCOBALAMIN) 500 MCG TABLET    Take by mouth.  Modified Medications   Modified Medication Previous Medication   NYSTATIN (MYCOSTATIN/NYSTOP) POWDER nystatin (MYCOSTATIN/NYSTOP) powder      Apply topically 2 (two) times daily.    Apply topically 2 (two) times daily.  Discontinued Medications   FLUCONAZOLE (DIFLUCAN) 100 MG TABLET    Take 1 tablet (100 mg total) by mouth daily.    Subjective: [name] is in for a routine follow-up visit with his wife.  He continues to have problem with red weeping areas in his right groin.  She tells me that Dr. Nedra Hai did surgery for an area of hidradenitis 2 years ago.  They are concerned that the wound never healed.  He is still trying to lose weight so that he can have hip surgery.  Review of Systems: Review of Systems  Musculoskeletal: Positive for joint pain.  Skin: Positive for itching and rash.    Past Medical History:  Diagnosis Date  . AAA (abdominal aortic aneurysm) ([place]) 09/2012--  monitored by dr Trula Slade   stable 5.6cm CTA abdomen 2016  . Abnormal drug screen 07/09/2016   1/2/018 - positive oxycodone, fentanyl, inapprop positive MJ - mod risk  . Allergic rhinitis   . B12 deficiency   . CAD (coronary artery disease) cardiologist-  dr Stanford Breed   x3 with stents last 2005, EF 40%, predominantly RCA by CT 2016  . Cataracts,  bilateral   . Cervical spondylosis 05/2010   s/p surgery  . Charcot Marie Tooth muscular atrophy dx  [phone]   neurologist--  dr love--  type 2 per pt  . Chronic pain syndrome    [facility] --> disagreement and transfered care to Dr Sanjuan Dame at [facility] clinic [facility], requests PCP write Rx but f/u with pain clinic Q6-12 months  . COPD (chronic obstructive pulmonary disease) ([place]) 10/2011   minimal by PFTs  . DDD (degenerative disc disease)   . Disturbances of sensation of smell and taste    improving  . Dyspnea on exertion   . GERD (gastroesophageal reflux disease)   . Gout   .  Headache   . Hepatitis    hepatitis B  . Hidradenitis    right groin  . Hidradenitis suppurativa dx 2011   goin and leg crease   followd by Lyndle Herrlich - daily bactrim, s/p intralesional steroid injection 10/2010  . Hip osteoarthritis    s/p intraarticular steroid shot (12/2012) ([place])  . History of hepatitis B 1983  . History of MI (myocardial infarction)    2000  &  2005  . History of pneumonia   . History of viral meningitis 2000  . HLD (hyperlipidemia)   . HTN (hypertension)   . Ischemic cardiomyopathy    s/p inferior MI  --  current ef per myoview 39%  . Liver cirrhosis secondary to NASH ([place]) 01/2014   by CT scan, rec virtual colonoscopy by Dr Collene Mares 06/2014  . Lumbar herniated disc   . Myocardial infarction ([place])    x2  . Nocturia more than twice per night   . Obesity   . Spinal stenosis    released from [place].  established with [facility] (07/2013)  . T2DM (type 2 diabetes mellitus) ([place])    ABIs WNL 2016  . Vitamin D deficiency     Social History   Tobacco Use  . Smoking status: Current Some Day Smoker    Packs/day: 1.00    Years: 57.00    Pack years: 57.00    Types: Cigarettes, E-cigarettes    Start date: 06/06/1967    Last attempt to quit: 02/20/2018    Years since quitting: 0.4  . Smokeless tobacco: Never Used  . Tobacco comment:  stopped smoking a pipe in 2015  DOES SMOKE E CIG  Substance Use Topics  . Alcohol use: No    Alcohol/week: 0.0 standard drinks  . Drug use: Yes    Types: Fentanyl, Hydrocodone    Family History  Problem Relation Age of Onset  . Cancer Mother        colon  . Diabetes Mother   . Kidney disease Mother   . Aneurysm Mother        AAA  . Rheum arthritis Mother   . Charcot-Marie-Tooth disease Mother   . Heart disease Mother        before age 35  . Cancer Father        skin  . Heart attack Father   . Heart disease Father        before age 65  . Cancer Brother        skin  . Coronary artery disease Brother   . Cancer Brother        small cell lung cancer  . Aneurysm Brother        AAA  . Rheum arthritis Sister   . Rheum arthritis Brother   . Prostate cancer Neg Hx   . Bladder Cancer Neg Hx   . Kidney cancer Neg Hx     Allergies  Allergen Reactions  . Statins Shortness Of Breath    Cough, trouble breathing Cough, trouble breathing  . Losartan Other (See Comments)    Causes him to have pain  . Penicillins   . Tramadol Nausea Only  . Allopurinol Nausea Only  . Baclofen Nausea And Vomiting  . Gabapentin Nausea And Vomiting    Objective: Vitals:   08/15/18 1116  BP: 132/79  Pulse: 75  Temp: 98 F (36.7 C)  Weight: 259 lb (117.5 kg)   Body mass index is 37.16 kg/m.  Physical Exam Constitutional:  Comments: He is pleasant and in good spirits.  He is seated in his wheelchair.  Musculoskeletal:     Comments: He has a near full assist to get him out of his chair and onto the table.  He is very weak due to the combination of his obesity, degenerative arthritis, Charcot-Marie-Tooth syndrome and deconditioning.  Skin:    Comments: His entire right groin is red and weeping fluid.  There are 2 small areas of excoriation but no active hidradenitis.     Lab Results    Problem List Items Addressed This Visit      High   Candidal intertrigo    He has chronic,  active candidal intertrigo due to his large pannus and immobility.  We have instructed him on daily bathing and use of nystatin powder with gauze pads between his pannus.  He can follow-up here as needed.      Relevant Medications   nystatin (MYCOSTATIN/NYSTOP) powder       Michel Bickers, MD [facility] for Infectious [place] [facility] [phone] pager   [phone] cell 08/15/2018, 11:59 AM

## 2018-08-16 ENCOUNTER — Telehealth: Payer: Self-pay

## 2018-08-16 NOTE — Telephone Encounter (Signed)
Noted. Ok to do. Thanks.

## 2018-08-16 NOTE — Telephone Encounter (Signed)
Left message on vm for Samantha, of [facility], informing her Dr. Darnell Level is giving the verbal order for make up PT.

## 2018-08-16 NOTE — Telephone Encounter (Signed)
Samantha PT with [facility] left v/m for verbal order to make up missed PT visit today due to pt refusing [facility] to come out today. Samantha request cb.

## 2018-08-19 DIAGNOSIS — Z7951 Long term (current) use of inhaled steroids: Secondary | ICD-10-CM | POA: Diagnosis not present

## 2018-08-19 DIAGNOSIS — G600 Hereditary motor and sensory neuropathy: Secondary | ICD-10-CM | POA: Diagnosis not present

## 2018-08-19 DIAGNOSIS — Z7982 Long term (current) use of aspirin: Secondary | ICD-10-CM | POA: Diagnosis not present

## 2018-08-19 DIAGNOSIS — Z87891 Personal history of nicotine dependence: Secondary | ICD-10-CM | POA: Diagnosis not present

## 2018-08-19 DIAGNOSIS — R296 Repeated falls: Secondary | ICD-10-CM | POA: Diagnosis not present

## 2018-08-19 DIAGNOSIS — Z79891 Long term (current) use of opiate analgesic: Secondary | ICD-10-CM | POA: Diagnosis not present

## 2018-08-19 DIAGNOSIS — M1711 Unilateral primary osteoarthritis, right knee: Secondary | ICD-10-CM | POA: Diagnosis not present

## 2018-08-19 DIAGNOSIS — Z6838 Body mass index (BMI) 38.0-38.9, adult: Secondary | ICD-10-CM | POA: Diagnosis not present

## 2018-08-20 DIAGNOSIS — R296 Repeated falls: Secondary | ICD-10-CM | POA: Diagnosis not present

## 2018-08-20 DIAGNOSIS — Z6838 Body mass index (BMI) 38.0-38.9, adult: Secondary | ICD-10-CM | POA: Diagnosis not present

## 2018-08-20 DIAGNOSIS — Z79891 Long term (current) use of opiate analgesic: Secondary | ICD-10-CM | POA: Diagnosis not present

## 2018-08-20 DIAGNOSIS — Z7951 Long term (current) use of inhaled steroids: Secondary | ICD-10-CM | POA: Diagnosis not present

## 2018-08-20 DIAGNOSIS — Z87891 Personal history of nicotine dependence: Secondary | ICD-10-CM | POA: Diagnosis not present

## 2018-08-20 DIAGNOSIS — M1711 Unilateral primary osteoarthritis, right knee: Secondary | ICD-10-CM | POA: Diagnosis not present

## 2018-08-20 DIAGNOSIS — G600 Hereditary motor and sensory neuropathy: Secondary | ICD-10-CM | POA: Diagnosis not present

## 2018-08-20 DIAGNOSIS — Z7982 Long term (current) use of aspirin: Secondary | ICD-10-CM | POA: Diagnosis not present

## 2018-08-22 DIAGNOSIS — M1711 Unilateral primary osteoarthritis, right knee: Secondary | ICD-10-CM | POA: Diagnosis not present

## 2018-08-22 DIAGNOSIS — Z7951 Long term (current) use of inhaled steroids: Secondary | ICD-10-CM | POA: Diagnosis not present

## 2018-08-22 DIAGNOSIS — Z7982 Long term (current) use of aspirin: Secondary | ICD-10-CM | POA: Diagnosis not present

## 2018-08-22 DIAGNOSIS — Z87891 Personal history of nicotine dependence: Secondary | ICD-10-CM | POA: Diagnosis not present

## 2018-08-22 DIAGNOSIS — R296 Repeated falls: Secondary | ICD-10-CM | POA: Diagnosis not present

## 2018-08-22 DIAGNOSIS — Z79891 Long term (current) use of opiate analgesic: Secondary | ICD-10-CM | POA: Diagnosis not present

## 2018-08-22 DIAGNOSIS — G600 Hereditary motor and sensory neuropathy: Secondary | ICD-10-CM | POA: Diagnosis not present

## 2018-08-22 DIAGNOSIS — Z6838 Body mass index (BMI) 38.0-38.9, adult: Secondary | ICD-10-CM | POA: Diagnosis not present

## 2018-08-26 DIAGNOSIS — R296 Repeated falls: Secondary | ICD-10-CM | POA: Diagnosis not present

## 2018-08-26 DIAGNOSIS — Z79891 Long term (current) use of opiate analgesic: Secondary | ICD-10-CM | POA: Diagnosis not present

## 2018-08-26 DIAGNOSIS — G600 Hereditary motor and sensory neuropathy: Secondary | ICD-10-CM | POA: Diagnosis not present

## 2018-08-26 DIAGNOSIS — M1711 Unilateral primary osteoarthritis, right knee: Secondary | ICD-10-CM | POA: Diagnosis not present

## 2018-08-26 DIAGNOSIS — Z87891 Personal history of nicotine dependence: Secondary | ICD-10-CM | POA: Diagnosis not present

## 2018-08-26 DIAGNOSIS — Z7982 Long term (current) use of aspirin: Secondary | ICD-10-CM | POA: Diagnosis not present

## 2018-08-26 DIAGNOSIS — Z6838 Body mass index (BMI) 38.0-38.9, adult: Secondary | ICD-10-CM | POA: Diagnosis not present

## 2018-08-26 DIAGNOSIS — Z7951 Long term (current) use of inhaled steroids: Secondary | ICD-10-CM | POA: Diagnosis not present

## 2018-08-29 DIAGNOSIS — G600 Hereditary motor and sensory neuropathy: Secondary | ICD-10-CM | POA: Diagnosis not present

## 2018-08-29 DIAGNOSIS — Z7951 Long term (current) use of inhaled steroids: Secondary | ICD-10-CM | POA: Diagnosis not present

## 2018-08-29 DIAGNOSIS — Z87891 Personal history of nicotine dependence: Secondary | ICD-10-CM | POA: Diagnosis not present

## 2018-08-29 DIAGNOSIS — R296 Repeated falls: Secondary | ICD-10-CM | POA: Diagnosis not present

## 2018-08-29 DIAGNOSIS — Z6838 Body mass index (BMI) 38.0-38.9, adult: Secondary | ICD-10-CM | POA: Diagnosis not present

## 2018-08-29 DIAGNOSIS — Z79891 Long term (current) use of opiate analgesic: Secondary | ICD-10-CM | POA: Diagnosis not present

## 2018-08-29 DIAGNOSIS — M1711 Unilateral primary osteoarthritis, right knee: Secondary | ICD-10-CM | POA: Diagnosis not present

## 2018-08-29 DIAGNOSIS — Z7982 Long term (current) use of aspirin: Secondary | ICD-10-CM | POA: Diagnosis not present

## 2018-08-31 DIAGNOSIS — R069 Unspecified abnormalities of breathing: Secondary | ICD-10-CM | POA: Diagnosis not present

## 2018-08-31 DIAGNOSIS — M1611 Unilateral primary osteoarthritis, right hip: Secondary | ICD-10-CM | POA: Diagnosis not present

## 2018-08-31 DIAGNOSIS — R296 Repeated falls: Secondary | ICD-10-CM | POA: Diagnosis not present

## 2018-08-31 DIAGNOSIS — G600 Hereditary motor and sensory neuropathy: Secondary | ICD-10-CM | POA: Diagnosis not present

## 2018-09-03 ENCOUNTER — Telehealth: Payer: Self-pay | Admitting: Family Medicine

## 2018-09-03 DIAGNOSIS — R296 Repeated falls: Secondary | ICD-10-CM | POA: Diagnosis not present

## 2018-09-03 DIAGNOSIS — G600 Hereditary motor and sensory neuropathy: Secondary | ICD-10-CM | POA: Diagnosis not present

## 2018-09-03 DIAGNOSIS — Z79891 Long term (current) use of opiate analgesic: Secondary | ICD-10-CM | POA: Diagnosis not present

## 2018-09-03 DIAGNOSIS — M1711 Unilateral primary osteoarthritis, right knee: Secondary | ICD-10-CM | POA: Diagnosis not present

## 2018-09-03 DIAGNOSIS — Z7982 Long term (current) use of aspirin: Secondary | ICD-10-CM | POA: Diagnosis not present

## 2018-09-03 DIAGNOSIS — Z87891 Personal history of nicotine dependence: Secondary | ICD-10-CM | POA: Diagnosis not present

## 2018-09-03 DIAGNOSIS — Z7951 Long term (current) use of inhaled steroids: Secondary | ICD-10-CM | POA: Diagnosis not present

## 2018-09-03 DIAGNOSIS — Z6838 Body mass index (BMI) 38.0-38.9, adult: Secondary | ICD-10-CM | POA: Diagnosis not present

## 2018-09-03 NOTE — Telephone Encounter (Signed)
Agree with this. Thank you.  

## 2018-09-03 NOTE — Telephone Encounter (Signed)
[name] Therapy [facility]  [phone]   Verbal approval to extend home health PT for  1 week 4

## 2018-09-04 ENCOUNTER — Telehealth: Payer: Self-pay | Admitting: Urology

## 2018-09-04 DIAGNOSIS — R310 Gross hematuria: Secondary | ICD-10-CM

## 2018-09-04 NOTE — Telephone Encounter (Signed)
Pt states he is having difficulty urinating and frequent urination, passing blood. Looks like "sand in his urine". Please advise.

## 2018-09-04 NOTE — Telephone Encounter (Signed)
Spoke with [place] her Dr. Darnell Level is giving the verbal order for PT requested for pt.  Verbalizes understanding.

## 2018-09-04 NOTE — Telephone Encounter (Signed)
Urinalysis and KUB is fine, will call with results.  Thanks Nickolas Madrid, MD 09/04/2018

## 2018-09-05 ENCOUNTER — Other Ambulatory Visit: Payer: Self-pay

## 2018-09-05 NOTE — Telephone Encounter (Signed)
Spoke with patients wife and patient will go for KUB and drop off urine today

## 2018-09-05 NOTE — Addendum Note (Signed)
Addended by: Tommy Rainwater on: 09/05/2018 10:41 AM   Modules accepted: Orders

## 2018-09-12 DIAGNOSIS — M542 Cervicalgia: Secondary | ICD-10-CM | POA: Diagnosis not present

## 2018-09-12 DIAGNOSIS — M545 Low back pain: Secondary | ICD-10-CM | POA: Diagnosis not present

## 2018-09-12 DIAGNOSIS — G894 Chronic pain syndrome: Secondary | ICD-10-CM | POA: Diagnosis not present

## 2018-09-12 DIAGNOSIS — Z79891 Long term (current) use of opiate analgesic: Secondary | ICD-10-CM | POA: Diagnosis not present

## 2018-09-18 ENCOUNTER — Ambulatory Visit (INDEPENDENT_AMBULATORY_CARE_PROVIDER_SITE_OTHER): Payer: 59 | Admitting: Family Medicine

## 2018-09-18 ENCOUNTER — Ambulatory Visit: Payer: Self-pay | Admitting: *Deleted

## 2018-09-18 ENCOUNTER — Encounter: Payer: Self-pay | Admitting: Family Medicine

## 2018-09-18 VITALS — BP 109/73 | HR 93 | Temp 97.9°F | Ht 70.0 in

## 2018-09-18 DIAGNOSIS — G600 Hereditary motor and sensory neuropathy: Secondary | ICD-10-CM | POA: Diagnosis not present

## 2018-09-18 DIAGNOSIS — E440 Moderate protein-calorie malnutrition: Secondary | ICD-10-CM

## 2018-09-18 DIAGNOSIS — G894 Chronic pain syndrome: Secondary | ICD-10-CM | POA: Diagnosis not present

## 2018-09-18 DIAGNOSIS — Z7982 Long term (current) use of aspirin: Secondary | ICD-10-CM | POA: Diagnosis not present

## 2018-09-18 DIAGNOSIS — Z7951 Long term (current) use of inhaled steroids: Secondary | ICD-10-CM | POA: Diagnosis not present

## 2018-09-18 DIAGNOSIS — D61818 Other pancytopenia: Secondary | ICD-10-CM

## 2018-09-18 DIAGNOSIS — Z87891 Personal history of nicotine dependence: Secondary | ICD-10-CM | POA: Diagnosis not present

## 2018-09-18 DIAGNOSIS — R410 Disorientation, unspecified: Secondary | ICD-10-CM | POA: Diagnosis not present

## 2018-09-18 DIAGNOSIS — M1711 Unilateral primary osteoarthritis, right knee: Secondary | ICD-10-CM | POA: Diagnosis not present

## 2018-09-18 DIAGNOSIS — R4182 Altered mental status, unspecified: Secondary | ICD-10-CM

## 2018-09-18 DIAGNOSIS — Z79891 Long term (current) use of opiate analgesic: Secondary | ICD-10-CM | POA: Diagnosis not present

## 2018-09-18 DIAGNOSIS — I10 Essential (primary) hypertension: Secondary | ICD-10-CM | POA: Diagnosis not present

## 2018-09-18 DIAGNOSIS — R296 Repeated falls: Secondary | ICD-10-CM

## 2018-09-18 DIAGNOSIS — R531 Weakness: Secondary | ICD-10-CM | POA: Diagnosis not present

## 2018-09-18 DIAGNOSIS — Z6838 Body mass index (BMI) 38.0-38.9, adult: Secondary | ICD-10-CM | POA: Diagnosis not present

## 2018-09-18 DIAGNOSIS — K7460 Unspecified cirrhosis of liver: Secondary | ICD-10-CM | POA: Diagnosis not present

## 2018-09-18 NOTE — Telephone Encounter (Signed)
Please disregard the corona exposure protocol. Patient's wife started the phone call with questions regarding covid-19 as his brother died one week ago with covid-19. Speaking with Katharine Look, his wife. Patient has many co morbidities she reports. Mostly concerned over his increased weakness recently with 2 falls in the last 10 days, no head injury sustained with either and no ems evaluation reported. Stated he seems mentally less aware and has increased physical weakness with his right hip needing replacement surgery but has numerous appointments to be cleared before this can occur. Denies any changes in the following-shortness of breath/no fever/no changes in his chronic smokers cough. He did not have any contact just before or while his brother was ill. Stated she is concerned about him but other than his mental and physical weakness she could not state. Reports he has chronic pain and uses fentanyl 50 mcg patch and oxycontin hcl 15 mg tabs 5 tabs daily for pain. Set up virtual visit today at 1:00 per [facility] and for 30 minutes per Robyn. Cell # [phone]. She will be present as well. Routing encounter to PCP. Reason for Disposition . COVID-19, questions about  Answer Assessment - Initial Assessment Questions 1. COVID-19 DIAGNOSIS: "Who made your Coronavirus (COVID-19) diagnosis?" "Was it confirmed by a positive lab test?" If not diagnosed by a HCP, ask "Are there lots of cases (community spread) where you live?" (See public health department website, if unsure)   * MAJOR community spread: high number of cases; numbers of cases are increasing; many people hospitalized.   * MINOR community spread: low number of cases; not increasing; few or no people hospitalized     Brother died of corona one week ago. 2. ONSET: "When did the COVID-19 symptoms start?"     April 8th. 3. WORST SYMPTOM: "What is your worst symptom?" (e.g., cough, fever, shortness of breath, muscle aches)    Body aches but not new. Chronic  from cmt. 4. COUGH: "How bad is the cough?"      Yes but it is a chronic smokers cough, has not changed. 5. FEVER: "Do you have a fever?" If so, ask: "What is your temperature, how was it measured, and when did it start?"     No, today is 97.8 forehead 6. RESPIRATORY STATUS: "Describe your breathing?" (e.g., shortness of breath, wheezing, unable to speak)     Copd and wheezes all the time.  7. BETTER-SAME-WORSE: "Are you getting better, staying the same or getting worse compared to yesterday?"  If getting worse, ask, "In what way?"    Wife feels he is worse. Had a fall one week ago.  8. HIGH RISK DISEASE: "Do you have any chronic medical problems?" (e.g., asthma, heart or lung disease, weak immune system, etc.)     Heart disease lung disease muscle disease 9. PREGNANCY: "Is there any chance you are pregnant?" "When was your last menstrual period?"    na 10. OTHER SYMPTOMS: "Do you have any other symptoms?"  (e.g., runny nose, headache, sore throat, loss of smell)      weakness  Protocols used: CORONAVIRUS (COVID-19) DIAGNOSED OR SUSPECTED-A-AH

## 2018-09-18 NOTE — Assessment & Plan Note (Addendum)
New, acutely worse over last week after recurrent falls at home. He looks sick today. Concern for developing hepatic encephalopathy and worsening anemia in setting of recurrent falls. Advised ER evaluation, he refuses. He cannot come into office due to weakness. Will see if [facility] can draw labwork and urinalysis today. Would want urinalysis, CBC, CMP, ammonia level, lipase, LDH. Advised if unable to get home health to draw labs, will recommend hospital evaluation. Pt/wife agree.

## 2018-09-18 NOTE — Progress Notes (Addendum)
Virtual visit completed through [employer]. Due to national recommendations of social distancing due to [place] 19, a virtual visit is felt to be most appropriate for this patient at this time.   Patient location: home. Wife [name] also on call.  Provider location: Financial controller at [facility], office If any vitals were documented, they were collected by patient at home unless specified below.    BP 109/73 (BP Location: Left Arm, Patient Position: Sitting, Cuff Size: Large)   Pulse 93   Temp 97.9 F (36.6 C) (Oral)   Ht 5' 10"  (1.778 m)   BMI 37.16 kg/m    CC: weakness Subjective:    Patient ID: ARNAV [id number], male    DOB: 14-Mar-1956, 64 y.o.   MRN: [id number]  HPI: [name] is a 63 y.o. male presenting on 09/18/2018 for Fatigue (Per pt's wife, [name], pt is having increased weakness. Says she has trouble trying to get him out of bed. Pt has fallen twice in last 10 days. Pt also, seems to be less mentally aware of surroundings and confused. Also, spoke wtih pt's PT, Samantha. Says pt is refusing to get out of bed. He also c/o a little burning with urination.  )   Brother died from covid19 last week. Edd Arbour has not been around brother since Christmas.   Increased weakness/fatigue over the past week. Abd pain and nauseated treating with zofran. Bruising on abdomen from several falls over the past 10 days, last was 1 wk ago, tripped over rocking chair. "I feel ok right now".   More confused recently endorsed by patient and wife. Difficult to get story from patient - he endorsed slurring of speech and facial weakness, but wife denies this. Chronic hip and back pain.   Denies fever, diarrhea.   Increased urinary difficulty and frequency and blood noted by urology phone note 09/04/2018, uro recommended KUB and UA - pt has not complete this.   Has previously seen ID for chronic candidal intertrigo, last seen last month.   [facility] involved (PT).       Relevant past medical,  surgical, family and social history reviewed and updated as indicated. Interim medical history since our last visit reviewed. Allergies and medications reviewed and updated. Outpatient Medications Prior to Visit  Medication Sig Dispense Refill  . albuterol (PROVENTIL HFA;VENTOLIN HFA) 108 (90 Base) MCG/ACT inhaler Inhale 2 puffs into the lungs every 6 (six) hours as needed for wheezing or shortness of breath. 1 Inhaler 0  . albuterol (PROVENTIL) (2.5 MG/3ML) 0.083% nebulizer solution USE 1 VIAL PER NEBULIZER EVERY 6 HRS AS NEEDED FOR WHEEZING 75 mL 6  . aspirin 325 MG tablet Take 1 tablet (325 mg total) by mouth daily. 90 tablet 3  . B Complex-C (SUPER B COMPLEX PO) Take 1 tablet by mouth daily.    . Ca Phosphate-Cholecalciferol [phone] MG-UNIT TABS Take by mouth.    . cholecalciferol 2000 units TABS Take 2,000 Units by mouth daily.    . Cobalamin Combinations (B-12) 1000-400 MCG SUBL Take by mouth.    . dicyclomine (BENTYL) 20 MG tablet TAKE 1 TABLET BY MOUTH 3 TIMES DAILY BEFORE MEALS. AS NEEDED FOR ABDOMINAL PAIN 30 tablet 0  . DULoxetine (CYMBALTA) 60 MG capsule TAKE 1 CAPSULE BY MOUTH EVERY DAY 30 capsule 4  . fentaNYL (DURAGESIC - DOSED MCG/HR) 50 MCG/HR Place 1 patch (50 mcg total) every other day onto the skin. 15 patch 0  . fluticasone (FLONASE) 50 MCG/ACT nasal spray Place 2  sprays into both nostrils daily as needed for allergies.     . folic acid (FOLVITE) 1 MG tablet TAKE 1 TABLET BY MOUTH DAILY 30 tablet 0  . irbesartan (AVAPRO) 75 MG tablet Take 0.5 tablets (37.5 mg total) by mouth daily. 15 tablet 11  . lactulose (CEPHULAC) 20 g packet Take 1 packet (20 g total) by mouth 3 (three) times daily. 30 each 0  . lactulose (CHRONULAC) 10 GM/15ML solution     . loperamide (IMODIUM) 2 MG capsule Take 1 capsule (2 mg total) by mouth 4 (four) times daily as needed for diarrhea or loose stools. 12 capsule 0  . metoprolol succinate (TOPROL-XL) 25 MG 24 hr tablet TAKE 1/2 TABLET BY MOUTH ONCE  DAILY. 45 tablet 1  . Misc Natural Products (TART CHERRY ADVANCED) CAPS Take 1 capsule by mouth 2 (two) times daily.    . nitroGLYCERIN (NITROSTAT) 0.4 MG SL tablet Place 1 tablet (0.4 mg total) under the tongue every 5 (five) minutes as needed for chest pain. 25 tablet 12  . nystatin (MYCOSTATIN/NYSTOP) powder Apply topically 2 (two) times daily. 60 g 11  . omeprazole (PRILOSEC) 40 MG capsule Take 1 capsule (40 mg total) by mouth daily. 30 capsule 11  . ondansetron (ZOFRAN-ODT) 4 MG disintegrating tablet Take 1 tablet (4 mg total) by mouth every 8 (eight) hours as needed for nausea or vomiting. Day supply per insurance. 20 tablet 3  . oxyCODONE (ROXICODONE) 15 MG immediate release tablet Take 1 tablet (15 mg total) every 4 (four) hours as needed by mouth. 180 tablet 0  . polyethylene glycol powder (GLYCOLAX/MIRALAX) powder Take 17 g by mouth daily as needed for moderate constipation. 3350 g 0  . Red Yeast Rice 600 MG CAPS Take 2 capsules by mouth daily.    . vitamin B-12 (CYANOCOBALAMIN) 500 MCG tablet Take by mouth.     No facility-administered medications prior to visit.      Per HPI unless specifically indicated in ROS section below Review of Systems Objective:    BP 109/73 (BP Location: Left Arm, Patient Position: Sitting, Cuff Size: Large)   Pulse 93   Temp 97.9 F (36.6 C) (Oral)   Ht 5' 10"  (1.778 m)   BMI 37.16 kg/m   Wt Readings from Last 3 Encounters:  08/15/18 259 lb (117.5 kg)  08/07/18 262 lb 8 oz (119.1 kg)  04/12/18 267 lb 9.6 oz (121.4 kg)     Physical exam: Gen: ill appearing, pale, in bed  Pulm: speaks in complete sentences without increased work of breathing Skin: ecchymosis to R abdomen    Lab Results  Component Value Date   WBC 3.9 (L) 03/19/2018   HGB 10.9 (L) 03/19/2018   HCT 33.9 (L) 03/19/2018   MCV 103.0 (H) 03/19/2018   PLT 49 (L) 03/19/2018    Lab Results  Component Value Date   CREATININE 1.03 02/13/2018   BUN 16 02/13/2018   NA 140  02/13/2018   K 3.9 02/13/2018   CL 103 02/13/2018   CO2 24 02/13/2018    Lab Results  Component Value Date   ALT 13 02/13/2018   AST 29 02/13/2018   ALKPHOS 149 (H) 02/13/2018   BILITOT 0.9 02/13/2018   Assessment & Plan:  Stop irbesartan for now.  Problem List Items Addressed This Visit    Recurrent falls   Protein-calorie malnutrition ([place])   Pancytopenia ([place])   General weakness - Primary    New, acutely worse over last week after  recurrent falls at home. He looks sick today. Concern for developing hepatic encephalopathy and worsening anemia in setting of recurrent falls. Advised ER evaluation, he refuses. He cannot come into office due to weakness. Will see if [facility] can draw labwork and urinalysis today. Would want urinalysis, CBC, CMP, ammonia level, lipase, LDH. Advised if unable to get home health to draw labs, will recommend hospital evaluation. Pt/wife agree.       Relevant Orders   Ambulatory referral to [place] hypertension (Chronic)    bp on the low side - advised hold irbesartan at this time.       Cirrhosis of liver without ascites ([place])   Relevant Orders   Ambulatory referral to Home Health   Chronic pain syndrome   Charcot-Marie-Tooth disease    Other Visit Diagnoses    Altered mental status, unspecified altered mental status type       Relevant Orders   Ambulatory referral to [place]       No orders of the defined types were placed in this encounter.  Orders Placed This Encounter  Procedures  . Ambulatory referral to Home Health    Referral Priority:   Routine    Referral Type:   Home Health Care    Referral Reason:   Specialty Services Required    Requested Specialty:   [place]    Number of Visits Requested:   1    Follow up plan: No follow-ups on file.  Ria Bush, MD

## 2018-09-18 NOTE — Assessment & Plan Note (Signed)
bp on the low side - advised hold irbesartan at this time.

## 2018-09-18 NOTE — Telephone Encounter (Signed)
Will see then.

## 2018-09-18 NOTE — Addendum Note (Signed)
Addended by: Ria Bush on: 09/18/2018 03:48 PM   Modules accepted: Orders

## 2018-09-19 ENCOUNTER — Telehealth: Payer: Self-pay | Admitting: Family Medicine

## 2018-09-19 ENCOUNTER — Telehealth: Payer: Self-pay

## 2018-09-19 DIAGNOSIS — Z7951 Long term (current) use of inhaled steroids: Secondary | ICD-10-CM | POA: Diagnosis not present

## 2018-09-19 DIAGNOSIS — Z7982 Long term (current) use of aspirin: Secondary | ICD-10-CM | POA: Diagnosis not present

## 2018-09-19 DIAGNOSIS — R296 Repeated falls: Secondary | ICD-10-CM | POA: Diagnosis not present

## 2018-09-19 DIAGNOSIS — Z79891 Long term (current) use of opiate analgesic: Secondary | ICD-10-CM | POA: Diagnosis not present

## 2018-09-19 DIAGNOSIS — N390 Urinary tract infection, site not specified: Secondary | ICD-10-CM | POA: Diagnosis not present

## 2018-09-19 DIAGNOSIS — Z6838 Body mass index (BMI) 38.0-38.9, adult: Secondary | ICD-10-CM | POA: Diagnosis not present

## 2018-09-19 DIAGNOSIS — M1711 Unilateral primary osteoarthritis, right knee: Secondary | ICD-10-CM | POA: Diagnosis not present

## 2018-09-19 DIAGNOSIS — G600 Hereditary motor and sensory neuropathy: Secondary | ICD-10-CM | POA: Diagnosis not present

## 2018-09-19 DIAGNOSIS — Z87891 Personal history of nicotine dependence: Secondary | ICD-10-CM | POA: Diagnosis not present

## 2018-09-19 NOTE — Telephone Encounter (Signed)
Misty nurse with [facility] request verbal orders for [facility] nursing 1 x a wk for 4 wks. Was not able to get urine specimen on 09/18/18 because pt could not urinate. Misty needs the cb for visit plan above so she can go out today to get urine specimen.Misty request cb ASAP.

## 2018-09-19 NOTE — Telephone Encounter (Signed)
Agree with this. Thanks.  

## 2018-09-19 NOTE — Telephone Encounter (Signed)
[name], [place], went to patient's home to draw Ammonia level and she was told by [employer] that it's not a test home health can do due to time restrictions. Please advise.

## 2018-09-19 NOTE — Telephone Encounter (Signed)
Best number  [phone]  Levada Dy @ [facility] Office # [phone]  Levada Dy stated is pt  bed bound and she wanted know if you could help them get pt a motorized wheel chair  Levada Dy is working with their Education officer, [facility] @ [facility]

## 2018-09-19 NOTE — Telephone Encounter (Signed)
Spoke with [place], of [facility], informing her Dr. Darnell Level is giving verbal orders for nursing services requested.  Verbalizes understanding. Says they will go back out to the pt today.

## 2018-09-20 ENCOUNTER — Telehealth: Payer: Self-pay

## 2018-09-20 NOTE — Telephone Encounter (Signed)
May try K74.60.  Thanks

## 2018-09-20 NOTE — Telephone Encounter (Addendum)
This requires an in office mobility evaluation, but we're avoiding pts coming into office at this time except for emergencies/acute illness. Could try and schedule 30 min mobility evaluation for June and would hope that by then we're able to see patients in office.

## 2018-09-20 NOTE — Telephone Encounter (Signed)
[name] from [facility] said that [employer] needs different diagnosis code for lipase; R2.96 for repeated falls was not covered. From 09/18/18 visit I suggested D61.818 pancytopenia. [name] said they would try that dx and if it does not work [name] will cb to [facility]. FYI to Dr Darnell Level.

## 2018-09-20 NOTE — Telephone Encounter (Signed)
Spoke with Sharee Pimple relaying Dr. Synthia Innocent message. Verbalizes understanding. Says she will try it.

## 2018-09-20 NOTE — Telephone Encounter (Signed)
Ok to hold off on ammonia.

## 2018-09-20 NOTE — Telephone Encounter (Signed)
Spoke with Levada Dy, of [facility], relaying Dr. Synthia Innocent message. Verbalizes understanding and thought that would be the case. Informed her we will let pt know.   Spoke with pt's wife, [name] (on dpr), scheduling pt on 11/29/18 at 10:30 AM.

## 2018-09-20 NOTE — Telephone Encounter (Signed)
Spoke with [facility] relaying Dr. Synthia Innocent message. Verbalizes understanding.

## 2018-09-23 ENCOUNTER — Telehealth: Payer: Self-pay | Admitting: Family Medicine

## 2018-09-23 DIAGNOSIS — Z6838 Body mass index (BMI) 38.0-38.9, adult: Secondary | ICD-10-CM | POA: Diagnosis not present

## 2018-09-23 DIAGNOSIS — M1711 Unilateral primary osteoarthritis, right knee: Secondary | ICD-10-CM | POA: Diagnosis not present

## 2018-09-23 DIAGNOSIS — R296 Repeated falls: Secondary | ICD-10-CM | POA: Diagnosis not present

## 2018-09-23 DIAGNOSIS — Z79891 Long term (current) use of opiate analgesic: Secondary | ICD-10-CM | POA: Diagnosis not present

## 2018-09-23 DIAGNOSIS — Z87891 Personal history of nicotine dependence: Secondary | ICD-10-CM | POA: Diagnosis not present

## 2018-09-23 DIAGNOSIS — Z7982 Long term (current) use of aspirin: Secondary | ICD-10-CM | POA: Diagnosis not present

## 2018-09-23 DIAGNOSIS — Z7951 Long term (current) use of inhaled steroids: Secondary | ICD-10-CM | POA: Diagnosis not present

## 2018-09-23 DIAGNOSIS — G600 Hereditary motor and sensory neuropathy: Secondary | ICD-10-CM | POA: Diagnosis not present

## 2018-09-23 MED ORDER — CEPHALEXIN 500 MG PO CAPS: 500.0000 mg | ORAL_CAPSULE | Freq: Two times a day (BID) | ORAL | 0 refills | Status: DC

## 2018-09-23 NOTE — Telephone Encounter (Addendum)
He is on full dose cymbalta. Hopefully treating possible UTI may help. Other option would be to add second depression medicine - but would wait to see how he does with keflex. Let us know if symptoms worsen.  How are blood pressures off irbesartan?

## 2018-09-23 NOTE — Telephone Encounter (Addendum)
plz call - labwork from last week returned showing kidneys ok, protein levels low suggesting poor nutrition. Blood counts low across the board. Urine culture returned showing bacterial infection - he has penicillin listed as allergy - so will start keflex antibiotic sent to pharmacy for 1 week.  How is he feeling today, how did he do over weekend?  [>100k GBS]

## 2018-09-23 NOTE — Telephone Encounter (Signed)
Spoke with pt's wife, [name] (on dpr), relaying results and instructions per Dr. Darnell Level.  She verbalizes understanding.  Says pt is still not wanting to eat, get out of bed or even shower.  Says pt is back to smoking 1 pk/day.  Thinks pt is more depressed due to hip pain.

## 2018-09-24 NOTE — Telephone Encounter (Signed)
Noted. Thanks.

## 2018-09-24 NOTE — Telephone Encounter (Signed)
Spoke with pt's wife, [name], relaying Dr. Synthia Innocent message. Verbalizes understanding.  Says BP has been doing fine. [name] took pt's BP during call. States it is 106/91, HR 62.

## 2018-09-25 ENCOUNTER — Other Ambulatory Visit: Payer: Medicare PPO

## 2018-09-25 DIAGNOSIS — R296 Repeated falls: Secondary | ICD-10-CM | POA: Diagnosis not present

## 2018-09-25 DIAGNOSIS — Z7982 Long term (current) use of aspirin: Secondary | ICD-10-CM | POA: Diagnosis not present

## 2018-09-25 DIAGNOSIS — Z7951 Long term (current) use of inhaled steroids: Secondary | ICD-10-CM | POA: Diagnosis not present

## 2018-09-25 DIAGNOSIS — G600 Hereditary motor and sensory neuropathy: Secondary | ICD-10-CM | POA: Diagnosis not present

## 2018-09-25 DIAGNOSIS — Z79891 Long term (current) use of opiate analgesic: Secondary | ICD-10-CM | POA: Diagnosis not present

## 2018-09-25 DIAGNOSIS — M1711 Unilateral primary osteoarthritis, right knee: Secondary | ICD-10-CM | POA: Diagnosis not present

## 2018-09-25 DIAGNOSIS — Z6838 Body mass index (BMI) 38.0-38.9, adult: Secondary | ICD-10-CM | POA: Diagnosis not present

## 2018-09-25 DIAGNOSIS — Z87891 Personal history of nicotine dependence: Secondary | ICD-10-CM | POA: Diagnosis not present

## 2018-10-01 DIAGNOSIS — R296 Repeated falls: Secondary | ICD-10-CM | POA: Diagnosis not present

## 2018-10-01 DIAGNOSIS — G600 Hereditary motor and sensory neuropathy: Secondary | ICD-10-CM | POA: Diagnosis not present

## 2018-10-01 DIAGNOSIS — Z79891 Long term (current) use of opiate analgesic: Secondary | ICD-10-CM | POA: Diagnosis not present

## 2018-10-01 DIAGNOSIS — M1711 Unilateral primary osteoarthritis, right knee: Secondary | ICD-10-CM | POA: Diagnosis not present

## 2018-10-01 DIAGNOSIS — Z87891 Personal history of nicotine dependence: Secondary | ICD-10-CM | POA: Diagnosis not present

## 2018-10-01 DIAGNOSIS — Z6838 Body mass index (BMI) 38.0-38.9, adult: Secondary | ICD-10-CM | POA: Diagnosis not present

## 2018-10-01 DIAGNOSIS — M1611 Unilateral primary osteoarthritis, right hip: Secondary | ICD-10-CM | POA: Diagnosis not present

## 2018-10-01 DIAGNOSIS — Z7982 Long term (current) use of aspirin: Secondary | ICD-10-CM | POA: Diagnosis not present

## 2018-10-01 DIAGNOSIS — Z7951 Long term (current) use of inhaled steroids: Secondary | ICD-10-CM | POA: Diagnosis not present

## 2018-10-01 DIAGNOSIS — R069 Unspecified abnormalities of breathing: Secondary | ICD-10-CM | POA: Diagnosis not present

## 2018-10-04 DIAGNOSIS — M1711 Unilateral primary osteoarthritis, right knee: Secondary | ICD-10-CM | POA: Diagnosis not present

## 2018-10-04 DIAGNOSIS — Z79891 Long term (current) use of opiate analgesic: Secondary | ICD-10-CM | POA: Diagnosis not present

## 2018-10-04 DIAGNOSIS — Z7951 Long term (current) use of inhaled steroids: Secondary | ICD-10-CM | POA: Diagnosis not present

## 2018-10-04 DIAGNOSIS — G600 Hereditary motor and sensory neuropathy: Secondary | ICD-10-CM | POA: Diagnosis not present

## 2018-10-04 DIAGNOSIS — Z7982 Long term (current) use of aspirin: Secondary | ICD-10-CM | POA: Diagnosis not present

## 2018-10-04 DIAGNOSIS — R296 Repeated falls: Secondary | ICD-10-CM | POA: Diagnosis not present

## 2018-10-04 DIAGNOSIS — Z6838 Body mass index (BMI) 38.0-38.9, adult: Secondary | ICD-10-CM | POA: Diagnosis not present

## 2018-10-04 DIAGNOSIS — Z87891 Personal history of nicotine dependence: Secondary | ICD-10-CM | POA: Diagnosis not present

## 2018-10-09 ENCOUNTER — Telehealth: Payer: Self-pay

## 2018-10-09 DIAGNOSIS — R296 Repeated falls: Secondary | ICD-10-CM | POA: Diagnosis not present

## 2018-10-09 DIAGNOSIS — Z6838 Body mass index (BMI) 38.0-38.9, adult: Secondary | ICD-10-CM | POA: Diagnosis not present

## 2018-10-09 DIAGNOSIS — M1711 Unilateral primary osteoarthritis, right knee: Secondary | ICD-10-CM | POA: Diagnosis not present

## 2018-10-09 DIAGNOSIS — Z79891 Long term (current) use of opiate analgesic: Secondary | ICD-10-CM | POA: Diagnosis not present

## 2018-10-09 DIAGNOSIS — G600 Hereditary motor and sensory neuropathy: Secondary | ICD-10-CM | POA: Diagnosis not present

## 2018-10-09 DIAGNOSIS — Z87891 Personal history of nicotine dependence: Secondary | ICD-10-CM | POA: Diagnosis not present

## 2018-10-09 DIAGNOSIS — Z7982 Long term (current) use of aspirin: Secondary | ICD-10-CM | POA: Diagnosis not present

## 2018-10-09 DIAGNOSIS — Z7951 Long term (current) use of inhaled steroids: Secondary | ICD-10-CM | POA: Diagnosis not present

## 2018-10-09 NOTE — Telephone Encounter (Signed)
Agree with this. Thank you.  

## 2018-10-09 NOTE — Telephone Encounter (Signed)
Spoke with [name] informing her Dr. Darnell Level is giving verbal order for services requested and agrees with f/u. She verbalizes understanding and will do UTI f/u next week.

## 2018-10-09 NOTE — Telephone Encounter (Signed)
Mitzi Hansen nurse with [facility] left v/m pt is up for recertification and Sonya requesting verbal orders to continue [facility] nursing 1 x every other week for 9 wks to make sure pt taking meds,FU on urine and monitor pt's confusion.Please advise.

## 2018-10-16 ENCOUNTER — Telehealth: Payer: Self-pay | Admitting: *Deleted

## 2018-10-16 DIAGNOSIS — Z6838 Body mass index (BMI) 38.0-38.9, adult: Secondary | ICD-10-CM | POA: Diagnosis not present

## 2018-10-16 DIAGNOSIS — Z87440 Personal history of urinary (tract) infections: Secondary | ICD-10-CM | POA: Diagnosis not present

## 2018-10-16 DIAGNOSIS — Z87891 Personal history of nicotine dependence: Secondary | ICD-10-CM | POA: Diagnosis not present

## 2018-10-16 DIAGNOSIS — Z7982 Long term (current) use of aspirin: Secondary | ICD-10-CM | POA: Diagnosis not present

## 2018-10-16 DIAGNOSIS — R296 Repeated falls: Secondary | ICD-10-CM | POA: Diagnosis not present

## 2018-10-16 DIAGNOSIS — Z79891 Long term (current) use of opiate analgesic: Secondary | ICD-10-CM | POA: Diagnosis not present

## 2018-10-16 DIAGNOSIS — Z5181 Encounter for therapeutic drug level monitoring: Secondary | ICD-10-CM | POA: Diagnosis not present

## 2018-10-16 DIAGNOSIS — M1711 Unilateral primary osteoarthritis, right knee: Secondary | ICD-10-CM | POA: Diagnosis not present

## 2018-10-16 DIAGNOSIS — G600 Hereditary motor and sensory neuropathy: Secondary | ICD-10-CM | POA: Diagnosis not present

## 2018-10-16 NOTE — Telephone Encounter (Signed)
Patient's wife called stating that patient stopped taking Irbesartan about 2 weeks ago and has been feeling tired since stopping the medication. Patient's wife stated that his blood pressure has been running around 110/60. Mrs. Genest stated that she will be leaving to go to work around 3:00 and requested that patient be called back. Mrs. Shingledecker wants to know what Dr. Danise Mina thinks about him feeling so tired?

## 2018-10-16 NOTE — Telephone Encounter (Signed)
Hard to tell without examining him and following labs - he had ongoing anemia with low WBC and platelets and low protein levels noted on labs last month. Irbesartan can sometimes help heart pumping function so don't know if being off this is causing symptoms.  If ongoing fatigue, would offer in-office evaluation.

## 2018-10-17 ENCOUNTER — Encounter: Payer: Self-pay | Admitting: Family Medicine

## 2018-10-17 DIAGNOSIS — N390 Urinary tract infection, site not specified: Secondary | ICD-10-CM | POA: Diagnosis not present

## 2018-10-17 NOTE — Telephone Encounter (Signed)
Left message on vm per dpr relaying Dr. Synthia Innocent message.

## 2018-10-17 NOTE — Progress Notes (Signed)
Miguel Commons MD Reason for referral-preoperative evaluation prior to hip replacement  HPI: 63 year old male with past medical history of coronary artery disease, abdominal aortic aneurysm, hypertension, DM, hyperlipidemia, COPD, cirrhosis, tobacco abuse for preoperative evaluation prior to hip replacement.  Patient seen in this office previously but not since 2016. He has had prior inferior infarct with PCI of his right coronary artery and PDA. His most recent cardiac catheterization was performed on March 30, 2005. The patient had an ejection fraction of 40% with inferior akinesis at that time. However, his stents in the right coronary artery were widely patent. Note, his most recent echocardiogram on March 30, 2005 showed normal to mildly reduced LV function. Myoview in May of 2014 showed an inferior infarct but no ischemia. Ejection fraction 39%.  Carotid Dopplers April 2016 showed no significant stenosis.  CTA July 2019 showed prior endovascular repair of suprarenal abdominal aortic aneurysm without complication.  Renal artery stents were widely patent.  There was note of hepatic cirrhosis and portal venous hypertension with varices around the gastroesophageal junction as well as splenomegaly.  Laboratories September 18, 2018 showed albumin 2.7 and platelet count 51,000.  Thrombocytopenia felt secondary to splenomegaly.  Current Outpatient Medications  Medication Sig Dispense Refill  . albuterol (PROVENTIL HFA;VENTOLIN HFA) 108 (90 Base) MCG/ACT inhaler Inhale 2 puffs into the lungs every 6 (six) hours as needed for wheezing or shortness of breath. 1 Inhaler 0  . albuterol (PROVENTIL) (2.5 MG/3ML) 0.083% nebulizer solution USE 1 VIAL PER NEBULIZER EVERY 6 HRS AS NEEDED FOR WHEEZING 75 mL 6  . aspirin 325 MG tablet Take 1 tablet (325 mg total) by mouth daily. 90 tablet 3  . B Complex-C (SUPER B COMPLEX PO) Take 1 tablet by mouth daily.    . Ca Phosphate-Cholecalciferol [phone]  MG-UNIT TABS Take by mouth.    . cholecalciferol 2000 units TABS Take 2,000 Units by mouth daily.    . Cobalamin Combinations (B-12) 1000-400 MCG SUBL Take by mouth.    . dicyclomine (BENTYL) 20 MG tablet TAKE 1 TABLET BY MOUTH 3 TIMES DAILY BEFORE MEALS. AS NEEDED FOR ABDOMINAL PAIN 30 tablet 0  . DULoxetine (CYMBALTA) 60 MG capsule TAKE 1 CAPSULE BY MOUTH EVERY DAY 30 capsule 4  . fentaNYL (DURAGESIC - DOSED MCG/HR) 50 MCG/HR Place 1 patch (50 mcg total) every other day onto the skin. 15 patch 0  . fluticasone (FLONASE) 50 MCG/ACT nasal spray Place 2 sprays into both nostrils daily as needed for allergies.     . folic acid (FOLVITE) 1 MG tablet TAKE 1 TABLET BY MOUTH DAILY 30 tablet 0  . lactulose (CEPHULAC) 20 g packet Take 1 packet (20 g total) by mouth 3 (three) times daily. 30 each 0  . lactulose (CHRONULAC) 10 GM/15ML solution     . loperamide (IMODIUM) 2 MG capsule Take 1 capsule (2 mg total) by mouth 4 (four) times daily as needed for diarrhea or loose stools. 12 capsule 0  . metoprolol succinate (TOPROL-XL) 25 MG 24 hr tablet TAKE 1/2 TABLET BY MOUTH ONCE DAILY. 45 tablet 1  . Misc Natural Products (TART CHERRY ADVANCED) CAPS Take 1 capsule by mouth 2 (two) times daily.    . nitroGLYCERIN (NITROSTAT) 0.4 MG SL tablet Place 1 tablet (0.4 mg total) under the tongue every 5 (five) minutes as needed for chest pain. 25 tablet 12  . nystatin (MYCOSTATIN/NYSTOP) powder Apply topically 2 (two) times daily. 60 g 11  . omeprazole (PRILOSEC) 40 MG capsule  Take 1 capsule (40 mg total) by mouth daily. 30 capsule 11  . ondansetron (ZOFRAN-ODT) 4 MG disintegrating tablet Take 1 tablet (4 mg total) by mouth every 8 (eight) hours as needed for nausea or vomiting. Day supply per insurance. 20 tablet 3  . oxyCODONE (ROXICODONE) 15 MG immediate release tablet Take 1 tablet (15 mg total) every 4 (four) hours as needed by mouth. 180 tablet 0  . polyethylene glycol powder (GLYCOLAX/MIRALAX) powder Take 17 g by  mouth daily as needed for moderate constipation. 3350 g 0  . Red Yeast Rice 600 MG CAPS Take 2 capsules by mouth daily.    . vitamin B-12 (CYANOCOBALAMIN) 500 MCG tablet Take by mouth.    . cephALEXin (KEFLEX) 500 MG capsule Take 1 capsule (500 mg total) by mouth 2 (two) times daily. (Patient not taking: Reported on 10/18/2018) 14 capsule 0  . irbesartan (AVAPRO) 75 MG tablet Take 0.5 tablets (37.5 mg total) by mouth daily. (Patient not taking: Reported on 10/18/2018) 15 tablet 11   No current facility-administered medications for this visit.     Allergies  Allergen Reactions  . Statins Shortness Of Breath    Cough, trouble breathing Cough, trouble breathing  . Losartan Other (See Comments)    Causes him to have pain  . Penicillins   . Tramadol Nausea Only  . Allopurinol Nausea Only  . Baclofen Nausea And Vomiting  . Gabapentin Nausea And Vomiting     Past Medical History:  Diagnosis Date  . AAA (abdominal aortic aneurysm) ([place]) 09/2012--  monitored by dr Trula Slade   stable 5.6cm CTA abdomen 2016  . Abnormal drug screen 07/09/2016   1/2/018 - positive oxycodone, fentanyl, inapprop positive MJ - mod risk  . Allergic rhinitis   . B12 deficiency   . CAD (coronary artery disease) cardiologist-  dr Stanford Breed   x3 with stents last 2005, EF 40%, predominantly RCA by CT 2016  . Cataracts, bilateral   . Cervical spondylosis 05/2010   s/p surgery  . Charcot Marie Tooth muscular atrophy dx  [phone]   neurologist--  dr love--  type 2 per pt  . Chronic pain syndrome    established with [facility] ([place]) --> disagreement and transfered care to Dr Sanjuan Dame at [facility], requests PCP write Rx but f/u with pain clinic Q6-12 months  . COPD (chronic obstructive pulmonary disease) ([place]) 10/2011   minimal by PFTs  . DDD (degenerative disc disease)   . Disturbances of sensation of smell and taste    improving  . Dyspnea on exertion   . GERD (gastroesophageal reflux disease)    . Gout   . Headache   . Hepatitis    hepatitis B  . Hidradenitis    right groin  . Hidradenitis suppurativa dx 2011   goin and leg crease   followd by Lyndle Herrlich - daily bactrim, s/p intralesional steroid injection 10/2010  . Hip osteoarthritis    s/p intraarticular steroid shot (12/2012) ([place])  . History of hepatitis B 1983  . History of MI (myocardial infarction)    2000  &  2005  . History of pneumonia   . History of viral meningitis 2000  . HLD (hyperlipidemia)   . HTN (hypertension)   . Ischemic cardiomyopathy    s/p inferior MI  --  current ef per myoview 39%  . Liver cirrhosis secondary to NASH ([place]) 01/2014   by CT scan, rec virtual colonoscopy by Dr Collene Mares 06/2014  .  Lumbar herniated disc   . Myocardial infarction ([place])    x2  . Nocturia more than twice per night   . Obesity   . Spinal stenosis    released from [place].  established with preferred pain (07/2013)  . T2DM (type 2 diabetes mellitus) ([place])    ABIs WNL 2016  . Vitamin D deficiency     Past Surgical History:  Procedure Laterality Date  . ABDOMINAL AORTIC ENDOVASCULAR FENESTRATED STENT GRAFT N/A 11/30/2015   Procedure: ABDOMINAL AORTIC ENDOVASCULAR FENESTRATED STENT GRAFT;  Surgeon: Serafina Mitchell, MD;  Location: [place];  Service: Vascular;  Laterality: N/A;  . ANTERIOR CERVICAL DECOMP/DISCECTOMY FUSION  01-07-2010    C4 -- C7  . CARDIAC CATHETERIZATION  03-30-2005  dr Albertine Patricia   ef 40% w/ inferior akinesis/  LM and CFX angiographically normal/  pLAD 30%/   Widely patent stents in RCA and PDA widely patent  . CARDIOVASCULAR STRESS TEST  10-23-2012  dr Stanford Breed   No ischemia/  Moderate scar in the inferior wall, otherwise normal perfusion/  LV ef 39%,  LV wall motion: inferior/ inferolateral hypokinesis  . COLONOSCOPY  05/06/2007   normal, small int hemorrhoids rpt 5 yrs due to fmhx - rec against rpt colonoscopy by Dr Collene Mares  . CORONARY ANGIOPLASTY  2000  dr Stanford Breed   PCI to RCA and PDA  . CORONARY  ANGIOPLASTY WITH STENT PLACEMENT  03-19-2005  dr Gwyndolyn Saxon downey   inferior STEMI--- DES x4 to RCA w/ balloon angioplasty and balloon angioplasty to jailed PDA ostium/  severe hypokinesis of midinferor wall, ef 50%/  dLM 20%,  mLAD 20%,  dCFX 60%  . ESOPHAGOGASTRODUODENOSCOPY  01/2017   dilated benign esophageal stenosis, portal hypertensive gastropathy Henrene Pastor)  . ESOPHAGOGASTRODUODENOSCOPY (EGD) WITH PROPOFOL N/A 02/20/2018   benign biopsy [name], MD)  . HYDRADENITIS EXCISION Right 12/31/2014   Procedure: WIDE EXCISION HIDRADENITIS GROIN; Coralie Keens, MD  . LUMBAR DISC SURGERY     L5-S1  . LUMBAR LAMINECTOMY  05-18-2010   L2--5   laminectomy/foraminotomy for stenosis Joya Salm)  . MYELOGRAM     L5-S1 and L1-2 spondylosis  . SACROILIAC JOINT INJECTION Bilateral 10/2013   [place]  . TONSILLECTOMY AND ADENOIDECTOMY  1972    Social History   Socioeconomic History  . Marital status: Married    Spouse name: [name]  . Number of children: 0  . Years of education: College  . Highest education level: Not on file  Occupational History  . Occupation: Neurosurgeon implants, crown and bridge-now disability 2006    Employer: UNEMPLOYED  Social Needs  . Financial resource strain: Not on file  . Food insecurity:    Worry: Patient refused    Inability: Patient refused  . Transportation needs:    Medical: Not on file    Non-medical: Not on file  Tobacco Use  . Smoking status: Current Some Day Smoker    Packs/day: 1.00    Years: 57.00    Pack years: 57.00    Types: Cigarettes, E-cigarettes    Start date: 06/06/1967    Last attempt to quit: 02/20/2018    Years since quitting: 0.6  . Smokeless tobacco: Never Used  . Tobacco comment: stopped smoking a pipe in 2015  DOES SMOKE E CIG  Substance and Sexual Activity  . Alcohol use: No    Alcohol/week: 0.0 standard drinks  . Drug use: Yes    Types: Fentanyl, Hydrocodone  . Sexual activity: Not on file  Lifestyle  . Physical  activity:    Days per week: Patient refused    Minutes per session: Patient refused  . Stress: Not on file  Relationships  . Social connections:    Talks on phone: Patient refused    Gets together: Patient refused    Attends religious service: Patient refused    Active member of club or organization: Patient refused    Attends meetings of clubs or organizations: Patient refused    Relationship status: Patient refused  . Intimate partner violence:    Fear of current or ex partner: Not on file    Emotionally abused: Not on file    Physically abused: Not on file    Forced sexual activity: Not on file  Other Topics Concern  . Not on file  Social History Narrative   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 x 5 years   caffeine: 2 cups coffee, 2 cups soda   Occupation: Neurosurgeon implants, crowne and bridge, now disability 2006   Lives with wife, 1 dog, no children    Family History  Problem Relation Age of Onset  . Cancer Mother        colon  . Diabetes Mother   . Kidney disease Mother   . Aneurysm Mother        AAA  . Rheum arthritis Mother   . Charcot-Marie-Tooth disease Mother   . Heart disease Mother        before age 11  . Cancer Father        skin  . Heart attack Father   . Heart disease Father        before age 59  . Cancer Brother        skin  . Coronary artery disease Brother   . Cancer Brother        small cell lung cancer  . Aneurysm Brother        AAA  . Rheum arthritis Sister   . Rheum arthritis Brother   . Prostate cancer Neg Hx   . Bladder Cancer Neg Hx   . Kidney cancer Neg Hx     ROS: Severe right hip pain and milder back pain but no fevers or chills, productive cough, hemoptysis, dysphasia, odynophagia, melena, hematochezia, dysuria, hematuria, rash, seizure activity, orthopnea, PND, pedal edema, claudication. Remaining systems are negative.  Physical Exam:   Blood pressure 138/72, pulse 72, temperature (!) 96.9 F (36.1 C), height 5' 8"   (1.727 m), weight 248 lb (112.5 kg), SpO2 95 %.  General:  Well developed/obese in NAD Skin warm/dry Patient not depressed No peripheral clubbing Back-normal HEENT-normal/normal eyelids Neck supple/normal carotid upstroke bilaterally; no bruits; no JVD; no thyromegaly chest - CTA/ normal expansion CV - RRR/normal S1 and S2; no murmurs, rubs or gallops;  PMI nondisplaced Abdomen -NT/ND, no HSM, no mass, + bowel sounds, no bruit 2+ femoral pulses, no bruits Ext-no edema, chords, 2+ DP Neuro-grossly nonfocal  ECG -normal sinus rhythm at a rate of 64, nonspecific ST changes.  Personally reviewed  A/P  1 preoperative evaluation prior to hip replacement-patient has very little functional capacity because of pain in hip.  We will arrange a [place] nuclear study for risk stratification.  I will also arrange an echocardiogram to assess LV function.  Given his multiple comorbidities including coronary disease, COPD, cirrhosis with thrombocytopenia his risk will be elevated for hip replacement.  However if his nuclear study is not high risk he may proceed from a cardiac standpoint.  2 coronary artery disease-continue aspirin  but decrease to 81 mg daily.  He is intolerant to statins.  3 ischemic cardiomyopathy-continue  beta-blocker; ARB on hold because of urology recommendations.  We will resume in the future as tolerated.  4 cirrhosis-follow-up gastroenterology and primary care.  5 hypertension-patient's blood pressure is controlled.  Continue present medications and follow.  6 hyperlipidemia-patient is intolerant to statins.  I will check lipids.  If LDL elevated we will consider Repatha or Zetia.  7 history of abdominal aortic aneurysm repair-followed by vascular surgery.  8 tobacco abuse-we discussed the importance of discontinuing.  Kirk Ruths MD

## 2018-10-18 ENCOUNTER — Other Ambulatory Visit: Payer: Self-pay

## 2018-10-18 ENCOUNTER — Encounter: Payer: Self-pay | Admitting: Cardiology

## 2018-10-18 ENCOUNTER — Ambulatory Visit (INDEPENDENT_AMBULATORY_CARE_PROVIDER_SITE_OTHER): Payer: Medicare PPO | Admitting: Cardiology

## 2018-10-18 VITALS — BP 138/72 | HR 72 | Temp 96.9°F | Ht 68.0 in | Wt 248.0 lb

## 2018-10-18 DIAGNOSIS — Z01810 Encounter for preprocedural cardiovascular examination: Secondary | ICD-10-CM

## 2018-10-18 DIAGNOSIS — I10 Essential (primary) hypertension: Secondary | ICD-10-CM | POA: Diagnosis not present

## 2018-10-18 DIAGNOSIS — E7800 Pure hypercholesterolemia, unspecified: Secondary | ICD-10-CM

## 2018-10-18 DIAGNOSIS — Z01818 Encounter for other preprocedural examination: Secondary | ICD-10-CM | POA: Diagnosis not present

## 2018-10-18 DIAGNOSIS — I2510 Atherosclerotic heart disease of native coronary artery without angina pectoris: Secondary | ICD-10-CM

## 2018-10-18 MED ORDER — ASPIRIN 81 MG PO TABS: 81.0000 mg | ORAL_TABLET | Freq: Every day | ORAL | Status: DC

## 2018-10-18 NOTE — Patient Instructions (Signed)
Medication Instructions:  DECREASE ASPIRIN TO 81 MG ONCE DAILY If you need a refill on your cardiac medications before your next appointment, please call your pharmacy.   Lab work: Your physician recommends that you return for lab work PRIOR TO EATING If you have labs (blood work) drawn today and your tests are completely normal, you will receive your results only by: [name] MyChart Message (if you have MyChart) OR . A paper copy in the mail If you have any lab test that is abnormal or we need to change your treatment, we will call you to review the results.  Testing/Procedures: Your physician has requested that you have a lexiscan myoview. For further information please visit [email or web address]. Please follow instruction sheet, as given.  [employer]  Your physician has requested that you have an echocardiogram. Echocardiography is a painless test that uses sound waves to create images of your heart. It provides your doctor with information about the size and shape of your heart and how well your heart's chambers and valves are working. This procedure takes approximately one hour. There are no restrictions for this procedure.  [place]  Follow-Up: At [facility], you and your health needs are our priority.  As part of our continuing mission to provide you with exceptional heart care, we have created designated Provider Care Teams.  These Care Teams include your primary Cardiologist (physician) and Advanced Practice Providers (APPs -  Physician Assistants and Nurse Practitioners) who all work together to provide you with the care you need, when you need it. You will need a follow up appointment in 6 months.  Please call our office 2 months in advance to schedule this appointment.  You may see Kirk Ruths MD or one of the following Advanced Practice Providers on your designated Care Team:   Kerin Ransom, PA-C Roby Lofts, [place] . Sande Rives, PA-C

## 2018-10-21 NOTE — Addendum Note (Signed)
Addended by: Diana Eves on: 10/21/2018 01:17 PM   Modules accepted: Orders

## 2018-10-23 ENCOUNTER — Telehealth: Payer: Self-pay | Admitting: Family Medicine

## 2018-10-23 NOTE — Telephone Encounter (Signed)
Spoke with pt's wife, [name] (on dpr), relaying Dr. Synthia Innocent message.  Verbalizes understanding.

## 2018-10-23 NOTE — Telephone Encounter (Signed)
plz notify urinalysis and culture did not show signs of ongoing infection.

## 2018-10-24 DIAGNOSIS — M542 Cervicalgia: Secondary | ICD-10-CM | POA: Diagnosis not present

## 2018-10-24 DIAGNOSIS — M545 Low back pain: Secondary | ICD-10-CM | POA: Diagnosis not present

## 2018-10-24 DIAGNOSIS — Z79891 Long term (current) use of opiate analgesic: Secondary | ICD-10-CM | POA: Diagnosis not present

## 2018-10-24 DIAGNOSIS — G894 Chronic pain syndrome: Secondary | ICD-10-CM | POA: Diagnosis not present

## 2018-10-29 ENCOUNTER — Telehealth (HOSPITAL_COMMUNITY): Payer: Self-pay | Admitting: Radiology

## 2018-10-29 NOTE — Telephone Encounter (Signed)
COVID-19 Pre-Screening Questions:  . Do you currently have a fever? No (yes = cancel and refer to pcp for e-visit) . Have you recently travelled on a cruise, internationally, or to [place], [place], [place], [place], [place], or [place], [place] [employer]) ? No (yes = cancel, stay home, monitor symptoms, and contact pcp or initiate e-visit if symptoms develop) . Have you been in contact with someone that is currently pending confirmation of Covid19 testing or has been confirmed to have the [place] virus? No-Brother passed away with COVID 19 3 weeks ago (yes = cancel, stay home, away from tested individual, monitor symptoms, and contact pcp or initiate e-visit if symptoms develop) . Are you currently experiencing fatigue or cough?No (yes = pt should be prepared to have a mask placed at the time of their visit). . Reiterated no additional visitors. Eartha Inch no earlier than 15 minutes before appointment time. . Please bring own mask.

## 2018-10-30 ENCOUNTER — Other Ambulatory Visit: Payer: Self-pay

## 2018-10-30 ENCOUNTER — Ambulatory Visit (HOSPITAL_COMMUNITY): Payer: Medicare PPO | Attending: Cardiology

## 2018-10-30 DIAGNOSIS — I2510 Atherosclerotic heart disease of native coronary artery without angina pectoris: Secondary | ICD-10-CM | POA: Diagnosis not present

## 2018-10-30 DIAGNOSIS — Z01818 Encounter for other preprocedural examination: Secondary | ICD-10-CM | POA: Diagnosis not present

## 2018-10-31 DIAGNOSIS — Z87891 Personal history of nicotine dependence: Secondary | ICD-10-CM | POA: Diagnosis not present

## 2018-10-31 DIAGNOSIS — Z5181 Encounter for therapeutic drug level monitoring: Secondary | ICD-10-CM | POA: Diagnosis not present

## 2018-10-31 DIAGNOSIS — M1611 Unilateral primary osteoarthritis, right hip: Secondary | ICD-10-CM | POA: Diagnosis not present

## 2018-10-31 DIAGNOSIS — R069 Unspecified abnormalities of breathing: Secondary | ICD-10-CM | POA: Diagnosis not present

## 2018-10-31 DIAGNOSIS — M1711 Unilateral primary osteoarthritis, right knee: Secondary | ICD-10-CM | POA: Diagnosis not present

## 2018-10-31 DIAGNOSIS — Z7982 Long term (current) use of aspirin: Secondary | ICD-10-CM | POA: Diagnosis not present

## 2018-10-31 DIAGNOSIS — Z79891 Long term (current) use of opiate analgesic: Secondary | ICD-10-CM | POA: Diagnosis not present

## 2018-10-31 DIAGNOSIS — Z87440 Personal history of urinary (tract) infections: Secondary | ICD-10-CM | POA: Diagnosis not present

## 2018-10-31 DIAGNOSIS — Z6838 Body mass index (BMI) 38.0-38.9, adult: Secondary | ICD-10-CM | POA: Diagnosis not present

## 2018-10-31 DIAGNOSIS — R296 Repeated falls: Secondary | ICD-10-CM | POA: Diagnosis not present

## 2018-10-31 DIAGNOSIS — G600 Hereditary motor and sensory neuropathy: Secondary | ICD-10-CM | POA: Diagnosis not present

## 2018-11-01 ENCOUNTER — Telehealth (HOSPITAL_COMMUNITY): Payer: Self-pay | Admitting: *Deleted

## 2018-11-01 NOTE — Telephone Encounter (Signed)
Close encounter 

## 2018-11-04 DIAGNOSIS — M25551 Pain in right hip: Secondary | ICD-10-CM | POA: Diagnosis not present

## 2018-11-04 DIAGNOSIS — M1611 Unilateral primary osteoarthritis, right hip: Secondary | ICD-10-CM | POA: Diagnosis not present

## 2018-11-05 ENCOUNTER — Other Ambulatory Visit: Payer: Self-pay

## 2018-11-05 ENCOUNTER — Ambulatory Visit (HOSPITAL_COMMUNITY)
Admission: RE | Admit: 2018-11-05 | Discharge: 2018-11-05 | Disposition: A | Discharge status: Home / Self Care | Payer: Medicare PPO | Source: Ambulatory Visit | Attending: Cardiology | Admitting: Cardiology

## 2018-11-05 DIAGNOSIS — Z01818 Encounter for other preprocedural examination: Secondary | ICD-10-CM | POA: Diagnosis not present

## 2018-11-05 DIAGNOSIS — I2510 Atherosclerotic heart disease of native coronary artery without angina pectoris: Secondary | ICD-10-CM | POA: Diagnosis not present

## 2018-11-05 LAB — MYOCARDIAL PERFUSION IMAGING
LV dias vol: 224 mL (ref 62–150)
LV sys vol: 132 mL
Peak HR: 80 {beats}/min
Rest HR: 62 {beats}/min
SDS: 3
SRS: 9
SSS: 12
TID: 1.09

## 2018-11-05 MED ORDER — REGADENOSON 0.4 MG/5ML IV SOLN
0.4000 mg | Freq: Once | INTRAVENOUS | Status: AC
  Administered 2018-11-05: 0.4 mg via INTRAVENOUS

## 2018-11-05 MED ORDER — TECHNETIUM TC 99M TETROFOSMIN IV KIT
10.2000 | PACK | Freq: Once | INTRAVENOUS | Status: AC | PRN
  Administered 2018-11-05: 10.2 via INTRAVENOUS
  Filled 2018-11-05: qty 11

## 2018-11-05 MED ORDER — TECHNETIUM TC 99M TETROFOSMIN IV KIT
30.6000 | PACK | Freq: Once | INTRAVENOUS | Status: AC | PRN
  Administered 2018-11-05: 30.6 via INTRAVENOUS
  Filled 2018-11-05: qty 31

## 2018-11-07 ENCOUNTER — Telehealth: Payer: Self-pay | Admitting: Cardiology

## 2018-11-07 NOTE — Telephone Encounter (Signed)
° ° °  Please return call to spouse with stress test results

## 2018-11-07 NOTE — Telephone Encounter (Signed)
Notes recorded by Lelon Perla, MD on 11/05/2018 at 3:39 PM EDT No ischemia; medical therapy; ok for surgery Kirk Ruths  Wife called with results.

## 2018-11-08 ENCOUNTER — Encounter: Payer: Self-pay | Admitting: Family Medicine

## 2018-11-08 DIAGNOSIS — M1611 Unilateral primary osteoarthritis, right hip: Secondary | ICD-10-CM | POA: Diagnosis not present

## 2018-11-08 DIAGNOSIS — M25551 Pain in right hip: Secondary | ICD-10-CM | POA: Diagnosis not present

## 2018-11-11 ENCOUNTER — Other Ambulatory Visit: Payer: Self-pay | Admitting: Internal Medicine

## 2018-11-11 DIAGNOSIS — B372 Candidiasis of skin and nail: Secondary | ICD-10-CM

## 2018-11-13 ENCOUNTER — Telehealth: Payer: Self-pay

## 2018-11-13 DIAGNOSIS — Z5181 Encounter for therapeutic drug level monitoring: Secondary | ICD-10-CM | POA: Diagnosis not present

## 2018-11-13 DIAGNOSIS — M1711 Unilateral primary osteoarthritis, right knee: Secondary | ICD-10-CM | POA: Diagnosis not present

## 2018-11-13 DIAGNOSIS — Z6838 Body mass index (BMI) 38.0-38.9, adult: Secondary | ICD-10-CM | POA: Diagnosis not present

## 2018-11-13 DIAGNOSIS — G600 Hereditary motor and sensory neuropathy: Secondary | ICD-10-CM | POA: Diagnosis not present

## 2018-11-13 DIAGNOSIS — Z87891 Personal history of nicotine dependence: Secondary | ICD-10-CM | POA: Diagnosis not present

## 2018-11-13 DIAGNOSIS — R296 Repeated falls: Secondary | ICD-10-CM | POA: Diagnosis not present

## 2018-11-13 DIAGNOSIS — Z87440 Personal history of urinary (tract) infections: Secondary | ICD-10-CM | POA: Diagnosis not present

## 2018-11-13 DIAGNOSIS — Z7982 Long term (current) use of aspirin: Secondary | ICD-10-CM | POA: Diagnosis not present

## 2018-11-13 DIAGNOSIS — Z79891 Long term (current) use of opiate analgesic: Secondary | ICD-10-CM | POA: Diagnosis not present

## 2018-11-13 MED ORDER — AZITHROMYCIN 250 MG PO TABS: ORAL_TABLET | ORAL | 0 refills | Status: DC

## 2018-11-13 MED ORDER — DOXYCYCLINE HYCLATE 100 MG PO TABS: 100.0000 mg | ORAL_TABLET | Freq: Two times a day (BID) | ORAL | 0 refills | Status: DC

## 2018-11-13 NOTE — Telephone Encounter (Signed)
Any increased cough and increased sputum production or increased dyspnea?  plz check portable CXR and start doxycycline antibiotic sent to pharmacy.  plz call us back tomorrow with update.

## 2018-11-13 NOTE — Telephone Encounter (Signed)
Marijean Heath nurse with [facility] left v/m;pt has bronchi,rales, and wheezing in bilateral lungs; Pt is currently doing OK & vitals are OK.Pt had fever 100.2 the other day. Becky Sax wants to know if Dr Darnell Level wants to send in abx, have CXR done at home or have virtual visit. Sonja request cb.

## 2018-11-14 NOTE — Telephone Encounter (Signed)
Spoke with Becky Sax, of [facility], relaying Dr. Synthia Innocent message.  She verbalizes understanding.  States pt denies any increased cough, increased sputum production or increased dyspnea.  Fyi to Dr. Darnell Level.

## 2018-11-15 ENCOUNTER — Telehealth: Payer: Self-pay | Admitting: *Deleted

## 2018-11-15 ENCOUNTER — Encounter: Payer: Self-pay | Admitting: Family Medicine

## 2018-11-15 DIAGNOSIS — Z7982 Long term (current) use of aspirin: Secondary | ICD-10-CM | POA: Diagnosis not present

## 2018-11-15 DIAGNOSIS — Z6838 Body mass index (BMI) 38.0-38.9, adult: Secondary | ICD-10-CM | POA: Diagnosis not present

## 2018-11-15 DIAGNOSIS — G600 Hereditary motor and sensory neuropathy: Secondary | ICD-10-CM | POA: Diagnosis not present

## 2018-11-15 DIAGNOSIS — M1711 Unilateral primary osteoarthritis, right knee: Secondary | ICD-10-CM | POA: Diagnosis not present

## 2018-11-15 DIAGNOSIS — R6889 Other general symptoms and signs: Secondary | ICD-10-CM | POA: Diagnosis not present

## 2018-11-15 DIAGNOSIS — Z79891 Long term (current) use of opiate analgesic: Secondary | ICD-10-CM | POA: Diagnosis not present

## 2018-11-15 DIAGNOSIS — R296 Repeated falls: Secondary | ICD-10-CM | POA: Diagnosis not present

## 2018-11-15 DIAGNOSIS — Z87440 Personal history of urinary (tract) infections: Secondary | ICD-10-CM | POA: Diagnosis not present

## 2018-11-15 DIAGNOSIS — Z5181 Encounter for therapeutic drug level monitoring: Secondary | ICD-10-CM | POA: Diagnosis not present

## 2018-11-15 DIAGNOSIS — Z87891 Personal history of nicotine dependence: Secondary | ICD-10-CM | POA: Diagnosis not present

## 2018-11-15 NOTE — Telephone Encounter (Signed)
Left message on vm for Sonja, of [facility], relaying Dr. Synthia Innocent message.

## 2018-11-15 NOTE — Telephone Encounter (Signed)
Sonja nurse with [facility] left a voicemail stating that a chest x-ray has been order and he is on the schedule to have it done today. Becky Sax stated that his wife is on the way to pick his antibiotic up now. Becky Sax stated that he continues to have bronchi and wheezing, but is up and talking. Becky Sax stated that he is not complaining of pain or discomfort, Becky Sax stated that she would like to go back out to see him Monday or Tuesday if the doctor is okay with that.  Sonja requested a call back.

## 2018-11-15 NOTE — Telephone Encounter (Signed)
Ok to do. Will await CXR results.

## 2018-11-19 ENCOUNTER — Telehealth: Payer: Self-pay | Admitting: *Deleted

## 2018-11-19 DIAGNOSIS — G600 Hereditary motor and sensory neuropathy: Secondary | ICD-10-CM | POA: Diagnosis not present

## 2018-11-19 DIAGNOSIS — Z6838 Body mass index (BMI) 38.0-38.9, adult: Secondary | ICD-10-CM | POA: Diagnosis not present

## 2018-11-19 DIAGNOSIS — Z5181 Encounter for therapeutic drug level monitoring: Secondary | ICD-10-CM | POA: Diagnosis not present

## 2018-11-19 DIAGNOSIS — M1711 Unilateral primary osteoarthritis, right knee: Secondary | ICD-10-CM | POA: Diagnosis not present

## 2018-11-19 DIAGNOSIS — Z79891 Long term (current) use of opiate analgesic: Secondary | ICD-10-CM | POA: Diagnosis not present

## 2018-11-19 DIAGNOSIS — R296 Repeated falls: Secondary | ICD-10-CM | POA: Diagnosis not present

## 2018-11-19 DIAGNOSIS — Z7982 Long term (current) use of aspirin: Secondary | ICD-10-CM | POA: Diagnosis not present

## 2018-11-19 DIAGNOSIS — Z87891 Personal history of nicotine dependence: Secondary | ICD-10-CM | POA: Diagnosis not present

## 2018-11-19 DIAGNOSIS — Z87440 Personal history of urinary (tract) infections: Secondary | ICD-10-CM | POA: Diagnosis not present

## 2018-11-19 NOTE — Telephone Encounter (Signed)
Noted thanks °

## 2018-11-19 NOTE — Telephone Encounter (Signed)
Sonja nurse with [facility] left a voicemail stating that she wanted to give an update on the patient.  Becky Sax stated that patient's lungs are cleared up and sound clear. Becky Sax stated that patient is feeling fine.

## 2018-11-21 ENCOUNTER — Other Ambulatory Visit: Payer: Self-pay

## 2018-11-21 ENCOUNTER — Ambulatory Visit (INDEPENDENT_AMBULATORY_CARE_PROVIDER_SITE_OTHER): Payer: Medicare PPO

## 2018-11-21 DIAGNOSIS — Z0000 Encounter for general adult medical examination without abnormal findings: Secondary | ICD-10-CM | POA: Diagnosis not present

## 2018-11-21 NOTE — Progress Notes (Signed)
PCP notes:   Health maintenance:  Colonoscopy - to be determined  Abnormal screenings:   Fall risk - hx of multiple falls Fall Risk  11/21/2018 03/14/2018 11/15/2017 05/08/2017 11/03/2016  Falls in the past year? 1 Yes Yes Yes Yes  Comment 8-10 falls due to loss of balance - - - -  Number falls in past yr: 1 1 2  or more 2 or more 2 or more  Comment - - - - -  Injury with Fall? 1 No Yes No No  Comment - - - - -  Risk Factor Category  - - - - High Fall Risk  Risk for fall due to : History of fall(s);Impaired balance/gait;Impaired mobility - - - History of fall(s);Impaired mobility;Other (Comment)  Risk for fall due to: Comment - - - - Charcot-marie-tooth disease with muscle atrophy  Follow up - - - - Education provided   Mini-Cog score: 16/17 MMSE - Mini Mental State Exam 11/21/2018 08/31/2016 07/19/2015  Orientation to time 5 5 5   Orientation to Place 5 5 5   Registration 3 3 3   Attention/ Calculation 0 0 5  Recall 2 3 3   Recall-comments unable to recall 1 of 3 words - -  Language- name 2 objects 0 0 -  Language- repeat 1 1 1   Language- follow 3 step command 0 3 -  Language- read & follow direction 0 0 1  Write a sentence 0 0 -  Copy design 0 0 -  Total score 16 20 -   Depression score: 13 Depression screen [facility] 2/9 11/21/2018 03/14/2018 11/15/2017 05/08/2017 11/03/2016  Decreased Interest 3 0 3 0 1  Down, Depressed, Hopeless 1 0 3 0 1  PHQ - 2 Score 4 0 6 0 2  Altered sleeping 3 - 3 - -  Tired, decreased energy 3 - 3 - -  Change in appetite 3 - 3 - -  Feeling bad or failure about yourself  0 - 3 - -  Trouble concentrating 0 - 3 - -  Moving slowly or fidgety/restless 0 - 2 - -  Suicidal thoughts 0 - 0 - -  PHQ-9 Score 13 - 23 - -  Difficult doing work/chores Not difficult at all - - - -  Some recent data might be hidden   Patient concerns:   None  Nurse concerns:  None  Next PCP appt:   11/29/18 @ 1030

## 2018-11-21 NOTE — Patient Instructions (Signed)
Mr. Mcguirt , Thank you for taking time to come for your Medicare Wellness Visit. I appreciate your ongoing commitment to your health goals. Please review the following plan we discussed and let me know if I can assist you in the future.   These are the goals we discussed: Goals    . Patient Stated     Starting 11/21/2018, I will continue to take medications as prescribed.        This is a list of the screening recommended for you and due dates:  Health Maintenance  Topic Date Due  . Colon Cancer Screening  06/04/2020*  . DTaP/Tdap/Td vaccine (1 - Tdap) 06/04/2025*  . Tetanus Vaccine  06/04/2025*  . Flu Shot  01/04/2019  .  Hepatitis C: One time screening is recommended by Center for Disease Control  (CDC) for  adults born from [birth year] through 1966.   Completed  . HIV Screening  Completed  *Topic was postponed. The date shown is not the original due date.   Preventive Care for Adults  A healthy lifestyle and preventive care can promote health and wellness. Preventive health guidelines for adults include the following key practices.  . A routine yearly physical is a good way to check with your health care provider about your health and preventive screening. It is a chance to share any concerns and updates on your health and to receive a thorough exam.  . Visit your dentist for a routine exam and preventive care every 6 months. Brush your teeth twice a day and floss once a day. Good oral hygiene prevents tooth decay and gum disease.  . The frequency of eye exams is based on your age, health, family medical history, use  of contact lenses, and other factors. Follow your health care provider's recommendations for frequency of eye exams.  . Eat a healthy diet. Foods like vegetables, fruits, whole grains, low-fat dairy products, and lean protein foods contain the nutrients you need without too many calories. Decrease your intake of foods high in solid fats, added sugars, and salt. Eat the  right amount of calories for you. Get information about a proper diet from your health care provider, if necessary.  . Regular physical exercise is one of the most important things you can do for your health. Most adults should get at least 150 minutes of moderate-intensity exercise (any activity that increases your heart rate and causes you to sweat) each week. In addition, most adults need muscle-strengthening exercises on 2 or more days a week.  Silver Sneakers may be a benefit available to you. To determine eligibility, you may visit the website: www.silversneakers.com or contact program at (332)424-5050 Mon-Fri between 8AM-8PM.   . Maintain a healthy weight. The body mass index (BMI) is a screening tool to identify possible weight problems. It provides an estimate of body fat based on height and weight. Your health care provider can find your BMI and can help you achieve or maintain a healthy weight.   For adults 20 years and older: ? A BMI below 18.5 is considered underweight. ? A BMI of 18.5 to 24.9 is normal. ? A BMI of 25 to 29.9 is considered overweight. ? A BMI of 30 and above is considered obese.   . Maintain normal blood lipids and cholesterol levels by exercising and minimizing your intake of saturated fat. Eat a balanced diet with plenty of fruit and vegetables. Blood tests for lipids and cholesterol should begin at age 24 and be repeated every  5 years. If your lipid or cholesterol levels are high, you are over 50, or you are at high risk for heart disease, you may need your cholesterol levels checked more frequently. Ongoing high lipid and cholesterol levels should be treated with medicines if diet and exercise are not working.  . If you smoke, find out from your health care provider how to quit. If you do not use tobacco, please do not start.  . If you choose to drink alcohol, please do not consume more than 2 drinks per day. One drink is considered to be 12 ounces (355 mL) of  beer, 5 ounces (148 mL) of wine, or 1.5 ounces (44 mL) of liquor.  . If you are 25-75 years old, ask your health care provider if you should take aspirin to prevent strokes.  . Use sunscreen. Apply sunscreen liberally and repeatedly throughout the day. You should seek shade when your shadow is shorter than you. Protect yourself by wearing long sleeves, pants, a wide-brimmed hat, and sunglasses year round, whenever you are outdoors.  . Once a month, do a whole body skin exam, using a mirror to look at the skin on your back. Tell your health care provider of new moles, moles that have irregular borders, moles that are larger than a pencil eraser, or moles that have changed in shape or color.

## 2018-11-21 NOTE — Progress Notes (Signed)
Subjective:   [name] is a 63 y.o. male who presents for Medicare Annual/Subsequent preventive examination.  Review of Systems:  N/A Cardiac Risk Factors include: advanced age (>20mn, >>87women);diabetes mellitus;dyslipidemia;hypertension;male gender;smoking/ tobacco exposure;obesity (BMI >30kg/m2)     Objective:    Vitals: There were no vitals taken for this visit.  There is no height or weight on file to calculate BMI.  Advanced Directives 11/21/2018 02/20/2018 11/03/2016 08/31/2016 08/27/2016 06/12/2016 01/03/2016  Does Patient Have a Medical Advance Directive? No Yes No No No No No  Type of Advance Directive - - - - - - -  Does patient want to make changes to medical advance directive? - - - - - - -  Copy of [place]? - - - - - - -  Would patient like information on creating a medical advance directive? No - Patient declined - Yes (MAU/Ambulatory/Procedural Areas - Information given) - No - Patient declined - No - patient declined information    Tobacco Social History   Tobacco Use  Smoking Status Current Some Day Smoker  . Packs/day: 1.00  . Years: 57.00  . Pack years: 57.00  . Types: Cigarettes, E-cigarettes  . Start date: 06/06/1967  . Last attempt to quit: 02/20/2018  . Years since quitting: 0.7  Smokeless Tobacco Never Used  Tobacco Comment   stopped smoking a pipe in 2015  DOES SMOKE E CIG     Ready to quit: No Counseling given: No Comment: stopped smoking a pipe in 2015  DOES SMOKE E CIG   Clinical Intake:  Pre-visit preparation completed: Yes        Nutritional Status: BMI > 30  Obese Nutritional Risks: None  How often do you need to have someone help you when you read instructions, pamphlets, or other written materials from your doctor or pharmacy?: 1 - Never What is the last grade level you completed in school?: Associate degree     Comments: pt lives with spouse Information entered by :: LPinson, RN  Past  Medical History:  Diagnosis Date  . AAA (abdominal aortic aneurysm) [place] 09[place]--  monitored by dr bTrula Slade  stable 5.6cm CTA abdomen 2016  . Abnormal drug screen 07/09/2016   1/2/018 - positive oxycodone, fentanyl, inapprop positive MJ - mod risk  . Allergic rhinitis   . B12 deficiency   . CAD (coronary artery disease) cardiologist-  dr cStanford Breed  x3 with stents last 2005, EF 40%, predominantly RCA by CT 2016  . Cataracts, bilateral   . Cervical spondylosis 05/2010   s/p surgery  . Charcot Marie Tooth muscular atrophy dx  [phone]  neurologist--  dr love--  type 2 per pt  . Chronic pain syndrome    established with [facility] ([place]) --> disagreement and [facility] write Rx but f/u with pain clinic Q6-12 months  . COPD (chronic obstructive pulmonary disease) [place] 10/2011   minimal by PFTs  . DDD (degenerative disc disease)   . Disturbances of sensation of smell and taste    improving  . Dyspnea on exertion   . GERD (gastroesophageal reflux disease)   . Gout   . Headache   . Hepatitis    hepatitis B  . Hidradenitis    right groin  . Hidradenitis suppurativa dx 2011   goin and leg crease   followd by LLyndle Herrlich- daily bactrim, s/p intralesional steroid  injection 10/2010  . Hip osteoarthritis    s/p intraarticular steroid shot (12/2012) ([place])  . History of hepatitis B 1983  . History of MI (myocardial infarction)    2000  &  2005  . History of pneumonia   . History of viral meningitis 2000  . HLD (hyperlipidemia)   . HTN (hypertension)   . Ischemic cardiomyopathy    s/p inferior MI  --  current ef per myoview 39%  . Liver cirrhosis secondary to NASH [place]) 01/2014   by CT scan, rec virtual colonoscopy by Dr Collene Mares 06/2014  . Lumbar herniated disc   . Myocardial infarction ([place])    x2  . Nocturia more than twice per night   . Obesity   . Spinal stenosis    released from [facility].  established with  preferred pain (07/2013)  . T2DM (type 2 diabetes mellitus) ([place])    ABIs WNL 2016  . Vitamin D deficiency    Past Surgical History:  Procedure Laterality Date  . ABDOMINAL AORTIC ENDOVASCULAR FENESTRATED STENT GRAFT N/A 11/30/2015   Procedure: ABDOMINAL AORTIC ENDOVASCULAR FENESTRATED STENT GRAFT;  Surgeon: Serafina Mitchell, MD;  Location: [facility];  Service: Vascular;  Laterality: N/A;  . ANTERIOR CERVICAL DECOMP/DISCECTOMY FUSION  01-07-2010    C4 -- C7  . CARDIAC CATHETERIZATION  03-30-2005  dr Albertine Patricia   ef 40% w/ inferior akinesis/  LM and CFX angiographically normal/  pLAD 30%/   Widely patent stents in RCA and PDA widely patent  . CARDIOVASCULAR STRESS TEST  10-23-2012  dr Stanford Breed   No ischemia/  Moderate scar in the inferior wall, otherwise normal perfusion/  LV ef 39%,  LV wall motion: inferior/ inferolateral hypokinesis  . COLONOSCOPY  05/06/2007   normal, small int hemorrhoids rpt 5 yrs due to fmhx - rec against rpt colonoscopy by Dr Collene Mares  . CORONARY ANGIOPLASTY  2000  dr Stanford Breed   PCI to RCA and PDA  . CORONARY ANGIOPLASTY WITH STENT PLACEMENT  03-19-2005  dr Gwyndolyn Saxon downey   inferior STEMI--- DES x4 to RCA w/ balloon angioplasty and balloon angioplasty to jailed PDA ostium/  severe hypokinesis of midinferor wall, ef 50%/  dLM 20%,  mLAD 20%,  dCFX 60%  . ESOPHAGOGASTRODUODENOSCOPY  01/2017   dilated benign esophageal stenosis, portal hypertensive gastropathy Henrene Pastor)  . ESOPHAGOGASTRODUODENOSCOPY (EGD) WITH PROPOFOL N/A 02/20/2018   benign biopsy [name], MD)  . HYDRADENITIS EXCISION Right 12/31/2014   Procedure: WIDE EXCISION HIDRADENITIS GROIN; Coralie Keens, MD  . LUMBAR DISC SURGERY     L5-S1  . LUMBAR LAMINECTOMY  05-18-2010   L2--5   laminectomy/foraminotomy for stenosis Joya Salm)  . MYELOGRAM     L5-S1 and L1-2 spondylosis  . SACROILIAC JOINT INJECTION Bilateral 10/2013   [place]  . TONSILLECTOMY AND ADENOIDECTOMY  1972   Family History  Problem Relation Age of  Onset  . Cancer Mother        colon  . Diabetes Mother   . Kidney disease Mother   . Aneurysm Mother        AAA  . Rheum arthritis Mother   . Charcot-Marie-Tooth disease Mother   . Heart disease Mother        before age 15  . Cancer Father        skin  . Heart attack Father   . Heart disease Father        before age 80  . Cancer Brother        skin  .  Coronary artery disease Brother   . Cancer Brother        small cell lung cancer  . Aneurysm Brother        AAA  . Rheum arthritis Sister   . Rheum arthritis Brother   . Prostate cancer Neg Hx   . Bladder Cancer Neg Hx   . Kidney cancer Neg Hx    Social History   Socioeconomic History  . Marital status: Married    Spouse name: [name]  . Number of children: 0  . Years of education: College  . Highest education level: Not on file  Occupational History  . Occupation: Neurosurgeon implants, crown and bridge-now disability 2006    Employer: UNEMPLOYED  Social Needs  . Financial resource strain: Not on file  . Food insecurity    Worry: Patient refused    Inability: Patient refused  . Transportation needs    Medical: Not on file    Non-medical: Not on file  Tobacco Use  . Smoking status: Current Some Day Smoker    Packs/day: 1.00    Years: 57.00    Pack years: 57.00    Types: Cigarettes, E-cigarettes    Start date: 06/06/1967    Last attempt to quit: 02/20/2018    Years since quitting: 0.7  . Smokeless tobacco: Never Used  . Tobacco comment: stopped smoking a pipe in 2015  DOES SMOKE E CIG  Substance and Sexual Activity  . Alcohol use: No    Alcohol/week: 0.0 standard drinks  . Drug use: Yes    Types: Fentanyl, Hydrocodone  . Sexual activity: Not on file  Lifestyle  . Physical activity    Days per week: Patient refused    Minutes per session: Patient refused  . Stress: Not on file  Relationships  . Social Herbalist on phone: Patient refused    Gets together: Patient refused    Attends  religious service: Patient refused    Active member of club or organization: Patient refused    Attends meetings of clubs or organizations: Patient refused    Relationship status: Patient refused  Other Topics Concern  . Not on file  Social History Narrative   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 x 5 years   caffeine: 2 cups coffee, 2 cups soda   Occupation: Neurosurgeon implants, crowne and bridge, now disability 2006   Lives with wife, 1 dog, no children    Outpatient Encounter Medications as of 11/21/2018  Medication Sig  . albuterol (PROVENTIL HFA;VENTOLIN HFA) 108 (90 Base) MCG/ACT inhaler Inhale 2 puffs into the lungs every 6 (six) hours as needed for wheezing or shortness of breath.  [name] albuterol (PROVENTIL) (2.5 MG/3ML) 0.083% nebulizer solution USE 1 VIAL PER NEBULIZER EVERY 6 HRS AS NEEDED FOR WHEEZING  . aspirin 81 MG tablet Take 1 tablet (81 mg total) by mouth daily.  . B Complex-C (SUPER B COMPLEX PO) Take 1 tablet by mouth daily.  . Ca Phosphate-Cholecalciferol [phone] MG-UNIT TABS Take by mouth.  . cholecalciferol 2000 units TABS Take 2,000 Units by mouth daily.  . Cobalamin Combinations (B-12) 1000-400 MCG SUBL Take by mouth.  . dicyclomine (BENTYL) 20 MG tablet TAKE 1 TABLET BY MOUTH 3 TIMES DAILY BEFORE MEALS. AS NEEDED FOR ABDOMINAL PAIN  . doxycycline (VIBRA-TABS) 100 MG tablet Take 1 tablet (100 mg total) by mouth 2 (two) times daily.  . DULoxetine (CYMBALTA) 60 MG capsule TAKE 1 CAPSULE BY MOUTH EVERY DAY  . fentaNYL (DURAGESIC -  DOSED MCG/HR) 50 MCG/HR Place 1 patch (50 mcg total) every other day onto the skin.  . fluticasone (FLONASE) 50 MCG/ACT nasal spray Place 2 sprays into both nostrils daily as needed for allergies.   . folic acid (FOLVITE) 1 MG tablet TAKE 1 TABLET BY MOUTH DAILY  . irbesartan (AVAPRO) 75 MG tablet Take 0.5 tablets (37.5 mg total) by mouth daily.  [name] lactulose (CEPHULAC) 20 g packet Take 1 packet (20 g total) by mouth 3 (three) times  daily.  [name] lactulose (CHRONULAC) 10 GM/15ML solution   . loperamide (IMODIUM) 2 MG capsule Take 1 capsule (2 mg total) by mouth 4 (four) times daily as needed for diarrhea or loose stools.  . metoprolol succinate (TOPROL-XL) 25 MG 24 hr tablet TAKE 1/2 TABLET BY MOUTH ONCE DAILY.  [name] Misc Natural Products (TART CHERRY ADVANCED) CAPS Take 1 capsule by mouth 2 (two) times daily.  . nitroGLYCERIN (NITROSTAT) 0.4 MG SL tablet Place 1 tablet (0.4 mg total) under the tongue every 5 (five) minutes as needed for chest pain.  [name] nystatin (MYCOSTATIN/NYSTOP) powder Apply topically 2 (two) times daily.  [name] omeprazole (PRILOSEC) 40 MG capsule Take 1 capsule (40 mg total) by mouth daily.  . ondansetron (ZOFRAN-ODT) 4 MG disintegrating tablet Take 1 tablet (4 mg total) by mouth every 8 (eight) hours as needed for nausea or vomiting. Day supply per insurance.  [name] oxyCODONE (ROXICODONE) 15 MG immediate release tablet Take 1 tablet (15 mg total) every 4 (four) hours as needed by mouth.  . polyethylene glycol powder (GLYCOLAX/MIRALAX) powder Take 17 g by mouth daily as needed for moderate constipation.  . Red Yeast Rice 600 MG CAPS Take 2 capsules by mouth daily.  . vitamin B-12 (CYANOCOBALAMIN) 500 MCG tablet Take by mouth.   No facility-administered encounter medications on file as of 11/21/2018.     Activities of Daily Living In your present state of health, do you have any difficulty performing the following activities: 11/21/2018 03/19/2018  Hearing? N N  Vision? Y N  Difficulty concentrating or making decisions? Y N  Walking or climbing stairs? Y -  Dressing or bathing? Y Y  Doing errands, shopping? Y -  Conservation officer, nature and eating ? Y -  Using the Toilet? N -  In the past six months, have you accidently leaked urine? Y -  Do you have problems with loss of bowel control? Y -  Managing your Medications? Y -  Managing your Finances? Y -  Housekeeping or managing your Housekeeping? Y -  Some recent data  might be hidden    Patient Care Team: Ria Bush, MD as PCP - General (Family Medicine) Love, Alyson Locket, MD as Consulting Physician (Neurology) Stanford Breed, Denice Bors, MD as Consulting Physician (Cardiology) Druscilla Brownie, MD as Consulting Physician (Dermatology) Glennie Isle, PA-C as Consulting Physician (Physician Assistant) Elam Dutch, MD as Consulting Physician (Vascular Surgery) Leeroy Cha, MD as Attending Physician (Neurosurgery) Leone Payor, MD as Referring Physician (Pain Medicine)   Assessment:   This is a routine wellness examination for Cylis.  Exercise Activities and Dietary recommendations Current Exercise Habits: The patient does not participate in regular exercise at present, Exercise limited by: orthopedic condition(s)  Goals    . Patient Stated     Starting 11/21/2018, I will continue to take medications as prescribed.        Fall Risk Fall Risk  11/21/2018 03/14/2018 11/15/2017 05/08/2017 11/03/2016  Falls in the past year? 1 Yes Yes Yes Yes  Comment 8-10 falls due to loss of balance - - - -  Number falls in past yr: 1 1 2  or more 2 or more 2 or more  Comment - - - - -  Injury with Fall? 1 No Yes No No  Comment - - - - -  Risk Factor Category  - - - - High Fall Risk  Risk for fall due to : History of fall(s);Impaired balance/gait;Impaired mobility - - - History of fall(s);Impaired mobility;Other (Comment)  Risk for fall due to: Comment - - - - Charcot-marie-tooth disease with muscle atrophy  Follow up - - - - Education provided   Depression Screen PHQ 2/9 Scores 11/21/2018 03/14/2018 11/15/2017 05/08/2017  PHQ - 2 Score 4 0 6 0  PHQ- 9 Score 13 - 23 -    Cognitive Function MMSE - Mini Mental State Exam 11/21/2018 08/31/2016 07/19/2015  Orientation to time 5 5 5   Orientation to Place 5 5 5   Registration 3 3 3   Attention/ Calculation 0 0 5  Recall 2 3 3   Recall-comments unable to recall 1 of 3 words - -  Language- name 2 objects 0 0 -   Language- repeat 1 1 1   Language- follow 3 step command 0 3 -  Language- read & follow direction 0 0 1  Write a sentence 0 0 -  Copy design 0 0 -  Total score 16 20 -        Immunization History  Administered Date(s) Administered  . Hepatitis B 06/05/1988, 08/03/1988, 11/03/1988  . Hepatitis B, adult 03/27/2018  . Influenza Split 05/11/2011, 04/23/2012  . Influenza Whole 06/05/2005, 06/05/2008, 05/03/2010  . Influenza,inj,Quad PF,6+ Mos 03/13/2013, 04/21/2014, 04/05/2015, 04/26/2016  . Pneumococcal Polysaccharide-23 06/05/2005  . Td 06/05/2005   Screening Tests Health Maintenance  Topic Date Due  . COLONOSCOPY  06/04/2020 (Originally 05/05/2012)  . DTaP/Tdap/Td (1 - Tdap) 06/04/2025 (Originally 03/04/1974)  . TETANUS/TDAP  06/04/2025 (Originally 06/06/2015)  . INFLUENZA VACCINE  01/04/2019  . Hepatitis C Screening  Completed  . HIV Screening  Completed      Plan:     I have personally reviewed, addressed, and noted the following in the patient's chart:  A. Medical and social history B. Use of alcohol, tobacco or illicit drugs  C. Current medications and supplements D. Functional ability and status E.  Nutritional status F.  Physical activity G. Advance directives H. List of other physicians I.  Hospitalizations, surgeries, and ER visits in previous 12 months J.  Vitals (unless it is a telemedicine encounter) K. Screenings to include cognitive, depression, hearing, vision (NOTE: hearing and vision screenings not completed in telemedicine encounter) L. Referrals and appointments   In addition, I have reviewed and discussed with patient certain preventive protocols, quality metrics, and best practice recommendations. A written personalized care plan for preventive services and recommendations were provided to patient.  With patient's permission, we connected on 11/21/18 at 12:00 PM EDT. Interactive audio and video telecommunications were attempted with patient. This attempt  was unsuccessful due to patient having technical difficulties OR patient did not have access to video capability.  Encounter was completed with audio only.  Two patient identifiers were used to ensure the encounter occurred with the correct person. Patient was in home and writer was in office.   Signed,   Lindell Noe, MHA, BS, RN Health Coach

## 2018-11-26 NOTE — Telephone Encounter (Signed)
Mobile CXR read as pulmonary venous congestion without acute pulm disease 11/15/2018. Asked to scan

## 2018-11-27 DIAGNOSIS — Z87891 Personal history of nicotine dependence: Secondary | ICD-10-CM | POA: Diagnosis not present

## 2018-11-27 DIAGNOSIS — Z79891 Long term (current) use of opiate analgesic: Secondary | ICD-10-CM | POA: Diagnosis not present

## 2018-11-27 DIAGNOSIS — Z6838 Body mass index (BMI) 38.0-38.9, adult: Secondary | ICD-10-CM | POA: Diagnosis not present

## 2018-11-27 DIAGNOSIS — Z7982 Long term (current) use of aspirin: Secondary | ICD-10-CM | POA: Diagnosis not present

## 2018-11-27 DIAGNOSIS — Z87440 Personal history of urinary (tract) infections: Secondary | ICD-10-CM | POA: Diagnosis not present

## 2018-11-27 DIAGNOSIS — Z5181 Encounter for therapeutic drug level monitoring: Secondary | ICD-10-CM | POA: Diagnosis not present

## 2018-11-27 DIAGNOSIS — M1711 Unilateral primary osteoarthritis, right knee: Secondary | ICD-10-CM | POA: Diagnosis not present

## 2018-11-27 DIAGNOSIS — G600 Hereditary motor and sensory neuropathy: Secondary | ICD-10-CM | POA: Diagnosis not present

## 2018-11-27 DIAGNOSIS — R296 Repeated falls: Secondary | ICD-10-CM | POA: Diagnosis not present

## 2018-11-29 ENCOUNTER — Ambulatory Visit: Payer: Medicare PPO | Admitting: Family Medicine

## 2018-11-29 ENCOUNTER — Other Ambulatory Visit: Payer: Self-pay | Admitting: Family Medicine

## 2018-11-29 NOTE — Telephone Encounter (Signed)
Doxycycline Last filled:  11/13/18, #20 Last OV:  09/18/18, f/u Next OV:  12/04/18, CPE Pt 2

## 2018-12-01 DIAGNOSIS — M1611 Unilateral primary osteoarthritis, right hip: Secondary | ICD-10-CM | POA: Diagnosis not present

## 2018-12-01 DIAGNOSIS — R069 Unspecified abnormalities of breathing: Secondary | ICD-10-CM | POA: Diagnosis not present

## 2018-12-01 DIAGNOSIS — R296 Repeated falls: Secondary | ICD-10-CM | POA: Diagnosis not present

## 2018-12-01 DIAGNOSIS — G600 Hereditary motor and sensory neuropathy: Secondary | ICD-10-CM | POA: Diagnosis not present

## 2018-12-02 NOTE — Progress Notes (Signed)
I reviewed health advisor's note, was available for consultation, and agree with documentation and plan.  

## 2018-12-03 ENCOUNTER — Telehealth: Payer: Self-pay | Admitting: *Deleted

## 2018-12-03 NOTE — Telephone Encounter (Addendum)
Called patient back and got his wife which is on [place]. Mrs. Mizuno was advised that appointment has been changed to virtual because of symptoms. Mrs. Conley stated that she is sure that her husband does not have Covid-19, but understood why appointment was changed. Patient's wife stated that he is an angry man and depressed. Mrs. Dickie stated that he is smoking at least a pack of cigarettes a day and sleeps all the time. Mrs. Schoenfelder stated that their dog that they have had for 16 years just passed away and he is more depressed since that has happened. Patient's wife stated that his memory has gotten bad and has gotten real forgetful. Advised patient's wife information will be sent back to Dr. Danise Mina so that he will be aware of what is going on when he does the virtual visit tomorrow with her husband tomorrow.

## 2018-12-03 NOTE — Telephone Encounter (Signed)
Patient called to confirm his appointment tomorrow with Dr. Danise Mina. Patient was sent to triage because of symptoms. Spoke to patient and was advised that he has recently had pneumonia. Patient stated that he has SOB, cough chills, headache, throwing up off and on for 3 weeks. Patient stated that his brother passed away 2 months ago with Covid-19, but he had not been around him. Patient stated that he is upset with Dr. Danise Mina because he was suppose to call him and he never got a phone call. Unable to find anything in Epic regarding the phone call that was suppose to have been made. Patient stated that he does not have a fever today, but has  had a fever one day.  Spoke to Dr. Danise Mina and decided that appointment tomorrow should be changed to a virtual and visit should be acute and not physical because of patient's complaints.

## 2018-12-04 ENCOUNTER — Telehealth: Payer: Self-pay | Admitting: Family Medicine

## 2018-12-04 ENCOUNTER — Ambulatory Visit (INDEPENDENT_AMBULATORY_CARE_PROVIDER_SITE_OTHER): Payer: Medicare PPO | Admitting: Family Medicine

## 2018-12-04 ENCOUNTER — Encounter: Payer: Self-pay | Admitting: Family Medicine

## 2018-12-04 VITALS — BP 130/82 | HR 99 | Temp 97.7°F | Ht 70.0 in | Wt 247.0 lb

## 2018-12-04 DIAGNOSIS — M1A9XX0 Chronic gout, unspecified, without tophus (tophi): Secondary | ICD-10-CM

## 2018-12-04 DIAGNOSIS — F332 Major depressive disorder, recurrent severe without psychotic features: Secondary | ICD-10-CM

## 2018-12-04 DIAGNOSIS — I714 Abdominal aortic aneurysm, without rupture, unspecified: Secondary | ICD-10-CM

## 2018-12-04 DIAGNOSIS — R5382 Chronic fatigue, unspecified: Secondary | ICD-10-CM

## 2018-12-04 DIAGNOSIS — E785 Hyperlipidemia, unspecified: Secondary | ICD-10-CM

## 2018-12-04 DIAGNOSIS — M1611 Unilateral primary osteoarthritis, right hip: Secondary | ICD-10-CM

## 2018-12-04 DIAGNOSIS — E559 Vitamin D deficiency, unspecified: Secondary | ICD-10-CM

## 2018-12-04 DIAGNOSIS — K7460 Unspecified cirrhosis of liver: Secondary | ICD-10-CM

## 2018-12-04 DIAGNOSIS — E538 Deficiency of other specified B group vitamins: Secondary | ICD-10-CM

## 2018-12-04 DIAGNOSIS — E440 Moderate protein-calorie malnutrition: Secondary | ICD-10-CM

## 2018-12-04 DIAGNOSIS — G600 Hereditary motor and sensory neuropathy: Secondary | ICD-10-CM

## 2018-12-04 DIAGNOSIS — R531 Weakness: Secondary | ICD-10-CM

## 2018-12-04 DIAGNOSIS — I2510 Atherosclerotic heart disease of native coronary artery without angina pectoris: Secondary | ICD-10-CM

## 2018-12-04 DIAGNOSIS — F17200 Nicotine dependence, unspecified, uncomplicated: Secondary | ICD-10-CM

## 2018-12-04 DIAGNOSIS — I700 Atherosclerosis of aorta: Secondary | ICD-10-CM

## 2018-12-04 DIAGNOSIS — J449 Chronic obstructive pulmonary disease, unspecified: Secondary | ICD-10-CM | POA: Diagnosis not present

## 2018-12-04 DIAGNOSIS — D696 Thrombocytopenia, unspecified: Secondary | ICD-10-CM

## 2018-12-04 DIAGNOSIS — G894 Chronic pain syndrome: Secondary | ICD-10-CM

## 2018-12-04 DIAGNOSIS — D649 Anemia, unspecified: Secondary | ICD-10-CM

## 2018-12-04 DIAGNOSIS — R296 Repeated falls: Secondary | ICD-10-CM

## 2018-12-04 MED ORDER — IRBESARTAN 75 MG PO TABS: 37.5000 mg | ORAL_TABLET | Freq: Every day | ORAL | 3 refills | Status: DC

## 2018-12-04 MED ORDER — METOPROLOL SUCCINATE ER 25 MG PO TB24: 12.5000 mg | ORAL_TABLET | Freq: Every day | ORAL | 3 refills | Status: DC

## 2018-12-04 MED ORDER — DULOXETINE HCL 60 MG PO CPEP: 60.0000 mg | ORAL_CAPSULE | Freq: Every day | ORAL | 3 refills | Status: DC

## 2018-12-04 NOTE — Progress Notes (Signed)
Virtual visit completed through [employer]. Due to national recommendations of social distancing due to COVID-19, a virtual visit is felt to be most appropriate for this patient at this time. Reviewed limitations of a virtual visit.   Patient location: home Provider location: [place] [facility], office If any vitals were documented, they were collected by patient at home unless specified below.    BP 130/82 (BP Location: Right Arm, Patient Position: Sitting)   Pulse 99   Temp 97.7 F (36.5 C)   Ht 5' 10"  (1.778 m)   Wt 247 lb (112 kg)   SpO2 95%   BMI 35.44 kg/m    CC: f/u visit Subjective:    Patient ID: [name], male    DOB: 28-Jul-1955, 63 y.o.   MRN: [id number]  HPI: [name] is a 63 y.o. male presenting on 12/04/2018 for Annual Exam (Pt 2. )   Did not see Katha Cabal this year. Complicated year this past year.  Just lost 29 yo dog. Lost aunt and brother (Covid). Grieving. Struggling with depression exacerbated by chronic pain and decreased quality of life - "laying in bed in pain all day" was unable to enjoy yesterday's 43rd wedding anniversary.   Vomited on Monday. Today feeling better.  Restarted smoking 1/2 ppd - wife thinks more. Would be willing to quit if he states it would mean he could have hip surgery.   HH continues coming out to the house - only SN. PT has stopped coming out to house due to poor progression - planning to restart next week.   Upcoming appt with [facility] for AAA [place] 01/06/2019.   Per recent hepatology evaluation at [facility] liver clinic [facility]): "Strictly speaking from a liver perspective, orthopedic surgery is not completely contraindicated as it will likely improve his mobility and quality of life, but he should have a thorough cardiac evaluation first. There is little that can be done pre-operatively to improve any liver related risk, he is low MELD. Due to poor QOL and limited mobility, risk/benefit may favor surgery"  Per recent  cardiology evaluation [facility]): Stress test 11/2018 - No ischemia; medical therapy; ok for surgery   Spoke separately with wife who is also at wit's end seeing patient suffer.      Relevant past medical, surgical, family and social history reviewed and updated as indicated. Interim medical history since our last visit reviewed. Allergies and medications reviewed and updated. Outpatient Medications Prior to Visit  Medication Sig Dispense Refill  . albuterol (PROVENTIL HFA;VENTOLIN HFA) 108 (90 Base) MCG/ACT inhaler Inhale 2 puffs into the lungs every 6 (six) hours as needed for wheezing or shortness of breath. 1 Inhaler 0  . albuterol (PROVENTIL) (2.5 MG/3ML) 0.083% nebulizer solution USE 1 VIAL PER NEBULIZER EVERY 6 HRS AS NEEDED FOR WHEEZING 75 mL 6  . aspirin 81 MG tablet Take 1 tablet (81 mg total) by mouth daily.    . B Complex-C (SUPER B COMPLEX PO) Take 1 tablet by mouth daily.    . Ca Phosphate-Cholecalciferol [phone] MG-UNIT TABS Take by mouth.    . cholecalciferol 2000 units TABS Take 2,000 Units by mouth daily.    . Cobalamin Combinations (B-12) 1000-400 MCG SUBL Take by mouth.    . dicyclomine (BENTYL) 20 MG tablet TAKE 1 TABLET BY MOUTH 3 TIMES DAILY BEFORE MEALS. AS NEEDED FOR ABDOMINAL PAIN 30 tablet 0  . fentaNYL (DURAGESIC - DOSED MCG/HR) 50 MCG/HR Place 1 patch (50 mcg total) every other day onto  the skin. 15 patch 0  . fluticasone (FLONASE) 50 MCG/ACT nasal spray Place 2 sprays into both nostrils daily as needed for allergies.     . folic acid (FOLVITE) 1 MG tablet TAKE 1 TABLET BY MOUTH DAILY 30 tablet 0  . lactulose (CEPHULAC) 20 g packet Take 1 packet (20 g total) by mouth 3 (three) times daily. 30 each 0  . loperamide (IMODIUM) 2 MG capsule Take 1 capsule (2 mg total) by mouth 4 (four) times daily as needed for diarrhea or loose stools. 12 capsule 0  . Misc Natural Products (TART CHERRY ADVANCED) CAPS Take 1 capsule by mouth 2 (two) times daily.    . nitroGLYCERIN  (NITROSTAT) 0.4 MG SL tablet Place 1 tablet (0.4 mg total) under the tongue every 5 (five) minutes as needed for chest pain. 25 tablet 12  . nystatin (MYCOSTATIN/NYSTOP) powder Apply topically 2 (two) times daily. 60 g 11  . omeprazole (PRILOSEC) 40 MG capsule Take 1 capsule (40 mg total) by mouth daily. 30 capsule 11  . ondansetron (ZOFRAN-ODT) 4 MG disintegrating tablet Take 1 tablet (4 mg total) by mouth every 8 (eight) hours as needed for nausea or vomiting. Day supply per insurance. 20 tablet 3  . oxyCODONE (ROXICODONE) 15 MG immediate release tablet Take 1 tablet (15 mg total) every 4 (four) hours as needed by mouth. 180 tablet 0  . polyethylene glycol powder (GLYCOLAX/MIRALAX) powder Take 17 g by mouth daily as needed for moderate constipation. 3350 g 0  . Red Yeast Rice 600 MG CAPS Take 2 capsules by mouth daily.    . vitamin B-12 (CYANOCOBALAMIN) 500 MCG tablet Take by mouth.    . doxycycline (VIBRA-TABS) 100 MG tablet Take 1 tablet (100 mg total) by mouth 2 (two) times daily. 20 tablet 0  . DULoxetine (CYMBALTA) 60 MG capsule TAKE 1 CAPSULE BY MOUTH EVERY DAY 30 capsule 4  . irbesartan (AVAPRO) 75 MG tablet Take 0.5 tablets (37.5 mg total) by mouth daily. 15 tablet 11  . lactulose (CHRONULAC) 10 GM/15ML solution     . metoprolol succinate (TOPROL-XL) 25 MG 24 hr tablet TAKE 1/2 TABLET BY MOUTH ONCE DAILY. 45 tablet 1   No facility-administered medications prior to visit.      Per HPI unless specifically indicated in ROS section below Review of Systems Objective:    BP 130/82 (BP Location: Right Arm, Patient Position: Sitting)   Pulse 99   Temp 97.7 F (36.5 C)   Ht 5' 10"  (1.778 m)   Wt 247 lb (112 kg)   SpO2 95%   BMI 35.44 kg/m   Wt Readings from Last 3 Encounters:  12/04/18 247 lb (112 kg)  11/05/18 248 lb (112.5 kg)  10/18/18 248 lb (112.5 kg)     Physical exam: Gen: alert, NAD, chronically ill appearing Pulm: speaks in complete sentences without increased work of  breathing Psych: normal mood, normal thought content      Lab Results  Component Value Date   CHOL 147 11/15/2017   HDL 36.50 (L) 11/15/2017   LDLCALC 88 11/15/2017   LDLDIRECT 128.0 05/06/2012   TRIG 113.0 11/15/2017   CHOLHDL 4 11/15/2017    Assessment & Plan:  Over 60 minutes were spent face-to-face with the patient during this encounter and >50% of that time was spent on counseling and coordination of care  Problem List Items Addressed This Visit    Vitamin D deficiency   Vitamin B12 deficiency   Thrombocytopenia ([place])  Smoker    Advised full cessation in preparation for possible hip surgery.      Severe obesity (BMI 35.0-39.9) with comorbidity ([place])    Reports weight loss to below 250lbs which is goal that ortho had recommended prior to hip surgery.      Recurrent falls   Protein-calorie malnutrition ([place])    Anticipate poor nutrition due to depression and pain - but weight remains steady. I will reassess with upcoming labs and encouraged renewed efforts at good nutritional intake.       Osteoarthritis of right hip    Chronic, end stage, pain worsening and limiting movements and quality of life. He repeatedly states he wants surgery even if it has life threatening complications as he does not want to continue living in his current state.  Advised call ortho today to schedule f/u appt and see if they will agree to proceed with surgery.       MDD (major depressive disorder), recurrent severe, without psychosis ([place]) - Primary    Worsening, severe, with SI. Spoke together and separately with patient and with wife. Reviewed need to call 911 or to go ER if persistent SI, extensively reviewed with wife option of involuntary commitment for safety if worsening SI. Anticipate large portion of current depression is situational due to hip pain and impaired quality of life, he agrees. Advised of importance to follow up with ortho to discuss surgery as I think that if ortho agrees and  surgery is scheduled this would also significantly help his state of mind.  He declines medication adjustment or psychology/psychiatry referral. Wife will continue to talk to him about this. Advised of importance to continue cymbalta 48m daily.  Wife is planning to have someone come stay with him on days she has to work (works nights).       Relevant Medications   DULoxetine (CYMBALTA) 60 MG capsule   HLD (hyperlipidemia)   Relevant Medications   irbesartan (AVAPRO) 75 MG tablet   metoprolol succinate (TOPROL-XL) 25 MG 24 hr tablet   Gout   General weakness   COPD (chronic obstructive pulmonary disease) (HCC)   Cirrhosis of liver without ascites ([place]    Cleared by [facility] to proceed with surgery.       Chronic pain syndrome    Chronic, severe ongoing pain largely stemming from hip arthritis. [place] Pr[place] on high dose oxycodone, states it is not effectively controlling pain.       Relevant Medications   DULoxetine (CYMBALTA) 60 MG capsule   Chronic fatigue    Update labs - [place] for lab[place].       Charcot-Marie-Tooth disease    Ongoing ?contributing to worsening disability. I have asked him to return for in-office evaluation in 2-3 wks.       Relevant Medications   DULoxetine (CYMBALTA) 60 MG capsule   CAD (coronary artery disease)    Has been cleared by cards. See above.       Relevant Medications   irbesartan (AVAPRO) 75 MG tablet   metoprolol succinate (TOPROL-XL) 25 MG 24 hr tablet   Aortic atherosclerosis (HCC)   Relevant Medications   irbesartan (AVAPRO) 75 MG tablet   metoprolol succinate (TOPROL-XL) 25 MG 24 hr tablet   Anemia   Abdominal aortic aneurysm (AAA) without rupture ([place]    S/p endovascular repair 11/2015. Upcoming [facility] appt next month.  Relevant Medications   irbesartan (AVAPRO) 75 MG tablet   metoprolol succinate (TOPROL-XL) 25 MG 24 hr tablet       Meds ordered this  encounter  Medications  . irbesartan (AVAPRO) 75 MG tablet    Sig: Take 0.5 tablets (37.5 mg total) by mouth daily.    Dispense:  45 tablet    Refill:  3  . metoprolol succinate (TOPROL-XL) 25 MG 24 hr tablet    Sig: Take 0.5 tablets (12.5 mg total) by mouth daily.    Dispense:  45 tablet    Refill:  3  . DULoxetine (CYMBALTA) 60 MG capsule    Sig: Take 1 capsule (60 mg total) by mouth daily.    Dispense:  90 capsule    Refill:  3   No orders of the defined types were placed in this encounter.   I discussed the assessment and treatment plan with the patient. The patient was provided an opportunity to ask questions and all were answered. The patient agreed with the plan and demonstrated an understanding of the instructions. The patient was advised to call back or seek an in-person evaluation if the symptoms worsen or if the condition fails to improve as anticipated.  Follow up plan: Return in about 3 weeks (around 12/25/2018) for follow up visit.  Ria Bush, MD

## 2018-12-04 NOTE — Assessment & Plan Note (Signed)
S/p endovascular repair 11/2015. Upcoming [facility] appt next month.

## 2018-12-04 NOTE — Telephone Encounter (Signed)
plz call [facility] nurse - I would like to set up home labwork for patient.  Please draw: CBC, CMP, LDH, TSH, free T4, A1c, uric acid, FLP, vitamin D (25-OH), vitamin B12, A1c, fructosamine

## 2018-12-04 NOTE — Telephone Encounter (Signed)
Will see today.  

## 2018-12-04 NOTE — Telephone Encounter (Signed)
Spoke with [place], at [phone], relaying Dr. Synthia Innocent message.  Verbalizes understanding.  Says she will see pt again on 12/11/18.

## 2018-12-06 NOTE — Assessment & Plan Note (Signed)
Has been cleared by cards. See above.

## 2018-12-06 NOTE — Assessment & Plan Note (Signed)
Reports weight loss to below 250lbs which is goal that ortho had recommended prior to hip surgery.

## 2018-12-06 NOTE — Telephone Encounter (Addendum)
Thanks. Please call Monday for an update on status of f/u ortho appt for hip surgery.  plz check on pt and how he's doing (depression/pain). Let him know we are planning on labs on Wednesday through [facility].

## 2018-12-06 NOTE — Assessment & Plan Note (Signed)
Worsening, severe, with SI. Spoke together and separately with patient and with wife. Reviewed need to call 911 or to go ER if persistent SI, extensively reviewed with wife option of involuntary commitment for safety if worsening SI. Anticipate large portion of current depression is situational due to hip pain and impaired quality of life, he agrees. Advised of importance to follow up with ortho to discuss surgery as I think that if ortho agrees and surgery is scheduled this would also significantly help his state of mind.  He declines medication adjustment or psychology/psychiatry referral. Wife will continue to talk to him about this. Advised of importance to continue cymbalta 37m daily.  Wife is planning to have someone come stay with him on days she has to work (works nights).

## 2018-12-06 NOTE — Assessment & Plan Note (Signed)
Ongoing ?contributing to worsening disability. I have asked him to return for in-office evaluation in 2-3 wks.

## 2018-12-06 NOTE — Assessment & Plan Note (Signed)
Anticipate poor nutrition due to depression and pain - but weight remains steady. I will reassess with upcoming labs and encouraged renewed efforts at good nutritional intake.

## 2018-12-06 NOTE — Assessment & Plan Note (Signed)
Update labs - I have asked [place] RN to come out to the house for labwork.

## 2018-12-06 NOTE — Assessment & Plan Note (Signed)
Chronic, severe ongoing pain largely stemming from hip arthritis. Followed by pain clinic in [place] (Dr Posey Pronto) on high dose oxycodone, states it is not effectively controlling pain.

## 2018-12-06 NOTE — Assessment & Plan Note (Signed)
Chronic, end stage, pain worsening and limiting movements and quality of life. He repeatedly states he wants surgery even if it has life threatening complications as he does not want to continue living in his current state.  Advised call ortho today to schedule f/u appt and see if they will agree to proceed with surgery.

## 2018-12-06 NOTE — Assessment & Plan Note (Signed)
Cleared by [facility] to proceed with surgery.

## 2018-12-06 NOTE — Assessment & Plan Note (Signed)
Advised full cessation in preparation for possible hip surgery.

## 2018-12-09 NOTE — Telephone Encounter (Signed)
Returned pt's wife's call.  Per [name], pt is not doing well.  Has ortho f/u on Thurs, 12/12/18.  Says she got my message about HH doing labs on Wed.  She says pt is not doing well at all.  Says he's really depressed, with the pain and recently losing their dog.  He's just having a hard time.  Does not want to get out of bed.

## 2018-12-09 NOTE — Telephone Encounter (Signed)
Noted. Will await HH eval.

## 2018-12-09 NOTE — Telephone Encounter (Signed)
Patient's Wife returned a call from the office   Best C/B # [phone]

## 2018-12-09 NOTE — Telephone Encounter (Signed)
Left message on vm per dpr asking pt and/or wife, [name] (on dpr), to call back letting Dr. Darnell Level know how pt is doing after ortho f/u since surgery.  Also, how he is doing with depression due to pain.

## 2018-12-10 DIAGNOSIS — Z5181 Encounter for therapeutic drug level monitoring: Secondary | ICD-10-CM | POA: Diagnosis not present

## 2018-12-10 DIAGNOSIS — G600 Hereditary motor and sensory neuropathy: Secondary | ICD-10-CM | POA: Diagnosis not present

## 2018-12-10 DIAGNOSIS — Z6838 Body mass index (BMI) 38.0-38.9, adult: Secondary | ICD-10-CM | POA: Diagnosis not present

## 2018-12-10 DIAGNOSIS — R296 Repeated falls: Secondary | ICD-10-CM | POA: Diagnosis not present

## 2018-12-10 DIAGNOSIS — Z87891 Personal history of nicotine dependence: Secondary | ICD-10-CM | POA: Diagnosis not present

## 2018-12-10 DIAGNOSIS — K7460 Unspecified cirrhosis of liver: Secondary | ICD-10-CM | POA: Diagnosis not present

## 2018-12-10 DIAGNOSIS — M1711 Unilateral primary osteoarthritis, right knee: Secondary | ICD-10-CM | POA: Diagnosis not present

## 2018-12-10 DIAGNOSIS — Z87440 Personal history of urinary (tract) infections: Secondary | ICD-10-CM | POA: Diagnosis not present

## 2018-12-10 DIAGNOSIS — Z79891 Long term (current) use of opiate analgesic: Secondary | ICD-10-CM | POA: Diagnosis not present

## 2018-12-10 DIAGNOSIS — Z7982 Long term (current) use of aspirin: Secondary | ICD-10-CM | POA: Diagnosis not present

## 2018-12-10 LAB — HEPATIC FUNCTION PANEL
ALT: 10 (ref 10–40)
AST: 19 (ref 14–40)
Alkaline Phosphatase: 152 — AB (ref 25–125)
Bilirubin, Total: 0.9

## 2018-12-10 LAB — HEMOGLOBIN A1C: Hemoglobin A1C: 4.8

## 2018-12-10 LAB — CBC AND DIFFERENTIAL
Hemoglobin: 10.8 — AB (ref 13.5–17.5)
Platelets: 48 — AB (ref 150–399)
WBC: 3.3

## 2018-12-10 LAB — BASIC METABOLIC PANEL
Creatinine: 0.6 (ref 0.6–1.3)
Glucose: 113
Potassium: 3.8 (ref 3.4–5.3)
Sodium: 137 (ref 137–147)

## 2018-12-10 LAB — VITAMIN D 25 HYDROXY (VIT D DEFICIENCY, FRACTURES): Vit D, 25-Hydroxy: 20.7

## 2018-12-10 LAB — VITAMIN B12: Vitamin B-12: 1859

## 2018-12-10 LAB — TSH: TSH: 1.41 (ref 0.41–5.90)

## 2018-12-12 ENCOUNTER — Other Ambulatory Visit: Payer: Self-pay | Admitting: Family Medicine

## 2018-12-12 DIAGNOSIS — M1611 Unilateral primary osteoarthritis, right hip: Secondary | ICD-10-CM | POA: Diagnosis not present

## 2018-12-12 DIAGNOSIS — L732 Hidradenitis suppurativa: Secondary | ICD-10-CM | POA: Diagnosis not present

## 2018-12-12 DIAGNOSIS — K7469 Other cirrhosis of liver: Secondary | ICD-10-CM | POA: Diagnosis not present

## 2018-12-12 DIAGNOSIS — Z96641 Presence of right artificial hip joint: Secondary | ICD-10-CM | POA: Diagnosis not present

## 2018-12-12 DIAGNOSIS — Z993 Dependence on wheelchair: Secondary | ICD-10-CM | POA: Diagnosis not present

## 2018-12-12 DIAGNOSIS — Z720 Tobacco use: Secondary | ICD-10-CM | POA: Diagnosis not present

## 2018-12-12 DIAGNOSIS — Z6841 Body Mass Index (BMI) 40.0 and over, adult: Secondary | ICD-10-CM | POA: Diagnosis not present

## 2018-12-12 DIAGNOSIS — D696 Thrombocytopenia, unspecified: Secondary | ICD-10-CM | POA: Diagnosis not present

## 2018-12-14 LAB — LIPID PANEL
Cholesterol: 127 (ref 0–200)
HDL: 42 (ref 35–70)
LDL Cholesterol: 71
Triglycerides: 70 (ref 40–160)

## 2018-12-18 ENCOUNTER — Telehealth: Payer: Self-pay

## 2018-12-18 DIAGNOSIS — R296 Repeated falls: Secondary | ICD-10-CM | POA: Diagnosis not present

## 2018-12-18 DIAGNOSIS — Z7982 Long term (current) use of aspirin: Secondary | ICD-10-CM | POA: Diagnosis not present

## 2018-12-18 DIAGNOSIS — Z87440 Personal history of urinary (tract) infections: Secondary | ICD-10-CM | POA: Diagnosis not present

## 2018-12-18 DIAGNOSIS — Z6838 Body mass index (BMI) 38.0-38.9, adult: Secondary | ICD-10-CM | POA: Diagnosis not present

## 2018-12-18 DIAGNOSIS — Z87891 Personal history of nicotine dependence: Secondary | ICD-10-CM | POA: Diagnosis not present

## 2018-12-18 DIAGNOSIS — Z5181 Encounter for therapeutic drug level monitoring: Secondary | ICD-10-CM | POA: Diagnosis not present

## 2018-12-18 DIAGNOSIS — G600 Hereditary motor and sensory neuropathy: Secondary | ICD-10-CM | POA: Diagnosis not present

## 2018-12-18 DIAGNOSIS — M1711 Unilateral primary osteoarthritis, right knee: Secondary | ICD-10-CM | POA: Diagnosis not present

## 2018-12-18 DIAGNOSIS — Z9114 Patient's other noncompliance with medication regimen: Secondary | ICD-10-CM | POA: Diagnosis not present

## 2018-12-18 NOTE — Telephone Encounter (Signed)
Mitzi Hansen, RN with [facility] left a VM on triage line stating the patient is about to have hip surgery and they think he needs to have PT prior to his surgery to strengthen his muscles before his surgery.  If in agreement, please call Sonya at [phone]

## 2018-12-18 NOTE — Telephone Encounter (Signed)
Left message on vm for Sonya informing her Dr. Darnell Level is giving verbal orders for services requested.

## 2018-12-18 NOTE — Telephone Encounter (Signed)
Agree with this. Thank you.  

## 2018-12-23 NOTE — Telephone Encounter (Signed)
Also, plz notify pt or his wife - labwork returned largely ok. Blood counts were all low but this is similar to prior readings. Kidneys ok. Sugar ok. Thyroid ok. Protein levels were low - needs to work on nutrition especially in setting of upcoming hip surgery.   [MELD score = 9]

## 2018-12-23 NOTE — Telephone Encounter (Signed)
Results reviewed with patient, expressed understanding.  Pt is scheduled for an appt this week 12/26/2018 - wife is concerned with patient having increased fatigue, wants him started on Iron Supplement and Folic acid again - states she will discuss at upcoming visit but wanted to give a heads up prior to the appt.  Nothing further needed.

## 2018-12-24 DIAGNOSIS — G894 Chronic pain syndrome: Secondary | ICD-10-CM | POA: Diagnosis not present

## 2018-12-24 DIAGNOSIS — Z79891 Long term (current) use of opiate analgesic: Secondary | ICD-10-CM | POA: Diagnosis not present

## 2018-12-24 DIAGNOSIS — M542 Cervicalgia: Secondary | ICD-10-CM | POA: Diagnosis not present

## 2018-12-24 DIAGNOSIS — M545 Low back pain: Secondary | ICD-10-CM | POA: Diagnosis not present

## 2018-12-26 ENCOUNTER — Encounter: Payer: Self-pay | Admitting: Family Medicine

## 2018-12-26 ENCOUNTER — Ambulatory Visit (INDEPENDENT_AMBULATORY_CARE_PROVIDER_SITE_OTHER): Payer: Medicare PPO | Admitting: Family Medicine

## 2018-12-26 ENCOUNTER — Other Ambulatory Visit: Payer: Self-pay

## 2018-12-26 VITALS — BP 118/76 | HR 69 | Temp 98.6°F | Ht 70.0 in | Wt 257.0 lb

## 2018-12-26 DIAGNOSIS — M1A9XX0 Chronic gout, unspecified, without tophus (tophi): Secondary | ICD-10-CM | POA: Diagnosis not present

## 2018-12-26 DIAGNOSIS — K7460 Unspecified cirrhosis of liver: Secondary | ICD-10-CM

## 2018-12-26 DIAGNOSIS — F332 Major depressive disorder, recurrent severe without psychotic features: Secondary | ICD-10-CM

## 2018-12-26 DIAGNOSIS — G894 Chronic pain syndrome: Secondary | ICD-10-CM

## 2018-12-26 DIAGNOSIS — M1611 Unilateral primary osteoarthritis, right hip: Secondary | ICD-10-CM

## 2018-12-26 DIAGNOSIS — J449 Chronic obstructive pulmonary disease, unspecified: Secondary | ICD-10-CM | POA: Diagnosis not present

## 2018-12-26 DIAGNOSIS — D696 Thrombocytopenia, unspecified: Secondary | ICD-10-CM

## 2018-12-26 DIAGNOSIS — E785 Hyperlipidemia, unspecified: Secondary | ICD-10-CM | POA: Diagnosis not present

## 2018-12-26 DIAGNOSIS — I700 Atherosclerosis of aorta: Secondary | ICD-10-CM

## 2018-12-26 DIAGNOSIS — I10 Essential (primary) hypertension: Secondary | ICD-10-CM

## 2018-12-26 DIAGNOSIS — L732 Hidradenitis suppurativa: Secondary | ICD-10-CM

## 2018-12-26 DIAGNOSIS — I714 Abdominal aortic aneurysm, without rupture, unspecified: Secondary | ICD-10-CM

## 2018-12-26 DIAGNOSIS — F17200 Nicotine dependence, unspecified, uncomplicated: Secondary | ICD-10-CM | POA: Diagnosis not present

## 2018-12-26 DIAGNOSIS — E559 Vitamin D deficiency, unspecified: Secondary | ICD-10-CM

## 2018-12-26 DIAGNOSIS — D649 Anemia, unspecified: Secondary | ICD-10-CM

## 2018-12-26 DIAGNOSIS — R5382 Chronic fatigue, unspecified: Secondary | ICD-10-CM

## 2018-12-26 DIAGNOSIS — R531 Weakness: Secondary | ICD-10-CM

## 2018-12-26 DIAGNOSIS — E440 Moderate protein-calorie malnutrition: Secondary | ICD-10-CM

## 2018-12-26 DIAGNOSIS — E538 Deficiency of other specified B group vitamins: Secondary | ICD-10-CM

## 2018-12-26 DIAGNOSIS — D61818 Other pancytopenia: Secondary | ICD-10-CM

## 2018-12-26 MED ORDER — CYANOCOBALAMIN 500 MCG PO TABS: 500.0000 ug | ORAL_TABLET | ORAL | Status: DC

## 2018-12-26 MED ORDER — FOLIC ACID 1 MG PO TABS: 1.0000 mg | ORAL_TABLET | Freq: Every day | ORAL | 3 refills | Status: DC

## 2018-12-26 NOTE — Assessment & Plan Note (Signed)
Down to 1/2 ppd. Aware will need to fully quit prior to surgery

## 2018-12-26 NOTE — Assessment & Plan Note (Signed)
Proceeding with recommended evaluations prior to hip replacement surgery.

## 2018-12-26 NOTE — Assessment & Plan Note (Addendum)
Cirrhosis related. Aware of increased bleeding risk due to low platelets.

## 2018-12-26 NOTE — Assessment & Plan Note (Signed)
Cirrhosis related.

## 2018-12-26 NOTE — Assessment & Plan Note (Signed)
Levels controlled on RYR.  The 10-year ASCVD risk score Mikey Bussing DC Brooke Bonito., et al., 2013) is: 27.7%   Values used to calculate the score:     Age: 63 years     Sex: Male     Is Non-Hispanic African American: No     Diabetic: Yes     Tobacco smoker: Yes     Systolic Blood Pressure: 500 mmHg     Is BP treated: Yes     HDL Cholesterol: 36.5 mg/dL     Total Cholesterol: 147 mg/dL

## 2018-12-26 NOTE — Assessment & Plan Note (Signed)
Continue 2000 IU daily.

## 2018-12-26 NOTE — Assessment & Plan Note (Signed)
MELD score 9 (12/2018) Has been cleared by [facility].

## 2018-12-26 NOTE — Assessment & Plan Note (Addendum)
Levels above normal - will decrease oral replacement to MWF.

## 2018-12-26 NOTE — Patient Instructions (Addendum)
Folic acid refilled today.  Drop b12 to 3 times a week.  Stay off irbesartan.  Work with physical therapy.  Hopeful to continue process towards goal of hip replacement.  Return in 3 months for follow up visit.

## 2018-12-26 NOTE — Assessment & Plan Note (Signed)
Endorses no recent flares.

## 2018-12-26 NOTE — Assessment & Plan Note (Signed)
Managed with PRN albuterol - anticipate weight loss has helped.

## 2018-12-26 NOTE — Progress Notes (Signed)
This visit was conducted in person.  BP 118/76 (BP Location: Left Arm, Patient Position: Sitting, Cuff Size: Normal)   Pulse 69   Temp 98.6 F (37 C) (Temporal)   Ht 5' 10"  (1.778 m)   Wt 257 lb (116.6 kg)   SpO2 96%   BMI 36.88 kg/m    CC: follow up visit Subjective:    Patient ID: [name], male    DOB: 1955/09/20, 63 y.o.   MRN: [id number]  HPI: [name] is a 63 y.o. male presenting on 12/26/2018 for Follow-up (Pt is working toward hip replacement surgery in right leg )   Complicated patient. See recent telehealth notes for details.   Working towards L hip replacement (Dr Jefferson Fuel at [facility]), ortho is understandably concerned with proceeding with surgery given his multiple comorbidities and high risk of complication/infection. However pt aware of this and continues to desire to proceed with surgery.   Ongoing fatigue, wife wants him to start iron and folic acid.  Ongoing tremors. Some upper abd discomfort described as cramping. Some nausea a few days ago - treated with zofran.   Bowels moving well with very intermittent lactulose use (about 3 times a week) prescribed by hepatology clinic.  Denies chest pain, but endorses some intermittent cough and dyspnea.   Continues cymbalta 42m daily - feels mood is improving - now feels hopeful.  HH PT coming out to house to help conditioning prior to hip replacement.  Now fully off irbesartan, blood pressures staying well controlled.   Noticing increasing nocturia.  Down to 1/2 ppd - aware will need to fully quit prior to hip surgery.  Ongoing weight loss efforts. No appetite.      Relevant past medical, surgical, family and social history reviewed and updated as indicated. Interim medical history since our last visit reviewed. Allergies and medications reviewed and updated. Outpatient Medications Prior to Visit  Medication Sig Dispense Refill  . albuterol (PROVENTIL HFA;VENTOLIN HFA) 108 (90 Base) MCG/ACT  inhaler Inhale 2 puffs into the lungs every 6 (six) hours as needed for wheezing or shortness of breath. 1 Inhaler 0  . albuterol (PROVENTIL) (2.5 MG/3ML) 0.083% nebulizer solution USE 1 VIAL PER NEBULIZER EVERY 6 HRS AS NEEDED FOR WHEEZING 75 mL 6  . aspirin 81 MG tablet Take 1 tablet (81 mg total) by mouth daily.    . B Complex-C (SUPER B COMPLEX PO) Take 1 tablet by mouth daily.    . Ca Phosphate-Cholecalciferol (619) 113-9620 MG-UNIT TABS Take by mouth.    . cholecalciferol 2000 units TABS Take 2,000 Units by mouth daily.    . Cobalamin Combinations (B-12) 1000-400 MCG SUBL Take by mouth.    . dicyclomine (BENTYL) 20 MG tablet TAKE 1 TABLET BY MOUTH 3 TIMES DAILY BEFORE MEALS. AS NEEDED FOR ABDOMINAL PAIN 30 tablet 0  . DULoxetine (CYMBALTA) 60 MG capsule Take 1 capsule (60 mg total) by mouth daily. 90 capsule 3  . fentaNYL (DURAGESIC - DOSED MCG/HR) 50 MCG/HR Place 1 patch (50 mcg total) every other day onto the skin. 15 patch 0  . fluticasone (FLONASE) 50 MCG/ACT nasal spray Place 2 sprays into both nostrils daily as needed for allergies.     .Marland Kitchenlactulose (CEPHULAC) 20 g packet Take 1 packet (20 g total) by mouth 3 (three) times daily. 30 each 0  . loperamide (IMODIUM) 2 MG capsule Take 1 capsule (2 mg total) by mouth 4 (four) times daily as needed for diarrhea or loose stools. 12  capsule 0  . metoprolol succinate (TOPROL-XL) 25 MG 24 hr tablet Take 0.5 tablets (12.5 mg total) by mouth daily. 45 tablet 3  . Misc Natural Products (TART CHERRY ADVANCED) CAPS Take 1 capsule by mouth 2 (two) times daily.    . nitroGLYCERIN (NITROSTAT) 0.4 MG SL tablet Place 1 tablet (0.4 mg total) under the tongue every 5 (five) minutes as needed for chest pain. 25 tablet 12  . nystatin (MYCOSTATIN/NYSTOP) powder Apply topically 2 (two) times daily. 60 g 11  . omeprazole (PRILOSEC) 40 MG capsule TAKE 1 CAPSULE BY MOUTH EVERY DAY 30 capsule 11  . ondansetron (ZOFRAN-ODT) 4 MG disintegrating tablet Take 1 tablet (4 mg  total) by mouth every 8 (eight) hours as needed for nausea or vomiting. Day supply per insurance. 20 tablet 3  . oxyCODONE (ROXICODONE) 15 MG immediate release tablet Take 1 tablet (15 mg total) every 4 (four) hours as needed by mouth. 180 tablet 0  . polyethylene glycol powder (GLYCOLAX/MIRALAX) powder Take 17 g by mouth daily as needed for moderate constipation. 3350 g 0  . Red Yeast Rice 600 MG CAPS Take 2 capsules by mouth daily.    . folic acid (FOLVITE) 1 MG tablet TAKE 1 TABLET BY MOUTH DAILY 30 tablet 0  . irbesartan (AVAPRO) 75 MG tablet Take 0.5 tablets (37.5 mg total) by mouth daily. 45 tablet 3  . vitamin B-12 (CYANOCOBALAMIN) 500 MCG tablet Take by mouth.     No facility-administered medications prior to visit.      Per HPI unless specifically indicated in ROS section below Review of Systems Objective:    BP 118/76 (BP Location: Left Arm, Patient Position: Sitting, Cuff Size: Normal)   Pulse 69   Temp 98.6 F (37 C) (Temporal)   Ht 5' 10"  (1.778 m)   Wt 257 lb (116.6 kg)   SpO2 96%   BMI 36.88 kg/m   Wt Readings from Last 3 Encounters:  12/26/18 257 lb (116.6 kg)  12/04/18 247 lb (112 kg)  11/05/18 248 lb (112.5 kg)    Physical Exam Vitals signs and nursing note reviewed.  Constitutional:      General: He is not in acute distress.    Appearance: Normal appearance. He is ill-appearing (chronic).     Comments: Sitting in wheelchair  HENT:     Head: Normocephalic and atraumatic.     Mouth/Throat:     Mouth: Mucous membranes are moist.     Pharynx: No posterior oropharyngeal erythema.  Eyes:     Extraocular Movements: Extraocular movements intact.  Cardiovascular:     Rate and Rhythm: Normal rate and regular rhythm.     Pulses: Normal pulses.     Heart sounds: Normal heart sounds. No murmur.  Pulmonary:     Effort: Pulmonary effort is normal. No respiratory distress.     Breath sounds: Normal breath sounds. No wheezing, rhonchi or rales.  Abdominal:      General: Abdomen is flat. Bowel sounds are normal. There is no distension.     Palpations: Abdomen is soft. There is no mass.     Tenderness: There is abdominal tenderness (mild) in the right upper quadrant and epigastric area. There is no guarding or rebound. Negative signs include Murphy's sign.     Hernia: No hernia is present.  Musculoskeletal:     Right lower leg: No edema.     Left lower leg: No edema.  Neurological:     General: No focal deficit present.  Mental Status: He is alert. Mental status is at baseline.  Psychiatric:        Mood and Affect: Mood normal.        Behavior: Behavior normal.       Assessment & Plan:   Problem List Items Addressed This Visit    Vitamin D deficiency    Continue 2000 IU daily.       Vitamin B12 deficiency    Levels above normal - will decrease oral replacement to MWF.      Thrombocytopenia ([place])    Cirrhosis related. Aware of increased bleeding risk due to low platelets.       Smoker    Down to 1/2 ppd. Aware will need to fully quit prior to surgery      Severe obesity (BMI 35.0-39.9) with comorbidity ([place])    Ongoing efforts to lose weight. Goal weight prior to surgery is <250lbs       Protein-calorie malnutrition [facility])    Reviewed importance of good nutrition prior to surgery.       Pancytopenia ([place])    Cirrhosis related.       Osteoarthritis of right hip    Proceeding with recommended evaluations prior to hip replacement surgery.       MDD (major depressive disorder), recurrent severe, without psychosis ([place])    Improvement noted today - he is feeling more hopeful that a surgery may be scheduled. Declines adjuvant medication to his cymbalta.       HLD (hyperlipidemia)    Levels controlled on RYR.  The 10-year ASCVD risk score Mikey Bussing DC Brooke Bonito., et al., 2013) is: 27.7%   Values used to calculate the score:     Age: 1 years     Sex: Male     Is Non-Hispanic African American: No     Diabetic: Yes     Tobacco  smoker: Yes     Systolic Blood Pressure: 403 mmHg     Is BP treated: Yes     HDL Cholesterol: 36.5 mg/dL     Total Cholesterol: 147 mg/dL       Hidradenitis (Chronic)    Endorses no recent flares.       Gout    Latest levels normal, no recent gout flare.       General weakness   Essential hypertension (Chronic)    Stable period off irbesartan, just on toprol XL.       COPD (chronic obstructive pulmonary disease) ([place])    Managed with PRN albuterol - anticipate weight loss has helped.       Cirrhosis of liver without ascites (HCC) - Primary    MELD score 9 (12/2018) Has been cleared by [facility].       Chronic pain syndrome   Chronic fatigue    Has not been taking folate - will restart       Aortic atherosclerosis (HCC)   Anemia    Cirrhosis related.       Relevant Medications   folic acid (FOLVITE) 1 MG tablet   vitamin B-12 (CYANOCOBALAMIN) 500 MCG tablet (Start on 12/27/2018)   Abdominal aortic aneurysm (AAA) without rupture ([place])    Upcoming f/u appt with [facility] next month.           Meds ordered this encounter  Medications  . folic acid (FOLVITE) 1 MG tablet    Sig: Take 1 tablet (1 mg total) by mouth daily.    Dispense:  90 tablet    Refill:  3  . vitamin B-12 (CYANOCOBALAMIN) 500 MCG tablet    Sig: Take 1 tablet (500 mcg total) by mouth every Monday, Wednesday, and Friday.   No orders of the defined types were placed in this encounter.   Follow up plan: Return in about 3 months (around 03/28/2019) for follow up visit.  Ria Bush, MD

## 2018-12-26 NOTE — Assessment & Plan Note (Signed)
Has not been taking folate - will restart

## 2018-12-26 NOTE — Assessment & Plan Note (Signed)
Improvement noted today - he is feeling more hopeful that a surgery may be scheduled. Declines adjuvant medication to his cymbalta.

## 2018-12-26 NOTE — Assessment & Plan Note (Signed)
Ongoing efforts to lose weight. Goal weight prior to surgery is <250lbs

## 2018-12-26 NOTE — Assessment & Plan Note (Addendum)
Latest levels normal, no recent gout flare.

## 2018-12-26 NOTE — Assessment & Plan Note (Signed)
Upcoming f/u appt with [facility] next month.

## 2018-12-26 NOTE — Assessment & Plan Note (Signed)
Reviewed importance of good nutrition prior to surgery.

## 2018-12-26 NOTE — Assessment & Plan Note (Addendum)
Stable period off irbesartan, just on toprol XL.

## 2018-12-27 ENCOUNTER — Telehealth: Payer: Self-pay | Admitting: Family Medicine

## 2018-12-27 DIAGNOSIS — M1711 Unilateral primary osteoarthritis, right knee: Secondary | ICD-10-CM | POA: Diagnosis not present

## 2018-12-27 DIAGNOSIS — Z9114 Patient's other noncompliance with medication regimen: Secondary | ICD-10-CM | POA: Diagnosis not present

## 2018-12-27 DIAGNOSIS — Z5181 Encounter for therapeutic drug level monitoring: Secondary | ICD-10-CM | POA: Diagnosis not present

## 2018-12-27 DIAGNOSIS — G600 Hereditary motor and sensory neuropathy: Secondary | ICD-10-CM | POA: Diagnosis not present

## 2018-12-27 DIAGNOSIS — Z87891 Personal history of nicotine dependence: Secondary | ICD-10-CM | POA: Diagnosis not present

## 2018-12-27 DIAGNOSIS — R296 Repeated falls: Secondary | ICD-10-CM | POA: Diagnosis not present

## 2018-12-27 DIAGNOSIS — Z6838 Body mass index (BMI) 38.0-38.9, adult: Secondary | ICD-10-CM | POA: Diagnosis not present

## 2018-12-27 DIAGNOSIS — Z7982 Long term (current) use of aspirin: Secondary | ICD-10-CM | POA: Diagnosis not present

## 2018-12-27 DIAGNOSIS — Z87440 Personal history of urinary (tract) infections: Secondary | ICD-10-CM | POA: Diagnosis not present

## 2018-12-27 NOTE — Telephone Encounter (Signed)
[place], [phone] with [facility], called to leave msg about today's visit. She is requesting verbal approval for PT Twice/wk x 3 wks Once/wk x 3wks. OK to leave a msg

## 2018-12-27 NOTE — Telephone Encounter (Signed)
Agree with this. Thanks.  

## 2018-12-30 ENCOUNTER — Telehealth: Payer: Self-pay | Admitting: *Deleted

## 2018-12-30 ENCOUNTER — Encounter: Payer: Self-pay | Admitting: Family Medicine

## 2018-12-30 NOTE — Telephone Encounter (Signed)
Patient has been notified that lung cancer screening CT scan is due currently or will be in near future. Confirmed that patient is within the appropriate age range, and asymptomatic, (no signs or symptoms of lung cancer). Patient denies illness that would prevent curative treatment for lung cancer if found. Verified smoking history (Current Smoker  Smokes 0.75ppd at this time. ). Patient is agreeable for CT scan being scheduled, would prefer morning appointment.

## 2018-12-30 NOTE — Telephone Encounter (Signed)
Spoke with [place] her Dr. Darnell Level is giving verbal orders for services requested.

## 2018-12-31 DIAGNOSIS — Z87891 Personal history of nicotine dependence: Secondary | ICD-10-CM | POA: Diagnosis not present

## 2018-12-31 DIAGNOSIS — M1711 Unilateral primary osteoarthritis, right knee: Secondary | ICD-10-CM | POA: Diagnosis not present

## 2018-12-31 DIAGNOSIS — M1611 Unilateral primary osteoarthritis, right hip: Secondary | ICD-10-CM | POA: Diagnosis not present

## 2018-12-31 DIAGNOSIS — G600 Hereditary motor and sensory neuropathy: Secondary | ICD-10-CM | POA: Diagnosis not present

## 2018-12-31 DIAGNOSIS — Z6838 Body mass index (BMI) 38.0-38.9, adult: Secondary | ICD-10-CM | POA: Diagnosis not present

## 2018-12-31 DIAGNOSIS — Z87440 Personal history of urinary (tract) infections: Secondary | ICD-10-CM | POA: Diagnosis not present

## 2018-12-31 DIAGNOSIS — Z5181 Encounter for therapeutic drug level monitoring: Secondary | ICD-10-CM | POA: Diagnosis not present

## 2018-12-31 DIAGNOSIS — Z7982 Long term (current) use of aspirin: Secondary | ICD-10-CM | POA: Diagnosis not present

## 2018-12-31 DIAGNOSIS — R296 Repeated falls: Secondary | ICD-10-CM | POA: Diagnosis not present

## 2018-12-31 DIAGNOSIS — R069 Unspecified abnormalities of breathing: Secondary | ICD-10-CM | POA: Diagnosis not present

## 2018-12-31 DIAGNOSIS — Z9114 Patient's other noncompliance with medication regimen: Secondary | ICD-10-CM | POA: Diagnosis not present

## 2019-01-01 DIAGNOSIS — Z79891 Long term (current) use of opiate analgesic: Secondary | ICD-10-CM

## 2019-01-01 DIAGNOSIS — M1711 Unilateral primary osteoarthritis, right knee: Secondary | ICD-10-CM | POA: Diagnosis not present

## 2019-01-01 DIAGNOSIS — Z9114 Patient's other noncompliance with medication regimen: Secondary | ICD-10-CM | POA: Diagnosis not present

## 2019-01-01 DIAGNOSIS — Z6838 Body mass index (BMI) 38.0-38.9, adult: Secondary | ICD-10-CM | POA: Diagnosis not present

## 2019-01-01 DIAGNOSIS — Z5181 Encounter for therapeutic drug level monitoring: Secondary | ICD-10-CM | POA: Diagnosis not present

## 2019-01-01 DIAGNOSIS — Z87891 Personal history of nicotine dependence: Secondary | ICD-10-CM | POA: Diagnosis not present

## 2019-01-01 DIAGNOSIS — Z7982 Long term (current) use of aspirin: Secondary | ICD-10-CM | POA: Diagnosis not present

## 2019-01-01 DIAGNOSIS — Z7951 Long term (current) use of inhaled steroids: Secondary | ICD-10-CM

## 2019-01-01 DIAGNOSIS — Z87440 Personal history of urinary (tract) infections: Secondary | ICD-10-CM | POA: Diagnosis not present

## 2019-01-01 DIAGNOSIS — G600 Hereditary motor and sensory neuropathy: Secondary | ICD-10-CM | POA: Diagnosis not present

## 2019-01-01 DIAGNOSIS — R296 Repeated falls: Secondary | ICD-10-CM | POA: Diagnosis not present

## 2019-01-02 DIAGNOSIS — R296 Repeated falls: Secondary | ICD-10-CM | POA: Diagnosis not present

## 2019-01-02 DIAGNOSIS — M1711 Unilateral primary osteoarthritis, right knee: Secondary | ICD-10-CM | POA: Diagnosis not present

## 2019-01-02 DIAGNOSIS — Z9114 Patient's other noncompliance with medication regimen: Secondary | ICD-10-CM | POA: Diagnosis not present

## 2019-01-02 DIAGNOSIS — Z5181 Encounter for therapeutic drug level monitoring: Secondary | ICD-10-CM | POA: Diagnosis not present

## 2019-01-02 DIAGNOSIS — Z6838 Body mass index (BMI) 38.0-38.9, adult: Secondary | ICD-10-CM | POA: Diagnosis not present

## 2019-01-02 DIAGNOSIS — G600 Hereditary motor and sensory neuropathy: Secondary | ICD-10-CM | POA: Diagnosis not present

## 2019-01-02 DIAGNOSIS — Z87891 Personal history of nicotine dependence: Secondary | ICD-10-CM | POA: Diagnosis not present

## 2019-01-02 DIAGNOSIS — Z7982 Long term (current) use of aspirin: Secondary | ICD-10-CM | POA: Diagnosis not present

## 2019-01-02 DIAGNOSIS — Z87440 Personal history of urinary (tract) infections: Secondary | ICD-10-CM | POA: Diagnosis not present

## 2019-01-03 ENCOUNTER — Other Ambulatory Visit: Payer: Self-pay | Admitting: *Deleted

## 2019-01-03 DIAGNOSIS — Z87891 Personal history of nicotine dependence: Secondary | ICD-10-CM

## 2019-01-03 DIAGNOSIS — Z122 Encounter for screening for malignant neoplasm of respiratory organs: Secondary | ICD-10-CM

## 2019-01-06 ENCOUNTER — Other Ambulatory Visit: Payer: Self-pay

## 2019-01-06 ENCOUNTER — Encounter (INDEPENDENT_AMBULATORY_CARE_PROVIDER_SITE_OTHER): Payer: Self-pay | Admitting: Vascular Surgery

## 2019-01-06 ENCOUNTER — Ambulatory Visit (INDEPENDENT_AMBULATORY_CARE_PROVIDER_SITE_OTHER): Payer: Medicare PPO | Admitting: Vascular Surgery

## 2019-01-06 ENCOUNTER — Ambulatory Visit (INDEPENDENT_AMBULATORY_CARE_PROVIDER_SITE_OTHER): Payer: Medicare PPO

## 2019-01-06 VITALS — BP 141/82 | HR 67 | Resp 16

## 2019-01-06 DIAGNOSIS — I714 Abdominal aortic aneurysm, without rupture, unspecified: Secondary | ICD-10-CM

## 2019-01-06 DIAGNOSIS — E785 Hyperlipidemia, unspecified: Secondary | ICD-10-CM

## 2019-01-06 DIAGNOSIS — I10 Essential (primary) hypertension: Secondary | ICD-10-CM | POA: Diagnosis not present

## 2019-01-06 DIAGNOSIS — I2510 Atherosclerotic heart disease of native coronary artery without angina pectoris: Secondary | ICD-10-CM | POA: Diagnosis not present

## 2019-01-06 DIAGNOSIS — I700 Atherosclerosis of aorta: Secondary | ICD-10-CM | POA: Diagnosis not present

## 2019-01-06 NOTE — Progress Notes (Signed)
MRN : [id number]  [name] is a 63 y.o. (1955/11/27) male who presents with chief complaint of No chief complaint on file. [name]  History of Present Illness:   The patient is seen for follow up evaluation of AAA status post duplex ultrasound. The patient denies interval development of abdominal or back pain. No new lower extremity pain or discoloration of the toes.   The patient has a history of coronary artery disease, no recent episodes of angina or shortness of breath. The patient denies interval anaurosis fugax. There is o recent history of TIA symptoms or focal motor deficits. The patient denies PAD or claudication symptoms. There is a history of hyperlipidemia which is being treated with a statin.   Previous CT angiography of the abdomen and pelvis shows a patent endograft with suprarenal fixation there is moderate stenosis of the celiac which is noted in prior scans unchanged SMA is patent the bilateral renal stents are patent the infrarenal AAA sac measures 2.6 cm.  No endo-leaks are noted.  Duplex ultrasound of the aorta iliac arteries shows a Stent graft in good position no endoleak with a sac size of 3.2 cm  No outpatient medications have been marked as taking for the 01/06/19 encounter (Appointment) with Delana Meyer, Dolores Lory, MD.    Past Medical History:  Diagnosis Date  . AAA (abdominal aortic aneurysm) ([place]) 09/2012--  monitored by dr Trula Slade   stable 5.6cm CTA abdomen 2016  . Abnormal drug screen 07/09/2016   1/2/018 - positive oxycodone, fentanyl, inapprop positive MJ - mod risk  . Allergic rhinitis   . B12 deficiency   . CAD (coronary artery disease) cardiologist-  dr Stanford Breed   x3 with stents last 2005, EF 40%, predominantly RCA by CT 2016  . Cataracts, bilateral   . Cervical spondylosis 05/2010   s/p surgery  . Charcot Marie Tooth muscular atrophy dx  [phone]   neurologist--  dr love--  type 2 per pt  . Chronic pain syndrome    established with [facility] ([place]) --> disagreement and transfered care to Dr Sanjuan Dame at [facility], requests PCP write Rx but f/u with pain clinic Q6-12 months  . COPD (chronic obstructive pulmonary disease) ([place]) 10/2011   minimal by PFTs  . DDD (degenerative disc disease)   . Disturbances of sensation of smell and taste    improving  . Dyspnea on exertion   . GERD (gastroesophageal reflux disease)   . Gout   . Headache   . Hepatitis    hepatitis B  . Hidradenitis    right groin  . Hidradenitis suppurativa dx 2011   goin and leg crease   followd by Lyndle Herrlich - daily bactrim, s/p intralesional steroid injection 10/2010  . Hip osteoarthritis    s/p intraarticular steroid shot (12/2012) ([place])  . History of hepatitis B 1983  . History of MI (myocardial infarction)    2000  &  2005  . History of pneumonia   . History of viral meningitis 2000  . HLD (hyperlipidemia)   . HTN (hypertension)   . Ischemic cardiomyopathy    s/p inferior MI  --  current ef per myoview 39%  . Liver cirrhosis secondary to NASH ([place]) 01/2014   by CT scan, rec virtual colonoscopy by Dr Collene Mares 06/2014  . Lumbar herniated disc   . Myocardial infarction ([place])    x2  . Nocturia more than twice per night   . Obesity   .  Spinal stenosis    released from [place].  established with preferred pain (07/2013)  . T2DM (type 2 diabetes mellitus) ([place])    ABIs WNL 2016  . Vitamin D deficiency     Past Surgical History:  Procedure Laterality Date  . ABDOMINAL AORTIC ENDOVASCULAR FENESTRATED STENT GRAFT N/A 11/30/2015   Procedure: ABDOMINAL AORTIC ENDOVASCULAR FENESTRATED STENT GRAFT;  Surgeon: Serafina Mitchell, MD;  Location: [facility];  Service: Vascular;  Laterality: N/A;  . ANTERIOR CERVICAL DECOMP/DISCECTOMY FUSION  01-07-2010    C4 -- C7  . CARDIAC CATHETERIZATION  03-30-2005  dr Albertine Patricia   ef 40% w/ inferior akinesis/  LM and CFX angiographically normal/  pLAD 30%/   Widely patent stents in RCA and PDA widely patent   . CARDIOVASCULAR STRESS TEST  10-23-2012  dr Stanford Breed   No ischemia/  Moderate scar in the inferior wall, otherwise normal perfusion/  LV ef 39%,  LV wall motion: inferior/ inferolateral hypokinesis  . COLONOSCOPY  05/06/2007   normal, small int hemorrhoids rpt 5 yrs due to fmhx - rec against rpt colonoscopy by Dr Collene Mares  . CORONARY ANGIOPLASTY  2000  dr Stanford Breed   PCI to RCA and PDA  . CORONARY ANGIOPLASTY WITH STENT PLACEMENT  03-19-2005  dr Gwyndolyn Saxon downey   inferior STEMI--- DES x4 to RCA w/ balloon angioplasty and balloon angioplasty to jailed PDA ostium/  severe hypokinesis of midinferor wall, ef 50%/  dLM 20%,  mLAD 20%,  dCFX 60%  . ESOPHAGOGASTRODUODENOSCOPY  01/2017   dilated benign esophageal stenosis, portal hypertensive gastropathy Henrene Pastor)  . ESOPHAGOGASTRODUODENOSCOPY (EGD) WITH PROPOFOL N/A 02/20/2018   benign biopsy [name], MD)  . HYDRADENITIS EXCISION Right 12/31/2014   Procedure: WIDE EXCISION HIDRADENITIS GROIN; Coralie Keens, MD  . LUMBAR DISC SURGERY     L5-S1  . LUMBAR LAMINECTOMY  05-18-2010   L2--5   laminectomy/foraminotomy for stenosis Joya Salm)  . MYELOGRAM     L5-S1 and L1-2 spondylosis  . SACROILIAC JOINT INJECTION Bilateral 10/2013   [place]  . TONSILLECTOMY AND ADENOIDECTOMY  1972    Social History Social History   Tobacco Use  . Smoking status: Current Some Day Smoker    Packs/day: 1.00    Years: 57.00    Pack years: 57.00    Types: Cigarettes, E-cigarettes    Start date: 06/06/1967    Last attempt to quit: 02/20/2018    Years since quitting: 0.8  . Smokeless tobacco: Never Used  . Tobacco comment: stopped smoking a pipe in 2015  DOES SMOKE E CIG  Substance Use Topics  . Alcohol use: No    Alcohol/week: 0.0 standard drinks  . Drug use: Yes    Types: Fentanyl, Hydrocodone    Family History Family History  Problem Relation Age of Onset  . Cancer Mother        colon  . Diabetes Mother   . Kidney disease Mother   . Aneurysm Mother         AAA  . Rheum arthritis Mother   . Charcot-Marie-Tooth disease Mother   . Heart disease Mother        before age 55  . Cancer Father        skin  . Heart attack Father   . Heart disease Father        before age 22  . Cancer Brother        skin  . Coronary artery disease Brother   . Cancer Brother  small cell lung cancer  . Aneurysm Brother        AAA  . Rheum arthritis Sister   . Rheum arthritis Brother   . Prostate cancer Neg Hx   . Bladder Cancer Neg Hx   . Kidney cancer Neg Hx     Allergies  Allergen Reactions  . Statins Shortness Of Breath    Cough, trouble breathing Cough, trouble breathing  . Losartan Other (See Comments)    Causes him to have pain  . Penicillins   . Tramadol Nausea Only  . Allopurinol Nausea Only  . Baclofen Nausea And Vomiting  . Gabapentin Nausea And Vomiting     REVIEW OF SYSTEMS (Negative unless checked)  Constitutional: [] Weight loss  [] Fever  [] Chills Cardiac: [] Chest pain   [] Chest pressure   [] Palpitations   [] Shortness of breath when laying flat   [] Shortness of breath with exertion. Vascular:  [] Pain in legs with walking   [] Pain in legs at rest  [] History of DVT   [] Phlebitis   [] Swelling in legs   [] Varicose veins   [] Non-healing ulcers Pulmonary:   [] Uses home oxygen   [] Productive cough   [] Hemoptysis   [] Wheeze  [] COPD   [] Asthma Neurologic:  [] Dizziness   [] Seizures   [] History of stroke   [] History of TIA  [] Aphasia   [] Vissual changes   [] Weakness or numbness in arm   [] Weakness or numbness in leg Musculoskeletal:   [] Joint swelling   [] Joint pain   [] Low back pain Hematologic:  [] Easy bruising  [] Easy bleeding   [] Hypercoagulable state   [] Anemic Gastrointestinal:  [] Diarrhea   [] Vomiting  [] Gastroesophageal reflux/heartburn   [] Difficulty swallowing. Genitourinary:  [] Chronic kidney disease   [] Difficult urination  [] Frequent urination   [] Blood in urine Skin:  [] Rashes   [] Ulcers  Psychological:  [] History of anxiety    []  History of major depression.  Physical Examination  There were no vitals filed for this visit. There is no height or weight on file to calculate BMI. Gen: WD/WN, NAD Head: [place]/AT, No temporalis wasting.  Ear/Nose/Throat: Hearing grossly intact, nares w/o erythema or drainage Eyes: PER, EOMI, sclera nonicteric.  Neck: Supple, no large masses.   Pulmonary:  Good air movement, no audible wheezing bilaterally, no use of accessory muscles.  Cardiac: RRR, no JVD Vascular:  Vessel Right Left  Radial Palpable Palpable  PT Palpable Palpable  DP Palpable Palpable  Gastrointestinal: Non-distended. No guarding/no peritoneal signs.  Musculoskeletal: M/S 5/5 throughout.  No deformity or atrophy.  Neurologic: CN 2-12 intact. Symmetrical.  Speech is fluent. Motor exam as listed above. Psychiatric: Judgment intact, Mood & affect appropriate for pt's clinical situation. Dermatologic: No rashes or ulcers noted.  No changes consistent with cellulitis. Lymph : No lichenification or skin changes of chronic lymphedema.  CBC Lab Results  Component Value Date   WBC 3.3 12/10/2018   HGB 10.8 (A) 12/10/2018   HCT 33.9 (L) 03/19/2018   MCV 103.0 (H) 03/19/2018   PLT 48 (A) 12/10/2018    BMET    Component Value Date/Time   NA 137 12/10/2018   K 3.8 12/10/2018   CL 103 02/13/2018 1114   CO2 24 02/13/2018 1114   GLUCOSE 135 (H) 02/13/2018 1114   GLUCOSE 86 11/15/2017 1100   BUN 16 02/13/2018 1114   CREATININE 0.6 12/10/2018   CREATININE 1.03 02/13/2018 1114   CREATININE 0.80 04/09/2015 1530   CALCIUM 8.7 02/13/2018 1114   GFRNONAA 77 02/13/2018 1114   GFRNONAA >89 01/05/2014  Startup 02/13/2018 1114   GFRAA >89 01/05/2014 1159   CrCl cannot be calculated (Patient's most recent lab result is older than the maximum 21 days allowed.).  COAG Lab Results  Component Value Date   INR 1.25 03/19/2018   INR 1.2 02/13/2018   INR 1.3 (H) 03/13/2017    Radiology No results found.    Assessment/Plan 1. Abdominal aortic aneurysm (AAA) without rupture ([place]) Recommend: The aneurysm is > 5 cm and therefore should undergo repair. Patient is status post CT scan of the abdominal aorta. The patient is a candidate for endovascular repair.   He will require cardiac clearance.   The patient will continue antiplatelet therapy as prescribed (since the patient is undergoing endovascular repair as opposed to open repair) as well as aggressive management of hyperlipidemia. Exercise is again strongly encouraged.   The patient is reminded that lifetime routine surveillance is a necessity with an endograft.   The risks and benefits of AAA repair are reviewed with the patient.  All questions are answered.  Alternative therapies are also discussed.  The patient agrees to proceed with endovascular aneurysm repair.  With respect to the patient's right hip replacement surgery.  He is scheduled to undergo a total hip replacement by Dr. Jefferson Fuel in the near future.  He is cleared for this procedure from a vascular standpoint.  His stent graft is well positioned and his aneurysm is been well treated.  There should be no vascular contraindications to orthopedic surgery.  Patient will follow-up with me in the office after the surgery. - VAS US AORTA/IVC/ILIACS; Future  2. Aortic atherosclerosis (HCC)  Recommend:  The patient has evidence of atherosclerosis of the lower extremities with claudication.  The patient does not voice lifestyle limiting changes at this point in time.  Noninvasive studies do not suggest clinically significant change.  No invasive studies, angiography or surgery at this time The patient should continue walking and begin a more formal exercise program.  The patient should continue antiplatelet therapy and aggressive treatment of the lipid abnormalities  No changes in the patient's medications at this time  The patient should continue wearing graduated compression socks  10-15 mmHg strength to control the mild edema.    3. Coronary artery disease involving native coronary artery of native heart without angina pectoris Continue cardiac and antihypertensive medications as already ordered and reviewed, no changes at this time.  Continue statin as ordered and reviewed, no changes at this time  Nitrates PRN for chest pain   4. Essential hypertension Continue antihypertensive medications as already ordered, these medications have been reviewed and there are no changes at this time.   5. Hyperlipidemia, unspecified hyperlipidemia type Continue statin as ordered and reviewed, no changes at this time     Hortencia Pilar, MD  01/06/2019 8:35 AM

## 2019-01-07 DIAGNOSIS — Z7982 Long term (current) use of aspirin: Secondary | ICD-10-CM | POA: Diagnosis not present

## 2019-01-07 DIAGNOSIS — Z87440 Personal history of urinary (tract) infections: Secondary | ICD-10-CM | POA: Diagnosis not present

## 2019-01-07 DIAGNOSIS — M1711 Unilateral primary osteoarthritis, right knee: Secondary | ICD-10-CM | POA: Diagnosis not present

## 2019-01-07 DIAGNOSIS — Z6838 Body mass index (BMI) 38.0-38.9, adult: Secondary | ICD-10-CM | POA: Diagnosis not present

## 2019-01-07 DIAGNOSIS — Z9114 Patient's other noncompliance with medication regimen: Secondary | ICD-10-CM | POA: Diagnosis not present

## 2019-01-07 DIAGNOSIS — Z5181 Encounter for therapeutic drug level monitoring: Secondary | ICD-10-CM | POA: Diagnosis not present

## 2019-01-07 DIAGNOSIS — G600 Hereditary motor and sensory neuropathy: Secondary | ICD-10-CM | POA: Diagnosis not present

## 2019-01-07 DIAGNOSIS — R296 Repeated falls: Secondary | ICD-10-CM | POA: Diagnosis not present

## 2019-01-07 DIAGNOSIS — Z87891 Personal history of nicotine dependence: Secondary | ICD-10-CM | POA: Diagnosis not present

## 2019-01-08 ENCOUNTER — Ambulatory Visit: Admission: RE | Admit: 2019-01-08 | Payer: Medicare PPO | Source: Ambulatory Visit

## 2019-01-09 DIAGNOSIS — Z9114 Patient's other noncompliance with medication regimen: Secondary | ICD-10-CM | POA: Diagnosis not present

## 2019-01-09 DIAGNOSIS — Z7982 Long term (current) use of aspirin: Secondary | ICD-10-CM | POA: Diagnosis not present

## 2019-01-09 DIAGNOSIS — Z87440 Personal history of urinary (tract) infections: Secondary | ICD-10-CM | POA: Diagnosis not present

## 2019-01-09 DIAGNOSIS — R296 Repeated falls: Secondary | ICD-10-CM | POA: Diagnosis not present

## 2019-01-09 DIAGNOSIS — G600 Hereditary motor and sensory neuropathy: Secondary | ICD-10-CM | POA: Diagnosis not present

## 2019-01-09 DIAGNOSIS — Z87891 Personal history of nicotine dependence: Secondary | ICD-10-CM | POA: Diagnosis not present

## 2019-01-09 DIAGNOSIS — Z6838 Body mass index (BMI) 38.0-38.9, adult: Secondary | ICD-10-CM | POA: Diagnosis not present

## 2019-01-09 DIAGNOSIS — M1711 Unilateral primary osteoarthritis, right knee: Secondary | ICD-10-CM | POA: Diagnosis not present

## 2019-01-09 DIAGNOSIS — Z5181 Encounter for therapeutic drug level monitoring: Secondary | ICD-10-CM | POA: Diagnosis not present

## 2019-01-13 DIAGNOSIS — G600 Hereditary motor and sensory neuropathy: Secondary | ICD-10-CM | POA: Diagnosis not present

## 2019-01-13 DIAGNOSIS — Z9114 Patient's other noncompliance with medication regimen: Secondary | ICD-10-CM | POA: Diagnosis not present

## 2019-01-13 DIAGNOSIS — Z5181 Encounter for therapeutic drug level monitoring: Secondary | ICD-10-CM | POA: Diagnosis not present

## 2019-01-13 DIAGNOSIS — Z7982 Long term (current) use of aspirin: Secondary | ICD-10-CM | POA: Diagnosis not present

## 2019-01-13 DIAGNOSIS — M1711 Unilateral primary osteoarthritis, right knee: Secondary | ICD-10-CM | POA: Diagnosis not present

## 2019-01-13 DIAGNOSIS — Z87891 Personal history of nicotine dependence: Secondary | ICD-10-CM | POA: Diagnosis not present

## 2019-01-13 DIAGNOSIS — Z6838 Body mass index (BMI) 38.0-38.9, adult: Secondary | ICD-10-CM | POA: Diagnosis not present

## 2019-01-13 DIAGNOSIS — Z87440 Personal history of urinary (tract) infections: Secondary | ICD-10-CM | POA: Diagnosis not present

## 2019-01-13 DIAGNOSIS — R296 Repeated falls: Secondary | ICD-10-CM | POA: Diagnosis not present

## 2019-01-15 DIAGNOSIS — Z87440 Personal history of urinary (tract) infections: Secondary | ICD-10-CM | POA: Diagnosis not present

## 2019-01-15 DIAGNOSIS — Z5181 Encounter for therapeutic drug level monitoring: Secondary | ICD-10-CM | POA: Diagnosis not present

## 2019-01-15 DIAGNOSIS — Z6838 Body mass index (BMI) 38.0-38.9, adult: Secondary | ICD-10-CM | POA: Diagnosis not present

## 2019-01-15 DIAGNOSIS — M1611 Unilateral primary osteoarthritis, right hip: Secondary | ICD-10-CM | POA: Diagnosis not present

## 2019-01-15 DIAGNOSIS — Z9114 Patient's other noncompliance with medication regimen: Secondary | ICD-10-CM | POA: Diagnosis not present

## 2019-01-15 DIAGNOSIS — Z6841 Body Mass Index (BMI) 40.0 and over, adult: Secondary | ICD-10-CM | POA: Diagnosis not present

## 2019-01-15 DIAGNOSIS — Z87891 Personal history of nicotine dependence: Secondary | ICD-10-CM | POA: Diagnosis not present

## 2019-01-15 DIAGNOSIS — G600 Hereditary motor and sensory neuropathy: Secondary | ICD-10-CM | POA: Diagnosis not present

## 2019-01-15 DIAGNOSIS — Z7982 Long term (current) use of aspirin: Secondary | ICD-10-CM | POA: Diagnosis not present

## 2019-01-15 DIAGNOSIS — R296 Repeated falls: Secondary | ICD-10-CM | POA: Diagnosis not present

## 2019-01-15 DIAGNOSIS — M1711 Unilateral primary osteoarthritis, right knee: Secondary | ICD-10-CM | POA: Diagnosis not present

## 2019-01-23 DIAGNOSIS — G600 Hereditary motor and sensory neuropathy: Secondary | ICD-10-CM | POA: Diagnosis not present

## 2019-01-23 DIAGNOSIS — Z87891 Personal history of nicotine dependence: Secondary | ICD-10-CM | POA: Diagnosis not present

## 2019-01-23 DIAGNOSIS — Z87440 Personal history of urinary (tract) infections: Secondary | ICD-10-CM | POA: Diagnosis not present

## 2019-01-23 DIAGNOSIS — Z6838 Body mass index (BMI) 38.0-38.9, adult: Secondary | ICD-10-CM | POA: Diagnosis not present

## 2019-01-23 DIAGNOSIS — Z5181 Encounter for therapeutic drug level monitoring: Secondary | ICD-10-CM | POA: Diagnosis not present

## 2019-01-23 DIAGNOSIS — Z9114 Patient's other noncompliance with medication regimen: Secondary | ICD-10-CM | POA: Diagnosis not present

## 2019-01-23 DIAGNOSIS — Z7982 Long term (current) use of aspirin: Secondary | ICD-10-CM | POA: Diagnosis not present

## 2019-01-23 DIAGNOSIS — R296 Repeated falls: Secondary | ICD-10-CM | POA: Diagnosis not present

## 2019-01-23 DIAGNOSIS — M1711 Unilateral primary osteoarthritis, right knee: Secondary | ICD-10-CM | POA: Diagnosis not present

## 2019-01-28 DIAGNOSIS — Z87440 Personal history of urinary (tract) infections: Secondary | ICD-10-CM | POA: Diagnosis not present

## 2019-01-28 DIAGNOSIS — Z87891 Personal history of nicotine dependence: Secondary | ICD-10-CM | POA: Diagnosis not present

## 2019-01-28 DIAGNOSIS — Z6838 Body mass index (BMI) 38.0-38.9, adult: Secondary | ICD-10-CM | POA: Diagnosis not present

## 2019-01-28 DIAGNOSIS — Z9114 Patient's other noncompliance with medication regimen: Secondary | ICD-10-CM | POA: Diagnosis not present

## 2019-01-28 DIAGNOSIS — M1711 Unilateral primary osteoarthritis, right knee: Secondary | ICD-10-CM | POA: Diagnosis not present

## 2019-01-28 DIAGNOSIS — R296 Repeated falls: Secondary | ICD-10-CM | POA: Diagnosis not present

## 2019-01-28 DIAGNOSIS — G600 Hereditary motor and sensory neuropathy: Secondary | ICD-10-CM | POA: Diagnosis not present

## 2019-01-28 DIAGNOSIS — Z5181 Encounter for therapeutic drug level monitoring: Secondary | ICD-10-CM | POA: Diagnosis not present

## 2019-01-28 DIAGNOSIS — Z7982 Long term (current) use of aspirin: Secondary | ICD-10-CM | POA: Diagnosis not present

## 2019-01-29 DIAGNOSIS — Z87440 Personal history of urinary (tract) infections: Secondary | ICD-10-CM | POA: Diagnosis not present

## 2019-01-29 DIAGNOSIS — Z87891 Personal history of nicotine dependence: Secondary | ICD-10-CM | POA: Diagnosis not present

## 2019-01-29 DIAGNOSIS — Z7982 Long term (current) use of aspirin: Secondary | ICD-10-CM | POA: Diagnosis not present

## 2019-01-29 DIAGNOSIS — M1711 Unilateral primary osteoarthritis, right knee: Secondary | ICD-10-CM | POA: Diagnosis not present

## 2019-01-29 DIAGNOSIS — Z9114 Patient's other noncompliance with medication regimen: Secondary | ICD-10-CM | POA: Diagnosis not present

## 2019-01-29 DIAGNOSIS — R296 Repeated falls: Secondary | ICD-10-CM | POA: Diagnosis not present

## 2019-01-29 DIAGNOSIS — Z6838 Body mass index (BMI) 38.0-38.9, adult: Secondary | ICD-10-CM | POA: Diagnosis not present

## 2019-01-29 DIAGNOSIS — G600 Hereditary motor and sensory neuropathy: Secondary | ICD-10-CM | POA: Diagnosis not present

## 2019-01-29 DIAGNOSIS — Z5181 Encounter for therapeutic drug level monitoring: Secondary | ICD-10-CM | POA: Diagnosis not present

## 2019-01-31 DIAGNOSIS — R069 Unspecified abnormalities of breathing: Secondary | ICD-10-CM | POA: Diagnosis not present

## 2019-01-31 DIAGNOSIS — M1611 Unilateral primary osteoarthritis, right hip: Secondary | ICD-10-CM | POA: Diagnosis not present

## 2019-01-31 DIAGNOSIS — R296 Repeated falls: Secondary | ICD-10-CM | POA: Diagnosis not present

## 2019-01-31 DIAGNOSIS — G600 Hereditary motor and sensory neuropathy: Secondary | ICD-10-CM | POA: Diagnosis not present

## 2019-02-03 ENCOUNTER — Telehealth: Payer: Self-pay | Admitting: Family Medicine

## 2019-02-03 NOTE — Telephone Encounter (Signed)
Left message on vm per dpr asking pt's wife, [name] (on dpr), to call back and schedule and in office visit (due to fall), if possible.  If not, virtual is fine.

## 2019-02-03 NOTE — Telephone Encounter (Signed)
Patient's wife, [name], called.Patient slid off his bed twice this weekend.  Patient's knees are cut open and a place on his hip.  His wife has been treating it with peroxide and Neosporin.  The mattress he sleeps on has a dent on the side where he sits and they can't afford a new mattress. It's a pillow top mattress, so they can't flip the mattress.  Patient's wife wants to know if he can get a rx for a hospital bed.  Patient's been told there's a 100% chance of infection if he has a hip replacement. Patient doesn't want to take the chance for infection.

## 2019-02-04 ENCOUNTER — Encounter: Payer: Self-pay | Admitting: Family Medicine

## 2019-02-04 ENCOUNTER — Ambulatory Visit (INDEPENDENT_AMBULATORY_CARE_PROVIDER_SITE_OTHER): Payer: Medicare PPO | Admitting: Family Medicine

## 2019-02-04 ENCOUNTER — Telehealth: Payer: Self-pay

## 2019-02-04 VITALS — BP 170/90 | HR 80 | Temp 98.3°F | Ht 70.0 in | Wt 262.0 lb

## 2019-02-04 DIAGNOSIS — W19XXXA Unspecified fall, initial encounter: Secondary | ICD-10-CM | POA: Insufficient documentation

## 2019-02-04 DIAGNOSIS — Z87440 Personal history of urinary (tract) infections: Secondary | ICD-10-CM | POA: Diagnosis not present

## 2019-02-04 DIAGNOSIS — I10 Essential (primary) hypertension: Secondary | ICD-10-CM | POA: Diagnosis not present

## 2019-02-04 DIAGNOSIS — G600 Hereditary motor and sensory neuropathy: Secondary | ICD-10-CM | POA: Diagnosis not present

## 2019-02-04 DIAGNOSIS — R531 Weakness: Secondary | ICD-10-CM | POA: Diagnosis not present

## 2019-02-04 DIAGNOSIS — F332 Major depressive disorder, recurrent severe without psychotic features: Secondary | ICD-10-CM | POA: Diagnosis not present

## 2019-02-04 DIAGNOSIS — L732 Hidradenitis suppurativa: Secondary | ICD-10-CM | POA: Diagnosis not present

## 2019-02-04 DIAGNOSIS — Z9114 Patient's other noncompliance with medication regimen: Secondary | ICD-10-CM | POA: Diagnosis not present

## 2019-02-04 DIAGNOSIS — M1711 Unilateral primary osteoarthritis, right knee: Secondary | ICD-10-CM | POA: Diagnosis not present

## 2019-02-04 DIAGNOSIS — Z87891 Personal history of nicotine dependence: Secondary | ICD-10-CM | POA: Diagnosis not present

## 2019-02-04 DIAGNOSIS — E440 Moderate protein-calorie malnutrition: Secondary | ICD-10-CM

## 2019-02-04 DIAGNOSIS — K7460 Unspecified cirrhosis of liver: Secondary | ICD-10-CM | POA: Diagnosis not present

## 2019-02-04 DIAGNOSIS — Z6838 Body mass index (BMI) 38.0-38.9, adult: Secondary | ICD-10-CM | POA: Diagnosis not present

## 2019-02-04 DIAGNOSIS — M1611 Unilateral primary osteoarthritis, right hip: Secondary | ICD-10-CM

## 2019-02-04 DIAGNOSIS — G894 Chronic pain syndrome: Secondary | ICD-10-CM

## 2019-02-04 DIAGNOSIS — Z7982 Long term (current) use of aspirin: Secondary | ICD-10-CM | POA: Diagnosis not present

## 2019-02-04 DIAGNOSIS — Z5181 Encounter for therapeutic drug level monitoring: Secondary | ICD-10-CM | POA: Diagnosis not present

## 2019-02-04 DIAGNOSIS — R296 Repeated falls: Secondary | ICD-10-CM

## 2019-02-04 NOTE — Telephone Encounter (Signed)
Spoke with [name] informing her Dr. Darnell Level is giving verbal order for services requested.

## 2019-02-04 NOTE — Telephone Encounter (Signed)
Ok to do this. Thank you.

## 2019-02-04 NOTE — Progress Notes (Signed)
Virtual visit completed through [employer]. Due to national recommendations of social distancing due to COVID-19, a virtual visit is felt to be most appropriate for this patient at this time. Reviewed limitations of a virtual visit.   Patient location: home, Stacy PT with [facility] also present on call Provider location: [place] [facility], office If any vitals were documented, they were collected by patient at home unless specified below.    BP (!) 170/90 (BP Location: Right Arm, Patient Position: Sitting)   Pulse 80   Temp 98.3 F (36.8 C)   Ht 5' 10"  (1.778 m)   Wt 262 lb (118.8 kg)   SpO2 96%   BMI 37.59 kg/m    CC: R hip pain after fall Subjective:    Patient ID: [name], male    DOB: 01-03-1956, 63 y.o.   MRN: [id number]  HPI: [name] is a 64 y.o. male presenting on 02/04/2019 for Hip Pain (C/o right hip pain.), Prescription (Requests rx for hospital bed. Has appt for hip inj this month. ), and Fall (Per pt's wife, [name], pt slipped off of porch on 01/16/19.  Then he fell out of bed twice this weekend. Has abrasions to bilateral knees. )   See recent notes for details.  Seen by Phoe[facility] Perch ortho [facility]) last month, note reviewed. After being cleared by cards, GI, [facility], and PCP finally was recommended against surgery by ortho. Planned hip injection 02/14/2019. Has lost motivation due to high risk surgery and ortho hesitation. He does feel HH has been helpful for both mood and stamina.   To get hover round.  Had a fall this past weekend Saturday night, slipped while going to bathroom. Lay on the floor for 1.5 days. Had another 2 falls since Saturday night. Abrasions to knees and to R hip (rug burn). Also noticing some drainage from scrotal sores (h/o hidradenitis).       Relevant past medical, surgical, family and social history reviewed and updated as indicated. Interim medical history since our last visit reviewed. Allergies and medications reviewed and updated.  Outpatient Medications Prior to Visit  Medication Sig Dispense Refill  . albuterol (PROVENTIL HFA;VENTOLIN HFA) 108 (90 Base) MCG/ACT inhaler Inhale 2 puffs into the lungs every 6 (six) hours as needed for wheezing or shortness of breath. 1 Inhaler 0  . albuterol (PROVENTIL) (2.5 MG/3ML) 0.083% nebulizer solution USE 1 VIAL PER NEBULIZER EVERY 6 HRS AS NEEDED FOR WHEEZING 75 mL 6  . aspirin 81 MG tablet Take 1 tablet (81 mg total) by mouth daily.    . B Complex-C (SUPER B COMPLEX PO) Take 1 tablet by mouth daily.    . Ca Phosphate-Cholecalciferol [phone] MG-UNIT TABS Take by mouth.    . cholecalciferol 2000 units TABS Take 2,000 Units by mouth daily.    . Cobalamin Combinations (B-12) 1000-400 MCG SUBL Take by mouth.    . dicyclomine (BENTYL) 20 MG tablet TAKE 1 TABLET BY MOUTH 3 TIMES DAILY BEFORE MEALS. AS NEEDED FOR ABDOMINAL PAIN 30 tablet 0  . DULoxetine (CYMBALTA) 60 MG capsule Take 1 capsule (60 mg total) by mouth daily. 90 capsule 3  . fentaNYL (DURAGESIC - DOSED MCG/HR) 50 MCG/HR Place 1 patch (50 mcg total) every other day onto the skin. 15 patch 0  . fluticasone (FLONASE) 50 MCG/ACT nasal spray Place 2 sprays into both nostrils daily as needed for allergies.     . folic acid (FOLVITE) 1 MG tablet Take 1 tablet (1 mg total)  by mouth daily. 90 tablet 3  . lactulose (CEPHULAC) 20 g packet Take 1 packet (20 g total) by mouth 3 (three) times daily. 30 each 0  . loperamide (IMODIUM) 2 MG capsule Take 1 capsule (2 mg total) by mouth 4 (four) times daily as needed for diarrhea or loose stools. 12 capsule 0  . metoprolol succinate (TOPROL-XL) 25 MG 24 hr tablet Take 0.5 tablets (12.5 mg total) by mouth daily. 45 tablet 3  . Misc Natural Products (TART CHERRY ADVANCED) CAPS Take 1 capsule by mouth 2 (two) times daily.    . nitroGLYCERIN (NITROSTAT) 0.4 MG SL tablet Place 1 tablet (0.4 mg total) under the tongue every 5 (five) minutes as needed for chest pain. 25 tablet 12  . nystatin  (MYCOSTATIN/NYSTOP) powder Apply topically 2 (two) times daily. 60 g 11  . omeprazole (PRILOSEC) 40 MG capsule TAKE 1 CAPSULE BY MOUTH EVERY DAY 30 capsule 11  . ondansetron (ZOFRAN-ODT) 4 MG disintegrating tablet Take 1 tablet (4 mg total) by mouth every 8 (eight) hours as needed for nausea or vomiting. Day supply per insurance. 20 tablet 3  . oxyCODONE (ROXICODONE) 15 MG immediate release tablet Take 1 tablet (15 mg total) every 4 (four) hours as needed by mouth. 180 tablet 0  . polyethylene glycol powder (GLYCOLAX/MIRALAX) powder Take 17 g by mouth daily as needed for moderate constipation. 3350 g 0  . Red Yeast Rice 600 MG CAPS Take 2 capsules by mouth daily.    . vitamin B-12 (CYANOCOBALAMIN) 500 MCG tablet Take 1 tablet (500 mcg total) by mouth every Monday, Wednesday, and Friday.     No facility-administered medications prior to visit.      Per HPI unless specifically indicated in ROS section below Review of Systems Objective:    BP (!) 170/90 (BP Location: Right Arm, Patient Position: Sitting)   Pulse 80   Temp 98.3 F (36.8 C)   Ht 5' 10"  (1.778 m)   Wt 262 lb (118.8 kg)   SpO2 96%   BMI 37.59 kg/m   Wt Readings from Last 3 Encounters:  02/04/19 262 lb (118.8 kg)  12/26/18 257 lb (116.6 kg)  12/04/18 247 lb (112 kg)     Physical exam: Gen: alert, chronically ill appearing, pallor Pulm: speaks in complete sentences without increased work of breathing Psych: normal mood, normal thought content      Results for orders placed or performed in visit on 12/30/18  CBC and differential  Result Value Ref Range   Hemoglobin 10.8 (A) 13.5 - 17.5   Platelets 48 (A) 150 - 399   WBC 3.3   VITAMIN D 25 Hydroxy (Vit-D Deficiency, Fractures)  Result Value Ref Range   Vit D, 25-Hydroxy 28.3   Basic metabolic panel  Result Value Ref Range   Glucose 113    Creatinine 0.6 0.6 - 1.3   Potassium 3.8 3.4 - 5.3   Sodium 137 137 - 147  Lipid panel  Result Value Ref Range    Triglycerides 70 40 - 160   Cholesterol 127 0 - 200   HDL 42 35 - 70   LDL Cholesterol 71   Hepatic function panel  Result Value Ref Range   Alkaline Phosphatase 152 (A) 25 - 125   ALT 10 10 - 40   AST 19 14 - 40   Bilirubin, Total 0.9   Vitamin B12  Result Value Ref Range   Vitamin B-12 1,859   Hemoglobin A1c  Result Value  Ref Range   Hemoglobin A1C 4.8   TSH  Result Value Ref Range   TSH 1.41 0.41 - 5.90   Assessment & Plan:   Problem List Items Addressed This Visit    Severe obesity (BMI 35.0-39.9) with comorbidity ([place])   Recurrent falls   Protein-calorie malnutrition ([place])   Osteoarthritis of right hip    End stage, ortho feels surgery is too high risk. This is understandably very frustrating to patient. Support provided.       MDD (major depressive disorder), recurrent severe, without psychosis ([place])   Hidradenitis (Chronic)   General weakness   Fall with injury - Primary    Injured R hip - HHPT notes he is unable to bear weight on R leg. I have asked him to come into office tomorrow for further eval. He states he is getting hover round. I will also order hospital bed for home. Home wound care reviewed.       Relevant Orders   For home use only DME Hospital bed   Essential hypertension (Chronic)    BP elevated today, anticipate due to pain. Will reassess in office today.       Cirrhosis of liver without ascites (HCC)   Chronic pain syndrome   Charcot-Marie-Tooth disease       No orders of the defined types were placed in this encounter.  Orders Placed This Encounter  Procedures  . For home use only DME Hospital bed    Order Specific Question:   Length of Need    Answer:   Lifetime    Order Specific Question:   Patient has (list medical condition):    Answer:   charcot marie tooth, generalized weakness, periph neuropathy, hip arthritis, cervical and lumbar DDD    Order Specific Question:   The above medical condition requires:    Answer:   Patient  requires the ability to reposition frequently    Order Specific Question:   Bed type    Answer:   Semi-electric    Order Specific Question:   Harrel Lemon Lift    Answer:   Yes    Order Specific Question:   Support Surface:    Answer:   Gel Overlay    I discussed the assessment and treatment plan with the patient. The patient was provided an opportunity to ask questions and all were answered. The patient agreed with the plan and demonstrated an understanding of the instructions. The patient was advised to call back or seek an in-person evaluation if the symptoms worsen or if the condition fails to improve as anticipated.  Follow up plan: No follow-ups on file.  Ria Bush, MD

## 2019-02-04 NOTE — Telephone Encounter (Signed)
Pt seen today

## 2019-02-04 NOTE — Assessment & Plan Note (Signed)
End stage, ortho feels surgery is too high risk. This is understandably very frustrating to patient. Support provided.

## 2019-02-04 NOTE — Assessment & Plan Note (Signed)
BP elevated today, anticipate due to pain. Will reassess in office today.

## 2019-02-04 NOTE — Telephone Encounter (Signed)
Erline Levine of [facility] is requesting verbal orders to see pt again this week and twice a week for the next few weeks due to pt falling.  Pt has abrasions to bilateral knees and bruise on right hip.  Erline Levine can be reached at [phone].

## 2019-02-04 NOTE — Assessment & Plan Note (Addendum)
Injured R hip - HHPT notes he is unable to bear weight on R leg. I have asked him to come into office tomorrow for further eval. He states he is getting hover round. I will also order hospital bed for home. Home wound care reviewed.

## 2019-02-05 ENCOUNTER — Ambulatory Visit (INDEPENDENT_AMBULATORY_CARE_PROVIDER_SITE_OTHER): Payer: Medicare PPO | Admitting: Family Medicine

## 2019-02-05 ENCOUNTER — Encounter: Payer: Self-pay | Admitting: Family Medicine

## 2019-02-05 ENCOUNTER — Ambulatory Visit (INDEPENDENT_AMBULATORY_CARE_PROVIDER_SITE_OTHER)
Admission: RE | Admit: 2019-02-05 | Discharge: 2019-02-05 | Disposition: A | Discharge status: Home / Self Care | Payer: Medicare PPO | Source: Ambulatory Visit | Attending: Family Medicine | Admitting: Family Medicine

## 2019-02-05 ENCOUNTER — Other Ambulatory Visit: Payer: Self-pay

## 2019-02-05 VITALS — BP 134/70 | HR 96 | Temp 97.8°F | Ht 70.0 in

## 2019-02-05 DIAGNOSIS — M25551 Pain in right hip: Secondary | ICD-10-CM

## 2019-02-05 DIAGNOSIS — K7460 Unspecified cirrhosis of liver: Secondary | ICD-10-CM

## 2019-02-05 DIAGNOSIS — R531 Weakness: Secondary | ICD-10-CM

## 2019-02-05 DIAGNOSIS — M1611 Unilateral primary osteoarthritis, right hip: Secondary | ICD-10-CM

## 2019-02-05 DIAGNOSIS — W19XXXD Unspecified fall, subsequent encounter: Secondary | ICD-10-CM

## 2019-02-05 DIAGNOSIS — S79911A Unspecified injury of right hip, initial encounter: Secondary | ICD-10-CM | POA: Diagnosis not present

## 2019-02-05 DIAGNOSIS — M25561 Pain in right knee: Secondary | ICD-10-CM

## 2019-02-05 DIAGNOSIS — L732 Hidradenitis suppurativa: Secondary | ICD-10-CM | POA: Diagnosis not present

## 2019-02-05 DIAGNOSIS — B372 Candidiasis of skin and nail: Secondary | ICD-10-CM | POA: Diagnosis not present

## 2019-02-05 DIAGNOSIS — F17200 Nicotine dependence, unspecified, uncomplicated: Secondary | ICD-10-CM

## 2019-02-05 DIAGNOSIS — G894 Chronic pain syndrome: Secondary | ICD-10-CM

## 2019-02-05 DIAGNOSIS — F332 Major depressive disorder, recurrent severe without psychotic features: Secondary | ICD-10-CM | POA: Diagnosis not present

## 2019-02-05 DIAGNOSIS — R296 Repeated falls: Secondary | ICD-10-CM | POA: Diagnosis not present

## 2019-02-05 MED ORDER — NYSTATIN 100000 UNIT/GM EX CREA: 1.0000 "application " | TOPICAL_CREAM | Freq: Two times a day (BID) | CUTANEOUS | 0 refills | Status: DC

## 2019-02-05 MED ORDER — METHOCARBAMOL 500 MG PO TABS: 500.0000 mg | ORAL_TABLET | Freq: Every day | ORAL | 0 refills | Status: DC

## 2019-02-05 MED ORDER — ONDANSETRON 4 MG PO TBDP: 4.0000 mg | ORAL_TABLET | Freq: Three times a day (TID) | ORAL | 3 refills | Status: DC | PRN

## 2019-02-05 NOTE — Patient Instructions (Addendum)
Nystatin cream refilled xrays today Continue daily dressing changes to wounds.

## 2019-02-05 NOTE — Progress Notes (Addendum)
This visit was conducted in person.  BP 134/70 (BP Location: Left Arm, Patient Position: Sitting, Cuff Size: Large)    Pulse 96    Temp 97.8 F (36.6 C) (Tympanic)    Ht 5' 10"  (1.778 m)    SpO2 96%    BMI 37.59 kg/m    CC: L hip pain Subjective:    Patient ID: [name], male    DOB: 03-Nov-1955, 63 y.o.   MRN: [id number]  HPI: [name] is a 63 y.o. male presenting on 02/05/2019 for Hip Pain (C/o right pain after a couple of falls out ouf bed 3-4 days ago.  Pt accompanied by wife, [name]. ) and Form Completion (Needs [place]  [employer] Disability parking placard form completed. )   Here with wife today.   See prior note for details from prior visit: Had a fall this past weekend Saturday night, slipped while going to bathroom. Lay on the floor for 1.5 days. Had another 2 falls since Saturday night. Abrasions to knees and to R hip (rug burn). Also noticing some drainage from scrotal sores (h/o hidradenitis).      Requests handicap placard filled out - this was done.  Wife is treating wounds with peroxide then neosporin.  Scrotal hydradenitis returning, draining sores.  Has continued to have may falls at home.  Noticing more trouble breathing.  Has restarted smoking.  Worsening mood despite cymbalta 71m daily.    Relevant past medical, surgical, family and social history reviewed and updated as indicated. Interim medical history since our last visit reviewed. Allergies and medications reviewed and updated. Outpatient Medications Prior to Visit  Medication Sig Dispense Refill   albuterol (PROVENTIL HFA;VENTOLIN HFA) 108 (90 Base) MCG/ACT inhaler Inhale 2 puffs into the lungs every 6 (six) hours as needed for wheezing or shortness of breath. 1 Inhaler 0   albuterol (PROVENTIL) (2.5 MG/3ML) 0.083% nebulizer solution USE 1 VIAL PER NEBULIZER EVERY 6 HRS AS NEEDED FOR WHEEZING 75 mL 6   aspirin 81 MG tablet Take 1 tablet (81 mg total) by mouth daily.     B Complex-C (SUPER  B COMPLEX PO) Take 1 tablet by mouth daily.     Ca Phosphate-Cholecalciferol [phone] MG-UNIT TABS Take by mouth.     cholecalciferol 2000 units TABS Take 2,000 Units by mouth daily.     Cobalamin Combinations (B-12) 1000-400 MCG SUBL Take by mouth.     dicyclomine (BENTYL) 20 MG tablet TAKE 1 TABLET BY MOUTH 3 TIMES DAILY BEFORE MEALS. AS NEEDED FOR ABDOMINAL PAIN 30 tablet 0   DULoxetine (CYMBALTA) 60 MG capsule Take 1 capsule (60 mg total) by mouth daily. 90 capsule 3   fentaNYL (DURAGESIC - DOSED MCG/HR) 50 MCG/HR Place 1 patch (50 mcg total) every other day onto the skin. 15 patch 0   fluticasone (FLONASE) 50 MCG/ACT nasal spray Place 2 sprays into both nostrils daily as needed for allergies.      folic acid (FOLVITE) 1 MG tablet Take 1 tablet (1 mg total) by mouth daily. 90 tablet 3   lactulose (CEPHULAC) 20 g packet Take 1 packet (20 g total) by mouth 3 (three) times daily. 30 each 0   loperamide (IMODIUM) 2 MG capsule Take 1 capsule (2 mg total) by mouth 4 (four) times daily as needed for diarrhea or loose stools. 12 capsule 0   metoprolol succinate (TOPROL-XL) 25 MG 24 hr tablet Take 0.5 tablets (12.5 mg total) by mouth daily. 45 tablet 3  Misc Natural Products (TART CHERRY ADVANCED) CAPS Take 1 capsule by mouth 2 (two) times daily.     nitroGLYCERIN (NITROSTAT) 0.4 MG SL tablet Place 1 tablet (0.4 mg total) under the tongue every 5 (five) minutes as needed for chest pain. 25 tablet 12   nystatin (MYCOSTATIN/NYSTOP) powder Apply topically 2 (two) times daily. 60 g 11   omeprazole (PRILOSEC) 40 MG capsule TAKE 1 CAPSULE BY MOUTH EVERY DAY 30 capsule 11   oxyCODONE (ROXICODONE) 15 MG immediate release tablet Take 1 tablet (15 mg total) every 4 (four) hours as needed by mouth. 180 tablet 0   polyethylene glycol powder (GLYCOLAX/MIRALAX) powder Take 17 g by mouth daily as needed for moderate constipation. 3350 g 0   Red Yeast Rice 600 MG CAPS Take 2 capsules by mouth daily.      vitamin B-12 (CYANOCOBALAMIN) 500 MCG tablet Take 1 tablet (500 mcg total) by mouth every Monday, Wednesday, and Friday.     ondansetron (ZOFRAN-ODT) 4 MG disintegrating tablet Take 1 tablet (4 mg total) by mouth every 8 (eight) hours as needed for nausea or vomiting. Day supply per insurance. 20 tablet 3   No facility-administered medications prior to visit.      Per HPI unless specifically indicated in ROS section below Review of Systems Objective:    BP 134/70 (BP Location: Left Arm, Patient Position: Sitting, Cuff Size: Large)    Pulse 96    Temp 97.8 F (36.6 C) (Tympanic)    Ht 5' 10"  (1.778 m)    SpO2 96%    BMI 37.59 kg/m   Wt Readings from Last 3 Encounters:  02/04/19 262 lb (118.8 kg)  12/26/18 257 lb (116.6 kg)  12/04/18 247 lb (112 kg)    Physical Exam Vitals signs and nursing note reviewed.  Constitutional:      General: He is not in acute distress.    Appearance: Normal appearance. He is obese. He is ill-appearing.     Comments: In wheelchair  HENT:     Mouth/Throat:     Mouth: Mucous membranes are moist.     Pharynx: No posterior oropharyngeal erythema.  Eyes:     Extraocular Movements: Extraocular movements intact.     Pupils: Pupils are equal, round, and reactive to light.  Cardiovascular:     Rate and Rhythm: Normal rate and regular rhythm.     Pulses: Normal pulses.     Heart sounds: Normal heart sounds. No murmur.  Musculoskeletal:     Right lower leg: No edema.     Left lower leg: No edema.  Skin:    Coloration: Skin is pale.     Findings: Abrasion and rash present.          Comments:  Small abrasions bilateral anterior knees Rug-burn abrasion to R posterior thigh into buttock Erythematous raw skin to R groin  Neurological:     Mental Status: He is alert.       Results for orders placed or performed in visit on 12/30/18  CBC and differential  Result Value Ref Range   Hemoglobin 10.8 (A) 13.5 - 17.5   Platelets 48 (A) 150 - 399   WBC  3.3   VITAMIN D 25 Hydroxy (Vit-D Deficiency, Fractures)  Result Value Ref Range   Vit D, 25-Hydroxy 91.6   Basic metabolic panel  Result Value Ref Range   Glucose 113    Creatinine 0.6 0.6 - 1.3   Potassium 3.8 3.4 - 5.3   Sodium  137 137 - 147  Lipid panel  Result Value Ref Range   Triglycerides 70 40 - 160   Cholesterol 127 0 - 200   HDL 42 35 - 70   LDL Cholesterol 71   Hepatic function panel  Result Value Ref Range   Alkaline Phosphatase 152 (A) 25 - 125   ALT 10 10 - 40   AST 19 14 - 40   Bilirubin, Total 0.9   Vitamin B12  Result Value Ref Range   Vitamin B-12 1,859   Hemoglobin A1c  Result Value Ref Range   Hemoglobin A1C 4.8   TSH  Result Value Ref Range   TSH 1.41 0.41 - 5.90   DG Hip Unilat W OR W/O Pelvis 2-3 Views Right CLINICAL DATA:  Right hip pain after fall.  EXAM: DG HIP (WITH OR WITHOUT PELVIS) 2-3V RIGHT  COMPARISON:  Radiographs of August 12, 2012.  FINDINGS: There is no evidence of hip fracture or dislocation. Severe narrowing and osteophyte formation is seen involving the right hip joint.  IMPRESSION: Severe degenerative joint disease of the right hip. No acute abnormality seen.  Electronically Signed   By: Marijo Conception M.D.   On: 02/05/2019 16:54 DG Knee Complete 4 Views Right CLINICAL DATA:  Right knee pain after fall  EXAM: RIGHT KNEE - COMPLETE 4+ VIEW  COMPARISON:  None.  FINDINGS: Examination is slightly limited as no lateral view was able to be performed secondary to patient condition.  No acute fracture or malalignment is evident. Moderate lateral compartment joint space narrowing. Bones appear demineralized. Mild diffuse soft tissue swelling.  IMPRESSION: No acute osseous abnormality, right knee.  Electronically Signed   By: Davina Poke M.D.   On: 02/05/2019 16:50   Assessment & Plan:  Hospital bed order re-entered.  Due to hip osteoarthritis and hydradenitis, patient requires frequent changes in body  position.  The patient has a medical condition which requires positioning of the body in ways not feasible with an ordinary bed and/or the patient requires positioning of the body in ways not feasible with an ordinary bed in order to alleviate pain and/or the patient requires traction equipment which can only be attached to a hospital bed.  A gel overlay is required due to: limited mobility AND:  Impaired nutrition status   Altered sensory perception   Problem List Items Addressed This Visit    Smoker    Has restarted smoking.       Severe obesity (BMI 35.0-39.9) with comorbidity ([place])   Right hip pain    Known end stage R hip osteoarthritis, with acute worsening after fall. Check films r/o fracture. He recently had steroid injection into hip.       Relevant Orders   DG Hip Unilat W OR W/O Pelvis 2-3 Views Right (Completed)   Recurrent falls   Relevant Orders   DG Knee Complete 4 Views Right (Completed)   DG Hip Unilat W OR W/O Pelvis 2-3 Views Right (Completed)   Osteoarthritis of right hip   Relevant Medications   methocarbamol (ROBAXIN) 500 MG tablet   MDD (major depressive disorder), recurrent severe, without psychosis ([place])    Deteriorated given recent ortho rec against proceeding with surgery. HH involvement helps with mood. Will trial robaxin at night as muscle relaxant, if ineffective discussed possible trazodone addition to help sleep.       Hidradenitis (Chronic)   General weakness   Fall with injury - Primary    Recurrent falls  at home.  HH PT involved.  Check films today of R hip and knee r/o fracture.       Cirrhosis of liver without ascites (HCC)   Chronic pain syndrome   Relevant Medications   methocarbamol (ROBAXIN) 500 MG tablet   Candidal intertrigo    Add nystatin cream to powder regimen.       Relevant Medications   nystatin cream (MYCOSTATIN)   Acute pain of right knee    R anterior knee pain after falls suffered over weekend, Passive ROM  preserved. Check films       Relevant Orders   DG Knee Complete 4 Views Right (Completed)       Meds ordered this encounter  Medications   ondansetron (ZOFRAN-ODT) 4 MG disintegrating tablet    Sig: Take 1 tablet (4 mg total) by mouth every 8 (eight) hours as needed for nausea or vomiting. Day supply per insurance.    Dispense:  20 tablet    Refill:  3   nystatin cream (MYCOSTATIN)    Sig: Apply 1 application topically 2 (two) times daily.    Dispense:  80 g    Refill:  0   methocarbamol (ROBAXIN) 500 MG tablet    Sig: Take 1 tablet (500 mg total) by mouth at bedtime.    Dispense:  30 tablet    Refill:  0   Orders Placed This Encounter  Procedures   DG Knee Complete 4 Views Right    Standing Status:   Future    Number of Occurrences:   1    Standing Expiration Date:   04/06/2020    Order Specific Question:   Reason for Exam (SYMPTOM  OR DIAGNOSIS REQUIRED)    Answer:   R knee pain after fall    Order Specific Question:   Preferred imaging location?    Answer:   [facility]    Order Specific Question:   Radiology Contrast Protocol - do NOT remove file path    Answer:   \[email or web address]   DG Hip Unilat W OR W/O Pelvis 2-3 Views Right    Standing Status:   Future    Number of Occurrences:   1    Standing Expiration Date:   04/06/2020    Order Specific Question:   Reason for Exam (SYMPTOM  OR DIAGNOSIS REQUIRED)    Answer:   R hip pain after fall    Order Specific Question:   Preferred imaging location?    Answer:   [facility]    Order Specific Question:   Radiology Contrast Protocol - do NOT remove file path    Answer:   \[email or web address]    Patient Instructions  Nystatin cream refilled xrays today Continue daily dressing changes to wounds.    Follow up plan: No follow-ups on file.  Ria Bush, MD

## 2019-02-06 ENCOUNTER — Telehealth: Payer: Self-pay | Admitting: Family Medicine

## 2019-02-06 DIAGNOSIS — M25561 Pain in right knee: Secondary | ICD-10-CM | POA: Insufficient documentation

## 2019-02-06 NOTE — Assessment & Plan Note (Addendum)
Known end stage R hip osteoarthritis, with acute worsening after fall. Check films r/o fracture. He recently had steroid injection into hip.

## 2019-02-06 NOTE — Assessment & Plan Note (Addendum)
R anterior knee pain after falls suffered over weekend, Passive ROM preserved. Check films

## 2019-02-06 NOTE — Assessment & Plan Note (Signed)
Deteriorated given recent ortho rec against proceeding with surgery. HH involvement helps with mood. Will trial robaxin at night as muscle relaxant, if ineffective discussed possible trazodone addition to help sleep.

## 2019-02-06 NOTE — Assessment & Plan Note (Signed)
Recurrent falls at home.  HH PT involved.  Check films today of R hip and knee r/o fracture.

## 2019-02-06 NOTE — Telephone Encounter (Signed)
See Imaging Result Notes, 02/05/19.

## 2019-02-06 NOTE — Telephone Encounter (Signed)
Pt is calling regarding results from X-Ray.   CB [phone]

## 2019-02-06 NOTE — Assessment & Plan Note (Signed)
Add nystatin cream to powder regimen.

## 2019-02-06 NOTE — Assessment & Plan Note (Signed)
Has restarted smoking.

## 2019-02-07 DIAGNOSIS — Z5181 Encounter for therapeutic drug level monitoring: Secondary | ICD-10-CM | POA: Diagnosis not present

## 2019-02-07 DIAGNOSIS — Z7982 Long term (current) use of aspirin: Secondary | ICD-10-CM | POA: Diagnosis not present

## 2019-02-07 DIAGNOSIS — M1711 Unilateral primary osteoarthritis, right knee: Secondary | ICD-10-CM | POA: Diagnosis not present

## 2019-02-07 DIAGNOSIS — Z9114 Patient's other noncompliance with medication regimen: Secondary | ICD-10-CM | POA: Diagnosis not present

## 2019-02-07 DIAGNOSIS — R296 Repeated falls: Secondary | ICD-10-CM | POA: Diagnosis not present

## 2019-02-07 DIAGNOSIS — Z87891 Personal history of nicotine dependence: Secondary | ICD-10-CM | POA: Diagnosis not present

## 2019-02-07 DIAGNOSIS — Z87440 Personal history of urinary (tract) infections: Secondary | ICD-10-CM | POA: Diagnosis not present

## 2019-02-07 DIAGNOSIS — Z6838 Body mass index (BMI) 38.0-38.9, adult: Secondary | ICD-10-CM | POA: Diagnosis not present

## 2019-02-07 DIAGNOSIS — G600 Hereditary motor and sensory neuropathy: Secondary | ICD-10-CM | POA: Diagnosis not present

## 2019-02-11 DIAGNOSIS — G600 Hereditary motor and sensory neuropathy: Secondary | ICD-10-CM | POA: Diagnosis not present

## 2019-02-11 DIAGNOSIS — Z87440 Personal history of urinary (tract) infections: Secondary | ICD-10-CM | POA: Diagnosis not present

## 2019-02-11 DIAGNOSIS — Z6838 Body mass index (BMI) 38.0-38.9, adult: Secondary | ICD-10-CM | POA: Diagnosis not present

## 2019-02-11 DIAGNOSIS — H9190 Unspecified hearing loss, unspecified ear: Secondary | ICD-10-CM | POA: Diagnosis not present

## 2019-02-11 DIAGNOSIS — K7460 Unspecified cirrhosis of liver: Secondary | ICD-10-CM | POA: Diagnosis not present

## 2019-02-11 DIAGNOSIS — Z87891 Personal history of nicotine dependence: Secondary | ICD-10-CM | POA: Diagnosis not present

## 2019-02-11 DIAGNOSIS — R296 Repeated falls: Secondary | ICD-10-CM | POA: Diagnosis not present

## 2019-02-11 DIAGNOSIS — M1611 Unilateral primary osteoarthritis, right hip: Secondary | ICD-10-CM | POA: Diagnosis not present

## 2019-02-11 DIAGNOSIS — Z9181 History of falling: Secondary | ICD-10-CM | POA: Diagnosis not present

## 2019-02-12 DIAGNOSIS — R069 Unspecified abnormalities of breathing: Secondary | ICD-10-CM | POA: Diagnosis not present

## 2019-02-12 DIAGNOSIS — M1611 Unilateral primary osteoarthritis, right hip: Secondary | ICD-10-CM | POA: Diagnosis not present

## 2019-02-12 DIAGNOSIS — G600 Hereditary motor and sensory neuropathy: Secondary | ICD-10-CM | POA: Diagnosis not present

## 2019-02-12 DIAGNOSIS — R296 Repeated falls: Secondary | ICD-10-CM | POA: Diagnosis not present

## 2019-02-13 DIAGNOSIS — Z87891 Personal history of nicotine dependence: Secondary | ICD-10-CM | POA: Diagnosis not present

## 2019-02-13 DIAGNOSIS — Z87440 Personal history of urinary (tract) infections: Secondary | ICD-10-CM | POA: Diagnosis not present

## 2019-02-13 DIAGNOSIS — M1611 Unilateral primary osteoarthritis, right hip: Secondary | ICD-10-CM | POA: Diagnosis not present

## 2019-02-13 DIAGNOSIS — H9190 Unspecified hearing loss, unspecified ear: Secondary | ICD-10-CM | POA: Diagnosis not present

## 2019-02-13 DIAGNOSIS — Z9181 History of falling: Secondary | ICD-10-CM | POA: Diagnosis not present

## 2019-02-13 DIAGNOSIS — R296 Repeated falls: Secondary | ICD-10-CM | POA: Diagnosis not present

## 2019-02-13 DIAGNOSIS — K7460 Unspecified cirrhosis of liver: Secondary | ICD-10-CM | POA: Diagnosis not present

## 2019-02-13 DIAGNOSIS — Z6838 Body mass index (BMI) 38.0-38.9, adult: Secondary | ICD-10-CM | POA: Diagnosis not present

## 2019-02-13 DIAGNOSIS — G600 Hereditary motor and sensory neuropathy: Secondary | ICD-10-CM | POA: Diagnosis not present

## 2019-02-14 DIAGNOSIS — M1611 Unilateral primary osteoarthritis, right hip: Secondary | ICD-10-CM | POA: Diagnosis not present

## 2019-02-14 DIAGNOSIS — M25551 Pain in right hip: Secondary | ICD-10-CM | POA: Diagnosis not present

## 2019-02-17 DIAGNOSIS — Z6838 Body mass index (BMI) 38.0-38.9, adult: Secondary | ICD-10-CM | POA: Diagnosis not present

## 2019-02-17 DIAGNOSIS — Z87891 Personal history of nicotine dependence: Secondary | ICD-10-CM | POA: Diagnosis not present

## 2019-02-17 DIAGNOSIS — R296 Repeated falls: Secondary | ICD-10-CM | POA: Diagnosis not present

## 2019-02-17 DIAGNOSIS — Z9181 History of falling: Secondary | ICD-10-CM | POA: Diagnosis not present

## 2019-02-17 DIAGNOSIS — Z87440 Personal history of urinary (tract) infections: Secondary | ICD-10-CM | POA: Diagnosis not present

## 2019-02-17 DIAGNOSIS — M1611 Unilateral primary osteoarthritis, right hip: Secondary | ICD-10-CM | POA: Diagnosis not present

## 2019-02-17 DIAGNOSIS — G600 Hereditary motor and sensory neuropathy: Secondary | ICD-10-CM | POA: Diagnosis not present

## 2019-02-17 DIAGNOSIS — K7460 Unspecified cirrhosis of liver: Secondary | ICD-10-CM | POA: Diagnosis not present

## 2019-02-17 DIAGNOSIS — H9190 Unspecified hearing loss, unspecified ear: Secondary | ICD-10-CM | POA: Diagnosis not present

## 2019-02-19 DIAGNOSIS — Z87440 Personal history of urinary (tract) infections: Secondary | ICD-10-CM | POA: Diagnosis not present

## 2019-02-19 DIAGNOSIS — Z6838 Body mass index (BMI) 38.0-38.9, adult: Secondary | ICD-10-CM | POA: Diagnosis not present

## 2019-02-19 DIAGNOSIS — Z9181 History of falling: Secondary | ICD-10-CM | POA: Diagnosis not present

## 2019-02-19 DIAGNOSIS — H9190 Unspecified hearing loss, unspecified ear: Secondary | ICD-10-CM | POA: Diagnosis not present

## 2019-02-19 DIAGNOSIS — M1611 Unilateral primary osteoarthritis, right hip: Secondary | ICD-10-CM | POA: Diagnosis not present

## 2019-02-19 DIAGNOSIS — G600 Hereditary motor and sensory neuropathy: Secondary | ICD-10-CM | POA: Diagnosis not present

## 2019-02-19 DIAGNOSIS — Z87891 Personal history of nicotine dependence: Secondary | ICD-10-CM | POA: Diagnosis not present

## 2019-02-19 DIAGNOSIS — K7460 Unspecified cirrhosis of liver: Secondary | ICD-10-CM | POA: Diagnosis not present

## 2019-02-19 DIAGNOSIS — R296 Repeated falls: Secondary | ICD-10-CM | POA: Diagnosis not present

## 2019-02-20 DIAGNOSIS — Z79891 Long term (current) use of opiate analgesic: Secondary | ICD-10-CM | POA: Diagnosis not present

## 2019-02-20 DIAGNOSIS — G894 Chronic pain syndrome: Secondary | ICD-10-CM | POA: Diagnosis not present

## 2019-02-20 DIAGNOSIS — M542 Cervicalgia: Secondary | ICD-10-CM | POA: Diagnosis not present

## 2019-02-20 DIAGNOSIS — M545 Low back pain: Secondary | ICD-10-CM | POA: Diagnosis not present

## 2019-02-25 DIAGNOSIS — R296 Repeated falls: Secondary | ICD-10-CM | POA: Diagnosis not present

## 2019-02-25 DIAGNOSIS — G600 Hereditary motor and sensory neuropathy: Secondary | ICD-10-CM | POA: Diagnosis not present

## 2019-02-25 DIAGNOSIS — Z6838 Body mass index (BMI) 38.0-38.9, adult: Secondary | ICD-10-CM | POA: Diagnosis not present

## 2019-02-25 DIAGNOSIS — K7460 Unspecified cirrhosis of liver: Secondary | ICD-10-CM | POA: Diagnosis not present

## 2019-02-25 DIAGNOSIS — H9190 Unspecified hearing loss, unspecified ear: Secondary | ICD-10-CM | POA: Diagnosis not present

## 2019-02-25 DIAGNOSIS — Z87891 Personal history of nicotine dependence: Secondary | ICD-10-CM | POA: Diagnosis not present

## 2019-02-25 DIAGNOSIS — Z9181 History of falling: Secondary | ICD-10-CM | POA: Diagnosis not present

## 2019-02-25 DIAGNOSIS — Z87440 Personal history of urinary (tract) infections: Secondary | ICD-10-CM | POA: Diagnosis not present

## 2019-02-25 DIAGNOSIS — M1611 Unilateral primary osteoarthritis, right hip: Secondary | ICD-10-CM | POA: Diagnosis not present

## 2019-02-27 ENCOUNTER — Other Ambulatory Visit: Payer: Self-pay | Admitting: Family Medicine

## 2019-02-27 DIAGNOSIS — G600 Hereditary motor and sensory neuropathy: Secondary | ICD-10-CM | POA: Diagnosis not present

## 2019-02-27 DIAGNOSIS — M1611 Unilateral primary osteoarthritis, right hip: Secondary | ICD-10-CM | POA: Diagnosis not present

## 2019-02-27 DIAGNOSIS — Z6838 Body mass index (BMI) 38.0-38.9, adult: Secondary | ICD-10-CM | POA: Diagnosis not present

## 2019-02-27 DIAGNOSIS — K7460 Unspecified cirrhosis of liver: Secondary | ICD-10-CM | POA: Diagnosis not present

## 2019-02-27 DIAGNOSIS — Z87891 Personal history of nicotine dependence: Secondary | ICD-10-CM | POA: Diagnosis not present

## 2019-02-27 DIAGNOSIS — H9190 Unspecified hearing loss, unspecified ear: Secondary | ICD-10-CM | POA: Diagnosis not present

## 2019-02-27 DIAGNOSIS — Z87440 Personal history of urinary (tract) infections: Secondary | ICD-10-CM | POA: Diagnosis not present

## 2019-02-27 DIAGNOSIS — Z9181 History of falling: Secondary | ICD-10-CM | POA: Diagnosis not present

## 2019-02-27 DIAGNOSIS — R296 Repeated falls: Secondary | ICD-10-CM | POA: Diagnosis not present

## 2019-02-27 MED ORDER — OXYCODONE HCL 15 MG PO TABS: 15.0000 mg | ORAL_TABLET | ORAL | 0 refills | Status: DC | PRN

## 2019-02-27 NOTE — Telephone Encounter (Signed)
Best number [phone]  [name] (spouse) called stating pt goes to [facility] in [place].  She has been trying to get a hold of them since Friday of last week and cannot reach them.  Nobody is returning her call the drugstore has been trying to get a hold of them also with no response.  Spouse drive up there yesterday and office was closed.  Pt has been out of his meds since sat.  Pt is in  bed and now crying because of pain and withdrawal.    Pt takes  Oxycodone 68m 6 a day She wanted to know if you could call in something for pt  [facility] [place]

## 2019-02-27 NOTE — Telephone Encounter (Signed)
Due to extraordinary circumstances, I will prescribe 2 wks worth of his oxycodone - have them continue trying to get in touch with pain clinic to continue refill after 2 weeks.  plz forward this phone note attn Dr Sanjuan Dame.  [place] CSRS reviewed.

## 2019-02-27 NOTE — Telephone Encounter (Signed)
Robin transferred Mrs Shreve to me; I called Dr Sherryle Lis Dogra's office [phone] and v/m was full. Mrs Douthit wants to know if Dr Danise Mina could prescribe the oxycodone for pt. She is not asking for the fentanyl. Mrs Netto request cb..  I called [facility] and spoke with Marjory Lies who said pt last got oxycodone 15 mg # 180 on 01/27/19 which is a 30 day supply. Marjory Lies said pt should be out of med today; pt is to take 6 tabs daily. Marjory Lies did say several of Dr Jaci Lazier pts have complained that since covid started the office has been closed and they cannot get in touch with anyone at the office. Please advise.

## 2019-02-28 NOTE — Telephone Encounter (Signed)
Left detailed message with information listed below on voicemail. OK per DPR. Faxing this note to Dr Sanjuan Dame.

## 2019-03-03 DIAGNOSIS — R069 Unspecified abnormalities of breathing: Secondary | ICD-10-CM | POA: Diagnosis not present

## 2019-03-03 DIAGNOSIS — G600 Hereditary motor and sensory neuropathy: Secondary | ICD-10-CM | POA: Diagnosis not present

## 2019-03-03 DIAGNOSIS — R296 Repeated falls: Secondary | ICD-10-CM | POA: Diagnosis not present

## 2019-03-03 DIAGNOSIS — M1611 Unilateral primary osteoarthritis, right hip: Secondary | ICD-10-CM | POA: Diagnosis not present

## 2019-03-04 DIAGNOSIS — Z87891 Personal history of nicotine dependence: Secondary | ICD-10-CM | POA: Diagnosis not present

## 2019-03-04 DIAGNOSIS — Z87440 Personal history of urinary (tract) infections: Secondary | ICD-10-CM | POA: Diagnosis not present

## 2019-03-04 DIAGNOSIS — Z6838 Body mass index (BMI) 38.0-38.9, adult: Secondary | ICD-10-CM | POA: Diagnosis not present

## 2019-03-04 DIAGNOSIS — Z9181 History of falling: Secondary | ICD-10-CM | POA: Diagnosis not present

## 2019-03-04 DIAGNOSIS — M1611 Unilateral primary osteoarthritis, right hip: Secondary | ICD-10-CM | POA: Diagnosis not present

## 2019-03-04 DIAGNOSIS — K7460 Unspecified cirrhosis of liver: Secondary | ICD-10-CM | POA: Diagnosis not present

## 2019-03-04 DIAGNOSIS — R296 Repeated falls: Secondary | ICD-10-CM | POA: Diagnosis not present

## 2019-03-04 DIAGNOSIS — H9190 Unspecified hearing loss, unspecified ear: Secondary | ICD-10-CM | POA: Diagnosis not present

## 2019-03-04 DIAGNOSIS — G600 Hereditary motor and sensory neuropathy: Secondary | ICD-10-CM | POA: Diagnosis not present

## 2019-03-05 ENCOUNTER — Telehealth: Payer: Self-pay | Admitting: *Deleted

## 2019-03-05 DIAGNOSIS — Z87891 Personal history of nicotine dependence: Secondary | ICD-10-CM | POA: Diagnosis not present

## 2019-03-05 DIAGNOSIS — Z7982 Long term (current) use of aspirin: Secondary | ICD-10-CM

## 2019-03-05 DIAGNOSIS — Z122 Encounter for screening for malignant neoplasm of respiratory organs: Secondary | ICD-10-CM

## 2019-03-05 DIAGNOSIS — H9190 Unspecified hearing loss, unspecified ear: Secondary | ICD-10-CM | POA: Diagnosis not present

## 2019-03-05 DIAGNOSIS — G600 Hereditary motor and sensory neuropathy: Secondary | ICD-10-CM | POA: Diagnosis not present

## 2019-03-05 DIAGNOSIS — R296 Repeated falls: Secondary | ICD-10-CM | POA: Diagnosis not present

## 2019-03-05 DIAGNOSIS — Z79891 Long term (current) use of opiate analgesic: Secondary | ICD-10-CM

## 2019-03-05 DIAGNOSIS — Z9181 History of falling: Secondary | ICD-10-CM | POA: Diagnosis not present

## 2019-03-05 DIAGNOSIS — Z87440 Personal history of urinary (tract) infections: Secondary | ICD-10-CM | POA: Diagnosis not present

## 2019-03-05 DIAGNOSIS — M1611 Unilateral primary osteoarthritis, right hip: Secondary | ICD-10-CM | POA: Diagnosis not present

## 2019-03-05 DIAGNOSIS — Z7951 Long term (current) use of inhaled steroids: Secondary | ICD-10-CM

## 2019-03-05 DIAGNOSIS — Z6838 Body mass index (BMI) 38.0-38.9, adult: Secondary | ICD-10-CM | POA: Diagnosis not present

## 2019-03-05 DIAGNOSIS — K7460 Unspecified cirrhosis of liver: Secondary | ICD-10-CM | POA: Diagnosis not present

## 2019-03-05 NOTE — Telephone Encounter (Signed)
Patient has been notified that annual lung cancer screening low dose CT scan is due currently or will be in near future. Confirmed that patient is within the age range of 55-77, and asymptomatic, (no signs or symptoms of lung cancer). Patient denies illness that would prevent curative treatment for lung cancer if found. Verified smoking history, (current, 58.5 pack year). The shared decision making visit was done 12/28/17. Patient is agreeable for CT scan being scheduled.

## 2019-03-06 ENCOUNTER — Telehealth: Payer: Self-pay | Admitting: Family Medicine

## 2019-03-06 DIAGNOSIS — Z6838 Body mass index (BMI) 38.0-38.9, adult: Secondary | ICD-10-CM | POA: Diagnosis not present

## 2019-03-06 DIAGNOSIS — Z87891 Personal history of nicotine dependence: Secondary | ICD-10-CM | POA: Diagnosis not present

## 2019-03-06 DIAGNOSIS — G600 Hereditary motor and sensory neuropathy: Secondary | ICD-10-CM | POA: Diagnosis not present

## 2019-03-06 DIAGNOSIS — Z87440 Personal history of urinary (tract) infections: Secondary | ICD-10-CM | POA: Diagnosis not present

## 2019-03-06 DIAGNOSIS — Z9181 History of falling: Secondary | ICD-10-CM | POA: Diagnosis not present

## 2019-03-06 DIAGNOSIS — R296 Repeated falls: Secondary | ICD-10-CM | POA: Diagnosis not present

## 2019-03-06 DIAGNOSIS — K7460 Unspecified cirrhosis of liver: Secondary | ICD-10-CM | POA: Diagnosis not present

## 2019-03-06 DIAGNOSIS — H9190 Unspecified hearing loss, unspecified ear: Secondary | ICD-10-CM | POA: Diagnosis not present

## 2019-03-06 DIAGNOSIS — M1611 Unilateral primary osteoarthritis, right hip: Secondary | ICD-10-CM | POA: Diagnosis not present

## 2019-03-06 DIAGNOSIS — R188 Other ascites: Secondary | ICD-10-CM

## 2019-03-06 HISTORY — DX: Other ascites: R18.8

## 2019-03-06 NOTE — Telephone Encounter (Signed)
Spoke with [facility] her Dr. Darnell Level is giving verbal orders for services requested.

## 2019-03-06 NOTE — Telephone Encounter (Signed)
Best number [phone] [facility]   Finally making progress  Would like to continue PT for  2 week 2 1 week 3

## 2019-03-06 NOTE — Telephone Encounter (Signed)
Agree with this thank you.

## 2019-03-10 ENCOUNTER — Ambulatory Visit: Admission: RE | Admit: 2019-03-10 | Payer: Medicare PPO | Source: Ambulatory Visit

## 2019-03-11 ENCOUNTER — Other Ambulatory Visit: Payer: Self-pay | Admitting: Family Medicine

## 2019-03-11 DIAGNOSIS — H524 Presbyopia: Secondary | ICD-10-CM | POA: Diagnosis not present

## 2019-03-11 DIAGNOSIS — R296 Repeated falls: Secondary | ICD-10-CM | POA: Diagnosis not present

## 2019-03-11 DIAGNOSIS — Z87891 Personal history of nicotine dependence: Secondary | ICD-10-CM | POA: Diagnosis not present

## 2019-03-11 DIAGNOSIS — M1611 Unilateral primary osteoarthritis, right hip: Secondary | ICD-10-CM | POA: Diagnosis not present

## 2019-03-11 DIAGNOSIS — G600 Hereditary motor and sensory neuropathy: Secondary | ICD-10-CM | POA: Diagnosis not present

## 2019-03-11 DIAGNOSIS — Z87440 Personal history of urinary (tract) infections: Secondary | ICD-10-CM | POA: Diagnosis not present

## 2019-03-11 DIAGNOSIS — Z6838 Body mass index (BMI) 38.0-38.9, adult: Secondary | ICD-10-CM | POA: Diagnosis not present

## 2019-03-11 DIAGNOSIS — H9190 Unspecified hearing loss, unspecified ear: Secondary | ICD-10-CM | POA: Diagnosis not present

## 2019-03-11 DIAGNOSIS — H25013 Cortical age-related cataract, bilateral: Secondary | ICD-10-CM | POA: Diagnosis not present

## 2019-03-11 DIAGNOSIS — K7460 Unspecified cirrhosis of liver: Secondary | ICD-10-CM | POA: Diagnosis not present

## 2019-03-11 DIAGNOSIS — H2513 Age-related nuclear cataract, bilateral: Secondary | ICD-10-CM | POA: Diagnosis not present

## 2019-03-11 DIAGNOSIS — Z9181 History of falling: Secondary | ICD-10-CM | POA: Diagnosis not present

## 2019-03-11 DIAGNOSIS — H52223 Regular astigmatism, bilateral: Secondary | ICD-10-CM | POA: Diagnosis not present

## 2019-03-11 NOTE — Telephone Encounter (Signed)
Robaxin Last filled:  02/05/19, #30 Last OV:  02/05/19, acute for fall Next OV:  none

## 2019-03-13 DIAGNOSIS — H9190 Unspecified hearing loss, unspecified ear: Secondary | ICD-10-CM | POA: Diagnosis not present

## 2019-03-13 DIAGNOSIS — Z87891 Personal history of nicotine dependence: Secondary | ICD-10-CM | POA: Diagnosis not present

## 2019-03-13 DIAGNOSIS — Z87440 Personal history of urinary (tract) infections: Secondary | ICD-10-CM | POA: Diagnosis not present

## 2019-03-13 DIAGNOSIS — G600 Hereditary motor and sensory neuropathy: Secondary | ICD-10-CM | POA: Diagnosis not present

## 2019-03-13 DIAGNOSIS — M1611 Unilateral primary osteoarthritis, right hip: Secondary | ICD-10-CM | POA: Diagnosis not present

## 2019-03-13 DIAGNOSIS — Z6838 Body mass index (BMI) 38.0-38.9, adult: Secondary | ICD-10-CM | POA: Diagnosis not present

## 2019-03-13 DIAGNOSIS — Z9181 History of falling: Secondary | ICD-10-CM | POA: Diagnosis not present

## 2019-03-13 DIAGNOSIS — K7460 Unspecified cirrhosis of liver: Secondary | ICD-10-CM | POA: Diagnosis not present

## 2019-03-13 DIAGNOSIS — R296 Repeated falls: Secondary | ICD-10-CM | POA: Diagnosis not present

## 2019-03-14 DIAGNOSIS — R296 Repeated falls: Secondary | ICD-10-CM | POA: Diagnosis not present

## 2019-03-14 DIAGNOSIS — M1611 Unilateral primary osteoarthritis, right hip: Secondary | ICD-10-CM | POA: Diagnosis not present

## 2019-03-14 DIAGNOSIS — G600 Hereditary motor and sensory neuropathy: Secondary | ICD-10-CM | POA: Diagnosis not present

## 2019-03-14 DIAGNOSIS — R069 Unspecified abnormalities of breathing: Secondary | ICD-10-CM | POA: Diagnosis not present

## 2019-03-18 DIAGNOSIS — R296 Repeated falls: Secondary | ICD-10-CM | POA: Diagnosis not present

## 2019-03-18 DIAGNOSIS — Z6838 Body mass index (BMI) 38.0-38.9, adult: Secondary | ICD-10-CM | POA: Diagnosis not present

## 2019-03-18 DIAGNOSIS — Z9181 History of falling: Secondary | ICD-10-CM | POA: Diagnosis not present

## 2019-03-18 DIAGNOSIS — G600 Hereditary motor and sensory neuropathy: Secondary | ICD-10-CM | POA: Diagnosis not present

## 2019-03-18 DIAGNOSIS — Z87440 Personal history of urinary (tract) infections: Secondary | ICD-10-CM | POA: Diagnosis not present

## 2019-03-18 DIAGNOSIS — H9190 Unspecified hearing loss, unspecified ear: Secondary | ICD-10-CM | POA: Diagnosis not present

## 2019-03-18 DIAGNOSIS — Z87891 Personal history of nicotine dependence: Secondary | ICD-10-CM | POA: Diagnosis not present

## 2019-03-18 DIAGNOSIS — M1611 Unilateral primary osteoarthritis, right hip: Secondary | ICD-10-CM | POA: Diagnosis not present

## 2019-03-18 DIAGNOSIS — K7460 Unspecified cirrhosis of liver: Secondary | ICD-10-CM | POA: Diagnosis not present

## 2019-03-21 ENCOUNTER — Telehealth: Payer: Self-pay | Admitting: Family Medicine

## 2019-03-21 ENCOUNTER — Ambulatory Visit (INDEPENDENT_AMBULATORY_CARE_PROVIDER_SITE_OTHER): Payer: Medicare PPO | Admitting: Family Medicine

## 2019-03-21 ENCOUNTER — Encounter: Payer: Self-pay | Admitting: Family Medicine

## 2019-03-21 VITALS — BP 135/91 | HR 83 | Temp 98.6°F | Ht 70.0 in

## 2019-03-21 DIAGNOSIS — D61818 Other pancytopenia: Secondary | ICD-10-CM | POA: Diagnosis not present

## 2019-03-21 DIAGNOSIS — Z87891 Personal history of nicotine dependence: Secondary | ICD-10-CM | POA: Diagnosis not present

## 2019-03-21 DIAGNOSIS — R05 Cough: Secondary | ICD-10-CM

## 2019-03-21 DIAGNOSIS — M1611 Unilateral primary osteoarthritis, right hip: Secondary | ICD-10-CM | POA: Diagnosis not present

## 2019-03-21 DIAGNOSIS — H9190 Unspecified hearing loss, unspecified ear: Secondary | ICD-10-CM | POA: Diagnosis not present

## 2019-03-21 DIAGNOSIS — Z87440 Personal history of urinary (tract) infections: Secondary | ICD-10-CM | POA: Diagnosis not present

## 2019-03-21 DIAGNOSIS — G600 Hereditary motor and sensory neuropathy: Secondary | ICD-10-CM | POA: Diagnosis not present

## 2019-03-21 DIAGNOSIS — R296 Repeated falls: Secondary | ICD-10-CM | POA: Diagnosis not present

## 2019-03-21 DIAGNOSIS — R059 Cough, unspecified: Secondary | ICD-10-CM | POA: Insufficient documentation

## 2019-03-21 DIAGNOSIS — K7460 Unspecified cirrhosis of liver: Secondary | ICD-10-CM | POA: Diagnosis not present

## 2019-03-21 DIAGNOSIS — Z9181 History of falling: Secondary | ICD-10-CM | POA: Diagnosis not present

## 2019-03-21 DIAGNOSIS — R1011 Right upper quadrant pain: Secondary | ICD-10-CM | POA: Diagnosis not present

## 2019-03-21 DIAGNOSIS — Z6838 Body mass index (BMI) 38.0-38.9, adult: Secondary | ICD-10-CM | POA: Diagnosis not present

## 2019-03-21 NOTE — Telephone Encounter (Signed)
Noted  

## 2019-03-21 NOTE — Telephone Encounter (Addendum)
[name] is checking into this.  Actually it seems [name] will be able to check labs this weekend.  Labs faxed over.

## 2019-03-21 NOTE — Assessment & Plan Note (Signed)
Endorses several weeks of nonproductive cough without fever, no significant cough heard during virtual visit. Doubt covid, influenza, or bacterial infection (COPD exac or PNA). Will start with labwork. Encouraged continued albuterol use PRN.

## 2019-03-21 NOTE — Telephone Encounter (Signed)
Spoke with [name] of [facility] asking about having labs drawn on pt.  States nursing d/c pt about 2 wks ago and that he's only receiving PT now.

## 2019-03-21 NOTE — Assessment & Plan Note (Signed)
Several weeks of R sided abd pain associated with nausea, bloating, incomplete bladder emptying.  No fevers, diarrhea.  I want to further evaluate with labwork - will ask [facility] to come out to house for lab draw.

## 2019-03-21 NOTE — Telephone Encounter (Signed)
Can we call home health and ask a nurse to come out to the house for [place]?  I'd like:  CMP, CBC, INR, urinalysis with reflex to micro/culture if abnormal.  Indication K74.6 cirrhosis of liver Fax results to [phone]

## 2019-03-21 NOTE — Assessment & Plan Note (Signed)
Looks more anemic - will update CBC.

## 2019-03-21 NOTE — Progress Notes (Signed)
Virtual visit completed through [employer]. Due to national recommendations of social distancing due to COVID-19, a virtual visit is felt to be most appropriate for this patient at this time. Reviewed limitations of a virtual visit.   Patient location: home Provider location: [facility], office If any vitals were documented, they were collected by patient at home unless specified below.    BP (!) 135/91    Pulse 83    Temp 98.6 F (37 C)    Ht 5' 10"  (1.778 m)    SpO2 97%    BMI 37.59 kg/m    CC: abd pain, nausea, cough Subjective:    Patient ID: [name], male    DOB: 1955/10/18, 63 y.o.   MRN: [id number]  HPI: [name] is a 63 y.o. male presenting on 03/21/2019 for Abdominal Pain (C/o RUQ abd pain and bloating.  Has had some nausea and occasional vomiting.  Denies any diarrhea.  Sxs started a few wks ago.  ) and Cough (C/o cough, wheezing and HA. Started 3-4 wks ago.  )   Several week h/o RUQ abd pain, nausea, occasional vomiting, cough wheezing and headache. Incomplete bladder emptying without dysuria, hematuria. Non productive cough associated with wheezing and exertional dyspnea. Some mid abd swelling/bloating. No fevers/chills, diarrhea, significant chest pain, leg swelling. No orthopnea.   Noticing L breast tenderness at nipple without skin changes.   Known COPD, non alcoholic cirrhosis, CAD s/p stents and AAA s/p  endovascular stent 2017.   HH PT continues. Has electric wheelchair, has ramps, has hospital bed.      Relevant past medical, surgical, family and social history reviewed and updated as indicated. Interim medical history since our last visit reviewed. Allergies and medications reviewed and updated. Outpatient Medications Prior to Visit  Medication Sig Dispense Refill   albuterol (PROVENTIL HFA;VENTOLIN HFA) 108 (90 Base) MCG/ACT inhaler Inhale 2 puffs into the lungs every 6 (six) hours as needed for wheezing or shortness of breath. 1  Inhaler 0   albuterol (PROVENTIL) (2.5 MG/3ML) 0.083% nebulizer solution USE 1 VIAL PER NEBULIZER EVERY 6 HRS AS NEEDED FOR WHEEZING 75 mL 6   aspirin 81 MG tablet Take 1 tablet (81 mg total) by mouth daily.     B Complex-C (SUPER B COMPLEX PO) Take 1 tablet by mouth daily.     Ca Phosphate-Cholecalciferol [phone] MG-UNIT TABS Take by mouth.     cholecalciferol 2000 units TABS Take 2,000 Units by mouth daily.     Cobalamin Combinations (B-12) 1000-400 MCG SUBL Take by mouth.     dicyclomine (BENTYL) 20 MG tablet TAKE 1 TABLET BY MOUTH 3 TIMES DAILY BEFORE MEALS. AS NEEDED FOR ABDOMINAL PAIN 30 tablet 0   DULoxetine (CYMBALTA) 60 MG capsule Take 1 capsule (60 mg total) by mouth daily. 90 capsule 3   fentaNYL (DURAGESIC - DOSED MCG/HR) 50 MCG/HR Place 1 patch (50 mcg total) every other day onto the skin. 15 patch 0   fluticasone (FLONASE) 50 MCG/ACT nasal spray Place 2 sprays into both nostrils daily as needed for allergies.      folic acid (FOLVITE) 1 MG tablet Take 1 tablet (1 mg total) by mouth daily. 90 tablet 3   lactulose (CEPHULAC) 20 g packet Take 1 packet (20 g total) by mouth 3 (three) times daily. 30 each 0   loperamide (IMODIUM) 2 MG capsule Take 1 capsule (2 mg total) by mouth 4 (four) times daily as needed for diarrhea or loose  stools. 12 capsule 0   methocarbamol (ROBAXIN) 500 MG tablet TAKE 1 TABLET (500 MG TOTAL) BY MOUTH AT BEDTIME. 30 tablet 0   metoprolol succinate (TOPROL-XL) 25 MG 24 hr tablet Take 0.5 tablets (12.5 mg total) by mouth daily. 45 tablet 3   Misc Natural Products (TART CHERRY ADVANCED) CAPS Take 1 capsule by mouth 2 (two) times daily.     nitroGLYCERIN (NITROSTAT) 0.4 MG SL tablet Place 1 tablet (0.4 mg total) under the tongue every 5 (five) minutes as needed for chest pain. 25 tablet 12   nystatin (MYCOSTATIN/NYSTOP) powder Apply topically 2 (two) times daily. 60 g 11   nystatin cream (MYCOSTATIN) Apply 1 application topically 2 (two) times  daily. 80 g 0   omeprazole (PRILOSEC) 40 MG capsule TAKE 1 CAPSULE BY MOUTH EVERY DAY 30 capsule 11   ondansetron (ZOFRAN-ODT) 4 MG disintegrating tablet Take 1 tablet (4 mg total) by mouth every 8 (eight) hours as needed for nausea or vomiting. Day supply per insurance. 20 tablet 3   oxyCODONE (ROXICODONE) 15 MG immediate release tablet Take 1 tablet (15 mg total) by mouth every 4 (four) hours as needed. 90 tablet 0   polyethylene glycol powder (GLYCOLAX/MIRALAX) powder Take 17 g by mouth daily as needed for moderate constipation. 3350 g 0   Red Yeast Rice 600 MG CAPS Take 2 capsules by mouth daily.     vitamin B-12 (CYANOCOBALAMIN) 500 MCG tablet Take 1 tablet (500 mcg total) by mouth every Monday, Wednesday, and Friday.     No facility-administered medications prior to visit.      Per HPI unless specifically indicated in ROS section below Review of Systems Objective:    BP (!) 135/91    Pulse 83    Temp 98.6 F (37 C)    Ht 5' 10"  (1.778 m)    SpO2 97%    BMI 37.59 kg/m   Wt Readings from Last 3 Encounters:  02/04/19 262 lb (118.8 kg)  12/26/18 257 lb (116.6 kg)  12/04/18 247 lb (112 kg)     Physical exam: Gen: alert, NAD, obese, chronically ill appearing, pallor noted Pulm: speaks in complete sentences without increased work of breathing Psych: normal mood, normal thought content      Results for orders placed or performed in visit on 12/30/18  CBC and differential  Result Value Ref Range   Hemoglobin 10.8 (A) 13.5 - 17.5   Platelets 48 (A) 150 - 399   WBC 3.3   VITAMIN D 25 Hydroxy (Vit-D Deficiency, Fractures)  Result Value Ref Range   Vit D, 25-Hydroxy 93.9   Basic metabolic panel  Result Value Ref Range   Glucose 113    Creatinine 0.6 0.6 - 1.3   Potassium 3.8 3.4 - 5.3   Sodium 137 137 - 147  Lipid panel  Result Value Ref Range   Triglycerides 70 40 - 160   Cholesterol 127 0 - 200   HDL 42 35 - 70   LDL Cholesterol 71   Hepatic function panel  Result  Value Ref Range   Alkaline Phosphatase 152 (A) 25 - 125   ALT 10 10 - 40   AST 19 14 - 40   Bilirubin, Total 0.9   Vitamin B12  Result Value Ref Range   Vitamin B-12 1,859   Hemoglobin A1c  Result Value Ref Range   Hemoglobin A1C 4.8   TSH  Result Value Ref Range   TSH 1.41 0.41 - 5.90  Assessment & Plan:   Problem List Items Addressed This Visit    RUQ abdominal pain - Primary    Several weeks of R sided abd pain associated with nausea, bloating, incomplete bladder emptying.  No fevers, diarrhea.  I want to further evaluate with labwork - will ask H[facility]N to come out to house for lab draw.       Pancytopenia ([place])    Looks more anemic - will update CBC.       Cough    Endorses several weeks of nonproductive cough without fever, no significant cough heard during virtual visit. Doubt covid, influenza, or bacterial infection (COPD exac or PNA). Will start with labwork. Encouraged continued albuterol use PRN.       Cirrhosis of liver without ascites (HCC)    Update labwork to recalculate MELD score.            No orders of the defined types were placed in this encounter.  No orders of the defined types were placed in this encounter.   I discussed the assessment and treatment plan with the patient. The patient was provided an opportunity to ask questions and all were answered. The patient agreed with the plan and demonstrated an understanding of the instructions. The patient was advised to call back or seek an in-person evaluation if the symptoms worsen or if the condition fails to improve as anticipated.  Follow up plan: No follow-ups on file.  Ria Bush, MD

## 2019-03-21 NOTE — Assessment & Plan Note (Signed)
Update labwork to recalculate MELD score.

## 2019-03-24 ENCOUNTER — Other Ambulatory Visit: Payer: Self-pay

## 2019-03-24 ENCOUNTER — Emergency Department (HOSPITAL_COMMUNITY): Payer: Medicare PPO

## 2019-03-24 ENCOUNTER — Observation Stay (HOSPITAL_COMMUNITY)
Admission: EM | Admit: 2019-03-24 | Discharge: 2019-03-26 | Disposition: A | Discharge status: Home / Self Care | Payer: Medicare PPO | Attending: Internal Medicine | Admitting: Internal Medicine

## 2019-03-24 ENCOUNTER — Telehealth: Payer: Self-pay

## 2019-03-24 ENCOUNTER — Encounter (HOSPITAL_COMMUNITY): Payer: Self-pay

## 2019-03-24 DIAGNOSIS — K766 Portal hypertension: Secondary | ICD-10-CM | POA: Insufficient documentation

## 2019-03-24 DIAGNOSIS — I255 Ischemic cardiomyopathy: Secondary | ICD-10-CM | POA: Diagnosis not present

## 2019-03-24 DIAGNOSIS — R140 Abdominal distension (gaseous): Secondary | ICD-10-CM

## 2019-03-24 DIAGNOSIS — E1136 Type 2 diabetes mellitus with diabetic cataract: Secondary | ICD-10-CM | POA: Insufficient documentation

## 2019-03-24 DIAGNOSIS — E538 Deficiency of other specified B group vitamins: Secondary | ICD-10-CM | POA: Diagnosis not present

## 2019-03-24 DIAGNOSIS — K7460 Unspecified cirrhosis of liver: Secondary | ICD-10-CM

## 2019-03-24 DIAGNOSIS — D696 Thrombocytopenia, unspecified: Secondary | ICD-10-CM | POA: Diagnosis not present

## 2019-03-24 DIAGNOSIS — R0602 Shortness of breath: Secondary | ICD-10-CM | POA: Diagnosis not present

## 2019-03-24 DIAGNOSIS — I2510 Atherosclerotic heart disease of native coronary artery without angina pectoris: Secondary | ICD-10-CM | POA: Diagnosis not present

## 2019-03-24 DIAGNOSIS — Z888 Allergy status to other drugs, medicaments and biological substances status: Secondary | ICD-10-CM | POA: Insufficient documentation

## 2019-03-24 DIAGNOSIS — J449 Chronic obstructive pulmonary disease, unspecified: Secondary | ICD-10-CM | POA: Diagnosis present

## 2019-03-24 DIAGNOSIS — G600 Hereditary motor and sensory neuropathy: Secondary | ICD-10-CM | POA: Diagnosis not present

## 2019-03-24 DIAGNOSIS — Z23 Encounter for immunization: Secondary | ICD-10-CM | POA: Diagnosis not present

## 2019-03-24 DIAGNOSIS — J9601 Acute respiratory failure with hypoxia: Secondary | ICD-10-CM | POA: Diagnosis not present

## 2019-03-24 DIAGNOSIS — I252 Old myocardial infarction: Secondary | ICD-10-CM | POA: Diagnosis not present

## 2019-03-24 DIAGNOSIS — I714 Abdominal aortic aneurysm, without rupture, unspecified: Secondary | ICD-10-CM | POA: Diagnosis present

## 2019-03-24 DIAGNOSIS — F17200 Nicotine dependence, unspecified, uncomplicated: Secondary | ICD-10-CM | POA: Diagnosis present

## 2019-03-24 DIAGNOSIS — I110 Hypertensive heart disease with heart failure: Secondary | ICD-10-CM | POA: Insufficient documentation

## 2019-03-24 DIAGNOSIS — K7581 Nonalcoholic steatohepatitis (NASH): Secondary | ICD-10-CM | POA: Insufficient documentation

## 2019-03-24 DIAGNOSIS — F17210 Nicotine dependence, cigarettes, uncomplicated: Secondary | ICD-10-CM | POA: Diagnosis not present

## 2019-03-24 DIAGNOSIS — Z79891 Long term (current) use of opiate analgesic: Secondary | ICD-10-CM | POA: Insufficient documentation

## 2019-03-24 DIAGNOSIS — Z880 Allergy status to penicillin: Secondary | ICD-10-CM | POA: Insufficient documentation

## 2019-03-24 DIAGNOSIS — R52 Pain, unspecified: Secondary | ICD-10-CM | POA: Diagnosis not present

## 2019-03-24 DIAGNOSIS — R188 Other ascites: Secondary | ICD-10-CM | POA: Diagnosis not present

## 2019-03-24 DIAGNOSIS — Z6838 Body mass index (BMI) 38.0-38.9, adult: Secondary | ICD-10-CM | POA: Diagnosis not present

## 2019-03-24 DIAGNOSIS — E785 Hyperlipidemia, unspecified: Secondary | ICD-10-CM | POA: Diagnosis present

## 2019-03-24 DIAGNOSIS — D539 Nutritional anemia, unspecified: Secondary | ICD-10-CM | POA: Diagnosis not present

## 2019-03-24 DIAGNOSIS — Z885 Allergy status to narcotic agent status: Secondary | ICD-10-CM | POA: Insufficient documentation

## 2019-03-24 DIAGNOSIS — I5032 Chronic diastolic (congestive) heart failure: Secondary | ICD-10-CM | POA: Insufficient documentation

## 2019-03-24 DIAGNOSIS — I10 Essential (primary) hypertension: Secondary | ICD-10-CM | POA: Diagnosis not present

## 2019-03-24 DIAGNOSIS — K7469 Other cirrhosis of liver: Secondary | ICD-10-CM

## 2019-03-24 DIAGNOSIS — G894 Chronic pain syndrome: Secondary | ICD-10-CM | POA: Diagnosis present

## 2019-03-24 DIAGNOSIS — Z955 Presence of coronary angioplasty implant and graft: Secondary | ICD-10-CM | POA: Insufficient documentation

## 2019-03-24 DIAGNOSIS — Z20828 Contact with and (suspected) exposure to other viral communicable diseases: Secondary | ICD-10-CM | POA: Insufficient documentation

## 2019-03-24 DIAGNOSIS — Z9981 Dependence on supplemental oxygen: Secondary | ICD-10-CM | POA: Diagnosis not present

## 2019-03-24 DIAGNOSIS — Z8249 Family history of ischemic heart disease and other diseases of the circulatory system: Secondary | ICD-10-CM | POA: Insufficient documentation

## 2019-03-24 DIAGNOSIS — R0902 Hypoxemia: Secondary | ICD-10-CM | POA: Diagnosis not present

## 2019-03-24 DIAGNOSIS — Z7982 Long term (current) use of aspirin: Secondary | ICD-10-CM | POA: Insufficient documentation

## 2019-03-24 DIAGNOSIS — K219 Gastro-esophageal reflux disease without esophagitis: Secondary | ICD-10-CM | POA: Insufficient documentation

## 2019-03-24 DIAGNOSIS — Z79899 Other long term (current) drug therapy: Secondary | ICD-10-CM | POA: Insufficient documentation

## 2019-03-24 LAB — BRAIN NATRIURETIC PEPTIDE: B Natriuretic Peptide: 140.1 pg/mL — ABNORMAL HIGH (ref 0.0–100.0)

## 2019-03-24 LAB — URINALYSIS, ROUTINE W REFLEX MICROSCOPIC
Bilirubin Urine: NEGATIVE
Glucose, UA: NEGATIVE mg/dL
Hgb urine dipstick: NEGATIVE
Ketones, ur: NEGATIVE mg/dL
Leukocytes,Ua: NEGATIVE
Nitrite: NEGATIVE
Protein, ur: NEGATIVE mg/dL
Specific Gravity, Urine: 1.019 (ref 1.005–1.030)
pH: 6 (ref 5.0–8.0)

## 2019-03-24 LAB — LACTIC ACID, PLASMA
Lactic Acid, Venous: 1.2 mmol/L (ref 0.5–1.9)
Lactic Acid, Venous: 1.3 mmol/L (ref 0.5–1.9)

## 2019-03-24 LAB — CBC WITH DIFFERENTIAL/PLATELET
Abs Immature Granulocytes: 0.01 10*3/uL (ref 0.00–0.07)
Basophils Absolute: 0 10*3/uL (ref 0.0–0.1)
Basophils Relative: 1 %
Eosinophils Absolute: 0.1 10*3/uL (ref 0.0–0.5)
Eosinophils Relative: 2 %
HCT: 33.7 % — ABNORMAL LOW (ref 39.0–52.0)
Hemoglobin: 11.1 g/dL — ABNORMAL LOW (ref 13.0–17.0)
Immature Granulocytes: 0 %
Lymphocytes Relative: 20 %
Lymphs Abs: 0.8 10*3/uL (ref 0.7–4.0)
MCH: 33.5 pg (ref 26.0–34.0)
MCHC: 32.9 g/dL (ref 30.0–36.0)
MCV: 101.8 fL — ABNORMAL HIGH (ref 80.0–100.0)
Monocytes Absolute: 0.3 10*3/uL (ref 0.1–1.0)
Monocytes Relative: 8 %
Neutro Abs: 2.8 10*3/uL (ref 1.7–7.7)
Neutrophils Relative %: 69 %
Platelets: 49 10*3/uL — ABNORMAL LOW (ref 150–400)
RBC: 3.31 MIL/uL — ABNORMAL LOW (ref 4.22–5.81)
RDW: 15.2 % (ref 11.5–15.5)
WBC: 4.1 10*3/uL (ref 4.0–10.5)
nRBC: 0 % (ref 0.0–0.2)

## 2019-03-24 LAB — TROPONIN I (HIGH SENSITIVITY)
Troponin I (High Sensitivity): 6 ng/L (ref <18)
Troponin I (High Sensitivity): 7 ng/L (ref <18)

## 2019-03-24 LAB — COMPREHENSIVE METABOLIC PANEL
ALT: 13 U/L (ref 0–44)
AST: 25 U/L (ref 15–41)
Albumin: 2.2 g/dL — ABNORMAL LOW (ref 3.5–5.0)
Alkaline Phosphatase: 138 U/L — ABNORMAL HIGH (ref 38–126)
Anion gap: 6 (ref 5–15)
BUN: 11 mg/dL (ref 8–23)
CO2: 26 mmol/L (ref 22–32)
Calcium: 8.2 mg/dL — ABNORMAL LOW (ref 8.9–10.3)
Chloride: 104 mmol/L (ref 98–111)
Creatinine, Ser: 0.77 mg/dL (ref 0.61–1.24)
GFR calc Af Amer: >60 mL/min (ref >60)
GFR calc non Af Amer: >60 mL/min (ref >60)
Glucose, Bld: 99 mg/dL (ref 70–99)
Potassium: 4.1 mmol/L (ref 3.5–5.1)
Sodium: 136 mmol/L (ref 135–145)
Total Bilirubin: 1 mg/dL (ref 0.3–1.2)
Total Protein: 6.3 g/dL — ABNORMAL LOW (ref 6.5–8.1)

## 2019-03-24 LAB — SARS CORONAVIRUS 2 (TAT 6-24 HRS): SARS Coronavirus 2: NEGATIVE

## 2019-03-24 LAB — MAGNESIUM: Magnesium: 1.8 mg/dL (ref 1.7–2.4)

## 2019-03-24 LAB — PHOSPHORUS: Phosphorus: 3.1 mg/dL (ref 2.5–4.6)

## 2019-03-24 LAB — HIV ANTIBODY (ROUTINE TESTING W REFLEX): HIV Screen 4th Generation wRfx: NONREACTIVE

## 2019-03-24 LAB — PROTIME-INR
INR: 1.3 — ABNORMAL HIGH (ref 0.8–1.2)
Prothrombin Time: 16.1 seconds — ABNORMAL HIGH (ref 11.4–15.2)

## 2019-03-24 LAB — VITAMIN B12: Vitamin B-12: 1097 pg/mL — ABNORMAL HIGH (ref 180–914)

## 2019-03-24 MED ORDER — OXYCODONE HCL 5 MG PO TABS
15.0000 mg | ORAL_TABLET | Freq: Once | ORAL | Status: AC
  Administered 2019-03-24: 15 mg via ORAL
  Filled 2019-03-24: qty 3

## 2019-03-24 MED ORDER — VITAMIN B-12 100 MCG PO TABS
500.0000 ug | ORAL_TABLET | ORAL | Status: DC
  Administered 2019-03-24 – 2019-03-26 (×2): 500 ug via ORAL
  Filled 2019-03-24 (×2): qty 5

## 2019-03-24 MED ORDER — METOPROLOL SUCCINATE ER 25 MG PO TB24
12.5000 mg | ORAL_TABLET | Freq: Every day | ORAL | Status: DC
  Administered 2019-03-24 – 2019-03-26 (×3): 12.5 mg via ORAL
  Filled 2019-03-24 (×3): qty 1

## 2019-03-24 MED ORDER — NITROGLYCERIN 0.4 MG SL SUBL: 0.4000 mg | SUBLINGUAL_TABLET | SUBLINGUAL | Status: DC | PRN

## 2019-03-24 MED ORDER — ALBUTEROL SULFATE HFA 108 (90 BASE) MCG/ACT IN AERS
2.0000 | INHALATION_SPRAY | Freq: Once | RESPIRATORY_TRACT | Status: AC
  Administered 2019-03-24: 2 via RESPIRATORY_TRACT
  Filled 2019-03-24: qty 6.7

## 2019-03-24 MED ORDER — ONDANSETRON HCL 4 MG PO TABS: 4.0000 mg | ORAL_TABLET | Freq: Four times a day (QID) | ORAL | Status: DC | PRN

## 2019-03-24 MED ORDER — FOLIC ACID 1 MG PO TABS
1.0000 mg | ORAL_TABLET | Freq: Every day | ORAL | Status: DC
  Administered 2019-03-24 – 2019-03-26 (×3): 1 mg via ORAL
  Filled 2019-03-24 (×3): qty 1

## 2019-03-24 MED ORDER — ASPIRIN 81 MG PO CHEW
81.0000 mg | CHEWABLE_TABLET | Freq: Every day | ORAL | Status: DC
  Administered 2019-03-24 – 2019-03-26 (×3): 81 mg via ORAL
  Filled 2019-03-24 (×3): qty 1

## 2019-03-24 MED ORDER — PANTOPRAZOLE SODIUM 40 MG PO TBEC
40.0000 mg | DELAYED_RELEASE_TABLET | Freq: Every day | ORAL | Status: DC
  Administered 2019-03-24 – 2019-03-26 (×3): 40 mg via ORAL
  Filled 2019-03-24 (×3): qty 1

## 2019-03-24 MED ORDER — DICYCLOMINE HCL 20 MG PO TABS
20.0000 mg | ORAL_TABLET | Freq: Three times a day (TID) | ORAL | Status: DC
  Administered 2019-03-24 – 2019-03-26 (×6): 20 mg via ORAL
  Filled 2019-03-24 (×7): qty 1

## 2019-03-24 MED ORDER — DULOXETINE HCL 60 MG PO CPEP
60.0000 mg | ORAL_CAPSULE | Freq: Every day | ORAL | Status: DC
  Administered 2019-03-24 – 2019-03-26 (×3): 60 mg via ORAL
  Filled 2019-03-24 (×3): qty 1

## 2019-03-24 MED ORDER — LACTULOSE 10 GM/15ML PO SOLN
20.0000 g | Freq: Three times a day (TID) | ORAL | Status: DC
  Administered 2019-03-24 – 2019-03-26 (×6): 20 g via ORAL
  Filled 2019-03-24 (×5): qty 30

## 2019-03-24 MED ORDER — ALBUTEROL SULFATE (2.5 MG/3ML) 0.083% IN NEBU: 2.5000 mg | INHALATION_SOLUTION | Freq: Four times a day (QID) | RESPIRATORY_TRACT | Status: DC | PRN

## 2019-03-24 MED ORDER — OXYCODONE HCL 5 MG PO TABS
15.0000 mg | ORAL_TABLET | ORAL | Status: DC | PRN
  Administered 2019-03-24 – 2019-03-26 (×4): 15 mg via ORAL
  Filled 2019-03-24 (×4): qty 3

## 2019-03-24 MED ORDER — FENTANYL 50 MCG/HR TD PT72
1.0000 | MEDICATED_PATCH | TRANSDERMAL | Status: DC
  Administered 2019-03-24: 1 via TRANSDERMAL
  Filled 2019-03-24: qty 1

## 2019-03-24 MED ORDER — ONDANSETRON HCL 4 MG/2ML IJ SOLN: 4.0000 mg | Freq: Four times a day (QID) | INTRAMUSCULAR | Status: DC | PRN

## 2019-03-24 NOTE — ED Notes (Signed)
ED TO INPATIENT HANDOFF REPORT  ED Nurse Name and Phone #: [facility] [phone]  S Name/Age/Gender [name] 63 y.o. male Room/Bed: 025C/025C  Code Status   Code Status: Full Code  Home/SNF/Other Home Patient oriented to: self, place, time and situation Is this baseline? Yes   Triage Complete: Triage complete  Chief Complaint sob/cirrhosis  Triage Note Pt arrives EMS from home with c/o increased abdominal tightness and pain and SHOB x1-2 weeks. Arrives on 2l/[place] for [facility] with exertion.   Allergies Allergies  Allergen Reactions  . Statins Shortness Of Breath    Cough, trouble breathing Cough, trouble breathing  . Losartan Other (See Comments)    Causes him to have pain  . Penicillins     Did it involve swelling of the face/tongue/throat, SOB, or low BP? N/A Did it involve sudden or severe rash/hives, skin peeling, or any reaction on the inside of your mouth or nose? N/A Did you need to seek medical attention at a hospital or doctor's office? N/A When did it last happen? Child If all above answers are "NO", may proceed with cephalosporin use.  . Tramadol Nausea Only  . Allopurinol Nausea Only  . Baclofen Nausea And Vomiting  . Gabapentin Nausea And Vomiting    Level of Care/Admitting Diagnosis ED Disposition    ED Disposition Condition [facility] Area: [place] [[id number]]  Level of Care: Telemetry Medical [104]  I expect the patient will be discharged within 24 hours: Yes  LOW acuity---Tx typically complete <24 hrs---ACUTE conditions typically can be evaluated <24 hours---LABS likely to return to acceptable levels <24 hours---IS near functional baseline---EXPECTED to return to current living arrangement---NOT newly hypoxic: Meets criteria for 5C-Observation unit  Covid Evaluation: Asymptomatic Screening Protocol (No Symptoms)  Diagnosis: Cirrhosis of liver with ascites [facility]) [[id number]]  Admitting Physician: Mckinley Jewel  [[id number]]  Attending Physician: Mckinley Jewel [phone]  PT Class (Do Not Modify): Observation [104]  PT Acc Code (Do Not Modify): Observation [10022]       B Medical/Surgery History Past Medical History:  Diagnosis Date  . AAA (abdominal aortic aneurysm) ([place]) 09/2012--  monitored by dr Trula Slade   stable 5.6cm CTA abdomen 2016  . Abnormal drug screen 07/09/2016   1/2/018 - positive oxycodone, fentanyl, inapprop positive MJ - mod risk  . Allergic rhinitis   . B12 deficiency   . CAD (coronary artery disease) cardiologist-  dr Stanford Breed   x3 with stents last 2005, EF 40%, predominantly RCA by CT 2016  . Cataracts, bilateral   . Cervical spondylosis 05/2010   s/p surgery  . Charcot Marie Tooth muscular atrophy dx  [phone]   neurologist--  dr love--  type 2 per pt  . Chronic pain syndrome    established with [facility] ([place]) --> disagreement and transfered care to Dr Sanjuan Dame at [facility], requests PCP write Rx but f/u with pain clinic Q6-12 months  . COPD (chronic obstructive pulmonary disease) ([place]) 10/2011   minimal by PFTs  . DDD (degenerative disc disease)   . Disturbances of sensation of smell and taste    improving  . Dyspnea on exertion   . GERD (gastroesophageal reflux disease)   . Gout   . Headache   . Hepatitis    hepatitis B  . Hidradenitis    right groin  . Hidradenitis suppurativa dx 2011   goin and leg crease   followd by Lyndle Herrlich - daily bactrim,  s/p intralesional steroid injection 10/2010  . Hip osteoarthritis    s/p intraarticular steroid shot (12/2012) ([place])  . History of hepatitis B 1983  . History of MI (myocardial infarction)    2000  &  2005  . History of pneumonia   . History of viral meningitis 2000  . HLD (hyperlipidemia)   . HTN (hypertension)   . Ischemic cardiomyopathy    s/p inferior MI  --  current ef per myoview 39%  . Liver cirrhosis secondary to NASH ([place]) 01/2014   by CT scan, rec virtual  colonoscopy by Dr Collene Mares 06/2014  . Lumbar herniated disc   . Myocardial infarction ([place])    x2  . Nocturia more than twice per night   . Obesity   . Spinal stenosis    released from [place].  established with preferred pain (07/2013)  . T2DM (type 2 diabetes mellitus) ([place])    ABIs WNL 2016  . Vitamin D deficiency    Past Surgical History:  Procedure Laterality Date  . ABDOMINAL AORTIC ENDOVASCULAR FENESTRATED STENT GRAFT N/A 11/30/2015   Procedure: ABDOMINAL AORTIC ENDOVASCULAR FENESTRATED STENT GRAFT;  Surgeon: Serafina Mitchell, MD;  Location: [facility];  Service: Vascular;  Laterality: N/A;  . ANTERIOR CERVICAL DECOMP/DISCECTOMY FUSION  01-07-2010    C4 -- C7  . CARDIAC CATHETERIZATION  03-30-2005  dr Albertine Patricia   ef 40% w/ inferior akinesis/  LM and CFX angiographically normal/  pLAD 30%/   Widely patent stents in RCA and PDA widely patent  . CARDIOVASCULAR STRESS TEST  10-23-2012  dr Stanford Breed   No ischemia/  Moderate scar in the inferior wall, otherwise normal perfusion/  LV ef 39%,  LV wall motion: inferior/ inferolateral hypokinesis  . COLONOSCOPY  05/06/2007   normal, small int hemorrhoids rpt 5 yrs due to fmhx - rec against rpt colonoscopy by Dr Collene Mares  . CORONARY ANGIOPLASTY  2000  dr Stanford Breed   PCI to RCA and PDA  . CORONARY ANGIOPLASTY WITH STENT PLACEMENT  03-19-2005  dr Gwyndolyn Saxon downey   inferior STEMI--- DES x4 to RCA w/ balloon angioplasty and balloon angioplasty to jailed PDA ostium/  severe hypokinesis of midinferor wall, ef 50%/  dLM 20%,  mLAD 20%,  dCFX 60%  . ESOPHAGOGASTRODUODENOSCOPY  01/2017   dilated benign esophageal stenosis, portal hypertensive gastropathy Henrene Pastor)  . ESOPHAGOGASTRODUODENOSCOPY (EGD) WITH PROPOFOL N/A 02/20/2018   benign biopsy [name], MD)  . HYDRADENITIS EXCISION Right 12/31/2014   Procedure: WIDE EXCISION HIDRADENITIS GROIN; Coralie Keens, MD  . LUMBAR DISC SURGERY     L5-S1  . LUMBAR LAMINECTOMY  05-18-2010   L2--5   laminectomy/foraminotomy for  stenosis Joya Salm)  . MYELOGRAM     L5-S1 and L1-2 spondylosis  . SACROILIAC JOINT INJECTION Bilateral 10/2013   [place]  . TONSILLECTOMY AND ADENOIDECTOMY  1972     A IV Location/Drains/Wounds Patient Lines/Drains/Airways Status   Active Line/Drains/Airways    Name:   Placement date:   Placement time:   Site:   Days:   Peripheral IV 03/19/18 Left Antecubital   03/19/18    0845    Antecubital   370   Airway   02/20/18    1330     397   Incision (Closed) 12/31/14 Groin Right   12/31/14    0745     1544   Incision (Closed) 11/30/15 Groin Left   11/30/15    1333     1210   Incision (Closed) 11/30/15 Groin Right  11/30/15    1333     1210          Intake/Output Last 24 hours No intake or output data in the 24 hours ending 03/24/19 1648  Labs/Imaging Results for orders placed or performed during the hospital encounter of 03/24/19 (from the past 48 hour(s))  Comprehensive metabolic panel     Status: Abnormal   Collection Time: 03/24/19 12:30 PM  Result Value Ref Range   Sodium 136 135 - 145 mmol/L   Potassium 4.1 3.5 - 5.1 mmol/L   Chloride 104 98 - 111 mmol/L   CO2 26 22 - 32 mmol/L   Glucose, Bld 99 70 - 99 mg/dL   BUN 11 8 - 23 mg/dL   Creatinine, Ser 0.77 0.61 - 1.24 mg/dL   Calcium 8.2 (L) 8.9 - 10.3 mg/dL   Total Protein 6.3 (L) 6.5 - 8.1 g/dL   Albumin 2.2 (L) 3.5 - 5.0 g/dL   AST 25 15 - 41 U/L   ALT 13 0 - 44 U/L   Alkaline Phosphatase 138 (H) 38 - 126 U/L   Total Bilirubin 1.0 0.3 - 1.2 mg/dL   GFR calc non Af Amer >60 >60 mL/min   GFR calc Af Amer >60 >60 mL/min   Anion gap 6 5 - 15    Comment: Performed at [facility], [address] [place], [place] [address]  CBC with Differential     Status: Abnormal   Collection Time: 03/24/19 12:30 PM  Result Value Ref Range   WBC 4.1 4.0 - 10.5 K/uL   RBC 3.31 (L) 4.22 - 5.81 MIL/uL   Hemoglobin 11.1 (L) 13.0 - 17.0 g/dL   HCT 33.7 (L) 39.0 - 52.0 %   MCV 101.8 (H) 80.0 - 100.0 fL   MCH 33.5 26.0 - 34.0 pg    MCHC 32.9 30.0 - 36.0 g/dL   RDW 15.2 11.5 - 15.5 %   Platelets 49 (L) 150 - 400 K/uL    Comment: REPEATED TO VERIFY PLATELET COUNT CONFIRMED BY SMEAR Immature Platelet Fraction may be clinically indicated, consider ordering this additional test [id number]    nRBC 0.0 0.0 - 0.2 %   Neutrophils Relative % 69 %   Neutro Abs 2.8 1.7 - 7.7 K/uL   Lymphocytes Relative 20 %   Lymphs Abs 0.8 0.7 - 4.0 K/uL   Monocytes Relative 8 %   Monocytes Absolute 0.3 0.1 - 1.0 K/uL   Eosinophils Relative 2 %   Eosinophils Absolute 0.1 0.0 - 0.5 K/uL   Basophils Relative 1 %   Basophils Absolute 0.0 0.0 - 0.1 K/uL   Immature Granulocytes 0 %   Abs Immature Granulocytes 0.01 0.00 - 0.07 K/uL    Comment: Performed at [facility], [place] [address] [place],  [address]  Protime-INR     Status: Abnormal   Collection Time: 03/24/19 12:30 PM  Result Value Ref Range   Prothrombin Time 16.1 (H) 11.4 - 15.2 seconds   INR 1.3 (H) 0.8 - 1.2    Comment: (NOTE) INR goal varies based on device and disease states. Performed at [facility], [place] [address] [place], [place] [address]   Troponin I (High Sensitivity)     Status: None   Collection Time: 03/24/19 12:30 PM  Result Value Ref Range   Troponin I (High Sensitivity) 6 <18 ng/L    Comment: (NOTE) Elevated high sensitivity troponin I (hsTnI) values and significant  changes across serial  measurements may suggest ACS but many other  chronic and acute conditions are known to elevate hsTnI results.  Refer to the "Links" section for chest pain algorithms and additional  guidance. Performed at [facility], [place] [address], [place], [place] [address]   Brain natriuretic peptide     Status: Abnormal   Collection Time: 03/24/19 12:30 PM  Result Value Ref Range   B Natriuretic Peptide 140.1 (H) 0.0 - 100.0 pg/mL    Comment: Performed at [facility] [address] [place], [place] [address]  Lactic acid, plasma     Status:  None   Collection Time: 03/24/19 12:30 PM  Result Value Ref Range   Lactic Acid, Venous 1.2 0.5 - 1.9 mmol/L    Comment: Performed at [facility] [address] [place], [place] [address]  Troponin I (High Sensitivity)     Status: None   Collection Time: 03/24/19  1:59 PM  Result Value Ref Range   Troponin I (High Sensitivity) 7 <18 ng/L    Comment: (NOTE) Elevated high sensitivity troponin I (hsTnI) values and significant  changes across serial measurements may suggest ACS but many other  chronic and acute conditions are known to elevate hsTnI results.  Refer to the "Links" section for chest pain algorithms and additional  guidance. Performed at [facility], [place] [address], [place], [place] [address]    Dg Chest Port 1 View  Result Date: 03/24/2019 CLINICAL DATA:  Shortness of breath.  Hypertension. EXAM: PORTABLE CHEST 1 VIEW COMPARISON:  November 30, 2015 FINDINGS: There is no edema or consolidation. Heart is borderline enlarged with pulmonary vascularity normal. No adenopathy. There is postoperative change in the lower cervical region. IMPRESSION: No edema or consolidation.  Borderline cardiac enlargement. Electronically Signed   By: Lowella Grip III M.D.   On: 03/24/2019 12:28    Pending Labs Unresulted Labs (From admission, onward)    Start     Ordered   03/25/19 0938  Basic metabolic panel  Tomorrow morning,   R     03/24/19 1544   03/25/19 0500  CBC  Tomorrow morning,   R     03/24/19 1544   03/25/19 0500  Protime-INR  Tomorrow morning,   R     03/24/19 1544   03/25/19 0500  APTT  Tomorrow morning,   R     03/24/19 1544   03/24/19 1546  Vitamin B12  Add-on,   AD     03/24/19 1545   03/24/19 1546  Folate RBC  Add-on,   AD     03/24/19 1545   03/24/19 1545  Magnesium  Once,   STAT     03/24/19 1544   03/24/19 1545  Phosphorus  Once,   STAT     03/24/19 1544   03/24/19 1542  HIV Antibody (routine testing w rflx)  (HIV Antibody (Routine testing w  reflex) panel)  Once,   STAT     03/24/19 1544   03/24/19 1516  SARS CORONAVIRUS 2 (TAT 6-24 HRS) Nasopharyngeal Nasopharyngeal Swab  (Asymptomatic/Tier 2 Patients Labs)  Once,   STAT    Question Answer Comment  Is this test for diagnosis or screening Screening   Symptomatic for COVID-19 as defined by CDC No   Hospitalized for COVID-19 No   Admitted to ICU for COVID-19 No   Previously tested for COVID-19 No   Resident in a congregate (group) care setting No   Employed in  healthcare setting No      03/24/19 1515   03/24/19 1202  Lactic acid, plasma  Now then every 2 hours,   STAT     03/24/19 1201   03/24/19 1159  Urinalysis, Routine w reflex microscopic  ONCE - STAT,   STAT     03/24/19 1159          Vitals/Pain Today's Vitals   03/24/19 1415 03/24/19 1430 03/24/19 1445 03/24/19 1628  BP: 93/79 130/71 128/68   Pulse:   75   Resp: (!) 31 18 15    Temp:      TempSrc:      SpO2:   98%   Weight:      Height:      PainSc:    7     Isolation Precautions No active isolations  Medications Medications  ondansetron (ZOFRAN) tablet 4 mg (has no administration in time range)    Or  ondansetron (ZOFRAN) injection 4 mg (has no administration in time range)  fentaNYL (DURAGESIC) 50 MCG/HR 1 patch (has no administration in time range)  oxyCODONE (Oxy IR/ROXICODONE) immediate release tablet 15 mg (has no administration in time range)  metoprolol succinate (TOPROL-XL) 24 hr tablet 12.5 mg (has no administration in time range)  nitroGLYCERIN (NITROSTAT) SL tablet 0.4 mg (has no administration in time range)  DULoxetine (CYMBALTA) DR capsule 60 mg (has no administration in time range)  lactulose (CEPHULAC) packet 20 g (has no administration in time range)  dicyclomine (BENTYL) tablet 20 mg (has no administration in time range)  pantoprazole (PROTONIX) EC tablet 40 mg (has no administration in time range)  folic acid (FOLVITE) tablet 1 mg (has no administration in time range)  vitamin  B-12 (CYANOCOBALAMIN) tablet 500 mcg (has no administration in time range)  albuterol (PROVENTIL) (2.5 MG/3ML) 0.083% nebulizer solution 2.5 mg (has no administration in time range)  aspirin chewable tablet 81 mg (has no administration in time range)  albuterol (VENTOLIN HFA) 108 (90 Base) MCG/ACT inhaler 2 puff (2 puffs Inhalation Given 03/24/19 1408)  oxyCODONE (Oxy IR/ROXICODONE) immediate release tablet 15 mg (15 mg Oral Given 03/24/19 1609)    Mobility walks     Focused Assessments Pulmonary Assessment Handoff:  Lung sounds: Bilateral Breath Sounds: Diminished, Expiratory wheezes O2 Device: Nasal Cannula O2 Flow Rate (L/min): 2 L/min      R Recommendations: See Admitting Provider Note  Report given to:   Additional Notes:resp

## 2019-03-24 NOTE — ED Triage Notes (Addendum)
Pt arrives EMS from home with c/o increased abdominal tightness and pain and SHOB x1-2 weeks. Arrives on 2l/ for [facility] with exertion.

## 2019-03-24 NOTE — Progress Notes (Signed)
NEW ADMISSION NOTE New Admission Note:   Arrival Method: Stretcher Mental Orientation: A&O X4 Telemetry: [id number] Assessment: Completed Skin: See Flowsheets IV: WDL Pain: Denies Safety Measures: Safety Fall Prevention Plan has been given, discussed and signed Admission: Completed [facility] Orientation: Patient has been orientated to the room, unit and staff.   Orders have been reviewed and implemented. Will continue to monitor the patient. Call light has been placed within reach and bed alarm has been activated.   Aneta Mins BSN, RN3

## 2019-03-24 NOTE — ED Notes (Signed)
Pt offered in/out cath. States he feels he can pee.

## 2019-03-24 NOTE — H&P (Signed)
History and Physical    ROHN FRITSCH ZHG:[id number] DOB: [birth year] DOA: 03/24/2019  PCP: Ria Bush, MD  Patient coming from: Home  I have personally briefly reviewed patient's old medical records in [place]  Chief Complaint: Worsening abdominal distention and shortness of breath since couple of weeks.  HPI: [name] is a 63 y.o. male with medical history significant of hypertension, hyperlipidemia, coronary artery disease s/p stents, chronic pain syndrome, AAA, COPD, diabetes, liver cirrhosis due to [place] tooth muscular atrophy, tobacco abuse, morbid obesity presents to emergency department due to worsening abdominal distention and shortness of breath since couple of weeks.  Patient reports that he had telemedicine visit with his PCP on 10/16 for similar symptoms and he was scheduled to have home health nurse come to his home today to obtain screening labs.  Eventually got to the house the nurse was concerned given patient's abdominal distention and shortness of breath and called PCPs office regarding ED evaluation and recommended that she called EMS and transport patient to the emergency department for further evaluation and management.  No history of previous paracentesis.  He is not on home oxygen.  Patient denies abdominal pain, fever, chills, hematemesis, melena, decreased appetite, weakness, fatigue or lethargy.  He has exertional shortness of breath and chronic leg swelling however denies association with chest pain, palpitation, orthopnea, PND.  He is compliant with his home medications.  He is on fentanyl patch and oxycodone for his chronic pain syndrome.  He lives with his wife at home, smokes 1 pack of cigarettes per day, denies alcohol, illicit drug use.  ED Course: Upon arrival: Patient was placed on 2 L of oxygen via nasal cannula and was try to wean off of oxygen however his oxygen dips down to 92%.  Chest x-ray shows borderline  cardiac enlargement.  Lactic acid: WNL, patient is afebrile, no leukocytosis, BNP: 140, IR was consulted for paracentesis however they will not be able to get the patient until tomorrow a.m.  Review of Systems: As per HPI otherwise negative.    Past Medical History:  Diagnosis Date  . AAA (abdominal aortic aneurysm) ([place]) 09/2012--  monitored by dr Trula Slade   stable 5.6cm CTA abdomen 2016  . Abnormal drug screen 07/09/2016   1/2/018 - positive oxycodone, fentanyl, inapprop positive MJ - mod risk  . Allergic rhinitis   . B12 deficiency   . CAD (coronary artery disease) cardiologist-  dr Stanford Breed   x3 with stents last 2005, EF 40%, predominantly RCA by CT 2016  . Cataracts, bilateral   . Cervical spondylosis 05/2010   s/p surgery  . Charcot Marie Tooth muscular atrophy dx  [phone]   neurologist--  dr love--  type 2 per pt  . Chronic pain syndrome    [facility] with [facility] --> disagreement and transfered care to Dr Sanjuan Dame at [facility], requests PCP write Rx but f/u with pain clinic Q6-12 months  . COPD (chronic obstructive pulmonary disease) ([place]) 10/2011   minimal by PFTs  . DDD (degenerative disc disease)   . Disturbances of sensation of smell and taste    improving  . Dyspnea on exertion   . GERD (gastroesophageal reflux disease)   . Gout   . Headache   . Hepatitis    hepatitis B  . Hidradenitis    right groin  . Hidradenitis suppurativa dx 2011   goin and leg crease   followd by Lyndle Herrlich - daily  bactrim, s/p intralesional steroid injection 10/2010  . Hip osteoarthritis    s/p intraarticular steroid shot (12/2012) ([place])  . History of hepatitis B 1983  . History of MI (myocardial infarction)    2000  &  2005  . History of pneumonia   . History of viral meningitis 2000  . HLD (hyperlipidemia)   . HTN (hypertension)   . Ischemic cardiomyopathy    s/p inferior MI  --  current ef per myoview 39%  . Liver cirrhosis secondary to  NASH ([place]) 01/2014   by CT scan, rec virtual colonoscopy by Dr Collene Mares 06/2014  . Lumbar herniated disc   . Myocardial infarction ([place])    x2  . Nocturia more than twice per night   . Obesity   . Spinal stenosis    released from [place].  established with preferred [facility] (07/2013)  . T2DM (type 2 diabetes mellitus) ([place])    ABIs WNL 2016  . Vitamin D deficiency     Past Surgical History:  Procedure Laterality Date  . ABDOMINAL AORTIC ENDOVASCULAR FENESTRATED STENT GRAFT N/A 11/30/2015   Procedure: ABDOMINAL AORTIC ENDOVASCULAR FENESTRATED STENT GRAFT;  Surgeon: Serafina Mitchell, MD;  Location: [facility];  Service: Vascular;  Laterality: N/A;  . ANTERIOR CERVICAL DECOMP/DISCECTOMY FUSION  01-07-2010    C4 -- C7  . CARDIAC CATHETERIZATION  03-30-2005  dr Albertine Patricia   ef 40% w/ inferior akinesis/  LM and CFX angiographically normal/  pLAD 30%/   Widely patent stents in RCA and PDA widely patent  . CARDIOVASCULAR STRESS TEST  10-23-2012  dr Stanford Breed   No ischemia/  Moderate scar in the inferior wall, otherwise normal perfusion/  LV ef 39%,  LV wall motion: inferior/ inferolateral hypokinesis  . COLONOSCOPY  05/06/2007   normal, small int hemorrhoids rpt 5 yrs due to fmhx - rec against rpt colonoscopy by Dr Collene Mares  . CORONARY ANGIOPLASTY  2000  dr Stanford Breed   PCI to RCA and PDA  . CORONARY ANGIOPLASTY WITH STENT PLACEMENT  03-19-2005  dr Gwyndolyn Saxon downey   inferior STEMI--- DES x4 to RCA w/ balloon angioplasty and balloon angioplasty to jailed PDA ostium/  severe hypokinesis of midinferor wall, ef 50%/  dLM 20%,  mLAD 20%,  dCFX 60%  . ESOPHAGOGASTRODUODENOSCOPY  01/2017   dilated benign esophageal stenosis, portal hypertensive gastropathy Henrene Pastor)  . ESOPHAGOGASTRODUODENOSCOPY (EGD) WITH PROPOFOL N/A 02/20/2018   benign biopsy [name], MD)  . HYDRADENITIS EXCISION Right 12/31/2014   Procedure: WIDE EXCISION HIDRADENITIS GROIN; Coralie Keens, MD  . LUMBAR DISC SURGERY     L5-S1  . LUMBAR LAMINECTOMY   05-18-2010   L2--5   laminectomy/foraminotomy for stenosis Joya Salm)  . MYELOGRAM     L5-S1 and L1-2 spondylosis  . SACROILIAC JOINT INJECTION Bilateral 10/2013   [place]  . [place]     reports that he has been smoking cigarettes and e-cigarettes. He started smoking about 51 years ago. He has a 57.00 pack-year smoking history. He has never used smokeless tobacco. He reports current drug use. Drugs: Fentanyl and Hydrocodone. He reports that he does not drink alcohol.  Allergies  Allergen Reactions  . Statins Shortness Of Breath    Cough, trouble breathing Cough, trouble breathing  . Losartan Other (See Comments)    Causes him to have pain  . Penicillins   . Tramadol Nausea Only  . Allopurinol Nausea Only  . Baclofen Nausea And Vomiting  . Gabapentin Nausea And Vomiting  Family History  Problem Relation Age of Onset  . Cancer Mother        colon  . Diabetes Mother   . Kidney disease Mother   . Aneurysm Mother        AAA  . Rheum arthritis Mother   . Charcot-Marie-Tooth disease Mother   . Heart disease Mother        before age 41  . Cancer Father        skin  . Heart attack Father   . Heart disease Father        before age 25  . Cancer Brother        skin  . Coronary artery disease Brother   . Cancer Brother        small cell lung cancer  . Aneurysm Brother        AAA  . Rheum arthritis Sister   . Rheum arthritis Brother   . Prostate cancer Neg Hx   . Bladder Cancer Neg Hx   . Kidney cancer Neg Hx     Prior to Admission medications   Medication Sig Start Date End Date Taking? Authorizing Provider  albuterol (PROVENTIL HFA;VENTOLIN HFA) 108 (90 Base) MCG/ACT inhaler Inhale 2 puffs into the lungs every 6 (six) hours as needed for wheezing or shortness of breath. 08/17/15   Jearld Fenton, NP  albuterol (PROVENTIL) (2.5 MG/3ML) 0.083% nebulizer solution USE 1 VIAL PER NEBULIZER EVERY 6 HRS AS NEEDED FOR WHEEZING 07/19/15   Ria Bush, MD  aspirin 81 MG tablet Take 1 tablet (81 mg total) by mouth daily. 10/18/18   Lelon Perla, MD  B Complex-C (SUPER B COMPLEX PO) Take 1 tablet by mouth daily.    [provider]  Ca Phosphate-Cholecalciferol [phone] MG-UNIT TABS Take by mouth. 04/29/16   [provider]  cholecalciferol 2000 units TABS Take 2,000 Units by mouth daily. 04/29/16   Ria Bush, MD  Cobalamin Combinations (B-12) 1000-400 MCG SUBL Take by mouth.    [provider]  dicyclomine (BENTYL) 20 MG tablet TAKE 1 TABLET BY MOUTH 3 TIMES DAILY BEFORE MEALS. AS NEEDED FOR ABDOMINAL PAIN 06/24/18   Ria Bush, MD  DULoxetine (CYMBALTA) 60 MG capsule Take 1 capsule (60 mg total) by mouth daily. 12/04/18   Ria Bush, MD  fentaNYL (DURAGESIC - DOSED MCG/HR) 50 MCG/HR Place 1 patch (50 mcg total) every other day onto the skin. 04/19/17   Ria Bush, MD  fluticasone [facility]) 50 MCG/ACT nasal spray Place 2 sprays into both nostrils daily as needed for allergies.  02/09/12   Ria Bush, MD  folic acid (FOLVITE) 1 MG tablet Take 1 tablet (1 mg total) by mouth daily. 12/26/18   Ria Bush, MD  lactulose (CEPHULAC) 20 g packet Take 1 packet (20 g total) by mouth 3 (three) times daily. 04/12/18   [name], MD  loperamide (IMODIUM) 2 MG capsule Take 1 capsule (2 mg total) by mouth 4 (four) times daily as needed for diarrhea or loose stools. 06/12/16   Ward, Delice Bison, DO  methocarbamol (ROBAXIN) 500 MG tablet TAKE 1 TABLET (500 MG TOTAL) BY MOUTH AT BEDTIME. 03/12/19   Ria Bush, MD  metoprolol succinate (TOPROL-XL) 25 MG 24 hr tablet Take 0.5 tablets (12.5 mg total) by mouth daily. 12/04/18   Ria Bush, MD  Misc Natural Products [facility] ADVANCED) CAPS Take 1 capsule by mouth 2 (two) times daily.    [provider]  nitroGLYCERIN (NITROSTAT) 0.4  MG SL tablet Place 1 tablet (0.4 mg total) under the tongue every 5 (five) minutes as needed for  chest pain. 10/10/12   Lelon Perla, MD  nystatin (MYCOSTATIN/NYSTOP) powder Apply topically 2 (two) times daily. 08/15/18   Michel Bickers, MD  nystatin cream (MYCOSTATIN) Apply 1 application topically 2 (two) times daily. 02/05/19   Ria Bush, MD  omeprazole (PRILOSEC) 40 MG capsule TAKE 1 CAPSULE BY MOUTH EVERY DAY 12/12/18   Ria Bush, MD  ondansetron (ZOFRAN-ODT) 4 MG disintegrating tablet Take 1 tablet (4 mg total) by mouth every 8 (eight) hours as needed for nausea or vomiting. Day supply per insurance. 02/05/19   Ria Bush, MD  oxyCODONE (ROXICODONE) 15 MG immediate release tablet Take 1 tablet (15 mg total) by mouth every 4 (four) hours as needed. 02/27/19   Ria Bush, MD  polyethylene glycol powder [facility]) powder Take 17 g by mouth daily as needed for moderate constipation. 01/15/14   Ria Bush, MD  Red Yeast Rice 600 MG CAPS Take 2 capsules by mouth daily.    [provider]  vitamin B-12 (CYANOCOBALAMIN) 500 MCG tablet Take 1 tablet (500 mcg total) by mouth every Monday, Wednesday, and Friday. 12/27/18   Ria Bush, MD    Physical Exam: Vitals:   03/24/19 1400 03/24/19 1415 03/24/19 1430 03/24/19 1445  BP: 120/66 93/79 130/71 128/68  Pulse: 76   75  Resp: 17 (!) 31 18 15   Temp:      TempSrc:      SpO2: 95%   98%  Weight:      Height:        Constitutional: NAD, calm, comfortable Vitals:   03/24/19 1400 03/24/19 1415 03/24/19 1430 03/24/19 1445  BP: 120/66 93/79 130/71 128/68  Pulse: 76   75  Resp: 17 (!) 31 18 15   Temp:      TempSrc:      SpO2: 95%   98%  Weight:      Height:       Constitutional: Alert and oriented x3, not in acute distress, communicating well, on 2 L of oxygen via nasal cannula.   Eyes: PERRL, lids and conjunctivae normal ENMT: Mucous membranes are moist. Posterior pharynx clear of any exudate or lesions.Normal dentition.  Neck: normal, supple, no masses, no thyromegaly Respiratory:  clear to auscultation bilaterally, no wheezing, no crackles. Normal respiratory effort. No accessory muscle use.  Cardiovascular: Regular rate and rhythm, no murmurs / rubs / gallops.  Bilateral 2+ pitting edema positive.  2+ pedal pulses. No carotid bruits.  Abdomen: Abdominal distention noted.  Umbilicus everted, shifting dullness positive, no tenderness, no guarding, no rigidity, bowel sounds positive.   Musculoskeletal: no clubbing / cyanosis. No joint deformity upper and lower extremities. Good ROM, no contractures. Normal muscle tone.  Skin: no rashes, lesions, ulcers. No induration Neurologic: CN 2-12 grossly intact. Sensation intact, DTR normal. Strength 5/5 in all 4.  Psychiatric: Normal judgment and insight. Alert and oriented x 3. Normal mood.    Labs on Admission: I have personally reviewed following labs and imaging studies  CBC: Recent Labs  Lab 03/24/19 1230  WBC 4.1  NEUTROABS 2.8  HGB 11.1*  HCT 33.7*  MCV 101.8*  PLT 49*   Basic Metabolic Panel: Recent Labs  Lab 03/24/19 1230  NA 136  K 4.1  CL 104  CO2 26  GLUCOSE 99  BUN 11  CREATININE 0.77  CALCIUM 8.2*   GFR: Estimated Creatinine Clearance: 121.3 mL/min (by C-G  formula based on SCr of 0.77 mg/dL). Liver Function Tests: Recent Labs  Lab 03/24/19 1230  AST 25  ALT 13  ALKPHOS 138*  BILITOT 1.0  PROT 6.3*  ALBUMIN 2.2*   No results for input(s): LIPASE, AMYLASE in the last 168 hours. No results for input(s): AMMONIA in the last 168 hours. Coagulation Profile: Recent Labs  Lab 03/24/19 1230  INR 1.3*   Cardiac Enzymes: No results for input(s): CKTOTAL, CKMB, CKMBINDEX, TROPONINI in the last 168 hours. BNP (last 3 results) No results for input(s): PROBNP in the last 8760 hours. HbA1C: No results for input(s): HGBA1C in the last 72 hours. CBG: No results for input(s): GLUCAP in the last 168 hours. Lipid Profile: No results for input(s): CHOL, HDL, LDLCALC, TRIG, CHOLHDL, LDLDIRECT in  the last 72 hours. Thyroid Function Tests: No results for input(s): TSH, T4TOTAL, FREET4, T3FREE, THYROIDAB in the last 72 hours. Anemia Panel: No results for input(s): VITAMINB12, FOLATE, FERRITIN, TIBC, IRON, RETICCTPCT in the last 72 hours. Urine analysis:    Component Value Date/Time   COLORURINE AMBER (A) 08/27/2016 0156   APPEARANCEUR Cloudy (A) 03/02/2017 1411   LABSPEC 1.016 08/27/2016 0156   PHURINE 5.0 08/27/2016 0156   GLUCOSEU Trace (A) 03/02/2017 1411   HGBUR MODERATE (A) 08/27/2016 0156   BILIRUBINUR Negative 03/02/2017 1411   KETONESUR NEGATIVE 08/27/2016 0156   PROTEINUR 1+ (A) 03/02/2017 1411   PROTEINUR 30 (A) 08/27/2016 0156   UROBILINOGEN 0.2 05/19/2014 1109   UROBILINOGEN 1.0 03/01/2008 1803   NITRITE Negative 03/02/2017 1411   NITRITE NEGATIVE 08/27/2016 0156   LEUKOCYTESUR Trace (A) 03/02/2017 1411    Radiological Exams on Admission: Dg Chest Port 1 View  Result Date: 03/24/2019 CLINICAL DATA:  Shortness of breath.  Hypertension. EXAM: PORTABLE CHEST 1 VIEW COMPARISON:  November 30, 2015 FINDINGS: There is no edema or consolidation. Heart is borderline enlarged with pulmonary vascularity normal. No adenopathy. There is postoperative change in the lower cervical region. IMPRESSION: No edema or consolidation.  Borderline cardiac enlargement. Electronically Signed   By: Lowella Grip III M.D.   On: 03/24/2019 12:28    EKG: Normal sinus rhythm, no ST elevation or depression noted.  Assessment/Plan Principal Problem:   Cirrhosis of liver with ascites (HCC) Active Problems:   HLD (hyperlipidemia)   Severe obesity (BMI 35.0-39.9) with comorbidity ([place])   Smoker   Charcot-Marie-Tooth disease   Essential hypertension   CAD (coronary artery disease)   COPD (chronic obstructive pulmonary disease) (HCC)   Chronic pain syndrome   Abdominal aortic aneurysm (AAA) without rupture (HCC)   Thrombocytopenia (HCC)   Acute respiratory failure with hypoxemia (HCC)     Cirrhosis of liver with ascites: -2/2 NASH.  Patient presented with worsening abdominal distention and shortness of breath.  Less likely SBP as patient denies abdominal pain, fever, no leukocytosis. -EDP consulted IR for paracentesis-which will  likely be done tomorrow morning. -We will admit patient under observation. -Strict INO's and daily weight.  Continue Protonix and lactulose. -We will monitor his vitals.  We will keep him n.p.o. after midnight for possible procedure tomorrow a.m.  Acute hypoxic respiratory failure:  -Patient placed on 2 L of oxygen via nasal cannula -On continuous pulse ox.  He is not on oxygen at home. -We will try to wean off oxygen.  Chest x-ray: Shows borderline cardiac enlargement. -Reviewed echo from 5/20 which showed ejection fraction of 35 to 40% with grade 1 diastolic congestive heart failure.  BNP is elevated-not sure about  the baseline.  Hypertension: Stable -We will continue metoprolol.  COPD: Stable -No wheezing or cough symptoms. -Albuterol as needed for shortness of breath and wheezing.  Coronary artery disease status post stents: -No chest pain. - Continue nitro sublingual as needed, aspirin, metoprolol.  Thrombocytopenia: Likely secondary to cirrhosis of liver -No signs of bleeding.  Monitor platelet count closely.  Chronic pain syndrome: -Patient is on fentanyl patch and oxycodone and Cymbalta at home for chronic pain due to his muscular dystrophy -We will continue same.  Macrocytic anemia: -Secondary to B12 and folate deficiency -We will continue B12 and folate supplements.  History of abdominal aortic aneurysm: Aware  Morbid obesity: BMI of 38.  Tobacco abuse: Discussed about cessation.   DVT prophylaxis: TED/SCD-no Lovenox due to thrombocytopenia.   Code Status: Full code Family Communication: Wife e present at bedside.  Plan of care discussed with patient in and his wife length and they verbalized understanding and agreed  with it. Disposition Plan: Likely home in 1 to 2 days  consults called: IR for paracentesis by EDP Admission status: Observation  Mckinley Jewel MD [facility] Pager [phone]  If 7PM-7AM, please contact night-coverage [email or web address] Password [facility]  03/24/2019, 3:48 PM

## 2019-03-24 NOTE — Telephone Encounter (Signed)
Mardene Celeste nurse with [facility] is at pts home; 155 cm around; pt said abd is more swollen than usual. Pt has crackles at lungs and breathing difficulty and SOB. Pt does not weigh at home and Mardene Celeste wants to know if pt should go to ED for eval due to abd swelling and difficulty breathing. Pt had CP last week but no BP now. Mardene Celeste is not familiar with pt but she does feel pt needs further eval for swollen abd and difficulty breathing. Mardene Celeste will call 911 to take pt to [facility] ED now. FYI to Dr Danise Mina.

## 2019-03-24 NOTE — ED Notes (Signed)
Pt assisted standing at bedside with attempt to void. Pt still not able to void and becomes very shob with standing./ Assisted bac to reclined.

## 2019-03-24 NOTE — ED Provider Notes (Signed)
[name] Provider Note   CSN: [id number] Arrival date & time: 03/24/19  1130     History   Chief Complaint Chief Complaint  Patient presents with   Abdominal Pain   Shortness of Breath    HPI JANMICHAEL Ware is a 63 y.o. male with PMHx AAA, CAD s/p stents, Charcot Marie muscular atrophy, chronic pain syndrome, COPD, HTN, HLD, diabetes, liver cirrhosis s/2 NASH, who presents to the ED today complaining of gradual onset, worsening, abdominal distention and shortness of breath x 1-2 weeks.   Per chart review pt had a telemedicine visit with his PCP DR. Gutierrez on 10/16 for similar complaints - he was scheduled to have home health nurse come to his home today to obtain screening labs; when she got to the house the nurse was concerned given pt's appearance and complaints and called PCP's office regarding ED evaluation; recommended that she call EMS to transport pt to the ED.   Per triage report pt was placed on 2L [place]; he does not typically wear oxygen at home.   Pt is denying fevers/chills. He states his wife is  CNA and checks his temperature regularly and it has been normal. He denies any nausea or vomiting. Pt has never had to have a paracentesis in the past. He reports he has been out of his albuterol inhaler for > 6 months and has been wheezing at home since then. Pt does not weigh himself regularly and cannot say if he has gained any weight.   Pt does endorse that he had an episode of chest pain last week that resolved on its own. He does not currently have any chest pain.        Past Medical History:  Diagnosis Date   AAA (abdominal aortic aneurysm) ([place]) 09/2012--  monitored by dr Trula Slade   stable 5.6cm CTA abdomen 2016   Abnormal drug screen 07/09/2016   1/2/018 - positive oxycodone, fentanyl, inapprop positive MJ - mod risk   Allergic rhinitis    B12 deficiency    CAD (coronary artery disease) cardiologist-  dr Stanford Breed     x3 with stents last 2005, EF 40%, predominantly RCA by CT 2016   Cataracts, bilateral    Cervical spondylosis 05/2010   s/p surgery   Charcot Lelan Pons Tooth muscular atrophy dx  1975   neurologist--  dr love--  type 2 per pt   Chronic pain syndrome    [facility] with [facility] --> disagreement and transfered care to Dr Sanjuan Dame at [facility] pain clinic [facility], requests PCP write Rx but f/u with pain clinic Q6-12 months   COPD (chronic obstructive pulmonary disease) ([place]) 10/2011   minimal by PFTs   DDD (degenerative disc disease)    Disturbances of sensation of smell and taste    improving   Dyspnea on exertion    GERD (gastroesophageal reflux disease)    Gout    Headache    Hepatitis    hepatitis B   Hidradenitis    right groin   Hidradenitis suppurativa dx 2011   goin and leg crease   followd by Lyndle Herrlich - daily bactrim, s/p intralesional steroid injection 10/2010   Hip osteoarthritis    s/p intraarticular steroid shot (12/2012) ([place])   History of hepatitis B 1983   History of MI (myocardial infarction)    2000  &  2005   History of pneumonia    History of viral meningitis 2000   HLD (  hyperlipidemia)    HTN (hypertension)    Ischemic cardiomyopathy    s/p inferior MI  --  current ef per myoview 39%   Liver cirrhosis secondary to NASH ([place]) 01/2014   by CT scan, rec virtual colonoscopy by Dr Collene Mares 06/2014   Lumbar herniated disc    Myocardial infarction [facility])    x2   Nocturia more than twice per night    Obesity    Spinal stenosis    released from NSG.  established with [facility] (07/2013)   T2DM (type 2 diabetes mellitus) ([place])    ABIs WNL 2016   Vitamin D deficiency     Patient Active Problem List   Diagnosis Date Noted   Cough 03/21/2019   Acute pain of right knee 02/06/2019   Fall with injury 02/04/2019   Pancytopenia ([place]) 09/18/2018   General weakness 09/18/2018   Hearing loss 08/07/2018    Abdominal aortic aneurysm (AAA) without rupture ([place]) 01/02/2018   History of MRSA infection 01/02/2018   History of myocardial infarction 01/02/2018   Aortic atherosclerosis ([place]) 01/01/2018   MDD (major depressive disorder), recurrent severe, without psychosis ([place]) 11/17/2017   Candidal intertrigo 05/08/2017   Recurrent falls 03/13/2017   Abnormal drug screen 07/09/2016   Thrombocytopenia ([place]) 04/29/2016   Anemia 04/29/2016   Protein-calorie malnutrition ([place]) 04/29/2016   RUQ abdominal pain 04/26/2016   Encounter for chronic pain management 01/04/2016   Preop cardiovascular exam 09/01/2014   Chronic fatigue 07/22/2014   Esophageal dysphagia 07/22/2014   Gross hematuria 05/19/2014   Health maintenance examination 04/21/2014   Advanced care planning/counseling discussion 04/21/2014   Orthostatic hypotension 03/13/2014   Cirrhosis of liver without ascites ([place]) 01/03/2014   Right hip pain 06/24/2013   Osteoarthritis of right hip 08/16/2012   Medicare annual wellness visit, subsequent 06/07/2012   DOE (dyspnea on exertion) 10/06/2011   DDD (degenerative disc disease), cervical    Vitamin D deficiency 03/14/2011   Vitamin B12 deficiency 08/05/2010   Prediabetes 06/27/2010   OSA (obstructive sleep apnea) 05/16/2010   Cervical spondylosis 05/05/2010   Severe obesity (BMI 35.0-39.9) with comorbidity ([place]) 05/03/2010   Hidradenitis 05/03/2010   Smoker 10/21/2009   HLD (hyperlipidemia) 09/07/2009   Gout 09/07/2009   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   Essential hypertension 09/07/2009   CAD (coronary artery disease) 09/07/2009   CARDIOMYOPATHY 09/07/2009   COPD (chronic obstructive pulmonary disease) ([place]) 09/07/2009   Chronic pain syndrome 09/07/2009    Past Surgical History:  Procedure Laterality Date   ABDOMINAL AORTIC ENDOVASCULAR FENESTRATED STENT GRAFT N/A 11/30/2015   Procedure: ABDOMINAL AORTIC ENDOVASCULAR  FENESTRATED STENT GRAFT;  Surgeon: Serafina Mitchell, MD;  Location: [place];  Service: Vascular;  Laterality: N/A;   ANTERIOR CERVICAL DECOMP/DISCECTOMY FUSION  01-07-2010    C4 -- C7   CARDIAC CATHETERIZATION  03-30-2005  dr Albertine Patricia   ef 40% w/ inferior akinesis/  LM and CFX angiographically normal/  pLAD 30%/   Widely patent stents in RCA and PDA widely patent   CARDIOVASCULAR STRESS TEST  10-23-2012  dr Stanford Breed   No ischemia/  Moderate scar in the inferior wall, otherwise normal perfusion/  LV ef 39%,  LV wall motion: inferior/ inferolateral hypokinesis   COLONOSCOPY  05/06/2007   normal, small int hemorrhoids rpt 5 yrs due to fmhx - rec against rpt colonoscopy by Dr Collene Mares   CORONARY ANGIOPLASTY  2000  dr Stanford Breed   PCI to RCA and [place]  03-19-2005  dr Sabino Snipes   inferior STEMI--- DES x4 to RCA w/ balloon angioplasty and balloon angioplasty to jailed PDA ostium/  severe hypokinesis of midinferor wall, ef 50%/  dLM 20%,  mLAD 20%,  dCFX 60%   ESOPHAGOGASTRODUODENOSCOPY  01/2017   dilated benign esophageal stenosis, portal hypertensive gastropathy Henrene Pastor)   ESOPHAGOGASTRODUODENOSCOPY (EGD) WITH PROPOFOL N/A 02/20/2018   benign biopsy [name], MD)   HYDRADENITIS EXCISION Right 12/31/2014   Procedure: WIDE EXCISION HIDRADENITIS GROIN; Coralie Keens, MD   LUMBAR [place] SURGERY     L5-S1   LUMBAR LAMINECTOMY  05-18-2010   L2--5   laminectomy/foraminotomy for stenosis Joya Salm)   MYELOGRAM     L5-S1 and L1-2 spondylosis   SACROILIAC JOINT INJECTION Bilateral 10/2013   [place]   TONSILLECTOMY AND ADENOIDECTOMY  1972        Home Medications    Prior to Admission medications   Medication Sig Start Date End Date Taking? Authorizing Provider  albuterol (PROVENTIL HFA;VENTOLIN HFA) 108 (90 Base) MCG/ACT inhaler Inhale 2 puffs into the lungs every 6 (six) hours as needed for wheezing or shortness of breath. 08/17/15   Jearld Fenton,  NP  albuterol (PROVENTIL) (2.5 MG/3ML) 0.083% nebulizer solution USE 1 VIAL PER NEBULIZER EVERY 6 HRS AS NEEDED FOR WHEEZING 07/19/15   Ria Bush, MD  aspirin 81 MG tablet Take 1 tablet (81 mg total) by mouth daily. 10/18/18   Lelon Perla, MD  B Complex-C (SUPER B COMPLEX PO) Take 1 tablet by mouth daily.    [provider]  Ca Phosphate-Cholecalciferol [phone] MG-UNIT TABS Take by mouth. 04/29/16   [provider]  cholecalciferol 2000 units TABS Take 2,000 Units by mouth daily. 04/29/16   Ria Bush, MD  Cobalamin Combinations (B-12) 1000-400 MCG SUBL Take by mouth.    [provider]  dicyclomine (BENTYL) 20 MG tablet TAKE 1 TABLET BY MOUTH 3 TIMES DAILY BEFORE MEALS. AS NEEDED FOR ABDOMINAL PAIN 06/24/18   Ria Bush, MD  DULoxetine (CYMBALTA) 60 MG capsule Take 1 capsule (60 mg total) by mouth daily. 12/04/18   Ria Bush, MD  fentaNYL (DURAGESIC - DOSED MCG/HR) 50 MCG/HR Place 1 patch (50 mcg total) every other day onto the skin. 04/19/17   Ria Bush, MD  fluticasone [facility]) 50 MCG/ACT nasal spray Place 2 sprays into both nostrils daily as needed for allergies.  02/09/12   Ria Bush, MD  folic acid (FOLVITE) 1 MG tablet Take 1 tablet (1 mg total) by mouth daily. 12/26/18   Ria Bush, MD  lactulose (CEPHULAC) 20 g packet Take 1 packet (20 g total) by mouth 3 (three) times daily. 04/12/18   [name], MD  loperamide (IMODIUM) 2 MG capsule Take 1 capsule (2 mg total) by mouth 4 (four) times daily as needed for diarrhea or loose stools. 06/12/16   Ward, Delice Bison, DO  methocarbamol (ROBAXIN) 500 MG tablet TAKE 1 TABLET (500 MG TOTAL) BY MOUTH AT BEDTIME. 03/12/19   Ria Bush, MD  metoprolol succinate (TOPROL-XL) 25 MG 24 hr tablet Take 0.5 tablets (12.5 mg total) by mouth daily. 12/04/18   Ria Bush, MD  Misc Natural Products [facility] ADVANCED) CAPS Take 1 capsule by mouth 2 (two) times daily.     [provider]  nitroGLYCERIN (NITROSTAT) 0.4 MG SL tablet Place 1 tablet (0.4 mg total) under the tongue every 5 (five) minutes as needed for chest pain. 10/10/12   Lelon Perla, MD  nystatin (MYCOSTATIN/NYSTOP) powder Apply topically 2 (  two) times daily. 08/15/18   Michel Bickers, MD  nystatin cream (MYCOSTATIN) Apply 1 application topically 2 (two) times daily. 02/05/19   Ria Bush, MD  omeprazole (PRILOSEC) 40 MG capsule TAKE 1 CAPSULE BY MOUTH EVERY DAY 12/12/18   Ria Bush, MD  ondansetron (ZOFRAN-ODT) 4 MG disintegrating tablet Take 1 tablet (4 mg total) by mouth every 8 (eight) hours as needed for nausea or vomiting. Day supply per insurance. 02/05/19   Ria Bush, MD  oxyCODONE (ROXICODONE) 15 MG immediate release tablet Take 1 tablet (15 mg total) by mouth every 4 (four) hours as needed. 02/27/19   Ria Bush, MD  polyethylene glycol powder [facility]) powder Take 17 g by mouth daily as needed for moderate constipation. 01/15/14   Ria Bush, MD  Red Yeast Rice 600 MG CAPS Take 2 capsules by mouth daily.    [provider]  vitamin B-12 (CYANOCOBALAMIN) 500 MCG tablet Take 1 tablet (500 mcg total) by mouth every Monday, Wednesday, and Friday. 12/27/18   Ria Bush, MD    Family History Family History  Problem Relation Age of Onset   Cancer Mother        colon   Diabetes Mother    Kidney disease Mother    Aneurysm Mother        AAA   Rheum arthritis Mother    Charcot-Marie-Tooth disease Mother    Heart disease Mother        before age 32   Cancer Father        skin   Heart attack Father    Heart disease Father        before age 38   Cancer Brother        skin   Coronary artery disease Brother    Cancer Brother        small cell lung cancer   Aneurysm Brother        AAA   Rheum arthritis Sister    Rheum arthritis Brother    Prostate cancer Neg Hx    Bladder Cancer Neg Hx    Kidney cancer  Neg Hx     Social History Social History   Tobacco Use   Smoking status: Current Some Day Smoker    Packs/day: 1.00    Years: 57.00    Pack years: 57.00    Types: Cigarettes, E-cigarettes    Start date: 06/06/1967    Last attempt to quit: 02/20/2018    Years since quitting: 1.0   Smokeless tobacco: Never Used   Tobacco comment: stopped smoking a pipe in 2015  DOES SMOKE E CIG  Substance Use Topics   Alcohol use: No    Alcohol/week: 0.0 standard drinks   Drug use: Yes    Types: Fentanyl, Hydrocodone     Allergies   Statins, Losartan, Penicillins, Tramadol, Allopurinol, Baclofen, and Gabapentin   Review of Systems Review of Systems  Constitutional: Negative for chills and fever.  HENT: Negative for congestion.   Eyes: Negative for visual disturbance.  Respiratory: Positive for shortness of breath. Negative for cough.   Cardiovascular: Positive for chest pain (last week).  Gastrointestinal: Positive for abdominal distention. Negative for constipation, diarrhea, nausea and vomiting.  Genitourinary: Negative for difficulty urinating.  Musculoskeletal: Negative for myalgias.  Skin: Negative for rash.  Neurological: Negative for dizziness, light-headedness and headaches.     Physical Exam Updated Vital Signs BP 131/77 (BP Location: Right Arm)    Pulse 68    Temp 98.2 F (36.8  C) (Oral)    Resp 16    Ht 5' 10"  (1.778 m)    Wt 120.2 kg    SpO2 99%    BMI 38.02 kg/m   Physical Exam Vitals signs and nursing note reviewed.  Constitutional:      Appearance: He is obese. He is not ill-appearing or diaphoretic.  HENT:     Head: Normocephalic and atraumatic.  Eyes:     Conjunctiva/sclera: Conjunctivae normal.  Neck:     Musculoskeletal: Neck supple.  Cardiovascular:     Rate and Rhythm: Normal rate and regular rhythm.     Heart sounds: Normal heart sounds.  Pulmonary:     Breath sounds: Wheezing present. No rhonchi or rales.     Comments: On 2L [place] satting 95% on RA.  Audibly wheezing in room.  Abdominal:     General: There is distension.     Palpations: Abdomen is soft.     Tenderness: There is generalized abdominal tenderness. There is no guarding or rebound.  Musculoskeletal:     Comments: Nonpitting edema bilaterally   Skin:    General: Skin is warm and dry.  Neurological:     Mental Status: He is alert.      ED Treatments / Results  Labs (all labs ordered are listed, but only abnormal results are displayed) Labs Reviewed  COMPREHENSIVE METABOLIC PANEL - Abnormal; Notable for the following components:      Result Value   Calcium 8.2 (*)    Total Protein 6.3 (*)    Albumin 2.2 (*)    Alkaline Phosphatase 138 (*)    All other components within normal limits  CBC WITH DIFFERENTIAL/PLATELET - Abnormal; Notable for the following components:   RBC 3.31 (*)    Hemoglobin 11.1 (*)    HCT 33.7 (*)    MCV 101.8 (*)    Platelets 49 (*)    All other components within normal limits  PROTIME-INR - Abnormal; Notable for the following components:   Prothrombin Time 16.1 (*)    INR 1.3 (*)    All other components within normal limits  BRAIN NATRIURETIC PEPTIDE - Abnormal; Notable for the following components:   B Natriuretic Peptide 140.1 (*)    All other components within normal limits  SARS CORONAVIRUS 2 (TAT 6-24 HRS)  LACTIC ACID, PLASMA  URINALYSIS, ROUTINE W REFLEX MICROSCOPIC  LACTIC ACID, PLASMA  TROPONIN I (HIGH SENSITIVITY)  TROPONIN I (HIGH SENSITIVITY)    EKG EKG Interpretation  Date/Time:  Monday March 24 2019 11:41:29 EDT Ventricular Rate:  69 PR Interval:    QRS Duration: 114 QT Interval:  405 QTC Calculation: 434 R Axis:   70 Text Interpretation:  Sinus rhythm Borderline intraventricular conduction delay Borderline repolarization abnormality No significant change since last tracing Confirmed by Dorie Rank [phone]) on 03/24/2019 12:00:38 PM   Radiology Dg Chest Port 1 View  Result Date: 03/24/2019 CLINICAL DATA:   Shortness of breath.  Hypertension. EXAM: PORTABLE CHEST 1 VIEW COMPARISON:  November 30, 2015 FINDINGS: There is no edema or consolidation. Heart is borderline enlarged with pulmonary vascularity normal. No adenopathy. There is postoperative change in the lower cervical region. IMPRESSION: No edema or consolidation.  Borderline cardiac enlargement. Electronically Signed   By: Lowella Grip III M.D.   On: 03/24/2019 12:28    Procedures Procedures (including critical care time)  Medications Ordered in ED Medications  oxyCODONE (Oxy IR/ROXICODONE) immediate release tablet 15 mg (has no administration in time range)  albuterol (  VENTOLIN HFA) 108 (90 Base) MCG/ACT inhaler 2 puff (2 puffs Inhalation Given 03/24/19 1408)     Initial Impression / Assessment and Plan / ED Course  I have reviewed the triage vital signs and the nursing notes.  Pertinent labs & imaging results that were available during my care of the patient were reviewed by me and considered in my medical decision making (see chart for details).      63 year old Male presents to the ED with worsening abdominal distention and shortness of breath for the past 1 to 2 weeks.  With history of cirrhosis secondary to [place].  He has never had to have a paracentesis done before.  When he arrived with EMS he arrived on 2 L nasal cannula.  Patient is not on any oxygen at home.  Attempted to wean off and patient dipped to 94%; back on 2 L.  Will obtain baseline screening labs today.  We will also obtain chest x-ray, BNP, troponin.  Most recent EF of 35 to 40%.  Does not appear that patient is on a fluid pill at home.  Denies any abdominal pain.  He states that it is more uncomfortable because it feels tight.  Patient was actively wheezing on exam.  He reports he is supposed to have an inhaler but ran out of it approximately 6 months ago.  Will give inhaler in the ED and reevaluate.  Would like to rule out congestive heart failure exacerbation at this  time vs SOB due to ascites.   CBC unremarkable.  No electrolyte abnormalities on CMP.  Creatinine within normal limits.  No elevation in LFTs.  X-ray without signs of vascular pulmonary congestion.  Initial troponin of 6.  BNP 140, do not have baseline to compare to but given no findings on chest x-ray feel that patient's shortness of breath is likely secondary to ascites.   Consulted interventional radiology for paracentesis as I feel this would be therapeutic for patient.  His lactic acid is normal and again he does not have a white blood cell count.  I have low suspicion for SBP today.  Does not appear that they will be able to get the patient until tomorrow.  They suggested either admission versus outpatient follow-up in the morning.  Upon reevaluation I have attempted to wean patient off of the oxygen again but dips down to 92%.  Feel that he will need to be admitted at this time with paracentesis scheduled for the morning.  He is in agreement with plan at this time. Will consult hospitalist for admission.   Discussed case with Dr. Doristine Bosworth who agrees to accept patient for admission.   This note was prepared using Dragon voice recognition software and may include unintentional dictation errors due to the inherent limitations of voice recognition software.      Final Clinical Impressions(s) / ED Diagnoses   Final diagnoses:  Abdominal distension  Other ascites  Other cirrhosis of liver [facility])  Requires supplemental oxygen    ED Discharge Orders    [name]       Eustaquio Maize, PA-C 03/24/19 1534    Dorie Rank, MD 03/25/19 [phone]

## 2019-03-24 NOTE — Telephone Encounter (Signed)
Noted. Thank you. Plan was for labs at home. However will await ER eval.

## 2019-03-25 ENCOUNTER — Telehealth: Payer: Self-pay | Admitting: Gastroenterology

## 2019-03-25 ENCOUNTER — Observation Stay (HOSPITAL_COMMUNITY): Payer: Medicare PPO

## 2019-03-25 ENCOUNTER — Encounter (HOSPITAL_COMMUNITY): Payer: Self-pay | Admitting: Physician Assistant

## 2019-03-25 DIAGNOSIS — R140 Abdominal distension (gaseous): Secondary | ICD-10-CM | POA: Diagnosis not present

## 2019-03-25 DIAGNOSIS — K7460 Unspecified cirrhosis of liver: Secondary | ICD-10-CM | POA: Diagnosis not present

## 2019-03-25 DIAGNOSIS — R188 Other ascites: Secondary | ICD-10-CM | POA: Diagnosis not present

## 2019-03-25 DIAGNOSIS — J9601 Acute respiratory failure with hypoxia: Secondary | ICD-10-CM | POA: Diagnosis not present

## 2019-03-25 DIAGNOSIS — I714 Abdominal aortic aneurysm, without rupture: Secondary | ICD-10-CM | POA: Diagnosis not present

## 2019-03-25 HISTORY — PX: IR PARACENTESIS: IMG2679

## 2019-03-25 LAB — BODY FLUID CELL COUNT WITH DIFFERENTIAL
Lymphs, Fluid: 35 %
Monocyte-Macrophage-Serous Fluid: 61 % (ref 50–90)
Neutrophil Count, Fluid: 4 % (ref 0–25)
Total Nucleated Cell Count, Fluid: 77 cu mm (ref 0–1000)

## 2019-03-25 LAB — CBC
HCT: 32 % — ABNORMAL LOW (ref 39.0–52.0)
Hemoglobin: 10.1 g/dL — ABNORMAL LOW (ref 13.0–17.0)
MCH: 32.5 pg (ref 26.0–34.0)
MCHC: 31.6 g/dL (ref 30.0–36.0)
MCV: 102.9 fL — ABNORMAL HIGH (ref 80.0–100.0)
Platelets: 49 10*3/uL — ABNORMAL LOW (ref 150–400)
RBC: 3.11 MIL/uL — ABNORMAL LOW (ref 4.22–5.81)
RDW: 15.3 % (ref 11.5–15.5)
WBC: 3.4 10*3/uL — ABNORMAL LOW (ref 4.0–10.5)
nRBC: 0 % (ref 0.0–0.2)

## 2019-03-25 LAB — FOLATE RBC
Folate, Hemolysate: 381 ng/mL
Folate, RBC: 1162 ng/mL (ref >498)
Hematocrit: 32.8 % — ABNORMAL LOW (ref 37.5–51.0)

## 2019-03-25 LAB — BASIC METABOLIC PANEL
Anion gap: 7 (ref 5–15)
BUN: 12 mg/dL (ref 8–23)
CO2: 26 mmol/L (ref 22–32)
Calcium: 8.1 mg/dL — ABNORMAL LOW (ref 8.9–10.3)
Chloride: 106 mmol/L (ref 98–111)
Creatinine, Ser: 0.73 mg/dL (ref 0.61–1.24)
GFR calc Af Amer: >60 mL/min (ref >60)
GFR calc non Af Amer: >60 mL/min (ref >60)
Glucose, Bld: 99 mg/dL (ref 70–99)
Potassium: 4.1 mmol/L (ref 3.5–5.1)
Sodium: 139 mmol/L (ref 135–145)

## 2019-03-25 LAB — GRAM STAIN

## 2019-03-25 LAB — GLUCOSE, PLEURAL OR PERITONEAL FLUID: Glucose, Fluid: 107 mg/dL

## 2019-03-25 LAB — PROTIME-INR
INR: 1.4 — ABNORMAL HIGH (ref 0.8–1.2)
Prothrombin Time: 17.3 seconds — ABNORMAL HIGH (ref 11.4–15.2)

## 2019-03-25 LAB — APTT: aPTT: 36 seconds (ref 24–36)

## 2019-03-25 MED ORDER — SPIRONOLACTONE 25 MG PO TABS
100.0000 mg | ORAL_TABLET | Freq: Every day | ORAL | Status: DC
  Administered 2019-03-25 – 2019-03-26 (×2): 100 mg via ORAL
  Filled 2019-03-25 (×2): qty 4

## 2019-03-25 MED ORDER — LIDOCAINE HCL 1 % IJ SOLN
INTRAMUSCULAR | Status: DC | PRN
  Administered 2019-03-25: 10 mL

## 2019-03-25 MED ORDER — PNEUMOCOCCAL VAC POLYVALENT 25 MCG/0.5ML IJ INJ
0.5000 mL | INJECTION | INTRAMUSCULAR | Status: DC
  Filled 2019-03-25: qty 0.5

## 2019-03-25 MED ORDER — INFLUENZA VAC SPLIT QUAD 0.5 ML IM SUSY
0.5000 mL | PREFILLED_SYRINGE | INTRAMUSCULAR | Status: AC
  Administered 2019-03-26: 0.5 mL via INTRAMUSCULAR
  Filled 2019-03-25: qty 0.5

## 2019-03-25 MED ORDER — FUROSEMIDE 40 MG PO TABS
40.0000 mg | ORAL_TABLET | Freq: Every day | ORAL | Status: DC
  Administered 2019-03-25 – 2019-03-26 (×2): 40 mg via ORAL
  Filled 2019-03-25 (×2): qty 1

## 2019-03-25 MED ORDER — NICOTINE 14 MG/24HR TD PT24
14.0000 mg | MEDICATED_PATCH | Freq: Every day | TRANSDERMAL | Status: DC
  Administered 2019-03-25 – 2019-03-26 (×2): 14 mg via TRANSDERMAL
  Filled 2019-03-25 (×2): qty 1

## 2019-03-25 MED ORDER — LIDOCAINE HCL 1 % IJ SOLN
INTRAMUSCULAR | Status: AC
  Filled 2019-03-25: qty 20

## 2019-03-25 NOTE — Progress Notes (Signed)
[facility] PROGRESS NOTE  [name] YFV:[id number] DOB: Aug 18, 1955 DOA: 03/24/2019 PCP: Ria Bush, MD  Assessment/Plan:  Cirrhosis of liver with ascites: 2/2 NASH.  Patient presented with worsening abdominal distention and shortness of breath. S/p paracentesis 10/20 with 5.2L removed. Labs ordered. BP stable. He is afebrile and non-toxic appearing.  -will add lasix and aldactone to home meds -Strict INO's and daily weight. - Continue Protonix and lactulose. -he has apt with primary GI Dr Vicente Males 10/22  Acute hypoxic respiratory failure: resolved this am. He is not on oxygen at home. Chest x-ray: Shows borderline cardiac enlargement. Echo from 5/20 revealed ejection fraction of 35 to 40% with grade 1 diastolic congestive heart failure.  BNP is elevated.  -monitor  -lasix as noted above -continue home nebs  Hypertension: Stable -We will continue metoprolol. -monitor closely in setting of paracentesis and adding diuretics  COPD: Stable -Albuterol as needed for shortness of breath and wheezing.  Coronary artery disease status post stents: No chest pain. - Continue nitro sublingual as needed, aspirin, metoprolol.  Thrombocytopenia: Likely secondary to cirrhosis of liver. Current level close to baseline. No signs of bleeding. - Monitor platelet count closely.  Chronic pain syndrome: appears stable at baseline. Patient is on fentanyl patch and oxycodone and Cymbalta at home for chronic pain due to his muscular dystrophy -We will continue same.  Macrocytic anemia: Secondary to B12 and folate deficiency. Hg 10.1 which appears to be close to baseline -We will continue B12 and folate supplements.  History of abdominal aortic aneurysm: Aware  Morbid obesity: BMI of 38.  Tobacco abuse: Discussed about cessation.    Code Status: full Family Communication: wife Disposition Plan: home hopefully  tomorrow   Consultants:  IR  Procedures:  Paracentesis 03/25/19  Antibiotics:    HPI/Subjective: Awake alert. Denies pain/discomfort.   Objective: Vitals:   03/25/19 0334 03/25/19 1148  BP: (!) 101/51 (!) 120/50  Pulse: 73 63  Resp: 20   Temp: 98.4 F (36.9 C) 98.4 F (36.9 C)  SpO2: 94% 94%    Intake/Output Summary (Last 24 hours) at 03/25/2019 1214 Last data filed at 03/25/2019 1157 Gross per 24 hour  Intake 700 ml  Output 400 ml  Net 300 ml   Filed Weights   03/24/19 1140  Weight: 120.2 kg    Exam:   General:  Awake alert obese no acute distress  Cardiovascular: rrr no mgr trace LE edema  Respiratory: normal effort BS distant but clear  Abdomen: obese slightly firm +BS no guarding or rebounding, +wave  Musculoskeletal: joints without swelling/erythema   Data Reviewed: Basic Metabolic Panel: Recent Labs  Lab 03/24/19 1230 03/24/19 1805 03/25/19 0622  NA 136  --  139  K 4.1  --  4.1  CL 104  --  106  CO2 26  --  26  GLUCOSE 99  --  99  BUN 11  --  12  CREATININE 0.77  --  0.73  CALCIUM 8.2*  --  8.1*  MG  --  1.8  --   PHOS  --  3.1  --    Liver Function Tests: Recent Labs  Lab 03/24/19 1230  AST 25  ALT 13  ALKPHOS 138*  BILITOT 1.0  PROT 6.3*  ALBUMIN 2.2*   No results for input(s): LIPASE, AMYLASE in the last 168 hours. No results for input(s): AMMONIA in the last 168 hours. CBC: Recent Labs  Lab 03/24/19 1230 03/25/19 0622  WBC 4.1 3.4*  NEUTROABS 2.8  --  HGB 11.1* 10.1*  HCT 33.7* 32.0*  MCV 101.8* 102.9*  PLT 49* 49*   Cardiac Enzymes: No results for input(s): CKTOTAL, CKMB, CKMBINDEX, TROPONINI in the last 168 hours. BNP (last 3 results) Recent Labs    03/24/19 1230  BNP 140.1*    ProBNP (last 3 results) No results for input(s): PROBNP in the last 8760 hours.  CBG: No results for input(s): GLUCAP in the last 168 hours.  Recent Results (from the past 240 hour(s))  SARS CORONAVIRUS 2 (TAT 6-24  HRS) Nasopharyngeal Nasopharyngeal Swab     Status: None   Collection Time: 03/24/19  3:16 PM   Specimen: Nasopharyngeal Swab  Result Value Ref Range Status   SARS Coronavirus 2 NEGATIVE NEGATIVE Final    Comment: (NOTE) SARS-CoV-2 target nucleic acids are NOT DETECTED. The SARS-CoV-2 RNA is generally detectable in upper and lower respiratory specimens during the acute phase of infection. Negative results do not preclude SARS-CoV-2 infection, do not rule out co-infections with other pathogens, and should not be used as the sole basis for treatment or other patient management decisions. Negative results must be combined with clinical observations, patient history, and epidemiological information. The expected result is Negative. Fact Sheet for Patients: SugarRoll.be Fact Sheet for Healthcare Providers: https://www.woods-mathews.com/ This test is not yet approved or cleared by the [place] FDA and  has been authorized for detection and/or diagnosis of SARS-CoV-2 by FDA under an Emergency Use Authorization (EUA). This EUA will remain  in effect (meaning this test can be used) for the duration of the COVID-19 declaration under Section 56 4(b)(1) of the Act, 21 U.S.C. section 360bbb-3(b)(1), unless the authorization is terminated or revoked sooner. Performed at [facility], [place] [address], [place], [place] [address]      Studies: Dg Chest Port 1 View  Result Date: 03/24/2019 CLINICAL DATA:  Shortness of breath.  Hypertension. EXAM: PORTABLE CHEST 1 VIEW COMPARISON:  November 30, 2015 FINDINGS: There is no edema or consolidation. Heart is borderline enlarged with pulmonary vascularity normal. No adenopathy. There is postoperative change in the lower cervical region. IMPRESSION: No edema or consolidation.  Borderline cardiac enlargement. Electronically Signed   By: Lowella Grip III M.D.   On: 03/24/2019 12:28   Ir  Paracentesis  Result Date: 03/25/2019 INDICATION: Patient with history of NASH cirrhosis, new onset abdominal distension. Request to IR for diagnostic and therapeutic paracentesis. EXAM: ULTRASOUND GUIDED DIAGNOSTIC AND THERAPEUTIC PARACENTESIS MEDICATIONS: 10 mL 1% lidocaine COMPLICATIONS: None immediate. PROCEDURE: Informed written consent was obtained from the patient after a discussion of the risks, benefits and alternatives to treatment. A timeout was performed prior to the initiation of the procedure. Initial ultrasound scanning demonstrates a large amount of ascites within the right lower abdominal quadrant. The right lower abdomen was prepped and draped in the usual sterile fashion. 1% lidocaine was used for local anesthesia. Following this, a 19 gauge, 10 cm, Yueh catheter was introduced. An ultrasound image was saved for documentation purposes. The paracentesis was performed. The catheter was removed and a dressing was applied. The patient tolerated the procedure well without immediate post procedural complication. FINDINGS: A total of approximately 5.2 L of clear yellow fluid was removed. Samples were sent to the laboratory as requested by the clinical team. IMPRESSION: Successful ultrasound-guided paracentesis yielding 5.2 liters of peritoneal fluid. Read by Candiss Norse, PA-C Electronically Signed   By: Aletta Edouard M.D.   On: 03/25/2019 10:28    Scheduled Meds: . aspirin  81 mg  Oral Daily  . dicyclomine  20 mg Oral TID AC & HS  . DULoxetine  60 mg Oral Daily  . fentaNYL  1 patch Transdermal Q48H  . folic acid  1 mg Oral Daily  . furosemide  40 mg Oral Daily  . lactulose  20 g Oral TID  . metoprolol succinate  12.5 mg Oral Daily  . nicotine  14 mg Transdermal Daily  . pantoprazole  40 mg Oral Daily  . spironolactone  100 mg Oral Daily  . vitamin B-12  500 mcg Oral Q M,W,F   Continuous Infusions:  Principal Problem:   Cirrhosis of liver with ascites (HCC) Active  Problems:   Acute respiratory failure with hypoxemia (HCC)   Severe obesity (BMI 35.0-39.9) with comorbidity ([place])   Essential hypertension   CAD (coronary artery disease)   COPD (chronic obstructive pulmonary disease) (HCC)   Thrombocytopenia (HCC)   HLD (hyperlipidemia)   Smoker   Charcot-Marie-Tooth disease   Chronic pain syndrome   Abdominal aortic aneurysm (AAA) without rupture ([place])    Time spent: 72 minutes    [facility] M NP  [facility]  If 7PM-7AM, please contact night-coverage at [email or web address], password [facility] 03/25/2019, 12:14 PM  LOS: 0 days

## 2019-03-25 NOTE — Plan of Care (Signed)
  Problem: Education: Goal: Knowledge of General Education information will improve Description: Including pain rating scale, medication(s)/side effects and non-pharmacologic comfort measures Outcome: Progressing   Problem: Clinical Measurements: Goal: Respiratory complications will improve Outcome: Progressing   

## 2019-03-25 NOTE — Evaluation (Signed)
Physical Therapy Evaluation Patient Details Name: [name] MRN: [id number] DOB: 11-Apr-1956 Today's Date: 03/25/2019   History of Present Illness  Pt is a 63 y/o male admitted secondary to increased SOB. Found to have cirrhosis and new onset ascites. Pt is s/p paracentesis. PMH includes HTN, CAD s/p stent placemetn, COPD, AAA, chronic pain, Charcot Marie Tooth muscular atrophy, and tobacco abuse.   Clinical Impression  Pt admitted secondary to problem above with deficits below. Pt requiring min guard to stand at EOB, however, complained of dizziness, therefore further mobility deferred. Pt reports he uses WC at baseline, but was independent with transfers. Anticipate pt will progress well once feeling better. Will continue to follow acutely to maximize functional mobility independence and safety.     Follow Up Recommendations Home health PT;Supervision for mobility/OOB    Equipment Recommendations  None recommended by PT    Recommendations for Other Services       Precautions / Restrictions Precautions Precautions: Fall Restrictions Weight Bearing Restrictions: No      Mobility  Bed Mobility Overal bed mobility: Modified Independent             General bed mobility comments: Increased time to come to EOB. Mild dizziness reported that subsided with seated rest.   Transfers Overall transfer level: Needs assistance Equipment used: Rolling walker (2 wheeled) Transfers: Sit to/from Stand Sit to Stand: Min guard         General transfer comment: Min guard for steadying assist. Cues for safe hand placement. Pt able to pull up underwear with min guard A. Requesting to return to sitting secondary to fatigue and mild dizziness.   Ambulation/Gait                Stairs            Wheelchair Mobility    Modified Rankin (Stroke Patients Only)       Balance Overall balance assessment: Needs assistance Sitting-balance support: No upper extremity  supported;Feet supported Sitting balance-Leahy Scale: Good     Standing balance support: Bilateral upper extremity supported;During functional activity Standing balance-Leahy Scale: Poor Standing balance comment: Reliant on UE support                              Pertinent Vitals/Pain Pain Assessment: No/denies pain    Home Living Family/patient expects to be discharged to:: Private residence Living Arrangements: Spouse/significant other Available Help at Discharge: Family;Available 24 hours/day Type of Home: House Home Access: Ramped entrance     Home Layout: One level Home Equipment: Youth worker - 4 wheels;Bedside commode      Prior Function Level of Independence: Needs assistance   Gait / Transfers Assistance Needed: Pt reports he uses WC for mobility, but is able to transfer independently.   ADL's / Homemaking Assistance Needed: Pt reports he sponge bathes because he is unable to ambulate to bathroom and his WC does not fit down the hall. Wife assists with ADL tasks as needed.         Hand Dominance        Extremity/Trunk Assessment   Upper Extremity Assessment Upper Extremity Assessment: Overall WFL for tasks assessed    Lower Extremity Assessment Lower Extremity Assessment: Generalized weakness(Pt reports weakness at baseline )    Cervical / Trunk Assessment Cervical / Trunk Assessment: Normal  Communication   Communication: No difficulties  Cognition Arousal/Alertness: Awake/alert Behavior During Therapy: WFL for tasks assessed/performed Overall  Cognitive Status: Within Functional Limits for tasks assessed                                        General Comments      Exercises     Assessment/Plan    PT Assessment Patient needs continued PT services  PT Problem List Decreased strength;Decreased balance;Decreased mobility;Decreased activity tolerance;Decreased knowledge of use of DME       PT Treatment  Interventions DME instruction;Functional mobility training;Therapeutic activities;Therapeutic exercise;Balance training;Patient/family education;Wheelchair mobility training    PT Goals (Current goals can be found in the Care Plan section)  Acute Rehab PT Goals Patient Stated Goal: to go home PT Goal Formulation: With patient Time For Goal Achievement: 04/08/19 Potential to Achieve Goals: Good    Frequency Min 3X/week   Barriers to discharge        Co-evaluation               AM-PAC PT "6 Clicks" Mobility  Outcome Measure Help needed turning from your back to your side while in a flat bed without using bedrails?: None Help needed moving from lying on your back to sitting on the side of a flat bed without using bedrails?: None Help needed moving to and from a bed to a chair (including a wheelchair)?: A Little Help needed standing up from a chair using your arms (e.g., wheelchair or bedside chair)?: A Little Help needed to walk in hospital room?: A Lot Help needed climbing 3-5 steps with a railing? : Total 6 Click Score: 17    End of Session Equipment Utilized During Treatment: Gait belt Activity Tolerance: Patient limited by fatigue Patient left: in bed;with call bell/phone within reach Nurse Communication: Mobility status PT Visit Diagnosis: Muscle weakness (generalized) (M62.81);Unsteadiness on feet (R26.81);Difficulty in walking, not elsewhere classified (R26.2)    Time: 1419-1440 PT Time Calculation (min) (ACUTE ONLY): 21 min   Charges:   PT Evaluation $PT Eval Moderate Complexity: [place], PT, DPT  Acute Rehabilitation Services  Pager: [phone] Office: [phone]   Rudean Hitt 03/25/2019, 4:07 PM

## 2019-03-25 NOTE — Telephone Encounter (Signed)
Set up office visit early next week.  Can you find out what pills she he is taking 2 times if it Aldactone or Lasix.

## 2019-03-25 NOTE — Telephone Encounter (Signed)
Pt wife is calling to schedule Ed f/u apt for Acides pt has fluid built  Up. Pt is being put on 2 fluid Pills she wanted to let Dr. Vicente Males know this. Pt is scheduled to F/u with [place] on 03/27/19

## 2019-03-25 NOTE — Procedures (Signed)
PROCEDURE SUMMARY:  Successful image-guided paracentesis from the right lower abdomen.  Yielded 5.2 liters of clear yellow fluid.  No immediate complications.  EBL: zero Patient tolerated well.   Specimen was sent for labs.  Please see imaging section of Epic for full dictation.  Joaquim Nam PA-C 03/25/2019 10:25 AM

## 2019-03-26 DIAGNOSIS — K7460 Unspecified cirrhosis of liver: Secondary | ICD-10-CM | POA: Diagnosis not present

## 2019-03-26 DIAGNOSIS — R188 Other ascites: Secondary | ICD-10-CM | POA: Diagnosis not present

## 2019-03-26 LAB — CBC
HCT: 31.9 % — ABNORMAL LOW (ref 39.0–52.0)
Hemoglobin: 10.6 g/dL — ABNORMAL LOW (ref 13.0–17.0)
MCH: 33.5 pg (ref 26.0–34.0)
MCHC: 33.2 g/dL (ref 30.0–36.0)
MCV: 100.9 fL — ABNORMAL HIGH (ref 80.0–100.0)
Platelets: 47 10*3/uL — ABNORMAL LOW (ref 150–400)
RBC: 3.16 MIL/uL — ABNORMAL LOW (ref 4.22–5.81)
RDW: 14.9 % (ref 11.5–15.5)
WBC: 3.7 10*3/uL — ABNORMAL LOW (ref 4.0–10.5)
nRBC: 0 % (ref 0.0–0.2)

## 2019-03-26 LAB — BASIC METABOLIC PANEL
Anion gap: 5 (ref 5–15)
BUN: 9 mg/dL (ref 8–23)
CO2: 29 mmol/L (ref 22–32)
Calcium: 8.1 mg/dL — ABNORMAL LOW (ref 8.9–10.3)
Chloride: 103 mmol/L (ref 98–111)
Creatinine, Ser: 0.81 mg/dL (ref 0.61–1.24)
GFR calc Af Amer: >60 mL/min (ref >60)
GFR calc non Af Amer: >60 mL/min (ref >60)
Glucose, Bld: 95 mg/dL (ref 70–99)
Potassium: 3.7 mmol/L (ref 3.5–5.1)
Sodium: 137 mmol/L (ref 135–145)

## 2019-03-26 MED ORDER — SPIRONOLACTONE 100 MG PO TABS: 100.0000 mg | ORAL_TABLET | Freq: Every day | ORAL | 1 refills | Status: DC

## 2019-03-26 MED ORDER — FUROSEMIDE 40 MG PO TABS: 40.0000 mg | ORAL_TABLET | Freq: Every day | ORAL | 1 refills | Status: DC

## 2019-03-26 MED ORDER — METOPROLOL SUCCINATE ER 25 MG PO TB24: 12.5000 mg | ORAL_TABLET | Freq: Every day | ORAL | 1 refills | Status: DC

## 2019-03-26 MED FILL — SPIRONOLACTONE 100 MG TABS: 100 | 30 days supply | Qty: 30 | Fill #0

## 2019-03-26 MED FILL — FUROSEMIDE 40 MG TABLET: 40 | 30 days supply | Qty: 30 | Fill #0

## 2019-03-26 NOTE — TOC Transition Note (Signed)
Transition of Care [facility]) - CM/SW Discharge Note   Patient Details  Name: [name] MRN: [id number] Date of Birth: [birth year]  [facility] CM/SW Contact:  Bartholomew Crews, RN Phone Number: [phone] 03/26/2019, 12:39 PM   Clinical Narrative:    Spoke with patient and spouse at bedside. PTA active with [place] for RN and PT. Spoke with liaison at [place] to advise of discharge. No further transition of care needs identified.    Final next level of care: Home w Home Health Services Barriers to Discharge: No Barriers Identified   Patient Goals and CMS Choice   CMS Medicare.gov Compare Post Acute Care list provided to:: Patient Choice offered to / list presented to : Patient  Discharge Placement                       Discharge Plan and Services                DME Arranged: N/A DME Agency: NA       HH Arranged: PT [place] Agency: [place] ([place]) Date [place]: 03/26/19 Time [place]: [place] Representative spoke with at [place]: [place] ([place]) Interventions     Readmission Risk Interventions No flowsheet data found.

## 2019-03-26 NOTE — Telephone Encounter (Signed)
[place] office visit next week

## 2019-03-26 NOTE — Progress Notes (Signed)
Late Entry  Patient was discharged to home, SWOT nurse assisted with patient's discharge and reviewed AVS. Family provided transportation.

## 2019-03-26 NOTE — Discharge Summary (Signed)
Physician Discharge Summary  [name] NGE:[id number] DOB: 1955/09/26 DOA: 03/24/2019  PCP: Ria Bush, MD  Admit date: 03/24/2019 Discharge date: 03/26/2019  Time spent: 45  minutes  Recommendations for Outpatient Follow-up:  1. Follow up with Dr. Vicente Males next week as scheduled. Recommend bmet to track electrolytes 2. Home Health PT for safety and endurence   Discharge Diagnoses:  Principal Problem:   Cirrhosis of liver with ascites ([place]) Active Problems:   Acute respiratory failure with hypoxemia (HCC)   Severe obesity (BMI 35.0-39.9) with comorbidity ([place])   Essential hypertension   CAD (coronary artery disease)   COPD (chronic obstructive pulmonary disease) (HCC)   Thrombocytopenia (HCC)   HLD (hyperlipidemia)   Smoker   Charcot-Marie-Tooth disease   Chronic pain syndrome   Abdominal aortic aneurysm (AAA) without rupture ([place])   Discharge Condition: stable  Diet recommendation: heart healthy low sodium  Filed Weights   03/24/19 1140 03/26/19 0550  Weight: 120.2 kg 121 kg    History of present illness:  [name] is a 63 y.o. male with medical history significant of hypertension, hyperlipidemia, coronary artery disease s/p stents, chronic pain syndrome, AAA, COPD, diabetes, liver cirrhosis due to [place] tooth muscular atrophy, tobacco abuse, morbid obesity presented 10/19 to emergency department due to worsening abdominal distention and shortness of breath since couple of weeks.  Patient reported that he had telemedicine visit with his PCP on 10/16 for similar symptoms and he was scheduled to have home health nurse come to his home 10/19 to obtain screening labs.  Eventually got to the house the nurse was concerned given patient's abdominal distention and shortness of breath and called PCPs office regarding ED evaluation and recommended that she called EMS and transport patient to the emergency department for further evaluation and  management.  No history of previous paracentesis.  He is not on home oxygen.  Patient denies abdominal pain, fever, chills, hematemesis, melena, decreased appetite, weakness, fatigue or lethargy.  He has exertional shortness of breath and chronic leg swelling however denies association with chest pain, palpitation, orthopnea, PND.  He is compliant with his home medications.  He is on fentanyl patch and oxycodone for his chronic pain syndrome.  He lives with his wife at home, smokes 1 pack of cigarettes per day, denies alcohol, illicit drug use  Hospital Course:  Cirrhosis of liver with ascites: 2/2 NASH.Patient presented with worseningabdominal distention and shortness of breath. S/p paracentesis 10/20 with 5.2L removed. Labs ordered and reveal glucose 107, culture and gram stain negative, cell count with mesothelial. Lasix and aldactone added to regimen.  BP remained stable. His metoprolol dose was decreased as well. Alk phos elevated but trending down at discharge, total bili 0.9. Has follow up apt with Dr. Vicente Males next week.   Acute hypoxic respiratory failure:resolved.  He is not on oxygen at home.Chest x-ray: Shows borderline cardiac enlargement. Echo from 5/20 revealed ejection fraction of 35 to 40% with grade 1 diastolic congestive heart failure. BNP is elevated. He was provided with IV lasix. At discharge lasix and aldactone added to home meds. Instructed to weigh daily.  Hypertension: Stable Continuemetoprolol at lower dose.   COPD: Stable.   Coronary artery disease status post stents: No chest pain..  Thrombocytopenia: Likely secondary to cirrhosis of liver. Current level close to baseline. No signs of bleeding.  Chronic pain syndrome: remained stable at baseline. Patient is on fentanyl patch and oxycodoneand Cymbaltaat home for chronic pain due to his muscular dystrophy.  Macrocytic anemia: Secondary to B12 and folate deficiency. Hg 10.1 which appears to be close to  baseline. Continue B12 and folate supplements.  History of abdominal aortic aneurysm: Aware  Morbid obesity: BMI of 38.  Tobacco abuse: Discussed about cessation.   Procedures: Paracentesis 10/20 Consultations:    Discharge Exam: Vitals:   03/26/19 0800 03/26/19 1003  BP: 122/75 (!) 98/48  Pulse: 67 66  Resp: 16   Temp: 98 F (36.7 C) 98.5 F (36.9 C)  SpO2: 95% 96%    General: obese awake alert no acute distress Cardiovascular: rrr no mgr no LE edema Respiratory: normal effort BS clear bilaterally no wheeze   Discharge Instructions   Discharge Instructions    Call MD for:  extreme fatigue   Complete by: As directed    Call MD for:  persistant dizziness or light-headedness   Complete by: As directed    Call MD for:  temperature >100.4   Complete by: As directed    Diet - low sodium heart healthy   Complete by: As directed    Discharge instructions   Complete by: As directed    Take medications as prescribed Weigh daily and call doctor for weight gain of 2-5lbs in 24-36 hour time period Follow up with GI 1 week for evaluation of symptoms and lab work.   Increase activity slowly   Complete by: As directed      Allergies as of 03/26/2019      Reactions   Statins Shortness Of Breath   Cough, trouble breathing Cough, trouble breathing   Losartan Other (See Comments)   Causes him to have pain   Penicillins    Did it involve swelling of the face/tongue/throat, SOB, or low BP? N/A Did it involve sudden or severe rash/hives, skin peeling, or any reaction on the inside of your mouth or nose? N/A Did you need to seek medical attention at a hospital or doctor's office? N/A When did it last happen? Child If all above answers are "NO", may proceed with cephalosporin use.   Tramadol Nausea Only   Allopurinol Nausea Only   Baclofen Nausea And Vomiting   Gabapentin Nausea And Vomiting      Medication List    TAKE these medications   albuterol (2.5  MG/3ML) 0.083% nebulizer solution Commonly known as: PROVENTIL USE 1 VIAL PER NEBULIZER EVERY 6 HRS AS NEEDED FOR WHEEZING What changed:   how much to take  how to take this  when to take this  reasons to take this  additional instructions   albuterol 108 (90 Base) MCG/ACT inhaler Commonly known as: VENTOLIN HFA Inhale 2 puffs into the lungs every 6 (six) hours as needed for wheezing or shortness of breath. What changed: Another medication with the same name was changed. Make sure you understand how and when to take each.   aspirin 81 MG tablet Take 1 tablet (81 mg total) by mouth daily.   B-12 1000-400 MCG Subl Take 1 tablet by mouth daily.   Ca Phosphate-Cholecalciferol [phone] MG-UNIT Tabs Take 1 tablet by mouth daily.   dicyclomine 20 MG tablet Commonly known as: BENTYL TAKE 1 TABLET BY MOUTH 3 TIMES DAILY BEFORE MEALS. AS NEEDED FOR ABDOMINAL PAIN What changed: See the new instructions.   DULoxetine 60 MG capsule Commonly known as: CYMBALTA Take 1 capsule (60 mg total) by mouth daily.   fentaNYL 50 MCG/HR Commonly known as: DURAGESIC Place 1 patch (50 mcg total) every other day onto the skin.  fluticasone 50 MCG/ACT nasal spray Commonly known as: FLONASE Place 2 sprays into both nostrils daily as needed for allergies.   folic acid 1 MG tablet Commonly known as: FOLVITE Take 1 tablet (1 mg total) by mouth daily.   furosemide 40 MG tablet Commonly known as: LASIX Take 1 tablet (40 mg total) by mouth daily.   lactulose 20 g packet Commonly known as: CEPHULAC Take 1 packet (20 g total) by mouth 3 (three) times daily.   metoprolol succinate 25 MG 24 hr tablet Commonly known as: TOPROL-XL Take 0.5 tablets (12.5 mg total) by mouth daily.   nitroGLYCERIN 0.4 MG SL tablet Commonly known as: NITROSTAT Place 1 tablet (0.4 mg total) under the tongue every 5 (five) minutes as needed for chest pain.   nystatin cream Commonly known as: MYCOSTATIN Apply 1  application topically 2 (two) times daily.   omeprazole 40 MG capsule Commonly known as: PRILOSEC TAKE 1 CAPSULE BY MOUTH EVERY DAY What changed: how much to take   ondansetron 4 MG disintegrating tablet Commonly known as: ZOFRAN-ODT Take 1 tablet (4 mg total) by mouth every 8 (eight) hours as needed for nausea or vomiting. Day supply per insurance.   oxyCODONE 15 MG immediate release tablet Commonly known as: ROXICODONE Take 1 tablet (15 mg total) by mouth every 4 (four) hours as needed. What changed:   how much to take  when to take this  additional instructions   Red Yeast Rice 600 MG Caps Take 2 capsules by mouth daily.   spironolactone 100 MG tablet Commonly known as: ALDACTONE Take 1 tablet (100 mg total) by mouth daily.   SUPER B COMPLEX PO Take 1 tablet by mouth daily.   Tart Cherry Advanced Caps Take 1 capsule by mouth 2 (two) times daily.   vitamin B-12 500 MCG tablet Commonly known as: CYANOCOBALAMIN Take 1 tablet (500 mcg total) by mouth every Monday, Wednesday, and Friday.   Vitamin D3 50 MCG (2000 UT) Tabs Take 2,000 Units by mouth daily.      Allergies  Allergen Reactions  . Statins Shortness Of Breath    Cough, trouble breathing Cough, trouble breathing  . Losartan Other (See Comments)    Causes him to have pain  . Penicillins     Did it involve swelling of the face/tongue/throat, SOB, or low BP? N/A Did it involve sudden or severe rash/hives, skin peeling, or any reaction on the inside of your mouth or nose? N/A Did you need to seek medical attention at a hospital or doctor's office? N/A When did it last happen? Child If all above answers are "NO", may proceed with cephalosporin use.  . Tramadol Nausea Only  . Allopurinol Nausea Only  . Baclofen Nausea And Vomiting  . Gabapentin Nausea And Vomiting      The results of significant diagnostics from this hospitalization (including imaging, microbiology, ancillary and laboratory) are  listed below for reference.    Significant Diagnostic Studies: Dg Chest Port 1 View  Result Date: 03/24/2019 CLINICAL DATA:  Shortness of breath.  Hypertension. EXAM: PORTABLE CHEST 1 VIEW COMPARISON:  November 30, 2015 FINDINGS: There is no edema or consolidation. Heart is borderline enlarged with pulmonary vascularity normal. No adenopathy. There is postoperative change in the lower cervical region. IMPRESSION: No edema or consolidation.  Borderline cardiac enlargement. Electronically Signed   By: Lowella Grip III M.D.   On: 03/24/2019 12:28   Ir Paracentesis  Result Date: 03/25/2019 INDICATION: Patient with history of NASH cirrhosis,  new onset abdominal distension. Request to IR for diagnostic and therapeutic paracentesis. EXAM: ULTRASOUND GUIDED DIAGNOSTIC AND THERAPEUTIC PARACENTESIS MEDICATIONS: 10 mL 1% lidocaine COMPLICATIONS: None immediate. PROCEDURE: Informed written consent was obtained from the patient after a discussion of the risks, benefits and alternatives to treatment. A timeout was performed prior to the initiation of the procedure. Initial ultrasound scanning demonstrates a large amount of ascites within the right lower abdominal quadrant. The right lower abdomen was prepped and draped in the usual sterile fashion. 1% lidocaine was used for local anesthesia. Following this, a 19 gauge, 10 cm, Yueh catheter was introduced. An ultrasound image was saved for documentation purposes. The paracentesis was performed. The catheter was removed and a dressing was applied. The patient tolerated the procedure well without immediate post procedural complication. FINDINGS: A total of approximately 5.2 L of clear yellow fluid was removed. Samples were sent to the laboratory as requested by the clinical team. IMPRESSION: Successful ultrasound-guided paracentesis yielding 5.2 liters of peritoneal fluid. Read by Candiss Norse, PA-C Electronically Signed   By: Aletta Edouard M.D.   On: 03/25/2019  10:28    Microbiology: Recent Results (from the past 240 hour(s))  SARS CORONAVIRUS 2 (TAT 6-24 HRS) Nasopharyngeal Nasopharyngeal Swab     Status: None   Collection Time: 03/24/19  3:16 PM   Specimen: Nasopharyngeal Swab  Result Value Ref Range Status   SARS Coronavirus 2 NEGATIVE NEGATIVE Final    Comment: (NOTE) SARS-CoV-2 target nucleic acids are NOT DETECTED. The SARS-CoV-2 RNA is generally detectable in upper and lower respiratory specimens during the acute phase of infection. Negative results do not preclude SARS-CoV-2 infection, do not rule out co-infections with other pathogens, and should not be used as the sole basis for treatment or other patient management decisions. Negative results must be combined with clinical observations, patient history, and epidemiological information. The expected result is Negative. Fact Sheet for Patients: SugarRoll.be Fact Sheet for Healthcare Providers: https://www.woods-mathews.com/ This test is not yet approved or cleared by the [place] FDA and  has been authorized for detection and/or diagnosis of SARS-CoV-2 by FDA under an Emergency Use Authorization (EUA). This EUA will remain  in effect (meaning this test can be used) for the duration of the COVID-19 declaration under Section 56 4(b)(1) of the Act, 21 U.S.C. section 360bbb-3(b)(1), unless the authorization is terminated or revoked sooner. Performed at [facility], [place] [address], [place], [place] [address]   Stat Gram stain     Status: None   Collection Time: 03/25/19  7:59 AM   Specimen: Peritoneal Washings; Body Fluid  Result Value Ref Range Status   Specimen Description PERITONEAL  Final   Special Requests ABDOMEN  Final   Gram Stain   Final    WBC PRESENT, PREDOMINANTLY MONONUCLEAR NO ORGANISMS SEEN CYTOSPIN SMEAR Performed at [facility], [address] [place], [place] [address]    Report Status 03/25/2019  FINAL  Final  Culture, body fluid-bottle     Status: None (Preliminary result)   Collection Time: 03/25/19  7:59 AM   Specimen: Peritoneal Washings  Result Value Ref Range Status   Specimen Description PERITONEAL  Final   Special Requests NONE  Final   Culture   Final    NO GROWTH < 24 HOURS Performed at [facility], [place] [address] [place], [place] [address]    Report Status PENDING  Incomplete     Labs: Basic Metabolic Panel: Recent Labs  Lab 03/24/19  1230 03/24/19 1805 03/25/19 0622 03/26/19 0526  NA 136  --  139 137  K 4.1  --  4.1 3.7  CL 104  --  106 103  CO2 26  --  26 29  GLUCOSE 99  --  99 95  BUN 11  --  12 9  CREATININE 0.77  --  0.73 0.81  CALCIUM 8.2*  --  8.1* 8.1*  MG  --  1.8  --   --   PHOS  --  3.1  --   --    Liver Function Tests: Recent Labs  Lab 03/24/19 1230  AST 25  ALT 13  ALKPHOS 138*  BILITOT 1.0  PROT 6.3*  ALBUMIN 2.2*   No results for input(s): LIPASE, AMYLASE in the last 168 hours. No results for input(s): AMMONIA in the last 168 hours. CBC: Recent Labs  Lab 03/24/19 1230 03/24/19 1805 03/25/19 0622 03/26/19 0526  WBC 4.1  --  3.4* 3.7*  NEUTROABS 2.8  --   --   --   HGB 11.1*  --  10.1* 10.6*  HCT 33.7* 32.8* 32.0* 31.9*  MCV 101.8*  --  102.9* 100.9*  PLT 49*  --  49* 47*   Cardiac Enzymes: No results for input(s): CKTOTAL, CKMB, CKMBINDEX, TROPONINI in the last 168 hours. BNP: BNP (last 3 results) Recent Labs    03/24/19 1230  BNP 140.1*    ProBNP (last 3 results) No results for input(s): PROBNP in the last 8760 hours.  CBG: No results for input(s): GLUCAP in the last 168 hours.     SignedRadene Gunning NP [facility] 03/26/2019, 11:37 AM

## 2019-03-26 NOTE — Plan of Care (Signed)
  Problem: Clinical Measurements: Goal: Ability to maintain clinical measurements within normal limits will improve Outcome: Progressing Goal: Respiratory complications will improve Outcome: Progressing   Problem: Activity: Goal: Risk for activity intolerance will decrease Outcome: Progressing   

## 2019-03-27 ENCOUNTER — Telehealth: Payer: Self-pay

## 2019-03-27 ENCOUNTER — Ambulatory Visit: Payer: Medicare PPO | Admitting: Gastroenterology

## 2019-03-27 DIAGNOSIS — Z87440 Personal history of urinary (tract) infections: Secondary | ICD-10-CM | POA: Diagnosis not present

## 2019-03-27 DIAGNOSIS — H9190 Unspecified hearing loss, unspecified ear: Secondary | ICD-10-CM | POA: Diagnosis not present

## 2019-03-27 DIAGNOSIS — R296 Repeated falls: Secondary | ICD-10-CM | POA: Diagnosis not present

## 2019-03-27 DIAGNOSIS — M1611 Unilateral primary osteoarthritis, right hip: Secondary | ICD-10-CM | POA: Diagnosis not present

## 2019-03-27 DIAGNOSIS — G600 Hereditary motor and sensory neuropathy: Secondary | ICD-10-CM | POA: Diagnosis not present

## 2019-03-27 DIAGNOSIS — K7460 Unspecified cirrhosis of liver: Secondary | ICD-10-CM | POA: Diagnosis not present

## 2019-03-27 DIAGNOSIS — Z87891 Personal history of nicotine dependence: Secondary | ICD-10-CM | POA: Diagnosis not present

## 2019-03-27 DIAGNOSIS — Z9181 History of falling: Secondary | ICD-10-CM | POA: Diagnosis not present

## 2019-03-27 DIAGNOSIS — Z6838 Body mass index (BMI) 38.0-38.9, adult: Secondary | ICD-10-CM | POA: Diagnosis not present

## 2019-03-27 LAB — PATHOLOGIST SMEAR REVIEW

## 2019-03-27 NOTE — Telephone Encounter (Signed)
Melinda with [facility] left v/m; pt was in ED for observation and Rip Harbour with [facility] wants to know if Dr Darnell Level wants to continue [facility] nursing services and if so is there any labs Dr Darnell Level wants drawn on pt.

## 2019-03-27 NOTE — Telephone Encounter (Signed)
Please continue [facility] care after recent discharge with med changes.  No labs at this time. MELD-Na score: 10 at 03/26/2019  5:26 AM MELD score: 10 at 03/26/2019  5:26 AM Calculated from: Serum Creatinine: 0.81 mg/dL (Rounded to 1 mg/dL) at 03/26/2019  5:26 AM Serum Sodium: 137 mmol/L at 03/26/2019  5:26 AM Total Bilirubin: 1.0 mg/dL at 03/24/2019 12:30 PM INR(ratio): 1.4 at 03/25/2019  6:22 AM Age: 63 years

## 2019-03-28 ENCOUNTER — Telehealth: Payer: Self-pay

## 2019-03-28 NOTE — Telephone Encounter (Signed)
Left message for patient to complete TCM call and then send message to Dr Darnell Level to see if patient needs to follow up here or not, he has an appointment on 04/01/2019 with GI

## 2019-03-28 NOTE — Telephone Encounter (Signed)
Spoke with [facility] relaying Dr. Synthia Innocent message.  Verbalizes understanding.

## 2019-03-28 NOTE — Telephone Encounter (Signed)
Spoke with patient's wife. Patient will call us back if he needs to follow up.

## 2019-03-28 NOTE — Telephone Encounter (Signed)
Transition Care Management Follow-up Telephone Call  Dr Darnell Level, does patient need to follow up here or just to wait after he sees GI doctor on 04/01/2019?   Date discharged? 03/26/2019   How have you been since you were released from the hospital? Patient's wife bought a scaled for patient to weigh himself today. Patient is taking his fluid pill in the morning. Still feels tired but overall better. Has an appointment with GI on 04/01/2019.   Do you understand why you were in the hospital? yes   Do you understand the discharge instructions? yes   Where were you discharged to? Home with his wife.   Items Reviewed:  Medications reviewed: yes  Allergies reviewed: yes  Dietary changes reviewed: yes  Referrals reviewed: yes   Functional Questionnaire:   Activities of Daily Living (ADLs):   He states they are independent in the following: not at this time. States they require assistance with the following: dressing, hygiene, toileting, bathing, grooming, fixing food and fixing medication, ambulation.   Any transportation issues/concerns?: No   Any patient concerns? Not at this time.   Confirmed importance and date/time of follow-up visits scheduled not at this time   Confirmed with patient if condition begins to worsen call PCP or go to the ER.  Patient was given the office number and encouraged to call back with question or concerns.  : yes

## 2019-03-28 NOTE — Telephone Encounter (Signed)
Most important to see GI next week.  Did he want to f/u here? If not can start with GI visit.

## 2019-03-28 NOTE — Telephone Encounter (Signed)
Patients wife is returning your call

## 2019-03-30 DIAGNOSIS — Z87440 Personal history of urinary (tract) infections: Secondary | ICD-10-CM | POA: Diagnosis not present

## 2019-03-30 DIAGNOSIS — G600 Hereditary motor and sensory neuropathy: Secondary | ICD-10-CM | POA: Diagnosis not present

## 2019-03-30 DIAGNOSIS — R296 Repeated falls: Secondary | ICD-10-CM | POA: Diagnosis not present

## 2019-03-30 DIAGNOSIS — Z6838 Body mass index (BMI) 38.0-38.9, adult: Secondary | ICD-10-CM | POA: Diagnosis not present

## 2019-03-30 DIAGNOSIS — M1611 Unilateral primary osteoarthritis, right hip: Secondary | ICD-10-CM | POA: Diagnosis not present

## 2019-03-30 DIAGNOSIS — K7460 Unspecified cirrhosis of liver: Secondary | ICD-10-CM | POA: Diagnosis not present

## 2019-03-30 DIAGNOSIS — Z9181 History of falling: Secondary | ICD-10-CM | POA: Diagnosis not present

## 2019-03-30 DIAGNOSIS — H9190 Unspecified hearing loss, unspecified ear: Secondary | ICD-10-CM | POA: Diagnosis not present

## 2019-03-30 DIAGNOSIS — Z87891 Personal history of nicotine dependence: Secondary | ICD-10-CM | POA: Diagnosis not present

## 2019-03-30 LAB — CULTURE, BODY FLUID W GRAM STAIN -BOTTLE: Culture: NO GROWTH

## 2019-03-31 ENCOUNTER — Telehealth: Payer: Self-pay

## 2019-03-31 LAB — LD, BODY FLUID (OTHER): LD, Body Fluid: 50 IU/L

## 2019-03-31 NOTE — Telephone Encounter (Signed)
Agree with these verbal orders thanks.

## 2019-03-31 NOTE — Telephone Encounter (Signed)
Left message on vm for [place] her Dr. Darnell Level is giving verbal orders for services requested.

## 2019-03-31 NOTE — Telephone Encounter (Signed)
Mendel Ryder, with [facility], left message stating she needs verbal orders for patient to continue receiving skilled nursing 2 x for 2 weeks with 3 as needed visits. CB is [phone], if Mendel Ryder does not answer it is ok to leave a detailed message on her voicemail.

## 2019-04-01 ENCOUNTER — Ambulatory Visit (INDEPENDENT_AMBULATORY_CARE_PROVIDER_SITE_OTHER): Payer: Medicare PPO | Admitting: Gastroenterology

## 2019-04-01 ENCOUNTER — Other Ambulatory Visit: Payer: Self-pay

## 2019-04-01 DIAGNOSIS — R188 Other ascites: Secondary | ICD-10-CM

## 2019-04-01 DIAGNOSIS — K7460 Unspecified cirrhosis of liver: Secondary | ICD-10-CM

## 2019-04-01 NOTE — Progress Notes (Signed)
Error

## 2019-04-02 DIAGNOSIS — Z9181 History of falling: Secondary | ICD-10-CM | POA: Diagnosis not present

## 2019-04-02 DIAGNOSIS — Z87891 Personal history of nicotine dependence: Secondary | ICD-10-CM | POA: Diagnosis not present

## 2019-04-02 DIAGNOSIS — Z6838 Body mass index (BMI) 38.0-38.9, adult: Secondary | ICD-10-CM | POA: Diagnosis not present

## 2019-04-02 DIAGNOSIS — G600 Hereditary motor and sensory neuropathy: Secondary | ICD-10-CM | POA: Diagnosis not present

## 2019-04-02 DIAGNOSIS — H9190 Unspecified hearing loss, unspecified ear: Secondary | ICD-10-CM | POA: Diagnosis not present

## 2019-04-02 DIAGNOSIS — K7460 Unspecified cirrhosis of liver: Secondary | ICD-10-CM | POA: Diagnosis not present

## 2019-04-02 DIAGNOSIS — R069 Unspecified abnormalities of breathing: Secondary | ICD-10-CM | POA: Diagnosis not present

## 2019-04-02 DIAGNOSIS — R296 Repeated falls: Secondary | ICD-10-CM | POA: Diagnosis not present

## 2019-04-02 DIAGNOSIS — Z87440 Personal history of urinary (tract) infections: Secondary | ICD-10-CM | POA: Diagnosis not present

## 2019-04-02 DIAGNOSIS — M1611 Unilateral primary osteoarthritis, right hip: Secondary | ICD-10-CM | POA: Diagnosis not present

## 2019-04-08 ENCOUNTER — Ambulatory Visit: Payer: Medicare PPO | Admitting: Urology

## 2019-04-09 ENCOUNTER — Telehealth: Payer: Self-pay | Admitting: Family Medicine

## 2019-04-09 NOTE — Telephone Encounter (Signed)
Left message on vm for [place] her Dr. Darnell Level is giving verbal orders for services requested.

## 2019-04-09 NOTE — Telephone Encounter (Signed)
[name] called to Verbal approval to re certified for home health PT

## 2019-04-12 ENCOUNTER — Other Ambulatory Visit: Payer: Self-pay | Admitting: Family Medicine

## 2019-04-14 DIAGNOSIS — R069 Unspecified abnormalities of breathing: Secondary | ICD-10-CM | POA: Diagnosis not present

## 2019-04-14 DIAGNOSIS — G600 Hereditary motor and sensory neuropathy: Secondary | ICD-10-CM | POA: Diagnosis not present

## 2019-04-14 DIAGNOSIS — R296 Repeated falls: Secondary | ICD-10-CM | POA: Diagnosis not present

## 2019-04-14 DIAGNOSIS — R188 Other ascites: Secondary | ICD-10-CM | POA: Diagnosis not present

## 2019-04-14 DIAGNOSIS — M1611 Unilateral primary osteoarthritis, right hip: Secondary | ICD-10-CM | POA: Diagnosis not present

## 2019-04-14 DIAGNOSIS — H9190 Unspecified hearing loss, unspecified ear: Secondary | ICD-10-CM | POA: Diagnosis not present

## 2019-04-14 DIAGNOSIS — K7581 Nonalcoholic steatohepatitis (NASH): Secondary | ICD-10-CM | POA: Diagnosis not present

## 2019-04-14 DIAGNOSIS — I2510 Atherosclerotic heart disease of native coronary artery without angina pectoris: Secondary | ICD-10-CM | POA: Diagnosis not present

## 2019-04-14 DIAGNOSIS — K7469 Other cirrhosis of liver: Secondary | ICD-10-CM | POA: Diagnosis not present

## 2019-04-14 DIAGNOSIS — I255 Ischemic cardiomyopathy: Secondary | ICD-10-CM | POA: Diagnosis not present

## 2019-04-16 DIAGNOSIS — M542 Cervicalgia: Secondary | ICD-10-CM | POA: Diagnosis not present

## 2019-04-16 DIAGNOSIS — M545 Low back pain: Secondary | ICD-10-CM | POA: Diagnosis not present

## 2019-04-16 DIAGNOSIS — Z79891 Long term (current) use of opiate analgesic: Secondary | ICD-10-CM | POA: Diagnosis not present

## 2019-04-16 DIAGNOSIS — G894 Chronic pain syndrome: Secondary | ICD-10-CM | POA: Diagnosis not present

## 2019-04-17 ENCOUNTER — Ambulatory Visit: Payer: Medicare PPO | Admitting: Urology

## 2019-04-17 DIAGNOSIS — I255 Ischemic cardiomyopathy: Secondary | ICD-10-CM | POA: Diagnosis not present

## 2019-04-17 DIAGNOSIS — G600 Hereditary motor and sensory neuropathy: Secondary | ICD-10-CM | POA: Diagnosis not present

## 2019-04-17 DIAGNOSIS — K7581 Nonalcoholic steatohepatitis (NASH): Secondary | ICD-10-CM | POA: Diagnosis not present

## 2019-04-17 DIAGNOSIS — R296 Repeated falls: Secondary | ICD-10-CM | POA: Diagnosis not present

## 2019-04-17 DIAGNOSIS — M1611 Unilateral primary osteoarthritis, right hip: Secondary | ICD-10-CM | POA: Diagnosis not present

## 2019-04-17 DIAGNOSIS — H9190 Unspecified hearing loss, unspecified ear: Secondary | ICD-10-CM | POA: Diagnosis not present

## 2019-04-17 DIAGNOSIS — I2510 Atherosclerotic heart disease of native coronary artery without angina pectoris: Secondary | ICD-10-CM | POA: Diagnosis not present

## 2019-04-17 DIAGNOSIS — R188 Other ascites: Secondary | ICD-10-CM | POA: Diagnosis not present

## 2019-04-17 DIAGNOSIS — K7469 Other cirrhosis of liver: Secondary | ICD-10-CM | POA: Diagnosis not present

## 2019-04-21 DIAGNOSIS — K7469 Other cirrhosis of liver: Secondary | ICD-10-CM | POA: Diagnosis not present

## 2019-04-21 DIAGNOSIS — R296 Repeated falls: Secondary | ICD-10-CM | POA: Diagnosis not present

## 2019-04-21 DIAGNOSIS — M1611 Unilateral primary osteoarthritis, right hip: Secondary | ICD-10-CM | POA: Diagnosis not present

## 2019-04-21 DIAGNOSIS — I255 Ischemic cardiomyopathy: Secondary | ICD-10-CM | POA: Diagnosis not present

## 2019-04-21 DIAGNOSIS — R188 Other ascites: Secondary | ICD-10-CM | POA: Diagnosis not present

## 2019-04-21 DIAGNOSIS — I2510 Atherosclerotic heart disease of native coronary artery without angina pectoris: Secondary | ICD-10-CM | POA: Diagnosis not present

## 2019-04-21 DIAGNOSIS — H9190 Unspecified hearing loss, unspecified ear: Secondary | ICD-10-CM | POA: Diagnosis not present

## 2019-04-21 DIAGNOSIS — G600 Hereditary motor and sensory neuropathy: Secondary | ICD-10-CM | POA: Diagnosis not present

## 2019-04-21 DIAGNOSIS — K7581 Nonalcoholic steatohepatitis (NASH): Secondary | ICD-10-CM | POA: Diagnosis not present

## 2019-04-22 ENCOUNTER — Telehealth: Payer: Self-pay

## 2019-04-22 NOTE — Telephone Encounter (Signed)
Patient came in office this morning thinking he had appointment with Dr.Crenshaw.Wife stated she called someone and was told he had appointment this morning.Patient was in a wheelchair.Stated he feels bad.He recently had fluid removed from abdomen.He has been having lower back pain,pain in hips,chest pain.Advised Dr.Harding will see him at 10:40 am.Stated he cannot wait that long wife has to go to work.After reviewing chart he has appointment this Fri 11/20 at 12:000 noon with PCP Dr.Gutierrez.Also has appointment with GI 11/24 at 1:00 pm with Dr.Anna.Advised his appointment with Dr.Crenshaw 12/17 at 9:40 am.Stated he will keep appointments as planned.

## 2019-04-23 ENCOUNTER — Telehealth: Payer: Self-pay | Admitting: *Deleted

## 2019-04-23 DIAGNOSIS — R188 Other ascites: Secondary | ICD-10-CM | POA: Diagnosis not present

## 2019-04-23 DIAGNOSIS — I2510 Atherosclerotic heart disease of native coronary artery without angina pectoris: Secondary | ICD-10-CM | POA: Diagnosis not present

## 2019-04-23 DIAGNOSIS — K7469 Other cirrhosis of liver: Secondary | ICD-10-CM | POA: Diagnosis not present

## 2019-04-23 DIAGNOSIS — M1611 Unilateral primary osteoarthritis, right hip: Secondary | ICD-10-CM | POA: Diagnosis not present

## 2019-04-23 DIAGNOSIS — K7581 Nonalcoholic steatohepatitis (NASH): Secondary | ICD-10-CM | POA: Diagnosis not present

## 2019-04-23 DIAGNOSIS — H9190 Unspecified hearing loss, unspecified ear: Secondary | ICD-10-CM | POA: Diagnosis not present

## 2019-04-23 DIAGNOSIS — I255 Ischemic cardiomyopathy: Secondary | ICD-10-CM | POA: Diagnosis not present

## 2019-04-23 DIAGNOSIS — G600 Hereditary motor and sensory neuropathy: Secondary | ICD-10-CM | POA: Diagnosis not present

## 2019-04-23 DIAGNOSIS — R296 Repeated falls: Secondary | ICD-10-CM | POA: Diagnosis not present

## 2019-04-23 NOTE — Telephone Encounter (Signed)
Noted. He was supposed to see GI last month after hospitalization - did he see Dr Vicente Males? Patient should be on lasix 42m daily and spironolactone 1062mdaily - please ensure he's taking both fluid pills.  If so, can double up on lasix to 403mid until we see him Friday.

## 2019-04-23 NOTE — Telephone Encounter (Signed)
Samantha PTA with [facility] left a voicemail stating that she is calling because of patient's weight gain. Aldona Bar stated that patient's eight today is 245 and Monday it was 239. Aldona Bar stated that she knows that patient is taking two fluid pills daily. Aldona Bar stated that patient is gaining about 2 lbs daily. Aldona Bar stated that patient's wife is keeping a log and she will bring it to his appointment Friday with Dr. Danise Mina.

## 2019-04-24 NOTE — Telephone Encounter (Signed)
Lvm for pt/wife, [name] (on dpr), to call back.  Need to relay Dr. Synthia Innocent message.

## 2019-04-24 NOTE — Telephone Encounter (Signed)
Patient is returning your call.  

## 2019-04-24 NOTE — Telephone Encounter (Addendum)
Spoke with pt's wife, [name] (on dpr).  States pt's next appt with Dr. Vicente Males is 04/29/19.  She states pt is taking Lasix and spironolactone every morning.  I relayed Dr. Synthia Innocent instructions to increase Lasix BID until OV tomorrow.  She verbalizes understanding.

## 2019-04-25 ENCOUNTER — Other Ambulatory Visit: Payer: Self-pay

## 2019-04-25 ENCOUNTER — Ambulatory Visit (INDEPENDENT_AMBULATORY_CARE_PROVIDER_SITE_OTHER): Payer: Medicare PPO | Admitting: Family Medicine

## 2019-04-25 ENCOUNTER — Encounter: Payer: Self-pay | Admitting: Family Medicine

## 2019-04-25 VITALS — BP 140/82 | HR 85 | Temp 97.9°F | Ht 70.0 in | Wt 250.2 lb

## 2019-04-25 DIAGNOSIS — F332 Major depressive disorder, recurrent severe without psychotic features: Secondary | ICD-10-CM

## 2019-04-25 DIAGNOSIS — G894 Chronic pain syndrome: Secondary | ICD-10-CM | POA: Diagnosis not present

## 2019-04-25 DIAGNOSIS — K7460 Unspecified cirrhosis of liver: Secondary | ICD-10-CM | POA: Diagnosis not present

## 2019-04-25 DIAGNOSIS — R531 Weakness: Secondary | ICD-10-CM

## 2019-04-25 DIAGNOSIS — D61818 Other pancytopenia: Secondary | ICD-10-CM | POA: Diagnosis not present

## 2019-04-25 DIAGNOSIS — F17200 Nicotine dependence, unspecified, uncomplicated: Secondary | ICD-10-CM

## 2019-04-25 DIAGNOSIS — I700 Atherosclerosis of aorta: Secondary | ICD-10-CM

## 2019-04-25 DIAGNOSIS — D649 Anemia, unspecified: Secondary | ICD-10-CM

## 2019-04-25 DIAGNOSIS — M25551 Pain in right hip: Secondary | ICD-10-CM | POA: Diagnosis not present

## 2019-04-25 DIAGNOSIS — R188 Other ascites: Secondary | ICD-10-CM

## 2019-04-25 DIAGNOSIS — D696 Thrombocytopenia, unspecified: Secondary | ICD-10-CM

## 2019-04-25 LAB — CBC WITH DIFFERENTIAL/PLATELET
Basophils Absolute: 0 10*3/uL (ref 0.0–0.1)
Basophils Relative: 0.7 % (ref 0.0–3.0)
Eosinophils Absolute: 0.2 10*3/uL (ref 0.0–0.7)
Eosinophils Relative: 3.3 % (ref 0.0–5.0)
HCT: 38.8 % — ABNORMAL LOW (ref 39.0–52.0)
Hemoglobin: 13.1 g/dL (ref 13.0–17.0)
Lymphocytes Relative: 19.2 % (ref 12.0–46.0)
Lymphs Abs: 0.9 10*3/uL (ref 0.7–4.0)
MCHC: 33.7 g/dL (ref 30.0–36.0)
MCV: 97.9 fl (ref 78.0–100.0)
Monocytes Absolute: 0.3 10*3/uL (ref 0.1–1.0)
Monocytes Relative: 6.8 % (ref 3.0–12.0)
Neutro Abs: 3.3 10*3/uL (ref 1.4–7.7)
Neutrophils Relative %: 70 % (ref 43.0–77.0)
Platelets: 59 10*3/uL — ABNORMAL LOW (ref 150.0–400.0)
RBC: 3.96 Mil/uL — ABNORMAL LOW (ref 4.22–5.81)
RDW: 16.8 % — ABNORMAL HIGH (ref 11.5–15.5)
WBC: 4.7 10*3/uL (ref 4.0–10.5)

## 2019-04-25 LAB — PROTIME-INR
INR: 1.3 ratio — ABNORMAL HIGH (ref 0.8–1.0)
Prothrombin Time: 14.6 s — ABNORMAL HIGH (ref 9.6–13.1)

## 2019-04-25 LAB — FERRITIN: Ferritin: 49.2 ng/mL (ref 22.0–322.0)

## 2019-04-25 LAB — IBC PANEL
Iron: 42 ug/dL (ref 42–165)
Saturation Ratios: 10.5 % — ABNORMAL LOW (ref 20.0–50.0)
Transferrin: 285 mg/dL (ref 212.0–360.0)

## 2019-04-25 LAB — COMPREHENSIVE METABOLIC PANEL
ALT: 9 U/L (ref 0–53)
AST: 18 U/L (ref 0–37)
Albumin: 3.3 g/dL — ABNORMAL LOW (ref 3.5–5.2)
Alkaline Phosphatase: 167 U/L — ABNORMAL HIGH (ref 39–117)
BUN: 17 mg/dL (ref 6–23)
CO2: 32 mEq/L (ref 19–32)
Calcium: 8.7 mg/dL (ref 8.4–10.5)
Chloride: 99 mEq/L (ref 96–112)
Creatinine, Ser: 0.94 mg/dL (ref 0.40–1.50)
GFR: 80.76 mL/min (ref >60.00)
Glucose, Bld: 118 mg/dL — ABNORMAL HIGH (ref 70–99)
Potassium: 4.4 mEq/L (ref 3.5–5.1)
Sodium: 136 mEq/L (ref 135–145)
Total Bilirubin: 1.1 mg/dL (ref 0.2–1.2)
Total Protein: 8 g/dL (ref 6.0–8.3)

## 2019-04-25 NOTE — Progress Notes (Signed)
This visit was conducted in person.  BP 140/82 (BP Location: Left Arm, Patient Position: Sitting, Cuff Size: Large)   Pulse 85   Temp 97.9 F (36.6 C) (Temporal)   Ht 5' 10"  (1.778 m)   Wt 250 lb 4 oz (113.5 kg)   SpO2 99%   BMI 35.91 kg/m    CC: cirrhosis concerns "not doing so well" Subjective:    Patient ID: [name], male    DOB: [birth year], 63 y.o.   MRN: [id number]  HPI: [name] is a 63 y.o. male presenting on 04/25/2019 for Cirrhosis (Here to discuss cirrhosis.  Pt accompanined by wife, Debbe Odea, 97.8].  Has a record of recent wts. )   Hospitalization last month for abd swelling and dyspnea with ascites from NASH cirrhosis s/p paracentesis 5.2L removed. Started on lasix 18m daily with spironolactone 1014mdaily - compliant.   HH continues working with patient.  HHTemplehone call 04/23/2019 with concern of weight gain (239lb --> 245lb over 2 days). We increased lasix to 4071mid. Today wieghed 240 lbs.   Ongoing R sided abd pain with nausea.   No fevers/chills, pedal edema, diarrhea/constipation.   Upcoming hosp f/u GI appt ([facility] week. (prior hosp f/u visit was cancelled).      Relevant past medical, surgical, family and social history reviewed and updated as indicated. Interim medical history since our last visit reviewed. Allergies and medications reviewed and updated. Outpatient Medications Prior to Visit  Medication Sig Dispense Refill  . albuterol (PROVENTIL HFA;VENTOLIN HFA) 108 (90 Base) MCG/ACT inhaler Inhale 2 puffs into the lungs every 6 (six) hours as needed for wheezing or shortness of breath. 1 Inhaler 0  . albuterol (PROVENTIL) (2.5 MG/3ML) 0.083% nebulizer solution USE 1 VIAL PER NEBULIZER EVERY 6 HRS AS NEEDED FOR WHEEZING (Patient taking differently: Take 2.5 mg by nebulization every 6 (six) hours as needed for shortness of breath. ) 75 mL 6  . aspirin 81 MG tablet Take 1 tablet (81 mg total) by mouth daily.    . B  Complex-C (SUPER B COMPLEX PO) Take 1 tablet by mouth daily.    . Ca Phosphate-Cholecalciferol [phone] MG-UNIT TABS Take 1 tablet by mouth daily.     . cholecalciferol 2000 units TABS Take 2,000 Units by mouth daily.    . Cobalamin Combinations (B-12) 1000-400 MCG SUBL Take 1 tablet by mouth daily.     . dMarland Kitchencyclomine (BENTYL) 20 MG tablet TAKE 1 TABLET BY MOUTH 3 TIMES DAILY BEFORE MEALS. AS NEEDED FOR ABDOMINAL PAIN (Patient taking differently: Take 20 mg by mouth 4 (four) times daily -  before meals and at bedtime. ) 30 tablet 0  . DULoxetine (CYMBALTA) 60 MG capsule Take 1 capsule (60 mg total) by mouth daily. 90 capsule 3  . fentaNYL (DURAGESIC - DOSED MCG/HR) 50 MCG/HR Place 1 patch (50 mcg total) every other day onto the skin. 15 patch 0  . fluticasone (FLONASE) 50 MCG/ACT nasal spray Place 2 sprays into both nostrils daily as needed for allergies.     . folic acid (FOLVITE) 1 MG tablet Take 1 tablet (1 mg total) by mouth daily. 90 tablet 3  . furosemide (LASIX) 40 MG tablet Take 1 tablet (40 mg total) by mouth daily. 30 tablet 1  . lactulose (CEPHULAC) 20 g packet Take 1 packet (20 g total) by mouth 3 (three) times daily. 30 each 0  . methocarbamol (ROBAXIN) 500 MG tablet TAKE 1 TABLET (500 MG TOTAL)  BY MOUTH AT BEDTIME. 30 tablet 0  . metoprolol succinate (TOPROL-XL) 25 MG 24 hr tablet Take 0.5 tablets (12.5 mg total) by mouth daily. 30 tablet 1  . Misc Natural Products (TART CHERRY ADVANCED) CAPS Take 1 capsule by mouth 2 (two) times daily.    . nitroGLYCERIN (NITROSTAT) 0.4 MG SL tablet Place 1 tablet (0.4 mg total) under the tongue every 5 (five) minutes as needed for chest pain. 25 tablet 12  . nystatin cream (MYCOSTATIN) Apply 1 application topically 2 (two) times daily. 80 g 0  . omeprazole (PRILOSEC) 40 MG capsule TAKE 1 CAPSULE BY MOUTH EVERY DAY (Patient taking differently: Take 40 mg by mouth daily. ) 30 capsule 11  . ondansetron (ZOFRAN-ODT) 4 MG disintegrating tablet Take 1  tablet (4 mg total) by mouth every 8 (eight) hours as needed for nausea or vomiting. Day supply per insurance. 20 tablet 3  . oxyCODONE (ROXICODONE) 15 MG immediate release tablet Take 1 tablet (15 mg total) by mouth every 4 (four) hours as needed. (Patient taking differently: Take 90 mg by mouth daily. Take 6 tablets by mouth daily per patient) 90 tablet 0  . Red Yeast Rice 600 MG CAPS Take 2 capsules by mouth daily.    [name] spironolactone (ALDACTONE) 100 MG tablet Take 1 tablet (100 mg total) by mouth daily. 30 tablet 1  . vitamin B-12 (CYANOCOBALAMIN) 500 MCG tablet Take 1 tablet (500 mcg total) by mouth every Monday, Wednesday, and Friday.     No facility-administered medications prior to visit.      Per HPI unless specifically indicated in ROS section below Review of Systems Objective:    BP 140/82 (BP Location: Left Arm, Patient Position: Sitting, Cuff Size: Large)   Pulse 85   Temp 97.9 F (36.6 C) (Temporal)   Ht 5' 10"  (1.778 m)   Wt 250 lb 4 oz (113.5 kg)   SpO2 99%   BMI 35.91 kg/m   Wt Readings from Last 3 Encounters:  04/25/19 250 lb 4 oz (113.5 kg)  03/26/19 266 lb 12.1 oz (121 kg)  02/04/19 262 lb (118.8 kg)    Physical Exam Vitals signs and nursing note reviewed.  Constitutional:      General: He is not in acute distress.    Appearance: Normal appearance. He is obese. He is ill-appearing (chronic).     Comments: In wheelchair  HENT:     Head: Normocephalic and atraumatic.     Mouth/Throat:     Pharynx: Oropharynx is clear. No posterior oropharyngeal erythema.     Comments: Slightly dry MM Eyes:     Extraocular Movements: Extraocular movements intact.     Conjunctiva/sclera: Conjunctivae normal.     Pupils: Pupils are equal, round, and reactive to light.  Cardiovascular:     Rate and Rhythm: Normal rate and regular rhythm.     Pulses: Normal pulses.     Heart sounds: Normal heart sounds. No murmur.  Pulmonary:     Effort: Pulmonary effort is normal. No  respiratory distress.     Breath sounds: Rhonchi (coarse) present. No wheezing or rales.     Comments: Coarse throughout Abdominal:     General: Abdomen is flat. There is no distension.     Palpations: Abdomen is soft. There is no mass.     Tenderness: There is abdominal tenderness (diffuse discomfort). There is no guarding or rebound.     Hernia: No hernia is present.  Musculoskeletal:     Right lower  leg: No edema.     Left lower leg: No edema.  Skin:    Findings: No rash.  Neurological:     Mental Status: He is alert.  Psychiatric:        Mood and Affect: Mood normal.        Behavior: Behavior normal.       Lab Results  Component Value Date   CREATININE 0.81 03/26/2019   BUN 9 03/26/2019   NA 137 03/26/2019   K 3.7 03/26/2019   CL 103 03/26/2019   CO2 29 03/26/2019    Lab Results  Component Value Date   ALT 13 03/24/2019   AST 25 03/24/2019   ALKPHOS 138 (H) 03/24/2019   BILITOT 1.0 03/24/2019    Lab Results  Component Value Date   TSH 1.41 12/10/2018    Lab Results  Component Value Date   VITAMINB12 1,097 (H) 03/24/2019   Lab Results  Component Value Date   WBC 3.7 (L) 03/26/2019   HGB 10.6 (L) 03/26/2019   HCT 31.9 (L) 03/26/2019   MCV 100.9 (H) 03/26/2019   PLT 47 (L) 03/26/2019   Lab Results  Component Value Date   IRON 122 02/13/2018   TIBC 287 02/13/2018   FERRITIN 103 02/13/2018   Assessment & Plan:  This visit occurred during the SARS-CoV-2 public health emergency.  Safety protocols were in place, including screening questions prior to the visit, additional usage of staff PPE, and extensive cleaning of exam room while observing appropriate contact time as indicated for disinfecting solutions.   Problem List Items Addressed This Visit    Thrombocytopenia ([place])   Smoker    Continues 1 ppd. Continue to encourage cessation.       Right hip pain   Pancytopenia ([place])    Update labs.       MDD (major depressive disorder), recurrent severe,  without psychosis ([place])    Continue cymbalta 4m daily - await latest MELD score. Given hepatic cirrhosis, may need to transition off cymbalta onto different antidepressant (consider celexa or lexapro).       General weakness   Cir[place]osis of liver with ascites ([place]    Update labs today. Reviewed spironolactone and lasix dosing. Reviewed recent weight log. Has GI f/u next week. Will be due for liver imaging (last done 03/2018).      Relevant Orders   Comprehensive metabolic panel   CBC with Differential   Ferritin   IBC panel   Protime-INR   Chronic pain syndrome    [place] Predominantly R hip arthritis, with lumbar pain and now abdominal discomfort      Aortic atherosclerosis ([facility])    Continue RYR.       Anemia    Cirrhosis related. Update iron panel.           No orders of the defined types were placed in this encounter.  Orders Placed This Encounter  Procedures  . Comprehensive metabolic panel  . CBC with Differential  . Ferritin  . IBC panel  . Protime-INR   Patient Instructions  Labs today.  Continue spironolactone and lasix 439monce daily. Take 2nd lasix dose if weight gain >3 pounds a day.  Keep track of weights and let me or heart doctor know.    Follow up plan: Return in about 3 months (around 07/26/2019) for follow up visit.  JaRia BushMD

## 2019-04-25 NOTE — Assessment & Plan Note (Signed)
Cirrhosis related. Update iron panel.

## 2019-04-25 NOTE — Assessment & Plan Note (Signed)
Continues 1 ppd. Continue to encourage cessation.

## 2019-04-25 NOTE — Assessment & Plan Note (Addendum)
Continue cymbalta 30m daily - await latest MELD score. Given hepatic cirrhosis, may need to transition off cymbalta onto different antidepressant (consider celexa or lexapro).

## 2019-04-25 NOTE — Assessment & Plan Note (Signed)
Continue RYR.

## 2019-04-25 NOTE — Assessment & Plan Note (Signed)
Followed by pain clinic in [place]. Predominantly R hip arthritis, with lumbar pain and now abdominal discomfort

## 2019-04-25 NOTE — Assessment & Plan Note (Addendum)
Update labs today. Reviewed spironolactone and lasix dosing. Reviewed recent weight log. Has GI f/u next week. Will be due for liver imaging (last done 03/2018).

## 2019-04-25 NOTE — Patient Instructions (Addendum)
Labs today.  Continue spironolactone and lasix 68m once daily. Take 2nd lasix dose if weight gain >3 pounds a day.  Keep track of weights and let me or heart doctor know.

## 2019-04-25 NOTE — Assessment & Plan Note (Signed)
Update labs.  

## 2019-04-28 DIAGNOSIS — K7581 Nonalcoholic steatohepatitis (NASH): Secondary | ICD-10-CM | POA: Diagnosis not present

## 2019-04-28 DIAGNOSIS — H9190 Unspecified hearing loss, unspecified ear: Secondary | ICD-10-CM | POA: Diagnosis not present

## 2019-04-28 DIAGNOSIS — K7469 Other cirrhosis of liver: Secondary | ICD-10-CM | POA: Diagnosis not present

## 2019-04-28 DIAGNOSIS — M1611 Unilateral primary osteoarthritis, right hip: Secondary | ICD-10-CM | POA: Diagnosis not present

## 2019-04-28 DIAGNOSIS — R188 Other ascites: Secondary | ICD-10-CM | POA: Diagnosis not present

## 2019-04-28 DIAGNOSIS — I255 Ischemic cardiomyopathy: Secondary | ICD-10-CM | POA: Diagnosis not present

## 2019-04-28 DIAGNOSIS — I2510 Atherosclerotic heart disease of native coronary artery without angina pectoris: Secondary | ICD-10-CM | POA: Diagnosis not present

## 2019-04-28 DIAGNOSIS — R296 Repeated falls: Secondary | ICD-10-CM | POA: Diagnosis not present

## 2019-04-28 DIAGNOSIS — G600 Hereditary motor and sensory neuropathy: Secondary | ICD-10-CM | POA: Diagnosis not present

## 2019-04-29 ENCOUNTER — Other Ambulatory Visit: Payer: Self-pay

## 2019-04-29 ENCOUNTER — Encounter: Payer: Self-pay | Admitting: Gastroenterology

## 2019-04-29 ENCOUNTER — Ambulatory Visit (INDEPENDENT_AMBULATORY_CARE_PROVIDER_SITE_OTHER): Payer: Medicare PPO | Admitting: Gastroenterology

## 2019-04-29 ENCOUNTER — Other Ambulatory Visit: Payer: Self-pay | Admitting: Family Medicine

## 2019-04-29 VITALS — BP 129/83 | HR 84 | Temp 98.2°F | Ht 70.0 in | Wt 253.6 lb

## 2019-04-29 DIAGNOSIS — K7290 Hepatic failure, unspecified without coma: Secondary | ICD-10-CM

## 2019-04-29 DIAGNOSIS — K7460 Unspecified cirrhosis of liver: Secondary | ICD-10-CM

## 2019-04-29 DIAGNOSIS — K921 Melena: Secondary | ICD-10-CM

## 2019-04-29 DIAGNOSIS — R188 Other ascites: Secondary | ICD-10-CM

## 2019-04-29 DIAGNOSIS — K760 Fatty (change of) liver, not elsewhere classified: Secondary | ICD-10-CM

## 2019-04-29 DIAGNOSIS — R933 Abnormal findings on diagnostic imaging of other parts of digestive tract: Secondary | ICD-10-CM

## 2019-04-29 DIAGNOSIS — K5900 Constipation, unspecified: Secondary | ICD-10-CM

## 2019-04-29 DIAGNOSIS — K7682 Hepatic encephalopathy: Secondary | ICD-10-CM

## 2019-04-29 MED ORDER — LACTULOSE 20 G PO PACK: 20.0000 g | PACK | Freq: Three times a day (TID) | ORAL | 5 refills | Status: DC

## 2019-04-29 MED ORDER — FERROUS SULFATE 325 (65 FE) MG PO TABS: 325.0000 mg | ORAL_TABLET | ORAL | Status: DC

## 2019-04-29 NOTE — Addendum Note (Signed)
Addended by: Dorethea Clan on: 04/29/2019 01:51 PM   Modules accepted: Orders

## 2019-04-29 NOTE — Progress Notes (Signed)
[name] MD, MRCP(U.K) [address]  [place]  [place], [place] [address]  Main: [phone]  Fax: [phone]   Primary Care Physician: Ria Bush, MD  Primary Gastroenterologist:  Dr. [name]   Chronic liver disease : hospital follow up   HPI: [name] is a 63 y.o. male   Summary of history :  He was initially referred and seen on 02/13/18 for cirrhosis of the liver . Not seen by me since 04/2018 - didn't follow up.He has been seen by [facility] in the past.. His Cirrhosis had been attriuted to NASH.He says he has had hepatitis B in the past- approximately 20 years back , says he has not been treated for the same . No tatoos, no alcohol , no military service, no blood transfusions. Used cocaine in his 20's, none recently. Weighed 370 lbs at his heaviest. Was always " big", he is not diabetic.   EGD 01/2017 -[facility], mild stricture was dilated.  [facility] USG 11/2017 : Small ascites, cirrhosis, Chronic dilation of CBD, mild dilation of PD, splenomegaly   CT angio of the abdomen 12/2017:Post endovascular repair of suprarenal abdominal aortic aneurysm without evidence of complication.Suspected hemodynamically significant stenosis involving the origin of the common hepatic artery, similar to the 12/2015 examination.Potential hemodynamically significant narrowing involving theproximal aspect of the right SFA.Findings of hepatic cirrhosis and portal venous hypertension with several varices about the gastroesophageal junction, splenomegaly and trace amount of intra-abdominal ascites. No discrete hyperenhancing hepatic lesions  02/20/18 ; EGD -normal -no eosinophils in esophagus.  Labs work 02/13/18 : Cr 1.03,Hep A ab positive , albumin 3.0 ,alk phos 149 , Hbsab/ag,HCV ab , ANA,negative .Ceruloplasmin ,alpha 1 AT,Iron studies - normal   Smooth muscle antibody positive , elevated immunoglobulins G, AMA,LKM ab negative 03/19/18 : liver biopsy : steatosis , focal plasma  cells, mild chronic cholestasis. Unmable to get a diagnosis due to burned out liver disease.    Interval history11/8/19 -04/29/2019 Recent hospital admission in 03/26/2019 for ascites, underwent paracentesis.  04/25/2019 : INR : 1.3 , MELD 10,Cr 0.94 Since his hospital discharge he has been stable in terms of his weight.  He does suffer from confusion.  Does not have a bowel movement daily.  He has some abdominal discomfort relieved after bowel movement.  He states that he has noticed black-colored stools for the past few weeks.  Never been black in the past.  Denies any NSAID use. Discussed at length to consume low salt in diet and avoid all processed food.  Taking Aldactone 100 mg and Lasix 40 mg/day.  Not on any lactulose. Current Outpatient Medications  Medication Sig Dispense Refill  . albuterol (PROVENTIL HFA;VENTOLIN HFA) 108 (90 Base) MCG/ACT inhaler Inhale 2 puffs into the lungs every 6 (six) hours as needed for wheezing or shortness of breath. 1 Inhaler 0  . albuterol (PROVENTIL) (2.5 MG/3ML) 0.083% nebulizer solution USE 1 VIAL PER NEBULIZER EVERY 6 HRS AS NEEDED FOR WHEEZING (Patient taking differently: Take 2.5 mg by nebulization every 6 (six) hours as needed for shortness of breath. ) 75 mL 6  . aspirin 81 MG tablet Take 1 tablet (81 mg total) by mouth daily.    . B Complex-C (SUPER B COMPLEX PO) Take 1 tablet by mouth daily.    . Ca Phosphate-Cholecalciferol [phone] MG-UNIT TABS Take 1 tablet by mouth daily.     . cholecalciferol 2000 units TABS Take 2,000 Units by mouth daily.    . Cobalamin Combinations (B-12) 1000-400  MCG SUBL Take 1 tablet by mouth daily.     [name] dicyclomine (BENTYL) 20 MG tablet TAKE 1 TABLET BY MOUTH 3 TIMES DAILY BEFORE MEALS. AS NEEDED FOR ABDOMINAL PAIN (Patient taking differently: Take 20 mg by mouth 4 (four) times daily -  before meals and at bedtime. ) 30 tablet 0  . DULoxetine (CYMBALTA) 60 MG capsule Take 1 capsule (60 mg total) by mouth daily. 90  capsule 3  . fentaNYL (DURAGESIC - DOSED MCG/HR) 50 MCG/HR Place 1 patch (50 mcg total) every other day onto the skin. 15 patch 0  . ferrous sulfate 325 (65 FE) MG tablet Take 1 tablet (325 mg total) by mouth every other day.    . fluticasone (FLONASE) 50 MCG/ACT nasal spray Place 2 sprays into both nostrils daily as needed for allergies.     . folic acid (FOLVITE) 1 MG tablet Take 1 tablet (1 mg total) by mouth daily. 90 tablet 3  . furosemide (LASIX) 40 MG tablet Take 1 tablet (40 mg total) by mouth daily. 30 tablet 1  . lactulose (CEPHULAC) 20 g packet Take 1 packet (20 g total) by mouth 3 (three) times daily. 30 each 0  . methocarbamol (ROBAXIN) 500 MG tablet TAKE 1 TABLET (500 MG TOTAL) BY MOUTH AT BEDTIME. 30 tablet 0  . metoprolol succinate (TOPROL-XL) 25 MG 24 hr tablet Take 0.5 tablets (12.5 mg total) by mouth daily. 30 tablet 1  . Misc Natural Products (TART CHERRY ADVANCED) CAPS Take 1 capsule by mouth 2 (two) times daily.    . nitroGLYCERIN (NITROSTAT) 0.4 MG SL tablet Place 1 tablet (0.4 mg total) under the tongue every 5 (five) minutes as needed for chest pain. 25 tablet 12  . nystatin cream (MYCOSTATIN) Apply 1 application topically 2 (two) times daily. 80 g 0  . omeprazole (PRILOSEC) 40 MG capsule TAKE 1 CAPSULE BY MOUTH EVERY DAY (Patient taking differently: Take 40 mg by mouth daily. ) 30 capsule 11  . ondansetron (ZOFRAN-ODT) 4 MG disintegrating tablet Take 1 tablet (4 mg total) by mouth every 8 (eight) hours as needed for nausea or vomiting. Day supply per insurance. 20 tablet 3  . oxyCODONE (ROXICODONE) 15 MG immediate release tablet Take 1 tablet (15 mg total) by mouth every 4 (four) hours as needed. (Patient taking differently: Take 90 mg by mouth daily. Take 6 tablets by mouth daily per patient) 90 tablet 0  . Red Yeast Rice 600 MG CAPS Take 2 capsules by mouth daily.    [name] spironolactone (ALDACTONE) 100 MG tablet Take 1 tablet (100 mg total) by mouth daily. 30 tablet 1  .  vitamin B-12 (CYANOCOBALAMIN) 500 MCG tablet Take 1 tablet (500 mcg total) by mouth every Monday, Wednesday, and Friday.     No current facility-administered medications for this visit.     Allergies as of 04/29/2019 - Review Complete 04/25/2019  Allergen Reaction Noted  . Statins Shortness Of Breath 01/22/2012  . Losartan Other (See Comments) 01/30/2013  . Penicillins  02/20/2018  . Tramadol Nausea Only 03/13/2017  . Allopurinol Nausea Only 12/18/2012  . Baclofen Nausea And Vomiting 06/23/2015  . Gabapentin Nausea And Vomiting 04/21/2015    ROS:  General: Negative for anorexia, weight loss, fever, chills, fatigue, weakness. ENT: Negative for hoarseness, difficulty swallowing , nasal congestion. CV: Negative for chest pain, angina, palpitations, dyspnea on exertion, peripheral edema.  Respiratory: Negative for dyspnea at rest, dyspnea on exertion, cough, sputum, wheezing.  GI: See history of present illness.  GU:  Negative for dysuria, hematuria, urinary incontinence, urinary frequency, nocturnal urination.  Endo: Negative for unusual weight change.    Physical Examination:   There were no vitals taken for this visit.  General: Appears comfortable in a wheelchair. Eyes: No icterus. Conjunctivae pink. Mouth: Oropharyngeal mucosa moist and pink , no lesions erythema or exudate. Lungs: Clear to auscultation bilaterally. Non-labored. Heart: Regular rate and rhythm, no murmurs rubs or gallops.  Abdomen: Bowel sounds are normal, nontender, nondistended, no hepatosplenomegaly or masses, no abdominal bruits or hernia , no rebound or guarding.   Extremities: No lower extremity edema. No clubbing or deformities. Neuro: Alert and oriented x 3.  Grossly intact. Skin: Warm and dry, no jaundice.   Psych: Alert and cooperative, normal mood and affect.   Imaging Studies: No results found.  Assessment and Plan:   [name] is a 63 y.o. y/o male *here to follow upfor liver  cirrhosis. In the past been felt by his last GI in 2018 was probably NASH related, autoimmune work upshows a positive smooth muscle ab and elevated immunoglobulins.MELD 10 Liver biopsy is inconclusive due to burnt out liver. Some hepatic encephalopathy likely secondary to constipation.   Ascites seems to be stable with Aldactone and Lasix.   Plan  1.  Continue Aldactone and Lasix at 100 mg and 40 mg expectedly..  2. MRI liver and pancreas to screen for [facility] and evaluate dilated PD and CBD 3. Low salt diet, low fat diet. 4. EGDdue to recent history of melena. 5. Stopped  Smoking/vaping-congratulated him  6. Latulose daily titrated to 1-2 soft bowel movements a day provide prescription. 7.  Advised to maintain chart for daily weights and I will speak to him in 10 days time.  If still encephalopathy will add Xifaxan at that time.   I have discussed alternative options, risks & benefits,  which include, but are not limited to, bleeding, infection, perforation,respiratory complication & drug reaction.  The patient agrees with this plan & written consent will be obtained.     Dr [name]  MD,MRCP [facility]) Follow up in telephone visit in 10 days

## 2019-04-30 ENCOUNTER — Telehealth: Payer: Self-pay | Admitting: Gastroenterology

## 2019-04-30 DIAGNOSIS — K7469 Other cirrhosis of liver: Secondary | ICD-10-CM | POA: Diagnosis not present

## 2019-04-30 DIAGNOSIS — H9190 Unspecified hearing loss, unspecified ear: Secondary | ICD-10-CM | POA: Diagnosis not present

## 2019-04-30 DIAGNOSIS — G600 Hereditary motor and sensory neuropathy: Secondary | ICD-10-CM | POA: Diagnosis not present

## 2019-04-30 DIAGNOSIS — K7581 Nonalcoholic steatohepatitis (NASH): Secondary | ICD-10-CM | POA: Diagnosis not present

## 2019-04-30 DIAGNOSIS — M1611 Unilateral primary osteoarthritis, right hip: Secondary | ICD-10-CM | POA: Diagnosis not present

## 2019-04-30 DIAGNOSIS — R296 Repeated falls: Secondary | ICD-10-CM | POA: Diagnosis not present

## 2019-04-30 DIAGNOSIS — I255 Ischemic cardiomyopathy: Secondary | ICD-10-CM | POA: Diagnosis not present

## 2019-04-30 DIAGNOSIS — I2510 Atherosclerotic heart disease of native coronary artery without angina pectoris: Secondary | ICD-10-CM | POA: Diagnosis not present

## 2019-04-30 DIAGNOSIS — R188 Other ascites: Secondary | ICD-10-CM | POA: Diagnosis not present

## 2019-04-30 LAB — CBC
Hematocrit: 36.3 % — ABNORMAL LOW (ref 37.5–51.0)
Hemoglobin: 12.3 g/dL — ABNORMAL LOW (ref 13.0–17.7)
MCH: 32.4 pg (ref 26.6–33.0)
MCHC: 33.9 g/dL (ref 31.5–35.7)
MCV: 96 fL (ref 79–97)
Platelets: 61 10*3/uL — CL (ref 150–450)
RBC: 3.8 x10E6/uL — ABNORMAL LOW (ref 4.14–5.80)
RDW: 14.7 % (ref 11.6–15.4)
WBC: 6.1 10*3/uL (ref 3.4–10.8)

## 2019-04-30 NOTE — Telephone Encounter (Signed)
[name](wife) called about patient stating she was retuning a call to [place].I called & l/m for her to call our office.

## 2019-05-03 DIAGNOSIS — G600 Hereditary motor and sensory neuropathy: Secondary | ICD-10-CM | POA: Diagnosis not present

## 2019-05-03 DIAGNOSIS — R296 Repeated falls: Secondary | ICD-10-CM | POA: Diagnosis not present

## 2019-05-03 DIAGNOSIS — R069 Unspecified abnormalities of breathing: Secondary | ICD-10-CM | POA: Diagnosis not present

## 2019-05-03 DIAGNOSIS — M1611 Unilateral primary osteoarthritis, right hip: Secondary | ICD-10-CM | POA: Diagnosis not present

## 2019-05-05 ENCOUNTER — Other Ambulatory Visit: Admission: RE | Admit: 2019-05-05 | Payer: Medicare PPO | Source: Ambulatory Visit

## 2019-05-05 ENCOUNTER — Telehealth: Payer: Self-pay | Admitting: *Deleted

## 2019-05-05 DIAGNOSIS — G600 Hereditary motor and sensory neuropathy: Secondary | ICD-10-CM | POA: Diagnosis not present

## 2019-05-05 DIAGNOSIS — I2510 Atherosclerotic heart disease of native coronary artery without angina pectoris: Secondary | ICD-10-CM | POA: Diagnosis not present

## 2019-05-05 DIAGNOSIS — R188 Other ascites: Secondary | ICD-10-CM | POA: Diagnosis not present

## 2019-05-05 DIAGNOSIS — H9190 Unspecified hearing loss, unspecified ear: Secondary | ICD-10-CM | POA: Diagnosis not present

## 2019-05-05 DIAGNOSIS — K7581 Nonalcoholic steatohepatitis (NASH): Secondary | ICD-10-CM | POA: Diagnosis not present

## 2019-05-05 DIAGNOSIS — I255 Ischemic cardiomyopathy: Secondary | ICD-10-CM | POA: Diagnosis not present

## 2019-05-05 DIAGNOSIS — M1611 Unilateral primary osteoarthritis, right hip: Secondary | ICD-10-CM | POA: Diagnosis not present

## 2019-05-05 DIAGNOSIS — K7469 Other cirrhosis of liver: Secondary | ICD-10-CM | POA: Diagnosis not present

## 2019-05-05 DIAGNOSIS — R296 Repeated falls: Secondary | ICD-10-CM | POA: Diagnosis not present

## 2019-05-05 NOTE — Telephone Encounter (Signed)
Samantha PTA with [facility] left a voicemail stating that she was seeing the patient today and he had a fall outside of his home yesterday. Aldona Bar stated that the patient fell going up his ramp in his power chair. Aldona Bar stated that the patientt fell on his right side, but was not injured. Aldona Bar stated that the patient was up walking around today. Aldona Bar stated that she is required to advise PCP of any falls.

## 2019-05-05 NOTE — Telephone Encounter (Signed)
Noted! Thank you

## 2019-05-06 ENCOUNTER — Telehealth: Payer: Self-pay

## 2019-05-06 ENCOUNTER — Encounter: Payer: Self-pay | Admitting: Gastroenterology

## 2019-05-06 NOTE — Telephone Encounter (Signed)
Called pt's wife to inform her of pt MRI appointment information  Unable to contact, left detailed VM

## 2019-05-07 ENCOUNTER — Telehealth: Payer: Self-pay

## 2019-05-07 DIAGNOSIS — I2510 Atherosclerotic heart disease of native coronary artery without angina pectoris: Secondary | ICD-10-CM | POA: Diagnosis not present

## 2019-05-07 DIAGNOSIS — R188 Other ascites: Secondary | ICD-10-CM | POA: Diagnosis not present

## 2019-05-07 DIAGNOSIS — M1611 Unilateral primary osteoarthritis, right hip: Secondary | ICD-10-CM | POA: Diagnosis not present

## 2019-05-07 DIAGNOSIS — G600 Hereditary motor and sensory neuropathy: Secondary | ICD-10-CM | POA: Diagnosis not present

## 2019-05-07 DIAGNOSIS — I255 Ischemic cardiomyopathy: Secondary | ICD-10-CM | POA: Diagnosis not present

## 2019-05-07 DIAGNOSIS — K7581 Nonalcoholic steatohepatitis (NASH): Secondary | ICD-10-CM | POA: Diagnosis not present

## 2019-05-07 DIAGNOSIS — H9190 Unspecified hearing loss, unspecified ear: Secondary | ICD-10-CM | POA: Diagnosis not present

## 2019-05-07 DIAGNOSIS — K7469 Other cirrhosis of liver: Secondary | ICD-10-CM | POA: Diagnosis not present

## 2019-05-07 DIAGNOSIS — R296 Repeated falls: Secondary | ICD-10-CM | POA: Diagnosis not present

## 2019-05-07 NOTE — Telephone Encounter (Signed)
Trish contacted me in regards to patient not having COVID Test.  I LVM for pt to call office back in regards to his EGD scheduled for tomorrow.  Informed him that due to COVID test not being completed we will need to reschedule his EGD to another date, and to please call back to reschedule it.   Thanks, Sharyn Lull  Procedure: EGD Physician: Dr. Vicente Males Dx: K92.1 (ICD-10-CM) - [place]ena K74.60,R18.8 (ICD-10-CM) - Cirrhosis of liver with ascites, unspecified hepatic cirrhosis type ([place])

## 2019-05-07 NOTE — Telephone Encounter (Signed)
Noted. Thank you . This has been reschedule.d

## 2019-05-07 NOTE — Telephone Encounter (Signed)
Patients wife contacted the office to reschedule her husbands EGD.  EGD has been rescheduled to Monday 05/12/19 with Dr. Vicente Males. Advised wife to make sure her husband has his COVID test tomorrow to keep his EGD as scheduled.  Reason for not having COVID test: Wife states her husband fell in the wheelchair in the yard.  She said he was a little shaken up but no injuries.  Thanks [employer]

## 2019-05-07 NOTE — Telephone Encounter (Signed)
Samantha PTA with [facility] left v/m that since 05/05/19 pt has gained 5 lbs. Aldona Bar wants to know if any orders to take extra fluid pill. Samantha request cb.

## 2019-05-07 NOTE — Telephone Encounter (Signed)
Dr Ria Bush, MD Juluis Rainier

## 2019-05-07 NOTE — Telephone Encounter (Signed)
Ensure he continues taking lasix 15m daily and spironolactone 1010mdaily. If so, increase lasix to 4088mid for 3 days then call us [place] new weight.

## 2019-05-08 ENCOUNTER — Other Ambulatory Visit: Payer: Self-pay

## 2019-05-08 ENCOUNTER — Other Ambulatory Visit
Admission: RE | Admit: 2019-05-08 | Discharge: 2019-05-08 | Disposition: A | Discharge status: Home / Self Care | Payer: Medicare PPO | Source: Ambulatory Visit | Attending: Gastroenterology | Admitting: Gastroenterology

## 2019-05-08 ENCOUNTER — Telehealth: Payer: Self-pay | Admitting: Family Medicine

## 2019-05-08 DIAGNOSIS — Z20828 Contact with and (suspected) exposure to other viral communicable diseases: Secondary | ICD-10-CM | POA: Insufficient documentation

## 2019-05-08 DIAGNOSIS — Z01812 Encounter for preprocedural laboratory examination: Secondary | ICD-10-CM | POA: Diagnosis not present

## 2019-05-08 NOTE — Telephone Encounter (Signed)
That was my signature, I did not circle 2.  New form placed in LIsa's box.

## 2019-05-08 NOTE — Telephone Encounter (Signed)
Left message on vm per dpr relaying Dr. Synthia Innocent message and notifying pt form is ready to pick up.  [Placed form at front office.]

## 2019-05-08 NOTE — Telephone Encounter (Signed)
Pt's wife need form to be filled out with only one circled not two for pt getting disability parking placard. Placed form in RX tower for Dr. Darnell Level.

## 2019-05-08 NOTE — Telephone Encounter (Signed)
Lvm relaying Dr. Synthia Innocent message.    Also, spoke with pt's wife, [name] (on dpr), relaying Dr. Synthia Innocent message.  She confirms pt is taking both meds daily.  She will increase his Lasix as instructed and call back with new wt.

## 2019-05-08 NOTE — Telephone Encounter (Signed)
Placed form in Dr. Synthia Innocent box.

## 2019-05-09 LAB — SARS CORONAVIRUS 2 (TAT 6-24 HRS): SARS Coronavirus 2: NEGATIVE

## 2019-05-12 ENCOUNTER — Encounter: Payer: Self-pay | Admitting: *Deleted

## 2019-05-12 ENCOUNTER — Ambulatory Visit: Payer: Medicare PPO | Admitting: Certified Registered"

## 2019-05-12 ENCOUNTER — Encounter
Admission: RE | Disposition: A | Discharge status: Home / Self Care | Payer: Self-pay | Source: Home / Self Care | Attending: Gastroenterology

## 2019-05-12 ENCOUNTER — Ambulatory Visit
Admission: RE | Admit: 2019-05-12 | Discharge: 2019-05-12 | Disposition: A | Discharge status: Home / Self Care | Payer: Medicare PPO | Attending: Gastroenterology | Admitting: Gastroenterology

## 2019-05-12 DIAGNOSIS — H269 Unspecified cataract: Secondary | ICD-10-CM | POA: Diagnosis not present

## 2019-05-12 DIAGNOSIS — K219 Gastro-esophageal reflux disease without esophagitis: Secondary | ICD-10-CM | POA: Insufficient documentation

## 2019-05-12 DIAGNOSIS — J449 Chronic obstructive pulmonary disease, unspecified: Secondary | ICD-10-CM | POA: Diagnosis not present

## 2019-05-12 DIAGNOSIS — E1136 Type 2 diabetes mellitus with diabetic cataract: Secondary | ICD-10-CM | POA: Insufficient documentation

## 2019-05-12 DIAGNOSIS — Z888 Allergy status to other drugs, medicaments and biological substances status: Secondary | ICD-10-CM | POA: Insufficient documentation

## 2019-05-12 DIAGNOSIS — G600 Hereditary motor and sensory neuropathy: Secondary | ICD-10-CM | POA: Diagnosis not present

## 2019-05-12 DIAGNOSIS — K766 Portal hypertension: Secondary | ICD-10-CM

## 2019-05-12 DIAGNOSIS — K921 Melena: Secondary | ICD-10-CM | POA: Diagnosis not present

## 2019-05-12 DIAGNOSIS — Z79899 Other long term (current) drug therapy: Secondary | ICD-10-CM | POA: Insufficient documentation

## 2019-05-12 DIAGNOSIS — M1610 Unilateral primary osteoarthritis, unspecified hip: Secondary | ICD-10-CM | POA: Insufficient documentation

## 2019-05-12 DIAGNOSIS — F17290 Nicotine dependence, other tobacco product, uncomplicated: Secondary | ICD-10-CM | POA: Insufficient documentation

## 2019-05-12 DIAGNOSIS — G894 Chronic pain syndrome: Secondary | ICD-10-CM | POA: Diagnosis not present

## 2019-05-12 DIAGNOSIS — G4733 Obstructive sleep apnea (adult) (pediatric): Secondary | ICD-10-CM | POA: Diagnosis not present

## 2019-05-12 DIAGNOSIS — E119 Type 2 diabetes mellitus without complications: Secondary | ICD-10-CM | POA: Diagnosis not present

## 2019-05-12 DIAGNOSIS — K7581 Nonalcoholic steatohepatitis (NASH): Secondary | ICD-10-CM | POA: Diagnosis not present

## 2019-05-12 DIAGNOSIS — Z7982 Long term (current) use of aspirin: Secondary | ICD-10-CM | POA: Insufficient documentation

## 2019-05-12 DIAGNOSIS — E669 Obesity, unspecified: Secondary | ICD-10-CM | POA: Diagnosis not present

## 2019-05-12 DIAGNOSIS — Z6836 Body mass index (BMI) 36.0-36.9, adult: Secondary | ICD-10-CM | POA: Diagnosis not present

## 2019-05-12 DIAGNOSIS — M109 Gout, unspecified: Secondary | ICD-10-CM | POA: Diagnosis not present

## 2019-05-12 DIAGNOSIS — E785 Hyperlipidemia, unspecified: Secondary | ICD-10-CM | POA: Diagnosis not present

## 2019-05-12 DIAGNOSIS — I252 Old myocardial infarction: Secondary | ICD-10-CM | POA: Diagnosis not present

## 2019-05-12 DIAGNOSIS — I2510 Atherosclerotic heart disease of native coronary artery without angina pectoris: Secondary | ICD-10-CM | POA: Diagnosis not present

## 2019-05-12 DIAGNOSIS — Z955 Presence of coronary angioplasty implant and graft: Secondary | ICD-10-CM | POA: Insufficient documentation

## 2019-05-12 DIAGNOSIS — K3189 Other diseases of stomach and duodenum: Secondary | ICD-10-CM | POA: Diagnosis not present

## 2019-05-12 DIAGNOSIS — Z880 Allergy status to penicillin: Secondary | ICD-10-CM | POA: Diagnosis not present

## 2019-05-12 DIAGNOSIS — I10 Essential (primary) hypertension: Secondary | ICD-10-CM | POA: Insufficient documentation

## 2019-05-12 DIAGNOSIS — I255 Ischemic cardiomyopathy: Secondary | ICD-10-CM | POA: Diagnosis not present

## 2019-05-12 HISTORY — PX: ESOPHAGOGASTRODUODENOSCOPY (EGD) WITH PROPOFOL: SHX5813

## 2019-05-12 SURGERY — ESOPHAGOGASTRODUODENOSCOPY (EGD) WITH PROPOFOL
Anesthesia: General

## 2019-05-12 MED ORDER — LIDOCAINE HCL (CARDIAC) PF 100 MG/5ML IV SOSY
PREFILLED_SYRINGE | INTRAVENOUS | Status: DC | PRN
  Administered 2019-05-12: 40 mg via INTRAVENOUS

## 2019-05-12 MED ORDER — PROPOFOL 10 MG/ML IV BOLUS
INTRAVENOUS | Status: DC | PRN
  Administered 2019-05-12: 20 mg via INTRAVENOUS
  Administered 2019-05-12: 10 mg via INTRAVENOUS
  Administered 2019-05-12 (×2): 20 mg via INTRAVENOUS
  Administered 2019-05-12: 10 mg via INTRAVENOUS
  Administered 2019-05-12: 20 mg via INTRAVENOUS

## 2019-05-12 MED ORDER — LIDOCAINE HCL (PF) 2 % IJ SOLN
INTRAMUSCULAR | Status: AC
  Filled 2019-05-12: qty 10

## 2019-05-12 MED ORDER — PROPOFOL 10 MG/ML IV BOLUS
INTRAVENOUS | Status: AC
  Filled 2019-05-12: qty 20

## 2019-05-12 MED ORDER — SODIUM CHLORIDE 0.9 % IV SOLN
INTRAVENOUS | Status: DC
  Administered 2019-05-12: 11:00:00 via INTRAVENOUS

## 2019-05-12 NOTE — Anesthesia Post-op Follow-up Note (Signed)
Anesthesia QCDR form completed.        

## 2019-05-12 NOTE — Transfer of Care (Signed)
Immediate Anesthesia Transfer of Care Note  Patient: [name]  Procedure(s) Performed: ESOPHAGOGASTRODUODENOSCOPY (EGD) WITH PROPOFOL (N/A )  Patient Location: PACU and Endoscopy Unit  Anesthesia Type:General  Level of Consciousness: drowsy  Airway & Oxygen Therapy: Patient Spontanous Breathing and Patient connected to face mask oxygen  Post-op Assessment: Report given to RN and Post -op Vital signs reviewed and stable  Post vital signs: Reviewed and stable  Last Vitals:  Vitals Value Taken Time  BP 126/70 05/12/19 1138  Temp    Pulse 75 05/12/19 1138  Resp 15 05/12/19 1138  SpO2 100 % 05/12/19 1138  Vitals shown include unvalidated device data.  Last Pain:  Vitals:   05/12/19 1045  TempSrc: Temporal  PainSc: 0-No pain         Complications: No apparent anesthesia complications

## 2019-05-12 NOTE — Anesthesia Preprocedure Evaluation (Signed)
Anesthesia Evaluation  Patient identified by MRN, date of birth, ID band Patient awake    Reviewed: Allergy & Precautions, H&P , NPO status , Patient's Chart, lab work & pertinent test results  Airway Mallampati: II  TM Distance: >3 FB     Dental  (+) Chipped, Edentulous Upper   Pulmonary sleep apnea (pt denies this) , COPD, Current Smoker and Patient abstained from smoking.,           Cardiovascular hypertension, + CAD, + Past MI, +CHF and + DOE       Neuro/Psych  Headaches, PSYCHIATRIC DISORDERS Depression    GI/Hepatic GERD  Controlled,(+) Cirrhosis       , Hepatitis -  Endo/Other  diabetes  Renal/GU negative Renal ROS  negative genitourinary   Musculoskeletal   Abdominal   Peds  Hematology  (+) Blood dyscrasia, anemia , Chronic thrombocytopenia   Anesthesia Other Findings Past Medical History: 09/2012--  monitored by dr Trula Slade: AAA (abdominal aortic aneurysm)  ([place])     Comment:  stable 5.6cm CTA abdomen 2016 07/09/2016: Abnormal drug screen     Comment:  1/2/018 - positive oxycodone, fentanyl, inapprop               positive MJ - mod risk No date: Allergic rhinitis 03/2019: Ascites No date: B12 deficiency cardiologist-  dr Stanford Breed: CAD (coronary artery disease)     Comment:  x3 with stents last 2005, EF 40%, predominantly RCA by               CT 2016 No date: Cataracts, bilateral 05/2010: Cervical spondylosis     Comment:  s/p surgery dx  1975: Charcot Marie Tooth muscular atrophy     Comment:  neurologist--  dr love--  type 2 per pt No date: Chronic pain syndrome     Comment:  established with [facility] ([place]) -->               disagreement and transfered care to Dr Sanjuan Dame at [facility]               pain clinic [facility], requests PCP write Rx but f/u with               pain clinic Q6-12 months 10/2011: COPD (chronic obstructive pulmonary disease) ([place])     Comment:  minimal by PFTs No  date: DDD (degenerative disc disease) No date: Disturbances of sensation of smell and taste     Comment:  improving No date: Dyspnea on exertion No date: GERD (gastroesophageal reflux disease) No date: Gout No date: Headache No date: Hepatitis     Comment:  hepatitis B No date: Hidradenitis     Comment:  right groin dx 2011   goin and leg crease: Hidradenitis suppurativa     Comment:  followd by Lyndle Herrlich - daily bactrim, s/p               intralesional steroid injection 10/2010 No date: Hip osteoarthritis     Comment:  s/p intraarticular steroid shot (12/2012)               ([place]aff[place]) 1983: History of hepatitis B No date: History of MI (myocardial infarction)     Comment:  2000  &  2005 No date: History of pneumonia 2000: History of viral meningitis No date: HLD (hyperlipidemia) No date: HTN (hypertension) No date: Ischemic cardiomyopathy     Comment:  s/p inferior MI  --  current ef per myoview 39%  01/2014: Liver cirrhosis secondary to NASH [facility])     Comment:  by CT scan, rec virtual colonoscopy by Dr Collene Mares 06/2014 No date: Lumbar herniated disc No date: Myocardial infarction [facility])     Comment:  x2 No date: Nocturia more than twice per night No date: Obesity No date: Spinal stenosis     Comment:  released from [place].  established with preferred pain               (07/2013) No date: T2DM (type 2 diabetes mellitus) ([place])     Comment:  ABIs WNL 2016 No date: Vitamin D deficiency  Past Surgical History: 11/30/2015: ABDOMINAL AORTIC ENDOVASCULAR FENESTRATED STENT GRAFT; N/A     Comment:  Procedure: ABDOMINAL AORTIC ENDOVASCULAR FENESTRATED               STENT GRAFT;  Surgeon: Serafina Mitchell, MD;  Location: [place]              OR;  Service: Vascular;  Laterality: N/A; 01-07-2010 : ANTERIOR CERVICAL DECOMP/DISCECTOMY FUSION     Comment:  C4 -- C7 03-30-2005  dr Albertine Patricia: CARDIAC CATHETERIZATION     Comment:  ef 40% w/ inferior akinesis/  LM and CFX                angiographically normal/  pLAD 30%/   Widely patent               stents in RCA and PDA widely patent 10-23-2012  dr Stanford Breed: CARDIOVASCULAR STRESS TEST     Comment:  No ischemia/  Moderate scar in the inferior wall,               otherwise normal perfusion/  LV ef 39%,  LV wall motion:               inferior/ inferolateral hypokinesis 05/06/2007: COLONOSCOPY     Comment:  normal, small int hemorrhoids rpt 5 yrs due to fmhx -               rec against rpt colonoscopy by Dr Collene Mares 2000  dr Stanford Breed: CORONARY ANGIOPLASTY     Comment:  PCI to RCA and PDA 03-19-2005  dr Gwyndolyn Saxon downey: CORONARY [place]:  inferior STEMI--- DES x4 to RCA w/ balloon angioplasty               and balloon angioplasty to jailed PDA ostium/  severe               hypokinesis of midinferor wall, ef 50%/  dLM 20%,  mLAD               20%,  dCFX 60% 01/2017: ESOPHAGOGASTRODUODENOSCOPY     Comment:  dilated benign esophageal stenosis, portal hypertensive               gastropathy Henrene Pastor) 02/20/2018: ESOPHAGOGASTRODUODENOSCOPY (EGD) WITH PROPOFOL; N/A     Comment:  benign biopsy [name], MD) 12/31/2014: HYDRADENITIS EXCISION; Right     Comment:  Procedure: WIDE EXCISION HIDRADENITIS GROIN; Coralie Keens, MD 03/25/2019: IR PARACENTESIS No date: LUMBAR DISC SURGERY     Comment:  L5-S1 05-18-2010: LUMBAR LAMINECTOMY     Comment:  L2--5   laminectomy/foraminotomy for stenosis ([place]) No date: MYELOGRAM     Comment:  L5-S1 and L1-2 spondylosis 10/2013: SACROILIAC JOINT INJECTION; Bilateral  Comment:  [place] 1972: TONSILLECTOMY AND ADENOIDECTOMY  BMI    Body Mass Index: 36.39 kg/m      Reproductive/Obstetrics negative OB ROS                            Anesthesia Physical Anesthesia Plan  ASA: IV  Anesthesia Plan: General   Post-op Pain Management:    Induction:   PONV Risk Score and Plan: Propofol infusion and  TIVA  Airway Management Planned: Simple Face Mask and Natural Airway  Additional Equipment:   Intra-op Plan:   Post-operative Plan:   Informed Consent: I have reviewed the patients History and Physical, chart, labs and discussed the procedure including the risks, benefits and alternatives for the proposed anesthesia with the patient or authorized representative who has indicated his/her understanding and acceptance.     Dental Advisory Given  Plan Discussed with: Anesthesiologist  Anesthesia Plan Comments:        Anesthesia Quick Evaluation

## 2019-05-12 NOTE — H&P (Signed)
Miguel Bellows, MD [address], [place], [place], [place], [address] [address], [place], [place], [place], [address] Phone: [phone]  Fax: [phone]  Primary Care Physician:  Ria Bush, MD   Pre-Procedure History & Physical: HPI:  ARYA BOXLEY is a 63 y.o. male is here for an endoscopy    Past Medical History:  Diagnosis Date  . AAA (abdominal aortic aneurysm) ([place]) 09/2012--  monitored by dr Trula Slade   stable 5.6cm CTA abdomen 2016  . Abnormal drug screen 07/09/2016   1/2/018 - positive oxycodone, fentanyl, inapprop positive MJ - mod risk  . Allergic rhinitis   . Ascites 03/2019  . B12 deficiency   . CAD (coronary artery disease) cardiologist-  dr Stanford Breed   x3 with stents last 2005, EF 40%, predominantly RCA by CT 2016  . Cataracts, bilateral   . Cervical spondylosis 05/2010   s/p surgery  . Charcot Marie Tooth muscular atrophy dx  [phone]   neurologist--  dr love--  type 2 per pt  . Chronic pain syndrome    established with Preferred pain clinic ([place]) --> disagreement and transfered care to Dr Sanjuan Dame at [facility] pain clinic [facility], requests PCP write Rx but f/u with pain clinic Q6-12 months  . COPD (chronic obstructive pulmonary disease) ([place]) 10/2011   minimal by PFTs  . DDD (degenerative disc disease)   . Disturbances of sensation of smell and taste    improving  . Dyspnea on exertion   . GERD (gastroesophageal reflux disease)   . Gout   . Headache   . Hepatitis    hepatitis B  . Hidradenitis    right groin  . Hidradenitis suppurativa dx 2011   goin and leg crease   followd by Lyndle Herrlich - daily bactrim, s/p intralesional steroid injection 10/2010  . Hip osteoarthritis    s/p intraarticular steroid shot (12/2012) ([place]aff[place])  . History of hepatitis B 1983  . History of MI (myocardial infarction)    2000  &  2005  . History of pneumonia   . History of viral meningitis 2000  . HLD (hyperlipidemia)   . HTN (hypertension)   .  Ischemic cardiomyopathy    s/p inferior MI  --  current ef per myoview 39%  . Liver cirrhosis secondary to NASH ([place]) 01/2014   by CT scan, rec virtual colonoscopy by Dr Collene Mares 06/2014  . Lumbar herniated disc   . Myocardial infarction ([place])    x2  . Nocturia more than twice per night   . Obesity   . Spinal stenosis    released from [place].  established with preferred pain (07/2013)  . T2DM (type 2 diabetes mellitus) ([place])    ABIs WNL 2016  . Vitamin D deficiency     Past Surgical History:  Procedure Laterality Date  . ABDOMINAL AORTIC ENDOVASCULAR FENESTRATED STENT GRAFT N/A 11/30/2015   Procedure: ABDOMINAL AORTIC ENDOVASCULAR FENESTRATED STENT GRAFT;  Surgeon: Serafina Mitchell, MD;  Location: [place];  Service: Vascular;  Laterality: N/A;  . ANTERIOR CERVICAL DECOMP/DISCECTOMY FUSION  01-07-2010    C4 -- C7  . CARDIAC CATHETERIZATION  03-30-2005  dr Albertine Patricia   ef 40% w/ inferior akinesis/  LM and CFX angiographically normal/  pLAD 30%/   Widely patent stents in RCA and PDA widely patent  . CARDIOVASCULAR STRESS TEST  10-23-2012  dr Stanford Breed   No ischemia/  Moderate scar in the inferior wall, otherwise normal perfusion/  LV ef 39%,  LV  wall motion: inferior/ inferolateral hypokinesis  . COLONOSCOPY  05/06/2007   normal, small int hemorrhoids rpt 5 yrs due to fmhx - rec against rpt colonoscopy by Dr Collene Mares  . CORONARY ANGIOPLASTY  2000  dr Stanford Breed   PCI to RCA and PDA  . CORONARY ANGIOPLASTY WITH STENT PLACEMENT  03-19-2005  dr Gwyndolyn Saxon downey   inferior STEMI--- DES x4 to RCA w/ balloon angioplasty and balloon angioplasty to jailed PDA ostium/  severe hypokinesis of midinferor wall, ef 50%/  dLM 20%,  mLAD 20%,  dCFX 60%  . ESOPHAGOGASTRODUODENOSCOPY  01/2017   dilated benign esophageal stenosis, portal hypertensive gastropathy Henrene Pastor)  . ESOPHAGOGASTRODUODENOSCOPY (EGD) WITH PROPOFOL N/A 02/20/2018   benign biopsy Miguel Bellows, MD)  . HYDRADENITIS EXCISION Right 12/31/2014   Procedure: WIDE  EXCISION HIDRADENITIS GROIN; Coralie Keens, MD  . IR PARACENTESIS  03/25/2019  . LUMBAR DISC SURGERY     L5-S1  . LUMBAR LAMINECTOMY  05-18-2010   L2--5   laminectomy/foraminotomy for stenosis Joya Salm)  . MYELOGRAM     L5-S1 and L1-2 spondylosis  . SACROILIAC JOINT INJECTION Bilateral 10/2013   [place]  . TONSILLECTOMY AND ADENOIDECTOMY  1972    Prior to Admission medications   Medication Sig Start Date End Date Taking? Authorizing Provider  albuterol (PROVENTIL HFA;VENTOLIN HFA) 108 (90 Base) MCG/ACT inhaler Inhale 2 puffs into the lungs every 6 (six) hours as needed for wheezing or shortness of breath. 08/17/15   Jearld Fenton, NP  albuterol (PROVENTIL) (2.5 MG/3ML) 0.083% nebulizer solution USE 1 VIAL PER NEBULIZER EVERY 6 HRS AS NEEDED FOR WHEEZING Patient taking differently: Take 2.5 mg by nebulization every 6 (six) hours as needed for shortness of breath.  07/19/15   Ria Bush, MD  aspirin 81 MG tablet Take 1 tablet (81 mg total) by mouth daily. 10/18/18   Lelon Perla, MD  B Complex-C (SUPER B COMPLEX PO) Take 1 tablet by mouth daily.    [provider]  Ca Phosphate-Cholecalciferol [phone] MG-UNIT TABS Take 1 tablet by mouth daily.  04/29/16   [provider]  cholecalciferol 2000 units TABS Take 2,000 Units by mouth daily. 04/29/16   Ria Bush, MD  Cobalamin Combinations (B-12) 1000-400 MCG SUBL Take 1 tablet by mouth daily.     [provider]  dicyclomine (BENTYL) 20 MG tablet TAKE 1 TABLET BY MOUTH 3 TIMES DAILY BEFORE MEALS. AS NEEDED FOR ABDOMINAL PAIN Patient taking differently: Take 20 mg by mouth 4 (four) times daily -  before meals and at bedtime.  06/24/18   Ria Bush, MD  DULoxetine (CYMBALTA) 60 MG capsule Take 1 capsule (60 mg total) by mouth daily. 12/04/18   Ria Bush, MD  fentaNYL (DURAGESIC - DOSED MCG/HR) 50 MCG/HR Place 1 patch (50 mcg total) every other day onto the skin. 04/19/17   Ria Bush,  MD  ferrous sulfate 325 (65 FE) MG tablet Take 1 tablet (325 mg total) by mouth every other day. 04/29/19   Ria Bush, MD  fluticasone [facility]) 50 MCG/ACT nasal spray Place 2 sprays into both nostrils daily as needed for allergies.  02/09/12   Ria Bush, MD  folic acid (FOLVITE) 1 MG tablet Take 1 tablet (1 mg total) by mouth daily. 12/26/18   Ria Bush, MD  furosemide (LASIX) 40 MG tablet Take 1 tablet (40 mg total) by mouth daily. 03/26/19   Black, Lezlie Octave, NP  lactulose (CEPHULAC) 20 g packet Take 1 packet (20 g total) by mouth 3 (three) times  daily. 04/29/19 10/26/19  Miguel Bellows, MD  methocarbamol (ROBAXIN) 500 MG tablet TAKE 1 TABLET (500 MG TOTAL) BY MOUTH AT BEDTIME. 04/13/19   Ria Bush, MD  metoprolol succinate (TOPROL-XL) 25 MG 24 hr tablet Take 0.5 tablets (12.5 mg total) by mouth daily. 03/26/19   Black, Lezlie Octave, NP  Misc Natural Products (TART CHERRY ADVANCED) CAPS Take 1 capsule by mouth 2 (two) times daily.    [provider]  nitroGLYCERIN (NITROSTAT) 0.4 MG SL tablet Place 1 tablet (0.4 mg total) under the tongue every 5 (five) minutes as needed for chest pain. 10/10/12   Lelon Perla, MD  nystatin cream (MYCOSTATIN) Apply 1 application topically 2 (two) times daily. 02/05/19   Ria Bush, MD  omeprazole (PRILOSEC) 40 MG capsule TAKE 1 CAPSULE BY MOUTH EVERY DAY Patient taking differently: Take 40 mg by mouth daily.  12/12/18   Ria Bush, MD  ondansetron (ZOFRAN-ODT) 4 MG disintegrating tablet Take 1 tablet (4 mg total) by mouth every 8 (eight) hours as needed for nausea or vomiting. Day supply per insurance. 02/05/19   Ria Bush, MD  oxyCODONE (ROXICODONE) 15 MG immediate release tablet Take 1 tablet (15 mg total) by mouth every 4 (four) hours as needed. Patient taking differently: Take 90 mg by mouth daily. Take 6 tablets by mouth daily per patient 02/27/19   Ria Bush, MD  Red Yeast Rice 600 MG CAPS Take 2 capsules  by mouth daily.    [provider]  spironolactone (ALDACTONE) 100 MG tablet Take 1 tablet (100 mg total) by mouth daily. 03/26/19   Radene Gunning, NP  vitamin B-12 (CYANOCOBALAMIN) 500 MCG tablet Take 1 tablet (500 mcg total) by mouth every Monday, Wednesday, and Friday. 12/27/18   Ria Bush, MD    Allergies as of 04/30/2019 - Review Complete 04/29/2019  Allergen Reaction Noted  . Statins Shortness Of Breath 01/22/2012  . Losartan Other (See Comments) 01/30/2013  . Penicillins  02/20/2018  . Tramadol Nausea Only 03/13/2017  . Allopurinol Nausea Only 12/18/2012  . Baclofen Nausea And Vomiting 06/23/2015  . Gabapentin Nausea And Vomiting 04/21/2015    Family History  Problem Relation Age of Onset  . Cancer Mother        colon  . Diabetes Mother   . Kidney disease Mother   . Aneurysm Mother        AAA  . Rheum arthritis Mother   . Charcot-Marie-Tooth disease Mother   . Heart disease Mother        before age 75  . Cancer Father        skin  . Heart attack Father   . Heart disease Father        before age 27  . Cancer Brother        skin  . Coronary artery disease Brother   . Cancer Brother        small cell lung cancer  . Aneurysm Brother        AAA  . Rheum arthritis Sister   . Rheum arthritis Brother   . Prostate cancer Neg Hx   . Bladder Cancer Neg Hx   . Kidney cancer Neg Hx     Social History   Socioeconomic History  . Marital status: Married    Spouse name: [name]  . Number of children: 0  . Years of education: College  . Highest education level: Not on file  Occupational History  . Occupation: Neurosurgeon implants, [employer] and  bridge-now disability 2006    Employer: UNEMPLOYED  Social Needs  . Financial resource strain: Not on file  . Food insecurity    Worry: Patient refused    Inability: Patient refused  . Transportation needs    Medical: Not on file    Non-medical: Not on file  Tobacco Use  . Smoking status: Current Some  Day Smoker    Packs/day: 1.00    Years: 57.00    Pack years: 57.00    Types: Cigarettes, E-cigarettes    Start date: 06/06/1967  . Smokeless tobacco: Never Used  . Tobacco comment: stopped smoking a pipe in 2015  DOES SMOKE E CIG  Substance and Sexual Activity  . Alcohol use: No    Alcohol/week: 0.0 standard drinks  . Drug use: Yes    Types: Fentanyl, Hydrocodone  . Sexual activity: Not on file  Lifestyle  . Physical activity    Days per week: Patient refused    Minutes per session: Patient refused  . Stress: Not on file  Relationships  . Social Herbalist on phone: Patient refused    Gets together: Patient refused    Attends religious service: Patient refused    Active member of club or organization: Patient refused    Attends meetings of clubs or organizations: Patient refused    Relationship status: Patient refused  . Intimate partner violence    Fear of current or ex partner: Not on file    Emotionally abused: Not on file    Physically abused: Not on file    Forced sexual activity: Not on file  Other Topics Concern  . 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 Neck:  Supple; no masses or thyromegaly. Lungs:  Clear throughout to auscultation, normal respiratory effort.    Heart:  +S1, +S2, Regular rate and rhythm, No edema. Abdomen:  Soft, nontender and nondistended. Normal bowel sounds, without guarding, and without rebound.   Neurologic:  Alert and  oriented x4;   grossly normal neurologically.  Impression/Plan: [name] is here for an endoscopy  to be performed for  evaluation of melena    Risks, benefits, limitations, and alternatives regarding endoscopy have been reviewed with the patient.  Questions have been answered.  All parties agreeable.   Miguel Bellows, MD  05/12/2019, 10:50 AM

## 2019-05-12 NOTE — Op Note (Signed)
[facility] Patient Name: [name] Procedure Date: 05/12/2019 11:10 AM MRN: [id number] Account #: [id number] Date of Birth: May 20, 1956 Admit Type: Outpatient Age: 63 Room: [facility] ENDO ROOM 4 Gender: Male Note Status: Finalized Procedure:             Upper GI endoscopy Indications:           Melena, Recent gastrointestinal bleeding Providers:             [name] MD, MD Referring MD:          Ria Bush (Referring MD) Medicines:             Monitored Anesthesia Care Complications:         No immediate complications. Procedure:             Pre-Anesthesia Assessment:                        - Prior to the procedure, a History and Physical was                         performed, and patient medications, allergies and                         sensitivities were reviewed. The patient's tolerance                         of previous anesthesia was reviewed.                        - The risks and benefits of the procedure and the                         sedation options and risks were discussed with the                         patient. All questions were answered and informed                         consent was obtained.                        - ASA Grade Assessment: III - A patient with severe                         systemic disease.                        After obtaining informed consent, the endoscope was                         passed under direct vision. Throughout the procedure,                         the patient's blood pressure, pulse, and oxygen                         saturations were monitored continuously. The Endoscope                         was introduced through the mouth, and  advanced to the                         third part of duodenum. The upper GI endoscopy was                         accomplished with ease. The patient tolerated the                         procedure well. Findings:      The examined duodenum was  normal.      The esophagus was normal.      Moderate portal hypertensive gastropathy was found in the cardia.      The exam was otherwise without abnormality. Impression:            - Normal examined duodenum.                        - Normal esophagus.                        - Portal hypertensive gastropathy.                        - The examination was otherwise normal.                        - No specimens collected. Recommendation:        - Discharge patient to home (with escort).                        - Resume previous diet.                        - Continue present medications.                        - Return to my office as previously scheduled. Procedure Code(s):     --- Professional ---                        [phone], Esophagogastroduodenoscopy, flexible,                         transoral; diagnostic, including collection of                         specimen(s) by brushing or washing, when performed                         (separate procedure) Diagnosis Code(s):     --- Professional ---                        K76.6, Portal hypertension                        K31.89, Other diseases of stomach and duodenum                        K92.1, Melena (includes Hematochezia)  K92.2, Gastrointestinal hemorrhage, unspecified CPT copyright 2019 American Medical Association. All rights reserved. The codes documented in this report are preliminary and upon coder review may  be revised to meet current compliance requirements. [name], MD [name] MD, MD 05/12/2019 11:34:31 AM This report has been signed electronically. Number of Addenda: 0 Note Initiated On: 05/12/2019 11:10 AM Estimated Blood Loss:  Estimated blood loss: none.      [facility]

## 2019-05-13 ENCOUNTER — Encounter: Payer: Self-pay | Admitting: Gastroenterology

## 2019-05-13 NOTE — Anesthesia Postprocedure Evaluation (Signed)
Anesthesia Post Note  Patient: [name]  Procedure(s) Performed: ESOPHAGOGASTRODUODENOSCOPY (EGD) WITH PROPOFOL (N/A )  Patient location during evaluation: PACU Anesthesia Type: General Level of consciousness: awake and alert Pain management: pain level controlled Vital Signs Assessment: post-procedure vital signs reviewed and stable Respiratory status: spontaneous breathing, nonlabored ventilation and respiratory function stable Cardiovascular status: blood pressure returned to baseline and stable Postop Assessment: no apparent nausea or vomiting Anesthetic complications: no     Last Vitals:  Vitals:   05/12/19 1045 05/12/19 1138  BP: 124/80 126/70  Pulse: 65   Resp: 18   Temp: 36.4 C   SpO2: 99%     Last Pain:  Vitals:   05/12/19 1158  TempSrc:   PainSc: 0-No pain                 Durenda Hurt

## 2019-05-14 ENCOUNTER — Ambulatory Visit (INDEPENDENT_AMBULATORY_CARE_PROVIDER_SITE_OTHER): Payer: Medicare PPO | Admitting: Gastroenterology

## 2019-05-14 ENCOUNTER — Telehealth: Payer: Self-pay

## 2019-05-14 DIAGNOSIS — H9190 Unspecified hearing loss, unspecified ear: Secondary | ICD-10-CM | POA: Diagnosis not present

## 2019-05-14 DIAGNOSIS — K7581 Nonalcoholic steatohepatitis (NASH): Secondary | ICD-10-CM | POA: Diagnosis not present

## 2019-05-14 DIAGNOSIS — R069 Unspecified abnormalities of breathing: Secondary | ICD-10-CM | POA: Diagnosis not present

## 2019-05-14 DIAGNOSIS — R296 Repeated falls: Secondary | ICD-10-CM | POA: Diagnosis not present

## 2019-05-14 DIAGNOSIS — I255 Ischemic cardiomyopathy: Secondary | ICD-10-CM | POA: Diagnosis not present

## 2019-05-14 DIAGNOSIS — G600 Hereditary motor and sensory neuropathy: Secondary | ICD-10-CM | POA: Diagnosis not present

## 2019-05-14 DIAGNOSIS — M1611 Unilateral primary osteoarthritis, right hip: Secondary | ICD-10-CM | POA: Diagnosis not present

## 2019-05-14 DIAGNOSIS — R188 Other ascites: Secondary | ICD-10-CM | POA: Diagnosis not present

## 2019-05-14 DIAGNOSIS — K7469 Other cirrhosis of liver: Secondary | ICD-10-CM | POA: Diagnosis not present

## 2019-05-14 DIAGNOSIS — I2510 Atherosclerotic heart disease of native coronary artery without angina pectoris: Secondary | ICD-10-CM | POA: Diagnosis not present

## 2019-05-14 NOTE — Telephone Encounter (Signed)
Called pt to pre-chart for today's e-visit with Dr. Anna  Unable to contact. LVM to return call 

## 2019-05-14 NOTE — Progress Notes (Signed)
Cancelled.  

## 2019-05-15 ENCOUNTER — Ambulatory Visit
Admission: RE | Admit: 2019-05-15 | Discharge: 2019-05-15 | Disposition: A | Discharge status: Home / Self Care | Payer: Medicare PPO | Source: Ambulatory Visit | Attending: Gastroenterology | Admitting: Gastroenterology

## 2019-05-15 ENCOUNTER — Other Ambulatory Visit: Payer: Self-pay

## 2019-05-15 ENCOUNTER — Other Ambulatory Visit: Payer: Self-pay | Admitting: Family Medicine

## 2019-05-15 DIAGNOSIS — R933 Abnormal findings on diagnostic imaging of other parts of digestive tract: Secondary | ICD-10-CM | POA: Diagnosis not present

## 2019-05-15 DIAGNOSIS — K7460 Unspecified cirrhosis of liver: Secondary | ICD-10-CM | POA: Diagnosis not present

## 2019-05-15 DIAGNOSIS — K760 Fatty (change of) liver, not elsewhere classified: Secondary | ICD-10-CM

## 2019-05-15 DIAGNOSIS — K828 Other specified diseases of gallbladder: Secondary | ICD-10-CM | POA: Diagnosis not present

## 2019-05-15 MED ORDER — GADOBUTROL 1 MMOL/ML IV SOLN
10.0000 mL | Freq: Once | INTRAVENOUS | Status: AC | PRN
  Administered 2019-05-15: 10 mL via INTRAVENOUS

## 2019-05-16 NOTE — Progress Notes (Signed)
HPI: FU coronary artery disease, abdominal aortic aneurysm, hypertension, DM, hyperlipidemia, COPD, cirrhosis, tobacco abuse. He has had prior inferior infarct with PCI of his right coronary artery and PDA. His most recent cardiac catheterization was performed on March 30, 2005. The patient had an ejection fraction of 40% with inferior akinesis at that time. However, his stents in the right coronary artery were widely patent. Carotid Dopplers April 2016 showed no significant stenosis.  CTA July 2019 showed prior endovascular repair of suprarenal abdominal aortic aneurysm without complication.  Renal artery stents were widely patent.  There was note of hepatic cirrhosis and portal venous hypertension with varices around the gastroesophageal junction as well as splenomegaly. Echocardiogram May 2020 showed ejection fraction 35 to 40%, moderate left ventricular enlargement, mild left ventricular hypertrophy, mild right ventricular enlargement.  Nuclear study June 2020 showed ejection fraction 41%, inferior infarct but no ischemia.  Abdominal ultrasound August 2020 showed patent endovascular aneurysm repair with no endoleak.  Since last seen patient has occasional chest discomfort.  It starts in the left chest area and radiates to the right.  The last several minutes and resolve spontaneously.  It increases with inspiration and certain movements.  He denies dyspnea.  No pedal edema or syncope.  Current Outpatient Medications  Medication Sig Dispense Refill  . albuterol (PROVENTIL HFA;VENTOLIN HFA) 108 (90 Base) MCG/ACT inhaler Inhale 2 puffs into the lungs every 6 (six) hours as needed for wheezing or shortness of breath. 1 Inhaler 0  . albuterol (PROVENTIL) (2.5 MG/3ML) 0.083% nebulizer solution USE 1 VIAL PER NEBULIZER EVERY 6 HRS AS NEEDED FOR WHEEZING (Patient taking differently: Take 2.5 mg by nebulization every 6 (six) hours as needed for shortness of breath. ) 75 mL 6  . aspirin 81 MG tablet Take  1 tablet (81 mg total) by mouth daily.    . B Complex-C (SUPER B COMPLEX PO) Take 1 tablet by mouth daily.    . Ca Phosphate-Cholecalciferol [phone] MG-UNIT TABS Take 1 tablet by mouth daily.     . cholecalciferol 2000 units TABS Take 2,000 Units by mouth daily.    . Cobalamin Combinations (B-12) 1000-400 MCG SUBL Take 1 tablet by mouth daily.     [name] dicyclomine (BENTYL) 20 MG tablet TAKE 1 TABLET BY MOUTH 3 TIMES DAILY BEFORE MEALS. AS NEEDED FOR ABDOMINAL PAIN (Patient taking differently: Take 20 mg by mouth 4 (four) times daily -  before meals and at bedtime. ) 30 tablet 0  . DULoxetine (CYMBALTA) 60 MG capsule Take 1 capsule (60 mg total) by mouth daily. 90 capsule 3  . fentaNYL (DURAGESIC - DOSED MCG/HR) 50 MCG/HR Place 1 patch (50 mcg total) every other day onto the skin. 15 patch 0  . ferrous sulfate 325 (65 FE) MG tablet Take 1 tablet (325 mg total) by mouth every other day.    . fluticasone (FLONASE) 50 MCG/ACT nasal spray Place 2 sprays into both nostrils daily as needed for allergies.     . folic acid (FOLVITE) 1 MG tablet Take 1 tablet (1 mg total) by mouth daily. 90 tablet 3  . furosemide (LASIX) 40 MG tablet Take 1 tablet (40 mg total) by mouth daily. 30 tablet 1  . Lactulose 20 GM/30ML SOLN Take 30 mLs (20 g total) by mouth 3 (three) times daily. 2700 mL 5  . methocarbamol (ROBAXIN) 500 MG tablet TAKE 1 TABLET (500 MG TOTAL) BY MOUTH AT BEDTIME. 30 tablet 0  . metoprolol succinate (TOPROL-XL) 25  MG 24 hr tablet Take 0.5 tablets (12.5 mg total) by mouth daily. 30 tablet 1  . Misc Natural Products (TART CHERRY ADVANCED) CAPS Take 1 capsule by mouth 2 (two) times daily.    . nitroGLYCERIN (NITROSTAT) 0.4 MG SL tablet Place 1 tablet (0.4 mg total) under the tongue every 5 (five) minutes as needed for chest pain. 25 tablet 12  . nystatin cream (MYCOSTATIN) Apply 1 application topically 2 (two) times daily. 80 g 0  . omeprazole (PRILOSEC) 40 MG capsule TAKE 1 CAPSULE BY MOUTH EVERY DAY  (Patient taking differently: Take 40 mg by mouth daily. ) 30 capsule 11  . ondansetron (ZOFRAN-ODT) 4 MG disintegrating tablet Take 1 tablet (4 mg total) by mouth every 8 (eight) hours as needed for nausea or vomiting. Day supply per insurance. 20 tablet 3  . oxyCODONE (ROXICODONE) 15 MG immediate release tablet Take 1 tablet (15 mg total) by mouth every 4 (four) hours as needed. (Patient taking differently: Take 90 mg by mouth daily. Take 6 tablets by mouth daily per patient) 90 tablet 0  . Red Yeast Rice 600 MG CAPS Take 2 capsules by mouth daily.    [name] spironolactone (ALDACTONE) 100 MG tablet Take 1 tablet (100 mg total) by mouth daily. 30 tablet 1  . vitamin B-12 (CYANOCOBALAMIN) 500 MCG tablet Take 1 tablet (500 mcg total) by mouth every Monday, Wednesday, and Friday.     No current facility-administered medications for this visit.     Past Medical History:  Diagnosis Date  . AAA (abdominal aortic aneurysm) ([place]) 09/2012--  monitored by dr Trula Slade   stable 5.6cm CTA abdomen 2016  . Abnormal drug screen 07/09/2016   1/2/018 - positive oxycodone, fentanyl, inapprop positive MJ - mod risk  . Allergic rhinitis   . Ascites 03/2019  . B12 deficiency   . CAD (coronary artery disease) cardiologist-  dr Stanford Breed   x3 with stents last 2005, EF 40%, predominantly RCA by CT 2016  . Cataracts, bilateral   . Cervical spondylosis 05/2010   s/p surgery  . Charcot Marie Tooth muscular atrophy dx  [phone]   neurologist--  dr love--  type 2 per pt  . Chronic pain syndrome    established with [facility] ([place] --> disagreement and transfered care to Dr Sanjuan Dame at [facility], requests PCP write Rx but f/u with pain clinic Q6-12 months  . COPD (chronic obstructive pulmonary disease) ([place]) 10/2011   minimal by PFTs  . DDD (degenerative disc disease)   . Disturbances of sensation of smell and taste    improving  . Dyspnea on exertion   . GERD (gastroesophageal reflux disease)    . Gout   . Headache   . Hepatitis    hepatitis B  . Hidradenitis    right groin  . Hidradenitis suppurativa dx 2011   goin and leg crease   followd by Lyndle Herrlich - daily bactrim, s/p intralesional steroid injection 10/2010  . Hip osteoarthritis    s/p intraarticular steroid shot (12/2012) ([place])  . History of hepatitis B 1983  . History of MI (myocardial infarction)    2000  &  2005  . History of pneumonia   . History of viral meningitis 2000  . HLD (hyperlipidemia)   . HTN (hypertension)   . Ischemic cardiomyopathy    s/p inferior MI  --  current ef per myoview 39%  . Liver cirrhosis secondary to NASH ([place]) 01/2014   by CT scan,  rec virtual colonoscopy by Dr Collene Mares 06/2014  . Lumbar herniated disc   . Myocardial infarction ([place])    x2  . Nocturia more than twice per night   . Obesity   . Spinal stenosis    released from [place].  established with preferred pain (07/2013)  . T2DM (type 2 diabetes mellitus) ([place])    ABIs WNL 2016  . Vitamin D deficiency     Past Surgical History:  Procedure Laterality Date  . ABDOMINAL AORTIC ENDOVASCULAR FENESTRATED STENT GRAFT N/A 11/30/2015   Procedure: ABDOMINAL AORTIC ENDOVASCULAR FENESTRATED STENT GRAFT;  Surgeon: Serafina Mitchell, MD;  Location: [place];  Service: Vascular;  Laterality: N/A;  . ANTERIOR CERVICAL DECOMP/DISCECTOMY FUSION  01-07-2010    C4 -- C7  . CARDIAC CATHETERIZATION  03-30-2005  dr Albertine Patricia   ef 40% w/ inferior akinesis/  LM and CFX angiographically normal/  pLAD 30%/   Widely patent stents in RCA and PDA widely patent  . CARDIOVASCULAR STRESS TEST  10-23-2012  dr Stanford Breed   No ischemia/  Moderate scar in the inferior wall, otherwise normal perfusion/  LV ef 39%,  LV wall motion: inferior/ inferolateral hypokinesis  . COLONOSCOPY  05/06/2007   normal, small int hemorrhoids rpt 5 yrs due to fmhx - rec against rpt colonoscopy by Dr Collene Mares  . CORONARY ANGIOPLASTY  2000  dr Stanford Breed   PCI to RCA and PDA  . CORONARY  ANGIOPLASTY WITH STENT PLACEMENT  03-19-2005  dr Gwyndolyn Saxon downey   inferior STEMI--- DES x4 to RCA w/ balloon angioplasty and balloon angioplasty to jailed PDA ostium/  severe hypokinesis of midinferor wall, ef 50%/  dLM 20%,  mLAD 20%,  dCFX 60%  . ESOPHAGOGASTRODUODENOSCOPY  01/2017   dilated benign esophageal stenosis, portal hypertensive gastropathy Henrene Pastor)  . ESOPHAGOGASTRODUODENOSCOPY (EGD) WITH PROPOFOL N/A 02/20/2018   benign biopsy [name], MD)  . ESOPHAGOGASTRODUODENOSCOPY (EGD) WITH PROPOFOL N/A 05/12/2019   Procedure: ESOPHAGOGASTRODUODENOSCOPY (EGD) WITH PROPOFOL;  Surgeon: [name], MD;  Location: [facility] ENDOSCOPY;  Service: Gastroenterology;  Laterality: N/A;  . HYDRADENITIS EXCISION Right 12/31/2014   Procedure: WIDE EXCISION HIDRADENITIS GROIN; Coralie Keens, MD  . IR PARACENTESIS  03/25/2019  . LUMBAR DISC SURGERY     L5-S1  . LUMBAR LAMINECTOMY  05-18-2010   L2--5   laminectomy/foraminotomy for stenosis Joya Salm)  . MYELOGRAM     L5-S1 and L1-2 spondylosis  . SACROILIAC JOINT INJECTION Bilateral 10/2013   [place]  . TONSILLECTOMY AND ADENOIDECTOMY  1972    Social History   Socioeconomic History  . Marital status: Married    Spouse name: [name]  . Number of children: 0  . Years of education: College  . Highest education level: Not on file  Occupational History  . Occupation: Neurosurgeon implants, crown and bridge-now disability 2006    Employer: UNEMPLOYED  Tobacco Use  . Smoking status: Current Some Day Smoker    Packs/day: 1.00    Years: 57.00    Pack years: 57.00    Types: Cigarettes, E-cigarettes    Start date: 06/06/1967  . Smokeless tobacco: Never Used  . Tobacco comment: stopped smoking a pipe in 2015  DOES SMOKE E CIG  Substance and Sexual Activity  . Alcohol use: No    Alcohol/week: 0.0 standard drinks  . Drug use: Yes    Types: Fentanyl, Hydrocodone  . Sexual activity: Not on file  Other Topics Concern  . Not on file  Social History  Narrative   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  x 5 years   caffeine: 2 cups coffee, 2 cups soda   Occupation: Neurosurgeon implants, crowne and bridge, now disability 2006   Lives with wife, 1 dog, no children   Social Determinants of Health   Financial Resource Strain:   . Difficulty of Paying Living Expenses: Not on file  Food Insecurity:   . Worried About Charity fundraiser in the Last Year: Not on file  . Ran Out of Food in the Last Year: Not on file  Transportation Needs:   . Lack of Transportation (Medical): Not on file  . Lack of Transportation (Non-Medical): Not on file  Physical Activity:   . Days of Exercise per Week: Not on file  . Minutes of Exercise per Session: Not on file  Stress:   . Feeling of Stress : Not on file  Social Connections:   . Frequency of Communication with Friends and Family: Not on file  . Frequency of Social Gatherings with Friends and Family: Not on file  . Attends Religious Services: Not on file  . Active Member of Clubs or Organizations: Not on file  . Attends Archivist Meetings: Not on file  . Marital Status: Not on file  Intimate Partner Violence:   . Fear of Current or Ex-Partner: Not on file  . Emotionally Abused: Not on file  . Physically Abused: Not on file  . Sexually Abused: Not on file    Family History  Problem Relation Age of Onset  . Cancer Mother        colon  . Diabetes Mother   . Kidney disease Mother   . Aneurysm Mother        AAA  . Rheum arthritis Mother   . Charcot-Marie-Tooth disease Mother   . Heart disease Mother        before age 34  . Cancer Father        skin  . Heart attack Father   . Heart disease Father        before age 23  . Cancer Brother        skin  . Coronary artery disease Brother   . Cancer Brother        small cell lung cancer  . Aneurysm Brother        AAA  . Rheum arthritis Sister   . Rheum arthritis Brother   . Prostate cancer Neg Hx   . Bladder Cancer Neg Hx    . Kidney cancer Neg Hx     ROS: Patient has hip arthralgias but no fevers or chills, productive cough, hemoptysis, dysphasia, odynophagia, melena, hematochezia, dysuria, hematuria, rash, seizure activity, orthopnea, PND, pedal edema, claudication. Remaining systems are negative.  Physical Exam: Well-developed well-nourished in no acute distress.  Skin is warm and dry.  HEENT is normal.  Neck is supple.  Chest is clear to auscultation with normal expansion.  Cardiovascular exam is regular rate and rhythm.  Abdominal exam nontender or distended. No masses palpated. Extremities show no edema. neuro grossly intact  ECG-sinus rhythm with occasional PVC, nonspecific ST changes.  Personally reviewed  A/P  1 coronary artery disease- Continue aspirin.  Intolerant to statins.  Occasional atypical chest pain.  Will arrange stress nuclear study for risk stratification.  2 ischemic cardiomyopathy-continue beta-blocker.  Add Avapro 75 mg daily.  Check potassium and renal function in 1 week.  3 hypertension-blood pressure controlled.  Continue present medications.  4 hyperlipidemia-intolerant to statins.  Check lipids.  If  elevated will consider Repatha or Zetia.  5 history of abdominal aortic aneurysm repair-stable on most recent ultrasound.  Followed by vascular surgery.  6 tobacco abuse-patient counseled on discontinuing.  7 history of cirrhosis-followed by gastroenterology.  Kirk Ruths, MD

## 2019-05-19 DIAGNOSIS — I110 Hypertensive heart disease with heart failure: Secondary | ICD-10-CM

## 2019-05-19 DIAGNOSIS — R188 Other ascites: Secondary | ICD-10-CM | POA: Diagnosis not present

## 2019-05-19 DIAGNOSIS — G600 Hereditary motor and sensory neuropathy: Secondary | ICD-10-CM | POA: Diagnosis not present

## 2019-05-19 DIAGNOSIS — K219 Gastro-esophageal reflux disease without esophagitis: Secondary | ICD-10-CM

## 2019-05-19 DIAGNOSIS — J449 Chronic obstructive pulmonary disease, unspecified: Secondary | ICD-10-CM

## 2019-05-19 DIAGNOSIS — H9190 Unspecified hearing loss, unspecified ear: Secondary | ICD-10-CM | POA: Diagnosis not present

## 2019-05-19 DIAGNOSIS — D696 Thrombocytopenia, unspecified: Secondary | ICD-10-CM

## 2019-05-19 DIAGNOSIS — M1611 Unilateral primary osteoarthritis, right hip: Secondary | ICD-10-CM | POA: Diagnosis not present

## 2019-05-19 DIAGNOSIS — R296 Repeated falls: Secondary | ICD-10-CM | POA: Diagnosis not present

## 2019-05-19 DIAGNOSIS — G894 Chronic pain syndrome: Secondary | ICD-10-CM

## 2019-05-19 DIAGNOSIS — Z6838 Body mass index (BMI) 38.0-38.9, adult: Secondary | ICD-10-CM

## 2019-05-19 DIAGNOSIS — I255 Ischemic cardiomyopathy: Secondary | ICD-10-CM | POA: Diagnosis not present

## 2019-05-19 DIAGNOSIS — M47892 Other spondylosis, cervical region: Secondary | ICD-10-CM

## 2019-05-19 DIAGNOSIS — E559 Vitamin D deficiency, unspecified: Secondary | ICD-10-CM

## 2019-05-19 DIAGNOSIS — I714 Abdominal aortic aneurysm, without rupture: Secondary | ICD-10-CM

## 2019-05-19 DIAGNOSIS — I5030 Unspecified diastolic (congestive) heart failure: Secondary | ICD-10-CM

## 2019-05-19 DIAGNOSIS — F17210 Nicotine dependence, cigarettes, uncomplicated: Secondary | ICD-10-CM

## 2019-05-19 DIAGNOSIS — K7469 Other cirrhosis of liver: Secondary | ICD-10-CM | POA: Diagnosis not present

## 2019-05-19 DIAGNOSIS — I252 Old myocardial infarction: Secondary | ICD-10-CM

## 2019-05-19 DIAGNOSIS — I2510 Atherosclerotic heart disease of native coronary artery without angina pectoris: Secondary | ICD-10-CM | POA: Diagnosis not present

## 2019-05-19 DIAGNOSIS — K7581 Nonalcoholic steatohepatitis (NASH): Secondary | ICD-10-CM | POA: Diagnosis not present

## 2019-05-19 DIAGNOSIS — D539 Nutritional anemia, unspecified: Secondary | ICD-10-CM

## 2019-05-19 DIAGNOSIS — E1136 Type 2 diabetes mellitus with diabetic cataract: Secondary | ICD-10-CM

## 2019-05-19 DIAGNOSIS — M109 Gout, unspecified: Secondary | ICD-10-CM

## 2019-05-20 ENCOUNTER — Ambulatory Visit (INDEPENDENT_AMBULATORY_CARE_PROVIDER_SITE_OTHER): Payer: Medicare PPO | Admitting: Gastroenterology

## 2019-05-20 DIAGNOSIS — G600 Hereditary motor and sensory neuropathy: Secondary | ICD-10-CM | POA: Diagnosis not present

## 2019-05-20 DIAGNOSIS — R188 Other ascites: Secondary | ICD-10-CM | POA: Diagnosis not present

## 2019-05-20 DIAGNOSIS — K7581 Nonalcoholic steatohepatitis (NASH): Secondary | ICD-10-CM | POA: Diagnosis not present

## 2019-05-20 DIAGNOSIS — K7460 Unspecified cirrhosis of liver: Secondary | ICD-10-CM | POA: Diagnosis not present

## 2019-05-20 DIAGNOSIS — M1611 Unilateral primary osteoarthritis, right hip: Secondary | ICD-10-CM | POA: Diagnosis not present

## 2019-05-20 DIAGNOSIS — H9190 Unspecified hearing loss, unspecified ear: Secondary | ICD-10-CM | POA: Diagnosis not present

## 2019-05-20 DIAGNOSIS — I255 Ischemic cardiomyopathy: Secondary | ICD-10-CM | POA: Diagnosis not present

## 2019-05-20 DIAGNOSIS — I2510 Atherosclerotic heart disease of native coronary artery without angina pectoris: Secondary | ICD-10-CM | POA: Diagnosis not present

## 2019-05-20 DIAGNOSIS — R296 Repeated falls: Secondary | ICD-10-CM | POA: Diagnosis not present

## 2019-05-20 DIAGNOSIS — K7469 Other cirrhosis of liver: Secondary | ICD-10-CM | POA: Diagnosis not present

## 2019-05-20 NOTE — Progress Notes (Signed)
[name] , MD [address]  [place]  [place], [place] [address]  Main: [phone]  Fax: [phone]   Primary Care Physician: Ria Bush, MD  Virtual Visit via Telephone Note  I connected with patient on 05/20/19 at 11:30 AM EST by telephone and verified that I am speaking with the correct person using two identifiers.   I discussed the limitations, risks, security and privacy concerns of performing an evaluation and management service by telephone and the availability of in person appointments. I also discussed with the patient that there may be a patient responsible charge related to this service. The patient expressed understanding and agreed to proceed.  Location of Patient: Home Location of Provider: Home Persons involved: Patient and provider only   History of Present Illness: F/u liver cirrhosis.   HPI: [name] is a 63 y.o. male    Summary of history :  He was initially referred and seen on 02/13/18 for cirrhosis of the liver . Not seen by me since 04/2018 - didn't follow up.He has been seen by [facility] in the past.. His Cirrhosis had been attriuted to NASH. [name] No tatoos, no alcohol , no military service, no blood transfusions. Used cocaine in his 20's, none recently. Weighed 370 lbs at his heaviest. Was always " big", he is not diabetic.   EGD 01/2017 -PHG, mild stricture was dilated.  RUQ USG 11/2017 : Small ascites, cirrhosis, Chronic dilation of CBD, mild dilation of PD, splenomegaly   CT angio of the abdomen 12/2017:Post endovascular repair of suprarenal abdominal aortic aneurysm without evidence of complication.Suspected hemodynamically significant stenosis involving the origin of the common hepatic artery, similar to the 12/2015 examination.Potential hemodynamically significant narrowing involving theproximal aspect of the right SFA.Findings of hepatic cirrhosis and portal venous hypertension with several varices about the  gastroesophageal junction, splenomegaly and trace amount of intra-abdominal ascites. No discrete hyperenhancing hepatic lesions  02/20/18 ; EGD -normal -no eosinophils in esophagus.  02/13/18 : Cr 1.03,Hep A ab positive , albumin 3.0 ,alk phos 149 , Hbsab/ag,HCV ab , ANA,negative .Ceruloplasmin ,alpha 1 AT,Iron studies - normal   Smooth muscle antibody positive , elevated immunoglobulins G, AMA,LKM ab negative 03/19/18 : liver biopsy : steatosis , focal plasma cells, mild chronic cholestasis. Unmable to get a diagnosis due to burned out liver disease. 04/25/2019 : INR : 1.3 , MELD 10,Cr 0.94  Interval history11/24/2020-05/20/2019  05/15/2019: Cirrhotic liver, dilated CBD, no obstruction . Splenomegaly and trace ascites 05/12/2019: EGD: Normal  Recent hospital admission in 03/26/2019 for ascites, underwent paracentesis.   No gain of weight . Low salt diet . Taking Aldactone 100 mg and Lasix 40 mg/day. Has 1 bowel movement a day .   No NSAID's  Current Outpatient Medications  Medication Sig Dispense Refill  . albuterol (PROVENTIL HFA;VENTOLIN HFA) 108 (90 Base) MCG/ACT inhaler Inhale 2 puffs into the lungs every 6 (six) hours as needed for wheezing or shortness of breath. 1 Inhaler 0  . albuterol (PROVENTIL) (2.5 MG/3ML) 0.083% nebulizer solution USE 1 VIAL PER NEBULIZER EVERY 6 HRS AS NEEDED FOR WHEEZING (Patient taking differently: Take 2.5 mg by nebulization every 6 (six) hours as needed for shortness of breath. ) 75 mL 6  . aspirin 81 MG tablet Take 1 tablet (81 mg total) by mouth daily.    . B Complex-C (SUPER B COMPLEX PO) Take 1 tablet by mouth daily.    . Ca Phosphate-Cholecalciferol [phone] MG-UNIT TABS Take 1 tablet by mouth daily.     [name]  cholecalciferol 2000 units TABS Take 2,000 Units by mouth daily.    . Cobalamin Combinations (B-12) 1000-400 MCG SUBL Take 1 tablet by mouth daily.     [name] dicyclomine (BENTYL) 20 MG tablet TAKE 1 TABLET BY MOUTH 3 TIMES DAILY BEFORE  MEALS. AS NEEDED FOR ABDOMINAL PAIN (Patient taking differently: Take 20 mg by mouth 4 (four) times daily -  before meals and at bedtime. ) 30 tablet 0  . DULoxetine (CYMBALTA) 60 MG capsule Take 1 capsule (60 mg total) by mouth daily. 90 capsule 3  . fentaNYL (DURAGESIC - DOSED MCG/HR) 50 MCG/HR Place 1 patch (50 mcg total) every other day onto the skin. 15 patch 0  . ferrous sulfate 325 (65 FE) MG tablet Take 1 tablet (325 mg total) by mouth every other day.    . fluticasone (FLONASE) 50 MCG/ACT nasal spray Place 2 sprays into both nostrils daily as needed for allergies.     . folic acid (FOLVITE) 1 MG tablet Take 1 tablet (1 mg total) by mouth daily. 90 tablet 3  . furosemide (LASIX) 40 MG tablet Take 1 tablet (40 mg total) by mouth daily. 30 tablet 1  . lactulose (CEPHULAC) 20 g packet Take 1 packet (20 g total) by mouth 3 (three) times daily. 90 packet 5  . methocarbamol (ROBAXIN) 500 MG tablet TAKE 1 TABLET (500 MG TOTAL) BY MOUTH AT BEDTIME. 30 tablet 0  . metoprolol succinate (TOPROL-XL) 25 MG 24 hr tablet Take 0.5 tablets (12.5 mg total) by mouth daily. 30 tablet 1  . Misc Natural Products (TART CHERRY ADVANCED) CAPS Take 1 capsule by mouth 2 (two) times daily.    . nitroGLYCERIN (NITROSTAT) 0.4 MG SL tablet Place 1 tablet (0.4 mg total) under the tongue every 5 (five) minutes as needed for chest pain. 25 tablet 12  . nystatin cream (MYCOSTATIN) Apply 1 application topically 2 (two) times daily. 80 g 0  . omeprazole (PRILOSEC) 40 MG capsule TAKE 1 CAPSULE BY MOUTH EVERY DAY (Patient taking differently: Take 40 mg by mouth daily. ) 30 capsule 11  . ondansetron (ZOFRAN-ODT) 4 MG disintegrating tablet Take 1 tablet (4 mg total) by mouth every 8 (eight) hours as needed for nausea or vomiting. Day supply per insurance. 20 tablet 3  . oxyCODONE (ROXICODONE) 15 MG immediate release tablet Take 1 tablet (15 mg total) by mouth every 4 (four) hours as needed. (Patient taking differently: Take 90 mg by  mouth daily. Take 6 tablets by mouth daily per patient) 90 tablet 0  . Red Yeast Rice 600 MG CAPS Take 2 capsules by mouth daily.    [name] spironolactone (ALDACTONE) 100 MG tablet Take 1 tablet (100 mg total) by mouth daily. 30 tablet 1  . vitamin B-12 (CYANOCOBALAMIN) 500 MCG tablet Take 1 tablet (500 mcg total) by mouth every Monday, Wednesday, and Friday.     No current facility-administered medications for this visit.    Allergies as of 05/20/2019 - Review Complete 05/14/2019  Allergen Reaction Noted  . Statins Shortness Of Breath 01/22/2012  . Losartan Other (See Comments) 01/30/2013  . Allopurinol Nausea Only 12/18/2012  . Baclofen Nausea And Vomiting 06/23/2015  . Gabapentin Nausea And Vomiting 04/21/2015  . Penicillins Nausea And Vomiting 02/20/2018  . Tramadol Nausea Only 03/13/2017    Review of Systems:    All systems reviewed and negative except where noted in HPI.   Observations/Objective:  Labs: CMP     Component Value Date/Time   NA 136 04/25/2019  1250   NA 137 12/10/2018 0000   K 4.4 04/25/2019 1250   CL 99 04/25/2019 1250   CO2 32 04/25/2019 1250   GLUCOSE 118 (H) 04/25/2019 1250   BUN 17 04/25/2019 1250   BUN 16 02/13/2018 1114   CREATININE 0.94 04/25/2019 1250   CREATININE 0.80 04/09/2015 1530   CALCIUM 8.7 04/25/2019 1250   PROT 8.0 04/25/2019 1250   PROT 7.1 02/13/2018 1113   ALBUMIN 3.3 (L) 04/25/2019 1250   ALBUMIN 3.0 (L) 02/13/2018 1113   AST 18 04/25/2019 1250   ALT 9 04/25/2019 1250   ALKPHOS 167 (H) 04/25/2019 1250   BILITOT 1.1 04/25/2019 1250   BILITOT 0.9 02/13/2018 1113   GFRNONAA >60 03/26/2019 0526   GFRNONAA >89 01/05/2014 1159   GFRAA >60 03/26/2019 0526   GFRAA >89 01/05/2014 1159   Lab Results  Component Value Date   WBC 6.1 04/29/2019   HGB 12.3 (L) 04/29/2019   HCT 36.3 (L) 04/29/2019   MCV 96 04/29/2019   PLT 61 (LL) 04/29/2019    Imaging Studies: MR 3D Recon At Scanner  Result Date: 05/15/2019 CLINICAL DATA:  Fatty  liver, cirrhosis, ascites, screening for HCC, evaluate dilated common bile duct and pancreatic duct EXAM: MRI ABDOMEN WITHOUT AND WITH CONTRAST (INCLUDING MRCP) TECHNIQUE: Multiplanar multisequence MR imaging of the abdomen was performed both before and after the administration of intravenous contrast. Heavily T2-weighted images of the biliary and pancreatic ducts were obtained, and three-dimensional MRCP images were rendered by post processing. CONTRAST:  51m GADAVIST GADOBUTROL 1 MMOL/ML IV SOLN COMPARISON:  Abdominal ultrasound, 03/19/2018, CT angiogram abdomen and pelvis, 12/28/2017 FINDINGS: Examination is significantly limited by the presence of aortic stent endograft and associated susceptibility artifact, which renders assessment of the pancreas, central portions of the liver, retroperitoneum, and central portal, mesenteric, and splenic vessels nondiagnostic. Lower chest: No acute findings. Hepatobiliary: Cirrhotic morphology of the liver with heterogeneous, nodular texture and enhancement pattern. There are no suspicious focally enhancing lesions; as noted, central portions of the liver, particularly the caudate lobe are inadequately assessed due to adjacent susceptibility artifact. The gallbladder is distended without filling defect appreciated. There is dilatation of the common bile duct, which tapers smoothly to the ampulla without obstructing lesion identified distally (series 14, image 11, series 12, image 37). Pancreas: Assessment of the pancreas is very limited and generally nondiagnostic due to susceptibility artifact. Included portions of the pancreatic duct are nondilated. Spleen:  Splenomegaly, maximum span 16.2 cm. Adrenals/Urinary Tract: Limited assessment of the kidneys. No hydronephrosis. Nonenhancing simple cyst of the left kidney. Stomach/Bowel: Visualized portions within the abdomen are unremarkable. Vascular/Lymphatic: Aortic stent endograft. The abdominal lymph node stations are  obscured by susceptibility artifact. Other:  Trace perihepatic ascites. Musculoskeletal: No suspicious bone lesions identified. IMPRESSION: 1. Markedly limited study due to the presence of aortic stent endograft and associated susceptibility artifact, which renders assessment of the pancreas, central portions of the liver, retroperitoneum, and central portal, mesenteric, and splenic vessels nondiagnostic. 2. Cirrhotic morphology of the liver. No suspicious focally enhancing lesions identified. 3. There is dilatation of the common bile duct, which tapers smoothly to the ampulla without obstructing lesion identified distally (series 14, image 11, series 12, image 37). 4. Distended gallbladder without gallstones or other filling defect. 5. Splenomegaly. 6. Trace perihepatic ascites. Electronically Signed   By: AEddie CandleM.D.   On: 05/15/2019 14:00   MR ABDOMEN MRCP W WO CONTAST  Result Date: 05/15/2019 CLINICAL DATA:  Fatty liver, cirrhosis, ascites,  screening for [place], evaluate dilated common bile duct and pancreatic duct EXAM: MRI ABDOMEN WITHOUT AND WITH CONTRAST (INCLUDING MRCP) TECHNIQUE: Multiplanar multisequence MR imaging of the abdomen was performed both before and after the administration of intravenous contrast. Heavily T2-weighted images of the biliary and pancreatic ducts were obtained, and three-dimensional MRCP images were rendered by post processing. CONTRAST:  7m GADAVIST GADOBUTROL 1 MMOL/ML IV SOLN COMPARISON:  Abdominal ultrasound, 03/19/2018, CT angiogram abdomen and pelvis, 12/28/2017 FINDINGS: Examination is significantly limited by the presence of aortic stent endograft and associated susceptibility artifact, which renders assessment of the pancreas, central portions of the liver, retroperitoneum, and central portal, mesenteric, and splenic vessels nondiagnostic. Lower chest: No acute findings. Hepatobiliary: Cirrhotic morphology of the liver with heterogeneous, nodular texture and  enhancement pattern. There are no suspicious focally enhancing lesions; as noted, central portions of the liver, particularly the caudate lobe are inadequately assessed due to adjacent susceptibility artifact. The gallbladder is distended without filling defect appreciated. There is dilatation of the common bile duct, which tapers smoothly to the ampulla without obstructing lesion identified distally (series 14, image 11, series 12, image 37). Pancreas: Assessment of the pancreas is very limited and generally nondiagnostic due to susceptibility artifact. Included portions of the pancreatic duct are nondilated. Spleen:  Splenomegaly, maximum span 16.2 cm. Adrenals/Urinary Tract: Limited assessment of the kidneys. No hydronephrosis. Nonenhancing simple cyst of the left kidney. Stomach/Bowel: Visualized portions within the abdomen are unremarkable. Vascular/Lymphatic: Aortic stent endograft. The abdominal lymph node stations are obscured by susceptibility artifact. Other:  Trace perihepatic ascites. Musculoskeletal: No suspicious bone lesions identified. IMPRESSION: 1. Markedly limited study due to the presence of aortic stent endograft and associated susceptibility artifact, which renders assessment of the pancreas, central portions of the liver, retroperitoneum, and central portal, mesenteric, and splenic vessels nondiagnostic. 2. Cirrhotic morphology of the liver. No suspicious focally enhancing lesions identified. 3. There is dilatation of the common bile duct, which tapers smoothly to the ampulla without obstructing lesion identified distally (series 14, image 11, series 12, image 37). 4. Distended gallbladder without gallstones or other filling defect. 5. Splenomegaly. 6. Trace perihepatic ascites. Electronically Signed   By: AEddie CandleM.D.   On: 05/15/2019 14:00    Assessment and Plan:   Miguel MOUSELis a 63y.o. y/o malehere to follow upfor liver cirrhosis. In the past been felt by his last GI  in 2018 was probably NASH related, autoimmune work upshows a positive smooth muscle ab and elevated immunoglobulins.MELD 10 Liver biopsy is inconclusive due to burnt out liver. Some hepatic encephalopathy likely secondary to constipation.  Ascites seems to be stable with Aldactone and Lasix.   Plan  1.  Continue Aldactone and Lasix at 100 mg and 40 mg expectedly.. 2. Low salt diet, low fat diet. 3. Advised to maintain chart for daily weights and I will speak to him in 10 days time. No encephalopathy presentlty, has had issues getting lactulose refilled.  We will contact his insurance to find out what the issue is.  If encephalopathy worsens at his next visit then we may need to add  Xifaxan. 4. EGDto screen for varices in 2022 December , Check BMP,INR 5. StoppedSmoking/vaping-congratulated him  6. Latulose daily titrated to 1-2 soft bowel movements a day provide prescription.  F/u in 6 weeks in office    I discussed the assessment and treatment plan with the patient. The patient was provided an opportunity to ask questions and all were answered. The patient agreed  with the plan and demonstrated an understanding of the instructions.   The patient was advised to call back or seek an in-person evaluation if the symptoms worsen or if the condition fails to improve as anticipated.  I provided 12 minutes of non-face-to-face time during this encounter.  Dr Miguel Bellows MD,MRCP [facility]) Gastroenterology/Hepatology Pager: [phone]   Speech recognition software was used to dictate this note.

## 2019-05-21 ENCOUNTER — Other Ambulatory Visit: Payer: Self-pay

## 2019-05-21 DIAGNOSIS — K7460 Unspecified cirrhosis of liver: Secondary | ICD-10-CM

## 2019-05-21 DIAGNOSIS — R188 Other ascites: Secondary | ICD-10-CM

## 2019-05-21 MED ORDER — LACTULOSE 20 GM/30ML PO SOLN: 20.0000 g | Freq: Three times a day (TID) | ORAL | 5 refills | Status: DC

## 2019-05-22 ENCOUNTER — Encounter: Payer: Self-pay | Admitting: Cardiology

## 2019-05-22 ENCOUNTER — Other Ambulatory Visit: Payer: Self-pay

## 2019-05-22 ENCOUNTER — Ambulatory Visit (INDEPENDENT_AMBULATORY_CARE_PROVIDER_SITE_OTHER): Payer: Medicare PPO | Admitting: Cardiology

## 2019-05-22 VITALS — BP 122/70 | HR 73 | Ht 69.0 in | Wt 255.0 lb

## 2019-05-22 DIAGNOSIS — I10 Essential (primary) hypertension: Secondary | ICD-10-CM

## 2019-05-22 DIAGNOSIS — R072 Precordial pain: Secondary | ICD-10-CM

## 2019-05-22 DIAGNOSIS — E7800 Pure hypercholesterolemia, unspecified: Secondary | ICD-10-CM

## 2019-05-22 DIAGNOSIS — I2510 Atherosclerotic heart disease of native coronary artery without angina pectoris: Secondary | ICD-10-CM | POA: Diagnosis not present

## 2019-05-22 MED ORDER — IRBESARTAN 75 MG PO TABS: 75.0000 mg | ORAL_TABLET | Freq: Every day | ORAL | 3 refills | Status: DC

## 2019-05-22 NOTE — Patient Instructions (Addendum)
Medication Instructions:  START AVAPRO 75MG, TAKE 1 TABLET ONCE DAILY *If you need a refill on your cardiac medications before your next appointment, please call your pharmacy*  Lab Work: Your physician recommends that you return for lab work in: [place]  If you have labs (blood work) drawn today and your tests are completely normal, you will receive your results only by: [name] MyChart Message (if you have MyChart) OR . A paper copy in the mail If you have any lab test that is abnormal or we need to change your treatment, we will call you to review the results.  Testing/Procedures: Your physician has requested that you have a lexiscan myoview. For further information please visit [email or web address]. Please follow instruction sheet, as given. [name]    Follow-Up: At [facility], you and your health needs are our priority.  As part of our continuing mission to provide you with exceptional heart care, we have created designated Provider Care Teams.  These Care Teams include your primary Cardiologist (physician) and Advanced Practice Providers (APPs -  Physician Assistants and Nurse Practitioners) who all work together to provide you with the care you need, when you need it.  Your next appointment:   6 MONTHS  The format for your next appointment:   EITHER VIRTUAL OR IN PERSON  Provider:   Kirk Ruths MD

## 2019-05-23 ENCOUNTER — Telehealth (HOSPITAL_COMMUNITY): Payer: Self-pay

## 2019-05-23 NOTE — Telephone Encounter (Signed)
Encounter complete. 

## 2019-05-27 ENCOUNTER — Ambulatory Visit (HOSPITAL_COMMUNITY)
Admission: RE | Admit: 2019-05-27 | Payer: Medicare PPO | Source: Ambulatory Visit | Attending: Cardiology | Admitting: Cardiology

## 2019-05-27 DIAGNOSIS — K7581 Nonalcoholic steatohepatitis (NASH): Secondary | ICD-10-CM | POA: Diagnosis not present

## 2019-05-27 DIAGNOSIS — I2510 Atherosclerotic heart disease of native coronary artery without angina pectoris: Secondary | ICD-10-CM | POA: Diagnosis not present

## 2019-05-27 DIAGNOSIS — R296 Repeated falls: Secondary | ICD-10-CM | POA: Diagnosis not present

## 2019-05-27 DIAGNOSIS — I255 Ischemic cardiomyopathy: Secondary | ICD-10-CM | POA: Diagnosis not present

## 2019-05-27 DIAGNOSIS — R188 Other ascites: Secondary | ICD-10-CM | POA: Diagnosis not present

## 2019-05-27 DIAGNOSIS — H9190 Unspecified hearing loss, unspecified ear: Secondary | ICD-10-CM | POA: Diagnosis not present

## 2019-05-27 DIAGNOSIS — M1611 Unilateral primary osteoarthritis, right hip: Secondary | ICD-10-CM | POA: Diagnosis not present

## 2019-05-27 DIAGNOSIS — G600 Hereditary motor and sensory neuropathy: Secondary | ICD-10-CM | POA: Diagnosis not present

## 2019-05-27 DIAGNOSIS — K7469 Other cirrhosis of liver: Secondary | ICD-10-CM | POA: Diagnosis not present

## 2019-06-02 DIAGNOSIS — R069 Unspecified abnormalities of breathing: Secondary | ICD-10-CM | POA: Diagnosis not present

## 2019-06-02 DIAGNOSIS — G600 Hereditary motor and sensory neuropathy: Secondary | ICD-10-CM | POA: Diagnosis not present

## 2019-06-02 DIAGNOSIS — M1611 Unilateral primary osteoarthritis, right hip: Secondary | ICD-10-CM | POA: Diagnosis not present

## 2019-06-02 DIAGNOSIS — R296 Repeated falls: Secondary | ICD-10-CM | POA: Diagnosis not present

## 2019-06-03 ENCOUNTER — Telehealth (HOSPITAL_COMMUNITY): Payer: Self-pay

## 2019-06-03 NOTE — Telephone Encounter (Signed)
Encounter complete. 

## 2019-06-04 ENCOUNTER — Ambulatory Visit (HOSPITAL_COMMUNITY)
Admission: RE | Admit: 2019-06-04 | Discharge: 2019-06-04 | Disposition: A | Discharge status: Home / Self Care | Payer: Medicare PPO | Source: Ambulatory Visit | Attending: Cardiology | Admitting: Cardiology

## 2019-06-04 ENCOUNTER — Other Ambulatory Visit: Payer: Self-pay

## 2019-06-04 DIAGNOSIS — R072 Precordial pain: Secondary | ICD-10-CM | POA: Diagnosis not present

## 2019-06-04 LAB — MYOCARDIAL PERFUSION IMAGING
LV dias vol: 173 mL (ref 62–150)
LV sys vol: 98 mL
Peak HR: 95 {beats}/min
Rest HR: 82 {beats}/min
SDS: 5
SRS: 11
SSS: 16
TID: 0.98

## 2019-06-04 MED ORDER — TECHNETIUM TC 99M TETROFOSMIN IV KIT
10.1000 | PACK | Freq: Once | INTRAVENOUS | Status: AC | PRN
  Administered 2019-06-04: 10.1 via INTRAVENOUS
  Filled 2019-06-04: qty 11

## 2019-06-04 MED ORDER — REGADENOSON 0.4 MG/5ML IV SOLN
0.4000 mg | Freq: Once | INTRAVENOUS | Status: AC
  Administered 2019-06-04: 0.4 mg via INTRAVENOUS

## 2019-06-04 MED ORDER — TECHNETIUM TC 99M TETROFOSMIN IV KIT
30.5000 | PACK | Freq: Once | INTRAVENOUS | Status: AC | PRN
  Administered 2019-06-04: 30.5 via INTRAVENOUS
  Filled 2019-06-04: qty 31

## 2019-06-05 DIAGNOSIS — K7581 Nonalcoholic steatohepatitis (NASH): Secondary | ICD-10-CM | POA: Diagnosis not present

## 2019-06-05 DIAGNOSIS — R296 Repeated falls: Secondary | ICD-10-CM | POA: Diagnosis not present

## 2019-06-05 DIAGNOSIS — H9190 Unspecified hearing loss, unspecified ear: Secondary | ICD-10-CM | POA: Diagnosis not present

## 2019-06-05 DIAGNOSIS — K7469 Other cirrhosis of liver: Secondary | ICD-10-CM | POA: Diagnosis not present

## 2019-06-05 DIAGNOSIS — G600 Hereditary motor and sensory neuropathy: Secondary | ICD-10-CM | POA: Diagnosis not present

## 2019-06-05 DIAGNOSIS — R188 Other ascites: Secondary | ICD-10-CM | POA: Diagnosis not present

## 2019-06-05 DIAGNOSIS — M1611 Unilateral primary osteoarthritis, right hip: Secondary | ICD-10-CM | POA: Diagnosis not present

## 2019-06-05 DIAGNOSIS — I255 Ischemic cardiomyopathy: Secondary | ICD-10-CM | POA: Diagnosis not present

## 2019-06-05 DIAGNOSIS — I2510 Atherosclerotic heart disease of native coronary artery without angina pectoris: Secondary | ICD-10-CM | POA: Diagnosis not present

## 2019-06-09 ENCOUNTER — Telehealth: Payer: Self-pay

## 2019-06-09 ENCOUNTER — Other Ambulatory Visit: Payer: Self-pay

## 2019-06-09 NOTE — Telephone Encounter (Signed)
Last filled: Furosemide 03/26/2019 #30 with 1 refill and Spironolactone 03/26/2019 #30 with 1 refill -during hospital admission.  LOV 04/25/2019 follow up Next appointment on 07/28/2019

## 2019-06-09 NOTE — Telephone Encounter (Signed)
[name] was given verbal order.

## 2019-06-09 NOTE — Telephone Encounter (Signed)
Erline Levine, PT with [facility] would like Dr. Synthia Innocent approval for re certification for continuation of PT. She can be reached at [phone] and it is ok to leave a voicemail. Thanks!

## 2019-06-09 NOTE — Telephone Encounter (Signed)
Agree with this. Thanks.  

## 2019-06-10 DIAGNOSIS — K7581 Nonalcoholic steatohepatitis (NASH): Secondary | ICD-10-CM | POA: Diagnosis not present

## 2019-06-10 DIAGNOSIS — I2510 Atherosclerotic heart disease of native coronary artery without angina pectoris: Secondary | ICD-10-CM | POA: Diagnosis not present

## 2019-06-10 DIAGNOSIS — G600 Hereditary motor and sensory neuropathy: Secondary | ICD-10-CM | POA: Diagnosis not present

## 2019-06-10 DIAGNOSIS — M1611 Unilateral primary osteoarthritis, right hip: Secondary | ICD-10-CM | POA: Diagnosis not present

## 2019-06-10 DIAGNOSIS — I255 Ischemic cardiomyopathy: Secondary | ICD-10-CM | POA: Diagnosis not present

## 2019-06-10 DIAGNOSIS — I252 Old myocardial infarction: Secondary | ICD-10-CM | POA: Diagnosis not present

## 2019-06-10 DIAGNOSIS — R188 Other ascites: Secondary | ICD-10-CM | POA: Diagnosis not present

## 2019-06-10 DIAGNOSIS — R296 Repeated falls: Secondary | ICD-10-CM | POA: Diagnosis not present

## 2019-06-10 DIAGNOSIS — K7469 Other cirrhosis of liver: Secondary | ICD-10-CM | POA: Diagnosis not present

## 2019-06-11 DIAGNOSIS — Z79891 Long term (current) use of opiate analgesic: Secondary | ICD-10-CM | POA: Diagnosis not present

## 2019-06-11 DIAGNOSIS — M545 Low back pain: Secondary | ICD-10-CM | POA: Diagnosis not present

## 2019-06-11 DIAGNOSIS — M542 Cervicalgia: Secondary | ICD-10-CM | POA: Diagnosis not present

## 2019-06-11 DIAGNOSIS — G894 Chronic pain syndrome: Secondary | ICD-10-CM | POA: Diagnosis not present

## 2019-06-11 MED ORDER — FUROSEMIDE 40 MG PO TABS: 40.0000 mg | ORAL_TABLET | Freq: Every day | ORAL | 1 refills | Status: DC

## 2019-06-11 MED ORDER — SPIRONOLACTONE 100 MG PO TABS: 100.0000 mg | ORAL_TABLET | Freq: Every day | ORAL | 1 refills | Status: DC

## 2019-06-13 DIAGNOSIS — I255 Ischemic cardiomyopathy: Secondary | ICD-10-CM | POA: Diagnosis not present

## 2019-06-13 DIAGNOSIS — R296 Repeated falls: Secondary | ICD-10-CM | POA: Diagnosis not present

## 2019-06-13 DIAGNOSIS — R188 Other ascites: Secondary | ICD-10-CM | POA: Diagnosis not present

## 2019-06-13 DIAGNOSIS — G600 Hereditary motor and sensory neuropathy: Secondary | ICD-10-CM | POA: Diagnosis not present

## 2019-06-13 DIAGNOSIS — K7581 Nonalcoholic steatohepatitis (NASH): Secondary | ICD-10-CM | POA: Diagnosis not present

## 2019-06-13 DIAGNOSIS — I252 Old myocardial infarction: Secondary | ICD-10-CM | POA: Diagnosis not present

## 2019-06-13 DIAGNOSIS — M1611 Unilateral primary osteoarthritis, right hip: Secondary | ICD-10-CM | POA: Diagnosis not present

## 2019-06-13 DIAGNOSIS — I2510 Atherosclerotic heart disease of native coronary artery without angina pectoris: Secondary | ICD-10-CM | POA: Diagnosis not present

## 2019-06-13 DIAGNOSIS — K7469 Other cirrhosis of liver: Secondary | ICD-10-CM | POA: Diagnosis not present

## 2019-06-14 DIAGNOSIS — M1611 Unilateral primary osteoarthritis, right hip: Secondary | ICD-10-CM | POA: Diagnosis not present

## 2019-06-14 DIAGNOSIS — G600 Hereditary motor and sensory neuropathy: Secondary | ICD-10-CM | POA: Diagnosis not present

## 2019-06-14 DIAGNOSIS — R069 Unspecified abnormalities of breathing: Secondary | ICD-10-CM | POA: Diagnosis not present

## 2019-06-14 DIAGNOSIS — R296 Repeated falls: Secondary | ICD-10-CM | POA: Diagnosis not present

## 2019-06-17 DIAGNOSIS — R296 Repeated falls: Secondary | ICD-10-CM | POA: Diagnosis not present

## 2019-06-17 DIAGNOSIS — I252 Old myocardial infarction: Secondary | ICD-10-CM | POA: Diagnosis not present

## 2019-06-17 DIAGNOSIS — R188 Other ascites: Secondary | ICD-10-CM | POA: Diagnosis not present

## 2019-06-17 DIAGNOSIS — I2510 Atherosclerotic heart disease of native coronary artery without angina pectoris: Secondary | ICD-10-CM | POA: Diagnosis not present

## 2019-06-17 DIAGNOSIS — I255 Ischemic cardiomyopathy: Secondary | ICD-10-CM | POA: Diagnosis not present

## 2019-06-17 DIAGNOSIS — M1611 Unilateral primary osteoarthritis, right hip: Secondary | ICD-10-CM | POA: Diagnosis not present

## 2019-06-17 DIAGNOSIS — K7581 Nonalcoholic steatohepatitis (NASH): Secondary | ICD-10-CM | POA: Diagnosis not present

## 2019-06-17 DIAGNOSIS — G600 Hereditary motor and sensory neuropathy: Secondary | ICD-10-CM | POA: Diagnosis not present

## 2019-06-17 DIAGNOSIS — K7469 Other cirrhosis of liver: Secondary | ICD-10-CM | POA: Diagnosis not present

## 2019-06-19 DIAGNOSIS — G600 Hereditary motor and sensory neuropathy: Secondary | ICD-10-CM | POA: Diagnosis not present

## 2019-06-19 DIAGNOSIS — K7469 Other cirrhosis of liver: Secondary | ICD-10-CM | POA: Diagnosis not present

## 2019-06-19 DIAGNOSIS — R296 Repeated falls: Secondary | ICD-10-CM | POA: Diagnosis not present

## 2019-06-19 DIAGNOSIS — I252 Old myocardial infarction: Secondary | ICD-10-CM | POA: Diagnosis not present

## 2019-06-19 DIAGNOSIS — M1611 Unilateral primary osteoarthritis, right hip: Secondary | ICD-10-CM | POA: Diagnosis not present

## 2019-06-19 DIAGNOSIS — I2510 Atherosclerotic heart disease of native coronary artery without angina pectoris: Secondary | ICD-10-CM | POA: Diagnosis not present

## 2019-06-19 DIAGNOSIS — R188 Other ascites: Secondary | ICD-10-CM | POA: Diagnosis not present

## 2019-06-19 DIAGNOSIS — I255 Ischemic cardiomyopathy: Secondary | ICD-10-CM | POA: Diagnosis not present

## 2019-06-19 DIAGNOSIS — K7581 Nonalcoholic steatohepatitis (NASH): Secondary | ICD-10-CM | POA: Diagnosis not present

## 2019-06-24 DIAGNOSIS — R188 Other ascites: Secondary | ICD-10-CM | POA: Diagnosis not present

## 2019-06-24 DIAGNOSIS — G600 Hereditary motor and sensory neuropathy: Secondary | ICD-10-CM | POA: Diagnosis not present

## 2019-06-24 DIAGNOSIS — I252 Old myocardial infarction: Secondary | ICD-10-CM | POA: Diagnosis not present

## 2019-06-24 DIAGNOSIS — R296 Repeated falls: Secondary | ICD-10-CM | POA: Diagnosis not present

## 2019-06-24 DIAGNOSIS — I255 Ischemic cardiomyopathy: Secondary | ICD-10-CM | POA: Diagnosis not present

## 2019-06-24 DIAGNOSIS — I2510 Atherosclerotic heart disease of native coronary artery without angina pectoris: Secondary | ICD-10-CM | POA: Diagnosis not present

## 2019-06-24 DIAGNOSIS — K7581 Nonalcoholic steatohepatitis (NASH): Secondary | ICD-10-CM | POA: Diagnosis not present

## 2019-06-24 DIAGNOSIS — K7469 Other cirrhosis of liver: Secondary | ICD-10-CM | POA: Diagnosis not present

## 2019-06-24 DIAGNOSIS — M1611 Unilateral primary osteoarthritis, right hip: Secondary | ICD-10-CM | POA: Diagnosis not present

## 2019-06-26 ENCOUNTER — Telehealth: Payer: Self-pay | Admitting: Family Medicine

## 2019-06-26 DIAGNOSIS — R188 Other ascites: Secondary | ICD-10-CM | POA: Diagnosis not present

## 2019-06-26 DIAGNOSIS — I255 Ischemic cardiomyopathy: Secondary | ICD-10-CM | POA: Diagnosis not present

## 2019-06-26 DIAGNOSIS — I252 Old myocardial infarction: Secondary | ICD-10-CM | POA: Diagnosis not present

## 2019-06-26 DIAGNOSIS — K7469 Other cirrhosis of liver: Secondary | ICD-10-CM | POA: Diagnosis not present

## 2019-06-26 DIAGNOSIS — I2510 Atherosclerotic heart disease of native coronary artery without angina pectoris: Secondary | ICD-10-CM | POA: Diagnosis not present

## 2019-06-26 DIAGNOSIS — R296 Repeated falls: Secondary | ICD-10-CM | POA: Diagnosis not present

## 2019-06-26 DIAGNOSIS — K7581 Nonalcoholic steatohepatitis (NASH): Secondary | ICD-10-CM | POA: Diagnosis not present

## 2019-06-26 DIAGNOSIS — G600 Hereditary motor and sensory neuropathy: Secondary | ICD-10-CM | POA: Diagnosis not present

## 2019-06-26 DIAGNOSIS — M1611 Unilateral primary osteoarthritis, right hip: Secondary | ICD-10-CM | POA: Diagnosis not present

## 2019-06-26 NOTE — Telephone Encounter (Signed)
Stacy @ [facility] called  Pt has had a weight gain of 4lbs from Tuesday 1/19 to today 1/21. Pt has had problem before with fluid retention in stomach She stated she is not a nurse but she listened to his belly and it sound gurley  Vitals are good

## 2019-06-26 NOTE — Telephone Encounter (Signed)
Plz ensure he's taking Aldactone 147m and Lasix 40 mg daily. Any fever, new abd pain? What is his current weight? Noted 255lbs at cardiology office 05/22/2019.  Recommend increased lasix to 46mbid for 3 days then [place].  Next GI appt 07/15/2019.  He may need another paracentesis.

## 2019-06-26 NOTE — Telephone Encounter (Addendum)
I left v/m for Stacy with [facility] to call [place]. Stacy called back and pt is taking both diurectics, aldactone 100 mg and lasix 40 mg daily. Pt was not having any abd pain and no fever. Stacy pushed on abd and the abd is not hard and does not appear as swollen as last time. Pt's weight was 252 lbs on 06/24/19. And today pt's weight is 256.6 lbs. Marzetta Board will let pt's family know to increase lasix to 40 mg bid for 3 days and then call us with update. Marzetta Board said she will see pt again on 07/01/19 and call with update and she will let family know to call with update prior to 07/01/19 if needed. FYI to Dr Darnell Level.

## 2019-06-26 NOTE — Telephone Encounter (Signed)
Please triage this patient.  PCP is Dr. Darnell Level.

## 2019-07-01 DIAGNOSIS — G600 Hereditary motor and sensory neuropathy: Secondary | ICD-10-CM | POA: Diagnosis not present

## 2019-07-01 DIAGNOSIS — K7469 Other cirrhosis of liver: Secondary | ICD-10-CM | POA: Diagnosis not present

## 2019-07-01 DIAGNOSIS — I255 Ischemic cardiomyopathy: Secondary | ICD-10-CM | POA: Diagnosis not present

## 2019-07-01 DIAGNOSIS — R188 Other ascites: Secondary | ICD-10-CM | POA: Diagnosis not present

## 2019-07-01 DIAGNOSIS — I2510 Atherosclerotic heart disease of native coronary artery without angina pectoris: Secondary | ICD-10-CM | POA: Diagnosis not present

## 2019-07-01 DIAGNOSIS — K7581 Nonalcoholic steatohepatitis (NASH): Secondary | ICD-10-CM | POA: Diagnosis not present

## 2019-07-01 DIAGNOSIS — M1611 Unilateral primary osteoarthritis, right hip: Secondary | ICD-10-CM | POA: Diagnosis not present

## 2019-07-01 DIAGNOSIS — R296 Repeated falls: Secondary | ICD-10-CM | POA: Diagnosis not present

## 2019-07-01 DIAGNOSIS — I252 Old myocardial infarction: Secondary | ICD-10-CM | POA: Diagnosis not present

## 2019-07-03 DIAGNOSIS — G600 Hereditary motor and sensory neuropathy: Secondary | ICD-10-CM | POA: Diagnosis not present

## 2019-07-03 DIAGNOSIS — R296 Repeated falls: Secondary | ICD-10-CM | POA: Diagnosis not present

## 2019-07-03 DIAGNOSIS — R069 Unspecified abnormalities of breathing: Secondary | ICD-10-CM | POA: Diagnosis not present

## 2019-07-03 DIAGNOSIS — M1611 Unilateral primary osteoarthritis, right hip: Secondary | ICD-10-CM | POA: Diagnosis not present

## 2019-07-08 ENCOUNTER — Telehealth: Payer: Self-pay | Admitting: Family Medicine

## 2019-07-08 DIAGNOSIS — I2510 Atherosclerotic heart disease of native coronary artery without angina pectoris: Secondary | ICD-10-CM | POA: Diagnosis not present

## 2019-07-08 DIAGNOSIS — I252 Old myocardial infarction: Secondary | ICD-10-CM | POA: Diagnosis not present

## 2019-07-08 DIAGNOSIS — R296 Repeated falls: Secondary | ICD-10-CM | POA: Diagnosis not present

## 2019-07-08 DIAGNOSIS — G600 Hereditary motor and sensory neuropathy: Secondary | ICD-10-CM | POA: Diagnosis not present

## 2019-07-08 DIAGNOSIS — M1611 Unilateral primary osteoarthritis, right hip: Secondary | ICD-10-CM | POA: Diagnosis not present

## 2019-07-08 DIAGNOSIS — K7469 Other cirrhosis of liver: Secondary | ICD-10-CM | POA: Diagnosis not present

## 2019-07-08 DIAGNOSIS — I255 Ischemic cardiomyopathy: Secondary | ICD-10-CM | POA: Diagnosis not present

## 2019-07-08 DIAGNOSIS — K7581 Nonalcoholic steatohepatitis (NASH): Secondary | ICD-10-CM | POA: Diagnosis not present

## 2019-07-08 DIAGNOSIS — R188 Other ascites: Secondary | ICD-10-CM | POA: Diagnosis not present

## 2019-07-08 NOTE — Telephone Encounter (Addendum)
Lvm for Stacy informing her Dr. Darnell Level is giving verbal orders to extend PT services requested for pt.

## 2019-07-08 NOTE — Telephone Encounter (Signed)
Stacy, [place], called.  Patient needs an extension for physical therapy 1 x a week for 4 weeks.  Patient continues to have fluctuation in weight, but it's hard for Stacy to tell because patient doesn't weight daily. Patient was 255 today. Patient was 253 last week.  Patient's stomach looks fine and he's not complaining of shortness of breath. Detailed message can be left on Stacy's voice mail.

## 2019-07-08 NOTE — Telephone Encounter (Signed)
Agree with extending PT thank you

## 2019-07-09 DIAGNOSIS — L732 Hidradenitis suppurativa: Secondary | ICD-10-CM | POA: Diagnosis not present

## 2019-07-09 DIAGNOSIS — Z87891 Personal history of nicotine dependence: Secondary | ICD-10-CM | POA: Diagnosis not present

## 2019-07-09 DIAGNOSIS — D696 Thrombocytopenia, unspecified: Secondary | ICD-10-CM | POA: Diagnosis not present

## 2019-07-09 DIAGNOSIS — M1611 Unilateral primary osteoarthritis, right hip: Secondary | ICD-10-CM | POA: Diagnosis not present

## 2019-07-09 DIAGNOSIS — K7460 Unspecified cirrhosis of liver: Secondary | ICD-10-CM | POA: Diagnosis not present

## 2019-07-09 DIAGNOSIS — Z888 Allergy status to other drugs, medicaments and biological substances status: Secondary | ICD-10-CM | POA: Diagnosis not present

## 2019-07-09 DIAGNOSIS — E669 Obesity, unspecified: Secondary | ICD-10-CM | POA: Diagnosis not present

## 2019-07-09 DIAGNOSIS — Z6841 Body Mass Index (BMI) 40.0 and over, adult: Secondary | ICD-10-CM | POA: Diagnosis not present

## 2019-07-09 DIAGNOSIS — Z885 Allergy status to narcotic agent status: Secondary | ICD-10-CM | POA: Diagnosis not present

## 2019-07-15 ENCOUNTER — Other Ambulatory Visit: Payer: Self-pay | Admitting: Family Medicine

## 2019-07-15 ENCOUNTER — Ambulatory Visit: Payer: Medicare PPO | Admitting: Gastroenterology

## 2019-07-15 DIAGNOSIS — K7581 Nonalcoholic steatohepatitis (NASH): Secondary | ICD-10-CM | POA: Diagnosis not present

## 2019-07-15 DIAGNOSIS — R069 Unspecified abnormalities of breathing: Secondary | ICD-10-CM | POA: Diagnosis not present

## 2019-07-15 DIAGNOSIS — I2510 Atherosclerotic heart disease of native coronary artery without angina pectoris: Secondary | ICD-10-CM | POA: Diagnosis not present

## 2019-07-15 DIAGNOSIS — K7469 Other cirrhosis of liver: Secondary | ICD-10-CM | POA: Diagnosis not present

## 2019-07-15 DIAGNOSIS — I255 Ischemic cardiomyopathy: Secondary | ICD-10-CM | POA: Diagnosis not present

## 2019-07-15 DIAGNOSIS — R188 Other ascites: Secondary | ICD-10-CM | POA: Diagnosis not present

## 2019-07-15 DIAGNOSIS — M1611 Unilateral primary osteoarthritis, right hip: Secondary | ICD-10-CM | POA: Diagnosis not present

## 2019-07-15 DIAGNOSIS — R296 Repeated falls: Secondary | ICD-10-CM | POA: Diagnosis not present

## 2019-07-15 DIAGNOSIS — G600 Hereditary motor and sensory neuropathy: Secondary | ICD-10-CM | POA: Diagnosis not present

## 2019-07-15 DIAGNOSIS — I252 Old myocardial infarction: Secondary | ICD-10-CM | POA: Diagnosis not present

## 2019-07-15 NOTE — Telephone Encounter (Signed)
Bentyl Last filled:  06/24/18, #30 Last OV:  04/25/19, cirrhosis Next OV:  07/28/19, 3 mo f/u

## 2019-07-22 DIAGNOSIS — K7469 Other cirrhosis of liver: Secondary | ICD-10-CM | POA: Diagnosis not present

## 2019-07-22 DIAGNOSIS — I110 Hypertensive heart disease with heart failure: Secondary | ICD-10-CM

## 2019-07-22 DIAGNOSIS — I714 Abdominal aortic aneurysm, without rupture: Secondary | ICD-10-CM

## 2019-07-22 DIAGNOSIS — R188 Other ascites: Secondary | ICD-10-CM | POA: Diagnosis not present

## 2019-07-22 DIAGNOSIS — K7581 Nonalcoholic steatohepatitis (NASH): Secondary | ICD-10-CM | POA: Diagnosis not present

## 2019-07-22 DIAGNOSIS — E1136 Type 2 diabetes mellitus with diabetic cataract: Secondary | ICD-10-CM

## 2019-07-22 DIAGNOSIS — E538 Deficiency of other specified B group vitamins: Secondary | ICD-10-CM

## 2019-07-22 DIAGNOSIS — M47892 Other spondylosis, cervical region: Secondary | ICD-10-CM

## 2019-07-22 DIAGNOSIS — R296 Repeated falls: Secondary | ICD-10-CM | POA: Diagnosis not present

## 2019-07-22 DIAGNOSIS — D696 Thrombocytopenia, unspecified: Secondary | ICD-10-CM

## 2019-07-22 DIAGNOSIS — E11610 Type 2 diabetes mellitus with diabetic neuropathic arthropathy: Secondary | ICD-10-CM

## 2019-07-22 DIAGNOSIS — K219 Gastro-esophageal reflux disease without esophagitis: Secondary | ICD-10-CM

## 2019-07-22 DIAGNOSIS — G894 Chronic pain syndrome: Secondary | ICD-10-CM

## 2019-07-22 DIAGNOSIS — D539 Nutritional anemia, unspecified: Secondary | ICD-10-CM

## 2019-07-22 DIAGNOSIS — E559 Vitamin D deficiency, unspecified: Secondary | ICD-10-CM

## 2019-07-22 DIAGNOSIS — M109 Gout, unspecified: Secondary | ICD-10-CM

## 2019-07-22 DIAGNOSIS — I252 Old myocardial infarction: Secondary | ICD-10-CM | POA: Diagnosis not present

## 2019-07-22 DIAGNOSIS — Z6838 Body mass index (BMI) 38.0-38.9, adult: Secondary | ICD-10-CM

## 2019-07-22 DIAGNOSIS — M1611 Unilateral primary osteoarthritis, right hip: Secondary | ICD-10-CM | POA: Diagnosis not present

## 2019-07-22 DIAGNOSIS — I5030 Unspecified diastolic (congestive) heart failure: Secondary | ICD-10-CM

## 2019-07-22 DIAGNOSIS — J449 Chronic obstructive pulmonary disease, unspecified: Secondary | ICD-10-CM

## 2019-07-22 DIAGNOSIS — G600 Hereditary motor and sensory neuropathy: Secondary | ICD-10-CM | POA: Diagnosis not present

## 2019-07-22 DIAGNOSIS — I255 Ischemic cardiomyopathy: Secondary | ICD-10-CM | POA: Diagnosis not present

## 2019-07-22 DIAGNOSIS — I2510 Atherosclerotic heart disease of native coronary artery without angina pectoris: Secondary | ICD-10-CM | POA: Diagnosis not present

## 2019-07-28 ENCOUNTER — Encounter: Payer: Self-pay | Admitting: Family Medicine

## 2019-07-28 ENCOUNTER — Other Ambulatory Visit: Payer: Self-pay

## 2019-07-28 ENCOUNTER — Other Ambulatory Visit: Payer: Self-pay | Admitting: Internal Medicine

## 2019-07-28 ENCOUNTER — Ambulatory Visit (INDEPENDENT_AMBULATORY_CARE_PROVIDER_SITE_OTHER): Payer: Medicare PPO | Admitting: Family Medicine

## 2019-07-28 ENCOUNTER — Other Ambulatory Visit: Payer: Self-pay | Admitting: Family Medicine

## 2019-07-28 VITALS — BP 117/72 | HR 82 | Temp 98.3°F | Ht 69.0 in | Wt 254.0 lb

## 2019-07-28 DIAGNOSIS — I2510 Atherosclerotic heart disease of native coronary artery without angina pectoris: Secondary | ICD-10-CM | POA: Diagnosis not present

## 2019-07-28 DIAGNOSIS — E785 Hyperlipidemia, unspecified: Secondary | ICD-10-CM | POA: Diagnosis not present

## 2019-07-28 DIAGNOSIS — M1611 Unilateral primary osteoarthritis, right hip: Secondary | ICD-10-CM | POA: Diagnosis not present

## 2019-07-28 DIAGNOSIS — K7460 Unspecified cirrhosis of liver: Secondary | ICD-10-CM

## 2019-07-28 DIAGNOSIS — D696 Thrombocytopenia, unspecified: Secondary | ICD-10-CM | POA: Diagnosis not present

## 2019-07-28 DIAGNOSIS — R188 Other ascites: Secondary | ICD-10-CM

## 2019-07-28 DIAGNOSIS — J449 Chronic obstructive pulmonary disease, unspecified: Secondary | ICD-10-CM | POA: Diagnosis not present

## 2019-07-28 DIAGNOSIS — R5382 Chronic fatigue, unspecified: Secondary | ICD-10-CM

## 2019-07-28 NOTE — Assessment & Plan Note (Signed)
Chronic. He is only on RYR 1235m daily. Reviewed options with patient as discussed by cardiology last visit - zetia vs repatha. He has not yet checked FLP ordered by cards 04/2019 - will schedule in office for fasting labwork then decide on treatment plan.

## 2019-07-28 NOTE — Telephone Encounter (Signed)
Robaxin Last filled:   04/13/19, #30 Last OV:  07/28/19, 3 mo f/u Next OV:  none

## 2019-07-28 NOTE — Progress Notes (Signed)
Virtual visit completed through [employer]. Due to national recommendations of social distancing due to COVID-19, a virtual visit is felt to be most appropriate for this patient at this time. Reviewed limitations of a virtual visit.   Patient location: home Provider location: [place] [facility], office If any vitals were documented, they were collected by patient at home unless specified below.    BP 117/72   Pulse 82   Temp 98.3 F (36.8 C)   Ht 5' 9"  (1.753 m)   Wt 254 lb (115.2 kg)   SpO2 94%   BMI 37.51 kg/m    CC: 3 mo f/u visit, wheezing Subjective:    Patient ID: [name], male    DOB: [birth year], 64 y.o.   MRN: [id number]  HPI: [name] is a 64 y.o. male presenting on 07/28/2019 for Follow-up (3 mo f/u.) and Wheezing (C/o wheezing.  Started about 1 wk ago. )   1 wk h/o increased wheezing noted associated with chest congestion. No cough, fevers/chills, no recent prednisone use. Ongoing exertional dyspnea and fatigue. Taking albuterol 1-2 times a daily. No recent prednisone use. No leg swelling.  Known COPD only on albuterol PRN.  Ongoing nausea for 6 months, managed with zofran and dicyclomine. Endorses emesis related to diet. No bowel changes (diarrhea/constipation). Endorses darker stools since iron started.   He continues spironolactone 168m daily and lasix 477mdaily.  Weight largely stable.  Saw cardiology s/p stress nuclear test 06/04/2019 showing stable large defect consistent with prior MI with peri-infarct ischemia, started on avapro, planned consideration for repatha vs zetia. He continues RYR 120023maily.   [facility] Shi[facility]erson Fuelrly [facility] - he is aware he is at highest risk for infection but has decided to proceed with surgery given marked debilitating pain he remains in due to end stage hip DJD. Hip replacement planned 08/25/2019, preop with covid test 08/18/2019.   Continues receiving HH PT through the end of this  month.       Relevant past medical, surgical, family and social history reviewed and updated as indicated. Interim medical history since our last visit reviewed. Allergies and medications reviewed and updated. Outpatient Medications Prior to Visit  Medication Sig Dispense Refill  . albuterol (PROVENTIL HFA;VENTOLIN HFA) 108 (90 Base) MCG/ACT inhaler Inhale 2 puffs into the lungs every 6 (six) hours as needed for wheezing or shortness of breath. 1 Inhaler 0  . albuterol (PROVENTIL) (2.5 MG/3ML) 0.083% nebulizer solution USE 1 VIAL PER NEBULIZER EVERY 6 HRS AS NEEDED FOR WHEEZING (Patient taking differently: Take 2.5 mg by nebulization every 6 (six) hours as needed for shortness of breath. ) 75 mL 6  . aspirin 81 MG tablet Take 1 tablet (81 mg total) by mouth daily.    . B Complex-C (SUPER B COMPLEX PO) Take 1 tablet by mouth daily.    . Ca Phosphate-Cholecalciferol [phone] MG-UNIT TABS Take 1 tablet by mouth daily.     . cholecalciferol 2000 units TABS Take 2,000 Units by mouth daily.    . Cobalamin Combinations (B-12) 1000-400 MCG SUBL Take 1 tablet by mouth daily.     . dMarland Kitchencyclomine (BENTYL) 20 MG tablet TAKE 1 TABLET BY MOUTH 3 TIMES DAILY BEFORE MEALS. AS NEEDED FOR ABDOMINAL PAIN 30 tablet 1  . DULoxetine (CYMBALTA) 60 MG capsule Take 1 capsule (60 mg total) by mouth daily. 90 capsule 3  . fentaNYL (DURAGESIC - DOSED MCG/HR) 50 MCG/HR Place 1 patch (50  mcg total) every other day onto the skin. 15 patch 0  . ferrous sulfate 325 (65 FE) MG tablet Take 1 tablet (325 mg total) by mouth every other day.    . fluticasone (FLONASE) 50 MCG/ACT nasal spray Place 2 sprays into both nostrils daily as needed for allergies.     . folic acid (FOLVITE) 1 MG tablet Take 1 tablet (1 mg total) by mouth daily. 90 tablet 3  . furosemide (LASIX) 40 MG tablet Take 1 tablet (40 mg total) by mouth daily. 90 tablet 1  . irbesartan (AVAPRO) 75 MG tablet Take 1 tablet (75 mg total) by mouth daily. 90 tablet 3  .  Lactulose 20 GM/30ML SOLN Take 30 mLs (20 g total) by mouth 3 (three) times daily. 2700 mL 5  . methocarbamol (ROBAXIN) 500 MG tablet TAKE 1 TABLET (500 MG TOTAL) BY MOUTH AT BEDTIME. 30 tablet 0  . metoprolol succinate (TOPROL-XL) 25 MG 24 hr tablet Take 0.5 tablets (12.5 mg total) by mouth daily. 30 tablet 1  . Misc Natural Products (TART CHERRY ADVANCED) CAPS Take 1 capsule by mouth 2 (two) times daily.    . nitroGLYCERIN (NITROSTAT) 0.4 MG SL tablet Place 1 tablet (0.4 mg total) under the tongue every 5 (five) minutes as needed for chest pain. 25 tablet 12  . nystatin cream (MYCOSTATIN) Apply 1 application topically 2 (two) times daily. 80 g 0  . omeprazole (PRILOSEC) 40 MG capsule TAKE 1 CAPSULE BY MOUTH EVERY DAY (Patient taking differently: Take 40 mg by mouth daily. ) 30 capsule 11  . ondansetron (ZOFRAN-ODT) 4 MG disintegrating tablet Take 1 tablet (4 mg total) by mouth every 8 (eight) hours as needed for nausea or vomiting. Day supply per insurance. 20 tablet 3  . oxyCODONE (ROXICODONE) 15 MG immediate release tablet Take 1 tablet (15 mg total) by mouth every 4 (four) hours as needed. (Patient taking differently: Take 90 mg by mouth daily. Take 6 tablets by mouth daily per patient) 90 tablet 0  . Red Yeast Rice 600 MG CAPS Take 2 capsules by mouth daily.    [name] spironolactone (ALDACTONE) 100 MG tablet Take 1 tablet (100 mg total) by mouth daily. 90 tablet 1  . vitamin B-12 (CYANOCOBALAMIN) 500 MCG tablet Take 1 tablet (500 mcg total) by mouth every Monday, Wednesday, and Friday.     No facility-administered medications prior to visit.     Per HPI unless specifically indicated in ROS section below Review of Systems Objective:    BP 117/72   Pulse 82   Temp 98.3 F (36.8 C)   Ht 5' 9"  (1.753 m)   Wt 254 lb (115.2 kg)   SpO2 94%   BMI 37.51 kg/m   Wt Readings from Last 3 Encounters:  07/28/19 254 lb (115.2 kg)  06/04/19 255 lb (115.7 kg)  05/22/19 255 lb (115.7 kg)      Physical exam: Gen: alert, NAD, chronically ill appearing, laying in bed Pulm: speaks in complete sentences without increased work of breathing, no obvious wheezing or cough present Psych: normal mood, normal thought content      Lab Results  Component Value Date   HGBA1C 4.8 12/10/2018    Assessment & Plan:   Problem List Items Addressed This Visit    Thrombocytopenia (HCC)   Severe obesity (BMI 35.0-39.9) with comorbidity (HCC)   Osteoarthritis of right hip - Primary    Known end stage R hip arthritis causing debilitating pain. Fortunately planning to proceed with  hip replacement surgery at [facility]. He is aware of high risk of procedure.       HLD (hyperlipidemia)    Chronic. He is only on RYR 1268m daily. Reviewed options with patient as discussed by cardiology last visit - zetia vs repatha. He has not yet checked FLP ordered by cards 04/2019 - will schedule in office for fasting labwork then decide on treatment plan.       Relevant Orders   Comprehensive metabolic panel   Lipid panel   COPD (chronic obstructive pulmonary disease) (HCC)    Chronic, stable on PRN albuterol, using about once a day. No obvious signs of COPD exacerbation at this time (no increased sputum production or increased cough or dyspnea from baseline). Discussed albuterol use. Consider daily rescue inhaler use.       Cirrhosis of liver with ascites ([place]    Appreciate GI care. Update fasting labs when he comes into office next - [place]. Last abd MRI was 05/2019      Relevant Orders   Comprehensive metabolic panel   CBC with Differential/Platelet   Protime-INR   Chronic fatigue   CAD (coronary artery disease)       No orders of the defined types were placed in this encounter.  Orders Placed This Encounter  Procedures  . Comprehensive metabolic panel    Standing Status:   Future    Standing Expiration Date:   07/27/2020  . Lipid panel    Standing Status:    Future    Standing Expiration Date:   07/27/2020  . CBC with Differential/Platelet    Standing Status:   Future    Standing Expiration Date:   07/27/2020  . Protime-INR    Standing Status:   Future    Standing Expiration Date:   07/27/2020    I discussed the assessment and treatment plan with the patient. The patient was provided an opportunity to ask questions and all were answered. The patient agreed with the plan and demonstrated an understanding of the instructions. The patient was advised to call back or seek an in-person evaluation if the symptoms worsen or if the condition fails to improve as anticipated.  Follow up plan: Return in about 4 months (around 11/25/2019) for follow up visit.  JRia Bush MD

## 2019-07-28 NOTE — Assessment & Plan Note (Signed)
Known end stage R hip arthritis causing debilitating pain. Fortunately planning to proceed with hip replacement surgery at [facility]. He is aware of high risk of procedure.

## 2019-07-28 NOTE — Assessment & Plan Note (Signed)
Chronic, stable on PRN albuterol, using about once a day. No obvious signs of COPD exacerbation at this time (no increased sputum production or increased cough or dyspnea from baseline). Discussed albuterol use. Consider daily rescue inhaler use.

## 2019-07-28 NOTE — Assessment & Plan Note (Addendum)
Appreciate GI care. Update fasting labs when he comes into office next - will have [place] call to schedule this. Last abd MRI was 05/2019

## 2019-07-29 ENCOUNTER — Ambulatory Visit: Payer: Medicare PPO | Admitting: Gastroenterology

## 2019-07-29 ENCOUNTER — Other Ambulatory Visit: Payer: Self-pay

## 2019-07-29 ENCOUNTER — Other Ambulatory Visit: Payer: Medicare PPO

## 2019-07-29 ENCOUNTER — Telehealth: Payer: Self-pay

## 2019-07-29 DIAGNOSIS — R188 Other ascites: Secondary | ICD-10-CM | POA: Diagnosis not present

## 2019-07-29 DIAGNOSIS — I2510 Atherosclerotic heart disease of native coronary artery without angina pectoris: Secondary | ICD-10-CM | POA: Diagnosis not present

## 2019-07-29 DIAGNOSIS — K7581 Nonalcoholic steatohepatitis (NASH): Secondary | ICD-10-CM | POA: Diagnosis not present

## 2019-07-29 DIAGNOSIS — R0609 Other forms of dyspnea: Secondary | ICD-10-CM

## 2019-07-29 DIAGNOSIS — G600 Hereditary motor and sensory neuropathy: Secondary | ICD-10-CM | POA: Diagnosis not present

## 2019-07-29 DIAGNOSIS — R0600 Dyspnea, unspecified: Secondary | ICD-10-CM

## 2019-07-29 DIAGNOSIS — I255 Ischemic cardiomyopathy: Secondary | ICD-10-CM | POA: Diagnosis not present

## 2019-07-29 DIAGNOSIS — R296 Repeated falls: Secondary | ICD-10-CM | POA: Diagnosis not present

## 2019-07-29 DIAGNOSIS — K7469 Other cirrhosis of liver: Secondary | ICD-10-CM | POA: Diagnosis not present

## 2019-07-29 DIAGNOSIS — M1611 Unilateral primary osteoarthritis, right hip: Secondary | ICD-10-CM | POA: Diagnosis not present

## 2019-07-29 DIAGNOSIS — I252 Old myocardial infarction: Secondary | ICD-10-CM | POA: Diagnosis not present

## 2019-07-29 NOTE — Telephone Encounter (Signed)
Recommend increasing lasix to BID dosing for 3 days then return to once daily dosing.

## 2019-07-29 NOTE — Telephone Encounter (Signed)
Erline Levine, physical therapist with [facility], called asking for verbal orders to re certify patient for more P.T services, more so for maintenance before he has hip replacement surgery.  She also wanted to note that patient weighed today at 264 lb with shoes on, weighed 7 days ago at 254 lb with shoes off. Erline Levine has noted that his belly looks more rounder to her. Patient is not complaining of any new issues. She was concerned that patient is retaining fluid in his belly.

## 2019-07-29 NOTE — Telephone Encounter (Signed)
Asked Dr. Darnell Level about verbal order for PT services, he agrees.    Spoke with Erline Levine informing her Dr. Darnell Level is giving verbal orders for PT services requested.   Also, relayed Dr. Synthia Innocent instructions for Lasix for the pt.  She verbalizes understanding.  Told her I will inform pt.   Left message on vm per dpr relaying Dr. Synthia Innocent message.

## 2019-07-29 NOTE — Telephone Encounter (Signed)
Stacey's CB is [phone], if she does not answer we can leave a detailed message on her voicemail.

## 2019-07-30 ENCOUNTER — Other Ambulatory Visit: Payer: Self-pay

## 2019-07-30 ENCOUNTER — Other Ambulatory Visit (INDEPENDENT_AMBULATORY_CARE_PROVIDER_SITE_OTHER): Payer: Medicare PPO

## 2019-07-30 DIAGNOSIS — R0600 Dyspnea, unspecified: Secondary | ICD-10-CM

## 2019-07-30 DIAGNOSIS — R0609 Other forms of dyspnea: Secondary | ICD-10-CM

## 2019-07-30 DIAGNOSIS — K7460 Unspecified cirrhosis of liver: Secondary | ICD-10-CM

## 2019-07-30 DIAGNOSIS — E785 Hyperlipidemia, unspecified: Secondary | ICD-10-CM

## 2019-07-30 DIAGNOSIS — R188 Other ascites: Secondary | ICD-10-CM

## 2019-07-30 LAB — COMPREHENSIVE METABOLIC PANEL
ALT: 13 U/L (ref 0–53)
AST: 21 U/L (ref 0–37)
Albumin: 2.9 g/dL — ABNORMAL LOW (ref 3.5–5.2)
Alkaline Phosphatase: 156 U/L — ABNORMAL HIGH (ref 39–117)
BUN: 21 mg/dL (ref 6–23)
CO2: 28 mEq/L (ref 19–32)
Calcium: 8.6 mg/dL (ref 8.4–10.5)
Chloride: 100 mEq/L (ref 96–112)
Creatinine, Ser: 1.02 mg/dL (ref 0.40–1.50)
GFR: 73.44 mL/min (ref >60.00)
Glucose, Bld: 110 mg/dL — ABNORMAL HIGH (ref 70–99)
Potassium: 4.4 mEq/L (ref 3.5–5.1)
Sodium: 136 mEq/L (ref 135–145)
Total Bilirubin: 1.2 mg/dL (ref 0.2–1.2)
Total Protein: 6.9 g/dL (ref 6.0–8.3)

## 2019-07-30 LAB — CBC WITH DIFFERENTIAL/PLATELET
Basophils Absolute: 0 10*3/uL (ref 0.0–0.1)
Basophils Relative: 0.8 % (ref 0.0–3.0)
Eosinophils Absolute: 0.3 10*3/uL (ref 0.0–0.7)
Eosinophils Relative: 4.9 % (ref 0.0–5.0)
HCT: 37.5 % — ABNORMAL LOW (ref 39.0–52.0)
Hemoglobin: 12.6 g/dL — ABNORMAL LOW (ref 13.0–17.0)
Lymphocytes Relative: 26.5 % (ref 12.0–46.0)
Lymphs Abs: 1.4 10*3/uL (ref 0.7–4.0)
MCHC: 33.5 g/dL (ref 30.0–36.0)
MCV: 106.2 fl — ABNORMAL HIGH (ref 78.0–100.0)
Monocytes Absolute: 0.4 10*3/uL (ref 0.1–1.0)
Monocytes Relative: 7.4 % (ref 3.0–12.0)
Neutro Abs: 3.2 10*3/uL (ref 1.4–7.7)
Neutrophils Relative %: 60.4 % (ref 43.0–77.0)
Platelets: 61 10*3/uL — ABNORMAL LOW (ref 150.0–400.0)
RBC: 3.53 Mil/uL — ABNORMAL LOW (ref 4.22–5.81)
RDW: 16 % — ABNORMAL HIGH (ref 11.5–15.5)
WBC: 5.3 10*3/uL (ref 4.0–10.5)

## 2019-07-30 LAB — LIPID PANEL
Cholesterol: 146 mg/dL (ref 0–200)
HDL: 45.1 mg/dL (ref >39.00)
LDL Cholesterol: 85 mg/dL (ref 0–99)
NonHDL: 100.74
Total CHOL/HDL Ratio: 3
Triglycerides: 77 mg/dL (ref 0.0–149.0)
VLDL: 15.4 mg/dL (ref 0.0–40.0)

## 2019-07-30 LAB — PROTIME-INR
INR: 1.2 ratio — ABNORMAL HIGH (ref 0.8–1.0)
Prothrombin Time: 13.4 s — ABNORMAL HIGH (ref 9.6–13.1)

## 2019-07-30 LAB — BRAIN NATRIURETIC PEPTIDE: Pro B Natriuretic peptide (BNP): 51 pg/mL (ref 0.0–100.0)

## 2019-07-30 NOTE — Telephone Encounter (Signed)
Spoke with pt's wife, [name] (on dpr), confirming she got my vm.  Says she did and verbalizes understanding.

## 2019-08-03 DIAGNOSIS — M1611 Unilateral primary osteoarthritis, right hip: Secondary | ICD-10-CM | POA: Diagnosis not present

## 2019-08-03 DIAGNOSIS — R296 Repeated falls: Secondary | ICD-10-CM | POA: Diagnosis not present

## 2019-08-03 DIAGNOSIS — R069 Unspecified abnormalities of breathing: Secondary | ICD-10-CM | POA: Diagnosis not present

## 2019-08-03 DIAGNOSIS — G600 Hereditary motor and sensory neuropathy: Secondary | ICD-10-CM | POA: Diagnosis not present

## 2019-08-05 DIAGNOSIS — I255 Ischemic cardiomyopathy: Secondary | ICD-10-CM | POA: Diagnosis not present

## 2019-08-05 DIAGNOSIS — R188 Other ascites: Secondary | ICD-10-CM | POA: Diagnosis not present

## 2019-08-05 DIAGNOSIS — M1611 Unilateral primary osteoarthritis, right hip: Secondary | ICD-10-CM | POA: Diagnosis not present

## 2019-08-05 DIAGNOSIS — I2510 Atherosclerotic heart disease of native coronary artery without angina pectoris: Secondary | ICD-10-CM | POA: Diagnosis not present

## 2019-08-05 DIAGNOSIS — K7469 Other cirrhosis of liver: Secondary | ICD-10-CM | POA: Diagnosis not present

## 2019-08-05 DIAGNOSIS — K7581 Nonalcoholic steatohepatitis (NASH): Secondary | ICD-10-CM | POA: Diagnosis not present

## 2019-08-05 DIAGNOSIS — G600 Hereditary motor and sensory neuropathy: Secondary | ICD-10-CM | POA: Diagnosis not present

## 2019-08-05 DIAGNOSIS — R296 Repeated falls: Secondary | ICD-10-CM | POA: Diagnosis not present

## 2019-08-05 DIAGNOSIS — I252 Old myocardial infarction: Secondary | ICD-10-CM | POA: Diagnosis not present

## 2019-08-12 DIAGNOSIS — R069 Unspecified abnormalities of breathing: Secondary | ICD-10-CM | POA: Diagnosis not present

## 2019-08-12 DIAGNOSIS — R296 Repeated falls: Secondary | ICD-10-CM | POA: Diagnosis not present

## 2019-08-12 DIAGNOSIS — G600 Hereditary motor and sensory neuropathy: Secondary | ICD-10-CM | POA: Diagnosis not present

## 2019-08-12 DIAGNOSIS — M1611 Unilateral primary osteoarthritis, right hip: Secondary | ICD-10-CM | POA: Diagnosis not present

## 2019-08-13 DIAGNOSIS — I2510 Atherosclerotic heart disease of native coronary artery without angina pectoris: Secondary | ICD-10-CM | POA: Diagnosis not present

## 2019-08-13 DIAGNOSIS — R188 Other ascites: Secondary | ICD-10-CM | POA: Diagnosis not present

## 2019-08-13 DIAGNOSIS — K7469 Other cirrhosis of liver: Secondary | ICD-10-CM | POA: Diagnosis not present

## 2019-08-13 DIAGNOSIS — I255 Ischemic cardiomyopathy: Secondary | ICD-10-CM | POA: Diagnosis not present

## 2019-08-13 DIAGNOSIS — G600 Hereditary motor and sensory neuropathy: Secondary | ICD-10-CM | POA: Diagnosis not present

## 2019-08-13 DIAGNOSIS — M1611 Unilateral primary osteoarthritis, right hip: Secondary | ICD-10-CM | POA: Diagnosis not present

## 2019-08-13 DIAGNOSIS — I252 Old myocardial infarction: Secondary | ICD-10-CM | POA: Diagnosis not present

## 2019-08-13 DIAGNOSIS — R296 Repeated falls: Secondary | ICD-10-CM | POA: Diagnosis not present

## 2019-08-13 DIAGNOSIS — K7581 Nonalcoholic steatohepatitis (NASH): Secondary | ICD-10-CM | POA: Diagnosis not present

## 2019-08-15 DIAGNOSIS — M542 Cervicalgia: Secondary | ICD-10-CM | POA: Diagnosis not present

## 2019-08-15 DIAGNOSIS — K7581 Nonalcoholic steatohepatitis (NASH): Secondary | ICD-10-CM | POA: Diagnosis not present

## 2019-08-15 DIAGNOSIS — R296 Repeated falls: Secondary | ICD-10-CM | POA: Diagnosis not present

## 2019-08-15 DIAGNOSIS — M1611 Unilateral primary osteoarthritis, right hip: Secondary | ICD-10-CM | POA: Diagnosis not present

## 2019-08-15 DIAGNOSIS — I252 Old myocardial infarction: Secondary | ICD-10-CM | POA: Diagnosis not present

## 2019-08-15 DIAGNOSIS — I255 Ischemic cardiomyopathy: Secondary | ICD-10-CM | POA: Diagnosis not present

## 2019-08-15 DIAGNOSIS — M545 Low back pain: Secondary | ICD-10-CM | POA: Diagnosis not present

## 2019-08-15 DIAGNOSIS — I2510 Atherosclerotic heart disease of native coronary artery without angina pectoris: Secondary | ICD-10-CM | POA: Diagnosis not present

## 2019-08-15 DIAGNOSIS — G894 Chronic pain syndrome: Secondary | ICD-10-CM | POA: Diagnosis not present

## 2019-08-15 DIAGNOSIS — R188 Other ascites: Secondary | ICD-10-CM | POA: Diagnosis not present

## 2019-08-15 DIAGNOSIS — K7469 Other cirrhosis of liver: Secondary | ICD-10-CM | POA: Diagnosis not present

## 2019-08-15 DIAGNOSIS — Z79891 Long term (current) use of opiate analgesic: Secondary | ICD-10-CM | POA: Diagnosis not present

## 2019-08-15 DIAGNOSIS — G600 Hereditary motor and sensory neuropathy: Secondary | ICD-10-CM | POA: Diagnosis not present

## 2019-08-18 ENCOUNTER — Ambulatory Visit: Payer: Medicare PPO | Admitting: Gastroenterology

## 2019-08-18 DIAGNOSIS — Z20822 Contact with and (suspected) exposure to covid-19: Secondary | ICD-10-CM | POA: Diagnosis not present

## 2019-08-18 DIAGNOSIS — K7469 Other cirrhosis of liver: Secondary | ICD-10-CM | POA: Diagnosis not present

## 2019-08-18 DIAGNOSIS — Z01812 Encounter for preprocedural laboratory examination: Secondary | ICD-10-CM | POA: Diagnosis not present

## 2019-08-19 ENCOUNTER — Other Ambulatory Visit: Payer: Self-pay

## 2019-08-19 ENCOUNTER — Ambulatory Visit: Payer: Medicare PPO | Admitting: Gastroenterology

## 2019-08-19 VITALS — BP 142/88 | HR 81 | Temp 98.0°F | Ht 69.0 in | Wt 272.0 lb

## 2019-08-19 DIAGNOSIS — R635 Abnormal weight gain: Secondary | ICD-10-CM | POA: Diagnosis not present

## 2019-08-19 DIAGNOSIS — K7460 Unspecified cirrhosis of liver: Secondary | ICD-10-CM | POA: Diagnosis not present

## 2019-08-19 DIAGNOSIS — R188 Other ascites: Secondary | ICD-10-CM | POA: Diagnosis not present

## 2019-08-19 NOTE — Progress Notes (Signed)
[name] MD, MRCP(U.K) [address]  [place]  [place], [place] [address]  Main: [phone]  Fax: [phone]   Primary Care Physician: Ria Bush, MD  Primary Gastroenterologist:  Dr. [name]   Follow-up for liver cirrhosis  HPI: [name] is a 64 y.o. male   Summary of history :  He was initially referred and seen on 02/13/18 for cirrhosis of the liver. Not seen by me since 04/2018 - didn't follow up.He has been seen by [facility] in the past.. His Cirrhosis had been attriuted to NASH. [name] No tatoos, no alcohol , no military service, no blood transfusions. Used cocaine in his 20's, none recently. Weighed 370 lbs at his heaviest. Was always " big", he is not diabetic.   EGD 01/2017 -PHG, mild stricture was dilated.  RUQ USG 11/2017 : Small ascites, cirrhosis, Chronic dilation of CBD, mild dilation of PD, splenomegaly   CT angio of the abdomen 12/2017:Post endovascular repair of suprarenal abdominal aortic aneurysm without evidence of complication.Suspected hemodynamically significant stenosis involving the origin of the common hepatic artery, similar to the 12/2015 examination.Potential hemodynamically significant narrowing involving theproximal aspect of the right SFA.Findings of hepatic cirrhosis and portal venous hypertension with several varices about the gastroesophageal junction, splenomegaly and trace amount of intra-abdominal ascites. No discrete hyperenhancing hepatic lesions  02/20/18 ; EGD -normal -no eosinophils in esophagus.  02/13/18 : Cr 1.03,Hep A ab positive , albumin 3.0 ,alk phos 149 , Hbsab/ag,HCV ab , ANA,negative .Ceruloplasmin ,alpha 1 AT,Iron studies - normal   Smooth muscle antibody positive , elevated immunoglobulins G, AMA,LKM ab negative 03/19/18 : liver biopsy : steatosis , focal plasma cells, mild chronic cholestasis. Unmable to get a diagnosis due to burned out liver disease. 04/25/2019 : INR : 1.3 , MELD 10,Cr  0.94 05/15/2019: MRI liver cirrhotic liver, dilated CBD, no obstruction . Splenomegaly and trace ascites 05/12/2019: EGD: Normal  Hospital admission in 03/26/2019 for ascites, underwent paracentesis.    Interval history12/15/2020-08/19/2019  07/30/2019: CMP: Albumin 2.9, total bilirubin 1.2, creatinine 1.02, sodium 136.  Hemoglobin 12.6 g with an MCV of 106 PT/INR 1.2 platelet count of 61  Gained about 15 pounds of weight since his last visit.  Eating well.  Sleeping well.  No issues with bowel movements.  No confusion.  Says is trying to adhere to a low-salt diet.  Set up for surgery on his knee next week.      Current Outpatient Medications  Medication Sig Dispense Refill  . albuterol (PROVENTIL HFA;VENTOLIN HFA) 108 (90 Base) MCG/ACT inhaler Inhale 2 puffs into the lungs every 6 (six) hours as needed for wheezing or shortness of breath. 1 Inhaler 0  . albuterol (PROVENTIL) (2.5 MG/3ML) 0.083% nebulizer solution USE 1 VIAL PER NEBULIZER EVERY 6 HRS AS NEEDED FOR WHEEZING (Patient taking differently: Take 2.5 mg by nebulization every 6 (six) hours as needed for shortness of breath. ) 75 mL 6  . aspirin 81 MG tablet Take 1 tablet (81 mg total) by mouth daily.    . B Complex-C (SUPER B COMPLEX PO) Take 1 tablet by mouth daily.    . Ca Phosphate-Cholecalciferol [phone] MG-UNIT TABS Take 1 tablet by mouth daily.     . cholecalciferol 2000 units TABS Take 2,000 Units by mouth daily.    . Cobalamin Combinations (B-12) 1000-400 MCG SUBL Take 1 tablet by mouth daily.     [name] dicyclomine (BENTYL) 20 MG tablet TAKE 1 TABLET BY MOUTH 3 TIMES DAILY BEFORE MEALS.  AS NEEDED FOR ABDOMINAL PAIN 30 tablet 1  . DULoxetine (CYMBALTA) 60 MG capsule Take 1 capsule (60 mg total) by mouth daily. 90 capsule 3  . fentaNYL (DURAGESIC - DOSED MCG/HR) 50 MCG/HR Place 1 patch (50 mcg total) every other day onto the skin. 15 patch 0  . ferrous sulfate 325 (65 FE) MG tablet Take 1 tablet (325 mg total) by mouth  every other day.    . fluticasone (FLONASE) 50 MCG/ACT nasal spray Place 2 sprays into both nostrils daily as needed for allergies.     . folic acid (FOLVITE) 1 MG tablet Take 1 tablet (1 mg total) by mouth daily. 90 tablet 3  . furosemide (LASIX) 40 MG tablet Take 1 tablet (40 mg total) by mouth daily. 90 tablet 1  . irbesartan (AVAPRO) 75 MG tablet Take 1 tablet (75 mg total) by mouth daily. 90 tablet 3  . Lactulose 20 GM/30ML SOLN Take 30 mLs (20 g total) by mouth 3 (three) times daily. 2700 mL 5  . methocarbamol (ROBAXIN) 500 MG tablet TAKE 1 TABLET (500 MG TOTAL) BY MOUTH AT BEDTIME. 30 tablet 0  . metoprolol succinate (TOPROL-XL) 25 MG 24 hr tablet Take 0.5 tablets (12.5 mg total) by mouth daily. 30 tablet 1  . Misc Natural Products (TART CHERRY ADVANCED) CAPS Take 1 capsule by mouth 2 (two) times daily.    . nitroGLYCERIN (NITROSTAT) 0.4 MG SL tablet Place 1 tablet (0.4 mg total) under the tongue every 5 (five) minutes as needed for chest pain. 25 tablet 12  . nystatin cream (MYCOSTATIN) Apply 1 application topically 2 (two) times daily. 80 g 0  . omeprazole (PRILOSEC) 40 MG capsule TAKE 1 CAPSULE BY MOUTH EVERY DAY (Patient taking differently: Take 40 mg by mouth daily. ) 30 capsule 11  . ondansetron (ZOFRAN-ODT) 4 MG disintegrating tablet Take 1 tablet (4 mg total) by mouth every 8 (eight) hours as needed for nausea or vomiting. Day supply per insurance. 20 tablet 3  . oxyCODONE (ROXICODONE) 15 MG immediate release tablet Take 1 tablet (15 mg total) by mouth every 4 (four) hours as needed. (Patient taking differently: Take 90 mg by mouth daily. Take 6 tablets by mouth daily per patient) 90 tablet 0  . Red Yeast Rice 600 MG CAPS Take 2 capsules by mouth daily.    [name] spironolactone (ALDACTONE) 100 MG tablet Take 1 tablet (100 mg total) by mouth daily. 90 tablet 1  . vitamin B-12 (CYANOCOBALAMIN) 500 MCG tablet Take 1 tablet (500 mcg total) by mouth every Monday, Wednesday, and Friday.     No  current facility-administered medications for this visit.    Allergies as of 08/19/2019 - Review Complete 07/28/2019  Allergen Reaction Noted  . Statins Shortness Of Breath 01/22/2012  . Losartan Other (See Comments) 01/30/2013  . Allopurinol Nausea Only 12/18/2012  . Baclofen Nausea And Vomiting 06/23/2015  . Gabapentin Nausea And Vomiting 04/21/2015  . Penicillins Nausea And Vomiting 02/20/2018  . Tramadol Nausea Only 03/13/2017    ROS:  General: Negative for anorexia, weight loss, fever, chills, fatigue, weakness. ENT: Negative for hoarseness, difficulty swallowing , nasal congestion. CV: Negative for chest pain, angina, palpitations, dyspnea on exertion, peripheral edema.  Respiratory: Negative for dyspnea at rest, dyspnea on exertion, cough, sputum, wheezing.  GI: See history of present illness. GU:  Negative for dysuria, hematuria, urinary incontinence, urinary frequency, nocturnal urination.  Endo: Negative for unusual weight change.    Physical Examination:   There were no  vitals taken for this visit.  General: Well-nourished, well-developed in no acute distress.  Eyes: No icterus. Conjunctivae pink. Mouth: Shaking tooth left lower jaw.  Scheduled to have it extracted. Abdomen: Bowel sounds are normal, nontender, nondistended, no hepatosplenomegaly or masses, no abdominal bruits or hernia , no rebound or guarding.   Extremities: No lower extremity edema. No clubbing or deformities. Neuro: Alert and oriented x 3.  Grossly intact. Skin: Warm and dry, no jaundice.   Psych: Alert and cooperative, normal mood and affect.   Imaging Studies: No results found.  Assessment and Plan:   [name] is a 64 y.o. y/o male here to follow upfor liver cirrhosis. In the past been felt by his last GI in 2018 was probably NASH related, autoimmune work upshows a positive smooth muscle ab and elevated immunoglobulins.MELD9 Liver biopsy is inconclusive due to burnt out  liver.  He has gained 15 pounds of weight.  No tense ascites palpable.  Could be related to weight gain from fat probably not fluid.  No evidence of hepatic encephalopathy.   Plan  1.Continue Aldactone 100, Lasix 40, lactulose titrated to 1-2 soft bowel movements per day. 2.  Right upper quadrant ultrasound to screen for [facility] as well as ascites in 4 weeks time 3.  EGDto screen for varices in 2022 December  4.  Low-salt diet   Dr [name]  MD,MRCP [facility]) Follow up in 6 weeks

## 2019-08-20 ENCOUNTER — Telehealth: Payer: Self-pay

## 2019-08-20 ENCOUNTER — Telehealth: Payer: Self-pay | Admitting: *Deleted

## 2019-08-20 DIAGNOSIS — I252 Old myocardial infarction: Secondary | ICD-10-CM | POA: Diagnosis not present

## 2019-08-20 DIAGNOSIS — I255 Ischemic cardiomyopathy: Secondary | ICD-10-CM | POA: Diagnosis not present

## 2019-08-20 DIAGNOSIS — R188 Other ascites: Secondary | ICD-10-CM | POA: Diagnosis not present

## 2019-08-20 DIAGNOSIS — R296 Repeated falls: Secondary | ICD-10-CM | POA: Diagnosis not present

## 2019-08-20 DIAGNOSIS — K7469 Other cirrhosis of liver: Secondary | ICD-10-CM | POA: Diagnosis not present

## 2019-08-20 DIAGNOSIS — I2510 Atherosclerotic heart disease of native coronary artery without angina pectoris: Secondary | ICD-10-CM | POA: Diagnosis not present

## 2019-08-20 DIAGNOSIS — K7581 Nonalcoholic steatohepatitis (NASH): Secondary | ICD-10-CM | POA: Diagnosis not present

## 2019-08-20 DIAGNOSIS — G600 Hereditary motor and sensory neuropathy: Secondary | ICD-10-CM | POA: Diagnosis not present

## 2019-08-20 DIAGNOSIS — M1611 Unilateral primary osteoarthritis, right hip: Secondary | ICD-10-CM | POA: Diagnosis not present

## 2019-08-20 NOTE — Telephone Encounter (Signed)
Called pt to inform him of ultrasound appointment information.  Unable to contact, LVM to return call

## 2019-08-20 NOTE — Telephone Encounter (Signed)
No indication for SBE prophylaxis from a cardiac standpoint. Kirk Ruths

## 2019-08-20 NOTE — Telephone Encounter (Signed)
Unable to reach dentist, telephone tree for their office will not work.

## 2019-08-20 NOTE — Telephone Encounter (Signed)
[facility] calling to find out if patient needs premedicated with antibiotic for a dental extraction.  The patient is there currently and his wife said he does not but the patient thinks he does.  I reviewed chart/patient's history and medications. We have not prescribed him antibiotic in past.  Do not see cardiac indications for pre medicating with antibiotic. The dental office has been informed.   Will route to Dr. Stanford Breed and his nurse to inform.

## 2019-08-21 ENCOUNTER — Telehealth: Payer: Self-pay | Admitting: Family Medicine

## 2019-08-21 DIAGNOSIS — I252 Old myocardial infarction: Secondary | ICD-10-CM | POA: Diagnosis not present

## 2019-08-21 DIAGNOSIS — R188 Other ascites: Secondary | ICD-10-CM | POA: Diagnosis not present

## 2019-08-21 DIAGNOSIS — I255 Ischemic cardiomyopathy: Secondary | ICD-10-CM | POA: Diagnosis not present

## 2019-08-21 DIAGNOSIS — R296 Repeated falls: Secondary | ICD-10-CM | POA: Diagnosis not present

## 2019-08-21 DIAGNOSIS — M1611 Unilateral primary osteoarthritis, right hip: Secondary | ICD-10-CM | POA: Diagnosis not present

## 2019-08-21 DIAGNOSIS — G600 Hereditary motor and sensory neuropathy: Secondary | ICD-10-CM | POA: Diagnosis not present

## 2019-08-21 DIAGNOSIS — K7469 Other cirrhosis of liver: Secondary | ICD-10-CM | POA: Diagnosis not present

## 2019-08-21 DIAGNOSIS — K7581 Nonalcoholic steatohepatitis (NASH): Secondary | ICD-10-CM | POA: Diagnosis not present

## 2019-08-21 DIAGNOSIS — I2510 Atherosclerotic heart disease of native coronary artery without angina pectoris: Secondary | ICD-10-CM | POA: Diagnosis not present

## 2019-08-21 NOTE — Telephone Encounter (Signed)
Called pt's wife to inform her of pt's U/S appointment information.  Unable to contact, LVM to return call

## 2019-08-21 NOTE — Telephone Encounter (Signed)
Spoke with patient and wife.  Provided with [email or web address] website to sign up for his covid shot.

## 2019-08-21 NOTE — Telephone Encounter (Signed)
[name] called  She was at pt house.   Pt was very depressed His hip surgery he was supposed to get Monday was canceled because pt has 2 teeth pulled yesterday.  Pt told [name] he was really depressed he was looking forward to having surgery done. She wanted to know if dr g could call pt to help keep pt movated Surgery scheduled 10/06/19.  Pt and spouse were arguing also

## 2019-08-22 ENCOUNTER — Other Ambulatory Visit: Payer: Self-pay | Admitting: Family Medicine

## 2019-08-22 NOTE — Telephone Encounter (Signed)
Last OV 07/28/19 Last refill 07/29/19, #30 x 0 refills.   Please advise, thanks.

## 2019-08-25 DIAGNOSIS — K7581 Nonalcoholic steatohepatitis (NASH): Secondary | ICD-10-CM | POA: Diagnosis not present

## 2019-08-25 DIAGNOSIS — I252 Old myocardial infarction: Secondary | ICD-10-CM | POA: Diagnosis not present

## 2019-08-25 DIAGNOSIS — M1611 Unilateral primary osteoarthritis, right hip: Secondary | ICD-10-CM | POA: Diagnosis not present

## 2019-08-25 DIAGNOSIS — R296 Repeated falls: Secondary | ICD-10-CM | POA: Diagnosis not present

## 2019-08-25 DIAGNOSIS — I2510 Atherosclerotic heart disease of native coronary artery without angina pectoris: Secondary | ICD-10-CM | POA: Diagnosis not present

## 2019-08-25 DIAGNOSIS — R188 Other ascites: Secondary | ICD-10-CM | POA: Diagnosis not present

## 2019-08-25 DIAGNOSIS — G600 Hereditary motor and sensory neuropathy: Secondary | ICD-10-CM | POA: Diagnosis not present

## 2019-08-25 DIAGNOSIS — I255 Ischemic cardiomyopathy: Secondary | ICD-10-CM | POA: Diagnosis not present

## 2019-08-25 DIAGNOSIS — K7469 Other cirrhosis of liver: Secondary | ICD-10-CM | POA: Diagnosis not present

## 2019-08-28 DIAGNOSIS — R296 Repeated falls: Secondary | ICD-10-CM | POA: Diagnosis not present

## 2019-08-28 DIAGNOSIS — K7581 Nonalcoholic steatohepatitis (NASH): Secondary | ICD-10-CM | POA: Diagnosis not present

## 2019-08-28 DIAGNOSIS — K7469 Other cirrhosis of liver: Secondary | ICD-10-CM | POA: Diagnosis not present

## 2019-08-28 DIAGNOSIS — R188 Other ascites: Secondary | ICD-10-CM | POA: Diagnosis not present

## 2019-08-28 DIAGNOSIS — G600 Hereditary motor and sensory neuropathy: Secondary | ICD-10-CM | POA: Diagnosis not present

## 2019-08-28 DIAGNOSIS — I2510 Atherosclerotic heart disease of native coronary artery without angina pectoris: Secondary | ICD-10-CM | POA: Diagnosis not present

## 2019-08-28 DIAGNOSIS — I252 Old myocardial infarction: Secondary | ICD-10-CM | POA: Diagnosis not present

## 2019-08-28 DIAGNOSIS — M1611 Unilateral primary osteoarthritis, right hip: Secondary | ICD-10-CM | POA: Diagnosis not present

## 2019-08-28 DIAGNOSIS — I255 Ischemic cardiomyopathy: Secondary | ICD-10-CM | POA: Diagnosis not present

## 2019-08-29 ENCOUNTER — Ambulatory Visit: Payer: Medicare PPO | Attending: Internal Medicine

## 2019-08-29 DIAGNOSIS — Z23 Encounter for immunization: Secondary | ICD-10-CM

## 2019-08-29 NOTE — Progress Notes (Signed)
   Covid-19 Vaccination Clinic  Name:  [name]    MRN: [id number] DOB: 1956/05/30  08/29/2019  [name] was observed post Covid-19 immunization for 15 minutes   without incident. He was provided with Vaccine Information Sheet and instruction to access the V-Safe system.   [name] was instructed to call 911 with any severe reactions post vaccine: [name] Difficulty breathing  . Swelling of face and throat  . A fast heartbeat  . A bad rash all over body  . Dizziness and weakness   Immunizations Administered    Name Date Dose VIS Date Route   Pfizer COVID-19 Vaccine 08/29/2019 11:47 AM 0.3 mL 05/16/2019 Intramuscular   Manufacturer: [place]   Lot: [id number]   [place]: [address]-0

## 2019-09-03 ENCOUNTER — Ambulatory Visit (INDEPENDENT_AMBULATORY_CARE_PROVIDER_SITE_OTHER): Payer: Medicare PPO | Admitting: Internal Medicine

## 2019-09-03 ENCOUNTER — Telehealth: Payer: Self-pay | Admitting: Family Medicine

## 2019-09-03 ENCOUNTER — Other Ambulatory Visit: Payer: Self-pay

## 2019-09-03 ENCOUNTER — Encounter: Payer: Self-pay | Admitting: Internal Medicine

## 2019-09-03 DIAGNOSIS — K7469 Other cirrhosis of liver: Secondary | ICD-10-CM | POA: Diagnosis not present

## 2019-09-03 DIAGNOSIS — R112 Nausea with vomiting, unspecified: Secondary | ICD-10-CM | POA: Insufficient documentation

## 2019-09-03 DIAGNOSIS — K7581 Nonalcoholic steatohepatitis (NASH): Secondary | ICD-10-CM | POA: Diagnosis not present

## 2019-09-03 DIAGNOSIS — G600 Hereditary motor and sensory neuropathy: Secondary | ICD-10-CM | POA: Diagnosis not present

## 2019-09-03 DIAGNOSIS — I255 Ischemic cardiomyopathy: Secondary | ICD-10-CM | POA: Diagnosis not present

## 2019-09-03 DIAGNOSIS — M1611 Unilateral primary osteoarthritis, right hip: Secondary | ICD-10-CM | POA: Diagnosis not present

## 2019-09-03 DIAGNOSIS — I2510 Atherosclerotic heart disease of native coronary artery without angina pectoris: Secondary | ICD-10-CM | POA: Diagnosis not present

## 2019-09-03 DIAGNOSIS — R188 Other ascites: Secondary | ICD-10-CM | POA: Diagnosis not present

## 2019-09-03 DIAGNOSIS — R296 Repeated falls: Secondary | ICD-10-CM | POA: Diagnosis not present

## 2019-09-03 DIAGNOSIS — I252 Old myocardial infarction: Secondary | ICD-10-CM | POA: Diagnosis not present

## 2019-09-03 NOTE — Telephone Encounter (Signed)
Stacie called today requesting verbal order to extend PT for the patient  2 times a week for 2 weeks 1 time a week for 3 weeks   Phone- [phone] stacie stated to leave a message if she is unable to answer, VM is secure

## 2019-09-03 NOTE — Assessment & Plan Note (Addendum)
Wide differential diagnosis in this patient My first concern would be hepatic encephalopathy---he is on laculose but I do not see any ammonia levels in his chart. He does not seem encephalopathic though Next would be spontaneous bacterial peritonitis. He has some mild chronic tenderness but no fever and symptoms are not persistent. If he has ongoing issues, will need in person exam to exclude this. Does have ultrasound ordered Could be acid related but he is on PPI and not totally consistent with this. He could be reacting to all his medications---asked them to stop calcium, iron, red yeast rice ---in case they are causing symptoms (continue other more essential meds) Doesn't seem to have fluid overload by weight or appearance Doubt it is related to his narcotic Rx as meds have not changed  Again, if symptoms persist, will need in person exam

## 2019-09-03 NOTE — Progress Notes (Signed)
Subjective:    Patient ID: [name], male    DOB: [birth year], 64 y.o.   MRN: [id number]  HPI Virtual visit due to vomiting Identification done Reviewed billing and he gave consent Participants---patient and wife (CNA) in his home and I am in my office  He has had issues for years In bed due to bad hip---delayed due to multiple issues  Has been having nausea--then will be starving--and wife fixes food and he can't eat it Then will vomit if he tries to eat--but not all the time (like once every 2 days) Seen in hospital 5 months ago---had significant ascites drained Abdomen is mildly distended Some pain--close to belly button (usually comes and goes) Does have some tenderness in abdomen with pushing---no real change Does get some nausea after taking his meds  Current Outpatient Medications on File Prior to Visit  Medication Sig Dispense Refill  . albuterol (PROVENTIL HFA;VENTOLIN HFA) 108 (90 Base) MCG/ACT inhaler Inhale 2 puffs into the lungs every 6 (six) hours as needed for wheezing or shortness of breath. 1 Inhaler 0  . albuterol (PROVENTIL) (2.5 MG/3ML) 0.083% nebulizer solution USE 1 VIAL PER NEBULIZER EVERY 6 HRS AS NEEDED FOR WHEEZING (Patient taking differently: Take 2.5 mg by nebulization every 6 (six) hours as needed for shortness of breath. ) 75 mL 6  . aspirin 81 MG tablet Take 1 tablet (81 mg total) by mouth daily.    . B Complex-C (SUPER B COMPLEX PO) Take 1 tablet by mouth daily.    . Ca Phosphate-Cholecalciferol [phone] MG-UNIT TABS Take 1 tablet by mouth daily.     . cholecalciferol 2000 units TABS Take 2,000 Units by mouth daily.    . Cobalamin Combinations (B-12) 1000-400 MCG SUBL Take 1 tablet by mouth daily.     [name] dicyclomine (BENTYL) 20 MG tablet TAKE 1 TABLET BY MOUTH 3 TIMES DAILY BEFORE MEALS. AS NEEDED FOR ABDOMINAL PAIN 30 tablet 1  . DULoxetine (CYMBALTA) 60 MG capsule Take 1 capsule (60 mg total) by mouth daily. 90 capsule 3  . fentaNYL  (DURAGESIC - DOSED MCG/HR) 50 MCG/HR Place 1 patch (50 mcg total) every other day onto the skin. 15 patch 0  . ferrous sulfate 325 (65 FE) MG tablet Take 1 tablet (325 mg total) by mouth every other day.    . fluticasone (FLONASE) 50 MCG/ACT nasal spray Place 2 sprays into both nostrils daily as needed for allergies.     . folic acid (FOLVITE) 1 MG tablet Take 1 tablet (1 mg total) by mouth daily. 90 tablet 3  . furosemide (LASIX) 40 MG tablet Take 1 tablet (40 mg total) by mouth daily. 90 tablet 1  . irbesartan (AVAPRO) 75 MG tablet Take 1 tablet (75 mg total) by mouth daily. 90 tablet 3  . Lactulose 20 GM/30ML SOLN Take 30 mLs (20 g total) by mouth 3 (three) times daily. 2700 mL 5  . methocarbamol (ROBAXIN) 500 MG tablet TAKE 1 TABLET (500 MG TOTAL) BY MOUTH AT BEDTIME. 30 tablet 0  . metoprolol succinate (TOPROL-XL) 25 MG 24 hr tablet Take 0.5 tablets (12.5 mg total) by mouth daily. 30 tablet 1  . Misc Natural Products (TART CHERRY ADVANCED) CAPS Take 1 capsule by mouth 2 (two) times daily.    . nitroGLYCERIN (NITROSTAT) 0.4 MG SL tablet Place 1 tablet (0.4 mg total) under the tongue every 5 (five) minutes as needed for chest pain. 25 tablet 12  . nystatin cream (MYCOSTATIN) Apply 1 application  topically 2 (two) times daily. 80 g 0  . omeprazole (PRILOSEC) 40 MG capsule TAKE 1 CAPSULE BY MOUTH EVERY DAY 90 capsule 3  . ondansetron (ZOFRAN-ODT) 4 MG disintegrating tablet Take 1 tablet (4 mg total) by mouth every 8 (eight) hours as needed for nausea or vomiting. Day supply per insurance. 20 tablet 3  . oxyCODONE (ROXICODONE) 15 MG immediate release tablet Take 1 tablet (15 mg total) by mouth every 4 (four) hours as needed. (Patient taking differently: Take 90 mg by mouth daily. Take 6 tablets by mouth daily per patient) 90 tablet 0  . Red Yeast Rice 600 MG CAPS Take 2 capsules by mouth daily.    [name] spironolactone (ALDACTONE) 100 MG tablet Take 1 tablet (100 mg total) by mouth daily. 90 tablet 1  .  vitamin B-12 (CYANOCOBALAMIN) 500 MCG tablet Take 1 tablet (500 mcg total) by mouth every Monday, Wednesday, and Friday.     No current facility-administered medications on file prior to visit.    Allergies  Allergen Reactions  . Statins Shortness Of Breath    Cough, trouble breathing Cough, trouble breathing  . Losartan Other (See Comments)    Causes him to have pain  . Allopurinol Nausea Only  . Baclofen Nausea And Vomiting  . Gabapentin Nausea And Vomiting  . Penicillins Nausea And Vomiting    Did it involve swelling of the face/tongue/throat, SOB, or low BP? N/A Did it involve sudden or severe rash/hives, skin peeling, or any reaction on the inside of your mouth or nose? N/A Did you need to seek medical attention at a hospital or doctor's office? N/A When did it last happen? Child If all above answers are "NO", may proceed with cephalosporin use.  . Tramadol Nausea Only    Past Medical History:  Diagnosis Date  . AAA (abdominal aortic aneurysm) ([place]) 09/2012--  monitored by dr Trula Slade   stable 5.6cm CTA abdomen 2016  . Abnormal drug screen 07/09/2016   1/2/018 - positive oxycodone, fentanyl, inapprop positive MJ - mod risk  . Allergic rhinitis   . Ascites 03/2019  . B12 deficiency   . CAD (coronary artery disease) cardiologist-  dr Stanford Breed   x3 with stents last 2005, EF 40%, predominantly RCA by CT 2016  . Cataracts, bilateral   . Cervical spondylosis 05/2010   s/p surgery  . Charcot Marie Tooth muscular atrophy dx  [phone]   neurologist--  dr love--  type 2 per pt  . Chronic pain syndrome    established with [facility] ([place]) --> disagreement and transfered care to Dr Sanjuan Dame at [facility] pain clinic [facility], requests PCP write Rx but f/u with pain clinic Q6-12 months  . COPD (chronic obstructive pulmonary disease) ([place]) 10/2011   minimal by PFTs  . DDD (degenerative disc disease)   . Disturbances of sensation of smell and taste    improving  . Dyspnea on  exertion   . GERD (gastroesophageal reflux disease)   . Gout   . Headache   . Hepatitis    hepatitis B  . Hidradenitis    right groin  . Hidradenitis suppurativa dx 2011   goin and leg crease   followd by Lyndle Herrlich - daily bactrim, s/p intralesional steroid injection 10/2010  . Hip osteoarthritis    s/p intraarticular steroid shot (12/2012) ([place])  . History of hepatitis B 1983  . History of MI (myocardial infarction)    2000  &  2005  . History of pneumonia   .  History of viral meningitis 2000  . HLD (hyperlipidemia)   . HTN (hypertension)   . Ischemic cardiomyopathy    s/p inferior MI  --  current ef per myoview 39%  . Liver cirrhosis secondary to NASH ([place]) 01/2014   by CT scan, rec virtual colonoscopy by Dr Collene Mares 06/2014  . Lumbar herniated disc   . Myocardial infarction ([place])    x2  . Nocturia more than twice per night   . Obesity   . Spinal stenosis    released from [place].  established with preferred pain (07/2013)  . T2DM (type 2 diabetes mellitus) ([place])    ABIs WNL 2016  . Vitamin D deficiency     Past Surgical History:  Procedure Laterality Date  . ABDOMINAL AORTIC ENDOVASCULAR FENESTRATED STENT GRAFT N/A 11/30/2015   Procedure: ABDOMINAL AORTIC ENDOVASCULAR FENESTRATED STENT GRAFT;  Surgeon: Serafina Mitchell, MD;  Location: [place];  Service: Vascular;  Laterality: N/A;  . ANTERIOR CERVICAL DECOMP/DISCECTOMY FUSION  01-07-2010    C4 -- C7  . CARDIAC CATHETERIZATION  03-30-2005  dr Albertine Patricia   ef 40% w/ inferior akinesis/  LM and CFX angiographically normal/  pLAD 30%/   Widely patent stents in RCA and PDA widely patent  . CARDIOVASCULAR STRESS TEST  10-23-2012  dr Stanford Breed   No ischemia/  Moderate scar in the inferior wall, otherwise normal perfusion/  LV ef 39%,  LV wall motion: inferior/ inferolateral hypokinesis  . COLONOSCOPY  05/06/2007   normal, small int hemorrhoids rpt 5 yrs due to fmhx - rec against rpt colonoscopy by Dr Collene Mares  . CORONARY ANGIOPLASTY   2000  dr Stanford Breed   PCI to RCA and PDA  . CORONARY ANGIOPLASTY WITH STENT PLACEMENT  03-19-2005  dr Gwyndolyn Saxon downey   inferior STEMI--- DES x4 to RCA w/ balloon angioplasty and balloon angioplasty to jailed PDA ostium/  severe hypokinesis of midinferor wall, ef 50%/  dLM 20%,  mLAD 20%,  dCFX 60%  . ESOPHAGOGASTRODUODENOSCOPY  01/2017   dilated benign esophageal stenosis, portal hypertensive gastropathy Henrene Pastor)  . ESOPHAGOGASTRODUODENOSCOPY (EGD) WITH PROPOFOL N/A 02/20/2018   benign biopsy [name], MD)  . ESOPHAGOGASTRODUODENOSCOPY (EGD) WITH PROPOFOL N/A 05/12/2019   Procedure: ESOPHAGOGASTRODUODENOSCOPY (EGD) WITH PROPOFOL;  Surgeon: [name], MD;  Location: [facility] END[facility]COPY;  Service: Gastroenterology;  Laterality: N/A;  . HYDRADENITIS EXCISION Right 12/31/2014   Procedure: WIDE EXCISION HIDRADENITIS GROIN; Coralie Keens, MD  . IR PARACENTESIS  03/25/2019  . LUMBAR DISC SURGERY     L5-S1  . LUMBAR LAMINECTOMY  05-18-2010   L2--5   laminectomy/foraminotomy for stenosis Joya Salm)  . MYELOGRAM     L5-S1 and L1-2 spondylosis  . SACROILIAC JOINT INJECTION Bilateral 10/2013   [place]  . TONSILLECTOMY AND ADENOIDECTOMY  1972    Family History  Problem Relation Age of Onset  . Cancer Mother        colon  . Diabetes Mother   . Kidney disease Mother   . Aneurysm Mother        AAA  . Rheum arthritis Mother   . Charcot-Marie-Tooth disease Mother   . Heart disease Mother        before age 21  . Cancer Father        skin  . Heart attack Father   . Heart disease Father        before age 21  . Cancer Brother        skin  . Coronary artery disease Brother   .  Cancer Brother        small cell lung cancer  . Aneurysm Brother        AAA  . Rheum arthritis Sister   . Rheum arthritis Brother   . Prostate cancer Neg Hx   . Bladder Cancer Neg Hx   . Kidney cancer Neg Hx     Social History   Socioeconomic History  . Marital status: Married    Spouse name: [name]  . Number of  children: 0  . Years of education: College  . Highest education level: Not on file  Occupational History  . Occupation: Neurosurgeon implants, crown and bridge-now disability 2006    Employer: UNEMPLOYED  Tobacco Use  . Smoking status: Former Smoker    Packs/day: 1.00    Years: 57.00    Pack years: 57.00    Types: Cigarettes, E-cigarettes    Start date: 06/06/1967    Quit date: 07/21/2019    Years since quitting: 0.1  . Smokeless tobacco: Never Used  . Tobacco comment: stopped smoking a pipe in 2015  DOES SMOKE E CIG  Substance and Sexual Activity  . Alcohol use: No    Alcohol/week: 0.0 standard drinks  . Drug use: Yes    Types: Fentanyl, Hydrocodone  . Sexual activity: Not on file  Other Topics Concern  . Not on file  Social History Narrative   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 x 5 years   caffeine: 2 cups coffee, 2 cups soda   Occupation: Neurosurgeon implants, crowne and bridge, now disability 2006   Lives with wife, 1 dog, no children   Social Determinants of Health   Financial Resource Strain:   . Difficulty of Paying Living Expenses:   Food Insecurity:   . Worried About Charity fundraiser in the Last Year:   . Arboriculturist in the Last Year:   Transportation Needs:   . Film/video editor (Medical):   [name] Lack of Transportation (Non-Medical):   Physical Activity:   . Days of Exercise per Week:   . Minutes of Exercise per Session:   Stress:   . Feeling of Stress :   Social Connections:   . Frequency of Communication with Friends and Family:   . Frequency of Social Gatherings with Friends and Family:   . Attends Religious Services:   . Active Member of Clubs or Organizations:   . Attends Archivist Meetings:   [name] Marital Status:   Intimate Partner Violence:   . Fear of Current or Ex-Partner:   . Emotionally Abused:   [name] Physically Abused:   . Sexually Abused:    Review of Systems No fever Weight is about the same as last visit  with Dr Vicente Males Some cough ---but doesn't feel really sick Some slight chest pain---but went to cardiologist and no concerns (had pharmacologic stress test)    Objective:   Physical Exam  Constitutional: No distress.  Respiratory: Effort normal. No respiratory distress.  GI:  Mild distention Mild tenderness when he palpates just above the umbilicus, but not elsewhere  Musculoskeletal:        General: No edema.  Neurological:  Coherent Normal speech and alertness, etc           Assessment & Plan:

## 2019-09-04 NOTE — Telephone Encounter (Signed)
Agree with this. Thank you.  

## 2019-09-04 NOTE — Telephone Encounter (Signed)
Spoke with Roy her Dr. Darnell Level is giving verbal orders for services requested.

## 2019-09-04 NOTE — Telephone Encounter (Signed)
Attempted to return Stacy's call.  Vm box is full.  Need to inform her Dr. Darnell Level is giving verbal orders for services requested for pt.

## 2019-09-11 ENCOUNTER — Encounter: Payer: Self-pay | Admitting: *Deleted

## 2019-09-11 ENCOUNTER — Other Ambulatory Visit: Payer: Self-pay | Admitting: Family Medicine

## 2019-09-12 DIAGNOSIS — R296 Repeated falls: Secondary | ICD-10-CM | POA: Diagnosis not present

## 2019-09-12 DIAGNOSIS — R069 Unspecified abnormalities of breathing: Secondary | ICD-10-CM | POA: Diagnosis not present

## 2019-09-12 DIAGNOSIS — G600 Hereditary motor and sensory neuropathy: Secondary | ICD-10-CM | POA: Diagnosis not present

## 2019-09-12 DIAGNOSIS — M1611 Unilateral primary osteoarthritis, right hip: Secondary | ICD-10-CM | POA: Diagnosis not present

## 2019-09-16 ENCOUNTER — Ambulatory Visit: Admission: RE | Admit: 2019-09-16 | Payer: Medicare PPO | Source: Ambulatory Visit

## 2019-09-23 ENCOUNTER — Ambulatory Visit: Payer: Medicare HMO | Attending: Internal Medicine

## 2019-09-23 DIAGNOSIS — Z23 Encounter for immunization: Secondary | ICD-10-CM

## 2019-09-23 NOTE — Progress Notes (Signed)
   Covid-19 Vaccination Clinic  Name:  [name]    MRN: [id number] DOB: [birth year]  09/23/2019  [name] was observed post Covid-19 immunization for 15 minutes without incident. He was provided with Vaccine Information Sheet and instruction to access the V-Safe system.   [name] was instructed to call 911 with any severe reactions post vaccine: [name] Difficulty breathing  . Swelling of face and throat  . A fast heartbeat  . A bad rash all over body  . Dizziness and weakness   Immunizations Administered    Name Date Dose VIS Date Route   Pfizer COVID-19 Vaccine 09/23/2019 10:56 AM 0.3 mL 07/30/2018 Intramuscular   Manufacturer: [place]   Lot: [id number]   [place]: [address]-8      Covid-19 Vaccination Clinic  Name:  [name]    MRN: [id number] DOB: 1955/08/17  09/23/2019  [name] was observed post Covid-19 immunization for 15 minutes without incident. He was provided with Vaccine Information Sheet and instruction to access the V-Safe system.   [name] was instructed to call 911 with any severe reactions post vaccine: [name] Difficulty breathing  . Swelling of face and throat  . A fast heartbeat  . A bad rash all over body  . Dizziness and weakness   Immunizations Administered    Name Date Dose VIS Date Route   Pfizer COVID-19 Vaccine 09/23/2019 10:56 AM 0.3 mL 07/30/2018 Intramuscular   Manufacturer: [place]   Lot: [id number]   [place]: [address]-7

## 2019-09-30 ENCOUNTER — Telehealth: Payer: Self-pay | Admitting: Family Medicine

## 2019-09-30 NOTE — Telephone Encounter (Signed)
Agree with verbal orders for above.  Will touch base with patient.

## 2019-09-30 NOTE — Telephone Encounter (Signed)
Spoke with [place] her Dr. Darnell Level is giving verbal orders for services requested.

## 2019-09-30 NOTE — Telephone Encounter (Signed)
Stacy with [facility] calling  Martin Majestic out to patient house, Wife has left patient d/t a fight/disagreement.  Pt has been alone for a few days now.  Marzetta Board confirmed the patient has not eaten nor taken his medications in a few days. She was able to get his "necessary meds" in him today. She is requesting that Dr Darnell Level reach out to the patient. Marzetta Board is unaware if there is any other family support to help with daily living - food, meds, cleaning and bathing. She is going to continue PT with the patient. Pt is stable -- she was able to get him some water and a little food. She made his home more accessible so that he can get around in his wheelchair to care for himself.   -Needs a Home Health Nurse Aid -Continue PT services -Social worker for resources - meals  Patient is very depressed.   Marzetta Board states that the patient is working toward a hip replacement June with [facility].

## 2019-10-01 NOTE — Telephone Encounter (Signed)
Patient returned your call.

## 2019-10-01 NOTE — Telephone Encounter (Signed)
Called and spoke with wife - # in chart was her cell phone. She gave me correct #.  [phone] is pt's #.  Called and left message for Ron.  Will try again later.

## 2019-10-01 NOTE — Telephone Encounter (Signed)
Patient returned Dr. Synthia Innocent call.  Patient can be reached at [phone].

## 2019-10-02 NOTE — Telephone Encounter (Signed)
Per [email or web address]. They are adding on our request (aide).

## 2019-10-02 NOTE — Telephone Encounter (Addendum)
Spoke w/ patient. Can we call [place] to add verbal order for nurse aide to help transitions and shower?

## 2019-10-02 NOTE — Telephone Encounter (Signed)
Called [facility] and spoke with Donella Stade. She took a message about adding Verbal orders to add Nurse Aide fort transitions and bathing. She will look into and call me back.

## 2019-10-03 ENCOUNTER — Telehealth: Payer: Self-pay

## 2019-10-03 NOTE — Telephone Encounter (Signed)
Mrs Pollack, called to give update on patient and her concerns. She has separated from patient since Monday 09/29/19. She can not do it anymore, patient has worn her down, he expects her to wait on him hand and foot, and take care of everything for him. He does not want to do anything for himself. He even told Mrs Mimbs that Dr Darnell Level told him that she does need to wait on him hand and foot. Patient is the one that told his wife that the marriage is over and he did not love her anymore. She is tired of everything, patient is miserable and not happy with himself so he is trying to make her miserable with him. She loves him but he is not the person he use to be and she needs to take care of herself. She is still concerned for him been alone because he will not do anythying for him, he will just lay in bed all day. When P.T comes out to the home he will do the exercises while they are there but after they leave he just goes back to the bed and lays around. There is a dent in the bed from where he lays so much. Patient will need someone to bath him, to make his dinner, basically everything for him. He is not motivated to do anything for himself and Mrs Yawn wonders if he needs to be in a rehab like place to help him.   He is not taking any of his medications, and has not been at least since Monday 09/29/19 when she left. She left his medication and bottles on a counter for him, but yesterday she ended up bringining him food because he blew up her phone all day asking her to come and she saw the bottles and pills still laying in the same spot. Mrs Merryfield states he is a man that wants to control everything. He also lies and she states people should watch out for this, patient will tell you what you want to hear. Now that patient's wife left him, he is telling everyone lies about her and she is just trying to tell the truth about what really is going on.  Mrs Nasworthy said if Dr Darnell Level wanted to speak with her to  please call her before speaking with patient (ok per DPR on file to speak with wife). She goes to work at 3 pm and can be reached before then. CB is [phone]

## 2019-10-08 DIAGNOSIS — M542 Cervicalgia: Secondary | ICD-10-CM | POA: Diagnosis not present

## 2019-10-08 DIAGNOSIS — G894 Chronic pain syndrome: Secondary | ICD-10-CM | POA: Diagnosis not present

## 2019-10-08 DIAGNOSIS — M545 Low back pain: Secondary | ICD-10-CM | POA: Diagnosis not present

## 2019-10-08 DIAGNOSIS — Z79891 Long term (current) use of opiate analgesic: Secondary | ICD-10-CM | POA: Diagnosis not present

## 2019-10-10 ENCOUNTER — Telehealth: Payer: Self-pay

## 2019-10-10 NOTE — Telephone Encounter (Signed)
Agree with this.  plz fax updated med list over.  Now that wife is out of the house, agree with SN for med management assistance, and would like to have social Development worker, community come out to the house to evaluate patient's ongoing safey living alone at home.

## 2019-10-10 NOTE — Telephone Encounter (Signed)
See recent phone notes -seeing if we can add on SN, [place], social worker now that patient lives alone.

## 2019-10-10 NOTE — Telephone Encounter (Signed)
Amy nurse with [facility] left v/m requesting verbal orders for [place] 1 x a wk thru July which is certification period for medication mgt. Amy also request updated medication list faxed to [phone]. Amy may use [facility] so pts meds will be prepackaged.

## 2019-10-10 NOTE — Telephone Encounter (Signed)
I contacted Amy and notified her of these orders and she verbalized understanding.  Updated medication list has been faxed to number provided.

## 2019-10-12 DIAGNOSIS — G600 Hereditary motor and sensory neuropathy: Secondary | ICD-10-CM | POA: Diagnosis not present

## 2019-10-12 DIAGNOSIS — M1611 Unilateral primary osteoarthritis, right hip: Secondary | ICD-10-CM | POA: Diagnosis not present

## 2019-10-12 DIAGNOSIS — R069 Unspecified abnormalities of breathing: Secondary | ICD-10-CM | POA: Diagnosis not present

## 2019-10-12 DIAGNOSIS — R296 Repeated falls: Secondary | ICD-10-CM | POA: Diagnosis not present

## 2019-10-13 ENCOUNTER — Telehealth: Payer: Self-pay | Admitting: *Deleted

## 2019-10-13 DIAGNOSIS — Z122 Encounter for screening for malignant neoplasm of respiratory organs: Secondary | ICD-10-CM

## 2019-10-13 DIAGNOSIS — Z87891 Personal history of nicotine dependence: Secondary | ICD-10-CM

## 2019-10-13 NOTE — Telephone Encounter (Signed)
Patient has been notified that annual lung cancer screening low dose CT scan is due currently or will be in near future. Confirmed that patient is within the age range of 55-77, and asymptomatic, (no signs or symptoms of lung cancer). Patient denies illness that would prevent curative treatment for lung cancer if found. Verified smoking history, (former, quit yesterday, 59.25 pack year). The shared decision making visit was done 12/28/17. Patient is agreeable for CT scan being scheduled.

## 2019-10-16 DIAGNOSIS — K7469 Other cirrhosis of liver: Secondary | ICD-10-CM | POA: Diagnosis not present

## 2019-10-16 DIAGNOSIS — L732 Hidradenitis suppurativa: Secondary | ICD-10-CM | POA: Diagnosis not present

## 2019-10-16 DIAGNOSIS — Z87891 Personal history of nicotine dependence: Secondary | ICD-10-CM | POA: Diagnosis not present

## 2019-10-16 DIAGNOSIS — D696 Thrombocytopenia, unspecified: Secondary | ICD-10-CM | POA: Diagnosis not present

## 2019-10-16 DIAGNOSIS — Z6841 Body Mass Index (BMI) 40.0 and over, adult: Secondary | ICD-10-CM | POA: Diagnosis not present

## 2019-10-16 DIAGNOSIS — Z8679 Personal history of other diseases of the circulatory system: Secondary | ICD-10-CM | POA: Diagnosis not present

## 2019-10-16 DIAGNOSIS — G4733 Obstructive sleep apnea (adult) (pediatric): Secondary | ICD-10-CM | POA: Diagnosis not present

## 2019-10-16 DIAGNOSIS — Z01810 Encounter for preprocedural cardiovascular examination: Secondary | ICD-10-CM | POA: Diagnosis not present

## 2019-10-16 DIAGNOSIS — M1611 Unilateral primary osteoarthritis, right hip: Secondary | ICD-10-CM | POA: Diagnosis not present

## 2019-10-16 DIAGNOSIS — G600 Hereditary motor and sensory neuropathy: Secondary | ICD-10-CM | POA: Diagnosis not present

## 2019-10-20 ENCOUNTER — Ambulatory Visit: Admission: RE | Admit: 2019-10-20 | Payer: Medicare HMO | Source: Ambulatory Visit

## 2019-10-23 ENCOUNTER — Ambulatory Visit: Payer: Medicare HMO | Attending: Oncology

## 2019-10-24 DIAGNOSIS — D696 Thrombocytopenia, unspecified: Secondary | ICD-10-CM

## 2019-10-24 DIAGNOSIS — Z6838 Body mass index (BMI) 38.0-38.9, adult: Secondary | ICD-10-CM

## 2019-10-24 DIAGNOSIS — I2510 Atherosclerotic heart disease of native coronary artery without angina pectoris: Secondary | ICD-10-CM | POA: Diagnosis not present

## 2019-10-24 DIAGNOSIS — D539 Nutritional anemia, unspecified: Secondary | ICD-10-CM

## 2019-10-24 DIAGNOSIS — G894 Chronic pain syndrome: Secondary | ICD-10-CM

## 2019-10-24 DIAGNOSIS — E538 Deficiency of other specified B group vitamins: Secondary | ICD-10-CM

## 2019-10-24 DIAGNOSIS — I252 Old myocardial infarction: Secondary | ICD-10-CM | POA: Diagnosis not present

## 2019-10-24 DIAGNOSIS — E11610 Type 2 diabetes mellitus with diabetic neuropathic arthropathy: Secondary | ICD-10-CM | POA: Diagnosis not present

## 2019-10-24 DIAGNOSIS — R188 Other ascites: Secondary | ICD-10-CM | POA: Diagnosis not present

## 2019-10-24 DIAGNOSIS — M1611 Unilateral primary osteoarthritis, right hip: Secondary | ICD-10-CM | POA: Diagnosis not present

## 2019-10-24 DIAGNOSIS — K7581 Nonalcoholic steatohepatitis (NASH): Secondary | ICD-10-CM | POA: Diagnosis not present

## 2019-10-24 DIAGNOSIS — G600 Hereditary motor and sensory neuropathy: Secondary | ICD-10-CM | POA: Diagnosis not present

## 2019-10-24 DIAGNOSIS — J449 Chronic obstructive pulmonary disease, unspecified: Secondary | ICD-10-CM

## 2019-10-24 DIAGNOSIS — I110 Hypertensive heart disease with heart failure: Secondary | ICD-10-CM

## 2019-10-24 DIAGNOSIS — K7469 Other cirrhosis of liver: Secondary | ICD-10-CM | POA: Diagnosis not present

## 2019-10-24 DIAGNOSIS — E1136 Type 2 diabetes mellitus with diabetic cataract: Secondary | ICD-10-CM

## 2019-10-24 DIAGNOSIS — I5030 Unspecified diastolic (congestive) heart failure: Secondary | ICD-10-CM

## 2019-10-24 DIAGNOSIS — I714 Abdominal aortic aneurysm, without rupture: Secondary | ICD-10-CM

## 2019-10-24 DIAGNOSIS — E559 Vitamin D deficiency, unspecified: Secondary | ICD-10-CM

## 2019-10-24 DIAGNOSIS — R296 Repeated falls: Secondary | ICD-10-CM | POA: Diagnosis not present

## 2019-10-24 DIAGNOSIS — K219 Gastro-esophageal reflux disease without esophagitis: Secondary | ICD-10-CM

## 2019-10-24 DIAGNOSIS — M47892 Other spondylosis, cervical region: Secondary | ICD-10-CM

## 2019-10-24 DIAGNOSIS — G7100 Muscular dystrophy, unspecified: Secondary | ICD-10-CM

## 2019-10-24 DIAGNOSIS — M109 Gout, unspecified: Secondary | ICD-10-CM

## 2019-11-03 ENCOUNTER — Other Ambulatory Visit: Payer: Self-pay | Admitting: Family Medicine

## 2019-11-04 NOTE — Telephone Encounter (Signed)
ERx 

## 2019-11-04 NOTE — Telephone Encounter (Signed)
Last office visit 09/03/2019 with Dr. Silvio Pate for Non-intractable vomiting.  Last refilled 08/25/2019 for #30 with no refills.  No future appointments with PCP.

## 2019-11-05 ENCOUNTER — Ambulatory Visit: Payer: Medicare HMO | Attending: Oncology

## 2019-11-10 DIAGNOSIS — D696 Thrombocytopenia, unspecified: Secondary | ICD-10-CM | POA: Diagnosis not present

## 2019-11-10 DIAGNOSIS — E119 Type 2 diabetes mellitus without complications: Secondary | ICD-10-CM | POA: Diagnosis not present

## 2019-11-10 DIAGNOSIS — Z538 Procedure and treatment not carried out for other reasons: Secondary | ICD-10-CM | POA: Diagnosis not present

## 2019-11-10 DIAGNOSIS — M1611 Unilateral primary osteoarthritis, right hip: Secondary | ICD-10-CM | POA: Diagnosis not present

## 2019-11-12 DIAGNOSIS — R296 Repeated falls: Secondary | ICD-10-CM | POA: Diagnosis not present

## 2019-11-12 DIAGNOSIS — R069 Unspecified abnormalities of breathing: Secondary | ICD-10-CM | POA: Diagnosis not present

## 2019-11-12 DIAGNOSIS — G600 Hereditary motor and sensory neuropathy: Secondary | ICD-10-CM | POA: Diagnosis not present

## 2019-11-12 DIAGNOSIS — M1611 Unilateral primary osteoarthritis, right hip: Secondary | ICD-10-CM | POA: Diagnosis not present

## 2019-11-13 ENCOUNTER — Telehealth: Payer: Self-pay

## 2019-11-13 NOTE — Telephone Encounter (Signed)
Increase lasix to 79m BID for 3 days then return to 438mdaily. Call us[place] week with weight and abd girth readings.

## 2019-11-13 NOTE — Telephone Encounter (Signed)
Amy nurse with [facility] left v/m; pt usually weighs 260 lbs. Today pts weight was 269 lbs. Pt has not bee taking lactulose but pt will restart that today. Amy wants to know for few days should pt increase fluid pill. abd is distended. Usual measure for abd is 131 girth but today 135-136 girth. Pt did not have surgery as planned due to low platelets and pt is going to hematologist. AMy request cb with any med changes to get fluid off.

## 2019-11-13 NOTE — Telephone Encounter (Signed)
Spoke to Amy. She will let pt know. Will update [place] next week.

## 2019-12-01 DIAGNOSIS — Z8719 Personal history of other diseases of the digestive system: Secondary | ICD-10-CM | POA: Diagnosis not present

## 2019-12-01 DIAGNOSIS — G600 Hereditary motor and sensory neuropathy: Secondary | ICD-10-CM | POA: Diagnosis not present

## 2019-12-01 DIAGNOSIS — Z862 Personal history of diseases of the blood and blood-forming organs and certain disorders involving the immune mechanism: Secondary | ICD-10-CM | POA: Diagnosis not present

## 2019-12-02 ENCOUNTER — Telehealth: Payer: Self-pay | Admitting: Gastroenterology

## 2019-12-02 NOTE — Telephone Encounter (Signed)
Per [place] patient's wife called & left message for [place] to call & schedule an appointment with DR Vicente Males on her v/m. I have called & l/m for him to return call to schedule an appointment.

## 2019-12-04 ENCOUNTER — Telehealth: Payer: Self-pay | Admitting: Physician Assistant

## 2019-12-04 NOTE — Telephone Encounter (Signed)
Pt wife called office asking to make her husband an appt, when asked what does he need to be seen for wife stated pt having problems urinating, when I started asking more specific questions the wife passed the phone to the pt, pt states he is having problems urinating, has to sit for a while, then stand and eventually able to urinate some, pt c/o painful testicles, back pain and vomiting. I spoke to Sam about the pt and symptoms who advised pt should come on in to the office, when I shared that with the pt, pt stated he can't make it today, he has no transportation his wife works 2nd shift and his buddy is at [place] donating a kidney, he could come Monday explained that the office is closed Monday for the July 4th holiday. Pt stated he could come Tuesday. I then explained to the pt that IF he was in "retention" that is considered a urological emergency and should not wait til next week, risks of deferring care include risks of infection, bladder rupture, kidney damage and kidney failure. Sam highly advised pt to find a way to the office today either via taxi, uber, in which the pt then said he was cripple and unable to dress himself then I expressed he could go to the ER by ambulance. Pt then stated he'll be fine and would not hold [place] at fault if anything should happen prior to Tuesday. Spoke to Sam again, who reiterates that the pt should not wait to be seen, reiterated that to the pt and asked how long has he been having these symptoms, pt said for about 7-8 months. Pt expressed understanding to the risks and insisted he'd be okay waiting til Tuesday.

## 2019-12-09 ENCOUNTER — Other Ambulatory Visit: Payer: Self-pay

## 2019-12-09 ENCOUNTER — Ambulatory Visit: Payer: Self-pay | Admitting: Physician Assistant

## 2019-12-09 ENCOUNTER — Ambulatory Visit: Payer: Medicare HMO | Admitting: Gastroenterology

## 2019-12-09 VITALS — BP 120/88 | HR 97 | Temp 98.9°F | Ht 69.0 in | Wt 263.8 lb

## 2019-12-09 DIAGNOSIS — R188 Other ascites: Secondary | ICD-10-CM

## 2019-12-09 DIAGNOSIS — R110 Nausea: Secondary | ICD-10-CM | POA: Diagnosis not present

## 2019-12-09 DIAGNOSIS — D696 Thrombocytopenia, unspecified: Secondary | ICD-10-CM

## 2019-12-09 DIAGNOSIS — K7460 Unspecified cirrhosis of liver: Secondary | ICD-10-CM

## 2019-12-09 MED ORDER — ONDANSETRON HCL 4 MG PO TABS: 4.0000 mg | ORAL_TABLET | Freq: Three times a day (TID) | ORAL | 1 refills | Status: DC | PRN

## 2019-12-09 NOTE — Progress Notes (Signed)
Miguel Bellows MD, MRCP(U.K) [address]  [name]  [place], [place] [address]  Main: [phone]  Fax: [phone]   Primary Care Physician: Miguel Bush, MD  Primary Gastroenterologist:  Dr. Jonathon Ware   Nausea follow-up  HPI: [name] is a 64 y.o. male   Summary of history :  He was initially referred and seen on 02/13/18 for cirrhosis of the liver.He has been seen by [facility] in the past.. His Cirrhosis had been attriuted to NASH. Used cocaine in his 20's, none recently. Weighed 370 lbs at his heaviest. Was always " big", he is not diabetic.   EGD 01/2017 -PHG, mild stricture was dilated.  RUQ USG 11/2017 : Small ascites, cirrhosis, Chronic dilation of CBD, mild dilation of PD, splenomegaly   CT angio of the abdomen 12/2017:Post endovascular repair of suprarenal abdominal aortic aneurysm without evidence of complication.Suspected hemodynamically significant stenosis involving the origin of the common hepatic artery, similar to the 12/2015 examination.Potential hemodynamically significant narrowing involving theproximal aspect of the right SFA.Findings of hepatic cirrhosis and portal venous hypertension with several varices about the gastroesophageal junction, splenomegaly and trace amount of intra-abdominal ascites. No discrete hyperenhancing hepatic lesions  02/20/18 ; EGD -normal -no eosinophils in esophagus.  02/13/18 : Cr 1.03,Hep A ab positive , albumin 3.0 ,alk phos 149 , Hbsab/ag,HCV ab , ANA,negative .Ceruloplasmin ,alpha 1 AT,Iron studies - normal   Smooth muscle antibody positive , elevated immunoglobulins G, AMA,LKM ab negative 03/19/18 : liver biopsy : steatosis , focal plasma cells, mild chronic cholestasis. Unmable to get a diagnosis due to burned out liver disease. 04/25/2019 : INR : 1.3 , MELD 10,Cr 0.94 05/15/2019: MRI liver cirrhotic liver, dilated CBD, no obstruction . Splenomegaly and trace ascites 05/12/2019: EGD:  Normal Hospital admission in 03/26/2019 for ascites, underwent paracentesis.    Interval history3/16/2021-12/09/2019 At his last visit for ultrasound was ordered to screen for ascites as well as [facility] did not obtain it. No new labs  He was planned to have an orthopedic surgery and it was canceled last minute due to low platelet count.  Unfortunately we were not discussed with to help improve his platelet count prior to surgery.  Main complaint today is nausea.  Does not take his lactulose regularly.  Feels he has gained weight. Is been complaining of nausea with occasional vomiting.  Taking Prilosec daily. Current Outpatient Medications  Medication Sig Dispense Refill  . albuterol (PROVENTIL HFA;VENTOLIN HFA) 108 (90 Base) MCG/ACT inhaler Inhale 2 puffs into the lungs every 6 (six) hours as needed for wheezing or shortness of breath. 1 Inhaler 0  . albuterol (PROVENTIL) (2.5 MG/3ML) 0.083% nebulizer solution USE 1 VIAL PER NEBULIZER EVERY 6 HRS AS NEEDED FOR WHEEZING (Patient taking differently: Take 2.5 mg by nebulization every 6 (six) hours as needed for shortness of breath. ) 75 mL 6  . aspirin 81 MG tablet Take 1 tablet (81 mg total) by mouth daily.    . B Complex-C (SUPER B COMPLEX PO) Take 1 tablet by mouth daily.    . Ca Phosphate-Cholecalciferol [phone] MG-UNIT TABS Take 1 tablet by mouth daily.     . cholecalciferol 2000 units TABS Take 2,000 Units by mouth daily.    . Cobalamin Combinations (B-12) 1000-400 MCG SUBL Take 1 tablet by mouth daily.     [name] dicyclomine (BENTYL) 20 MG tablet TAKE 1 TABLET BY MOUTH 3 TIMES DAILY BEFORE MEALS. AS NEEDED FOR ABDOMINAL PAIN 30 tablet 1  . DULoxetine (CYMBALTA) 60  MG capsule Take 1 capsule (60 mg total) by mouth daily. 90 capsule 3  . fentaNYL (DURAGESIC - DOSED MCG/HR) 50 MCG/HR Place 1 patch (50 mcg total) every other day onto the skin. 15 patch 0  . ferrous sulfate 325 (65 FE) MG tablet Take 1 tablet (325 mg total) by mouth every other  day.    . fluticasone (FLONASE) 50 MCG/ACT nasal spray Place 2 sprays into both nostrils daily as needed for allergies.     . folic acid (FOLVITE) 1 MG tablet Take 1 tablet (1 mg total) by mouth daily. 90 tablet 3  . furosemide (LASIX) 40 MG tablet TAKE 1 TABLET BY MOUTH EVERY DAY 90 tablet 1  . irbesartan (AVAPRO) 75 MG tablet Take 1 tablet (75 mg total) by mouth daily. 90 tablet 3  . Lactulose 20 GM/30ML SOLN Take 30 mLs (20 g total) by mouth 3 (three) times daily. 2700 mL 5  . methocarbamol (ROBAXIN) 500 MG tablet TAKE 1 TABLET (500 MG TOTAL) BY MOUTH AT BEDTIME. 30 tablet 0  . metoprolol succinate (TOPROL-XL) 25 MG 24 hr tablet Take 0.5 tablets (12.5 mg total) by mouth daily. 30 tablet 1  . Misc Natural Products (TART CHERRY ADVANCED) CAPS Take 1 capsule by mouth 2 (two) times daily.    . nitroGLYCERIN (NITROSTAT) 0.4 MG SL tablet Place 1 tablet (0.4 mg total) under the tongue every 5 (five) minutes as needed for chest pain. 25 tablet 12  . nystatin cream (MYCOSTATIN) Apply 1 application topically 2 (two) times daily. 80 g 0  . omeprazole (PRILOSEC) 40 MG capsule TAKE 1 CAPSULE BY MOUTH EVERY DAY 90 capsule 3  . ondansetron (ZOFRAN-ODT) 4 MG disintegrating tablet Take 1 tablet (4 mg total) by mouth every 8 (eight) hours as needed for nausea or vomiting. Day supply per insurance. 20 tablet 3  . oxyCODONE (ROXICODONE) 15 MG immediate release tablet Take 1 tablet (15 mg total) by mouth every 4 (four) hours as needed. (Patient taking differently: Take 90 mg by mouth daily. Take 6 tablets by mouth daily per patient) 90 tablet 0  . Red Yeast Rice 600 MG CAPS Take 2 capsules by mouth daily.    [name] spironolactone (ALDACTONE) 100 MG tablet TAKE 1 TABLET BY MOUTH EVERY DAY 90 tablet 1  . vitamin B-12 (CYANOCOBALAMIN) 500 MCG tablet Take 1 tablet (500 mcg total) by mouth every Monday, Wednesday, and Friday.     No current facility-administered medications for this visit.    Allergies as of 12/09/2019 -  Review Complete 09/03/2019  Allergen Reaction Noted  . Statins Shortness Of Breath 01/22/2012  . Losartan Other (See Comments) 01/30/2013  . Allopurinol Nausea Only 12/18/2012  . Baclofen Nausea And Vomiting 06/23/2015  . Gabapentin Nausea And Vomiting 04/21/2015  . Penicillins Nausea And Vomiting 02/20/2018  . Tramadol Nausea Only 03/13/2017    ROS:  General: Negative for anorexia, weight loss, fever, chills, fatigue, weakness. ENT: Negative for hoarseness, difficulty swallowing , nasal congestion. CV: Negative for chest pain, angina, palpitations, dyspnea on exertion, peripheral edema.  Respiratory: Negative for dyspnea at rest, dyspnea on exertion, cough, sputum, wheezing.  GI: See history of present illness. GU:  Negative for dysuria, hematuria, urinary incontinence, urinary frequency, nocturnal urination.  Endo: Negative for unusual weight change.    Physical Examination:   There were no vitals taken for this visit.  General: Well-nourished, well-developed in no acute distress.  Eyes: No icterus. Conjunctivae pink. Mouth: Oropharyngeal mucosa moist and pink , no  lesions erythema or exudate. Neuro: Alert and oriented x 3.  Grossly intact. Skin: Warm and dry, no jaundice.   Psych: Alert and cooperative, normal mood and affect.   Imaging Studies: No results found.  Assessment and Plan:   [name] is a 64 y.o. y/o male here to follow upfor liver cirrhosis. In the past been felt by his last GI in 2018 was probably NASH related, autoimmune work upshows a positive smooth muscle ab and elevated immunoglobulins.MELD9 Liver biopsy is inconclusive due to burnt out liver.  He has gained 15 pounds of weight.  No tense ascites palpable.  Could be related to weight gain from fat probably not fluid.  No evidence of hepatic encephalopathy.   Plan  1.Continue Aldactone 100, Lasix 40, lactulose titrated to 1-2 soft bowel movements per day. 2.  Right upper quadrant  ultrasound to screen for [facility] as well as ascites  3.  EGDto screen for varices in 2022 December  4.  Low-salt diet 5.  CBC, CMP, INR to calculate meld score 6.  We will obtain authorization for a drug to boost the platelet count in cirrhotic patients to facilitate with orthopedic surgery.  We will try and coordinate with his orthopedic surgeon for a date based on insurance authorization of the drug for his surgery. Avatrombopag: .Platelet count 40,000 to <50,000/mm3: 40 mg once daily for 5 consecutive day.In a phase III trial, 227 individuals with liver disease who had platelet counts <50,000/microL and required an invasive procedure were randomly assigned to receive avatrombopag or placebo daily for five days and assessed for the need for platelet transfusions up to seven days after procedure . Avatrombopag increased the platelet count and reduced the need for transfusions (transfusions given in 12 to 34 percent of the eltrombopag group versus 62 to 77 percent of the placebo group). There was no difference in bleeding events, and no thrombotic events after close assessment of the portal vein system by ultrasound . 7.  I will speak to the patient in 2 weeks after spoken to the orthopedic surgeon to update him. 8.  Zofran for nausea continue Prilosec.  Dr Miguel Bellows  MD,MRCP [facility]) Follow up in 2 to 3 weeks telephone visit

## 2019-12-10 ENCOUNTER — Ambulatory Visit: Payer: Medicare HMO | Admitting: Urology

## 2019-12-10 ENCOUNTER — Encounter: Payer: Self-pay | Admitting: Urology

## 2019-12-10 VITALS — BP 124/72 | HR 84 | Ht 69.0 in | Wt 262.0 lb

## 2019-12-10 DIAGNOSIS — N5082 Scrotal pain: Secondary | ICD-10-CM

## 2019-12-10 DIAGNOSIS — R399 Unspecified symptoms and signs involving the genitourinary system: Secondary | ICD-10-CM | POA: Insufficient documentation

## 2019-12-10 LAB — CBC WITH DIFFERENTIAL/PLATELET
Basophils Absolute: 0 10*3/uL (ref 0.0–0.2)
Basos: 1 %
EOS (ABSOLUTE): 0.1 10*3/uL (ref 0.0–0.4)
Eos: 2 %
Hematocrit: 34.7 % — ABNORMAL LOW (ref 37.5–51.0)
Hemoglobin: 11.9 g/dL — ABNORMAL LOW (ref 13.0–17.7)
Immature Grans (Abs): 0 10*3/uL (ref 0.0–0.1)
Immature Granulocytes: 0 %
Lymphocytes Absolute: 0.8 10*3/uL (ref 0.7–3.1)
Lymphs: 21 %
MCH: 33.9 pg — ABNORMAL HIGH (ref 26.6–33.0)
MCHC: 34.3 g/dL (ref 31.5–35.7)
MCV: 99 fL — ABNORMAL HIGH (ref 79–97)
Monocytes Absolute: 0.3 10*3/uL (ref 0.1–0.9)
Monocytes: 8 %
Neutrophils Absolute: 2.5 10*3/uL (ref 1.4–7.0)
Neutrophils: 68 %
Platelets: 48 10*3/uL — CL (ref 150–450)
RBC: 3.51 x10E6/uL — ABNORMAL LOW (ref 4.14–5.80)
RDW: 13.2 % (ref 11.6–15.4)
WBC: 3.7 10*3/uL (ref 3.4–10.8)

## 2019-12-10 LAB — COMPREHENSIVE METABOLIC PANEL
ALT: 12 IU/L (ref 0–44)
AST: 19 IU/L (ref 0–40)
Albumin/Globulin Ratio: 0.7 — ABNORMAL LOW (ref 1.2–2.2)
Albumin: 3 g/dL — ABNORMAL LOW (ref 3.8–4.8)
Alkaline Phosphatase: 181 IU/L — ABNORMAL HIGH (ref 48–121)
BUN/Creatinine Ratio: 15 (ref 10–24)
BUN: 13 mg/dL (ref 8–27)
Bilirubin Total: 1.1 mg/dL (ref 0.0–1.2)
CO2: 22 mmol/L (ref 20–29)
Calcium: 8.2 mg/dL — ABNORMAL LOW (ref 8.6–10.2)
Chloride: 103 mmol/L (ref 96–106)
Creatinine, Ser: 0.84 mg/dL (ref 0.76–1.27)
GFR calc Af Amer: 107 mL/min/{1.73_m2} (ref >59)
GFR calc non Af Amer: 93 mL/min/{1.73_m2} (ref >59)
Globulin, Total: 4.1 g/dL (ref 1.5–4.5)
Glucose: 130 mg/dL — ABNORMAL HIGH (ref 65–99)
Potassium: 3.9 mmol/L (ref 3.5–5.2)
Sodium: 135 mmol/L (ref 134–144)
Total Protein: 7.1 g/dL (ref 6.0–8.5)

## 2019-12-10 LAB — PROTIME-INR
INR: 1.1 (ref 0.9–1.2)
Prothrombin Time: 11.9 s (ref 9.1–12.0)

## 2019-12-10 NOTE — Patient Instructions (Signed)
1.  Avoid sodas, diet drinks, tea, and high caffeine drinks.  Drink mostly water during the day. 2.  Minimize fluids after 7 PM, and urinate right before going to bed 3.  Weight loss and activity will help improve your urinary symptoms

## 2019-12-10 NOTE — Addendum Note (Signed)
Addended by: Donalee Citrin on: 12/10/2019 01:14 PM   Modules accepted: Orders

## 2019-12-10 NOTE — Progress Notes (Signed)
   12/10/2019 9:40 AM   [name] 02-Dec-1955 [id number]  Reason for visit: Follow up urinary symptoms, scrotal pain  HPI: I saw [name] in urology clinic today for evaluation of worsening urinary symptoms and scrotal pain.  He is an extremely comorbid 65 year old male with morbid obesity, AAA repair, cirrhosis, coronary artery disease, chronic pain on narcotics, and hidradenitis.   He also takes 40 mg Lasix daily.  He is wheelchair-bound secondary to right-sided hip pain, but has not been able to undergo surgery secondary to thrombocytopenia.  He has a history of a negative hematuria work-up in 2018 with Dr. Pilar Jarvis, and cystoscopy showed a small friable prostate, and prostate measured 20 g on CT.  His primary complaints today are wide ranging.  He reports feeling of incomplete emptying and weak stream, but also urgency, frequency, and urge incontinence.  He drinks 2 L of soda during the day.  He has nocturia 2-3 times overnight.  He takes his Lasix in the morning.  He denies any dysuria or gross hematuria.  He was unable to void for urine sample today, and bladder scan was 0 mL.  On exam, he is morbidly obese with significant pannus overlying the groin.  Uncircumcised phallus with patent meatus.  Testicles 15 cc and descended bilaterally without masses or any tenderness on exam.  I had a very frank conversation with the patient and his wife today about his urinary symptoms in the setting of his numerous comorbidities and ongoing high consumption of > 2 L of soda per day.  I am very hesitant to try him on any medications for either BPH or overactive bladder secondary to potential side effects of lightheadedness, falls, confusion, or dizziness.  I suspect the majority of his symptoms are related to his obesity, high volume intake of sodas, Lasix use, and limited mobility in wheelchair with inability to get to the restroom in time.  Finally, regarding his scrotal pain, he reports this is  been present for 7 or 8 months, his exam is completely benign, and I do not see a role for a scrotal ultrasound at this time.  Again I think weight loss and improve mobility out of the wheelchair will improve these symptoms.  He has chronic pain at baseline on narcotics.  Lab visit next week for urinalysis and culture to rule out UTI, though low suspicion RTC 1 year for symptom check  I spent 30 total minutes on the day of the encounter including pre-visit review of the medical record, face-to-face time with the patient, and post visit ordering of labs/imaging/tests.  Billey Co, [facility] [address], [place], [place] [address] [phone]

## 2019-12-11 ENCOUNTER — Ambulatory Visit: Payer: Medicare HMO | Admitting: Gastroenterology

## 2019-12-11 ENCOUNTER — Other Ambulatory Visit: Payer: Self-pay | Admitting: Family Medicine

## 2019-12-12 DIAGNOSIS — M1611 Unilateral primary osteoarthritis, right hip: Secondary | ICD-10-CM | POA: Diagnosis not present

## 2019-12-12 DIAGNOSIS — G600 Hereditary motor and sensory neuropathy: Secondary | ICD-10-CM | POA: Diagnosis not present

## 2019-12-12 DIAGNOSIS — R069 Unspecified abnormalities of breathing: Secondary | ICD-10-CM | POA: Diagnosis not present

## 2019-12-12 DIAGNOSIS — R296 Repeated falls: Secondary | ICD-10-CM | POA: Diagnosis not present

## 2019-12-12 NOTE — Telephone Encounter (Signed)
E-scribed refills.  Plz schedule wellness, cpe and lab visits.

## 2019-12-16 ENCOUNTER — Ambulatory Visit: Payer: Medicare HMO

## 2019-12-18 ENCOUNTER — Other Ambulatory Visit: Payer: Medicare HMO

## 2019-12-18 ENCOUNTER — Other Ambulatory Visit: Payer: Self-pay

## 2019-12-18 ENCOUNTER — Encounter: Payer: Self-pay | Admitting: Gastroenterology

## 2019-12-18 DIAGNOSIS — R399 Unspecified symptoms and signs involving the genitourinary system: Secondary | ICD-10-CM | POA: Diagnosis not present

## 2019-12-18 DIAGNOSIS — N5082 Scrotal pain: Secondary | ICD-10-CM

## 2019-12-19 LAB — URINALYSIS, COMPLETE
Bilirubin, UA: NEGATIVE
Glucose, UA: NEGATIVE
Ketones, UA: NEGATIVE
Leukocytes,UA: NEGATIVE
Nitrite, UA: NEGATIVE
Protein,UA: NEGATIVE
Specific Gravity, UA: 1.015 (ref 1.005–1.030)
Urobilinogen, Ur: 4 mg/dL — ABNORMAL HIGH (ref 0.2–1.0)
pH, UA: 7 (ref 5.0–7.5)

## 2019-12-19 LAB — MICROSCOPIC EXAMINATION: Bacteria, UA: NONE SEEN

## 2019-12-22 ENCOUNTER — Telehealth: Payer: Self-pay

## 2019-12-22 NOTE — Telephone Encounter (Signed)
Called pt no answer. LM for pt per DPR. Advised pt to call back for questions or concerns.

## 2019-12-22 NOTE — Telephone Encounter (Signed)
-----   Message from Billey Co, MD sent at 12/20/2019 11:23 AM EDT ----- No evidence of infection or blood on the urine sample last week, this is good news.  Keep follow-up as scheduled in 1 year  Nickolas Madrid, MD 12/20/2019

## 2019-12-23 ENCOUNTER — Ambulatory Visit: Payer: Medicare HMO

## 2019-12-23 DIAGNOSIS — G894 Chronic pain syndrome: Secondary | ICD-10-CM | POA: Diagnosis not present

## 2019-12-23 DIAGNOSIS — Z79899 Other long term (current) drug therapy: Secondary | ICD-10-CM | POA: Diagnosis not present

## 2019-12-23 DIAGNOSIS — M25551 Pain in right hip: Secondary | ICD-10-CM | POA: Diagnosis not present

## 2019-12-23 DIAGNOSIS — M25511 Pain in right shoulder: Secondary | ICD-10-CM | POA: Diagnosis not present

## 2019-12-23 DIAGNOSIS — Z79891 Long term (current) use of opiate analgesic: Secondary | ICD-10-CM | POA: Diagnosis not present

## 2019-12-23 DIAGNOSIS — G600 Hereditary motor and sensory neuropathy: Secondary | ICD-10-CM | POA: Diagnosis not present

## 2019-12-23 DIAGNOSIS — M542 Cervicalgia: Secondary | ICD-10-CM | POA: Diagnosis not present

## 2019-12-23 DIAGNOSIS — M545 Low back pain: Secondary | ICD-10-CM | POA: Diagnosis not present

## 2019-12-24 DIAGNOSIS — G600 Hereditary motor and sensory neuropathy: Secondary | ICD-10-CM | POA: Diagnosis not present

## 2019-12-27 ENCOUNTER — Other Ambulatory Visit: Payer: Self-pay | Admitting: Family Medicine

## 2019-12-29 ENCOUNTER — Ambulatory Visit
Admission: RE | Admit: 2019-12-29 | Discharge: 2019-12-29 | Disposition: A | Discharge status: Home / Self Care | Payer: Medicare HMO | Source: Ambulatory Visit | Attending: Gastroenterology | Admitting: Gastroenterology

## 2019-12-29 ENCOUNTER — Telehealth (INDEPENDENT_AMBULATORY_CARE_PROVIDER_SITE_OTHER): Payer: Medicare HMO | Admitting: Gastroenterology

## 2019-12-29 ENCOUNTER — Other Ambulatory Visit: Payer: Self-pay

## 2019-12-29 DIAGNOSIS — K7460 Unspecified cirrhosis of liver: Secondary | ICD-10-CM | POA: Insufficient documentation

## 2019-12-29 DIAGNOSIS — R110 Nausea: Secondary | ICD-10-CM | POA: Diagnosis not present

## 2019-12-29 DIAGNOSIS — R188 Other ascites: Secondary | ICD-10-CM

## 2019-12-29 DIAGNOSIS — D696 Thrombocytopenia, unspecified: Secondary | ICD-10-CM | POA: Diagnosis not present

## 2019-12-29 NOTE — Progress Notes (Signed)
[name] , MD [address]  [place]  [place],  [address]  Main: [phone]  Fax: [phone]   Primary Care Physician: Ria Bush, MD  Virtual Visit via Telephone Note  I connected with patient on 12/29/19 at  1:00 PM EDT by telephone and verified that I am speaking with the correct person using two identifiers.   I discussed the limitations, risks, security and privacy concerns of performing an evaluation and management service by telephone and the availability of in person appointments. I also discussed with the patient that there may be a patient responsible charge related to this service. The patient expressed understanding and agreed to proceed.  Location of Patient: Home Location of Provider: Home Persons involved: Patient and provider only   History of Present Illness:  Liver cirrhosis follow-up  HPI: [name] is a 64 y.o. male    Summary of history :  He was initially referred and seen on 02/13/18 for cirrhosis of the liver.He has been seen by [facility] in the past.. His Cirrhosis had been attriuted to NASH. Used cocaine in his 20's, none recently. Weighed 370 lbs at his heaviest. Was always " big", he is not diabetic.   EGD 01/2017 -PHG, mild stricture was dilated.  RUQ USG 11/2017 : Small ascites, cirrhosis, Chronic dilation of CBD, mild dilation of PD, splenomegaly   CT angio of the abdomen 12/2017:Post endovascular repair of suprarenal abdominal aortic aneurysm without evidence of complication.Suspected hemodynamically significant stenosis involving the origin of the common hepatic artery, similar to the 12/2015 examination.Potential hemodynamically significant narrowing involving theproximal aspect of the right SFA.Findings of hepatic cirrhosis and portal venous hypertension with several varices about the gastroesophageal junction, splenomegaly and trace amount of intra-abdominal ascites. No discrete hyperenhancing hepatic  lesions  02/20/18 ; EGD -normal -no eosinophils in esophagus.  02/13/18 : Cr 1.03,Hep A ab positive , albumin 3.0 ,alk phos 149 , Hbsab/ag,HCV ab , ANA,negative .Ceruloplasmin ,alpha 1 AT,Iron studies - normal   Smooth muscle antibody positive , elevated immunoglobulins G, AMA,LKM ab negative 03/19/18 : liver biopsy : steatosis , focal plasma cells, mild chronic cholestasis. Unmable to get a diagnosis due to burned out liver disease. 04/25/2019 : INR : 1.3 , MELD 10,Cr 0.94 05/15/2019:MRI liver cirrhotic liver, dilated CBD, no obstruction . Splenomegaly and trace ascites 05/12/2019: EGD: Normal Hospital admission in 03/26/2019 for ascites, underwent paracentesis.    Interval history7/11/2019-12/29/2019  12/09/2019: INR 1.1, hemoglobin 11.9 g, platelet count 48, creatinine 0.84, albumin 3.0, total bilirubin 1.1, sodium 135.  Meld score of 8  12/29/2019: Right upper quadrant ultrasound hepatic cirrhosis, dilated extrahepatic common bile duct nonspecific remains similar to what was seen previously.  No intrahepatic biliary dilation.  The patient was supposedly set to undergo orthopedic surgery but was canceled due to her low platelet count.  At his last office visit I suggested to him that we had a new drug which could be used to boost platelet count resurgery in cirrhotic patients.  My office has tried on multiple occasions to get in touch with his orthopedic surgeon so that we could coordinate the use of this drug to make his platelet count conducive for surgery but we have not been successful.  We have sent a letter out to his physician.   Main complaint today is nausea.    He has been taking morphine recently.  On Zofran as needed takes his PPI.  Current Outpatient Medications  Medication Sig Dispense Refill  .  albuterol (PROVENTIL HFA;VENTOLIN HFA) 108 (90 Base) MCG/ACT inhaler Inhale 2 puffs into the lungs every 6 (six) hours as needed for wheezing or shortness of breath. 1  Inhaler 0  . albuterol (PROVENTIL) (2.5 MG/3ML) 0.083% nebulizer solution USE 1 VIAL PER NEBULIZER EVERY 6 HRS AS NEEDED FOR WHEEZING 75 mL 6  . aspirin 81 MG tablet Take 1 tablet (81 mg total) by mouth daily.    . B Complex-C (SUPER B COMPLEX PO) Take 1 tablet by mouth daily.    . Ca Phosphate-Cholecalciferol [phone] MG-UNIT TABS Take 1 tablet by mouth daily.     . cholecalciferol 2000 units TABS Take 2,000 Units by mouth daily.    . Cobalamin Combinations (B-12) 1000-400 MCG SUBL Take 1 tablet by mouth daily.     [name] dicyclomine (BENTYL) 20 MG tablet TAKE 1 TABLET BY MOUTH 3 TIMES DAILY BEFORE MEALS. AS NEEDED FOR ABDOMINAL PAIN 30 tablet 1  . DULoxetine (CYMBALTA) 60 MG capsule TAKE 1 CAPSULE BY MOUTH EVERY DAY 90 capsule 0  . fentaNYL (DURAGESIC - DOSED MCG/HR) 50 MCG/HR Place 1 patch (50 mcg total) every other day onto the skin. 15 patch 0  . ferrous sulfate 325 (65 FE) MG tablet Take 1 tablet (325 mg total) by mouth every other day.    . fluticasone (FLONASE) 50 MCG/ACT nasal spray Place 2 sprays into both nostrils daily as needed for allergies.     . folic acid (FOLVITE) 1 MG tablet Take 1 tablet (1 mg total) by mouth daily. 90 tablet 3  . furosemide (LASIX) 40 MG tablet TAKE 1 TABLET BY MOUTH EVERY DAY 90 tablet 1  . irbesartan (AVAPRO) 75 MG tablet Take 1 tablet (75 mg total) by mouth daily. 90 tablet 3  . Lactulose 20 GM/30ML SOLN Take 30 mLs (20 g total) by mouth 3 (three) times daily. 2700 mL 5  . methocarbamol (ROBAXIN) 500 MG tablet TAKE 1 TABLET (500 MG TOTAL) BY MOUTH AT BEDTIME. 30 tablet 0  . metoprolol succinate (TOPROL-XL) 25 MG 24 hr tablet TAKE 1/2 TABLET BY MOUTH EVERY DAY 45 tablet 0  . Misc Natural Products (TART CHERRY ADVANCED) CAPS Take 1 capsule by mouth 2 (two) times daily.    . nitroGLYCERIN (NITROSTAT) 0.4 MG SL tablet Place 1 tablet (0.4 mg total) under the tongue every 5 (five) minutes as needed for chest pain. 25 tablet 12  . nystatin cream (MYCOSTATIN) Apply 1  application topically 2 (two) times daily. 80 g 0  . omeprazole (PRILOSEC) 40 MG capsule TAKE 1 CAPSULE BY MOUTH EVERY DAY 90 capsule 3  . ondansetron (ZOFRAN) 4 MG tablet Take 1 tablet (4 mg total) by mouth every 8 (eight) hours as needed for nausea or vomiting. 30 tablet 1  . oxyCODONE (ROXICODONE) 15 MG immediate release tablet Take 1 tablet (15 mg total) by mouth every 4 (four) hours as needed. (Patient taking differently: Take 90 mg by mouth daily. Take 6 tablets by mouth daily per patient) 90 tablet 0  . Red Yeast Rice 600 MG CAPS Take 2 capsules by mouth daily.    [name] spironolactone (ALDACTONE) 100 MG tablet TAKE 1 TABLET BY MOUTH EVERY DAY 90 tablet 1  . vitamin B-12 (CYANOCOBALAMIN) 500 MCG tablet Take 1 tablet (500 mcg total) by mouth every Monday, Wednesday, and Friday.     No current facility-administered medications for this visit.    Allergies as of 12/29/2019 - Review Complete 12/10/2019  Allergen Reaction Noted  . Statins Shortness  Of Breath 01/22/2012  . Losartan Other (See Comments) 01/30/2013  . Allopurinol Nausea Only 12/18/2012  . Baclofen Nausea And Vomiting 06/23/2015  . Gabapentin Nausea And Vomiting 04/21/2015  . Penicillins Nausea And Vomiting 02/20/2018  . Tramadol Nausea Only 03/13/2017    Review of Systems:    All systems reviewed and negative except where noted in HPI.   Observations/Objective:  Labs: CMP     Component Value Date/Time   NA 135 12/09/2019 1613   K 3.9 12/09/2019 1613   CL 103 12/09/2019 1613   CO2 22 12/09/2019 1613   GLUCOSE 130 (H) 12/09/2019 1613   GLUCOSE 110 (H) 07/30/2019 1137   BUN 13 12/09/2019 1613   CREATININE 0.84 12/09/2019 1613   CREATININE 0.80 04/09/2015 1530   CALCIUM 8.2 (L) 12/09/2019 1613   PROT 7.1 12/09/2019 1613   ALBUMIN 3.0 (L) 12/09/2019 1613   AST 19 12/09/2019 1613   ALT 12 12/09/2019 1613   ALKPHOS 181 (H) 12/09/2019 1613   BILITOT 1.1 12/09/2019 1613   GFRNONAA 93 12/09/2019 1613   GFRNONAA >89  01/05/2014 1159   GFRAA 107 12/09/2019 1613   GFRAA >89 01/05/2014 1159   Lab Results  Component Value Date   WBC 3.7 12/09/2019   HGB 11.9 (L) 12/09/2019   HCT 34.7 (L) 12/09/2019   MCV 99 (H) 12/09/2019   PLT 48 (LL) 12/09/2019    Imaging Studies: No results found.  Assessment and Plan:   [name] is a 64 y.o. y/o malehere to follow upfor liver cirrhosis. In the past been felt by his last GI in 2018 was probably NASH related, autoimmune work upshows a positive smooth muscle ab and elevated immunoglobulins.MELD8Liver biopsy is inconclusive due to burnt out liver.No evidence of hepatic encephalopathy.  Main complaint is nausea today which is very likely probably secondary to gastroparesis from the morphine that he has been taking.   Plan  1.Continue Aldactone 100, Lasix 40, lactulose titrated to 1-2 soft bowel movements per day. 2. Suggest MR I of liver in January 2022 to evaluate liver and distal common bile duct as it been some chronic dilation in the past. 3.EGDto screen for varices in 2022 December 4. Low-salt diet 5.  My office has tried to get in touch with his orthopedic surgeon to coordinate the use of this new drug to help boost platelet count prior to surgery.  Unfortunately we have not been able to get in touch with him.  We have faxed a Letter indicating their office to get in touch with [place] so that we can coordinate the whole process.   I have informed the patient to inform his orthopedic surgeon to get in touch with [place] if he would need our assistance in helping boost his platelet count.    Avatrombopag is used if platelet count 40,000 to <50,000/mm3: 40 mg once daily for 5 consecutive day.In a phase III trial, 227 individuals with liver disease who had platelet counts <50,000/microL and required an invasive procedure were randomly assigned to receive avatrombopag or placebo daily for five days and assessed for the need for platelet  transfusions up to seven days after procedure . Avatrombopag increased the platelet count and reduced the need for transfusions (transfusions given in 12 to 34 percent of the eltrombopag group versus 62 to 77 percent of the placebo group). There was no difference in bleeding events, and no thrombotic events after close assessment of the portal vein system by ultrasound . 6.   Zofran  for nausea continue Prilosec.   I discussed the assessment and treatment plan with the patient. The patient was provided an opportunity to ask questions and all were answered. The patient agreed with the plan and demonstrated an understanding of the instructions.   The patient was advised to call back or seek an in-person evaluation if the symptoms worsen or if the condition fails to improve as anticipated.  I provided 15 minutes of non-face-to-face time during this encounter.  Dr [name] MD,MRCP [facility]) Gastroenterology/Hepatology Pager: [phone]   Speech recognition software was used to dictate this note.

## 2019-12-29 NOTE — Telephone Encounter (Signed)
Patient was last seen 07/28/19 Rx was last refilled on 12/26/19 for #90 with 3 refills

## 2020-01-01 DIAGNOSIS — L821 Other seborrheic keratosis: Secondary | ICD-10-CM | POA: Diagnosis not present

## 2020-01-02 ENCOUNTER — Ambulatory Visit: Payer: Medicare HMO | Admitting: Family Medicine

## 2020-01-07 NOTE — Progress Notes (Signed)
MRN : [id number]  [name] is a 64 y.o. ([birth year]) male who presents with chief complaint of No chief complaint on file. [name]  History of Present Illness:   The patient is seen for follow up evaluation of AAA status post duplex ultrasound. The patient denies interval development of abdominal or back pain. No new lower extremity pain or discoloration of the toes.  He is currently in a wheelchair waiting for a hip replacement.   The patient has a history of coronary artery disease, no recent episodes of angina or shortness of breath. The patient denies interval anaurosis fugax. There is o recent history of TIA symptoms or focal motor deficits. The patient denies PAD or claudication symptoms. There is a history of hyperlipidemia which is being treated with a statin.   Previous CT angiography of the abdomen and pelvis shows apatent endograft with suprarenal fixation there is moderate stenosis of the celiac which is noted in prior scans unchanged SMA is patent the bilateral renal stents are patent theinfrarenal AAA sac measures 2.6cm.No endo-leaks are noted.  Duplex ultrasound of the aorta iliac arteries shows a Stent graft in good position no endoleak with a sac size of 2.95 cm.  (Previous duplex ultrasound of the aorta iliac arteries shows a Stent graft in good position no endoleak with a sac size of 3.2 cm)  No outpatient medications have been marked as taking for the 01/08/20 encounter (Appointment) with Delana Meyer, Dolores Lory, MD.    Past Medical History:  Diagnosis Date  . AAA (abdominal aortic aneurysm) ([place]) 09/2012--  monitored by dr Trula Slade   stable 5.6cm CTA abdomen 2016  . Abnormal drug screen 07/09/2016   1/2/018 - positive oxycodone, fentanyl, inapprop positive MJ - mod risk  . Allergic rhinitis   . Ascites 03/2019  . B12 deficiency   . CAD (coronary artery disease) cardiologist-  dr Stanford Breed   x3 with stents last 2005, EF 40%, predominantly RCA by CT 2016  .  Cataracts, bilateral   . Cervical spondylosis 05/2010   s/p surgery  . Charcot Marie Tooth muscular atrophy dx  [phone]   neurologist--  dr love--  type 2 per pt  . Chronic pain syndrome    established with [facility] ([place]) --> disagreement and transfered care to Dr Sanjuan Dame at [facility], requests PCP write Rx but f/u with pain clinic Q6-12 months  . COPD (chronic obstructive pulmonary disease) ([place]) 10/2011   minimal by PFTs  . DDD (degenerative disc disease)   . Disturbances of sensation of smell and taste    improving  . Dyspnea on exertion   . GERD (gastroesophageal reflux disease)   . Gout   . Headache   . Hepatitis    hepatitis B  . Hidradenitis    right groin  . Hidradenitis suppurativa dx 2011   goin and leg crease   followd by Lyndle Herrlich - daily bactrim, s/p intralesional steroid injection 10/2010  . Hip osteoarthritis    s/p intraarticular steroid shot (12/2012) ([place])  . History of hepatitis B 1983  . History of MI (myocardial infarction)    2000  &  2005  . History of pneumonia   . History of viral meningitis 2000  . HLD (hyperlipidemia)   . HTN (hypertension)   . Ischemic cardiomyopathy    s/p inferior MI  --  current ef per myoview 39%  . Liver cirrhosis secondary to NASH ([place]) 01/2014   by CT scan,  rec virtual colonoscopy by Dr Collene Mares 06/2014  . Lumbar herniated disc   . Myocardial infarction ([place])    x2  . Nocturia more than twice per night   . Obesity   . Spinal stenosis    released from [place].  established with preferred pain (07/2013)  . T2DM (type 2 diabetes mellitus) ([place])    ABIs WNL 2016  . Vitamin D deficiency     Past Surgical History:  Procedure Laterality Date  . ABDOMINAL AORTIC ENDOVASCULAR FENESTRATED STENT GRAFT N/A 11/30/2015   Procedure: ABDOMINAL AORTIC ENDOVASCULAR FENESTRATED STENT GRAFT;  Surgeon: Serafina Mitchell, MD;  Location: [place];  Service: Vascular;  Laterality: N/A;  . ANTERIOR CERVICAL  DECOMP/DISCECTOMY FUSION  01-07-2010    C4 -- C7  . CARDIAC CATHETERIZATION  03-30-2005  dr Albertine Patricia   ef 40% w/ inferior akinesis/  LM and CFX angiographically normal/  pLAD 30%/   Widely patent stents in RCA and PDA widely patent  . CARDIOVASCULAR STRESS TEST  10-23-2012  dr Stanford Breed   No ischemia/  Moderate scar in the inferior wall, otherwise normal perfusion/  LV ef 39%,  LV wall motion: inferior/ inferolateral hypokinesis  . COLONOSCOPY  05/06/2007   normal, small int hemorrhoids rpt 5 yrs due to fmhx - rec against rpt colonoscopy by Dr Collene Mares  . CORONARY ANGIOPLASTY  2000  dr Stanford Breed   PCI to RCA and PDA  . CORONARY ANGIOPLASTY WITH STENT PLACEMENT  03-19-2005  dr Gwyndolyn Saxon downey   inferior STEMI--- DES x4 to RCA w/ balloon angioplasty and balloon angioplasty to jailed PDA ostium/  severe hypokinesis of midinferor wall, ef 50%/  dLM 20%,  mLAD 20%,  dCFX 60%  . ESOPHAGOGASTRODUODENOSCOPY  01/2017   dilated benign esophageal stenosis, portal hypertensive gastropathy Henrene Pastor)  . ESOPHAGOGASTRODUODENOSCOPY (EGD) WITH PROPOFOL N/A 02/20/2018   benign biopsy [name], MD)  . ESOPHAGOGASTRODUODENOSCOPY (EGD) WITH PROPOFOL N/A 05/12/2019   Procedure: ESOPHAGOGASTRODUODENOSCOPY (EGD) WITH PROPOFOL;  Surgeon: [name], MD;  Location: [facility] ENDOSCOPY;  Service: Gastroenterology;  Laterality: N/A;  . HYDRADENITIS EXCISION Right 12/31/2014   Procedure: WIDE EXCISION HIDRADENITIS GROIN; Coralie Keens, MD  . IR PARACENTESIS  03/25/2019  . LUMBAR DISC SURGERY     L5-S1  . LUMBAR LAMINECTOMY  05-18-2010   L2--5   laminectomy/foraminotomy for stenosis Joya Salm)  . MYELOGRAM     L5-S1 and L1-2 spondylosis  . SACROILIAC JOINT INJECTION Bilateral 10/2013   [place]  . TONSILLECTOMY AND ADENOIDECTOMY  1972    Social History Social History   Tobacco Use  . Smoking status: Former Smoker    Packs/day: 1.00    Years: 57.00    Pack years: 57.00    Types: Cigarettes, E-cigarettes    Start date:  06/06/1967    Quit date: 07/21/2019    Years since quitting: 0.4  . Smokeless tobacco: Never Used  . Tobacco comment: stopped smoking a pipe in 2015  DOES SMOKE E CIG  Vaping Use  . Vaping Use: Former  Substance Use Topics  . Alcohol use: No    Alcohol/week: 0.0 standard drinks  . Drug use: Yes    Types: Fentanyl, Hydrocodone    Family History Family History  Problem Relation Age of Onset  . Cancer Mother        colon  . Diabetes Mother   . Kidney disease Mother   . Aneurysm Mother        AAA  . Rheum arthritis Mother   . Charcot-Marie-Tooth disease Mother   .  Heart disease Mother        before age 33  . Cancer Father        skin  . Heart attack Father   . Heart disease Father        before age 39  . Cancer Brother        skin  . Coronary artery disease Brother   . Cancer Brother        small cell lung cancer  . Aneurysm Brother        AAA  . Rheum arthritis Sister   . Rheum arthritis Brother   . Prostate cancer Neg Hx   . Bladder Cancer Neg Hx   . Kidney cancer Neg Hx     Allergies  Allergen Reactions  . Statins Shortness Of Breath    Cough, trouble breathing Cough, trouble breathing  . Losartan Other (See Comments)    Causes him to have pain  . Allopurinol Nausea Only  . Baclofen Nausea And Vomiting  . Gabapentin Nausea And Vomiting  . Penicillins Nausea And Vomiting    Did it involve swelling of the face/tongue/throat, SOB, or low BP? N/A Did it involve sudden or severe rash/hives, skin peeling, or any reaction on the inside of your mouth or nose? N/A Did you need to seek medical attention at a hospital or doctor's office? N/A When did it last happen? Child If all above answers are "NO", may proceed with cephalosporin use.  . Tramadol Nausea Only     REVIEW OF SYSTEMS (Negative unless checked)  Constitutional: [] Weight loss  [] Fever  [] Chills Cardiac: [] Chest pain   [] Chest pressure   [] Palpitations   [] Shortness of breath when laying flat    [] Shortness of breath with exertion. Vascular:  [x] Pain in legs with walking   [] Pain in legs at rest  [] History of DVT   [] Phlebitis   [x] Swelling in legs   [] Varicose veins   [] Non-healing ulcers Pulmonary:   [] Uses home oxygen   [] Productive cough   [] Hemoptysis   [] Wheeze  [] COPD   [] Asthma Neurologic:  [] Dizziness   [] Seizures   [] History of stroke   [] History of TIA  [] Aphasia   [] Vissual changes   [] Weakness or numbness in arm   [x] Weakness or numbness in leg Musculoskeletal:   [] Joint swelling   [x] Joint pain   [] Low back pain Hematologic:  [] Easy bruising  [] Easy bleeding   [] Hypercoagulable state   [] Anemic Gastrointestinal:  [] Diarrhea   [] Vomiting  [] Gastroesophageal reflux/heartburn   [] Difficulty swallowing. Genitourinary:  [] Chronic kidney disease   [] Difficult urination  [] Frequent urination   [] Blood in urine Skin:  [] Rashes   [] Ulcers  Psychological:  [] History of anxiety   []  History of major depression.  Physical Examination  There were no vitals filed for this visit. There is no height or weight on file to calculate BMI. Gen: WD/WN, NAD; seen in a wheelchair Head: [place]/AT, No temporalis wasting.  Ear/Nose/Throat: Hearing grossly intact, nares w/o erythema or drainage Eyes: PER, EOMI, sclera nonicteric.  Neck: Supple, no large masses.   Pulmonary:  Good air movement, no audible wheezing bilaterally, no use of accessory muscles.  Cardiac: RRR, no JVD Vascular: scattered varicosities present bilaterally.  Mild venous stasis changes to the legs bilaterally.  2+ soft pitting edema Vessel Right Left  Radial Palpable Palpable  Gastrointestinal: Non-distended. No guarding/no peritoneal signs.  Musculoskeletal: M/S 5/5 throughout.  No deformity or atrophy.  Neurologic: CN 2-12 intact. Symmetrical.  Speech is fluent. Motor exam  as listed above. Psychiatric: Judgment intact, Mood & affect appropriate for pt's clinical situation. Dermatologic: No rashes or ulcers noted.  No  changes consistent with cellulitis.   CBC Lab Results  Component Value Date   WBC 3.7 12/09/2019   HGB 11.9 (L) 12/09/2019   HCT 34.7 (L) 12/09/2019   MCV 99 (H) 12/09/2019   PLT 48 (LL) 12/09/2019    BMET    Component Value Date/Time   NA 135 12/09/2019 1613   K 3.9 12/09/2019 1613   CL 103 12/09/2019 1613   CO2 22 12/09/2019 1613   GLUCOSE 130 (H) 12/09/2019 1613   GLUCOSE 110 (H) 07/30/2019 1137   BUN 13 12/09/2019 1613   CREATININE 0.84 12/09/2019 1613   CREATININE 0.80 04/09/2015 1530   CALCIUM 8.2 (L) 12/09/2019 1613   GFRNONAA 93 12/09/2019 1613   GFRNONAA >89 01/05/2014 1159   GFRAA 107 12/09/2019 1613   GFRAA >89 01/05/2014 1159   CrCl cannot be calculated (Patient's most recent lab result is older than the maximum 21 days allowed.).  COAG Lab Results  Component Value Date   INR 1.1 12/09/2019   INR 1.2 (H) 07/30/2019   INR 1.3 (H) 04/25/2019    Radiology US Abdomen Limited RUQ  Result Date: 12/29/2019 CLINICAL DATA:  Hepatic cirrhosis EXAM: ULTRASOUND ABDOMEN LIMITED RIGHT UPPER QUADRANT COMPARISON:  05/15/2019, 12/28/2017 FINDINGS: Gallbladder: No gallstones or wall thickening visualized. No sonographic Murphy sign noted by sonographer. Common bile duct: Diameter: 12 mm, mildly dilated but unchanged compared to the prior CT Liver: Nodular surface and heterogeneous increased echogenicity compatible with known cirrhosis. No large focal abnormality by ultrasound or intrahepatic biliary dilatation. Portal vein is patent on color Doppler imaging with normal direction of blood flow towards the liver. Other: No free fluid or ascites IMPRESSION: Hepatic cirrhosis without focal abnormality by ultrasound. Dilated extrahepatic common bile duct as before remains nonspecific. No intrahepatic biliary dilatation. No free fluid or ascites Negative for gallstones. Electronically Signed   By: Jerilynn Mages.  Shick M.D.   On: 12/29/2019 08:54      Assessment/Plan 1. Abdominal aortic  aneurysm (AAA) without rupture (HCC) Recommend: Patient is status post successful endovascular repair of the AAA.   No further intervention is required at this time.   No endoleak is detected and the aneurysm sac is stable.  The patient will continue antiplatelet therapy as prescribed as well as aggressive management of hyperlipidemia. Exercise is again strongly encouraged.   However, endografts require continued surveillance with ultrasound or CT scan. This is mandatory to detect any changes that allow repressurization of the aneurysm sac.  The patient is informed that this would be asymptomatic.  The patient is reminded that lifelong routine surveillance is a necessity with an endograft. Patient will continue to follow-up at 12 month intervals with ultrasound of the aorta.  - VAS US AORTA/IVC/ILIACS; Future  2. Aortic atherosclerosis (HCC) Recommend:  I do not find evidence of life style limiting vascular disease. The patient specifically denies life style limitation.  Previous noninvasive studies including ABI's of the legs do not identify critical vascular problems.  The patient should continue walking and begin a more formal exercise program. The patient should continue his antiplatelet therapy and aggressive treatment of the lipid abnormalities.  The patient should begin wearing graduated compression socks 15-20 mmHg strength to control his edema.  3. Coronary artery disease involving native coronary artery of native heart without angina pectoris Continue cardiac and antihypertensive medications as already ordered and reviewed, no  changes at this time.  Continue statin as ordered and reviewed, no changes at this time  Nitrates PRN for chest pain   4. Essential hypertension Continue antihypertensive medications as already ordered, these medications have been reviewed and there are no changes at this time.    Hortencia Pilar, MD  01/07/2020 2:28 PM

## 2020-01-08 ENCOUNTER — Ambulatory Visit (INDEPENDENT_AMBULATORY_CARE_PROVIDER_SITE_OTHER): Payer: Medicare HMO

## 2020-01-08 ENCOUNTER — Encounter (INDEPENDENT_AMBULATORY_CARE_PROVIDER_SITE_OTHER): Payer: Self-pay | Admitting: Vascular Surgery

## 2020-01-08 ENCOUNTER — Ambulatory Visit (INDEPENDENT_AMBULATORY_CARE_PROVIDER_SITE_OTHER): Payer: Medicare PPO | Admitting: Vascular Surgery

## 2020-01-08 ENCOUNTER — Other Ambulatory Visit: Payer: Self-pay

## 2020-01-08 VITALS — BP 117/76 | HR 63 | Resp 16 | Wt 263.0 lb

## 2020-01-08 DIAGNOSIS — I2510 Atherosclerotic heart disease of native coronary artery without angina pectoris: Secondary | ICD-10-CM | POA: Diagnosis not present

## 2020-01-08 DIAGNOSIS — I10 Essential (primary) hypertension: Secondary | ICD-10-CM

## 2020-01-08 DIAGNOSIS — I714 Abdominal aortic aneurysm, without rupture, unspecified: Secondary | ICD-10-CM

## 2020-01-08 DIAGNOSIS — I700 Atherosclerosis of aorta: Secondary | ICD-10-CM | POA: Diagnosis not present

## 2020-01-09 ENCOUNTER — Ambulatory Visit (INDEPENDENT_AMBULATORY_CARE_PROVIDER_SITE_OTHER)
Admission: RE | Admit: 2020-01-09 | Discharge: 2020-01-09 | Disposition: A | Discharge status: Home / Self Care | Payer: Medicare HMO | Source: Ambulatory Visit | Attending: Family Medicine | Admitting: Family Medicine

## 2020-01-09 ENCOUNTER — Encounter (INDEPENDENT_AMBULATORY_CARE_PROVIDER_SITE_OTHER): Payer: Self-pay | Admitting: Vascular Surgery

## 2020-01-09 ENCOUNTER — Encounter: Payer: Self-pay | Admitting: Family Medicine

## 2020-01-09 ENCOUNTER — Ambulatory Visit (INDEPENDENT_AMBULATORY_CARE_PROVIDER_SITE_OTHER): Payer: Medicare HMO | Admitting: Family Medicine

## 2020-01-09 VITALS — BP 122/74 | HR 81 | Temp 97.9°F | Ht 69.0 in | Wt 265.2 lb

## 2020-01-09 DIAGNOSIS — K7460 Unspecified cirrhosis of liver: Secondary | ICD-10-CM

## 2020-01-09 DIAGNOSIS — E559 Vitamin D deficiency, unspecified: Secondary | ICD-10-CM

## 2020-01-09 DIAGNOSIS — M19071 Primary osteoarthritis, right ankle and foot: Secondary | ICD-10-CM | POA: Diagnosis not present

## 2020-01-09 DIAGNOSIS — D696 Thrombocytopenia, unspecified: Secondary | ICD-10-CM | POA: Diagnosis not present

## 2020-01-09 DIAGNOSIS — F332 Major depressive disorder, recurrent severe without psychotic features: Secondary | ICD-10-CM

## 2020-01-09 DIAGNOSIS — I10 Essential (primary) hypertension: Secondary | ICD-10-CM

## 2020-01-09 DIAGNOSIS — R413 Other amnesia: Secondary | ICD-10-CM

## 2020-01-09 DIAGNOSIS — S9781XA Crushing injury of right foot, initial encounter: Secondary | ICD-10-CM

## 2020-01-09 DIAGNOSIS — F17200 Nicotine dependence, unspecified, uncomplicated: Secondary | ICD-10-CM | POA: Diagnosis not present

## 2020-01-09 DIAGNOSIS — I714 Abdominal aortic aneurysm, without rupture, unspecified: Secondary | ICD-10-CM

## 2020-01-09 DIAGNOSIS — M1611 Unilateral primary osteoarthritis, right hip: Secondary | ICD-10-CM | POA: Diagnosis not present

## 2020-01-09 DIAGNOSIS — R7303 Prediabetes: Secondary | ICD-10-CM

## 2020-01-09 DIAGNOSIS — G894 Chronic pain syndrome: Secondary | ICD-10-CM

## 2020-01-09 DIAGNOSIS — R188 Other ascites: Secondary | ICD-10-CM

## 2020-01-09 NOTE — Patient Instructions (Addendum)
R foot xray today  For memory - check labwork today.  Give Dr Jefferson Fuel contact information for Dr Vicente Males.  Good to see you today. Return as needed or in 3 months for follow up visit.

## 2020-01-09 NOTE — Progress Notes (Signed)
This visit was conducted in person.  BP 122/74 (BP Location: Left Arm, Patient Position: Sitting, Cuff Size: Large)   Pulse 81   Temp 97.9 F (36.6 C) (Temporal)   Ht 5' 9"  (1.753 m)   Wt 265 lb 4 oz (120.3 kg)   SpO2 96%   BMI 39.17 kg/m    CC: f/u visit  Subjective:    Patient ID: [name], male    DOB: 07-Jan-1956, 64 y.o.   MRN: [id number]  HPI: [name] is a 64 y.o. male presenting on 01/09/2020 for Follow-up (Pt accomapnied by wife, [name]- temp 98.0) and Foot Pain (C/o right foot pain.  Started 2 wks ago.  Ran over foot with electric wheelchair. )    See recent chart for detail. Recent notes reviewed. Has recently seen GI, urology, vascular surgery.  Still awaiting R hip replacement - latest planned surgery cancelled on date of surgery due to low platelets. Has seen GI planned treatment immediately preop with Avatrombopag, but difficulty coordinating care between ortho and GI.  Has restarted smoking 1+ PPD due to ongoing stressors over hip surgery. Had previously quit.   NASH cirrhosis - continues lasix 72m and spironolactone 1038mdaily.   New pain medication - fentanyl + morphine sulfate followed by pain clinic - now off oxycodone due to concern over tolerance.  Wife concerned about worsening memory.   Ran over right foot several weeks ago with electric wheelchair. Initial bruising, now better but with persistent foot pain predominantly at dorsal midfoot.  Lab Results  Component Value Date   HGBA1C 5.1 01/09/2020        Relevant past medical, surgical, family and social history reviewed and updated as indicated. Interim medical history since our last visit reviewed. Allergies and medications reviewed and updated. Outpatient Medications Prior to Visit  Medication Sig Dispense Refill  . albuterol (PROVENTIL HFA;VENTOLIN HFA) 108 (90 Base) MCG/ACT inhaler Inhale 2 puffs into the lungs every 6 (six) hours as needed for wheezing or shortness of  breath. 1 Inhaler 0  . albuterol (PROVENTIL) (2.5 MG/3ML) 0.083% nebulizer solution USE 1 VIAL PER NEBULIZER EVERY 6 HRS AS NEEDED FOR WHEEZING 75 mL 6  . aspirin 81 MG tablet Take 1 tablet (81 mg total) by mouth daily.    . B Complex-C (SUPER B COMPLEX PO) Take 1 tablet by mouth daily.    . Ca Phosphate-Cholecalciferol [phone] MG-UNIT TABS Take 1 tablet by mouth daily.     . cholecalciferol 2000 units TABS Take 2,000 Units by mouth daily.    . Cobalamin Combinations (B-12) 1000-400 MCG SUBL Take 1 tablet by mouth daily.     . DULoxetine (CYMBALTA) 60 MG capsule TAKE 1 CAPSULE BY MOUTH EVERY DAY 90 capsule 0  . fentaNYL (DURAGESIC - DOSED MCG/HR) 50 MCG/HR Place 1 patch (50 mcg total) every other day onto the skin. (Patient taking differently: Place 75 mcg onto the skin every other day. ) 15 patch 0  . ferrous sulfate 325 (65 FE) MG tablet Take 1 tablet (325 mg total) by mouth every other day.    . fluticasone (FLONASE) 50 MCG/ACT nasal spray Place 2 sprays into both nostrils daily as needed for allergies.     . folic acid (FOLVITE) 1 MG tablet TAKE 1 TABLET BY MOUTH EVERY DAY 90 tablet 3  . furosemide (LASIX) 40 MG tablet TAKE 1 TABLET BY MOUTH EVERY DAY 90 tablet 1  . gabapentin (NEURONTIN) 100 MG capsule Take 1 capsule (100  mg total) by mouth 3 (three) times daily.    . irbesartan (AVAPRO) 75 MG tablet Take 1 tablet (75 mg total) by mouth daily. 90 tablet 3  . Lactulose 20 GM/30ML SOLN Take 30 mLs (20 g total) by mouth 3 (three) times daily. 2700 mL 5  . methocarbamol (ROBAXIN) 500 MG tablet TAKE 1 TABLET (500 MG TOTAL) BY MOUTH AT BEDTIME. 30 tablet 0  . metoprolol succinate (TOPROL-XL) 25 MG 24 hr tablet TAKE 1/2 TABLET BY MOUTH EVERY DAY 45 tablet 0  . Misc Natural Products (TART CHERRY ADVANCED) CAPS Take 1 capsule by mouth 2 (two) times daily.    [name] morphine (MS CONTIN) 15 MG 12 hr tablet Take 15 mg by mouth in the morning, at noon, in the evening, and at bedtime.    . nitroGLYCERIN  (NITROSTAT) 0.4 MG SL tablet Place 1 tablet (0.4 mg total) under the tongue every 5 (five) minutes as needed for chest pain. 25 tablet 12  . nystatin cream (MYCOSTATIN) Apply 1 application topically 2 (two) times daily. 80 g 0  . omeprazole (PRILOSEC) 40 MG capsule TAKE 1 CAPSULE BY MOUTH EVERY DAY 90 capsule 3  . ondansetron (ZOFRAN) 4 MG tablet Take 1 tablet (4 mg total) by mouth every 8 (eight) hours as needed for nausea or vomiting. 30 tablet 1  . Red Yeast Rice 600 MG CAPS Take 2 capsules by mouth daily.    [name] spironolactone (ALDACTONE) 100 MG tablet TAKE 1 TABLET BY MOUTH EVERY DAY 90 tablet 1  . vitamin B-12 (CYANOCOBALAMIN) 500 MCG tablet Take 1 tablet (500 mcg total) by mouth every Monday, Wednesday, and Friday.    . gabapentin (NEURONTIN) 100 MG capsule     . dicyclomine (BENTYL) 20 MG tablet TAKE 1 TABLET BY MOUTH 3 TIMES DAILY BEFORE MEALS. AS NEEDED FOR ABDOMINAL PAIN (Patient not taking: Reported on 01/09/2020) 30 tablet 1  . oxyCODONE (ROXICODONE) 15 MG immediate release tablet Take 1 tablet (15 mg total) by mouth every 4 (four) hours as needed. (Patient not taking: Reported on 01/08/2020) 90 tablet 0   No facility-administered medications prior to visit.     Per HPI unless specifically indicated in ROS section below Review of Systems Objective:  BP 122/74 (BP Location: Left Arm, Patient Position: Sitting, Cuff Size: Large)   Pulse 81   Temp 97.9 F (36.6 C) (Temporal)   Ht 5' 9"  (1.753 m)   Wt 265 lb 4 oz (120.3 kg)   SpO2 96%   BMI 39.17 kg/m   Wt Readings from Last 3 Encounters:  01/09/20 265 lb 4 oz (120.3 kg)  01/08/20 263 lb (119.3 kg)  12/10/19 262 lb (118.8 kg)      Physical Exam Vitals and nursing note reviewed.  Constitutional:      Appearance: Normal appearance. He is obese. He is not ill-appearing.     Comments: Sitting in wheelchair  Cardiovascular:     Rate and Rhythm: Normal rate and regular rhythm.     Pulses: Normal pulses.     Heart sounds: Normal  heart sounds. No murmur heard.   Pulmonary:     Effort: Pulmonary effort is normal. No respiratory distress.     Breath sounds: Normal breath sounds. No wheezing, rhonchi or rales.  Abdominal:     General: Bowel sounds are normal. There is distension.     Palpations: Abdomen is soft. There is no mass.     Tenderness: There is no abdominal tenderness.  Musculoskeletal:  Right lower leg: Edema present.     Left lower leg: Edema present.     Comments: R foot: no ligament laxity or discomfort at malleoli. Mild discomfort at base of 5th MT as well a dorsal midfoot at metatarsal shafts. 1+ DP bilaterally.   Skin:    General: Skin is warm and dry.     Findings: No rash.  Neurological:     Mental Status: He is alert.       Results for orders placed or performed in visit on 01/09/20  TSH  Result Value Ref Range   TSH 1.88 0.40 - 4.50 mIU/L  Hemoglobin A1c  Result Value Ref Range   Hgb A1c MFr Bld 5.1 <5.7 % of total Hgb   Mean Plasma Glucose 100 (calc)   eAG (mmol/L) 5.5 (calc)  VITAMIN D 25 Hydroxy (Vit-D Deficiency, Fractures)  Result Value Ref Range   Vit D, 25-Hydroxy 14 (L) 30 - 100 ng/mL   Lab Results  Component Value Date   VITAMINB12 1,097 (H) 03/24/2019    Lab Results  Component Value Date   LABPROT 11.9 12/09/2019    Lab Results  Component Value Date   CREATININE 0.84 12/09/2019   BUN 13 12/09/2019   NA 135 12/09/2019   K 3.9 12/09/2019   CL 103 12/09/2019   CO2 22 12/09/2019    Lab Results  Component Value Date   WBC 3.7 12/09/2019   HGB 11.9 (L) 12/09/2019   HCT 34.7 (L) 12/09/2019   MCV 99 (H) 12/09/2019   PLT 48 (LL) 12/09/2019   Lab Results  Component Value Date   ALT 12 12/09/2019   AST 19 12/09/2019   ALKPHOS 181 (H) 12/09/2019   BILITOT 1.1 12/09/2019    DG Foot Complete Right CLINICAL DATA:  Status post crush injury 2 weeks ago.  EXAM: RIGHT FOOT COMPLETE - 3+ VIEW  COMPARISON:  None.  FINDINGS: There is no evidence of fracture  or dislocation. Mild degenerative changes are seen along the dorsal aspect of the mid right foot. Soft tissues are unremarkable.  IMPRESSION: Mild degenerative changes without evidence of acute osseous abnormality.  Electronically Signed   By: Virgina Norfolk M.D.   On: 01/09/2020 20:36   Assessment & Plan:  This visit occurred during the SARS-CoV-2 public health emergency.  Safety protocols were in place, including screening questions prior to the visit, additional usage of staff PPE, and extensive cleaning of exam room while observing appropriate contact time as indicated for disinfecting solutions.   Problem List Items Addressed This Visit    Vitamin D deficiency    Update labs.       Thrombocytopenia ([place])    Planning Avatrombopag through GI to boost platelets prior to ortho surgery Vicente Males)      Smoker    Has restarted smoking 1 PPD. Encouraged full cessation.       Prediabetes    Update A1c.       Osteoarthritis of right hip    Still awaiting surgery. Planned Avatrombopag use perioperatively for thrombocytopenia. Difficulty with coordination between GI and ortho. I asked patient to provide Dr Jefferson Fuel' office with GI contact information.       Memory deficit    Noted by wife. Start with labs today (TSH, RPR) and consider MMSE next visit.       Relevant Orders   RPR   TSH (Completed)   Hemoglobin A1c (Completed)   VITAMIN D 25 Hydroxy (Vit-D Deficiency, Fractures) (Completed)  MDD (major depressive disorder), recurrent severe, without psychosis ([place])    Continues cymbalta 78m daily - wife somewhat concerned about decreasing effectiveness. No changes made today.       Essential hypertension (Chronic)    Great BP control on current regimen - continue.       Crush injury of right foot, initial encounter - Primary    Overall reassuring exam - anticipate bony contusion. Check R foot xray r/o fracture.       Relevant Orders   DG Foot Complete Right  (Completed)   Cirrhosis of liver with ascites [place]    Appreciate GI care - thought NASH cirrhosis. MELD-Na score: 8 at 12/09/2019  4:13 PM MELD score: 8 at 12/09/2019  4:13 PM Calculated from: Serum Creatinine: 0.84 mg/dL (Using min of 1 mg/dL) at 12/09/2019  4:13 PM Serum Sodium: 135 mmol/L at 12/09/2019  4:13 PM Total Bilirubin: 1.1 mg/dL at 12/09/2019  4:13 PM INR(ratio): 1.1 at 12/09/2019  4:13 PM Age: [age]years        Chronic pain syndrome    Pain regimen has recently changed - to fentanyl + MSIR.       Relevant Medications   gabapentin (NEURONTIN) 100 MG capsule   Abdominal aortic aneurysm (AAA) without rupture ([facility]    S/p endovascular repair 2017 - saw [facility] this month for routine yearly f/u - stable report.           No orders of the defined types were placed in this encounter.  Orders Placed This Encounter  Procedures  . DG Foot Complete Right    Standing Status:   Future    Number of Occurrences:   1    Standing Expiration Date:   01/08/2021    Order Specific Question:   Reason for Exam (SYMPTOM  OR DIAGNOSIS REQUIRED)    Answer:   crush injury 2 wks ago    Order Specific Question:   Preferred imaging location?    Answer:   [facility]   Order Specific Question:   Radiology Contrast Protocol - do NOT remove file path    Answer:   [email or web address]  . RPR  . TSH  . Hemoglobin A1c  . VITAMIN D 25 Hydroxy (Vit-D Deficiency, Fractures)    Patient Instructions  R foot xray today  For memory - check labwork today.  Give Dr SJefferson Fuelcontact information for Dr AVicente Males  Good to see you today. Return as needed or in 3 months for follow up visit.    Follow up plan: Return in about 3 months (around 04/10/2020), or if symptoms worsen or fail to improve, for follow up visit.  JRia Bush MD

## 2020-01-10 DIAGNOSIS — S9781XA Crushing injury of right foot, initial encounter: Secondary | ICD-10-CM | POA: Insufficient documentation

## 2020-01-10 DIAGNOSIS — R413 Other amnesia: Secondary | ICD-10-CM | POA: Insufficient documentation

## 2020-01-10 NOTE — Assessment & Plan Note (Signed)
Has restarted smoking 1 PPD. Encouraged full cessation.

## 2020-01-10 NOTE — Assessment & Plan Note (Signed)
Pain regimen has recently changed - to fentanyl + MSIR.

## 2020-01-10 NOTE — Assessment & Plan Note (Addendum)
Appreciate GI care - thought NASH cirrhosis. MELD-Na score: 8 at 12/09/2019  4:13 PM MELD score: 8 at 12/09/2019  4:13 PM Calculated from: Serum Creatinine: 0.84 mg/dL (Using min of 1 mg/dL) at 12/09/2019  4:13 PM Serum Sodium: 135 mmol/L at 12/09/2019  4:13 PM Total Bilirubin: 1.1 mg/dL at 12/09/2019  4:13 PM INR(ratio): 1.1 at 12/09/2019  4:13 PM Age: 64 years

## 2020-01-10 NOTE — Assessment & Plan Note (Addendum)
Still awaiting surgery. Planned Avatrombopag use perioperatively for thrombocytopenia. Difficulty with coordination between GI and ortho. I asked patient to provide Dr Jefferson Fuel' office with GI contact information.

## 2020-01-10 NOTE — Assessment & Plan Note (Addendum)
S/p endovascular repair 2017 - saw [facility] this month for routine yearly f/u - stable report.

## 2020-01-10 NOTE — Assessment & Plan Note (Signed)
Update A1c.

## 2020-01-10 NOTE — Assessment & Plan Note (Signed)
Great BP control on current regimen - continue.

## 2020-01-10 NOTE — Assessment & Plan Note (Signed)
Continues cymbalta 51m daily - wife somewhat concerned about decreasing effectiveness. No changes made today.

## 2020-01-10 NOTE — Assessment & Plan Note (Signed)
Overall reassuring exam - anticipate bony contusion. Check R foot xray r/o fracture.

## 2020-01-10 NOTE — Assessment & Plan Note (Signed)
Noted by wife. Start with labs today (TSH, RPR) and consider MMSE next visit.

## 2020-01-10 NOTE — Assessment & Plan Note (Signed)
Planning Avatrombopag through GI to boost platelets prior to ortho surgery Vicente Males)

## 2020-01-10 NOTE — Assessment & Plan Note (Signed)
Update labs.  

## 2020-01-12 ENCOUNTER — Telehealth: Payer: Self-pay

## 2020-01-12 DIAGNOSIS — R296 Repeated falls: Secondary | ICD-10-CM | POA: Diagnosis not present

## 2020-01-12 DIAGNOSIS — G600 Hereditary motor and sensory neuropathy: Secondary | ICD-10-CM | POA: Diagnosis not present

## 2020-01-12 DIAGNOSIS — R069 Unspecified abnormalities of breathing: Secondary | ICD-10-CM | POA: Diagnosis not present

## 2020-01-12 DIAGNOSIS — M1611 Unilateral primary osteoarthritis, right hip: Secondary | ICD-10-CM | POA: Diagnosis not present

## 2020-01-12 LAB — TSH: TSH: 1.88 mIU/L (ref 0.40–4.50)

## 2020-01-12 LAB — HEMOGLOBIN A1C
Hgb A1c MFr Bld: 5.1 % of total Hgb (ref <5.7)
Mean Plasma Glucose: 100 (calc)
eAG (mmol/L): 5.5 (calc)

## 2020-01-12 LAB — VITAMIN D 25 HYDROXY (VIT D DEFICIENCY, FRACTURES): Vit D, 25-Hydroxy: 14 ng/mL — ABNORMAL LOW (ref 30–100)

## 2020-01-12 LAB — RPR: RPR Ser Ql: NONREACTIVE

## 2020-01-12 NOTE — Telephone Encounter (Addendum)
Left message on vm per dpr relaying results Dr. Synthia Innocent message.  Asked pt to call back letting us know if he is taking vit D3 2000 IU daily or not.  Labs & Dr. Synthia Innocent msg: thyroid levels were normal. A1c was in normal range - no diabetes or prediabetes. Vit D levels very low - is he taking 2000 IU daily? Recommend start if not taking, if already taking, rec start Rx 50k units weekly - and may send in vit D 50,000 IU weekly #12 RF#3.   X-ray: Your right foot xray returned showing mild arthritis changes but reassuringly no fracture.

## 2020-01-13 NOTE — Telephone Encounter (Signed)
Left message on vm per dpr relaying results Dr. Synthia Innocent message.  Asked pt to call back letting us know if he is taking vit D3 2000 IU daily or not.  Labs & Dr. Synthia Innocent msg: thyroid levels were normal. A1c was in normal range - no diabetes or prediabetes. Vit D levels very low - is he taking 2000 IU daily? Recommend start if not taking, if already taking, rec start Rx 50k units weekly - and may send in vit D 50,000 IU weekly #12 RF#3.   X-ray: Your right foot xray returned showing mild arthritis changes but reassuringly no fracture.

## 2020-01-14 NOTE — Telephone Encounter (Addendum)
Left message on vm per dpr relaying results Dr. Synthia Innocent message.  Asked pt to call back letting us know if he is taking vit D3 2000 IU daily or not.  Mailing a letter.   Labs & Dr. Synthia Innocent msg: thyroid levels were normal. A1c was in normal range - no diabetes or prediabetes. Vit D levels very low - is he taking 2000 IU daily? Recommend start if not taking, if already taking, rec start Rx 50k units weekly - and may send in vit D 50,000 IU weekly #12 RF#3.   X-ray: Your right foot xray returned showing mild arthritis changes but reassuringly no fracture.

## 2020-01-24 DIAGNOSIS — G600 Hereditary motor and sensory neuropathy: Secondary | ICD-10-CM | POA: Diagnosis not present

## 2020-02-03 DIAGNOSIS — Z79891 Long term (current) use of opiate analgesic: Secondary | ICD-10-CM | POA: Diagnosis not present

## 2020-02-03 DIAGNOSIS — G894 Chronic pain syndrome: Secondary | ICD-10-CM | POA: Diagnosis not present

## 2020-02-03 DIAGNOSIS — M25511 Pain in right shoulder: Secondary | ICD-10-CM | POA: Diagnosis not present

## 2020-02-03 DIAGNOSIS — M542 Cervicalgia: Secondary | ICD-10-CM | POA: Diagnosis not present

## 2020-02-03 DIAGNOSIS — M25551 Pain in right hip: Secondary | ICD-10-CM | POA: Diagnosis not present

## 2020-02-03 DIAGNOSIS — G600 Hereditary motor and sensory neuropathy: Secondary | ICD-10-CM | POA: Diagnosis not present

## 2020-02-03 DIAGNOSIS — M545 Low back pain: Secondary | ICD-10-CM | POA: Diagnosis not present

## 2020-02-11 DIAGNOSIS — Z79899 Other long term (current) drug therapy: Secondary | ICD-10-CM | POA: Diagnosis not present

## 2020-02-11 DIAGNOSIS — Z79891 Long term (current) use of opiate analgesic: Secondary | ICD-10-CM | POA: Diagnosis not present

## 2020-02-12 DIAGNOSIS — R069 Unspecified abnormalities of breathing: Secondary | ICD-10-CM | POA: Diagnosis not present

## 2020-02-12 DIAGNOSIS — M1611 Unilateral primary osteoarthritis, right hip: Secondary | ICD-10-CM | POA: Diagnosis not present

## 2020-02-12 DIAGNOSIS — G600 Hereditary motor and sensory neuropathy: Secondary | ICD-10-CM | POA: Diagnosis not present

## 2020-02-12 DIAGNOSIS — R296 Repeated falls: Secondary | ICD-10-CM | POA: Diagnosis not present

## 2020-02-19 ENCOUNTER — Other Ambulatory Visit: Payer: Self-pay | Admitting: Family Medicine

## 2020-02-19 NOTE — Telephone Encounter (Signed)
Nystatin cream Last filled:  02/05/19, #75 g Last OV:  01/09/20, f/u Next OV:  none

## 2020-02-24 DIAGNOSIS — G600 Hereditary motor and sensory neuropathy: Secondary | ICD-10-CM | POA: Diagnosis not present

## 2020-03-02 DIAGNOSIS — G600 Hereditary motor and sensory neuropathy: Secondary | ICD-10-CM | POA: Diagnosis not present

## 2020-03-02 DIAGNOSIS — M25511 Pain in right shoulder: Secondary | ICD-10-CM | POA: Diagnosis not present

## 2020-03-02 DIAGNOSIS — Z79891 Long term (current) use of opiate analgesic: Secondary | ICD-10-CM | POA: Diagnosis not present

## 2020-03-02 DIAGNOSIS — M542 Cervicalgia: Secondary | ICD-10-CM | POA: Diagnosis not present

## 2020-03-02 DIAGNOSIS — M545 Low back pain: Secondary | ICD-10-CM | POA: Diagnosis not present

## 2020-03-02 DIAGNOSIS — M25551 Pain in right hip: Secondary | ICD-10-CM | POA: Diagnosis not present

## 2020-03-02 DIAGNOSIS — G894 Chronic pain syndrome: Secondary | ICD-10-CM | POA: Diagnosis not present

## 2020-03-08 NOTE — Progress Notes (Signed)
HPI: FU coronary artery disease, abdominal aortic aneurysm, hypertension, DM, hyperlipidemia,COPD, cirrhosis, tobacco abuse. He has had prior inferior infarct with PCI of his right coronary artery and PDA. His most recent cardiac catheterization was performed on March 30, 2005. The patient had an ejection fraction of 40% with inferior akinesis at that time. However, his stents in the right coronary artery were widely patent. Carotid Dopplers April 2016 showed no significant stenosis. CTA July 2019 showed prior endovascular repair of suprarenal abdominal aortic aneurysm without complication. Renal artery stents were widely patent. There was note of hepatic cirrhosis and portal venous hypertension with varices around the gastroesophageal junction as well as splenomegaly. Echocardiogram May 2020 showed ejection fraction 35 to 40%, moderate left ventricular enlargement, mild left ventricular hypertrophy, mild right ventricular enlargement. Nuclear study December 2020 showed ejection fraction 43%, prior inferior/inferoseptal infarct with insignificant peri-infarct ischemia.  Abdominal ultrasound August 2021 showed patent endovascular aneurysm repair with no leak.  Since last seen patient denies dyspnea, palpitations or syncope.  He has occasional brief chest pain that is chronic and unchanged.  Current Outpatient Medications  Medication Sig Dispense Refill  . albuterol (PROVENTIL HFA;VENTOLIN HFA) 108 (90 Base) MCG/ACT inhaler Inhale 2 puffs into the lungs every 6 (six) hours as needed for wheezing or shortness of breath. 1 Inhaler 0  . albuterol (PROVENTIL) (2.5 MG/3ML) 0.083% nebulizer solution USE 1 VIAL PER NEBULIZER EVERY 6 HRS AS NEEDED FOR WHEEZING 75 mL 6  . aspirin 81 MG tablet Take 1 tablet (81 mg total) by mouth daily.    . B Complex-C (SUPER B COMPLEX PO) Take 1 tablet by mouth daily.    . Ca Phosphate-Cholecalciferol [phone] MG-UNIT TABS Take 1 tablet by mouth daily.     .  cholecalciferol 2000 units TABS Take 2,000 Units by mouth daily.    . Cobalamin Combinations (B-12) 1000-400 MCG SUBL Take 1 tablet by mouth daily.     [name] dicyclomine (BENTYL) 20 MG tablet TAKE 1 TABLET BY MOUTH 3 TIMES DAILY BEFORE MEALS. AS NEEDED FOR ABDOMINAL PAIN 30 tablet 1  . DULoxetine (CYMBALTA) 60 MG capsule TAKE 1 CAPSULE BY MOUTH EVERY DAY 90 capsule 0  . fentaNYL (DURAGESIC - DOSED MCG/HR) 50 MCG/HR Place 1 patch (50 mcg total) every other day onto the skin. (Patient taking differently: Place 75 mcg onto the skin every other day. ) 15 patch 0  . fentaNYL (DURAGESIC) 75 MCG/HR     . ferrous sulfate 325 (65 FE) MG tablet Take 1 tablet (325 mg total) by mouth every other day.    . fluticasone (FLONASE) 50 MCG/ACT nasal spray Place 2 sprays into both nostrils daily as needed for allergies.     . folic acid (FOLVITE) 1 MG tablet TAKE 1 TABLET BY MOUTH EVERY DAY 90 tablet 3  . furosemide (LASIX) 40 MG tablet TAKE 1 TABLET BY MOUTH EVERY DAY 90 tablet 1  . irbesartan (AVAPRO) 75 MG tablet Take 1 tablet (75 mg total) by mouth daily. 90 tablet 3  . Lactulose 20 GM/30ML SOLN Take 30 mLs (20 g total) by mouth 3 (three) times daily. 2700 mL 5  . methocarbamol (ROBAXIN) 500 MG tablet TAKE 1 TABLET (500 MG TOTAL) BY MOUTH AT BEDTIME. 30 tablet 0  . metoprolol succinate (TOPROL-XL) 25 MG 24 hr tablet TAKE 1/2 TABLET BY MOUTH EVERY DAY 45 tablet 0  . Misc Natural Products (TART CHERRY ADVANCED) CAPS Take 1 capsule by mouth 2 (two) times daily.    [name]  nitroGLYCERIN (NITROSTAT) 0.4 MG SL tablet Place 1 tablet (0.4 mg total) under the tongue every 5 (five) minutes as needed for chest pain. 25 tablet 12  . nystatin cream (MYCOSTATIN) APPLY TO AFFECTED AREA TWICE A DAY 75 g 0  . omeprazole (PRILOSEC) 40 MG capsule TAKE 1 CAPSULE BY MOUTH EVERY DAY 90 capsule 3  . ondansetron (ZOFRAN) 4 MG tablet Take 1 tablet (4 mg total) by mouth every 8 (eight) hours as needed for nausea or vomiting. 30 tablet 1  .  Oxycodone HCl 10 MG TABS     . Red Yeast Rice 600 MG CAPS Take 2 capsules by mouth daily.    [name] spironolactone (ALDACTONE) 100 MG tablet TAKE 1 TABLET BY MOUTH EVERY DAY 90 tablet 1  . vitamin B-12 (CYANOCOBALAMIN) 500 MCG tablet Take 1 tablet (500 mcg total) by mouth every Monday, Wednesday, and Friday.     No current facility-administered medications for this visit.     Past Medical History:  Diagnosis Date  . AAA (abdominal aortic aneurysm) ([place]) 09/2012--  monitored by dr Trula Slade   stable 5.6cm CTA abdomen 2016  . Abnormal drug screen 07/09/2016   1/2/018 - positive oxycodone, fentanyl, inapprop positive MJ - mod risk  . Allergic rhinitis   . Ascites 03/2019  . B12 deficiency   . CAD (coronary artery disease) cardiologist-  dr Stanford Breed   x3 with stents last 2005, EF 40%, predominantly RCA by CT 2016  . Cataracts, bilateral   . Cervical spondylosis 05/2010   s/p surgery  . Charcot Marie Tooth muscular atrophy dx  [phone]   neurologist--  dr love--  type 2 per pt  . Chronic pain syndrome    established with [facility] ([place]) --> disagreement and transfered care to Dr Sanjuan Dame at [facility] clinic [facility], requests PCP write Rx but f/u with pain clinic Q6-12 months  . COPD (chronic obstructive pulmonary disease) ([place]) 10/2011   minimal by PFTs  . DDD (degenerative disc disease)   . Disturbances of sensation of smell and taste    improving  . Dyspnea on exertion   . GERD (gastroesophageal reflux disease)   . Gout   . Headache   . Hepatitis    hepatitis B  . Hidradenitis    right groin  . Hidradenitis suppurativa dx 2011   goin and leg crease   followd by Lyndle Herrlich - daily bactrim, s/p intralesional steroid injection 10/2010  . Hip osteoarthritis    s/p intraarticular steroid shot (12/2012) ([place])  . History of hepatitis B 1983  . History of MI (myocardial infarction)    2000  &  2005  . History of pneumonia   . History of viral meningitis 2000  . HLD  (hyperlipidemia)   . HTN (hypertension)   . Ischemic cardiomyopathy    s/p inferior MI  --  current ef per myoview 39%  . Liver cirrhosis secondary to NASH ([place]) 01/2014   by CT scan, rec virtual colonoscopy by Dr Collene Mares 06/2014  . Lumbar herniated disc   . Myocardial infarction ([place])    x2  . Nocturia more than twice per night   . Obesity   . Spinal stenosis    released from [place].  established with preferred pain (07/2013)  . T2DM (type 2 diabetes mellitus) ([place])    ABIs WNL 2016  . Vitamin D deficiency     Past Surgical History:  Procedure Laterality Date  . ABDOMINAL AORTIC ENDOVASCULAR FENESTRATED STENT GRAFT  N/A 11/30/2015   Procedure: ABDOMINAL AORTIC ENDOVASCULAR FENESTRATED STENT GRAFT;  Surgeon: Serafina Mitchell, MD;  Location: [place];  Service: Vascular;  Laterality: N/A;  . ANTERIOR CERVICAL DECOMP/DISCECTOMY FUSION  01-07-2010    C4 -- C7  . CARDIAC CATHETERIZATION  03-30-2005  dr Albertine Patricia   ef 40% w/ inferior akinesis/  LM and CFX angiographically normal/  pLAD 30%/   Widely patent stents in RCA and PDA widely patent  . CARDIOVASCULAR STRESS TEST  10-23-2012  dr Stanford Breed   No ischemia/  Moderate scar in the inferior wall, otherwise normal perfusion/  LV ef 39%,  LV wall motion: inferior/ inferolateral hypokinesis  . COLONOSCOPY  05/06/2007   normal, small int hemorrhoids rpt 5 yrs due to fmhx - rec against rpt colonoscopy by Dr Collene Mares  . CORONARY ANGIOPLASTY  2000  dr Stanford Breed   PCI to RCA and PDA  . CORONARY ANGIOPLASTY WITH STENT PLACEMENT  03-19-2005  dr Gwyndolyn Saxon downey   inferior STEMI--- DES x4 to RCA w/ balloon angioplasty and balloon angioplasty to jailed PDA ostium/  severe hypokinesis of midinferor wall, ef 50%/  dLM 20%,  mLAD 20%,  dCFX 60%  . ESOPHAGOGASTRODUODENOSCOPY  01/2017   dilated benign esophageal stenosis, portal hypertensive gastropathy Henrene Pastor)  . ESOPHAGOGASTRODUODENOSCOPY (EGD) WITH PROPOFOL N/A 02/20/2018   benign biopsy [name], MD)  .  ESOPHAGOGASTRODUODENOSCOPY (EGD) WITH PROPOFOL N/A 05/12/2019   Procedure: ESOPHAGOGASTRODUODENOSCOPY (EGD) WITH PROPOFOL;  Surgeon: [name], MD;  Location: [facility] ENDOSCOPY;  Service: Gastroenterology;  Laterality: N/A;  . HYDRADENITIS EXCISION Right 12/31/2014   Procedure: WIDE EXCISION HIDRADENITIS GROIN; Coralie Keens, MD  . IR PARACENTESIS  03/25/2019  . LUMBAR DISC SURGERY     L5-S1  . LUMBAR LAMINECTOMY  05-18-2010   L2--5   laminectomy/foraminotomy for stenosis Joya Salm)  . MYELOGRAM     L5-S1 and L1-2 spondylosis  . SACROILIAC JOINT INJECTION Bilateral 10/2013   [place]  . TONSILLECTOMY AND ADENOIDECTOMY  1972    Social History   Socioeconomic History  . Marital status: Married    Spouse name: [name]  . Number of children: 0  . Years of education: College  . Highest education level: Not on file  Occupational History  . Occupation: Neurosurgeon implants, crown and bridge-now disability 2006    Employer: UNEMPLOYED  Tobacco Use  . Smoking status: Former Smoker    Packs/day: 1.00    Years: 57.00    Pack years: 57.00    Types: Cigarettes, E-cigarettes    Start date: 06/06/1967    Quit date: 07/21/2019    Years since quitting: 0.6  . Smokeless tobacco: Never Used  . Tobacco comment: stopped smoking a pipe in 2015  DOES SMOKE E CIG  Vaping Use  . Vaping Use: Former  Substance and Sexual Activity  . Alcohol use: No    Alcohol/week: 0.0 standard drinks  . Drug use: Yes    Types: Fentanyl, Hydrocodone  . Sexual activity: Not on file  Other Topics Concern  . Not on file  Social History Narrative   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 x 5 years   caffeine: 2 cups coffee, 2 cups soda   Occupation: Neurosurgeon implants, crowne and bridge, now disability 2006   Lives with wife, 1 dog, no children   Social Determinants of Health   Financial Resource Strain:   . Difficulty of Paying Living Expenses: Not on file  Food Insecurity:   . Worried About Paediatric nurse in the Last  Year: Not on file  . Ran Out of Food in the Last Year: Not on file  Transportation Needs:   . Lack of Transportation (Medical): Not on file  . Lack of Transportation (Non-Medical): Not on file  Physical Activity:   . Days of Exercise per Week: Not on file  . Minutes of Exercise per Session: Not on file  Stress:   . Feeling of Stress : Not on file  Social Connections:   . Frequency of Communication with Friends and Family: Not on file  . Frequency of Social Gatherings with Friends and Family: Not on file  . Attends Religious Services: Not on file  . Active Member of Clubs or Organizations: Not on file  . Attends Archivist Meetings: Not on file  . Marital Status: Not on file  Intimate Partner Violence:   . Fear of Current or Ex-Partner: Not on file  . Emotionally Abused: Not on file  . Physically Abused: Not on file  . Sexually Abused: Not on file    Family History  Problem Relation Age of Onset  . Cancer Mother        colon  . Diabetes Mother   . Kidney disease Mother   . Aneurysm Mother        AAA  . Rheum arthritis Mother   . Charcot-Marie-Tooth disease Mother   . Heart disease Mother        before age 48  . Cancer Father        skin  . Heart attack Father   . Heart disease Father        before age 43  . Cancer Brother        skin  . Coronary artery disease Brother   . Cancer Brother        small cell lung cancer  . Aneurysm Brother        AAA  . Rheum arthritis Sister   . Rheum arthritis Brother   . Prostate cancer Neg Hx   . Bladder Cancer Neg Hx   . Kidney cancer Neg Hx     ROS: no fevers or chills, productive cough, hemoptysis, dysphasia, odynophagia, melena, hematochezia, dysuria, hematuria, rash, seizure activity, orthopnea, PND, pedal edema, claudication. Remaining systems are negative.  Physical Exam: Well-developed obese in no acute distress.  Skin is warm and dry.  HEENT is normal.  Neck is supple.  Chest is  clear to auscultation with normal expansion.  Cardiovascular exam is regular rate and rhythm.  Abdominal exam nontender or distended. No masses palpated. Extremities show no edema. neuro grossly intact  ECG-normal sinus rhythm with PVCs, inferior infarct, nonspecific T wave changes.  Personally reviewed  A/P  1 coronary artery disease-patient has not had recurrent exertional chest pain.  Continue medical therapy with aspirin.  He is intolerant to statins.  2 ischemic cardiomyopathy-continue ARB and beta-blocker.  3 hypertension-patient's blood pressure is controlled.  Continue present medical regimen.  Check potassium and renal function.  4 hyperlipidemia-patient is intolerant to statins.  Add Zetia 10 mg daily.  Check lipids and liver in 12 weeks.  5 status post abdominal aortic aneurysm repair-followed by vascular surgery.  Most recent ultrasound showed stable repair.  6 tobacco abuse-patient counseled on discontinuing.  7 cirrhosis-managed by gastroenterology.  Kirk Ruths, MD

## 2020-03-10 ENCOUNTER — Ambulatory Visit: Payer: Medicare HMO | Admitting: Gastroenterology

## 2020-03-18 ENCOUNTER — Ambulatory Visit: Payer: Medicare HMO | Admitting: Cardiology

## 2020-03-18 ENCOUNTER — Other Ambulatory Visit: Payer: Self-pay

## 2020-03-18 ENCOUNTER — Encounter: Payer: Self-pay | Admitting: Cardiology

## 2020-03-18 VITALS — BP 116/70 | HR 75 | Ht 69.0 in | Wt 277.2 lb

## 2020-03-18 DIAGNOSIS — E7800 Pure hypercholesterolemia, unspecified: Secondary | ICD-10-CM

## 2020-03-18 DIAGNOSIS — I2510 Atherosclerotic heart disease of native coronary artery without angina pectoris: Secondary | ICD-10-CM

## 2020-03-18 DIAGNOSIS — I10 Essential (primary) hypertension: Secondary | ICD-10-CM | POA: Diagnosis not present

## 2020-03-18 DIAGNOSIS — I714 Abdominal aortic aneurysm, without rupture, unspecified: Secondary | ICD-10-CM

## 2020-03-18 MED ORDER — EZETIMIBE 10 MG PO TABS: 10.0000 mg | ORAL_TABLET | Freq: Every day | ORAL | 3 refills | Status: DC

## 2020-03-18 NOTE — Patient Instructions (Signed)
Medication Instructions:   START EZETIMIBE 10 MG ONCE DAILY  *If you need a refill on your cardiac medications before your next appointment, please call your pharmacy*   Lab Work:  Your physician recommends that you return for lab work in: [place]  If you have labs (blood work) drawn today and your tests are completely normal, you will receive your results only by: [name] MyChart Message (if you have MyChart) OR . A paper copy in the mail If you have any lab test that is abnormal or we need to change your treatment, we will call you to review the results.   Follow-Up: At [facility], you and your health needs are our priority.  As part of our continuing mission to provide you with exceptional heart care, we have created designated Provider Care Teams.  These Care Teams include your primary Cardiologist (physician) and Advanced Practice Providers (APPs -  Physician Assistants and Nurse Practitioners) who all work together to provide you with the care you need, when you need it.  We recommend signing up for the patient portal called "MyChart".  Sign up information is provided on this After Visit Summary.  MyChart is used to connect with patients for Virtual Visits (Telemedicine).  Patients are able to view lab/test results, encounter notes, upcoming appointments, etc.  Non-urgent messages can be sent to your provider as well.   To learn more about what you can do with MyChart, go to [email or web address].    Your next appointment:   12 month(s)  The format for your next appointment:   In Person  Provider:   Kirk Ruths, MD

## 2020-03-25 DIAGNOSIS — G600 Hereditary motor and sensory neuropathy: Secondary | ICD-10-CM | POA: Diagnosis not present

## 2020-03-26 DIAGNOSIS — G600 Hereditary motor and sensory neuropathy: Secondary | ICD-10-CM | POA: Diagnosis not present

## 2020-03-26 DIAGNOSIS — R069 Unspecified abnormalities of breathing: Secondary | ICD-10-CM | POA: Diagnosis not present

## 2020-03-26 DIAGNOSIS — R296 Repeated falls: Secondary | ICD-10-CM | POA: Diagnosis not present

## 2020-03-26 DIAGNOSIS — M1611 Unilateral primary osteoarthritis, right hip: Secondary | ICD-10-CM | POA: Diagnosis not present

## 2020-03-30 DIAGNOSIS — G894 Chronic pain syndrome: Secondary | ICD-10-CM | POA: Diagnosis not present

## 2020-03-30 DIAGNOSIS — Z79891 Long term (current) use of opiate analgesic: Secondary | ICD-10-CM | POA: Diagnosis not present

## 2020-03-30 DIAGNOSIS — M25511 Pain in right shoulder: Secondary | ICD-10-CM | POA: Diagnosis not present

## 2020-03-30 DIAGNOSIS — M542 Cervicalgia: Secondary | ICD-10-CM | POA: Diagnosis not present

## 2020-03-30 DIAGNOSIS — G600 Hereditary motor and sensory neuropathy: Secondary | ICD-10-CM | POA: Diagnosis not present

## 2020-03-30 DIAGNOSIS — M25551 Pain in right hip: Secondary | ICD-10-CM | POA: Diagnosis not present

## 2020-04-12 DIAGNOSIS — L89309 Pressure ulcer of unspecified buttock, unspecified stage: Secondary | ICD-10-CM | POA: Diagnosis not present

## 2020-04-25 DIAGNOSIS — G600 Hereditary motor and sensory neuropathy: Secondary | ICD-10-CM | POA: Diagnosis not present

## 2020-04-26 ENCOUNTER — Other Ambulatory Visit: Payer: Self-pay

## 2020-04-26 ENCOUNTER — Encounter (HOSPITAL_BASED_OUTPATIENT_CLINIC_OR_DEPARTMENT_OTHER): Payer: Medicare HMO | Attending: Physician Assistant | Admitting: Physician Assistant

## 2020-04-26 DIAGNOSIS — I714 Abdominal aortic aneurysm, without rupture: Secondary | ICD-10-CM | POA: Diagnosis not present

## 2020-04-26 DIAGNOSIS — Z888 Allergy status to other drugs, medicaments and biological substances status: Secondary | ICD-10-CM | POA: Diagnosis not present

## 2020-04-26 DIAGNOSIS — L89320 Pressure ulcer of left buttock, unstageable: Secondary | ICD-10-CM | POA: Diagnosis not present

## 2020-04-26 DIAGNOSIS — L89313 Pressure ulcer of right buttock, stage 3: Secondary | ICD-10-CM | POA: Diagnosis not present

## 2020-04-26 DIAGNOSIS — J449 Chronic obstructive pulmonary disease, unspecified: Secondary | ICD-10-CM | POA: Diagnosis not present

## 2020-04-26 DIAGNOSIS — I5042 Chronic combined systolic (congestive) and diastolic (congestive) heart failure: Secondary | ICD-10-CM | POA: Insufficient documentation

## 2020-04-26 DIAGNOSIS — I739 Peripheral vascular disease, unspecified: Secondary | ICD-10-CM | POA: Diagnosis not present

## 2020-04-26 DIAGNOSIS — F17210 Nicotine dependence, cigarettes, uncomplicated: Secondary | ICD-10-CM | POA: Insufficient documentation

## 2020-04-26 DIAGNOSIS — Z885 Allergy status to narcotic agent status: Secondary | ICD-10-CM | POA: Diagnosis not present

## 2020-04-26 NOTE — Progress Notes (Signed)
[name] ([id number]) , Visit Report for 04/26/2020 Abuse/Suicide Risk Screen Details Patient Name: Date of Service: [name], [name]. 04/26/2020 2:45 PM Medical Record Number: [id number] Patient Account Number: [id number] Date of Birth/Sex: Treating RN: 13-May-1956 (64 y.o. [name] Primary Care Arla Boutwell: Ria Bush Other Clinician: Referring Elajah Kunsman: Treating Verdell Kincannon/Extender: Gwenith Daily Weeks in Treatment: 0 Abuse/Suicide Risk Screen Items Answer ABUSE RISK SCREEN: Has anyone close to you tried to hurt or harm you recentlyo No Do you feel uncomfortable with anyone in your familyo No Has anyone forced you do things that you didnt want to doo No Electronic Signature(s) Signed: 04/26/2020 4:59:01 PM By: Baruch Gouty RN, BSN Entered By: Baruch Gouty on 04/26/2020 15:59:22 -------------------------------------------------------------------------------- Activities of Daily Living Details Patient Name: Date of Service: [name], [name]. 04/26/2020 2:45 PM Medical Record Number: [id number] Patient Account Number: [id number] Date of Birth/Sex: Treating RN: 06/13/1955 (64 y.o. [name] Primary Care Calayah Guadarrama: Ria Bush Other Clinician: Referring Anberlin Diez: Treating Hamdi Vari/Extender: Gwenith Daily Weeks in Treatment: 0 Activities of Daily Living Items Answer Activities of Daily Living (Please select one for each item) Drive Automobile Completely Able T Medications ake Need Assistance Use T elephone Completely Able Care for Appearance Need Assistance Use T oilet Need Assistance Bath / Shower Need Assistance Dress Self Need Assistance Feed Self Completely Able Walk Need Assistance Get In / Out Bed Need Assistance Housework Not Able Prepare Meals Need Assistance Handle Money Completely Able Shop for Self Need Assistance Electronic Signature(s) Signed: 04/26/2020 4:59:01 PM  By: Baruch Gouty RN, BSN Entered By: Baruch Gouty on 04/26/2020 16:00:22 -------------------------------------------------------------------------------- Education Screening Details Patient Name: Date of Service: [name], [name]. 04/26/2020 2:45 PM Medical Record Number: [id number] Patient Account Number: [id number] Date of Birth/Sex: Treating RN: 11/21/1955 (64 y.o. [name] Primary Care Janijah Symons: Ria Bush Other Clinician: Referring Arienna Benegas: Treating Vannary Greening/Extender: Estrella Deeds in Treatment: 0 Primary Learner Assessed: Patient Learning Preferences/Education Level/Primary Language Learning Preference: Explanation, Demonstration, Printed Material Highest Education Level: College or Above Preferred Language: English Cognitive Barrier Language Barrier: No Translator Needed: No Memory Deficit: No Emotional Barrier: No Cultural/Religious Beliefs Affecting Medical Care: No Physical Barrier Impaired Vision: Yes Glasses Impaired Hearing: No Decreased Hand dexterity: No Knowledge/Comprehension Knowledge Level: High Comprehension Level: High Ability to understand written instructions: High Ability to understand verbal instructions: High Motivation Anxiety Level: Calm Cooperation: Cooperative Education Importance: Acknowledges Need Interest in Health Problems: Asks Questions Perception: Coherent Willingness to Engage in Self-Management High Activities: Readiness to Engage in Self-Management High Activities: Electronic Signature(s) Signed: 04/26/2020 4:59:01 PM By: Baruch Gouty RN, BSN Entered By: Baruch Gouty on 04/26/2020 16:00:53 -------------------------------------------------------------------------------- Fall Risk Assessment Details Patient Name: Date of Service: [name], [name]. 04/26/2020 2:45 PM Medical Record Number: [id number] Patient Account Number: [id number] Date of  Birth/Sex: Treating RN: December 05, 1955 (64 y.o. [name] Primary Care Tziporah Knoke: Ria Bush Other Clinician: Referring Rivers Gassmann: Treating Laiklynn Raczynski/Extender: Estrella Deeds in Treatment: 0 Fall Risk Assessment Items Have you had 2 or more falls in the last 12 monthso 0 Yes Have you had any fall that resulted in injury in the last 12 monthso 0 No FALLS RISK SCREEN History of falling - immediate or within 3 months 25 Yes Secondary diagnosis (Do you have 2 or more medical diagnoseso) 0 No Ambulatory aid None/bed rest/wheelchair/nurse 0 No Crutches/cane/walker 15 Yes Furniture 0 No Intravenous therapy  Access/Saline/Heparin Lock 0 No Gait/Transferring Normal/ bed rest/ wheelchair 0 No Weak (short steps with or without shuffle, stooped but able to lift head while walking, may seek 10 Yes support from furniture) Impaired (short steps with shuffle, may have difficulty arising from chair, head down, impaired 0 No balance) Mental Status Oriented to own ability 0 Yes Electronic Signature(s) Signed: 04/26/2020 4:59:01 PM By: Baruch Gouty RN, BSN Entered By: Baruch Gouty on 04/26/2020 16:01:28 -------------------------------------------------------------------------------- Foot Assessment Details Patient Name: Date of Service: [name], [name]. 04/26/2020 2:45 PM Medical Record Number: [id number] Patient Account Number: [id number] Date of Birth/Sex: Treating RN: 23-Dec-1955 (64 y.o. [name] Primary Care Elzada Pytel: Ria Bush Other Clinician: Referring Strider Vallance: Treating Rollie Hynek/Extender: Gwenith Daily Weeks in Treatment: 0 Foot Assessment Items Site Locations + = Sensation present, - = Sensation absent, C = Callus, U = Ulcer R = Redness, W = Warmth, M = Maceration, PU = Pre-ulcerative lesion F = Fissure, S = Swelling, Ware = Dryness Assessment Right: Left: Other Deformity: No No Prior Foot Ulcer:  No No Prior Amputation: No No Charcot Joint: No No Ambulatory Status: Ambulatory With Help Assistance Device: Walker Gait: Administrator, arts) Signed: 04/26/2020 4:59:01 PM By: Baruch Gouty RN, BSN Entered By: Baruch Gouty on 04/26/2020 16:02:13 -------------------------------------------------------------------------------- Nutrition Risk Screening Details Patient Name: Date of Service: [name], [name]. 04/26/2020 2:45 PM Medical Record Number: [id number] Patient Account Number: [id number] Date of Birth/Sex: Treating RN: 07-24-1955 (64 y.o. [name] Primary Care Harshika Mago: Ria Bush Other Clinician: Referring Marai Teehan: Treating Ory Elting/Extender: Gwenith Daily Weeks in Treatment: 0 Height (in): 69 Weight (lbs): 277 Body Mass Index (BMI): 40.9 Nutrition Risk Screening Items Score Screening NUTRITION RISK SCREEN: I have an illness or condition that made me change the kind and/or amount of food I eat 0 No I eat fewer than two meals per day 0 No I eat few fruits and vegetables, or milk products 0 No I have three or more drinks of beer, liquor or wine almost every day 0 No I have tooth or mouth problems that make it hard for me to eat 2 Yes I don't always have enough money to buy the food I need 0 No I eat alone most of the time 0 No I take three or more different prescribed or over-the-counter drugs a day 1 Yes Without wanting to, I have lost or gained 10 pounds in the last six months 0 No I am not always physically able to shop, cook and/or feed myself 0 No Nutrition Protocols Good Risk Protocol Moderate Risk Protocol 0 Provide education on nutrition High Risk Proctocol Risk Level: Moderate Risk Score: 3 Electronic Signature(s) Signed: 04/26/2020 4:59:01 PM By: Baruch Gouty RN, BSN Entered By: Baruch Gouty on 04/26/2020 16:02:01

## 2020-04-26 NOTE — Progress Notes (Addendum)
[name] ([id number]) , Visit Report for 04/26/2020 Allergy List Details Patient Name: Date of Service: [name], RO [place] LD D. 04/26/2020 2:45 PM Medical Record Number: [id number] Patient Account Number: [id number] Date of Birth/Sex: Treating RN: 1956-04-27 (64 y.o. [name] Primary Care [name]: Ria Bush Other Clinician: Referring [name]: Treating [name]/Extender: [name] in Treatment: 0 Allergies Active Allergies Statins-Hmg-Coa Reductase Inhibitors Reaction: SOB Severity: Moderate losartan Reaction: chest pain allopurinol Reaction: nausea baclofen Reaction: nausea/vomiting gabapentin Reaction: nausea/vomiting penicillin Reaction: nausea vomiting tramadol Reaction: nausea Severity: Moderate Allergy Notes Electronic Signature(s) Signed: 04/26/2020 4:59:01 PM By: Baruch Gouty RN, BSN Entered By: Baruch Gouty on 04/26/2020 15:44:56 -------------------------------------------------------------------------------- Arrival Information Details Patient Name: Date of Service: HO [name], RO NA LD D. 04/26/2020 2:45 PM Medical Record Number: [id number] Patient Account Number: [id number] Date of Birth/Sex: Treating RN: Apr 30, 1956 (64 y.o. [name] Primary Care [name]: Ria Bush Other Clinician: Referring Zyair Russi: Treating [name]/Extender: [name] in Treatment: 0 Visit Information Patient Arrived: Wheel Chair Arrival Time: 15:23 Accompanied By: spouse Transfer Assistance: None Patient Identification Verified: Yes Secondary Verification Process Completed: Yes Patient Requires Transmission-Based Precautions: No Patient Has Alerts: No Electronic Signature(s) Signed: 04/26/2020 4:59:01 PM By: Baruch Gouty RN, BSN Entered By: Baruch Gouty on 04/26/2020  15:27:20 -------------------------------------------------------------------------------- Clinic Level of Care Assessment Details Patient Name: Date of Service: HO [name], RO NA LD D. 04/26/2020 2:45 PM Medical Record Number: [id number] Patient Account Number: [id number] Date of Birth/Sex: Treating RN: 06/27/1955 (64 y.o. [name] Primary Care [name]: Ria Bush Other Clinician: Referring [name]: Treating [name]/Extender: [name] in Treatment: 0 Clinic Level of Care Assessment Items TOOL 1 Quantity Score X- 1 0 Use when EandM and Procedure is performed on INITIAL visit ASSESSMENTS - Nursing Assessment / Reassessment X- 1 20 General Physical Exam (combine w/ comprehensive assessment (listed just below) when performed on new pt. evals) X- 1 25 Comprehensive Assessment (HX, ROS, Risk Assessments, Wounds Hx, etc.) ASSESSMENTS - Wound and Skin Assessment / Reassessment []  - 0 Dermatologic / Skin Assessment (not related to wound area) ASSESSMENTS - Ostomy and/or Continence Assessment and Care []  - 0 Incontinence Assessment and Management []  - 0 Ostomy Care Assessment and Management (repouching, etc.) PROCESS - Coordination of Care X - Simple Patient / Family Education for ongoing care 1 15 []  - 0 Complex (extensive) Patient / Family Education for ongoing care X- 1 10 Staff obtains Programmer, systems, Records, T Results / Process Orders est []  - 0 Staff telephones HHA, Nursing Homes / Clarify orders / etc []  - 0 Routine Transfer to another Facility (non-emergent condition) []  - 0 Routine Hospital Admission (non-emergent condition) X- 1 15 New Admissions / Biomedical engineer / Ordering NPWT Apligraf, etc. , []  - 0 Emergency Hospital Admission (emergent condition) PROCESS - Special Needs []  - 0 Pediatric / Minor Patient Management []  - 0 Isolation Patient Management []  - 0 Hearing / Language / Visual special needs []  -  0 Assessment of Community assistance (transportation, D/C planning, etc.) []  - 0 Additional assistance / Altered mentation []  - 0 Support Surface(s) Assessment (bed, cushion, seat, etc.) INTERVENTIONS - Miscellaneous []  - 0 External ear exam []  - 0 Patient Transfer (multiple staff / Civil Service fast streamer / Similar devices) []  - 0 Simple Staple / Suture removal (25 or less) []  - 0 Complex Staple / Suture removal (26 or more) []  - 0 Hypo/Hyperglycemic Management (do not check if billed separately) []  -  0 Ankle / Brachial Index (ABI) - do not check if billed separately Has the patient been seen at the hospital within the last three years: Yes Total Score: 85 Level Of Care: New/Established - Level 3 Electronic Signature(s) Signed: 04/26/2020 5:46:54 PM By: [name] RN, BSN Entered By: [name] on 04/26/2020 17:46:20 -------------------------------------------------------------------------------- Encounter Discharge Information Details Patient Name: Date of Service: HO [name], RO NA LD D. 04/26/2020 2:45 PM Medical Record Number: [id number] Patient Account Number: [id number] Date of Birth/Sex: Treating RN: [birth year] (64 y.o. [name] Primary Care Ellaree Gear: Ria Bush Other Clinician: Referring Melina Mosteller: Treating Vadim Centola/Extender: [name] in Treatment: 0 Encounter Discharge Information Items Post Procedure Vitals Discharge Condition: Stable Temperature (F): 97.3 Ambulatory Status: Wheelchair Pulse (bpm): 90 Discharge Destination: Home Respiratory Rate (breaths/min): 18 Transportation: Private Auto Blood Pressure (mmHg): 117/64 Accompanied By: [name] Schedule Follow-up Appointment: No Clinical Summary of Care: Electronic Signature(s) Signed: 04/26/2020 5:18:41 PM By: Rhae Hammock RN Entered By: Rhae Hammock on 04/26/2020 17:11:16 -------------------------------------------------------------------------------- Lower  Extremity Assessment Details Patient Name: Date of Service: HO [name], RO NA LD D. 04/26/2020 2:45 PM Medical Record Number: [id number] Patient Account Number: [id number] Date of Birth/Sex: Treating RN: [birth year] (64 y.o. [name] Primary Care Criselda Starke: Ria Bush Other Clinician: Referring Nadege Carriger: Treating Delmer Kowalski/Extender: [name] in Treatment: 0 Electronic Signature(s) Signed: 04/26/2020 4:59:01 PM By: Baruch Gouty RN, BSN Entered By: Baruch Gouty on 04/26/2020 16:02:20 -------------------------------------------------------------------------------- Multi-Disciplinary Care Plan Details Patient Name: Date of Service: HO [name], RO NA LD D. 04/26/2020 2:45 PM Medical Record Number: [id number] Patient Account Number: [id number] Date of Birth/Sex: Treating RN: Aug 29, 1955 (64 y.o. [name] Primary Care Koty Anctil: Ria Bush Other Clinician: Referring Yana Schorr: Treating Sherlon Nied/Extender: [name] in Treatment: 0 Active Inactive Abuse / Safety / Falls / Self Care Management Nursing Diagnoses: Potential for falls Potential for injury related to falls Goals: Patient will not experience any injury related to falls Date Initiated: 04/26/2020 Target Resolution Date: 05/28/2020 Goal Status: Active Patient/caregiver will verbalize/demonstrate measures taken to improve the patient's personal safety Date Initiated: 04/26/2020 Target Resolution Date: 05/28/2020 Goal Status: Active Patient/caregiver will verbalize/demonstrate measures taken to prevent injury and/or falls Date Initiated: 04/26/2020 Target Resolution Date: 05/28/2020 Goal Status: Active Interventions: Assess Activities of Daily Living upon admission and as needed Assess fall risk on admission and as needed Assess: immobility, friction, shearing, incontinence upon admission and as needed Assess impairment of  mobility on admission and as needed per policy Assess personal safety and home safety (as indicated) on admission and as needed Assess self care needs on admission and as needed Provide education on fall prevention Provide education on personal and home safety Notes: Pressure Nursing Diagnoses: Knowledge deficit related to causes and risk factors for pressure ulcer development Knowledge deficit related to management of pressures ulcers Potential for impaired tissue integrity related to pressure, friction, moisture, and shear Goals: Patient/caregiver will verbalize risk factors for pressure ulcer development Date Initiated: 04/26/2020 Target Resolution Date: 05/28/2020 Goal Status: Active Patient/caregiver will verbalize understanding of pressure ulcer management Date Initiated: 04/26/2020 Target Resolution Date: 05/28/2020 Goal Status: Active Interventions: Assess: immobility, friction, shearing, incontinence upon admission and as needed Assess offloading mechanisms upon admission and as needed Assess potential for pressure ulcer upon admission and as needed Provide education on pressure ulcers Notes: Wound/Skin Impairment Nursing Diagnoses: Impaired tissue integrity Knowledge deficit related to ulceration/compromised skin integrity Goals: Patient/caregiver will verbalize understanding of skin care regimen Date  Initiated: 04/26/2020 Target Resolution Date: 05/28/2020 Goal Status: Active Ulcer/skin breakdown will have a volume reduction of 30% by week 4 Date Initiated: 04/26/2020 Target Resolution Date: 05/28/2020 Goal Status: Active Interventions: Assess patient/caregiver ability to obtain necessary supplies Assess patient/caregiver ability to perform ulcer/skin care regimen upon admission and as needed Assess ulceration(s) every visit Provide education on ulcer and skin care Notes: Electronic Signature(s) Signed: 04/26/2020 5:46:54 PM By: [name] RN, BSN Entered  By: [name] on 04/26/2020 17:45:44 -------------------------------------------------------------------------------- Pain Assessment Details Patient Name: Date of Service: HO [name], RO NA LD D. 04/26/2020 2:45 PM Medical Record Number: [id number] Patient Account Number: [id number] Date of Birth/Sex: Treating RN: [birth year] (64 y.o. [name] Primary Care Jillane Po: Ria Bush Other Clinician: Referring Leasia Swann: Treating Kenry Daubert/Extender: [name] in Treatment: 0 Active Problems Location of Pain Severity and Description of Pain Patient Has Paino Yes Site Locations Pain Location: Pain in Ulcers With Dressing Change: Yes Duration of the Pain. Constant / Intermittento Intermittent Rate the pain. Current Pain Level: 0 Worst Pain Level: 9 Least Pain Level: 0 Character of Pain Describe the Pain: Aching Pain Management and Medication Current Pain Management: Medication: Yes Other: reposition Is the Current Pain Management Adequate: Adequate How does your wound impact your activities of daily livingo Sleep: Yes Bathing: No Appetite: No Relationship With Others: No Bladder Continence: No Emotions: Yes Bowel Continence: No Drive: Yes Toileting: No Hobbies: Yes Dressing: No Electronic Signature(s) Signed: 04/26/2020 4:59:01 PM By: Baruch Gouty RN, BSN Entered By: Baruch Gouty on 04/26/2020 16:07:05 -------------------------------------------------------------------------------- Patient/Caregiver Education Details Patient Name: Date of Service: HO [name], RO NA LD D. 11/22/2021andnbsp2:45 PM Medical Record Number: [id number] Patient Account Number: [id number] Date of Birth/Gender: Treating RN: 04-01-1956 (64 y.o. [name] Primary Care Physician: Ria Bush Other Clinician: Referring Physician: Treating Physician/Extender: [name] in Treatment: 0 Education  Assessment Education Provided To: Patient Education Topics Provided Pressure: Methods: Explain/Verbal Responses: State content correctly Wound/Skin Impairment: Methods: Explain/Verbal Responses: State content correctly Electronic Signature(s) Signed: 04/26/2020 5:46:54 PM By: [name] RN, BSN Entered By: [name] on 04/26/2020 17:45:55 -------------------------------------------------------------------------------- Wound Assessment Details Patient Name: Date of Service: HO [name], RO NA LD D. 04/26/2020 2:45 PM Medical Record Number: [id number] Patient Account Number: [id number] Date of Birth/Sex: Treating RN: 06-Dec-1955 (64 y.o. [name] Primary Care Bentley Haralson: Ria Bush Other Clinician: Referring Cariana Karge: Treating Lavetta Geier/Extender: [name] in Treatment: 0 Wound Status Wound Number: 1 Primary Pressure Ulcer Etiology: Wound Location: Right Ischium Wound Open Wounding Event: Pressure Injury Status: Date Acquired: 03/05/2020 Comorbid Cataracts, Chronic Obstructive Pulmonary Disease (COPD), Ware Of Treatment: 0 History: Coronary Artery Disease, Hypertension, Myocardial Infarction, Clustered Wound: No Cirrhosis , Hepatitis B, Gout, Osteoarthritis Wound Measurements Length: (cm) 2.5 Width: (cm) 2.7 Depth: (cm) 0.1 Area: (cm) 5.301 Volume: (cm) 0.53 Wound Description Classification: Category/Stage III Wound Margin: Epibole Exudate Amount: Medium Exudate Type: Serosanguineous Exudate Color: red, brown Foul Odor After Cleansing: No Slough/Fibrino No % Reduction in Area: 0% % Reduction in Volume: 0% Tunneling: No Undermining: Yes Starting Position (o'clock): 4 Ending Position (o'clock): 8 Maximum Distance: (cm) 0.8 Wound Bed Granulation Amount: Large (67-100%) Exposed Structure Granulation Quality: Red Fascia Exposed: No Necrotic Amount: Small (1-33%) Fat Layer (Subcutaneous Tissue) Exposed:  Yes Necrotic Quality: Adherent Slough Tendon Exposed: No Muscle Exposed: No Joint Exposed: No Bone Exposed: No Treatment Notes Wound #1 (Right Ischium) 1. Cleanse With Saline 2. Periwound Care Skin Prep 3. Primary Dressing  Applied Calcium Alginate Ag 4. Secondary Dressing Foam Border Dressing Electronic Signature(s) Signed: 04/26/2020 5:46:54 PM By: [name] RN, BSN Signed: 04/27/2020 2:07:57 PM By: Baruch Gouty RN, BSN Previous Signature: 04/26/2020 4:59:01 PM Version By: Baruch Gouty RN, BSN Entered By: [name] on 04/26/2020 17:10:29 -------------------------------------------------------------------------------- Wound Assessment Details Patient Name: Date of Service: HO [name], RO NA LD D. 04/26/2020 2:45 PM Medical Record Number: [id number] Patient Account Number: [id number] Date of Birth/Sex: Treating RN: 12-16-1955 (64 y.o. [name] Primary Care Velmer Broadfoot: Ria Bush Other Clinician: Referring Teshawn Moan: Treating Toniqua Melamed/Extender: [name] in Treatment: 0 Wound Status Wound Number: 2 Primary Pressure Ulcer Etiology: Wound Location: Left Ischium Wound Open Wounding Event: Pressure Injury Status: Date Acquired: 03/05/2020 Comorbid Cataracts, Chronic Obstructive Pulmonary Disease (COPD), Ware Of Treatment: 0 History: Coronary Artery Disease, Hypertension, Myocardial Infarction, Clustered Wound: No Cirrhosis , Hepatitis B, Gout, Osteoarthritis Wound Measurements Length: (cm) 0.4 Width: (cm) 0.4 Depth: (cm) 0.1 Area: (cm) 0.126 Volume: (cm) 0.013 % Reduction in Area: 0% % Reduction in Volume: 0% Epithelialization: Small (1-33%) Tunneling: No Undermining: No Wound Description Classification: Unstageable/Unclassified Wound Margin: Flat and Intact Exudate Amount: Medium Exudate Type: Serous Exudate Color: amber Foul Odor After Cleansing: No Slough/Fibrino No Wound Bed Granulation  Amount: None Present (0%) Exposed Structure Necrotic Amount: Large (67-100%) Fascia Exposed: No Necrotic Quality: Eschar, Adherent Slough Fat Layer (Subcutaneous Tissue) Exposed: Yes Tendon Exposed: No Muscle Exposed: No Joint Exposed: No Bone Exposed: No Treatment Notes Wound #2 (Left Ischium) 1. Cleanse With Saline 2. Periwound Care Skin Prep 3. Primary Dressing Applied Calcium Alginate Ag 4. Secondary Dressing Foam Border Dressing Electronic Signature(s) Signed: 04/26/2020 5:46:54 PM By: [name] RN, BSN Signed: 04/27/2020 2:07:57 PM By: Baruch Gouty RN, BSN Previous Signature: 04/26/2020 4:59:01 PM Version By: Baruch Gouty RN, BSN Entered By: [name] on 04/26/2020 17:10:43 -------------------------------------------------------------------------------- [place] Details Patient Name: Date of Service: HO [name], RO NA LD D. 04/26/2020 2:45 PM Medical Record Number: [id number] Patient Account Number: [id number] Date of Birth/Sex: Treating RN: 16-Aug-1955 (64 y.o. [name] Primary Care Jowanda Heeg: Ria Bush Other Clinician: Referring Naidelin Gugliotta: Treating Keeven Matty/Extender: [name] in Treatment: 0 Vital Signs Time Taken: 15:39 Temperature (F): 98.8 Height (in): 69 Pulse (bpm): 74 Source: Stated Respiratory Rate (breaths/min): 18 Weight (lbs): 277 Blood Pressure (mmHg): 136/86 Source: Stated Reference Range: 80 - 120 mg / dl Body Mass Index (BMI): 40.9 Electronic Signature(s) Signed: 04/26/2020 4:59:01 PM By: Baruch Gouty RN, BSN Entered By: Baruch Gouty on 04/26/2020 15:40:27

## 2020-04-26 NOTE — Progress Notes (Addendum)
[name] ([id number]) , Visit Report for 04/26/2020 Chief Complaint Document Details Patient Name: Date of Service: HO [place], [place] LD D. 04/26/2020 2:45 PM Medical Record Number: [id number] Patient Account Number: [id number] Date of Birth/Sex: Treating RN: 1955-09-25 (64 y.o. [name] Primary Care Provider: Ria Ware Other Clinician: Referring Provider: Treating Provider/Extender: [name] in Treatment: 0 Information Obtained from: Patient Chief Complaint Bilateral Buttock/Ischial Ulcers Electronic Signature(s) Signed: 04/26/2020 4:46:33 PM By: Miguel Keeler PA-C Entered By: [name] on 04/26/2020 16:46:32 -------------------------------------------------------------------------------- Debridement Details Patient Name: Date of Service: HO [name], RO NA LD D. 04/26/2020 2:45 PM Medical Record Number: [id number] Patient Account Number: [id number] Date of Birth/Sex: Treating RN: 02/24/1956 (63 y.o. [name] Primary Care Provider: Ria Ware Other Clinician: Referring Provider: Treating Provider/Extender: [name] in Treatment: 0 Debridement Performed for Assessment: Wound #1 Right Ischium Performed By: Clinician Miguel Hurst, RN Debridement Type: Chemical/Enzymatic/Mechanical Agent Used: Anasept and gauze to remove biofilm Level of Consciousness (Pre-procedure): Awake and Alert Pre-procedure Verification/Time Out No Taken: Bleeding: None Procedural Pain: 0 Post Procedural Pain: 0 Response to Treatment: Procedure was tolerated well Level of Consciousness (Post- Awake and Alert procedure): Post Debridement Measurements of Total Wound Length: (cm) 2.5 Stage: Category/Stage III Width: (cm) 2.7 Depth: (cm) 0.1 Volume: (cm) 0.53 Character of Wound/Ulcer Post Debridement: Improved Post Procedure Diagnosis Same as Pre-procedure Electronic  Signature(s) Signed: 04/26/2020 5:46:54 PM By: [name] Signed: 04/28/2020 5:04:05 PM By: Miguel Keeler PA-C Entered By: [name] on 04/26/2020 17:09:33 -------------------------------------------------------------------------------- Debridement Details Patient Name: Date of Service: HO [name], RO NA LD D. 04/26/2020 2:45 PM Medical Record Number: [id number] Patient Account Number: [id number] Date of Birth/Sex: Treating RN: 06-07-1955 (64 y.o. [name] Primary Care Provider: Ria Ware Other Clinician: Referring Provider: Treating Provider/Extender: [name] in Treatment: 0 Debridement Performed for Assessment: Wound #2 Left Ischium Performed By: Clinician Miguel Hurst, RN Debridement Type: Chemical/Enzymatic/Mechanical Agent Used: Anasept and gauze to remove biofilm Level of Consciousness (Pre-procedure): Awake and Alert Pre-procedure Verification/Time Out No Taken: Bleeding: None Procedural Pain: 0 Post Procedural Pain: 0 Response to Treatment: Procedure was tolerated well Level of Consciousness (Post- Awake and Alert procedure): Post Debridement Measurements of Total Wound Length: (cm) 0.4 Stage: Unstageable/Unclassified Width: (cm) 0.4 Depth: (cm) 0.1 Volume: (cm) 0.013 Character of Wound/Ulcer Post Debridement: Improved Post Procedure Diagnosis Same as Pre-procedure Electronic Signature(s) Signed: 04/26/2020 5:46:54 PM By: [name] Signed: 04/28/2020 5:04:05 PM By: Miguel Keeler PA-C Entered By: [name] on 04/26/2020 17:09:52 -------------------------------------------------------------------------------- HPI Details Patient Name: Date of Service: HO [name], RO NA LD D. 04/26/2020 2:45 PM Medical Record Number: [id number] Patient Account Number: [id number] Date of Birth/Sex: Treating RN: 02-Jul-1955 (64 y.o. [name] Primary Care Provider: Ria Ware Other  Clinician: Referring Provider: Treating Provider/Extender: [name] in Treatment: 0 History of Present Illness HPI Description: 04/26/2020 upon evaluation today patient appears to be doing somewhat poorly in regard to the wounds that he has in the bilateral gluteal/ischial locations. He has been tolerating the dressing changes without complication the right now they have been using Silvadene I am not really certain that is the best way to go. He does have a history of hidradenitis unfortunately though I do not believe this is what that appears to be at this point. He has been in the bed pretty much all the time for the  past 3 years she is supposed to have had a hip replacement due to bone-on-bone issues with his hip but this has been delayed three separate times once because of Covid then the second time he was actually getting ready to going to surgery when the anesthesiologist and doctor came out and said his platelets were too low and and most recently was this past September when he was supposed to be going in for surgery but again this got called off due to Covid issues. He is obviously admittedly and to be honest I cannot blame him frustrated. He does have a history of an abdominal aortic aneurysm without rupture that has previously been stented. He has COPD, congestive heart failure, and peripheral vascular disease. With that being said unfortunately I think that he is going require a bit of healing prior to being able to proceed with his long awaited surgery I do not believe there to do anything with open wounds in this area where they are currently. That means that we do need to be very diligent about trying to get these areas healed as quickly and effectively as possible. Electronic Signature(s) Signed: 04/26/2020 5:27:35 PM By: Miguel Keeler PA-C Entered By: [name] on 04/26/2020  17:27:35 -------------------------------------------------------------------------------- Physical Exam Details Patient Name: Date of Service: HO [name], RO NA LD D. 04/26/2020 2:45 PM Medical Record Number: [id number] Patient Account Number: [id number] Date of Birth/Sex: Treating RN: Feb 19, 1956 (64 y.o. [name] Primary Care Provider: Ria Ware Other Clinician: Referring Provider: Treating Provider/Extender: [name] Weeks in Treatment: 0 Constitutional sitting or standing blood pressure is within target range for patient.. pulse regular and within target range for patient.[name] respirations regular, non-labored and within target range for patient.[name] temperature within target range for patient.. Well-nourished and well-hydrated in no acute distress. Eyes conjunctiva clear no eyelid edema noted. pupils equal round and reactive to light and accommodation. Ears, Nose, Mouth, and Throat no gross abnormality of ear auricles or external auditory canals. normal hearing noted during conversation. mucus membranes moist. Respiratory normal breathing without difficulty. Cardiovascular regular rate and rhythm with normal S1, S2. Musculoskeletal normal gait and posture. no significant deformity or arthritic changes, no loss or range of motion, no clubbing. Psychiatric this patient is able to make decisions and demonstrates good insight into disease process. Alert and Oriented x 3. pleasant and cooperative. Notes Upon inspection patient's wounds actually on the left appear to be fairly mild and again this actually appears to be more of an abscess type issue at this point even more so potentially the pressure although there could be some overlap. No sharp debridement was performed or necessary today I did perform mechanical debridement with saline and gauze however to clean up the wound surfaces. Electronic Signature(s) Signed: 04/26/2020 5:28:50 PM By: Miguel Keeler PA-C Entered By: [name] on 04/26/2020 17:28:50 -------------------------------------------------------------------------------- Physician Orders Details Patient Name: Date of Service: HO [name], RO NA LD D. 04/26/2020 2:45 PM Medical Record Number: [id number] Patient Account Number: [id number] Date of Birth/Sex: Treating RN: 12-12-1955 (64 y.o. [name] Primary Care Provider: Ria Ware Other Clinician: Referring Provider: Treating Provider/Extender: [name] in Treatment: 0 Verbal / Phone Orders: No Diagnosis Coding ICD-10 Coding Code Description L89.313 Pressure ulcer of right buttock, stage 3 L89.320 Pressure ulcer of left buttock, unstageable I71.4 Abdominal aortic aneurysm, without rupture J44.9 Chronic obstructive pulmonary disease, unspecified I50.42 Chronic combined systolic (congestive) and diastolic (congestive) heart failure I73.89  Other specified peripheral vascular diseases Follow-up Appointments Return Appointment in 2 weeks. Dressing Change Frequency Wound #1 Right Ischium Change Dressing every other day. Wound #2 Left Ischium Change Dressing every other day. Wound Cleansing May shower and wash wound with soap and water. - on days that dressing is changed Primary Wound Dressing Wound #1 Right Ischium lginate with Silver - thin layer of Mupirocin under alginate Calcium A Wound #2 Left Ischium lginate with Silver - thin layer of Mupirocin under alginate Calcium A Secondary Dressing Wound #1 Right Ischium Foam Border - or ABD pad and tape Wound #2 Left Ischium Foam Border - or ABD pad and tape Off-Loading Turn and reposition every 2 hours Patient Medications llergies: Statins-Hmg-Coa Reductase Inhibitors, tramadol, losartan, allopurinol, baclofen, gabapentin, penicillin A Notifications Medication Indication Start End 04/26/2020 mupirocin DOSE topical 2 % ointment - ointment topical  applied every 2 days to the buttock wounds with dressing changes as directed in the clinic Electronic Signature(s) Signed: 04/26/2020 5:30:00 PM By: Miguel Keeler PA-C Entered By: [name] on 04/26/2020 17:30:00 -------------------------------------------------------------------------------- Problem List Details Patient Name: Date of Service: HO [name], RO NA LD D. 04/26/2020 2:45 PM Medical Record Number: [id number] Patient Account Number: [id number] Date of Birth/Sex: Treating RN: [birth year] (64 y.o. [name] Primary Care Provider: Ria Ware Other Clinician: Referring Provider: Treating Provider/Extender: [name] Weeks in Treatment: 0 Active Problems ICD-10 Encounter Code Description Active Date MDM Diagnosis L89.313 Pressure ulcer of right buttock, stage 3 04/26/2020 No Yes L89.320 Pressure ulcer of left buttock, unstageable 04/26/2020 No Yes I71.4 Abdominal aortic aneurysm, without rupture 04/26/2020 No Yes J44.9 Chronic obstructive pulmonary disease, unspecified 04/26/2020 No Yes I50.42 Chronic combined systolic (congestive) and diastolic (congestive) heart failure 04/26/2020 No Yes I73.89 Other specified peripheral vascular diseases 04/26/2020 No Yes Inactive Problems Resolved Problems Electronic Signature(s) Signed: 04/26/2020 4:48:08 PM By: Miguel Keeler PA-C Previous Signature: 04/26/2020 3:40:50 PM Version By: Miguel Keeler PA-C Entered By: [name] on 04/26/2020 16:48:07 -------------------------------------------------------------------------------- Progress Note Details Patient Name: Date of Service: HO [name], RO NA LD D. 04/26/2020 2:45 PM Medical Record Number: [id number] Patient Account Number: [id number] Date of Birth/Sex: Treating RN: 08/28/1955 (64 y.o. [name] Primary Care Provider: Ria Ware Other Clinician: Referring Provider: Treating Provider/Extender: [name] in Treatment: 0 Subjective Chief Complaint Information obtained from Patient Bilateral Buttock/Ischial Ulcers History of Present Illness (HPI) 04/26/2020 upon evaluation today patient appears to be doing somewhat poorly in regard to the wounds that he has in the bilateral gluteal/ischial locations. He has been tolerating the dressing changes without complication the right now they have been using Silvadene I am not really certain that is the best way to go. He does have a history of hidradenitis unfortunately though I do not believe this is what that appears to be at this point. He has been in the bed pretty much all the time for the past 3 years she is supposed to have had a hip replacement due to bone-on-bone issues with his hip but this has been delayed three separate times once because of Covid then the second time he was actually getting ready to going to surgery when the anesthesiologist and doctor came out and said his platelets were too low and and most recently was this past September when he was supposed to be going in for surgery but again this got called off due to Covid issues. He is obviously admittedly and to be  honest I cannot blame him frustrated. He does have a history of an abdominal aortic aneurysm without rupture that has previously been stented. He has COPD, congestive heart failure, and peripheral vascular disease. With that being said unfortunately I think that he is going require a bit of healing prior to being able to proceed with his long awaited surgery I do not believe there to do anything with open wounds in this area where they are currently. That means that we do need to be very diligent about trying to get these areas healed as quickly and effectively as possible. Patient History Allergies Statins-Hmg-Coa Reductase Inhibitors (Severity: Moderate, Reaction: SOB), losartan (Reaction: chest pain), allopurinol (Reaction: nausea),  baclofen (Reaction: nausea/vomiting), gabapentin (Reaction: nausea/vomiting), penicillin (Reaction: nausea vomiting), tramadol (Severity: Moderate, Reaction: nausea) Family History Cancer - Mother,Father,Siblings, Diabetes - Mother, Heart Disease - Father, Thyroid Problems - Siblings, No family history of Hereditary Spherocytosis, Hypertension, Kidney Disease, Lung Disease, Seizures, Stroke, Tuberculosis. Social History Current every day smoker - 1 PPD, Marital Status - Married, Alcohol Use - Never, Drug Use - Prior History - TCH, Caffeine Use - Ware - coffee. Medical History Eyes Patient has history of Cataracts - bilateral Denies history of Glaucoma, Optic Neuritis Ear/Nose/Mouth/Throat Denies history of Chronic sinus problems/congestion, Middle ear problems Respiratory Patient has history of Chronic Obstructive Pulmonary Disease (COPD) Cardiovascular Patient has history of Coronary Artery Disease, Hypertension, Myocardial Infarction Gastrointestinal Patient has history of Cirrhosis , Hepatitis B - hx Endocrine Denies history of Type I Diabetes, Type II Diabetes Genitourinary Denies history of End Stage Renal Disease Musculoskeletal Patient has history of Gout, Osteoarthritis - right hip Oncologic Denies history of Received Chemotherapy, Received Radiation Psychiatric Denies history of Anorexia/bulimia, Confinement Anxiety Hospitalization/Surgery History - lumbar fusion,. - cervical fusion. - AAA endovascular stent graft. - coronary angioplasty with stent. - EGD. - hidradenitis excision. - tonsillectomy and adenoidectomy. Medical A Surgical History Notes nd Constitutional Symptoms (General Health) obesity, B12 deficiency, vitamin D deficiency Ear/Nose/Mouth/Throat seasonal allergies Hematologic/Lymphatic low platelets Cardiovascular cardiomyopathy, AAA repair, hyperlipidemia Gastrointestinal GERD Genitourinary difficulty  urinating Immunological hidradenitis Musculoskeletal DDD, cervical spondylosis, lumbar herniated disc Neurologic Charcot Marie Tooth muscular atrophy, hx viral meningitis Review of Systems (ROS) Constitutional Symptoms (General Health) Complains or has symptoms of Fatigue. Eyes Complains or has symptoms of Glasses / Contacts. Ear/Nose/Mouth/Throat Denies complaints or symptoms of Chronic sinus problems or rhinitis. Respiratory Complains or has symptoms of Shortness of Breath. Denies complaints or symptoms of Chronic or frequent coughs. Cardiovascular Denies complaints or symptoms of Chest pain. Endocrine Denies complaints or symptoms of Heat/cold intolerance. Genitourinary Denies complaints or symptoms of Frequent urination. Integumentary (Skin) Complains or has symptoms of Wounds - bil ischium. Musculoskeletal Complains or has symptoms of Muscle Weakness. Psychiatric Denies complaints or symptoms of Claustrophobia, Suicidal. Objective Constitutional sitting or standing blood pressure is within target range for patient.. pulse regular and within target range for patient.[name] respirations regular, non-labored and within target range for patient.[name] temperature within target range for patient.. Well-nourished and well-hydrated in no acute distress. Vitals Time Taken: 3:39 PM, Height: 69 in, Source: Stated, Weight: 277 lbs, Source: Stated, BMI: 40.9, Temperature: 98.8 F, Pulse: 74 bpm, Respiratory Rate: 18 breaths/min, Blood Pressure: 136/86 mmHg. Eyes conjunctiva clear no eyelid edema noted. pupils equal round and reactive to light and accommodation. Ears, Nose, Mouth, and Throat no gross abnormality of ear auricles or external auditory canals. normal hearing noted during conversation. mucus membranes moist. Respiratory normal breathing without difficulty. Cardiovascular regular rate and rhythm with  normal S1, S2. Musculoskeletal normal gait and posture. no significant  deformity or arthritic changes, no loss or range of motion, no clubbing. Psychiatric this patient is able to make decisions and demonstrates good insight into disease process. Alert and Oriented x 3. pleasant and cooperative. General Notes: Upon inspection patient's wounds actually on the left appear to be fairly mild and again this actually appears to be more of an abscess type issue at this point even more so potentially the pressure although there could be some overlap. No sharp debridement was performed or necessary today I did perform mechanical debridement with saline and gauze however to clean up the wound surfaces. Integumentary (Hair, Skin) Wound #1 status is Open. Original cause of wound was Pressure Injury. The wound is located on the Right Ischium. The wound measures 2.5cm length x 2.7cm width x 0.1cm depth; 5.301cm^2 area and 0.53cm^3 volume. There is Fat Layer (Subcutaneous Tissue) exposed. There is no tunneling noted, however, there is undermining starting at 4:00 and ending at 8:00 with a maximum distance of 0.8cm. There is a medium amount of serosanguineous drainage noted. The wound margin is epibole. There is large (67-100%) red granulation within the wound bed. There is a small (1-33%) amount of necrotic tissue within the wound bed including Adherent Slough. Wound #2 status is Open. Original cause of wound was Pressure Injury. The wound is located on the Left Ischium. The wound measures 0.4cm length x 0.4cm width x 0.1cm depth; 0.126cm^2 area and 0.013cm^3 volume. There is Fat Layer (Subcutaneous Tissue) exposed. There is no tunneling or undermining noted. There is a medium amount of serous drainage noted. The wound margin is flat and intact. There is no granulation within the wound bed. There is a large (67- 100%) amount of necrotic tissue within the wound bed including Eschar and Adherent Slough. Assessment Active Problems ICD-10 Pressure ulcer of right buttock, stage  3 Pressure ulcer of left buttock, unstageable Abdominal aortic aneurysm, without rupture Chronic obstructive pulmonary disease, unspecified Chronic combined systolic (congestive) and diastolic (congestive) heart failure Other specified peripheral vascular diseases Procedures Wound #1 Pre-procedure diagnosis of Wound #1 is a Pressure Ulcer located on the Right Ischium . There was a Chemical/Enzymatic/Mechanical debridement performed by Miguel Hurst, RN.. Other agent used was Anasept and gauze to remove biofilm. There was no bleeding. The procedure was tolerated well with a pain level of 0 throughout and a pain level of 0 following the procedure. Post Debridement Measurements: 2.5cm length x 2.7cm width x 0.1cm depth; 0.53cm^3 volume. Post debridement Stage noted as Category/Stage III. Character of Wound/Ulcer Post Debridement is improved. Post procedure Diagnosis Wound #1: Same as Pre-Procedure Wound #2 Pre-procedure diagnosis of Wound #2 is a Pressure Ulcer located on the Left Ischium . There was a Chemical/Enzymatic/Mechanical debridement performed by Miguel Hurst, RN.. Other agent used was Anasept and gauze to remove biofilm. There was no bleeding. The procedure was tolerated well with a pain level of 0 throughout and a pain level of 0 following the procedure. Post Debridement Measurements: 0.4cm length x 0.4cm width x 0.1cm depth; 0.013cm^3 volume. Post debridement Stage noted as Unstageable/Unclassified. Character of Wound/Ulcer Post Debridement is improved. Post procedure Diagnosis Wound #2: Same as Pre-Procedure Plan Follow-up Appointments: Return Appointment in 2 weeks. Dressing Change Frequency: Wound #1 Right Ischium: Change Dressing every other day. Wound #2 Left Ischium: Change Dressing every other day. Wound Cleansing: May shower and wash wound with soap and water. - on days that dressing is changed Primary Wound Dressing:  Wound #1 Right Ischium: Calcium Alginate with  Silver - thin layer of Mupirocin under alginate Wound #2 Left Ischium: Calcium Alginate with Silver - thin layer of Mupirocin under alginate Secondary Dressing: Wound #1 Right Ischium: Foam Border - or ABD pad and tape Wound #2 Left Ischium: Foam Border - or ABD pad and tape Off-Loading: Turn and reposition every 2 hours The following medication(s) was prescribed: mupirocin topical 2 % ointment ointment topical applied every 2 days to the buttock wounds with dressing changes as directed in the clinic starting 04/26/2020 1. Would recommend at this time that we actually going to initiate treatment with mupirocin ointment thinly applied to the wound locations then followed by silver alginate dressing to try to help treat the infection at this point. 2. I am also can recommend that we initiate treatment with a silver alginate dressing to try to keep the area clean and dry as much as possible. 3. I am also can recommend that he try to keep pressure off of this area is much as possible he notes that he does try to do so. We will see patient back for reevaluation in 2 weeks here in the clinic. If anything worsens or changes patient will contact our office for additional recommendations. Electronic Signature(s) Signed: 04/26/2020 5:34:47 PM By: Miguel Keeler PA-C Entered By: [name] on 04/26/2020 17:34:47 -------------------------------------------------------------------------------- HxROS Details Patient Name: Date of Service: HO [name], RO NA LD D. 04/26/2020 2:45 PM Medical Record Number: [id number] Patient Account Number: [id number] Date of Birth/Sex: Treating RN: 07-Aug-1955 (64 y.o. [name] Primary Care Provider: Ria Ware Other Clinician: Referring Provider: Treating Provider/Extender: [name] Weeks in Treatment: 0 Constitutional Symptoms (General Health) Complaints and Symptoms: Positive for: Fatigue Medical History: Past  Medical History Notes: obesity, B12 deficiency, vitamin D deficiency Eyes Complaints and Symptoms: Positive for: Glasses / Contacts Medical History: Positive for: Cataracts - bilateral Negative for: Glaucoma; Optic Neuritis Ear/Nose/Mouth/Throat Complaints and Symptoms: Negative for: Chronic sinus problems or rhinitis Medical History: Negative for: Chronic sinus problems/congestion; Middle ear problems Past Medical History Notes: seasonal allergies Respiratory Complaints and Symptoms: Positive for: Shortness of Breath Negative for: Chronic or frequent coughs Medical History: Positive for: Chronic Obstructive Pulmonary Disease (COPD) Cardiovascular Complaints and Symptoms: Negative for: Chest pain Medical History: Positive for: Coronary Artery Disease; Hypertension; Myocardial Infarction Past Medical History Notes: cardiomyopathy, AAA repair, hyperlipidemia Endocrine Complaints and Symptoms: Negative for: Heat/cold intolerance Medical History: Negative for: Type I Diabetes; Type II Diabetes Genitourinary Complaints and Symptoms: Negative for: Frequent urination Medical History: Negative for: End Stage Renal Disease Past Medical History Notes: difficulty urinating Integumentary (Skin) Complaints and Symptoms: Positive for: Wounds - bil ischium Musculoskeletal Complaints and Symptoms: Positive for: Muscle Weakness Medical History: Positive for: Gout; Osteoarthritis - right hip Past Medical History Notes: DDD, cervical spondylosis, lumbar herniated disc Psychiatric Complaints and Symptoms: Negative for: Claustrophobia; Suicidal Medical History: Negative for: Anorexia/bulimia; Confinement Anxiety Hematologic/Lymphatic Medical History: Past Medical History Notes: low platelets Gastrointestinal Medical History: Positive for: Cirrhosis ; Hepatitis B - hx Past Medical History Notes: GERD Immunological Medical History: Past Medical History  Notes: hidradenitis Neurologic Medical History: Past Medical History Notes: Charcot Lelan Pons Tooth muscular atrophy, hx viral meningitis Oncologic Medical History: Negative for: Received Chemotherapy; Received Radiation HBO Extended History Items Eyes: Cataracts Immunizations Pneumococcal Vaccine: Received Pneumococcal Vaccination: No Implantable Devices Yes Hospitalization / Surgery History Type of Hospitalization/Surgery lumbar fusion, cervical fusion AAA endovascular stent graft coronary angioplasty with stent EGD hidradenitis  excision tonsillectomy and adenoidectomy Family and Social History Cancer: Yes - Mother,Father,Siblings; Diabetes: Yes - Mother; Heart Disease: Yes - Father; Hereditary Spherocytosis: No; Hypertension: No; Kidney Disease: No; Lung Disease: No; Seizures: No; Stroke: No; Thyroid Problems: Yes - Siblings; Tuberculosis: No; Current every day smoker - 1 PPD; Marital Status - Married; Alcohol Use: Never; Drug Use: Prior History - TCH; Caffeine Use: Ware - coffee; Financial Concerns: No; Food, Clothing or Shelter Needs: No; Support System Lacking: No; Transportation Concerns: No Electronic Signature(s) Signed: 04/26/2020 4:59:01 PM By: Baruch Gouty RN, [place] Signed: 04/28/2020 5:04:05 PM By: Miguel Keeler PA-C Entered By: Baruch Gouty on 04/26/2020 15:59:14 -------------------------------------------------------------------------------- SuperBill Details Patient Name: Date of Service: HO [name], RO NA LD D. 04/26/2020 Medical Record Number: [id number] Patient Account Number: [id number] Date of Birth/Sex: Treating RN: Nov 10, 1955 (64 y.o. [name] Primary Care Provider: Ria Ware Other Clinician: Referring Provider: Treating Provider/Extender: [name] Weeks in Treatment: 0 Diagnosis Coding ICD-10 Codes Code Description [id number] Pressure ulcer of right buttock, stage 3 L89.320 Pressure ulcer of left  buttock, unstageable I71.4 Abdominal aortic aneurysm, without rupture J44.9 Chronic obstructive pulmonary disease, unspecified I50.42 Chronic combined systolic (congestive) and diastolic (congestive) heart failure I73.89 Other specified peripheral vascular diseases Facility Procedures CPT4 Code: [id number] Description: 99213 - WOUND CARE VISIT-LEV 3 EST PT Modifier: 25 Quantity: 1 Physician Procedures : CPT4 Code Description Modifier [id number] - WC PHYS LEVEL 4 - NEW PT ICD-10 Diagnosis Description L89.313 Pressure ulcer of right buttock, stage 3 L89.320 Pressure ulcer of left buttock, unstageable I71.4 Abdominal aortic aneurysm, without rupture  J44.9 Chronic obstructive pulmonary disease, unspecified Quantity: 1 Electronic Signature(s) Signed: 04/26/2020 5:46:54 PM By: [name] Signed: 04/28/2020 5:04:05 PM By: Miguel Keeler PA-C Previous Signature: 04/26/2020 5:35:18 PM Version By: Miguel Keeler PA-C Entered By: [name] on 04/26/2020 17:46:34

## 2020-04-27 ENCOUNTER — Other Ambulatory Visit: Payer: Self-pay | Admitting: Family Medicine

## 2020-04-27 DIAGNOSIS — L89320 Pressure ulcer of left buttock, unstageable: Secondary | ICD-10-CM | POA: Diagnosis not present

## 2020-04-27 DIAGNOSIS — L89313 Pressure ulcer of right buttock, stage 3: Secondary | ICD-10-CM | POA: Diagnosis not present

## 2020-04-28 ENCOUNTER — Telehealth: Payer: Self-pay | Admitting: Family Medicine

## 2020-04-28 ENCOUNTER — Encounter: Payer: Self-pay | Admitting: Family Medicine

## 2020-04-28 NOTE — Telephone Encounter (Signed)
Called and left vm for the patient to schedule MWV, CPE and labs EM

## 2020-04-28 NOTE — Telephone Encounter (Signed)
E-scribed refill.  Plz schedule wellness, cpe and lab visits.

## 2020-04-28 NOTE — Telephone Encounter (Signed)
Patient's wife called in stating that he had received a jury summons. Patients wife is calling to see if we can write a letter to have him excused to being disabled and unable to come to court. Best contact # is [phone] EM

## 2020-05-03 ENCOUNTER — Other Ambulatory Visit: Payer: Self-pay | Admitting: Gastroenterology

## 2020-05-03 NOTE — Telephone Encounter (Addendum)
Letter written and in Lisa's box.  

## 2020-05-04 NOTE — Telephone Encounter (Signed)
Spoke with pt's wife, [name] (on dpr), notifying her the jury letter is ready.  Says she will pick it up and expresses her thanks.  [Placed letter at front office- yellow folders.]

## 2020-05-10 ENCOUNTER — Encounter (HOSPITAL_BASED_OUTPATIENT_CLINIC_OR_DEPARTMENT_OTHER): Payer: Medicare HMO | Attending: Internal Medicine | Admitting: Internal Medicine

## 2020-05-11 ENCOUNTER — Encounter (HOSPITAL_BASED_OUTPATIENT_CLINIC_OR_DEPARTMENT_OTHER): Payer: Medicare HMO | Attending: Internal Medicine | Admitting: Internal Medicine

## 2020-05-12 DIAGNOSIS — G600 Hereditary motor and sensory neuropathy: Secondary | ICD-10-CM | POA: Diagnosis not present

## 2020-05-12 DIAGNOSIS — M25511 Pain in right shoulder: Secondary | ICD-10-CM | POA: Diagnosis not present

## 2020-05-12 DIAGNOSIS — M542 Cervicalgia: Secondary | ICD-10-CM | POA: Diagnosis not present

## 2020-05-12 DIAGNOSIS — G894 Chronic pain syndrome: Secondary | ICD-10-CM | POA: Diagnosis not present

## 2020-05-12 DIAGNOSIS — Z79891 Long term (current) use of opiate analgesic: Secondary | ICD-10-CM | POA: Diagnosis not present

## 2020-05-12 DIAGNOSIS — M25551 Pain in right hip: Secondary | ICD-10-CM | POA: Diagnosis not present

## 2020-05-12 DIAGNOSIS — Z79899 Other long term (current) drug therapy: Secondary | ICD-10-CM | POA: Diagnosis not present

## 2020-05-20 ENCOUNTER — Encounter (HOSPITAL_BASED_OUTPATIENT_CLINIC_OR_DEPARTMENT_OTHER): Payer: Medicare HMO | Admitting: Internal Medicine

## 2020-05-24 DIAGNOSIS — B079 Viral wart, unspecified: Secondary | ICD-10-CM | POA: Diagnosis not present

## 2020-05-24 DIAGNOSIS — D485 Neoplasm of uncertain behavior of skin: Secondary | ICD-10-CM | POA: Diagnosis not present

## 2020-05-25 DIAGNOSIS — G600 Hereditary motor and sensory neuropathy: Secondary | ICD-10-CM | POA: Diagnosis not present

## 2020-05-27 ENCOUNTER — Encounter (HOSPITAL_BASED_OUTPATIENT_CLINIC_OR_DEPARTMENT_OTHER): Payer: Medicare HMO | Admitting: Internal Medicine

## 2020-06-01 DIAGNOSIS — D61818 Other pancytopenia: Secondary | ICD-10-CM | POA: Diagnosis not present

## 2020-06-01 DIAGNOSIS — E441 Mild protein-calorie malnutrition: Secondary | ICD-10-CM | POA: Diagnosis not present

## 2020-06-01 DIAGNOSIS — G8929 Other chronic pain: Secondary | ICD-10-CM | POA: Diagnosis not present

## 2020-06-01 DIAGNOSIS — F1411 Cocaine abuse, in remission: Secondary | ICD-10-CM | POA: Diagnosis not present

## 2020-06-01 DIAGNOSIS — J449 Chronic obstructive pulmonary disease, unspecified: Secondary | ICD-10-CM | POA: Diagnosis not present

## 2020-06-01 DIAGNOSIS — E1159 Type 2 diabetes mellitus with other circulatory complications: Secondary | ICD-10-CM | POA: Diagnosis not present

## 2020-06-01 DIAGNOSIS — K7460 Unspecified cirrhosis of liver: Secondary | ICD-10-CM | POA: Diagnosis not present

## 2020-06-01 DIAGNOSIS — I509 Heart failure, unspecified: Secondary | ICD-10-CM | POA: Diagnosis not present

## 2020-06-01 DIAGNOSIS — I25118 Atherosclerotic heart disease of native coronary artery with other forms of angina pectoris: Secondary | ICD-10-CM | POA: Diagnosis not present

## 2020-06-01 DIAGNOSIS — F1110 Opioid abuse, uncomplicated: Secondary | ICD-10-CM | POA: Diagnosis not present

## 2020-06-01 DIAGNOSIS — F17200 Nicotine dependence, unspecified, uncomplicated: Secondary | ICD-10-CM | POA: Diagnosis not present

## 2020-06-17 ENCOUNTER — Encounter (HOSPITAL_BASED_OUTPATIENT_CLINIC_OR_DEPARTMENT_OTHER): Payer: Medicare HMO | Admitting: Internal Medicine

## 2020-06-18 ENCOUNTER — Ambulatory Visit: Payer: Medicare HMO | Admitting: Family Medicine

## 2020-06-18 DIAGNOSIS — G600 Hereditary motor and sensory neuropathy: Secondary | ICD-10-CM | POA: Diagnosis not present

## 2020-06-18 DIAGNOSIS — M542 Cervicalgia: Secondary | ICD-10-CM | POA: Diagnosis not present

## 2020-06-18 DIAGNOSIS — M25551 Pain in right hip: Secondary | ICD-10-CM | POA: Diagnosis not present

## 2020-06-18 DIAGNOSIS — Z79891 Long term (current) use of opiate analgesic: Secondary | ICD-10-CM | POA: Diagnosis not present

## 2020-06-18 DIAGNOSIS — G894 Chronic pain syndrome: Secondary | ICD-10-CM | POA: Diagnosis not present

## 2020-06-18 DIAGNOSIS — M25511 Pain in right shoulder: Secondary | ICD-10-CM | POA: Diagnosis not present

## 2020-06-25 DIAGNOSIS — G600 Hereditary motor and sensory neuropathy: Secondary | ICD-10-CM | POA: Diagnosis not present

## 2020-06-29 DIAGNOSIS — E1159 Type 2 diabetes mellitus with other circulatory complications: Secondary | ICD-10-CM | POA: Diagnosis not present

## 2020-06-29 DIAGNOSIS — K766 Portal hypertension: Secondary | ICD-10-CM | POA: Diagnosis not present

## 2020-06-29 DIAGNOSIS — I25118 Atherosclerotic heart disease of native coronary artery with other forms of angina pectoris: Secondary | ICD-10-CM | POA: Diagnosis not present

## 2020-06-29 DIAGNOSIS — D692 Other nonthrombocytopenic purpura: Secondary | ICD-10-CM | POA: Diagnosis not present

## 2020-06-29 DIAGNOSIS — I509 Heart failure, unspecified: Secondary | ICD-10-CM | POA: Diagnosis not present

## 2020-06-29 DIAGNOSIS — D61818 Other pancytopenia: Secondary | ICD-10-CM | POA: Diagnosis not present

## 2020-06-29 DIAGNOSIS — F17200 Nicotine dependence, unspecified, uncomplicated: Secondary | ICD-10-CM | POA: Diagnosis not present

## 2020-06-29 DIAGNOSIS — K7460 Unspecified cirrhosis of liver: Secondary | ICD-10-CM | POA: Diagnosis not present

## 2020-06-29 DIAGNOSIS — E441 Mild protein-calorie malnutrition: Secondary | ICD-10-CM | POA: Diagnosis not present

## 2020-06-29 DIAGNOSIS — I714 Abdominal aortic aneurysm, without rupture: Secondary | ICD-10-CM | POA: Diagnosis not present

## 2020-07-08 ENCOUNTER — Other Ambulatory Visit: Payer: Self-pay

## 2020-07-09 ENCOUNTER — Other Ambulatory Visit: Payer: Self-pay | Admitting: Cardiology

## 2020-07-09 ENCOUNTER — Other Ambulatory Visit: Payer: Self-pay | Admitting: Family Medicine

## 2020-07-09 ENCOUNTER — Ambulatory Visit: Payer: Medicare HMO | Admitting: Family Medicine

## 2020-07-09 DIAGNOSIS — I10 Essential (primary) hypertension: Secondary | ICD-10-CM

## 2020-07-12 ENCOUNTER — Encounter: Payer: Self-pay | Admitting: *Deleted

## 2020-07-13 ENCOUNTER — Telehealth: Payer: Self-pay | Admitting: *Deleted

## 2020-07-13 DIAGNOSIS — E440 Moderate protein-calorie malnutrition: Secondary | ICD-10-CM

## 2020-07-13 DIAGNOSIS — M1611 Unilateral primary osteoarthritis, right hip: Secondary | ICD-10-CM

## 2020-07-13 DIAGNOSIS — R188 Other ascites: Secondary | ICD-10-CM

## 2020-07-13 DIAGNOSIS — K7460 Unspecified cirrhosis of liver: Secondary | ICD-10-CM

## 2020-07-13 DIAGNOSIS — R627 Adult failure to thrive: Secondary | ICD-10-CM

## 2020-07-13 DIAGNOSIS — J449 Chronic obstructive pulmonary disease, unspecified: Secondary | ICD-10-CM

## 2020-07-13 DIAGNOSIS — R296 Repeated falls: Secondary | ICD-10-CM

## 2020-07-13 DIAGNOSIS — G894 Chronic pain syndrome: Secondary | ICD-10-CM
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
## 2020-07-14 ENCOUNTER — Emergency Department (HOSPITAL_COMMUNITY): Payer: Medicare HMO

## 2020-07-14 ENCOUNTER — Emergency Department (HOSPITAL_COMMUNITY)
Admission: EM | Admit: 2020-07-14 | Discharge: 2020-07-14 | Disposition: A | Discharge status: Home / Self Care | Payer: Medicare HMO | Attending: Emergency Medicine | Admitting: Emergency Medicine

## 2020-07-14 ENCOUNTER — Telehealth: Payer: Self-pay | Admitting: *Deleted

## 2020-07-14 ENCOUNTER — Other Ambulatory Visit: Payer: Self-pay

## 2020-07-14 ENCOUNTER — Encounter (HOSPITAL_COMMUNITY): Payer: Self-pay

## 2020-07-14 DIAGNOSIS — I2510 Atherosclerotic heart disease of native coronary artery without angina pectoris: Secondary | ICD-10-CM | POA: Diagnosis not present

## 2020-07-14 DIAGNOSIS — M2578 Osteophyte, vertebrae: Secondary | ICD-10-CM | POA: Diagnosis not present

## 2020-07-14 DIAGNOSIS — I10 Essential (primary) hypertension: Secondary | ICD-10-CM | POA: Insufficient documentation

## 2020-07-14 DIAGNOSIS — J449 Chronic obstructive pulmonary disease, unspecified: Secondary | ICD-10-CM | POA: Insufficient documentation

## 2020-07-14 DIAGNOSIS — Z79899 Other long term (current) drug therapy: Secondary | ICD-10-CM | POA: Diagnosis not present

## 2020-07-14 DIAGNOSIS — W06XXXA Fall from bed, initial encounter: Secondary | ICD-10-CM | POA: Insufficient documentation

## 2020-07-14 DIAGNOSIS — S0990XA Unspecified injury of head, initial encounter: Secondary | ICD-10-CM | POA: Diagnosis not present

## 2020-07-14 DIAGNOSIS — E119 Type 2 diabetes mellitus without complications: Secondary | ICD-10-CM | POA: Insufficient documentation

## 2020-07-14 DIAGNOSIS — Y92009 Unspecified place in unspecified non-institutional (private) residence as the place of occurrence of the external cause: Secondary | ICD-10-CM | POA: Insufficient documentation

## 2020-07-14 DIAGNOSIS — Z87891 Personal history of nicotine dependence: Secondary | ICD-10-CM | POA: Insufficient documentation

## 2020-07-14 DIAGNOSIS — Z7982 Long term (current) use of aspirin: Secondary | ICD-10-CM | POA: Diagnosis not present

## 2020-07-14 DIAGNOSIS — R069 Unspecified abnormalities of breathing: Secondary | ICD-10-CM | POA: Diagnosis not present

## 2020-07-14 DIAGNOSIS — R0602 Shortness of breath: Secondary | ICD-10-CM | POA: Diagnosis not present

## 2020-07-14 DIAGNOSIS — M4804 Spinal stenosis, thoracic region: Secondary | ICD-10-CM | POA: Diagnosis not present

## 2020-07-14 DIAGNOSIS — W19XXXA Unspecified fall, initial encounter: Secondary | ICD-10-CM | POA: Diagnosis not present

## 2020-07-14 DIAGNOSIS — S301XXA Contusion of abdominal wall, initial encounter: Secondary | ICD-10-CM | POA: Insufficient documentation

## 2020-07-14 DIAGNOSIS — M5124 Other intervertebral disc displacement, thoracic region: Secondary | ICD-10-CM | POA: Diagnosis not present

## 2020-07-14 DIAGNOSIS — I672 Cerebral atherosclerosis: Secondary | ICD-10-CM | POA: Diagnosis not present

## 2020-07-14 DIAGNOSIS — R52 Pain, unspecified: Secondary | ICD-10-CM | POA: Diagnosis not present

## 2020-07-14 DIAGNOSIS — S3991XA Unspecified injury of abdomen, initial encounter: Secondary | ICD-10-CM | POA: Diagnosis not present

## 2020-07-14 DIAGNOSIS — M47814 Spondylosis without myelopathy or radiculopathy, thoracic region: Secondary | ICD-10-CM | POA: Diagnosis not present

## 2020-07-14 DIAGNOSIS — M542 Cervicalgia: Secondary | ICD-10-CM | POA: Diagnosis not present

## 2020-07-14 DIAGNOSIS — T1490XA Injury, unspecified, initial encounter: Secondary | ICD-10-CM

## 2020-07-14 DIAGNOSIS — S299XXA Unspecified injury of thorax, initial encounter: Secondary | ICD-10-CM | POA: Diagnosis not present

## 2020-07-14 LAB — BASIC METABOLIC PANEL
Anion gap: 11 (ref 5–15)
BUN: 10 mg/dL (ref 8–23)
CO2: 23 mmol/L (ref 22–32)
Calcium: 8.6 mg/dL — ABNORMAL LOW (ref 8.9–10.3)
Chloride: 108 mmol/L (ref 98–111)
Creatinine, Ser: 0.83 mg/dL (ref 0.61–1.24)
GFR, Estimated: >60 mL/min (ref >60)
Glucose, Bld: 106 mg/dL — ABNORMAL HIGH (ref 70–99)
Potassium: 4.2 mmol/L (ref 3.5–5.1)
Sodium: 142 mmol/L (ref 135–145)

## 2020-07-14 LAB — CBC
HCT: 36.5 % — ABNORMAL LOW (ref 39.0–52.0)
Hemoglobin: 12.2 g/dL — ABNORMAL LOW (ref 13.0–17.0)
MCH: 35.4 pg — ABNORMAL HIGH (ref 26.0–34.0)
MCHC: 33.4 g/dL (ref 30.0–36.0)
MCV: 105.8 fL — ABNORMAL HIGH (ref 80.0–100.0)
Platelets: 37 10*3/uL — ABNORMAL LOW (ref 150–400)
RBC: 3.45 MIL/uL — ABNORMAL LOW (ref 4.22–5.81)
RDW: 14.6 % (ref 11.5–15.5)
WBC: 3.4 10*3/uL — ABNORMAL LOW (ref 4.0–10.5)
nRBC: 0 % (ref 0.0–0.2)

## 2020-07-14 MED ORDER — MORPHINE SULFATE (PF) 4 MG/ML IV SOLN
4.0000 mg | Freq: Once | INTRAVENOUS | Status: AC
  Administered 2020-07-14: 4 mg via INTRAVENOUS
  Filled 2020-07-14: qty 1

## 2020-07-14 MED ORDER — PREDNISONE 20 MG PO TABS
60.0000 mg | ORAL_TABLET | Freq: Once | ORAL | Status: AC
  Administered 2020-07-14: 60 mg via ORAL
  Filled 2020-07-14: qty 3

## 2020-07-14 MED ORDER — IOHEXOL 300 MG/ML  SOLN
100.0000 mL | Freq: Once | INTRAMUSCULAR | Status: AC | PRN
  Administered 2020-07-14: 100 mL via INTRAVENOUS

## 2020-07-14 MED ORDER — ONDANSETRON HCL 4 MG/2ML IJ SOLN
4.0000 mg | Freq: Once | INTRAMUSCULAR | Status: AC
  Administered 2020-07-14: 4 mg via INTRAVENOUS
  Filled 2020-07-14: qty 2

## 2020-07-14 MED ORDER — ALBUTEROL SULFATE HFA 108 (90 BASE) MCG/ACT IN AERS
4.0000 | INHALATION_SPRAY | Freq: Once | RESPIRATORY_TRACT | Status: DC
  Filled 2020-07-14: qty 6.7

## 2020-07-14 NOTE — Discharge Instructions (Signed)
Imaging did not show any internal injuries.  Follow up with your primary care doctor for further evaluation of your pain and for symptom recheck.  Return to emergency department for any new or worsening symptoms

## 2020-07-14 NOTE — ED Notes (Signed)
E-signature pad unavailable at time of pt discharge. This RN discussed discharge materials with pt and answered all pt questions. Pt stated understanding of discharge material. ? ?

## 2020-07-14 NOTE — Telephone Encounter (Addendum)
Patient's wife notified as instructed by telephone and verbalized understanding.
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
## 2020-07-14 NOTE — ED Notes (Signed)
This RN went to help with another pt. Another RN named Gwyndolyn Saxon came in to administer medications but did not pay attention that this RN was still signed in and gave medications under my name. This RN did not administer prednisone, morphine or zofran to pt. Charge RN aware, note made. Will continue to monitor pt.

## 2020-07-14 NOTE — ED Notes (Signed)
Patient asked this tech how many people
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
## 2020-07-14 NOTE — Telephone Encounter (Signed)
Patient's wife notified as instructed by telephone and verbalized understanding. See phone note dated today 07/1920.
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
## 2020-07-14 NOTE — ED Triage Notes (Addendum)
Patient arrived by [facility] complaining of SOB following fall yesterday at home. Bruising noted to right side, no loc. Patient with minimal ambulation at home. Patient in distress, RA sats 97-98%
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
## 2020-07-15 ENCOUNTER — Telehealth: Payer: Self-pay

## 2020-07-15 ENCOUNTER — Other Ambulatory Visit: Payer: Self-pay

## 2020-07-15 DIAGNOSIS — Z79891 Long term (current) use of opiate analgesic: Secondary | ICD-10-CM | POA: Diagnosis not present

## 2020-07-15 DIAGNOSIS — M25551 Pain in right hip: Secondary | ICD-10-CM | POA: Diagnosis not present

## 2020-07-15 DIAGNOSIS — M25511 Pain in right shoulder: Secondary | ICD-10-CM | POA: Diagnosis not present

## 2020-07-15 DIAGNOSIS — G894 Chronic pain syndrome: Secondary | ICD-10-CM | POA: Diagnosis not present

## 2020-07-15 DIAGNOSIS — M542 Cervicalgia: Secondary | ICD-10-CM | POA: Diagnosis not present

## 2020-07-15 DIAGNOSIS — G600 Hereditary motor and sensory neuropathy: Secondary | ICD-10-CM | POA: Diagnosis not present
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
## 2020-07-16 NOTE — Telephone Encounter (Signed)
Will see then.  HH needs F2F visit prior to starting services.

## 2020-07-17 ENCOUNTER — Other Ambulatory Visit: Payer: Self-pay | Admitting: Family Medicine

## 2020-07-17 DIAGNOSIS — I429 Cardiomyopathy, unspecified: Secondary | ICD-10-CM | POA: Diagnosis not present

## 2020-07-17 DIAGNOSIS — J449 Chronic obstructive pulmonary disease, unspecified: Secondary | ICD-10-CM | POA: Diagnosis not present

## 2020-07-17 DIAGNOSIS — I10 Essential (primary) hypertension: Secondary | ICD-10-CM | POA: Diagnosis not present

## 2020-07-17 DIAGNOSIS — E119 Type 2 diabetes mellitus without complications: Secondary | ICD-10-CM | POA: Diagnosis not present

## 2020-07-17 DIAGNOSIS — M1611 Unilateral primary osteoarthritis, right hip: Secondary | ICD-10-CM | POA: Diagnosis not present

## 2020-07-17 DIAGNOSIS — M47812 Spondylosis without myelopathy or radiculopathy, cervical region: Secondary | ICD-10-CM | POA: Diagnosis not present

## 2020-07-17 DIAGNOSIS — M109 Gout, unspecified: Secondary | ICD-10-CM | POA: Diagnosis not present

## 2020-07-17 DIAGNOSIS — L89312 Pressure ulcer of right buttock, stage 2: Secondary | ICD-10-CM | POA: Diagnosis not present

## 2020-07-17 DIAGNOSIS — I2510 Atherosclerotic heart disease of native coronary artery without angina pectoris: Secondary | ICD-10-CM | POA: Diagnosis not present

## 2020-07-20 DIAGNOSIS — E119 Type 2 diabetes mellitus without complications: Secondary | ICD-10-CM | POA: Diagnosis not present

## 2020-07-20 DIAGNOSIS — I10 Essential (primary) hypertension: Secondary | ICD-10-CM | POA: Diagnosis not present

## 2020-07-20 DIAGNOSIS — M47812 Spondylosis without myelopathy or radiculopathy, cervical region: Secondary | ICD-10-CM | POA: Diagnosis not present

## 2020-07-20 DIAGNOSIS — J449 Chronic obstructive pulmonary disease, unspecified: Secondary | ICD-10-CM | POA: Diagnosis not present

## 2020-07-20 DIAGNOSIS — M1611 Unilateral primary osteoarthritis, right hip: Secondary | ICD-10-CM | POA: Diagnosis not present

## 2020-07-20 DIAGNOSIS — L89312 Pressure ulcer of right buttock, stage 2: Secondary | ICD-10-CM | POA: Diagnosis not present

## 2020-07-20 DIAGNOSIS — M109 Gout, unspecified: Secondary | ICD-10-CM | POA: Diagnosis not present

## 2020-07-20 DIAGNOSIS — I429 Cardiomyopathy, unspecified: Secondary | ICD-10-CM | POA: Diagnosis not present

## 2020-07-20 DIAGNOSIS — I2510 Atherosclerotic heart disease of native coronary artery without angina pectoris: Secondary | ICD-10-CM | POA: Diagnosis not present

## 2020-07-21 ENCOUNTER — Encounter (HOSPITAL_BASED_OUTPATIENT_CLINIC_OR_DEPARTMENT_OTHER): Payer: Medicare HMO | Admitting: Physician Assistant

## 2020-07-21 ENCOUNTER — Telehealth (INDEPENDENT_AMBULATORY_CARE_PROVIDER_SITE_OTHER): Payer: Medicare HMO | Admitting: Family Medicine

## 2020-07-21 ENCOUNTER — Encounter: Payer: Self-pay | Admitting: Family Medicine

## 2020-07-21 VITALS — BP 128/66 | HR 72 | Temp 98.0°F | Ht 69.0 in | Wt 280.0 lb

## 2020-07-21 DIAGNOSIS — R188 Other ascites: Secondary | ICD-10-CM

## 2020-07-21 DIAGNOSIS — F17200 Nicotine dependence, unspecified, uncomplicated: Secondary | ICD-10-CM | POA: Diagnosis not present

## 2020-07-21 DIAGNOSIS — G894 Chronic pain syndrome: Secondary | ICD-10-CM

## 2020-07-21 DIAGNOSIS — R296 Repeated falls: Secondary | ICD-10-CM | POA: Diagnosis not present

## 2020-07-21 DIAGNOSIS — D696 Thrombocytopenia, unspecified: Secondary | ICD-10-CM | POA: Diagnosis not present

## 2020-07-21 DIAGNOSIS — J449 Chronic obstructive pulmonary disease, unspecified: Secondary | ICD-10-CM

## 2020-07-21 DIAGNOSIS — R5382 Chronic fatigue, unspecified: Secondary | ICD-10-CM

## 2020-07-21 DIAGNOSIS — K7460 Unspecified cirrhosis of liver: Secondary | ICD-10-CM | POA: Diagnosis not present

## 2020-07-21 DIAGNOSIS — E785 Hyperlipidemia, unspecified: Secondary | ICD-10-CM | POA: Diagnosis not present

## 2020-07-21 DIAGNOSIS — D61818 Other pancytopenia: Secondary | ICD-10-CM

## 2020-07-21 DIAGNOSIS — Z6982 Encounter for mental health services for perpetrator of other abuse: Secondary | ICD-10-CM

## 2020-07-21 MED ORDER — NICOTINE 14 MG/24HR TD PT24: 14.0000 mg | MEDICATED_PATCH | Freq: Every day | TRANSDERMAL | 0 refills | Status: DC

## 2020-07-21 NOTE — Assessment & Plan Note (Addendum)
Fall with injury. Reviewed ER records. Reassuring imaging studies. Imaging showed R anterior chest wall contusion and hematoma. Anticipate will heal well. Increased fall risk due to debility, chronic pain, and generalized weakness. Will refer to [facility] for PT and skilled nursing as well as social work eval.

## 2020-07-21 NOTE — Assessment & Plan Note (Signed)
Marked - planned Avatrombopag through GI prior to surgery.

## 2020-07-21 NOTE — Assessment & Plan Note (Signed)
Continued 1 ppd smoker. Interested in nicoderm [place] - Rx sent to pharmacy. Has used these successfully previously.

## 2020-07-21 NOTE — Assessment & Plan Note (Signed)
Continue pain clinic f/u.

## 2020-07-21 NOTE — Assessment & Plan Note (Signed)
NASH cirrhosis - needs Avatrombopag through GI prior to hip replacement. Wife will call Dr Georgeann Oppenheim office today to coordinate this.

## 2020-07-21 NOTE — Assessment & Plan Note (Signed)
Received prednisone 37m dose x1 in ER.  Currently without increased work of breathing.  Will continue regular regimen of albuterol PRN.
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
## 2020-07-21 NOTE — Assessment & Plan Note (Signed)
Only on RYR 1229m daily. Stopped zetia due to worsening leg pains. Wife will call cards today to notify them of this.
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
## 2020-07-22 DIAGNOSIS — M47812 Spondylosis without myelopathy or radiculopathy, cervical region: Secondary | ICD-10-CM | POA: Diagnosis not present

## 2020-07-22 DIAGNOSIS — J449 Chronic obstructive pulmonary disease, unspecified: Secondary | ICD-10-CM | POA: Diagnosis not present

## 2020-07-22 DIAGNOSIS — M1611 Unilateral primary osteoarthritis, right hip: Secondary | ICD-10-CM | POA: Diagnosis not present

## 2020-07-22 DIAGNOSIS — I10 Essential (primary) hypertension: Secondary | ICD-10-CM | POA: Diagnosis not present

## 2020-07-22 DIAGNOSIS — E119 Type 2 diabetes mellitus without complications: Secondary | ICD-10-CM | POA: Diagnosis not present

## 2020-07-22 DIAGNOSIS — M109 Gout, unspecified: Secondary | ICD-10-CM | POA: Diagnosis not present

## 2020-07-22 DIAGNOSIS — L89312 Pressure ulcer of right buttock, stage 2: Secondary | ICD-10-CM | POA: Diagnosis not present

## 2020-07-22 DIAGNOSIS — I429 Cardiomyopathy, unspecified: Secondary | ICD-10-CM | POA: Diagnosis not present

## 2020-07-22 DIAGNOSIS — I2510 Atherosclerotic heart disease of native coronary artery without angina pectoris: Secondary | ICD-10-CM | POA: Diagnosis not present

## 2020-07-23 DIAGNOSIS — Z6835 Body mass index (BMI) 35.0-35.9, adult: Secondary | ICD-10-CM

## 2020-07-23 DIAGNOSIS — Z7982 Long term (current) use of aspirin: Secondary | ICD-10-CM

## 2020-07-23 DIAGNOSIS — I10 Essential (primary) hypertension: Secondary | ICD-10-CM | POA: Diagnosis not present

## 2020-07-23 DIAGNOSIS — Z7951 Long term (current) use of inhaled steroids: Secondary | ICD-10-CM

## 2020-07-23 DIAGNOSIS — Z951 Presence of aortocoronary bypass graft: Secondary | ICD-10-CM

## 2020-07-23 DIAGNOSIS — K7460 Unspecified cirrhosis of liver: Secondary | ICD-10-CM

## 2020-07-23 DIAGNOSIS — E669 Obesity, unspecified: Secondary | ICD-10-CM

## 2020-07-23 DIAGNOSIS — J449 Chronic obstructive pulmonary disease, unspecified: Secondary | ICD-10-CM | POA: Diagnosis not present

## 2020-07-23 DIAGNOSIS — Z9181 History of falling: Secondary | ICD-10-CM

## 2020-07-23 DIAGNOSIS — E559 Vitamin D deficiency, unspecified: Secondary | ICD-10-CM

## 2020-07-23 DIAGNOSIS — I2510 Atherosclerotic heart disease of native coronary artery without angina pectoris: Secondary | ICD-10-CM | POA: Diagnosis not present

## 2020-07-23 DIAGNOSIS — M1611 Unilateral primary osteoarthritis, right hip: Secondary | ICD-10-CM | POA: Diagnosis not present

## 2020-07-23 DIAGNOSIS — M109 Gout, unspecified: Secondary | ICD-10-CM | POA: Diagnosis not present

## 2020-07-23 DIAGNOSIS — Z79891 Long term (current) use of opiate analgesic: Secondary | ICD-10-CM

## 2020-07-23 DIAGNOSIS — L89312 Pressure ulcer of right buttock, stage 2: Secondary | ICD-10-CM | POA: Diagnosis not present

## 2020-07-23 DIAGNOSIS — I429 Cardiomyopathy, unspecified: Secondary | ICD-10-CM | POA: Diagnosis not present

## 2020-07-23 DIAGNOSIS — G4733 Obstructive sleep apnea (adult) (pediatric): Secondary | ICD-10-CM

## 2020-07-23 DIAGNOSIS — G894 Chronic pain syndrome: Secondary | ICD-10-CM

## 2020-07-23 DIAGNOSIS — Z87891 Personal history of nicotine dependence: Secondary | ICD-10-CM

## 2020-07-23 DIAGNOSIS — E785 Hyperlipidemia, unspecified: Secondary | ICD-10-CM

## 2020-07-23 DIAGNOSIS — M47812 Spondylosis without myelopathy or radiculopathy, cervical region: Secondary | ICD-10-CM | POA: Diagnosis not present

## 2020-07-23 DIAGNOSIS — E440 Moderate protein-calorie malnutrition: Secondary | ICD-10-CM

## 2020-07-23 DIAGNOSIS — F329 Major depressive disorder, single episode, unspecified: Secondary | ICD-10-CM

## 2020-07-23 DIAGNOSIS — E119 Type 2 diabetes mellitus without complications: Secondary | ICD-10-CM | POA: Diagnosis not present

## 2020-07-23 DIAGNOSIS — I252 Old myocardial infarction: Secondary | ICD-10-CM

## 2020-07-24 ENCOUNTER — Other Ambulatory Visit: Payer: Self-pay | Admitting: Family Medicine

## 2020-07-24 DIAGNOSIS — E538 Deficiency of other specified B group vitamins: Secondary | ICD-10-CM

## 2020-07-24 DIAGNOSIS — E785 Hyperlipidemia, unspecified: Secondary | ICD-10-CM

## 2020-07-24 DIAGNOSIS — R7303 Prediabetes: Secondary | ICD-10-CM

## 2020-07-24 DIAGNOSIS — Z125 Encounter for screening for malignant neoplasm of prostate: Secondary | ICD-10-CM

## 2020-07-24 DIAGNOSIS — M1A9XX0 Chronic gout, unspecified, without tophus (tophi): Secondary | ICD-10-CM

## 2020-07-24 DIAGNOSIS — D61818 Other pancytopenia: Secondary | ICD-10-CM

## 2020-07-24 DIAGNOSIS — K7460 Unspecified cirrhosis of liver: Secondary | ICD-10-CM

## 2020-07-24 DIAGNOSIS — E559 Vitamin D deficiency, unspecified: Secondary | ICD-10-CM

## 2020-07-24 DIAGNOSIS — R1011 Right upper quadrant pain: Secondary | ICD-10-CM

## 2020-07-26 DIAGNOSIS — I429 Cardiomyopathy, unspecified: Secondary | ICD-10-CM | POA: Diagnosis not present

## 2020-07-26 DIAGNOSIS — I10 Essential (primary) hypertension: Secondary | ICD-10-CM | POA: Diagnosis not present

## 2020-07-26 DIAGNOSIS — M1611 Unilateral primary osteoarthritis, right hip: Secondary | ICD-10-CM | POA: Diagnosis not present

## 2020-07-26 DIAGNOSIS — E119 Type 2 diabetes mellitus without complications: Secondary | ICD-10-CM | POA: Diagnosis not present

## 2020-07-26 DIAGNOSIS — J449 Chronic obstructive pulmonary disease, unspecified: Secondary | ICD-10-CM | POA: Diagnosis not present

## 2020-07-26 DIAGNOSIS — G600 Hereditary motor and sensory neuropathy: Secondary | ICD-10-CM | POA: Diagnosis not present

## 2020-07-26 DIAGNOSIS — M47812 Spondylosis without myelopathy or radiculopathy, cervical region: Secondary | ICD-10-CM | POA: Diagnosis not present

## 2020-07-26 DIAGNOSIS — I2510 Atherosclerotic heart disease of native coronary artery without angina pectoris: Secondary | ICD-10-CM | POA: Diagnosis not present

## 2020-07-26 DIAGNOSIS — M109 Gout, unspecified: Secondary | ICD-10-CM | POA: Diagnosis not present

## 2020-07-26 DIAGNOSIS — L89312 Pressure ulcer of right buttock, stage 2: Secondary | ICD-10-CM | POA: Diagnosis not present

## 2020-07-27 ENCOUNTER — Other Ambulatory Visit: Payer: Medicare HMO

## 2020-07-27 ENCOUNTER — Telehealth: Payer: Self-pay | Admitting: Gastroenterology

## 2020-07-27 DIAGNOSIS — M109 Gout, unspecified: Secondary | ICD-10-CM | POA: Diagnosis not present

## 2020-07-27 DIAGNOSIS — I10 Essential (primary) hypertension: Secondary | ICD-10-CM | POA: Diagnosis not present

## 2020-07-27 DIAGNOSIS — I2510 Atherosclerotic heart disease of native coronary artery without angina pectoris: Secondary | ICD-10-CM | POA: Diagnosis not present

## 2020-07-27 DIAGNOSIS — L89312 Pressure ulcer of right buttock, stage 2: Secondary | ICD-10-CM | POA: Diagnosis not present

## 2020-07-27 DIAGNOSIS — J449 Chronic obstructive pulmonary disease, unspecified: Secondary | ICD-10-CM | POA: Diagnosis not present

## 2020-07-27 DIAGNOSIS — E119 Type 2 diabetes mellitus without complications: Secondary | ICD-10-CM | POA: Diagnosis not present

## 2020-07-27 DIAGNOSIS — I429 Cardiomyopathy, unspecified: Secondary | ICD-10-CM | POA: Diagnosis not present

## 2020-07-27 DIAGNOSIS — M47812 Spondylosis without myelopathy or radiculopathy, cervical region: Secondary | ICD-10-CM | POA: Diagnosis not present

## 2020-07-27 DIAGNOSIS — M1611 Unilateral primary osteoarthritis, right hip: Secondary | ICD-10-CM | POA: Diagnosis not present

## 2020-07-27 NOTE — Telephone Encounter (Signed)
Patient having hip replacement September 06, 2020. Patient is asking for appointment before his surgery, please advise

## 2020-07-28 ENCOUNTER — Telehealth: Payer: Self-pay

## 2020-07-28 ENCOUNTER — Other Ambulatory Visit: Payer: Self-pay

## 2020-07-28 ENCOUNTER — Ambulatory Visit: Payer: Medicare HMO

## 2020-07-28 NOTE — Telephone Encounter (Signed)
Called patient several times trying to complete his AWV. Patient never answered the phone. Left message notifying patient that I called and he can call to reschedule or provider might complete at his upcoming physical. Appointment cancelled.

## 2020-07-29 DIAGNOSIS — I429 Cardiomyopathy, unspecified: Secondary | ICD-10-CM | POA: Diagnosis not present

## 2020-07-29 DIAGNOSIS — M109 Gout, unspecified: Secondary | ICD-10-CM | POA: Diagnosis not present

## 2020-07-29 DIAGNOSIS — I10 Essential (primary) hypertension: Secondary | ICD-10-CM | POA: Diagnosis not present

## 2020-07-29 DIAGNOSIS — M47812 Spondylosis without myelopathy or radiculopathy, cervical region: Secondary | ICD-10-CM | POA: Diagnosis not present

## 2020-07-29 DIAGNOSIS — J449 Chronic obstructive pulmonary disease, unspecified: Secondary | ICD-10-CM | POA: Diagnosis not present

## 2020-07-29 DIAGNOSIS — M1611 Unilateral primary osteoarthritis, right hip: Secondary | ICD-10-CM | POA: Diagnosis not present

## 2020-07-29 DIAGNOSIS — L89312 Pressure ulcer of right buttock, stage 2: Secondary | ICD-10-CM | POA: Diagnosis not present

## 2020-07-29 DIAGNOSIS — E119 Type 2 diabetes mellitus without complications: Secondary | ICD-10-CM | POA: Diagnosis not present

## 2020-07-29 DIAGNOSIS — I2510 Atherosclerotic heart disease of native coronary artery without angina pectoris: Secondary | ICD-10-CM | POA: Diagnosis not present

## 2020-07-30 ENCOUNTER — Other Ambulatory Visit (INDEPENDENT_AMBULATORY_CARE_PROVIDER_SITE_OTHER): Payer: Medicare HMO

## 2020-07-30 ENCOUNTER — Other Ambulatory Visit: Payer: Self-pay

## 2020-07-30 ENCOUNTER — Ambulatory Visit: Payer: Medicare HMO | Admitting: Family Medicine

## 2020-07-30 ENCOUNTER — Telehealth: Payer: Self-pay | Admitting: Radiology

## 2020-07-30 DIAGNOSIS — E785 Hyperlipidemia, unspecified: Secondary | ICD-10-CM | POA: Diagnosis not present

## 2020-07-30 DIAGNOSIS — K7460 Unspecified cirrhosis of liver: Secondary | ICD-10-CM | POA: Diagnosis not present

## 2020-07-30 DIAGNOSIS — R188 Other ascites: Secondary | ICD-10-CM | POA: Diagnosis not present

## 2020-07-30 DIAGNOSIS — I429 Cardiomyopathy, unspecified: Secondary | ICD-10-CM | POA: Diagnosis not present

## 2020-07-30 DIAGNOSIS — E538 Deficiency of other specified B group vitamins: Secondary | ICD-10-CM | POA: Diagnosis not present

## 2020-07-30 DIAGNOSIS — J449 Chronic obstructive pulmonary disease, unspecified: Secondary | ICD-10-CM | POA: Diagnosis not present

## 2020-07-30 DIAGNOSIS — D61818 Other pancytopenia: Secondary | ICD-10-CM | POA: Diagnosis not present

## 2020-07-30 DIAGNOSIS — R1011 Right upper quadrant pain: Secondary | ICD-10-CM | POA: Diagnosis not present

## 2020-07-30 DIAGNOSIS — E559 Vitamin D deficiency, unspecified: Secondary | ICD-10-CM

## 2020-07-30 DIAGNOSIS — M1A9XX0 Chronic gout, unspecified, without tophus (tophi): Secondary | ICD-10-CM

## 2020-07-30 DIAGNOSIS — M1611 Unilateral primary osteoarthritis, right hip: Secondary | ICD-10-CM | POA: Diagnosis not present

## 2020-07-30 DIAGNOSIS — M47812 Spondylosis without myelopathy or radiculopathy, cervical region: Secondary | ICD-10-CM | POA: Diagnosis not present

## 2020-07-30 DIAGNOSIS — L89312 Pressure ulcer of right buttock, stage 2: Secondary | ICD-10-CM | POA: Diagnosis not present

## 2020-07-30 DIAGNOSIS — R7303 Prediabetes: Secondary | ICD-10-CM | POA: Diagnosis not present

## 2020-07-30 DIAGNOSIS — I10 Essential (primary) hypertension: Secondary | ICD-10-CM | POA: Diagnosis not present

## 2020-07-30 DIAGNOSIS — M109 Gout, unspecified: Secondary | ICD-10-CM | POA: Diagnosis not present

## 2020-07-30 DIAGNOSIS — E119 Type 2 diabetes mellitus without complications: Secondary | ICD-10-CM | POA: Diagnosis not present

## 2020-07-30 DIAGNOSIS — I2510 Atherosclerotic heart disease of native coronary artery without angina pectoris: Secondary | ICD-10-CM | POA: Diagnosis not present

## 2020-07-30 LAB — CBC WITH DIFFERENTIAL/PLATELET
Basophils Absolute: 0 10*3/uL (ref 0.0–0.1)
Basophils Relative: 0.7 % (ref 0.0–3.0)
Eosinophils Absolute: 0.2 10*3/uL (ref 0.0–0.7)
Eosinophils Relative: 3.3 % (ref 0.0–5.0)
HCT: 38.3 % — ABNORMAL LOW (ref 39.0–52.0)
Hemoglobin: 13.1 g/dL (ref 13.0–17.0)
Lymphocytes Relative: 18.9 % (ref 12.0–46.0)
Lymphs Abs: 0.9 10*3/uL (ref 0.7–4.0)
MCHC: 34.1 g/dL (ref 30.0–36.0)
MCV: 105.2 fl — ABNORMAL HIGH (ref 78.0–100.0)
Monocytes Absolute: 0.5 10*3/uL (ref 0.1–1.0)
Monocytes Relative: 9.7 % (ref 3.0–12.0)
Neutro Abs: 3.2 10*3/uL (ref 1.4–7.7)
Neutrophils Relative %: 67.4 % (ref 43.0–77.0)
Platelets: 48 10*3/uL — CL (ref 150.0–400.0)
RBC: 3.64 Mil/uL — ABNORMAL LOW (ref 4.22–5.81)
RDW: 15.3 % (ref 11.5–15.5)
WBC: 4.8 10*3/uL (ref 4.0–10.5)

## 2020-07-30 LAB — LIPID PANEL
Cholesterol: 143 mg/dL (ref 0–200)
HDL: 38 mg/dL — ABNORMAL LOW (ref >39.00)
LDL Cholesterol: 88 mg/dL (ref 0–99)
NonHDL: 105.13
Total CHOL/HDL Ratio: 4
Triglycerides: 87 mg/dL (ref 0.0–149.0)
VLDL: 17.4 mg/dL (ref 0.0–40.0)

## 2020-07-30 LAB — PROTIME-INR
INR: 1.3 ratio — ABNORMAL HIGH (ref 0.8–1.0)
Prothrombin Time: 14.1 s — ABNORMAL HIGH (ref 9.6–13.1)

## 2020-07-30 LAB — COMPREHENSIVE METABOLIC PANEL
ALT: 14 U/L (ref 0–53)
AST: 26 U/L (ref 0–37)
Albumin: 2.9 g/dL — ABNORMAL LOW (ref 3.5–5.2)
Alkaline Phosphatase: 168 U/L — ABNORMAL HIGH (ref 39–117)
BUN: 16 mg/dL (ref 6–23)
CO2: 29 mEq/L (ref 19–32)
Calcium: 8.4 mg/dL (ref 8.4–10.5)
Chloride: 100 mEq/L (ref 96–112)
Creatinine, Ser: 0.88 mg/dL (ref 0.40–1.50)
GFR: 90.3 mL/min (ref >60.00)
Glucose, Bld: 108 mg/dL — ABNORMAL HIGH (ref 70–99)
Potassium: 4.6 mEq/L (ref 3.5–5.1)
Sodium: 134 mEq/L — ABNORMAL LOW (ref 135–145)
Total Bilirubin: 1.4 mg/dL — ABNORMAL HIGH (ref 0.2–1.2)
Total Protein: 7.2 g/dL (ref 6.0–8.3)

## 2020-07-30 LAB — VITAMIN B12: Vitamin B-12: 926 pg/mL — ABNORMAL HIGH (ref 211–911)

## 2020-07-30 LAB — TSH: TSH: 1.65 u[IU]/mL (ref 0.35–4.50)

## 2020-07-30 LAB — URIC ACID: Uric Acid, Serum: 6.1 mg/dL (ref 4.0–7.8)

## 2020-07-30 LAB — HEMOGLOBIN A1C: Hgb A1c MFr Bld: 4.9 % (ref 4.6–6.5)

## 2020-07-30 LAB — VITAMIN D 25 HYDROXY (VIT D DEFICIENCY, FRACTURES): VITD: 25.65 ng/mL — ABNORMAL LOW (ref 30.00–100.00)

## 2020-07-30 LAB — LIPASE: Lipase: 43 U/L (ref 11.0–59.0)

## 2020-07-30 NOTE — Telephone Encounter (Signed)
[facility] lab called a critical platelet count, 48.000. Results given to Dr Danise Mina

## 2020-08-02 DIAGNOSIS — J449 Chronic obstructive pulmonary disease, unspecified: Secondary | ICD-10-CM | POA: Diagnosis not present

## 2020-08-02 DIAGNOSIS — L89312 Pressure ulcer of right buttock, stage 2: Secondary | ICD-10-CM | POA: Diagnosis not present

## 2020-08-02 DIAGNOSIS — I429 Cardiomyopathy, unspecified: Secondary | ICD-10-CM | POA: Diagnosis not present

## 2020-08-02 DIAGNOSIS — M109 Gout, unspecified: Secondary | ICD-10-CM | POA: Diagnosis not present

## 2020-08-02 DIAGNOSIS — I10 Essential (primary) hypertension: Secondary | ICD-10-CM | POA: Diagnosis not present

## 2020-08-02 DIAGNOSIS — M47812 Spondylosis without myelopathy or radiculopathy, cervical region: Secondary | ICD-10-CM | POA: Diagnosis not present

## 2020-08-02 DIAGNOSIS — I2510 Atherosclerotic heart disease of native coronary artery without angina pectoris: Secondary | ICD-10-CM | POA: Diagnosis not present

## 2020-08-02 DIAGNOSIS — E119 Type 2 diabetes mellitus without complications: Secondary | ICD-10-CM | POA: Diagnosis not present

## 2020-08-02 DIAGNOSIS — M1611 Unilateral primary osteoarthritis, right hip: Secondary | ICD-10-CM | POA: Diagnosis not present

## 2020-08-03 ENCOUNTER — Ambulatory Visit (INDEPENDENT_AMBULATORY_CARE_PROVIDER_SITE_OTHER): Payer: Medicare HMO | Admitting: Family Medicine

## 2020-08-03 ENCOUNTER — Other Ambulatory Visit: Payer: Self-pay

## 2020-08-03 VITALS — BP 150/90 | HR 91 | Temp 97.8°F | Ht 67.25 in | Wt 274.1 lb

## 2020-08-03 DIAGNOSIS — I2510 Atherosclerotic heart disease of native coronary artery without angina pectoris: Secondary | ICD-10-CM | POA: Diagnosis not present

## 2020-08-03 DIAGNOSIS — G600 Hereditary motor and sensory neuropathy: Secondary | ICD-10-CM

## 2020-08-03 DIAGNOSIS — I700 Atherosclerosis of aorta: Secondary | ICD-10-CM

## 2020-08-03 DIAGNOSIS — R590 Localized enlarged lymph nodes: Secondary | ICD-10-CM | POA: Diagnosis not present

## 2020-08-03 DIAGNOSIS — M1611 Unilateral primary osteoarthritis, right hip: Secondary | ICD-10-CM

## 2020-08-03 DIAGNOSIS — F17200 Nicotine dependence, unspecified, uncomplicated: Secondary | ICD-10-CM | POA: Diagnosis not present

## 2020-08-03 DIAGNOSIS — I714 Abdominal aortic aneurysm, without rupture, unspecified: Secondary | ICD-10-CM

## 2020-08-03 DIAGNOSIS — E440 Moderate protein-calorie malnutrition: Secondary | ICD-10-CM

## 2020-08-03 DIAGNOSIS — E559 Vitamin D deficiency, unspecified: Secondary | ICD-10-CM

## 2020-08-03 DIAGNOSIS — E538 Deficiency of other specified B group vitamins: Secondary | ICD-10-CM

## 2020-08-03 DIAGNOSIS — K7460 Unspecified cirrhosis of liver: Secondary | ICD-10-CM

## 2020-08-03 DIAGNOSIS — Z23 Encounter for immunization: Secondary | ICD-10-CM | POA: Diagnosis not present

## 2020-08-03 DIAGNOSIS — E785 Hyperlipidemia, unspecified: Secondary | ICD-10-CM | POA: Diagnosis not present

## 2020-08-03 DIAGNOSIS — G894 Chronic pain syndrome: Secondary | ICD-10-CM

## 2020-08-03 DIAGNOSIS — M1A9XX0 Chronic gout, unspecified, without tophus (tophi): Secondary | ICD-10-CM

## 2020-08-03 DIAGNOSIS — Z789 Other specified health status: Secondary | ICD-10-CM | POA: Diagnosis not present

## 2020-08-03 DIAGNOSIS — Z0000 Encounter for general adult medical examination without abnormal findings: Secondary | ICD-10-CM | POA: Diagnosis not present

## 2020-08-03 DIAGNOSIS — Z7189 Other specified counseling: Secondary | ICD-10-CM

## 2020-08-03 DIAGNOSIS — I10 Essential (primary) hypertension: Secondary | ICD-10-CM | POA: Diagnosis not present

## 2020-08-03 DIAGNOSIS — R188 Other ascites: Secondary | ICD-10-CM

## 2020-08-03 DIAGNOSIS — D696 Thrombocytopenia, unspecified: Secondary | ICD-10-CM

## 2020-08-03 DIAGNOSIS — F332 Major depressive disorder, recurrent severe without psychotic features: Secondary | ICD-10-CM

## 2020-08-03 DIAGNOSIS — J449 Chronic obstructive pulmonary disease, unspecified: Secondary | ICD-10-CM

## 2020-08-03 DIAGNOSIS — R7303 Prediabetes: Secondary | ICD-10-CM

## 2020-08-03 DIAGNOSIS — Z0001 Encounter for general adult medical examination with abnormal findings: Secondary | ICD-10-CM

## 2020-08-03 DIAGNOSIS — D649 Anemia, unspecified: Secondary | ICD-10-CM

## 2020-08-03 MED ORDER — DULOXETINE HCL 60 MG PO CPEP: 60.0000 mg | ORAL_CAPSULE | Freq: Every day | ORAL | 3 refills | Status: DC

## 2020-08-03 NOTE — Patient Instructions (Addendum)
Prevnar today  If interested, check with pharmacy about new 2 shot shingles series (shingrix).  Good to see you today. I will touch base with Dr Vicente Males about upcoming hip surgery.  We will check neck CT with contrast for swollen gland.  Return in 4 months for follow up visit for further memory test.   Health Maintenance After Age 66 After age 16, you are at a higher risk for certain long-term diseases and infections as well as injuries from falls. Falls are a major cause of broken bones and head injuries in people who are older than age 77. Getting regular preventive care can help to keep you healthy and well. Preventive care includes getting regular testing and making lifestyle changes as recommended by your health care provider. Talk with your health care provider about:  Which screenings and tests you should have. A screening is a test that checks for a disease when you have no symptoms.  A diet and exercise plan that is right for you. What should I know about screenings and tests to prevent falls? Screening and testing are the best ways to find a health problem early. Early diagnosis and treatment give you the best chance of managing medical conditions that are common after age 20. Certain conditions and lifestyle choices may make you more likely to have a fall. Your health care provider may recommend:  Regular vision checks. Poor vision and conditions such as cataracts can make you more likely to have a fall. If you wear glasses, make sure to get your prescription updated if your vision changes.  Medicine review. Work with your health care provider to regularly review all of the medicines you are taking, including over-the-counter medicines. Ask your health care provider about any side effects that may make you more likely to have a fall. Tell your health care provider if any medicines that you take make you feel dizzy or sleepy.  Osteoporosis screening. Osteoporosis is a condition that causes  the bones to get weaker. This can make the bones weak and cause them to break more easily.  Blood pressure screening. Blood pressure changes and medicines to control blood pressure can make you feel dizzy.  Strength and balance checks. Your health care provider may recommend certain tests to check your strength and balance while standing, walking, or changing positions.  Foot health exam. Foot pain and numbness, as well as not wearing proper footwear, can make you more likely to have a fall.  Depression screening. You may be more likely to have a fall if you have a fear of falling, feel emotionally low, or feel unable to do activities that you used to do.  Alcohol use screening. Using too much alcohol can affect your balance and may make you more likely to have a fall. What actions can I take to lower my risk of falls? General instructions  Talk with your health care provider about your risks for falling. Tell your health care provider if: ? You fall. Be sure to tell your health care provider about all falls, even ones that seem minor. ? You feel dizzy, sleepy, or off-balance.  Take over-the-counter and prescription medicines only as told by your health care provider. These include any supplements.  Eat a healthy diet and maintain a healthy weight. A healthy diet includes low-fat dairy products, low-fat (lean) meats, and fiber from whole grains, beans, and lots of fruits and vegetables. Home safety  Remove any tripping hazards, such as rugs, cords, and clutter.  Install safety equipment such as grab bars in bathrooms and safety rails on stairs.  Keep rooms and walkways well-lit. Activity  Follow a regular exercise program to stay fit. This will help you maintain your balance. Ask your health care provider what types of exercise are appropriate for you.  If you need a cane or walker, use it as recommended by your health care provider.  Wear supportive shoes that have nonskid soles.    Lifestyle  Do not drink alcohol if your health care provider tells you not to drink.  If you drink alcohol, limit how much you have: ? 0-1 drink a day for women. ? 0-2 drinks a day for men.  Be aware of how much alcohol is in your drink. In the [place] one drink equals one typical bottle of beer (12 oz), one-half glass of wine (5 oz), or one shot of hard liquor (1 oz).  Do not use any products that contain nicotine or tobacco, such as cigarettes and e-cigarettes. If you need help quitting, ask your health care provider. Summary  Having a healthy lifestyle and getting preventive care can help to protect your health and wellness after age 71.  Screening and testing are the best way to find a health problem early and help you avoid having a fall. Early diagnosis and treatment give you the best chance for managing medical conditions that are more common for people who are older than age 43.  Falls are a major cause of broken bones and head injuries in people who are older than age 70. Take precautions to prevent a fall at home.  Work with your health care provider to learn what changes you can make to improve your health and wellness and to prevent falls. This information is not intended to replace advice given to you by your health care provider. Make sure you discuss any questions you have with your health care provider. Document Revised: 09/12/2018 Document Reviewed: 04/04/2017 [employer] Patient Education  2021 [employer].

## 2020-08-03 NOTE — Progress Notes (Signed)
Patient ID: [name], male    DOB: 15-Feb-1956, 65 y.o.   MRN: [id number]  This visit was conducted in person.  BP (!) 150/90   Pulse 91   Temp 97.8 F (36.6 C) (Temporal)   Ht 5' 7.25" (1.708 m)   Wt 274 lb 2 oz (124.3 kg)   SpO2 94%   BMI 42.62 kg/m   BP Readings from Last 3 Encounters:  08/03/20 (!) 150/90  07/21/20 128/66  07/14/20 (!) 135/105    Pulse Readings from Last 3 Encounters:  08/03/20 91  07/21/20 72  07/14/20 99   CC: AMW  Subjective:   HPI: [name] is a 65 y.o. male presenting on 08/03/2020 for Medicare Wellness (Pt accompanied by wife, [name]- temp 98.3.  Scheduled for total hip replacement 09/06/20.)   Did not see health advisor this year.    Hearing Screening   125Hz  250Hz  500Hz  1000Hz  2000Hz  3000Hz  4000Hz  6000Hz  8000Hz   Right ear:   20 20 20  25     Left ear:   20 25 20  25       Visual Acuity Screening   Right eye Left eye Both eyes  Without correction: 20/40 20/30 20/30   With correction:       [facility] Office Visit from 08/03/2020 in [facility] [place]  PHQ-2 Total Score 3      Fall Risk  08/03/2020 11/21/2018 03/14/2018 11/15/2017 05/08/2017  Falls in the past year? 1 1 Yes Yes Yes  Comment - 8-10 falls due to loss of balance - - -  Number falls in past yr: 1 1 1 2  or more 2 or more  Comment - - - - -  Injury with Fall? 1 1 No Yes No  Comment Bruiesed abd. - - - -  Risk Factor Category  - - - - -  Risk for fall due to : - History of fall(s);Impaired balance/gait;Impaired mobility - - -  Risk for fall due to: Comment - - - - -  Follow up - - - - -   Upcoming hip replacement scheduled 09/06/2020. Needs to see GI to coordinate Avatrombopag perioperatively. They're having trouble getting in with GI. I will send message to Dr Vicente Males.   Last seen in office  Last medicare wellness visit 11/2017.  Known NASH cirrhosis.  Notes swollen lymph node to R angle of jaw present for 6 months. No globus sensation, throat  swelling, denies significant dental pain. Long time smoker, no alcohol use.   Preventative: COLONOSCOPY Date: 05/06/2007 normal, small int hemorrhoids rpt 5 yrs due to fmhx.Virtual colonoscopy 07/2014 Collene Mares) - told normal. No further colon screening recommended at this time due to comorbidities Prostate cancer screening -PSA previously reassuring. Defer due to comorbidities.  Lung cancer screening - eligible, defer at this time due to comorbidities and pending hip replacement surgery.  Flu shot yearly [place] 08/2019, 09/2019, booster 07/2020 Td2007 Pneumovax 2007, prevnar today Shingrix - discussed Advanced directives: Wants wife to be HCPOA but not formally set up.Packet previously provided.  Seat belt use discussed  Sunscreen use discussed. No changing moles on skin.  Ex smoker - 1+ ppd smoking, vaping. >30PY hx. hasn't smoked in 1 week - using 1 nicotine patch/day.  Alcohol - none Dentist - due Eye exam - due Bowel - mild constipation managed with lactulose - doesn't like taste  Bladder - ongoing urinary urge incontinence - uses diapers   Caffeine: 2 cups coffee, 2  cups soda Lives with wife, 1 dog, no children  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 x 5 years Occupation: Neurosurgeon implants, crowne and bridge, now disability 2006     Relevant past medical, surgical, family and social history reviewed and updated as indicated. Interim medical history since our last visit reviewed. Allergies and medications reviewed and updated. Outpatient Medications Prior to Visit  Medication Sig Dispense Refill  . albuterol (PROVENTIL HFA;VENTOLIN HFA) 108 (90 Base) MCG/ACT inhaler Inhale 2 puffs into the lungs every 6 (six) hours as needed for wheezing or shortness of breath. 1 Inhaler 0  . albuterol (PROVENTIL) (2.5 MG/3ML) 0.083% nebulizer solution USE 1 VIAL PER NEBULIZER EVERY 6 HRS AS NEEDED FOR WHEEZING 75 mL 6  . aspirin 81 MG tablet Take 1 tablet (81 mg total) by  mouth daily.    . B Complex-C (SUPER B COMPLEX PO) Take 1 tablet by mouth daily.    . Ca Phosphate-Cholecalciferol [phone] MG-UNIT TABS Take 1 tablet by mouth daily.     . cholecalciferol 2000 units TABS Take 2,000 Units by mouth daily.    [name] dicyclomine (BENTYL) 20 MG tablet TAKE 1 TABLET BY MOUTH 3 TIMES DAILY BEFORE MEALS. AS NEEDED FOR ABDOMINAL PAIN 30 tablet 1  . fentaNYL (DURAGESIC) 75 MCG/HR     . ferrous sulfate 325 (65 FE) MG tablet Take 1 tablet (325 mg total) by mouth every other day.    . fluticasone (FLONASE) 50 MCG/ACT nasal spray Place 2 sprays into both nostrils daily as needed for allergies.     . folic acid (FOLVITE) 1 MG tablet TAKE 1 TABLET BY MOUTH EVERY DAY 90 tablet 3  . furosemide (LASIX) 40 MG tablet TAKE 1 TABLET BY MOUTH EVERY DAY 90 tablet 1  . irbesartan (AVAPRO) 75 MG tablet TAKE 1 TABLET BY MOUTH EVERY DAY 90 tablet 3  . Lactulose 20 GM/30ML SOLN Take 30 mLs (20 g total) by mouth 3 (three) times daily. 2700 mL 5  . methocarbamol (ROBAXIN) 500 MG tablet TAKE 1 TABLET (500 MG TOTAL) BY MOUTH AT BEDTIME. 30 tablet 0  . metoprolol succinate (TOPROL-XL) 25 MG 24 hr tablet TAKE 1/2 TABLET BY MOUTH EVERY DAY 45 tablet 0  . Misc Natural Products (TART CHERRY ADVANCED) CAPS Take 1 capsule by mouth 2 (two) times daily.    . nicotine (NICODERM CQ) 14 mg/24hr patch Place 1 patch (14 mg total) onto the skin daily. 28 patch 0  . nitroGLYCERIN (NITROSTAT) 0.4 MG SL tablet Place 1 tablet (0.4 mg total) under the tongue every 5 (five) minutes as needed for chest pain. 25 tablet 12  . nystatin cream (MYCOSTATIN) APPLY TO AFFECTED AREA TWICE A DAY 75 g 0  . omeprazole (PRILOSEC) 40 MG capsule TAKE 1 CAPSULE BY MOUTH EVERY DAY 90 capsule 3  . ondansetron (ZOFRAN) 4 MG tablet TAKE 1 TABLET BY MOUTH EVERY 8 HOURS AS NEEDED FOR NAUSEA AND VOMITING 30 tablet 1  . Oxycodone HCl 10 MG TABS     . Red Yeast Rice 600 MG CAPS Take 2 capsules by mouth daily.    [name] spironolactone (ALDACTONE) 100  MG tablet TAKE 1 TABLET BY MOUTH EVERY DAY 90 tablet 1  . vitamin B-12 (CYANOCOBALAMIN) 500 MCG tablet Take 1 tablet (500 mcg total) by mouth every Monday, Wednesday, and Friday.    . Cobalamin Combinations (B-12) 1000-400 MCG SUBL Take 1 tablet by mouth daily.     . DULoxetine (CYMBALTA) 60 MG capsule TAKE 1 CAPSULE BY  MOUTH EVERY DAY 90 capsule 0   No facility-administered medications prior to visit.     Per HPI unless specifically indicated in ROS section below Review of Systems  Constitutional: Negative for activity change, appetite change, chills, fatigue, fever and unexpected weight change.  HENT: Negative for hearing loss.   Eyes: Negative for visual disturbance.  Respiratory: Positive for cough, shortness of breath and wheezing. Negative for chest tightness.   Cardiovascular: Negative for chest pain, palpitations and leg swelling.  Gastrointestinal: Positive for abdominal pain, blood in stool and constipation. Negative for abdominal distention, diarrhea, nausea (dry heaves) and vomiting.  Genitourinary: Negative for difficulty urinating and hematuria.  Musculoskeletal: Positive for arthralgias and back pain. Negative for myalgias and neck pain.  Skin: Negative for rash.  Neurological: Positive for headaches. Negative for dizziness, seizures and syncope.  Hematological: Negative for adenopathy. Does not bruise/bleed easily.  Psychiatric/Behavioral: Positive for dysphoric mood. The patient is not nervous/anxious.    Objective:  BP (!) 150/90   Pulse 91   Temp 97.8 F (36.6 C) (Temporal)   Ht 5' 7.25" (1.708 m)   Wt 274 lb 2 oz (124.3 kg)   SpO2 94%   BMI 42.62 kg/m   Wt Readings from Last 3 Encounters:  08/03/20 274 lb 2 oz (124.3 kg)  07/21/20 280 lb (127 kg)  03/18/20 277 lb 3.2 oz (125.7 kg)      Physical Exam Vitals and nursing note reviewed.  Constitutional:      General: He is not in acute distress.    Appearance: He is well-developed and well-nourished. He is  obese.     Comments: Sitting in wheelchair  HENT:     Head: Normocephalic and atraumatic.     Right Ear: Hearing, tympanic membrane, ear canal and external ear normal.     Left Ear: Hearing, tympanic membrane, ear canal and external ear normal.     Nose: Nose normal.     Mouth/Throat:     Mouth: Oropharynx is clear and moist and mucous membranes are normal.     Pharynx: Uvula midline. No oropharyngeal exudate, posterior oropharyngeal edema or posterior oropharyngeal erythema.  Eyes:     General: No scleral icterus.    Extraocular Movements: EOM normal.     Conjunctiva/sclera: Conjunctivae normal.     Pupils: Pupils are equal, round, and reactive to light.  Neck:     Thyroid: No thyroid mass or thyromegaly.  Cardiovascular:     Rate and Rhythm: Normal rate and regular rhythm.     Pulses: Normal pulses and intact distal pulses.          Radial pulses are 2+ on the right side and 2+ on the left side.     Heart sounds: Normal heart sounds. No murmur heard.   Pulmonary:     Effort: Pulmonary effort is normal. No respiratory distress.     Breath sounds: Normal breath sounds. No wheezing, rhonchi or rales.  Abdominal:     General: Abdomen is flat. Bowel sounds are normal. There is no distension.     Palpations: Abdomen is soft. There is no mass.     Tenderness: There is no abdominal tenderness. There is no guarding or rebound.     Hernia: No hernia is present.  Musculoskeletal:        General: No edema. Normal range of motion.     Cervical back: Normal range of motion and neck supple.     Right lower leg: No edema.  Left lower leg: No edema.  Lymphadenopathy:     Head:     Right side of head: Tonsillar (1cm, mildly tender) adenopathy present. No submental, submandibular, preauricular or posterior auricular adenopathy.     Left side of head: No submental, submandibular, tonsillar, preauricular or posterior auricular adenopathy.     Cervical: No cervical adenopathy.  Skin:     General: Skin is warm and dry.     Findings: No rash.  Neurological:     General: No focal deficit present.     Mental Status: He is alert and oriented to person, place, and time.     Comments:  CN grossly intact, station and gait intact Recall 2/3, 3/3 with cue Calculation 2/5 serial 3s  Psychiatric:        Mood and Affect: Mood and affect and mood normal.        Behavior: Behavior normal.        Thought Content: Thought content normal.        Judgment: Judgment normal.       Results for orders placed or performed in visit on 07/30/20  Lipase  Result Value Ref Range   Lipase 43.0 11.0 - 59.0 U/L  Protime-INR  Result Value Ref Range   INR 1.3 (H) 0.8 - 1.0 ratio   Prothrombin Time 14.1 (H) 9.6 - 13.1 sec  TSH  Result Value Ref Range   TSH 1.65 0.35 - 4.50 uIU/mL  Uric acid  Result Value Ref Range   Uric Acid, Serum 6.1 4.0 - 7.8 mg/dL  CBC with Differential/Platelet  Result Value Ref Range   WBC 4.8 4.0 - 10.5 K/uL   RBC 3.64 (L) 4.22 - 5.81 Mil/uL   Hemoglobin 13.1 13.0 - 17.0 g/dL   HCT 38.3 (L) 39.0 - 52.0 %   MCV 105.2 (H) 78.0 - 100.0 fl   MCHC 34.1 30.0 - 36.0 g/dL   RDW 15.3 11.5 - 15.5 %   Platelets 48.0 Repeated and verified X2. (LL) 150.0 - 400.0 K/uL   Neutrophils Relative % 67.4 43.0 - 77.0 %   Lymphocytes Relative 18.9 12.0 - 46.0 %   Monocytes Relative 9.7 3.0 - 12.0 %   Eosinophils Relative 3.3 0.0 - 5.0 %   Basophils Relative 0.7 0.0 - 3.0 %   Neutro Abs 3.2 1.4 - 7.7 K/uL   Lymphs Abs 0.9 0.7 - 4.0 K/uL   Monocytes Absolute 0.5 0.1 - 1.0 K/uL   Eosinophils Absolute 0.2 0.0 - 0.7 K/uL   Basophils Absolute 0.0 0.0 - 0.1 K/uL  Hemoglobin A1c  Result Value Ref Range   Hgb A1c MFr Bld 4.9 4.6 - 6.5 %  Comprehensive metabolic panel  Result Value Ref Range   Sodium 134 (L) 135 - 145 mEq/L   Potassium 4.6 3.5 - 5.1 mEq/L   Chloride 100 96 - 112 mEq/L   CO2 29 19 - 32 mEq/L   Glucose, Bld 108 (H) 70 - 99 mg/dL   BUN 16 6 - 23 mg/dL   Creatinine,  Ser 0.88 0.40 - 1.50 mg/dL   Total Bilirubin 1.4 (H) 0.2 - 1.2 mg/dL   Alkaline Phosphatase 168 (H) 39 - 117 U/L   AST 26 0 - 37 U/L   ALT 14 0 - 53 U/L   Total Protein 7.2 6.0 - 8.3 g/dL   Albumin 2.9 (L) 3.5 - 5.2 g/dL   GFR 90.30 >60.00 mL/min   Calcium 8.4 8.4 - 10.5 mg/dL  Lipid panel  Result Value Ref Range   Cholesterol 143 0 - 200 mg/dL   Triglycerides 87.0 0.0 - 149.0 mg/dL   HDL 38.00 (L) >39.00 mg/dL   VLDL 17.4 0.0 - 40.0 mg/dL   LDL Cholesterol 88 0 - 99 mg/dL   Total CHOL/HDL Ratio 4    NonHDL 105.13   VITAMIN D 25 Hydroxy (Vit-D Deficiency, Fractures)  Result Value Ref Range   VITD 25.65 (L) 30.00 - 100.00 ng/mL  Vitamin B12  Result Value Ref Range   Vitamin B-12 926 (H) 211 - 911 pg/mL   Lab Results  Component Value Date   PSA 0.12 11/15/2017   PSA 0.09 (L) 08/31/2016   PSA 0.19 01/12/2014   Depression screen PHQ 2/9 08/03/2020 11/21/2018 03/14/2018 11/15/2017 05/08/2017  Decreased Interest 0 3 0 3 0  Down, Depressed, Hopeless 3 1 0 3 0  PHQ - 2 Score 3 4 0 6 0  Altered sleeping 3 3 - 3 -  Tired, decreased energy 3 3 - 3 -  Change in appetite 2 3 - 3 -  Feeling bad or failure about yourself  2 0 - 3 -  Trouble concentrating 3 0 - 3 -  Moving slowly or fidgety/restless 3 0 - 2 -  Suicidal thoughts 0 0 - 0 -  PHQ-9 Score 19 13 - 23 -  Difficult doing work/chores - Not difficult at all - - -  Some recent data might be hidden    GAD 7 : Generalized Anxiety Score 08/03/2020 11/15/2017  Nervous, Anxious, on Edge 2 0  Control/stop worrying 2 3  Worry too much - different things 2 3  Trouble relaxing 2 3  Restless 0 1  Easily annoyed or irritable 0 3  Afraid - awful might happen 0 0  Total GAD 7 Score 8 13   Assessment & Plan:  This visit occurred during the SARS-CoV-2 public health emergency.  Safety protocols were in place, including screening questions prior to the visit, additional usage of staff PPE, and extensive cleaning of exam room while observing  appropriate contact time as indicated for disinfecting solutions.   Problem List Items Addressed This Visit    Vitamin D deficiency    Update labs on Ca/vit D replacement       Vitamin B12 deficiency    Update levels on replacement. He is on MWF b12 as well as b complex.       Thrombocytopenia (HCC)    Known cirrhotic thrombocytopenia  Planned avatrombopag through GI to boost platelets preoperatively      Statin intolerance    Statins caused shortness of breath       Smoker    1 PPD smoker, >30PY hx - last week started nicoderm CQ and is doing well without having smoked a cigarette in the past week. Congratulated.  Eligible for lung cancer screening - will defer at this time pending hip replacement surgery.       Relevant Orders   CT Soft Tissue Neck W Contrast   Protein-calorie malnutrition ([place])    Discussed importance of regular protein intake.      Prediabetes    Update a1c in h/o prediabetes.       Osteoarthritis of right hip    Pending hip replacement surgery scheduled 09/06/2020. This has been delayed multiple times.  Plan was previously to use Avatrombopag preoperatively in h/o thrombocytopenia due to cirrhosis.  I will send note to GI regarding this.       Obesity,  morbid, BMI 40.0-49.9 ([place])    Weight gain noted since last year. Encouraged renewed efforts towards weight loss.       Medicare annual wellness visit, subsequent - Primary    I have personally reviewed the Medicare Annual Wellness questionnaire and have noted 1. The patient's medical and social history 2. Their use of alcohol, tobacco or illicit drugs 3. Their current medications and supplements 4. The patient's functional ability including ADL's, fall risks, home safety risks and hearing or visual impairment. Cognitive function has been assessed and addressed as indicated.  5. Diet and physical activity 6. Evidence for depression or mood disorders The patients weight, height, BMI have been  recorded in the chart. I have made referrals, counseling and provided education to the patient based on review of the above and I have provided the pt with a written personalized care plan for preventive services. Provider list updated.. See scanned questionairre as needed for further documentation. Reviewed preventative protocols and updated unless pt declined.       MDD (major depressive disorder), recurrent severe, without psychosis ([place])    Chronic, deteriorated. Continue cymbalta 71m daily. Consider adjuvant med vs psych eval.  Significant situational component given disability related to hip.       Relevant Medications   DULoxetine (CYMBALTA) 60 MG capsule   Lymphadenopathy of head and neck region    Newly noted, present over the past 6 months.  In smoker, will need imaging - order neck CT with contrast. Check Cr.      Relevant Orders   CT Soft Tissue Neck W Contrast   HLD (hyperlipidemia)    On RYR 12010mdaily. Intolerant of statins (dyspnea). zetia caused leg pains. Update FLP.  The ASCVD Risk score (GMikey BussingC Jr., et al., 2013) failed to calculate for the following reasons:   The patient has a prior MI or stroke diagnosis       Gout    No recent flares - update urate       Essential hypertension (Chronic)    Elevated today. Continue current regimen of irbesartan, lasix 4020maily, spironolactone 100m7mily. Will increase toprol XL to 25mg52mly (currenty at 12.5mg d54my).       Encounter for general adult medical examination with abnormal findings    Preventative protocols reviewed and updated unless pt declined. Discussed healthy diet and lifestyle.       COPD (chronic obstructive pulmonary disease) (HCC)    Overall stable period on PRN albuterol.       Cirrhosis of liver with ascites (HCC)    NASH cirrhosis.  See above regarding plan for platelets perioperatively.       Chronic pain syndrome    Followed by pain clinic in [place]  Relevant  Medications   DULoxetine (CYMBALTA) 60 MG capsule   Charcot-Marie-Tooth disease   Relevant Medications   DULoxetine (CYMBALTA) 60 MG capsule   CAD (coronary artery disease)    Continue aspirin, RYR (statin intolerance). Did not tolerate zetia.       Aortic atherosclerosis (HCC)    On aspirin, RYR, in statin intolerance.       Anemia    Update CBC      Advanced care planning/counseling discussion    Would want wife to be HCPOA. Packet previously provided. Has not returned.       Abdominal aortic aneurysm (AAA) without rupture (HCC) [facility]/p endovascular repair 2017 sees [facility] yearly in the summer months.  Other Visit Diagnoses    Need for vaccination with 13-polyvalent pneumococcal conjugate vaccine       Relevant Orders   Pneumococcal conjugate vaccine 13-valent IM (Completed)       Meds ordered this encounter  Medications  . DULoxetine (CYMBALTA) 60 MG capsule    Sig: Take 1 capsule (60 mg total) by mouth daily.    Dispense:  90 capsule    Refill:  3   Orders Placed This Encounter  Procedures  . CT Soft Tissue Neck W Contrast    Standing Status:   Future    Standing Expiration Date:   08/05/2021    Order Specific Question:   If indicated for the ordered procedure, I authorize the administration of contrast media per Radiology protocol    Answer:   Yes    Order Specific Question:   Preferred imaging location?    Answer:   [facility]  . Pneumococcal conjugate vaccine 13-valent IM    Patient instructions; Prevnar today  If interested, check with pharmacy about new 2 shot shingles series (shingrix).  Good to see you today. I will touch base with Dr Vicente Males about upcoming hip surgery.  We will check neck CT with contrast for swollen gland.  Return in 4 months for follow up visit for further memory test.   Follow up plan: Return in about 4 months (around 12/03/2020), or if symptoms worsen or fail to improve, for follow up visit.  Ria Bush, MD

## 2020-08-05 ENCOUNTER — Encounter: Payer: Self-pay | Admitting: Family Medicine

## 2020-08-05 ENCOUNTER — Telehealth: Payer: Self-pay | Admitting: Family Medicine

## 2020-08-05 DIAGNOSIS — K118 Other diseases of salivary glands: Secondary | ICD-10-CM | POA: Insufficient documentation

## 2020-08-05 DIAGNOSIS — Z789 Other specified health status: Secondary | ICD-10-CM | POA: Insufficient documentation

## 2020-08-05 DIAGNOSIS — R590 Localized enlarged lymph nodes: Secondary | ICD-10-CM | POA: Insufficient documentation

## 2020-08-05 MED ORDER — METOPROLOL SUCCINATE ER 25 MG PO TB24: 25.0000 mg | ORAL_TABLET | Freq: Every day | ORAL | 3 refills | Status: DC

## 2020-08-05 NOTE — Assessment & Plan Note (Signed)
Overall stable period on PRN albuterol.

## 2020-08-05 NOTE — Assessment & Plan Note (Signed)

## 2020-08-05 NOTE — Assessment & Plan Note (Addendum)
1 PPD smoker, >30PY hx - last week started nicoderm CQ and is doing well without having smoked a cigarette in the past week. Congratulated.  Eligible for lung cancer screening - will defer at this time pending hip replacement surgery.

## 2020-08-05 NOTE — Assessment & Plan Note (Signed)
Elevated today. Continue current regimen of irbesartan, lasix 48m daily, spironolactone 1090mdaily. Will increase toprol XL to 2574maily (currenty at 12.5mg60mily).

## 2020-08-05 NOTE — Assessment & Plan Note (Signed)
Update CBC. 

## 2020-08-05 NOTE — Assessment & Plan Note (Signed)
Continue aspirin, RYR (statin intolerance). Did not tolerate zetia.

## 2020-08-05 NOTE — Assessment & Plan Note (Signed)
Update labs on Ca/vit D replacement

## 2020-08-05 NOTE — Assessment & Plan Note (Signed)
On aspirin, RYR, in statin intolerance.

## 2020-08-05 NOTE — Assessment & Plan Note (Signed)
Known cirrhotic thrombocytopenia  Planned avatrombopag through GI to boost platelets preoperatively

## 2020-08-05 NOTE — Assessment & Plan Note (Signed)
S/p endovascular repair 2017 sees [facility] yearly in the summer months.

## 2020-08-05 NOTE — Assessment & Plan Note (Signed)
Update a1c in h/o prediabetes.

## 2020-08-05 NOTE — Assessment & Plan Note (Signed)
Pending hip replacement surgery scheduled 09/06/2020. This has been delayed multiple times.  Plan was previously to use Avatrombopag preoperatively in h/o thrombocytopenia due to cirrhosis.  I will send note to GI regarding this.

## 2020-08-05 NOTE — Assessment & Plan Note (Signed)
Discussed importance of regular protein intake.

## 2020-08-05 NOTE — Assessment & Plan Note (Signed)
Preventative protocols reviewed and updated unless pt declined. Discussed healthy diet and lifestyle.  

## 2020-08-05 NOTE — Telephone Encounter (Signed)
Plz notify - given elevated blood pressures in office I'd like him to increase his toprol XL to 58m daily. New sig sent to pharmacy.  Monitor BP at home and let uKore[place]now if consistently staying >140/90.

## 2020-08-05 NOTE — Assessment & Plan Note (Signed)
Followed by pain clinic in [place].

## 2020-08-05 NOTE — Assessment & Plan Note (Signed)
Weight gain noted since last year. Encouraged renewed efforts towards weight loss.

## 2020-08-05 NOTE — Assessment & Plan Note (Signed)
No recent flares - update urate

## 2020-08-05 NOTE — Assessment & Plan Note (Signed)
Would want wife to be HCPOA. Packet previously provided. Has not returned.

## 2020-08-05 NOTE — Assessment & Plan Note (Signed)
Chronic, deteriorated. Continue cymbalta 32m daily. Consider adjuvant med vs psych eval.  Significant situational component given disability related to hip.

## 2020-08-05 NOTE — Assessment & Plan Note (Signed)
Update levels on replacement. He is on MWF b12 as well as b complex.

## 2020-08-05 NOTE — Assessment & Plan Note (Signed)
Newly noted, present over the past 6 months.  In smoker, will need imaging - order neck CT with contrast. Check Cr.

## 2020-08-05 NOTE — Telephone Encounter (Signed)
Left message on vm per dpr relaying Dr. Synthia Innocent message.

## 2020-08-05 NOTE — Assessment & Plan Note (Addendum)
On RYR 1261m daily. Intolerant of statins (dyspnea). zetia caused leg pains. Update FLP.  The ASCVD Risk score (Miguel BussingDC Jr., et al., 2013) failed to calculate for the following reasons:   The patient has a prior MI or stroke diagnosis

## 2020-08-05 NOTE — Assessment & Plan Note (Signed)
Statins caused shortness of breath

## 2020-08-05 NOTE — Assessment & Plan Note (Signed)
NASH cirrhosis.  See above regarding plan for platelets perioperatively.

## 2020-08-06 DIAGNOSIS — M1611 Unilateral primary osteoarthritis, right hip: Secondary | ICD-10-CM | POA: Diagnosis not present

## 2020-08-06 DIAGNOSIS — E119 Type 2 diabetes mellitus without complications: Secondary | ICD-10-CM | POA: Diagnosis not present

## 2020-08-06 DIAGNOSIS — I10 Essential (primary) hypertension: Secondary | ICD-10-CM | POA: Diagnosis not present

## 2020-08-06 DIAGNOSIS — L89312 Pressure ulcer of right buttock, stage 2: Secondary | ICD-10-CM | POA: Diagnosis not present

## 2020-08-06 DIAGNOSIS — J449 Chronic obstructive pulmonary disease, unspecified: Secondary | ICD-10-CM | POA: Diagnosis not present

## 2020-08-06 DIAGNOSIS — I429 Cardiomyopathy, unspecified: Secondary | ICD-10-CM | POA: Diagnosis not present

## 2020-08-06 DIAGNOSIS — M109 Gout, unspecified: Secondary | ICD-10-CM | POA: Diagnosis not present

## 2020-08-06 DIAGNOSIS — M47812 Spondylosis without myelopathy or radiculopathy, cervical region: Secondary | ICD-10-CM | POA: Diagnosis not present

## 2020-08-06 DIAGNOSIS — I2510 Atherosclerotic heart disease of native coronary artery without angina pectoris: Secondary | ICD-10-CM | POA: Diagnosis not present

## 2020-08-06 NOTE — Telephone Encounter (Signed)
Returning phone call from Mrs [name]

## 2020-08-09 NOTE — Telephone Encounter (Signed)
Left message on vm per dpr relaying Dr. Synthia Innocent message.

## 2020-08-10 DIAGNOSIS — E119 Type 2 diabetes mellitus without complications: Secondary | ICD-10-CM | POA: Diagnosis not present

## 2020-08-10 DIAGNOSIS — M109 Gout, unspecified: Secondary | ICD-10-CM | POA: Diagnosis not present

## 2020-08-10 DIAGNOSIS — J449 Chronic obstructive pulmonary disease, unspecified: Secondary | ICD-10-CM | POA: Diagnosis not present

## 2020-08-10 DIAGNOSIS — I10 Essential (primary) hypertension: Secondary | ICD-10-CM | POA: Diagnosis not present

## 2020-08-10 DIAGNOSIS — I429 Cardiomyopathy, unspecified: Secondary | ICD-10-CM | POA: Diagnosis not present

## 2020-08-10 DIAGNOSIS — M1611 Unilateral primary osteoarthritis, right hip: Secondary | ICD-10-CM | POA: Diagnosis not present

## 2020-08-10 DIAGNOSIS — I2510 Atherosclerotic heart disease of native coronary artery without angina pectoris: Secondary | ICD-10-CM | POA: Diagnosis not present

## 2020-08-10 DIAGNOSIS — M47812 Spondylosis without myelopathy or radiculopathy, cervical region: Secondary | ICD-10-CM | POA: Diagnosis not present

## 2020-08-10 DIAGNOSIS — L89312 Pressure ulcer of right buttock, stage 2: Secondary | ICD-10-CM | POA: Diagnosis not present

## 2020-08-10 NOTE — Telephone Encounter (Signed)
Spoke with pt relaying Dr. G's message. Pt verbalizes understanding.  

## 2020-08-11 DIAGNOSIS — L89312 Pressure ulcer of right buttock, stage 2: Secondary | ICD-10-CM | POA: Diagnosis not present

## 2020-08-11 DIAGNOSIS — E119 Type 2 diabetes mellitus without complications: Secondary | ICD-10-CM | POA: Diagnosis not present

## 2020-08-11 DIAGNOSIS — M109 Gout, unspecified: Secondary | ICD-10-CM | POA: Diagnosis not present

## 2020-08-11 DIAGNOSIS — J449 Chronic obstructive pulmonary disease, unspecified: Secondary | ICD-10-CM | POA: Diagnosis not present

## 2020-08-11 DIAGNOSIS — I10 Essential (primary) hypertension: Secondary | ICD-10-CM | POA: Diagnosis not present

## 2020-08-11 DIAGNOSIS — M47812 Spondylosis without myelopathy or radiculopathy, cervical region: Secondary | ICD-10-CM | POA: Diagnosis not present

## 2020-08-11 DIAGNOSIS — M1611 Unilateral primary osteoarthritis, right hip: Secondary | ICD-10-CM | POA: Diagnosis not present

## 2020-08-11 DIAGNOSIS — I429 Cardiomyopathy, unspecified: Secondary | ICD-10-CM | POA: Diagnosis not present

## 2020-08-11 DIAGNOSIS — I2510 Atherosclerotic heart disease of native coronary artery without angina pectoris: Secondary | ICD-10-CM | POA: Diagnosis not present

## 2020-08-12 DIAGNOSIS — I2510 Atherosclerotic heart disease of native coronary artery without angina pectoris: Secondary | ICD-10-CM | POA: Diagnosis not present

## 2020-08-12 DIAGNOSIS — I493 Ventricular premature depolarization: Secondary | ICD-10-CM | POA: Diagnosis not present

## 2020-08-12 DIAGNOSIS — J449 Chronic obstructive pulmonary disease, unspecified: Secondary | ICD-10-CM | POA: Diagnosis not present

## 2020-08-12 DIAGNOSIS — Z01812 Encounter for preprocedural laboratory examination: Secondary | ICD-10-CM | POA: Diagnosis not present

## 2020-08-12 DIAGNOSIS — Z01810 Encounter for preprocedural cardiovascular examination: Secondary | ICD-10-CM | POA: Diagnosis not present

## 2020-08-12 DIAGNOSIS — K760 Fatty (change of) liver, not elsewhere classified: Secondary | ICD-10-CM | POA: Diagnosis not present

## 2020-08-12 DIAGNOSIS — Z862 Personal history of diseases of the blood and blood-forming organs and certain disorders involving the immune mechanism: Secondary | ICD-10-CM | POA: Diagnosis not present

## 2020-08-12 DIAGNOSIS — M1611 Unilateral primary osteoarthritis, right hip: Secondary | ICD-10-CM | POA: Diagnosis not present

## 2020-08-12 DIAGNOSIS — I459 Conduction disorder, unspecified: Secondary | ICD-10-CM | POA: Diagnosis not present

## 2020-08-16 ENCOUNTER — Ambulatory Visit: Payer: Medicare HMO | Admitting: Gastroenterology

## 2020-08-16 ENCOUNTER — Other Ambulatory Visit: Payer: Self-pay

## 2020-08-16 ENCOUNTER — Telehealth: Payer: Self-pay | Admitting: Gastroenterology

## 2020-08-16 VITALS — BP 122/79 | HR 72 | Ht 69.0 in | Wt 273.0 lb

## 2020-08-16 DIAGNOSIS — R188 Other ascites: Secondary | ICD-10-CM | POA: Diagnosis not present

## 2020-08-16 DIAGNOSIS — D696 Thrombocytopenia, unspecified: Secondary | ICD-10-CM | POA: Diagnosis not present

## 2020-08-16 DIAGNOSIS — K7682 Hepatic encephalopathy: Secondary | ICD-10-CM

## 2020-08-16 DIAGNOSIS — K7290 Hepatic failure, unspecified without coma: Secondary | ICD-10-CM | POA: Diagnosis not present

## 2020-08-16 DIAGNOSIS — K7460 Unspecified cirrhosis of liver: Secondary | ICD-10-CM

## 2020-08-16 MED ORDER — AVATROMBOPAG MALEATE 20 MG PO TABS: 40.0000 mg | ORAL_TABLET | Freq: Every day | ORAL | 0 refills | Status: AC

## 2020-08-16 MED ORDER — RIFAXIMIN 550 MG PO TABS: 550.0000 mg | ORAL_TABLET | Freq: Two times a day (BID) | ORAL | 0 refills | Status: AC

## 2020-08-16 NOTE — Telephone Encounter (Signed)
Patient's wife called asking for call back, needs prior authorization on medication that Dr Vicente Males prescribed today, please call wife's cell phone at  [phone]

## 2020-08-16 NOTE — Progress Notes (Unsigned)
[name] MD, MRCP(U.K) [address]  [place]  [place], [place] [address]  Main: [phone]  Fax: [phone]   Primary Care Physician: Ria Bush, MD  Primary Gastroenterologist:  Dr. [name]   Preop evaluation for thrombocytopenia  HPI: [name] is a 65 y.o. male   Summary of history :  He was initially referred and seen on 02/13/18 for cirrhosis of the liver.He has been seen by [facility] in the past.. His Cirrhosis had been attriuted to NASH. Used cocaine in his 20's, none recently. Weighed 370 lbs at his heaviest. Was always " big", he is not diabetic.   EGD 01/2017 -PHG, mild stricture was dilated.  RUQ USG 11/2017 : Small ascites, cirrhosis, Chronic dilation of CBD, mild dilation of PD, splenomegaly   CT angio of the abdomen 12/2017:Post endovascular repair of suprarenal abdominal aortic aneurysm without evidence of complication.Suspected hemodynamically significant stenosis involving the origin of the common hepatic artery, similar to the 12/2015 examination.Potential hemodynamically significant narrowing involving theproximal aspect of the right SFA.Findings of hepatic cirrhosis and portal venous hypertension with several varices about the gastroesophageal junction, splenomegaly and trace amount of intra-abdominal ascites. No discrete hyperenhancing hepatic lesions  02/20/18 ; EGD -normal -no eosinophils in esophagus.  02/13/18 : Cr 1.03,Hep A ab positive , albumin 3.0 ,alk phos 149 , Hbsab/ag,HCV ab , ANA,negative .Ceruloplasmin ,alpha 1 AT,Iron studies - normal  03/19/18 : liver biopsy : steatosis , focal plasma cells, mild chronic cholestasis. Unmable to get a diagnosis due to burned out liver disease. 04/25/2019 : INR : 1.3 , MELD 10,Cr 0.94 05/15/2019:MRI liver cirrhotic liver, dilated CBD, no obstruction . Splenomegaly and trace ascites 05/12/2019: EGD: Normal  Smooth muscle antibody positive , elevated immunoglobulins G,  AMA,LKM ab negative Hospital admission in 03/26/2019 for ascites, underwent paracentesis.    Interval history7/11/2019- 08/16/2020  MELD-Na score: 11 at 07/30/2020 12:18 PM MELD score: 11 at 07/30/2020 12:18 PM Calculated from: Serum Creatinine: 0.88 mg/dL (Using min of 1 mg/dL) at 07/30/2020 12:18 PM Serum Sodium: 134 mEq/L at 07/30/2020 12:18 PM Total Bilirubin: 1.4 mg/dL at 07/30/2020 12:18 PM INR(ratio): 1.3 ratio at 07/30/2020 12:18 PM Age: 70 years   07/30/2020: B12 normal, total bilirubin 1.4, creatinine 0.88, sodium 134, hemoglobin and 13.1 platelet count 48.  INR 1.3.  07/14/2020 CT scan of the chest abdomen pelvis with contrast shows findings consistent with cirrhosis and portal hypertension stable common bile duct dilation since 2020  He complains of occasional confusion.  On lactulose.  Takes it on and off.  Taking his Lasix and Aldactone as well.  Current Outpatient Medications  Medication Sig Dispense Refill  . albuterol (PROVENTIL HFA;VENTOLIN HFA) 108 (90 Base) MCG/ACT inhaler Inhale 2 puffs into the lungs every 6 (six) hours as needed for wheezing or shortness of breath. 1 Inhaler 0  . albuterol (PROVENTIL) (2.5 MG/3ML) 0.083% nebulizer solution USE 1 VIAL PER NEBULIZER EVERY 6 HRS AS NEEDED FOR WHEEZING 75 mL 6  . aspirin 81 MG tablet Take 1 tablet (81 mg total) by mouth daily.    . Avatrombopag Maleate 20 MG TABS Take 40 mg by mouth daily for 5 days. 10 tablet 0  . B Complex-C (SUPER B COMPLEX PO) Take 1 tablet by mouth daily.    . Ca Phosphate-Cholecalciferol [phone] MG-UNIT TABS Take 1 tablet by mouth daily.     . cholecalciferol 2000 units TABS Take 2,000 Units by mouth daily.    [name] dicyclomine (BENTYL) 20 MG tablet TAKE  1 TABLET BY MOUTH 3 TIMES DAILY BEFORE MEALS. AS NEEDED FOR ABDOMINAL PAIN 30 tablet 1  . DULoxetine (CYMBALTA) 60 MG capsule Take 1 capsule (60 mg total) by mouth daily. 90 capsule 3  . fentaNYL (DURAGESIC) 75 MCG/HR     . ferrous sulfate 325 (65  FE) MG tablet Take 1 tablet (325 mg total) by mouth every other day.    . fluticasone (FLONASE) 50 MCG/ACT nasal spray Place 2 sprays into both nostrils daily as needed for allergies.     . folic acid (FOLVITE) 1 MG tablet TAKE 1 TABLET BY MOUTH EVERY DAY 90 tablet 3  . furosemide (LASIX) 40 MG tablet TAKE 1 TABLET BY MOUTH EVERY DAY 90 tablet 1  . irbesartan (AVAPRO) 75 MG tablet TAKE 1 TABLET BY MOUTH EVERY DAY 90 tablet 3  . Lactulose 20 GM/30ML SOLN Take 30 mLs (20 g total) by mouth 3 (three) times daily. 2700 mL 5  . methocarbamol (ROBAXIN) 500 MG tablet TAKE 1 TABLET (500 MG TOTAL) BY MOUTH AT BEDTIME. 30 tablet 0  . metoprolol succinate (TOPROL-XL) 25 MG 24 hr tablet Take 1 tablet (25 mg total) by mouth daily. 90 tablet 3  . Misc Natural Products (TART CHERRY ADVANCED) CAPS Take 1 capsule by mouth 2 (two) times daily.    . nicotine (NICODERM CQ) 14 mg/24hr patch Place 1 patch (14 mg total) onto the skin daily. 28 patch 0  . nitroGLYCERIN (NITROSTAT) 0.4 MG SL tablet Place 1 tablet (0.4 mg total) under the tongue every 5 (five) minutes as needed for chest pain. 25 tablet 12  . nystatin cream (MYCOSTATIN) APPLY TO AFFECTED AREA TWICE A DAY 75 g 0  . omeprazole (PRILOSEC) 40 MG capsule TAKE 1 CAPSULE BY MOUTH EVERY DAY 90 capsule 3  . ondansetron (ZOFRAN) 4 MG tablet TAKE 1 TABLET BY MOUTH EVERY 8 HOURS AS NEEDED FOR NAUSEA AND VOMITING 30 tablet 1  . Oxycodone HCl 10 MG TABS     . Red Yeast Rice 600 MG CAPS Take 2 capsules by mouth daily.    [name] spironolactone (ALDACTONE) 100 MG tablet TAKE 1 TABLET BY MOUTH EVERY DAY 90 tablet 1  . vitamin B-12 (CYANOCOBALAMIN) 500 MCG tablet Take 1 tablet (500 mcg total) by mouth every Monday, Wednesday, and Friday.     No current facility-administered medications for this visit.    Allergies as of 08/16/2020 - Review Complete 08/16/2020  Allergen Reaction Noted  . Statins Shortness Of Breath 01/22/2012  . Losartan Other (See Comments) 01/30/2013  .  Wellbutrin [bupropion] Other (See Comments) 08/03/2020  . Allopurinol Nausea Only 12/18/2012  . Baclofen Nausea And Vomiting 06/23/2015  . Gabapentin Nausea And Vomiting 04/21/2015  . Penicillins Nausea And Vomiting 02/20/2018  . Tramadol Nausea Only 03/13/2017    ROS:  General: Negative for anorexia, weight loss, fever, chills, fatigue, weakness. ENT: Negative for hoarseness, difficulty swallowing , nasal congestion. CV: Negative for chest pain, angina, palpitations, dyspnea on exertion, peripheral edema.  Respiratory: Negative for dyspnea at rest, dyspnea on exertion, cough, sputum, wheezing.  GI: See history of present illness. GU:  Negative for dysuria, hematuria, urinary incontinence, urinary frequency, nocturnal urination.  Endo: Negative for unusual weight change.    Physical Examination:   BP 122/79   Pulse 72   Ht 5' 9"  (1.753 m)   Wt 273 lb (123.8 kg)   BMI 40.32 kg/m   General: Well-nourished, well-developed in no acute distress.  Eyes: No icterus. Conjunctivae pink. Lungs:  Clear to auscultation bilaterally. Non-labored. Heart: Regular rate and rhythm, no murmurs rubs or gallops.  Abdomen: Bowel sounds are normal, nontender, nondistended, no hepatosplenomegaly or masses, no abdominal bruits or hernia , no rebound or guarding.   Neuro: Alert and oriented x 3.  Grossly intact. Skin: Warm and dry, no jaundice.   Psych: Alert and cooperative, normal mood and affect.   Imaging Studies: No results found.  Assessment and Plan:   [name] is a 65 y.o. y/o male here to follow upfor liver cirrhosis.  He is being scheduled for orthopedic surgery and requested commencing on medication to boost his platelet count.  In the past been felt by his last GI in 2018 was probably NASH related, autoimmune work upshows a positive smooth muscle ab and elevated immunoglobulins.MELD9Liver biopsy is inconclusive due to burnt out liver. No evidence of hepatic  encephalopathy.   Plan  1.Continue Aldactone 100, Lasix 40, lactulose titrated to 1-2 soft bowel movements per day.. Commence on Xifaxan for hepatic encephelopathy 2. Right upper quadrant ultrasound to screen for [facility] in 01/22/2021 3.EGDto screen for varices in 2022 December 4. Low-salt diet 5.  I have sent in the medication to boost his platelet count prior to orthopedic surgery. Avatrombopag: .Platelet count 40,000 to <50,000/mm3: 40 mg once daily for 5 consecutive day.In a phase III trial, 227 individuals with liver disease who had platelet counts <50,000/microL and required an invasive procedure were randomly assigned to receive avatrombopag or placebo daily for five days and assessed for the need for platelet transfusions up to seven days after procedure . Avatrombopag increased the platelet count and reduced the need for transfusions (transfusions given in 12 to 34 percent of the eltrombopag group versus 62 to 77 percent of the placebo group). There was no difference in bleeding events, and no thrombotic events after close assessment of the portal vein system by ultrasound .  He has been instructed to obtain a CBC just before he starts the medication to get a baseline platelet count . Patients should undergo procedure 5 to 8 days after the last avatrombopag dose. Obtain a platelet count prior to therapy administration and on the day of the procedure (to ensure adequate increase in platelet count).    Dr [name]  MD,MRCP [facility]) Follow up in 8 weeks

## 2020-08-16 NOTE — Patient Instructions (Signed)
He has been instructed to obtain a CBC just before he starts the medication to get a baseline platelet count . Patients should undergo procedure 5 to 8 days after the last avatrombopag dose. Obtain a platelet count prior to therapy administration and on the day of the procedure (to ensure adequate increase in platelet count).

## 2020-08-17 DIAGNOSIS — M25511 Pain in right shoulder: Secondary | ICD-10-CM | POA: Diagnosis not present

## 2020-08-17 DIAGNOSIS — I10 Essential (primary) hypertension: Secondary | ICD-10-CM | POA: Diagnosis not present

## 2020-08-17 DIAGNOSIS — G600 Hereditary motor and sensory neuropathy: Secondary | ICD-10-CM | POA: Diagnosis not present

## 2020-08-17 DIAGNOSIS — I429 Cardiomyopathy, unspecified: Secondary | ICD-10-CM | POA: Diagnosis not present

## 2020-08-17 DIAGNOSIS — J449 Chronic obstructive pulmonary disease, unspecified: Secondary | ICD-10-CM | POA: Diagnosis not present

## 2020-08-17 DIAGNOSIS — Z79891 Long term (current) use of opiate analgesic: Secondary | ICD-10-CM | POA: Diagnosis not present

## 2020-08-17 DIAGNOSIS — M109 Gout, unspecified: Secondary | ICD-10-CM | POA: Diagnosis not present

## 2020-08-17 DIAGNOSIS — I2510 Atherosclerotic heart disease of native coronary artery without angina pectoris: Secondary | ICD-10-CM | POA: Diagnosis not present

## 2020-08-17 DIAGNOSIS — M1611 Unilateral primary osteoarthritis, right hip: Secondary | ICD-10-CM | POA: Diagnosis not present

## 2020-08-17 DIAGNOSIS — M542 Cervicalgia: Secondary | ICD-10-CM | POA: Diagnosis not present

## 2020-08-17 DIAGNOSIS — L89312 Pressure ulcer of right buttock, stage 2: Secondary | ICD-10-CM | POA: Diagnosis not present

## 2020-08-17 DIAGNOSIS — Z79899 Other long term (current) drug therapy: Secondary | ICD-10-CM | POA: Diagnosis not present

## 2020-08-17 DIAGNOSIS — M47812 Spondylosis without myelopathy or radiculopathy, cervical region: Secondary | ICD-10-CM | POA: Diagnosis not present

## 2020-08-17 DIAGNOSIS — E119 Type 2 diabetes mellitus without complications: Secondary | ICD-10-CM | POA: Diagnosis not present

## 2020-08-17 DIAGNOSIS — M25551 Pain in right hip: Secondary | ICD-10-CM | POA: Diagnosis not present

## 2020-08-17 DIAGNOSIS — G894 Chronic pain syndrome: Secondary | ICD-10-CM | POA: Diagnosis not present

## 2020-08-17 NOTE — Telephone Encounter (Signed)
[name] can we look into it please

## 2020-08-17 NOTE — Telephone Encounter (Signed)
Patient's wife called again regarding the prior authorization.  I assured her that we are looking into this and will call her back as soon as possible.

## 2020-08-17 NOTE — Telephone Encounter (Signed)
Spoke with pt's wife and explained that pt's insurance does not completely cover the Xifaxan hence we would need to utilize a patient assistance program for help with the cost. She plans to pick up the application to complete and return this week.

## 2020-08-17 NOTE — Telephone Encounter (Signed)
Returned pt's call regarding the prescription. Unable to contact, lvm to return call

## 2020-08-18 DIAGNOSIS — I10 Essential (primary) hypertension: Secondary | ICD-10-CM | POA: Diagnosis not present

## 2020-08-18 DIAGNOSIS — M1611 Unilateral primary osteoarthritis, right hip: Secondary | ICD-10-CM | POA: Diagnosis not present

## 2020-08-18 DIAGNOSIS — I429 Cardiomyopathy, unspecified: Secondary | ICD-10-CM | POA: Diagnosis not present

## 2020-08-18 DIAGNOSIS — M47812 Spondylosis without myelopathy or radiculopathy, cervical region: Secondary | ICD-10-CM | POA: Diagnosis not present

## 2020-08-18 DIAGNOSIS — L89312 Pressure ulcer of right buttock, stage 2: Secondary | ICD-10-CM | POA: Diagnosis not present

## 2020-08-18 DIAGNOSIS — I2510 Atherosclerotic heart disease of native coronary artery without angina pectoris: Secondary | ICD-10-CM | POA: Diagnosis not present

## 2020-08-18 DIAGNOSIS — M109 Gout, unspecified: Secondary | ICD-10-CM | POA: Diagnosis not present

## 2020-08-18 DIAGNOSIS — E119 Type 2 diabetes mellitus without complications: Secondary | ICD-10-CM | POA: Diagnosis not present

## 2020-08-18 DIAGNOSIS — J449 Chronic obstructive pulmonary disease, unspecified: Secondary | ICD-10-CM | POA: Diagnosis not present

## 2020-08-20 DIAGNOSIS — F3342 Major depressive disorder, recurrent, in full remission: Secondary | ICD-10-CM | POA: Diagnosis not present

## 2020-08-20 DIAGNOSIS — I2510 Atherosclerotic heart disease of native coronary artery without angina pectoris: Secondary | ICD-10-CM | POA: Diagnosis not present

## 2020-08-20 DIAGNOSIS — Z87891 Personal history of nicotine dependence: Secondary | ICD-10-CM | POA: Diagnosis not present

## 2020-08-20 DIAGNOSIS — I714 Abdominal aortic aneurysm, without rupture: Secondary | ICD-10-CM | POA: Diagnosis not present

## 2020-08-20 DIAGNOSIS — G8929 Other chronic pain: Secondary | ICD-10-CM | POA: Diagnosis not present

## 2020-08-20 DIAGNOSIS — D692 Other nonthrombocytopenic purpura: Secondary | ICD-10-CM | POA: Diagnosis not present

## 2020-08-20 DIAGNOSIS — J449 Chronic obstructive pulmonary disease, unspecified: Secondary | ICD-10-CM | POA: Diagnosis not present

## 2020-08-20 DIAGNOSIS — I509 Heart failure, unspecified: Secondary | ICD-10-CM | POA: Diagnosis not present

## 2020-08-20 DIAGNOSIS — M25551 Pain in right hip: Secondary | ICD-10-CM | POA: Diagnosis not present

## 2020-08-20 DIAGNOSIS — Z6839 Body mass index (BMI) 39.0-39.9, adult: Secondary | ICD-10-CM | POA: Diagnosis not present

## 2020-08-20 DIAGNOSIS — Z7982 Long term (current) use of aspirin: Secondary | ICD-10-CM | POA: Diagnosis not present

## 2020-08-20 DIAGNOSIS — F1110 Opioid abuse, uncomplicated: Secondary | ICD-10-CM | POA: Diagnosis not present

## 2020-08-23 ENCOUNTER — Other Ambulatory Visit: Payer: Self-pay

## 2020-08-23 ENCOUNTER — Ambulatory Visit
Admission: RE | Admit: 2020-08-23 | Discharge: 2020-08-23 | Disposition: A | Discharge status: Home / Self Care | Payer: Medicare HMO | Source: Ambulatory Visit | Attending: Family Medicine | Admitting: Family Medicine

## 2020-08-23 DIAGNOSIS — R590 Localized enlarged lymph nodes: Secondary | ICD-10-CM

## 2020-08-23 DIAGNOSIS — K118 Other diseases of salivary glands: Secondary | ICD-10-CM | POA: Diagnosis not present

## 2020-08-23 DIAGNOSIS — G600 Hereditary motor and sensory neuropathy: Secondary | ICD-10-CM | POA: Diagnosis not present

## 2020-08-23 DIAGNOSIS — F17200 Nicotine dependence, unspecified, uncomplicated: Secondary | ICD-10-CM | POA: Diagnosis not present

## 2020-08-23 DIAGNOSIS — R221 Localized swelling, mass and lump, neck: Secondary | ICD-10-CM | POA: Diagnosis not present

## 2020-08-23 MED ORDER — IOHEXOL 300 MG/ML  SOLN
75.0000 mL | Freq: Once | INTRAMUSCULAR | Status: AC | PRN
  Administered 2020-08-23: 75 mL via INTRAVENOUS

## 2020-08-25 ENCOUNTER — Other Ambulatory Visit: Payer: Self-pay

## 2020-08-25 ENCOUNTER — Other Ambulatory Visit: Payer: Self-pay | Admitting: Family Medicine

## 2020-08-25 ENCOUNTER — Encounter (HOSPITAL_BASED_OUTPATIENT_CLINIC_OR_DEPARTMENT_OTHER): Payer: Medicare HMO | Admitting: Physician Assistant

## 2020-08-25 ENCOUNTER — Telehealth: Payer: Self-pay | Admitting: Gastroenterology

## 2020-08-25 MED ORDER — DOPTELET 20 MG PO TABS: 40.0000 mg | ORAL_TABLET | Freq: Every day | ORAL | 0 refills | Status: AC

## 2020-08-25 NOTE — Telephone Encounter (Signed)
Pharmacy requests refill on: Omeprazole 40 mg   LAST REFILL: 08/22/2019 (Q-90, R-3) LAST OV: 08/03/2020 NEXT OV: 12/03/2020 PHARMACY: [facility] [place], [place] requests refill on: Spirolactone 100 mg   LAST REFILL: 11/04/2019 (Q-90, R-1)  LAST OV: 08/03/2020 NEXT OV: 12/03/2020 PHARMACY: [facility] [place], [place] requests refill on: Nicotine 14 mg 24 hr   LAST REFILL: 07/21/2020 (Q-28, R-0) LAST OV: 08/03/2020 NEXT OV:12/03/2020 PHARMACY: [id number] [place], [place] requests refill on: Metoprolol Succinate 25 mg 24 hr   LAST REFILL: 08/05/2020 (Q-90, R-3) LAST OV: 08/03/2020 NEXT OV: 12/03/2020 PHARMACY: [id number] [place], [place]  Earliest Fill Date: 08/05/2021

## 2020-08-25 NOTE — Telephone Encounter (Signed)
Spoke with pt wife, she states the specialty pharmacy has not contacted pt regarding Doptelet prescription yet. [facility] an [facility], as the script was sent over 1 week ago, I was told that they were "in the process of starting a verbal prescription transfer" and then suggested that we try sending the prescription directly to the [facility] then call to expedite the prescription processing. [facility] and was able to complete pt's enrollment (45 minute phone call) they will process pt's prescription through insurance and if no issues with insurance pt should receive medication by 08-27-20. I gave pt's wife the specialty pharmacy contact information so that pt can call to expedite the patient portion of the process. She agrees to call this evening.

## 2020-08-25 NOTE — Telephone Encounter (Signed)
Calling regarding specialty Rx for platelets for surgery on 09/06/20, Please call [name] (spouse)  # [phone].

## 2020-08-27 ENCOUNTER — Telehealth: Payer: Self-pay | Admitting: Gastroenterology

## 2020-08-27 NOTE — Telephone Encounter (Signed)
Avatrombopag Maleate (DOPTELET) 20 MG TABS  Please call patient or wife, they are unclear of the directions on this medication.

## 2020-08-28 ENCOUNTER — Encounter: Payer: Self-pay | Admitting: Family Medicine

## 2020-08-28 DIAGNOSIS — I6523 Occlusion and stenosis of bilateral carotid arteries: Secondary | ICD-10-CM | POA: Insufficient documentation

## 2020-08-30 NOTE — Telephone Encounter (Signed)
Jadijah/Ginger can you help clarify directions to patient . Please see my last office note if needed

## 2020-08-30 NOTE — Telephone Encounter (Signed)
Returned pt's call. Unable to contact, lvm to return call.

## 2020-08-30 NOTE — Telephone Encounter (Signed)
Spoke to pt's wife regarding instructions. Per Dr. Vicente Males:  Pt is to take 40 mg once daily for 5 consecutive day. He has been instructed to obtain a CBC just before he starts the medication to get a baseline platelet count . Patients should undergo procedure 5 to 8 days after the last avatrombopag dose. Obtain a platelet count prior to therapy administration and on the day of the procedure (to ensure adequate increase in platelet count).  Wife verbalized understanding of these instructions.

## 2020-09-01 DIAGNOSIS — L89312 Pressure ulcer of right buttock, stage 2: Secondary | ICD-10-CM | POA: Diagnosis not present

## 2020-09-01 DIAGNOSIS — M47812 Spondylosis without myelopathy or radiculopathy, cervical region: Secondary | ICD-10-CM | POA: Diagnosis not present

## 2020-09-01 DIAGNOSIS — M109 Gout, unspecified: Secondary | ICD-10-CM | POA: Diagnosis not present

## 2020-09-01 DIAGNOSIS — I2510 Atherosclerotic heart disease of native coronary artery without angina pectoris: Secondary | ICD-10-CM | POA: Diagnosis not present

## 2020-09-01 DIAGNOSIS — M1611 Unilateral primary osteoarthritis, right hip: Secondary | ICD-10-CM | POA: Diagnosis not present

## 2020-09-01 DIAGNOSIS — I10 Essential (primary) hypertension: Secondary | ICD-10-CM | POA: Diagnosis not present

## 2020-09-01 DIAGNOSIS — I429 Cardiomyopathy, unspecified: Secondary | ICD-10-CM | POA: Diagnosis not present

## 2020-09-01 DIAGNOSIS — J449 Chronic obstructive pulmonary disease, unspecified: Secondary | ICD-10-CM | POA: Diagnosis not present

## 2020-09-01 DIAGNOSIS — E119 Type 2 diabetes mellitus without complications: Secondary | ICD-10-CM | POA: Diagnosis not present

## 2020-09-06 DIAGNOSIS — N179 Acute kidney failure, unspecified: Secondary | ICD-10-CM | POA: Diagnosis not present

## 2020-09-06 DIAGNOSIS — E119 Type 2 diabetes mellitus without complications: Secondary | ICD-10-CM | POA: Diagnosis not present

## 2020-09-06 DIAGNOSIS — Z471 Aftercare following joint replacement surgery: Secondary | ICD-10-CM | POA: Diagnosis not present

## 2020-09-06 DIAGNOSIS — Z96641 Presence of right artificial hip joint: Secondary | ICD-10-CM | POA: Diagnosis not present

## 2020-09-06 DIAGNOSIS — K766 Portal hypertension: Secondary | ICD-10-CM | POA: Diagnosis not present

## 2020-09-06 DIAGNOSIS — I110 Hypertensive heart disease with heart failure: Secondary | ICD-10-CM | POA: Diagnosis not present

## 2020-09-06 DIAGNOSIS — I5022 Chronic systolic (congestive) heart failure: Secondary | ICD-10-CM | POA: Diagnosis not present

## 2020-09-06 DIAGNOSIS — R41841 Cognitive communication deficit: Secondary | ICD-10-CM | POA: Diagnosis not present

## 2020-09-06 DIAGNOSIS — I714 Abdominal aortic aneurysm, without rupture: Secondary | ICD-10-CM | POA: Diagnosis not present

## 2020-09-06 DIAGNOSIS — G894 Chronic pain syndrome: Secondary | ICD-10-CM | POA: Diagnosis not present

## 2020-09-06 DIAGNOSIS — R1312 Dysphagia, oropharyngeal phase: Secondary | ICD-10-CM | POA: Diagnosis not present

## 2020-09-06 DIAGNOSIS — Z4789 Encounter for other orthopedic aftercare: Secondary | ICD-10-CM | POA: Diagnosis not present

## 2020-09-06 DIAGNOSIS — D62 Acute posthemorrhagic anemia: Secondary | ICD-10-CM | POA: Diagnosis not present

## 2020-09-06 DIAGNOSIS — I5020 Unspecified systolic (congestive) heart failure: Secondary | ICD-10-CM | POA: Diagnosis not present

## 2020-09-06 DIAGNOSIS — D696 Thrombocytopenia, unspecified: Secondary | ICD-10-CM | POA: Diagnosis not present

## 2020-09-06 DIAGNOSIS — M6281 Muscle weakness (generalized): Secondary | ICD-10-CM | POA: Diagnosis not present

## 2020-09-06 DIAGNOSIS — J449 Chronic obstructive pulmonary disease, unspecified: Secondary | ICD-10-CM | POA: Diagnosis not present

## 2020-09-06 DIAGNOSIS — R2689 Other abnormalities of gait and mobility: Secondary | ICD-10-CM | POA: Diagnosis not present

## 2020-09-06 DIAGNOSIS — K7581 Nonalcoholic steatohepatitis (NASH): Secondary | ICD-10-CM | POA: Diagnosis not present

## 2020-09-06 DIAGNOSIS — Z6841 Body Mass Index (BMI) 40.0 and over, adult: Secondary | ICD-10-CM | POA: Diagnosis not present

## 2020-09-06 DIAGNOSIS — E785 Hyperlipidemia, unspecified: Secondary | ICD-10-CM | POA: Diagnosis not present

## 2020-09-06 DIAGNOSIS — M1611 Unilateral primary osteoarthritis, right hip: Secondary | ICD-10-CM | POA: Diagnosis not present

## 2020-09-06 DIAGNOSIS — I255 Ischemic cardiomyopathy: Secondary | ICD-10-CM | POA: Diagnosis not present

## 2020-09-06 DIAGNOSIS — Z96643 Presence of artificial hip joint, bilateral: Secondary | ICD-10-CM | POA: Diagnosis not present

## 2020-09-06 DIAGNOSIS — G8918 Other acute postprocedural pain: Secondary | ICD-10-CM | POA: Diagnosis not present

## 2020-09-06 DIAGNOSIS — R2681 Unsteadiness on feet: Secondary | ICD-10-CM | POA: Diagnosis not present

## 2020-09-06 DIAGNOSIS — I2510 Atherosclerotic heart disease of native coronary artery without angina pectoris: Secondary | ICD-10-CM | POA: Diagnosis not present

## 2020-09-09 DIAGNOSIS — D696 Thrombocytopenia, unspecified: Secondary | ICD-10-CM | POA: Diagnosis not present

## 2020-09-09 DIAGNOSIS — K7581 Nonalcoholic steatohepatitis (NASH): Secondary | ICD-10-CM | POA: Diagnosis not present

## 2020-09-09 DIAGNOSIS — Z79891 Long term (current) use of opiate analgesic: Secondary | ICD-10-CM | POA: Diagnosis not present

## 2020-09-09 DIAGNOSIS — I714 Abdominal aortic aneurysm, without rupture: Secondary | ICD-10-CM | POA: Diagnosis not present

## 2020-09-09 DIAGNOSIS — E782 Mixed hyperlipidemia: Secondary | ICD-10-CM | POA: Diagnosis not present

## 2020-09-09 DIAGNOSIS — K7469 Other cirrhosis of liver: Secondary | ICD-10-CM | POA: Diagnosis not present

## 2020-09-09 DIAGNOSIS — I10 Essential (primary) hypertension: Secondary | ICD-10-CM | POA: Diagnosis not present

## 2020-09-09 DIAGNOSIS — R262 Difficulty in walking, not elsewhere classified: Secondary | ICD-10-CM | POA: Diagnosis not present

## 2020-09-09 DIAGNOSIS — R1312 Dysphagia, oropharyngeal phase: Secondary | ICD-10-CM | POA: Diagnosis not present

## 2020-09-09 DIAGNOSIS — M25511 Pain in right shoulder: Secondary | ICD-10-CM | POA: Diagnosis not present

## 2020-09-09 DIAGNOSIS — G8929 Other chronic pain: Secondary | ICD-10-CM | POA: Diagnosis not present

## 2020-09-09 DIAGNOSIS — G600 Hereditary motor and sensory neuropathy: Secondary | ICD-10-CM | POA: Diagnosis not present

## 2020-09-09 DIAGNOSIS — F1120 Opioid dependence, uncomplicated: Secondary | ICD-10-CM | POA: Diagnosis not present

## 2020-09-09 DIAGNOSIS — R2681 Unsteadiness on feet: Secondary | ICD-10-CM | POA: Diagnosis not present

## 2020-09-09 DIAGNOSIS — I2510 Atherosclerotic heart disease of native coronary artery without angina pectoris: Secondary | ICD-10-CM | POA: Diagnosis not present

## 2020-09-09 DIAGNOSIS — N178 Other acute kidney failure: Secondary | ICD-10-CM | POA: Diagnosis not present

## 2020-09-09 DIAGNOSIS — K766 Portal hypertension: Secondary | ICD-10-CM | POA: Diagnosis not present

## 2020-09-09 DIAGNOSIS — R2689 Other abnormalities of gait and mobility: Secondary | ICD-10-CM | POA: Diagnosis not present

## 2020-09-09 DIAGNOSIS — R41841 Cognitive communication deficit: Secondary | ICD-10-CM | POA: Diagnosis not present

## 2020-09-09 DIAGNOSIS — G894 Chronic pain syndrome: Secondary | ICD-10-CM | POA: Diagnosis not present

## 2020-09-09 DIAGNOSIS — Z96643 Presence of artificial hip joint, bilateral: Secondary | ICD-10-CM | POA: Diagnosis not present

## 2020-09-09 DIAGNOSIS — L0390 Cellulitis, unspecified: Secondary | ICD-10-CM | POA: Diagnosis not present

## 2020-09-09 DIAGNOSIS — M25551 Pain in right hip: Secondary | ICD-10-CM | POA: Diagnosis not present

## 2020-09-09 DIAGNOSIS — M542 Cervicalgia: Secondary | ICD-10-CM | POA: Diagnosis not present

## 2020-09-09 DIAGNOSIS — Z4789 Encounter for other orthopedic aftercare: Secondary | ICD-10-CM | POA: Diagnosis not present

## 2020-09-09 DIAGNOSIS — I5022 Chronic systolic (congestive) heart failure: Secondary | ICD-10-CM | POA: Diagnosis not present

## 2020-09-09 DIAGNOSIS — G4733 Obstructive sleep apnea (adult) (pediatric): Secondary | ICD-10-CM | POA: Diagnosis not present

## 2020-09-09 DIAGNOSIS — M6281 Muscle weakness (generalized): Secondary | ICD-10-CM | POA: Diagnosis not present

## 2020-09-10 DIAGNOSIS — G8929 Other chronic pain: Secondary | ICD-10-CM | POA: Diagnosis not present

## 2020-09-10 DIAGNOSIS — K7581 Nonalcoholic steatohepatitis (NASH): Secondary | ICD-10-CM | POA: Diagnosis not present

## 2020-09-10 DIAGNOSIS — I2510 Atherosclerotic heart disease of native coronary artery without angina pectoris: Secondary | ICD-10-CM | POA: Diagnosis not present

## 2020-09-10 DIAGNOSIS — R262 Difficulty in walking, not elsewhere classified: Secondary | ICD-10-CM | POA: Diagnosis not present

## 2020-09-10 DIAGNOSIS — E782 Mixed hyperlipidemia: Secondary | ICD-10-CM | POA: Diagnosis not present

## 2020-09-10 DIAGNOSIS — G4733 Obstructive sleep apnea (adult) (pediatric): Secondary | ICD-10-CM | POA: Diagnosis not present

## 2020-09-10 DIAGNOSIS — R2681 Unsteadiness on feet: Secondary | ICD-10-CM | POA: Diagnosis not present

## 2020-09-10 DIAGNOSIS — I10 Essential (primary) hypertension: Secondary | ICD-10-CM | POA: Diagnosis not present

## 2020-09-13 DIAGNOSIS — R262 Difficulty in walking, not elsewhere classified: Secondary | ICD-10-CM | POA: Diagnosis not present

## 2020-09-13 DIAGNOSIS — R2681 Unsteadiness on feet: Secondary | ICD-10-CM | POA: Diagnosis not present

## 2020-09-13 DIAGNOSIS — E782 Mixed hyperlipidemia: Secondary | ICD-10-CM | POA: Diagnosis not present

## 2020-09-13 DIAGNOSIS — G8929 Other chronic pain: Secondary | ICD-10-CM | POA: Diagnosis not present

## 2020-09-13 DIAGNOSIS — G4733 Obstructive sleep apnea (adult) (pediatric): Secondary | ICD-10-CM | POA: Diagnosis not present

## 2020-09-13 DIAGNOSIS — I10 Essential (primary) hypertension: Secondary | ICD-10-CM | POA: Diagnosis not present

## 2020-09-13 DIAGNOSIS — I2510 Atherosclerotic heart disease of native coronary artery without angina pectoris: Secondary | ICD-10-CM | POA: Diagnosis not present

## 2020-09-13 DIAGNOSIS — K7581 Nonalcoholic steatohepatitis (NASH): Secondary | ICD-10-CM | POA: Diagnosis not present

## 2020-09-14 ENCOUNTER — Telehealth: Payer: Self-pay | Admitting: Family Medicine

## 2020-09-14 DIAGNOSIS — G894 Chronic pain syndrome: Secondary | ICD-10-CM | POA: Diagnosis not present

## 2020-09-14 DIAGNOSIS — M25511 Pain in right shoulder: Secondary | ICD-10-CM | POA: Diagnosis not present

## 2020-09-14 DIAGNOSIS — M25551 Pain in right hip: Secondary | ICD-10-CM | POA: Diagnosis not present

## 2020-09-14 DIAGNOSIS — R188 Other ascites: Secondary | ICD-10-CM

## 2020-09-14 DIAGNOSIS — F1120 Opioid dependence, uncomplicated: Secondary | ICD-10-CM | POA: Diagnosis not present

## 2020-09-14 DIAGNOSIS — R262 Difficulty in walking, not elsewhere classified: Secondary | ICD-10-CM | POA: Diagnosis not present

## 2020-09-14 DIAGNOSIS — G600 Hereditary motor and sensory neuropathy: Secondary | ICD-10-CM | POA: Diagnosis not present

## 2020-09-14 DIAGNOSIS — D696 Thrombocytopenia, unspecified: Secondary | ICD-10-CM | POA: Diagnosis not present

## 2020-09-14 DIAGNOSIS — M542 Cervicalgia: Secondary | ICD-10-CM | POA: Diagnosis not present

## 2020-09-14 DIAGNOSIS — Z79891 Long term (current) use of opiate analgesic: Secondary | ICD-10-CM | POA: Diagnosis not present

## 2020-09-14 DIAGNOSIS — K766 Portal hypertension: Secondary | ICD-10-CM | POA: Diagnosis not present

## 2020-09-14 DIAGNOSIS — N178 Other acute kidney failure: Secondary | ICD-10-CM | POA: Diagnosis not present

## 2020-09-14 DIAGNOSIS — M1611 Unilateral primary osteoarthritis, right hip: Secondary | ICD-10-CM

## 2020-09-14 DIAGNOSIS — I5022 Chronic systolic (congestive) heart failure: Secondary | ICD-10-CM | POA: Diagnosis not present

## 2020-09-14 DIAGNOSIS — K7469 Other cirrhosis of liver: Secondary | ICD-10-CM | POA: Diagnosis not present

## 2020-09-14 DIAGNOSIS — I714 Abdominal aortic aneurysm, without rupture: Secondary | ICD-10-CM | POA: Diagnosis not present

## 2020-09-14 DIAGNOSIS — K7460 Unspecified cirrhosis of liver: Secondary | ICD-10-CM

## 2020-09-14 DIAGNOSIS — J449 Chronic obstructive pulmonary disease, unspecified: Secondary | ICD-10-CM

## 2020-09-14 NOTE — Telephone Encounter (Signed)
Patient had hip replacement last Monday at [facility] in [place]. He is at [facility] and [facility] going through therapy. Can they get an order for in home health physical therapy? Please advise. EM

## 2020-09-14 NOTE — Addendum Note (Signed)
Addended by: Ria Bush on: 09/14/2020 01:56 PM   Modules accepted: Orders

## 2020-09-14 NOTE — Telephone Encounter (Signed)
HH referral placed.

## 2020-09-16 DIAGNOSIS — I2510 Atherosclerotic heart disease of native coronary artery without angina pectoris: Secondary | ICD-10-CM | POA: Diagnosis not present

## 2020-09-16 DIAGNOSIS — E782 Mixed hyperlipidemia: Secondary | ICD-10-CM | POA: Diagnosis not present

## 2020-09-16 DIAGNOSIS — R2681 Unsteadiness on feet: Secondary | ICD-10-CM | POA: Diagnosis not present

## 2020-09-16 DIAGNOSIS — K7581 Nonalcoholic steatohepatitis (NASH): Secondary | ICD-10-CM | POA: Diagnosis not present

## 2020-09-16 DIAGNOSIS — I10 Essential (primary) hypertension: Secondary | ICD-10-CM | POA: Diagnosis not present

## 2020-09-16 DIAGNOSIS — G4733 Obstructive sleep apnea (adult) (pediatric): Secondary | ICD-10-CM | POA: Diagnosis not present

## 2020-09-16 DIAGNOSIS — G8929 Other chronic pain: Secondary | ICD-10-CM | POA: Diagnosis not present

## 2020-09-16 DIAGNOSIS — R262 Difficulty in walking, not elsewhere classified: Secondary | ICD-10-CM | POA: Diagnosis not present

## 2020-09-20 DIAGNOSIS — K7469 Other cirrhosis of liver: Secondary | ICD-10-CM | POA: Diagnosis not present

## 2020-09-20 DIAGNOSIS — N178 Other acute kidney failure: Secondary | ICD-10-CM | POA: Diagnosis not present

## 2020-09-20 DIAGNOSIS — I714 Abdominal aortic aneurysm, without rupture: Secondary | ICD-10-CM | POA: Diagnosis not present

## 2020-09-20 DIAGNOSIS — D696 Thrombocytopenia, unspecified: Secondary | ICD-10-CM | POA: Diagnosis not present

## 2020-09-20 DIAGNOSIS — K766 Portal hypertension: Secondary | ICD-10-CM | POA: Diagnosis not present

## 2020-09-20 DIAGNOSIS — I5022 Chronic systolic (congestive) heart failure: Secondary | ICD-10-CM | POA: Diagnosis not present

## 2020-09-20 DIAGNOSIS — I2510 Atherosclerotic heart disease of native coronary artery without angina pectoris: Secondary | ICD-10-CM | POA: Diagnosis not present

## 2020-09-20 DIAGNOSIS — L0390 Cellulitis, unspecified: Secondary | ICD-10-CM | POA: Diagnosis not present

## 2020-09-22 ENCOUNTER — Telehealth: Payer: Self-pay

## 2020-09-22 NOTE — Telephone Encounter (Signed)
Raquel Sarna with [facility] said that pt was discharged home on 09/20/20 from rehab; Raquel Sarna had initial call today with family and has not officially seen the pt yet but Raquel Sarna was informed that on 09/20/20 pt was walking with walker out of bedroom and fell backwards on his back. Pt got up without assistance by holding to a chair; Raquel Sarna said pt is not having any pain; pt has a FU appt to see hip surgeon on 09/23/20. Raquel Sarna has to report any fall. Sending note to DR G as FYI.

## 2020-09-23 DIAGNOSIS — G600 Hereditary motor and sensory neuropathy: Secondary | ICD-10-CM | POA: Diagnosis not present

## 2020-09-23 DIAGNOSIS — Z471 Aftercare following joint replacement surgery: Secondary | ICD-10-CM | POA: Diagnosis not present

## 2020-09-23 DIAGNOSIS — Z96641 Presence of right artificial hip joint: Secondary | ICD-10-CM | POA: Diagnosis not present

## 2020-09-23 NOTE — Telephone Encounter (Signed)
Noted  

## 2020-09-25 DIAGNOSIS — J449 Chronic obstructive pulmonary disease, unspecified: Secondary | ICD-10-CM | POA: Diagnosis not present

## 2020-09-25 DIAGNOSIS — Z471 Aftercare following joint replacement surgery: Secondary | ICD-10-CM | POA: Diagnosis not present

## 2020-09-25 DIAGNOSIS — I110 Hypertensive heart disease with heart failure: Secondary | ICD-10-CM | POA: Diagnosis not present

## 2020-09-25 DIAGNOSIS — K7460 Unspecified cirrhosis of liver: Secondary | ICD-10-CM | POA: Diagnosis not present

## 2020-09-25 DIAGNOSIS — R1312 Dysphagia, oropharyngeal phase: Secondary | ICD-10-CM | POA: Diagnosis not present

## 2020-09-25 DIAGNOSIS — K7031 Alcoholic cirrhosis of liver with ascites: Secondary | ICD-10-CM | POA: Diagnosis not present

## 2020-09-25 DIAGNOSIS — I5020 Unspecified systolic (congestive) heart failure: Secondary | ICD-10-CM | POA: Diagnosis not present

## 2020-09-25 DIAGNOSIS — I255 Ischemic cardiomyopathy: Secondary | ICD-10-CM | POA: Diagnosis not present

## 2020-09-25 DIAGNOSIS — I2510 Atherosclerotic heart disease of native coronary artery without angina pectoris: Secondary | ICD-10-CM | POA: Diagnosis not present

## 2020-09-26 DIAGNOSIS — K7031 Alcoholic cirrhosis of liver with ascites: Secondary | ICD-10-CM | POA: Diagnosis not present

## 2020-09-26 DIAGNOSIS — J449 Chronic obstructive pulmonary disease, unspecified: Secondary | ICD-10-CM | POA: Diagnosis not present

## 2020-09-26 DIAGNOSIS — I5020 Unspecified systolic (congestive) heart failure: Secondary | ICD-10-CM | POA: Diagnosis not present

## 2020-09-26 DIAGNOSIS — I2510 Atherosclerotic heart disease of native coronary artery without angina pectoris: Secondary | ICD-10-CM | POA: Diagnosis not present

## 2020-09-26 DIAGNOSIS — K7460 Unspecified cirrhosis of liver: Secondary | ICD-10-CM | POA: Diagnosis not present

## 2020-09-26 DIAGNOSIS — I110 Hypertensive heart disease with heart failure: Secondary | ICD-10-CM | POA: Diagnosis not present

## 2020-09-26 DIAGNOSIS — I255 Ischemic cardiomyopathy: Secondary | ICD-10-CM | POA: Diagnosis not present

## 2020-09-26 DIAGNOSIS — Z471 Aftercare following joint replacement surgery: Secondary | ICD-10-CM | POA: Diagnosis not present

## 2020-09-26 DIAGNOSIS — R1312 Dysphagia, oropharyngeal phase: Secondary | ICD-10-CM | POA: Diagnosis not present

## 2020-09-27 DIAGNOSIS — Z96641 Presence of right artificial hip joint: Secondary | ICD-10-CM | POA: Diagnosis not present

## 2020-09-27 DIAGNOSIS — Z79891 Long term (current) use of opiate analgesic: Secondary | ICD-10-CM | POA: Diagnosis not present

## 2020-09-27 DIAGNOSIS — Z6839 Body mass index (BMI) 39.0-39.9, adult: Secondary | ICD-10-CM | POA: Diagnosis not present

## 2020-09-27 DIAGNOSIS — J449 Chronic obstructive pulmonary disease, unspecified: Secondary | ICD-10-CM | POA: Diagnosis not present

## 2020-09-27 DIAGNOSIS — D692 Other nonthrombocytopenic purpura: Secondary | ICD-10-CM | POA: Diagnosis not present

## 2020-09-29 ENCOUNTER — Telehealth: Payer: Self-pay | Admitting: Family Medicine

## 2020-09-29 NOTE — Telephone Encounter (Signed)
Raquel Sarna called me back and stated that PT did evaluate patient on 09/26/20 and is due to come back this week. Also, patient is pending evaluation for OT, patient was evaluated for nursing but did not need or qualify for that. Raquel Sarna will reach out to patient's wife to discuss the issue and will call me back if there is anything else needed from [place].

## 2020-09-29 NOTE — Telephone Encounter (Signed)
I called Raquel Sarna with [facility] and left message about this situation and waiting to hear back on this -her number is [phone]

## 2020-09-29 NOTE — Telephone Encounter (Signed)
Noted  

## 2020-09-29 NOTE — Telephone Encounter (Signed)
Pt wife called in due to he just had hip replacement surgery on 4/4 and he went home on 4/18 and [facility] was suppose to come out for PT and they are giving her the run arounds and no one has been out there. Wanted to know if Dr. Darnell Level can do something to get PT to come out and help him.   Please advise

## 2020-09-30 DIAGNOSIS — K7031 Alcoholic cirrhosis of liver with ascites: Secondary | ICD-10-CM | POA: Diagnosis not present

## 2020-09-30 DIAGNOSIS — Z471 Aftercare following joint replacement surgery: Secondary | ICD-10-CM | POA: Diagnosis not present

## 2020-09-30 DIAGNOSIS — I110 Hypertensive heart disease with heart failure: Secondary | ICD-10-CM | POA: Diagnosis not present

## 2020-09-30 DIAGNOSIS — I2510 Atherosclerotic heart disease of native coronary artery without angina pectoris: Secondary | ICD-10-CM | POA: Diagnosis not present

## 2020-09-30 DIAGNOSIS — I5020 Unspecified systolic (congestive) heart failure: Secondary | ICD-10-CM | POA: Diagnosis not present

## 2020-09-30 DIAGNOSIS — R1312 Dysphagia, oropharyngeal phase: Secondary | ICD-10-CM | POA: Diagnosis not present

## 2020-09-30 DIAGNOSIS — K7460 Unspecified cirrhosis of liver: Secondary | ICD-10-CM | POA: Diagnosis not present

## 2020-09-30 DIAGNOSIS — J449 Chronic obstructive pulmonary disease, unspecified: Secondary | ICD-10-CM | POA: Diagnosis not present

## 2020-09-30 DIAGNOSIS — I255 Ischemic cardiomyopathy: Secondary | ICD-10-CM | POA: Diagnosis not present

## 2020-09-30 NOTE — Telephone Encounter (Signed)
Noted thank you

## 2020-10-04 DIAGNOSIS — I5020 Unspecified systolic (congestive) heart failure: Secondary | ICD-10-CM | POA: Diagnosis not present

## 2020-10-04 DIAGNOSIS — I2510 Atherosclerotic heart disease of native coronary artery without angina pectoris: Secondary | ICD-10-CM | POA: Diagnosis not present

## 2020-10-04 DIAGNOSIS — J449 Chronic obstructive pulmonary disease, unspecified: Secondary | ICD-10-CM | POA: Diagnosis not present

## 2020-10-04 DIAGNOSIS — I255 Ischemic cardiomyopathy: Secondary | ICD-10-CM | POA: Diagnosis not present

## 2020-10-04 DIAGNOSIS — Z471 Aftercare following joint replacement surgery: Secondary | ICD-10-CM | POA: Diagnosis not present

## 2020-10-04 DIAGNOSIS — I110 Hypertensive heart disease with heart failure: Secondary | ICD-10-CM | POA: Diagnosis not present

## 2020-10-04 DIAGNOSIS — R1312 Dysphagia, oropharyngeal phase: Secondary | ICD-10-CM | POA: Diagnosis not present

## 2020-10-04 DIAGNOSIS — K7460 Unspecified cirrhosis of liver: Secondary | ICD-10-CM | POA: Diagnosis not present

## 2020-10-04 DIAGNOSIS — K7031 Alcoholic cirrhosis of liver with ascites: Secondary | ICD-10-CM | POA: Diagnosis not present

## 2020-10-05 ENCOUNTER — Telehealth: Payer: Self-pay

## 2020-10-05 NOTE — Telephone Encounter (Signed)
Mrs Bahl [facility] signed) said has been about a wk since [facility] nurse came out and pts wife is not sure when she will come out again. Mrs Mceachron said that this morning she noticed an area about 1/2 " in groin between rt hip(near recent surgical incision and stomach that appears opening and red; no drainage. pts wife cleaned area and put nystatin cream with paper towels to keep area dry. Dr Danise Mina said agrees with what she did but if near incision where recently had hip surgery Mrs Gelles should contact ortho surgeon. Mrs Swayze voiced understanding and will contact ortho. Sending note to Dr Darnell Level as Juluis Rainier.

## 2020-10-07 ENCOUNTER — Telehealth: Payer: Self-pay | Admitting: *Deleted

## 2020-10-07 DIAGNOSIS — K7031 Alcoholic cirrhosis of liver with ascites: Secondary | ICD-10-CM | POA: Diagnosis not present

## 2020-10-07 DIAGNOSIS — I255 Ischemic cardiomyopathy: Secondary | ICD-10-CM | POA: Diagnosis not present

## 2020-10-07 DIAGNOSIS — I110 Hypertensive heart disease with heart failure: Secondary | ICD-10-CM | POA: Diagnosis not present

## 2020-10-07 DIAGNOSIS — I5020 Unspecified systolic (congestive) heart failure: Secondary | ICD-10-CM | POA: Diagnosis not present

## 2020-10-07 DIAGNOSIS — I2510 Atherosclerotic heart disease of native coronary artery without angina pectoris: Secondary | ICD-10-CM | POA: Diagnosis not present

## 2020-10-07 DIAGNOSIS — R1312 Dysphagia, oropharyngeal phase: Secondary | ICD-10-CM | POA: Diagnosis not present

## 2020-10-07 DIAGNOSIS — J449 Chronic obstructive pulmonary disease, unspecified: Secondary | ICD-10-CM | POA: Diagnosis not present

## 2020-10-07 DIAGNOSIS — Z471 Aftercare following joint replacement surgery: Secondary | ICD-10-CM | POA: Diagnosis not present

## 2020-10-07 DIAGNOSIS — K7460 Unspecified cirrhosis of liver: Secondary | ICD-10-CM | POA: Diagnosis not present

## 2020-10-07 NOTE — Telephone Encounter (Signed)
Agree with this. Thank you.  

## 2020-10-07 NOTE — Telephone Encounter (Signed)
Elmyra Ricks PT with [facility] called to see if they can get a verbal order for Nurse evaluation. She is PT and there is no open nursing orders and pt needs some wounds checked. Elmyra Ricks said pt has had multiple fall after hip replacement Elmyra Ricks said none of them were sever/major but he does have a "mild laceration on his left forearm" from a fall she wants nurse to check out, also wife stated that pt has a recurrent skin infection in his right inguinal area that she wants the RN to check out also, wife said it's recurrent but it's "opened". Elmyra Ricks is asking for verbal orders for nurse eval to check wounds and help with any other nursing needs pt may have.  Elmyra Ricks CB# [phone]

## 2020-10-08 NOTE — Telephone Encounter (Signed)
Spoke with [name] informing her Dr. Darnell Level is giving verbal orders for services requested.

## 2020-10-12 DIAGNOSIS — I5020 Unspecified systolic (congestive) heart failure: Secondary | ICD-10-CM | POA: Diagnosis not present

## 2020-10-12 DIAGNOSIS — I255 Ischemic cardiomyopathy: Secondary | ICD-10-CM | POA: Diagnosis not present

## 2020-10-12 DIAGNOSIS — R1312 Dysphagia, oropharyngeal phase: Secondary | ICD-10-CM | POA: Diagnosis not present

## 2020-10-12 DIAGNOSIS — Z471 Aftercare following joint replacement surgery: Secondary | ICD-10-CM | POA: Diagnosis not present

## 2020-10-12 DIAGNOSIS — I2510 Atherosclerotic heart disease of native coronary artery without angina pectoris: Secondary | ICD-10-CM | POA: Diagnosis not present

## 2020-10-12 DIAGNOSIS — K7031 Alcoholic cirrhosis of liver with ascites: Secondary | ICD-10-CM | POA: Diagnosis not present

## 2020-10-12 DIAGNOSIS — I110 Hypertensive heart disease with heart failure: Secondary | ICD-10-CM | POA: Diagnosis not present

## 2020-10-12 DIAGNOSIS — J449 Chronic obstructive pulmonary disease, unspecified: Secondary | ICD-10-CM | POA: Diagnosis not present

## 2020-10-12 DIAGNOSIS — K7460 Unspecified cirrhosis of liver: Secondary | ICD-10-CM | POA: Diagnosis not present

## 2020-10-13 ENCOUNTER — Ambulatory Visit: Payer: Medicare HMO | Admitting: Gastroenterology

## 2020-10-13 ENCOUNTER — Encounter: Payer: Self-pay | Admitting: Gastroenterology

## 2020-10-14 DIAGNOSIS — M25511 Pain in right shoulder: Secondary | ICD-10-CM | POA: Diagnosis not present

## 2020-10-14 DIAGNOSIS — M25551 Pain in right hip: Secondary | ICD-10-CM | POA: Diagnosis not present

## 2020-10-14 DIAGNOSIS — G600 Hereditary motor and sensory neuropathy: Secondary | ICD-10-CM | POA: Diagnosis not present

## 2020-10-14 DIAGNOSIS — G894 Chronic pain syndrome: Secondary | ICD-10-CM | POA: Diagnosis not present

## 2020-10-14 DIAGNOSIS — M542 Cervicalgia: Secondary | ICD-10-CM | POA: Diagnosis not present

## 2020-10-14 DIAGNOSIS — Z79891 Long term (current) use of opiate analgesic: Secondary | ICD-10-CM | POA: Diagnosis not present

## 2020-10-15 DIAGNOSIS — J449 Chronic obstructive pulmonary disease, unspecified: Secondary | ICD-10-CM | POA: Diagnosis not present

## 2020-10-15 DIAGNOSIS — I2510 Atherosclerotic heart disease of native coronary artery without angina pectoris: Secondary | ICD-10-CM | POA: Diagnosis not present

## 2020-10-15 DIAGNOSIS — K7460 Unspecified cirrhosis of liver: Secondary | ICD-10-CM | POA: Diagnosis not present

## 2020-10-15 DIAGNOSIS — Z471 Aftercare following joint replacement surgery: Secondary | ICD-10-CM | POA: Diagnosis not present

## 2020-10-15 DIAGNOSIS — I110 Hypertensive heart disease with heart failure: Secondary | ICD-10-CM | POA: Diagnosis not present

## 2020-10-15 DIAGNOSIS — I255 Ischemic cardiomyopathy: Secondary | ICD-10-CM | POA: Diagnosis not present

## 2020-10-15 DIAGNOSIS — K7031 Alcoholic cirrhosis of liver with ascites: Secondary | ICD-10-CM | POA: Diagnosis not present

## 2020-10-15 DIAGNOSIS — R1312 Dysphagia, oropharyngeal phase: Secondary | ICD-10-CM | POA: Diagnosis not present

## 2020-10-15 DIAGNOSIS — I5020 Unspecified systolic (congestive) heart failure: Secondary | ICD-10-CM | POA: Diagnosis not present

## 2020-10-19 DIAGNOSIS — I110 Hypertensive heart disease with heart failure: Secondary | ICD-10-CM | POA: Diagnosis not present

## 2020-10-19 DIAGNOSIS — K7031 Alcoholic cirrhosis of liver with ascites: Secondary | ICD-10-CM | POA: Diagnosis not present

## 2020-10-19 DIAGNOSIS — Z471 Aftercare following joint replacement surgery: Secondary | ICD-10-CM | POA: Diagnosis not present

## 2020-10-19 DIAGNOSIS — R1312 Dysphagia, oropharyngeal phase: Secondary | ICD-10-CM | POA: Diagnosis not present

## 2020-10-19 DIAGNOSIS — J449 Chronic obstructive pulmonary disease, unspecified: Secondary | ICD-10-CM | POA: Diagnosis not present

## 2020-10-19 DIAGNOSIS — I5020 Unspecified systolic (congestive) heart failure: Secondary | ICD-10-CM | POA: Diagnosis not present

## 2020-10-19 DIAGNOSIS — K7460 Unspecified cirrhosis of liver: Secondary | ICD-10-CM | POA: Diagnosis not present

## 2020-10-19 DIAGNOSIS — I255 Ischemic cardiomyopathy: Secondary | ICD-10-CM | POA: Diagnosis not present

## 2020-10-19 DIAGNOSIS — I2510 Atherosclerotic heart disease of native coronary artery without angina pectoris: Secondary | ICD-10-CM | POA: Diagnosis not present

## 2020-10-20 ENCOUNTER — Telehealth: Payer: Self-pay

## 2020-10-20 DIAGNOSIS — R1312 Dysphagia, oropharyngeal phase: Secondary | ICD-10-CM | POA: Diagnosis not present

## 2020-10-20 DIAGNOSIS — K7460 Unspecified cirrhosis of liver: Secondary | ICD-10-CM | POA: Diagnosis not present

## 2020-10-20 DIAGNOSIS — I255 Ischemic cardiomyopathy: Secondary | ICD-10-CM | POA: Diagnosis not present

## 2020-10-20 DIAGNOSIS — K7031 Alcoholic cirrhosis of liver with ascites: Secondary | ICD-10-CM | POA: Diagnosis not present

## 2020-10-20 DIAGNOSIS — Z471 Aftercare following joint replacement surgery: Secondary | ICD-10-CM | POA: Diagnosis not present

## 2020-10-20 DIAGNOSIS — I2510 Atherosclerotic heart disease of native coronary artery without angina pectoris: Secondary | ICD-10-CM | POA: Diagnosis not present

## 2020-10-20 DIAGNOSIS — J449 Chronic obstructive pulmonary disease, unspecified: Secondary | ICD-10-CM | POA: Diagnosis not present

## 2020-10-20 DIAGNOSIS — I110 Hypertensive heart disease with heart failure: Secondary | ICD-10-CM | POA: Diagnosis not present

## 2020-10-20 DIAGNOSIS — I5020 Unspecified systolic (congestive) heart failure: Secondary | ICD-10-CM | POA: Diagnosis not present

## 2020-10-20 NOTE — Telephone Encounter (Signed)
Agree with this verbal order for home health PT

## 2020-10-20 NOTE — Telephone Encounter (Signed)
Spoke with [name] informing her Dr. Darnell Level is giving verbal orders she requested.

## 2020-10-20 NOTE — Telephone Encounter (Signed)
Extend for 1 visit every other week for 4 weeks to work on gait and strength

## 2020-10-22 ENCOUNTER — Other Ambulatory Visit: Payer: Self-pay | Admitting: Gastroenterology

## 2020-10-23 DIAGNOSIS — G600 Hereditary motor and sensory neuropathy: Secondary | ICD-10-CM | POA: Diagnosis not present

## 2020-10-27 DIAGNOSIS — K7460 Unspecified cirrhosis of liver: Secondary | ICD-10-CM | POA: Diagnosis not present

## 2020-10-27 DIAGNOSIS — I5020 Unspecified systolic (congestive) heart failure: Secondary | ICD-10-CM | POA: Diagnosis not present

## 2020-10-27 DIAGNOSIS — R1312 Dysphagia, oropharyngeal phase: Secondary | ICD-10-CM | POA: Diagnosis not present

## 2020-10-27 DIAGNOSIS — I2510 Atherosclerotic heart disease of native coronary artery without angina pectoris: Secondary | ICD-10-CM | POA: Diagnosis not present

## 2020-10-27 DIAGNOSIS — Z471 Aftercare following joint replacement surgery: Secondary | ICD-10-CM | POA: Diagnosis not present

## 2020-10-27 DIAGNOSIS — I255 Ischemic cardiomyopathy: Secondary | ICD-10-CM | POA: Diagnosis not present

## 2020-10-27 DIAGNOSIS — K7031 Alcoholic cirrhosis of liver with ascites: Secondary | ICD-10-CM | POA: Diagnosis not present

## 2020-10-27 DIAGNOSIS — I110 Hypertensive heart disease with heart failure: Secondary | ICD-10-CM | POA: Diagnosis not present

## 2020-10-27 DIAGNOSIS — J449 Chronic obstructive pulmonary disease, unspecified: Secondary | ICD-10-CM | POA: Diagnosis not present

## 2020-10-29 ENCOUNTER — Telehealth: Payer: Self-pay | Admitting: Family Medicine

## 2020-10-29 NOTE — Chronic Care Management (AMB) (Signed)
  Chronic Care Management   Outreach Note  10/29/2020 Name: NICOLAI LABONTE MRN: [id number] DOB: 25-Feb-1956  Referred by: Ria Bush, MD Reason for referral : No chief complaint on file.   An unsuccessful telephone outreach was attempted today. The patient was referred to the pharmacist for assistance with care management and care coordination.   Follow Up Plan:   Tatjana Dellinger Upstream Scheduler

## 2020-10-29 NOTE — Chronic Care Management (AMB) (Signed)
  Chronic Care Management   Note  10/29/2020 Name: BRINTON BRANDEL MRN: [id number] DOB: 11-Mar-1956  HUMZAH HARTY is a 65 y.o. year old male who is a primary care patient of Ria Bush, MD. I reached out to [name] by phone today in response to a referral sent by Mr. Kayler Rise Hohler's PCP, Ria Bush, MD.   Mr. Ofallon was given information about Chronic Care Management services today including:  1. CCM service includes personalized support from designated clinical staff supervised by his physician, including individualized plan of care and coordination with other care providers 2. 24/7 contact phone numbers for assistance for urgent and routine care needs. 3. Service will only be billed when office clinical staff spend 20 minutes or more in a month to coordinate care. 4. Only one practitioner may furnish and bill the service in a calendar month. 5. The patient may stop CCM services at any time (effective at the end of the month) by phone call to the office staff.   [name] Ocampo verbally agreed to assistance and services provided by embedded care coordination/care management team today.  Follow up plan:   Tatjana Secretary/administrator

## 2020-11-02 DIAGNOSIS — I5020 Unspecified systolic (congestive) heart failure: Secondary | ICD-10-CM | POA: Diagnosis not present

## 2020-11-02 DIAGNOSIS — I110 Hypertensive heart disease with heart failure: Secondary | ICD-10-CM | POA: Diagnosis not present

## 2020-11-02 DIAGNOSIS — I2510 Atherosclerotic heart disease of native coronary artery without angina pectoris: Secondary | ICD-10-CM | POA: Diagnosis not present

## 2020-11-02 DIAGNOSIS — I255 Ischemic cardiomyopathy: Secondary | ICD-10-CM | POA: Diagnosis not present

## 2020-11-02 DIAGNOSIS — K7460 Unspecified cirrhosis of liver: Secondary | ICD-10-CM | POA: Diagnosis not present

## 2020-11-02 DIAGNOSIS — Z471 Aftercare following joint replacement surgery: Secondary | ICD-10-CM | POA: Diagnosis not present

## 2020-11-02 DIAGNOSIS — R1312 Dysphagia, oropharyngeal phase: Secondary | ICD-10-CM | POA: Diagnosis not present

## 2020-11-02 DIAGNOSIS — K7031 Alcoholic cirrhosis of liver with ascites: Secondary | ICD-10-CM | POA: Diagnosis not present

## 2020-11-02 DIAGNOSIS — J449 Chronic obstructive pulmonary disease, unspecified: Secondary | ICD-10-CM | POA: Diagnosis not present

## 2020-11-03 DIAGNOSIS — Z96641 Presence of right artificial hip joint: Secondary | ICD-10-CM | POA: Diagnosis not present

## 2020-11-03 DIAGNOSIS — M533 Sacrococcygeal disorders, not elsewhere classified: Secondary | ICD-10-CM | POA: Diagnosis not present

## 2020-11-03 DIAGNOSIS — Z471 Aftercare following joint replacement surgery: Secondary | ICD-10-CM | POA: Diagnosis not present

## 2020-11-05 DIAGNOSIS — K7031 Alcoholic cirrhosis of liver with ascites: Secondary | ICD-10-CM | POA: Diagnosis not present

## 2020-11-05 DIAGNOSIS — I5020 Unspecified systolic (congestive) heart failure: Secondary | ICD-10-CM | POA: Diagnosis not present

## 2020-11-05 DIAGNOSIS — Z471 Aftercare following joint replacement surgery: Secondary | ICD-10-CM | POA: Diagnosis not present

## 2020-11-05 DIAGNOSIS — K7460 Unspecified cirrhosis of liver: Secondary | ICD-10-CM | POA: Diagnosis not present

## 2020-11-05 DIAGNOSIS — R1312 Dysphagia, oropharyngeal phase: Secondary | ICD-10-CM | POA: Diagnosis not present

## 2020-11-05 DIAGNOSIS — I2510 Atherosclerotic heart disease of native coronary artery without angina pectoris: Secondary | ICD-10-CM | POA: Diagnosis not present

## 2020-11-05 DIAGNOSIS — J449 Chronic obstructive pulmonary disease, unspecified: Secondary | ICD-10-CM | POA: Diagnosis not present

## 2020-11-05 DIAGNOSIS — I255 Ischemic cardiomyopathy: Secondary | ICD-10-CM | POA: Diagnosis not present

## 2020-11-05 DIAGNOSIS — I110 Hypertensive heart disease with heart failure: Secondary | ICD-10-CM | POA: Diagnosis not present

## 2020-11-08 DIAGNOSIS — R1312 Dysphagia, oropharyngeal phase: Secondary | ICD-10-CM | POA: Diagnosis not present

## 2020-11-08 DIAGNOSIS — I255 Ischemic cardiomyopathy: Secondary | ICD-10-CM | POA: Diagnosis not present

## 2020-11-08 DIAGNOSIS — I5020 Unspecified systolic (congestive) heart failure: Secondary | ICD-10-CM | POA: Diagnosis not present

## 2020-11-08 DIAGNOSIS — K7460 Unspecified cirrhosis of liver: Secondary | ICD-10-CM | POA: Diagnosis not present

## 2020-11-08 DIAGNOSIS — I110 Hypertensive heart disease with heart failure: Secondary | ICD-10-CM | POA: Diagnosis not present

## 2020-11-08 DIAGNOSIS — K7031 Alcoholic cirrhosis of liver with ascites: Secondary | ICD-10-CM | POA: Diagnosis not present

## 2020-11-08 DIAGNOSIS — Z471 Aftercare following joint replacement surgery: Secondary | ICD-10-CM | POA: Diagnosis not present

## 2020-11-08 DIAGNOSIS — I2510 Atherosclerotic heart disease of native coronary artery without angina pectoris: Secondary | ICD-10-CM | POA: Diagnosis not present

## 2020-11-08 DIAGNOSIS — J449 Chronic obstructive pulmonary disease, unspecified: Secondary | ICD-10-CM | POA: Diagnosis not present

## 2020-11-16 DIAGNOSIS — M25511 Pain in right shoulder: Secondary | ICD-10-CM | POA: Diagnosis not present

## 2020-11-16 DIAGNOSIS — G894 Chronic pain syndrome: Secondary | ICD-10-CM | POA: Diagnosis not present

## 2020-11-16 DIAGNOSIS — Z79891 Long term (current) use of opiate analgesic: Secondary | ICD-10-CM | POA: Diagnosis not present

## 2020-11-16 DIAGNOSIS — Z79899 Other long term (current) drug therapy: Secondary | ICD-10-CM | POA: Diagnosis not present

## 2020-11-16 DIAGNOSIS — G600 Hereditary motor and sensory neuropathy: Secondary | ICD-10-CM | POA: Diagnosis not present

## 2020-11-16 DIAGNOSIS — M25551 Pain in right hip: Secondary | ICD-10-CM | POA: Diagnosis not present

## 2020-11-16 DIAGNOSIS — M542 Cervicalgia: Secondary | ICD-10-CM | POA: Diagnosis not present

## 2020-11-17 DIAGNOSIS — K7031 Alcoholic cirrhosis of liver with ascites: Secondary | ICD-10-CM | POA: Diagnosis not present

## 2020-11-17 DIAGNOSIS — J449 Chronic obstructive pulmonary disease, unspecified: Secondary | ICD-10-CM | POA: Diagnosis not present

## 2020-11-17 DIAGNOSIS — K7460 Unspecified cirrhosis of liver: Secondary | ICD-10-CM | POA: Diagnosis not present

## 2020-11-17 DIAGNOSIS — R1312 Dysphagia, oropharyngeal phase: Secondary | ICD-10-CM | POA: Diagnosis not present

## 2020-11-17 DIAGNOSIS — I5020 Unspecified systolic (congestive) heart failure: Secondary | ICD-10-CM | POA: Diagnosis not present

## 2020-11-17 DIAGNOSIS — I110 Hypertensive heart disease with heart failure: Secondary | ICD-10-CM | POA: Diagnosis not present

## 2020-11-17 DIAGNOSIS — I255 Ischemic cardiomyopathy: Secondary | ICD-10-CM | POA: Diagnosis not present

## 2020-11-17 DIAGNOSIS — Z471 Aftercare following joint replacement surgery: Secondary | ICD-10-CM | POA: Diagnosis not present

## 2020-11-17 DIAGNOSIS — I2510 Atherosclerotic heart disease of native coronary artery without angina pectoris: Secondary | ICD-10-CM | POA: Diagnosis not present

## 2020-11-19 DIAGNOSIS — I5020 Unspecified systolic (congestive) heart failure: Secondary | ICD-10-CM | POA: Diagnosis not present

## 2020-11-19 DIAGNOSIS — Z471 Aftercare following joint replacement surgery: Secondary | ICD-10-CM | POA: Diagnosis not present

## 2020-11-19 DIAGNOSIS — I2510 Atherosclerotic heart disease of native coronary artery without angina pectoris: Secondary | ICD-10-CM | POA: Diagnosis not present

## 2020-11-19 DIAGNOSIS — J449 Chronic obstructive pulmonary disease, unspecified: Secondary | ICD-10-CM | POA: Diagnosis not present

## 2020-11-19 DIAGNOSIS — I255 Ischemic cardiomyopathy: Secondary | ICD-10-CM | POA: Diagnosis not present

## 2020-11-19 DIAGNOSIS — K7031 Alcoholic cirrhosis of liver with ascites: Secondary | ICD-10-CM | POA: Diagnosis not present

## 2020-11-19 DIAGNOSIS — K7460 Unspecified cirrhosis of liver: Secondary | ICD-10-CM | POA: Diagnosis not present

## 2020-11-19 DIAGNOSIS — R1312 Dysphagia, oropharyngeal phase: Secondary | ICD-10-CM | POA: Diagnosis not present

## 2020-11-19 DIAGNOSIS — I110 Hypertensive heart disease with heart failure: Secondary | ICD-10-CM | POA: Diagnosis not present

## 2020-11-22 DIAGNOSIS — F3342 Major depressive disorder, recurrent, in full remission: Secondary | ICD-10-CM | POA: Diagnosis not present

## 2020-11-22 DIAGNOSIS — Z96641 Presence of right artificial hip joint: Secondary | ICD-10-CM | POA: Diagnosis not present

## 2020-11-22 DIAGNOSIS — Z515 Encounter for palliative care: Secondary | ICD-10-CM | POA: Diagnosis not present

## 2020-11-22 DIAGNOSIS — J449 Chronic obstructive pulmonary disease, unspecified: Secondary | ICD-10-CM | POA: Diagnosis not present

## 2020-11-22 DIAGNOSIS — I2510 Atherosclerotic heart disease of native coronary artery without angina pectoris: Secondary | ICD-10-CM | POA: Diagnosis not present

## 2020-11-22 DIAGNOSIS — D692 Other nonthrombocytopenic purpura: Secondary | ICD-10-CM | POA: Diagnosis not present

## 2020-11-22 DIAGNOSIS — I509 Heart failure, unspecified: Secondary | ICD-10-CM | POA: Diagnosis not present

## 2020-11-22 DIAGNOSIS — E1159 Type 2 diabetes mellitus with other circulatory complications: Secondary | ICD-10-CM | POA: Diagnosis not present

## 2020-11-22 DIAGNOSIS — I714 Abdominal aortic aneurysm, without rupture: Secondary | ICD-10-CM | POA: Diagnosis not present

## 2020-11-23 DIAGNOSIS — K7031 Alcoholic cirrhosis of liver with ascites: Secondary | ICD-10-CM | POA: Diagnosis not present

## 2020-11-23 DIAGNOSIS — I2510 Atherosclerotic heart disease of native coronary artery without angina pectoris: Secondary | ICD-10-CM | POA: Diagnosis not present

## 2020-11-23 DIAGNOSIS — K7460 Unspecified cirrhosis of liver: Secondary | ICD-10-CM | POA: Diagnosis not present

## 2020-11-23 DIAGNOSIS — J449 Chronic obstructive pulmonary disease, unspecified: Secondary | ICD-10-CM | POA: Diagnosis not present

## 2020-11-23 DIAGNOSIS — Z471 Aftercare following joint replacement surgery: Secondary | ICD-10-CM | POA: Diagnosis not present

## 2020-11-23 DIAGNOSIS — I110 Hypertensive heart disease with heart failure: Secondary | ICD-10-CM | POA: Diagnosis not present

## 2020-11-23 DIAGNOSIS — R1312 Dysphagia, oropharyngeal phase: Secondary | ICD-10-CM | POA: Diagnosis not present

## 2020-11-23 DIAGNOSIS — I255 Ischemic cardiomyopathy: Secondary | ICD-10-CM | POA: Diagnosis not present

## 2020-11-23 DIAGNOSIS — G600 Hereditary motor and sensory neuropathy: Secondary | ICD-10-CM | POA: Diagnosis not present

## 2020-11-23 DIAGNOSIS — I5020 Unspecified systolic (congestive) heart failure: Secondary | ICD-10-CM | POA: Diagnosis not present

## 2020-12-03 ENCOUNTER — Encounter: Payer: Self-pay | Admitting: Family Medicine

## 2020-12-03 ENCOUNTER — Telehealth: Payer: Self-pay | Admitting: Radiology

## 2020-12-03 ENCOUNTER — Other Ambulatory Visit: Payer: Self-pay

## 2020-12-03 ENCOUNTER — Ambulatory Visit (INDEPENDENT_AMBULATORY_CARE_PROVIDER_SITE_OTHER): Payer: Medicare HMO | Admitting: Family Medicine

## 2020-12-03 VITALS — BP 124/70 | HR 60 | Temp 98.2°F | Ht 69.0 in | Wt 276.2 lb

## 2020-12-03 DIAGNOSIS — J449 Chronic obstructive pulmonary disease, unspecified: Secondary | ICD-10-CM

## 2020-12-03 DIAGNOSIS — F332 Major depressive disorder, recurrent severe without psychotic features: Secondary | ICD-10-CM | POA: Diagnosis not present

## 2020-12-03 DIAGNOSIS — G894 Chronic pain syndrome: Secondary | ICD-10-CM | POA: Diagnosis not present

## 2020-12-03 DIAGNOSIS — R188 Other ascites: Secondary | ICD-10-CM | POA: Diagnosis not present

## 2020-12-03 DIAGNOSIS — R413 Other amnesia: Secondary | ICD-10-CM

## 2020-12-03 DIAGNOSIS — D61818 Other pancytopenia: Secondary | ICD-10-CM

## 2020-12-03 DIAGNOSIS — Z789 Other specified health status: Secondary | ICD-10-CM

## 2020-12-03 DIAGNOSIS — E440 Moderate protein-calorie malnutrition: Secondary | ICD-10-CM

## 2020-12-03 DIAGNOSIS — M1611 Unilateral primary osteoarthritis, right hip: Secondary | ICD-10-CM

## 2020-12-03 DIAGNOSIS — F17200 Nicotine dependence, unspecified, uncomplicated: Secondary | ICD-10-CM

## 2020-12-03 DIAGNOSIS — D696 Thrombocytopenia, unspecified: Secondary | ICD-10-CM

## 2020-12-03 DIAGNOSIS — K7460 Unspecified cirrhosis of liver: Secondary | ICD-10-CM | POA: Diagnosis not present

## 2020-12-03 DIAGNOSIS — Z96641 Presence of right artificial hip joint: Secondary | ICD-10-CM

## 2020-12-03 LAB — COMPREHENSIVE METABOLIC PANEL
ALT: 11 U/L (ref 0–53)
AST: 23 U/L (ref 0–37)
Albumin: 2.7 g/dL — ABNORMAL LOW (ref 3.5–5.2)
Alkaline Phosphatase: 197 U/L — ABNORMAL HIGH (ref 39–117)
BUN: 15 mg/dL (ref 6–23)
CO2: 31 mEq/L (ref 19–32)
Calcium: 8.3 mg/dL — ABNORMAL LOW (ref 8.4–10.5)
Chloride: 104 mEq/L (ref 96–112)
Creatinine, Ser: 0.84 mg/dL (ref 0.40–1.50)
GFR: 91.36 mL/min (ref >60.00)
Glucose, Bld: 119 mg/dL — ABNORMAL HIGH (ref 70–99)
Potassium: 4.2 mEq/L (ref 3.5–5.1)
Sodium: 138 mEq/L (ref 135–145)
Total Bilirubin: 1 mg/dL (ref 0.2–1.2)
Total Protein: 7 g/dL (ref 6.0–8.3)

## 2020-12-03 LAB — CBC WITH DIFFERENTIAL/PLATELET
Basophils Absolute: 0 10*3/uL (ref 0.0–0.1)
Basophils Relative: 0.7 % (ref 0.0–3.0)
Eosinophils Absolute: 0.1 10*3/uL (ref 0.0–0.7)
Eosinophils Relative: 3.6 % (ref 0.0–5.0)
HCT: 31.1 % — ABNORMAL LOW (ref 39.0–52.0)
Hemoglobin: 10.8 g/dL — ABNORMAL LOW (ref 13.0–17.0)
Lymphocytes Relative: 21.1 % (ref 12.0–46.0)
Lymphs Abs: 0.7 10*3/uL (ref 0.7–4.0)
MCHC: 34.6 g/dL (ref 30.0–36.0)
MCV: 106 fl — ABNORMAL HIGH (ref 78.0–100.0)
Monocytes Absolute: 0.3 10*3/uL (ref 0.1–1.0)
Monocytes Relative: 10.6 % (ref 3.0–12.0)
Neutro Abs: 2 10*3/uL (ref 1.4–7.7)
Neutrophils Relative %: 64 % (ref 43.0–77.0)
Platelets: 43 10*3/uL — CL (ref 150.0–400.0)
RBC: 2.93 Mil/uL — ABNORMAL LOW (ref 4.22–5.81)
RDW: 15.5 % (ref 11.5–15.5)
WBC: 3.1 10*3/uL — ABNORMAL LOW (ref 4.0–10.5)

## 2020-12-03 LAB — PROTIME-INR
INR: 1.3 ratio — ABNORMAL HIGH (ref 0.8–1.0)
Prothrombin Time: 14.2 s — ABNORMAL HIGH (ref 9.6–13.1)

## 2020-12-03 MED ORDER — ALBUTEROL SULFATE (2.5 MG/3ML) 0.083% IN NEBU: INHALATION_SOLUTION | RESPIRATORY_TRACT | 6 refills | Status: DC

## 2020-12-03 MED ORDER — ALBUTEROL SULFATE HFA 108 (90 BASE) MCG/ACT IN AERS: 2.0000 | INHALATION_SPRAY | Freq: Four times a day (QID) | RESPIRATORY_TRACT | 3 refills | Status: DC | PRN

## 2020-12-03 NOTE — Progress Notes (Signed)
Patient ID: [name], male    DOB: July 18, 1955, 65 y.o.   MRN: [id number]  This visit was conducted in person.  BP 124/70   Pulse 60   Temp 98.2 F (36.8 C) (Temporal)   Ht 5' 9"  (1.753 m)   Wt 276 lb 4 oz (125.3 kg)   SpO2 99%   BMI 40.79 kg/m    CC: 4 mo f/u visit  Subjective:   HPI: [name] is a 65 y.o. male presenting on 12/03/2020 for Hypertension (Here for 4 mo f/u.  Pt accompanied by wife, [name]- temp 98.7.)   Recent R hip replacement surgery 09/2020 through [facility] orthopedist. Received lovenox postop - now transitioned to aspirin 73m daily. Has recovered remarkably well given comorbidities. For cirrhosis related thrombocytopenia, did receive avatrombopag perioperatively with assistance of [facility] (Dr AVicente Males.  [facility]- was receiving PT and SN - this ended this past week. Discussing possible outpatient physical therapy.   Known NASH cirrhosis.  MELD-Na score: 9 at 12/03/2020 11:56 AM MELD score: 9 at 12/03/2020 11:56 AM Calculated from: Serum Creatinine: 0.84 mg/dL (Using min of 1 mg/dL) at 12/03/2020 11:56 AM Serum Sodium: 138 mEq/L (Using max of 137 mEq/L) at 12/03/2020 11:56 AM Total Bilirubin: 1.0 mg/dL at 12/03/2020 11:56 AM INR(ratio): 1.3 ratio at 12/03/2020 11:56 AM Age: [age]years   Wife worried about progressive memory difficulty. This has been ongoing since prior to surgery. Significant trouble with short term memory. Pt notices he also has trouble remembering things. Wife notes intermittent trouble with comprehension. No noted behavior changes but remains without motivation to get out of bed. No noted hallucinations. R hand tremor progressively worsening. No alcohol intake. Continues vitamin B12 and D regularly. Notes marked improvement in situational depression with better pain control.   HTN - Compliant with current antihypertensive regimen of lasix 47mdaily, irbesartan 7535maily, metoprolol succinate 1m85mily,  spironolactone 100mg55mly. Does check blood pressures at home: good readings. No low blood pressure readings or symptoms of dizziness/syncope. Denies HA, vision changes, CP/tightness, leg swelling.    Has restarted smoking 1ppd. Notes increased cough, dyspnea, wheezing since restarting smoking.   Geriatric Assessment: Activities of Daily Living:     Bathing- dependent     Dressing- dependent     Eating- independent     Toileting- independent     Transferring- independent     Continence- independent  Overall Assessment: partially dependent  Instrumental Activities of Daily Living:     Transportation- dependent     Meal/Food Preparation- dependent     Shopping Errands- dependent     Housekeeping/Chores- dependent     Money Management/Finances- dependent     Medication Management- dependent     Ability to Use Telephone- independent     Laundry- dependent  Overall Assessment: dependent   Mental Status Exam: 20/30 (value/max value)     Clock Drawing Score: 1/4     Relevant past medical, surgical, family and social history reviewed and updated as indicated. Interim medical history since our last visit reviewed. Allergies and medications reviewed and updated. Outpatient Medications Prior to Visit  Medication Sig Dispense Refill   aspirin 81 MG tablet Take 1 tablet (81 mg total) by mouth daily.     B Complex-C (SUPER B COMPLEX PO) Take 1 tablet by mouth daily.     Ca Phosphate-Cholecalciferol [phone] MG-UNIT TABS Take 1 tablet by mouth daily.      cholecalciferol 2000 units  TABS Take 2,000 Units by mouth daily.     DULoxetine (CYMBALTA) 60 MG capsule Take 1 capsule (60 mg total) by mouth daily. 90 capsule 3   fentaNYL (DURAGESIC) 75 MCG/HR      ferrous sulfate 325 (65 FE) MG tablet Take 1 tablet (325 mg total) by mouth every other day.     fluticasone (FLONASE) 50 MCG/ACT nasal spray Place 2 sprays into both nostrils daily as needed for allergies.      folic acid (FOLVITE) 1 MG  tablet TAKE 1 TABLET BY MOUTH EVERY DAY 90 tablet 3   furosemide (LASIX) 40 MG tablet TAKE 1 TABLET BY MOUTH EVERY DAY 90 tablet 1   gabapentin (NEURONTIN) 300 MG capsule Take 1 capsule (300 mg total) by mouth 3 (three) times daily.     irbesartan (AVAPRO) 75 MG tablet TAKE 1 TABLET BY MOUTH EVERY DAY 90 tablet 3   Lactulose 20 GM/30ML SOLN Take 30 mLs (20 g total) by mouth 3 (three) times daily. 2700 mL 5   methocarbamol (ROBAXIN) 500 MG tablet TAKE 1 TABLET (500 MG TOTAL) BY MOUTH AT BEDTIME. 30 tablet 0   metoprolol succinate (TOPROL-XL) 25 MG 24 hr tablet Take 1 tablet (25 mg total) by mouth daily. 90 tablet 3   Misc Natural Products (TART CHERRY ADVANCED) CAPS Take 1 capsule by mouth 2 (two) times daily.     nitroGLYCERIN (NITROSTAT) 0.4 MG SL tablet Place 1 tablet (0.4 mg total) under the tongue every 5 (five) minutes as needed for chest pain. 25 tablet 12   nystatin cream (MYCOSTATIN) APPLY TO AFFECTED AREA TWICE A DAY 75 g 0   omeprazole (PRILOSEC) 40 MG capsule TAKE 1 CAPSULE BY MOUTH EVERY DAY 90 capsule 1   ondansetron (ZOFRAN) 4 MG tablet TAKE 1 TABLET BY MOUTH EVERY 8 HOURS AS NEEDED FOR NAUSEA AND VOMITING 30 tablet 1   Oxycodone HCl 10 MG TABS      Red Yeast Rice 600 MG CAPS Take 2 capsules by mouth daily.     spironolactone (ALDACTONE) 100 MG tablet TAKE 1 TABLET BY MOUTH EVERY DAY 90 tablet 1   vitamin B-12 (CYANOCOBALAMIN) 500 MCG tablet Take 1 tablet (500 mcg total) by mouth every Monday, Wednesday, and Friday.     albuterol (PROVENTIL HFA;VENTOLIN HFA) 108 (90 Base) MCG/ACT inhaler Inhale 2 puffs into the lungs every 6 (six) hours as needed for wheezing or shortness of breath. 1 Inhaler 0   albuterol (PROVENTIL) (2.5 MG/3ML) 0.083% nebulizer solution USE 1 VIAL PER NEBULIZER EVERY 6 HRS AS NEEDED FOR WHEEZING 75 mL 6   dicyclomine (BENTYL) 20 MG tablet TAKE 1 TABLET BY MOUTH 3 TIMES DAILY BEFORE MEALS. AS NEEDED FOR ABDOMINAL PAIN 30 tablet 1   nicotine (NICODERM CQ - DOSED IN  MG/24 HOURS) 14 mg/24hr patch PLACE 1 PATCH ONTO THE SKIN DAILY. 28 patch 0   No facility-administered medications prior to visit.     Per HPI unless specifically indicated in ROS section below Review of Systems  Objective:  BP 124/70   Pulse 60   Temp 98.2 F (36.8 C) (Temporal)   Ht 5' 9"  (1.753 m)   Wt 276 lb 4 oz (125.3 kg)   SpO2 99%   BMI 40.79 kg/m   Wt Readings from Last 3 Encounters:  12/03/20 276 lb 4 oz (125.3 kg)  08/16/20 273 lb (123.8 kg)  08/03/20 274 lb 2 oz (124.3 kg)      Physical Exam Vitals and nursing  note reviewed.  Constitutional:      Appearance: Normal appearance. He is not ill-appearing.     Comments: Ambulates with cane, slowed gait   Cardiovascular:     Rate and Rhythm: Normal rate and regular rhythm.     Pulses: Normal pulses.     Heart sounds: Normal heart sounds. No murmur heard. Pulmonary:     Effort: Pulmonary effort is normal. No respiratory distress.     Breath sounds: Normal breath sounds. No wheezing, rhonchi or rales.     Comments: Few crackles bibasilarly Musculoskeletal:     Right lower leg: No edema.     Left lower leg: No edema.  Skin:    General: Skin is warm and dry.     Findings: No rash.  Psychiatric:        Mood and Affect: Mood normal.        Behavior: Behavior normal.      Results for orders placed or performed in visit on 12/03/20  Comprehensive metabolic panel  Result Value Ref Range   Sodium 138 135 - 145 mEq/L   Potassium 4.2 3.5 - 5.1 mEq/L   Chloride 104 96 - 112 mEq/L   CO2 31 19 - 32 mEq/L   Glucose, Bld 119 (H) 70 - 99 mg/dL   BUN 15 6 - 23 mg/dL   Creatinine, Ser 0.84 0.40 - 1.50 mg/dL   Total Bilirubin 1.0 0.2 - 1.2 mg/dL   Alkaline Phosphatase 197 (H) 39 - 117 U/L   AST 23 0 - 37 U/L   ALT 11 0 - 53 U/L   Total Protein 7.0 6.0 - 8.3 g/dL   Albumin 2.7 (L) 3.5 - 5.2 g/dL   GFR 91.36 >60.00 mL/min   Calcium 8.3 (L) 8.4 - 10.5 mg/dL  CBC with Differential/Platelet  Result Value Ref Range    WBC 3.1 (L) 4.0 - 10.5 K/uL   RBC 2.93 (L) 4.22 - 5.81 Mil/uL   Hemoglobin 10.8 (L) 13.0 - 17.0 g/dL   HCT 31.1 (L) 39.0 - 52.0 %   MCV 106.0 (H) 78.0 - 100.0 fl   MCHC 34.6 30.0 - 36.0 g/dL   RDW 15.5 11.5 - 15.5 %   Platelets 43.0 (LL) 150.0 - 400.0 K/uL   Neutrophils Relative % 64.0 43.0 - 77.0 %   Lymphocytes Relative 21.1 12.0 - 46.0 %   Monocytes Relative 10.6 3.0 - 12.0 %   Eosinophils Relative 3.6 0.0 - 5.0 %   Basophils Relative 0.7 0.0 - 3.0 %   Neutro Abs 2.0 1.4 - 7.7 K/uL   Lymphs Abs 0.7 0.7 - 4.0 K/uL   Monocytes Absolute 0.3 0.1 - 1.0 K/uL   Eosinophils Absolute 0.1 0.0 - 0.7 K/uL   Basophils Absolute 0.0 0.0 - 0.1 K/uL  Protime-INR  Result Value Ref Range   INR 1.3 (H) 0.8 - 1.0 ratio   Prothrombin Time 14.2 (H) 9.6 - 13.1 sec  Prealbumin  Result Value Ref Range   Prealbumin 7 (L) 21 - 43 mg/dL   Lab Results  Component Value Date   VITAMINB12 926 (H) 07/30/2020    Assessment & Plan:  This visit occurred during the SARS-CoV-2 public health emergency.  Safety protocols were in place, including screening questions prior to the visit, additional usage of staff PPE, and extensive cleaning of exam room while observing appropriate contact time as indicated for disinfecting solutions.   Problem List Items Addressed This Visit     Smoker  Has restarted ssmoking since surgery. Reviewed negative health consequences of ongoing smoking, encouraged full cessation.        COPD (chronic obstructive pulmonary disease) (HCC)    Notes worsening respiratory status since restarting smoking. Encouraged full cessation. Lungs clear today. Refill albuterol inh/neb. Will send nebulizer machine Rx.        Relevant Medications   albuterol (PROVENTIL) (2.5 MG/3ML) 0.083% nebulizer solution   albuterol (VENTOLIN HFA) 108 (90 Base) MCG/ACT inhaler   Chronic pain syndrome    Continues seeing pain clinic in [place] (Dr Sanjuan Dame)        Relevant Medications   gabapentin  (NEURONTIN) 300 MG capsule   Osteoarthritis of right hip    Sp R hip replacement 09/2020       Cirrhosis of liver with ascites ([place])    H/o NASH cirrhosis followed by [facility] (Dr Vicente Males). Appreciate GI care.  Next liver imaging planned for 01/2021.  Intermittently on lactulose.  Overdue for GI f/u - I will ask him to call and schedule.  Fib-4 score = 10.48 -> known scarred liver by biopsy 03/2018.        Relevant Orders   Protime-INR (Completed)   Thrombocytopenia ([place])    Cirrhosis related - update CBC.  Did need Avatrombopag for hip replacement surgery.        Protein-calorie malnutrition ([place])    Reviewed how this could contribute to memory difficulties, encouraged good nutrition status.  Update albumin and prealbumin.        Relevant Orders   Prealbumin (Completed)   MDD (major depressive disorder), recurrent severe, without psychosis ([place])    Improvement after recent hip replacement surgery with marked improvement in pain.        Pancytopenia ([place])    Update labs.        Memory deficit - Primary    Concern for developing dementia over the past few years more pervasive than would be expected for pseudodementia. MMSE today 20/30, CDT 1/4, dependent in IADLs.  Further evaluate with labwork today.  Reviewed latest head imaging earlier this year - age related volume loss with noted ATH. Continue aspirin, RYR (unable to tolerate statin). Reviewed healthy lifestyle recommendations to support healthy brain - see pt instructions. rec fully quit smoking. No signs of LBD, PD, no h/o stroke. ?early Alz. Consider aricept/namenda vs neurology eval. Avoid vit E given theoretical increased bleeding risk in known thrombocytopenia.  Consider hepatic encephalopathy as cause.  Reassess at f/u visit.        Relevant Orders   Comprehensive metabolic panel (Completed)   CBC with Differential/Platelet (Completed)   Statin intolerance   Status post right hip replacement     Meds  ordered this encounter  Medications   albuterol (PROVENTIL) (2.5 MG/3ML) 0.083% nebulizer solution    Sig: USE 1 VIAL PER NEBULIZER EVERY 6 HRS AS NEEDED FOR WHEEZING    Dispense:  75 mL    Refill:  6   albuterol (VENTOLIN HFA) 108 (90 Base) MCG/ACT inhaler    Sig: Inhale 2 puffs into the lungs every 6 (six) hours as needed for wheezing or shortness of breath.    Dispense:  1 each    Refill:  3   Respiratory Therapy Supplies (NEBULIZER) DEVI    Sig: Use as instructed for albuterol nebulization, with necessary tubing    Dispense:  1 each    Refill:  0   Orders Placed This Encounter  Procedures   Comprehensive metabolic panel  CBC with Differential/Platelet   Protime-INR   Prealbumin    Patient Instructions  Labs today Ensure healthy lifestyle to support a healthy mind:  1. Nutritious well balance diet.  2. Regular physical activity routine.  3. Regular mental activity such as reading books, word puzzles, math puzzles, jigsaw puzzles.  4. Social engagement.  Also ensure good blood pressure control, limit alcohol, no smoking.    Work towards fully quitting smoking again.  Continue current medicines.  Return in 3-4 months for follow up visit.   Follow up plan: Return in about 3 months (around 03/05/2021), or if symptoms worsen or fail to improve, for follow up visit.  Ria Bush, MD

## 2020-12-03 NOTE — Telephone Encounter (Signed)
Noted. Chronic.

## 2020-12-03 NOTE — Telephone Encounter (Signed)
[facility] called a critical platelet count, 43,000. Results given to Dr Danise Mina

## 2020-12-03 NOTE — Patient Instructions (Addendum)
Labs today Ensure healthy lifestyle to support a healthy mind:  1. Nutritious well balance diet.  2. Regular physical activity routine.  3. Regular mental activity such as reading books, word puzzles, math puzzles, jigsaw puzzles.  4. Social engagement.  Also ensure good blood pressure control, limit alcohol, no smoking.    Work towards fully quitting smoking again.  Continue current medicines.  Return in 3-4 months for follow up visit.

## 2020-12-04 DIAGNOSIS — Z96641 Presence of right artificial hip joint: Secondary | ICD-10-CM | POA: Insufficient documentation

## 2020-12-04 LAB — PREALBUMIN: Prealbumin: 7 mg/dL — ABNORMAL LOW (ref 21–43)

## 2020-12-04 MED ORDER — NEBULIZER DEVI: 0 refills | Status: DC

## 2020-12-04 NOTE — Assessment & Plan Note (Signed)
Continues seeing pain clinic in [place] (Dr Sanjuan Dame)

## 2020-12-04 NOTE — Assessment & Plan Note (Signed)
Cirrhosis related - update CBC.  Did need Avatrombopag for hip replacement surgery.

## 2020-12-04 NOTE — Assessment & Plan Note (Addendum)
Reviewed how this could contribute to memory difficulties, encouraged good nutrition status.  Update albumin and prealbumin.

## 2020-12-04 NOTE — Assessment & Plan Note (Addendum)
Concern for developing dementia over the past few years more pervasive than would be expected for pseudodementia. MMSE today 20/30, CDT 1/4, dependent in IADLs.  Further evaluate with labwork today.  Reviewed latest head imaging earlier this year - age related volume loss with noted ATH. Continue aspirin, RYR (unable to tolerate statin). Reviewed healthy lifestyle recommendations to support healthy brain - see pt instructions. rec fully quit smoking. No signs of LBD, PD, no h/o stroke. ?early Alz. Consider aricept/namenda vs neurology eval. Avoid vit E given theoretical increased bleeding risk in known thrombocytopenia.  Consider hepatic encephalopathy as cause.  Reassess at f/u visit.

## 2020-12-04 NOTE — Assessment & Plan Note (Signed)
Update labs.  

## 2020-12-04 NOTE — Assessment & Plan Note (Signed)
Has restarted ssmoking since surgery. Reviewed negative health consequences of ongoing smoking, encouraged full cessation.

## 2020-12-04 NOTE — Assessment & Plan Note (Signed)
Improvement after recent hip replacement surgery with marked improvement in pain.

## 2020-12-04 NOTE — Assessment & Plan Note (Signed)
Notes worsening respiratory status since restarting smoking. Encouraged full cessation. Lungs clear today. Refill albuterol inh/neb. Will send nebulizer machine Rx.

## 2020-12-04 NOTE — Assessment & Plan Note (Addendum)
H/o NASH cirrhosis followed by  GI (Dr Vicente Males). Appreciate GI care.  Next liver imaging planned for 01/2021.  Intermittently on lactulose.  Overdue for GI f/u - I will ask him to call and schedule.  Fib-4 score = 10.48 -> known scarred liver by biopsy 03/2018.

## 2020-12-04 NOTE — Assessment & Plan Note (Signed)
Sp R hip replacement 09/2020

## 2020-12-07 ENCOUNTER — Telehealth: Payer: Self-pay | Admitting: Gastroenterology

## 2020-12-07 NOTE — Telephone Encounter (Signed)
Sent to wrong nurse.... sorry

## 2020-12-07 NOTE — Telephone Encounter (Signed)
methocarbamol (ROBAXIN) 500 MG tablet too expensive $825 a month. Can she take something else? Please advise

## 2020-12-08 NOTE — Telephone Encounter (Signed)
Dr. Vicente Males did not prescribe this medication. Sending to PCP

## 2020-12-09 ENCOUNTER — Ambulatory Visit: Payer: Self-pay | Admitting: Urology

## 2020-12-13 MED ORDER — METHOCARBAMOL 500 MG PO TABS: 500.0000 mg | ORAL_TABLET | Freq: Every day | ORAL | 3 refills | Status: DC

## 2020-12-13 NOTE — Telephone Encounter (Signed)
Called [facility] and they said Rx is only $1, they have never heard of med being that expensive ($800+). Pt did call GI's office so maybe he told them the wrong medication.  Called pt and no answer so left VM requesting pt to call the office back

## 2020-12-13 NOTE — Telephone Encounter (Addendum)
Robaxin should be covered by insurance - please verify this is med that is costing >$800/mo   This would be only muscle relaxant I'd recommend besides baclofen given liver disease. And baclofen previously tried caused nausea/vomiting.  If can't afford robaxin, would suggest he take tylenol 557m one tablet at night to help with sleep.

## 2020-12-14 ENCOUNTER — Other Ambulatory Visit: Payer: Self-pay | Admitting: Family Medicine

## 2020-12-15 ENCOUNTER — Telehealth: Payer: Self-pay | Admitting: Family Medicine

## 2020-12-15 ENCOUNTER — Ambulatory Visit: Payer: Medicare HMO | Admitting: Urology

## 2020-12-15 ENCOUNTER — Encounter: Payer: Self-pay | Admitting: Urology

## 2020-12-15 ENCOUNTER — Other Ambulatory Visit: Payer: Self-pay

## 2020-12-15 VITALS — BP 154/85 | HR 91 | Ht 69.0 in | Wt 285.0 lb

## 2020-12-15 DIAGNOSIS — N3281 Overactive bladder: Secondary | ICD-10-CM | POA: Diagnosis not present

## 2020-12-15 NOTE — Telephone Encounter (Signed)
Shala from [facility] called in about patient's orders would like a call back #[phone] [place]

## 2020-12-15 NOTE — Progress Notes (Signed)
   12/15/2020 11:52 AM   [name] 07/23/1955 [id number]  Reason for visit: Follow up urinary symptoms, scrotal pain  HPI:  He is an extremely comorbid 65 year old male with morbid obesity, AAA repair, cirrhosis, coronary artery disease, chronic pain on narcotics, and hidradenitis.   He also takes 40 mg Lasix daily, and continues to drink at least 2 L a day of soda(7-Up).  He has a history of a negative hematuria work-up in 2018 with Dr. Pilar Jarvis, and cystoscopy showed a small friable prostate, and prostate measured 20 g on CT. he underwent a hip surgery recently and is ambulatory after many years being wheelchair-bound.  Urinalysis last year was completely benign.  His scrotal pain has improved significantly since being out of the wheelchair.  He is on chronic narcotics with oxycodone, as well as taking gabapentin for chronic pain.  He continues to have some urgency, frequency, urge incontinence, nocturia.  We discussed at length this is likely secondary to his high soda intake, Lasix, poor mobility, and obesity.  I am very hesitant to start any bladder medications on him with his frailty and comorbidities, and very high soda intake.  I recommended starting with behavioral strategies prior to considering any medications.  Could consider Myrbetriq in the future if persistent OAB symptoms despite making above lifestyle changes.  RTC 1 year symptom check, PVR   Billey Co, MD  [facility] [address] [place], [place] [address] [phone]

## 2020-12-15 NOTE — Patient Instructions (Signed)
Decrease or remove soda from the diet, as this is likely the main reason you have urinary frequency, urgency, and urinary leakage.  We would like to avoid any bladder medications, as these can have significant side effects including constipation, confusion, and increased fall risk.  Overactive Bladder, Adult  Overactive bladder is a condition in which a person has a sudden and frequent need to urinate. A person might also leak urine if he or she cannot get to the bathroom fast enough (urinary incontinence). Sometimes, symptoms can interfere with work or social activities. What are the causes? Overactive bladder is associated with poor nerve signals between your bladder and your brain. Your bladder may get the signal to empty before it is full. You may also have very sensitive muscles that make your bladder squeeze too soon. This condition may also be caused by other factors, such as: Medical conditions: Urinary tract infection. Infection of nearby tissues. Prostate enlargement. Bladder stones, inflammation, or tumors. Diabetes. Muscle or nerve weakness, especially from these conditions: A spinal cord injury. Stroke. Multiple sclerosis. Parkinson's disease. Other causes: Surgery on the uterus or urethra. Drinking too much caffeine or alcohol. Certain medicines, especially those that eliminate extra fluid in the body (diuretics). Constipation. What increases the risk? You may be at greater risk for overactive bladder if you: Are an older adult. Smoke. Are going through menopause. Have prostate problems. Have a neurological disease, such as stroke, dementia, Parkinson's disease, or multiple sclerosis (MS). Eat or drink alcohol, spicy food, caffeine, and other things that irritate the bladder. Are overweight or obese. What are the signs or symptoms? Symptoms of this condition include a sudden, strong urge to urinate. Other symptoms include: Leaking urine. Urinating 8 or more times a  day. Waking up to urinate 2 or more times overnight. How is this diagnosed? This condition may be diagnosed based on: Your symptoms and medical history. A physical exam. Blood or urine tests to check for possible causes, such as infection. You may also need to see a health care provider who specializes in urinarytract problems. This is called a urologist. How is this treated? Treatment for overactive bladder depends on the cause of your condition and whether it is mild or severe. Treatment may include: Bladder training, such as: Learning to control the urge to urinate by following a schedule to urinate at regular intervals. Doing Kegel exercises to strengthen the pelvic floor muscles that support your bladder. Special devices, such as: Biofeedback. This uses sensors to help you become aware of your body's signals. Electrical stimulation. This uses electrodes placed inside the body (implanted) or outside the body. These electrodes send gentle pulses of electricity to strengthen the nerves or muscles that control the bladder. Women may use a plastic device, called a pessary, that fits into the vagina and supports the bladder. Medicines, such as: Antibiotics to treat bladder infection. Antispasmodics to stop the bladder from releasing urine at the wrong time. Tricyclic antidepressants to relax bladder muscles. Injections of botulinum toxin type A directly into the bladder tissue to relax bladder muscles. Surgery, such as: A device may be implanted to help manage the nerve signals that control urination. An electrode may be implanted to stimulate electrical signals in the bladder. A procedure may be done to change the shape of the bladder. This is done only in very severe cases. Follow these instructions at home: Eating and drinking  Make diet or lifestyle changes recommended by your health care provider. These may include: Drinking fluids throughout  the day and not only with  meals. Cutting down on caffeine or alcohol. Eating a healthy and balanced diet to prevent constipation. This may include: Choosing foods that are high in fiber, such as beans, whole grains, and fresh fruits and vegetables. Limiting foods that are high in fat and processed sugars, such as fried and sweet foods.  Lifestyle  Lose weight if needed. Do not use any products that contain nicotine or tobacco. These include cigarettes, chewing tobacco, and vaping devices, such as e-cigarettes. If you need help quitting, ask your health care provider.  General instructions Take over-the-counter and prescription medicines only as told by your health care provider. If you were prescribed an antibiotic medicine, take it as told by your health care provider. Do not stop taking the antibiotic even if you start to feel better. Use any implants or pessary as told by your health care provider. If needed, wear pads to absorb urine leakage. Keep a log to track how much and when you drink, and when you need to urinate. This will help your health care provider monitor your condition. Keep all follow-up visits. This is important. Contact a health care provider if: You have a fever or chills. Your symptoms do not get better with treatment. Your pain and discomfort get worse. You have more frequent urges to urinate. Get help right away if: You are not able to control your bladder. Summary Overactive bladder refers to a condition in which a person has a sudden and frequent need to urinate. Several conditions may lead to an overactive bladder. Treatment for overactive bladder depends on the cause and severity of your condition. Making lifestyle changes, doing Kegel exercises, keeping a log, and taking medicines can help with this condition. This information is not intended to replace advice given to you by your health care provider. Make sure you discuss any questions you have with your healthcare  provider. Document Revised: 02/09/2020 Document Reviewed: 02/09/2020 Elsevier Patient Education  [place].

## 2020-12-15 NOTE — Telephone Encounter (Signed)
Patient called and informed me that they already picked up the robaxin. Patient already taking medication.

## 2020-12-20 ENCOUNTER — Telehealth: Payer: Self-pay

## 2020-12-20 NOTE — Chronic Care Management (AMB) (Addendum)
Chronic Care Management Pharmacy Assistant   Name: GORJE IYER  MRN: [id number] DOB: 09-May-1956  NYLEN CREQUE is an 65 y.o. year old male who presents for his initial CCM visit with the clinical pharmacist.  Reason for Encounter: Initial Questions   Conditions to be addressed/monitored: CAD, HTN, HLD, and COPD  Recent office visits:  12/03/20 - Dr.Gutierrez PCP - Presented for memory concerns. Recommend health lifestyle. Follow up 4 months. 08/03/20 - Dr.Gutierrez PCP - Presented for AWV. Increase toprol XL to 71m daily (currenty at 12.552mdaily).  Recent consult visits:  12/15/20 - Urology - Recommend lifestyle management of urinary symptoms 09/23/20 - Orthopedics - Transition to aspirin from lovenox injections  08/16/20 - Gastroenterology - Start Xifaxan for hepatic encephalopathy  [facility] visits:  09/09/20 thru 09/20/20 [facility]  [facility] - [facility], SOB   Medications: Outpatient Encounter Medications as of 12/20/2020  Medication Sig   albuterol (PROVENTIL) (2.5 MG/3ML) 0.083% nebulizer solution USE 1 VIAL PER NEBULIZER EVERY 6 HRS AS NEEDED FOR WHEEZING   albuterol (VENTOLIN HFA) 108 (90 Base) MCG/ACT inhaler Inhale 2 puffs into the lungs every 6 (six) hours as needed for wheezing or shortness of breath.   aspirin 81 MG tablet Take 1 tablet (81 mg total) by mouth daily.   B Complex-C (SUPER B COMPLEX PO) Take 1 tablet by mouth daily.   Ca Phosphate-Cholecalciferol [phone] MG-UNIT TABS Take 1 tablet by mouth daily.    cholecalciferol 2000 units TABS Take 2,000 Units by mouth daily.   DULoxetine (CYMBALTA) 60 MG capsule Take 1 capsule (60 mg total) by mouth daily.   fentaNYL (DURAGESIC) 75 MCG/HR    ferrous sulfate 325 (65 FE) MG tablet Take 1 tablet (325 mg total) by mouth every other day.   fluticasone (FLONASE) 50 MCG/ACT nasal spray Place 2 sprays into both nostrils daily as needed for allergies.     folic acid (FOLVITE) 1 MG tablet TAKE 1 TABLET BY MOUTH EVERY DAY   furosemide (LASIX) 40 MG tablet TAKE 1 TABLET BY MOUTH EVERY DAY   gabapentin (NEURONTIN) 300 MG capsule Take 1 capsule (300 mg total) by mouth 3 (three) times daily.   irbesartan (AVAPRO) 75 MG tablet TAKE 1 TABLET BY MOUTH EVERY DAY   Lactulose 20 GM/30ML SOLN Take 30 mLs (20 g total) by mouth 3 (three) times daily.   methocarbamol (ROBAXIN) 500 MG tablet Take 1 tablet (500 mg total) by mouth at bedtime.   metoprolol succinate (TOPROL-XL) 25 MG 24 hr tablet Take 1 tablet (25 mg total) by mouth daily.   Misc Natural Products (TART CHERRY ADVANCED) CAPS Take 1 capsule by mouth 2 (two) times daily.   nitroGLYCERIN (NITROSTAT) 0.4 MG SL tablet Place 1 tablet (0.4 mg total) under the tongue every 5 (five) minutes as needed for chest pain.   nystatin cream (MYCOSTATIN) APPLY TO AFFECTED AREA TWICE A DAY   omeprazole (PRILOSEC) 40 MG capsule TAKE 1 CAPSULE BY MOUTH EVERY DAY   ondansetron (ZOFRAN) 4 MG tablet TAKE 1 TABLET BY MOUTH EVERY 8 HOURS AS NEEDED FOR NAUSEA AND VOMITING   Oxycodone HCl 10 MG TABS    Red Yeast Rice 600 MG CAPS Take 2 capsules by mouth daily.   Respiratory Therapy Supplies (NEBULIZER) DEVI Use as instructed for albuterol nebulization, with necessary tubing   spironolactone (ALDACTONE) 100 MG tablet TAKE 1 TABLET BY MOUTH EVERY DAY   vitamin  B-12 (CYANOCOBALAMIN) 500 MCG tablet Take 1 tablet (500 mcg total) by mouth every Monday, Wednesday, and Friday.   No facility-administered encounter medications on file as of 12/20/2020.    Lab Results  Component Value Date/Time   HGBA1C 4.9 07/30/2020 12:18 PM   HGBA1C 5.1 01/09/2020 03:04 PM   HGBA1C 4.8 12/10/2018 12:00 AM   MICROALBUR 1.8 05/06/2012 09:12 AM   MICROALBUR 2.3 (H) 02/14/2011 09:42 AM     BP Readings from Last 3 Encounters:  12/15/20 (!) 154/85  12/03/20 124/70  08/16/20 122/79    Patient contacted to review initial questions prior to visit  with Debbora Dus.  Have you seen any other providers since your last visit with PCP? No  Any changes in your medications or health? No  Any side effects from any medications? No  Do you have an symptoms or problems not managed by your medications? No  Any concerns about your health right now? No  Has your provider asked that you check blood pressure, blood sugar, or follow special diet at home? No, but the patient does have a home BP monitor  Do you get any type of exercise on a regular basis? No  Can you think of a goal you would like to reach for your health? No  Do you have any problems getting your medications? No uses [facility]   Is there anything that you would like to discuss during the appointment? No   JULEON NARANG was reminded to have all medications, supplements and any blood glucose and blood pressure readings available for review with Debbora Dus, Pharm. D, at his telephone visit on 12/27/2020 at 11:00am. The wife will be present as well   Star Rating Drugs:  Medication:  Last Fill: Day Supply Irbesartan 62m 10/07/20  90  Care Gaps: Last annual wellness visit: 08/03/20  MDebbora Dus CPP notified  VAvel Sensor CRavensworthAssistant [phone] I have reviewed the care management and care coordination activities outlined in this encounter and I am certifying that I agree with the content of this note. No further action required.  MDebbora Dus PharmD Clinical Pharmacist [phone]

## 2020-12-21 NOTE — Telephone Encounter (Signed)
Tried Administrator from [facility], she did not answer LVM for her to call back.

## 2020-12-23 DIAGNOSIS — G600 Hereditary motor and sensory neuropathy: Secondary | ICD-10-CM | POA: Diagnosis not present

## 2020-12-27 ENCOUNTER — Other Ambulatory Visit: Payer: Self-pay

## 2020-12-27 ENCOUNTER — Ambulatory Visit (INDEPENDENT_AMBULATORY_CARE_PROVIDER_SITE_OTHER): Payer: Medicare HMO

## 2020-12-27 DIAGNOSIS — I10 Essential (primary) hypertension: Secondary | ICD-10-CM | POA: Diagnosis not present

## 2020-12-27 DIAGNOSIS — E785 Hyperlipidemia, unspecified: Secondary | ICD-10-CM | POA: Diagnosis not present
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
## 2020-12-31 ENCOUNTER — Telehealth: Payer: Self-pay

## 2020-12-31 NOTE — Patient Instructions (Signed)
Dear [name],  It was a pleasure meeting you during our initial appointment on December 27, 2020. Below is a summary of the goals we discussed and components of chronic care management. Please contact me anytime with questions or concerns.   Visit Information  Patient Care Plan: CCM Pharmacy Care Plan     Problem Identified: Disease Management   Priority: High  Onset Date: 12/31/2020  Note:   Current Barriers:  Patient does not adhere to prescribed regimen or dietary changes per wife's report  Pharmacist Clinical Goal(s):  Patient will contact provider office for questions/concerns as evidenced notation of same in electronic health record through collaboration with PharmD and provider.   Interventions: 1:1 collaboration with Ria Bush, MD regarding development and update of comprehensive plan of care as evidenced by provider attestation and co-signature Inter-disciplinary care team collaboration (see longitudinal plan of care) Comprehensive medication review performed; medication list updated in electronic medical record  Hypertension (BP goal <130/80) -Controlled - per clinic readings -Comorbid cirrhosis of the liver with ascites, followed by GI -Current treatment: Metoprolol succinate 25 mg - 1 tablet daily Irbesartan 75 mg - 1 tablet daily Spironolactone 100 mg - 1 tablet daily  Furosemide 40 mg - 1 tablet daily -Medications previously tried: none  -Current home readings: checks BP on occasion, declined to report today -Denies hypotensive/hypertensive symptoms -Recommended to continue current medication  Hyperlipidemia: (LDL goal < 70) -Uncontrolled - LDL 88 -Current treatment: Red Yeast Rice 600 mg - 2 capsule daily -Medications previously tried: atorvastatin  -Current dietary patterns: wife reports very poor diet -Current exercise habits: minimal -Recommended to continue current medication   Patient Goals/Self-Care Activities Patient will:  - take  medications as prescribed - alert CCM pharmacist with any medication concerns  Follow Up Plan: Telephone follow up appointment with care management team member scheduled for:  3-6 months, will have CMA contact to see if patient has any medication needs and if willing to schedule f/u appt at that time      Mr. Whitefield was given information about Chronic Care Management services today including:  CCM service includes personalized support from designated clinical staff supervised by his physician, including individualized plan of care and coordination with other care providers 24/7 contact phone numbers for assistance for urgent and routine care needs. Standard insurance, coinsurance, copays and deductibles apply for chronic care management only during months in which we provide at least 20 minutes of these services. Most insurances cover these services at 100%, however patients may be responsible for any copay, coinsurance and/or deductible if applicable. This service may help you avoid the need for more expensive face-to-face services. Only one practitioner may furnish and bill the service in a calendar month. The patient may stop CCM services at any time (effective at the end of the month) by phone call to the office staff.  Patient agreed to services and verbal consent obtained.   Patient verbalizes understanding of instructions provided today and agrees to view in [place].   Debbora Dus, PharmD Clinical Pharmacist [facility] at [facility] [phone]

## 2020-12-31 NOTE — Telephone Encounter (Signed)
Hello,   Patient is requesting a refill on lactulose and [facility]fran [facility] [place]. Also, he is not taking the Xifaxan due to cost. I see there was prior mention of patient assistance (08/17/20). Could someone reach out to him about assistance options?  Debbora Dus, PharmD Clinical Pharmacist [facility] at [facility]  [phone]

## 2021-01-04 NOTE — Telephone Encounter (Signed)
Tried Calling [name] again to figure out what was going on with [name]. I had called previously and left a message stating that she needed to call back for orders and she never called. I left another VM for her to call me back.

## 2021-01-06 ENCOUNTER — Other Ambulatory Visit (INDEPENDENT_AMBULATORY_CARE_PROVIDER_SITE_OTHER): Payer: Medicare HMO

## 2021-01-06 ENCOUNTER — Ambulatory Visit (INDEPENDENT_AMBULATORY_CARE_PROVIDER_SITE_OTHER): Payer: Medicare HMO | Admitting: Vascular Surgery

## 2021-01-07 ENCOUNTER — Encounter (INDEPENDENT_AMBULATORY_CARE_PROVIDER_SITE_OTHER): Payer: Self-pay | Admitting: Vascular Surgery

## 2021-01-11 ENCOUNTER — Telehealth: Payer: Self-pay | Admitting: Gastroenterology

## 2021-01-11 NOTE — Telephone Encounter (Signed)
Mendel Ryder w/ [facility] is calling about some medication problems with this patient, please call her at # [phone].

## 2021-01-11 NOTE — Telephone Encounter (Signed)
Faxed orders to [facility]  for patient.

## 2021-01-11 NOTE — Progress Notes (Addendum)
Dr. Georgeann Oppenheim office returned call. Receptionist is going to request refills of lactulose and Zofran. She is also going to ask about patients Xifaxan. Medication has been "ended" in patients chart. Asked if Dr. Vicente Males would like [name] to take Xifaxan and if so would they be willing to complete patient assistance. If not let us know and we would be willing to completed on behalf of patient. She explained Dr. Vicente Males was out of office this week but she would send over the message to him and let me know as soon as she hears.    [name], CPP notified  [name], [name] Pharmacy Assistant [phone]

## 2021-01-12 MED ORDER — ONDANSETRON HCL 4 MG PO TABS: ORAL_TABLET | ORAL | 1 refills | Status: DC

## 2021-01-12 MED ORDER — RIFAXIMIN 550 MG PO TABS: 550.0000 mg | ORAL_TABLET | Freq: Two times a day (BID) | ORAL | 0 refills | Status: DC

## 2021-01-12 MED ORDER — LACTULOSE 20 GM/30ML PO SOLN: 20.0000 g | Freq: Three times a day (TID) | ORAL | 5 refills | Status: DC

## 2021-01-12 NOTE — Telephone Encounter (Signed)
Called Mendel Ryder back and she stated that the patient needed a refill on his Lactulose and Zofran. She also wanted a refill on his Zifaxan if he still needed. Can you please advise.

## 2021-01-13 DIAGNOSIS — G894 Chronic pain syndrome: Secondary | ICD-10-CM | POA: Diagnosis not present

## 2021-01-13 DIAGNOSIS — M542 Cervicalgia: Secondary | ICD-10-CM | POA: Diagnosis not present

## 2021-01-13 DIAGNOSIS — M25511 Pain in right shoulder: Secondary | ICD-10-CM | POA: Diagnosis not present

## 2021-01-13 DIAGNOSIS — Z79891 Long term (current) use of opiate analgesic: Secondary | ICD-10-CM | POA: Diagnosis not present

## 2021-01-13 DIAGNOSIS — G600 Hereditary motor and sensory neuropathy: Secondary | ICD-10-CM | POA: Diagnosis not present

## 2021-01-13 DIAGNOSIS — M25551 Pain in right hip: Secondary | ICD-10-CM | POA: Diagnosis not present

## 2021-01-23 DIAGNOSIS — G600 Hereditary motor and sensory neuropathy: Secondary | ICD-10-CM | POA: Diagnosis not present

## 2021-01-27 ENCOUNTER — Other Ambulatory Visit: Payer: Self-pay | Admitting: Family Medicine

## 2021-01-27 DIAGNOSIS — Z96641 Presence of right artificial hip joint: Secondary | ICD-10-CM | POA: Diagnosis not present

## 2021-01-27 DIAGNOSIS — Z471 Aftercare following joint replacement surgery: Secondary | ICD-10-CM | POA: Diagnosis not present

## 2021-01-27 DIAGNOSIS — M1612 Unilateral primary osteoarthritis, left hip: Secondary | ICD-10-CM | POA: Diagnosis not present

## 2021-01-27 DIAGNOSIS — M461 Sacroiliitis, not elsewhere classified: Secondary | ICD-10-CM | POA: Diagnosis not present

## 2021-02-01 ENCOUNTER — Telehealth (INDEPENDENT_AMBULATORY_CARE_PROVIDER_SITE_OTHER): Payer: Medicare HMO | Admitting: Gastroenterology

## 2021-02-01 DIAGNOSIS — K7682 Hepatic encephalopathy: Secondary | ICD-10-CM

## 2021-02-01 DIAGNOSIS — K7460 Unspecified cirrhosis of liver: Secondary | ICD-10-CM | POA: Diagnosis not present

## 2021-02-01 DIAGNOSIS — R188 Other ascites: Secondary | ICD-10-CM

## 2021-02-01 DIAGNOSIS — K7290 Hepatic failure, unspecified without coma: Secondary | ICD-10-CM | POA: Diagnosis not present

## 2021-02-01 MED ORDER — SPIRONOLACTONE 100 MG PO TABS: 100.0000 mg | ORAL_TABLET | Freq: Every day | ORAL | 1 refills | Status: DC

## 2021-02-01 MED ORDER — LACTULOSE 20 GM/30ML PO SOLN: 20.0000 g | Freq: Three times a day (TID) | ORAL | 5 refills | Status: DC

## 2021-02-01 MED ORDER — RIFAXIMIN 550 MG PO TABS: 550.0000 mg | ORAL_TABLET | Freq: Two times a day (BID) | ORAL | 0 refills | Status: DC

## 2021-02-01 NOTE — Progress Notes (Signed)
[name] , MD [address]  [place]  [place], [place] [address]  Main: [phone]  Fax: [phone]   Primary Care Physician: Ria Bush, MD  Virtual Visit via Video Note  I connected with patient on 02/01/21 at  2:45 PM EDT by video and verified that I am speaking with the correct person using two identifiers.   I discussed the limitations, risks, security and privacy concerns of performing an evaluation and management service by video  and the availability of in person appointments. I also discussed with the patient that there may be a patient responsible charge related to this service. The patient expressed understanding and agreed to proceed.  Location of Patient: Home Location of Provider: Home Persons involved: Patient and provider only   History of Present Illness:   C/c Follow up liver cirrhosis    HPI: [name] is a 65 y.o. male   Summary of history :   He was initially referred and seen on 02/13/18 for cirrhosis of the liver . He has been seen by [facility] in the past.. His Cirrhosis had been attriuted to NASH. Used cocaine in his 20's, none recently. Weighed 370 lbs at his heaviest. Was always " big", he is not diabetic.     CT angio of the abdomen 12/2017:Post endovascular repair of suprarenal abdominal aortic aneurysm without evidence of complication .Suspected hemodynamically significant stenosis involving the origin of the common hepatic artery, similar to the 12/2015 examination.Potential hemodynamically significant narrowing involving theproximal aspect of the right SFA.Findings of hepatic cirrhosis and portal venous hypertension with several varices about the gastroesophageal junction, splenomegaly and trace amount of intra-abdominal ascites. No discrete hyperenhancing hepatic lesions  02/13/18 : Cr 1.03,Hep A ab positive , albumin 3.0 ,alk phos 149 , Hbsab/ag,HCV ab , ANA,negative .Ceruloplasmin ,alpha 1 AT,Iron studies - normal   03/19/18 : liver biopsy : steatosis , focal plasma cells, mild chronic cholestasis . Unmable to get a diagnosis due to burned out liver disease.   05/15/2019: MRI liver cirrhotic liver, dilated CBD, no obstruction . Splenomegaly and trace ascites 05/12/2019: EGD: Normal    Smooth muscle antibody positive , elevated immunoglobulins G, AMA,LKM ab  negative Hospital admission in 03/26/2019 for ascites, underwent paracentesis.   07/14/2020 CT scan of the chest abdomen pelvis with contrast shows findings consistent with cirrhosis and portal hypertension stable common bile duct dilation since 2020      Interval history 08/16/2020-02/01/2021    MELD-Na score: 9 at 12/03/2020 11:56 AM MELD score: 9 at 12/03/2020 11:56 AM Calculated from: Serum Creatinine: 0.84 mg/dL (Using min of 1 mg/dL) at 12/03/2020 11:56 AM Serum Sodium: 138 mEq/L (Using max of 137 mEq/L) at 12/03/2020 11:56 AM Total Bilirubin: 1.0 mg/dL at 12/03/2020 11:56 AM INR(ratio): 1.3 ratio at 12/03/2020 11:56 AM Age: 73 years   Per wife required refills of his meds, has been having some abdominal discomfort , I explained would need office visit to examine him. On a low salt diet .      Current Outpatient Medications  Medication Sig Dispense Refill   albuterol (PROVENTIL) (2.5 MG/3ML) 0.083% nebulizer solution USE 1 VIAL PER NEBULIZER EVERY 6 HRS AS NEEDED FOR WHEEZING 75 mL 6   albuterol (VENTOLIN HFA) 108 (90 Base) MCG/ACT inhaler Inhale 2 puffs into the lungs every 6 (six) hours as needed for wheezing or shortness of breath. 1 each 3   aspirin 81 MG tablet Take 1 tablet (81 mg total) by mouth daily.  B Complex-C (SUPER B COMPLEX PO) Take 1 tablet by mouth daily.     Ca Phosphate-Cholecalciferol [phone] MG-UNIT TABS Take 1 tablet by mouth daily.      cholecalciferol 2000 units TABS Take 2,000 Units by mouth daily.     DULoxetine (CYMBALTA) 60 MG capsule Take 1 capsule (60 mg total) by mouth daily. 90 capsule 3   fentaNYL (DURAGESIC) 75  MCG/HR      ferrous sulfate 325 (65 FE) MG tablet Take 1 tablet (325 mg total) by mouth every other day.     fluticasone (FLONASE) 50 MCG/ACT nasal spray Place 2 sprays into both nostrils daily as needed for allergies.      folic acid (FOLVITE) 1 MG tablet TAKE 1 TABLET BY MOUTH EVERY DAY 90 tablet 0   furosemide (LASIX) 40 MG tablet TAKE 1 TABLET BY MOUTH EVERY DAY 90 tablet 1   gabapentin (NEURONTIN) 300 MG capsule Take 1 capsule (300 mg total) by mouth 3 (three) times daily.     irbesartan (AVAPRO) 75 MG tablet TAKE 1 TABLET BY MOUTH EVERY DAY 90 tablet 3   Lactulose 20 GM/30ML SOLN Take 30 mLs (20 g total) by mouth 3 (three) times daily. 2700 mL 5   methocarbamol (ROBAXIN) 500 MG tablet Take 1 tablet (500 mg total) by mouth at bedtime. 30 tablet 3   metoprolol succinate (TOPROL-XL) 25 MG 24 hr tablet Take 1 tablet (25 mg total) by mouth daily. 90 tablet 3   Misc Natural Products (TART CHERRY ADVANCED) CAPS Take 1 capsule by mouth 2 (two) times daily.     nitroGLYCERIN (NITROSTAT) 0.4 MG SL tablet Place 1 tablet (0.4 mg total) under the tongue every 5 (five) minutes as needed for chest pain. 25 tablet 12   nystatin cream (MYCOSTATIN) APPLY TO AFFECTED AREA TWICE A DAY 75 g 0   omeprazole (PRILOSEC) 40 MG capsule TAKE 1 CAPSULE BY MOUTH EVERY DAY 90 capsule 0   ondansetron (ZOFRAN) 4 MG tablet TAKE 1 TABLET BY MOUTH EVERY 8 HOURS AS NEEDED FOR NAUSEA AND VOMITING 30 tablet 1   Oxycodone HCl 10 MG TABS      Red Yeast Rice 600 MG CAPS Take 2 capsules by mouth daily.     Respiratory Therapy Supplies (NEBULIZER) DEVI Use as instructed for albuterol nebulization, with necessary tubing 1 each 0   rifaximin (XIFAXAN) 550 MG TABS tablet Take 1 tablet (550 mg total) by mouth 2 (two) times daily. 60 tablet 0   spironolactone (ALDACTONE) 100 MG tablet TAKE 1 TABLET BY MOUTH EVERY DAY 90 tablet 1   vitamin B-12 (CYANOCOBALAMIN) 500 MCG tablet Take 1 tablet (500 mcg total) by mouth every Monday, Wednesday,  and Friday.     No current facility-administered medications for this visit.    Allergies as of 02/01/2021 - Review Complete 12/15/2020  Allergen Reaction Noted   Statins Shortness Of Breath 01/22/2012   Losartan Other (See Comments) 01/30/2013   Wellbutrin [bupropion] Other (See Comments) 08/03/2020   Allopurinol Nausea Only 12/18/2012   Baclofen Nausea And Vomiting 06/23/2015   Penicillins Nausea And Vomiting 02/20/2018   Tramadol Nausea Only 03/13/2017    Review of Systems:    All systems reviewed and negative except where noted in HPI.  General Appearance:    Alert, cooperative, no distress, appears stated age  Head:    Normocephalic, without obvious abnormality, atraumatic  Eyes:    PERRL, conjunctiva/corneas clear,  Ears:    Grossly normal hearing  Neurologic:  Grossly normal    Observations/Objective:  Labs: CMP     Component Value Date/Time   NA 138 12/03/2020 1156   NA 135 12/09/2019 1613   K 4.2 12/03/2020 1156   CL 104 12/03/2020 1156   CO2 31 12/03/2020 1156   GLUCOSE 119 (H) 12/03/2020 1156   BUN 15 12/03/2020 1156   BUN 13 12/09/2019 1613   CREATININE 0.84 12/03/2020 1156   CREATININE 0.80 04/09/2015 1530   CALCIUM 8.3 (L) 12/03/2020 1156   PROT 7.0 12/03/2020 1156   PROT 7.1 12/09/2019 1613   ALBUMIN 2.7 (L) 12/03/2020 1156   ALBUMIN 3.0 (L) 12/09/2019 1613   AST 23 12/03/2020 1156   ALT 11 12/03/2020 1156   ALKPHOS 197 (H) 12/03/2020 1156   BILITOT 1.0 12/03/2020 1156   BILITOT 1.1 12/09/2019 1613   GFRNONAA >60 07/14/2020 1231   GFRNONAA >89 01/05/2014 1159   GFRAA 107 12/09/2019 1613   GFRAA >89 01/05/2014 1159   Lab Results  Component Value Date   WBC 3.1 (L) 12/03/2020   HGB 10.8 (L) 12/03/2020   HCT 31.1 (L) 12/03/2020   MCV 106.0 (H) 12/03/2020   PLT 43.0 (LL) 12/03/2020    Imaging Studies: No results found.  Assessment and Plan:   CALIL AMOR is a 65 y.o. y/o male  here to follow up  for  liver cirrhosis likely  secondary to [facility] with ascites . Autoimmune work up shows a positive smooth muscle ab and elevated immunoglobulins  .MELD 7 in 12/2020  Liver biopsy is inconclusive due to burnt out liver.    No evidence of hepatic encephalopathy.     Plan  1.  Continue Aldactone 100, Lasix 40, lactulose titrated to 1-2 soft bowel movements per day.. Commence on Xifaxan for hepatic encephelopathy 2.  Right upper quadrant ultrasound to screen for [facility] ordered 3.  EGD to screen for varices in 2022 December  4.  Low-salt diet 5. Refills for aldactone ,lasix xifaxan  6. Office visit this month to see him in person this month   I have discussed alternative options, risks & benefits,  which include, but are not limited to, bleeding, infection, perforation,respiratory complication & drug reaction.  The patient agrees with this plan & written consent will be obtained.      I discussed the assessment and treatment plan with the patient. The patient was provided an opportunity to ask questions and all were answered. The patient agreed with the plan and demonstrated an understanding of the instructions.   The patient was advised to call back or seek an in-person evaluation if the symptoms worsen or if the condition fails to improve as anticipated.  I provided 20 minutes of face-to-face time during this encounter.  Dr Miguel Bellows MD,MRCP [facility]) Gastroenterology/Hepatology Pager: [phone]   Speech recognition software was used to dictate this note.

## 2021-02-01 NOTE — Addendum Note (Signed)
Addended by: Wayna Chalet on: 02/01/2021 03:32 PM   Modules accepted: Orders

## 2021-02-03 ENCOUNTER — Other Ambulatory Visit: Payer: Self-pay

## 2021-02-03 MED ORDER — RIFAXIMIN 550 MG PO TABS: 550.0000 mg | ORAL_TABLET | Freq: Two times a day (BID) | ORAL | 11 refills | Status: DC

## 2021-02-08 ENCOUNTER — Other Ambulatory Visit (INDEPENDENT_AMBULATORY_CARE_PROVIDER_SITE_OTHER): Payer: Self-pay | Admitting: Vascular Surgery

## 2021-02-08 DIAGNOSIS — I714 Abdominal aortic aneurysm, without rupture, unspecified: Secondary | ICD-10-CM

## 2021-02-09 NOTE — Progress Notes (Signed)
MRN : [id number]  [name] is a 65 y.o. (July 02, 1955) male who presents with chief complaint of check my AAA.  History of Present Illness:   The patient is seen for follow up evaluation of AAA status post duplex ultrasound.   He is s/p fenestrated endograft on 6/27.2017.  The patient denies interval development of abdominal or back pain. No new lower extremity pain or discoloration of the toes.  He is currently in a wheelchair waiting for a hip replacement.    The patient has a history of coronary artery disease, no recent episodes of angina or shortness of breath. The patient denies interval anaurosis fugax. There is o recent history of TIA symptoms or focal motor deficits. The patient denies PAD or claudication symptoms. There is a history of hyperlipidemia which is being treated with a statin.    Previous CT angiography of the abdomen and pelvis shows a patent endograft with suprarenal fixation there is moderate stenosis of the celiac which is noted in prior scans unchanged SMA is patent the bilateral renal stents are patent the infrarenal AAA sac measures 2.6 cm.  No endo-leaks are noted.   Duplex ultrasound of the aorta iliac arteries shows a Stent graft in good position no endoleak with a sac size of 2.96 cm.  (Previous duplex ultrasound of the aorta iliac arteries shows a Stent graft in good position no endoleak with a sac size of 2.95 cm)  No outpatient medications have been marked as taking for the 02/10/21 encounter (Appointment) with Delana Meyer, Dolores Lory, MD.    Past Medical History:  Diagnosis Date   AAA (abdominal aortic aneurysm) ([place]) 09/2012--  monitored by dr Trula Slade   stable 5.6cm CTA abdomen 2016   Abnormal drug screen 07/09/2016   1/2/018 - positive oxycodone, fentanyl, inapprop positive MJ - mod risk   Allergic rhinitis    Ascites 03/2019   B12 deficiency    CAD (coronary artery disease) cardiologist-  dr Stanford Breed   x3 with stents last 2005, EF 40%,  predominantly RCA by CT 2016   Cataracts, bilateral    Cervical spondylosis 05/2010   s/p surgery   Charcot Lelan Pons Tooth muscular atrophy dx  1975   neurologist--  dr love--  type 2 per pt   Chronic pain syndrome    [facility] with [facility] --> disagreement and transfered care to Dr Sanjuan Dame at [facility] clinic [facility], requests PCP write Rx but f/u with pain clinic Q6-12 months   COPD (chronic obstructive pulmonary disease) ([place]) 10/2011   minimal by PFTs   DDD (degenerative disc disease)    Disturbances of sensation of smell and taste    improving   Dyspnea on exertion    GERD (gastroesophageal reflux disease)    Gout    Headache    Hepatitis    hepatitis B   Hidradenitis    right groin   Hidradenitis suppurativa dx 2011   goin and leg crease   followd by Lyndle Herrlich - daily bactrim, s/p intralesional steroid injection 10/2010   Hip osteoarthritis    s/p intraarticular steroid shot (12/2012) ([place])   History of hepatitis B 1983   History of MI (myocardial infarction)    2000  &  2005   History of pneumonia    History of viral meningitis 2000   HLD (hyperlipidemia)    HTN (hypertension)    Ischemic cardiomyopathy    s/p inferior MI  --  current ef per [facility]  39%   Liver cirrhosis secondary to NASH ([place]) 01/2014   by CT scan, rec virtual colonoscopy by Dr Collene Mares 06/2014   Lumbar herniated disc    Myocardial infarction [facility]    x2   Nocturia more than twice per night    Obesity    Spinal stenosis    released from [place].  established with [facility] (07/2013)   T2DM (type 2 diabetes mellitus) ([place])    ABIs WNL 2016   Vitamin D deficiency     Past Surgical History:  Procedure Laterality Date   ABDOMINAL AORTIC ENDOVASCULAR FENESTRATED STENT GRAFT N/A 11/30/2015   Procedure: ABDOMINAL AORTIC ENDOVASCULAR FENESTRATED STENT GRAFT;  Surgeon: Serafina Mitchell, MD;  Location: [place];  Service: Vascular;  Laterality: N/A;   ANTERIOR CERVICAL  DECOMP/DISCECTOMY FUSION  01-07-2010    C4 -- C7   CARDIAC CATHETERIZATION  03-30-2005  dr Albertine Patricia   ef 40% w/ inferior akinesis/  LM and CFX angiographically normal/  pLAD 30%/   Widely patent stents in RCA and PDA widely patent   CARDIOVASCULAR STRESS TEST  10-23-2012  dr Stanford Breed   No ischemia/  Moderate scar in the inferior wall, otherwise normal perfusion/  LV ef 39%,  LV wall motion: inferior/ inferolateral hypokinesis   COLONOSCOPY  05/06/2007   normal, small int hemorrhoids rpt 5 yrs due to fmhx - rec against rpt colonoscopy by Dr Collene Mares   CORONARY ANGIOPLASTY  2000  dr Stanford Breed   PCI to RCA and PDA   CORONARY ANGIOPLASTY WITH STENT PLACEMENT  03-19-2005  dr Gwyndolyn Saxon downey   inferior STEMI--- DES x4 to RCA w/ balloon angioplasty and balloon angioplasty to jailed PDA ostium/  severe hypokinesis of midinferor wall, ef 50%/  dLM 20%,  mLAD 20%,  dCFX 60%   ESOPHAGOGASTRODUODENOSCOPY  01/2017   dilated benign esophageal stenosis, portal hypertensive gastropathy Henrene Pastor)   ESOPHAGOGASTRODUODENOSCOPY (EGD) WITH PROPOFOL N/A 02/20/2018   benign biopsy Vicente Males, Bailey Mech, MD)   ESOPHAGOGASTRODUODENOSCOPY (EGD) WITH PROPOFOL N/A 05/12/2019   Procedure: ESOPHAGOGASTRODUODENOSCOPY (EGD) WITH PROPOFOL;  Surgeon: [name], MD;  Location: [facility];  Service: Gastroenterology;  Laterality: N/A;   HYDRADENITIS EXCISION Right 12/31/2014   Procedure: WIDE EXCISION HIDRADENITIS GROIN; Coralie Keens, MD   IR PARACENTESIS  03/25/2019   LUMBAR DISC SURGERY     L5-S1   LUMBAR LAMINECTOMY  05-18-2010   L2--5   laminectomy/foraminotomy for stenosis ([place])   MYELOGRAM     L5-S1 and L1-2 spondylosis   SACROILIAC JOINT INJECTION Bilateral 10/2013   [place]   TONSILLECTOMY AND ADENOIDECTOMY  1972    Social History Social History   Tobacco Use   Smoking status: Every Day    Packs/day: 1.00    Years: 57.00    Pack years: 57.00    Types: Cigarettes, E-cigarettes    Start date: 06/06/1967    Last  attempt to quit: 07/21/2019    Years since quitting: 1.5   Smokeless tobacco: Never   Tobacco comments:    stopped smoking a pipe in 2015  DOES SMOKE E CIG  Vaping Use   Vaping Use: Former  Substance Use Topics   Alcohol use: No    Alcohol/week: 0.0 standard drinks   Drug use: Yes    Types: Fentanyl, Hydrocodone    Family History Family History  Problem Relation Age of Onset   Cancer Mother        colon   Diabetes Mother    Kidney disease Mother    Aneurysm Mother  AAA   Rheum arthritis Mother    Charcot-Marie-Tooth disease Mother    Heart disease Mother        before age 65   Cancer Father        skin   Heart attack Father    Heart disease Father        before age 34   Cancer Brother        skin   Coronary artery disease Brother    Cancer Brother        small cell lung cancer   Aneurysm Brother        AAA   Rheum arthritis Sister    Rheum arthritis Brother    Prostate cancer Neg Hx    Bladder Cancer Neg Hx    Kidney cancer Neg Hx     Allergies  Allergen Reactions   Statins Shortness Of Breath    Cough, trouble breathing Cough, trouble breathing   Losartan Other (See Comments)    Causes him to have pain   Wellbutrin [Bupropion] Other (See Comments)    Worsened mood - crying   Allopurinol Nausea Only   Baclofen Nausea And Vomiting   Penicillins Nausea And Vomiting    Did it involve swelling of the face/tongue/throat, SOB, or low BP? N/A Did it involve sudden or severe rash/hives, skin peeling, or any reaction on the inside of your mouth or nose? N/A Did you need to seek medical attention at a hospital or doctor's office? N/A When did it last happen? Child     If all above answers are "NO", may proceed with cephalosporin use.   Tramadol Nausea Only     REVIEW OF SYSTEMS (Negative unless checked)  Constitutional: [] Weight loss  [] Fever  [] Chills Cardiac: [] Chest pain   [] Chest pressure   [] Palpitations   [] Shortness of breath when laying flat    [] Shortness of breath with exertion. Vascular:  [] Pain in legs with walking   [] Pain in legs at rest  [] History of DVT   [] Phlebitis   [] Swelling in legs   [] Varicose veins   [] Non-healing ulcers Pulmonary:   [] Uses home oxygen   [] Productive cough   [] Hemoptysis   [] Wheeze  [] COPD   [] Asthma Neurologic:  [] Dizziness   [] Seizures   [] History of stroke   [] History of TIA  [] Aphasia   [] Vissual changes   [] Weakness or numbness in arm   [] Weakness or numbness in leg Musculoskeletal:   [] Joint swelling   [] Joint pain   [] Low back pain Hematologic:  [] Easy bruising  [] Easy bleeding   [] Hypercoagulable state   [] Anemic Gastrointestinal:  [] Diarrhea   [] Vomiting  [] Gastroesophageal reflux/heartburn   [] Difficulty swallowing. Genitourinary:  [] Chronic kidney disease   [] Difficult urination  [] Frequent urination   [] Blood in urine Skin:  [] Rashes   [] Ulcers  Psychological:  [] History of anxiety   []  History of major depression.  Physical Examination  There were no vitals filed for this visit. There is no height or weight on file to calculate BMI. Gen: WD/WN, NAD Head: [place]/AT, No temporalis wasting.  Ear/Nose/Throat: Hearing grossly intact, nares w/o erythema or drainage Eyes: PER, EOMI, sclera nonicteric.  Neck: Supple, no masses.  No bruit or JVD.  Pulmonary:  Good air movement, no audible wheezing, no use of accessory muscles.  Cardiac: RRR, normal S1, S2, no Murmurs. Vascular:   No popliteal enlargement Vessel Right Left  Radial Palpable Palpable  Carotid Palpable Palpable  PT Palpable Palpable  DP Palpable Palpable  Gastrointestinal:  soft, non-distended. No guarding/no peritoneal signs.  Musculoskeletal: M/S 5/5 throughout.  No visible deformity.  Neurologic: CN 2-12 intact. Pain and light touch intact in extremities.  Symmetrical.  Speech is fluent. Motor exam as listed above. Psychiatric: Judgment intact, Mood & affect appropriate for pt's clinical situation. Dermatologic: No rashes or  ulcers noted.  No changes consistent with cellulitis.   CBC Lab Results  Component Value Date   WBC 3.1 (L) 12/03/2020   HGB 10.8 (L) 12/03/2020   HCT 31.1 (L) 12/03/2020   MCV 106.0 (H) 12/03/2020   PLT 43.0 (LL) 12/03/2020    BMET    Component Value Date/Time   NA 138 12/03/2020 1156   NA 135 12/09/2019 1613   K 4.2 12/03/2020 1156   CL 104 12/03/2020 1156   CO2 31 12/03/2020 1156   GLUCOSE 119 (H) 12/03/2020 1156   BUN 15 12/03/2020 1156   BUN 13 12/09/2019 1613   CREATININE 0.84 12/03/2020 1156   CREATININE 0.80 04/09/2015 1530   CALCIUM 8.3 (L) 12/03/2020 1156   GFRNONAA >60 07/14/2020 1231   GFRNONAA >89 01/05/2014 1159   GFRAA 107 12/09/2019 1613   GFRAA >89 01/05/2014 1159   CrCl cannot be calculated (Patient's most recent lab result is older than the maximum 21 days allowed.).  COAG Lab Results  Component Value Date   INR 1.3 (H) 12/03/2020   INR 1.3 (H) 07/30/2020   INR 1.1 12/09/2019    Radiology No results found.   Assessment/Plan 1. Abdominal aortic aneurysm (AAA) without rupture (HCC) Recommend: Patient is status post successful endovascular repair of the AAA.   No further intervention is required at this time.   No endoleak is detected and the aneurysm sac is stable.  The patient will continue antiplatelet therapy as prescribed as well as aggressive management of hyperlipidemia. Exercise is again strongly encouraged.   However, endografts require continued surveillance with ultrasound or CT scan. This is mandatory to detect any changes that allow repressurization of the aneurysm sac.  The patient is informed that this would be asymptomatic.  The patient is reminded that lifelong routine surveillance is a necessity with an endograft. Patient will continue to follow-up at 6 month intervals with ultrasound of the aorta.  - VAS US AORTA/IVC/ILIACS; Future  2. Aortic atherosclerosis (HCC)  Recommend:  The patient has evidence of atherosclerosis  of the lower extremities with claudication.  The patient does not voice lifestyle limiting changes at this point in time.  Noninvasive studies do not suggest clinically significant change.  No invasive studies, angiography or surgery at this time The patient should continue walking and begin a more formal exercise program.  The patient should continue antiplatelet therapy and aggressive treatment of the lipid abnormalities  No changes in the patient's medications at this time  The patient should continue wearing graduated compression socks 10-15 mmHg strength to control the mild edema.    3. Carotid stenosis, asymptomatic, bilateral Recommend:  Given the patient's asymptomatic subcritical stenosis no further invasive testing or surgery at this time.  Duplex ultrasound shows <50% stenosis bilaterally.  Continue antiplatelet therapy as prescribed Continue management of CAD, HTN and Hyperlipidemia Healthy heart diet,  encouraged exercise at least 4 times per week Follow up in 12 months with duplex ultrasound and physical exam    4. Coronary artery disease involving native coronary artery of native heart without angina pectoris Continue cardiac and antihypertensive medications as already ordered and reviewed, no changes at this time.  Continue statin  as ordered and reviewed, no changes at this time  Nitrates PRN for chest pain   5. Essential hypertension Continue antihypertensive medications as already ordered, these medications have been reviewed and there are no changes at this time.     Miguel Pilar, MD  02/09/2021 9:00 AM

## 2021-02-10 ENCOUNTER — Ambulatory Visit (INDEPENDENT_AMBULATORY_CARE_PROVIDER_SITE_OTHER): Payer: Medicare HMO

## 2021-02-10 ENCOUNTER — Encounter (INDEPENDENT_AMBULATORY_CARE_PROVIDER_SITE_OTHER): Payer: Self-pay | Admitting: Vascular Surgery

## 2021-02-10 ENCOUNTER — Ambulatory Visit (INDEPENDENT_AMBULATORY_CARE_PROVIDER_SITE_OTHER): Payer: Medicare HMO | Admitting: Vascular Surgery

## 2021-02-10 ENCOUNTER — Other Ambulatory Visit: Payer: Self-pay

## 2021-02-10 VITALS — BP 112/67 | HR 71 | Ht 69.0 in | Wt 277.0 lb

## 2021-02-10 DIAGNOSIS — I700 Atherosclerosis of aorta: Secondary | ICD-10-CM | POA: Diagnosis not present

## 2021-02-10 DIAGNOSIS — I6523 Occlusion and stenosis of bilateral carotid arteries: Secondary | ICD-10-CM

## 2021-02-10 DIAGNOSIS — I714 Abdominal aortic aneurysm, without rupture, unspecified: Secondary | ICD-10-CM

## 2021-02-10 DIAGNOSIS — I10 Essential (primary) hypertension: Secondary | ICD-10-CM | POA: Diagnosis not present

## 2021-02-10 DIAGNOSIS — I2510 Atherosclerotic heart disease of native coronary artery without angina pectoris: Secondary | ICD-10-CM

## 2021-02-11 ENCOUNTER — Ambulatory Visit
Admission: RE | Admit: 2021-02-11 | Discharge: 2021-02-11 | Disposition: A | Discharge status: Home / Self Care | Payer: Medicare HMO | Source: Ambulatory Visit | Attending: Gastroenterology | Admitting: Gastroenterology

## 2021-02-11 DIAGNOSIS — K7460 Unspecified cirrhosis of liver: Secondary | ICD-10-CM | POA: Diagnosis not present

## 2021-02-11 DIAGNOSIS — R188 Other ascites: Secondary | ICD-10-CM | POA: Insufficient documentation

## 2021-02-11 DIAGNOSIS — K838 Other specified diseases of biliary tract: Secondary | ICD-10-CM | POA: Diagnosis not present

## 2021-02-13 ENCOUNTER — Encounter (INDEPENDENT_AMBULATORY_CARE_PROVIDER_SITE_OTHER): Payer: Self-pay | Admitting: Vascular Surgery

## 2021-02-14 ENCOUNTER — Telehealth: Payer: Self-pay | Admitting: Family Medicine

## 2021-02-14 DIAGNOSIS — M1611 Unilateral primary osteoarthritis, right hip: Secondary | ICD-10-CM

## 2021-02-14 DIAGNOSIS — G894 Chronic pain syndrome: Secondary | ICD-10-CM

## 2021-02-14 DIAGNOSIS — G8929 Other chronic pain: Secondary | ICD-10-CM

## 2021-02-14 DIAGNOSIS — M5030 Other cervical disc degeneration, unspecified cervical region: Secondary | ICD-10-CM

## 2021-02-14 NOTE — Telephone Encounter (Signed)
Pt's wife called to request a referral be placed for pt to have a new pain management doctor. States the one he was going to dismissed him. She is requesting he go to [facility].   Wife is also wondering if there is any chance something can be called into [facility] for pt's pain. He has been on Oxycodone and Fentanyl. Please advise.

## 2021-02-17 NOTE — Telephone Encounter (Signed)
Pt wife called to know the status of referral for pain  management . Would like a call back #[phone]

## 2021-02-21 ENCOUNTER — Encounter: Payer: Self-pay | Admitting: Gastroenterology

## 2021-02-21 ENCOUNTER — Ambulatory Visit: Payer: Medicare HMO | Admitting: Gastroenterology

## 2021-02-21 ENCOUNTER — Other Ambulatory Visit: Payer: Self-pay

## 2021-02-21 VITALS — BP 148/75 | HR 87 | Temp 99.2°F | Ht 69.0 in | Wt 272.0 lb

## 2021-02-21 DIAGNOSIS — K7290 Hepatic failure, unspecified without coma: Secondary | ICD-10-CM

## 2021-02-21 DIAGNOSIS — K7460 Unspecified cirrhosis of liver: Secondary | ICD-10-CM

## 2021-02-21 DIAGNOSIS — R188 Other ascites: Secondary | ICD-10-CM | POA: Diagnosis not present

## 2021-02-21 DIAGNOSIS — K7682 Hepatic encephalopathy: Secondary | ICD-10-CM

## 2021-02-21 MED ORDER — FUROSEMIDE 40 MG PO TABS: 40.0000 mg | ORAL_TABLET | Freq: Every day | ORAL | 1 refills | Status: DC

## 2021-02-21 MED ORDER — SPIRONOLACTONE 100 MG PO TABS: 100.0000 mg | ORAL_TABLET | Freq: Every day | ORAL | 1 refills | Status: DC

## 2021-02-21 NOTE — Progress Notes (Signed)
[name] MD, MRCP(U.K) [address]  [place]  [place], [place] [address]  Main: [phone]  Fax: [phone]   Primary Care Physician: Ria Bush, MD  Primary Gastroenterologist:  Dr. [name]   Chief Complaint  Patient presents with   Cirrhosis    HPI: [name] is a 65 y.o. male  Summary of history :   He was initially referred and seen on 02/13/18 for cirrhosis of the liver . He has been seen by [facility] in the past.. His Cirrhosis had been attriuted to NASH. Used cocaine in his 20's, none recently. Weighed 370 lbs at his heaviest. Was always " big", he is not diabetic.      CT angio of the abdomen 12/2017:Post endovascular repair of suprarenal abdominal aortic aneurysm without evidence of complication .Suspected hemodynamically significant stenosis involving the origin of the common hepatic artery, similar to the 12/2015 examination.Potential hemodynamically significant narrowing involving theproximal aspect of the right SFA.Findings of hepatic cirrhosis and portal venous hypertension with several varices about the gastroesophageal junction, splenomegaly and trace amount of intra-abdominal ascites. No discrete hyperenhancing hepatic lesions   02/13/18 : Cr 1.03,Hep A ab positive , albumin 3.0 ,alk phos 149 , Hbsab/ag,HCV ab , ANA,negative .Ceruloplasmin ,alpha 1 AT,Iron studies - normal  03/19/18 : liver biopsy : steatosis , focal plasma cells, mild chronic cholestasis . Unmable to get a diagnosis due to burned out liver disease.   05/15/2019: MRI liver cirrhotic liver, dilated CBD, no obstruction . Splenomegaly and trace ascites 05/12/2019: EGD: Normal    Smooth muscle antibody positive , elevated immunoglobulins G, AMA,LKM ab  negative Hospital admission in 03/26/2019 for ascites, underwent paracentesis.   07/14/2020 CT scan of the chest abdomen pelvis with contrast shows findings consistent with cirrhosis and portal hypertension stable common bile  duct dilation since 2020      Interval history 02/01/2021-02/21/2021   12/03/2020: Meld score of 9 02/14/2021: Ultrasound right upper quadrant liver cirrhosis mild dilated Tatian of the common bile duct measuring 12 mm stable since 2020.  He is doing well.  Request Lasix and Aldactone refills.  Not taking Xifaxan as he has not completed the paperwork required for the same.  Taking lactulose on and off.  Not having regular bowel movements.  Daytime somnolence.  Has had some falls.     Current Outpatient Medications  Medication Sig Dispense Refill   albuterol (PROVENTIL) (2.5 MG/3ML) 0.083% nebulizer solution USE 1 VIAL PER NEBULIZER EVERY 6 HRS AS NEEDED FOR WHEEZING 75 mL 6   albuterol (VENTOLIN HFA) 108 (90 Base) MCG/ACT inhaler Inhale 2 puffs into the lungs every 6 (six) hours as needed for wheezing or shortness of breath. 1 each 3   aspirin 81 MG tablet Take 1 tablet (81 mg total) by mouth daily.     B Complex-C (SUPER B COMPLEX PO) Take 1 tablet by mouth daily.     Ca Phosphate-Cholecalciferol [phone] MG-UNIT TABS Take 1 tablet by mouth daily.      cholecalciferol 2000 units TABS Take 2,000 Units by mouth daily.     DULoxetine (CYMBALTA) 60 MG capsule Take 1 capsule (60 mg total) by mouth daily. 90 capsule 3   fentaNYL (DURAGESIC) 75 MCG/HR      ferrous sulfate 325 (65 FE) MG tablet Take 1 tablet (325 mg total) by mouth every other day.     fluticasone (FLONASE) 50 MCG/ACT nasal spray Place 2 sprays into both nostrils daily as needed for allergies.  folic acid (FOLVITE) 1 MG tablet TAKE 1 TABLET BY MOUTH EVERY DAY 90 tablet 0   furosemide (LASIX) 40 MG tablet TAKE 1 TABLET BY MOUTH EVERY DAY 90 tablet 1   gabapentin (NEURONTIN) 300 MG capsule Take 1 capsule (300 mg total) by mouth 3 (three) times daily.     Lactulose 20 GM/30ML SOLN Take 30 mLs (20 g total) by mouth 3 (three) times daily. 2700 mL 5   methocarbamol (ROBAXIN) 500 MG tablet Take 1 tablet (500 mg total) by mouth at  bedtime. 30 tablet 3   metoprolol succinate (TOPROL-XL) 25 MG 24 hr tablet Take 1 tablet (25 mg total) by mouth daily. 90 tablet 3   Misc Natural Products (TART CHERRY ADVANCED) CAPS Take 1 capsule by mouth 2 (two) times daily.     nitroGLYCERIN (NITROSTAT) 0.4 MG SL tablet Place 1 tablet (0.4 mg total) under the tongue every 5 (five) minutes as needed for chest pain. 25 tablet 12   nystatin cream (MYCOSTATIN) APPLY TO AFFECTED AREA TWICE A DAY 75 g 0   omeprazole (PRILOSEC) 40 MG capsule TAKE 1 CAPSULE BY MOUTH EVERY DAY 90 capsule 0   ondansetron (ZOFRAN) 4 MG tablet TAKE 1 TABLET BY MOUTH EVERY 8 HOURS AS NEEDED FOR NAUSEA AND VOMITING 30 tablet 1   Red Yeast Rice 600 MG CAPS Take 2 capsules by mouth daily.     Respiratory Therapy Supplies (NEBULIZER) DEVI Use as instructed for albuterol nebulization, with necessary tubing 1 each 0   rifaximin (XIFAXAN) 550 MG TABS tablet Take 1 tablet (550 mg total) by mouth 2 (two) times daily. 60 tablet 11   spironolactone (ALDACTONE) 100 MG tablet Take 1 tablet (100 mg total) by mouth daily. 90 tablet 1   vitamin B-12 (CYANOCOBALAMIN) 500 MCG tablet Take 1 tablet (500 mcg total) by mouth every Monday, Wednesday, and Friday.     No current facility-administered medications for this visit.    Allergies as of 02/21/2021 - Review Complete 02/21/2021  Allergen Reaction Noted   Statins Shortness Of Breath 01/22/2012   Losartan Other (See Comments) 01/30/2013   Wellbutrin [bupropion] Other (See Comments) 08/03/2020   Allopurinol Nausea Only 12/18/2012   Baclofen Nausea And Vomiting 06/23/2015   Penicillins Nausea And Vomiting 02/20/2018   Tramadol Nausea Only 03/13/2017    ROS:  General: Negative for anorexia, weight loss, fever, chills, fatigue, weakness. ENT: Negative for hoarseness, difficulty swallowing , nasal congestion. CV: Negative for chest pain, angina, palpitations, dyspnea on exertion, peripheral edema.  Respiratory: Negative for dyspnea  at rest, dyspnea on exertion, cough, sputum, wheezing.  GI: See history of present illness. GU:  Negative for dysuria, hematuria, urinary incontinence, urinary frequency, nocturnal urination.  Endo: Negative for unusual weight change.    Physical Examination:   BP (!) 148/75   Pulse 87   Temp 99.2 F (37.3 C) (Oral)   Ht 5' 9"  (1.753 m)   Wt 272 lb (123.4 kg)   BMI 40.17 kg/m   General: Well-nourished, well-developed in no acute distress.  Eyes: No icterus. Conjunctivae pink. Mouth: Oropharyngeal mucosa moist and pink , no lesions erythema or exudate. Abdomen: Bowel sounds are normal, nontender, nondistended, no hepatosplenomegaly or masses, no abdominal bruits or hernia , no rebound or guarding.   Extremities: No lower extremity edema. No clubbing or deformities. Neuro: Alert and oriented x 3.  Grossly intact. Skin: Warm and dry, no jaundice.   Psych: Alert and cooperative, normal mood and affect.   Imaging Studies:  VAS [place] EVAR DUPLEX  Result Date: 02/10/2021 Endovascular Aortic Repair Study (EVAR) Patient Name:  WIRT HEMMERICH  Date of Exam:   02/10/2021 Medical Rec #: [id number]           Accession #:    [id number] Date of Birth: 02-10-1956           Patient Gender: M Patient Age:   60 years Exam Location:  [facility] Procedure:      VAS [place] EVAR DUPLEX Referring Phys: Hortencia Pilar --------------------------------------------------------------------------------  Vascular Interventions: 11/30/15: Fenestrated stent graft repair of AAA in                         [place];. Limitations: Air/bowel gas, obesity and aorta not visualized well. Bilateral limbs NV.  Comparison Study: 01/08/2020 Performing Technologist: Charlane Ferretti RT (R)(VS)  Examination Guidelines: A complete evaluation includes B-mode imaging, spectral Doppler, color Doppler, and power Doppler as needed of all accessible portions of each vessel. Bilateral testing is considered an integral part of a complete  examination. Limited examinations for reoccurring indications may be performed as noted.  Endovascular Aortic Repair (EVAR): +----------+----------------+-------------------+-------------------+--------+           Diameter AP (cm)Diameter Trans (cm)Velocities (cm/sec)Comments +----------+----------------+-------------------+-------------------+--------+ Aorta     2.96            2.94               105                         +----------+----------------+-------------------+-------------------+--------+ Right Limb                                   114                NV       +----------+----------------+-------------------+-------------------+--------+ Left Limb                                    115                NV       +----------+----------------+-------------------+-------------------+--------+  Summary: Abdominal Aorta: Patent endovascular aneurysm repair with no evidence of endoleak. The largest aortic diameter remains essentially unchanged compared to prior exam. Previous diameter measurement was 3.0 cm obtained on 01/08/2020.  *See table(s) above for measurements and observations.  Electronically signed by Hortencia Pilar MD on 02/10/2021 at 5:06:37 PM.   Final    US Abdomen Limited RUQ (LIVER/GB)  Result Date: 02/14/2021 CLINICAL DATA:  Hepatic cirrhosis, HCC screen. EXAM: ULTRASOUND ABDOMEN LIMITED RIGHT UPPER QUADRANT COMPARISON:  CT July 14, 2020. FINDINGS: Gallbladder: Mild chronic wall thickening of the gallbladder measuring up to 3.5 mm, likely reflecting sequela of chronic hepatic disease. No sonographic Murphy sign noted by sonographer. Common bile duct: Diameter: 1.2 cm, similar to prior Liver: Cirrhotic hepatic morphology. No discrete hepatic lesion visualized. Portal vein is patent on color Doppler imaging with normal direction of blood flow towards the liver. Other: None. IMPRESSION: Cirrhotic hepatic morphology. No discrete hepatic lesion visualized. Mild dilation  of the common bile duct measuring up to 1.2 cm, stable back to at least 2020. Mild chronic wall thickening of the gallbladder measuring up to 3.5 mm, likely reflecting sequela of chronic hepatic disease. Electronically Signed  By: Dahlia Bailiff M.D.   On: 02/14/2021 10:46    Assessment and Plan:   [name] is a 65 y.o. y/o male   here to follow up  for  liver cirrhosis likely secondary to [facility] with ascites . Autoimmune work up shows a positive smooth muscle ab and elevated immunoglobulins  .MELD 7 in 12/2020  Liver biopsy is inconclusive due to burnt out liver.  Does have features of hepatic encephalopathy.  Not obtained Xifaxan yet.   No prior evidence of esophageal varices.     Plan  1.  Continue Lasix 40 mg and her Aldactone 100 mg/day.  Complete paperwork for Xifaxan .suggested to be compliant with lactulose for hepatic encephalopathy. 2.  Right upper quadrant ultrasound to screen for [facility] in March 2023 3.  EGD to screen for varices in 2022 December  4.  Low-salt diet 5.  Lasix and Aldactone refills provided   Dr [name]  MD,MRCP [facility]) Follow up in 3-4 months

## 2021-02-21 NOTE — Telephone Encounter (Signed)
Patient's wife called stating that her husband has been out of all of his pain medication for over a week.  Patient's wife stated that her husband has been in the bed for a week. Patient's wife stated that her husband has been taking Naprosyn but is not suppose to take that type of medication because the problems with his liver. Patient's wife stated that the pain management doctor got upset with her husband because he took only one extra pain pill. Patient's wife stated that her husband is use to having a fentanyl patch and oxycodone. Patient's wife wants to know if Dr. Danise Mina will prescribe him something for pain until he can get in with [facility] pain management. [name] stated that she is hoping to get him in with [facility] within a month. Pharmacy  [facility]

## 2021-02-21 NOTE — Addendum Note (Signed)
Addended by: Wayna Chalet on: 02/21/2021 05:02 PM   Modules accepted: Orders

## 2021-02-21 NOTE — Addendum Note (Signed)
Addended by: Ria Bush on: 02/21/2021 08:12 AM   Modules accepted: Orders

## 2021-02-21 NOTE — Telephone Encounter (Signed)
New referral placed to [facility] per pt/wife request.

## 2021-02-21 NOTE — Telephone Encounter (Signed)
Left message on voicemail for patient to call the office back.

## 2021-02-22 MED ORDER — FENTANYL 75 MCG/HR TD PT72: 1.0000 | MEDICATED_PATCH | TRANSDERMAL | 0 refills | Status: DC

## 2021-02-22 NOTE — Telephone Encounter (Addendum)
I have refilled fentanyl patch 89mg to use Q48 hours as per latest records in [place]  Oxycodone not filled since 12/2020 Recommend they schedule tylenol 5027mTID, liver should do ok with this lower dose.  [place] CSRS reviewed.  Please request records from pain clinic to review.

## 2021-02-22 NOTE — Telephone Encounter (Signed)
Noted.  Faxed records requests.  FYI to Dr. Darnell Level.

## 2021-02-22 NOTE — Addendum Note (Signed)
Addended by: Ria Bush on: 02/22/2021 08:36 AM   Modules accepted: Orders

## 2021-02-22 NOTE — Telephone Encounter (Signed)
Patient's wife [name] notified as instructed by telephone. [name] stated that her husband's pain medication was cut off because he was out of his pain medication and took one of his friend's Vicodin. Patient's wife stated that he was seeing a doctor at [facility]. Telephone number [phone] . I called and got the Fax # which is [phone] so that medical records can be requested.

## 2021-02-23 DIAGNOSIS — G600 Hereditary motor and sensory neuropathy: Secondary | ICD-10-CM | POA: Diagnosis not present

## 2021-02-24 NOTE — Telephone Encounter (Signed)
Received faxed message from [facility] stating they need records and radiology reports regarding pt's pain causes.  They also will need the las 3 visit notes from previous pain mgmt office.  Records need to be faxed to [facility] at [phone] ASAP, ATTN:  Juliann Pulse.  Lvm asking pt/pt's wife, [name] (on dpr) to call back.  Need to relay above info.  Pt will need to sign a medical release form at [facility] Pain [facility] or at previous pain mgmt office (Dr. Rachael Fee?) giving permission to fax records.

## 2021-02-25 ENCOUNTER — Telehealth: Payer: Self-pay | Admitting: Family Medicine

## 2021-02-25 NOTE — Telephone Encounter (Signed)
Received faxed message from [facility] stating they need records and radiology reports regarding pt's pain causes.  They also will need the las 3 visit notes from previous pain mgmt office.  Records need to be faxed to [facility] at [phone] ASAP, ATTN:  Juliann Pulse.  Lvm asking pt/pt's wife, [name] (on dpr) to call back.  Need to relay above info.  Pt will need to sign a medical release form at [facility] or at previous pain mgmt office (Dr. Rachael Fee?) giving permission to fax records.

## 2021-02-25 NOTE — Telephone Encounter (Signed)
Pt wife called stating that Dr Darnell Level can get the Pain Management Records from Dr Raenette Rover [phone]. If any question you can give her a call.

## 2021-02-25 NOTE — Telephone Encounter (Signed)
Lvm asking pt/pt's wife, [name] (on dpr) to call back.  Need to pt to sign medical records release before we can request records.  And sign release with Dr. Raenette Rover giving them permission to fax request records to [facility]. (See phn note, 02/14/21)

## 2021-02-28 NOTE — Telephone Encounter (Signed)
Attempted phn # [phone]) provided by Ms. Stocks for Dr. Burnard Bunting office.  However, no answer, no vm  and no message stating the number belonged to an office/doctor.   Lvm asking pt/pt's wife, [name] (on dpr) to call back.  We need a fax # for Dr. Burnard Bunting office so we can request records for our office.  But, pt still needs to sign a medical records release form with Dr. Raenette Rover giving them permission to fax last 3 office notes and radiology records to [facility] at [phone]. (See phn note, 02/14/21)

## 2021-03-02 NOTE — Telephone Encounter (Signed)
Spoke with pt's wife, [name], relaying message from [facility] Pain [facility].  States she will give them a call to speak with them directly about getting records from [place] (Dr. Burnard Bunting office).  [See phn note, 02/14/21]

## 2021-03-02 NOTE — Telephone Encounter (Signed)
Spoke with pt's wife, [name], relaying message from [facility].  States she will give them a call to speak with them directly about getting records from [place] (Dr. Burnard Bunting office).  [See phn note, 02/25/21]

## 2021-03-02 NOTE — Telephone Encounter (Addendum)
Spoke with pt's wife, [name], relaying message from [facility].  States she will give them a call to speak with them directly about getting records from [place] (Dr. Burnard Bunting office).  [See phn note, 02/14/21]

## 2021-03-11 ENCOUNTER — Telehealth: Payer: Self-pay

## 2021-03-11 NOTE — Chronic Care Management (AMB) (Addendum)
Chronic Care Management Pharmacy Assistant   Name: [name]  MRN: [id number] DOB: 16-Jul-1955  Reason for Encounter: General Adherence    Recent office visits:  None  since last CCM contact  Recent consult visits:  02/21/21-Gastroenterology-Patient resented for follow up cirrhosis of liver. Complete paperwork for Xiafaxan. Ultrasound ordered, Follow up 3-4 months. 02/10/21-Vascular Surgery-Patient presented for follow up abdominal aneurysm. No further intervention required. Follow up 6 months with ultrasound. 01/13/21- Byeolah Henson,PA - no data found  Hospital visits:  None in previous 6 months  Medications: Outpatient Encounter Medications as of 03/11/2021  Medication Sig   albuterol (PROVENTIL) (2.5 MG/3ML) 0.083% nebulizer solution USE 1 VIAL PER NEBULIZER EVERY 6 HRS AS NEEDED FOR WHEEZING   albuterol (VENTOLIN HFA) 108 (90 Base) MCG/ACT inhaler Inhale 2 puffs into the lungs every 6 (six) hours as needed for wheezing or shortness of breath.   aspirin 81 MG tablet Take 1 tablet (81 mg total) by mouth daily.   B Complex-C (SUPER B COMPLEX PO) Take 1 tablet by mouth daily.   Ca Phosphate-Cholecalciferol [phone] MG-UNIT TABS Take 1 tablet by mouth daily.    cholecalciferol 2000 units TABS Take 2,000 Units by mouth daily.   DULoxetine (CYMBALTA) 60 MG capsule Take 1 capsule (60 mg total) by mouth daily.   fentaNYL (DURAGESIC) 75 MCG/HR Place 1 patch onto the skin every other day for 15 doses.   ferrous sulfate 325 (65 FE) MG tablet Take 1 tablet (325 mg total) by mouth every other day.   fluticasone (FLONASE) 50 MCG/ACT nasal spray Place 2 sprays into both nostrils daily as needed for allergies.    folic acid (FOLVITE) 1 MG tablet TAKE 1 TABLET BY MOUTH EVERY DAY   furosemide (LASIX) 40 MG tablet Take 1 tablet (40 mg total) by mouth daily.   gabapentin (NEURONTIN) 300 MG capsule Take 1 capsule (300 mg total) by mouth 3 (three) times daily.   Lactulose 20 GM/30ML SOLN Take  30 mLs (20 g total) by mouth 3 (three) times daily.   methocarbamol (ROBAXIN) 500 MG tablet Take 1 tablet (500 mg total) by mouth at bedtime.   metoprolol succinate (TOPROL-XL) 25 MG 24 hr tablet Take 1 tablet (25 mg total) by mouth daily.   Misc Natural Products (TART CHERRY ADVANCED) CAPS Take 1 capsule by mouth 2 (two) times daily.   nitroGLYCERIN (NITROSTAT) 0.4 MG SL tablet Place 1 tablet (0.4 mg total) under the tongue every 5 (five) minutes as needed for chest pain.   nystatin cream (MYCOSTATIN) APPLY TO AFFECTED AREA TWICE A DAY   omeprazole (PRILOSEC) 40 MG capsule TAKE 1 CAPSULE BY MOUTH EVERY DAY   ondansetron (ZOFRAN) 4 MG tablet TAKE 1 TABLET BY MOUTH EVERY 8 HOURS AS NEEDED FOR NAUSEA AND VOMITING   Red Yeast Rice 600 MG CAPS Take 2 capsules by mouth daily.   Respiratory Therapy Supplies (NEBULIZER) DEVI Use as instructed for albuterol nebulization, with necessary tubing   rifaximin (XIFAXAN) 550 MG TABS tablet Take 1 tablet (550 mg total) by mouth 2 (two) times daily.   spironolactone (ALDACTONE) 100 MG tablet Take 1 tablet (100 mg total) by mouth daily.   vitamin B-12 (CYANOCOBALAMIN) 500 MCG tablet Take 1 tablet (500 mcg total) by mouth every Monday, Wednesday, and Friday.   No facility-administered encounter medications on file as of 03/11/2021.    03/16/21 attempted to call patient and was asked to not call back at this time. Was unable to confirm whether  patient would like to unenroll formally from CCM services as not provided the opportunity to introduce myself and CCM team.  Star Medications: Medication Name/mg Last Fill Days Supply None identified   Irbesartan 64m  01/21/21 90 Toprol XL 263m 12/12/20 90 Lasix 4030m 09/22/20 90 Spironolactone 100m72m30/22 90   Care Gaps: Annual wellness visit in last year? Yes   08/03/20 Most Recent BP reading:148/75  87-P  02/21/21  If Diabetic: Most recent A1C reading:  5.2  08/12/20 Last eye exam / retinopathy screening:  02/10/21 Last diabetic foot exam:  2018  PCP appointment on 03/22/21 and Cardiology appointment on 05/05/21  MichDebbora DusP notified  VelmAvel SensorMAIslandtonistant [phone] reviewed the care management and care coordination activities outlined in this encounter and I am certifying that I agree with the content of this note. Will schedule CCM follow up to discuss.   MichDebbora DusarmD Clinical Pharmacist [facility]-[phone]

## 2021-03-14 ENCOUNTER — Ambulatory Visit: Payer: Medicare HMO | Admitting: Family Medicine

## 2021-03-22 ENCOUNTER — Ambulatory Visit (INDEPENDENT_AMBULATORY_CARE_PROVIDER_SITE_OTHER): Payer: Medicare HMO | Admitting: Family Medicine

## 2021-03-22 ENCOUNTER — Other Ambulatory Visit: Payer: Self-pay

## 2021-03-22 ENCOUNTER — Telehealth: Payer: Self-pay | Admitting: *Deleted

## 2021-03-22 ENCOUNTER — Encounter: Payer: Self-pay | Admitting: Family Medicine

## 2021-03-22 ENCOUNTER — Telehealth: Payer: Self-pay

## 2021-03-22 VITALS — BP 134/62 | HR 74 | Temp 97.6°F | Ht 69.0 in | Wt 278.0 lb

## 2021-03-22 DIAGNOSIS — M5030 Other cervical disc degeneration, unspecified cervical region: Secondary | ICD-10-CM | POA: Diagnosis not present

## 2021-03-22 DIAGNOSIS — Z96641 Presence of right artificial hip joint: Secondary | ICD-10-CM

## 2021-03-22 DIAGNOSIS — G894 Chronic pain syndrome: Secondary | ICD-10-CM | POA: Diagnosis not present

## 2021-03-22 DIAGNOSIS — G8929 Other chronic pain: Secondary | ICD-10-CM | POA: Diagnosis not present

## 2021-03-22 DIAGNOSIS — R188 Other ascites: Secondary | ICD-10-CM | POA: Diagnosis not present

## 2021-03-22 DIAGNOSIS — K7460 Unspecified cirrhosis of liver: Secondary | ICD-10-CM

## 2021-03-22 DIAGNOSIS — M47812 Spondylosis without myelopathy or radiculopathy, cervical region: Secondary | ICD-10-CM | POA: Diagnosis not present

## 2021-03-22 DIAGNOSIS — M1611 Unilateral primary osteoarthritis, right hip: Secondary | ICD-10-CM

## 2021-03-22 LAB — CBC WITH DIFFERENTIAL/PLATELET
Basophils Absolute: 0 10*3/uL (ref 0.0–0.1)
Basophils Relative: 0.7 % (ref 0.0–3.0)
Eosinophils Absolute: 0.2 10*3/uL (ref 0.0–0.7)
Eosinophils Relative: 3.1 % (ref 0.0–5.0)
HCT: 34.8 % — ABNORMAL LOW (ref 39.0–52.0)
Hemoglobin: 11.8 g/dL — ABNORMAL LOW (ref 13.0–17.0)
Lymphocytes Relative: 18.3 % (ref 12.0–46.0)
Lymphs Abs: 1 10*3/uL (ref 0.7–4.0)
MCHC: 33.9 g/dL (ref 30.0–36.0)
MCV: 103.5 fl — ABNORMAL HIGH (ref 78.0–100.0)
Monocytes Absolute: 0.6 10*3/uL (ref 0.1–1.0)
Monocytes Relative: 10.7 % (ref 3.0–12.0)
Neutro Abs: 3.7 10*3/uL (ref 1.4–7.7)
Neutrophils Relative %: 67.2 % (ref 43.0–77.0)
Platelets: 45 10*3/uL — CL (ref 150.0–400.0)
RBC: 3.36 Mil/uL — ABNORMAL LOW (ref 4.22–5.81)
RDW: 16.6 % — ABNORMAL HIGH (ref 11.5–15.5)
WBC: 5.6 10*3/uL (ref 4.0–10.5)

## 2021-03-22 LAB — COMPREHENSIVE METABOLIC PANEL
ALT: 14 U/L (ref 0–53)
AST: 27 U/L (ref 0–37)
Albumin: 2.9 g/dL — ABNORMAL LOW (ref 3.5–5.2)
Alkaline Phosphatase: 173 U/L — ABNORMAL HIGH (ref 39–117)
BUN: 15 mg/dL (ref 6–23)
CO2: 29 mEq/L (ref 19–32)
Calcium: 8.6 mg/dL (ref 8.4–10.5)
Chloride: 103 mEq/L (ref 96–112)
Creatinine, Ser: 0.91 mg/dL (ref 0.40–1.50)
GFR: 88.11 mL/min (ref >60.00)
Glucose, Bld: 110 mg/dL — ABNORMAL HIGH (ref 70–99)
Potassium: 4.8 mEq/L (ref 3.5–5.1)
Sodium: 135 mEq/L (ref 135–145)
Total Bilirubin: 1.5 mg/dL — ABNORMAL HIGH (ref 0.2–1.2)
Total Protein: 7 g/dL (ref 6.0–8.3)

## 2021-03-22 LAB — PROTIME-INR
INR: 1.3 ratio — ABNORMAL HIGH (ref 0.8–1.0)
Prothrombin Time: 13.8 s — ABNORMAL HIGH (ref 9.6–13.1)

## 2021-03-22 MED ORDER — ACETAMINOPHEN 500 MG PO TABS: 500.0000 mg | ORAL_TABLET | Freq: Three times a day (TID) | ORAL | Status: DC

## 2021-03-22 MED ORDER — FENTANYL 75 MCG/HR TD PT72: 1.0000 | MEDICATED_PATCH | TRANSDERMAL | 0 refills | Status: AC

## 2021-03-22 NOTE — Patient Instructions (Addendum)
Call prior pain clinic to request records be sent to [facility] pain clinic.  Labs today.  Continue current medicines.  Urine drug screen today.  Return in 3 months for follow up visit.

## 2021-03-22 NOTE — Telephone Encounter (Signed)
[place] Night - Client Nonclinical Telephone Record  [facility] Client [name] - Client Client Site [place] Physician Ria Bush - MD Contact Type Call Who Is Calling Patient / Member / Family / Caregiver Caller Phone Number [phone] Call Type Message Only Information Provided Reason for Call Returning a Call from the Office Initial [name] states someone keeps leaving her messages and when she returns the call nobody answers. Her husband has an appointment tomorrow and she wants to know what the office is calling about. Additional Comment Patient - [name] DOB 1955-06-08 Patient's wife [name] says they will be there for [name]'s appointment tomorrow. Disp. Time Disposition Final User 03/21/2021 5:05:15 PM General Information Provided Yes Priscille Kluver Call Closed By: Priscille Kluver Transaction Date/Time: 03/21/2021 5:00:12 PM (ET

## 2021-03-22 NOTE — Telephone Encounter (Signed)
Santiago Glad at the lab called with a critical lab. Pt's platelet count is 45. Critical given to PCP and also message sent

## 2021-03-22 NOTE — Telephone Encounter (Signed)
Noted. Chronic issue due to liver disease.

## 2021-03-22 NOTE — Progress Notes (Signed)
Patient ID: [name], male    DOB: [birth year], 65 y.o.   MRN: [id number]  This visit was conducted in person.  BP 134/62   Pulse 74   Temp 97.6 F (36.4 C) (Temporal)   Ht 5' 9"  (1.753 m)   Wt 278 lb (126.1 kg)   SpO2 99%   BMI 41.05 kg/m    CC: 3 mo f/u visit  Subjective:   HPI: [name] is a 65 y.o. male presenting on 03/22/2021 for Follow-up (Here for 3 mo f/u.  Pt accompanied by wife, [name]- temp 97.8.)   [name] is now living with them.  Chronic pain due to cervical and lumbar DDD s/p surgeries - previously saw [name] at [facility] in [place] on oxycodone and fentanyl patch 63mg Q48 hours. Oxycodone not filled since 12/2020. [facility] pain clinic is still awaiting records from prior pain clinic before scheduling appointment. We refilled fentanyl #15 patches 02/22/2021, advised he take tylenol 5014mTID scheduled as well.   Do[facility] significantly better [facility] will need lifelong dental ppx.   Cirrhosis - saw [name] last month, note reviewed. Smooth muscle antibody positive with elevated IgG, liver biopsy inconclusive due to burnt out liver - cirrhosis thought 2/2 NASH with ascites. On lasix 409maldactone 100m59mily, working towards getting xifaxan for hepatic encephalopathy symptoms. Planning EGD and RUQ US. [place] has trouble tolerating lactulose.      Relevant past medical, surgical, family and social history reviewed and updated as indicated. Interim medical history since our last visit reviewed. Allergies and medications reviewed and updated. Outpatient Medications Prior to Visit  Medication Sig Dispense Refill   albuterol (PROVENTIL) (2.5 MG/3ML) 0.083% nebulizer solution USE 1 VIAL PER NEBULIZER EVERY 6 HRS AS NEEDED FOR WHEEZING 75 mL 6   albuterol (VENTOLIN HFA) 108 (90 Base) MCG/ACT inhaler Inhale 2 puffs into the lungs every 6 (six) hours as needed for wheezing or shortness of  breath. 1 each 3   aspirin 81 MG tablet Take 1 tablet (81 mg total) by mouth daily.     B Complex-C (SUPER B COMPLEX PO) Take 1 tablet by mouth daily.     Ca Phosphate-Cholecalciferol [phone] MG-UNIT TABS Take 1 tablet by mouth daily.      cholecalciferol 2000 units TABS Take 2,000 Units by mouth daily.     DULoxetine (CYMBALTA) 60 MG capsule Take 1 capsule (60 mg total) by mouth daily. 90 capsule 3   ferrous sulfate 325 (65 FE) MG tablet Take 1 tablet (325 mg total) by mouth every other day.     fluticasone (FLONASE) 50 MCG/ACT nasal spray Place 2 sprays into both nostrils daily as needed for allergies.      folic acid (FOLVITE) 1 MG tablet TAKE 1 TABLET BY MOUTH EVERY DAY 90 tablet 0   furosemide (LASIX) 40 MG tablet Take 1 tablet (40 mg total) by mouth daily. 90 tablet 1   gabapentin (NEURONTIN) 300 MG capsule Take 1 capsule (300 mg total) by mouth 3 (three) times daily.     Lactulose 20 GM/30ML SOLN Take 30 mLs (20 g total) by mouth 3 (three) times daily. 2700 mL 5   methocarbamol (ROBAXIN) 500 MG tablet Take 1 tablet (500 mg total) by mouth at bedtime. 30 tablet 3   metoprolol succinate (TOPROL-XL) 25 MG 24 hr tablet Take 1 tablet (25 mg total) by mouth daily. 90 tablet 3  Misc Natural Products (TART CHERRY ADVANCED) CAPS Take 1 capsule by mouth 2 (two) times daily.     nitroGLYCERIN (NITROSTAT) 0.4 MG SL tablet Place 1 tablet (0.4 mg total) under the tongue every 5 (five) minutes as needed for chest pain. 25 tablet 12   nystatin cream (MYCOSTATIN) APPLY TO AFFECTED AREA TWICE A DAY 75 g 0   omeprazole (PRILOSEC) 40 MG capsule TAKE 1 CAPSULE BY MOUTH EVERY DAY 90 capsule 0   ondansetron (ZOFRAN) 4 MG tablet TAKE 1 TABLET BY MOUTH EVERY 8 HOURS AS NEEDED FOR NAUSEA AND VOMITING 30 tablet 1   Red Yeast Rice 600 MG CAPS Take 2 capsules by mouth daily.     Respiratory Therapy Supplies (NEBULIZER) DEVI Use as instructed for albuterol nebulization, with necessary tubing 1 each 0   rifaximin  (XIFAXAN) 550 MG TABS tablet Take 1 tablet (550 mg total) by mouth 2 (two) times daily. 60 tablet 11   spironolactone (ALDACTONE) 100 MG tablet Take 1 tablet (100 mg total) by mouth daily. 90 tablet 1   vitamin B-12 (CYANOCOBALAMIN) 500 MCG tablet Take 1 tablet (500 mcg total) by mouth every Monday, Wednesday, and Friday.     fentaNYL (DURAGESIC) 75 MCG/HR Place 1 patch onto the skin every other day for 15 doses. 15 patch 0   No facility-administered medications prior to visit.     Per HPI unless specifically indicated in ROS section below Review of Systems  Objective:  BP 134/62   Pulse 74   Temp 97.6 F (36.4 C) (Temporal)   Ht 5' 9"  (1.753 m)   Wt 278 lb (126.1 kg)   SpO2 99%   BMI 41.05 kg/m   Wt Readings from Last 3 Encounters:  03/22/21 278 lb (126.1 kg)  02/21/21 272 lb (123.4 kg)  02/10/21 277 lb (125.6 kg)      Physical Exam Vitals and nursing note reviewed.  Constitutional:      Appearance: Normal appearance. He is obese. He is not ill-appearing.     Comments: Ambulates on his own today. Able to get on exam table on his own - first time in years!  Cardiovascular:     Rate and Rhythm: Normal rate and regular rhythm.     Pulses: Normal pulses.     Heart sounds: Normal heart sounds. No murmur heard. Pulmonary:     Effort: Pulmonary effort is normal. No respiratory distress.     Breath sounds: Normal breath sounds. No wheezing, rhonchi or rales.  Musculoskeletal:     Right lower leg: No edema.     Left lower leg: No edema.  Skin:    General: Skin is warm and dry.  Neurological:     Mental Status: He is alert.  Psychiatric:        Mood and Affect: Mood normal.        Behavior: Behavior normal.      Results for orders placed or performed in visit on 03/22/21  Comprehensive metabolic panel  Result Value Ref Range   Sodium 135 135 - 145 mEq/L   Potassium 4.8 3.5 - 5.1 mEq/L   Chloride 103 96 - 112 mEq/L   CO2 29 19 - 32 mEq/L   Glucose, Bld 110 (H) 70 - 99  mg/dL   BUN 15 6 - 23 mg/dL   Creatinine, Ser 0.91 0.40 - 1.50 mg/dL   Total Bilirubin 1.5 (H) 0.2 - 1.2 mg/dL   Alkaline Phosphatase 173 (H) 39 - 117 U/L  AST 27 0 - 37 U/L   ALT 14 0 - 53 U/L   Total Protein 7.0 6.0 - 8.3 g/dL   Albumin 2.9 (L) 3.5 - 5.2 g/dL   GFR 88.11 >60.00 mL/min   Calcium 8.6 8.4 - 10.5 mg/dL  CBC with Differential/Platelet  Result Value Ref Range   WBC 5.6 4.0 - 10.5 K/uL   RBC 3.36 (L) 4.22 - 5.81 Mil/uL   Hemoglobin 11.8 (L) 13.0 - 17.0 g/dL   HCT 34.8 (L) 39.0 - 52.0 %   MCV 103.5 (H) 78.0 - 100.0 fl   MCHC 33.9 30.0 - 36.0 g/dL   RDW 16.6 (H) 11.5 - 15.5 %   Platelets 45.0 Repeated and verified X2. (LL) 150.0 - 400.0 K/uL   Neutrophils Relative % 67.2 43.0 - 77.0 %   Lymphocytes Relative 18.3 12.0 - 46.0 %   Monocytes Relative 10.7 3.0 - 12.0 %   Eosinophils Relative 3.1 0.0 - 5.0 %   Basophils Relative 0.7 0.0 - 3.0 %   Neutro Abs 3.7 1.4 - 7.7 K/uL   Lymphs Abs 1.0 0.7 - 4.0 K/uL   Monocytes Absolute 0.6 0.1 - 1.0 K/uL   Eosinophils Absolute 0.2 0.0 - 0.7 K/uL   Basophils Absolute 0.0 0.0 - 0.1 K/uL  Protime-INR  Result Value Ref Range   INR 1.3 (H) 0.8 - 1.0 ratio   Prothrombin Time 13.8 (H) 9.6 - 13.1 sec   *Note: Due to a large number of results and/or encounters for the requested time period, some results have not been displayed. A complete set of results can be found in Results Review.    Assessment & Plan:  This visit occurred during the SARS-CoV-2 public health emergency.  Safety protocols were in place, including screening questions prior to the visit, additional usage of staff PPE, and extensive cleaning of exam room while observing appropriate contact time as indicated for disinfecting solutions.   Problem List Items Addressed This Visit     Chronic pain syndrome - Primary (Chronic)    We are filling fentanyl 45mg patch Q48 hours while he establishes with new pain clinic. Continue this with tylenol 5042mTID PRN.  Update UDS  today.  [place] CSRS reviewed.       Relevant Medications   fentaNYL (DURAGESIC) 75 MCG/HR   acetaminophen (TYLENOL) 500 MG tablet   Other Relevant Orders   DRUG MONITORING, PANEL 8 WITH CONFIRMATION, URINE   Cervical spondylosis   Relevant Medications   fentaNYL (DURAGESIC) 75 MCG/HR   acetaminophen (TYLENOL) 500 MG tablet   DDD (degenerative disc disease), cervical   Relevant Medications   fentaNYL (DURAGESIC) 75 MCG/HR   acetaminophen (TYLENOL) 500 MG tablet   Osteoarthritis of right hip   Relevant Medications   fentaNYL (DURAGESIC) 75 MCG/HR   acetaminophen (TYLENOL) 500 MG tablet   Cirrhosis of liver with ascites (HCC)    Likely NASH cirrhosis followed by GI.  Appreciate GI care - working on getting xifaxan through patient assistance for hepatic encephalopathy symptoms. Discussed lactulose dosing.  Remains thrombocytopenic - caution with aspirin use.  MELD-Na score: 11 at 03/22/2021  9:18 AM MELD score: 11 at 03/22/2021  9:18 AM Calculated from: Serum Creatinine: 0.91 mg/dL (Using min of 1 mg/dL) at 03/22/2021  9:12 AM Serum Sodium: 135 mEq/L at 03/22/2021  9:12 AM Total Bilirubin: 1.5 mg/dL at 03/22/2021  9:12 AM INR(ratio): 1.3 ratio at 03/22/2021  9:18 AM Age: [age]       Relevant Orders  Comprehensive metabolic panel (Completed)   CBC with Differential/Platelet (Completed)   Protime-INR (Completed)   Encounter for chronic pain management    Working on establishing with local pain clinic. Previously saw Miguel Sanjuan Dame in [place] then [name] in [facility] pain management.  [place] CSRS reviewed.       Status post right hip replacement    Doing significantly better since hip replacement.  Now ambulatory again.         Meds ordered this encounter  Medications   fentaNYL (DURAGESIC) 75 MCG/HR    Sig: Place 1 patch onto the skin every other day for 15 doses.    Dispense:  15 patch    Refill:  0   acetaminophen (TYLENOL) 500 MG tablet    Sig: Take 1 tablet (500 mg  total) by mouth in the morning, at noon, and at bedtime.   Orders Placed This Encounter  Procedures   Comprehensive metabolic panel   CBC with Differential/Platelet   Protime-INR   DRUG MONITORING, PANEL 8 WITH CONFIRMATION, URINE    Fentanyl     Patient Instructions  Call prior pain clinic to request records be sent to [facility] pain clinic.  Labs today.  Continue current medicines.  Urine drug screen today.  Return in 3 months for follow up visit.   Follow up plan: Return in about 3 months (around 06/22/2021), or if symptoms worsen or fail to improve.  Ria Bush, MD

## 2021-03-24 NOTE — Assessment & Plan Note (Addendum)
Likely NASH cirrhosis followed by GI.  Appreciate GI care - working on getting xifaxan through patient assistance for hepatic encephalopathy symptoms. Discussed lactulose dosing.  Remains thrombocytopenic - caution with aspirin use.  MELD-Na score: 11 at 03/22/2021  9:18 AM MELD score: 11 at 03/22/2021  9:18 AM Calculated from: Serum Creatinine: 0.91 mg/dL (Using min of 1 mg/dL) at 03/22/2021  9:12 AM Serum Sodium: 135 mEq/L at 03/22/2021  9:12 AM Total Bilirubin: 1.5 mg/dL at 03/22/2021  9:12 AM INR(ratio): 1.3 ratio at 03/22/2021  9:18 AM Age: 65 years

## 2021-03-24 NOTE — Assessment & Plan Note (Signed)
Working on establishing with local pain clinic. Previously saw Dr Sanjuan Dame in [place] then Dr Raenette Rover in [facility] pain management.  [place] CSRS reviewed.

## 2021-03-24 NOTE — Assessment & Plan Note (Signed)
Doing significantly better since hip replacement.  Now ambulatory again.

## 2021-03-24 NOTE — Assessment & Plan Note (Deleted)
Continue current regimen of metoprolol XL, lasix, spironolactone. Currently off irbesartan.

## 2021-03-24 NOTE — Assessment & Plan Note (Addendum)
We are filling fentanyl 5mg patch Q48 hours while he establishes with new pain clinic. Continue this with tylenol 5075mTID PRN.  Update UDS today.  [place] CSRS reviewed.

## 2021-03-25 DIAGNOSIS — Z79891 Long term (current) use of opiate analgesic: Secondary | ICD-10-CM | POA: Diagnosis not present

## 2021-03-25 DIAGNOSIS — M25511 Pain in right shoulder: Secondary | ICD-10-CM | POA: Diagnosis not present

## 2021-03-25 DIAGNOSIS — M25551 Pain in right hip: Secondary | ICD-10-CM | POA: Diagnosis not present

## 2021-03-25 DIAGNOSIS — M545 Low back pain, unspecified: Secondary | ICD-10-CM | POA: Diagnosis not present

## 2021-03-25 DIAGNOSIS — G894 Chronic pain syndrome: Secondary | ICD-10-CM | POA: Diagnosis not present

## 2021-03-25 DIAGNOSIS — M542 Cervicalgia: Secondary | ICD-10-CM | POA: Diagnosis not present

## 2021-03-26 LAB — DRUG MONITORING, PANEL 8 WITH CONFIRMATION, URINE
6 Acetylmorphine: NEGATIVE ng/mL (ref <10)
Alcohol Metabolites: NEGATIVE ng/mL (ref <500)
Amphetamines: NEGATIVE ng/mL (ref <500)
Benzodiazepines: NEGATIVE ng/mL (ref <100)
Buprenorphine, Urine: NEGATIVE ng/mL (ref <5)
Cocaine Metabolite: NEGATIVE ng/mL (ref <150)
Creatinine: 127.7 mg/dL (ref >=20.0)
MDMA: NEGATIVE ng/mL (ref <500)
Marijuana Metabolite: 225 ng/mL — ABNORMAL HIGH (ref <5)
Marijuana Metabolite: POSITIVE ng/mL — AB (ref <20)
Opiates: NEGATIVE ng/mL (ref <100)
Oxidant: NEGATIVE ug/mL (ref <200)
Oxycodone: NEGATIVE ng/mL (ref <100)
pH: 8.1 (ref 4.5–9.0)

## 2021-03-26 LAB — DM TEMPLATE

## 2021-04-23 DIAGNOSIS — Z20822 Contact with and (suspected) exposure to covid-19: Secondary | ICD-10-CM | POA: Diagnosis not present

## 2021-04-25 ENCOUNTER — Other Ambulatory Visit: Payer: Self-pay

## 2021-04-25 ENCOUNTER — Telehealth (INDEPENDENT_AMBULATORY_CARE_PROVIDER_SITE_OTHER): Payer: Medicare HMO | Admitting: Family Medicine

## 2021-04-25 ENCOUNTER — Encounter: Payer: Self-pay | Admitting: Family Medicine

## 2021-04-25 ENCOUNTER — Telehealth: Payer: Self-pay

## 2021-04-25 ENCOUNTER — Telehealth: Payer: Self-pay | Admitting: *Deleted

## 2021-04-25 VITALS — BP 134/79 | HR 79 | Temp 98.3°F | Ht 69.0 in

## 2021-04-25 DIAGNOSIS — J449 Chronic obstructive pulmonary disease, unspecified: Secondary | ICD-10-CM

## 2021-04-25 DIAGNOSIS — D696 Thrombocytopenia, unspecified: Secondary | ICD-10-CM

## 2021-04-25 DIAGNOSIS — E440 Moderate protein-calorie malnutrition: Secondary | ICD-10-CM

## 2021-04-25 DIAGNOSIS — U071 COVID-19: Secondary | ICD-10-CM

## 2021-04-25 DIAGNOSIS — Z96641 Presence of right artificial hip joint: Secondary | ICD-10-CM

## 2021-04-25 DIAGNOSIS — G894 Chronic pain syndrome: Secondary | ICD-10-CM

## 2021-04-25 DIAGNOSIS — M1611 Unilateral primary osteoarthritis, right hip: Secondary | ICD-10-CM

## 2021-04-25 DIAGNOSIS — R188 Other ascites: Secondary | ICD-10-CM

## 2021-04-25 DIAGNOSIS — F332 Major depressive disorder, recurrent severe without psychotic features: Secondary | ICD-10-CM

## 2021-04-25 MED ORDER — MOLNUPIRAVIR EUA 200MG CAPSULE: 4.0000 | ORAL_CAPSULE | Freq: Two times a day (BID) | ORAL | 0 refills | Status: AC

## 2021-04-25 MED ORDER — FENTANYL 75 MCG/HR TD PT72: 1.0000 | MEDICATED_PATCH | TRANSDERMAL | 0 refills | Status: DC

## 2021-04-25 NOTE — Telephone Encounter (Signed)
Appreciate Dr Lorelei Pont seeing patient.

## 2021-04-25 NOTE — Telephone Encounter (Signed)
[name] has a virtual visit today with Dr. Lorelei Pont.  While on the phone his wife as for a refill on his Fentanyl Patches.  I do not see this listed on current medication.

## 2021-04-25 NOTE — Telephone Encounter (Signed)
Per appt notes pt already has appt for video visit scheduled with Dr Lorelei Pont 04/25/21 at 9:20. Sending note to Dr Ethelene Browns CMA and Dr Darnell Level as Juluis Rainier to PCP.

## 2021-04-25 NOTE — Telephone Encounter (Signed)
Lvm asking pt's wife, [name] (on dpr), to call back.  Need to relay Dr. Synthia Innocent message and get answer his questions.

## 2021-04-25 NOTE — Telephone Encounter (Signed)
[place] Night - Client TELEPHONE ADVICE RECORD [facility] Patient Name: [name] Gender: Male DOB: Oct 29, 1955 Age: 65 Y 1 M 22 D Return Phone Number: [phone] (Primary) [place]  [address] Client [place] Night - Client Client Site [place] Provider Ria Bush - MD Contact Type Call Who Is Calling Patient / Member / Family / Caregiver Call Type Triage / Clinical Caller Name Mclane Arora Relationship To Patient Spouse Return Phone Number [phone] (Primary) Chief Complaint Diarrhea Reason for Call Symptomatic / Request for [place] states her husband has been exposed to Covid. He has now tested positive today. He has diarrhea, no fever, no vomiting. Would like to know about medication for him. Translation No Nurse Assessment Nurse: Glean Salvo, RN, [place] Date/Time (Eastern Time): 04/24/2021 6:53:19 PM Confirm and document reason for call. If symptomatic, describe symptoms. ---Caller states her husband tested positive for Covid yesterday. He has now tested positive today. He has diarrhea, no fever, no vomiting. Would like to know about medication for him. Cough, Sore throat, Fatigue Does the patient have any new or worsening symptoms? ---Yes Will a triage be completed? ---Yes Related visit to physician within the last 2 weeks? ---No Does the PT have any chronic conditions? (i.e. diabetes, asthma, this includes High risk factors for pregnancy, etc.) ---Yes List chronic conditions. ---Cirrhosis, 5 Stents in the heart, Muscle Disease, Stent in the Stomach Is this a behavioral health or substance abuse call? ---No Guidelines Guideline Title Affirmed Question Affirmed Notes Nurse Date/Time (Eastern Time) COVID-19 - Diagnosed or Suspected MODERATE difficulty breathing (e.g., speaks  in phrases, SOB even at rest, pulse 100-120) Walker-Foster, RN, [place] 04/24/2021 6:58:26 PM PLEASE NOTE: All timestamps contained within this report are represented as [place] Standard Time. CONFIDENTIALTY NOTICE: This fax transmission is intended only for the addressee. It contains information that is legally privileged, confidential or otherwise protected from use or disclosure. If you are not the intended recipient, you are strictly prohibited from reviewing, disclosing, copying using or disseminating any of this information or taking any action in reliance on or regarding this information. If you have received this fax in error, please notify us immediately by telephone so that we can arrange for its return to [place]. Phone: [phone], Toll-Free: [phone], Fax: [phone] Page: 2 of 2 Call Id: [id number] [place]. Time Eilene Ghazi Time) Disposition Final User 04/24/2021 7:06:10 PM Go to ED Now Yes Glean Salvo, RN, Hayden Rasmussen Caller Disagree/Comply Comply Caller Understands Yes PreDisposition Did not know what to do Care Advice Given Per Guideline GO TO ED NOW: * You need to be seen in the Emergency Department. ANOTHER ADULT SHOULD DRIVE: * It is better and safer if another adult drives instead of you. CARE ADVICE given per COVID-19 - DIAGNOSED OR SUSPECTED (Adult) guideline. Referrals GO TO FACILITY UNDECIDE

## 2021-04-25 NOTE — Progress Notes (Signed)
      Synda Bagent T. Kellsey Sansone, MD Primary Care and Sports Medicine [facility] at [facility] [place], [address] Phone: [phone]  FAX: [place] - 65 y.o. male  MRN [id number]  Date of Birth: 12-24-1955  Visit Date: 04/25/2021  PCP: Ria Bush, MD  Referred by: Ria Bush, MD  Virtual Visit via Video Note:  I connected with  [name] on 04/25/2021  9:20 AM EST by a video enabled telemedicine application and verified that I am speaking with the correct person using two identifiers.   Location patient: home computer, tablet, or smartphone Location provider: work or home office Consent: Verbal consent directly obtained from [name]. Persons participating in the virtual visit: patient, provider  I discussed the limitations of evaluation and management by telemedicine and the availability of in person appointments. The patient expressed understanding and agreed to proceed.  Chief Complaint  Patient presents with   Covid Positive    Tested Positive on Saturday at [facility]-Symptoms started on Saturday   Diarrhea   Cough    With chest tightness   Headache    History of Present Illness:  Covid + Wife has Covid, too.  Saturday tested positive.  ST ? Fever Body aches all over + D  Is a pleasant gentleman and he has multiple different cardiac risk factors including coronary disease, cirrhosis, COPD.  History of MI, morbid obesity, hypertension, hyperlipidemia.  Is not eating all that well, feels poorly, diarrhea, cough, headache.  Polyarthralgias over and above his baseline pain.  Review of Systems as above: See pertinent positives and pertinent negatives per HPI No acute distress verbally   Observations/Objective/Exam:  An attempt was made to discern vital signs over the phone and per patient if applicable and possible.   General:    Alert, Oriented, appears well and in no acute  distress  Pulmonary:     On inspection no signs of respiratory distress.  Psych / Neurological:     Pleasant and cooperative.  Assessment and Plan:    ICD-10-CM   1. COVID-19  U07.1     2. Chronic pain syndrome  G89.4      Multiple risk factors.  Reviewed supportive care, and he definitely needs to be on a antivirals.  Isolation reviewed.  He also needs a refill on his fentanyl pain medication, and I am going to send a carbon copy of this note to Dr. Danise Mina.  I discussed the assessment and treatment plan with the patient. The patient was provided an opportunity to ask questions and all were answered. The patient agreed with the plan and demonstrated an understanding of the instructions.   The patient was advised to call back or seek an in-person evaluation if the symptoms worsen or if the condition fails to improve as anticipated.  Follow-up: prn unless noted otherwise below No follow-ups on file.  Meds ordered this encounter  Medications   molnupiravir EUA (LAGEVRIO) 200 mg CAPS capsule    Sig: Take 4 capsules (800 mg total) by mouth 2 (two) times daily for 5 days.    Dispense:  40 capsule    Refill:  0    No orders of the defined types were placed in this encounter.   Signed,  Maud Deed. Bellamie Turney, MD

## 2021-04-25 NOTE — Telephone Encounter (Signed)
Fentanyl patches refilled.  Also plz call wife as per our last discussion - how is mood, has he restarted taking medications? If ongoing depression would offer changing cymbalta or adding 2nd mood medication if pt willing to take.

## 2021-04-26 ENCOUNTER — Other Ambulatory Visit: Payer: Self-pay | Admitting: Family Medicine

## 2021-04-26 NOTE — Telephone Encounter (Signed)
Lvm asking pt's wife, [name] (on dpr), to call back.  Need to relay Dr. Synthia Innocent message and get answer his questions.

## 2021-04-26 NOTE — Progress Notes (Signed)
HPI: FU coronary artery disease, abdominal aortic aneurysm, hypertension, DM, hyperlipidemia, COPD, cirrhosis, tobacco abuse. He has had prior inferior infarct with PCI of his right coronary artery and PDA. His most recent cardiac catheterization was performed on March 30, 2005. The patient had an ejection fraction of 40% with inferior akinesis at that time. However, his stents in the right coronary artery were widely patent. Carotid Dopplers April 2016 showed no significant stenosis.  CTA July 2019 showed prior endovascular repair of suprarenal abdominal aortic aneurysm without complication.  Renal artery stents were widely patent.  There was note of hepatic cirrhosis and portal venous hypertension with varices around the gastroesophageal junction as well as splenomegaly. Echocardiogram May 2020 showed ejection fraction 35 to 40%, moderate left ventricular enlargement, mild left ventricular hypertrophy, mild right ventricular enlargement. Nuclear study December 2020 showed ejection fraction 43%, prior inferior/inferoseptal infarct with insignificant peri-infarct ischemia. Abdominal ultrasound 9/22 showed patent endovascular aneurysm repair with no leak.  Since last seen he has some chest tightness.  It is in the left upper chest without radiation or associated symptoms.  Occurs both with exertion and at rest.  Last 20 minutes and resolve spontaneously.  Some increase with certain movements.  He notes some dyspnea on exertion.  No orthopnea, PND or pedal edema.  No syncope.  Current Outpatient Medications  Medication Sig Dispense Refill   acetaminophen (TYLENOL) 500 MG tablet Take 1 tablet (500 mg total) by mouth in the morning, at noon, and at bedtime.     albuterol (PROVENTIL) (2.5 MG/3ML) 0.083% nebulizer solution USE 1 VIAL PER NEBULIZER EVERY 6 HRS AS NEEDED FOR WHEEZING 75 mL 6   aspirin 81 MG tablet Take 1 tablet (81 mg total) by mouth daily.     B Complex-C (SUPER B COMPLEX PO) Take 1  tablet by mouth daily.     Ca Phosphate-Cholecalciferol [phone] MG-UNIT TABS Take 1 tablet by mouth daily.      cholecalciferol 2000 units TABS Take 2,000 Units by mouth daily.     DULoxetine (CYMBALTA) 60 MG capsule Take 1 capsule (60 mg total) by mouth daily. 90 capsule 3   fentaNYL (DURAGESIC) 75 MCG/HR Place 1 patch onto the skin every other day for 15 doses. 15 patch 0   ferrous sulfate 325 (65 FE) MG tablet Take 1 tablet (325 mg total) by mouth every other day.     fluticasone (FLONASE) 50 MCG/ACT nasal spray Place 2 sprays into both nostrils daily as needed for allergies.      folic acid (FOLVITE) 1 MG tablet TAKE 1 TABLET BY MOUTH EVERY DAY 90 tablet 0   furosemide (LASIX) 40 MG tablet Take 1 tablet (40 mg total) by mouth daily. 90 tablet 1   gabapentin (NEURONTIN) 300 MG capsule Take 1 capsule (300 mg total) by mouth 3 (three) times daily. 90 capsule 3   Lactulose 20 GM/30ML SOLN Take 30 mLs (20 g total) by mouth 3 (three) times daily. 2700 mL 5   methocarbamol (ROBAXIN) 500 MG tablet TAKE 1 TABLET BY MOUTH AT BEDTIME. 30 tablet 3   metoprolol succinate (TOPROL-XL) 25 MG 24 hr tablet Take 1 tablet (25 mg total) by mouth daily. 90 tablet 3   Misc Natural Products (TART CHERRY ADVANCED) CAPS Take 1 capsule by mouth 2 (two) times daily.     nitroGLYCERIN (NITROSTAT) 0.4 MG SL tablet Place 1 tablet (0.4 mg total) under the tongue every 5 (five) minutes as needed for chest pain. 25  tablet 12   nystatin cream (MYCOSTATIN) APPLY TO AFFECTED AREA TWICE A DAY 75 g 0   omeprazole (PRILOSEC) 40 MG capsule TAKE 1 CAPSULE BY MOUTH EVERY DAY 90 capsule 0   ondansetron (ZOFRAN) 4 MG tablet TAKE 1 TABLET BY MOUTH EVERY 8 HOURS AS NEEDED FOR NAUSEA AND VOMITING 30 tablet 1   Red Yeast Rice 600 MG CAPS Take 2 capsules by mouth daily.     Respiratory Therapy Supplies (NEBULIZER) DEVI Use as instructed for albuterol nebulization, with necessary tubing 1 each 0   rifaximin (XIFAXAN) 550 MG TABS tablet Take  1 tablet (550 mg total) by mouth 2 (two) times daily. 60 tablet 11   spironolactone (ALDACTONE) 100 MG tablet Take 1 tablet (100 mg total) by mouth daily. 90 tablet 1   vitamin B-12 (CYANOCOBALAMIN) 500 MCG tablet Take 1 tablet (500 mcg total) by mouth every Monday, Wednesday, and Friday.     No current facility-administered medications for this visit.     Past Medical History:  Diagnosis Date   AAA (abdominal aortic aneurysm) 09/2012--  monitored by dr Trula Slade   stable 5.6cm CTA abdomen 2016   Abnormal drug screen 07/09/2016   1/2/018 - positive oxycodone, fentanyl, inapprop positive MJ - mod risk   Allergic rhinitis    Ascites 03/2019   B12 deficiency    CAD (coronary artery disease) cardiologist-  dr Stanford Breed   x3 with stents last 2005, EF 40%, predominantly RCA by CT 2016   Cataracts, bilateral    Cervical spondylosis 05/2010   s/p surgery   Charcot Lelan Pons Tooth muscular atrophy dx  1975   neurologist--  dr love--  type 2 per pt   Chronic pain syndrome    [facility] with [facility] --> disagreement and transfered care to Dr Sanjuan Dame at [facility], requests PCP write Rx but f/u with pain clinic Q6-12 months   COPD (chronic obstructive pulmonary disease) ([place]) 10/2011   minimal by PFTs   DDD (degenerative disc disease)    Disturbances of sensation of smell and taste    improving   Dyspnea on exertion    GERD (gastroesophageal reflux disease)    Gout    Headache    Hepatitis    hepatitis B   Hidradenitis    right groin   Hidradenitis suppurativa dx 2011   goin and leg crease   followd by Lyndle Herrlich - daily bactrim, s/p intralesional steroid injection 10/2010   Hip osteoarthritis    s/p intraarticular steroid shot (12/2012) ([place])   History of hepatitis B 1983   History of MI (myocardial infarction)    2000  &  2005   History of pneumonia    History of viral meningitis 2000   HLD (hyperlipidemia)    HTN (hypertension)     Ischemic cardiomyopathy    s/p inferior MI  --  current ef per myoview 39%   Liver cirrhosis secondary to NASH ([place]) 01/2014   by CT scan, rec virtual colonoscopy by Dr Collene Mares 06/2014   Lumbar herniated disc    Myocardial infarction ([place])    x2   Nocturia more than twice per night    Obesity    Spinal stenosis    released from [place]  established with [facility] (07/2013)   T2DM (type 2 diabetes mellitus) ([place])    ABIs WNL 2016   Vitamin D deficiency     Past Surgical History:  Procedure Laterality Date   ABDOMINAL AORTIC  ENDOVASCULAR FENESTRATED STENT GRAFT N/A 11/30/2015   Procedure: ABDOMINAL AORTIC ENDOVASCULAR FENESTRATED STENT GRAFT;  Surgeon: Serafina Mitchell, MD;  Location: [place];  Service: Vascular;  Laterality: N/A;   ANTERIOR CERVICAL DECOMP/DISCECTOMY FUSION  01-07-2010    C4 -- C7   CARDIAC CATHETERIZATION  03-30-2005  dr Albertine Patricia   ef 40% w/ inferior akinesis/  LM and CFX angiographically normal/  pLAD 30%/   Widely patent stents in RCA and PDA widely patent   CARDIOVASCULAR STRESS TEST  10-23-2012  dr Stanford Breed   No ischemia/  Moderate scar in the inferior wall, otherwise normal perfusion/  LV ef 39%,  LV wall motion: inferior/ inferolateral hypokinesis   COLONOSCOPY  05/06/2007   normal, small int hemorrhoids rpt 5 yrs due to fmhx - rec against rpt colonoscopy by Dr Collene Mares   CORONARY ANGIOPLASTY  2000  dr Stanford Breed   PCI to RCA and PDA   CORONARY ANGIOPLASTY WITH STENT PLACEMENT  03-19-2005  dr Gwyndolyn Saxon downey   inferior STEMI--- DES x4 to RCA w/ balloon angioplasty and balloon angioplasty to jailed PDA ostium/  severe hypokinesis of midinferor wall, ef 50%/  dLM 20%,  mLAD 20%,  dCFX 60%   ESOPHAGOGASTRODUODENOSCOPY  01/2017   dilated benign esophageal stenosis, portal hypertensive gastropathy Henrene Pastor)   ESOPHAGOGASTRODUODENOSCOPY (EGD) WITH PROPOFOL N/A 02/20/2018   benign biopsy Vicente Males, Bailey Mech, MD)   ESOPHAGOGASTRODUODENOSCOPY (EGD) WITH PROPOFOL N/A 05/12/2019   Procedure:  ESOPHAGOGASTRODUODENOSCOPY (EGD) WITH PROPOFOL;  Surgeon: [name], MD;  Location[facility] ENDOSCOPY;  Service: Gastroenterology;  Laterality: N/A;   HYDRADENITIS EXCISION Right 12/31/2014   Procedure: WIDE EXCISION HIDRADENITIS GROIN; Coralie Keens, MD   IR PARACENTESIS  03/25/2019   LUMBAR DISC SURGERY     L5-S1   LUMBAR LAMINECTOMY  05-18-2010   L2--5   laminectomy/foraminotomy for stenosis ([place])   MYELOGRAM     L5-S1 and L1-2 spondylosis   SACROILIAC JOINT INJECTION Bilateral 10/2013   [place]   TONSILLECTOMY AND ADENOIDECTOMY  1972    Social History   Socioeconomic History   Marital status: Married    Spouse name: [name]   Number of children: 0   Years of education: [employer] education level: Not on file  Occupational History   Occupation: Neurosurgeon implants, crown and bridge-now disability 2006    Employer: UNEMPLOYED  Tobacco Use   Smoking status: Every Day    Packs/day: 1.00    Years: 57.00    Pack years: 57.00    Types: Cigarettes, E-cigarettes    Start date: 06/06/1967    Last attempt to quit: 07/21/2019    Years since quitting: 1.7   Smokeless tobacco: Never   Tobacco comments:    stopped smoking a pipe in 2015  DOES SMOKE E CIG  Vaping Use   Vaping Use: Former  Substance and Sexual Activity   Alcohol use: No    Alcohol/week: 0.0 standard drinks   Drug use: Yes    Types: Fentanyl, Hydrocodone   Sexual activity: Not on file  Other Topics Concern   Not on file  Social History Narrative   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 x 5 years   caffeine: 2 cups coffee, 2 cups soda   Occupation: Neurosurgeon implants, crowne and bridge, now disability 2006   Lives with wife, 1 dog, no children   Social Determinants of Health   Financial Resource Strain: High Risk   Difficulty of Paying Living Expenses: Hard  Food Insecurity: Not on file  Transportation Needs:  Not on file  Physical Activity: Not on file  Stress: Not on file  Social  Connections: Not on file  Intimate Partner Violence: Not on file    Family History  Problem Relation Age of Onset   Cancer Mother        colon   Diabetes Mother    Kidney disease Mother    Aneurysm Mother        AAA   Rheum arthritis Mother    Charcot-Marie-Tooth disease Mother    Heart disease Mother        before age 48   Cancer Father        skin   Heart attack Father    Heart disease Father        before age 78   Cancer Brother        skin   Coronary artery disease Brother    Cancer Brother        small cell lung cancer   Aneurysm Brother        AAA   Rheum arthritis Sister    Rheum arthritis Brother    Prostate cancer Neg Hx    Bladder Cancer Neg Hx    Kidney cancer Neg Hx     ROS: Chronic pain but no fevers or chills, productive cough, hemoptysis, dysphasia, odynophagia, melena, hematochezia, dysuria, hematuria, rash, seizure activity, orthopnea, PND, pedal edema, claudication. Remaining systems are negative.  Physical Exam: Well-developed obese in no acute distress.  Skin is warm and dry.  HEENT is normal.  Neck is supple.  Chest is clear to auscultation with normal expansion.  Cardiovascular exam is regular rate and rhythm.  Abdominal exam nontender or distended. No masses palpated. Extremities show no edema. neuro grossly intact  ECG-normal sinus rhythm at a rate of 64, nonspecific ST changes.  Personally reviewed  A/P  1 CAD-continue ASA; intolerant to statins.  2 Ischemic CM-continue beta-blocker.  Add losartan 25 mg daily.  Check potassium and renal function in 1 week.  3 hypertension-patient's blood pressure is controlled.  Continue present medications and follow.  4 hyperlipidemia-patient is intolerant to statins.  Continue Zetia.  5 tobacco abuse-patient counseled on discontinuing.  6 previous abdominal aortic aneurysm repair-patient is followed by vascular surgery.  7 cirrhosis-Per gastroenterology.  8 chest pain-patient has vague  chest tightness which has been a chronic issue.  His electrocardiogram shows no new ST changes.  Most recent nuclear study was low risk.  We will follow for now.  Kirk Ruths, MD

## 2021-04-27 ENCOUNTER — Other Ambulatory Visit: Payer: Self-pay | Admitting: Family Medicine

## 2021-04-27 NOTE — Telephone Encounter (Signed)
Gabapentin Last rx:  12/03/20 Last OV:  03/22/21, chronic pan f/u Next OV:  06/22/21, 3 mo f/u

## 2021-04-27 NOTE — Telephone Encounter (Addendum)
I've not prescribed this in the past.  Who's previously prescribed this medication and how is he taking it?

## 2021-04-27 NOTE — Telephone Encounter (Signed)
Pt wife returning your call.

## 2021-04-27 NOTE — Telephone Encounter (Signed)
  Encourage patient to contact the pharmacy for refills or they can request refills through [place]:  Please schedule appointment if longer than 1 year  NEXT APPOINTMENT DATE:06/22/21  MEDICATION:gabapentin (NEURONTIN) 300 MG capsule  Is the patient out of medication?   [facility]ARMACY[id number] [place] [facility] [address] Let patient know to contact pharmacy at the end of the day to make sure medication is ready.  Please notify patient to allow 48-72 hours to process  CLINICAL FILLS OUT ALL BELOW:   LAST REFILL:  QTY:  REFILL DATE:    OTHER COMMENTS:    Okay for refill?  Please advise

## 2021-04-27 NOTE — Telephone Encounter (Signed)
Lvm asking pt's wife, [name] (on dpr), to call back.  Need to relay Dr. Synthia Innocent message and get answer his questions.

## 2021-04-27 NOTE — Addendum Note (Signed)
Addended by: Brenton Grills on: 68/34/1962 02:25 PM   Modules accepted: Orders

## 2021-05-02 NOTE — Telephone Encounter (Signed)
Plz notify I've placed referral for home health to come out to the house including social worker.

## 2021-05-02 NOTE — Addendum Note (Signed)
Addended by: Ria Bush on: 05/02/2021 01:25 PM   Modules accepted: Orders

## 2021-05-02 NOTE — Telephone Encounter (Addendum)
Spoke with pt's wife relaying Dr. Synthia Innocent message.  Verbalizes understanding and expresses her thanks.

## 2021-05-02 NOTE — Telephone Encounter (Signed)
Called spoke to patient wife. On DPR. Was seen by Dr. Raenette Rover? At [facility]. She thinks he has been taking 323m tid. Would like refill sent if for that.

## 2021-05-02 NOTE — Telephone Encounter (Signed)
Spoke with pt's wife, [name], asking about pt's mood and meds.  States she cannot get pt to take his to his medications.  Says she can't get him to do anything.  States pt only wants to stay in bed. Won't even let her bathe him.  She's asking if they can get someone to come in to help.  But also, states that he still may not take meds or let anyone help him.  Says she desperate.  Plz advise.

## 2021-05-02 NOTE — Telephone Encounter (Signed)
Pt wife called back, she only wanted to speak to [facility]

## 2021-05-03 MED ORDER — GABAPENTIN 300 MG PO CAPS
300.0000 mg | ORAL_CAPSULE | Freq: Three times a day (TID) | ORAL | 3 refills | Status: DC
Start: 2021-05-03 — End: 2021-06-27

## 2021-05-03 NOTE — Telephone Encounter (Signed)
ERx 

## 2021-05-05 ENCOUNTER — Encounter: Payer: Self-pay | Admitting: Cardiology

## 2021-05-05 ENCOUNTER — Ambulatory Visit: Payer: Medicare HMO | Admitting: Cardiology

## 2021-05-05 ENCOUNTER — Other Ambulatory Visit: Payer: Self-pay

## 2021-05-05 VITALS — BP 100/60 | HR 70 | Ht 69.0 in | Wt 268.0 lb

## 2021-05-05 DIAGNOSIS — E7800 Pure hypercholesterolemia, unspecified: Secondary | ICD-10-CM | POA: Diagnosis not present

## 2021-05-05 DIAGNOSIS — I2510 Atherosclerotic heart disease of native coronary artery without angina pectoris: Secondary | ICD-10-CM | POA: Diagnosis not present

## 2021-05-05 DIAGNOSIS — R072 Precordial pain: Secondary | ICD-10-CM

## 2021-05-05 DIAGNOSIS — I10 Essential (primary) hypertension: Secondary | ICD-10-CM

## 2021-05-05 MED ORDER — LOSARTAN POTASSIUM 25 MG PO TABS: 25.0000 mg | ORAL_TABLET | Freq: Every day | ORAL | 3 refills | Status: DC

## 2021-05-05 NOTE — Patient Instructions (Signed)
Medication Instructions:   START LOSARTAN 25 MG ONCE DAILY  *If you need a refill on your cardiac medications before your next appointment, please call your pharmacy*   Lab Work:  Your physician recommends that you return for lab work in: [place]  If you have labs (blood work) drawn today and your tests are completely normal, you will receive your results only by: [place] (if you have MyChart) OR A paper copy in the mail If you have any lab test that is abnormal or we need to change your treatment, we will call you to review the results.   Follow-Up: At [facility], you and your health needs are our priority.  As part of our continuing mission to provide you with exceptional heart care, we have created designated Provider Care Teams.  These Care Teams include your primary Cardiologist (physician) and Advanced Practice Providers (APPs -  Physician Assistants and Nurse Practitioners) who all work together to provide you with the care you need, when you need it.  We recommend signing up for the patient portal called "MyChart".  Sign up information is provided on this After Visit Summary.  MyChart is used to connect with patients for Virtual Visits (Telemedicine).  Patients are able to view lab/test results, encounter notes, upcoming appointments, etc.  Non-urgent messages can be sent to your provider as well.   To learn more about what you can do with MyChart, go to [email or web address].    Your next appointment:   6 month(s)  The format for your next appointment:   In Person  Provider:   Kirk Ruths MD

## 2021-05-12 ENCOUNTER — Telehealth: Payer: Self-pay | Admitting: Family Medicine

## 2021-05-12 NOTE — Telephone Encounter (Addendum)
Liver disease can definitely affect the brain - something called called hepatic encephalopathy. However, don't recommend brain imaging as it wouldn't necessarily show these changes on MRI.  Would emphasize he needs to take his lactulose to help prevent this.  If he has more questions, recommend he make office visit to discuss.

## 2021-05-12 NOTE — Telephone Encounter (Signed)
Pt wife called asking if Dr Darnell Level would order pt a MRI on pt brain. Pt wife would also like a call from [place].

## 2021-05-12 NOTE — Telephone Encounter (Signed)
Spoke with pt's wife, [name] (on dpr), asking about request.  States pt does not believe cirrhosis of the liver affects the brain so she's requesting an MRI.    Also, [name] wants to make Dr. Darnell Level aware Arvin Collard, medical social worker with [facility], was just to visit pt.

## 2021-05-13 NOTE — Telephone Encounter (Signed)
Spoke with pt's wife, [name], relaying Dr. Synthia Innocent message.  She verbalizes understanding, expresses her thanks and will encourage pt to take lactulose.

## 2021-05-17 DIAGNOSIS — H9190 Unspecified hearing loss, unspecified ear: Secondary | ICD-10-CM

## 2021-05-17 DIAGNOSIS — E1142 Type 2 diabetes mellitus with diabetic polyneuropathy: Secondary | ICD-10-CM | POA: Diagnosis not present

## 2021-05-17 DIAGNOSIS — K219 Gastro-esophageal reflux disease without esophagitis: Secondary | ICD-10-CM

## 2021-05-17 DIAGNOSIS — E440 Moderate protein-calorie malnutrition: Secondary | ICD-10-CM

## 2021-05-17 DIAGNOSIS — Z951 Presence of aortocoronary bypass graft: Secondary | ICD-10-CM

## 2021-05-17 DIAGNOSIS — Z9181 History of falling: Secondary | ICD-10-CM

## 2021-05-17 DIAGNOSIS — Z7982 Long term (current) use of aspirin: Secondary | ICD-10-CM

## 2021-05-17 DIAGNOSIS — E559 Vitamin D deficiency, unspecified: Secondary | ICD-10-CM

## 2021-05-17 DIAGNOSIS — I951 Orthostatic hypotension: Secondary | ICD-10-CM | POA: Diagnosis not present

## 2021-05-17 DIAGNOSIS — G894 Chronic pain syndrome: Secondary | ICD-10-CM

## 2021-05-17 DIAGNOSIS — I119 Hypertensive heart disease without heart failure: Secondary | ICD-10-CM | POA: Diagnosis not present

## 2021-05-17 DIAGNOSIS — I714 Abdominal aortic aneurysm, without rupture, unspecified: Secondary | ICD-10-CM | POA: Diagnosis not present

## 2021-05-17 DIAGNOSIS — M5126 Other intervertebral disc displacement, lumbar region: Secondary | ICD-10-CM | POA: Diagnosis not present

## 2021-05-17 DIAGNOSIS — Z981 Arthrodesis status: Secondary | ICD-10-CM

## 2021-05-17 DIAGNOSIS — I255 Ischemic cardiomyopathy: Secondary | ICD-10-CM | POA: Diagnosis not present

## 2021-05-17 DIAGNOSIS — Z87891 Personal history of nicotine dependence: Secondary | ICD-10-CM

## 2021-05-17 DIAGNOSIS — Z8701 Personal history of pneumonia (recurrent): Secondary | ICD-10-CM

## 2021-05-17 DIAGNOSIS — G4700 Insomnia, unspecified: Secondary | ICD-10-CM | POA: Diagnosis not present

## 2021-05-17 DIAGNOSIS — Z96641 Presence of right artificial hip joint: Secondary | ICD-10-CM

## 2021-05-17 DIAGNOSIS — I2510 Atherosclerotic heart disease of native coronary artery without angina pectoris: Secondary | ICD-10-CM | POA: Diagnosis not present

## 2021-05-17 DIAGNOSIS — G9332 Myalgic encephalomyelitis/chronic fatigue syndrome: Secondary | ICD-10-CM

## 2021-05-17 DIAGNOSIS — K7460 Unspecified cirrhosis of liver: Secondary | ICD-10-CM

## 2021-05-17 DIAGNOSIS — E7800 Pure hypercholesterolemia, unspecified: Secondary | ICD-10-CM

## 2021-05-17 DIAGNOSIS — J309 Allergic rhinitis, unspecified: Secondary | ICD-10-CM

## 2021-05-17 DIAGNOSIS — I252 Old myocardial infarction: Secondary | ICD-10-CM

## 2021-05-17 DIAGNOSIS — J449 Chronic obstructive pulmonary disease, unspecified: Secondary | ICD-10-CM | POA: Diagnosis not present

## 2021-05-17 DIAGNOSIS — G600 Hereditary motor and sensory neuropathy: Secondary | ICD-10-CM

## 2021-05-17 DIAGNOSIS — M109 Gout, unspecified: Secondary | ICD-10-CM

## 2021-05-17 DIAGNOSIS — F332 Major depressive disorder, recurrent severe without psychotic features: Secondary | ICD-10-CM

## 2021-05-23 ENCOUNTER — Telehealth: Payer: Self-pay | Admitting: Family Medicine

## 2021-05-23 NOTE — Telephone Encounter (Signed)
Spoke with Sonia Baller informing her Dr. Darnell Level is giving verbal orders for services requested.

## 2021-05-23 NOTE — Telephone Encounter (Signed)
Sonia Baller from [facility] home health has requested verbal orders for speech therapy  1 time a week for 5 weeks   Sonia Baller: [phone] (okay to leave vm)

## 2021-05-23 NOTE — Telephone Encounter (Signed)
Agree with verbal orders for ST once weekly x 5 wks

## 2021-05-24 ENCOUNTER — Other Ambulatory Visit: Payer: Self-pay | Admitting: Family Medicine

## 2021-05-24 ENCOUNTER — Telehealth: Payer: Self-pay

## 2021-05-24 NOTE — Telephone Encounter (Signed)
Name of Medication: Fentanyl Name of [facility] Last Fill or Written Date and Quantity: 04/25/21, #15 patch Last Office Visit and Type: 03/22/21, 3 mo f/u Next Office Visit and Type: 06/22/21, 3 mo f/u Last Controlled Substance Agreement Date: 03/22/21 Last UDS: 03/22/21

## 2021-05-24 NOTE — Telephone Encounter (Signed)
Pt needs refills on fentaNYL (DURAGESIC) 75 MCG/HR [facility]

## 2021-05-24 NOTE — Progress Notes (Addendum)
Chronic Care Management Pharmacy Assistant   Name: CAPRICE WASKO  MRN: [id number] DOB: [birth year]  Reason for Encounter: CCM (General Adherence)   Recent office visits:  05/23/2021 - Ria Bush, MD - Telephone - Patient is to start speech therapy once weekly for 5 weeks.  05/12/2021 - Ria Bush, MD - Patient Message - Patient's wife is requesting a MRI on the brain as patient does not believe cirrhosis of the liver affects the brain. Dr. Danise Mina notes "Liver disease can definitely affect the brain - something called called hepatic encephalopathy. However, don't recommend brain imaging as it wouldn't necessarily show these changes on MRI." Emphasizing taking his Lactulose to help with brain function. No other information.  03/22/2021 - Ria Bush, MD - Telephone - CRITICAL LAB: Platelet count is 45. Dr. Danise Mina noted: chronic issue due to liver disease.  03/22/2021 - Ria Bush, MD - Patient presented for chronic pain syndrome. Labs: CBC, CMP, Drug monitoring panel 8, Protime-INR and DM template. While patient establishes with new pain clinic continue Fentanyl 75 mcg patch Q 48 hours with Tylenol 500 mg TID PRN. Start: acetaminophen (TYLENOL) 500 MG tablet.  Recent consult visits:  05/05/2021 - Cardiology - Patient presented for follow up for coronary artery disease. Labs: BMP and EKG. Start: Losartan 25 mg daily.  04/25/2021 - Owens Loffler, MD - Video Visit - Patient presented for Covid positive. Start: molnupiravir EUA (LAGEVRIO) 200 mg CAPS capsule for 5 days.  [facility] - Patient tested positive for Covid-19.   Hospital visits:  None in previous 6 months  Medications: Outpatient Encounter Medications as of 05/24/2021  Medication Sig   acetaminophen (TYLENOL) 500 MG tablet Take 1 tablet (500 mg total) by mouth in the morning, at noon, and at bedtime.   albuterol (PROVENTIL) (2.5 MG/3ML) 0.083% nebulizer solution USE 1  VIAL PER NEBULIZER EVERY 6 HRS AS NEEDED FOR WHEEZING   aspirin 81 MG tablet Take 1 tablet (81 mg total) by mouth daily.   B Complex-C (SUPER B COMPLEX PO) Take 1 tablet by mouth daily.   Ca Phosphate-Cholecalciferol 4453580359 MG-UNIT TABS Take 1 tablet by mouth daily.    cholecalciferol 2000 units TABS Take 2,000 Units by mouth daily.   DULoxetine (CYMBALTA) 60 MG capsule Take 1 capsule (60 mg total) by mouth daily.   fentaNYL (DURAGESIC) 75 MCG/HR Place 1 patch onto the skin every other day for 15 doses.   ferrous sulfate 325 (65 FE) MG tablet Take 1 tablet (325 mg total) by mouth every other day.   fluticasone (FLONASE) 50 MCG/ACT nasal spray Place 2 sprays into both nostrils daily as needed for allergies.    folic acid (FOLVITE) 1 MG tablet TAKE 1 TABLET BY MOUTH EVERY DAY   furosemide (LASIX) 40 MG tablet Take 1 tablet (40 mg total) by mouth daily.   gabapentin (NEURONTIN) 300 MG capsule Take 1 capsule (300 mg total) by mouth 3 (three) times daily.   Lactulose 20 GM/30ML SOLN Take 30 mLs (20 g total) by mouth 3 (three) times daily.   losartan (COZAAR) 25 MG tablet Take 1 tablet (25 mg total) by mouth daily.   methocarbamol (ROBAXIN) 500 MG tablet TAKE 1 TABLET BY MOUTH AT BEDTIME.   metoprolol succinate (TOPROL-XL) 25 MG 24 hr tablet Take 1 tablet (25 mg total) by mouth daily.   Misc Natural Products (TART CHERRY ADVANCED) CAPS Take 1 capsule by mouth 2 (two) times daily.   nitroGLYCERIN (NITROSTAT) 0.4 MG  SL tablet Place 1 tablet (0.4 mg total) under the tongue every 5 (five) minutes as needed for chest pain.   nystatin cream (MYCOSTATIN) APPLY TO AFFECTED AREA TWICE A DAY   omeprazole (PRILOSEC) 40 MG capsule TAKE 1 CAPSULE BY MOUTH EVERY DAY   ondansetron (ZOFRAN) 4 MG tablet TAKE 1 TABLET BY MOUTH EVERY 8 HOURS AS NEEDED FOR NAUSEA AND VOMITING   Red Yeast Rice 600 MG CAPS Take 2 capsules by mouth daily.   Respiratory Therapy Supplies (NEBULIZER) DEVI Use as instructed for albuterol  nebulization, with necessary tubing   rifaximin (XIFAXAN) 550 MG TABS tablet Take 1 tablet (550 mg total) by mouth 2 (two) times daily.   spironolactone (ALDACTONE) 100 MG tablet Take 1 tablet (100 mg total) by mouth daily.   vitamin B-12 (CYANOCOBALAMIN) 500 MCG tablet Take 1 tablet (500 mcg total) by mouth every Monday, Wednesday, and Friday.   No facility-administered encounter medications on file as of 05/24/2021.   Contacted [facility] WHISENANT on 12/202/2022 for general disease state and medication adherence call. Spoke with [name] (patient's wife).   Patient is not > 5 days past due for refill on the following medications per chart history:  [facility] Medications: Medication Name/mg Last Fill Days Supply Losartan 25 mg  05/05/2021 90   [facility]   What concerns do you have about your medications? Spoke with patient's wife. She stated that patient previously did not want to take any medications. Patient's wife has since gotten the patient to resume taking his medications. Patient is taking all medications as direct except for his Lactulose. Patient's wife can only get him to take it 1 time a day instead of three times a day as directed.   The patient denies side effects with his medications.   How often do you forget or accidentally miss a dose? Never since patient has resumed taking medications.   Do you use a pillbox? Yes  Are you having any problems getting your medications from your pharmacy? No  Has the cost of your medications been a concern? No  Since last visit with CPP, the following interventions have been made:  Patient is to start speech therapy once weekly for 5 weeks.  Start: Losartan 25 mg daily.  Emphasizing taking his Lactulose to help with brain function. No other information.   The patient has not had an ED visit since last contact.   The patient reports the following problems with their health. Patient's wife states the patient will not do  anything. He sleeps majority of the day. Patient is shutting everyone out. Patient will not listen to his wife and she can not make him do anything he feels he does not want to do.   he reports the following  concerns or questions for Debbora Dus, Pharm. D at this time.  Patient's wife states the patient will run out of Fentanyl tomorrow. Patient was going to pain management every two months, but has been released due to patient taking his wife's Vicodin prescription. Wife is asking for Dr. Danise Mina to call the patient in more Fentanyl. Patient's wife states she does not know if the patient will be able to come in an urinate. He has issues when forced to go. Wife has asked Angie (the home health nurse) to place a catheter in the patient for urine samples. Wife has asked for Fentanyl to be called in to [place], [place]. Phone: [phone] and Fax: [phone]  Care  Gaps: Annual wellness visit in last year? Yes 08/03/2020 Most Recent BP reading: 100/60 on 05/05/2021  While on the phone with patient's wife I made a follow-up appointment with Debbora Dus on 07/26/2021 at 12:00 pm.  PCP appointment on 06/12/2021  Debbora Dus, CPP notified  Marijean Niemann, [place] Assistant [phone]   I have reviewed the care management and care coordination activities outlined in this encounter and I am certifying that I agree with the content of this note. Patient already called office for Fentanyl refill, see telephone note. No further action needed.  Debbora Dus, PharmD Clinical Pharmacist [facility] [phone]

## 2021-05-24 NOTE — Addendum Note (Signed)
Addended by: Brenton Grills on: 23/46/8873 04:11 PM   Modules accepted: Orders

## 2021-05-25 ENCOUNTER — Ambulatory Visit: Payer: Medicare HMO | Admitting: Gastroenterology

## 2021-05-25 MED ORDER — FENTANYL 75 MCG/HR TD PT72
1.0000 | MEDICATED_PATCH | TRANSDERMAL | 0 refills | Status: DC
Start: 2021-05-25 — End: 2021-06-15

## 2021-05-25 NOTE — Telephone Encounter (Signed)
ERx 

## 2021-06-01 ENCOUNTER — Other Ambulatory Visit: Payer: Self-pay | Admitting: Gastroenterology

## 2021-06-02 ENCOUNTER — Telehealth: Payer: Self-pay | Admitting: Family Medicine

## 2021-06-02 NOTE — Telephone Encounter (Signed)
Agree with this verbal order. Thank you.

## 2021-06-02 NOTE — Telephone Encounter (Signed)
Jenny--[facility] [phone]  Need verbal orders for social work visit   Reason: cognitive decline

## 2021-06-03 NOTE — Telephone Encounter (Signed)
Received call back from [place] verbal orders given no further action.

## 2021-06-03 NOTE — Telephone Encounter (Signed)
Lvm asking [name] to call back.  Need to informing her Dr. Darnell Level is giving verbal orders for services requested.

## 2021-06-03 NOTE — Telephone Encounter (Signed)
Last filled 01/12/21 with 1 refill... Has appt scheduled for 07/2021... please advise

## 2021-06-07 ENCOUNTER — Telehealth: Payer: Self-pay | Admitting: Family Medicine

## 2021-06-07 NOTE — Telephone Encounter (Signed)
[name] from [facility] [phone]) States the patient should be seen again in 2 weeks, need an order for that. Also, wanting to know when was the last psychiatric appointment evaluation? Do we offer that or does the patient get referred?  Patient is on CYMBALTA but still showing signs of depressive symptoms so she just wants to know what we can do. Please advise.

## 2021-06-09 ENCOUNTER — Telehealth: Payer: Self-pay | Admitting: Family Medicine
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
## 2021-06-09 NOTE — Telephone Encounter (Signed)
Noted. Will await further recommendations.

## 2021-06-09 NOTE — Telephone Encounter (Signed)
Please touch base with wife - he is on full dose Cymbalta. Next step if ongoing depression is addition of second medication or referral to psychiatry which we have previously discussed. Would she/pt agree with seeing psychiatry for severe depression despite full dose cymbalta?   Then touch base Marissa Melissa and notify her of above. I also agree with rpt visit in 2 wks.

## 2021-06-10 NOTE — Telephone Encounter (Signed)
Left message on voicemail for patient's wife to call the office back.

## 2021-06-13 ENCOUNTER — Telehealth: Payer: Self-pay | Admitting: Family Medicine

## 2021-06-13 NOTE — Telephone Encounter (Signed)
Pt wife would like dr g to call her because he need a refill on his medication

## 2021-06-14 NOTE — Telephone Encounter (Incomplete Revision)
1.Medication Requested:  fentaNYL (DURAGESIC) 75 MCG/HR  2. Pharmacy (Name, [place], [place]  [id number] [place] [address]:  [phone] Fax:  [phone]     3. On Med List: Y  4. Last Visit with PCP: 10.18.22  5. Next visit date with PCP: 1.23.23  Patient will be out of medication on 1.21.23

## 2021-06-14 NOTE — Telephone Encounter (Addendum)
1.Medication Requested:  fentaNYL (DURAGESIC) 75 MCG/HR  2. Pharmacy (Name, [place], [place]):  [id number] [place], [address]:  [phone] Fax:  [phone]     3. On Med List: Y  4. Last Visit with PCP: 10.18.22  5. Next visit date with PCP: 1.23.23  Patient will be out of medication on 1.21.23

## 2021-06-15 MED ORDER — ARIPIPRAZOLE 2 MG PO TABS: 2.0000 mg | ORAL_TABLET | Freq: Every day | ORAL | 1 refills | Status: DC

## 2021-06-15 NOTE — Telephone Encounter (Signed)
Returning phone call

## 2021-06-15 NOTE — Telephone Encounter (Signed)
Lvm asking [name] of [facility] to call back.  Need to relay Dr. Synthia Innocent message.   Also, lvm for pt's wife, [name] (on dpr) to call back.  Need to relay Dr. Synthia Innocent message.

## 2021-06-15 NOTE — Telephone Encounter (Signed)
Spoke to patient's wife and was advised that her husband will not agree to see psychiatry. Patient's wife wants to know if another medication can be added to Cymbalta.  Patient's wife stated that her husband wants to sleep all the time and just does not care about anything. Patient's wife stated that they have tried Wellbutrin and all it did was make him cry a lot.  [facility] Left a message on voicemail for Marissa to call the office back.

## 2021-06-15 NOTE — Telephone Encounter (Signed)
I'd like him to try abilify 54m daily - sent to pharmacy. This will be in addition to cymbalta 669mdaily.  Watch for agitation, low BP, shakes/tremor.

## 2021-06-15 NOTE — Addendum Note (Signed)
Addended by: Ria Bush on: 06/15/2021 11:13 AM   Modules accepted: Orders

## 2021-06-15 NOTE — Telephone Encounter (Signed)
[name] was advised as instructed by telephone. [name] stated that she has an upcoming appointment scheduled with patient Monday 06/20/21. [name] stated that she would like to know  what Dr. Danise Mina decides about adding additional medication.

## 2021-06-15 NOTE — Telephone Encounter (Signed)
Last refill 05/25/21 #15 Upcoming appointment 06/27/21 Last office visit 04/25/21 acute/video Last UDS 03/22/21

## 2021-06-17 MED ORDER — FENTANYL 75 MCG/HR TD PT72: 1.0000 | MEDICATED_PATCH | TRANSDERMAL | 0 refills | Status: DC

## 2021-06-17 NOTE — Telephone Encounter (Signed)
Spoke with Marissa relaying Dr. Synthia Innocent message.  Verbalizes understanding and will inform pt/pt's wife, [name], since she will be seeing pt soon.

## 2021-06-17 NOTE — Telephone Encounter (Signed)
ERx 

## 2021-06-22 ENCOUNTER — Ambulatory Visit: Payer: Medicare HMO | Admitting: Family Medicine

## 2021-06-22 NOTE — Telephone Encounter (Signed)
Pt wife called they need a PA for the Fentanyl, also she wanted to let Dr. Darnell Level. know that he had a fall yesterday, he is doing ok just sore

## 2021-06-23 ENCOUNTER — Telehealth: Payer: Self-pay

## 2021-06-23 ENCOUNTER — Telehealth: Payer: Self-pay | Admitting: Family Medicine

## 2021-06-23 NOTE — Telephone Encounter (Signed)
Noted  

## 2021-06-23 NOTE — Telephone Encounter (Signed)
Submitting PA.    Fwd message to Dr. Darnell Level concerning fall.
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
## 2021-06-23 NOTE — Telephone Encounter (Signed)
Submitted PA; key:  [id number], PA case ID:  [id number].  Decision pending.

## 2021-06-24 NOTE — Telephone Encounter (Signed)
Received faxed PA denial.  Reason:  The dose of this opioid is mor than the daily allowed morphine equivalent dose (MED).   To resolve this safety concern, [employer] needs to know if one of the following applies to the pt: - has an active cancer diagnosis, or is actively receiving cancer tx - has a diagnosis of sickle cell dz - tx is for palliative care or pt is a resident of a long term care facility - provider attests the MED is medically necessary

## 2021-06-24 NOTE — Telephone Encounter (Signed)
Noted  

## 2021-06-27 ENCOUNTER — Ambulatory Visit (INDEPENDENT_AMBULATORY_CARE_PROVIDER_SITE_OTHER): Payer: Medicare HMO | Admitting: Family Medicine

## 2021-06-27 ENCOUNTER — Other Ambulatory Visit: Payer: Self-pay

## 2021-06-27 ENCOUNTER — Encounter: Payer: Self-pay | Admitting: Family Medicine

## 2021-06-27 VITALS — BP 126/82 | HR 75 | Temp 97.4°F | Ht 69.0 in | Wt 275.0 lb

## 2021-06-27 DIAGNOSIS — R413 Other amnesia: Secondary | ICD-10-CM

## 2021-06-27 DIAGNOSIS — R188 Other ascites: Secondary | ICD-10-CM

## 2021-06-27 DIAGNOSIS — R419 Unspecified symptoms and signs involving cognitive functions and awareness: Secondary | ICD-10-CM | POA: Insufficient documentation

## 2021-06-27 DIAGNOSIS — F17200 Nicotine dependence, unspecified, uncomplicated: Secondary | ICD-10-CM

## 2021-06-27 DIAGNOSIS — G894 Chronic pain syndrome: Secondary | ICD-10-CM

## 2021-06-27 DIAGNOSIS — F332 Major depressive disorder, recurrent severe without psychotic features: Secondary | ICD-10-CM | POA: Diagnosis not present

## 2021-06-27 DIAGNOSIS — R296 Repeated falls: Secondary | ICD-10-CM

## 2021-06-27 DIAGNOSIS — K7460 Unspecified cirrhosis of liver: Secondary | ICD-10-CM | POA: Diagnosis not present

## 2021-06-27 DIAGNOSIS — Z96641 Presence of right artificial hip joint: Secondary | ICD-10-CM

## 2021-06-27 DIAGNOSIS — G8929 Other chronic pain: Secondary | ICD-10-CM
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
## 2021-06-27 NOTE — Assessment & Plan Note (Signed)
Significant impairment by MMSE of 20/30 (12/2020).  Comorbidities obfuscate diagnosis - sedating medications and opiate use, liver cirrhosis can contribute to memory impairment.  Will work towards minimizing sedating medications - stop robaxin, drop gabapentin to 328m bid (although it's not hepatically metabolized), start fentanyl taper.  Consider cholinergic medication vs neuro eval.  Reassess at f/u visit.
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
## 2021-06-27 NOTE — Telephone Encounter (Signed)
Discussed with patient at [place].

## 2021-06-27 NOTE — Assessment & Plan Note (Signed)
Presumed NASH cirrhosis followed by GI, appreciate Dr Georgeann Oppenheim care. They state xifaxan was not approved by insurance.  Reviewed lactulose dosing - which is currently limited by stool urgency/incontinence. He is having 1 stool/day.  Reviewed effect of cirrhosis, specifically hepatic encephalopathy, on mentation.

## 2021-06-27 NOTE — Assessment & Plan Note (Addendum)
Concern opiate is contributing to memory difficulties as well as fall risk - recommend slow taper, so will drop fentanyl TD patch replacement from Q48 hours to Q72 hours. Update with effect. Will plan continued slow titration - next step would be 37mg patch Q72 hours.

## 2021-06-27 NOTE — Patient Instructions (Addendum)
Call [facility] [facility] to inquire about appointment.  We will refer you to psychiatry.  Start dropping fentanyl dose to 43mg patch every 3 days.  Update me with how you're doing with extending course.  Drop gabapentin to twice daily.  Stop robaxin.  Return in 3 months for physical /wellness visit

## 2021-06-27 NOTE — Assessment & Plan Note (Addendum)
[place] CSRS reviewed. Plan was to establish with [facility], still working on this.  We received records from [facility] in [place].  See above regarding slow taper of fentanyl.

## 2021-06-27 NOTE — Assessment & Plan Note (Signed)
HHPT involved.  Increasing falls - will low total daily opiate dose and monitor effect.

## 2021-06-27 NOTE — Assessment & Plan Note (Signed)
Chronic, severe, not responsive to full dose cymbalta 59m daily. We recently started abilify 278mdaily - too soon to tell effect.  He is interested in psychiatry evaluation - referral placed.

## 2021-06-27 NOTE — Assessment & Plan Note (Addendum)
Concern memory impairment making living at home unsafe. Discussed this with patient - he is not willing to consider placement at this time. Encouraged he continue exploring options with Education officer, [employer].

## 2021-07-04 ENCOUNTER — Telehealth: Payer: Self-pay | Admitting: Family Medicine

## 2021-07-04 NOTE — Progress Notes (Deleted)
07/05/2021 8:38 PM   [name] 1955/07/01 [id number]  Referring provider: Ria Bush, MD [place],  [place] [address]  No chief complaint on file.  Urological history: 1. High risk hematuria -smoker -CT angiogram 2018 - small benign left renal cyst -cysto 2018 -hypervascular prostate -no reports of gross heme  2. Renal cyst -contrast CT 2022 - left renal cyst  3. BPH with LU TS  HPI: [name] is a 66 y.o. male who presents today for infection and bleeding down in the groin area.      PMH: Past Medical History:  Diagnosis Date   AAA (abdominal aortic aneurysm) 09/2012--  monitored by dr Trula Slade   stable 5.6cm CTA abdomen 2016   Abnormal drug screen 07/09/2016   1/2/018 - positive oxycodone, fentanyl, inapprop positive MJ - mod risk   Allergic rhinitis    Ascites 03/2019   B12 deficiency    CAD (coronary artery disease) cardiologist-  dr Stanford Breed   x3 with stents last 2005, EF 40%, predominantly RCA by CT 2016   Cataracts, bilateral    Cervical spondylosis 05/2010   s/p surgery   Charcot Lelan Pons Tooth muscular atrophy dx  1975   neurologist--  dr love--  type 2 per pt   Chronic pain syndrome    [facility] with [facility] --> disagreement and transfered care to Dr Sanjuan Dame at [facility], requests PCP write Rx but f/u with pain clinic Q6-12 months   COPD (chronic obstructive pulmonary disease) ([place]) 10/2011   minimal by PFTs   DDD (degenerative disc disease)    Disturbances of sensation of smell and taste    improving   Dyspnea on exertion    GERD (gastroesophageal reflux disease)    Gout    Headache    Hepatitis    hepatitis B   Hidradenitis    right groin   Hidradenitis suppurativa dx 2011   goin and leg crease   followd by Lyndle Herrlich - daily bactrim, s/p intralesional steroid injection 10/2010   Hip osteoarthritis    s/p intraarticular steroid shot (12/2012) ([place])    History of hepatitis B 1983   History of MI (myocardial infarction)    2000  &  2005   History of pneumonia    History of viral meningitis 2000   HLD (hyperlipidemia)    HTN (hypertension)    Ischemic cardiomyopathy    s/p inferior MI  --  current ef per myoview 39%   Liver cirrhosis secondary to NASH ([place]) 01/2014   by CT scan, rec virtual colonoscopy by Dr Collene Mares 06/2014   Lumbar herniated disc    Myocardial infarction ([place])    x2   Nocturia more than twice per night    Obesity    Spinal stenosis    released from [place].  established with [facility] (07/2013)   T2DM (type 2 diabetes mellitus) ([place])    ABIs WNL 2016   Vitamin D deficiency     Surgical History: Past Surgical History:  Procedure Laterality Date   ABDOMINAL AORTIC ENDOVASCULAR FENESTRATED STENT GRAFT N/A 11/30/2015   Procedure: ABDOMINAL AORTIC ENDOVASCULAR FENESTRATED STENT GRAFT;  Surgeon: Serafina Mitchell, MD;  Location: [facility];  Service: Vascular;  Laterality: N/A;   ANTERIOR CERVICAL DECOMP/DISCECTOMY FUSION  01-07-2010    C4 -- C7   CARDIAC CATHETERIZATION  03-30-2005  dr Albertine Patricia   ef 40% w/ inferior akinesis/  LM and CFX angiographically  normal/  pLAD 30%/   Widely patent stents in RCA and PDA widely patent   CARDIOVASCULAR STRESS TEST  10-23-2012  dr Stanford Breed   No ischemia/  Moderate scar in the inferior wall, otherwise normal perfusion/  LV ef 39%,  LV wall motion: inferior/ inferolateral hypokinesis   COLONOSCOPY  05/06/2007   normal, small int hemorrhoids rpt 5 yrs due to fmhx - rec against rpt colonoscopy by Dr Collene Mares   CORONARY ANGIOPLASTY  2000  dr Stanford Breed   PCI to RCA and PDA   CORONARY ANGIOPLASTY WITH STENT PLACEMENT  03-19-2005  dr Gwyndolyn Saxon downey   inferior STEMI--- DES x4 to RCA w/ balloon angioplasty and balloon angioplasty to jailed PDA ostium/  severe hypokinesis of midinferor wall, ef 50%/  dLM 20%,  mLAD 20%,  dCFX 60%   ESOPHAGOGASTRODUODENOSCOPY  01/2017   dilated benign esophageal stenosis,  portal hypertensive gastropathy Henrene Pastor)   ESOPHAGOGASTRODUODENOSCOPY (EGD) WITH PROPOFOL N/A 02/20/2018   benign biopsy Vicente Males, Bailey Mech, MD)   ESOPHAGOGASTRODUODENOSCOPY (EGD) WITH PROPOFOL N/A 05/12/2019   Procedure: ESOPHAGOGASTRODUODENOSCOPY (EGD) WITH PROPOFOL;  Surgeon: [name], MD;  Location: [facility] ENDOSCOPY;  Service: Gastroenterology;  Laterality: N/A;   HYDRADENITIS EXCISION Right 12/31/2014   Procedure: WIDE EXCISION HIDRADENITIS GROIN; Coralie Keens, MD   IR PARACENTESIS  03/25/2019   LUMBAR DISC SURGERY     L5-S1   LUMBAR LAMINECTOMY  05-18-2010   L2--5   laminectomy/foraminotomy for stenosis ([place])   MYELOGRAM     L5-S1 and L1-2 spondylosis   SACROILIAC JOINT INJECTION Bilateral 10/2013   [place]   TONSILLECTOMY AND ADENOIDECTOMY  1972    Home Medications:  Allergies as of 07/05/2021       Reactions   Statins Shortness Of Breath   Cough, trouble breathing Cough, trouble breathing   Losartan Other (See Comments)   Causes him to have pain   Wellbutrin [bupropion] Other (See Comments)   Worsened mood - crying   Allopurinol Nausea Only   Baclofen Nausea And Vomiting   Penicillins Nausea And Vomiting   Did it involve swelling of the face/tongue/throat, SOB, or low BP? N/A Did it involve sudden or severe rash/hives, skin peeling, or any reaction on the inside of your mouth or nose? N/A Did you need to seek medical attention at a hospital or doctor's office? N/A When did it last happen? Child     If all above answers are "NO", may proceed with cephalosporin use.   Tramadol Nausea Only        Medication List        Accurate as of July 04, 2021  8:38 PM. If you have any questions, ask your nurse or doctor.          acetaminophen 500 MG tablet Commonly known as: TYLENOL Take 1 tablet (500 mg total) by mouth in the morning, at noon, and at bedtime.   albuterol (2.5 MG/3ML) 0.083% nebulizer solution Commonly known as: PROVENTIL USE 1 VIAL PER NEBULIZER  EVERY 6 HRS AS NEEDED FOR WHEEZING   ARIPiprazole 2 MG tablet Commonly known as: Abilify Take 1 tablet (2 mg total) by mouth daily.   aspirin 81 MG tablet Take 1 tablet (81 mg total) by mouth daily.   Ca Phosphate-Cholecalciferol [phone] MG-UNIT Tabs Take 1 tablet by mouth daily.   DULoxetine 60 MG capsule Commonly known as: CYMBALTA Take 1 capsule (60 mg total) by mouth daily.   fentaNYL 75 MCG/HR Commonly known as: [place] 1 patch onto the skin every 3 (  three) days.   ferrous sulfate 325 (65 FE) MG tablet Take 1 tablet (325 mg total) by mouth every other day.   fluticasone 50 MCG/ACT nasal spray Commonly known as: FLONASE Place 2 sprays into both nostrils daily as needed for allergies.   folic acid 1 MG tablet Commonly known as: FOLVITE TAKE 1 TABLET BY MOUTH EVERY DAY   furosemide 40 MG tablet Commonly known as: LASIX Take 1 tablet (40 mg total) by mouth daily.   gabapentin 300 MG capsule Commonly known as: NEURONTIN Take 1 capsule (300 mg total) by mouth in the morning and at bedtime.   Lactulose 20 GM/30ML Soln Take 30 mLs (20 g total) by mouth 3 (three) times daily.   losartan 25 MG tablet Commonly known as: COZAAR Take 1 tablet (25 mg total) by mouth daily.   metoprolol succinate 25 MG 24 hr tablet Commonly known as: TOPROL-XL Take 1 tablet (25 mg total) by mouth daily.   Nebulizer Devi Use as instructed for albuterol nebulization, with necessary tubing   nitroGLYCERIN 0.4 MG SL tablet Commonly known as: NITROSTAT Place 1 tablet (0.4 mg total) under the tongue every 5 (five) minutes as needed for chest pain.   nystatin cream Commonly known as: MYCOSTATIN APPLY TO AFFECTED AREA TWICE A DAY   omeprazole 40 MG capsule Commonly known as: PRILOSEC TAKE 1 CAPSULE BY MOUTH EVERY DAY   ondansetron 4 MG tablet Commonly known as: ZOFRAN TAKE 1 TABLET BY MOUTH EVERY 8 HOURS AS NEEDED FOR NAUSEA AND VOMITING   Red Yeast Rice 600 MG Caps Take 2  capsules by mouth daily.   rifaximin 550 MG Tabs tablet Commonly known as: XIFAXAN Take 1 tablet (550 mg total) by mouth 2 (two) times daily.   spironolactone 100 MG tablet Commonly known as: ALDACTONE Take 1 tablet (100 mg total) by mouth daily.   SUPER B COMPLEX PO Take 1 tablet by mouth daily.   Tart Cherry Advanced Caps Take 1 capsule by mouth 2 (two) times daily.   vitamin B-12 500 MCG tablet Commonly known as: CYANOCOBALAMIN Take 1 tablet (500 mcg total) by mouth every Monday, Wednesday, and Friday.   Vitamin D3 50 MCG (2000 UT) Tabs Take 2,000 Units by mouth daily.        Allergies:  Allergies  Allergen Reactions   Statins Shortness Of Breath    Cough, trouble breathing Cough, trouble breathing   Losartan Other (See Comments)    Causes him to have pain   Wellbutrin [Bupropion] Other (See Comments)    Worsened mood - crying   Allopurinol Nausea Only   Baclofen Nausea And Vomiting   Penicillins Nausea And Vomiting    Did it involve swelling of the face/tongue/throat, SOB, or low BP? N/A Did it involve sudden or severe rash/hives, skin peeling, or any reaction on the inside of your mouth or nose? N/A Did you need to seek medical attention at a hospital or doctor's office? N/A When did it last happen? Child     If all above answers are "NO", may proceed with cephalosporin use.   Tramadol Nausea Only    Family History: Family History  Problem Relation Age of Onset   Cancer Mother        colon   Diabetes Mother    Kidney disease Mother    Aneurysm Mother        AAA   Rheum arthritis Mother    Charcot-Marie-Tooth disease Mother    Heart disease Mother  before age 38   Cancer Father        skin   Heart attack Father    Heart disease Father        before age 73   Cancer Brother        skin   Coronary artery disease Brother    Cancer Brother        small cell lung cancer   Aneurysm Brother        AAA   Rheum arthritis Sister    Rheum  arthritis Brother    Prostate cancer Neg Hx    Bladder Cancer Neg Hx    Kidney cancer Neg Hx     Social History:  reports that he has been smoking cigarettes and e-cigarettes. He started smoking about 54 years ago. He has a 57.00 pack-year smoking history. He has never used smokeless tobacco. He reports current drug use. Drugs: Fentanyl and Hydrocodone. He reports that he does not drink alcohol.  ROS: Pertinent ROS in HPI  Physical Exam: There were no vitals taken for this visit.  Constitutional:  Well nourished. Alert and oriented, No acute distress. HEENT: [place] AT, moist mucus membranes.  Trachea midline, no masses. Cardiovascular: No clubbing, cyanosis, or edema. Respiratory: Normal respiratory effort, no increased work of breathing. GI: Abdomen is soft, non tender, non distended, no abdominal masses. Liver and spleen not palpable.  No hernias appreciated.  Stool sample for occult testing is not indicated.   GU: No CVA tenderness.  No bladder fullness or masses.  Patient with circumcised/uncircumcised phallus. ***Foreskin easily retracted***  Urethral meatus is patent.  No penile discharge. No penile lesions or rashes. Scrotum without lesions, cysts, rashes and/or edema.  Testicles are located scrotally bilaterally. No masses are appreciated in the testicles. Left and right epididymis are normal. Rectal: Patient with  normal sphincter tone. Anus and perineum without scarring or rashes. No rectal masses are appreciated. Prostate is approximately *** grams, *** nodules are appreciated. Seminal vesicles are normal. Skin: No rashes, bruises or suspicious lesions. Lymph: No cervical or inguinal adenopathy. Neurologic: Grossly intact, no focal deficits, moving all 4 extremities. Psychiatric: Normal mood and affect.  Laboratory Data: Lab Results  Component Value Date   WBC 5.6 03/22/2021   HGB 11.8 (L) 03/22/2021   HCT 34.8 (L) 03/22/2021   MCV 103.5 (H) 03/22/2021   PLT 45.0 Repeated and  verified X2. (LL) 03/22/2021    Lab Results  Component Value Date   CREATININE 0.91 03/22/2021     Lab Results  Component Value Date   HGBA1C 4.9 07/30/2020    Lab Results  Component Value Date   TSH 1.65 07/30/2020       Component Value Date/Time   CHOL 143 07/30/2020 1218   HDL 38.00 (L) 07/30/2020 1218   CHOLHDL 4 07/30/2020 1218   VLDL 17.4 07/30/2020 1218   LDLCALC 88 07/30/2020 1218    Lab Results  Component Value Date   AST 27 03/22/2021   Lab Results  Component Value Date   ALT 14 03/22/2021  I have reviewed the labs.   Pertinent Imaging: N/A  Assessment & Plan:    1. Hidradenitis ***  No follow-ups on file.  These notes generated with voice recognition software. I apologize for typographical errors.  Zara Council, PA-C  [facility] [address]  [place], [place] [address] [phone]

## 2021-07-04 NOTE — Telephone Encounter (Signed)
Spoke to [place] and was advised that she sent the request over by fax on 06/28/21. Tim Lair stated that she will fax the request again today to get patient's medical records.

## 2021-07-04 NOTE — Telephone Encounter (Signed)
Tim Lair with [employer] called wanting to confirm that Dr Darnell Level got the referral for pt. Please advise.

## 2021-07-04 NOTE — Telephone Encounter (Signed)
Dr. Darnell Level, do you have this ppw?  I haven't seen it in [place] on left on my desk.

## 2021-07-04 NOTE — Telephone Encounter (Signed)
Have not received it. Please have them resubmit.

## 2021-07-05 ENCOUNTER — Ambulatory Visit: Payer: Medicare HMO | Admitting: Urology

## 2021-07-05 DIAGNOSIS — L732 Hidradenitis suppurativa: Secondary | ICD-10-CM

## 2021-07-05 NOTE — Telephone Encounter (Signed)
Noted  

## 2021-07-05 NOTE — Telephone Encounter (Signed)
See OV notes- Dr Darnell Level discussed with patient to titrate slowly down on how often and dose if Fentanyl patch patient is using. Directions have been updated to use 1 patch every 3 days ( every 72 hours) instead of every 45 hours.  I called [employer] urgent appeal department and submitted this information and answered more questions. Turn around for an answer will be around 24 to 72 hours.  Reference # for the call is [id number]

## 2021-07-07 ENCOUNTER — Telehealth: Payer: Self-pay | Admitting: Family Medicine

## 2021-07-07 NOTE — Telephone Encounter (Signed)
Ms. Freda Munro called in from [facility] and wanted to know about having Lattie Haw or Dr. Darnell Level to get more info for the medication of the fentanyl patches

## 2021-07-07 NOTE — Telephone Encounter (Signed)
Spoke with [place] concerning fentanyl patch and to provide needed additional info. Says she will fax the appeal form to be completed and faxed back asap.

## 2021-07-08 NOTE — Telephone Encounter (Signed)
I see the medical records request was scanned into pt's chart today.   Can the notes from 10/2020- present be faxed to [phone], attn:  Currie Paris ASAP?  They are waiting for this info.

## 2021-07-08 NOTE — Telephone Encounter (Signed)
Received faxed form for PA additional info.  Completed form and faxed back today. [Fyi, same form Anastasiya completed and faxed back on 07/06/21. See 06/23/21 phn note.]  Decision pending.

## 2021-07-08 NOTE — Telephone Encounter (Signed)
See 07/07/21 phn note.

## 2021-07-09 ENCOUNTER — Other Ambulatory Visit: Payer: Self-pay | Admitting: Family Medicine

## 2021-07-11 ENCOUNTER — Telehealth: Payer: Self-pay | Admitting: Family Medicine

## 2021-07-11 NOTE — Telephone Encounter (Signed)
Spoke with pt's wife, [name] (on dpr), notifying her the referral is in progress and they will be contacted to schedule an appt.

## 2021-07-11 NOTE — Telephone Encounter (Signed)
Pt wife called checking on the status of the psychiatrist. Pt wife would like a call back to discuss. Please advise.

## 2021-07-14 NOTE — Telephone Encounter (Signed)
Ok to send 90-day supply?

## 2021-07-14 NOTE — Telephone Encounter (Signed)
Spoke with patient wife(DPR) and she is going to call [facility] to schedule an appt for Ron - referral was faxed over on 07/08/21  She is wanting to discuss continuing issues with patient - verbally abusive, not motivated, lying, patient is not wanting to take care of himself.Miguel KitchenMarland KitchenPatient sleeps a lot ( was in the bed sleeping during this call). A friend is living with them now (named Thayer Jew) and he could be heard in the background agreeing to everything [name] was stating about the patient and strongly agrees the patient is verbally abusive towards everyone.  Wife is requesting a refill of patients Fentanyl Rx Pain Management Referral is still under review with [facility]. She is going to call them again to see if able to schedule now.  Patient wife is also having issues with Education officer, museum.

## 2021-07-14 NOTE — Telephone Encounter (Signed)
ERx 

## 2021-07-15 MED ORDER — FENTANYL 50 MCG/HR TD PT72: 1.0000 | MEDICATED_PATCH | TRANSDERMAL | 0 refills | Status: DC

## 2021-07-15 NOTE — Telephone Encounter (Signed)
We discussed slow taper at last office visit - will continue this by dropping to 70mg Q72 hours for next refill.  Agree with scheduling psych evaluation. What issues are they having with SW?

## 2021-07-15 NOTE — Addendum Note (Signed)
Addended by: Ria Bush on: 07/15/2021 06:31 PM   Modules accepted: Orders

## 2021-07-18 ENCOUNTER — Encounter: Payer: Self-pay | Admitting: Gastroenterology

## 2021-07-18 ENCOUNTER — Ambulatory Visit: Payer: Medicare HMO | Admitting: Gastroenterology

## 2021-07-18 ENCOUNTER — Other Ambulatory Visit: Payer: Self-pay

## 2021-07-18 VITALS — BP 138/88 | HR 103 | Temp 99.4°F | Wt 281.0 lb

## 2021-07-18 DIAGNOSIS — R188 Other ascites: Secondary | ICD-10-CM

## 2021-07-18 DIAGNOSIS — K7460 Unspecified cirrhosis of liver: Secondary | ICD-10-CM | POA: Diagnosis not present

## 2021-07-18 DIAGNOSIS — K7682 Hepatic encephalopathy: Secondary | ICD-10-CM | POA: Diagnosis not present

## 2021-07-18 NOTE — Progress Notes (Signed)
[name] MD, MRCP(U.K) [address]  [place], [place] [address]  Main: [phone]  Fax: [phone]   Primary Care Physician: Ria Bush, MD  Primary Gastroenterologist:  Dr. [name]   Chief Complaint  Patient presents with   Cirrhosis of the liver    HPI: [name] is a 66 y.o. male  Summary of history :   He was initially referred and seen on 02/13/18 for cirrhosis of the liver . He has been seen by [facility] in the past.. His Cirrhosis had been attriuted to NASH. Used cocaine in his 20's, none recently. Weighed 370 lbs at his heaviest. Was always " big", he is not diabetic.      CT angio of the abdomen 12/2017:Post endovascular repair of suprarenal abdominal aortic aneurysm without evidence of complication .Suspected hemodynamically significant stenosis involving the origin of the common hepatic artery, similar to the 12/2015 examination.Potential hemodynamically significant narrowing involving theproximal aspect of the right SFA.Findings of hepatic cirrhosis and portal venous hypertension with several varices about the gastroesophageal junction, splenomegaly and trace amount of intra-abdominal ascites. No discrete hyperenhancing hepatic lesions   02/13/18 : Cr 1.03,Hep A ab positive , albumin 3.0 ,alk phos 149 , Hbsab/ag,HCV ab , ANA,negative .Ceruloplasmin ,alpha 1 AT,Iron studies - normal   03/19/18 : liver biopsy : steatosis , focal plasma cells, mild chronic cholestasis . Unmable to get a diagnosis due to burned out liver disease.    05/12/2019: EGD: Normal    Smooth muscle antibody positive , elevated immunoglobulins G, AMA,LKM ab  negative Hospital admission in 03/26/2019 for ascites, underwent paracentesis.   07/14/2020 CT scan of the chest abdomen pelvis with contrast shows findings consistent with cirrhosis and portal hypertension stable common bile duct dilation since 2020      Interval history 02/21/2021-07/18/2021  Gained 9  pounds since last visit.  He was recently started on Abilify and since then his appetite is decreased significantly recently has been eating a lot of junk food/processed foods that are thought.  He still has not received with Xifaxan states issues with the paperwork did not call our office regularly after that.  On lactulose but only takes it once a day unclear how many bowel movements he has had he has been confused on and off.  Family members been very concerned about it.       Current Outpatient Medications  Medication Sig Dispense Refill   acetaminophen (TYLENOL) 500 MG tablet Take 1 tablet (500 mg total) by mouth in the morning, at noon, and at bedtime.     albuterol (PROVENTIL) (2.5 MG/3ML) 0.083% nebulizer solution USE 1 VIAL PER NEBULIZER EVERY 6 HRS AS NEEDED FOR WHEEZING 75 mL 6   ARIPiprazole (ABILIFY) 2 MG tablet TAKE 1 TABLET BY MOUTH EVERY DAY 90 tablet 1   aspirin 81 MG tablet Take 1 tablet (81 mg total) by mouth daily.     B Complex-C (SUPER B COMPLEX PO) Take 1 tablet by mouth daily.     Ca Phosphate-Cholecalciferol [phone] MG-UNIT TABS Take 1 tablet by mouth daily.      cholecalciferol 2000 units TABS Take 2,000 Units by mouth daily.     DULoxetine (CYMBALTA) 60 MG capsule Take 1 capsule (60 mg total) by mouth daily. 90 capsule 3   fentaNYL (DURAGESIC) 50 MCG/HR Place 1 patch onto the skin every 3 (three) days. 10 patch 0   ferrous sulfate 325 (65 FE) MG tablet Take 1 tablet (325  mg total) by mouth every other day.     fluticasone (FLONASE) 50 MCG/ACT nasal spray Place 2 sprays into both nostrils daily as needed for allergies.      folic acid (FOLVITE) 1 MG tablet TAKE 1 TABLET BY MOUTH EVERY DAY 90 tablet 0   furosemide (LASIX) 40 MG tablet Take 1 tablet (40 mg total) by mouth daily. 90 tablet 1   gabapentin (NEURONTIN) 300 MG capsule Take 1 capsule (300 mg total) by mouth in the morning and at bedtime.     Lactulose 20 GM/30ML SOLN Take 30 mLs (20 g total) by mouth 3  (three) times daily. 2700 mL 5   losartan (COZAAR) 25 MG tablet Take 1 tablet (25 mg total) by mouth daily. 90 tablet 3   metoprolol succinate (TOPROL-XL) 25 MG 24 hr tablet Take 1 tablet (25 mg total) by mouth daily. 90 tablet 3   Misc Natural Products (TART CHERRY ADVANCED) CAPS Take 1 capsule by mouth 2 (two) times daily.     nitroGLYCERIN (NITROSTAT) 0.4 MG SL tablet Place 1 tablet (0.4 mg total) under the tongue every 5 (five) minutes as needed for chest pain. 25 tablet 12   nystatin cream (MYCOSTATIN) APPLY TO AFFECTED AREA TWICE A DAY 75 g 0   omeprazole (PRILOSEC) 40 MG capsule TAKE 1 CAPSULE BY MOUTH EVERY DAY 90 capsule 0   ondansetron (ZOFRAN) 4 MG tablet TAKE 1 TABLET BY MOUTH EVERY 8 HOURS AS NEEDED FOR NAUSEA AND VOMITING 30 tablet 1   Red Yeast Rice 600 MG CAPS Take 2 capsules by mouth daily.     Respiratory Therapy Supplies (NEBULIZER) DEVI Use as instructed for albuterol nebulization, with necessary tubing 1 each 0   rifaximin (XIFAXAN) 550 MG TABS tablet Take 1 tablet (550 mg total) by mouth 2 (two) times daily. 60 tablet 11   spironolactone (ALDACTONE) 100 MG tablet Take 1 tablet (100 mg total) by mouth daily. 90 tablet 1   vitamin B-12 (CYANOCOBALAMIN) 500 MCG tablet Take 1 tablet (500 mcg total) by mouth every Monday, Wednesday, and Friday.     No current facility-administered medications for this visit.    Allergies as of 07/18/2021 - Review Complete 07/18/2021  Allergen Reaction Noted   Statins Shortness Of Breath 01/22/2012   Losartan Other (See Comments) 01/30/2013   Wellbutrin [bupropion] Other (See Comments) 08/03/2020   Allopurinol Nausea Only 12/18/2012   Baclofen Nausea And Vomiting 06/23/2015   Penicillins Nausea And Vomiting 02/20/2018   Tramadol Nausea Only 03/13/2017    ROS:  General: Negative for anorexia, weight loss, fever, chills, fatigue, weakness. ENT: Negative for hoarseness, difficulty swallowing , nasal congestion. CV: Negative for chest  pain, angina, palpitations, dyspnea on exertion, peripheral edema.  Respiratory: Negative for dyspnea at rest, dyspnea on exertion, cough, sputum, wheezing.  GI: See history of present illness. GU:  Negative for dysuria, hematuria, urinary incontinence, urinary frequency, nocturnal urination.  Endo: Negative for unusual weight change.    Physical Examination:   BP 138/88    Pulse (!) 103    Temp 99.4 F (37.4 C) (Oral)    Wt 281 lb (127.5 kg)    BMI 41.50 kg/m   General: Well-nourished, well-developed in no acute distress.  Eyes: No icterus. Conjunctivae pink. Mouth: Oropharyngeal mucosa moist and pink , no lesions erythema or exudate. Lungs: Clear to auscultation bilaterally. Non-labored. Heart: Regular rate and rhythm, no murmurs rubs or gallops.  Abdomen: Bowel sounds are normal, nontender, nondistended, no hepatosplenomegaly or masses,  no abdominal bruits or hernia , no rebound or guarding.   Extremities: No lower extremity edema. No clubbing or deformities. Neuro: Alert and oriented x 3.  Grossly intact. Skin: Warm and dry, no jaundice.   Psych: Alert and cooperative, normal mood and affect.   Imaging Studies: No results found.  Assessment and Plan:   [name] is a 66 y.o. y/o male   here to follow up  for  liver cirrhosis likely secondary to [facility] with ascites . Autoimmune work up shows a positive smooth muscle ab and elevated immunoglobulins  .MELD 7 in 12/2020  Liver biopsy is inconclusive due to burnt out liver.  Does have features of overt hepatic encephalopathy.  Not obtained Xifaxan yet.   No prior evidence of esophageal varices.  He is immune to hepatitis A and B, received pneumococcal vaccination and March 2022, up-to-date with COVID-vaccine and flu shot     Plan  1.  Continue Lasix 40 mg and her Aldactone 100 mg/day.  Complete paperwork for Xifaxan .suggested to be compliant with lactulose for hepatic encephalopathy.  Increase dose of lactulose to titrate to 2  soft bowel movements per day.  1 week samples of Xifaxan have been provided.  I strongly explained to him that unless he is compliant with his Xifaxan his hepatic encephalopathy will not get better.  I advised him to call us if he does not hear back from his insurance about coverage. 2.  Right upper quadrant ultrasound to screen for [facility] in March 2023.  Check labs to calculate MELD score 3.  EGD to screen for varices in 2023 December  4.  Low-salt diet advised to stop 5.  Lasix and Aldactone continue at present dose     [name]  MD,MRCP [facility]) Follow up in 3 weeks to ensure he has obtained his Xifaxan

## 2021-07-19 ENCOUNTER — Encounter: Payer: Self-pay | Admitting: Urology

## 2021-07-19 ENCOUNTER — Ambulatory Visit: Payer: Medicare HMO | Admitting: Urology

## 2021-07-19 VITALS — BP 95/53 | HR 72 | Ht 69.0 in | Wt 286.0 lb

## 2021-07-19 DIAGNOSIS — N3281 Overactive bladder: Secondary | ICD-10-CM | POA: Diagnosis not present

## 2021-07-19 DIAGNOSIS — B372 Candidiasis of skin and nail: Secondary | ICD-10-CM

## 2021-07-19 DIAGNOSIS — N5082 Scrotal pain: Secondary | ICD-10-CM

## 2021-07-19 LAB — CBC WITH DIFFERENTIAL/PLATELET
Basophils Absolute: 0.1 10*3/uL (ref 0.0–0.2)
Basos: 1 %
EOS (ABSOLUTE): 0.1 10*3/uL (ref 0.0–0.4)
Eos: 3 %
Hematocrit: 31.9 % — ABNORMAL LOW (ref 37.5–51.0)
Hemoglobin: 11.4 g/dL — ABNORMAL LOW (ref 13.0–17.7)
Immature Grans (Abs): 0 10*3/uL (ref 0.0–0.1)
Immature Granulocytes: 0 %
Lymphocytes Absolute: 0.8 10*3/uL (ref 0.7–3.1)
Lymphs: 19 %
MCH: 35.6 pg — ABNORMAL HIGH (ref 26.6–33.0)
MCHC: 35.7 g/dL (ref 31.5–35.7)
MCV: 100 fL — ABNORMAL HIGH (ref 79–97)
Monocytes Absolute: 0.4 10*3/uL (ref 0.1–0.9)
Monocytes: 9 %
Neutrophils Absolute: 3.1 10*3/uL (ref 1.4–7.0)
Neutrophils: 68 %
Platelets: 58 10*3/uL — CL (ref 150–450)
RBC: 3.2 x10E6/uL — ABNORMAL LOW (ref 4.14–5.80)
RDW: 13.5 % (ref 11.6–15.4)
WBC: 4.5 10*3/uL (ref 3.4–10.8)

## 2021-07-19 LAB — COMPREHENSIVE METABOLIC PANEL
ALT: 20 IU/L (ref 0–44)
AST: 39 IU/L (ref 0–40)
Albumin/Globulin Ratio: 0.8 — ABNORMAL LOW (ref 1.2–2.2)
Albumin: 3.1 g/dL — ABNORMAL LOW (ref 3.8–4.8)
Alkaline Phosphatase: 210 IU/L — ABNORMAL HIGH (ref 44–121)
BUN/Creatinine Ratio: 16 (ref 10–24)
BUN: 16 mg/dL (ref 8–27)
Bilirubin Total: 1 mg/dL (ref 0.0–1.2)
CO2: 21 mmol/L (ref 20–29)
Calcium: 8.5 mg/dL — ABNORMAL LOW (ref 8.6–10.2)
Chloride: 103 mmol/L (ref 96–106)
Creatinine, Ser: 1 mg/dL (ref 0.76–1.27)
Globulin, Total: 3.7 g/dL (ref 1.5–4.5)
Glucose: 150 mg/dL — ABNORMAL HIGH (ref 70–99)
Potassium: 4.7 mmol/L (ref 3.5–5.2)
Sodium: 136 mmol/L (ref 134–144)
Total Protein: 6.8 g/dL (ref 6.0–8.5)
eGFR: 83 mL/min/{1.73_m2} (ref >59)

## 2021-07-19 LAB — PROTIME-INR
INR: 1.1 (ref 0.9–1.2)
Prothrombin Time: 11.9 s (ref 9.1–12.0)

## 2021-07-19 MED ORDER — SULFAMETHOXAZOLE-TRIMETHOPRIM 800-160 MG PO TABS: 1.0000 | ORAL_TABLET | Freq: Two times a day (BID) | ORAL | 0 refills | Status: DC

## 2021-07-19 MED ORDER — NYSTATIN 100000 UNIT/GM EX CREA: TOPICAL_CREAM | CUTANEOUS | 1 refills | Status: DC

## 2021-07-19 NOTE — Progress Notes (Signed)
° °  07/19/2021 1:02 PM   [name] 03-20-1956 [id number]  Reason for visit: Scrotal lesions, overactive bladder  HPI: I saw Mr. Gandolfo and his wife today for the above issues.  She provides most of the history.  He is an extremely comorbid 66 year old male with morbid obesity, AAA repair, cirrhosis, coronary artery disease, chronic pain on narcotics, and hidradenitis.  I have previously followed him for urinary symptoms and scrotal pain, and have recommended behavioral strategies with his high-dose Lasix, high intake of soda, and small prostate with negative hematuria work-up in 2018.  His wife made a appointment for him today because over the last 2 weeks she has noticed some redness and swelling in the left scrotum.  This has happened before with his hidradenitis.  He denies any fevers, chills, or significant scrotal pain at this time.  On exam there is some mild erythema and tenderness in the left scrotum, but no evidence of open wounds, induration, fluctuance consistent with abscess.  No fever in clinic today.  He denies any urinary symptoms aside from his baseline overactivity.  -Recommended a course of Bactrim DS twice daily x5 days for possible early scrotal cellulitis/hidradenitis flare -Behavioral strategies reviewed again regarding OAB symptoms, could consider Myrbetriq in the future  Billey Co, MD  [facility] [address], [place], [place] [address] [phone]

## 2021-07-20 NOTE — Telephone Encounter (Signed)
Left message on voicemail requesting patient's wife to call the office back.

## 2021-07-21 ENCOUNTER — Telehealth: Payer: Self-pay

## 2021-07-21 NOTE — Progress Notes (Signed)
Chronic Care Management Pharmacy Assistant   Name: [name]  MRN: [id number] DOB: March 04, 1956  Reason for Encounter: CCM (Appointment Reminder)  Medications: Outpatient Encounter Medications as of 07/21/2021  Medication Sig   acetaminophen (TYLENOL) 500 MG tablet Take 1 tablet (500 mg total) by mouth in the morning, at noon, and at bedtime.   albuterol (PROVENTIL) (2.5 MG/3ML) 0.083% nebulizer solution USE 1 VIAL PER NEBULIZER EVERY 6 HRS AS NEEDED FOR WHEEZING   ARIPiprazole (ABILIFY) 2 MG tablet TAKE 1 TABLET BY MOUTH EVERY DAY   aspirin 81 MG tablet Take 1 tablet (81 mg total) by mouth daily.   B Complex-C (SUPER B COMPLEX PO) Take 1 tablet by mouth daily.   Ca Phosphate-Cholecalciferol [phone] MG-UNIT TABS Take 1 tablet by mouth daily.    cholecalciferol 2000 units TABS Take 2,000 Units by mouth daily.   DULoxetine (CYMBALTA) 60 MG capsule Take 1 capsule (60 mg total) by mouth daily.   fentaNYL (DURAGESIC) 50 MCG/HR Place 1 patch onto the skin every 3 (three) days.   ferrous sulfate 325 (65 FE) MG tablet Take 1 tablet (325 mg total) by mouth every other day.   fluticasone (FLONASE) 50 MCG/ACT nasal spray Place 2 sprays into both nostrils daily as needed for allergies.    folic acid (FOLVITE) 1 MG tablet TAKE 1 TABLET BY MOUTH EVERY DAY   furosemide (LASIX) 40 MG tablet Take 1 tablet (40 mg total) by mouth daily.   gabapentin (NEURONTIN) 300 MG capsule Take 1 capsule (300 mg total) by mouth in the morning and at bedtime.   Lactulose 20 GM/30ML SOLN Take 30 mLs (20 g total) by mouth 3 (three) times daily.   losartan (COZAAR) 25 MG tablet Take 1 tablet (25 mg total) by mouth daily.   metoprolol succinate (TOPROL-XL) 25 MG 24 hr tablet Take 1 tablet (25 mg total) by mouth daily.   Misc Natural Products (TART CHERRY ADVANCED) CAPS Take 1 capsule by mouth 2 (two) times daily.   nitroGLYCERIN (NITROSTAT) 0.4 MG SL tablet Place 1 tablet (0.4 mg total) under the tongue every 5  (five) minutes as needed for chest pain.   nystatin cream (MYCOSTATIN) APPLY TO AFFECTED AREA TWICE A DAY   omeprazole (PRILOSEC) 40 MG capsule TAKE 1 CAPSULE BY MOUTH EVERY DAY   ondansetron (ZOFRAN) 4 MG tablet TAKE 1 TABLET BY MOUTH EVERY 8 HOURS AS NEEDED FOR NAUSEA AND VOMITING   Red Yeast Rice 600 MG CAPS Take 2 capsules by mouth daily.   Respiratory Therapy Supplies (NEBULIZER) DEVI Use as instructed for albuterol nebulization, with necessary tubing   rifaximin (XIFAXAN) 550 MG TABS tablet Take 1 tablet (550 mg total) by mouth 2 (two) times daily.   spironolactone (ALDACTONE) 100 MG tablet Take 1 tablet (100 mg total) by mouth daily.   sulfamethoxazole-trimethoprim (BACTRIM DS) 800-160 MG tablet Take 1 tablet by mouth 2 (two) times daily.   vitamin B-12 (CYANOCOBALAMIN) 500 MCG tablet Take 1 tablet (500 mcg total) by mouth every Monday, Wednesday, and Friday.   No facility-administered encounter medications on file as of 07/21/2021.   [name] was contacted to remind of upcoming telephone visit with [name] on 07/26/2021 at 12:00 pm. Patient was reminded to have all medications, supplements and any blood glucose and blood pressure readings available for review at appointment. If unable to reach, a voicemail was left for patient.   Star Rating Drugs: Medication:  Last Fill: Day Supply Losartan 25 mg 05/05/2021  [name], CPP notified  [name], [place] [phone]  Time Spent: 10 Minutes

## 2021-07-22 ENCOUNTER — Ambulatory Visit: Payer: Medicare HMO

## 2021-07-22 NOTE — Telephone Encounter (Signed)
Lvm asking pt/pt's wife, [name] (on dpr) to call back.  Need to relay Dr. Synthia Innocent message.

## 2021-07-26 ENCOUNTER — Telehealth: Payer: Medicare HMO

## 2021-07-26 ENCOUNTER — Telehealth: Payer: Self-pay

## 2021-07-26 NOTE — Telephone Encounter (Signed)
°  Chronic Care Management   Outreach Note  07/26/2021 Name: NAZIM KADLEC MRN: [id number] DOB: 04/07/1956  Referred by: Ria Bush, MD Reason for referral : Chronic Care Management  Contacted patient for CCM follow up visit today. Reached patient wife. They denied any medication concerns or need for CCM. Reviewed upcoming appt times - April 24 with PCP 11 AM. Will unenroll from CCM effective 08/02/2021.  Debbora Dus, PharmD Clinical Pharmacist Practitioner [facility] [phone]

## 2021-07-26 NOTE — Telephone Encounter (Signed)
Spoke with pt's wife, [name], relaying Dr. Synthia Innocent message.  Verbalizes understanding and states SW issues have been resolved for right now.

## 2021-07-26 NOTE — Progress Notes (Unsigned)
Chronic Care Management Pharmacy Note  12/27/20 Name:  [name] MRN:  [id number] DOB:  1955-07-30  Subjective: [name] is an 66 y.o. year old male who is a primary patient of Ria Bush, MD.  The CCM team was consulted for assistance with disease management and care coordination needs.    Engaged with patient by telephone for initial visit in response to provider referral for pharmacy case management and/or care coordination services. Full medication review was not completed today due to caregiver preference. We focused on their concerns only. Spoke with patient and his wife.  Concerns: - Poor memory. Wife reports pt refuses to take the lactulose due to taste and running to the bathroom. We discussed this and I encouraged resuming as prescribed due to potential complications. He needs a new rx for lactulose. Will update GI.  -$285 rixafimin/month (COST) - unable to afford this and state they have not heard back from GI. Per GI notes 08/2020, they discussed PAP and patient would come in to sign. Pt is having a lot of N/V. Would like Zofran refill. Will update GI.   Consent to Services:  The patient was given the following information about Chronic Care Management services today, agreed to services, and gave verbal consent: 1. CCM service includes personalized support from designated clinical staff supervised by the primary care provider, including individualized plan of care and coordination with other care providers 2. 24/7 contact phone numbers for assistance for urgent and routine care needs. 3. Service will only be billed when office clinical staff spend 20 minutes or more in a month to coordinate care. 4. Only one practitioner may furnish and bill the service in a calendar month. 5.The patient may stop CCM services at any time (effective at the end of the month) by phone call to the office staff. 6. The patient will be responsible for cost sharing (co-pay) of up to  20% of the service fee (after annual deductible is met). Patient agreed to services and consent obtained.  Patient Care Team: Ria Bush, MD as PCP - General (Family Medicine) Love, Alyson Locket, MD as Consulting Physician (Neurology) Stanford Breed Denice Bors, MD as Consulting Physician (Cardiology) Druscilla Brownie, MD as Consulting Physician (Dermatology) Glennie Isle, PA-C as Consulting Physician (Physician Assistant) Elam Dutch, MD (Inactive) as Consulting Physician (Vascular Surgery) Leeroy Cha, MD as Attending Physician (Neurosurgery) Leone Payor, MD as Referring Physician (Pain Medicine) Debbora Dus, [facility] as Pharmacist (Pharmacist)  Recent office visits:  12/03/20 - Dr. Danise Mina, PCP - Presents for hypertension, concern for memory loss. Check labs today. Reassess at follow up visit. Recommend healthy lifestyle, no smoking, limit alcohol, follow up 4 months. 08/03/20 - Dr. Danise Mina, PCP - Presents for AWV.  Blood pressure elevated. Increase Toprol XL to 25 mg daily.  Recent consult visits:  12/15/20 - Urology - Presents for urgency, frequency, urge incontinence, nocturia.  We discussed at length this is likely secondary to his high soda intake, Lasix, poor mobility, and obesity. Recommend behavioral strategies. F/u 1 year. 08/23/20 - Radiology - CT scan 08/16/20 - Gastroenterology - Presents for cirrhosis of liver/NASH.  Continue Aldactone 100, Lasix 40, lactulose titrated to 1-2 soft bowel movements per day. Start Xifaxan for hepatic encephalopathy.   Hospital visits:  09/09/20 thru 09/20/20 - [facility] 09/06/20 - [facility] - Right hip replacement  07/14/20 - [facility] - Fall, SOB    Objective:  Lab Results  Component Value Date  CREATININE 1.00 07/18/2021   BUN 16 07/18/2021   GFR 88.11 03/22/2021   GFRNONAA >60 07/14/2020   GFRAA 107 12/09/2019   NA 136 07/18/2021   K 4.7 07/18/2021   CALCIUM 8.5 (L) 07/18/2021   CO2 21  07/18/2021   GLUCOSE 150 (H) 07/18/2021    Lab Results  Component Value Date/Time   HGBA1C 4.9 07/30/2020 12:18 PM   HGBA1C 5.1 01/09/2020 03:04 PM   HGBA1C 4.8 12/10/2018 12:00 AM   GFR 88.11 03/22/2021 09:12 AM   GFR 91.36 12/03/2020 11:56 AM   MICROALBUR 1.8 05/06/2012 09:12 AM   MICROALBUR 2.3 (H) 02/14/2011 09:42 AM    Lab Results  Component Value Date   CHOL 143 07/30/2020   HDL 38.00 (L) 07/30/2020   LDLCALC 88 07/30/2020   LDLDIRECT 128.0 05/06/2012   TRIG 87.0 07/30/2020   CHOLHDL 4 07/30/2020    Hepatic Function Latest Ref Rng & Units 07/18/2021 03/22/2021 12/03/2020  Total Protein 6.0 - 8.5 g/dL 6.8 7.0 7.0  Albumin 3.8 - 4.8 g/dL 3.1(L) 2.9(L) 2.7(L)  AST 0 - 40 IU/L 39 27 23  ALT 0 - 44 IU/L 20 14 11   Alk Phosphatase 44 - 121 IU/L 210(H) 173(H) 197(H)  Total Bilirubin 0.0 - 1.2 mg/dL 1.0 1.5(H) 1.0  Bilirubin, Direct 0.00 - 0.40 mg/dL - - -    Lab Results  Component Value Date/Time   TSH 1.65 07/30/2020 12:18 PM   TSH 1.88 01/09/2020 03:04 PM    CBC Latest Ref Rng & Units 07/18/2021 03/22/2021 12/03/2020  WBC 3.4 - 10.8 x10E3/uL 4.5 5.6 3.1(L)  Hemoglobin 13.0 - 17.7 g/dL 11.4(L) 11.8(L) 10.8(L)  Hematocrit 37.5 - 51.0 % 31.9(L) 34.8(L) 31.1(L)  Platelets 150 - 450 x10E3/uL 58(LL) 45.0 Repeated and verified X2.(LL) 43.0(LL)    Lab Results  Component Value Date/Time   VD25OH 25.65 (L) 07/30/2020 12:18 PM   VD25OH 14 (L) 01/09/2020 03:04 PM   VD25OH 20.7 12/10/2018 12:00 AM   VD25OH 16.00 (L) 11/15/2017 11:00 AM    Clinical ASCVD: Yes  The ASCVD Risk score (Arnett DK, et al., 2019) failed to calculate for the following reasons:   The patient has a prior MI or stroke diagnosis    Depression screen [facility] 2/9 08/03/2020 11/21/2018 03/14/2018  Decreased Interest 0 3 0  Down, Depressed, Hopeless 3 1 0  PHQ - 2 Score 3 4 0  Altered sleeping 3 3 -  Tired, decreased energy 3 3 -  Change in appetite 2 3 -  Feeling bad or failure about yourself  2 0 -   Trouble concentrating 3 0 -  Moving slowly or fidgety/restless 3 0 -  Suicidal thoughts 0 0 -  PHQ-9 Score 19 13 -  Difficult doing work/chores - Not difficult at all -  Some recent data might be hidden    Social History   Tobacco Use  Smoking Status Every Day   Packs/day: 1.00   Years: 57.00   Pack years: 57.00   Types: Cigarettes, E-cigarettes   Start date: 06/06/1967   Last attempt to quit: 07/21/2019   Years since quitting: 2.0  Smokeless Tobacco Never  Tobacco Comments   stopped smoking a pipe in 2015  DOES SMOKE E CIG   BP Readings from Last 3 Encounters:  07/19/21 (!) 95/53  07/18/21 138/88  06/27/21 126/82   Pulse Readings from Last 3 Encounters:  07/19/21 72  07/18/21 (!) 103  06/27/21 75   Wt Readings from Last 3 Encounters:  07/19/21 286 lb (129.7 kg)  07/18/21 281 lb (127.5 kg)  06/27/21 275 lb (124.7 kg)   BMI Readings from Last 3 Encounters:  07/19/21 42.23 kg/m  07/18/21 41.50 kg/m  06/27/21 40.61 kg/m    Assessment/Interventions: Review of patient past medical history, allergies, medications, health status, including review of consultants reports, laboratory and other test data, was performed as part of comprehensive evaluation and provision of chronic care management services.   SDOH:  (Social Determinants of Health) assessments and interventions performed: Yes   SDOH Screenings   Alcohol Screen: Not on file  Depression (PHQ2-9): Medium Risk   PHQ-2 Score: 19  Financial Resource Strain: High Risk   Difficulty of Paying Living Expenses: Hard  Food Insecurity: Not on file  Housing: Not on file  Physical Activity: Not on file  Social Connections: Not on file  Stress: Not on file  Tobacco Use: High Risk   Smoking Tobacco Use: Every Day   Smokeless Tobacco Use: Never   Passive Exposure: Not on file  Transportation Needs: Not on file    CCM Care Plan  Allergies  Allergen Reactions   Statins Shortness Of Breath    Cough, trouble  breathing Cough, trouble breathing   Losartan Other (See Comments)    Causes him to have pain   Wellbutrin [Bupropion] Other (See Comments)    Worsened mood - crying   Allopurinol Nausea Only   Baclofen Nausea And Vomiting   Penicillins Nausea And Vomiting    Did it involve swelling of the face/tongue/throat, SOB, or low BP? N/A Did it involve sudden or severe rash/hives, skin peeling, or any reaction on the inside of your mouth or nose? N/A Did you need to seek medical attention at a hospital or doctor's office? N/A When did it last happen? Child     If all above answers are "NO", may proceed with cephalosporin use.   Tramadol Nausea Only    Medications Reviewed Today     Reviewed by Gordy Clement, [place] (Certified Medical Assistant) on 07/19/21 at 1146  Med List Status: <None>   Medication Order Taking? Sig Documenting Provider Last Dose Status Informant  acetaminophen (TYLENOL) 500 MG tablet [id number] Yes Take 1 tablet (500 mg total) by mouth in the morning, at noon, and at bedtime. Ria Bush, MD Taking Active   albuterol (PROVENTIL) (2.5 MG/3ML) 0.083% nebulizer solution [id number] Yes USE 1 VIAL PER NEBULIZER EVERY 6 HRS AS NEEDED FOR WHEEZING Ria Bush, MD Taking Active   ARIPiprazole (ABILIFY) 2 MG tablet [id number] Yes TAKE 1 TABLET BY MOUTH EVERY DAY Ria Bush, MD Taking Active   aspirin 81 MG tablet [id number] Yes Take 1 tablet (81 mg total) by mouth daily. Lelon Perla, MD Taking Active Self  B Complex-C (SUPER B COMPLEX PO) [id number] Yes Take 1 tablet by mouth daily. [provider] Taking Active Self  Ca Phosphate-Cholecalciferol [phone] MG-UNIT TABS [id number] Yes Take 1 tablet by mouth daily.  [provider] Taking Active Self  cholecalciferol 2000 units TABS [id number] Yes Take 2,000 Units by mouth daily. Ria Bush, MD Taking Active Self  DULoxetine (CYMBALTA) 60 MG capsule [id number] Yes Take 1 capsule (60 mg total) by  mouth daily. Ria Bush, MD Taking Active   fentaNYL ([place]) 50 MCG/HR [id number] Yes Place 1 patch onto the skin every 3 (three) days. Ria Bush, MD Taking Active   ferrous sulfate 325 (65 FE) MG tablet [id number] Yes Take 1 tablet (325 mg total) by mouth every other day.  Ria Bush, MD Taking Active   fluticasone [facility]) 50 MCG/ACT nasal spray [id number] Yes Place 2 sprays into both nostrils daily as needed for allergies.  Ria Bush, MD Taking Active Self  folic acid (FOLVITE) 1 MG tablet [id number] Yes TAKE 1 TABLET BY MOUTH EVERY DAY Ria Bush, MD Taking Active   furosemide (LASIX) 40 MG tablet [id number] Yes Take 1 tablet (40 mg total) by mouth daily. [name], MD Taking Active   gabapentin (NEURONTIN) 300 MG capsule [id number] Yes Take 1 capsule (300 mg total) by mouth in the morning and at bedtime. Ria Bush, MD Taking Active   Lactulose 20 GM/30ML SOLN [id number] Yes Take 30 mLs (20 g total) by mouth 3 (three) times daily. [name], MD Taking Active   losartan (COZAAR) 25 MG tablet [id number] Yes Take 1 tablet (25 mg total) by mouth daily. Lelon Perla, MD Taking Active   metoprolol succinate (TOPROL-XL) 25 MG 24 hr tablet [id number] Yes Take 1 tablet (25 mg total) by mouth daily. Ria Bush, MD Taking Active   Misc Natural Products [facility] AD[facility]ANCED) CAPS [id number] Yes Take 1 capsule by mouth 2 (two) times daily. [provider] Taking Active Self           Med Note ([place], [place]   Mon Mar 24, 2019  4:18 PM)    nitroGLYCERIN (NITROSTAT) 0.4 MG SL tablet [id number] Yes Place 1 tablet (0.4 mg total) under the tongue every 5 (five) minutes as needed for chest pain. Lelon Perla, MD Taking Active Self  nystatin cream Royden Purl) [id number] Yes APPLY TO AFFECTED AREA TWICE A Velora Heckler, MD Taking Active   omeprazole (PRILOSEC) 40 MG capsule [id number] Yes TAKE 1 CAPSULE BY MOUTH EVERY DAY Ria Bush, MD Taking Active   ondansetron (ZOFRAN) 4 MG tablet [id number] Yes TAKE 1 TABLET BY MOUTH EVERY 8 HOURS AS NEEDED FOR NAUSEA AND VOMITING [name], MD Taking Active   Red Yeast Rice 600 MG CAPS [id number] Yes Take 2 capsules by mouth daily. [provider] Taking Active Self  Respiratory Therapy Supplies (NEBULIZER) DEVI [id number] Yes Use as instructed for albuterol nebulization, with necessary tubing Ria Bush, MD Taking Active   rifaximin (XIFAXAN) 550 MG TABS tablet [id number] Yes Take 1 tablet (550 mg total) by mouth 2 (two) times daily. [name], MD Taking Active   spironolactone (ALDACTONE) 100 MG tablet [id number] Yes Take 1 tablet (100 mg total) by mouth daily. [name], MD Taking Active   vitamin B-12 (CYANOCOBALAMIN) 500 MCG tablet [id number] Yes Take 1 tablet (500 mcg total) by mouth every Monday, Wednesday, and Friday. Ria Bush, MD Taking Active Self            Patient Active Problem List   Diagnosis Date Noted   Cognitive safety issue 06/27/2021   Status post right hip replacement 12/04/2020   Carotid stenosis, asymptomatic, bilateral 08/28/2020   Statin intolerance 08/05/2020   Mass of both parotid glands 08/05/2020   Memory deficit 01/10/2020   Pancytopenia ([place]) 09/18/2018   Hearing loss 08/07/2018   Abdominal aortic aneurysm (AAA) without rupture 01/02/2018   History of MRSA infection 01/02/2018   History of myocardial infarction 01/02/2018   Aortic atherosclerosis ([place]) 01/01/2018   MDD (major depressive disorder), recurrent severe, without psychosis ([place]) 11/17/2017   Candidal intertrigo 05/08/2017   Recurrent falls 03/13/2017   Abnormal drug screen 07/09/2016   Thrombocytopenia ([place]) 04/29/2016   Anemia 04/29/2016   Protein-calorie malnutrition ([place]) 04/29/2016  RUQ abdominal pain 04/26/2016   Encounter for chronic pain management 01/04/2016   Preop cardiovascular exam 09/01/2014   Chronic fatigue 07/22/2014    Esophageal dysphagia 07/22/2014   Gross hematuria 05/19/2014   Encounter for general adult medical examination with abnormal findings 04/21/2014   Advanced care planning/counseling discussion 04/21/2014   Orthostatic hypotension 03/13/2014   Cirrhosis of liver with ascites ([place]) 01/03/2014   Osteoarthritis of right hip 08/16/2012   Medicare annual wellness visit, subsequent 06/07/2012   DOE (dyspnea on exertion) 10/06/2011   DDD (degenerative disc disease), cervical    Vitamin D deficiency 03/14/2011   Vitamin B12 deficiency 08/05/2010   Prediabetes 06/27/2010   OSA (obstructive sleep apnea) 05/16/2010   Cervical spondylosis 05/05/2010   Obesity, morbid, BMI 40.0-49.9 ([place]) 05/03/2010   Hidradenitis 05/03/2010   Smoker 10/21/2009   HLD (hyperlipidemia) 09/07/2009   Gout 09/07/2009   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   Essential hypertension 09/07/2009   CAD (coronary artery disease) 09/07/2009   CARDIOMYOPATHY 09/07/2009   COPD (chronic obstructive pulmonary disease) ([place]) 09/07/2009   Chronic pain syndrome 09/07/2009    Immunization History  Administered Date(s) Administered   Fluad Quad(high Dose 65+) 07/19/2020   Hepatitis B 06/05/1988, 08/03/1988, 11/03/1988   Hepatitis B, adult 03/27/2018   Influenza Split 05/11/2011, 04/23/2012   Influenza Whole 06/05/2005, 06/05/2008, 05/03/2010   Influenza, High Dose Seasonal PF 02/20/2021   Influenza,inj,Quad PF,6+ Mos 03/13/2013, 04/21/2014, 04/05/2015, 04/26/2016, 03/26/2019   PFIZER Comirnaty(Gray Top)Covid-19 Tri-Sucrose Vaccine 07/19/2020   PFIZER(Purple Top)SARS-COV-2 Vaccination 08/29/2019, 09/23/2019   Pfizer Covid-19 Vaccine Bivalent Booster 46yr & up 02/20/2021   Pneumococcal Conjugate-13 08/03/2020   Pneumococcal Polysaccharide-23 06/05/2005   Td 06/05/2005    Conditions to be addressed/monitored:  Hypertension, Hyperlipidemia, Coronary Artery Disease, COPD, Depression, Osteoarthritis, and Tobacco use  There  are no care plans that you recently modified to display for this patient.  Patient Care Plan: CCM Pharmacy Care Plan     Problem Identified: Disease Management   Priority: High  Onset Date: 12/31/2020  Note:   Current Barriers:  Patient does not adhere to prescribed regimen or dietary changes per wife's report  Pharmacist Clinical Goal(s):  Patient will contact provider office for questions/concerns as evidenced notation of same in electronic health record through collaboration with PharmD and provider.   Interventions: 1:1 collaboration with GRia Bush MD regarding development and update of comprehensive plan of care as evidenced by provider attestation and co-signature Inter-disciplinary care team collaboration (see longitudinal plan of care) Comprehensive medication review performed; medication list updated in electronic medical record  Hypertension (BP goal <130/80) -Controlled - per clinic readings -Comorbid cirrhosis of the liver with ascites, followed by GI -Current treatment: Metoprolol succinate 25 mg - 1 tablet daily Irbesartan 75 mg - 1 tablet daily Spironolactone 100 mg - 1 tablet daily  Furosemide 40 mg - 1 tablet daily -Medications previously tried: none  -Current home readings: checks BP on occasion, declined to report today -Denies hypotensive/hypertensive symptoms -Recommended to continue current medication  Hyperlipidemia: (LDL goal < 70) -Uncontrolled - LDL 88 -Current treatment: Red Yeast Rice 600 mg - 2 capsule daily -Medications previously tried: atorvastatin  -Current dietary patterns: wife reports very poor diet -Current exercise habits: minimal -Recommended to continue current medication   Patient Goals/Self-Care Activities Patient will:  - take medications as prescribed - alert CCM pharmacist with any medication concerns  Follow Up Plan: Telephone follow up appointment with care management team member scheduled for:  3-6 months, will  have CMA contact  to see if patient has any medication needs and if willing to schedule f/u appt at that time        Medication Assistance:  Contacted GI by MyChart for help with rifaximin cost  Compliance/Adherence/Medication fill history: Care Gaps: Shingrix Colonoscopy COVID-19 booster  Star-Rating Drugs: Medication:                Last Fill:         Day Supply Irbesartan 87m          10/07/20              90   Patient's preferred pharmacy [id number] [id number]: [phone]: [phone] pill box? Yes - wife fills pillbox, but patient chooses what he wants to do per her report  He does not like to take meds in mornings. Takes on his own time. His skips a day about once a month. Takes all at same time. Not concerned about adverse side effects just does not like to take them.  We discussed: Benefits of medication synchronization, packaging and delivery as well as enhanced pharmacist oversight with [facility]. Patient decided to: Continue current medication management strategy  Care Plan and Follow Up Patient Decision:  Patient agrees to Care Plan and Follow-up.  MiDebbora DusPharmD Clinical Pharmacist [phone]

## 2021-08-03 ENCOUNTER — Other Ambulatory Visit: Payer: Self-pay | Admitting: Family Medicine

## 2021-08-08 ENCOUNTER — Telehealth: Payer: Self-pay

## 2021-08-08 ENCOUNTER — Ambulatory Visit: Payer: Medicare HMO

## 2021-08-08 NOTE — Telephone Encounter (Signed)
Would offer social worker referral to start placement discussion. They would need to find SNF for him and then I would fill out FL2.  ? ?Did he get his xifaxan recommended by GI?  ?Did they call [facility] to schedule psych appointment? ?

## 2021-08-08 NOTE — Telephone Encounter (Signed)
Patient's wife is calling about patient refusing care. She reports he has only taken 3 showers this year. Patient was scheduling twice for ultrasound but refuse to go. She states he refusing to be seen by any provider. She is needing  assistance in maybe putting in somewhere.

## 2021-08-09 ENCOUNTER — Ambulatory Visit: Payer: Medicare HMO | Admitting: Gastroenterology
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
## 2021-08-09 NOTE — Progress Notes (Unsigned)
Miguel Bellows MD, MRCP(U.K) [address]  [place], [place] [address]  Main: [phone]  Fax: [phone]   Primary Care Physician: Ria Bush, MD  Primary Gastroenterologist:  Dr. Jonathon Ware   No chief complaint on file.   HPI: [name] is a 66 y.o. male  Summary of history :   He was initially referred and seen on 02/13/18 for cirrhosis of the liver . He has been seen by [facility] in the past.. His Cirrhosis had been attriuted to NASH. Used cocaine in his 20's, none recently. Weighed 370 lbs at his heaviest. Was always " big", he is not diabetic.      CT angio of the abdomen 12/2017:Post endovascular repair of suprarenal abdominal aortic aneurysm without evidence of complication .Suspected hemodynamically significant stenosis involving the origin of the common hepatic artery, similar to the 12/2015 examination.Potential hemodynamically significant narrowing involving theproximal aspect of the right SFA.Findings of hepatic cirrhosis and portal venous hypertension with several varices about the gastroesophageal junction, splenomegaly and trace amount of intra-abdominal ascites. No discrete hyperenhancing hepatic lesions   02/13/18 : Cr 1.03,Hep A ab positive , albumin 3.0 ,alk phos 149 , Hbsab/ag,HCV ab , ANA,negative .Ceruloplasmin ,alpha 1 AT,Iron studies - normal    03/19/18 : liver biopsy : steatosis , focal plasma cells, mild chronic cholestasis . Unmable to get a diagnosis due to burned out liver disease.     05/12/2019: EGD: Normal    Smooth muscle antibody positive , elevated immunoglobulins G, AMA,LKM ab  negative Hospital admission in 03/26/2019 for ascites, underwent paracentesis.   07/14/2020 CT scan of the chest abdomen pelvis with contrast shows findings consistent with cirrhosis and portal hypertension stable common bile duct dilation since 2020      Interval history 07/18/2021-08/09/2021     Gained 9 pounds since last visit.   He was  recently started on Abilify and since then his appetite is decreased significantly recently has been eating a lot of junk food/processed foods that are thought.  He still has not received with Xifaxan states issues with the paperwork did not call our office regularly after that.  On lactulose but only takes it once a day unclear how many bowel movements he has had he has been confused on and off.  Family members been very concerned about it.      Current Outpatient Medications  Medication Sig Dispense Refill   acetaminophen (TYLENOL) 500 MG tablet Take 1 tablet (500 mg total) by mouth in the morning, at noon, and at bedtime.     albuterol (PROVENTIL) (2.5 MG/3ML) 0.083% nebulizer solution USE 1 VIAL PER NEBULIZER EVERY 6 HRS AS NEEDED FOR WHEEZING 75 mL 6   ARIPiprazole (ABILIFY) 2 MG tablet TAKE 1 TABLET BY MOUTH EVERY DAY 90 tablet 1   aspirin 81 MG tablet Take 1 tablet (81 mg total) by mouth daily.     B Complex-C (SUPER B COMPLEX PO) Take 1 tablet by mouth daily.     Ca Phosphate-Cholecalciferol [phone] MG-UNIT TABS Take 1 tablet by mouth daily.      cholecalciferol 2000 units TABS Take 2,000 Units by mouth daily.     DULoxetine (CYMBALTA) 60 MG capsule Take 1 capsule (60 mg total) by mouth daily. 90 capsule 3   fentaNYL (DURAGESIC) 50 MCG/HR Place 1 patch onto the skin every 3 (three) days. 10 patch 0   ferrous sulfate 325 (65 FE) MG tablet Take 1 tablet (325 mg total) by  mouth every other day.     fluticasone (FLONASE) 50 MCG/ACT nasal spray Place 2 sprays into both nostrils daily as needed for allergies.      folic acid (FOLVITE) 1 MG tablet TAKE 1 TABLET BY MOUTH EVERY DAY 90 tablet 0   furosemide (LASIX) 40 MG tablet Take 1 tablet (40 mg total) by mouth daily. 90 tablet 1   gabapentin (NEURONTIN) 300 MG capsule Take 1 capsule (300 mg total) by mouth in the morning and at bedtime.     Lactulose 20 GM/30ML SOLN Take 30 mLs (20 g total) by mouth 3 (three) times daily. 2700 mL 5    losartan (COZAAR) 25 MG tablet Take 1 tablet (25 mg total) by mouth daily. 90 tablet 3   metoprolol succinate (TOPROL-XL) 25 MG 24 hr tablet Take 1 tablet (25 mg total) by mouth daily. 90 tablet 3   Misc Natural Products (TART CHERRY ADVANCED) CAPS Take 1 capsule by mouth 2 (two) times daily.     nitroGLYCERIN (NITROSTAT) 0.4 MG SL tablet Place 1 tablet (0.4 mg total) under the tongue every 5 (five) minutes as needed for chest pain. 25 tablet 12   nystatin cream (MYCOSTATIN) APPLY TO AFFECTED AREA TWICE A DAY 30 g 1   omeprazole (PRILOSEC) 40 MG capsule TAKE 1 CAPSULE BY MOUTH EVERY DAY 90 capsule 1   ondansetron (ZOFRAN) 4 MG tablet TAKE 1 TABLET BY MOUTH EVERY 8 HOURS AS NEEDED FOR NAUSEA AND VOMITING 30 tablet 1   Red Yeast Rice 600 MG CAPS Take 2 capsules by mouth daily.     Respiratory Therapy Supplies (NEBULIZER) DEVI Use as instructed for albuterol nebulization, with necessary tubing 1 each 0   rifaximin (XIFAXAN) 550 MG TABS tablet Take 1 tablet (550 mg total) by mouth 2 (two) times daily. 60 tablet 11   spironolactone (ALDACTONE) 100 MG tablet Take 1 tablet (100 mg total) by mouth daily. 90 tablet 1   sulfamethoxazole-trimethoprim (BACTRIM DS) 800-160 MG tablet Take 1 tablet by mouth 2 (two) times daily. 14 tablet 0   vitamin B-12 (CYANOCOBALAMIN) 500 MCG tablet Take 1 tablet (500 mcg total) by mouth every Monday, Wednesday, and Friday.     No current facility-administered medications for this visit.    Allergies as of 08/09/2021 - Review Complete 07/19/2021  Allergen Reaction Noted   Statins Shortness Of Breath 01/22/2012   Losartan Other (See Comments) 01/30/2013   Wellbutrin [bupropion] Other (See Comments) 08/03/2020   Allopurinol Nausea Only 12/18/2012   Baclofen Nausea And Vomiting 06/23/2015   Penicillins Nausea And Vomiting 02/20/2018   Tramadol Nausea Only 03/13/2017    ROS:  General: Negative for anorexia, weight loss, fever, chills, fatigue, weakness. ENT: Negative  for hoarseness, difficulty swallowing , nasal congestion. CV: Negative for chest pain, angina, palpitations, dyspnea on exertion, peripheral edema.  Respiratory: Negative for dyspnea at rest, dyspnea on exertion, cough, sputum, wheezing.  GI: See history of present illness. GU:  Negative for dysuria, hematuria, urinary incontinence, urinary frequency, nocturnal urination.  Endo: Negative for unusual weight change.    Physical Examination:   There were no vitals taken for this visit.  General: Well-nourished, well-developed in no acute distress.  Eyes: No icterus. Conjunctivae pink. Mouth: Oropharyngeal mucosa moist and pink , no lesions erythema or exudate. Lungs: Clear to auscultation bilaterally. Non-labored. Heart: Regular rate and rhythm, no murmurs rubs or gallops.  Abdomen: Bowel sounds are normal, nontender, nondistended, no hepatosplenomegaly or masses, no abdominal bruits or hernia , no  rebound or guarding.   Extremities: No lower extremity edema. No clubbing or deformities. Neuro: Alert and oriented x 3.  Grossly intact. Skin: Warm and dry, no jaundice.   Psych: Alert and cooperative, normal mood and affect.   Imaging Studies: No results found.  Assessment and Plan:   VIRL COBLE is a 66 y.o. y/o male    here to follow up  for  liver cirrhosis likely secondary to [facility] with ascites . Autoimmune work up shows a positive smooth muscle ab and elevated immunoglobulins  .MELD 7 in 12/2020  Liver biopsy is inconclusive due to burnt out liver.  Does have features of overt hepatic encephalopathy.  Not obtained Xifaxan yet.   No prior evidence of esophageal varices.  He is immune to hepatitis A and B, received pneumococcal vaccination and March 2022, up-to-date with COVID-vaccine and flu shot     Plan  1.  Continue Lasix 40 mg and her Aldactone 100 mg/day.  Complete paperwork for Xifaxan .suggested to be compliant with lactulose for hepatic encephalopathy.  Increase dose of  lactulose to titrate to 2 soft bowel movements per day.  1 week samples of Xifaxan have been provided.  I strongly explained to him that unless he is compliant with his Xifaxan his hepatic encephalopathy will not get better.  I advised him to call us if he does not hear back from his insurance about coverage. 2.  Right upper quadrant ultrasound to screen for [facility] in March 2023.  Check labs to calculate MELD score 3.  EGD to screen for varices in 2023 December  4.  Low-salt diet advised to stop 5.  Lasix and Aldactone continue at present dose       Dr Miguel Bellows  MD,MRCP [facility]) Follow up in ***

## 2021-08-10 ENCOUNTER — Ambulatory Visit: Payer: Medicare HMO | Attending: Gastroenterology

## 2021-08-10 ENCOUNTER — Telehealth: Payer: Self-pay | Admitting: Gastroenterology

## 2021-08-10 NOTE — Telephone Encounter (Signed)
Pt was know show for ultrasound this morning [facility] ?

## 2021-08-12 NOTE — Telephone Encounter (Signed)
Patient's wife notified as instructed by telephone and verbalized understanding. ?

## 2021-08-12 NOTE — Telephone Encounter (Addendum)
I'm sorry about the situation she's in.  ?Agree with recommendations to get police involved as she feels unsafe. If she decides to leave, I can ask APS to get involved.  ?I don't know what else we can do for patient if he refuses to help himself.  ?

## 2021-08-23 ENCOUNTER — Telehealth: Payer: Self-pay | Admitting: Family Medicine

## 2021-08-23 NOTE — Telephone Encounter (Signed)
Patient's wife is calling to request the following: ? ?Can MD write something to say that the patient  is unable to safely drive a car "with his mind as it is" ? ?Requesting increase to 75 mg fentanyl, 50 mg does not help with pain ? ?Also asking about a contract and urine test with Dr. Darnell Level regarding medicaiton? ? ?Wife asks for a callback at [phone] ?

## 2021-08-24 NOTE — Telephone Encounter (Signed)
Spoke with pt's wife, [name] (on dpr) asking about fentanyl strength increase and UDS and contract.  States the 50 mg of fentanyl is not helping pt so they're requesting the stronger dose.  Also, she wanted to confirm pt had signed a med contract and did a drug screening with Dr. Darnell Level.  I confirmed pt had completed both, last was 03/22/21.  She verbalizes understanding.  ?

## 2021-08-26 MED ORDER — FENTANYL 75 MCG/HR TD PT72
1.0000 | MEDICATED_PATCH | TRANSDERMAL | 0 refills | Status: DC
Start: 2021-08-26 — End: 2021-09-22

## 2021-08-26 NOTE — Telephone Encounter (Addendum)
Letter written and in [name]'s box.  ?Fentanyl 20mg sent to pharmacy to use 1 patch every 3 days.  ?

## 2021-08-26 NOTE — Addendum Note (Signed)
Addended by: Ria Bush on: 08/26/2021 06:29 AM ? ? Modules accepted: Orders ? ?

## 2021-08-26 NOTE — Telephone Encounter (Signed)
Spoke with pt's wife, [name], relaying Dr. Synthia Innocent message.  She verbalizes understanding, expresses her thanks and requests letter be mailed.  ? ?Mailed letter to pt.  ?

## 2021-09-13 ENCOUNTER — Other Ambulatory Visit: Payer: Self-pay | Admitting: Family Medicine

## 2021-09-14 NOTE — Telephone Encounter (Signed)
Refill request Duloxetine ?Last office visit 06/27/21 ?Last refill 08/03/20 #90/3 ?Upcoming appointment 09/27/21 ?See drug warning with Fentanyl ?

## 2021-09-15 NOTE — Telephone Encounter (Signed)
ERx 

## 2021-09-22 ENCOUNTER — Other Ambulatory Visit: Payer: Self-pay | Admitting: Family Medicine

## 2021-09-22 NOTE — Telephone Encounter (Signed)
?  Encourage patient to contact the pharmacy for refills or they can request refills through [facility] ? ?LAST APPOINTMENT DATE:  Please schedule appointment if longer than 1 year ? ?NEXT APPOINTMENT DATE: ? ?MEDICATION:fentaNYL (DURAGESIC) 75 MCG/HR ? ?Is the patient out of medication?  ? ?PHARMACY:[id number] [place], [place] - [id number]   ? ?Let patient know to contact pharmacy at the end of the day to make sure medication is ready. ? ?Please notify patient to allow 48-72 hours to process ? ?CLINICAL FILLS OUT ALL BELOW:  ? ?LAST REFILL: ? ?QTY: ? ?REFILL DATE: ? ? ? ?OTHER COMMENTS:  ? ? ?Okay for refill? ? ?Please advise ? ? ? ? ?

## 2021-09-22 NOTE — Telephone Encounter (Signed)
Name of Medication: Fentanyl patch ?Name of Pharmacy: [facility] ?Last Fill or Written Date and Quantity: 08/26/21, #10 patch ?Last Office Visit and Type: 06/27/21, 3 mo chronic pain f/u ?Next Office Visit and Type: 09/27/21, 3 mo chronic pain f/u ?Last Controlled Substance Agreement Date: 03/22/21 ?Last UDS: 03/22/21 ? ? ?

## 2021-09-23 MED ORDER — FENTANYL 75 MCG/HR TD PT72
1.0000 | MEDICATED_PATCH | TRANSDERMAL | 0 refills | Status: DC
Start: 2021-09-23 — End: 2021-10-18

## 2021-09-23 NOTE — Telephone Encounter (Signed)
ERx 

## 2021-09-26 ENCOUNTER — Other Ambulatory Visit: Payer: Self-pay | Admitting: Family Medicine

## 2021-09-27 ENCOUNTER — Ambulatory Visit: Payer: Medicare HMO | Admitting: Family Medicine

## 2021-10-04 ENCOUNTER — Telehealth: Payer: Self-pay

## 2021-10-04 ENCOUNTER — Telehealth: Payer: Self-pay | Admitting: Radiology

## 2021-10-04 ENCOUNTER — Encounter: Payer: Self-pay | Admitting: Family Medicine

## 2021-10-04 ENCOUNTER — Ambulatory Visit (INDEPENDENT_AMBULATORY_CARE_PROVIDER_SITE_OTHER): Payer: Medicare HMO | Admitting: Family Medicine

## 2021-10-04 VITALS — BP 122/84 | HR 96 | Temp 97.6°F | Ht 67.75 in | Wt 300.4 lb

## 2021-10-04 DIAGNOSIS — E785 Hyperlipidemia, unspecified: Secondary | ICD-10-CM

## 2021-10-04 DIAGNOSIS — F332 Major depressive disorder, recurrent severe without psychotic features: Secondary | ICD-10-CM | POA: Diagnosis not present

## 2021-10-04 DIAGNOSIS — F17200 Nicotine dependence, unspecified, uncomplicated: Secondary | ICD-10-CM | POA: Diagnosis not present

## 2021-10-04 DIAGNOSIS — Z0000 Encounter for general adult medical examination without abnormal findings: Secondary | ICD-10-CM

## 2021-10-04 DIAGNOSIS — E559 Vitamin D deficiency, unspecified: Secondary | ICD-10-CM | POA: Diagnosis not present

## 2021-10-04 DIAGNOSIS — I2510 Atherosclerotic heart disease of native coronary artery without angina pectoris: Secondary | ICD-10-CM | POA: Diagnosis not present

## 2021-10-04 DIAGNOSIS — G600 Hereditary motor and sensory neuropathy: Secondary | ICD-10-CM

## 2021-10-04 DIAGNOSIS — Z789 Other specified health status: Secondary | ICD-10-CM

## 2021-10-04 DIAGNOSIS — D696 Thrombocytopenia, unspecified: Secondary | ICD-10-CM | POA: Diagnosis not present

## 2021-10-04 DIAGNOSIS — E538 Deficiency of other specified B group vitamins: Secondary | ICD-10-CM

## 2021-10-04 DIAGNOSIS — R188 Other ascites: Secondary | ICD-10-CM | POA: Diagnosis not present

## 2021-10-04 DIAGNOSIS — R7303 Prediabetes: Secondary | ICD-10-CM | POA: Diagnosis not present

## 2021-10-04 DIAGNOSIS — L732 Hidradenitis suppurativa: Secondary | ICD-10-CM

## 2021-10-04 DIAGNOSIS — M1A9XX0 Chronic gout, unspecified, without tophus (tophi): Secondary | ICD-10-CM | POA: Diagnosis not present

## 2021-10-04 DIAGNOSIS — K7460 Unspecified cirrhosis of liver: Secondary | ICD-10-CM

## 2021-10-04 DIAGNOSIS — G894 Chronic pain syndrome: Secondary | ICD-10-CM | POA: Diagnosis not present

## 2021-10-04 DIAGNOSIS — H547 Unspecified visual loss: Secondary | ICD-10-CM

## 2021-10-04 DIAGNOSIS — I700 Atherosclerosis of aorta: Secondary | ICD-10-CM

## 2021-10-04 DIAGNOSIS — E46 Unspecified protein-calorie malnutrition: Secondary | ICD-10-CM

## 2021-10-04 DIAGNOSIS — I10 Essential (primary) hypertension: Secondary | ICD-10-CM | POA: Diagnosis not present

## 2021-10-04 DIAGNOSIS — Z7189 Other specified counseling: Secondary | ICD-10-CM | POA: Diagnosis not present

## 2021-10-04 DIAGNOSIS — E440 Moderate protein-calorie malnutrition: Secondary | ICD-10-CM

## 2021-10-04 DIAGNOSIS — Z0001 Encounter for general adult medical examination with abnormal findings: Secondary | ICD-10-CM | POA: Diagnosis not present

## 2021-10-04 DIAGNOSIS — Z9189 Other specified personal risk factors, not elsewhere classified: Secondary | ICD-10-CM

## 2021-10-04 DIAGNOSIS — I714 Abdominal aortic aneurysm, without rupture, unspecified: Secondary | ICD-10-CM

## 2021-10-04 DIAGNOSIS — R0609 Other forms of dyspnea: Secondary | ICD-10-CM

## 2021-10-04 DIAGNOSIS — J449 Chronic obstructive pulmonary disease, unspecified: Secondary | ICD-10-CM | POA: Diagnosis not present

## 2021-10-04 DIAGNOSIS — G8929 Other chronic pain: Secondary | ICD-10-CM | POA: Diagnosis not present

## 2021-10-04 DIAGNOSIS — Z96641 Presence of right artificial hip joint: Secondary | ICD-10-CM

## 2021-10-04 LAB — LIPID PANEL
Cholesterol: 137 mg/dL (ref 0–200)
HDL: 41.7 mg/dL (ref >39.00)
LDL Cholesterol: 82 mg/dL (ref 0–99)
NonHDL: 95.67
Total CHOL/HDL Ratio: 3
Triglycerides: 66 mg/dL (ref 0.0–149.0)
VLDL: 13.2 mg/dL (ref 0.0–40.0)

## 2021-10-04 LAB — IBC PANEL
Iron: 105 ug/dL (ref 42–165)
Saturation Ratios: 32.1 % (ref 20.0–50.0)
TIBC: 327.6 ug/dL (ref 250.0–450.0)
Transferrin: 234 mg/dL (ref 212.0–360.0)

## 2021-10-04 LAB — COMPREHENSIVE METABOLIC PANEL
ALT: 19 U/L (ref 0–53)
AST: 31 U/L (ref 0–37)
Albumin: 2.7 g/dL — ABNORMAL LOW (ref 3.5–5.2)
Alkaline Phosphatase: 178 U/L — ABNORMAL HIGH (ref 39–117)
BUN: 12 mg/dL (ref 6–23)
CO2: 31 mEq/L (ref 19–32)
Calcium: 8.8 mg/dL (ref 8.4–10.5)
Chloride: 104 mEq/L (ref 96–112)
Creatinine, Ser: 1.06 mg/dL (ref 0.40–1.50)
GFR: 73.09 mL/min (ref >60.00)
Glucose, Bld: 101 mg/dL — ABNORMAL HIGH (ref 70–99)
Potassium: 4.4 mEq/L (ref 3.5–5.1)
Sodium: 137 mEq/L (ref 135–145)
Total Bilirubin: 2 mg/dL — ABNORMAL HIGH (ref 0.2–1.2)
Total Protein: 6.9 g/dL (ref 6.0–8.3)

## 2021-10-04 LAB — CBC WITH DIFFERENTIAL/PLATELET
Basophils Absolute: 0 10*3/uL (ref 0.0–0.1)
Basophils Relative: 1 % (ref 0.0–3.0)
Eosinophils Absolute: 0.2 10*3/uL (ref 0.0–0.7)
Eosinophils Relative: 3.9 % (ref 0.0–5.0)
HCT: 33.4 % — ABNORMAL LOW (ref 39.0–52.0)
Hemoglobin: 11.2 g/dL — ABNORMAL LOW (ref 13.0–17.0)
Lymphocytes Relative: 18.1 % (ref 12.0–46.0)
Lymphs Abs: 0.9 10*3/uL (ref 0.7–4.0)
MCHC: 33.6 g/dL (ref 30.0–36.0)
MCV: 104.1 fl — ABNORMAL HIGH (ref 78.0–100.0)
Monocytes Absolute: 0.4 10*3/uL (ref 0.1–1.0)
Monocytes Relative: 7.3 % (ref 3.0–12.0)
Neutro Abs: 3.5 10*3/uL (ref 1.4–7.7)
Neutrophils Relative %: 69.7 % (ref 43.0–77.0)
Platelets: 46 10*3/uL — CL (ref 150.0–400.0)
RBC: 3.21 Mil/uL — ABNORMAL LOW (ref 4.22–5.81)
RDW: 16.4 % — ABNORMAL HIGH (ref 11.5–15.5)
WBC: 5 10*3/uL (ref 4.0–10.5)

## 2021-10-04 LAB — TSH: TSH: 1.44 u[IU]/mL (ref 0.35–5.50)

## 2021-10-04 LAB — VITAMIN D 25 HYDROXY (VIT D DEFICIENCY, FRACTURES): VITD: 23.97 ng/mL — ABNORMAL LOW (ref 30.00–100.00)

## 2021-10-04 LAB — VITAMIN B12: Vitamin B-12: 924 pg/mL — ABNORMAL HIGH (ref 211–911)

## 2021-10-04 LAB — FERRITIN: Ferritin: 35.8 ng/mL (ref 22.0–322.0)

## 2021-10-04 LAB — HEMOGLOBIN A1C: Hgb A1c MFr Bld: 5.2 % (ref 4.6–6.5)

## 2021-10-04 MED ORDER — PREDNISONE 20 MG PO TABS: ORAL_TABLET | ORAL | 0 refills | Status: DC

## 2021-10-04 MED ORDER — ARIPIPRAZOLE 5 MG PO TABS: 5.0000 mg | ORAL_TABLET | Freq: Every day | ORAL | 3 refills | Status: DC

## 2021-10-04 MED ORDER — SPIRIVA RESPIMAT 2.5 MCG/ACT IN AERS: 2.0000 | INHALATION_SPRAY | Freq: Every day | RESPIRATORY_TRACT | 11 refills | Status: DC

## 2021-10-04 MED ORDER — METOPROLOL SUCCINATE ER 25 MG PO TB24: 25.0000 mg | ORAL_TABLET | Freq: Every day | ORAL | 3 refills | Status: DC

## 2021-10-04 MED ORDER — DULOXETINE HCL 40 MG PO CPEP: 40.0000 mg | ORAL_CAPSULE | Freq: Every day | ORAL | 3 refills | Status: DC

## 2021-10-04 NOTE — Progress Notes (Signed)
? ? Patient ID: [name], male    DOB: [birth year], 66 y.o.   MRN: [id number] ? ?This visit was conducted in person. ? ?BP 122/84   Pulse 96   Temp 97.6 ?F (36.4 ?C) (Temporal)   Ht 5' 7.75" (1.721 m)   Wt (!) 300 lb 6 oz (136.2 kg)   SpO2 96%   BMI 46.01 kg/m?   ? ?CC: AMW/CPE ?Subjective:  ? ?HPI: ?[name] is a 66 y.o. male presenting on 10/04/2021 for Medicare Wellness ? ? ?Did not see health advisor this year. ? ?Hearing Screening  ? 250Hz  500Hz  1000Hz  2000Hz  4000Hz   ?Right ear 20 40 25 20 25   ?Left ear 25 20 25 20 20   ? ?Vision Screening  ? Right eye Left eye Both eyes  ?Without correction 20/70 20/50 20/50   ?With correction     ?Failed vision screen - he is overdue for eye exam.   ?[place] Office Visit from 10/04/2021 in [place] [facility] [place]  ?PHQ-2 Total Score 6  ? ?  ?  ? ?  10/04/2021  ? 11:45 AM 08/03/2020  ? 11:12 AM 11/21/2018  ? 12:07 PM 03/14/2018  ?  2:31 PM 11/15/2017  ?  9:49 AM  ?Fall Risk   ?Falls in the past year? 1 1 1  Yes Yes  ?Comment   8-10 falls due to loss of balance    ?Number falls in past yr: 1 1 1 1 2  or more  ?Injury with Fall? 1 1 1  No Yes  ?Comment  Bruiesed abd.     ?Risk for fall due to : History of fall(s);Impaired balance/gait;Impaired vision  History of fall(s);Impaired balance/gait;Impaired mobility    ? ?Chronic pain due to cervical and lumbar DDD s/p surgeries.  ?Fentanyl 61mg patch ineffective for pain control, so we increased back to 743m dose Q72hrs.  ? ?Has not scheduled psychiatry appt. "Nobody called [place]" despite multiple attempts by pt's wife to schedule.  ? ?Known NASH cirrhosis. Has no showed recent GI appts as pt refused to go. [place]ed [place]. Last seen 07/2021. He is on lasix 4093mnd aldactone 100m57mily. Has not been able to afford xifaxan. Due for EGD screen 05/2022.  ? ?Weight gain since last seen.  ?Notes progressive dyspnea and wheezing, chest feels tight.  ?No increased cough or increased  sputum production. No fevers/chills.  ?No increased swelling in legs.  ? ?Preventative: ?COLONOSCOPY Date: 05/06/2007 normal, small int hemorrhoids rpt 5 yrs due to fmhx. Virtual colonoscopy 07/2014 (ManCollene Marestold normal. No further colon screening recommended at this time due to comorbidities ?Prostate cancer screening - PSA previously reassuring. Defer due to comorbidities.  ?Lung cancer screening - eligible, defer at this time due to comorbidities and pending hip replacement surgery.  ?Flu shot yearly ?COVICabo [id number], 09/2019, booster 07/2020, bivalent 02/2021 ?Td 2007 ?Pneumovax 2007, prevnar-13 08/2020 ?Shingrix - discussed  ?Advanced directives: Wants wife to be HCPOA but not formally set up. Packet previously provided.  ?Seat belt use discussed  ?Sunscreen use discussed. No changing moles on skin.  ?Smoking - 1+ ppd smoking, >30PY hx.  ?Alcohol - none ?Dentist - due  ?Eye exam - due  ?Bowel - mild constipation managed with lactulose - doesn't like taste  ?Bladder - ongoing urinary urge incontinence - uses diapers  ?  ?Caffeine: 2 cups coffee, 2 cups soda ?Lives with wife, 1 dog, no children  ?On disability from  [place]arc[place] x 5 years ?Occupation: Neurosurgeon implants, [employer], now disability 2006 ?   ? ?Relevant past medical, surgical, family and social history reviewed and updated as indicated. Interim medical history since our last visit reviewed. ?Allergies and medications reviewed and updated. ?Outpatient Medications Prior to Visit  ?Medication Sig Dispense Refill  ? acetaminophen (TYLENOL) 500 MG tablet Take 1 tablet (500 mg total) by mouth in the morning, at noon, and at bedtime.    ? albuterol (PROVENTIL) (2.5 MG/3ML) 0.083% nebulizer solution USE 1 VIAL PER NEBULIZER EVERY 6 HRS AS NEEDED FOR WHEEZING 75 mL 6  ? aspirin 81 MG tablet Take 1 tablet (81 mg total) by mouth daily.    ? B Complex-C (SUPER B COMPLEX PO) Take 1 tablet by mouth daily.    ? Ca  Phosphate-Cholecalciferol [phone] MG-UNIT TABS Take 1 tablet by mouth daily.     ? cholecalciferol 2000 units TABS Take 2,000 Units by mouth daily.    ? fentaNYL (DURAGESIC) 75 MCG/HR Place 1 patch onto the skin every 3 (three) days. 10 patch 0  ? ferrous sulfate 325 (65 FE) MG tablet Take 1 tablet (325 mg total) by mouth every other day.    ? fluticasone (FLONASE) 50 MCG/ACT nasal spray Place 2 sprays into both nostrils daily as needed for allergies.     ? folic acid (FOLVITE) 1 MG tablet TAKE 1 TABLET BY MOUTH EVERY DAY 90 tablet 0  ? furosemide (LASIX) 40 MG tablet Take 1 tablet (40 mg total) by mouth daily. 90 tablet 1  ? gabapentin (NEURONTIN) 300 MG capsule Take 1 capsule (300 mg total) by mouth in the morning and at bedtime.    ? Lactulose 20 GM/30ML SOLN Take 30 mLs (20 g total) by mouth 3 (three) times daily. 2700 mL 5  ? Misc Natural Products (TART CHERRY ADVANCED) CAPS Take 1 capsule by mouth 2 (two) times daily.    ? nitroGLYCERIN (NITROSTAT) 0.4 MG SL tablet Place 1 tablet (0.4 mg total) under the tongue every 5 (five) minutes as needed for chest pain. 25 tablet 12  ? nystatin cream (MYCOSTATIN) APPLY TO AFFECTED AREA TWICE A DAY 30 g 1  ? omeprazole (PRILOSEC) 40 MG capsule TAKE 1 CAPSULE BY MOUTH EVERY DAY 90 capsule 1  ? ondansetron (ZOFRAN) 4 MG tablet TAKE 1 TABLET BY MOUTH EVERY 8 HOURS AS NEEDED FOR NAUSEA AND VOMITING 30 tablet 1  ? Red Yeast Rice 600 MG CAPS Take 2 capsules by mouth daily.    ? Respiratory Therapy Supplies (NEBULIZER) DEVI Use as instructed for albuterol nebulization, with necessary tubing 1 each 0  ? rifaximin (XIFAXAN) 550 MG TABS tablet Take 1 tablet (550 mg total) by mouth 2 (two) times daily. 60 tablet 11  ? spironolactone (ALDACTONE) 100 MG tablet Take 1 tablet (100 mg total) by mouth daily. 90 tablet 1  ? vitamin B-12 (CYANOCOBALAMIN) 500 MCG tablet Take 1 tablet (500 mcg total) by mouth every Monday, Wednesday, and Friday.    ? ARIPiprazole (ABILIFY) 2 MG tablet TAKE 1  TABLET BY MOUTH EVERY DAY 90 tablet 1  ? DULoxetine (CYMBALTA) 60 MG capsule TAKE 1 CAPSULE BY MOUTH EVERY DAY 90 capsule 0  ? metoprolol succinate (TOPROL-XL) 25 MG 24 hr tablet TAKE 1 TABLET (25 MG TOTAL) BY MOUTH DAILY. 90 tablet 0  ? sulfamethoxazole-trimethoprim (BACTRIM DS) 800-160 MG tablet Take 1 tablet by mouth 2 (two) times daily. 14 tablet 0  ? losartan (COZAAR) 25 MG  tablet Take 1 tablet (25 mg total) by mouth daily. 90 tablet 3  ? ?No facility-administered medications prior to visit.  ?  ? ?Per HPI unless specifically indicated in ROS section below ?Review of Systems  ?Constitutional:  Positive for fatigue. Negative for activity change, appetite change, chills, fever and unexpected weight change.  ?HENT:  Negative for hearing loss.   ?Eyes:  Positive for visual disturbance.  ?Respiratory:  Positive for chest tightness, shortness of breath and wheezing. Negative for cough.   ?Cardiovascular:  Positive for palpitations. Negative for chest pain and leg swelling.  ?Gastrointestinal:  Positive for abdominal pain, blood in stool, nausea and vomiting. Negative for abdominal distention, constipation and diarrhea.  ?Genitourinary:  Negative for difficulty urinating and hematuria.  ?Musculoskeletal:  Negative for arthralgias, myalgias and neck pain.  ?Skin:  Negative for rash.  ?Neurological:  Positive for headaches. Negative for dizziness, seizures and syncope.  ?Hematological:  Negative for adenopathy. Bruises/bleeds easily.  ?Psychiatric/Behavioral:  Positive for dysphoric mood.   ? ?Objective:  ?BP 122/84   Pulse 96   Temp 97.6 ?F (36.4 ?C) (Temporal)   Ht 5' 7.75" (1.721 m)   Wt (!) 300 lb 6 oz (136.2 kg)   SpO2 96%   BMI 46.01 kg/m?   ?Wt Readings from Last 3 Encounters:  ?10/04/21 (!) 300 lb 6 oz (136.2 kg)  ?07/19/21 286 lb (129.7 kg)  ?07/18/21 281 lb (127.5 kg)  ?  ?  ?Physical Exam ?Vitals and nursing note reviewed.  ?Constitutional:   ?   General: He is not in acute distress. ?   Appearance: He  is well-developed. He is obese. He is not ill-appearing.  ?   Comments: Ambulates with cane  ?HENT:  ?   Head: Normocephalic and atraumatic.  ?   Right Ear: Hearing, tympanic membrane, ear canal and external ear normal.  ?   Left Ear

## 2021-10-04 NOTE — Telephone Encounter (Signed)
Error

## 2021-10-04 NOTE — Assessment & Plan Note (Signed)

## 2021-10-04 NOTE — Patient Instructions (Addendum)
Labs today  ?Hearing test today  ?Drop cymbalta dose to 64m daily. Also increase abilify to 565mdaily.  ?Start daily breathing medicine for COPD - spiriva 2 puffs daily.  ?Increase lasix to [id number] daily for the next 3 days, update us [place] effect on breathing.  ?If interested, check with pharmacy about new 2 shot shingles series (shingrix).  ?I don't recommend driving given difficulty with vision and unsteadiness.  ?Prednisone for breathing as well.  ?Return in 3 months for follow up visit.  ? ?Health Maintenance After Age 45 66fter age 22,80ou are at a higher risk for certain long-term diseases and infections as well as injuries from falls. Falls are a major cause of broken bones and head injuries in people who are older than age 22.5etting regular preventive care can help to keep you healthy and well. Preventive care includes getting regular testing and making lifestyle changes as recommended by your health care provider. Talk with your health care provider about: ?Which screenings and tests you should have. A screening is a test that checks for a disease when you have no symptoms. ?A diet and exercise plan that is right for you. ?What should I know about screenings and tests to prevent falls? ?Screening and testing are the best ways to find a health problem early. Early diagnosis and treatment give you the best chance of managing medical conditions that are common after age 22.7ertain conditions and lifestyle choices may make you more likely to have a fall. Your health care provider may recommend: ?Regular vision checks. Poor vision and conditions such as cataracts can make you more likely to have a fall. If you wear glasses, make sure to get your prescription updated if your vision changes. ?Medicine review. Work with your health care provider to regularly review all of the medicines you are taking, including over-the-counter medicines. Ask your health care provider about any side effects that may make you  more likely to have a fall. Tell your health care provider if any medicines that you take make you feel dizzy or sleepy. ?Strength and balance checks. Your health care provider may recommend certain tests to check your strength and balance while standing, walking, or changing positions. ?Foot health exam. Foot pain and numbness, as well as not wearing proper footwear, can make you more likely to have a fall. ?Screenings, including: ?Osteoporosis screening. Osteoporosis is a condition that causes the bones to get weaker and break more easily. ?Blood pressure screening. Blood pressure changes and medicines to control blood pressure can make you feel dizzy. ?Depression screening. You may be more likely to have a fall if you have a fear of falling, feel depressed, or feel unable to do activities that you used to do. ?Alcohol use screening. Using too much alcohol can affect your balance and may make you more likely to have a fall. ?Follow these instructions at home: ?Lifestyle ?Do not drink alcohol if: ?Your health care provider tells you not to drink. ?If you drink alcohol: ?Limit how much you have to: ?0-1 drink a day for women. ?0-2 drinks a day for men. ?Know how much alcohol is in your drink. In the [place] one drink equals one 12 oz bottle of beer (355 mL), one 5 oz glass of wine (148 mL), or one 1? oz glass of hard liquor (44 mL). ?Do not use any products that contain nicotine or tobacco. These products include cigarettes, chewing tobacco, and vaping devices, such as e-cigarettes. If you need  help quitting, ask your health care provider. ?Activity ? ?Follow a regular exercise program to stay fit. This will help you maintain your balance. Ask your health care provider what types of exercise are appropriate for you. ?If you need a cane or walker, use it as recommended by your health care provider. ?Wear supportive shoes that have nonskid soles. ?Safety ? ?Remove any tripping hazards, such as rugs, cords, and  clutter. ?Install safety equipment such as grab bars in bathrooms and safety rails on stairs. ?Keep rooms and walkways well-lit. ?General instructions ?Talk with your health care provider about your risks for falling. Tell your health care provider if: ?You fall. Be sure to tell your health care provider about all falls, even ones that seem minor. ?You feel dizzy, tiredness (fatigue), or off-balance. ?Take over-the-counter and prescription medicines only as told by your health care provider. These include supplements. ?Eat a healthy diet and maintain a healthy weight. A healthy diet includes low-fat dairy products, low-fat (lean) meats, and fiber from whole grains, beans, and lots of fruits and vegetables. ?Stay current with your vaccines. ?Schedule regular health, dental, and eye exams. ?Summary ?Having a healthy lifestyle and getting preventive care can help to protect your health and wellness after age 39. ?Screening and testing are the best way to find a health problem early and help you avoid having a fall. Early diagnosis and treatment give you the best chance for managing medical conditions that are more common for people who are older than age 82. ?Falls are a major cause of broken bones and head injuries in people who are older than age 93. Take precautions to prevent a fall at home. ?Work with your health care provider to learn what changes you can make to improve your health and wellness and to prevent falls. ?This information is not intended to replace advice given to you by your health care provider. Make sure you discuss any questions you have with your health care provider. ?Document Revised: 10/11/2020 Document Reviewed: 10/11/2020 ?Elsevier Patient Education ? [place]. ? ?

## 2021-10-04 NOTE — Assessment & Plan Note (Addendum)
Preventative protocols reviewed and updated unless pt declined. ?Discussed healthy diet and lifestyle.  ?Preventative health care largely deferred due to comorbidities. ?

## 2021-10-04 NOTE — Telephone Encounter (Signed)
This is chronic.  ?

## 2021-10-04 NOTE — Telephone Encounter (Signed)
[facility] called a critical result, 46 platelet. Results to  Dr Danise Mina ?

## 2021-10-04 NOTE — Assessment & Plan Note (Signed)
[place] CSRS reviewed  ?

## 2021-10-04 NOTE — Assessment & Plan Note (Signed)
Advanced directives: Wants wife to be HCPOA but not formally set up. Packet previously provided.  ?

## 2021-10-05 LAB — PROTIME-INR
INR: 1.3 ratio — ABNORMAL HIGH (ref 0.8–1.0)
Prothrombin Time: 13.6 s — ABNORMAL HIGH (ref 9.6–13.1)

## 2021-10-06 ENCOUNTER — Telehealth: Payer: Self-pay

## 2021-10-06 DIAGNOSIS — Z9189 Other specified personal risk factors, not elsewhere classified: Secondary | ICD-10-CM | POA: Insufficient documentation

## 2021-10-06 DIAGNOSIS — H547 Unspecified visual loss: Secondary | ICD-10-CM | POA: Insufficient documentation

## 2021-10-06 MED ORDER — DULOXETINE HCL 30 MG PO CPEP: 30.0000 mg | ORAL_CAPSULE | Freq: Every day | ORAL | 3 refills | Status: DC

## 2021-10-06 NOTE — Assessment & Plan Note (Signed)
Update levels on 2000 unit daily replacement ?

## 2021-10-06 NOTE — Assessment & Plan Note (Addendum)
Status post endovascular repair 2017. ?Regularly sees [facility] with next appointment scheduled September 2023. ?

## 2021-10-06 NOTE — Assessment & Plan Note (Addendum)
Chronic, stable period on Lasix, losartan, Toprol-XL and spironolactone. ?

## 2021-10-06 NOTE — Assessment & Plan Note (Signed)
Continue aspirin and red yeast rice and statin intolerance.  He regularly sees cardiology. ?

## 2021-10-06 NOTE — Assessment & Plan Note (Signed)
Update A1c ?

## 2021-10-06 NOTE — Assessment & Plan Note (Signed)
I recommend against driving due to vision issues and recent cognitive fluctuations in setting of liver disease/cirrhosis. ?

## 2021-10-06 NOTE — Assessment & Plan Note (Signed)
Weight gain again noted.  ?

## 2021-10-06 NOTE — Assessment & Plan Note (Addendum)
Statin intolerance.  Continue aspirin and red yeast rice. ?

## 2021-10-06 NOTE — Assessment & Plan Note (Signed)
Contributes to disability. ?

## 2021-10-06 NOTE — Assessment & Plan Note (Signed)
Chronic, ongoing issue. ?

## 2021-10-06 NOTE — Assessment & Plan Note (Addendum)
Presumed [name] cirrhosis followed by GI.  However he has no showed last few visits. ?He does report compliance with lactulose, takes this once to twice daily. ?We will start dropping Cymbalta dose due to liver disease.  See below. ?MELD-Na score: 13 at 10/04/2021 12:46 PM ?MELD score: 13 at 10/04/2021 12:46 PM ?Calculated from: ?Serum Creatinine: 1.06 mg/dL at 10/04/2021 12:46 PM ?Serum Sodium: 137 mEq/L at 10/04/2021 12:46 PM ?Total Bilirubin: 2.0 mg/dL at 10/04/2021 12:46 PM ?INR(ratio): 1.3 ratio at 10/04/2021 12:46 PM ?Age: 66 years  ?

## 2021-10-06 NOTE — Assessment & Plan Note (Addendum)
Lower 26mg fentanyl TD patch was ineffective for pain control so now back on 747m Q72 hours.  ?They have been unable to get in with pain clinic.  ?

## 2021-10-06 NOTE — Assessment & Plan Note (Signed)
Cirrhosis related. ?

## 2021-10-06 NOTE — Assessment & Plan Note (Signed)
Chronic, severe.  Abilify started late last year.  Given liver disease, we will taper down on Cymbalta dose and increase Abilify to 5 mg daily. ?We have been unsuccessful getting him in with psychiatry.  I encouraged wife to keep calling to schedule appointment. ?

## 2021-10-06 NOTE — Assessment & Plan Note (Addendum)
Update FLP on RYR 1289m daily. Statin intolerance, zetia caused leg pain.  ?The ASCVD Risk score (Arnett DK, et al., 2019) failed to calculate for the following reasons: ?  The patient has a prior MI or stroke diagnosis  ?

## 2021-10-06 NOTE — Assessment & Plan Note (Signed)
Update protein levels. ?

## 2021-10-06 NOTE — Assessment & Plan Note (Addendum)
Stable period off urate lowering medication. ?

## 2021-10-06 NOTE — Telephone Encounter (Signed)
ERx 58m cymbalta.  ?

## 2021-10-06 NOTE — Assessment & Plan Note (Addendum)
This may be contributing to worsening dyspnea-we will treat with prednisone taper and recommend he start Spiriva Respimat 2 puffs daily.  Update with effect. ?

## 2021-10-06 NOTE — Assessment & Plan Note (Addendum)
Progressive worsening- anticipate COPD exacerbation related, rule out CHF exacerbation. ?We will treat with prednisone taper and start controller medication Spiriva Respimat daily. ?I also asked him to increase Lasix to twice daily dosing for 3 days. ?Update with effect. ?

## 2021-10-06 NOTE — Assessment & Plan Note (Signed)
Encouraged full cessation.  ?

## 2021-10-06 NOTE — Assessment & Plan Note (Signed)
Update levels on Monday Wednesday Friday 500 mcg replacement. ?

## 2021-10-06 NOTE — Assessment & Plan Note (Signed)
Recommend he schedule eye doctor appointment. ?

## 2021-10-06 NOTE — Addendum Note (Signed)
Addended by: Ria Bush on: 10/06/2021 12:57 PM ? ? Modules accepted: Orders ? ?

## 2021-10-18 ENCOUNTER — Other Ambulatory Visit: Payer: Self-pay | Admitting: *Deleted

## 2021-10-18 NOTE — Telephone Encounter (Signed)
Name of Medication: Fentanyl patch ?Name of Pharmacy: [facility] ?Last Fill or Written Date and Quantity: 09/23/21, #10 patch ?Last Office Visit and Type: AWV on 10/04/21 ?Next Office Visit and Type: f/u on 01/04/22 ?Last Controlled Substance Agreement Date: 03/22/21 ?Last UDS: 03/22/21 ?

## 2021-10-19 MED ORDER — FENTANYL 75 MCG/HR TD PT72: 1.0000 | MEDICATED_PATCH | TRANSDERMAL | 0 refills | Status: DC

## 2021-10-19 NOTE — Telephone Encounter (Signed)
ERx 

## 2021-11-06 ENCOUNTER — Other Ambulatory Visit: Payer: Self-pay | Admitting: Family Medicine

## 2021-11-08 NOTE — Progress Notes (Deleted)
HPI:FU coronary artery disease, abdominal aortic aneurysm, hypertension, DM, hyperlipidemia, COPD, cirrhosis, tobacco abuse. He has had prior inferior infarct with PCI of his right coronary artery and PDA. His most recent cardiac catheterization was performed on March 30, 2005. The patient had an ejection fraction of 40% with inferior akinesis at that time. However, his stents in the right coronary artery were widely patent. Carotid Dopplers April 2016 showed no significant stenosis.  CTA July 2019 showed prior endovascular repair of suprarenal abdominal aortic aneurysm without complication.  Renal artery stents were widely patent.  There was note of hepatic cirrhosis and portal venous hypertension with varices around the gastroesophageal junction as well as splenomegaly. Echocardiogram May 2020 showed ejection fraction 35 to 40%, moderate left ventricular enlargement, mild left ventricular hypertrophy, mild right ventricular enlargement. Nuclear study December 2020 showed ejection fraction 43%, prior inferior/inferoseptal infarct with insignificant peri-infarct ischemia. Abdominal ultrasound 9/22 showed patent endovascular aneurysm repair with no leak.  Since last seen   Current Outpatient Medications  Medication Sig Dispense Refill   acetaminophen (TYLENOL) 500 MG tablet Take 1 tablet (500 mg total) by mouth in the morning, at noon, and at bedtime.     albuterol (PROVENTIL) (2.5 MG/3ML) 0.083% nebulizer solution USE 1 VIAL PER NEBULIZER EVERY 6 HRS AS NEEDED FOR WHEEZING 75 mL 6   ARIPiprazole (ABILIFY) 5 MG tablet Take 1 tablet (5 mg total) by mouth daily. 90 tablet 3   aspirin 81 MG tablet Take 1 tablet (81 mg total) by mouth daily.     B Complex-C (SUPER B COMPLEX PO) Take 1 tablet by mouth daily.     Ca Phosphate-Cholecalciferol [phone] MG-UNIT TABS Take 1 tablet by mouth daily.      cholecalciferol 2000 units TABS Take 2,000 Units by mouth daily.     DULoxetine (CYMBALTA) 30 MG capsule  Take 1 capsule (30 mg total) by mouth daily. 90 capsule 3   fentaNYL (DURAGESIC) 75 MCG/HR Place 1 patch onto the skin every 3 (three) days. 10 patch 0   ferrous sulfate 325 (65 FE) MG tablet Take 1 tablet (325 mg total) by mouth every other day.     fluticasone (FLONASE) 50 MCG/ACT nasal spray Place 2 sprays into both nostrils daily as needed for allergies.      folic acid (FOLVITE) 1 MG tablet TAKE 1 TABLET BY MOUTH EVERY DAY 90 tablet 3   furosemide (LASIX) 40 MG tablet Take 1 tablet (40 mg total) by mouth daily. 90 tablet 1   gabapentin (NEURONTIN) 300 MG capsule Take 1 capsule (300 mg total) by mouth in the morning and at bedtime.     Lactulose 20 GM/30ML SOLN Take 30 mLs (20 g total) by mouth 3 (three) times daily. 2700 mL 5   losartan (COZAAR) 25 MG tablet Take 1 tablet (25 mg total) by mouth daily. 90 tablet 3   metoprolol succinate (TOPROL-XL) 25 MG 24 hr tablet Take 1 tablet (25 mg total) by mouth daily. 90 tablet 3   Misc Natural Products (TART CHERRY ADVANCED) CAPS Take 1 capsule by mouth 2 (two) times daily.     nitroGLYCERIN (NITROSTAT) 0.4 MG SL tablet Place 1 tablet (0.4 mg total) under the tongue every 5 (five) minutes as needed for chest pain. 25 tablet 12   nystatin cream (MYCOSTATIN) APPLY TO AFFECTED AREA TWICE A DAY 30 g 1   omeprazole (PRILOSEC) 40 MG capsule TAKE 1 CAPSULE BY MOUTH EVERY DAY 90 capsule 1  ondansetron (ZOFRAN) 4 MG tablet TAKE 1 TABLET BY MOUTH EVERY 8 HOURS AS NEEDED FOR NAUSEA AND VOMITING 30 tablet 1   predniSONE (DELTASONE) 20 MG tablet Take two tablets daily for 3 days followed by one tablet daily for 4 days 10 tablet 0   Red Yeast Rice 600 MG CAPS Take 2 capsules by mouth daily.     Respiratory Therapy Supplies (NEBULIZER) DEVI Use as instructed for albuterol nebulization, with necessary tubing 1 each 0   rifaximin (XIFAXAN) 550 MG TABS tablet Take 1 tablet (550 mg total) by mouth 2 (two) times daily. 60 tablet 11   spironolactone (ALDACTONE) 100 MG  tablet Take 1 tablet (100 mg total) by mouth daily. 90 tablet 1   Tiotropium Bromide Monohydrate (SPIRIVA RESPIMAT) 2.5 MCG/ACT AERS Inhale 2 puffs into the lungs daily. 4 g 11   vitamin B-12 (CYANOCOBALAMIN) 500 MCG tablet Take 1 tablet (500 mcg total) by mouth every Monday, Wednesday, and Friday.     No current facility-administered medications for this visit.     Past Medical History:  Diagnosis Date   AAA (abdominal aortic aneurysm) ([place]) 09/2012--  monitored by dr Trula Slade   stable 5.6cm CTA abdomen 2016   Abnormal drug screen 07/09/2016   1/2/018 - positive oxycodone, fentanyl, inapprop positive MJ - mod risk   Allergic rhinitis    Ascites 03/2019   B12 deficiency    CAD (coronary artery disease) cardiologist-  dr Stanford Breed   x3 with stents last 2005, EF 40%, predominantly RCA by CT 2016   Cataracts, bilateral    Cervical spondylosis 05/2010   s/p surgery   Charcot Lelan Pons Tooth muscular atrophy dx  1975   neurologist--  dr love--  type 2 per pt   Chronic pain syndrome    established with [facility] ([place] --> disagreement and transfered care to Dr Sanjuan Dame at [facility] clinic [facility], requests PCP write Rx but f/u with pain clinic Q6-12 months   COPD (chronic obstructive pulmonary disease) ([place]) 10/2011   minimal by PFTs   DDD (degenerative disc disease)    Disturbances of sensation of smell and taste    improving   Dyspnea on exertion    GERD (gastroesophageal reflux disease)    Gout    Headache    Hepatitis    hepatitis B   Hidradenitis    right groin   Hidradenitis suppurativa dx 2011   goin and leg crease   followd by Lyndle Herrlich - daily bactrim, s/p intralesional steroid injection 10/2010   Hip osteoarthritis    s/p intraarticular steroid shot (12/2012) ([place])   History of hepatitis B 1983   History of MI (myocardial infarction)    2000  &  2005   History of pneumonia    History of viral meningitis 2000   HLD (hyperlipidemia)    HTN  (hypertension)    Ischemic cardiomyopathy    s/p inferior MI  --  current ef per myoview 39%   Liver cirrhosis secondary to NASH ([place]) 01/2014   by CT scan, rec virtual colonoscopy by Dr Collene Mares 06/2014   Lumbar herniated disc    Myocardial infarction ([place])    x2   Nocturia more than twice per night    Obesity    Spinal stenosis    released from [place].  established with [facility] (07/2013)   T2DM (type 2 diabetes mellitus) ([place])    ABIs WNL 2016   Vitamin D deficiency     Past  Surgical History:  Procedure Laterality Date   ABDOMINAL AORTIC ENDOVASCULAR FENESTRATED STENT GRAFT N/A 11/30/2015   Procedure: ABDOMINAL AORTIC ENDOVASCULAR FENESTRATED STENT GRAFT;  Surgeon: Serafina Mitchell, MD;  Location: [facility];  Service: Vascular;  Laterality: N/A;   ANTERIOR CERVICAL DECOMP/DISCECTOMY FUSION  01-07-2010    C4 -- C7   CARDIAC CATHETERIZATION  03-30-2005  dr Albertine Patricia   ef 40% w/ inferior akinesis/  LM and CFX angiographically normal/  pLAD 30%/   Widely patent stents in RCA and PDA widely patent   CARDIOVASCULAR STRESS TEST  10-23-2012  dr Stanford Breed   No ischemia/  Moderate scar in the inferior wall, otherwise normal perfusion/  LV ef 39%,  LV wall motion: inferior/ inferolateral hypokinesis   COLONOSCOPY  05/06/2007   normal, small int hemorrhoids rpt 5 yrs due to fmhx - rec against rpt colonoscopy by Dr Collene Mares   CORONARY ANGIOPLASTY  2000  dr Stanford Breed   PCI to RCA and PDA   CORONARY ANGIOPLASTY WITH STENT PLACEMENT  03-19-2005  dr Gwyndolyn Saxon [place]   inferior STEMI--- DES x4 to RCA w/ balloon angioplasty and balloon angioplasty to jailed PDA ostium/  severe hypokinesis of midinferor wall, ef 50%/  dLM 20%,  mLAD 20%,  dCFX 60%   ESOPHAGOGASTRODUODENOSCOPY  01/2017   dilated benign esophageal stenosis, portal hypertensive gastropathy Henrene Pastor)   ESOPHAGOGASTRODUODENOSCOPY (EGD) WITH PROPOFOL N/A 02/20/2018   benign biopsy Vicente Males, Bailey Mech, MD)   ESOPHAGOGASTRODUODENOSCOPY (EGD) WITH PROPOFOL N/A 05/12/2019    Procedure: ESOPHAGOGASTRODUODENOSCOPY (EGD) WITH PROPOFOL;  Surgeon: [name], MD;  Location[facility] ENDOSCOPY;  Service: Gastroenterology;  Laterality: N/A;   HYDRADENITIS EXCISION Right 12/31/2014   Procedure: WIDE EXCISION HIDRADENITIS GROIN; Coralie Keens, MD   IR PARACENTESIS  03/25/2019   LUMBAR DISC SURGERY     L5-S1   LUMBAR LAMINECTOMY  05-18-2010   L2--5   laminectomy/foraminotomy for stenosis ([place])   MYELOGRAM     L5-S1 and L1-2 spondylosis   SACROILIAC JOINT INJECTION Bilateral 10/2013   [place]   TONSILLECTOMY AND ADENOIDECTOMY  1972    Social History   Socioeconomic History   Marital status: Married    Spouse name: [name]   Number of children: 0   Years of education: [employer] education level: Not on file  Occupational History   Occupation: Neurosurgeon implants, crown and bridge-now disability 2006    Employer: UNEMPLOYED  Tobacco Use   Smoking status: Every Day    Packs/day: 1.00    Years: 57.00    Pack years: 57.00    Types: Cigarettes, E-cigarettes    Start date: 06/06/1967    Last attempt to quit: 07/21/2019    Years since quitting: 2.3   Smokeless tobacco: Never   Tobacco comments:    stopped smoking a pipe in 2015  DOES SMOKE E CIG  Vaping Use   Vaping Use: Former  Substance and Sexual Activity   Alcohol use: No    Alcohol/week: 0.0 standard drinks   Drug use: Yes    Types: Fentanyl, Hydrocodone   Sexual activity: Not Currently  Other Topics Concern   Not on file  Social History Narrative   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 x 5 years   caffeine: 2 cups coffee, 2 cups soda   Occupation: Neurosurgeon implants, crowne and bridge, now disability 2006   Lives with wife, 1 dog, no children   Social Determinants of Health   Financial Resource Strain: High Risk   Difficulty of Paying Living Expenses: Hard  Food Insecurity: Not on file  Transportation Needs: Not on file  Physical Activity: Not on file  Stress: Not on  file  Social Connections: Not on file  Intimate Partner Violence: Not on file    Family History  Problem Relation Age of Onset   Cancer Mother        colon   Diabetes Mother    Kidney disease Mother    Aneurysm Mother        AAA   Rheum arthritis Mother    Charcot-Marie-Tooth disease Mother    Heart disease Mother        before age 15   Cancer Father        skin   Heart attack Father    Heart disease Father        before age 59   Cancer Brother        skin   Coronary artery disease Brother    Cancer Brother        small cell lung cancer   Aneurysm Brother        AAA   Rheum arthritis Sister    Rheum arthritis Brother    Prostate cancer Neg Hx    Bladder Cancer Neg Hx    Kidney cancer Neg Hx     ROS: no fevers or chills, productive cough, hemoptysis, dysphasia, odynophagia, melena, hematochezia, dysuria, hematuria, rash, seizure activity, orthopnea, PND, pedal edema, claudication. Remaining systems are negative.  Physical Exam: Well-developed well-nourished in no acute distress.  Skin is warm and dry.  HEENT is normal.  Neck is supple.  Chest is clear to auscultation with normal expansion.  Cardiovascular exam is regular rate and rhythm.  Abdominal exam nontender or distended. No masses palpated. Extremities show no edema. neuro grossly intact  ECG- personally reviewed  A/P  1 coronary artery disease-plan to continue medical therapy with aspirin.  He is intolerant to statins.  2 ischemic cardiomyopathy-continue beta-blocker at present dose.  Discontinue losartan and instead treat with Entresto 24/26 twice daily.  Check potassium and renal function in 1 week.  3 hypertension-blood pressure is controlled.  Medication adjustments as outlined above.  4 hyperlipidemia-patient is intolerant to statins.  We will continue Zetia.  Check lipids and liver.  5 tobacco abuse-patient again counseled on discontinuing.  6 history of abdominal aortic aneurysm  repair-followed by vascular surgery.  7 cirrhosis-followed by gastroenterology.  Kirk Ruths, MD

## 2021-11-11 ENCOUNTER — Ambulatory Visit: Payer: Medicare HMO | Admitting: Cardiology

## 2021-11-14 ENCOUNTER — Other Ambulatory Visit: Payer: Self-pay | Admitting: Family Medicine

## 2021-11-16 ENCOUNTER — Telehealth: Payer: Self-pay | Admitting: Family Medicine

## 2021-11-16 DIAGNOSIS — R296 Repeated falls: Secondary | ICD-10-CM

## 2021-11-16 DIAGNOSIS — R413 Other amnesia: Secondary | ICD-10-CM

## 2021-11-16 DIAGNOSIS — G600 Hereditary motor and sensory neuropathy: Secondary | ICD-10-CM

## 2021-11-16 DIAGNOSIS — J449 Chronic obstructive pulmonary disease, unspecified: Secondary | ICD-10-CM

## 2021-11-16 DIAGNOSIS — G8929 Other chronic pain: Secondary | ICD-10-CM

## 2021-11-16 DIAGNOSIS — R188 Other ascites: Secondary | ICD-10-CM

## 2021-11-16 DIAGNOSIS — F332 Major depressive disorder, recurrent severe without psychotic features: Secondary | ICD-10-CM

## 2021-11-16 DIAGNOSIS — Z9189 Other specified personal risk factors, not elsewhere classified: Secondary | ICD-10-CM

## 2021-11-16 DIAGNOSIS — Z96641 Presence of right artificial hip joint: Secondary | ICD-10-CM

## 2021-11-16 DIAGNOSIS — D696 Thrombocytopenia, unspecified: Secondary | ICD-10-CM

## 2021-11-16 DIAGNOSIS — R627 Adult failure to thrive: Secondary | ICD-10-CM

## 2021-11-16 DIAGNOSIS — E440 Moderate protein-calorie malnutrition: Secondary | ICD-10-CM

## 2021-11-16 DIAGNOSIS — R419 Unspecified symptoms and signs involving cognitive functions and awareness: Secondary | ICD-10-CM

## 2021-11-16 MED ORDER — FENTANYL 75 MCG/HR TD PT72: 1.0000 | MEDICATED_PATCH | TRANSDERMAL | 0 refills | Status: DC

## 2021-11-16 NOTE — Telephone Encounter (Signed)
Pt wife [name] called and said that pt is not taking his medications, he wont take a shower or bath, he refuses wifes help, he verbally abuses her everyday, She wants to see if they can get a nurse out to check him out to see if they can get evaluated, hes also refuses to go to doctor. Hes been spiteful to her. She said he has sorosis of the liver and she thinks its affecting his mind. He is blaming her for everything and lies about everything. Shes requesting a call back from someone at [phone].   She also said he needs a refill on fentaNYL ([place]) 75 MCG/HR, he is out, she says he needs today[facility] [place]

## 2021-11-16 NOTE — Telephone Encounter (Signed)
Fentanyl refilled.   I'm sorry to hear he continues struggling at home.  We previously had social worker come out to house for evaluation - what was result/recommendation from that evaluation?  New HHRN and SW referral placed for evaluation due to worsening symptoms.  He has previously declined placement.

## 2021-11-17 NOTE — Telephone Encounter (Addendum)
Pt's wife, [name], rtn call.  I notified her fentanyl refill sent to pharmacy and relayed Dr. Synthia Innocent message.  She verbalizes understanding.  States when SW came out, the visit was directed at her [[name]] because pt told SW that she [[name]] was a Control and instrumentation engineer and not trustworthy.  So they were wanting [name] to be drug tested and were asking questions about her care and/or mistreatment of pt due. [name] states she got upset because the visit turned to be about her and not the pt so she told SW to leave the home.   [name] is requesting a letter from Dr. Darnell Level stating pt cannot drive (due to pt continuously threatening to drive) and should have license terminated.

## 2021-11-17 NOTE — Telephone Encounter (Signed)
Lvm asking pt's wife, [name] (on dpr), to call back.  Need to relay Dr. Synthia Innocent message and get answer to his question.

## 2021-11-28 ENCOUNTER — Encounter: Payer: Self-pay | Admitting: Urology

## 2021-11-30 ENCOUNTER — Encounter: Payer: Medicare HMO | Admitting: Physician Assistant

## 2021-11-30 NOTE — Progress Notes (Signed)
This encounter was created in error - please disregard.

## 2021-12-02 DIAGNOSIS — M6281 Muscle weakness (generalized): Secondary | ICD-10-CM

## 2021-12-02 DIAGNOSIS — M519 Unspecified thoracic, thoracolumbar and lumbosacral intervertebral disc disorder: Secondary | ICD-10-CM | POA: Diagnosis not present

## 2021-12-02 DIAGNOSIS — I350 Nonrheumatic aortic (valve) stenosis: Secondary | ICD-10-CM | POA: Diagnosis not present

## 2021-12-02 DIAGNOSIS — H6121 Impacted cerumen, right ear: Secondary | ICD-10-CM

## 2021-12-02 DIAGNOSIS — M109 Gout, unspecified: Secondary | ICD-10-CM | POA: Diagnosis not present

## 2021-12-02 DIAGNOSIS — E46 Unspecified protein-calorie malnutrition: Secondary | ICD-10-CM

## 2021-12-02 DIAGNOSIS — R627 Adult failure to thrive: Secondary | ICD-10-CM

## 2021-12-02 DIAGNOSIS — M1990 Unspecified osteoarthritis, unspecified site: Secondary | ICD-10-CM | POA: Diagnosis not present

## 2021-12-02 DIAGNOSIS — J449 Chronic obstructive pulmonary disease, unspecified: Secondary | ICD-10-CM | POA: Diagnosis not present

## 2021-12-02 DIAGNOSIS — Z96641 Presence of right artificial hip joint: Secondary | ICD-10-CM

## 2021-12-02 DIAGNOSIS — Z7952 Long term (current) use of systemic steroids: Secondary | ICD-10-CM

## 2021-12-02 DIAGNOSIS — K760 Fatty (change of) liver, not elsewhere classified: Secondary | ICD-10-CM

## 2021-12-02 DIAGNOSIS — L732 Hidradenitis suppurativa: Secondary | ICD-10-CM

## 2021-12-02 DIAGNOSIS — R7303 Prediabetes: Secondary | ICD-10-CM | POA: Diagnosis not present

## 2021-12-02 DIAGNOSIS — G894 Chronic pain syndrome: Secondary | ICD-10-CM | POA: Diagnosis not present

## 2021-12-02 DIAGNOSIS — G600 Hereditary motor and sensory neuropathy: Secondary | ICD-10-CM

## 2021-12-02 DIAGNOSIS — E785 Hyperlipidemia, unspecified: Secondary | ICD-10-CM

## 2021-12-02 DIAGNOSIS — Z6841 Body Mass Index (BMI) 40.0 and over, adult: Secondary | ICD-10-CM

## 2021-12-02 DIAGNOSIS — Z9181 History of falling: Secondary | ICD-10-CM

## 2021-12-02 DIAGNOSIS — K7581 Nonalcoholic steatohepatitis (NASH): Secondary | ICD-10-CM

## 2021-12-02 DIAGNOSIS — I10 Essential (primary) hypertension: Secondary | ICD-10-CM | POA: Diagnosis not present

## 2021-12-02 DIAGNOSIS — F17210 Nicotine dependence, cigarettes, uncomplicated: Secondary | ICD-10-CM

## 2021-12-02 DIAGNOSIS — I714 Abdominal aortic aneurysm, without rupture, unspecified: Secondary | ICD-10-CM

## 2021-12-02 DIAGNOSIS — E538 Deficiency of other specified B group vitamins: Secondary | ICD-10-CM

## 2021-12-02 DIAGNOSIS — D696 Thrombocytopenia, unspecified: Secondary | ICD-10-CM

## 2021-12-02 DIAGNOSIS — I700 Atherosclerosis of aorta: Secondary | ICD-10-CM

## 2021-12-02 DIAGNOSIS — F332 Major depressive disorder, recurrent severe without psychotic features: Secondary | ICD-10-CM

## 2021-12-02 DIAGNOSIS — E559 Vitamin D deficiency, unspecified: Secondary | ICD-10-CM

## 2021-12-02 DIAGNOSIS — I2510 Atherosclerotic heart disease of native coronary artery without angina pectoris: Secondary | ICD-10-CM | POA: Diagnosis not present

## 2021-12-05 ENCOUNTER — Encounter: Payer: Self-pay | Admitting: Physician Assistant

## 2021-12-05 ENCOUNTER — Telehealth: Payer: Self-pay | Admitting: Family Medicine

## 2021-12-05 NOTE — Telephone Encounter (Signed)
Ebony Hail form [place] called and said that when [facility] saw patient they recommended Hospice care for patient and they wanted to know if Dr Darnell Level agreed and if they could evaluate patient. Call back is [phone]

## 2021-12-05 NOTE — Telephone Encounter (Signed)
Lvm asking Ebony Hail (of [place]) to call back.  Need to relay Dr. Synthia Innocent message.

## 2021-12-05 NOTE — Telephone Encounter (Signed)
Agree with hospice eval if pt/wife are in agreement.

## 2021-12-05 NOTE — Telephone Encounter (Addendum)
Home Health Verbal Orders Caller Name: Mable Fill Agency Name: [facility] number: [phone]  Requesting: Social Work  Reason: Recommended options  Frequency: For next week

## 2021-12-05 NOTE — Telephone Encounter (Signed)
[name] is returning a call to [place].

## 2021-12-05 NOTE — Telephone Encounter (Signed)
[name] rtn call.  I relayed Dr. Synthia Innocent message.  She verbalizes understanding and will make sure both pt and pt's wife are on board.

## 2021-12-05 NOTE — Telephone Encounter (Signed)
Lvm asking Marissa (of [facility]) to call back.  Need to inform her Dr. Darnell Level is giving verbal orders for social work (see 11/16/21 phn note).

## 2021-12-07 ENCOUNTER — Telehealth: Payer: Self-pay | Admitting: Family Medicine

## 2021-12-07 ENCOUNTER — Telehealth: Payer: Self-pay

## 2021-12-07 NOTE — Telephone Encounter (Signed)
Melissa an [place] with [facility] called and since patient is going to hospice on 12/09/21 that they cant be in there with hospice and wasn't sure if Dr Darnell Level still wanted to do the eval. She had to get off the phone but said she will call back

## 2021-12-07 NOTE — Telephone Encounter (Addendum)
Please check to see if he is having any urinary symptoms such as dysuria, increased urgency and frequency, lower abdominal pain, nausea, vomiting or flank pain or worsened mental status.  If any of the symptoms, okay to do a UA with reflex to culture if abnormal.  Also touch base with wife on status of hospice evaluation given message we received last week for this.  Is patient willing to proceed with hospice evaluation?

## 2021-12-07 NOTE — Telephone Encounter (Signed)
Princess is calling from [facility], patient fell yesterday in living room, no injuries just a scrape on elbow and Princess wanted to advise they will continue with PT.

## 2021-12-07 NOTE — Telephone Encounter (Signed)
Okay to discontinue home health if hospice accepts patient.

## 2021-12-07 NOTE — Telephone Encounter (Signed)
Left a message on voicemail to call the office back. See previous message.

## 2021-12-07 NOTE — Telephone Encounter (Signed)
Princess called back in stating that the wife contact them, advised Ron fell after there visit yesterday. Also asking for verbal orders to do a UA on patient as his urine Is really dark according to wife.

## 2021-12-08 NOTE — Telephone Encounter (Signed)
Spoke with Princess of [place] asking if pt c/o urinary sxs.  Denies any sxs other than dark colored urine. I notified her Dr. Darnell Level is ok to do UA w/relfex to [facility] if abnormal. She verbalizes understanding and will have results fwd to Dr. Darnell Level.

## 2021-12-08 NOTE — Telephone Encounter (Signed)
Spoke with pt's wife, [name] (on dpr) asking about hospice eval.  States it wasn't done last week but they are coming tomorrow at 2:00 and pt is in agreement.

## 2021-12-08 NOTE — Telephone Encounter (Signed)
Lvm asking Marissa (of [facility]) to call back.  Need to inform her Dr. Darnell Level is giving verbal orders for social work (see 11/16/21 phn note).

## 2021-12-09 ENCOUNTER — Telehealth: Payer: Self-pay

## 2021-12-09 MED ORDER — FENTANYL 75 MCG/HR TD PT72: 1.0000 | MEDICATED_PATCH | TRANSDERMAL | 0 refills | Status: DC

## 2021-12-09 NOTE — Telephone Encounter (Signed)
Yes, plz and thank you.

## 2021-12-09 NOTE — Telephone Encounter (Signed)
LVM on wife's number to call back and get scheduled next Tuesday.

## 2021-12-09 NOTE — Telephone Encounter (Signed)
Name of Medication: Fentanyl Name of Pharmacy: [facility] Last Fill or Written Date and Quantity: 11/16/21, #10 patch Last Office Visit and Type: 10/04/21, AWV Next Office Visit and Type: 12/13/21, frequent falls Last Controlled Substance Agreement Date: 03/22/21  Last UDS: 03/22/21

## 2021-12-09 NOTE — Telephone Encounter (Signed)
MEDICATION: fentaNYL (DURAGESIC) 75 MCG/HR  PHARMACY: [id number] [place] [place] - [address]  Comments: Patient has two patches left. Uses them once every 3 days.   **Let patient know to contact pharmacy at the end of the day to make sure medication is ready. **  ** Please notify patient to allow 48-72 hours to process**  **Encourage patient to contact the pharmacy for refills or they can request refills through [facility]*

## 2021-12-09 NOTE — Telephone Encounter (Signed)
Agree with OV for evaluation, agree with bedrail for bed.

## 2021-12-09 NOTE — Telephone Encounter (Signed)
Pt scheduled on 12/13/21 at 12:00.

## 2021-12-09 NOTE — Telephone Encounter (Signed)
Patient's wife is calling in, scheduled for 7/18 for issue listed below but wants to be worked in sooner. Okay to use same day appointment next week to work them in?

## 2021-12-09 NOTE — Telephone Encounter (Signed)
[name] is an an OT with [facility], went to do an evaluation on the patient due to frequent falls. Patient fell twice on the 7/4 and wanted to update [place].   1st fall was when [name] was trying to get out of the bed, he slipped out hitting his trachea and head. Did not lose consciousness, but does now have a raised bruise on his trachea that is purple looking.   2nd fall was about 3 hours later, patient fell in his livingroom, he hit his R hip and now has a bruise covering almost all of his R side of his butt and his hip.   Melissa's recommendation is just for the patient to have a bed rail but wanted to update [place] about this eval.

## 2021-12-09 NOTE — Addendum Note (Signed)
Addended by: Ria Bush on: 12/09/2021 05:10 PM   Modules accepted: Orders

## 2021-12-09 NOTE — Telephone Encounter (Signed)
ERx 

## 2021-12-13 ENCOUNTER — Encounter: Payer: Self-pay | Admitting: Family Medicine

## 2021-12-13 ENCOUNTER — Ambulatory Visit (INDEPENDENT_AMBULATORY_CARE_PROVIDER_SITE_OTHER): Payer: Medicare HMO | Admitting: Family Medicine

## 2021-12-13 ENCOUNTER — Other Ambulatory Visit: Payer: Self-pay | Admitting: Family Medicine

## 2021-12-13 VITALS — BP 122/64 | HR 99 | Temp 98.7°F | Ht 67.75 in | Wt 307.0 lb

## 2021-12-13 DIAGNOSIS — G894 Chronic pain syndrome: Secondary | ICD-10-CM | POA: Diagnosis not present

## 2021-12-13 DIAGNOSIS — D696 Thrombocytopenia, unspecified: Secondary | ICD-10-CM | POA: Diagnosis not present

## 2021-12-13 DIAGNOSIS — K118 Other diseases of salivary glands: Secondary | ICD-10-CM

## 2021-12-13 DIAGNOSIS — E785 Hyperlipidemia, unspecified: Secondary | ICD-10-CM

## 2021-12-13 DIAGNOSIS — R188 Other ascites: Secondary | ICD-10-CM

## 2021-12-13 DIAGNOSIS — Z6841 Body Mass Index (BMI) 40.0 and over, adult: Secondary | ICD-10-CM

## 2021-12-13 DIAGNOSIS — Z789 Other specified health status: Secondary | ICD-10-CM

## 2021-12-13 DIAGNOSIS — G8929 Other chronic pain: Secondary | ICD-10-CM

## 2021-12-13 DIAGNOSIS — J449 Chronic obstructive pulmonary disease, unspecified: Secondary | ICD-10-CM

## 2021-12-13 DIAGNOSIS — I2510 Atherosclerotic heart disease of native coronary artery without angina pectoris: Secondary | ICD-10-CM | POA: Diagnosis not present

## 2021-12-13 DIAGNOSIS — R296 Repeated falls: Secondary | ICD-10-CM | POA: Diagnosis not present

## 2021-12-13 DIAGNOSIS — R590 Localized enlarged lymph nodes: Secondary | ICD-10-CM

## 2021-12-13 DIAGNOSIS — G4733 Obstructive sleep apnea (adult) (pediatric): Secondary | ICD-10-CM | POA: Diagnosis not present

## 2021-12-13 DIAGNOSIS — I714 Abdominal aortic aneurysm, without rupture, unspecified: Secondary | ICD-10-CM | POA: Diagnosis not present

## 2021-12-13 DIAGNOSIS — F332 Major depressive disorder, recurrent severe without psychotic features: Secondary | ICD-10-CM

## 2021-12-13 DIAGNOSIS — K7460 Unspecified cirrhosis of liver: Secondary | ICD-10-CM

## 2021-12-13 LAB — POC URINALSYSI DIPSTICK (AUTOMATED)
Blood, UA: NEGATIVE
Glucose, UA: NEGATIVE
Ketones, UA: NEGATIVE
Leukocytes, UA: NEGATIVE
Nitrite, UA: NEGATIVE
Protein, UA: POSITIVE — AB
Spec Grav, UA: <=1.005 — AB (ref 1.010–1.025)
Urobilinogen, UA: 4 E.U./dL — AB
pH, UA: >=9 — AB (ref 5.0–8.0)

## 2021-12-13 NOTE — Patient Instructions (Addendum)
Stop aspirin.  Increase lasix to 8m twice daily for 1 week and let me know how breathing does.  We will be in touch with plan for cymbalta and abilify.  Reschedule appointment with [place]. I will reorder abdominal ultrasound as well.  Keep appointment 01/04/2022.

## 2021-12-13 NOTE — Telephone Encounter (Signed)
Lvm asking Marissa (of [facility]) to call back.  Need to inform her Dr. Darnell Level is giving verbal orders for social work (see 11/16/21 phn note).

## 2021-12-13 NOTE — Progress Notes (Unsigned)
Patient ID: [name], male    DOB: Apr 13, 1956, 66 y.o.   MRN: [id number]  This visit was conducted in person.  BP 122/64   Pulse 99   Temp 98.7 F (37.1 C) (Temporal)   Ht 5' 7.75" (1.721 m)   Wt (!) 307 lb (139.3 kg)   SpO2 94%   BMI 47.02 kg/m    CC: frequent falls Subjective:   HPI: [name] is a 66 y.o. male presenting on 12/13/2021 for Fall (C/o frequent falls. Uses a cane. Waiting on walker.  Also, c/o bilateral leg/foot swelling. Pt accompanied by wife, [name]. 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), Chronic pain syndrome, COPD (chronic obstructive pulmonary disease) ([place]) (10/2011), DDD (degenerative disc disease), Disturbances of sensation of smell and taste, Dyspnea on exertion, GERD (gastroesophageal reflux disease), Gout, Headache, Hepatitis, Hidradenitis, Hidradenitis suppurativa (dx 2011   goin and leg crease), Hip osteoarthritis, History of hepatitis B (1983), History of MI (myocardial infarction), History of pneumonia, History of viral meningitis (2000), HLD (hyperlipidemia), HTN (hypertension), Ischemic cardiomyopathy, Liver cirrhosis secondary to NASH ([place]) (01/2014), Lumbar herniated disc, Myocardial infarction ([place]), Nocturia more than twice per night, Obesity, Spinal stenosis, T2DM (type 2 diabetes mellitus) ([place]), and Vitamin D deficiency.  Recently started [facility] PT, OT, SW, SN. Pending hospice evaluation.   Multiple falls over the past several weeks. 2 occurred 7/4 - fell getting out of bed, hit neck and head, then again in living room and hit R hip. Now has bruise to anterior neck and large bruise to R hip.  New bed-rail  planned for the bed.  He notes knot to neck present for weeks, even prior to fall.   He continues lasix 73m daily and spironolactone 1035mdaily.   COPD - continues spiriva daily as well as albuterol 3 pumps twice daily.   Not taking xifaxan due to cost.  He is taking lactulose once daily. Having regular bowel movements.      Relevant past medical, surgical, family and social history reviewed and updated as indicated. Interim medical history since our last visit reviewed. Allergies and medications reviewed and updated. Outpatient Medications Prior to Visit  Medication Sig Dispense Refill   acetaminophen (TYLENOL) 500 MG tablet Take 1 tablet (500 mg total) by mouth in the morning, at noon, and at bedtime.     albuterol (PROVENTIL) (2.5 MG/3ML) 0.083% nebulizer solution USE 1 VIAL PER NEBULIZER EVERY 6 HRS AS NEEDED FOR WHEEZING 75 mL 6   ARIPiprazole (ABILIFY) 5 MG tablet Take 1 tablet (5 mg total) by mouth daily. 90 tablet 3   aspirin 81 MG tablet Take 1 tablet (81 mg total) by mouth daily.     B Complex-C (SUPER B COMPLEX PO) Take 1 tablet by mouth daily.     Ca Phosphate-Cholecalciferol [phone] MG-UNIT TABS Take 1 tablet by mouth daily.      cholecalciferol 2000 units TABS Take 2,000 Units by mouth daily.     DULoxetine (CYMBALTA) 30 MG capsule Take 1 capsule (30 mg total) by mouth daily. 90 capsule 3   fentaNYL (DURAGESIC) 75 MCG/HR Place 1 patch onto the skin every 3 (three) days. 10 patch 0   ferrous sulfate 325 (65 FE) MG  tablet Take 1 tablet (325 mg total) by mouth every other day.     fluticasone (FLONASE) 50 MCG/ACT nasal spray Place 2 sprays into both nostrils daily as needed for allergies.      folic acid (FOLVITE) 1 MG tablet TAKE 1 TABLET BY MOUTH EVERY DAY 90 tablet 3   furosemide (LASIX) 40 MG tablet TAKE 1 TABLET BY MOUTH EVERY DAY 90 tablet 1   gabapentin (NEURONTIN) 300 MG capsule Take 1 capsule (300 mg total) by mouth in the morning and at bedtime.     Lactulose 20  GM/30ML SOLN Take 30 mLs (20 g total) by mouth 3 (three) times daily. 2700 mL 5   metoprolol succinate (TOPROL-XL) 25 MG 24 hr tablet Take 1 tablet (25 mg total) by mouth daily. 90 tablet 3   Misc Natural Products (TART CHERRY ADVANCED) CAPS Take 1 capsule by mouth 2 (two) times daily.     nitroGLYCERIN (NITROSTAT) 0.4 MG SL tablet Place 1 tablet (0.4 mg total) under the tongue every 5 (five) minutes as needed for chest pain. 25 tablet 12   nystatin cream (MYCOSTATIN) APPLY TO AFFECTED AREA TWICE A DAY 30 g 1   omeprazole (PRILOSEC) 40 MG capsule TAKE 1 CAPSULE BY MOUTH EVERY DAY 90 capsule 1   ondansetron (ZOFRAN) 4 MG tablet TAKE 1 TABLET BY MOUTH EVERY 8 HOURS AS NEEDED FOR NAUSEA AND VOMITING 30 tablet 1   predniSONE (DELTASONE) 20 MG tablet Take two tablets daily for 3 days followed by one tablet daily for 4 days 10 tablet 0   Red Yeast Rice 600 MG CAPS Take 2 capsules by mouth daily.     Respiratory Therapy Supplies (NEBULIZER) DEVI Use as instructed for albuterol nebulization, with necessary tubing 1 each 0   rifaximin (XIFAXAN) 550 MG TABS tablet Take 1 tablet (550 mg total) by mouth 2 (two) times daily. 60 tablet 11   spironolactone (ALDACTONE) 100 MG tablet Take 1 tablet (100 mg total) by mouth daily. 90 tablet 1   Tiotropium Bromide Monohydrate (SPIRIVA RESPIMAT) 2.5 MCG/ACT AERS Inhale 2 puffs into the lungs daily. 4 g 11   vitamin B-12 (CYANOCOBALAMIN) 500 MCG tablet Take 1 tablet (500 mcg total) by mouth every Monday, Wednesday, and Friday.     losartan (COZAAR) 25 MG tablet Take 1 tablet (25 mg total) by mouth daily. 90 tablet 3   No facility-administered medications prior to visit.     Per HPI unless specifically indicated in ROS section below Review of Systems  Objective:  BP 122/64   Pulse 99   Temp 98.7 F (37.1 C) (Temporal)   Ht 5' 7.75" (1.721 m)   Wt (!) 307 lb (139.3 kg)   SpO2 94%   BMI 47.02 kg/m   Wt Readings from Last 3 Encounters:  12/13/21 (!) 307 lb  (139.3 kg)  10/04/21 (!) 300 lb 6 oz (136.2 kg)  07/19/21 286 lb (129.7 kg)      Physical Exam    Results for orders placed or performed in visit on 12/13/21  POCT Urinalysis Dipstick (Automated)  Result Value Ref Range   Color, UA amber    Clarity, UA cloudy    Glucose, UA Negative Negative   Bilirubin, UA 1+    Ketones, UA negative    Spec Grav, UA <=1.005 (A) 1.010 - 1.025   Blood, UA negative    pH, UA >=9.0 (A) 5.0 - 8.0   Protein, UA Positive (A) Negative   Urobilinogen, UA 4.0 (  A) 0.2 or 1.0 E.U./dL   Nitrite, UA negative    Leukocytes, UA Negative Negative   *Note: Due to a large number of results and/or encounters for the requested time period, some results have not been displayed. A complete set of results can be found in Results Review.    Assessment & Plan:   Problem List Items Addressed This Visit   None Visit Diagnoses     Falls frequently    -  Primary   Relevant Orders   POCT Urinalysis Dipstick (Automated) (Completed)        No orders of the defined types were placed in this encounter.  Orders Placed This Encounter  Procedures   POCT Urinalysis Dipstick (Automated)     ***Follow up plan: No follow-ups on file.  Ria Bush, MD

## 2021-12-13 NOTE — Telephone Encounter (Signed)
Lvm at [facility] [phone]) asking them to have her call back.  Need to relay Dr. Synthia Innocent message.

## 2021-12-13 NOTE — Telephone Encounter (Signed)
[name] was just an evaluation, and does not need any verbal orders. She just wanted to inform us of her concern.

## 2021-12-14 ENCOUNTER — Ambulatory Visit: Payer: Self-pay | Admitting: Urology

## 2021-12-14 NOTE — Telephone Encounter (Signed)
ERx 

## 2021-12-14 NOTE — Telephone Encounter (Signed)
Refill request for Albuterol inhaler No longer on medication list discontinued 05/05/22 Last office visit 12/13/21 Okay to fill?

## 2021-12-14 NOTE — Telephone Encounter (Signed)
Left message on voicemail for [name] to call the office back.

## 2021-12-15 ENCOUNTER — Telehealth: Payer: Self-pay | Admitting: Family Medicine

## 2021-12-15 ENCOUNTER — Ambulatory Visit: Payer: Medicare HMO | Admitting: Urology

## 2021-12-15 DIAGNOSIS — R590 Localized enlarged lymph nodes: Secondary | ICD-10-CM | POA: Insufficient documentation

## 2021-12-15 NOTE — Assessment & Plan Note (Signed)
Increased dyspnea with wheeze. Continues spiriva respimat, but note difficulty with cost.  If increased lasix doesn't help dyspnea, consider transition to triple therapy (Breztri vs Trelegy) - would likely need patient assistance given cost.

## 2021-12-15 NOTE — Telephone Encounter (Signed)
Attempted to contact Marissa (of [facility]). Vm box is full.  Need to inform her Dr. Darnell Level is giving verbal orders for social work (see 11/16/21 phn note).

## 2021-12-15 NOTE — Assessment & Plan Note (Signed)
He is on abilify 26m daily started late 2022 and cymbalta 352m- will likely continue tapering cymbalta given liver disease. Would change to Pristiq if affordable.

## 2021-12-15 NOTE — Telephone Encounter (Signed)
Home Health verbal orders  Agency Name: [facility]  Requesting OT/PT/Skilled nursing/Social Work/Speech:mobile xray  Reason: difficulty standing,new bruising on right hip   Please forward to [facility] pool or providers CMA

## 2021-12-15 NOTE — Assessment & Plan Note (Signed)
Multiple recent falls, anticipate multifactorial including medications, deconditioning.  Continue HHPT. Planning new bedrail and walker.

## 2021-12-15 NOTE — Assessment & Plan Note (Signed)
[place] CSRS reviewed.  Continue fentanyl 1mg Q72 hours, previously unable to tolerate lower dose due to worsened pain.

## 2021-12-15 NOTE — Assessment & Plan Note (Signed)
Not using CPAP.

## 2021-12-15 NOTE — Assessment & Plan Note (Signed)
See below re parotid masses.

## 2021-12-15 NOTE — Assessment & Plan Note (Deleted)
[place] CSRS reviewed.  Continue fentanyl 84mg Q72 hours, previously unable to tolerate lower dose due to worsened pain.

## 2021-12-15 NOTE — Assessment & Plan Note (Signed)
Reviewed upcoming [facility] appt.

## 2021-12-15 NOTE — Assessment & Plan Note (Signed)
Previous weight loss, now weight continues increasing. See below regarding increased lasix x 1 wk.

## 2021-12-15 NOTE — Assessment & Plan Note (Addendum)
H/o likely NASH cirrhosis, missed recent GI visit and abd [place]. Will provide # to call and reschedule abd [place] at [place] - [phone], also recommend GI f/u.

## 2021-12-15 NOTE — Telephone Encounter (Signed)
Agree with mobile xray.  If I want to order labs is HH able to do this as well?

## 2021-12-15 NOTE — Assessment & Plan Note (Signed)
Cirrhosis related.  Given marked thrombocytopenia and frequent falls, recommend stop aspirin. Discussed possible increased cardiovascular risk of stopping aspirin.

## 2021-12-15 NOTE — Assessment & Plan Note (Signed)
H/o this, thought benign warthin's parotid tumors by CT 08/2020.  Now with possible LAD over last few weeks, in known dental disease.  Advised let us know if not resolved over next 2-3 wks to consider further neck imaging.

## 2021-12-15 NOTE — Assessment & Plan Note (Addendum)
Monitor off aspirin - stopped 12/2021 due to low plt <50, easy bruising and frequent falls. Continue metoprolol succinate and losartan. Advised schedule f/u with cardiology.

## 2021-12-15 NOTE — Assessment & Plan Note (Signed)
He had stopped RYR - advised restart at least 644m once daily. Otherwise statin intolerance.

## 2021-12-16 MED ORDER — DESVENLAFAXINE SUCCINATE ER 25 MG PO TB24: 25.0000 mg | ORAL_TABLET | Freq: Every day | ORAL | 6 refills | Status: DC

## 2021-12-16 NOTE — Telephone Encounter (Signed)
Spoke with Princess informing her Dr. Darnell Level agrees with mobile x-ray.  She states HH could probably do the labs but is asking which labs specifically.

## 2021-12-16 NOTE — Telephone Encounter (Signed)
Spoke [name] relaying Dr. Synthia Innocent message.  She verbalizes understanding.  States pt took Cymbalta today.  She will go pick up Pristiq now so pt can start it tomorrow.  They will let Dr. Darnell Level know how pt does.

## 2021-12-16 NOTE — Telephone Encounter (Signed)
Lvm at [facility] [phone]) asking them to have her call back.  Need to relay Dr. Synthia Innocent message.

## 2021-12-16 NOTE — Telephone Encounter (Addendum)
He's currently on cymbalta 27m daily. Recommend fill Pristiq low dose 226mdaily - once he has this medicine, take final cymbalta one day and the next day start pristiq in its place.  I've sent in low dose pristiq (desvenlafaxine) to ensure tolerated, if doing well, will titrate dose up.

## 2021-12-16 NOTE — Addendum Note (Signed)
Addended by: Ria Bush on: 12/16/2021 04:30 PM   Modules accepted: Orders

## 2021-12-16 NOTE — Telephone Encounter (Signed)
Spoke with Princess relaying Dr. Synthia Innocent message about lab orders.  Princess documented the request and will fwd to [facility] nurse to have done.  Spoke with pt's wife, [name] (on dpr), relaying Dr. Synthia Innocent message about transitioning from Cymbalta to [place].  States they recall the discussion and are willing to try new med.

## 2021-12-16 NOTE — Telephone Encounter (Addendum)
CBC, CMP for liver cirrhosis K47.60.  Also - we briefly discussed at [place] but are they willing to transition from cymbalta to different antidepressant that is better for liver (desvenlafaxine - Pristiq)?

## 2021-12-16 NOTE — Telephone Encounter (Signed)
Attempted to contact [name] (of [facility]). Vm box is full.  Need to inform her Dr. Darnell Level is giving verbal orders for social work (see 11/16/21 phn note).

## 2021-12-19 DIAGNOSIS — S7001XA Contusion of right hip, initial encounter: Secondary | ICD-10-CM | POA: Diagnosis not present

## 2021-12-19 DIAGNOSIS — M25551 Pain in right hip: Secondary | ICD-10-CM | POA: Diagnosis not present

## 2021-12-19 DIAGNOSIS — W19XXXA Unspecified fall, initial encounter: Secondary | ICD-10-CM | POA: Diagnosis not present

## 2021-12-19 NOTE — Telephone Encounter (Signed)
Lvm at [facility] [phone]) asking them to have her call back.  Need to relay Dr. Synthia Innocent message.

## 2021-12-19 NOTE — Telephone Encounter (Signed)
Attempted to contact [name] (of [facility]). Vm box is full.  Need to inform her Dr. Darnell Level is giving verbal orders for social work (see 11/16/21 phn note).

## 2021-12-20 ENCOUNTER — Ambulatory Visit: Payer: Medicare HMO | Admitting: Family Medicine

## 2021-12-20 ENCOUNTER — Telehealth: Payer: Self-pay | Admitting: Family Medicine

## 2021-12-20 NOTE — Telephone Encounter (Signed)
Left another message on voicemail to call the office back. Several messages have been left without home health returning the call.

## 2021-12-20 NOTE — Telephone Encounter (Signed)
Eleacha from [facility] called is returning call for patient. Call back number [phone].

## 2021-12-20 NOTE — Telephone Encounter (Signed)
Left message on voicemail that we received a call from their office almost two weeks ago. Advised that we have left several messages to call the office back.

## 2021-12-20 NOTE — Telephone Encounter (Signed)
Lvm rtn call to [facility]. See 7/3 & 7/5 phn notes concerning pt for verbal orders.

## 2021-12-21 ENCOUNTER — Telehealth: Payer: Self-pay | Admitting: Family Medicine

## 2021-12-21 DIAGNOSIS — K7460 Unspecified cirrhosis of liver: Secondary | ICD-10-CM

## 2021-12-21 NOTE — Telephone Encounter (Signed)
Eletha (?) rtn call. I relayed Dr. Synthia Innocent messages from previous phn notes (see 7/3 & 7/5 phn notes).  She verbalizes understanding and will fwd to [facility].  She apologizes for the delay in response.

## 2021-12-21 NOTE — Telephone Encounter (Signed)
Message relayed. (See 12/20/21 phn note.)

## 2021-12-21 NOTE — Telephone Encounter (Signed)
Dr. Darnell Level, let me know once you place the lab orders and I'll call pt to schedule a visit.

## 2021-12-21 NOTE — Addendum Note (Signed)
Addended by: Ria Bush on: 12/21/2021 05:16 PM   Modules accepted: Orders

## 2021-12-21 NOTE — Telephone Encounter (Signed)
Labs ordered.

## 2021-12-21 NOTE — Telephone Encounter (Signed)
[name] from [facility] called in stating they attempted to draw blood from [name] but was unsuccessful. She stated that both veins had blew and that he will most likely need to come into the office for labs. Please advise. Thank you.

## 2021-12-22 NOTE — Telephone Encounter (Signed)
Spoke with pt's wife, [name] (on dpr), scheduling lab visit on 12/23/21 at 11:00.

## 2021-12-23 ENCOUNTER — Other Ambulatory Visit: Payer: Medicare HMO

## 2021-12-23 NOTE — Telephone Encounter (Addendum)
Left message on vm per dpr for pt/pt's wife, [name] (on dpr), relaying R hip x-ray results, per Dr. Darnell Level.

## 2021-12-23 NOTE — Telephone Encounter (Signed)
Received results of right hip x-ray done July 19. Please notify patient/wife hip x-ray returned showing stable hip replacement without new fractures, loosening, or other complication.

## 2022-01-03 ENCOUNTER — Telehealth: Payer: Self-pay | Admitting: Family Medicine

## 2022-01-03 NOTE — Telephone Encounter (Signed)
[name] from [facility] and both leg weeping clear for about a week and abrasives on his left shin. Call back number [phone].

## 2022-01-04 ENCOUNTER — Encounter: Payer: Self-pay | Admitting: Family Medicine

## 2022-01-04 ENCOUNTER — Ambulatory Visit (INDEPENDENT_AMBULATORY_CARE_PROVIDER_SITE_OTHER): Payer: Medicare HMO | Admitting: Family Medicine

## 2022-01-04 VITALS — BP 134/70 | HR 98 | Temp 98.0°F | Ht 67.25 in | Wt 312.1 lb

## 2022-01-04 DIAGNOSIS — R627 Adult failure to thrive: Secondary | ICD-10-CM

## 2022-01-04 DIAGNOSIS — D696 Thrombocytopenia, unspecified: Secondary | ICD-10-CM

## 2022-01-04 DIAGNOSIS — R600 Localized edema: Secondary | ICD-10-CM | POA: Diagnosis not present

## 2022-01-04 DIAGNOSIS — K7460 Unspecified cirrhosis of liver: Secondary | ICD-10-CM

## 2022-01-04 DIAGNOSIS — F332 Major depressive disorder, recurrent severe without psychotic features: Secondary | ICD-10-CM | POA: Diagnosis not present

## 2022-01-04 DIAGNOSIS — R188 Other ascites: Secondary | ICD-10-CM

## 2022-01-04 LAB — COMPREHENSIVE METABOLIC PANEL
ALT: 22 U/L (ref 0–53)
AST: 33 U/L (ref 0–37)
Albumin: 2.7 g/dL — ABNORMAL LOW (ref 3.5–5.2)
Alkaline Phosphatase: 189 U/L — ABNORMAL HIGH (ref 39–117)
BUN: 15 mg/dL (ref 6–23)
CO2: 28 mEq/L (ref 19–32)
Calcium: 8.5 mg/dL (ref 8.4–10.5)
Chloride: 104 mEq/L (ref 96–112)
Creatinine, Ser: 0.9 mg/dL (ref 0.40–1.50)
GFR: 88.79 mL/min (ref >60.00)
Glucose, Bld: 92 mg/dL (ref 70–99)
Potassium: 4.8 mEq/L (ref 3.5–5.1)
Sodium: 136 mEq/L (ref 135–145)
Total Bilirubin: 1.3 mg/dL — ABNORMAL HIGH (ref 0.2–1.2)
Total Protein: 7.2 g/dL (ref 6.0–8.3)

## 2022-01-04 LAB — CBC WITH DIFFERENTIAL/PLATELET
Basophils Absolute: 0 10*3/uL (ref 0.0–0.1)
Basophils Relative: 0.6 % (ref 0.0–3.0)
Eosinophils Absolute: 0.2 10*3/uL (ref 0.0–0.7)
Eosinophils Relative: 4 % (ref 0.0–5.0)
HCT: 31.4 % — ABNORMAL LOW (ref 39.0–52.0)
Hemoglobin: 10.7 g/dL — ABNORMAL LOW (ref 13.0–17.0)
Lymphocytes Relative: 15.2 % (ref 12.0–46.0)
Lymphs Abs: 0.7 10*3/uL (ref 0.7–4.0)
MCHC: 34.1 g/dL (ref 30.0–36.0)
MCV: 100.7 fl — ABNORMAL HIGH (ref 78.0–100.0)
Monocytes Absolute: 0.4 10*3/uL (ref 0.1–1.0)
Monocytes Relative: 8.9 % (ref 3.0–12.0)
Neutro Abs: 3.2 10*3/uL (ref 1.4–7.7)
Neutrophils Relative %: 71.3 % (ref 43.0–77.0)
Platelets: 51 10*3/uL — ABNORMAL LOW (ref 150.0–400.0)
RBC: 3.12 Mil/uL — ABNORMAL LOW (ref 4.22–5.81)
RDW: 17.2 % — ABNORMAL HIGH (ref 11.5–15.5)
WBC: 4.6 10*3/uL (ref 4.0–10.5)

## 2022-01-04 LAB — PROTIME-INR
INR: 1.2 ratio — ABNORMAL HIGH (ref 0.8–1.0)
Prothrombin Time: 13.4 s — ABNORMAL HIGH (ref 9.6–13.1)

## 2022-01-04 LAB — AMMONIA: Ammonia: 44 umol/L — ABNORMAL HIGH (ref 11–35)

## 2022-01-04 MED ORDER — FUROSEMIDE 40 MG PO TABS
40.0000 mg | ORAL_TABLET | Freq: Two times a day (BID) | ORAL | 1 refills | Status: DC
Start: 2022-01-04 — End: 2022-05-09

## 2022-01-04 NOTE — Progress Notes (Signed)
Patient ID: [name], male    DOB: Nov 05, 1955, 66 y.o.   MRN: [id number]  This visit was conducted in person.  BP 134/70   Pulse 98   Temp 98 F (36.7 C) (Temporal)   Ht 5' 7.25" (1.708 m)   Wt (!) 312 lb 2 oz (141.6 kg)   SpO2 97%   BMI 48.52 kg/m    CC: follow up visit  Subjective:   HPI: [name] is a 66 y.o. male presenting on 01/04/2022 for Follow-up (Here for 2 mo f/u. Pt accompanied by wife, [name]. )   See prior note for details.  [facility] established with patient.  Was unable to get labs drawn by [facility] - unsuccessful attempt.  Weight gain noted, leg swelling noted with abrasions to left anterior leg.   Lasix BID may have helped but only took for 1 wk.   No fall in the past 3 wks.      Relevant past medical, surgical, family and social history reviewed and updated as indicated. Interim medical history since our last visit reviewed. Allergies and medications reviewed and updated. Outpatient Medications Prior to Visit  Medication Sig Dispense Refill   acetaminophen (TYLENOL) 500 MG tablet Take 1 tablet (500 mg total) by mouth in the morning, at noon, and at bedtime.     albuterol (PROVENTIL) (2.5 MG/3ML) 0.083% nebulizer solution USE 1 VIAL PER NEBULIZER EVERY 6 HRS AS NEEDED FOR WHEEZING 75 mL 6   albuterol (VENTOLIN HFA) 108 (90 Base) MCG/ACT inhaler TAKE 2 PUFFS BY MOUTH EVERY 6 HOURS AS NEEDED FOR WHEEZE OR SHORTNESS OF BREATH 6.7 each 00   ARIPiprazole (ABILIFY) 5 MG tablet Take 1 tablet (5 mg total) by mouth daily. 90 tablet 3   B Complex-C (SUPER B COMPLEX PO) Take 1 tablet by mouth daily.     Ca Phosphate-Cholecalciferol [phone] MG-UNIT TABS Take 1 tablet by mouth daily.      cholecalciferol 2000 units TABS Take 2,000 Units by mouth daily.     desvenlafaxine (PRISTIQ) 25 MG 24 hr tablet Take 1 tablet (25 mg total) by mouth daily. 30 tablet 6   fentaNYL (DURAGESIC) 75 MCG/HR Place 1 patch onto the skin every 3 (three) days. 10 patch 0    ferrous sulfate 325 (65 FE) MG tablet Take 1 tablet (325 mg total) by mouth every other day.     fluticasone (FLONASE) 50 MCG/ACT nasal spray Place 2 sprays into both nostrils daily as needed for allergies.      folic acid (FOLVITE) 1 MG tablet TAKE 1 TABLET BY MOUTH EVERY DAY 90 tablet 3   gabapentin (NEURONTIN) 300 MG capsule Take 1 capsule (300 mg total) by mouth in the morning and at bedtime.     Lactulose 20 GM/30ML SOLN Take 30 mLs (20 g total) by mouth 3 (three) times daily. 2700 mL 5   metoprolol succinate (TOPROL-XL) 25 MG 24 hr tablet Take 1 tablet (25 mg total) by mouth daily. 90 tablet 3   Misc Natural Products (TART CHERRY ADVANCED) CAPS Take 1 capsule by mouth 2 (two) times daily.     nitroGLYCERIN (NITROSTAT) 0.4 MG SL tablet Place 1 tablet (0.4 mg total) under the tongue every 5 (five) minutes as needed for chest pain. 25 tablet 12   nystatin cream (MYCOSTATIN) APPLY TO AFFECTED AREA TWICE A DAY 30 g 1   omeprazole (PRILOSEC) 40 MG capsule TAKE 1 CAPSULE BY MOUTH EVERY DAY 90 capsule 1  ondansetron (ZOFRAN) 4 MG tablet TAKE 1 TABLET BY MOUTH EVERY 8 HOURS AS NEEDED FOR NAUSEA AND VOMITING 30 tablet 1   Red Yeast Rice 600 MG CAPS Take 2 capsules by mouth daily.     Respiratory Therapy Supplies (NEBULIZER) DEVI Use as instructed for albuterol nebulization, with necessary tubing 1 each 0   spironolactone (ALDACTONE) 100 MG tablet Take 1 tablet (100 mg total) by mouth daily. 90 tablet 1   Tiotropium Bromide Monohydrate (SPIRIVA RESPIMAT) 2.5 MCG/ACT AERS Inhale 2 puffs into the lungs daily. 4 g 11   vitamin B-12 (CYANOCOBALAMIN) 500 MCG tablet Take 1 tablet (500 mcg total) by mouth every Monday, Wednesday, and Friday.     furosemide (LASIX) 40 MG tablet TAKE 1 TABLET BY MOUTH EVERY DAY 90 tablet 1   losartan (COZAAR) 25 MG tablet Take 1 tablet (25 mg total) by mouth daily. 90 tablet 3   No facility-administered medications prior to visit.     Per HPI unless specifically indicated  in ROS section below Review of Systems  Objective:  BP 134/70   Pulse 98   Temp 98 F (36.7 C) (Temporal)   Ht 5' 7.25" (1.708 m)   Wt (!) 312 lb 2 oz (141.6 kg)   SpO2 97%   BMI 48.52 kg/m   Wt Readings from Last 3 Encounters:  01/04/22 (!) 312 lb 2 oz (141.6 kg)  12/13/21 (!) 307 lb (139.3 kg)  10/04/21 (!) 300 lb 6 oz (136.2 kg)      Physical Exam Vitals and nursing note reviewed.  Constitutional:      Appearance: Normal appearance. He is obese. He is not ill-appearing.     Comments: Ambulates with cane  Cardiovascular:     Rate and Rhythm: Normal rate and regular rhythm.     Pulses: Normal pulses.     Heart sounds: Normal heart sounds. No murmur heard. Pulmonary:     Effort: Pulmonary effort is normal. No respiratory distress.     Breath sounds: Wheezing (mild lower lobes) present. No rhonchi or rales.     Comments: Coarse breath sounds Musculoskeletal:        General: Swelling and tenderness present.     Right lower leg: Edema present.     Left lower leg: Edema present.  Skin:    General: Skin is warm and dry.     Findings: Wound present. No rash.     Comments: Shallow sores to L anterior lower leg   Neurological:     Mental Status: He is alert.  Psychiatric:        Mood and Affect: Mood normal.        Behavior: Behavior normal.       Results for orders placed or performed in visit on 01/04/22  Ammonia  Result Value Ref Range   Ammonia 44 (H) 11 - 35 umol/L  Protime-INR  Result Value Ref Range   INR 1.2 (H) 0.8 - 1.0 ratio   Prothrombin Time 13.4 (H) 9.6 - 13.1 sec  Comprehensive metabolic panel  Result Value Ref Range   Sodium 136 135 - 145 mEq/L   Potassium 4.8 3.5 - 5.1 mEq/L   Chloride 104 96 - 112 mEq/L   CO2 28 19 - 32 mEq/L   Glucose, Bld 92 70 - 99 mg/dL   BUN 15 6 - 23 mg/dL   Creatinine, Ser 0.90 0.40 - 1.50 mg/dL   Total Bilirubin 1.3 (H) 0.2 - 1.2 mg/dL   Alkaline Phosphatase  189 (H) 39 - 117 U/L   AST 33 0 - 37 U/L   ALT 22 0 - 53  U/L   Total Protein 7.2 6.0 - 8.3 g/dL   Albumin 2.7 (L) 3.5 - 5.2 g/dL   GFR 88.79 >60.00 mL/min   Calcium 8.5 8.4 - 10.5 mg/dL  CBC with Differential/Platelet  Result Value Ref Range   WBC 4.6 4.0 - 10.5 K/uL   RBC 3.12 (L) 4.22 - 5.81 Mil/uL   Hemoglobin 10.7 (L) 13.0 - 17.0 g/dL   HCT 31.4 (L) 39.0 - 52.0 %   MCV 100.7 (H) 78.0 - 100.0 fl   MCHC 34.1 30.0 - 36.0 g/dL   RDW 17.2 (H) 11.5 - 15.5 %   Platelets 51.0 (L) 150.0 - 400.0 K/uL   Neutrophils Relative % 71.3 43.0 - 77.0 %   Lymphocytes Relative 15.2 12.0 - 46.0 %   Monocytes Relative 8.9 3.0 - 12.0 %   Eosinophils Relative 4.0 0.0 - 5.0 %   Basophils Relative 0.6 0.0 - 3.0 %   Neutro Abs 3.2 1.4 - 7.7 K/uL   Lymphs Abs 0.7 0.7 - 4.0 K/uL   Monocytes Absolute 0.4 0.1 - 1.0 K/uL   Eosinophils Absolute 0.2 0.0 - 0.7 K/uL   Basophils Absolute 0.0 0.0 - 0.1 K/uL   *Note: Due to a large number of results and/or encounters for the requested time period, some results have not been displayed. A complete set of results can be found in Results Review.    Assessment & Plan:   Problem List Items Addressed This Visit     Cirrhosis of liver with ascites ([place])    Update labs today. This contributes to pedal edema.  Weight gain noted - he tolerated lasix BID x 1 wk - will start this as new regimen. I asked him to return in 1 wk for rpt labs on higher lasix and recheck legs.  MELD 3.0: 11 at 01/04/2022 12:41 PM MELD-Na: 9 at 01/04/2022 12:41 PM Calculated from: Serum Creatinine: 0.90 mg/dL (Using min of 1 mg/dL) at 01/04/2022 12:41 PM Serum Sodium: 136 mEq/L at 01/04/2022 12:41 PM Total Bilirubin: 1.3 mg/dL at 01/04/2022 12:41 PM Serum Albumin: 2.7 g/dL at 01/04/2022 12:41 PM INR(ratio): 1.2 ratio at 01/04/2022 12:41 PM Age at listing (hypothetical): 52 years Sex: Male at 01/04/2022 12:41 PM       Thrombocytopenia ([place])    Cirrhosis related - update CBC.       MDD (major depressive disorder), recurrent severe, without psychosis ([place])     Overall stable period on abilify 11m daily and recent addition of pristiq 256mdaily in place of cymbalta - better in liver disease. Discussed option to titrate dose - will monitor for now.       FTT (failure to thrive) in adult    [facility]       Pedal edema    Weight gain noted. This has worsened pedal edema, as well as new abrasions to anterior LLE shin. Legs are weeping as well, but without evidence of infection. Will place unna boot on left leg and ask him to return in 1 wk for unna replacement.  He tolerated lasix BID x 1 wk - will start this as new regimen. I asked him to return in 1 wk for rpt labs on higher lasix and recheck legs.         Meds ordered this encounter  Medications   furosemide (LASIX) 40 MG  tablet    Sig: Take 1 tablet (40 mg total) by mouth 2 (two) times daily.    Dispense:  180 tablet    Refill:  1    Note new dose   No orders of the defined types were placed in this encounter.    Patient Instructions  Labs today  Increase lasix 22m to twice daily.  Left unna boot placement today.  Return in 1 week for follow up visit of leg swelling.   Follow up plan: Return in about 1 week (around 01/11/2022), or if symptoms worsen or fail to improve.  JRia Bush MD

## 2022-01-04 NOTE — Telephone Encounter (Signed)
Seen in office today  

## 2022-01-04 NOTE — Patient Instructions (Addendum)
Labs today  Increase lasix 40m to twice daily.  Left unna boot placement today.  Return in 1 week for follow up visit of leg swelling.

## 2022-01-05 ENCOUNTER — Encounter: Payer: Self-pay | Admitting: Family Medicine

## 2022-01-05 NOTE — Assessment & Plan Note (Addendum)
Update labs today. This contributes to pedal edema.  Weight gain noted - he tolerated lasix BID x 1 wk - will start this as new regimen. I asked him to return in 1 wk for rpt labs on higher lasix and recheck legs.  MELD 3.0: 11 at 01/04/2022 12:41 PM MELD-Na: 9 at 01/04/2022 12:41 PM Calculated from: Serum Creatinine: 0.90 mg/dL (Using min of 1 mg/dL) at 01/04/2022 12:41 PM Serum Sodium: 136 mEq/L at 01/04/2022 12:41 PM Total Bilirubin: 1.3 mg/dL at 01/04/2022 12:41 PM Serum Albumin: 2.7 g/dL at 01/04/2022 12:41 PM INR(ratio): 1.2 ratio at 01/04/2022 12:41 PM Age at listing (hypothetical): 66 years Sex: Male at 01/04/2022 12:41 PM

## 2022-01-05 NOTE — Assessment & Plan Note (Signed)
Cirrhosis related - update CBC.

## 2022-01-06 DIAGNOSIS — L97929 Non-pressure chronic ulcer of unspecified part of left lower leg with unspecified severity: Secondary | ICD-10-CM | POA: Insufficient documentation

## 2022-01-06 DIAGNOSIS — R600 Localized edema: Secondary | ICD-10-CM | POA: Insufficient documentation

## 2022-01-06 NOTE — Assessment & Plan Note (Signed)
Overall stable period at home, [facility] involved.

## 2022-01-06 NOTE — Assessment & Plan Note (Signed)
Overall stable period on abilify 40m daily and recent addition of pristiq 22mdaily in place of cymbalta - better in liver disease. Discussed option to titrate dose - will monitor for now.

## 2022-01-06 NOTE — Assessment & Plan Note (Signed)
Weight gain noted. This has worsened pedal edema, as well as new abrasions to anterior LLE shin. Legs are weeping as well, but without evidence of infection. Will place unna boot on left leg and ask him to return in 1 wk for unna replacement.  He tolerated lasix BID x 1 wk - will start this as new regimen. I asked him to return in 1 wk for rpt labs on higher lasix and recheck legs.

## 2022-01-08 ENCOUNTER — Other Ambulatory Visit: Payer: Self-pay | Admitting: Gastroenterology

## 2022-01-08 ENCOUNTER — Other Ambulatory Visit: Payer: Self-pay | Admitting: Family Medicine

## 2022-01-11 ENCOUNTER — Ambulatory Visit: Payer: Medicare HMO | Admitting: Family Medicine

## 2022-01-11 ENCOUNTER — Encounter: Payer: Self-pay | Admitting: Family Medicine

## 2022-01-11 VITALS — BP 134/62 | HR 89 | Temp 98.2°F | Ht 67.25 in | Wt 316.1 lb

## 2022-01-11 DIAGNOSIS — R188 Other ascites: Secondary | ICD-10-CM

## 2022-01-11 DIAGNOSIS — I83892 Varicose veins of left lower extremities with other complications: Secondary | ICD-10-CM

## 2022-01-11 DIAGNOSIS — I83029 Varicose veins of left lower extremity with ulcer of unspecified site: Secondary | ICD-10-CM

## 2022-01-11 DIAGNOSIS — K7460 Unspecified cirrhosis of liver: Secondary | ICD-10-CM | POA: Diagnosis not present

## 2022-01-11 DIAGNOSIS — E440 Moderate protein-calorie malnutrition: Secondary | ICD-10-CM | POA: Diagnosis not present

## 2022-01-11 DIAGNOSIS — J441 Chronic obstructive pulmonary disease with (acute) exacerbation: Secondary | ICD-10-CM | POA: Diagnosis not present

## 2022-01-11 DIAGNOSIS — G8929 Other chronic pain: Secondary | ICD-10-CM

## 2022-01-11 DIAGNOSIS — R0609 Other forms of dyspnea: Secondary | ICD-10-CM

## 2022-01-11 DIAGNOSIS — R627 Adult failure to thrive: Secondary | ICD-10-CM

## 2022-01-11 DIAGNOSIS — D696 Thrombocytopenia, unspecified: Secondary | ICD-10-CM | POA: Diagnosis not present

## 2022-01-11 DIAGNOSIS — F17200 Nicotine dependence, unspecified, uncomplicated: Secondary | ICD-10-CM

## 2022-01-11 DIAGNOSIS — R600 Localized edema: Secondary | ICD-10-CM

## 2022-01-11 DIAGNOSIS — L97929 Non-pressure chronic ulcer of unspecified part of left lower leg with unspecified severity: Secondary | ICD-10-CM

## 2022-01-11 MED ORDER — PREDNISONE 20 MG PO TABS: ORAL_TABLET | ORAL | 0 refills | Status: DC

## 2022-01-11 MED ORDER — FENTANYL 75 MCG/HR TD PT72
1.0000 | MEDICATED_PATCH | TRANSDERMAL | 0 refills | Status: DC
Start: 2022-01-11 — End: 2022-02-07

## 2022-01-11 MED ORDER — DOXYCYCLINE HYCLATE 100 MG PO TABS: 100.0000 mg | ORAL_TABLET | Freq: Two times a day (BID) | ORAL | 0 refills | Status: DC

## 2022-01-11 NOTE — Patient Instructions (Addendum)
Take prednisone and doxycycline course for breathing as well as for left leg.  Unna boot placed again - return in 1 week for recheck unna boot.  Increase lasix to 69m twice daily (2 tablets twice daily) for the next week.  [facility] [phone] schedule an abdominal ultrasound.   Low-Sodium Eating Plan Sodium, which is an element that makes up salt, helps you maintain a healthy balance of fluids in your body. Too much sodium can increase your blood pressure and cause fluid and waste to be held in your body. Your health care provider or dietitian may recommend following this plan if you have high blood pressure (hypertension), kidney disease, liver disease, or heart failure. Eating less sodium can help lower your blood pressure, reduce swelling, and protect your heart, liver, and kidneys. What are tips for following this plan? Reading food labels The Nutrition Facts label lists the amount of sodium in one serving of the food. If you eat more than one serving, you must multiply the listed amount of sodium by the number of servings. Choose foods with less than 140 mg of sodium per serving. Avoid foods with 300 mg of sodium or more per serving. Shopping  Look for lower-sodium products, often labeled as "low-sodium" or "no salt added." Always check the sodium content, even if foods are labeled as "unsalted" or "no salt added." Buy fresh foods. Avoid canned foods and pre-made or frozen meals. Avoid canned, cured, or processed meats. Buy breads that have less than 80 mg of sodium per slice. Cooking  Eat more home-cooked food and less restaurant, buffet, and fast food. Avoid adding salt when cooking. Use salt-free seasonings or herbs instead of table salt or sea salt. Check with your health care provider or pharmacist before using salt substitutes. Cook with plant-based oils, such as canola, sunflower, or olive oil. Meal planning When eating at a restaurant,  ask that your food be prepared with less salt or no salt, if possible. Avoid dishes labeled as brined, pickled, cured, smoked, or made with soy sauce, miso, or teriyaki sauce. Avoid foods that contain MSG (monosodium glutamate). MSG is sometimes added to [place], bouillon, and some canned foods. Make meals that can be grilled, baked, poached, roasted, or steamed. These are generally made with less sodium. General information Most people on this plan should limit their sodium intake to 1,500-2,000 mg (milligrams) of sodium each day. What foods should I eat? Fruits Fresh, frozen, or canned fruit. Fruit juice. Vegetables Fresh or frozen vegetables. "No salt added" canned vegetables. "No salt added" tomato sauce and paste. Low-sodium or reduced-sodium tomato and vegetable juice. Grains Low-sodium cereals, including oats, puffed wheat and rice, and shredded wheat. Low-sodium crackers. Unsalted rice. Unsalted pasta. Low-sodium bread. Whole-grain breads and whole-grain pasta. Meats and other proteins Fresh or frozen (no salt added) meat, poultry, seafood, and fish. Low-sodium canned tuna and salmon. Unsalted nuts. Dried peas, beans, and lentils without added salt. Unsalted canned beans. Eggs. Unsalted nut butters. Dairy Milk. Soy milk. Cheese that is naturally low in sodium, such as ricotta cheese, fresh mozzarella, or Swiss cheese. Low-sodium or reduced-sodium cheese. Cream cheese. Yogurt. Seasonings and condiments Fresh and dried herbs and spices. Salt-free seasonings. Low-sodium mustard and ketchup. Sodium-free salad dressing. Sodium-free light mayonnaise. Fresh or refrigerated horseradish. Lemon juice. Vinegar. Other foods Homemade, reduced-sodium, or low-sodium soups. Unsalted popcorn and pretzels. Low-salt or salt-free chips. The items listed above may not be a complete list of foods and  beverages you can eat. Contact a dietitian for more information. What foods should I  avoid? Vegetables Sauerkraut, pickled vegetables, and relishes. Olives. [place] fries. Onion rings. Regular canned vegetables (not low-sodium or reduced-sodium). Regular canned tomato sauce and paste (not low-sodium or reduced-sodium). Regular tomato and vegetable juice (not low-sodium or reduced-sodium). Frozen vegetables in sauces. Grains Instant hot cereals. Bread stuffing, pancake, and biscuit mixes. Croutons. Seasoned rice or pasta mixes. Noodle soup cups. Boxed or frozen macaroni and cheese. Regular salted crackers. Self-rising flour. Meats and other proteins Meat or fish that is salted, canned, smoked, spiced, or pickled. Precooked or cured meat, such as sausages or meat loaves. [name]. Ham. Pepperoni. Hot dogs. Corned beef. Chipped beef. Salt pork. Jerky. Pickled herring. Anchovies and sardines. Regular canned tuna. Salted nuts. Dairy Processed cheese and cheese spreads. Hard cheeses. Cheese curds. Blue cheese. Feta cheese. String cheese. Regular cottage cheese. Buttermilk. Canned milk. Fats and oils Salted butter. Regular margarine. Ghee. Bacon fat. Seasonings and condiments Onion salt, garlic salt, seasoned salt, table salt, and sea salt. Canned and packaged gravies. Worcestershire sauce. Tartar sauce. Barbecue sauce. Teriyaki sauce. Soy sauce, including reduced-sodium. Steak sauce. Fish sauce. Oyster sauce. Cocktail sauce. Horseradish that you find on the shelf. Regular ketchup and mustard. Meat flavorings and tenderizers. Bouillon cubes. Hot sauce. Pre-made or packaged marinades. Pre-made or packaged taco seasonings. Relishes. Regular salad dressings. Salsa. Other foods Salted popcorn and pretzels. Corn chips and puffs. Potato and tortilla chips. Canned or dried soups. Pizza. Frozen entrees and pot pies. The items listed above may not be a complete list of foods and beverages you should avoid. Contact a dietitian for more information. Summary Eating less sodium can help lower your blood  pressure, reduce swelling, and protect your heart, liver, and kidneys. Most people on this plan should limit their sodium intake to 1,500-2,000 mg (milligrams) of sodium each day. Canned, boxed, and frozen foods are high in sodium. Restaurant foods, fast foods, and pizza are also very high in sodium. You also get sodium by adding salt to food. Try to cook at home, eat more fresh fruits and vegetables, and eat less fast food and canned, processed, or prepared foods. This information is not intended to replace advice given to you by your health care provider. Make sure you discuss any questions you have with your health care provider. Document Revised: 06/27/2019 Document Reviewed: 04/23/2019 Elsevier Patient Education  [place].

## 2022-01-11 NOTE — Progress Notes (Unsigned)
Patient ID: [name], male    DOB: [birth year], 66 y.o.   MRN: [id number]  This visit was conducted in person.  BP 134/62   Pulse 89   Temp 98.2 F (36.8 C) (Temporal)   Ht 5' 7.25" (1.708 m)   Wt (!) 316 lb 2 oz (143.4 kg)   SpO2 95%   BMI 49.14 kg/m    CC: check leg swelling  Subjective:   HPI: [name] is a 66 y.o. male presenting on 01/11/2022 for Leg Swelling (Here for 1 wk f/u. Pt accompanied by wife, [name]. )   See prior note for details - last visit placed in LLE unna boot for swelling, anterior lower leg sores, and weeping legs.   Progressive weight gain despite new BID lasix 45m dosing. Also on spironolactone 1097mdaily.   High sodium diet continues - likes bologna, cheese, canned soups, pepsi/coke.   Dyspnea and wheezing at rest, significant cough, productive - continued smoker 1 ppd. Continues spiriva 2 puffs daily, continues albuterol 2 puffs several times a day.   [facility] - they state no longer coming out.  He may be interested in hospice evaluation.      Relevant past medical, surgical, family and social history reviewed and updated as indicated. Interim medical history since our last visit reviewed. Allergies and medications reviewed and updated. Outpatient Medications Prior to Visit  Medication Sig Dispense Refill   acetaminophen (TYLENOL) 500 MG tablet Take 1 tablet (500 mg total) by mouth in the morning, at noon, and at bedtime.     albuterol (PROVENTIL) (2.5 MG/3ML) 0.083% nebulizer solution USE 1 VIAL PER NEBULIZER EVERY 6 HRS AS NEEDED FOR WHEEZING 75 mL 6   albuterol (VENTOLIN HFA) 108 (90 Base) MCG/ACT inhaler TAKE 2 PUFFS BY MOUTH EVERY 6 HOURS AS NEEDED FOR WHEEZE OR SHORTNESS OF BREATH 6.7 each 00   ARIPiprazole (ABILIFY) 5 MG tablet Take 1 tablet (5 mg total) by mouth daily. 90 tablet 3   B Complex-C (SUPER B COMPLEX PO) Take 1 tablet by mouth daily.     Ca Phosphate-Cholecalciferol [phone] MG-UNIT TABS Take 1 tablet  by mouth daily.      cholecalciferol 2000 units TABS Take 2,000 Units by mouth daily.     desvenlafaxine (PRISTIQ) 25 MG 24 hr tablet TAKE 1 TABLET (25 MG TOTAL) BY MOUTH DAILY. 90 tablet 0   ferrous sulfate 325 (65 FE) MG tablet Take 1 tablet (325 mg total) by mouth every other day.     fluticasone (FLONASE) 50 MCG/ACT nasal spray Place 2 sprays into both nostrils daily as needed for allergies.      folic acid (FOLVITE) 1 MG tablet TAKE 1 TABLET BY MOUTH EVERY DAY 90 tablet 3   furosemide (LASIX) 40 MG tablet Take 1 tablet (40 mg total) by mouth 2 (two) times daily. 180 tablet 1   gabapentin (NEURONTIN) 300 MG capsule Take 1 capsule (300 mg total) by mouth in the morning and at bedtime.     Lactulose 20 GM/30ML SOLN Take 30 mLs (20 g total) by mouth 3 (three) times daily. 2700 mL 5   metoprolol succinate (TOPROL-XL) 25 MG 24 hr tablet Take 1 tablet (25 mg total) by mouth daily. 90 tablet 3   Misc Natural Products (TART CHERRY ADVANCED) CAPS Take 1 capsule by mouth 2 (two) times daily.     nitroGLYCERIN (NITROSTAT) 0.4 MG SL tablet Place 1 tablet (0.4 mg total) under the tongue every 5 (  five) minutes as needed for chest pain. 25 tablet 12   nystatin cream (MYCOSTATIN) APPLY TO AFFECTED AREA TWICE A DAY 30 g 1   omeprazole (PRILOSEC) 40 MG capsule TAKE 1 CAPSULE BY MOUTH EVERY DAY 90 capsule 1   ondansetron (ZOFRAN) 4 MG tablet TAKE 1 TABLET BY MOUTH EVERY 8 HOURS AS NEEDED FOR NAUSEA AND VOMITING 30 tablet 1   Red Yeast Rice 600 MG CAPS Take 2 capsules by mouth daily.     Respiratory Therapy Supplies (NEBULIZER) DEVI Use as instructed for albuterol nebulization, with necessary tubing 1 each 0   spironolactone (ALDACTONE) 100 MG tablet Take 1 tablet (100 mg total) by mouth daily. 90 tablet 1   Tiotropium Bromide Monohydrate (SPIRIVA RESPIMAT) 2.5 MCG/ACT AERS Inhale 2 puffs into the lungs daily. 4 g 11   vitamin B-12 (CYANOCOBALAMIN) 500 MCG tablet Take 1 tablet (500 mcg total) by mouth every  Monday, Wednesday, and Friday.     fentaNYL (DURAGESIC) 75 MCG/HR Place 1 patch onto the skin every 3 (three) days. 10 patch 0   losartan (COZAAR) 25 MG tablet Take 1 tablet (25 mg total) by mouth daily. 90 tablet 3   No facility-administered medications prior to visit.     Per HPI unless specifically indicated in ROS section below Review of Systems  Objective:  BP 134/62   Pulse 89   Temp 98.2 F (36.8 C) (Temporal)   Ht 5' 7.25" (1.708 m)   Wt (!) 316 lb 2 oz (143.4 kg)   SpO2 95%   BMI 49.14 kg/m   Wt Readings from Last 3 Encounters:  01/11/22 (!) 316 lb 2 oz (143.4 kg)  01/04/22 (!) 312 lb 2 oz (141.6 kg)  12/13/21 (!) 307 lb (139.3 kg)      Physical Exam Vitals and nursing note reviewed.  Constitutional:      Appearance: Normal appearance. He is obese. He is not ill-appearing.     Comments: Sitting in transport chair  Cardiovascular:     Rate and Rhythm: Normal rate and regular rhythm.     Pulses: Normal pulses.     Heart sounds: Normal heart sounds. No murmur heard. Pulmonary:     Effort: Pulmonary effort is normal. No respiratory distress.     Breath sounds: Wheezing (coarse throughout) and rhonchi present. No rales.  Abdominal:     General: Bowel sounds are normal. There is distension.     Palpations: There is no fluid wave or mass.     Tenderness: There is no abdominal tenderness. There is no guarding or rebound.  Musculoskeletal:     Right lower leg: No edema.     Left lower leg: Edema present.     Comments: No significant leg weeping  Skin:    General: Skin is warm and dry.     Findings: Wound present.          Comments: Improved ulcer x1 anterior L lower shin  Neurological:     Mental Status: He is alert.  Psychiatric:        Mood and Affect: Mood normal.        Behavior: Behavior normal.    LLE unna boot placement: Using standard procedure, calomine impregnated gauze used to dress and wrap legs followed by ACE wrap. Patient tolerated well. Red  flags to seek urgent care reviewed.     Results for orders placed or performed in visit on 01/04/22  Ammonia  Result Value Ref Range   Ammonia 44 (  H) 11 - 35 umol/L  Protime-INR  Result Value Ref Range   INR 1.2 (H) 0.8 - 1.0 ratio   Prothrombin Time 13.4 (H) 9.6 - 13.1 sec  Comprehensive metabolic panel  Result Value Ref Range   Sodium 136 135 - 145 mEq/L   Potassium 4.8 3.5 - 5.1 mEq/L   Chloride 104 96 - 112 mEq/L   CO2 28 19 - 32 mEq/L   Glucose, Bld 92 70 - 99 mg/dL   BUN 15 6 - 23 mg/dL   Creatinine, Ser 0.90 0.40 - 1.50 mg/dL   Total Bilirubin 1.3 (H) 0.2 - 1.2 mg/dL   Alkaline Phosphatase 189 (H) 39 - 117 U/L   AST 33 0 - 37 U/L   ALT 22 0 - 53 U/L   Total Protein 7.2 6.0 - 8.3 g/dL   Albumin 2.7 (L) 3.5 - 5.2 g/dL   GFR 88.79 >60.00 mL/min   Calcium 8.5 8.4 - 10.5 mg/dL  CBC with Differential/Platelet  Result Value Ref Range   WBC 4.6 4.0 - 10.5 K/uL   RBC 3.12 (L) 4.22 - 5.81 Mil/uL   Hemoglobin 10.7 (L) 13.0 - 17.0 g/dL   HCT 31.4 (L) 39.0 - 52.0 %   MCV 100.7 (H) 78.0 - 100.0 fl   MCHC 34.1 30.0 - 36.0 g/dL   RDW 17.2 (H) 11.5 - 15.5 %   Platelets 51.0 (L) 150.0 - 400.0 K/uL   Neutrophils Relative % 71.3 43.0 - 77.0 %   Lymphocytes Relative 15.2 12.0 - 46.0 %   Monocytes Relative 8.9 3.0 - 12.0 %   Eosinophils Relative 4.0 0.0 - 5.0 %   Basophils Relative 0.6 0.0 - 3.0 %   Neutro Abs 3.2 1.4 - 7.7 K/uL   Lymphs Abs 0.7 0.7 - 4.0 K/uL   Monocytes Absolute 0.4 0.1 - 1.0 K/uL   Eosinophils Absolute 0.2 0.0 - 0.7 K/uL   Basophils Absolute 0.0 0.0 - 0.1 K/uL   *Note: Due to a large number of results and/or encounters for the requested time period, some results have not been displayed. A complete set of results can be found in Results Review.   Lab Results  Component Value Date   HGBA1C 5.2 10/04/2021    Assessment & Plan:   Problem List Items Addressed This Visit     Encounter for chronic pain management (Chronic)    Fentanyl refilled.        Obesity, morbid, BMI 40.0-49.9 ([place])    Ongoing weight gain - see below.       Smoker    Continued 1 ppd smoker. Encouraged full cessation in setting of progressive dyspnea and COPD.       COPD (chronic obstructive pulmonary disease) ([place])    Notes increased dyspnea, sputum production and wheezing. This is despite albuterol and spiriva.  Previous increased lasix didn't seem to significantly help dyspnea.  Concern for COPD exacerbation contributing to symptoms. Will Rx prednisone taper along with doxycycline antibiotic course. Continue to encourage smoking cessation. Consider addition of combo ICS/LABA.       Relevant Medications   predniSONE (DELTASONE) 20 MG tablet   Chronic dyspnea    This persists. Rx doxy/prednisone course, increase lasix. No significant improvement on spiriva - consider adding ICS/LABA vs triple therapy.  If not improved with treatment, discussed possible ER evaluation for IV diuresis.       Cirrhosis of liver with ascites (HCC)    Anticipate progression, ?ascites. Never completed US ordered by GI -  will reorder and provide # to imaging center to schedule. No signs of SBP, consider abdominocentesis. Increase lasix, continue spironolactone.  Provided with low sodium diet plan, will refer to nutritionist.       Relevant Orders   US Abdomen Complete   Thrombocytopenia (HCC)   Protein-calorie malnutrition ([place])   FTT (failure to thrive) in adult   Venous stasis ulcer of left lower leg with edema of left lower leg (HCC) - Primary    Benefit to venous leg sores with unna boot use, he tolerated this well - will reapply today.  RTC 1 wk Unna boot recheck.         Meds ordered this encounter  Medications   doxycycline (VIBRA-TABS) 100 MG tablet    Sig: Take 1 tablet (100 mg total) by mouth 2 (two) times daily.    Dispense:  20 tablet    Refill:  0   predniSONE (DELTASONE) 20 MG tablet    Sig: Take two tablets daily for 3 days followed by one tablet daily for  4 days    Dispense:  10 tablet    Refill:  0   fentaNYL (DURAGESIC) 75 MCG/HR    Sig: Place 1 patch onto the skin every 3 (three) days.    Dispense:  10 patch    Refill:  0    Note new dose   Orders Placed This Encounter  Procedures   US Abdomen Complete    Standing Status:   Future    Standing Expiration Date:   01/12/2023    Order Specific Question:   Reason for Exam (SYMPTOM  OR DIAGNOSIS REQUIRED)    Answer:   cirrhosis, eval ascites    Order Specific Question:   Preferred imaging location?    Answer:   [facility]     Patient instructions: Take prednisone and doxycycline course for breathing as well as for left leg.  Unna boot placed again - return in 1 week for recheck unna boot.  Increase lasix to 73m twice daily (2 tablets twice daily) for the next week.  [phone] schedule an abdominal ultrasound.   Follow up plan: Return in about 1 week (around 01/18/2022) for follow up visit.  JRia Bush MD

## 2022-01-12 ENCOUNTER — Encounter: Payer: Self-pay | Admitting: Family Medicine

## 2022-01-12 NOTE — Assessment & Plan Note (Signed)
Benefit to venous leg sores with unna boot use, he tolerated this well - will reapply today.  RTC 1 wk Unna boot recheck.

## 2022-01-12 NOTE — Assessment & Plan Note (Signed)
Fentanyl refilled.

## 2022-01-12 NOTE — Assessment & Plan Note (Addendum)
Continued 1 ppd smoker. Encouraged full cessation in setting of progressive dyspnea and COPD.

## 2022-01-12 NOTE — Assessment & Plan Note (Addendum)
This persists. Rx doxy/prednisone course, increase lasix. No significant improvement on spiriva - consider adding ICS/LABA vs triple therapy.  If not improved with treatment, discussed possible ER evaluation for IV diuresis.

## 2022-01-12 NOTE — Assessment & Plan Note (Addendum)
Anticipate progression, ?ascites. Never completed US ordered by GI - will reorder and provide # to imaging center to schedule. No signs of SBP, consider abdominocentesis. Increase lasix, continue spironolactone.  Provided with low sodium diet plan, will refer to nutritionist.

## 2022-01-12 NOTE — Assessment & Plan Note (Signed)
Notes increased dyspnea, sputum production and wheezing. This is despite albuterol and spiriva.  Previous increased lasix didn't seem to significantly help dyspnea.  Concern for COPD exacerbation contributing to symptoms. Will Rx prednisone taper along with doxycycline antibiotic course. Continue to encourage smoking cessation. Consider addition of combo ICS/LABA.

## 2022-01-12 NOTE — Assessment & Plan Note (Signed)
Ongoing weight gain - see below.

## 2022-01-18 ENCOUNTER — Telehealth: Payer: Self-pay | Admitting: Family Medicine

## 2022-01-18 ENCOUNTER — Ambulatory Visit: Payer: Medicare HMO | Admitting: Family Medicine

## 2022-01-18 NOTE — Telephone Encounter (Signed)
Lvm for [name] (of [facility]).  Need to know if she's asking for verbal orders discharge pt from [facility].

## 2022-01-18 NOTE — Telephone Encounter (Signed)
[name] from [facility] called about patient and he has been discharged and has been sent to hospice preferred his request. Call back number [phone].

## 2022-01-19 NOTE — Progress Notes (Signed)
This encounter was created in error - please disregard.

## 2022-01-19 NOTE — Telephone Encounter (Signed)
Lvm for [name] (of [facility]).  Need to know if she's asking for verbal orders discharge pt from [facility].

## 2022-01-20 ENCOUNTER — Encounter: Payer: Medicare HMO | Admitting: Physician Assistant

## 2022-01-20 DIAGNOSIS — I2510 Atherosclerotic heart disease of native coronary artery without angina pectoris: Secondary | ICD-10-CM

## 2022-01-20 DIAGNOSIS — I714 Abdominal aortic aneurysm, without rupture, unspecified: Secondary | ICD-10-CM

## 2022-01-20 DIAGNOSIS — E785 Hyperlipidemia, unspecified: Secondary | ICD-10-CM

## 2022-01-20 DIAGNOSIS — K7460 Unspecified cirrhosis of liver: Secondary | ICD-10-CM

## 2022-01-20 DIAGNOSIS — I10 Essential (primary) hypertension: Secondary | ICD-10-CM

## 2022-01-20 DIAGNOSIS — I255 Ischemic cardiomyopathy: Secondary | ICD-10-CM

## 2022-01-20 DIAGNOSIS — Z720 Tobacco use: Secondary | ICD-10-CM

## 2022-01-20 NOTE — Telephone Encounter (Signed)
Spoke with [name] and she stated that it was just a notification that they was discharging him from [facility]. She stated that he isn't appropriate for [facility]. Stated she will be available all day today [phone]. She stated that you are very awesome. Thank you!

## 2022-01-20 NOTE — Telephone Encounter (Signed)
Noted.  Also, d/c report was scanned into pt's chart.

## 2022-01-23 ENCOUNTER — Other Ambulatory Visit: Payer: Self-pay

## 2022-01-23 NOTE — Patient Outreach (Signed)
[facility]) Care Management  01/23/2022  [name] 1956-05-16 [id number]   Telephone Screen    Outreach call to patient to introduce [facility] services and assess care needs as part of benefit of PCP office and insurance plan. Spoke with spouse who requested call be completed with her.    Main healthcare issue/concern today: Spouse shares that she feels like patient has "given up" and has "no motivation." He just wants to lay in bed and not do anything. States this has been going on for some time. PCP aware and has started patient on anti-depressant. He has follow up MD appt on tomorrow. Spouse states that patient had [facility] services but has been discharged recently. Spouse voices that Hospice RN scheduled to come visit patient tomorrow and enroll in services.    Health Maintenance/Care Gaps Addressed & Education Provided:   -Last AWV: 10/04/21-per EMR & KPN     Enzo Montgomery, RN,BSN,CCM [facility] [facility] Telephonic Care Management Coordinator Direct Phone: [phone] Toll Free: [phone] Fax: [phone]

## 2022-01-24 ENCOUNTER — Ambulatory Visit: Payer: Medicare HMO | Admitting: Family Medicine

## 2022-01-24 ENCOUNTER — Telehealth: Payer: Self-pay | Admitting: Family Medicine

## 2022-01-24 NOTE — Telephone Encounter (Addendum)
Patient missed the last 2 follow-up appointments. Please touch base with patient/wife-how did he respond to treatment started at last office visit?  Specifically treated for possible COPD flare with prednisone and doxycycline course, also treated with twice daily Lasix for shortness of breath and edema.  We had discussed possible ER evaluation if none of the above helped his symptoms as he may need paracentesis.  I also ordered abdominal ultrasound for further evaluation of abdominal swelling. If they want to go hospice route, I do not know how much he would be able to get above done. If they still want to go hospice then I agree with requests per [facility] from a [place] care.

## 2022-01-24 NOTE — Telephone Encounter (Signed)
[place] with [facility] called and said that patient will be admitted tomorrow 01/25/22 at 1pm and they want to know if Dr Darnell Level will  serve as hospice attending and if he will sign the comfort orders and also certify that patient has a terminal illness. Call back is [phone]

## 2022-01-25 NOTE — Telephone Encounter (Signed)
Lvm asking pt/pt's wife, [name] (on dpr), to call back.  Need to get answers to Dr. Synthia Innocent questions concerning pt's sxs.

## 2022-01-25 NOTE — Telephone Encounter (Signed)
Spoke with [facility] notifying her Dr. Darnell Level is agreeing to being hospice attending.  Also, [place] Mam asks if he will be comfort care attending also or should hospice doc cover that. After quickly speaking with Dr. Darnell Level, he agrees to cover the comfort care too.  I relayed this info to [facility].  She verbalizes understanding and states pt has hospice admission visit today at 1:00.

## 2022-01-26 ENCOUNTER — Telehealth: Payer: Self-pay | Admitting: Family Medicine

## 2022-01-26 NOTE — Telephone Encounter (Signed)
Katharine Look called in from [facility]	[place] that she went to visit patient and he is in a lot of pain,and asking for more pain medications. Shestates that he has been on the same pain medication for the last 15 years,so shes wondering if he needs a change or to add more to it? He also states that he needs something for sleep. She said that he seems really depressed,and not really getting up and moving around.He also so has some lewer left leg weeping that she would like to get orders to wrap 2xs a week using Keralix ,ace wrap, and covan.. She's asking for an order to be placed for temezepam 15 mg for night time,repeat 1x. She's wondering if it could be called in today. If theres time today she would like a phone call at [phone].

## 2022-01-26 NOTE — Telephone Encounter (Signed)
Lvm asking pt/pt's wife, [name] (on dpr), to call back.  Need to get answers to Dr. Synthia Innocent questions concerning pt's sxs.

## 2022-01-27 MED ORDER — TEMAZEPAM 15 MG PO CAPS: 15.0000 mg | ORAL_CAPSULE | Freq: Every evening | ORAL | 0 refills | Status: DC | PRN

## 2022-01-27 NOTE — Telephone Encounter (Signed)
Sonya nurse with [facility] notified as instructed by telephone and verbalized understanding. Davy Pique stated that she talked with the hospice doctor yesterday and was not sure what additional pain medication that she would recommend other than add on some morphine to his fentanyl. Davy Pique stated that she was with the patient earlier today and he seemed to be fine. Davy Pique stated that he was carrying on a conversation and laughing at times. Davy Pique stated that she will report back as she sees the patient.

## 2022-01-27 NOTE — Telephone Encounter (Signed)
Lvm asking pt/pt's wife, [name] (on dpr), to call back.  Need to get answers to Dr. Synthia Innocent questions concerning pt's sxs.

## 2022-01-27 NOTE — Telephone Encounter (Signed)
Katharine Look from [facility] called in to follow up on this call. She stated she haven't heard anything back and they are wanting to add a break through medication for the patient. Stated she can be reached at [phone] as soon as possible. Thank you!

## 2022-01-27 NOTE — Addendum Note (Signed)
Addended by: Ria Bush on: 01/27/2022 01:59 PM   Modules accepted: Orders

## 2022-01-27 NOTE — Telephone Encounter (Addendum)
Agree with order for leg wraps twice weekly as recommended.  Agree with temazepam 56m nightly - with sedation precautions. I've sent to pharmacy. I haven't had luck when trying to get lower 7.564mdose filled.  Don't recommend increase in pain regimen at this time - would recommend 1 change at a time. What kind of PRN pain med was she thinking of?   He's been on varying doses of fentanyl throughout the years, previously on oxycodone as well through pain clinic, we had previously successfully tapered fentanyl dose down after hip replacement due to decreased pain, as well as due to liver disease.   Depression has been difficult to treat as he hasn't adhered to treatment regimen long enough to note effect.

## 2022-01-30 NOTE — Telephone Encounter (Signed)
Lvm asking pt/pt's wife, [name] (on dpr), to call back.  Need to get answers to Dr. Synthia Innocent questions concerning pt's sxs.

## 2022-01-31 ENCOUNTER — Emergency Department

## 2022-01-31 ENCOUNTER — Observation Stay
Admission: EM | Admit: 2022-01-31 | Discharge: 2022-02-01 | Disposition: A | Discharge status: Hospice | Attending: Internal Medicine | Admitting: Internal Medicine

## 2022-01-31 ENCOUNTER — Encounter: Payer: Self-pay | Admitting: Internal Medicine

## 2022-01-31 ENCOUNTER — Other Ambulatory Visit: Payer: Self-pay

## 2022-01-31 DIAGNOSIS — N503 Cyst of epididymis: Secondary | ICD-10-CM | POA: Diagnosis not present

## 2022-01-31 DIAGNOSIS — J449 Chronic obstructive pulmonary disease, unspecified: Secondary | ICD-10-CM | POA: Diagnosis not present

## 2022-01-31 DIAGNOSIS — G894 Chronic pain syndrome: Secondary | ICD-10-CM | POA: Diagnosis present

## 2022-01-31 DIAGNOSIS — N3289 Other specified disorders of bladder: Secondary | ICD-10-CM | POA: Diagnosis not present

## 2022-01-31 DIAGNOSIS — I110 Hypertensive heart disease with heart failure: Secondary | ICD-10-CM | POA: Insufficient documentation

## 2022-01-31 DIAGNOSIS — Z515 Encounter for palliative care: Secondary | ICD-10-CM

## 2022-01-31 DIAGNOSIS — L03315 Cellulitis of perineum: Secondary | ICD-10-CM | POA: Diagnosis not present

## 2022-01-31 DIAGNOSIS — Z79899 Other long term (current) drug therapy: Secondary | ICD-10-CM | POA: Insufficient documentation

## 2022-01-31 DIAGNOSIS — N492 Inflammatory disorders of scrotum: Principal | ICD-10-CM | POA: Diagnosis present

## 2022-01-31 DIAGNOSIS — Z955 Presence of coronary angioplasty implant and graft: Secondary | ICD-10-CM | POA: Insufficient documentation

## 2022-01-31 DIAGNOSIS — I5040 Unspecified combined systolic (congestive) and diastolic (congestive) heart failure: Secondary | ICD-10-CM | POA: Diagnosis not present

## 2022-01-31 DIAGNOSIS — K766 Portal hypertension: Secondary | ICD-10-CM | POA: Diagnosis not present

## 2022-01-31 DIAGNOSIS — R188 Other ascites: Secondary | ICD-10-CM | POA: Diagnosis present

## 2022-01-31 DIAGNOSIS — I5042 Chronic combined systolic (congestive) and diastolic (congestive) heart failure: Secondary | ICD-10-CM | POA: Diagnosis present

## 2022-01-31 DIAGNOSIS — R0902 Hypoxemia: Secondary | ICD-10-CM | POA: Diagnosis not present

## 2022-01-31 DIAGNOSIS — F332 Major depressive disorder, recurrent severe without psychotic features: Secondary | ICD-10-CM | POA: Diagnosis present

## 2022-01-31 DIAGNOSIS — E119 Type 2 diabetes mellitus without complications: Secondary | ICD-10-CM | POA: Insufficient documentation

## 2022-01-31 DIAGNOSIS — K7460 Unspecified cirrhosis of liver: Secondary | ICD-10-CM | POA: Diagnosis not present

## 2022-01-31 DIAGNOSIS — R609 Edema, unspecified: Secondary | ICD-10-CM

## 2022-01-31 DIAGNOSIS — J811 Chronic pulmonary edema: Secondary | ICD-10-CM | POA: Diagnosis not present

## 2022-01-31 DIAGNOSIS — I83029 Varicose veins of left lower extremity with ulcer of unspecified site: Secondary | ICD-10-CM | POA: Diagnosis present

## 2022-01-31 DIAGNOSIS — I10 Essential (primary) hypertension: Secondary | ICD-10-CM | POA: Diagnosis present

## 2022-01-31 DIAGNOSIS — N5089 Other specified disorders of the male genital organs: Secondary | ICD-10-CM

## 2022-01-31 DIAGNOSIS — F17290 Nicotine dependence, other tobacco product, uncomplicated: Secondary | ICD-10-CM | POA: Insufficient documentation

## 2022-01-31 DIAGNOSIS — L97929 Non-pressure chronic ulcer of unspecified part of left lower leg with unspecified severity: Secondary | ICD-10-CM | POA: Diagnosis present

## 2022-01-31 DIAGNOSIS — E785 Hyperlipidemia, unspecified: Secondary | ICD-10-CM | POA: Diagnosis present

## 2022-01-31 DIAGNOSIS — R0602 Shortness of breath: Secondary | ICD-10-CM | POA: Diagnosis not present

## 2022-01-31 DIAGNOSIS — I959 Hypotension, unspecified: Secondary | ICD-10-CM | POA: Diagnosis not present

## 2022-01-31 DIAGNOSIS — I2510 Atherosclerotic heart disease of native coronary artery without angina pectoris: Secondary | ICD-10-CM | POA: Diagnosis not present

## 2022-01-31 DIAGNOSIS — L732 Hidradenitis suppurativa: Secondary | ICD-10-CM | POA: Diagnosis present

## 2022-01-31 DIAGNOSIS — R600 Localized edema: Secondary | ICD-10-CM | POA: Diagnosis present

## 2022-01-31 DIAGNOSIS — N5082 Scrotal pain: Secondary | ICD-10-CM

## 2022-01-31 DIAGNOSIS — N281 Cyst of kidney, acquired: Secondary | ICD-10-CM | POA: Diagnosis not present

## 2022-01-31 DIAGNOSIS — F17200 Nicotine dependence, unspecified, uncomplicated: Secondary | ICD-10-CM | POA: Diagnosis present

## 2022-01-31 LAB — CBC WITH DIFFERENTIAL/PLATELET
Abs Immature Granulocytes: 0.01 10*3/uL (ref 0.00–0.07)
Basophils Absolute: 0 10*3/uL (ref 0.0–0.1)
Basophils Relative: 1 %
Eosinophils Absolute: 0.1 10*3/uL (ref 0.0–0.5)
Eosinophils Relative: 3 %
HCT: 31.3 % — ABNORMAL LOW (ref 39.0–52.0)
Hemoglobin: 10.1 g/dL — ABNORMAL LOW (ref 13.0–17.0)
Immature Granulocytes: 0 %
Lymphocytes Relative: 18 %
Lymphs Abs: 0.7 10*3/uL (ref 0.7–4.0)
MCH: 32.5 pg (ref 26.0–34.0)
MCHC: 32.3 g/dL (ref 30.0–36.0)
MCV: 100.6 fL — ABNORMAL HIGH (ref 80.0–100.0)
Monocytes Absolute: 0.4 10*3/uL (ref 0.1–1.0)
Monocytes Relative: 10 %
Neutro Abs: 2.7 10*3/uL (ref 1.7–7.7)
Neutrophils Relative %: 68 %
Platelets: 31 10*3/uL — ABNORMAL LOW (ref 150–400)
RBC: 3.11 MIL/uL — ABNORMAL LOW (ref 4.22–5.81)
RDW: 17.2 % — ABNORMAL HIGH (ref 11.5–15.5)
WBC: 3.9 10*3/uL — ABNORMAL LOW (ref 4.0–10.5)
nRBC: 0 % (ref 0.0–0.2)

## 2022-01-31 LAB — URINALYSIS, ROUTINE W REFLEX MICROSCOPIC
Bilirubin Urine: NEGATIVE
Glucose, UA: NEGATIVE mg/dL
Ketones, ur: NEGATIVE mg/dL
Leukocytes,Ua: NEGATIVE
Nitrite: NEGATIVE
Protein, ur: NEGATIVE mg/dL
Specific Gravity, Urine: 1.008 (ref 1.005–1.030)
pH: 7 (ref 5.0–8.0)

## 2022-01-31 LAB — SEDIMENTATION RATE: Sed Rate: 37 mm/hr — ABNORMAL HIGH (ref 0–20)

## 2022-01-31 LAB — BRAIN NATRIURETIC PEPTIDE: B Natriuretic Peptide: 169.1 pg/mL — ABNORMAL HIGH (ref 0.0–100.0)

## 2022-01-31 LAB — BASIC METABOLIC PANEL
Anion gap: 6 (ref 5–15)
BUN: 18 mg/dL (ref 8–23)
CO2: 29 mmol/L (ref 22–32)
Calcium: 8.5 mg/dL — ABNORMAL LOW (ref 8.9–10.3)
Chloride: 100 mmol/L (ref 98–111)
Creatinine, Ser: 1.07 mg/dL (ref 0.61–1.24)
GFR, Estimated: >60 mL/min (ref >60)
Glucose, Bld: 138 mg/dL — ABNORMAL HIGH (ref 70–99)
Potassium: 4.3 mmol/L (ref 3.5–5.1)
Sodium: 135 mmol/L (ref 135–145)

## 2022-01-31 LAB — TROPONIN I (HIGH SENSITIVITY)
Troponin I (High Sensitivity): 115 ng/L (ref <18)
Troponin I (High Sensitivity): 105 ng/L (ref <18)
Troponin I (High Sensitivity): 99 ng/L — ABNORMAL HIGH (ref <18)

## 2022-01-31 MED ORDER — FUROSEMIDE 10 MG/ML IJ SOLN
20.0000 mg | Freq: Once | INTRAMUSCULAR | Status: AC
  Administered 2022-01-31: 20 mg via INTRAVENOUS
  Filled 2022-01-31: qty 4

## 2022-01-31 MED ORDER — FUROSEMIDE 40 MG PO TABS
40.0000 mg | ORAL_TABLET | Freq: Two times a day (BID) | ORAL | Status: DC
  Administered 2022-02-01: 40 mg via ORAL

## 2022-01-31 MED ORDER — VANCOMYCIN HCL IN DEXTROSE 1-5 GM/200ML-% IV SOLN
1000.0000 mg | Freq: Once | INTRAVENOUS | Status: AC
  Administered 2022-01-31: 1000 mg via INTRAVENOUS
  Filled 2022-01-31: qty 200

## 2022-01-31 MED ORDER — FENTANYL CITRATE PF 50 MCG/ML IJ SOSY
12.5000 ug | PREFILLED_SYRINGE | INTRAMUSCULAR | Status: DC | PRN
  Administered 2022-02-01 (×2): 50 ug via INTRAVENOUS
  Filled 2022-01-31 (×3): qty 1

## 2022-01-31 MED ORDER — ONDANSETRON HCL 4 MG PO TABS
4.0000 mg | ORAL_TABLET | Freq: Three times a day (TID) | ORAL | Status: DC | PRN
Start: 2022-01-31 — End: 2022-02-01

## 2022-01-31 MED ORDER — LACTULOSE 10 GM/15ML PO SOLN
20.0000 g | Freq: Three times a day (TID) | ORAL | Status: DC
  Administered 2022-01-31 – 2022-02-01 (×2): 20 g via ORAL
  Filled 2022-01-31 (×2): qty 30

## 2022-01-31 MED ORDER — MORPHINE SULFATE (PF) 4 MG/ML IV SOLN
4.0000 mg | Freq: Once | INTRAVENOUS | Status: AC
  Administered 2022-01-31: 4 mg via INTRAVENOUS
  Filled 2022-01-31: qty 1

## 2022-01-31 MED ORDER — SODIUM CHLORIDE 0.45 % IV SOLN: INTRAVENOUS | Status: DC

## 2022-01-31 MED ORDER — TIOTROPIUM BROMIDE MONOHYDRATE 18 MCG IN CAPS: 1.0000 | ORAL_CAPSULE | Freq: Every day | RESPIRATORY_TRACT | Status: DC

## 2022-01-31 MED ORDER — FOLIC ACID 1 MG PO TABS
1.0000 mg | ORAL_TABLET | Freq: Every day | ORAL | Status: DC
  Administered 2022-02-01: 1 mg via ORAL
  Filled 2022-01-31: qty 1

## 2022-01-31 MED ORDER — IOHEXOL 350 MG/ML SOLN
100.0000 mL | Freq: Once | INTRAVENOUS | Status: AC | PRN
  Administered 2022-01-31: 100 mL via INTRAVENOUS

## 2022-01-31 MED ORDER — RED YEAST RICE 600 MG PO CAPS: 2.0000 | ORAL_CAPSULE | Freq: Every day | ORAL | Status: DC

## 2022-01-31 MED ORDER — VANCOMYCIN HCL 1500 MG/300ML IV SOLN
1500.0000 mg | Freq: Once | INTRAVENOUS | Status: DC
  Filled 2022-01-31: qty 300

## 2022-01-31 MED ORDER — NICOTINE 21 MG/24HR TD PT24
21.0000 mg | MEDICATED_PATCH | Freq: Every day | TRANSDERMAL | Status: DC
  Administered 2022-02-01 (×2): 21 mg via TRANSDERMAL
  Filled 2022-01-31 (×2): qty 1

## 2022-01-31 MED ORDER — ONDANSETRON HCL 4 MG/2ML IJ SOLN
4.0000 mg | Freq: Once | INTRAMUSCULAR | Status: AC
  Administered 2022-01-31: 4 mg via INTRAVENOUS
  Filled 2022-01-31: qty 2

## 2022-01-31 MED ORDER — NITROGLYCERIN 0.4 MG SL SUBL
0.4000 mg | SUBLINGUAL_TABLET | SUBLINGUAL | Status: DC | PRN
Start: 2022-01-31 — End: 2022-02-01

## 2022-01-31 MED ORDER — ALBUTEROL SULFATE (2.5 MG/3ML) 0.083% IN NEBU: 2.5000 mg | INHALATION_SOLUTION | Freq: Four times a day (QID) | RESPIRATORY_TRACT | Status: DC | PRN

## 2022-01-31 MED ORDER — LOSARTAN POTASSIUM 50 MG PO TABS
25.0000 mg | ORAL_TABLET | Freq: Every day | ORAL | Status: DC
  Administered 2022-02-01: 25 mg via ORAL
  Filled 2022-01-31: qty 1

## 2022-01-31 MED ORDER — FLUTICASONE PROPIONATE 50 MCG/ACT NA SUSP: 2.0000 | Freq: Every day | NASAL | Status: DC | PRN

## 2022-01-31 MED ORDER — ACETAMINOPHEN 500 MG PO TABS
500.0000 mg | ORAL_TABLET | Freq: Three times a day (TID) | ORAL | Status: DC
  Administered 2022-01-31 – 2022-02-01 (×2): 500 mg via ORAL
  Filled 2022-01-31 (×2): qty 1

## 2022-01-31 MED ORDER — PANTOPRAZOLE SODIUM 40 MG PO TBEC
80.0000 mg | DELAYED_RELEASE_TABLET | Freq: Every day | ORAL | Status: DC
  Administered 2022-02-01: 80 mg via ORAL
  Filled 2022-01-31: qty 2

## 2022-01-31 MED ORDER — FENTANYL 75 MCG/HR TD PT72
1.0000 | MEDICATED_PATCH | TRANSDERMAL | Status: DC
  Administered 2022-02-01: 1 via TRANSDERMAL
  Filled 2022-01-31: qty 1

## 2022-01-31 MED ORDER — CYANOCOBALAMIN 500 MCG PO TABS
500.0000 ug | ORAL_TABLET | ORAL | Status: DC
Start: 2022-02-01 — End: 2022-02-01
  Filled 2022-01-31: qty 1

## 2022-01-31 MED ORDER — HEPARIN SODIUM (PORCINE) 5000 UNIT/ML IJ SOLN
5000.0000 [IU] | Freq: Three times a day (TID) | INTRAMUSCULAR | Status: DC
  Administered 2022-02-01 (×2): 5000 [IU] via SUBCUTANEOUS
  Filled 2022-01-31 (×2): qty 1

## 2022-01-31 MED ORDER — TEMAZEPAM 15 MG PO CAPS
15.0000 mg | ORAL_CAPSULE | Freq: Every evening | ORAL | Status: DC | PRN
Start: 2022-01-31 — End: 2022-02-01

## 2022-01-31 MED ORDER — VENLAFAXINE HCL ER 37.5 MG PO CP24
37.5000 mg | ORAL_CAPSULE | Freq: Every day | ORAL | Status: DC
  Filled 2022-01-31: qty 1

## 2022-01-31 MED ORDER — PIPERACILLIN-TAZOBACTAM 3.375 G IVPB 30 MIN
3.3750 g | Freq: Once | INTRAVENOUS | Status: AC
  Administered 2022-01-31: 3.375 g via INTRAVENOUS
  Filled 2022-01-31: qty 50

## 2022-01-31 MED ORDER — SODIUM CHLORIDE 0.9 % IV SOLN
2.0000 g | INTRAVENOUS | Status: DC
  Administered 2022-02-01: 2 g via INTRAVENOUS
  Filled 2022-01-31: qty 20

## 2022-01-31 MED ORDER — VANCOMYCIN HCL 1500 MG/300ML IV SOLN
1500.0000 mg | Freq: Once | INTRAVENOUS | Status: AC
  Administered 2022-01-31: 1500 mg via INTRAVENOUS
  Filled 2022-01-31: qty 300

## 2022-01-31 MED ORDER — METOPROLOL SUCCINATE ER 50 MG PO TB24
25.0000 mg | ORAL_TABLET | Freq: Every day | ORAL | Status: DC
  Administered 2022-02-01: 25 mg via ORAL
  Filled 2022-01-31: qty 1

## 2022-01-31 MED ORDER — GABAPENTIN 300 MG PO CAPS
300.0000 mg | ORAL_CAPSULE | ORAL | Status: DC
  Administered 2022-01-31 – 2022-02-01 (×2): 300 mg via ORAL
  Filled 2022-01-31 (×2): qty 1

## 2022-01-31 MED ORDER — ARIPIPRAZOLE 5 MG PO TABS
5.0000 mg | ORAL_TABLET | Freq: Every day | ORAL | Status: DC
  Filled 2022-01-31: qty 1

## 2022-01-31 MED ORDER — SPIRONOLACTONE 25 MG PO TABS
100.0000 mg | ORAL_TABLET | Freq: Every day | ORAL | Status: DC
  Administered 2022-02-01: 100 mg via ORAL
  Filled 2022-01-31: qty 4

## 2022-01-31 MED ORDER — ASPIRIN 81 MG PO CHEW
324.0000 mg | CHEWABLE_TABLET | Freq: Once | ORAL | Status: AC
  Administered 2022-01-31: 324 mg via ORAL
  Filled 2022-01-31: qty 4

## 2022-01-31 MED ORDER — RIFAXIMIN 200 MG PO TABS
200.0000 mg | ORAL_TABLET | Freq: Two times a day (BID) | ORAL | Status: DC
  Administered 2022-02-01: 200 mg via ORAL
  Filled 2022-01-31 (×2): qty 1

## 2022-01-31 MED ORDER — FERROUS SULFATE 325 (65 FE) MG PO TABS
325.0000 mg | ORAL_TABLET | ORAL | Status: DC
  Administered 2022-02-01: 325 mg via ORAL
  Filled 2022-01-31: qty 1

## 2022-01-31 NOTE — Assessment & Plan Note (Addendum)
Unna boot has been in use for several weeks. Dressing removed - no open ulcer seen on exam. He does have bilateral venous stasis and marked rubor and calore suggestive of cellulitis.  Plan Rocephin for moderate cellulitis  Will not replace Unna boot

## 2022-01-31 NOTE — ED Notes (Signed)
Pt has returned from CT.  

## 2022-01-31 NOTE — Assessment & Plan Note (Signed)
Recent exacerbation: treated with prednisone burst and taper and doxycycline 01/11/22. Lungs clear on exam, no cough or sputum production.  Plan Continue home regimen.

## 2022-01-31 NOTE — ED Provider Notes (Signed)
[facility] Provider Note    Event Date/Time   First MD Initiated Contact with Patient 01/31/22 1645     (approximate)   History   Groin Swelling   HPI  [name] is a 66 y.o. male who reports he started feeling weak and a little short of breath about 3 weeks ago.  He has not been running any fever.  His scrotum is swollen and is tender now.  He has swelling in his legs and his doctor gave him an Unna boot on the left leg.      Physical Exam   Triage Vital Signs: ED Triage Vitals  Enc Vitals Group     BP 01/31/22 1514 133/66     Pulse Rate 01/31/22 1514 97     Resp 01/31/22 1514 17     Temp 01/31/22 1514 98.2 F (36.8 C)     Temp Source 01/31/22 1514 Oral     SpO2 01/31/22 1514 97 %     Weight 01/31/22 1524 (!) 315 lb 4.1 oz (143 kg)     Height 01/31/22 1524 5' 9"  (1.753 m)     Head Circumference --      Peak Flow --      Pain Score 01/31/22 1600 9     Pain Loc --      Pain Edu? --      Excl. in [place]? --     Most recent vital signs: Vitals:   01/31/22 2000 01/31/22 2030  BP: 127/75 124/76  Pulse: 94 97  Resp: 14 20  Temp:    SpO2: 96% 99%     General: Awake, short of breath and unable to sit up in bed without help. CV:  Good peripheral perfusion.  Heart regular rate and rhythm no audible murmurs Resp:  Normal effort.  Lungs are clear Abd:  No distention.  Soft and nontender Groin is edematous scrotum is edematous with redness and tenderness Extremities with edema    ED Results / Procedures / Treatments   Labs (all labs ordered are listed, but only abnormal results are displayed) Labs Reviewed  BASIC METABOLIC PANEL - Abnormal; Notable for the following components:      Result Value   Glucose, Bld 138 (*)    Calcium 8.5 (*)    All other components within normal limits  CBC WITH DIFFERENTIAL/PLATELET - Abnormal; Notable for the following components:   WBC 3.9 (*)    RBC 3.11 (*)    Hemoglobin 10.1 (*)    HCT  31.3 (*)    MCV 100.6 (*)    RDW 17.2 (*)    Platelets 31 (*)    All other components within normal limits  URINALYSIS, ROUTINE W REFLEX MICROSCOPIC - Abnormal; Notable for the following components:   Color, Urine YELLOW (*)    APPearance CLEAR (*)    Hgb urine dipstick SMALL (*)    Bacteria, UA RARE (*)    All other components within normal limits  BRAIN NATRIURETIC PEPTIDE - Abnormal; Notable for the following components:   B Natriuretic Peptide 169.1 (*)    All other components within normal limits  SEDIMENTATION RATE - Abnormal; Notable for the following components:   Sed Rate 37 (*)    All other components within normal limits  TROPONIN I (HIGH SENSITIVITY) - Abnormal; Notable for the following components:   Troponin I (High Sensitivity) 115 (*)    All other components within normal limits  TROPONIN I (  HIGH SENSITIVITY) - Abnormal; Notable for the following components:   Troponin I (High Sensitivity) 105 (*)    All other components within normal limits  TROPONIN I (HIGH SENSITIVITY) - Abnormal; Notable for the following components:   Troponin I (High Sensitivity) 99 (*)    All other components within normal limits  CULTURE, BLOOD (ROUTINE X 2)  CULTURE, BLOOD (ROUTINE X 2)     EKG  EKG read and interpreted by me shows what appears to be normal sinus rhythm with first-degree AV block at a rate of 85 normal axis very poor baseline nonspecific ST-T wave changes   RADIOLOGY Chest x-ray read by radiology reviewed and interpreted by me shows of massively enlarged heart with some increased vascular markings consistent with CHF there is change since February of last year    PROCEDURES:  Critical Care performed: Critical care time 50 minutes.  This includes evaluating the patient discussing him with the urologist reevaluating the patient at least twice getting further into his history ordering the CT scan and seeing the CT scan with the cellulitis but no obvious Fournier's  gangrene reviewing his old records and speaking with the hospitalist.  Procedures   MEDICATIONS ORDERED IN ED: Medications  vancomycin (VANCOREADY) IVPB 1500 mg/300 mL (1,500 mg Intravenous New Bag/Given 01/31/22 2136)  acetaminophen (TYLENOL) tablet 500 mg (has no administration in time range)  fentaNYL (DURAGESIC) 75 MCG/HR 1 patch (has no administration in time range)  furosemide (LASIX) tablet 40 mg (has no administration in time range)  losartan (COZAAR) tablet 25 mg (has no administration in time range)  metoprolol succinate (TOPROL-XL) 24 hr tablet 25 mg (has no administration in time range)  nitroGLYCERIN (NITROSTAT) SL tablet 0.4 mg (has no administration in time range)  spironolactone (ALDACTONE) tablet 100 mg (has no administration in time range)  ARIPiprazole (ABILIFY) tablet 5 mg (has no administration in time range)  venlafaxine XR (EFFEXOR-XR) 24 hr capsule 37.5 mg (has no administration in time range)  temazepam (RESTORIL) capsule 15 mg (has no administration in time range)  Lactulose SOLN 20 g (has no administration in time range)  pantoprazole (PROTONIX) EC tablet 80 mg (has no administration in time range)  ondansetron (ZOFRAN) tablet 4 mg (has no administration in time range)  ferrous sulfate tablet 325 mg (has no administration in time range)  folic acid (FOLVITE) tablet 1 mg (has no administration in time range)  cyanocobalamin (VITAMIN B12) tablet 500 mcg (has no administration in time range)  Red Yeast Rice CAPS 1,200 mg (has no administration in time range)  gabapentin (NEURONTIN) capsule 300 mg (has no administration in time range)  albuterol (PROVENTIL) (2.5 MG/3ML) 0.083% nebulizer solution 2.5 mg (has no administration in time range)  fluticasone (FLONASE) 50 MCG/ACT nasal spray 2 spray (has no administration in time range)  Tiotropium Bromide Monohydrate AERS 2 puff (has no administration in time range)  furosemide (LASIX) injection 20 mg (20 mg Intravenous  Given 01/31/22 1836)  aspirin chewable tablet 324 mg (324 mg Oral Given 01/31/22 1836)  morphine (PF) 4 MG/ML injection 4 mg (4 mg Intravenous Given 01/31/22 1759)  ondansetron (ZOFRAN) injection 4 mg (4 mg Intravenous Given 01/31/22 1800)  piperacillin-tazobactam (ZOSYN) IVPB 3.375 g (0 g Intravenous Stopped 01/31/22 1903)  vancomycin (VANCOCIN) IVPB 1000 mg/200 mL premix (0 mg Intravenous Stopped 01/31/22 2209)  iohexol (OMNIPAQUE) 350 MG/ML injection 100 mL (100 mLs Intravenous Contrast Given 01/31/22 2156)     IMPRESSION / MDM / ASSESSMENT AND PLAN / ED  COURSE  I reviewed the triage vital signs and the nursing notes. Dr. Bernardo Heater came to see the patient briefly but the patient does not remember what Dr. Bernardo Heater said and I was seeing another patient at the time.  Differential diagnosis includes, but is not limited to, Fournier's gangrene or cellulitis or CHF or a mixture of all 3 are possible.  Infected ascites was a possibility is not anymore.  Patient's presentation is most consistent with acute presentation with potential threat to life or bodily function. The patient is on the cardiac monitor to evaluate for evidence of arrhythmia and/or significant heart rate changes.  None were seen     FINAL CLINICAL IMPRESSION(S) / ED DIAGNOSES   Final diagnoses:  Scrotal swelling  Cellulitis of perineum  Edema, unspecified type     Rx / DC Orders   ED Discharge Orders     None        Note:  This document was prepared using Dragon voice recognition software and may include unintentional dictation errors.   Nena Polio, MD 01/31/22 2239

## 2022-01-31 NOTE — Assessment & Plan Note (Signed)
BP well controlled at admission  Plan Continue home regimen

## 2022-01-31 NOTE — Assessment & Plan Note (Signed)
Patient with multiple sources of chronic pain, currently managed by Dr. Darnell Level at [facility]. He has increased pain from scrotal infection  Plan Continue home regimen

## 2022-01-31 NOTE — Assessment & Plan Note (Signed)
Patient reports recent mild chest pain.He has known CAD, s/p MI , s/p PCI/stents. Last seen by Dr. Stanford Breed Dec '22. Last myoview 2020. He has elevated troponin at 115 trending down to 105 to 99. EKG with junctional rhythm with minimal criteria for anterior MI.   Plan Tele admit  F/u EKG in AM  Non-urgent cardiology consult 02/01/22

## 2022-01-31 NOTE — ED Notes (Signed)
Md malinda informed of trop level

## 2022-01-31 NOTE — Assessment & Plan Note (Addendum)
Long standing problem. He had a bout of scotal swelling and tenderness Feb 2023 attributed to cellulitis/hidradenitis, treated at that time with Bactrim. Now presenting with increased scrotal swelling, erythema and tenderness. No open lesion visualize.  Plan Scrotal elevation - reduce pressure on the scrotum  Change abx to Rocephin for moderated cellulitis

## 2022-01-31 NOTE — Assessment & Plan Note (Signed)
Patient chief c/o scrotal swelling and tenderness. Exam with erythema, swelling and tenderness. Has prior history of scrotal cellulitis/hidradenitis. No fever, no skin breakdown or necrosis. Covered in ED with Vanc/Zosyn  Plan Rocephin 2g q 24 for cellulitis

## 2022-01-31 NOTE — Progress Notes (Signed)
Manufacturing engineer [facility])       This patient is a current hospice patient with [facility], admitted with a terminal diagnosis end stage NASH cirrhosis of liver   [facility] will continue to follow for any discharge planning needs and to coordinate continuation of hospice care.     Please call with any questions/concerns.    Thank you for the opportunity to participate in this patient's care.  Daphene Calamity, MSW [facility] Liaison  [phone]

## 2022-01-31 NOTE — Assessment & Plan Note (Signed)
Patient appears stable  Plan Continue home medications

## 2022-01-31 NOTE — Assessment & Plan Note (Signed)
Last ECHO 10/30/18: EF 35-40%, mild LVH, severe basal to mid-inferior hypokinesis, grade I DD. BNP 169. CXR w/o pulmonary edema.  Plan ECHO  Continue home meds.

## 2022-01-31 NOTE — ED Provider Triage Note (Signed)
Emergency Medicine Provider Triage Evaluation Note  [name] , a 66 y.o. male  was evaluated in triage.  Pt complains of scrotal swelling, swelling in feet and legs.  Review of Systems  Positive: Scrotal swelling Negative: Fever  Physical Exam  BP 133/66 (BP Location: Left Arm)   Pulse 97   Temp 98.2 F (36.8 C) (Oral)   Resp 17   SpO2 97%  Gen:   Awake, no distress   Resp:  Normal effort  MSK:   Moves extremities without difficulty, edema noted to the lower legs and feet Other:  Scrotum is extremely swollen  Medical Decision Making  Medically screening exam initiated at 3:19 PM.  Appropriate orders placed.  [name] was informed that the remainder of the evaluation will be completed by another provider, this initial triage assessment does not replace that evaluation, and the importance of remaining in the ED until their evaluation is complete.  Concerns for CHF due to the amount of swelling in the scrotum and legs feet, however we will still do ultrasound of scrotum   Versie Starks, PA-C 01/31/22 1520

## 2022-01-31 NOTE — Assessment & Plan Note (Signed)
Followed by Dr. Vicente Males for cirrhosis attributed to NASH vs autoimmune hepatitis. He has had hepatic encephalopathy with mild hyperammonemia. He has been on lactulose chronically. He was prescribed Xifaxan but never got approval. He has had ascites with paracentesis in the past. Now without respiratory distress attributable to ascites or tenderness to suggest SBP. No indication for repeat paracentesis at this time. He has end stage disease.  Plan Start Xifaxan  Continue home regimen otherwise.

## 2022-01-31 NOTE — H&P (Signed)
History and Physical    [name]:[id number] DOB: 01-27-1956 DOA: 01/31/2022  DOS: the patient was seen and examined on 01/31/2022  PCP: Ria Bush, MD   Patient coming from: Home  I have personally briefly reviewed patient's old medical records in [place]  [name], a 66 y/o with multiple comorbid conditions including CAD, COPD, Chronic pain, Cirrhosis with ascites and hepatic encephalopathy, HTN, venous stasis ulceration left leg in setting of chronic venous stasis. For 3 days prior to admission he has been feeling sick with increased scrotal swelling, erythema and tender. He presents to [facility] for evaluation.    ED Course: Afebrile, 124/76  97  20  BMI 45.6. Lab: glucose 138, alk phos 189  albumin 2.7 ESR 37, INR 1.2 , BNP 169. Troponins  115 to 105 to 99. WBC 3.9 (chronic) Hgb 10.1 (chronic), Plts 31 - all related to advanced cirrhosis.CXR- NAD, no pulmonaryy edema.  Review of Systems:  Review of Systems  Constitutional:  Positive for chills and malaise/fatigue. Negative for fever and weight loss.  HENT: Negative.    Eyes: Negative.   Respiratory: Negative.    Cardiovascular: Negative.   Gastrointestinal: Negative.   Genitourinary: Negative.   Musculoskeletal:  Positive for back pain, joint pain and neck pain.  Skin:        Erythema distal LE. Rubor, Calore, Tumor, Dolore scrotum.  Endo/Heme/Allergies:  Bruises/bleeds easily.  Psychiatric/Behavioral:  Positive for depression. The patient has insomnia.     Past Medical History:  Diagnosis Date   AAA (abdominal aortic aneurysm) ([place]) 09/2012--  monitored by dr Trula Slade   stable 5.6cm CTA abdomen 2016   Abnormal drug screen 07/09/2016   1/2/018 - positive oxycodone, fentanyl, inapprop positive MJ - mod risk   Allergic rhinitis    Ascites 03/2019   B12 deficiency    CAD (coronary artery disease) cardiologist-  dr Stanford Breed   x3 with stents last 2005, EF 40%, predominantly RCA by CT 2016    Cataracts, bilateral    Cervical spondylosis 05/2010   s/p surgery   Charcot Lelan Pons Tooth muscular atrophy dx  1975   neurologist--  dr love--  type 2 per pt   Chronic pain syndrome    [facility] with [facility] --> disagreement and transfered care to Dr Sanjuan Dame at [facility], requests PCP write Rx but f/u with pain clinic Q6-12 months   COPD (chronic obstructive pulmonary disease) ([place]) 10/2011   minimal by PFTs   DDD (degenerative disc disease)    Disturbances of sensation of smell and taste    improving   Dyspnea on exertion    GERD (gastroesophageal reflux disease)    Gout    Headache    Hepatitis    hepatitis B   Hidradenitis    right groin   Hidradenitis suppurativa dx 2011   goin and leg crease   followd by Lyndle Herrlich - daily bactrim, s/p intralesional steroid injection 10/2010   Hip osteoarthritis    s/p intraarticular steroid shot (12/2012) ([place])   History of hepatitis B 1983   History of MI (myocardial infarction)    2000  &  2005   History of pneumonia    History of viral meningitis 2000   HLD (hyperlipidemia)    HTN (hypertension)    Ischemic cardiomyopathy    s/p inferior MI  --  current ef per myoview 39%   Liver cirrhosis secondary to NASH ([place]) 01/2014   by CT  scan, rec virtual colonoscopy by Dr Collene Mares 06/2014   Lumbar herniated disc    Myocardial infarction [facility])    x2   Nocturia more than twice per night    Obesity    Spinal stenosis    released from [place].  established with [facility] (07/2013)   T2DM (type 2 diabetes mellitus) ([place])    ABIs WNL 2016   Vitamin D deficiency     Past Surgical History:  Procedure Laterality Date   ABDOMINAL AORTIC ENDOVASCULAR FENESTRATED STENT GRAFT N/A 11/30/2015   Procedure: ABDOMINAL AORTIC ENDOVASCULAR FENESTRATED STENT GRAFT;  Surgeon: Serafina Mitchell, MD;  Location: [place];  Service: Vascular;  Laterality: N/A;   ANTERIOR CERVICAL DECOMP/DISCECTOMY FUSION  01-07-2010     C4 -- C7   CARDIAC CATHETERIZATION  03-30-2005  dr Albertine Patricia   ef 40% w/ inferior akinesis/  LM and CFX angiographically normal/  pLAD 30%/   Widely patent stents in RCA and PDA widely patent   CARDIOVASCULAR STRESS TEST  10-23-2012  dr Stanford Breed   No ischemia/  Moderate scar in the inferior wall, otherwise normal perfusion/  LV ef 39%,  LV wall motion: inferior/ inferolateral hypokinesis   COLONOSCOPY  05/06/2007   normal, small int hemorrhoids rpt 5 yrs due to fmhx - rec against rpt colonoscopy by Dr Collene Mares   CORONARY ANGIOPLASTY  2000  dr Stanford Breed   PCI to RCA and PDA   CORONARY ANGIOPLASTY WITH STENT PLACEMENT  03-19-2005  dr Gwyndolyn Saxon downey   inferior STEMI--- DES x4 to RCA w/ balloon angioplasty and balloon angioplasty to jailed PDA ostium/  severe hypokinesis of midinferor wall, ef 50%/  dLM 20%,  mLAD 20%,  dCFX 60%   ESOPHAGOGASTRODUODENOSCOPY  01/2017   dilated benign esophageal stenosis, portal hypertensive gastropathy Henrene Pastor)   ESOPHAGOGASTRODUODENOSCOPY (EGD) WITH PROPOFOL N/A 02/20/2018   benign biopsy Vicente Males, Bailey Mech, MD)   ESOPHAGOGASTRODUODENOSCOPY (EGD) WITH PROPOFOL N/A 05/12/2019   Procedure: ESOPHAGOGASTRODUODENOSCOPY (EGD) WITH PROPOFOL;  Surgeon: [name], MD;  Location[facility] ENDOSCOPY;  Service: Gastroenterology;  Laterality: N/A;   HYDRADENITIS EXCISION Right 12/31/2014   Procedure: WIDE EXCISION HIDRADENITIS GROIN; Coralie Keens, MD   IR PARACENTESIS  03/25/2019   LUMBAR DISC SURGERY     L5-S1   LUMBAR LAMINECTOMY  05-18-2010   L2--5   laminectomy/foraminotomy for stenosis ([place])   MYELOGRAM     L5-S1 and L1-2 spondylosis   SACROILIAC JOINT INJECTION Bilateral 10/2013   [place]   TONSILLECTOMY AND ADENOIDECTOMY  1972     reports that he has been smoking cigarettes and e-cigarettes. He started smoking about 54 years ago. He has a 57.00 pack-year smoking history. He has never used smokeless tobacco. He reports current drug use. Drugs: Fentanyl and Hydrocodone. He  reports that he does not drink alcohol.  Allergies  Allergen Reactions   Statins Shortness Of Breath    Cough, trouble breathing Cough, trouble breathing   Losartan Other (See Comments)    Causes him to have pain   Wellbutrin [Bupropion] Other (See Comments)    Worsened mood - crying   Allopurinol Nausea Only   Baclofen Nausea And Vomiting   Penicillins Nausea And Vomiting    Did it involve swelling of the face/tongue/throat, SOB, or low BP? N/A Did it involve sudden or severe rash/hives, skin peeling, or any reaction on the inside of your mouth or nose? N/A Did you need to seek medical attention at a hospital or doctor's office? N/A When did it last happen? Child  If all above answers are "NO", may proceed with cephalosporin use.   Tramadol Nausea Only    Family History  Problem Relation Age of Onset   Cancer Mother        colon   Diabetes Mother    Kidney disease Mother    Aneurysm Mother        AAA   Rheum arthritis Mother    Charcot-Marie-Tooth disease Mother    Heart disease Mother        before age 85   Cancer Father        skin   Heart attack Father    Heart disease Father        before age 22   Cancer Brother        skin   Coronary artery disease Brother    Cancer Brother        small cell lung cancer   Aneurysm Brother        AAA   Rheum arthritis Sister    Rheum arthritis Brother    Prostate cancer Neg Hx    Bladder Cancer Neg Hx    Kidney cancer Neg Hx     Prior to Admission medications   Medication Sig Start Date End Date Taking? Authorizing Provider  acetaminophen (TYLENOL) 500 MG tablet Take 1 tablet (500 mg total) by mouth in the morning, at noon, and at bedtime. 03/22/21   Ria Bush, MD  albuterol (PROVENTIL) (2.5 MG/3ML) 0.083% nebulizer solution USE 1 VIAL PER NEBULIZER EVERY 6 HRS AS NEEDED FOR WHEEZING 12/03/20   Ria Bush, MD  albuterol (VENTOLIN HFA) 108 (90 Base) MCG/ACT inhaler TAKE 2 PUFFS BY MOUTH EVERY 6 HOURS AS  NEEDED FOR WHEEZE OR SHORTNESS OF BREATH 12/14/21   Ria Bush, MD  ARIPiprazole (ABILIFY) 5 MG tablet Take 1 tablet (5 mg total) by mouth daily. 10/04/21   Ria Bush, MD  B Complex-C (SUPER B COMPLEX PO) Take 1 tablet by mouth daily.    [provider]  Ca Phosphate-Cholecalciferol [phone] MG-UNIT TABS Take 1 tablet by mouth daily.  04/29/16   [provider]  cholecalciferol 2000 units TABS Take 2,000 Units by mouth daily. 04/29/16   Ria Bush, MD  desvenlafaxine (PRISTIQ) 25 MG 24 hr tablet TAKE 1 TABLET (25 MG TOTAL) BY MOUTH DAILY. 01/09/22   Ria Bush, MD  doxycycline (VIBRA-TABS) 100 MG tablet Take 1 tablet (100 mg total) by mouth 2 (two) times daily. 01/11/22   Ria Bush, MD  fentaNYL (DURAGESIC) 75 MCG/HR Place 1 patch onto the skin every 3 (three) days. 01/11/22   Ria Bush, MD  ferrous sulfate 325 (65 FE) MG tablet Take 1 tablet (325 mg total) by mouth every other day. 04/29/19   Ria Bush, MD  fluticasone [facility]) 50 MCG/ACT nasal spray Place 2 sprays into both nostrils daily as needed for allergies.  02/09/12   Ria Bush, MD  folic acid (FOLVITE) 1 MG tablet TAKE 1 TABLET BY MOUTH EVERY DAY 11/07/21   Ria Bush, MD  furosemide (LASIX) 40 MG tablet Take 1 tablet (40 mg total) by mouth 2 (two) times daily. 01/04/22   Ria Bush, MD  gabapentin (NEURONTIN) 300 MG capsule Take 1 capsule (300 mg total) by mouth in the morning and at bedtime. 06/27/21   Ria Bush, MD  Lactulose 20 GM/30ML SOLN Take 30 mLs (20 g total) by mouth 3 (three) times daily. 02/01/21   [name], MD  losartan (COZAAR) 25 MG tablet Take 1  tablet (25 mg total) by mouth daily. 05/05/21 08/03/21  Lelon Perla, MD  metoprolol succinate (TOPROL-XL) 25 MG 24 hr tablet Take 1 tablet (25 mg total) by mouth daily. 10/04/21   Ria Bush, MD  Misc Natural Products [facility] ADVANCED) CAPS Take 1 capsule by mouth 2 (two) times  daily.    [provider]  nitroGLYCERIN (NITROSTAT) 0.4 MG SL tablet Place 1 tablet (0.4 mg total) under the tongue every 5 (five) minutes as needed for chest pain. 10/10/12   Lelon Perla, MD  nystatin cream (MYCOSTATIN) APPLY TO AFFECTED AREA TWICE A DAY 07/19/21   Billey Co, MD  omeprazole (PRILOSEC) 40 MG capsule TAKE 1 CAPSULE BY MOUTH EVERY DAY 12/14/21   Ria Bush, MD  ondansetron (ZOFRAN) 4 MG tablet TAKE 1 TABLET BY MOUTH EVERY 8 HOURS AS NEEDED FOR NAUSEA AND VOMITING 01/09/22   [name], MD  predniSONE (DELTASONE) 20 MG tablet Take two tablets daily for 3 days followed by one tablet daily for 4 days 01/11/22   Ria Bush, MD  Red Yeast Rice 600 MG CAPS Take 2 capsules by mouth daily.    [provider]  Respiratory Therapy Supplies (NEBULIZER) DEVI Use as instructed for albuterol nebulization, with necessary tubing 12/04/20   Ria Bush, MD  spironolactone (ALDACTONE) 100 MG tablet Take 1 tablet (100 mg total) by mouth daily. 02/21/21   [name], MD  temazepam (RESTORIL) 15 MG capsule Take 1 capsule (15 mg total) by mouth at bedtime as needed for sleep (sedation precautions). 01/27/22   Ria Bush, MD  Tiotropium Bromide Monohydrate (SPIRIVA RESPIMAT) 2.5 MCG/ACT AERS Inhale 2 puffs into the lungs daily. 10/04/21   Ria Bush, MD  vitamin B-12 (CYANOCOBALAMIN) 500 MCG tablet Take 1 tablet (500 mcg total) by mouth every Monday, Wednesday, and Friday. 12/27/18   Ria Bush, MD    Physical Exam: Vitals:   01/31/22 1930 01/31/22 2000 01/31/22 2030 01/31/22 2302  BP: (!) 140/102 127/75 124/76 134/72  Pulse: 90 94 97 92  Resp: 15 14 20 18   Temp:      TempSrc:      SpO2: 99% 96% 99% 98%  Weight:      Height:        Physical Exam Vitals and nursing note reviewed.  Constitutional:      General: He is not in acute distress.    Appearance: He is obese. He is ill-appearing (chronically ill appearing.).  HENT:     Head:  Normocephalic and atraumatic.     Mouth/Throat:     Mouth: Mucous membranes are moist.     Comments: Missing most of his teeth. No oral lesions Eyes:     General: No scleral icterus.    Extraocular Movements: Extraocular movements intact.     Conjunctiva/sclera: Conjunctivae normal.     Pupils: Pupils are equal, round, and reactive to light.  Cardiovascular:     Rate and Rhythm: Regular rhythm. Tachycardia present.     Pulses: Normal pulses.     Heart sounds: Normal heart sounds. No murmur heard. Pulmonary:     Effort: Pulmonary effort is normal.     Breath sounds: Normal breath sounds. No wheezing or rales.  Abdominal:     Tenderness: There is no abdominal tenderness. There is no guarding.     Comments: Morbidly obese, fluid wave with shifting dullness  Genitourinary:    Penis: Normal.      Comments: Scrotum swollen, tender, no open lesions.  [place] w/o torsion Musculoskeletal:        General: Swelling present. Normal range of motion.     Cervical back: Normal range of motion. No rigidity.     Right lower leg: Edema present.     Left lower leg: Edema present.  Lymphadenopathy:     Cervical: No cervical adenopathy.  Skin:    General: Skin is warm.     Comments: Marked calore, rubor bilaterally from knees to feet. Chronic skin changes of hidradenitis abdominal wall. Scrotum with rubor, calore, dolore.   Neurological:     General: No focal deficit present.     Mental Status: He is alert and oriented to person, place, and time.     Cranial Nerves: No cranial nerve deficit.  Psychiatric:        Mood and Affect: Mood normal.        Behavior: Behavior normal.      Labs on Admission: I have personally reviewed following labs and imaging studies  CBC: Recent Labs  Lab 01/31/22 1517  WBC 3.9*  NEUTROABS 2.7  HGB 10.1*  HCT 31.3*  MCV 100.6*  PLT 31*   Basic Metabolic Panel: Recent Labs  Lab 01/31/22 1517  NA 135  K 4.3  CL 100  CO2 29  GLUCOSE 138*  BUN 18   CREATININE 1.07  CALCIUM 8.5*   GFR: Estimated Creatinine Clearance: 95.7 mL/min (by C-G formula based on SCr of 1.07 mg/dL). Liver Function Tests: No results for input(s): "AST", "ALT", "ALKPHOS", "BILITOT", "PROT", "ALBUMIN" in the last 168 hours. No results for input(s): "LIPASE", "AMYLASE" in the last 168 hours. No results for input(s): "AMMONIA" in the last 168 hours. Coagulation Profile: No results for input(s): "INR", "PROTIME" in the last 168 hours. Cardiac Enzymes: No results for input(s): "CKTOTAL", "CKMB", "CKMBINDEX", "TROPONINI" in the last 168 hours. BNP (last 3 results) No results for input(s): "PROBNP" in the last 8760 hours. HbA1C: No results for input(s): "HGBA1C" in the last 72 hours. CBG: No results for input(s): "GLUCAP" in the last 168 hours. Lipid Profile: No results for input(s): "CHOL", "HDL", "LDLCALC", "TRIG", "CHOLHDL", "LDLDIRECT" in the last 72 hours. Thyroid Function Tests: No results for input(s): "TSH", "T4TOTAL", "FREET4", "T3FREE", "THYROIDAB" in the last 72 hours. Anemia Panel: No results for input(s): "VITAMINB12", "FOLATE", "FERRITIN", "TIBC", "IRON", "RETICCTPCT" in the last 72 hours. Urine analysis:    Component Value Date/Time   COLORURINE YELLOW (A) 01/31/2022 1659   APPEARANCEUR CLEAR (A) 01/31/2022 1659   APPEARANCEUR Hazy (A) 12/18/2019 1145   LABSPEC 1.008 01/31/2022 1659   PHURINE 7.0 01/31/2022 1659   GLUCOSEU NEGATIVE 01/31/2022 1659   HGBUR SMALL (A) 01/31/2022 1659   BILIRUBINUR NEGATIVE 01/31/2022 1659   BILIRUBINUR 1+ 12/13/2021 1214   BILIRUBINUR Negative 12/18/2019 1145   KETONESUR NEGATIVE 01/31/2022 1659   PROTEINUR NEGATIVE 01/31/2022 1659   UROBILINOGEN 4.0 (A) 12/13/2021 1214   UROBILINOGEN 1.0 03/01/2008 1803   NITRITE NEGATIVE 01/31/2022 1659   LEUKOCYTESUR NEGATIVE 01/31/2022 1659    Radiological Exams on Admission: I have personally reviewed images CT ABDOMEN PELVIS W CONTRAST  Result Date:  01/31/2022 CLINICAL DATA:  Scrotal swelling x2 days. EXAM: CT ABDOMEN AND PELVIS WITH CONTRAST TECHNIQUE: Multidetector CT imaging of the abdomen and pelvis was performed using the standard protocol following bolus administration of intravenous contrast. RADIATION DOSE REDUCTION: This exam was performed according to the departmental dose-optimization program which includes automated exposure control, adjustment of the mA and/or kV according to patient size and/or use  of iterative reconstruction technique. CONTRAST:  119m OMNIPAQUE IOHEXOL 350 MG/ML SOLN COMPARISON:  July 14, 2020 FINDINGS: Lower chest: No acute abnormality. Hepatobiliary: The liver is cirrhotic in appearance. No focal liver abnormality is seen. The gallbladder is contracted without evidence of gallstones or gallbladder wall thickening. The common bile duct measures approximately 1.2 cm in diameter. Pancreas: Punctate parenchymal calcifications are seen within the head of the pancreas. There is no evidence of pancreatic ductal dilatation. A mild amount of peripancreatic inflammatory fat stranding is seen within the region of the pancreatic head. This extends to involve the mesentery within the medial aspect of the mid to lower abdomen (axial CT images 36 through 51, CT series 2). Spleen: The spleen is markedly enlarged. Adrenals/Urinary Tract: Adrenal glands are unremarkable. Kidneys are normal in size, without renal calculi or hydronephrosis. A 1.3 cm simple cyst is seen within the anterolateral aspect of the mid left kidney. The urinary bladder is moderately distended and is otherwise unremarkable. Stomach/Bowel: Stomach is within normal limits. Appendix appears normal. No evidence of bowel wall thickening, distention, or inflammatory changes. Vascular/Lymphatic: A patent aortal bi-iliac stent graft is noted. Numerous tortuous collateral vessels are seen along the splenic hilum and perigastric region. A cluster of mildly enlarged aortocaval and  periportal lymph nodes are seen. Enlarged bilateral inguinal lymph nodes are also present. Reproductive: Prostate is unremarkable. Other: Mild to moderate severity subcutaneous and para muscular inflammatory fat stranding is seen along the lateral aspect of the abdominal and pelvic wall on the right. There is a small amount of perihepatic and perisplenic fluid. A small amount of fluid is also seen within the right lower quadrant and along the anterolateral aspect of the pelvis on the right. Musculoskeletal: A 19.2 cm x 3.9 cm x 13.6 cm outpouching of fat is seen along the anterior aspect of the lower pelvic wall. Marked severity associated subcutaneous inflammatory fat stranding and cutaneous thickening is seen within this region. This extends inferiorly along the dorsal aspect of the corpus cavernosum. There is no evidence of associated fluid collection or abscess. Extensive scrotal edema is also seen. No soft tissue air is identified. There is a total right hip replacement with associated streak artifact and subsequently limited evaluation of the adjacent osseous and soft tissue structures. A compression fracture deformity of indeterminate age is seen involving the L4 vertebral body. This represents a new finding when compared to the prior study. Multilevel degenerative changes are seen throughout the lumbar spine. IMPRESSION: 1. Marked severity cellulitis within the anterior aspect of the lower pelvic wall, with extension to involve the scrotum and dorsal aspect of the corpus cavernosum. 2. Extensive scrotal edema. 3. Cirrhotic liver with evidence of portal hypertension, as described above. 4. Small amount of ascites. 5. Patent aortal bi-iliac stent graft. 6. Age-indeterminate compression fracture deformity of the L4 vertebral body. 7. Total right hip replacement. Electronically Signed   By: TVirgina NorfolkM.D.   On: 01/31/2022 22:28   [place]OT[place] W/DOPPLER  Result Date: 01/31/2022 CLINICAL DATA:  Bilateral  testicular swelling x3 days EXAM: SCROTAL ULTRASOUND DOPPLER ULTRASOUND OF THE TESTICLES TECHNIQUE: Complete ultrasound examination of the testicles, epididymis, and other scrotal structures was performed. Color and spectral Doppler ultrasound were also utilized to evaluate blood flow to the testicles. COMPARISON:  None Available. FINDINGS: Right testicle Measurements: 3.3 x 1.8 x 2.4 cm. No mass or microlithiasis visualized. Left testicle Measurements: 2.9 x 2.1 x 2.2 cm. No mass or microlithiasis visualized. Right epididymis:  8 x 5  x 4 mm epididymal head cyst. Left epididymis:  Normal in size and appearance. Hydrocele:  None visualized. Varicocele:  None visualized. Pulsed Doppler interrogation of both testes demonstrates normal low resistance arterial and venous waveforms bilaterally. Additional comments: Scrotal wall thickening/edema. No drainable fluid collection/abscess. IMPRESSION: Normal sonographic appearance of the bilateral testes. No evidence of testicular torsion. Scrotal wall thickening/edema. No drainable fluid collection/abscess. Electronically Signed   By: Julian Hy M.D.   On: 01/31/2022 18:58   DG Chest 2 View  Result Date: 01/31/2022 CLINICAL DATA:  Scrotal swelling beginning 2 days ago Shortness of breath EXAM: CHEST - 2 VIEW COMPARISON:  CT chest, abdomen, and pelvis 07/14/2020 FINDINGS: Moderate cardiomegaly and minimal pulmonary vascular congestion. Lungs are clear. ACDF changes seen in the lower cervical spine. Abdominal aortic stent endograft partially visualized. IMPRESSION: Moderate cardiomegaly with minimal pulmonary vascular congestion. No airspace opacity to indicate pulmonary edema. Electronically Signed   By: Miachel Roux M.D.   On: 01/31/2022 15:50    EKG: I have personally reviewed EKG: accelerated junctional rhythm, question of anterior acute injury.  Assessment/Plan Principal Problem:   Cellulitis, scrotum Active Problems:   Hidradenitis   Cirrhosis of liver  with ascites (HCC)   Venous stasis ulcer of left lower leg with edema of left lower leg (HCC)   Essential hypertension   CAD (coronary artery disease)   Chronic pain syndrome   MDD (major depressive disorder), recurrent severe, without psychosis (HCC)   Combined congestive systolic and diastolic heart failure (HCC)   HLD (hyperlipidemia)   Smoker   COPD (chronic obstructive pulmonary disease) (HCC)    Assessment and Plan: * Cellulitis, scrotum Patient chief c/o scrotal swelling and tenderness. Exam with erythema, swelling and tenderness. Has prior history of scrotal cellulitis/hidradenitis. No fever, no skin breakdown or necrosis. Covered in ED with Vanc/Zosyn  Plan Rocephin 2g q 24 for cellulitis  Venous stasis ulcer of left lower leg with edema of left lower leg (HCC) Unna boot has been in use for several weeks. Dressing removed - no open ulcer seen on exam. He does have bilateral venous stasis and marked rubor and calore suggestive of cellulitis.  Plan Rocephin for moderate cellulitis  Will not replace Unna boot   Cirrhosis of liver with ascites ([place]) Followed by Dr. Vicente Males for cirrhosis attributed to NASH vs autoimmune hepatitis. He has had hepatic encephalopathy with mild hyperammonemia. He has been on lactulose chronically. He was prescribed Xifaxan but never got approval. He has had ascites with paracentesis in the past. Now without respiratory distress attributable to ascites or tenderness to suggest SBP. No indication for repeat paracentesis at this time. He has end stage disease.  Plan Start Xifaxan  Continue home regimen otherwise.  Hidradenitis Long standing problem. He had a bout of scotal swelling and tenderness Feb 2023 attributed to cellulitis/hidradenitis, treated at that time with Bactrim. Now presenting with increased scrotal swelling, erythema and tenderness. No open lesion visualize.  Plan Scrotal elevation - reduce pressure on the scrotum  Change abx to Rocephin  for moderated cellulitis  Combined congestive systolic and diastolic heart failure (HCC) Last ECHO 10/30/18: EF 35-40%, mild LVH, severe basal to mid-inferior hypokinesis, grade I DD. BNP 169. CXR w/o pulmonary edema.  Plan ECHO  Continue home meds.  MDD (major depressive disorder), recurrent severe, without psychosis ([place]) Patient appears stable  Plan Continue home medications  Chronic pain syndrome Patient with multiple sources of chronic pain, currently managed by Dr[facility] Darnell [facility]. He has increased  pain from scrotal infection  Plan Continue home regimen  CAD (coronary artery disease) Patient reports recent mild chest pain.He has known CAD, s/p MI , s/p PCI/stents. Last seen by Dr. Stanford Breed Dec '22. Last myoview 2020. He has elevated troponin at 115 trending down to 105 to 99. EKG with junctional rhythm with minimal criteria for anterior MI.   Plan Tele admit  F/u EKG in AM  Non-urgent cardiology consult 02/01/22  Essential hypertension BP well controlled at admission  Plan Continue home regimen  Smoker Long h/o tobacco abuse. Failed cessation efforts.  Plan Nicotine patch  HLD (hyperlipidemia) Continue zetia and red rice yeast.  Plan Lipid panel in AM  COPD (chronic obstructive pulmonary disease) ([place]) Recent exacerbation: treated with prednisone burst and taper and doxycycline 01/11/22. Lungs clear on exam, no cough or sputum production.  Plan Continue home regimen.        DVT prophylaxis: SQ Heparin Code Status: Full Code Family Communication: left message for Luman Holway, wife.  Disposition Plan: home when stable  Consults called: cardiology - routine consult to [facility],        Urology - Dr. Arita Miss  Admission status: Inpatient, Telemetry bed   Adella Hare, MD [facility] 01/31/2022, 11:42 PM

## 2022-01-31 NOTE — Subjective & Objective (Signed)
Mr. Decaire, a 66 y/o with multiple comorbid conditions including CAD, COPD, Chronic pain, Cirrhosis with ascites and hepatic encephalopathy, HTN, venous stasis ulceration left leg in setting of chronic venous stasis. For 3 days prior to admission he has been feeling sick with increased scrotal swelling, erythema and tender. He presents to [facility] for evaluation.

## 2022-01-31 NOTE — Assessment & Plan Note (Signed)
Continue zetia and red rice yeast.  Plan Lipid panel in AM

## 2022-01-31 NOTE — Consult Note (Signed)
Urology Consult  Requesting physician: Conni Slipper, MD  Reason for consultation: Scrotal edema/cellulitis  Chief Complaint: Swelling  History of Present Illness: [name] is a 66 y.o. transported to the ED today with lower extremity, scrotal edema and shortness of breath.  He is a current hospice patient with end stage NASH cirrhosis of liver.  He states he began to have lower extremity edema 3 weeks ago which has gradually progressed and has had scrotal swelling and pain x3 days.  Denies fever or chills.  A scrotal sonogram performed this evening showed normal-appearing testes with good flow.  No intrascrotal abnormalities were identified.  He had scrotal wall thickening with edema and no drainable fluid collection.  Urinalysis was normal and no leukocytosis  He is followed by Dr. Diamantina Providence in our office for scrotal pain and lower urinary tract symptoms.  He was last seen 07/2021 with left hemiscrotal swelling felt secondary cellulitis  Past Medical History:  Diagnosis Date   AAA (abdominal aortic aneurysm) ([place]) 09/2012--  monitored by dr Trula Slade   stable 5.6cm CTA abdomen 2016   Abnormal drug screen 07/09/2016   1/2/018 - positive oxycodone, fentanyl, inapprop positive MJ - mod risk   Allergic rhinitis    Ascites 03/2019   B12 deficiency    CAD (coronary artery disease) cardiologist-  dr Stanford Breed   x3 with stents last 2005, EF 40%, predominantly RCA by CT 2016   Cataracts, bilateral    Cervical spondylosis 05/2010   s/p surgery   Charcot Lelan Pons Tooth muscular atrophy dx  1975   neurologist--  dr love--  type 2 per pt   Chronic pain syndrome    [facility] with [facility] --> disagreement and transfered care to Dr Sanjuan Dame at [facility], requests PCP write Rx but f/u with pain clinic Q6-12 months   COPD (chronic obstructive pulmonary disease) ([place]) 10/2011   minimal by PFTs   DDD (degenerative disc disease)    Disturbances of  sensation of smell and taste    improving   Dyspnea on exertion    GERD (gastroesophageal reflux disease)    Gout    Headache    Hepatitis    hepatitis B   Hidradenitis    right groin   Hidradenitis suppurativa dx 2011   goin and leg crease   followd by Lyndle Herrlich - daily bactrim, s/p intralesional steroid injection 10/2010   Hip osteoarthritis    s/p intraarticular steroid shot (12/2012) ([place])   History of hepatitis B 1983   History of MI (myocardial infarction)    2000  &  2005   History of pneumonia    History of viral meningitis 2000   HLD (hyperlipidemia)    HTN (hypertension)    Ischemic cardiomyopathy    s/p inferior MI  --  current ef per myoview 39%   Liver cirrhosis secondary to NASH ([place]) 01/2014   by CT scan, rec virtual colonoscopy by Dr Collene Mares 06/2014   Lumbar herniated disc    Myocardial infarction ([place])    x2   Nocturia more than twice per night    Obesity    Spinal stenosis    released from [place].  established with [facility] (07/2013)   T2DM (type 2 diabetes mellitus) ([place])    ABIs WNL 2016   Vitamin D deficiency     Past Surgical History:  Procedure Laterality Date   ABDOMINAL AORTIC ENDOVASCULAR FENESTRATED STENT GRAFT N/A 11/30/2015   Procedure: ABDOMINAL  AORTIC ENDOVASCULAR FENESTRATED STENT GRAFT;  Surgeon: Serafina Mitchell, MD;  Location: [place];  Service: Vascular;  Laterality: N/A;   ANTERIOR CERVICAL DECOMP/DISCECTOMY FUSION  01-07-2010    C4 -- C7   CARDIAC CATHETERIZATION  03-30-2005  dr Albertine Patricia   ef 40% w/ inferior akinesis/  LM and CFX angiographically normal/  pLAD 30%/   Widely patent stents in RCA and PDA widely patent   CARDIOVASCULAR STRESS TEST  10-23-2012  dr Stanford Breed   No ischemia/  Moderate scar in the inferior wall, otherwise normal perfusion/  LV ef 39%,  LV wall motion: inferior/ inferolateral hypokinesis   COLONOSCOPY  05/06/2007   normal, small int hemorrhoids rpt 5 yrs due to fmhx - rec against rpt colonoscopy by Dr Collene Mares    CORONARY ANGIOPLASTY  2000  dr Stanford Breed   PCI to RCA and PDA   CORONARY ANGIOPLASTY WITH STENT PLACEMENT  03-19-2005  dr Gwyndolyn Saxon downey   inferior STEMI--- DES x4 to RCA w/ balloon angioplasty and balloon angioplasty to jailed PDA ostium/  severe hypokinesis of midinferor wall, ef 50%/  dLM 20%,  mLAD 20%,  dCFX 60%   ESOPHAGOGASTRODUODENOSCOPY  01/2017   dilated benign esophageal stenosis, portal hypertensive gastropathy Henrene Pastor)   ESOPHAGOGASTRODUODENOSCOPY (EGD) WITH PROPOFOL N/A 02/20/2018   benign biopsy Vicente Males, Bailey Mech, MD)   ESOPHAGOGASTRODUODENOSCOPY (EGD) WITH PROPOFOL N/A 05/12/2019   Procedure: ESOPHAGOGASTRODUODENOSCOPY (EGD) WITH PROPOFOL;  Surgeon: [name], MD;  Location: [facility] ENDOSCOPY;  Service: Gastroenterology;  Laterality: N/A;   HYDRADENITIS EXCISION Right 12/31/2014   Procedure: WIDE EXCISION HIDRADENITIS GROIN; Coralie Keens, MD   IR PARACENTESIS  03/25/2019   LUMBAR DISC SURGERY     L5-S1   LUMBAR LAMINECTOMY  05-18-2010   L2--5   laminectomy/foraminotomy for stenosis ([place])   MYELOGRAM     L5-S1 and L1-2 spondylosis   SACROILIAC JOINT INJECTION Bilateral 10/2013   [place]   TONSILLECTOMY AND ADENOIDECTOMY  1972    Home Medications:  No outpatient medications have been marked as taking for the 01/31/22 encounter [facility] Encounter).    Allergies:  Allergies  Allergen Reactions   Statins Shortness Of Breath    Cough, trouble breathing Cough, trouble breathing   Losartan Other (See Comments)    Causes him to have pain   Wellbutrin [Bupropion] Other (See Comments)    Worsened mood - crying   Allopurinol Nausea Only   Baclofen Nausea And Vomiting   Penicillins Nausea And Vomiting    Did it involve swelling of the face/tongue/throat, SOB, or low BP? N/A Did it involve sudden or severe rash/hives, skin peeling, or any reaction on the inside of your mouth or nose? N/A Did you need to seek medical attention at a hospital or doctor's office? N/A When did  it last happen? Child     If all above answers are "NO", may proceed with cephalosporin use.   Tramadol Nausea Only    Family History  Problem Relation Age of Onset   Cancer Mother        colon   Diabetes Mother    Kidney disease Mother    Aneurysm Mother        AAA   Rheum arthritis Mother    Charcot-Marie-Tooth disease Mother    Heart disease Mother        before age 11   Cancer Father        skin   Heart attack Father    Heart disease Father  before age 72   Cancer Brother        skin   Coronary artery disease Brother    Cancer Brother        small cell lung cancer   Aneurysm Brother        AAA   Rheum arthritis Sister    Rheum arthritis Brother    Prostate cancer Neg Hx    Bladder Cancer Neg Hx    Kidney cancer Neg Hx     Social History:  reports that he has been smoking cigarettes and e-cigarettes. He started smoking about 54 years ago. He has a 57.00 pack-year smoking history. He has never used smokeless tobacco. He reports current drug use. Drugs: Fentanyl and Hydrocodone. He reports that he does not drink alcohol.  ROS: A complete review of systems was performed.  All systems are negative except for pertinent findings as noted.  Physical Exam:  Vital signs in last 24 hours: Temp:  [98.2 F (36.8 C)] 98.2 F (36.8 C) (08/29 1600) Pulse Rate:  [90-97] 97 (08/29 2030) Resp:  [14-20] 20 (08/29 2030) BP: (124-156)/(66-127) 124/76 (08/29 2030) SpO2:  [96 %-100 %] 99 % (08/29 2030) Weight:  [143 kg] 143 kg (08/29 1524) Constitutional:  Alert, No acute distress HEENT: [place] AT GI: Abdomen is soft, nontender.  Pitting edema suprapubic area and lower abdominal wall GU: Marked penile and scrotal edema with mild scrotal erythema.  No crepitus, necrosis or significant tenderness Skin: As above Psychiatric: Normal mood and affect   Laboratory Data:  Recent Labs    01/31/22 1517  WBC 3.9*  HGB 10.1*  HCT 31.3*   Recent Labs    01/31/22 1517  NA 135   K 4.3  CL 100  CO2 29  GLUCOSE 138*  BUN 18  CREATININE 1.07  CALCIUM 8.5*   No results for input(s): "LABPT", "INR" in the last 72 hours. No results for input(s): "LABURIN" in the last 72 hours. Results for orders placed or performed in visit on 12/18/19  Microscopic Examination     Status: None   Collection Time: 12/18/19 11:45 AM   Urine  Result Value Ref Range Status   WBC, UA 0-5 0 - 5 /hpf Final   RBC, Urine 0-2 0 - 2 /hpf Final   Epithelial Cells (non renal) 0-10 0 - 10 /hpf Final   Bacteria, UA None seen None seen/Few Final   *Note: Due to a large number of results and/or encounters for the requested time period, some results have not been displayed. A complete set of results can be found in Results Review.     Radiologic Imaging: CT and ultrasound images were personally reviewed and interpreted.  No CT findings indicative of Fournier's.  Findings indicative of cellulitis were seen of the anterior abdominal wall extending to the scrotum  US SCROTUM W/DOPPLER  Result Date: 01/31/2022 CLINICAL DATA:  Bilateral testicular swelling x3 days EXAM: SCROTAL ULTRASOUND DOPPLER ULTRASOUND OF THE TESTICLES TECHNIQUE: Complete ultrasound examination of the testicles, epididymis, and other scrotal structures was performed. Color and spectral Doppler ultrasound were also utilized to evaluate blood flow to the testicles. COMPARISON:  None Available. FINDINGS: Right testicle Measurements: 3.3 x 1.8 x 2.4 cm. No mass or microlithiasis visualized. Left testicle Measurements: 2.9 x 2.1 x 2.2 cm. No mass or microlithiasis visualized. Right epididymis:  8 x 5 x 4 mm epididymal head cyst. Left epididymis:  Normal in size and appearance. Hydrocele:  None visualized. Varicocele:  None visualized. Pulsed  Doppler interrogation of both testes demonstrates normal low resistance arterial and venous waveforms bilaterally. Additional comments: Scrotal wall thickening/edema. No drainable fluid  collection/abscess. IMPRESSION: Normal sonographic appearance of the bilateral testes. No evidence of testicular torsion. Scrotal wall thickening/edema. No drainable fluid collection/abscess. Electronically Signed   By: Julian Hy M.D.   On: 01/31/2022 18:58   DG Chest 2 View  Result Date: 01/31/2022 CLINICAL DATA:  Scrotal swelling beginning 2 days ago Shortness of breath EXAM: CHEST - 2 VIEW COMPARISON:  CT chest, abdomen, and pelvis 07/14/2020 FINDINGS: Moderate cardiomegaly and minimal pulmonary vascular congestion. Lungs are clear. ACDF changes seen in the lower cervical spine. Abdominal aortic stent endograft partially visualized. IMPRESSION: Moderate cardiomegaly with minimal pulmonary vascular congestion. No airspace opacity to indicate pulmonary edema. Electronically Signed   By: Miachel Roux M.D.   On: 01/31/2022 15:50    Impression/Recommendation:   1.  Scrotal/penile edema This does not appear to be a primary genitourinary problem as he has significant edema from the lower extremities extending to the lower abdominal area.  CT suggest secondary cellulitis No findings suspicious for Fournier's and no indication for surgical management The ED physician indicated he is being admitted to the hospitalist service for IV antibiotics.  Will follow    01/31/2022, 9:40 PM  John Giovanni,  MD

## 2022-01-31 NOTE — Assessment & Plan Note (Signed)
Long h/o tobacco abuse. Failed cessation efforts.  Plan Nicotine patch

## 2022-01-31 NOTE — Progress Notes (Signed)
PHARMACIST - PHYSICIAN ORDER COMMUNICATION  CONCERNING: P&T Medication Policy on Herbal Medications  DESCRIPTION:  This patient's order for:  Red Yeast Rice Caps  has been noted.  This product(s) is classified as an "herbal" or natural product. Due to a lack of definitive safety studies or FDA approval, nonstandard manufacturing practices, plus the potential risk of unknown drug-drug interactions while on inpatient medications, the Pharmacy and Therapeutics Committee does not permit the use of "herbal" or natural products of this type within [facility].   ACTION TAKEN: The pharmacy department is unable to verify this order at this time and your patient has been informed of this safety policy. Please reevaluate patient's clinical condition at discharge and address if the herbal or natural product(s) should be resumed at that time.

## 2022-01-31 NOTE — ED Triage Notes (Signed)
Pt has scrotal swelling that started 2 days ago. Pt feet have been swollen and draining for 3 weeks . Pt complains that he is shob as well.

## 2022-01-31 NOTE — Telephone Encounter (Signed)
Lvm asking pt/pt's wife, [name] (on dpr), to call back.  Need to get answers to Dr. Synthia Innocent questions concerning pt's sxs.

## 2022-02-01 ENCOUNTER — Ambulatory Visit: Payer: Medicare HMO | Admitting: Family Medicine

## 2022-02-01 ENCOUNTER — Telehealth: Payer: Self-pay | Admitting: Family Medicine

## 2022-02-01 DIAGNOSIS — I2510 Atherosclerotic heart disease of native coronary artery without angina pectoris: Secondary | ICD-10-CM

## 2022-02-01 DIAGNOSIS — N492 Inflammatory disorders of scrotum: Secondary | ICD-10-CM

## 2022-02-01 DIAGNOSIS — K7460 Unspecified cirrhosis of liver: Secondary | ICD-10-CM

## 2022-02-01 DIAGNOSIS — R188 Other ascites: Secondary | ICD-10-CM

## 2022-02-01 DIAGNOSIS — Z515 Encounter for palliative care: Secondary | ICD-10-CM

## 2022-02-01 DIAGNOSIS — N5082 Scrotal pain: Secondary | ICD-10-CM | POA: Diagnosis not present

## 2022-02-01 DIAGNOSIS — L03315 Cellulitis of perineum: Secondary | ICD-10-CM | POA: Insufficient documentation

## 2022-02-01 DIAGNOSIS — R5381 Other malaise: Secondary | ICD-10-CM | POA: Diagnosis not present

## 2022-02-01 DIAGNOSIS — R609 Edema, unspecified: Secondary | ICD-10-CM | POA: Insufficient documentation

## 2022-02-01 DIAGNOSIS — N5089 Other specified disorders of the male genital organs: Secondary | ICD-10-CM | POA: Diagnosis not present

## 2022-02-01 DIAGNOSIS — R778 Other specified abnormalities of plasma proteins: Secondary | ICD-10-CM

## 2022-02-01 DIAGNOSIS — R531 Weakness: Secondary | ICD-10-CM | POA: Diagnosis not present

## 2022-02-01 DIAGNOSIS — Z7401 Bed confinement status: Secondary | ICD-10-CM | POA: Diagnosis not present

## 2022-02-01 LAB — HEMOGLOBIN A1C
Hgb A1c MFr Bld: 5.3 % (ref 4.8–5.6)
Mean Plasma Glucose: 105.41 mg/dL

## 2022-02-01 LAB — BASIC METABOLIC PANEL
Anion gap: 5 (ref 5–15)
BUN: 15 mg/dL (ref 8–23)
CO2: 29 mmol/L (ref 22–32)
Calcium: 8.3 mg/dL — ABNORMAL LOW (ref 8.9–10.3)
Chloride: 102 mmol/L (ref 98–111)
Creatinine, Ser: 1.05 mg/dL (ref 0.61–1.24)
GFR, Estimated: >60 mL/min (ref >60)
Glucose, Bld: 92 mg/dL (ref 70–99)
Potassium: 4.4 mmol/L (ref 3.5–5.1)
Sodium: 136 mmol/L (ref 135–145)

## 2022-02-01 LAB — CBC
HCT: 30.5 % — ABNORMAL LOW (ref 39.0–52.0)
Hemoglobin: 9.7 g/dL — ABNORMAL LOW (ref 13.0–17.0)
MCH: 32.1 pg (ref 26.0–34.0)
MCHC: 31.8 g/dL (ref 30.0–36.0)
MCV: 101 fL — ABNORMAL HIGH (ref 80.0–100.0)
Platelets: 28 10*3/uL — CL (ref 150–400)
RBC: 3.02 MIL/uL — ABNORMAL LOW (ref 4.22–5.81)
RDW: 17.1 % — ABNORMAL HIGH (ref 11.5–15.5)
WBC: 3.7 10*3/uL — ABNORMAL LOW (ref 4.0–10.5)
nRBC: 0 % (ref 0.0–0.2)

## 2022-02-01 LAB — LIPID PANEL
Cholesterol: 147 mg/dL (ref 0–200)
HDL: 48 mg/dL (ref >40)
LDL Cholesterol: 91 mg/dL (ref 0–99)
Total CHOL/HDL Ratio: 3.1 RATIO
Triglycerides: 41 mg/dL (ref <150)
VLDL: 8 mg/dL (ref 0–40)

## 2022-02-01 MED ORDER — SPIRONOLACTONE 100 MG PO TABS: 100.0000 mg | ORAL_TABLET | Freq: Two times a day (BID) | ORAL | 1 refills | Status: DC

## 2022-02-01 NOTE — ED Notes (Signed)
Pt cleaned up (urine), promofit reattached, lights dimmed.

## 2022-02-01 NOTE — Telephone Encounter (Signed)
Wife called in stating that patient missed his 1 wk f/u appntmnt on yesterday,due to being taken to the hospital. His diagnosis was cellulitis . She stated that hospice would be able to tell you everything that you may need to know about that visit.

## 2022-02-01 NOTE — ED Notes (Signed)
Dr Brigitte Pulse spoke with pt at bedside. Pt desires to discharge home. Urology consultant also saw pt at bedside. Hospice and case manager to come see pt this AM and then hopefully pt will discharge to home.

## 2022-02-01 NOTE — Telephone Encounter (Signed)
Pt admitted to [facility] on 01/31/22.

## 2022-02-01 NOTE — ED Notes (Signed)
Called [facility] for transport to [place]. [place], [place]  [id number]  per Hospice and TOC  [phone]

## 2022-02-01 NOTE — Progress Notes (Signed)
Urology Consult Follow Up  Subjective: No acute events overnight.  Patient states that his lower abdominal and scrotal pain has actually improved.  VSS.  Afebrile.  Good UOP.  WBC count of 3.7.  Serum creatinine 1.05.  Blood cultures pending.    Anti-infectives: Anti-infectives (From admission, onward)    Start     Dose/Rate Route Frequency Ordered Stop   02/01/22 0000  cefTRIAXone (ROCEPHIN) 2 g in sodium chloride 0.9 % 100 mL IVPB        2 g 200 mL/hr over 30 Minutes Intravenous Every 24 hours 01/31/22 2325 02/07/22 2359   02/01/22 0000  rifaximin (XIFAXAN) tablet 200 mg        200 mg Oral 2 times daily 01/31/22 2350     01/31/22 1930  vancomycin (VANCOREADY) IVPB 1500 mg/300 mL        1,500 mg 150 mL/hr over 120 Minutes Intravenous  Once 01/31/22 1828 01/31/22 2357   01/31/22 1830  vancomycin (VANCOCIN) IVPB 1000 mg/200 mL premix        1,000 mg 200 mL/hr over 60 Minutes Intravenous  Once 01/31/22 1827 01/31/22 2209   01/31/22 1800  vancomycin (VANCOREADY) IVPB 1500 mg/300 mL  Status:  Discontinued        1,500 mg 150 mL/hr over 120 Minutes Intravenous  Once 01/31/22 1754 01/31/22 1828   01/31/22 1800  piperacillin-tazobactam (ZOSYN) IVPB 3.375 g        3.375 g 100 mL/hr over 30 Minutes Intravenous  Once 01/31/22 1754 01/31/22 1903       Current Facility-Administered Medications  Medication Dose Route Frequency Provider Last Rate Last Admin   0.45 % sodium chloride infusion   Intravenous Continuous Norins, Heinz Knuckles, MD 50 mL/hr at 02/01/22 0135 New Bag at 02/01/22 0135   acetaminophen (TYLENOL) tablet 500 mg  500 mg Oral TID Neena Rhymes, MD   500 mg at 01/31/22 2322   albuterol (PROVENTIL) (2.5 MG/3ML) 0.083% nebulizer solution 2.5 mg  2.5 mg Nebulization Q6H PRN Norins, Heinz Knuckles, MD       ARIPiprazole (ABILIFY) tablet 5 mg  5 mg Oral Daily Norins, Heinz Knuckles, MD       cefTRIAXone (ROCEPHIN) 2 g in sodium chloride 0.9 % 100 mL IVPB  2 g Intravenous Q24H Norins,  Heinz Knuckles, MD   Stopped at 02/01/22 0133   cyanocobalamin (VITAMIN B12) tablet 500 mcg  500 mcg Oral Q M,W,F Norins, Heinz Knuckles, MD       fentaNYL (DURAGESIC) 75 MCG/HR 1 patch  1 patch Transdermal Q72H Norins, Heinz Knuckles, MD   1 patch at 02/01/22 0113   fentaNYL (SUBLIMAZE) injection 12.5-50 mcg  12.5-50 mcg Intravenous Q2H PRN Neena Rhymes, MD   50 mcg at 02/01/22 0007   ferrous sulfate tablet 325 mg  325 mg Oral QODAY Norins, Heinz Knuckles, MD       fluticasone (FLONASE) 50 MCG/ACT nasal spray 2 spray  2 spray Each Nare Daily PRN Norins, Heinz Knuckles, MD       folic acid (FOLVITE) tablet 1 mg  1 mg Oral Daily Norins, Heinz Knuckles, MD       furosemide (LASIX) tablet 40 mg  40 mg Oral BID Norins, Heinz Knuckles, MD       gabapentin (NEURONTIN) capsule 300 mg  300 mg Oral BH-qamhs Norins, Heinz Knuckles, MD   300 mg at 01/31/22 2322   heparin injection 5,000 Units  5,000 Units Subcutaneous Q8H Norins, Heinz Knuckles, MD  5,000 Units at 02/01/22 0554   lactulose (CHRONULAC) 10 GM/15ML solution 20 g  20 g Oral TID Neena Rhymes, MD   20 g at 01/31/22 2323   losartan (COZAAR) tablet 25 mg  25 mg Oral Daily Norins, Heinz Knuckles, MD       metoprolol succinate (TOPROL-XL) 24 hr tablet 25 mg  25 mg Oral Daily Norins, Heinz Knuckles, MD       nicotine (NICODERM CQ - dosed in mg/24 hours) patch 21 mg  21 mg Transdermal Daily Norins, Heinz Knuckles, MD   21 mg at 02/01/22 0009   nitroGLYCERIN (NITROSTAT) SL tablet 0.4 mg  0.4 mg Sublingual Q5 min PRN Norins, Heinz Knuckles, MD       ondansetron [facility]) tablet 4 mg  4 mg Oral Q8H PRN Norins, Heinz Knuckles, MD       pantoprazole (PROTONIX) EC tablet 80 mg  80 mg Oral Daily Norins, Heinz Knuckles, MD       rifaximin [facility]ore[facility]) tablet 200 mg  200 mg Oral BID Neena Rhymes, MD   200 mg at 02/01/22 0125   spironolactone (ALDACTONE) tablet 100 mg  100 mg Oral Daily Norins, Heinz Knuckles, MD       temazepam (RESTORIL) capsule 15 mg  15 mg Oral QHS PRN Norins, Heinz Knuckles, MD       tiotropium [facility])  inhalation capsule ([facility] use ONLY) 18 mcg  1 capsule Inhalation Daily Norins, Heinz Knuckles, MD       venlafaxine XR (EFFEXOR-XR) 24 hr capsule 37.5 mg  37.5 mg Oral Q breakfast Norins, Heinz Knuckles, MD       Current Outpatient Medications  Medication Sig Dispense Refill   acetaminophen (TYLENOL) 500 MG tablet Take 1 tablet (500 mg total) by mouth in the morning, at noon, and at bedtime.     albuterol (PROVENTIL) (2.5 MG/3ML) 0.083% nebulizer solution USE 1 VIAL PER NEBULIZER EVERY 6 HRS AS NEEDED FOR WHEEZING 75 mL 6   albuterol (VENTOLIN HFA) 108 (90 Base) MCG/ACT inhaler TAKE 2 PUFFS BY MOUTH EVERY 6 HOURS AS NEEDED FOR WHEEZE OR SHORTNESS OF BREATH 6.7 each 00   ARIPiprazole (ABILIFY) 5 MG tablet Take 1 tablet (5 mg total) by mouth daily. 90 tablet 3   B Complex-C (SUPER B COMPLEX PO) Take 1 tablet by mouth daily.     Ca Phosphate-Cholecalciferol [phone] MG-UNIT TABS Take 1 tablet by mouth daily.      cholecalciferol 2000 units TABS Take 2,000 Units by mouth daily.     desvenlafaxine (PRISTIQ) 25 MG 24 hr tablet TAKE 1 TABLET (25 MG TOTAL) BY MOUTH DAILY. 90 tablet 0   doxycycline (VIBRA-TABS) 100 MG tablet Take 1 tablet (100 mg total) by mouth 2 (two) times daily. 20 tablet 0   fentaNYL (DURAGESIC) 75 MCG/HR Place 1 patch onto the skin every 3 (three) days. 10 patch 0   ferrous sulfate 325 (65 FE) MG tablet Take 1 tablet (325 mg total) by mouth every other day.     fluticasone (FLONASE) 50 MCG/ACT nasal spray Place 2 sprays into both nostrils daily as needed for allergies.      folic acid (FOLVITE) 1 MG tablet TAKE 1 TABLET BY MOUTH EVERY DAY 90 tablet 3   furosemide (LASIX) 40 MG tablet Take 1 tablet (40 mg total) by mouth 2 (two) times daily. 180 tablet 1   gabapentin (NEURONTIN) 300 MG capsule Take 1 capsule (300 mg total) by mouth in the morning and at bedtime.  Lactulose 20 GM/30ML SOLN Take 30 mLs (20 g total) by mouth 3 (three) times daily. 2700 mL 5   losartan (COZAAR) 25 MG tablet  Take 1 tablet (25 mg total) by mouth daily. 90 tablet 3   metoprolol succinate (TOPROL-XL) 25 MG 24 hr tablet Take 1 tablet (25 mg total) by mouth daily. 90 tablet 3   Misc Natural Products (TART CHERRY ADVANCED) CAPS Take 1 capsule by mouth 2 (two) times daily.     nitroGLYCERIN (NITROSTAT) 0.4 MG SL tablet Place 1 tablet (0.4 mg total) under the tongue every 5 (five) minutes as needed for chest pain. 25 tablet 12   nystatin cream (MYCOSTATIN) APPLY TO AFFECTED AREA TWICE A DAY 30 g 1   omeprazole (PRILOSEC) 40 MG capsule TAKE 1 CAPSULE BY MOUTH EVERY DAY 90 capsule 1   ondansetron (ZOFRAN) 4 MG tablet TAKE 1 TABLET BY MOUTH EVERY 8 HOURS AS NEEDED FOR NAUSEA AND VOMITING 30 tablet 1   predniSONE (DELTASONE) 20 MG tablet Take two tablets daily for 3 days followed by one tablet daily for 4 days 10 tablet 0   Red Yeast Rice 600 MG CAPS Take 2 capsules by mouth daily.     Respiratory Therapy Supplies (NEBULIZER) DEVI Use as instructed for albuterol nebulization, with necessary tubing 1 each 0   spironolactone (ALDACTONE) 100 MG tablet Take 1 tablet (100 mg total) by mouth daily. 90 tablet 1   temazepam (RESTORIL) 15 MG capsule Take 1 capsule (15 mg total) by mouth at bedtime as needed for sleep (sedation precautions). 30 capsule 0   Tiotropium Bromide Monohydrate (SPIRIVA RESPIMAT) 2.5 MCG/ACT AERS Inhale 2 puffs into the lungs daily. 4 g 11   vitamin B-12 (CYANOCOBALAMIN) 500 MCG tablet Take 1 tablet (500 mcg total) by mouth every Monday, Wednesday, and Friday.       Objective: Vital signs in last 24 hours: Temp:  [98.2 F (36.8 C)-98.7 F (37.1 C)] 98.5 F (36.9 C) (08/30 0650) Pulse Rate:  [85-97] 91 (08/30 0558) Resp:  [13-20] 16 (08/30 0558) BP: (109-156)/(54-127) 147/59 (08/30 0558) SpO2:  [92 %-100 %] 100 % (08/30 0558) Weight:  [143 kg] 143 kg (08/29 1524)  Intake/Output from previous day: 08/29 0701 - 08/30 0700 In: 300 [IV Piggyback:300] Out: 2650 [Urine:2650] Intake/Output  this shift: No intake/output data recorded.   Physical Exam Constitutional:      Appearance: He is obese.  HENT:     Head: Normocephalic and atraumatic.     Nose: Nose normal.  Eyes:     Extraocular Movements: Extraocular movements intact.  Pulmonary:     Effort: Pulmonary effort is normal.  Abdominal:     General: There is distension.     Tenderness: There is abdominal tenderness.     Comments: Scarred areas consistent with supportive hidradenitis on the lower abdomen  Genitourinary:    Comments: Pitting edema in the canal suprapubic region down into the scrotum.  Purulent tracts are noted within the scrotum.  Scrotal edema and penile edema is present with mild erythema no fluctuating mass, crepitus or no necrosis noted.  Skin:    General: Skin is warm.  Neurological:     General: No focal deficit present.     Mental Status: He is alert and oriented to person, place, and time.  Psychiatric:        Mood and Affect: Mood normal.     Lab Results:  Recent Labs    01/31/22 1517 02/01/22 0555  WBC 3.9*  3.7*  HGB 10.1* 9.7*  HCT 31.3* 30.5*  PLT 31* 28*   BMET Recent Labs    01/31/22 1517 02/01/22 0555  NA 135 136  K 4.3 4.4  CL 100 102  CO2 29 29  GLUCOSE 138* 92  BUN 18 15  CREATININE 1.07 1.05  CALCIUM 8.5* 8.3*   PT/INR No results for input(s): "LABPROT", "INR" in the last 72 hours. ABG No results for input(s): "PHART", "HCO3" in the last 72 hours.  Invalid input(s): "PCO2", "PO2"  Studies/Results: CT ABDOMEN PELVIS W CONTRAST  Result Date: 01/31/2022 CLINICAL DATA:  Scrotal swelling x2 days. EXAM: CT ABDOMEN AND PELVIS WITH CONTRAST TECHNIQUE: Multidetector CT imaging of the abdomen and pelvis was performed using the standard protocol following bolus administration of intravenous contrast. RADIATION DOSE REDUCTION: This exam was performed according to the departmental dose-optimization program which includes automated exposure control, adjustment of the  mA and/or kV according to patient size and/or use of iterative reconstruction technique. CONTRAST:  120m OMNIPAQUE IOHEXOL 350 MG/ML SOLN COMPARISON:  July 14, 2020 FINDINGS: Lower chest: No acute abnormality. Hepatobiliary: The liver is cirrhotic in appearance. No focal liver abnormality is seen. The gallbladder is contracted without evidence of gallstones or gallbladder wall thickening. The common bile duct measures approximately 1.2 cm in diameter. Pancreas: Punctate parenchymal calcifications are seen within the head of the pancreas. There is no evidence of pancreatic ductal dilatation. A mild amount of peripancreatic inflammatory fat stranding is seen within the region of the pancreatic head. This extends to involve the mesentery within the medial aspect of the mid to lower abdomen (axial CT images 36 through 51, CT series 2). Spleen: The spleen is markedly enlarged. Adrenals/Urinary Tract: Adrenal glands are unremarkable. Kidneys are normal in size, without renal calculi or hydronephrosis. A 1.3 cm simple cyst is seen within the anterolateral aspect of the mid left kidney. The urinary bladder is moderately distended and is otherwise unremarkable. Stomach/Bowel: Stomach is within normal limits. Appendix appears normal. No evidence of bowel wall thickening, distention, or inflammatory changes. Vascular/Lymphatic: A patent aortal bi-iliac stent graft is noted. Numerous tortuous collateral vessels are seen along the splenic hilum and perigastric region. A cluster of mildly enlarged aortocaval and periportal lymph nodes are seen. Enlarged bilateral inguinal lymph nodes are also present. Reproductive: Prostate is unremarkable. Other: Mild to moderate severity subcutaneous and para muscular inflammatory fat stranding is seen along the lateral aspect of the abdominal and pelvic wall on the right. There is a small amount of perihepatic and perisplenic fluid. A small amount of fluid is also seen within the right  lower quadrant and along the anterolateral aspect of the pelvis on the right. Musculoskeletal: A 19.2 cm x 3.9 cm x 13.6 cm outpouching of fat is seen along the anterior aspect of the lower pelvic wall. Marked severity associated subcutaneous inflammatory fat stranding and cutaneous thickening is seen within this region. This extends inferiorly along the dorsal aspect of the corpus cavernosum. There is no evidence of associated fluid collection or abscess. Extensive scrotal edema is also seen. No soft tissue air is identified. There is a total right hip replacement with associated streak artifact and subsequently limited evaluation of the adjacent osseous and soft tissue structures. A compression fracture deformity of indeterminate age is seen involving the L4 vertebral body. This represents a new finding when compared to the prior study. Multilevel degenerative changes are seen throughout the lumbar spine. IMPRESSION: 1. Marked severity cellulitis within the anterior aspect of the  lower pelvic wall, with extension to involve the scrotum and dorsal aspect of the corpus cavernosum. 2. Extensive scrotal edema. 3. Cirrhotic liver with evidence of portal hypertension, as described above. 4. Small amount of ascites. 5. Patent aortal bi-iliac stent graft. 6. Age-indeterminate compression fracture deformity of the L4 vertebral body. 7. Total right hip replacement. Electronically Signed   By: Virgina Norfolk M.D.   On: 01/31/2022 22:28   US SCROTUM W/DOPPLER  Result Date: 01/31/2022 CLINICAL DATA:  Bilateral testicular swelling x3 days EXAM: SCROTAL ULTRASOUND DOPPLER ULTRASOUND OF THE TESTICLES TECHNIQUE: Complete ultrasound examination of the testicles, epididymis, and other scrotal structures was performed. Color and spectral Doppler ultrasound were also utilized to evaluate blood flow to the testicles. COMPARISON:  None Available. FINDINGS: Right testicle Measurements: 3.3 x 1.8 x 2.4 cm. No mass or microlithiasis  visualized. Left testicle Measurements: 2.9 x 2.1 x 2.2 cm. No mass or microlithiasis visualized. Right epididymis:  8 x 5 x 4 mm epididymal head cyst. Left epididymis:  Normal in size and appearance. Hydrocele:  None visualized. Varicocele:  None visualized. Pulsed Doppler interrogation of both testes demonstrates normal low resistance arterial and venous waveforms bilaterally. Additional comments: Scrotal wall thickening/edema. No drainable fluid collection/abscess. IMPRESSION: Normal sonographic appearance of the bilateral testes. No evidence of testicular torsion. Scrotal wall thickening/edema. No drainable fluid collection/abscess. Electronically Signed   By: Julian Hy M.D.   On: 01/31/2022 18:58   DG Chest 2 View  Result Date: 01/31/2022 CLINICAL DATA:  Scrotal swelling beginning 2 days ago Shortness of breath EXAM: CHEST - 2 VIEW COMPARISON:  CT chest, abdomen, and pelvis 07/14/2020 FINDINGS: Moderate cardiomegaly and minimal pulmonary vascular congestion. Lungs are clear. ACDF changes seen in the lower cervical spine. Abdominal aortic stent endograft partially visualized. IMPRESSION: Moderate cardiomegaly with minimal pulmonary vascular congestion. No airspace opacity to indicate pulmonary edema. Electronically Signed   By: Miachel Roux M.D.   On: 01/31/2022 15:50     Assessment and Plan: 66 year old male who is currently hospice patient with end-stage NASH cirrhosis of liver and a history of hidradenitis who presented for lower extremity edema, scrotal edema and shortness of breath who is currently admitted for abdominal cellulitis extending into the scrotum.  Recommendations: -No suspicious findings on today exams for Fournier's and therefore no indication for surgical management at this time -Okay to discharge back to home under hospice care from urological standpoint    LOS: 1 day    [facility] 02/01/2022

## 2022-02-01 NOTE — ED Notes (Signed)
Informed Dr Brigitte Pulse re: plt 28.

## 2022-02-01 NOTE — ED Notes (Signed)
Went in to give pt IV pain med as requested to urology RN when she saw pt at bedside. Pt is now asleep. Will check again.

## 2022-02-01 NOTE — ED Notes (Signed)
0.45% NS is not available at this time. The unit secretary has been notified to order it.

## 2022-02-01 NOTE — Consult Note (Signed)
Cardiology Consultation   Patient ID: [name] MRN: [id number]; DOB: 1955/10/11  Admit date: 01/31/2022 Date of Consult: 02/01/2022  PCP:  Ria Bush, [place] [facility] Cardiologist:  Kirk Ruths, MD        Patient Profile:   [name] is a 66 y.o. male with a hx of CAD, COPD, chronic pain, cirrhosis with ascites, hepatic encephalopathy, HTN, venous stasis with ulceration to the left leg who is being seen 02/01/2022 for the evaluation of elevated troponins at the request of Dr. Linda Hedges.  History of Present Illness:   [name] is a 66 year old male with a complex past medical history of coronary artery disease, abdominal aortic aneurysm, hypertension, DM, hyperlipidemia. COPD, cirrhosis, tobacco abuse, venous stasis with ulceration, and obesity.  He has previously had a prior inferior infarct with PCI of his right coronary artery and PDA. Most recent cardiac catheterization was performed in October 26,2006. Revealed EF 40% with inferior akinesis. His stents in the RCA were widely patent. Carotid dopplers April 2016 showed no significant stenosis. CTA July 2019 showed prior endovascular repair of suprarenal abdominal aortic aneurysm without complication. Renal artery stents were widely patent. There was mention of hepatic cirrhosis and portal venous hypertension with varices around the gastroesophageal junction.Repeat echocardiogram 10/2018 showed LVEF 35-40%, moderate left ventricular enlargement. Nuclear study 12/202 revealed EF 43%, prior inferior/inferolateral infarct with insignificant peri-infarct ischemia. Abdominal ultrasound 02/2021 revealed patent endovascular aneurysm repair with no leak noted.   He presented to the [facility] emergency department with the complaint of groin swelling and overall not feeling well on 01/31/2022. He stated that is had started approximately 3 weeks prior. Associated symptoms were a swollen scrotum that was now  tender. He had also been suffering from peripheral edema where he was following with his PCP and had an una boot placed to the left leg. Denies any chest pain, shortness of breath, or palpitations.  Initial vital signs: blood pressure 133/66, pulse 97, resp rate 17, temp 98.2  Pertinent labs: glucose 138, calcium 8.5, wbc 3.9, hgb 10.1, plts 31, bnp 169.1, Hs troponins 115, 105, 99  Imaging: moderate cardiomegaly with minima pulmonary vascular congestion. No airspace opacity to indicate pulmonary edema  Medications given in the ED: Vancomycin 1gm IVPB, zosyn 3.375 gm IVPB, zofran 4 mg IVP, morphine 80m IVP, asa 324 mg, furosemide 20 mg IVP,   Past Medical History:  Diagnosis Date   AAA (abdominal aortic aneurysm) ([place] 09/2012--  monitored by dr bTrula Slade  stable 5.6cm CTA abdomen 2016   Abnormal drug screen 07/09/2016   1/2/018 - positive oxycodone, fentanyl, inapprop positive MJ - mod risk   Allergic rhinitis    Ascites 03/2019   B12 deficiency    CAD (coronary artery disease) cardiologist-  dr cStanford Breed  x3 with stents last 2005, EF 40%, predominantly RCA by CT 2016   Cataracts, bilateral    Cervical spondylosis 05/2010   s/p surgery   Charcot MLelan PonsTooth muscular atrophy dx  1975   neurologist--  dr love--  type 2 per pt   Chronic pain syndrome    established with [facility] ([place] --> disagreement and [facility] but f/u with pain clinic Q6-12 months   COPD (chronic obstructive pulmonary disease) ([place] 10/2011   minimal by PFTs   DDD (degenerative disc disease)    Disturbances of sensation of smell and taste  improving   Dyspnea on exertion    GERD (gastroesophageal reflux disease)    Gout    Headache    Hepatitis    hepatitis B   Hidradenitis    right groin   Hidradenitis suppurativa dx 2011   goin and leg crease   followd by Lyndle Herrlich - daily bactrim, s/p intralesional steroid injection  10/2010   Hip osteoarthritis    s/p intraarticular steroid shot (12/2012) ([place])   History of hepatitis B 1983   History of MI (myocardial infarction)    2000  &  2005   History of pneumonia    History of viral meningitis 2000   HLD (hyperlipidemia)    HTN (hypertension)    Ischemic cardiomyopathy    s/p inferior MI  --  current ef per myoview 39%   Liver cirrhosis secondary to NASH ([place]) 01/2014   by CT scan, rec virtual colonoscopy by Dr Collene Mares 06/2014   Lumbar herniated disc    Myocardial infarction ([place])    x2   Nocturia more than twice per night    Obesity    Spinal stenosis    released from [place].  established with [facility] (07/2013)   T2DM (type 2 diabetes mellitus) ([place])    ABIs WNL 2016   Vitamin D deficiency     Past Surgical History:  Procedure Laterality Date   ABDOMINAL AORTIC ENDOVASCULAR FENESTRATED STENT GRAFT N/A 11/30/2015   Procedure: ABDOMINAL AORTIC ENDOVASCULAR FENESTRATED STENT GRAFT;  Surgeon: Serafina Mitchell, MD;  Location: [place];  Service: Vascular;  Laterality: N/A;   ANTERIOR CERVICAL DECOMP/DISCECTOMY FUSION  01-07-2010    C4 -- C7   CARDIAC CATHETERIZATION  03-30-2005  dr Albertine Patricia   ef 40% w/ inferior akinesis/  LM and CFX angiographically normal/  pLAD 30%/   Widely patent stents in RCA and PDA widely patent   CARDIOVASCULAR STRESS TEST  10-23-2012  dr Stanford Breed   No ischemia/  Moderate scar in the inferior wall, otherwise normal perfusion/  LV ef 39%,  LV wall motion: inferior/ inferolateral hypokinesis   COLONOSCOPY  05/06/2007   normal, small int hemorrhoids rpt 5 yrs due to fmhx - rec against rpt colonoscopy by Dr Collene Mares   CORONARY ANGIOPLASTY  2000  dr Stanford Breed   PCI to RCA and PDA   CORONARY ANGIOPLASTY WITH STENT PLACEMENT  03-19-2005  dr Gwyndolyn Saxon downey   inferior STEMI--- DES x4 to RCA w/ balloon angioplasty and balloon angioplasty to jailed PDA ostium/  severe hypokinesis of midinferor wall, ef 50%/  dLM 20%,  mLAD 20%,  dCFX 60%    ESOPHAGOGASTRODUODENOSCOPY  01/2017   dilated benign esophageal stenosis, portal hypertensive gastropathy Henrene Pastor)   ESOPHAGOGASTRODUODENOSCOPY (EGD) WITH PROPOFOL N/A 02/20/2018   benign biopsy Vicente Males, Bailey Mech, MD)   ESOPHAGOGASTRODUODENOSCOPY (EGD) WITH PROPOFOL N/A 05/12/2019   Procedure: ESOPHAGOGASTRODUODENOSCOPY (EGD) WITH PROPOFOL;  Surgeon: [name], MD;  Location[facility] ENDOSCOPY;  Service: Gastroenterology;  Laterality: N/A;   HYDRADENITIS EXCISION Right 12/31/2014   Procedure: WIDE EXCISION HIDRADENITIS GROIN; Coralie Keens, MD   IR PARACENTESIS  03/25/2019   LUMBAR DISC SURGERY     L5-S1   LUMBAR LAMINECTOMY  05-18-2010   L2--5   laminectomy/foraminotomy for stenosis ([place])   MYELOGRAM     L5-S1 and L1-2 spondylosis   SACROILIAC JOINT INJECTION Bilateral 10/2013   [place]   TONSILLECTOMY AND ADENOIDECTOMY  1972     Home Medications:  Prior to Admission medications   Medication Sig Start Date End Date  Taking? Authorizing Provider  acetaminophen (TYLENOL) 500 MG tablet Take 1 tablet (500 mg total) by mouth in the morning, at noon, and at bedtime. 03/22/21   Ria Bush, MD  albuterol (PROVENTIL) (2.5 MG/3ML) 0.083% nebulizer solution USE 1 VIAL PER NEBULIZER EVERY 6 HRS AS NEEDED FOR WHEEZING 12/03/20   Ria Bush, MD  albuterol (VENTOLIN HFA) 108 (90 Base) MCG/ACT inhaler TAKE 2 PUFFS BY MOUTH EVERY 6 HOURS AS NEEDED FOR WHEEZE OR SHORTNESS OF BREATH 12/14/21   Ria Bush, MD  ARIPiprazole (ABILIFY) 5 MG tablet Take 1 tablet (5 mg total) by mouth daily. 10/04/21   Ria Bush, MD  B Complex-C (SUPER B COMPLEX PO) Take 1 tablet by mouth daily.    [provider]  Ca Phosphate-Cholecalciferol [phone] MG-UNIT TABS Take 1 tablet by mouth daily.  04/29/16   [provider]  cholecalciferol 2000 units TABS Take 2,000 Units by mouth daily. 04/29/16   Ria Bush, MD  desvenlafaxine (PRISTIQ) 25 MG 24 hr tablet TAKE 1 TABLET (25 MG TOTAL)  BY MOUTH DAILY. 01/09/22   Ria Bush, MD  doxycycline (VIBRA-TABS) 100 MG tablet Take 1 tablet (100 mg total) by mouth 2 (two) times daily. 01/11/22   Ria Bush, MD  fentaNYL (DURAGESIC) 75 MCG/HR Place 1 patch onto the skin every 3 (three) days. 01/11/22   Ria Bush, MD  ferrous sulfate 325 (65 FE) MG tablet Take 1 tablet (325 mg total) by mouth every other day. 04/29/19   Ria Bush, MD  fluticasone [facility]) 50 MCG/ACT nasal spray Place 2 sprays into both nostrils daily as needed for allergies.  02/09/12   Ria Bush, MD  folic acid (FOLVITE) 1 MG tablet TAKE 1 TABLET BY MOUTH EVERY DAY 11/07/21   Ria Bush, MD  furosemide (LASIX) 40 MG tablet Take 1 tablet (40 mg total) by mouth 2 (two) times daily. 01/04/22   Ria Bush, MD  gabapentin (NEURONTIN) 300 MG capsule Take 1 capsule (300 mg total) by mouth in the morning and at bedtime. 06/27/21   Ria Bush, MD  Lactulose 20 GM/30ML SOLN Take 30 mLs (20 g total) by mouth 3 (three) times daily. 02/01/21   [name], MD  metoprolol succinate (TOPROL-XL) 25 MG 24 hr tablet Take 1 tablet (25 mg total) by mouth daily. 10/04/21   Ria Bush, MD  Misc Natural Products [facility] ADVANCED) CAPS Take 1 capsule by mouth 2 (two) times daily.    [provider]  nitroGLYCERIN (NITROSTAT) 0.4 MG SL tablet Place 1 tablet (0.4 mg total) under the tongue every 5 (five) minutes as needed for chest pain. 10/10/12   Lelon Perla, MD  nystatin cream (MYCOSTATIN) APPLY TO AFFECTED AREA TWICE A DAY 07/19/21   Billey Co, MD  omeprazole (PRILOSEC) 40 MG capsule TAKE 1 CAPSULE BY MOUTH EVERY DAY 12/14/21   Ria Bush, MD  ondansetron (ZOFRAN) 4 MG tablet TAKE 1 TABLET BY MOUTH EVERY 8 HOURS AS NEEDED FOR NAUSEA AND VOMITING 01/09/22   [name], MD  predniSONE (DELTASONE) 20 MG tablet Take two tablets daily for 3 days followed by one tablet daily for 4 days 01/11/22   Ria Bush, MD  Red  Yeast Rice 600 MG CAPS Take 2 capsules by mouth daily.    [provider]  Respiratory Therapy Supplies (NEBULIZER) DEVI Use as instructed for albuterol nebulization, with necessary tubing 12/04/20   Ria Bush, MD  spironolactone (ALDACTONE) 100 MG tablet Take 1 tablet (100 mg total) by mouth  2 (two) times daily. 02/01/22   Max Sane, MD  temazepam (RESTORIL) 15 MG capsule Take 1 capsule (15 mg total) by mouth at bedtime as needed for sleep (sedation precautions). 01/27/22   Ria Bush, MD  Tiotropium Bromide Monohydrate (SPIRIVA RESPIMAT) 2.5 MCG/ACT AERS Inhale 2 puffs into the lungs daily. 10/04/21   Ria Bush, MD  vitamin B-12 (CYANOCOBALAMIN) 500 MCG tablet Take 1 tablet (500 mcg total) by mouth every Monday, Wednesday, and Friday. 12/27/18   Ria Bush, MD    Inpatient Medications: Scheduled Meds:  acetaminophen  500 mg Oral TID   ARIPiprazole  5 mg Oral Daily   cyanocobalamin  500 mcg Oral Q M,W,F   fentaNYL  1 patch Transdermal Q72H   ferrous sulfate  325 mg Oral QODAY   folic acid  1 mg Oral Daily   furosemide  40 mg Oral BID   gabapentin  300 mg Oral BH-qamhs   heparin  5,000 Units Subcutaneous Q8H   lactulose  20 g Oral TID   losartan  25 mg Oral Daily   metoprolol succinate  25 mg Oral Daily   nicotine  21 mg Transdermal Daily   pantoprazole  80 mg Oral Daily   rifaximin  200 mg Oral BID   spironolactone  100 mg Oral Daily   tiotropium  1 capsule Inhalation Daily   venlafaxine XR  37.5 mg Oral Q breakfast   Continuous Infusions:  sodium chloride 50 mL/hr at 02/01/22 0135   cefTRIAXone (ROCEPHIN)  IV Stopped (02/01/22 0133)   PRN Meds: albuterol, fentaNYL (SUBLIMAZE) injection, fluticasone, nitroGLYCERIN, ondansetron, temazepam  Allergies:    Allergies  Allergen Reactions   Statins Shortness Of Breath    Cough, trouble breathing Cough, trouble breathing   Losartan Other (See Comments)    Causes him to have pain   Wellbutrin  [Bupropion] Other (See Comments)    Worsened mood - crying   Allopurinol Nausea Only   Baclofen Nausea And Vomiting   Penicillins Nausea And Vomiting    Did it involve swelling of the face/tongue/throat, SOB, or low BP? N/A Did it involve sudden or severe rash/hives, skin peeling, or any reaction on the inside of your mouth or nose? N/A Did you need to seek medical attention at a hospital or doctor's office? N/A When did it last happen? Child     If all above answers are "NO", may proceed with cephalosporin use.   Tramadol Nausea Only    Social History:   Social History   Socioeconomic History   Marital status: Married    Spouse name: [name]   Number of children: 0   Years of education: College   Highest education level: Not on file  Occupational History   Occupation: Neurosurgeon implants, crown and bridge-now disability 2006    Employer: UNEMPLOYED  Tobacco Use   Smoking status: Every Day    Packs/day: 1.00    Years: 57.00    Total pack years: 57.00    Types: Cigarettes, E-cigarettes    Start date: 06/06/1967    Last attempt to quit: 07/21/2019    Years since quitting: 2.5   Smokeless tobacco: Never   Tobacco comments:    stopped smoking a pipe in 2015  DOES SMOKE E CIG  Vaping Use   Vaping Use: Former  Substance and Sexual Activity   Alcohol use: No    Alcohol/week: 0.0 standard drinks of alcohol   Drug use: Yes    Types: Fentanyl, Hydrocodone  Sexual activity: Not Currently  Other Topics Concern   Not on file  Social History Narrative   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    Caffeine: 2 cups coffee, 2 cups soda   Occupation: Neurosurgeon implants, crowne and bridge, now disability 2006   Lives with wife, 1 dog, no children   Social Determinants of Health   Financial Resource Strain: High Risk (12/27/2020)   Overall Financial Resource Strain (CARDIA)    Difficulty of Paying Living Expenses: Hard  Food Insecurity: No Food Insecurity (01/23/2022)    Hunger Vital Sign    Worried About Running Out of Food in the Last Year: Never true    Ran Out of Food in the Last Year: Never true  Transportation Needs: No Transportation Needs (01/23/2022)   PRAPARE - Hydrologist (Medical): No    Lack of Transportation (Non-Medical): No  Physical Activity: Unknown (02/20/2018)   Exercise Vital Sign    Days of Exercise per Week: Patient refused    Minutes of Exercise per Session: Patient refused  Stress: Not on file  Social Connections: Unknown (02/20/2018)   Social Connection and Isolation Panel [NHANES]    Frequency of Communication with Friends and Family: Patient refused    Frequency of Social Gatherings with Friends and Family: Patient refused    Attends Religious Services: Patient refused    Marine scientist or Organizations: Patient refused    Attends Music therapist: Patient refused    Marital Status: Patient refused  Intimate Production manager Violence: Not on file    Family History:    Family History  Problem Relation Age of Onset   Cancer Mother        colon   Diabetes Mother    Kidney disease Mother    Aneurysm Mother        AAA   Rheum arthritis Mother    Charcot-Marie-Tooth disease Mother    Heart disease Mother        before age 59   Cancer Father        skin   Heart attack Father    Heart disease Father        before age 38   Cancer Brother        skin   Coronary artery disease Brother    Cancer Brother        small cell lung cancer   Aneurysm Brother        AAA   Rheum arthritis Sister    Rheum arthritis Brother    Prostate cancer Neg Hx    Bladder Cancer Neg Hx    Kidney cancer Neg Hx      ROS:  Please see the history of present illness.  Review of Systems  Constitutional:  Positive for malaise/fatigue.  Respiratory:  Positive for cough.   Cardiovascular:  Positive for leg swelling.  Gastrointestinal:  Positive for abdominal pain.  Genitourinary:        Scrotal  swelling  Neurological:  Positive for weakness.    All other ROS reviewed and negative.     Physical Exam/Data:   Vitals:   02/01/22 0300 02/01/22 0558 02/01/22 0650 02/01/22 0730  BP: 130/64 (!) 147/59    Pulse: 90 91  87  Resp:  16    Temp:   98.5 F (36.9 C)   TempSrc:   Oral   SpO2: 92% 100%  99%  Weight:      Height:  Intake/Output Summary (Last 24 hours) at 02/01/2022 0904 Last data filed at 02/01/2022 0133 Gross per 24 hour  Intake 300 ml  Output 2650 ml  Net -2350 ml      01/31/2022    3:24 PM 01/11/2022   11:12 AM 01/04/2022   11:38 AM  Last 3 Weights  Weight (lbs) 315 lb 4.1 oz 316 lb 2 oz 312 lb 2 oz  Weight (kg) 143 kg 143.393 kg 141.579 kg     Body mass index is 46.56 kg/m.  General:  Well nourished, well developed, in no acute distress HEENT: normal Neck: difficult to assess due to body habitus Vascular: No carotid bruits; Distal pulses 2+ bilaterally Cardiac:  normal S1, S2; RRR; no murmur  Lungs:  clear to auscultation bilaterally, no wheezing, rhonchi or rales, respirations are unlabored on room air Abd: soft, nontender, no hepatomegaly, pitting edema suprapubic area and lower abdomen, marked penile and scrotal edema Ext:  2+ edema to BLE Musculoskeletal:  No deformities, BUE and BLE strength normal and equal Skin: warm and dry  Neuro:  CNs 2-12 intact, no focal abnormalities noted Psych:  Normal affect   EKG:  The EKG was personally reviewed and demonstrates:  sinus rate of 85, nonspecific ST and T wave changes Telemetry:  Telemetry was personally reviewed and demonstrates:  sinus rates in the 90's  Relevant CV Studies: Echocardiogram ordered and pending  Laboratory Data:  High Sensitivity Troponin:   Recent Labs  Lab 01/31/22 1517 01/31/22 1659 01/31/22 1933  TROPONINIHS 115* 105* 99*     Chemistry Recent Labs  Lab 01/31/22 1517 02/01/22 0555  NA 135 136  K 4.3 4.4  CL 100 102  CO2 29 29  GLUCOSE 138* 92  BUN 18 15   CREATININE 1.07 1.05  CALCIUM 8.5* 8.3*  GFRNONAA >60 >60  ANIONGAP 6 5    No results for input(s): "PROT", "ALBUMIN", "AST", "ALT", "ALKPHOS", "BILITOT" in the last 168 hours. Lipids  Recent Labs  Lab 02/01/22 0555  CHOL 147  TRIG 41  HDL 48  LDLCALC 91  CHOLHDL 3.1    Hematology Recent Labs  Lab 01/31/22 1517 02/01/22 0555  WBC 3.9* 3.7*  RBC 3.11* 3.02*  HGB 10.1* 9.7*  HCT 31.3* 30.5*  MCV 100.6* 101.0*  MCH 32.5 32.1  MCHC 32.3 31.8  RDW 17.2* 17.1*  PLT 31* 28*   Thyroid No results for input(s): "TSH", "FREET4" in the last 168 hours.  BNP Recent Labs  Lab 01/31/22 1520  BNP 169.1*    DDimer No results for input(s): "DDIMER" in the last 168 hours.   Radiology/Studies:  CT ABDOMEN PELVIS W CONTRAST  Result Date: 01/31/2022 CLINICAL DATA:  Scrotal swelling x2 days. EXAM: CT ABDOMEN AND PELVIS WITH CONTRAST TECHNIQUE: Multidetector CT imaging of the abdomen and pelvis was performed using the standard protocol following bolus administration of intravenous contrast. RADIATION DOSE REDUCTION: This exam was performed according to the departmental dose-optimization program which includes automated exposure control, adjustment of the mA and/or kV according to patient size and/or use of iterative reconstruction technique. CONTRAST:  1104m OMNIPAQUE IOHEXOL 350 MG/ML SOLN COMPARISON:  July 14, 2020 FINDINGS: Lower chest: No acute abnormality. Hepatobiliary: The liver is cirrhotic in appearance. No focal liver abnormality is seen. The gallbladder is contracted without evidence of gallstones or gallbladder wall thickening. The common bile duct measures approximately 1.2 cm in diameter. Pancreas: Punctate parenchymal calcifications are seen within the head of the pancreas. There is no evidence of pancreatic  ductal dilatation. A mild amount of peripancreatic inflammatory fat stranding is seen within the region of the pancreatic head. This extends to involve the mesentery within  the medial aspect of the mid to lower abdomen (axial CT images 36 through 51, CT series 2). Spleen: The spleen is markedly enlarged. Adrenals/Urinary Tract: Adrenal glands are unremarkable. Kidneys are normal in size, without renal calculi or hydronephrosis. A 1.3 cm simple cyst is seen within the anterolateral aspect of the mid left kidney. The urinary bladder is moderately distended and is otherwise unremarkable. Stomach/Bowel: Stomach is within normal limits. Appendix appears normal. No evidence of bowel wall thickening, distention, or inflammatory changes. Vascular/Lymphatic: A patent aortal bi-iliac stent graft is noted. Numerous tortuous collateral vessels are seen along the splenic hilum and perigastric region. A cluster of mildly enlarged aortocaval and periportal lymph nodes are seen. Enlarged bilateral inguinal lymph nodes are also present. Reproductive: Prostate is unremarkable. Other: Mild to moderate severity subcutaneous and para muscular inflammatory fat stranding is seen along the lateral aspect of the abdominal and pelvic wall on the right. There is a small amount of perihepatic and perisplenic fluid. A small amount of fluid is also seen within the right lower quadrant and along the anterolateral aspect of the pelvis on the right. Musculoskeletal: A 19.2 cm x 3.9 cm x 13.6 cm outpouching of fat is seen along the anterior aspect of the lower pelvic wall. Marked severity associated subcutaneous inflammatory fat stranding and cutaneous thickening is seen within this region. This extends inferiorly along the dorsal aspect of the corpus cavernosum. There is no evidence of associated fluid collection or abscess. Extensive scrotal edema is also seen. No soft tissue air is identified. There is a total right hip replacement with associated streak artifact and subsequently limited evaluation of the adjacent osseous and soft tissue structures. A compression fracture deformity of indeterminate age is seen  involving the L4 vertebral body. This represents a new finding when compared to the prior study. Multilevel degenerative changes are seen throughout the lumbar spine. IMPRESSION: 1. Marked severity cellulitis within the anterior aspect of the lower pelvic wall, with extension to involve the scrotum and dorsal aspect of the corpus cavernosum. 2. Extensive scrotal edema. 3. Cirrhotic liver with evidence of portal hypertension, as described above. 4. Small amount of ascites. 5. Patent aortal bi-iliac stent graft. 6. Age-indeterminate compression fracture deformity of the L4 vertebral body. 7. Total right hip replacement. Electronically Signed   By: Virgina Norfolk M.D.   On: 01/31/2022 22:28   US SCROTUM W/DOPPLER  Result Date: 01/31/2022 CLINICAL DATA:  Bilateral testicular swelling x3 days EXAM: SCROTAL ULTRASOUND DOPPLER ULTRASOUND OF THE TESTICLES TECHNIQUE: Complete ultrasound examination of the testicles, epididymis, and other scrotal structures was performed. Color and spectral Doppler ultrasound were also utilized to evaluate blood flow to the testicles. COMPARISON:  None Available. FINDINGS: Right testicle Measurements: 3.3 x 1.8 x 2.4 cm. No mass or microlithiasis visualized. Left testicle Measurements: 2.9 x 2.1 x 2.2 cm. No mass or microlithiasis visualized. Right epididymis:  8 x 5 x 4 mm epididymal head cyst. Left epididymis:  Normal in size and appearance. Hydrocele:  None visualized. Varicocele:  None visualized. Pulsed Doppler interrogation of both testes demonstrates normal low resistance arterial and venous waveforms bilaterally. Additional comments: Scrotal wall thickening/edema. No drainable fluid collection/abscess. IMPRESSION: Normal sonographic appearance of the bilateral testes. No evidence of testicular torsion. Scrotal wall thickening/edema. No drainable fluid collection/abscess. Electronically Signed   By: Henderson Newcomer.D.  On: 01/31/2022 18:58   DG Chest 2 View  Result  Date: 01/31/2022 CLINICAL DATA:  Scrotal swelling beginning 2 days ago Shortness of breath EXAM: CHEST - 2 VIEW COMPARISON:  CT chest, abdomen, and pelvis 07/14/2020 FINDINGS: Moderate cardiomegaly and minimal pulmonary vascular congestion. Lungs are clear. ACDF changes seen in the lower cervical spine. Abdominal aortic stent endograft partially visualized. IMPRESSION: Moderate cardiomegaly with minimal pulmonary vascular congestion. No airspace opacity to indicate pulmonary edema. Electronically Signed   By: Miachel Roux M.D.   On: 01/31/2022 15:50     Assessment and Plan:   Combined congestive systolic and diastolic heart failure -LVEF 35-40% -BNP 169 -no complaints of shortness of breath -on exam diminished breath sounds with abdominal swelling, scrotal edema and BLE edema -echocardiogram ordered by IM team -continue PTA medications of toprol xl, spironolactone, losartan, furosemide -would not start on SGLT2 currently with cellulitis of the scrotum -daily weight, I&O, low sodium diet  Coronary artery disease -patient currently chest pain free -Hs troponin treaded 115, 105, and 99 -last myoview was in 2020  -last LHC revealed patent stents in 2006  Cellulitis, scrotum -continued pain, swelling, and tenderness -seen by Urology -antibiotic therapy -management per IM  Cirrhosis of the liver with ascites -continue lactulose  -most recent ammonia 44 -management per IM  Essential hypertension -blood pressure 147/59 -continue losartan 25 mg daily, toprol xl 25 mg daily -vital signs per unit protocol  Hyperlipidemia -LDL 91 -statin intolerant -continue zetia and red yeast rice  Tobacco abuse - recommend cessation -continue nicotine patch   Risk Assessment/Risk Scores:                For questions or updates, please contact [place] Please consult [email or web address] for contact info under    Signed, Reganne Messerschmidt, NP  02/01/2022 9:04 AM

## 2022-02-01 NOTE — ED Notes (Signed)
Pt soiled with urine at this time. This RN and Diplomatic Services operational officer changed this patient, full linen change at this time. Pt clean and dry at this time.

## 2022-02-01 NOTE — Discharge Summary (Signed)
Physician Discharge Summary   Patient: [name] MRN: [id number] DOB: Oct 25, 1955  Admit date:     01/31/2022  Discharge date: 02/01/22  Discharge Physician: Max Sane   PCP: Ria Bush, MD   Recommendations at discharge:   Follow-up with hospice  Discharge Diagnoses: Principal Problem:   Cellulitis, scrotum Active Problems:   Hidradenitis   Cirrhosis of liver with ascites (HCC)   Venous stasis ulcer of left lower leg with edema of left lower leg (HCC)   Essential hypertension   CAD (coronary artery disease)   Chronic pain syndrome   MDD (major depressive disorder), recurrent severe, without psychosis (HCC)   Combined congestive systolic and diastolic heart failure (HCC)   HLD (hyperlipidemia)   Smoker   COPD (chronic obstructive pulmonary disease) ([place])   Hospice care patient  Hospital Course: Assessment and Plan: *Mild cellulitis, scrotum Anasarca End-stage liver disease Treated with antibiotics.  He is not interested in aggressive treatment and is already followed by hospice at home.  He prefers to be back home with hospice.  I have increased the dose of his diuretic to see if that helps with his swelling a lot of his swelling is likely due to end-stage liver disease for which he is under hospice care  Venous stasis ulcer of left lower leg with edema of left lower leg (HCC) Cirrhosis of liver with ascites (HCC) Chronic hidradenitis Long standing problem.   Combined congestive systolic and diastolic heart failure (HCC) Last ECHO 10/30/18: EF 35-40%, mild LVH, severe basal to mid-inferior hypokinesis, grade I DD. BNP 169.   MDD (major depressive disorder), recurrent severe, without psychosis ([place]) Chronic pain syndrome His pain medicine does need to be titrated as pain control seem to be his biggest issue.  I have discussed this with hospice team will adjust his pain medication to get his pain under better control  CAD (coronary artery  disease) Essential hypertension Smoker HLD (hyperlipidemia) COPD (chronic obstructive pulmonary disease) ([place])         Consultants: Urology, cardiology Disposition: Hospice care Diet recommendation:  Discharge Diet Orders (From admission, onward)     Start     Ordered   02/01/22 0000  Diet - low sodium heart healthy        02/01/22 0856           Carb modified diet DISCHARGE MEDICATION: Allergies as of 02/01/2022       Reactions   Statins Shortness Of Breath   Cough, trouble breathing Cough, trouble breathing   Losartan Other (See Comments)   Causes him to have pain   Wellbutrin [bupropion] Other (See Comments)   Worsened mood - crying   Allopurinol Nausea Only   Baclofen Nausea And Vomiting   Penicillins Nausea And Vomiting   Did it involve swelling of the face/tongue/throat, SOB, or low BP? N/A Did it involve sudden or severe rash/hives, skin peeling, or any reaction on the inside of your mouth or nose? N/A Did you need to seek medical attention at a hospital or doctor's office? N/A When did it last happen? Child     If all above answers are "NO", may proceed with cephalosporin use.   Tramadol Nausea Only        Medication List     STOP taking these medications    losartan 25 MG tablet Commonly known as: COZAAR       TAKE these medications    acetaminophen 500 MG tablet Commonly known as: TYLENOL Take 1 tablet (  500 mg total) by mouth in the morning, at noon, and at bedtime.   albuterol (2.5 MG/3ML) 0.083% nebulizer solution Commonly known as: PROVENTIL USE 1 VIAL PER NEBULIZER EVERY 6 HRS AS NEEDED FOR WHEEZING   albuterol 108 (90 Base) MCG/ACT inhaler Commonly known as: VENTOLIN HFA TAKE 2 PUFFS BY MOUTH EVERY 6 HOURS AS NEEDED FOR WHEEZE OR SHORTNESS OF BREATH   ARIPiprazole 5 MG tablet Commonly known as: ABILIFY Take 1 tablet (5 mg total) by mouth daily.   Ca Phosphate-Cholecalciferol [phone] MG-UNIT Tabs Take 1 tablet by mouth  daily.   cyanocobalamin 500 MCG tablet Commonly known as: VITAMIN B12 Take 1 tablet (500 mcg total) by mouth every Monday, Wednesday, and Friday.   desvenlafaxine 25 MG 24 hr tablet Commonly known as: PRISTIQ TAKE 1 TABLET (25 MG TOTAL) BY MOUTH DAILY.   doxycycline 100 MG tablet Commonly known as: VIBRA-TABS Take 1 tablet (100 mg total) by mouth 2 (two) times daily.   fentaNYL 75 MCG/HR Commonly known as: [place] 1 patch onto the skin every 3 (three) days.   ferrous sulfate 325 (65 FE) MG tablet Take 1 tablet (325 mg total) by mouth every other day.   fluticasone 50 MCG/ACT nasal spray Commonly known as: FLONASE Place 2 sprays into both nostrils daily as needed for allergies.   folic acid 1 MG tablet Commonly known as: FOLVITE TAKE 1 TABLET BY MOUTH EVERY DAY   furosemide 40 MG tablet Commonly known as: LASIX Take 1 tablet (40 mg total) by mouth 2 (two) times daily.   gabapentin 300 MG capsule Commonly known as: NEURONTIN Take 1 capsule (300 mg total) by mouth in the morning and at bedtime.   Lactulose 20 GM/30ML Soln Take 30 mLs (20 g total) by mouth 3 (three) times daily.   metoprolol succinate 25 MG 24 hr tablet Commonly known as: TOPROL-XL Take 1 tablet (25 mg total) by mouth daily.   Nebulizer Devi Use as instructed for albuterol nebulization, with necessary tubing   nitroGLYCERIN 0.4 MG SL tablet Commonly known as: NITROSTAT Place 1 tablet (0.4 mg total) under the tongue every 5 (five) minutes as needed for chest pain.   nystatin cream Commonly known as: MYCOSTATIN APPLY TO AFFECTED AREA TWICE A DAY   omeprazole 40 MG capsule Commonly known as: PRILOSEC TAKE 1 CAPSULE BY MOUTH EVERY DAY   ondansetron 4 MG tablet Commonly known as: ZOFRAN TAKE 1 TABLET BY MOUTH EVERY 8 HOURS AS NEEDED FOR NAUSEA AND VOMITING   predniSONE 20 MG tablet Commonly known as: DELTASONE Take two tablets daily for 3 days followed by one tablet daily for 4 days    Red Yeast Rice 600 MG Caps Take 2 capsules by mouth daily.   Spiriva Respimat 2.5 MCG/ACT Aers Generic drug: Tiotropium Bromide Monohydrate Inhale 2 puffs into the lungs daily.   spironolactone 100 MG tablet Commonly known as: ALDACTONE Take 1 tablet (100 mg total) by mouth 2 (two) times daily. What changed: when to take this   SUPER B COMPLEX PO Take 1 tablet by mouth daily.   Tart Cherry Advanced Caps Take 1 capsule by mouth 2 (two) times daily.   temazepam 15 MG capsule Commonly known as: RESTORIL Take 1 capsule (15 mg total) by mouth at bedtime as needed for sleep (sedation precautions).   Vitamin D3 50 MCG (2000 UT) Tabs Take 2,000 Units by mouth daily.        Discharge Exam: Filed Weights   01/31/22 1524  Weight: (!) 67 kg   66 year old male lying in the bed in some pain and discomfort Lungs clear to auscultation bilaterally Cardiovascular regular rate and rhythm Abdomen soft, distended signs of hidradenitis on the lower abdomen GU: Penile and scrotal edema present with very mild erythema Extremities: Changes consistent with chronic hidradenitis   Condition at discharge: poor  The results of significant diagnostics from this hospitalization (including imaging, microbiology, ancillary and laboratory) are listed below for reference.   Imaging Studies: CT ABDOMEN PELVIS W CONTRAST  Result Date: 01/31/2022 CLINICAL DATA:  Scrotal swelling x2 days. EXAM: CT ABDOMEN AND PELVIS WITH CONTRAST TECHNIQUE: Multidetector CT imaging of the abdomen and pelvis was performed using the standard protocol following bolus administration of intravenous contrast. RADIATION DOSE REDUCTION: This exam was performed according to the departmental dose-optimization program which includes automated exposure control, adjustment of the mA and/or kV according to patient size and/or use of iterative reconstruction technique. CONTRAST:  156m OMNIPAQUE IOHEXOL 350 MG/ML SOLN COMPARISON:   July 14, 2020 FINDINGS: Lower chest: No acute abnormality. Hepatobiliary: The liver is cirrhotic in appearance. No focal liver abnormality is seen. The gallbladder is contracted without evidence of gallstones or gallbladder wall thickening. The common bile duct measures approximately 1.2 cm in diameter. Pancreas: Punctate parenchymal calcifications are seen within the head of the pancreas. There is no evidence of pancreatic ductal dilatation. A mild amount of peripancreatic inflammatory fat stranding is seen within the region of the pancreatic head. This extends to involve the mesentery within the medial aspect of the mid to lower abdomen (axial CT images 36 through 51, CT series 2). Spleen: The spleen is markedly enlarged. Adrenals/Urinary Tract: Adrenal glands are unremarkable. Kidneys are normal in size, without renal calculi or hydronephrosis. A 1.3 cm simple cyst is seen within the anterolateral aspect of the mid left kidney. The urinary bladder is moderately distended and is otherwise unremarkable. Stomach/Bowel: Stomach is within normal limits. Appendix appears normal. No evidence of bowel wall thickening, distention, or inflammatory changes. Vascular/Lymphatic: A patent aortal bi-iliac stent graft is noted. Numerous tortuous collateral vessels are seen along the splenic hilum and perigastric region. A cluster of mildly enlarged aortocaval and periportal lymph nodes are seen. Enlarged bilateral inguinal lymph nodes are also present. Reproductive: Prostate is unremarkable. Other: Mild to moderate severity subcutaneous and para muscular inflammatory fat stranding is seen along the lateral aspect of the abdominal and pelvic wall on the right. There is a small amount of perihepatic and perisplenic fluid. A small amount of fluid is also seen within the right lower quadrant and along the anterolateral aspect of the pelvis on the right. Musculoskeletal: A 19.2 cm x 3.9 cm x 13.6 cm outpouching of fat is seen  along the anterior aspect of the lower pelvic wall. Marked severity associated subcutaneous inflammatory fat stranding and cutaneous thickening is seen within this region. This extends inferiorly along the dorsal aspect of the corpus cavernosum. There is no evidence of associated fluid collection or abscess. Extensive scrotal edema is also seen. No soft tissue air is identified. There is a total right hip replacement with associated streak artifact and subsequently limited evaluation of the adjacent osseous and soft tissue structures. A compression fracture deformity of indeterminate age is seen involving the L4 vertebral body. This represents a new finding when compared to the prior study. Multilevel degenerative changes are seen throughout the lumbar spine. IMPRESSION: 1. Marked severity cellulitis within the anterior aspect of the lower pelvic wall, with extension to involve  the scrotum and dorsal aspect of the corpus cavernosum. 2. Extensive scrotal edema. 3. Cirrhotic liver with evidence of portal hypertension, as described above. 4. Small amount of ascites. 5. Patent aortal bi-iliac stent graft. 6. Age-indeterminate compression fracture deformity of the L4 vertebral body. 7. Total right hip replacement. Electronically Signed   By: Virgina Norfolk M.D.   On: 01/31/2022 22:28   US SCROTUM W/DOPPLER  Result Date: 01/31/2022 CLINICAL DATA:  Bilateral testicular swelling x3 days EXAM: SCROTAL ULTRASOUND DOPPLER ULTRASOUND OF THE TESTICLES TECHNIQUE: Complete ultrasound examination of the testicles, epididymis, and other scrotal structures was performed. Color and spectral Doppler ultrasound were also utilized to evaluate blood flow to the testicles. COMPARISON:  None Available. FINDINGS: Right testicle Measurements: 3.3 x 1.8 x 2.4 cm. No mass or microlithiasis visualized. Left testicle Measurements: 2.9 x 2.1 x 2.2 cm. No mass or microlithiasis visualized. Right epididymis:  8 x 5 x 4 mm epididymal head  cyst. Left epididymis:  Normal in size and appearance. Hydrocele:  None visualized. Varicocele:  None visualized. Pulsed Doppler interrogation of both testes demonstrates normal low resistance arterial and venous waveforms bilaterally. Additional comments: Scrotal wall thickening/edema. No drainable fluid collection/abscess. IMPRESSION: Normal sonographic appearance of the bilateral testes. No evidence of testicular torsion. Scrotal wall thickening/edema. No drainable fluid collection/abscess. Electronically Signed   By: Julian Hy M.D.   On: 01/31/2022 18:58   DG Chest 2 View  Result Date: 01/31/2022 CLINICAL DATA:  Scrotal swelling beginning 2 days ago Shortness of breath EXAM: CHEST - 2 VIEW COMPARISON:  CT chest, abdomen, and pelvis 07/14/2020 FINDINGS: Moderate cardiomegaly and minimal pulmonary vascular congestion. Lungs are clear. ACDF changes seen in the lower cervical spine. Abdominal aortic stent endograft partially visualized. IMPRESSION: Moderate cardiomegaly with minimal pulmonary vascular congestion. No airspace opacity to indicate pulmonary edema. Electronically Signed   By: Miachel Roux M.D.   On: 01/31/2022 15:50    Microbiology: Results for orders placed or performed during the hospital encounter of 01/31/22  Culture, blood (routine x 2)     Status: None (Preliminary result)   Collection Time: 01/31/22  7:29 PM   Specimen: BLOOD RIGHT ARM  Result Value Ref Range Status   Specimen Description BLOOD RIGHT ARM  Final   Special Requests   Final    BOTTLES DRAWN AEROBIC AND ANAEROBIC Blood Culture results may not be optimal due to an excessive volume of blood received in culture bottles   Culture   Final    NO GROWTH < 12 HOURS Performed at [facility], [address] [place], [place] [address]    Report Status PENDING  Incomplete   *Note: Due to a large number of results and/or encounters for the requested time period, some results have not been displayed. A  complete set of results can be found in Results Review.    Labs: CBC: Recent Labs  Lab 01/31/22 1517 02/01/22 0555  WBC 3.9* 3.7*  NEUTROABS 2.7  --   HGB 10.1* 9.7*  HCT 31.3* 30.5*  MCV 100.6* 101.0*  PLT 31* 28*   Basic Metabolic Panel: Recent Labs  Lab 01/31/22 1517 02/01/22 0555  NA 135 136  K 4.3 4.4  CL 100 102  CO2 29 29  GLUCOSE 138* 92  BUN 18 15  CREATININE 1.07 1.05  CALCIUM 8.5* 8.3*   Liver Function Tests: No results for input(s): "AST", "ALT", "ALKPHOS", "BILITOT", "PROT", "ALBUMIN" in the last 168 hours. CBG: No results for input(s): "GLUCAP" in the  last 168 hours.  Discharge time spent: greater than 30 minutes.  Signed: Max Sane, MD [facility] 02/01/2022

## 2022-02-01 NOTE — Care Management Obs Status (Signed)
[place] NOTIFICATION   Patient Details  Name: [name] MRN: [id number] Date of Birth: January 18, 1956   Medicare Observation Status Notification Given:  Yes    Shelbie Hutching, RN 02/01/2022, 9:45 AM

## 2022-02-01 NOTE — ED Notes (Signed)
Case manager spoke with family. Pt will transport to home via EMS.

## 2022-02-01 NOTE — Care Management CC44 (Signed)
Condition Code 44 Documentation Completed  Patient Details  Name: [name] MRN: [id number] Date of Birth: [birth year]   Condition Code 44 given:  Yes Patient signature on Condition Code 44 notice:  Yes Documentation of 2 MD's agreement:  Yes Code 44 added to claim:  Yes    Shelbie Hutching, RN 02/01/2022, 9:45 AM

## 2022-02-01 NOTE — ED Notes (Signed)
Pt is sleeping. Overdue PO meds will be given but pt has been asleep.

## 2022-02-01 NOTE — TOC Initial Note (Signed)
Transition of Care [facility]) - Initial/Assessment Note    Patient Details  Name: [name] MRN: [id number] Date of Birth: [birth year]  Transition of Care [facility] CM/SW Contact:    Shelbie Hutching, RN Phone Number: 02/01/2022, 9:47 AM  Clinical Narrative:                 Patient seen in the hospital for scrotum and leg swelling.  Patient is a current [place] hospice patient with liver cirrhosis.  Patient would like to go home, patient will be discharged back home with hospice today.  Hospice liason Misty Green aware of discharge today.  Wife notified that patient will discharge home, she agrees with EMS transport.  ED secretary will arrange EMS transport for patient home.  Met with patient at the bedside and reviewed MOON Code 44, he verbalizes understanding.    Expected Discharge Plan: Home w Hospice Care Barriers to Discharge: Barriers Resolved   Patient Goals and CMS Choice Patient states their goals for this hospitalization and ongoing recovery are:: Patient just wants to go home CMS Medicare.gov Compare Post Acute Care list provided to:: Patient Choice offered to / list presented to : Patient  Expected Discharge Plan and Services Expected Discharge Plan: Home w Hospice Care   Discharge Planning Services: CM Consult Post Acute Care Choice: Resumption of Svcs/PTA Provider Living arrangements for the past 2 months: Single Family Home Expected Discharge Date: 02/01/22               DME Arranged: N/A DME Agency: NA       HH Arranged: NA HH Agency: NA        Prior Living Arrangements/Services Living arrangements for the past 2 months: Single Family Home Lives with:: Spouse Patient language and need for interpreter reviewed:: Yes Do you feel safe going back to the place where you live?: Yes      Need for Family Participation in Patient Care: Yes (Comment) Care giver support system in place?: Yes (comment) Current home services: DME, Home RN Criminal Activity/Legal  Involvement Pertinent to Current Situation/Hospitalization: No - Comment as needed  Activities of Daily Living      Permission Sought/Granted Permission sought to share information with : Case Manager, Customer service manager, Family Supports Permission granted to share information with : Yes, Verbal Permission Granted  Share Information with NAME: Curlee Bogan  Permission granted to share info w AGENCY: [place] granted to share info w Relationship: spouse  Permission granted to share info w Contact Information: [phone]  Emotional Assessment Appearance:: Appears stated age Attitude/Demeanor/Rapport: Engaged Affect (typically observed): Accepting Orientation: : Oriented to Self, Oriented to Place, Oriented to  Time, Oriented to Situation Alcohol / Substance Use: Not Applicable Psych Involvement: No (comment)  Admission diagnosis:  Cellulitis, scrotum [N49.2] Patient Active Problem List   Diagnosis Date Noted   Hospice care patient 02/01/2022   Cellulitis of perineum    Scrotal swelling    Scrotal pain    Combined congestive systolic and diastolic heart failure ([place]) 01/31/2022   Cellulitis, scrotum 01/31/2022   Venous stasis ulcer of left lower leg with edema of left lower leg ([place]) 01/06/2022   Lymphadenopathy of head and neck region 12/15/2021   FTT (failure to thrive) in adult 11/16/2021   Driving safety issue 10/06/2021   Decreased visual acuity 10/06/2021   Cognitive safety issue 06/27/2021   Status post right hip replacement 12/04/2020   Carotid stenosis, asymptomatic, bilateral 08/28/2020   Statin intolerance  08/05/2020   Mass of both parotid glands 08/05/2020   Memory deficit 01/10/2020   Hearing loss 08/07/2018   Abdominal aortic aneurysm (AAA) without rupture ([place]) 01/02/2018   History of MRSA infection 01/02/2018   History of myocardial infarction 01/02/2018   Aortic atherosclerosis ([place]) 01/01/2018   MDD (major depressive  disorder), recurrent severe, without psychosis ([place]) 11/17/2017   Candidal intertrigo 05/08/2017   Falls frequently 03/13/2017   Abnormal drug screen 07/09/2016   Thrombocytopenia ([place]) 04/29/2016   Anemia 04/29/2016   Protein-calorie malnutrition ([place]) 04/29/2016   RUQ abdominal pain 04/26/2016   Encounter for chronic pain management 01/04/2016   Preop cardiovascular exam 09/01/2014   Chronic fatigue 07/22/2014   Esophageal dysphagia 07/22/2014   Gross hematuria 05/19/2014   Encounter for general adult medical examination with abnormal findings 04/21/2014   Advanced care planning/counseling discussion 04/21/2014   Orthostatic hypotension 03/13/2014   Cirrhosis of liver with ascites ([place]) 01/03/2014   Osteoarthritis of right hip 08/16/2012   Medicare annual wellness visit, subsequent 06/07/2012   Chronic dyspnea 10/06/2011   DDD (degenerative disc disease), cervical    Vitamin D deficiency 03/14/2011   Vitamin B12 deficiency 08/05/2010   Prediabetes 06/27/2010   OSA (obstructive sleep apnea) 05/16/2010   Cervical spondylosis 05/05/2010   Obesity, morbid, BMI 40.0-49.9 ([place]) 05/03/2010   Hidradenitis 05/03/2010   Smoker 10/21/2009   HLD (hyperlipidemia) 09/07/2009   Gout 09/07/2009   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Phone: [phone]:  [phone] [address]Phone: [phone]: [phone]    Social Determinants of Health (SDOH) Interventions    Readmission Risk Interventions     No data to display

## 2022-02-02 ENCOUNTER — Telehealth: Payer: Self-pay | Admitting: Family Medicine

## 2022-02-02 ENCOUNTER — Other Ambulatory Visit: Payer: Self-pay

## 2022-02-02 NOTE — Telephone Encounter (Signed)
Katharine Look from [facility] called in stating that patient was discharged and was suppose to have spironolactone but not sure what pharmacy it was sent over to. He was suppose to be prescribed an antibiotic but they never prescribed one to him. He will need a break through medication for pain and he prefers morphine because that's what he was taking there to help with breathing. [id number] [place], [place] [address]arine Lookcan be reached at [phone] Thank you!

## 2022-02-02 NOTE — Telephone Encounter (Signed)
Katharine Look called back in and stated that in the past he was prescribed Oxycodone to help with pain. They not sure which one because the hospital gave morphine. She stated whichever one you think is best. Thank you!

## 2022-02-03 ENCOUNTER — Other Ambulatory Visit: Payer: Self-pay | Admitting: Internal Medicine

## 2022-02-03 MED ORDER — SPIRONOLACTONE 100 MG PO TABS
100.0000 mg | ORAL_TABLET | Freq: Two times a day (BID) | ORAL | 1 refills | Status: DC
Start: 2022-02-03 — End: 2022-02-03

## 2022-02-03 MED ORDER — OXYCODONE HCL 5 MG PO CAPS: 5.0000 mg | ORAL_CAPSULE | Freq: Three times a day (TID) | ORAL | 0 refills | Status: DC | PRN

## 2022-02-03 MED ORDER — DOXYCYCLINE HYCLATE 100 MG PO TABS: 100.0000 mg | ORAL_TABLET | Freq: Two times a day (BID) | ORAL | 0 refills | Status: DC

## 2022-02-03 MED ORDER — SPIRONOLACTONE 100 MG PO TABS: 100.0000 mg | ORAL_TABLET | Freq: Two times a day (BID) | ORAL | 6 refills | Status: DC

## 2022-02-03 NOTE — Telephone Encounter (Signed)
Left message to return call to our office.  

## 2022-02-03 NOTE — Progress Notes (Unsigned)
{  Select_TRH_Note:[id number]} 

## 2022-02-03 NOTE — Addendum Note (Signed)
Addended by: Ria Bush on: 02/03/2022 02:15 PM   Modules accepted: Orders

## 2022-02-03 NOTE — Telephone Encounter (Addendum)
Diuretic - he should have spironolactone at home- hospital increased this to 149m bid. I went ahead and sent in BID dose to his local pharmacy. Antibiotic - he was treated with rocephin while hospitalized but I don't see where they recommended further antibiotics upon discharge. I have gone ahead and sent in doxycycline 7d course to his pharmacy.   Chronic pain - I've sent in oxycodone 5/3273mto use TID PRN breakthrough pain despite fentanyl. Caution i he's having multiple falls as extra opiate may increase this risk. Update [place] how he does with this.

## 2022-02-03 NOTE — Telephone Encounter (Signed)
Katharine Look from [facility] called in following up on this matter regarding the pain medicine, diuretic, and the antibiotic. Katharine Look stated that patient is getting upset and has falling a few times already. She would like for someone to give her call. Katharine Look can be reached at [phone]. Thank you!

## 2022-02-05 LAB — CULTURE, BLOOD (ROUTINE X 2): Culture: NO GROWTH

## 2022-02-07 ENCOUNTER — Other Ambulatory Visit: Payer: Self-pay | Admitting: Family Medicine

## 2022-02-07 MED ORDER — FENTANYL 75 MCG/HR TD PT72: 1.0000 | MEDICATED_PATCH | TRANSDERMAL | 0 refills | Status: DC

## 2022-02-07 NOTE — Telephone Encounter (Signed)
ERx 

## 2022-02-07 NOTE — Telephone Encounter (Signed)
  Encourage patient to contact the pharmacy for refills or they can request refills through [facility]  Did the patient contact the pharmacy: y    LAST APPOINTMENT DATE: 02/02/22  NEXT APPOINTMENT DATE:  MEDICATION:fentaNYL (Alpha) 11 MCG/HR  Is the patient out of medication? n  If not, how much is left?4 days left  Is this a 90 day supply:   [id number] [place] [place]:  [phone] Fax:  [phone]     Let patient know to contact pharmacy at the end of the day to make sure medication is ready.  Please notify patient to allow 48-72 hours to process

## 2022-02-07 NOTE — Telephone Encounter (Signed)
Saundra from [place] called to return a call.

## 2022-02-07 NOTE — Telephone Encounter (Signed)
Lvm asking Katharine Look (of [facility]), to call back.  Need to relay Dr. Synthia Innocent message.

## 2022-02-07 NOTE — Telephone Encounter (Signed)
Last refill 01/11/22 #10 Last office visit 01/11/22

## 2022-02-07 NOTE — Telephone Encounter (Signed)
Spoke with Katharine Look relaying Dr. Synthia Innocent message.  She verbalizes understanding and states pt has all meds but needs fentanyl refill.  I see refill was was [facility] today.

## 2022-02-08 NOTE — Telephone Encounter (Signed)
Nicoderm patch Last filled:  #28 patch Last OV:  01/11/22, 1 wk f/u Next OV:  none

## 2022-02-09 ENCOUNTER — Ambulatory Visit (INDEPENDENT_AMBULATORY_CARE_PROVIDER_SITE_OTHER): Payer: Medicare HMO | Admitting: Nurse Practitioner

## 2022-02-09 ENCOUNTER — Encounter (INDEPENDENT_AMBULATORY_CARE_PROVIDER_SITE_OTHER): Payer: Self-pay

## 2022-02-09 ENCOUNTER — Other Ambulatory Visit: Payer: Self-pay | Admitting: Family Medicine

## 2022-02-09 ENCOUNTER — Other Ambulatory Visit (INDEPENDENT_AMBULATORY_CARE_PROVIDER_SITE_OTHER): Payer: Medicare HMO

## 2022-02-10 ENCOUNTER — Telehealth: Payer: Self-pay

## 2022-02-10 NOTE — Telephone Encounter (Signed)
Prior auth for fentaNYL 75MCG/HR 72 hr patches has been approved. Silverio Lay Key: [employer]neil Sober - PA Case ID: [id number] Approved today PA Case: [id number], Status: Approved, Coverage Starts on: 06/05/2021 12:00:00 AM, Coverage Ends on: 06/04/2022 12:00:00 AM.   Notified patient via mychart.

## 2022-02-10 NOTE — Telephone Encounter (Signed)
[facility] stated that insurance is denying the dose for the Fentanyl. Lennette Bihari stated the notice that he gets from the insurance company is that the morphine dose is too high. Lennette Bihari stated if this dose is being prescribed will need a PA done.

## 2022-02-10 NOTE — Telephone Encounter (Signed)
Rx called to [place] at pharmacy.

## 2022-02-10 NOTE — Telephone Encounter (Addendum)
Can we do PA for fentanyl dose?  Patient is a hospice patient who takes opiate for chronic pain due to severe osteoarthritis and charcot-marie-tooth disease.  This has been a chronic med for over a year. Risk of chronic opiate use has been reviewed with patient.  His pain medications are monitored and benefits outweight risks

## 2022-02-10 NOTE — Telephone Encounter (Signed)
Patient wife called and stated that [facility] need [facility] for medication fentaNYL.

## 2022-02-10 NOTE — Telephone Encounter (Signed)
Left message on voicemail for patient's wife to call the office back. Script was sent to the pharmacy 02/07/22 #10 patches.

## 2022-02-10 NOTE — Telephone Encounter (Signed)
Prior auth started for fentaNYL 75MCG/HR 72 hr patches. Silverio Lay Key: [employer]neil Sober - PA Case ID: [id number] Waiting for determination.

## 2022-02-17 NOTE — Addendum Note (Signed)
Addended by: Emelia Salisbury C on: 02/17/2022 10:04 AM   Modules accepted: Orders

## 2022-02-17 NOTE — Telephone Encounter (Signed)
Last office visit 01/11/22  Last refill 9/1//23 #45 UDS 03/22/21

## 2022-02-17 NOTE — Telephone Encounter (Addendum)
[name] from [place] called in stated pt  need Refill on RX  [id number]   Encourage patient to contact the pharmacy for refills or they can request refills through [facility]:  Please schedule appointment if longer than 1 year  NEXT APPOINTMENT DATE:  MEDICATION:oxycodone (OXY-IR) 5 MG capsule  Is the patient out of medication?   [id number] [place], [place] [facility] [id number]   Let patient know to contact pharmacy at the end of the day to make sure medication is ready.  Please notify patient to allow 48-72 hours to process  CLINICAL FILLS OUT ALL BELOW:   LAST REFILL:  QTY:  REFILL DATE:    OTHER COMMENTS:    Okay for refill?  Please advise

## 2022-02-18 MED ORDER — OXYCODONE HCL 5 MG PO CAPS: 5.0000 mg | ORAL_CAPSULE | Freq: Three times a day (TID) | ORAL | 0 refills | Status: DC | PRN

## 2022-02-18 NOTE — Telephone Encounter (Signed)
ERx 

## 2022-02-21 ENCOUNTER — Telehealth: Payer: Self-pay

## 2022-02-21 NOTE — Telephone Encounter (Signed)
Received following faxed message from [facility]:  Oxycodone capsules not covered/in stock.  Please send as tablets.

## 2022-02-22 MED ORDER — OXYCODONE HCL 5 MG PO TABS: 5.0000 mg | ORAL_TABLET | ORAL | 0 refills | Status: DC | PRN

## 2022-02-22 NOTE — Addendum Note (Signed)
Addended by: Ria Bush on: 02/22/2022 08:07 AM   Modules accepted: Orders

## 2022-02-22 NOTE — Telephone Encounter (Signed)
Oxycodone tablets sent to pharmacy.

## 2022-02-24 ENCOUNTER — Telehealth: Payer: Self-pay | Admitting: Family Medicine

## 2022-02-24 ENCOUNTER — Other Ambulatory Visit: Payer: Self-pay | Admitting: Family Medicine

## 2022-02-24 MED ORDER — TEMAZEPAM 15 MG PO CAPS: 15.0000 mg | ORAL_CAPSULE | Freq: Every evening | ORAL | 0 refills | Status: DC | PRN

## 2022-02-24 NOTE — Addendum Note (Signed)
Addended by: Brenton Grills on: 02/15/6858 92:34 PM   Modules accepted: Orders

## 2022-02-24 NOTE — Addendum Note (Signed)
Addended by: Ria Bush on: 02/24/2022 04:47 PM   Modules accepted: Orders

## 2022-02-24 NOTE — Telephone Encounter (Signed)
  Encourage patient to contact the pharmacy for refills or they can request refills through [facility]  Did the patient contact the pharmacy: No  LAST APPOINTMENT DATE: 01/11/2022  NEXT APPOINTMENT DATE: N/A  MEDICATION: temazepam (RESTORIL) 15 MG capsule  Is the patient out of medication? No  If not, how much is left? 4 left  [id number] [place] [place] [address] Let patient know to contact pharmacy at the end of the day to make sure medication is ready.  Please notify patient to allow 48-72 hours to process

## 2022-02-24 NOTE — Telephone Encounter (Signed)
Spoke with [name] relaying Dr. Synthia Innocent message.  Verbalizes understanding.  Says they will give pt 10 mg TID.

## 2022-02-24 NOTE — Addendum Note (Signed)
Addended by: Brenton Grills on: 5/95/3967 28:97 PM   Modules accepted: Orders

## 2022-02-24 NOTE — Telephone Encounter (Signed)
Name of Medication: Oxycodone-APAP Name of Pharmacy: [facility] Last Fill or Written Date and Quantity: 02/22/22, #45 Last Office Visit and Type: 01/11/22 Next Office Visit and Type: none Last Controlled Substance Agreement Date: 03/22/21 Last UDS: 03/22/21

## 2022-02-24 NOTE — Telephone Encounter (Signed)
I sent in number 45 tablets of oxycodone 5 mg on Wednesday. Reasonable to try 2 tablets at a time, but need to be very cautious with oversedation in his liver disease.  Try with current supply and let us know how he does with this, and how much he is taking.

## 2022-02-24 NOTE — Telephone Encounter (Signed)
ERx 

## 2022-02-24 NOTE — Telephone Encounter (Signed)
Name of Medication: Temazepam Name of Pharmacy: [facility] Last Fill or Written Date and Quantity: 01/27/22, #30 Last Office Visit and Type: 01/11/22 Next Office Visit and Type: none Last Controlled Substance Agreement Date: 03/22/21 Last UDS: 03/22/21

## 2022-02-24 NOTE — Telephone Encounter (Signed)
Ivin Booty from [facility] called in stating that patient stated the oxyCODONE (OXY IR/ROXICODONE) 5 MG immediate release tablet isn't helping his pain at all. He was wondering if he could get an increase to maybe taking 77m. Please advise. Thank you!

## 2022-02-28 ENCOUNTER — Telehealth: Payer: Self-pay | Admitting: Family Medicine

## 2022-02-28 MED ORDER — CEPHALEXIN 500 MG PO CAPS: 500.0000 mg | ORAL_CAPSULE | Freq: Three times a day (TID) | ORAL | 0 refills | Status: DC

## 2022-02-28 NOTE — Telephone Encounter (Signed)
Spoke to [name] and was advised that symptoms started Sunday. [name] stated that there is one big blister around the ankle which is almost like a band. [name] stated that he does not have a fever, but the redness appears like the cellulitis that he has had before. Pharmacy [facility]

## 2022-02-28 NOTE — Telephone Encounter (Signed)
We may try keflex 516m 3 times a day for 5 days.  Recommend office visit if not improved with this.  This is a cousin to penicillin and I noted nausea/vomiting side effect when penicillin previously tried - but I think he should tolerate well. If vomiting develops, stop abx and let [place]now.

## 2022-02-28 NOTE — Telephone Encounter (Signed)
[name] of [facility] call medication for infection of cellulitis to left lower leg. Call back [phone]. Wife has to go to work at 3 pm. She seen him yesterday and it started Sunday. Patient refused to go to the hospital yesterday.

## 2022-02-28 NOTE — Telephone Encounter (Signed)
Spoke with Lujean Rave of [facility] relaying Dr. Synthia Innocent message.  Verbalizes understanding.

## 2022-03-03 ENCOUNTER — Other Ambulatory Visit: Payer: Self-pay | Admitting: Family Medicine

## 2022-03-03 NOTE — Telephone Encounter (Signed)
Crystal from Hosp[facility] called and stated patient needs a refill oxycodone tablets. Call back number [phone].

## 2022-03-03 NOTE — Telephone Encounter (Signed)
Name of Medication: Oxycodone Name of Pharmacy: [facility] Last Fill or Written Date and Quantity: 02/22/22, #45 Last Office Visit and Type: 01/11/22, 1 wk f/u Next Office Visit and Type: none Last Controlled Substance Agreement Date: 03/22/21 Last UDS: 03/22/21

## 2022-03-05 MED ORDER — OXYCODONE HCL 5 MG PO TABS: 5.0000 mg | ORAL_TABLET | ORAL | 0 refills | Status: DC | PRN

## 2022-03-05 NOTE — Telephone Encounter (Signed)
I've refilled. plz get update to see how patient is taking oxycodone currently (ie how many per day is he taking) as last we spoke he was going to try 2 at a time.

## 2022-03-06 NOTE — Telephone Encounter (Signed)
Spoke with pt's wife, [name] (on dpr).  She confirms pt is taking 2 tabs TID (6 tabs total).

## 2022-03-14 ENCOUNTER — Other Ambulatory Visit: Payer: Self-pay | Admitting: Family Medicine

## 2022-03-14 NOTE — Telephone Encounter (Signed)
Last refill 02/07/22 #10 Last office visit 01/11/22

## 2022-03-14 NOTE — Telephone Encounter (Signed)
[facility]er Name: [facility]  Call back phone #: [phone]  MEDICATION(S):  fentaNYL ([place]) 75 MCG/HR  Days of Med Remaining: 0  Has the patient contacted their pharmacy (YES/NO)? NO What did pharmacy advise?   [facility] [place]  ~~~Please advise patient/caregiver to allow 2-3 business days to process RX refills.

## 2022-03-15 MED ORDER — FENTANYL 75 MCG/HR TD PT72: 1.0000 | MEDICATED_PATCH | TRANSDERMAL | 0 refills | Status: DC

## 2022-03-15 NOTE — Telephone Encounter (Signed)
ERx 

## 2022-03-15 NOTE — Telephone Encounter (Signed)
Caller Name: [facility]   Call back phone #: [phone]  MEDICATION(S):  oxyCODONE (OXY IR/ROXICODONE) 5 MG immediate release tablet  Days of Med Remaining: 0  Has the patient contacted their pharmacy (YES/NO)? NO What did pharmacy advise?   [facility] [place]  ~~~Please advise patient/caregiver to allow 2-3 business days to process RX refills.

## 2022-03-15 NOTE — Telephone Encounter (Signed)
Left a message on voicemail for [name] to call the office back.

## 2022-03-16 NOTE — Telephone Encounter (Addendum)
Name of Medication: Oxycodone Name of Pharmacy: [facility] Last Fill or Written Date and Quantity: 03/05/22, #45 Last Office Visit and Type: 01/11/22, 1 wk f/u Next Office Visit and Type: none Last Controlled Substance Agreement Date: 03/22/21 Last UDS: 03/22/21

## 2022-03-16 NOTE — Addendum Note (Signed)
Addended by: Brenton Grills on: 69/24/9324 11:15 AM   Modules accepted: Orders

## 2022-03-17 MED ORDER — OXYCODONE HCL 5 MG PO TABS: 5.0000 mg | ORAL_TABLET | ORAL | 0 refills | Status: DC | PRN

## 2022-03-17 NOTE — Addendum Note (Signed)
Addended by: Ria Bush on: 03/17/2022 03:10 PM   Modules accepted: Orders

## 2022-03-17 NOTE — Telephone Encounter (Addendum)
Plz notify #45 oxycodone ERx to pharmacy.  Can hospice fill more than 15d supply at a time?

## 2022-03-17 NOTE — Telephone Encounter (Signed)
Lvm asking Katharine Look, of [facility], to call back.  Need to relay Dr. Synthia Innocent message and get answer to his question.

## 2022-03-17 NOTE — Telephone Encounter (Signed)
[name] from [facility] called back in to follow up on this refill request. She stated they have been waiting for 3 days.

## 2022-03-20 NOTE — Telephone Encounter (Signed)
Katharine Look with [facility] notified as instructed by telephone.  Katharine Look stated they can not fill more than 15 day supply at a time. Katharine Look stated that she is going to tell the patient's wife that they need more notice on a refill other than the day that they are out.

## 2022-03-23 ENCOUNTER — Telehealth: Payer: Self-pay | Admitting: Family Medicine

## 2022-03-23 NOTE — Telephone Encounter (Signed)
Last filled #45 6 days ago. This should be 14 day supply, but note says he has 2 days left. How many oxycodone 55m tablets is he taking each day?

## 2022-03-23 NOTE — Telephone Encounter (Signed)
  Encourage patient to contact the pharmacy for refills or they can request refills through [facility]  Did the patient contact the pharmacy:  NO   LAST APPOINTMENT DATE: 01/11/2022  NEXT APPOINTMENT DATE:  MEDICATION:oxyCODONE (OXY IR/ROXICODONE) 5 MG immediate release tablet  Is the patient out of medication? no  If not, how much is left? 2 days left  Is this a 90 day supply: 45  [id number] [place], [place]:  [phone] Fax:  [phone]     Let patient know to contact pharmacy at the end of the day to make sure medication is ready.  Please notify patient to allow 48-72 hours to process

## 2022-03-23 NOTE — Telephone Encounter (Signed)
Name of Medication: Oxycodone Name of Pharmacy: [facility] Last Fill or Written Date and Quantity: 03/17/22, #45 Last Office Visit and Type: 01/11/22, 1 wk f/u Next Office Visit and Type: none Last Controlled Substance Agreement Date: 03/22/21 Last UDS: 03/22/21

## 2022-03-24 NOTE — Telephone Encounter (Signed)
Lvm asking pt's wife, [name] (on dpr) to call back.  Need to relay Dr. Synthia Innocent message and get answer to his question.

## 2022-03-27 NOTE — Telephone Encounter (Signed)
Lvm asking pt's wife, [name] (on dpr) to call back.  Need to relay Dr. Synthia Innocent message and get answer to his question.

## 2022-03-28 NOTE — Telephone Encounter (Signed)
Lvm asking pt's wife, [name] (on dpr) to call back.  Need to relay Dr. Synthia Innocent message and get answer to his question.

## 2022-03-29 NOTE — Telephone Encounter (Signed)
Noted  

## 2022-03-29 NOTE — Telephone Encounter (Signed)
Spoke with pt's wife, [name] (on dpr), relaying Dr. Synthia Innocent message about pt's oxycodone rx.  She states pt is taking 3 tablets a day and says the hospice doc is filling pt's meds now.  Then she had to disconnect call due to someone at her door.

## 2022-04-03 ENCOUNTER — Encounter (INDEPENDENT_AMBULATORY_CARE_PROVIDER_SITE_OTHER): Payer: Self-pay

## 2022-04-10 ENCOUNTER — Other Ambulatory Visit: Payer: Self-pay | Admitting: Family Medicine

## 2022-04-10 NOTE — Telephone Encounter (Signed)
Refill Pristiq Last refill 01/09/22 #90 Last office visit 01/11/22

## 2022-04-12 ENCOUNTER — Other Ambulatory Visit: Payer: Self-pay | Admitting: Family Medicine

## 2022-04-12 MED ORDER — OXYCODONE HCL 5 MG PO TABS: 5.0000 mg | ORAL_TABLET | ORAL | 0 refills | Status: DC | PRN

## 2022-04-12 NOTE — Telephone Encounter (Signed)
Hospice nurse Lujean Rave notified that script has been sent in. Lujean Rave stated that it might be better to have Hospice doctor take over these refills. Lujean Rave stated that the patient's wife calls real often and does not give adequate notice when a refill is needed.

## 2022-04-12 NOTE — Telephone Encounter (Signed)
ERx. Plz notify [facility].

## 2022-04-12 NOTE — Telephone Encounter (Signed)
Ennis Forts from [facility] called and stated patient medication oxyCODONE (OXY IR/ROXICODONE) 5 MG immediate release tablet  [facility]  [id number] [place], [place]: [phone] Fax: [phone]

## 2022-04-12 NOTE — Telephone Encounter (Signed)
Last refill 03/17/22 #45 Last office visit 01/11/22

## 2022-04-15 ENCOUNTER — Inpatient Hospital Stay
Admission: EM | Admit: 2022-04-15 | Discharge: 2022-04-17 | DRG: 442 | Disposition: A | Discharge status: Hospice | Attending: Hospitalist | Admitting: Hospitalist

## 2022-04-15 ENCOUNTER — Emergency Department

## 2022-04-15 ENCOUNTER — Encounter: Payer: Self-pay | Admitting: Emergency Medicine

## 2022-04-15 ENCOUNTER — Other Ambulatory Visit: Payer: Self-pay

## 2022-04-15 DIAGNOSIS — Z955 Presence of coronary angioplasty implant and graft: Secondary | ICD-10-CM

## 2022-04-15 DIAGNOSIS — I10 Essential (primary) hypertension: Secondary | ICD-10-CM | POA: Diagnosis not present

## 2022-04-15 DIAGNOSIS — E785 Hyperlipidemia, unspecified: Secondary | ICD-10-CM | POA: Diagnosis present

## 2022-04-15 DIAGNOSIS — I5042 Chronic combined systolic (congestive) and diastolic (congestive) heart failure: Secondary | ICD-10-CM | POA: Diagnosis present

## 2022-04-15 DIAGNOSIS — Z515 Encounter for palliative care: Secondary | ICD-10-CM | POA: Diagnosis not present

## 2022-04-15 DIAGNOSIS — Z79899 Other long term (current) drug therapy: Secondary | ICD-10-CM

## 2022-04-15 DIAGNOSIS — F17210 Nicotine dependence, cigarettes, uncomplicated: Secondary | ICD-10-CM | POA: Diagnosis present

## 2022-04-15 DIAGNOSIS — Z801 Family history of malignant neoplasm of trachea, bronchus and lung: Secondary | ICD-10-CM | POA: Diagnosis not present

## 2022-04-15 DIAGNOSIS — K7581 Nonalcoholic steatohepatitis (NASH): Secondary | ICD-10-CM | POA: Diagnosis present

## 2022-04-15 DIAGNOSIS — K7460 Unspecified cirrhosis of liver: Secondary | ICD-10-CM | POA: Diagnosis present

## 2022-04-15 DIAGNOSIS — R188 Other ascites: Secondary | ICD-10-CM | POA: Diagnosis present

## 2022-04-15 DIAGNOSIS — J449 Chronic obstructive pulmonary disease, unspecified: Secondary | ICD-10-CM | POA: Diagnosis present

## 2022-04-15 DIAGNOSIS — I5040 Unspecified combined systolic (congestive) and diastolic (congestive) heart failure: Secondary | ICD-10-CM | POA: Diagnosis present

## 2022-04-15 DIAGNOSIS — L03115 Cellulitis of right lower limb: Secondary | ICD-10-CM | POA: Diagnosis not present

## 2022-04-15 DIAGNOSIS — E119 Type 2 diabetes mellitus without complications: Secondary | ICD-10-CM | POA: Diagnosis present

## 2022-04-15 DIAGNOSIS — Z888 Allergy status to other drugs, medicaments and biological substances status: Secondary | ICD-10-CM

## 2022-04-15 DIAGNOSIS — I110 Hypertensive heart disease with heart failure: Secondary | ICD-10-CM | POA: Diagnosis not present

## 2022-04-15 DIAGNOSIS — Z1152 Encounter for screening for COVID-19: Secondary | ICD-10-CM

## 2022-04-15 DIAGNOSIS — W19XXXA Unspecified fall, initial encounter: Secondary | ICD-10-CM | POA: Diagnosis not present

## 2022-04-15 DIAGNOSIS — G894 Chronic pain syndrome: Secondary | ICD-10-CM | POA: Diagnosis present

## 2022-04-15 DIAGNOSIS — I252 Old myocardial infarction: Secondary | ICD-10-CM | POA: Diagnosis not present

## 2022-04-15 DIAGNOSIS — Z8249 Family history of ischemic heart disease and other diseases of the circulatory system: Secondary | ICD-10-CM

## 2022-04-15 DIAGNOSIS — K7682 Hepatic encephalopathy: Secondary | ICD-10-CM | POA: Diagnosis not present

## 2022-04-15 DIAGNOSIS — I469 Cardiac arrest, cause unspecified: Secondary | ICD-10-CM | POA: Diagnosis not present

## 2022-04-15 DIAGNOSIS — Z7401 Bed confinement status: Secondary | ICD-10-CM | POA: Diagnosis not present

## 2022-04-15 DIAGNOSIS — I878 Other specified disorders of veins: Secondary | ICD-10-CM | POA: Diagnosis present

## 2022-04-15 DIAGNOSIS — Z8661 Personal history of infections of the central nervous system: Secondary | ICD-10-CM

## 2022-04-15 DIAGNOSIS — I255 Ischemic cardiomyopathy: Secondary | ICD-10-CM | POA: Diagnosis present

## 2022-04-15 DIAGNOSIS — R410 Disorientation, unspecified: Secondary | ICD-10-CM | POA: Diagnosis not present

## 2022-04-15 DIAGNOSIS — T473X6A Underdosing of saline and osmotic laxatives, initial encounter: Secondary | ICD-10-CM | POA: Diagnosis present

## 2022-04-15 DIAGNOSIS — Z833 Family history of diabetes mellitus: Secondary | ICD-10-CM

## 2022-04-15 DIAGNOSIS — Z885 Allergy status to narcotic agent status: Secondary | ICD-10-CM

## 2022-04-15 DIAGNOSIS — I2510 Atherosclerotic heart disease of native coronary artery without angina pectoris: Secondary | ICD-10-CM | POA: Diagnosis present

## 2022-04-15 DIAGNOSIS — D696 Thrombocytopenia, unspecified: Secondary | ICD-10-CM | POA: Diagnosis present

## 2022-04-15 DIAGNOSIS — Y92009 Unspecified place in unspecified non-institutional (private) residence as the place of occurrence of the external cause: Secondary | ICD-10-CM

## 2022-04-15 DIAGNOSIS — W1830XA Fall on same level, unspecified, initial encounter: Secondary | ICD-10-CM | POA: Diagnosis present

## 2022-04-15 DIAGNOSIS — Z8261 Family history of arthritis: Secondary | ICD-10-CM

## 2022-04-15 DIAGNOSIS — I959 Hypotension, unspecified: Secondary | ICD-10-CM | POA: Diagnosis not present

## 2022-04-15 DIAGNOSIS — G8929 Other chronic pain: Secondary | ICD-10-CM | POA: Diagnosis not present

## 2022-04-15 DIAGNOSIS — J811 Chronic pulmonary edema: Secondary | ICD-10-CM | POA: Diagnosis not present

## 2022-04-15 DIAGNOSIS — Z880 Allergy status to penicillin: Secondary | ICD-10-CM

## 2022-04-15 DIAGNOSIS — J42 Unspecified chronic bronchitis: Secondary | ICD-10-CM

## 2022-04-15 DIAGNOSIS — Z841 Family history of disorders of kidney and ureter: Secondary | ICD-10-CM

## 2022-04-15 LAB — LACTIC ACID, PLASMA
Lactic Acid, Venous: 2.7 mmol/L (ref 0.5–1.9)
Lactic Acid, Venous: 2.4 mmol/L (ref 0.5–1.9)

## 2022-04-15 LAB — CBC WITH DIFFERENTIAL/PLATELET
Abs Immature Granulocytes: 0.02 10*3/uL (ref 0.00–0.07)
Basophils Absolute: 0 10*3/uL (ref 0.0–0.1)
Basophils Relative: 1 %
Eosinophils Absolute: 0.2 10*3/uL (ref 0.0–0.5)
Eosinophils Relative: 4 %
HCT: 30 % — ABNORMAL LOW (ref 39.0–52.0)
Hemoglobin: 9.8 g/dL — ABNORMAL LOW (ref 13.0–17.0)
Immature Granulocytes: 1 %
Lymphocytes Relative: 16 %
Lymphs Abs: 0.7 10*3/uL (ref 0.7–4.0)
MCH: 32.5 pg (ref 26.0–34.0)
MCHC: 32.7 g/dL (ref 30.0–36.0)
MCV: 99.3 fL (ref 80.0–100.0)
Monocytes Absolute: 0.5 10*3/uL (ref 0.1–1.0)
Monocytes Relative: 13 %
Neutro Abs: 2.7 10*3/uL (ref 1.7–7.7)
Neutrophils Relative %: 65 %
Platelets: 34 10*3/uL — ABNORMAL LOW (ref 150–400)
RBC: 3.02 MIL/uL — ABNORMAL LOW (ref 4.22–5.81)
RDW: 17.1 % — ABNORMAL HIGH (ref 11.5–15.5)
Smear Review: DECREASED
WBC: 4.1 10*3/uL (ref 4.0–10.5)
nRBC: 0 % (ref 0.0–0.2)

## 2022-04-15 LAB — COMPREHENSIVE METABOLIC PANEL
ALT: 22 U/L (ref 0–44)
AST: 49 U/L — ABNORMAL HIGH (ref 15–41)
Albumin: 2.5 g/dL — ABNORMAL LOW (ref 3.5–5.0)
Alkaline Phosphatase: 121 U/L (ref 38–126)
Anion gap: 4 — ABNORMAL LOW (ref 5–15)
BUN: 16 mg/dL (ref 8–23)
CO2: 27 mmol/L (ref 22–32)
Calcium: 8.4 mg/dL — ABNORMAL LOW (ref 8.9–10.3)
Chloride: 103 mmol/L (ref 98–111)
Creatinine, Ser: 0.91 mg/dL (ref 0.61–1.24)
GFR, Estimated: >60 mL/min (ref >60)
Glucose, Bld: 120 mg/dL — ABNORMAL HIGH (ref 70–99)
Potassium: 3.6 mmol/L (ref 3.5–5.1)
Sodium: 134 mmol/L — ABNORMAL LOW (ref 135–145)
Total Bilirubin: 2.6 mg/dL — ABNORMAL HIGH (ref 0.3–1.2)
Total Protein: 7.6 g/dL (ref 6.5–8.1)

## 2022-04-15 LAB — BRAIN NATRIURETIC PEPTIDE: B Natriuretic Peptide: 101.4 pg/mL — ABNORMAL HIGH (ref 0.0–100.0)

## 2022-04-15 LAB — URINALYSIS, COMPLETE (UACMP) WITH MICROSCOPIC
Bacteria, UA: NONE SEEN
Bilirubin Urine: NEGATIVE
Glucose, UA: NEGATIVE mg/dL
Ketones, ur: NEGATIVE mg/dL
Leukocytes,Ua: NEGATIVE
Nitrite: NEGATIVE
Protein, ur: NEGATIVE mg/dL
Specific Gravity, Urine: 1.014 (ref 1.005–1.030)
pH: 6 (ref 5.0–8.0)

## 2022-04-15 LAB — LIPASE, BLOOD: Lipase: 34 U/L (ref 11–51)

## 2022-04-15 LAB — PROTIME-INR
INR: 1.4 — ABNORMAL HIGH (ref 0.8–1.2)
Prothrombin Time: 16.9 seconds — ABNORMAL HIGH (ref 11.4–15.2)

## 2022-04-15 LAB — PROCALCITONIN: Procalcitonin: <0.1 ng/mL

## 2022-04-15 LAB — RESP PANEL BY RT-PCR (FLU A&B, COVID) ARPGX2
Influenza A by PCR: NEGATIVE
Influenza B by PCR: NEGATIVE
SARS Coronavirus 2 by RT PCR: NEGATIVE

## 2022-04-15 LAB — AMMONIA: Ammonia: 100 umol/L — ABNORMAL HIGH (ref 9–35)

## 2022-04-15 LAB — APTT: aPTT: 32 seconds (ref 24–36)

## 2022-04-15 MED ORDER — ONDANSETRON HCL 4 MG/2ML IJ SOLN: 4.0000 mg | Freq: Four times a day (QID) | INTRAMUSCULAR | Status: DC | PRN

## 2022-04-15 MED ORDER — GABAPENTIN 300 MG PO CAPS
300.0000 mg | ORAL_CAPSULE | Freq: Two times a day (BID) | ORAL | Status: DC
  Administered 2022-04-15 – 2022-04-17 (×4): 300 mg via ORAL
  Filled 2022-04-15 (×4): qty 1

## 2022-04-15 MED ORDER — FOLIC ACID 1 MG PO TABS: 1.0000 mg | ORAL_TABLET | Freq: Every day | ORAL | Status: DC

## 2022-04-15 MED ORDER — THIAMINE MONONITRATE 100 MG PO TABS
100.0000 mg | ORAL_TABLET | Freq: Every day | ORAL | Status: DC
  Administered 2022-04-15 – 2022-04-17 (×3): 100 mg via ORAL
  Filled 2022-04-15 (×3): qty 1

## 2022-04-15 MED ORDER — ONDANSETRON HCL 4 MG PO TABS: 4.0000 mg | ORAL_TABLET | Freq: Four times a day (QID) | ORAL | Status: DC | PRN

## 2022-04-15 MED ORDER — SODIUM CHLORIDE 0.9 % IV BOLUS (SEPSIS)
2250.0000 mL | Freq: Once | INTRAVENOUS | Status: DC
  Administered 2022-04-15: 2250 mL via INTRAVENOUS

## 2022-04-15 MED ORDER — SODIUM CHLORIDE 0.9 % IV SOLN
2.0000 g | Freq: Once | INTRAVENOUS | Status: AC
  Administered 2022-04-15: 2 g via INTRAVENOUS
  Filled 2022-04-15: qty 12.5

## 2022-04-15 MED ORDER — ADULT MULTIVITAMIN W/MINERALS CH
1.0000 | ORAL_TABLET | Freq: Every day | ORAL | Status: DC
  Administered 2022-04-15 – 2022-04-17 (×3): 1 via ORAL
  Filled 2022-04-15 (×3): qty 1

## 2022-04-15 MED ORDER — PANTOPRAZOLE SODIUM 40 MG PO TBEC
40.0000 mg | DELAYED_RELEASE_TABLET | Freq: Every day | ORAL | Status: DC
  Administered 2022-04-15 – 2022-04-17 (×3): 40 mg via ORAL
  Filled 2022-04-15 (×3): qty 1

## 2022-04-15 MED ORDER — FOLIC ACID 1 MG PO TABS
1.0000 mg | ORAL_TABLET | Freq: Every day | ORAL | Status: DC
  Administered 2022-04-15: 1 mg via ORAL
  Filled 2022-04-15: qty 1

## 2022-04-15 MED ORDER — NICOTINE 14 MG/24HR TD PT24
14.0000 mg | MEDICATED_PATCH | Freq: Every day | TRANSDERMAL | Status: DC
  Administered 2022-04-16: 14 mg via TRANSDERMAL
  Filled 2022-04-15 (×2): qty 1

## 2022-04-15 MED ORDER — LACTULOSE 10 GM/15ML PO SOLN
20.0000 g | Freq: Three times a day (TID) | ORAL | Status: DC
  Administered 2022-04-15 – 2022-04-16 (×6): 20 g via ORAL
  Filled 2022-04-15 (×6): qty 30

## 2022-04-15 MED ORDER — METRONIDAZOLE 500 MG/100ML IV SOLN
500.0000 mg | Freq: Once | INTRAVENOUS | Status: DC
  Administered 2022-04-15: 500 mg via INTRAVENOUS
  Filled 2022-04-15: qty 100

## 2022-04-15 MED ORDER — UMECLIDINIUM-VILANTEROL 62.5-25 MCG/ACT IN AEPB
1.0000 | INHALATION_SPRAY | Freq: Every day | RESPIRATORY_TRACT | Status: DC
  Administered 2022-04-16 – 2022-04-17 (×2): 1 via RESPIRATORY_TRACT
  Filled 2022-04-15: qty 14

## 2022-04-15 MED ORDER — VENLAFAXINE HCL ER 37.5 MG PO CP24
37.5000 mg | ORAL_CAPSULE | Freq: Every day | ORAL | Status: DC
  Administered 2022-04-16 – 2022-04-17 (×2): 37.5 mg via ORAL
  Filled 2022-04-15 (×2): qty 1

## 2022-04-15 MED ORDER — FENTANYL 75 MCG/HR TD PT72: 1.0000 | MEDICATED_PATCH | TRANSDERMAL | Status: DC

## 2022-04-15 MED ORDER — FUROSEMIDE 40 MG PO TABS: 40.0000 mg | ORAL_TABLET | Freq: Two times a day (BID) | ORAL | Status: DC

## 2022-04-15 MED ORDER — FERROUS SULFATE 325 (65 FE) MG PO TABS
325.0000 mg | ORAL_TABLET | ORAL | Status: DC
  Administered 2022-04-17: 325 mg via ORAL
  Filled 2022-04-15: qty 1

## 2022-04-15 MED ORDER — VANCOMYCIN HCL IN DEXTROSE 1-5 GM/200ML-% IV SOLN
1000.0000 mg | Freq: Once | INTRAVENOUS | Status: DC
  Filled 2022-04-15: qty 200

## 2022-04-15 MED ORDER — ARIPIPRAZOLE 5 MG PO TABS
5.0000 mg | ORAL_TABLET | Freq: Every day | ORAL | Status: DC
  Administered 2022-04-16 – 2022-04-17 (×2): 5 mg via ORAL
  Filled 2022-04-15 (×2): qty 1

## 2022-04-15 MED ORDER — SPIRONOLACTONE 25 MG PO TABS
100.0000 mg | ORAL_TABLET | Freq: Two times a day (BID) | ORAL | Status: DC
  Administered 2022-04-15 – 2022-04-17 (×4): 100 mg via ORAL
  Filled 2022-04-15 (×4): qty 4

## 2022-04-15 MED ORDER — OXYCODONE HCL 5 MG PO TABS
5.0000 mg | ORAL_TABLET | ORAL | Status: DC | PRN
  Administered 2022-04-15 – 2022-04-17 (×4): 5 mg via ORAL
  Filled 2022-04-15 (×4): qty 1

## 2022-04-15 MED ORDER — FUROSEMIDE 40 MG PO TABS
40.0000 mg | ORAL_TABLET | Freq: Two times a day (BID) | ORAL | Status: DC
  Administered 2022-04-16 – 2022-04-17 (×3): 40 mg via ORAL
  Filled 2022-04-15 (×3): qty 1

## 2022-04-15 MED ORDER — IPRATROPIUM-ALBUTEROL 0.5-2.5 (3) MG/3ML IN SOLN
3.0000 mL | Freq: Once | RESPIRATORY_TRACT | Status: AC
  Administered 2022-04-15: 3 mL via RESPIRATORY_TRACT
  Filled 2022-04-15: qty 3

## 2022-04-15 MED ORDER — FUROSEMIDE 10 MG/ML IJ SOLN
40.0000 mg | Freq: Two times a day (BID) | INTRAMUSCULAR | Status: DC
  Administered 2022-04-15: 40 mg via INTRAVENOUS
  Filled 2022-04-15: qty 4

## 2022-04-15 NOTE — Assessment & Plan Note (Signed)
Patient has a history of decompensated cirrhosis, currently on hospice.  AST is mildly elevated compared to baseline at this time.   --cont home lasix and aldactone

## 2022-04-15 NOTE — Assessment & Plan Note (Addendum)
On examination, patient has significant lower extremity edema, however it is likely in the setting of chronic venous stasis.  Given he received significant IV fluids in the ED, will give IV Lasix today.  We will need to clarify if patient is still taking his GDMT including losartan and metoprolol.  - Daily weights - Strict in and out - Lasix 40 mg IV once - Resume home Lasix tomorrow - Will need to clarify with wife of patient is still taking losartan and metoprolol

## 2022-04-15 NOTE — Assessment & Plan Note (Signed)
-   [facility] care informed that patient is currently hospitalized - Will need placement prior to discharge as patient's wife is unable to continue his care - PT/OT

## 2022-04-15 NOTE — Assessment & Plan Note (Signed)
Patient denies increased shortness of breath or sputum production.  He is only on a SABA and LAMA at this time.  Given prior history of exacerbations, pt was started on LABA-LAMA on admission Plan: --cont Anoro (new)

## 2022-04-15 NOTE — Consult Note (Signed)
PHARMACY -  BRIEF ANTIBIOTIC NOTE   Pharmacy has received consult(s) for Vancomycin and Cefepime from an ED provider.  The patient's profile has been reviewed for ht/wt/allergies/indication/available labs.    One time order(s) placed for Vancomycin 1g IV and Cefepime 2g IV x 1 dose each.  Further antibiotics/pharmacy consults should be ordered by admitting physician if indicated.                       Thank you, Pearla Dubonnet 04/15/2022  10:27 AM
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
## 2022-04-15 NOTE — ED Notes (Signed)
PIV started with [place]. Only able to collect 1 set [facility]. Edp aware.

## 2022-04-15 NOTE — Consult Note (Signed)
CODE SEPSIS - PHARMACY COMMUNICATION  **Broad Spectrum Antibiotics should be administered within 1 hour of Sepsis diagnosis**  Time Code Sepsis Called/Page Received: 1015  Antibiotics Ordered: vancomycin, cefepime, flagyl  Time of 1st antibiotic administration: 1123  Additional action taken by pharmacy: none  If necessary, Name of Provider/Nurse Contacted: n/a    Pearla Dubonnet ,PharmD Clinical Pharmacist  04/15/2022  1:10 PM

## 2022-04-15 NOTE — Assessment & Plan Note (Addendum)
-   cont home fentanyl patch and oxycodone

## 2022-04-15 NOTE — Progress Notes (Signed)
[facility] ED 3 [facility])       This patient is a current hospice patient with [facility], admitted with a terminal diagnosis of [place] Cirrhosis.     [facility] will continue to follow for any discharge planning needs and to coordinate continuation of hospice care.   Please don't hesitate to call with any Hospice related questions or concerns.    Thank you for the opportunity to participate in this patient's care. Jhonnie Garner, Therapist, sports, BSN, [facility] Liaison [phone]

## 2022-04-15 NOTE — Assessment & Plan Note (Addendum)
presented with altered mental status with disorientation, impaired memory, impaired balance, and report of increased aggression at home in the setting of missed lactulose doses.  Ammonia 100. - cont lactulose 20 g 3 times daily --explained the importance of taking lactulose and how it works

## 2022-04-15 NOTE — ED Triage Notes (Signed)
Pt ems from home s/p unwitnessed fall. Pt a/o. Per report pt has had increasing confusion x 2 weeks and pt has stopped taking liver meds.

## 2022-04-15 NOTE — ED Provider Notes (Signed)
[name] Provider Note    Event Date/Time   First MD Initiated Contact with Patient 04/15/22 1008     (approximate)   History   Chief Complaint: Fall   HPI  [name] is a 66 y.o. male with a history of COPD, cirrhosis with ascites, heart failure, chronic pain who comes ED due to an unwitnessed fall.  Patient has had worsening confusion and agitation over the past 2 weeks, stopped taking his medications including lactulose.  Last night he had an unwitnessed fall at home, but got himself up and was ambulatory immediately afterward.  Patient denies any pain currently.  Denies any acute complaints.     Physical Exam   Triage Vital Signs: ED Triage Vitals [04/15/22 1014]  Enc Vitals Group     BP      Pulse      Resp      Temp 97.6 F (36.4 C)     Temp Source Oral     SpO2      Weight      Height      Head Circumference      Peak Flow      Pain Score      Pain Loc      Pain Edu?      Excl. in [place]?     Most recent vital signs: Vitals:   04/15/22 1014  Temp: 97.6 F (36.4 C)    General: Awake, no distress.  No evidence of trauma CV:  Good peripheral perfusion.  Regular rate and rhythm, symmetric radial pulses Resp:  Normal effort.  Clear to auscultation bilaterally Abd:  No distention.  Soft, nontender.   Other: 1+ pitting edema bilateral lower extremities, symmetric.  No calf tenderness.  There is erythema warmth and tenderness of the right lower leg concerning for cellulitis, no open wounds, no crepitus.    ED Results / Procedures / Treatments   Labs (all labs ordered are listed, but only abnormal results are displayed) Labs Reviewed  LACTIC ACID, PLASMA - Abnormal; Notable for the following components:      Result Value   Lactic Acid, Venous 2.7 (*)    All other components within normal limits  COMPREHENSIVE METABOLIC PANEL - Abnormal; Notable for the following components:   Sodium 134 (*)    Glucose, Bld 120 (*)     Calcium 8.4 (*)    Albumin 2.5 (*)    AST 49 (*)    Total Bilirubin 2.6 (*)    Anion gap 4 (*)    All other components within normal limits  CBC WITH DIFFERENTIAL/PLATELET - Abnormal; Notable for the following components:   RBC 3.02 (*)    Hemoglobin 9.8 (*)    HCT 30.0 (*)    RDW 17.1 (*)    Platelets 34 (*)    All other components within normal limits  AMMONIA - Abnormal; Notable for the following components:   Ammonia 100 (*)    All other components within normal limits  BRAIN NATRIURETIC PEPTIDE - Abnormal; Notable for the following components:   B Natriuretic Peptide 101.4 (*)    All other components within normal limits  RESP PANEL BY RT-PCR (FLU A&B, COVID) ARPGX2  CULTURE, BLOOD (ROUTINE X 2)  CULTURE, BLOOD (ROUTINE X 2)  URINE CULTURE  APTT  LIPASE, BLOOD  PROCALCITONIN  URINALYSIS, COMPLETE (UACMP) WITH MICROSCOPIC  LACTIC ACID, PLASMA  PROTIME-INR     EKG Interpreted by me Sinus rhythm,  rate of 97.  Normal axis, slightly prolonged QTc of 502 ms.  Left bundle branch block.  No acute ischemic changes   RADIOLOGY Chest x-ray interpreted by me, negative for consolidation, effusion, or frank edema.  Radiology report reviewed  CT head unremarkable   PROCEDURES:  .Critical Care  Performed by: Carrie Mew, MD Authorized by: Carrie Mew, MD   Critical care provider statement:    Critical care time (minutes):  35   Critical care time was exclusive of:  Separately billable procedures and treating other patients   Critical care was necessary to treat or prevent imminent or life-threatening deterioration of the following conditions:  Sepsis, CNS failure or compromise and shock   Critical care was time spent personally by me on the following activities:  Development of treatment plan with patient or surrogate, discussions with consultants, evaluation of patient's response to treatment, examination of patient, obtaining history from patient or surrogate,  ordering and performing treatments and interventions, ordering and review of laboratory studies, ordering and review of radiographic studies, pulse oximetry, re-evaluation of patient's condition and review of old charts   Care discussed with: admitting provider      MEDICATIONS ORDERED IN ED: Medications  sodium chloride 0.9 % bolus 2,250 mL (1,000 mLs Intravenous New Bag/Given 04/15/22 1121)  metroNIDAZOLE (FLAGYL) IVPB 500 mg (500 mg Intravenous New Bag/Given 04/15/22 1204)  vancomycin (VANCOCIN) IVPB 1000 mg/200 mL premix (has no administration in time range)  ceFEPIme (MAXIPIME) 2 g in sodium chloride 0.9 % 100 mL IVPB (0 g Intravenous Stopped 04/15/22 1205)     IMPRESSION / MDM / [place] / ED COURSE  I reviewed the triage vital signs and the nursing notes.                              Differential diagnosis includes, but is not limited to, hepatic encephalopathy, dehydration, AKI, electrolyte abnormality, anemia, pneumonia, pleural effusion, UTI, viral illness  Patient's presentation is most consistent with acute presentation with potential threat to life or bodily function.  Patient brought to the ED with confusion and agitation.  Not compliant with his medications at home including lactulose.  No signs of trauma, but does seem to have cellulitis of the right lower leg.  He initially had tachycardia and first blood pressure of about 80/50 concerning for septic shock.  Broad-spectrum antibiotics were initiated with cefepime Flagyl vancomycin, ideal body weight based fluid bolus initiated.  Work-up significant for a lactate of 2.7 and ammonia level of 100.  Other labs essentially unremarkable other than stigmata of liver failure.  Case discussed with hospitalist for further management.       FINAL CLINICAL IMPRESSION(S) / ED DIAGNOSES   Final diagnoses:  Hepatic encephalopathy ([place])  Fall in home, initial encounter  Morbid obesity ([place])  Cellulitis of leg, right      Rx / DC Orders   ED Discharge Orders     None        Note:  This document was prepared using Dragon voice recognition software and may include unintentional dictation errors.   Carrie Mew, MD 04/15/22 1341

## 2022-04-15 NOTE — Assessment & Plan Note (Addendum)
Marked thrombocytopenia consistent with prior history.  Plt 30's.  No bleeding on examination.  2/2 cirrhosis

## 2022-04-15 NOTE — Sepsis Progress Note (Signed)
Sepsis protocol is being followed by [facility]. 

## 2022-04-15 NOTE — Progress Notes (Signed)
Patient arrived to room 103 via stretcher from ED.  Patient transferred to bed with supervision, oriented to room and unit with verbal understanding.

## 2022-04-16 DIAGNOSIS — Z801 Family history of malignant neoplasm of trachea, bronchus and lung: Secondary | ICD-10-CM | POA: Diagnosis not present

## 2022-04-16 DIAGNOSIS — Z515 Encounter for palliative care: Secondary | ICD-10-CM | POA: Diagnosis not present

## 2022-04-16 DIAGNOSIS — J449 Chronic obstructive pulmonary disease, unspecified: Secondary | ICD-10-CM | POA: Diagnosis present

## 2022-04-16 DIAGNOSIS — W1830XA Fall on same level, unspecified, initial encounter: Secondary | ICD-10-CM | POA: Diagnosis present

## 2022-04-16 DIAGNOSIS — F17210 Nicotine dependence, cigarettes, uncomplicated: Secondary | ICD-10-CM | POA: Diagnosis present

## 2022-04-16 DIAGNOSIS — K7581 Nonalcoholic steatohepatitis (NASH): Secondary | ICD-10-CM | POA: Diagnosis present

## 2022-04-16 DIAGNOSIS — Z1152 Encounter for screening for COVID-19: Secondary | ICD-10-CM | POA: Diagnosis not present

## 2022-04-16 DIAGNOSIS — K7460 Unspecified cirrhosis of liver: Secondary | ICD-10-CM | POA: Diagnosis present

## 2022-04-16 DIAGNOSIS — K7682 Hepatic encephalopathy: Secondary | ICD-10-CM | POA: Diagnosis present

## 2022-04-16 DIAGNOSIS — I255 Ischemic cardiomyopathy: Secondary | ICD-10-CM | POA: Diagnosis present

## 2022-04-16 DIAGNOSIS — I878 Other specified disorders of veins: Secondary | ICD-10-CM | POA: Diagnosis present

## 2022-04-16 DIAGNOSIS — I252 Old myocardial infarction: Secondary | ICD-10-CM | POA: Diagnosis not present

## 2022-04-16 DIAGNOSIS — I5042 Chronic combined systolic (congestive) and diastolic (congestive) heart failure: Secondary | ICD-10-CM | POA: Diagnosis present

## 2022-04-16 DIAGNOSIS — R188 Other ascites: Secondary | ICD-10-CM | POA: Diagnosis present

## 2022-04-16 DIAGNOSIS — I2510 Atherosclerotic heart disease of native coronary artery without angina pectoris: Secondary | ICD-10-CM | POA: Diagnosis present

## 2022-04-16 DIAGNOSIS — L03115 Cellulitis of right lower limb: Secondary | ICD-10-CM | POA: Diagnosis present

## 2022-04-16 DIAGNOSIS — I110 Hypertensive heart disease with heart failure: Secondary | ICD-10-CM | POA: Diagnosis present

## 2022-04-16 DIAGNOSIS — E119 Type 2 diabetes mellitus without complications: Secondary | ICD-10-CM | POA: Diagnosis present

## 2022-04-16 DIAGNOSIS — E785 Hyperlipidemia, unspecified: Secondary | ICD-10-CM | POA: Diagnosis present

## 2022-04-16 DIAGNOSIS — Y92009 Unspecified place in unspecified non-institutional (private) residence as the place of occurrence of the external cause: Secondary | ICD-10-CM | POA: Diagnosis not present

## 2022-04-16 DIAGNOSIS — G894 Chronic pain syndrome: Secondary | ICD-10-CM | POA: Diagnosis present

## 2022-04-16 DIAGNOSIS — D696 Thrombocytopenia, unspecified: Secondary | ICD-10-CM | POA: Diagnosis present

## 2022-04-16 DIAGNOSIS — Z8261 Family history of arthritis: Secondary | ICD-10-CM | POA: Diagnosis not present

## 2022-04-16 DIAGNOSIS — T473X6A Underdosing of saline and osmotic laxatives, initial encounter: Secondary | ICD-10-CM | POA: Diagnosis present

## 2022-04-16 DIAGNOSIS — Z8249 Family history of ischemic heart disease and other diseases of the circulatory system: Secondary | ICD-10-CM | POA: Diagnosis not present

## 2022-04-16 LAB — CBC WITH DIFFERENTIAL/PLATELET
Abs Immature Granulocytes: 0.01 10*3/uL (ref 0.00–0.07)
Basophils Absolute: 0 10*3/uL (ref 0.0–0.1)
Basophils Relative: 0 %
Eosinophils Absolute: 0.2 10*3/uL (ref 0.0–0.5)
Eosinophils Relative: 4 %
HCT: 28.4 % — ABNORMAL LOW (ref 39.0–52.0)
Hemoglobin: 9.9 g/dL — ABNORMAL LOW (ref 13.0–17.0)
Immature Granulocytes: 0 %
Lymphocytes Relative: 17 %
Lymphs Abs: 0.9 10*3/uL (ref 0.7–4.0)
MCH: 34.1 pg — ABNORMAL HIGH (ref 26.0–34.0)
MCHC: 34.9 g/dL (ref 30.0–36.0)
MCV: 97.9 fL (ref 80.0–100.0)
Monocytes Absolute: 0.6 10*3/uL (ref 0.1–1.0)
Monocytes Relative: 13 %
Neutro Abs: 3.3 10*3/uL (ref 1.7–7.7)
Neutrophils Relative %: 66 %
Platelets: 35 10*3/uL — ABNORMAL LOW (ref 150–400)
RBC: 2.9 MIL/uL — ABNORMAL LOW (ref 4.22–5.81)
RDW: 16.8 % — ABNORMAL HIGH (ref 11.5–15.5)
WBC: 5 10*3/uL (ref 4.0–10.5)
nRBC: 0 % (ref 0.0–0.2)

## 2022-04-16 LAB — COMPREHENSIVE METABOLIC PANEL
ALT: 22 U/L (ref 0–44)
AST: 51 U/L — ABNORMAL HIGH (ref 15–41)
Albumin: 2.3 g/dL — ABNORMAL LOW (ref 3.5–5.0)
Alkaline Phosphatase: 110 U/L (ref 38–126)
Anion gap: 4 — ABNORMAL LOW (ref 5–15)
BUN: 13 mg/dL (ref 8–23)
CO2: 25 mmol/L (ref 22–32)
Calcium: 7.7 mg/dL — ABNORMAL LOW (ref 8.9–10.3)
Chloride: 105 mmol/L (ref 98–111)
Creatinine, Ser: 0.79 mg/dL (ref 0.61–1.24)
GFR, Estimated: >60 mL/min (ref >60)
Glucose, Bld: 77 mg/dL (ref 70–99)
Potassium: 3.8 mmol/L (ref 3.5–5.1)
Sodium: 134 mmol/L — ABNORMAL LOW (ref 135–145)
Total Bilirubin: 2.6 mg/dL — ABNORMAL HIGH (ref 0.3–1.2)
Total Protein: 6.9 g/dL (ref 6.5–8.1)

## 2022-04-16 LAB — URINE CULTURE: Culture: NO GROWTH

## 2022-04-16 LAB — PROTIME-INR
INR: 1.5 — ABNORMAL HIGH (ref 0.8–1.2)
Prothrombin Time: 17.6 seconds — ABNORMAL HIGH (ref 11.4–15.2)

## 2022-04-16 MED ORDER — METOPROLOL SUCCINATE ER 25 MG PO TB24: 25.0000 mg | ORAL_TABLET | Freq: Every day | ORAL | Status: DC

## 2022-04-16 NOTE — Progress Notes (Signed)
  PROGRESS NOTE    [name]  VVZ:[id number] DOB: 14-Apr-1956 DOA: 04/15/2022 PCP: Ria Bush, MD  103A/103A-AA  LOS: 0 days   Brief hospital course:   Assessment & Plan: [name] is a 66 y.o. male with medical history significant of decompensated cirrhosis 2/2 NASH on Hospice, chronic pain syndrome 2/2 DDD and spinal stenosis on Fentanyl and Oxycodone, CAD s/p DES, HFrEF with EF of 35-40%, COPD, hypertension, hyperlipidemia, type 2 diabetes, who presents to the ED after ground-level fall.  Pt's wife reported pt hadn't been taking his lactulose, which pt confirmed.   * Hepatic encephalopathy ([place]) presented with altered mental status with disorientation, impaired memory, impaired balance, and report of increased aggression at home in the setting of missed lactulose doses.  Ammonia 100. - cont lactulose 20 g 3 times daily --explained the importance of taking lactulose and how it works  Cirrhosis of liver with ascites ([place]) Patient has a history of decompensated cirrhosis, currently on hospice.  AST is mildly elevated compared to baseline at this time.   --cont home lasix and aldactone  Chronic combined systolic and diastolic CHF (congestive heart failure) (HCC) --not in exacerbation --cont home lasix and aldactone --resume home Toporol  Hospice care patient - [facility] care informed that patient is currently hospitalized  COPD (chronic obstructive pulmonary disease) ([place]) Patient denies increased shortness of breath or sputum production.  He is only on a SABA and LAMA at this time.  Given prior history of exacerbations, pt was started on LABA-LAMA on admission Plan: --cont Anoro (new)  Thrombocytopenia (HCC) Marked thrombocytopenia consistent with prior history.  Plt 30's.  No bleeding on examination.  2/2 cirrhosis  Chronic pain syndrome - cont home fentanyl patch and oxycodone   DVT prophylaxis: SCD/Compression stockings Code Status: Full code   Family Communication:  Level of care: Med-Surg Dispo:   The patient is from: home Anticipated d/c is to: home Anticipated d/c date is: tomorrow   Subjective and Interval History:  Pt had no specific complaints.  Pt said he had BM's at home even without taking lactulose.  I explained to pt why he still needs to take lactulose.   Objective: Vitals:   04/16/22 0041 04/16/22 0425 04/16/22 0852 04/16/22 1605  BP: (!) 93/57 108/65 (!) 101/46 (!) 114/45  Pulse: 100 96 93 (!) 101  Resp: 19 20 17 17   Temp: 98.8 F (37.1 C) 99.1 F (37.3 C) 98.6 F (37 C) 98.9 F (37.2 C)  TempSrc:      SpO2: 96% 97% 92% 95%  Weight:      Height:        Intake/Output Summary (Last 24 hours) at 04/16/2022 1820 Last data filed at 04/16/2022 1414 Gross per 24 hour  Intake 720 ml  Output 450 ml  Net 270 ml   Filed Weights   04/15/22 1016  Weight: (!) 137.6 kg    Examination:   Constitutional: NAD, AAOx3, sitting in recliner HEENT: conjunctivae and lids normal, EOMI CV: No cyanosis.   RESP: normal respiratory effort, on RA Neuro: II - XII grossly intact.   Psych: Normal mood and affect.  Appropriate judgement and reason   Data Reviewed: I have personally reviewed labs and imaging studies  Time spent: 50 minutes  Enzo Bi, MD [facility] If 7PM-7AM, please contact night-coverage 04/16/2022, 6:20 PM

## 2022-04-16 NOTE — Evaluation (Signed)
Occupational Therapy Evaluation Patient Details Name: [name] MRN: [id number] DOB: 10-May-1956 Today's Date: 04/16/2022   History of Present Illness Pt is a 66 y/o M admitted on 04/15/22 after presenting with c/o ground-level fall. Pt is being treated for hepatic encephalopathy. PMH: decompensated cirrhosis 2/2 NASH on Hospice, chronic pain syndrome 2/2 DDD, spinal stenosis, CAD s/p DES, HFrEF with EF 35-40%, COPD, HTN, HLD, DM2, AAA, Hep B, MI,   Clinical Impression   Patient seen for OT evaluation. Pt presenting with decreased independence in self care, balance, functional mobility/transfers, and endurance. Prior to admission, pt reports being independent with ADLs and Mod I for functional mobility in the home using a rollator. Pt lives with wife who works during the day and assists with IADLs medication management, transportation, groceries). Pt currently functioning at Mod I for bed mobility, Mod I for functional transfers, and supervision to take steps toward recliner using RW. Pt required set up A for UB dressing and Mod A for LB dressing in sitting. Pt requiring increased assistance at this time for LB ADLs and would benefit from further instruction in AE (I.e. reacher, sock aid). Pt will benefit from acute OT to increase overall independence in the areas of ADLs and functional mobility in order to safely discharge home. Pt could benefit from [facility] following D/C to decrease falls risk, improve balance, and maximize independence in self-care within own home environment.    Recommendations for follow up therapy are one component of a multi-disciplinary discharge planning process, led by the attending physician.  Recommendations may be updated based on patient status, additional functional criteria and insurance authorization.   Follow Up Recommendations  Home health OT    Assistance Recommended at Discharge Intermittent Supervision/Assistance  Patient can return home with the  following A little help with bathing/dressing/bathroom;Assistance with cooking/housework;Assist for transportation;Help with stairs or ramp for entrance    Functional Status Assessment  Patient has had a recent decline in their functional status and demonstrates the ability to make significant improvements in function in a reasonable and predictable amount of time.  Equipment Recommendations  Other (comment) Management consultant)    Recommendations for Other Services       Precautions / Restrictions Precautions Precautions: Fall Restrictions Weight Bearing Restrictions: No      Mobility Bed Mobility Overal bed mobility: Modified Independent             General bed mobility comments: supine>sit with HOB elevated & use of bed rails    Transfers Overall transfer level: Modified independent Equipment used: Rolling walker (2 wheels)               General transfer comment: STS from EOB      Balance Overall balance assessment: Mild deficits observed, not formally tested Sitting-balance support: No upper extremity supported, Feet supported Sitting balance-Leahy Scale: Good     Standing balance support: Reliant on assistive device for balance, During functional activity Standing balance-Leahy Scale: Good                             ADL either performed or assessed with clinical judgement   ADL Overall ADL's : Needs assistance/impaired Eating/Feeding: Set up;Sitting               Upper Body Dressing : Set up;Sitting Upper Body Dressing Details (indicate cue type and reason): to don/doff gown Lower Body Dressing: Moderate assistance;Sitting/lateral leans Lower Body Dressing Details (indicate cue type and  reason): pt putting shorts on backwards at first, assist for threading over feet (could get one side but not the other), assist for donning socks (difficulty reaching feet) Toilet Transfer: Modified Independent;Rolling walker (2 wheels) Toilet Transfer  Details (indicate cue type and reason): simulated with STS from EOB         Functional mobility during ADLs: Supervision/safety;Rolling walker (2 wheels) (to take steps toward recliner)       Vision Patient Visual Report: No change from baseline       Perception     Praxis      Pertinent Vitals/Pain Pain Assessment Pain Assessment: No/denies pain     Hand Dominance     Extremity/Trunk Assessment Upper Extremity Assessment Upper Extremity Assessment: Overall WFL for tasks assessed   Lower Extremity Assessment Lower Extremity Assessment: Generalized weakness       Communication Communication Communication: No difficulties   Cognition Arousal/Alertness: Awake/alert Behavior During Therapy: WFL for tasks assessed/performed Overall Cognitive Status: Within Functional Limits for tasks assessed                                       General Comments     Exercises Other Exercises Other Exercises: OT provided education re: role of OT, OT POC, post acute recs, sitting up for all meals, EOB/OOB mobility with assistance, home/fall safety.     Shoulder Instructions      Home Living Family/patient expects to be discharged to:: Private residence Living Arrangements: Spouse/significant other Available Help at Discharge: Family;Available PRN/intermittently (wife works during the day) Type of Home: Mobile home Home Access: Stairs to enter [employer] of Steps: 4 Entrance Stairs-Rails: Right Home Layout: One level     Bathroom Shower/Tub: Teacher, early years/pre: [place]: Rollator (4 wheels);Cane - single point;Shower seat;Grab bars - tub/shower          Prior Functioning/Environment Prior Level of Function : Independent/Modified Independent             Mobility Comments: Pt reports he's ambulatory with rollator in the home, denies falls, walks household distances ADLs Comments: Pt reports independent  with ADLs. Wife assists with medication management, transportation, and picks up groceries. Pt reports being able to assist with cooking/cleaning.        OT Problem List: Decreased strength;Decreased activity tolerance;Impaired balance (sitting and/or standing);Obesity      OT Treatment/Interventions: Self-care/ADL training;Therapeutic exercise;Therapeutic activities;DME and/or AE instruction;Patient/family education;Balance training    OT Goals(Current goals can be found in the care plan section) Acute Rehab OT Goals Patient Stated Goal: go home OT Goal Formulation: With patient Time For Goal Achievement: 04/30/22 Potential to Achieve Goals: Good   OT Frequency: Min 2X/week    Co-evaluation              AM-PAC OT "6 Clicks" Daily Activity     Outcome Measure Help from another person eating meals?: None Help from another person taking care of personal grooming?: A Little Help from another person toileting, which includes using toliet, bedpan, or urinal?: A Little Help from another person bathing (including washing, rinsing, drying)?: A Lot Help from another person to put on and taking off regular upper body clothing?: None Help from another person to put on and taking off regular lower body clothing?: A Lot 6 Click Score: 18   End of Session Equipment Utilized During Treatment: Rolling  walker (2 wheels) Nurse Communication: Mobility status  Activity Tolerance: Patient tolerated treatment well Patient left: in chair;with call bell/phone within reach;with chair alarm set  OT Visit Diagnosis: Muscle weakness (generalized) (M62.81)                Time: 3882-6666 OT Time Calculation (min): 20 min Charges:  OT General Charges $OT Visit: 1 Visit OT Evaluation $OT Eval Low Complexity: 1 Low  [facility] MS, OTR/L ascom [phone]  04/16/22, 1:40 PM

## 2022-04-16 NOTE — Plan of Care (Signed)
  Problem: Education: Goal: Knowledge of General Education information will improve Description Including pain rating scale, medication(s)/side effects and non-pharmacologic comfort measures Outcome: Progressing   Problem: Health Behavior/Discharge Planning: Goal: Ability to manage health-related needs will improve Outcome: Progressing   

## 2022-04-16 NOTE — Evaluation (Signed)
Physical Therapy Evaluation Patient Details Name: [name] MRN: [id number] DOB: 07-17-1955 Today's Date: 04/16/2022  History of Present Illness  Pt is a 66 y/o M admitted on 04/15/22 after presenting with c/o ground-level fall. Pt is being treated for hepatic encephalopathy. PMH: decompensated cirrhosis 2/2 NASH on Hospice, chronic pain syndrome 2/2 DDD, spinal stenosis, CAD s/p DES, HFrEF with EF 35-40%, COPD, HTN, HLD, DM2, AAA, Hep B, MI,  Clinical Impression  Pt seen for PT evaluation with pt received asleep but agreeable to tx. Pt reports prior to admission he was residing with his wife who works during the day, ambulated with rollator, and denies falls. On this date, pt is mod I for bed mobility, mod I for STS from various surfaces, and ambulates in room with RW & distant supervision. Based on pt's report pt appears to be fairly close to his baseline and do not anticipate pt will require f/u therapy. Will continue to follow pt acutely to address endurance, activity tolerance, gait & stairs.       Recommendations for follow up therapy are one component of a multi-disciplinary discharge planning process, led by the attending physician.  Recommendations may be updated based on patient status, additional functional criteria and insurance authorization.  Follow Up Recommendations No PT follow up      Assistance Recommended at Discharge Intermittent Supervision/Assistance  Patient can return home with the following  Assistance with cooking/housework;Assist for transportation;Help with stairs or ramp for entrance    Equipment Recommendations None recommended by PT  Recommendations for Other Services       Functional Status Assessment Patient has had a recent decline in their functional status and demonstrates the ability to make significant improvements in function in a reasonable and predictable amount of time.     Precautions / Restrictions Precautions Precautions:  Fall Restrictions Weight Bearing Restrictions: No      Mobility  Bed Mobility Overal bed mobility: Modified Independent             General bed mobility comments: supine>sit with HOB elevated & use of bed rails but no assistance    Transfers Overall transfer level: Modified independent Equipment used: Rolling walker (2 wheels)               General transfer comment: STS from EOB & toilet in bathroom    Ambulation/Gait Ambulation/Gait assistance: Supervision (distant supervision) Gait Distance (Feet): 20 Feet (+ 30 ft) Assistive device: Rolling walker (2 wheels) Gait Pattern/deviations: Decreased step length - right, Decreased step length - left, Decreased stride length Gait velocity: slightly decreased     General Gait Details: Requires min cuing for safety to allow PT to move bed to provide more space to walk to bathroom.  Stairs            Wheelchair Mobility    Modified Rankin (Stroke Patients Only)       Balance Overall balance assessment: Mild deficits observed, not formally tested Sitting-balance support: No upper extremity supported, Feet supported Sitting balance-Leahy Scale: Good     Standing balance support: Reliant on assistive device for balance, During functional activity Standing balance-Leahy Scale: Good                               Pertinent Vitals/Pain Pain Assessment Pain Assessment: No/denies pain    Home Living Family/patient expects to be discharged to:: Private residence Living Arrangements: Spouse/significant other Available Help at Discharge: Family;Available  PRN/intermittently (wife works during the day) Type of Home: Mobile home Home Access: Stairs to enter Entrance Stairs-Rails: Right Entrance Stairs-Number of Steps: 4   Home Layout: One level Home Equipment: [place] (4 wheels);Cane - single point      Prior Function               Mobility Comments: Pt reports he's ambulatory with  rollator in the home, denies falls, walks household distances. His wife works during the day.       Hand Dominance        Extremity/Trunk Assessment   Upper Extremity Assessment Upper Extremity Assessment: Overall WFL for tasks assessed    Lower Extremity Assessment Lower Extremity Assessment: Generalized weakness       Communication   Communication: No difficulties  Cognition Arousal/Alertness: Awake/alert Behavior During Therapy: WFL for tasks assessed/performed Overall Cognitive Status: Within Functional Limits for tasks assessed                                          General Comments General comments (skin integrity, edema, etc.): Pt with continent void sitting on toilet.    Exercises Other Exercises Other Exercises: Pt performs 5x STS from recliner without BUE support with supervision. Pt attempts to provide cuing to scoot out to edge of seat but pt with poor understanding/ability to do so. Pt leans posteriorly on recliner with BLE. Overall task focused on BLE strengthening.   Assessment/Plan    PT Assessment Patient needs continued PT services  PT Problem List Decreased strength;Decreased activity tolerance;Decreased mobility       PT Treatment Interventions DME instruction;Therapeutic exercise;Gait training;Balance training;Stair training;Functional mobility training;Therapeutic activities;Patient/family education;Neuromuscular re-education    PT Goals (Current goals can be found in the Care Plan section)  Acute Rehab PT Goals Patient Stated Goal: none stated PT Goal Formulation: With patient Time For Goal Achievement: 04/30/22 Potential to Achieve Goals: Good    Frequency Min 2X/week     Co-evaluation               AM-PAC PT "6 Clicks" Mobility  Outcome Measure Help needed turning from your back to your side while in a flat bed without using bedrails?: A Little Help needed moving from lying on your back to sitting on the side  of a flat bed without using bedrails?: A Little Help needed moving to and from a bed to a chair (including a wheelchair)?: A Little Help needed standing up from a chair using your arms (e.g., wheelchair or bedside chair)?: None Help needed to walk in hospital room?: A Little Help needed climbing 3-5 steps with a railing? : A Little 6 Click Score: 19    End of Session   Activity Tolerance: Patient tolerated treatment well Patient left: in chair;with chair alarm set;with call bell/phone within reach Nurse Communication: Mobility status PT Visit Diagnosis: Muscle weakness (generalized) (M62.81)    Time: 4536-4680 PT Time Calculation (min) (ACUTE ONLY): 16 min   Charges:   PT Evaluation $PT Eval Low Complexity: [place], PT, DPT 04/16/22, 12:22 PM   Waunita Schooner 04/16/2022, 12:20 PM

## 2022-04-16 NOTE — Progress Notes (Signed)
[facility]) hospitalized hospice patient visit   [name] is current [facility] hospice patient with a terminal diagnosis of NASH Cirrhosis. Patient had a fall on Friday night along with several days of worsening mental status and refusing of medications. He was sent to the ED by wife after calling [facility] on Saturday morning 11.11.23. He is admitted to the hospital with a diagnosis of Hepatic Encephalopathy. Per Dr. Hollace Kinnier with [facility] this is a related diagnosis.   Visited with [name] at bedside. He reported to feeling better but appeared to be trying to get out of bed over top of the bed rails. He reports that he is hungry this morning. Assisted to help adjust in bed and instructed him not to try and get out of bed without assistance and assured he knew how to call for the nurse.   Patient is inpatient appropriate due to need for higher level monitoring with acute encephalopathy as well as IV fluid, diuretic and antibiotics   Vital Signs-  98.6/105/17   101/46    92% room air Intake/Output- 2039/450 Abnormal labs- Na+ 134, Ca+ 7.7, Anion Gab 4, Albumin 2.3, AST 51, Ammonia 100, Total Bilirubin 2.6, BNP 101.4, Lactic 2.4, RBC 2.9, Hgb 9.9, Hct 28.4, MCH 34.1, RDW 16.8, Platelets 35 Diagnostics-  CT HEAD WITHOUT CONTRAST  IMPRESSION: 1. No acute abnormality. 2. Stable mild diffuse cerebral and cerebellar atrophy and minimal chronic small vessel white matter ischemic changes in both cerebral hemispheres.  Electronically Signed   By: Claudie Revering M.D.   On: 04/15/2022 13:06  PORTABLE CHEST 1 VIEW  IMPRESSION: Enlargement of the cardiopericardial silhouette with pulmonary vascular congestion.  Electronically Signed   By: Misty Stanley M.D.   On: 04/15/2022 11:24  IV/PRN Meds- Lasix 43m IVx1, Cefepime 2g IV once, Flagyl 501mIVPB once, NS IV bolus 2,25031mVancomycin 1gram IV x1, Oxycodone 5mg31m x1  Problem List  [facility]  [place] with altered mental status with disorientation, impaired memory, impaired balance, and report of increased aggression at home in the setting of missed lactulose doses.   - Start lactulose 20 g 3 times daily - Further titrate to 2-3 bowel movements daily - If he remains encephalopathic at that time, consider addition of rifaximin   Cirrhosis of liver with ascites ([facility] has a history of decompensated cirrhosis, currently on hospice.  AST is mildly elevated compared to baseline at this time.     - Given IV fluids received, will give a dose of Lasix IV today - Restart home spironolactone - Restart home oral Lasix tomorrow - Hold on DVT prophylaxis due to severe thrombocytopenia   Combined congestive systolic and diastolic heart failure (HCC)[place] examination, patient has significant lower extremity edema, however it is likely in the setting of chronic venous stasis.  Given he received significant IV fluids in the ED, will give IV Lasix today.  We will need to clarify if patient is still taking his GDMT including losartan and metoprolol.   - Daily weights - Strict in and out - Lasix 40 mg IV once - Resume home Lasix tomorrow - Will need to clarify with wife of patient is still taking losartan and metoprolol   Hospice care patient - [facility] care informed that patient is currently hospitalized - Will need placement prior to discharge as patient's wife is unable to continue his care - PT/OT   COPD (chronic obstructive pulmonary disease) (HCC) No wheezing on  examination but increased rhonchi that is intermittent and improves with cough.  Patient denies increased shortness of breath or sputum production.  He is only on a SABA and LAMA at this time.  Given prior history of exacerbations, he would likely benefit from a LABA-LAMA.   - One-time DuoNeb - Continue continue home albuterol as needed - Start Anoro daily    Thrombocytopenia (HCC) Marked  thrombocytopenia consistent with prior history.  No bleeding on examination.   - CBC daily - Hold off on pharmacological DVT prophylaxis   Chronic pain syndrome - Restart home fentanyl patch and oxycodone   Discharge Planning- Ongoing Family Contact- Left message for wife [name] IDT- Updated Goals of care - Patient reiterated wish to remain full code however mentation was altered at this time.   Transfer summary and Medication list placed on shadow chart  Jhonnie Garner, RN, BSN, [facility] Liaison [phone]

## 2022-04-17 DIAGNOSIS — K7682 Hepatic encephalopathy: Secondary | ICD-10-CM | POA: Diagnosis not present

## 2022-04-17 LAB — CBC
HCT: 27.6 % — ABNORMAL LOW (ref 39.0–52.0)
Hemoglobin: 9.3 g/dL — ABNORMAL LOW (ref 13.0–17.0)
MCH: 32.9 pg (ref 26.0–34.0)
MCHC: 33.7 g/dL (ref 30.0–36.0)
MCV: 97.5 fL (ref 80.0–100.0)
Platelets: 34 10*3/uL — ABNORMAL LOW (ref 150–400)
RBC: 2.83 MIL/uL — ABNORMAL LOW (ref 4.22–5.81)
RDW: 16.6 % — ABNORMAL HIGH (ref 11.5–15.5)
WBC: 4.3 10*3/uL (ref 4.0–10.5)
nRBC: 0 % (ref 0.0–0.2)

## 2022-04-17 LAB — BASIC METABOLIC PANEL
Anion gap: 4 — ABNORMAL LOW (ref 5–15)
BUN: 12 mg/dL (ref 8–23)
CO2: 28 mmol/L (ref 22–32)
Calcium: 8.4 mg/dL — ABNORMAL LOW (ref 8.9–10.3)
Chloride: 101 mmol/L (ref 98–111)
Creatinine, Ser: 0.99 mg/dL (ref 0.61–1.24)
GFR, Estimated: >60 mL/min (ref >60)
Glucose, Bld: 121 mg/dL — ABNORMAL HIGH (ref 70–99)
Potassium: 4 mmol/L (ref 3.5–5.1)
Sodium: 133 mmol/L — ABNORMAL LOW (ref 135–145)

## 2022-04-17 LAB — LACTIC ACID, PLASMA: Lactic Acid, Venous: 1.2 mmol/L (ref 0.5–1.9)

## 2022-04-17 LAB — MAGNESIUM: Magnesium: 2.1 mg/dL (ref 1.7–2.4)

## 2022-04-17 LAB — AMMONIA: Ammonia: 112 umol/L — ABNORMAL HIGH (ref 9–35)

## 2022-04-17 MED ORDER — LACTULOSE 20 GM/30ML PO SOLN: 20.0000 g | Freq: Three times a day (TID) | ORAL | 2 refills | Status: DC

## 2022-04-17 MED ORDER — LACTULOSE 10 GM/15ML PO SOLN
30.0000 g | Freq: Three times a day (TID) | ORAL | Status: DC
  Administered 2022-04-17 (×2): 30 g via ORAL
  Filled 2022-04-17 (×2): qty 60

## 2022-04-17 NOTE — Discharge Summary (Signed)
Physician Discharge Summary   [name]  male DOB: [birth year]  DQQ:[id number]  PCP: Ria Bush, MD  Admit date: 04/15/2022 Discharge date: 04/17/2022  Admitted From: home Disposition:  home CODE STATUS: Full code  Discharge Instructions     Diet - low sodium heart healthy   Complete by: As directed    Discharge instructions   Complete by: As directed    As we discussed, even if you have bowel movements, you still need to take lactulose every day as prescribed.  If your ammonia level gets too high, you may go into a coma.   Dr. Enzo Bi [facility] Course:  For full details, please see H&P, progress notes, consult notes and ancillary notes.  Briefly,  [name] is a 66 y.o. male with medical history significant of decompensated cirrhosis 2/2 NASH on Hospice, chronic pain syndrome 2/2 DDD and spinal stenosis on Fentanyl and Oxycodone, CAD s/p DES, HFrEF with EF of 35-40%, COPD, hypertension, type 2 diabetes, who presented to the ED after ground-level fall.  Pt's wife reported pt hadn't been taking his lactulose, which pt admitted.   * Hepatic encephalopathy ([place]) presented with altered mental status with disorientation, impaired memory, impaired balance, and report of increased aggression at home in the setting of missed lactulose doses.  Ammonia 100.  Pt thought that he didn't need lactulose because he was having daily BM's without lactulose.  I explained the importance of taking lactulose and how it works -cont lactulose 20 g 3 times daily --mental status back to baseline prior to discharge.  Cirrhosis of liver with ascites [facility]) Patient has a history of decompensated cirrhosis, currently on hospice.  AST is mildly elevated compared to baseline at this time.   --cont home lasix and aldactone   Chronic combined systolic and diastolic CHF (congestive heart failure) (HCC) --not in exacerbation --cont home lasix and aldactone --not taking  Toprol PTA, unsure whether taking losartan PTA.     Hospice care patient -with [facility]    COPD (chronic obstructive pulmonary disease) ([place]) Patient denies increased shortness of breath or sputum production.   --cont home bronchodilators   Thrombocytopenia (HCC) Marked thrombocytopenia consistent with prior history.  Plt 30's.  No bleeding on examination.  2/2 cirrhosis   Chronic pain syndrome - cont home fentanyl patch and oxycodone   Discharge Diagnoses:  Principal Problem:   Hepatic encephalopathy ([place]) Active Problems:   Cirrhosis of liver with ascites (HCC)   Chronic combined systolic and diastolic CHF (congestive heart failure) ([place])   Hospice care patient   COPD (chronic obstructive pulmonary disease) (HCC)   Thrombocytopenia (HCC)   Chronic pain syndrome   30 Day Unplanned Readmission Risk Score    Flowsheet Row ED to Hosp-Admission (Current) from 04/15/2022 in [place]  30 Day Unplanned Readmission Risk Score (%) 21.67 Filed at 04/17/2022 0401       This score is the patient's risk of an unplanned readmission within 30 days of being discharged (0 -100%). The score is based on dignosis, age, lab data, medications, orders, and past utilization.   Low:  0-14.9   Medium: 15-21.9   High: 22-29.9   Extreme: 30 and above         Discharge Instructions:  Allergies as of 04/17/2022       Reactions   Statins Shortness Of Breath   Cough, trouble breathing Cough, trouble breathing   Losartan Other (  See Comments)   Causes him to have pain   Wellbutrin [bupropion] Other (See Comments)   Worsened mood - crying   Allopurinol Nausea Only   Baclofen Nausea And Vomiting   Penicillins Nausea And Vomiting   Did it involve swelling of the face/tongue/throat, SOB, or low BP? N/A Did it involve sudden or severe rash/hives, skin peeling, or any reaction on the inside of your mouth or nose? N/A Did you need to seek medical  attention at a hospital or doctor's office? N/A When did it last happen? Child     If all above answers are "NO", may proceed with cephalosporin use.   Tramadol Nausea Only        Medication List     STOP taking these medications    acetaminophen 500 MG tablet Commonly known as: TYLENOL   cephALEXin 500 MG capsule Commonly known as: KEFLEX   losartan 25 MG tablet Commonly known as: COZAAR   metoprolol succinate 25 MG 24 hr tablet Commonly known as: TOPROL-XL   temazepam 15 MG capsule Commonly known as: RESTORIL       TAKE these medications    albuterol (2.5 MG/3ML) 0.083% nebulizer solution Commonly known as: PROVENTIL USE 1 VIAL PER NEBULIZER EVERY 6 HRS AS NEEDED FOR WHEEZING   albuterol 108 (90 Base) MCG/ACT inhaler Commonly known as: VENTOLIN HFA TAKE 2 PUFFS BY MOUTH EVERY 6 HOURS AS NEEDED FOR WHEEZE OR SHORTNESS OF BREATH   ARIPiprazole 5 MG tablet Commonly known as: ABILIFY Take 1 tablet (5 mg total) by mouth daily.   Ca Phosphate-Cholecalciferol [phone] MG-UNIT Tabs Take 1 tablet by mouth daily.   cyanocobalamin 500 MCG tablet Commonly known as: VITAMIN B12 Take 1 tablet (500 mcg total) by mouth every Monday, Wednesday, and Friday.   desvenlafaxine 25 MG 24 hr tablet Commonly known as: PRISTIQ TAKE 1 TABLET (25 MG TOTAL) BY MOUTH DAILY.   fentaNYL 75 MCG/HR Commonly known as: [place] 1 patch onto the skin every 3 (three) days. What changed: additional instructions   ferrous sulfate 325 (65 FE) MG tablet Take 1 tablet (325 mg total) by mouth every other day.   fluticasone 50 MCG/ACT nasal spray Commonly known as: FLONASE Place 2 sprays into both nostrils daily as needed for allergies.   folic acid 1 MG tablet Commonly known as: FOLVITE TAKE 1 TABLET BY MOUTH EVERY DAY   furosemide 40 MG tablet Commonly known as: LASIX Take 1 tablet (40 mg total) by mouth 2 (two) times daily.   gabapentin 300 MG capsule Commonly known as:  NEURONTIN Take 1 capsule (300 mg total) by mouth in the morning and at bedtime.   Lactulose 20 GM/30ML Soln Take 30 mLs (20 g total) by mouth 3 (three) times daily.   nicotine 14 mg/24hr patch Commonly known as: NICODERM CQ - dosed in mg/24 hours APPLY 1 PATCH ONTO THE SKIN EVERY DAY   nitroGLYCERIN 0.4 MG SL tablet Commonly known as: NITROSTAT Place 1 tablet (0.4 mg total) under the tongue every 5 (five) minutes as needed for chest pain.   nystatin cream Commonly known as: MYCOSTATIN APPLY TO AFFECTED AREA TWICE A DAY   omeprazole 40 MG capsule Commonly known as: PRILOSEC TAKE 1 CAPSULE BY MOUTH EVERY DAY   ondansetron 4 MG tablet Commonly known as: ZOFRAN TAKE 1 TABLET BY MOUTH EVERY 8 HOURS AS NEEDED FOR NAUSEA AND VOMITING   oxyCODONE 5 MG immediate release tablet Commonly known as: Oxy IR/ROXICODONE Take 1 tablet (5  mg total) by mouth every 4 (four) hours as needed for severe pain.   Red Yeast Rice 600 MG Caps Take 2 capsules by mouth daily.   Spiriva Respimat 2.5 MCG/ACT Aers Generic drug: Tiotropium Bromide Monohydrate Inhale 2 puffs into the lungs daily.   spironolactone 100 MG tablet Commonly known as: ALDACTONE Take 1 tablet (100 mg total) by mouth 2 (two) times daily.   SUPER B COMPLEX PO Take 1 tablet by mouth daily.   Tart Cherry Advanced Caps Take 1 capsule by mouth 2 (two) times daily.   Vitamin D3 50 MCG (2000 UT) Tabs Take 2,000 Units by mouth daily.         Follow-up Information     Ria Bush, MD Follow up in 1 week(s).   Specialty: Family Medicine Contact information: [place] [address] [phone]                 Allergies  Allergen Reactions   Statins Shortness Of Breath    Cough, trouble breathing Cough, trouble breathing   Losartan Other (See Comments)    Causes him to have pain   Wellbutrin [Bupropion] Other (See Comments)    Worsened mood - crying   Allopurinol Nausea Only    Baclofen Nausea And Vomiting   Penicillins Nausea And Vomiting    Did it involve swelling of the face/tongue/throat, SOB, or low BP? N/A Did it involve sudden or severe rash/hives, skin peeling, or any reaction on the inside of your mouth or nose? N/A Did you need to seek medical attention at a hospital or doctor's office? N/A When did it last happen? Child     If all above answers are "NO", may proceed with cephalosporin use.   Tramadol Nausea Only     The results of significant diagnostics from this hospitalization (including imaging, microbiology, ancillary and laboratory) are listed below for reference.   Consultations:   Procedures/Studies: CT Head Wo Contrast  Result Date: 04/15/2022 CLINICAL DATA:  Altered mental status following an unwitnessed fall. Increasing confusion over the past 2 weeks. EXAM: CT HEAD WITHOUT CONTRAST TECHNIQUE: Contiguous axial images were obtained from the base of the skull through the vertex without intravenous contrast. RADIATION DOSE REDUCTION: This exam was performed according to the departmental dose-optimization program which includes automated exposure control, adjustment of the mA and/or kV according to patient size and/or use of iterative reconstruction technique. COMPARISON:  07/14/2020 FINDINGS: Brain: Stable mildly enlarged ventricles and cortical sulci. Stable minimal patchy white matter low density in both cerebral hemispheres. Stable small homogeneously fat density mass superior to the basilar artery tip, compatible with a small incidental lipoma. No intracranial hemorrhage, new mass lesion or CT evidence of acute infarction. Vascular: No hyperdense vessel or unexpected calcification. Skull: Normal. Negative for fracture or focal lesion. Sinuses/Orbits: Unremarkable. Other: Deviation of the midportion of the nasal septum to the right. IMPRESSION: 1. No acute abnormality. 2. Stable mild diffuse cerebral and cerebellar atrophy and minimal chronic small  vessel white matter ischemic changes in both cerebral hemispheres. Electronically Signed   By: Claudie Revering M.D.   On: 04/15/2022 13:06   DG Chest Port 1 View  Result Date: 04/15/2022 CLINICAL DATA:  Unwitnessed fall. EXAM: PORTABLE CHEST 1 VIEW COMPARISON:  01/31/2022 FINDINGS: The cardio pericardial silhouette is enlarged. There is pulmonary vascular congestion without overt pulmonary edema. The lungs are clear without focal pneumonia, edema, pneumothorax or pleural effusion. The visualized bony structures of the thorax are unremarkable.  Telemetry leads overlie the chest. IMPRESSION: Enlargement of the cardiopericardial silhouette with pulmonary vascular congestion. Electronically Signed   By: Misty Stanley M.D.   On: 04/15/2022 11:24      Labs: BNP (last 3 results) Recent Labs    01/31/22 1520 04/15/22 1024  BNP 169.1* 562.5*   Basic Metabolic Panel: Recent Labs  Lab 04/15/22 1024 04/16/22 0414 04/17/22 0411  NA 134* 134* 133*  K 3.6 3.8 4.0  CL 103 105 101  CO2 27 25 28   GLUCOSE 120* 77 121*  BUN 16 13 12   CREATININE 0.91 0.79 0.99  CALCIUM 8.4* 7.7* 8.4*  MG  --   --  2.1   Liver Function Tests: Recent Labs  Lab 04/15/22 1024 04/16/22 0414  AST 49* 51*  ALT 22 22  ALKPHOS 121 110  BILITOT 2.6* 2.6*  PROT 7.6 6.9  ALBUMIN 2.5* 2.3*   Recent Labs  Lab 04/15/22 1024  LIPASE 34   Recent Labs  Lab 04/15/22 1024  AMMONIA 100*   CBC: Recent Labs  Lab 04/15/22 1024 04/16/22 0414 04/17/22 0411  WBC 4.1 5.0 4.3  NEUTROABS 2.7 3.3  --   HGB 9.8* 9.9* 9.3*  HCT 30.0* 28.4* 27.6*  MCV 99.3 97.9 97.5  PLT 34* 35* 34*   Cardiac Enzymes: No results for input(s): "CKTOTAL", "CKMB", "CKMBINDEX", "TROPONINI" in the last 168 hours. BNP: Invalid input(s): "POCBNP" CBG: No results for input(s): "GLUCAP" in the last 168 hours. D-Dimer No results for input(s): "DDIMER" in the last 72 hours. Hgb A1c No results for input(s): "HGBA1C" in the last 72  hours. Lipid Profile No results for input(s): "CHOL", "HDL", "LDLCALC", "TRIG", "CHOLHDL", "LDLDIRECT" in the last 72 hours. Thyroid function studies No results for input(s): "TSH", "T4TOTAL", "T3FREE", "THYROIDAB" in the last 72 hours.  Invalid input(s): "FREET3" Anemia work up No results for input(s): "VITAMINB12", "FOLATE", "FERRITIN", "TIBC", "IRON", "RETICCTPCT" in the last 72 hours. Urinalysis    Component Value Date/Time   COLORURINE YELLOW (A) 04/15/2022 1323   APPEARANCEUR CLEAR (A) 04/15/2022 1323   APPEARANCEUR Hazy (A) 12/18/2019 1145   LABSPEC 1.014 04/15/2022 1323   PHURINE 6.0 04/15/2022 1323   GLUCOSEU NEGATIVE 04/15/2022 1323   HGBUR MODERATE (A) 04/15/2022 1323   BILIRUBINUR NEGATIVE 04/15/2022 1323   BILIRUBINUR 1+ 12/13/2021 1214   BILIRUBINUR Negative 12/18/2019 1145   KETONESUR NEGATIVE 04/15/2022 1323   PROTEINUR NEGATIVE 04/15/2022 1323   UROBILINOGEN 4.0 (A) 12/13/2021 1214   UROBILINOGEN 1.0 03/01/2008 1803   NITRITE NEGATIVE 04/15/2022 1323   LEUKOCYTESUR NEGATIVE 04/15/2022 1323   Sepsis Labs Recent Labs  Lab 04/15/22 1024 04/16/22 0414 04/17/22 0411  WBC 4.1 5.0 4.3   Microbiology Recent Results (from the past 240 hour(s))  Resp Panel by RT-PCR (Flu A&B, Covid) Anterior Nasal Swab     Status: None   Collection Time: 04/15/22 10:24 AM   Specimen: Anterior Nasal Swab  Result Value Ref Range Status   SARS Coronavirus 2 by RT PCR NEGATIVE NEGATIVE Final    Comment: (NOTE) SARS-CoV-2 target nucleic acids are NOT DETECTED.  The SARS-CoV-2 RNA is generally detectable in upper respiratory specimens during the acute phase of infection. The lowest concentration of SARS-CoV-2 viral copies this assay can detect is 138 copies/mL. A negative result does not preclude SARS-Cov-2 infection and should not be used as the sole basis for treatment or other patient management decisions. A negative result may occur with  improper specimen  collection/handling, submission of specimen other than nasopharyngeal swab,  presence of viral mutation(s) within the areas targeted by this assay, and inadequate number of viral copies(<138 copies/mL). A negative result must be combined with clinical observations, patient history, and epidemiological information. The expected result is Negative.  Fact Sheet for Patients:  EntrepreneurPulse.com.au  Fact Sheet for Healthcare Providers:  IncredibleEmployment.be  This test is no t yet approved or cleared by the [place] FDA and  has been authorized for detection and/or diagnosis of SARS-CoV-2 by FDA under an Emergency Use Authorization (EUA). This EUA will remain  in effect (meaning this test can be used) for the duration of the COVID-19 declaration under Section 564(b)(1) of the Act, 21 U.S.C.section 360bbb-3(b)(1), unless the authorization is terminated  or revoked sooner.       Influenza A by PCR NEGATIVE NEGATIVE Final   Influenza B by PCR NEGATIVE NEGATIVE Final    Comment: (NOTE) The Xpert Xpress SARS-CoV-2/FLU/RSV plus assay is intended as an aid in the diagnosis of influenza from Nasopharyngeal swab specimens and should not be used as a sole basis for treatment. Nasal washings and aspirates are unacceptable for Xpert Xpress SARS-CoV-2/FLU/RSV testing.  Fact Sheet for Patients: EntrepreneurPulse.com.au  Fact Sheet for Healthcare Providers: IncredibleEmployment.be  This test is not yet approved or cleared by the [place] FDA and has been authorized for detection and/or diagnosis of SARS-CoV-2 by FDA under an Emergency Use Authorization (EUA). This EUA will remain in effect (meaning this test can be used) for the duration of the COVID-19 declaration under Section 564(b)(1) of the Act, 21 U.S.C. section 360bbb-3(b)(1), unless the authorization is terminated or revoked.  Performed at [facility], [address] [place], [place] [address]   Blood Culture (routine x 2)     Status: None (Preliminary result)   Collection Time: 04/15/22 10:24 AM   Specimen: Right Antecubital; Blood  Result Value Ref Range Status   Specimen Description RIGHT ANTECUBITAL  Final   Special Requests   Final    BOTTLES DRAWN AEROBIC AND ANAEROBIC Blood Culture adequate volume   Culture   Final    NO GROWTH 2 DAYS Performed at [facility], [address], [place], [place] [address]    Report Status PENDING  Incomplete  Urine Culture     Status: None   Collection Time: 04/15/22  1:23 PM   Specimen: Urine, Random  Result Value Ref Range Status   Specimen Description   Final    URINE, RANDOM Performed at [facility], [address], [place], [place] [address]    Special Requests   Final    NONE Performed at [facility], [address] [place], [place] [address]    Culture   Final    NO GROWTH Performed at [facility], [place] [address] [place], [place] [address]    Report Status 04/16/2022 FINAL  Final     Total time spend on discharging this patient, including the last patient exam, discussing the hospital stay, instructions for ongoing care as it relates to all pertinent caregivers, as well as preparing the medical discharge records, prescriptions, and/or referrals as applicable, is 35 minutes.    Enzo Bi, MD  [facility] 04/17/2022, 8:05 AM

## 2022-04-17 NOTE — TOC Initial Note (Deleted)
Transition of Care [facility]) - Initial/Assessment Note    Patient Details  Name: [name] MRN: [id number] Date of Birth: [birth year]  Transition of Care [facility]) CM/SW Contact:    Colen Darling, [place] Phone Number: 04/17/2022, 12:47 PM  Clinical Narrative:                  The patient will discharge to [facility] at home today.       Patient Goals and CMS Choice    Home Hospice with [facility].    Expected Discharge Plan and [place]   Expected Discharge Date: 04/17/22                                    Prior Living Arrangements/Services      Family home                 Activities of Daily Living Home Assistive Devices/Equipment: Gilford Rile (specify type) ADL Screening (condition at time of admission) Patient's cognitive ability adequate to safely complete daily activities?: No Is the patient deaf or have difficulty hearing?: No Does the patient have difficulty seeing, even when wearing glasses/contacts?: No Does the patient have difficulty concentrating, remembering, or making decisions?: Yes Patient able to express need for assistance with ADLs?: Yes Does the patient have difficulty dressing or bathing?: Yes Independently performs ADLs?: No Communication: Independent Dressing (OT): Needs assistance Is this a change from baseline?: Pre-admission baseline Grooming: Needs assistance Is this a change from baseline?: Pre-admission baseline Feeding: Independent Bathing: Dependent Is this a change from baseline?: Pre-admission baseline Toileting: Dependent Is this a change from baseline?: Pre-admission baseline In/Out Bed: Needs assistance Is this a change from baseline?: Pre-admission baseline Walks in Home: Needs assistance Is this a change from baseline?: Pre-admission baseline Does the patient have difficulty walking or climbing stairs?: Yes Weakness of Legs: Both Weakness of Arms/Hands:  None  Permission Sought/Granted                  Emotional Assessment              Admission diagnosis:  Morbid obesity ([place]) [E66.01] Hepatic encephalopathy ([place]) [K76.82] Cellulitis of leg, right [F35.456] Fall in home, initial encounter [W19.Merril Abbe, Y56.389] Patient Active Problem List   Diagnosis Date Noted   Hepatic encephalopathy ([place]) 04/15/2022   Hospice care patient 02/01/2022   Cellulitis of perineum    Scrotal swelling    Scrotal pain    Edema    Chronic combined systolic and diastolic CHF (congestive heart failure) ([place]) 01/31/2022   Cellulitis, scrotum 01/31/2022   Venous stasis ulcer of left lower leg with edema of left lower leg (HCC) 01/06/2022   Lymphadenopathy of head and neck region 12/15/2021   FTT (failure to thrive) in adult 11/16/2021   Driving safety issue 10/06/2021   Decreased visual acuity 10/06/2021   Cognitive safety issue 06/27/2021   Status post right hip replacement 12/04/2020   Carotid stenosis, asymptomatic, bilateral 08/28/2020   Statin intolerance 08/05/2020   Mass of both parotid glands 08/05/2020   Memory deficit 01/10/2020   Hearing loss 08/07/2018   Abdominal aortic aneurysm (AAA) without rupture ([place]) 01/02/2018   History of MRSA infection 01/02/2018   History of myocardial infarction 01/02/2018   Aortic atherosclerosis ([place]) 01/01/2018   MDD (major depressive disorder), recurrent severe, without psychosis ([place]) 11/17/2017   Candidal intertrigo  05/08/2017   Falls frequently 03/13/2017   Abnormal drug screen 07/09/2016   Thrombocytopenia ([place]) 04/29/2016   Anemia 04/29/2016   Protein-calorie malnutrition ([place]) 04/29/2016   RUQ abdominal pain 04/26/2016   Encounter for chronic pain management 01/04/2016   Preop cardiovascular exam 09/01/2014   Chronic fatigue 07/22/2014   Esophageal dysphagia 07/22/2014   Gross hematuria 05/19/2014   Encounter for general adult medical examination with abnormal findings 04/21/2014    Advanced care planning/counseling discussion 04/21/2014   Orthostatic hypotension 03/13/2014   Cirrhosis of liver with ascites ([place]) 01/03/2014   Osteoarthritis of right hip 08/16/2012   Medicare annual wellness visit, subsequent 06/07/2012   Chronic dyspnea 10/06/2011   DDD (degenerative disc disease), cervical    Vitamin D deficiency 03/14/2011   Vitamin B12 deficiency 08/05/2010   Prediabetes 06/27/2010   OSA (obstructive sleep apnea) 05/16/2010   Cervical spondylosis 05/05/2010   Obesity, morbid, BMI 40.0-49.9 ([place]) 05/03/2010   Hidradenitis 05/03/2010   Smoker 10/21/2009   HLD (hyperlipidemia) 09/07/2009   Gout 09/07/2009   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 [place]Phone: [phone]: [phone] [facility] [place] [address], [id number]: [phone]: [phone] [facility] [address]: [phone]: [phone] [place]Phone: [phone]: [phone]    Social Determinants of Health (SDOH) Interventions    Readmission Risk Interventions     No data to display

## 2022-04-17 NOTE — Progress Notes (Signed)
[name] to be D/C'd  per MD order.  Discussed with the patient and all questions fully answered.  VSS, Skin clean, dry and intact without evidence of skin break down, no evidence of skin tears noted.  IV catheter discontinued intact. Site without signs and symptoms of complications. Dressing and pressure applied.  An After Visit Summary was printed and given to the patient. Patient received prescription.  D/c education completed with patient/family including follow up instructions, medication list, d/c activities limitations if indicated, with other d/c instructions as indicated by MD - patient able to verbalize understanding, all questions fully answered.   Patient instructed to return to ED, call 911, or call MD for any changes in condition.   Patient to be escorted stretcher, and D/C home via EMS.

## 2022-04-17 NOTE — TOC Progression Note (Signed)
Transition of Care [facility]) - Progression Note    Patient Details  Name: [name] MRN: [id number] Date of Birth: 1956-04-21  Transition of Care [facility]) CM/SW [place], [place] Phone Number: 04/17/2022, 10:22 AM  Clinical Narrative:      TOC following up with [facility] at home. The patient will need home hospice services at discharge.      Expected Discharge Plan and [place]         Expected Discharge Date: 04/17/22                                     Social Determinants of Health (SDOH) Interventions    Readmission Risk Interventions     No data to display

## 2022-04-17 NOTE — Progress Notes (Signed)
   04/17/22 1414  Medical Necessity for Transport Certificate --- IF THIS TRANSPORT IS ROUND TRIP OR SCHEDULED AND REPEATED, A PHYSICIAN MUST COMPLETE THIS FORM  Transport from: Teacher, English as a foreign language) Doctor, hospital to (Location) Other (Comment) ([address], [place], [place] [address])  Did the patient arrive from a [place], [place] or Group Home? No  Is this the closest appropriate facility? Yes  Date of Transport Service 04/17/22  Name of [facility]  Round Trip Transport? No  Reason for Transport  --   Is this a hospice patient? No  Describe the Medical Condition cirrhosis, NASH on hospice, chronic pain syndrome, DDD, spinal stenosis, Fentanyl and oxycodone, CAD s/p DES, HFrEF, EF 35-40%, COPD, hypertension, hyperlipidemia, type 2 diabetes, altered mental status  Q1 Are ALL the following "true"? 1. Patient unable to get up from bed without assistance  AND  2. Unable to ambulate  AND  3. Unable to sit in a chair, including wheelchair. Yes  Q2 Could the patient be transported safely by other means of transportation (I.E., wheelchair van)? No  Q3 Please check any of the following conditions that apply at the time of transport: Altered mental status;Risk of injury to self and/or others;Other (Comment) (balance)  Electronic Signature Colen Darling  Credentials DP  Date Signed 04/17/22

## 2022-04-17 NOTE — TOC Transition Note (Signed)
Transition of Care [facility]) - CM/SW Discharge Note   Patient Details  Name: [name] MRN: [id number] Date of Birth: February 20, 1956  Transition [facility] Care [facility]) CM/SW Contact:  Colen Darling, [place] Phone Number: 04/17/2022, 12:49 PM   Clinical Narrative:     The patient will discharge home with [facility].   Final next level of care: [place]     Patient Goals and CMS Choice      Home Hospice  Discharge Placement                Patient to be transferred to facility by: [facility] Name of family member notified: Attempted to contact family. Ring no answer. Patient and family notified of of transfer: 04/17/22  Discharge Plan and [place]                  Social Determinants of Health (SDOH) Interventions     Readmission Risk Interventions     No data to display

## 2022-04-18 ENCOUNTER — Telehealth: Payer: Self-pay

## 2022-04-18 NOTE — Telephone Encounter (Signed)
Transition Care Management Unsuccessful Follow-up Telephone Call  Date of discharge and from where: [facility] 04-17-22 Dx: hepatic encephalopathy   Attempts:  1st Attempt  Reason for unsuccessful TCM follow-up call:  Left voice message   Juanda Crumble LPN [place] Direct Dial [phone]    Transition Care Management Unsuccessful Follow-up Telephone Call  Date of discharge and from where:   TCM DC [facility] 04-17-22 Dx: hepatic encephalopathy     Attempts:  2nd Attempt  Reason for unsuccessful TCM follow-up call:  Left voice message   Juanda Crumble LPN [place] Direct Dial [phone]  Transition Care Management Unsuccessful Follow-up Telephone Call  Date of discharge and from where:    TCM DC [facility] 04-17-22 Dx: hepatic encephalopathy    Attempts:  3rd attempt  Reason for unsuccessful TCM follow-up call:  Left voice message   Juanda Crumble LPN [place] Direct Dial [phone]

## 2022-04-20 LAB — CULTURE, BLOOD (ROUTINE X 2)
Culture: NO GROWTH
Special Requests: ADEQUATE

## 2022-04-24 ENCOUNTER — Telehealth: Payer: Self-pay | Admitting: Family Medicine

## 2022-04-24 ENCOUNTER — Inpatient Hospital Stay: Payer: Medicare HMO | Admitting: Family Medicine

## 2022-04-24 NOTE — Telephone Encounter (Signed)
Agree with this. Thank you.  

## 2022-04-24 NOTE — Telephone Encounter (Signed)
Spoke to Martin and was advised that the patient's wife has waited until the last minute again to request refills. Lujean Rave stated that she just wanted to confirm that Dr. Danise Mina is okay with their hospice doctor filling his medications now and in the future. Lujean Rave stated that patient's wife has covid and sounds like the whole house has it now.  Lujean Rave was advised per Dr. Danise Mina it is okay for the hospice doctor to take over filling these medications for the patient.

## 2022-04-24 NOTE — Telephone Encounter (Signed)
Hospice called asking if Dr. Darnell Level could refill oxyCODONE (OXY IR/ROXICODONE) 5 MG immediate release tablet for pt. Hospice was wondering if Dr. Darnell Level would refill meds or would the hospice doctor would?

## 2022-04-25 ENCOUNTER — Telehealth: Payer: Self-pay | Admitting: Family Medicine

## 2022-04-25 NOTE — Telephone Encounter (Signed)
Ivin Booty from [employer] called in and would would like to know if a medication that is equivalent to medication desvenlafaxine (PRISTIQ) 25 MG 24 hr tablet can be called in for patient? they are not going to cover it neither will the patient,due to the cost. FYI,no Wellbutrin ,he's allergic. Please advise.

## 2022-04-25 NOTE — Telephone Encounter (Signed)
I'm sorry to hear she tested positive for COVID. Did husband also test positive, if so did he seek evaluation for this?  Plz call tomorrow for update on symptoms

## 2022-04-25 NOTE — Telephone Encounter (Signed)
Pt wife called in stated she tested positive for Covid and could not bring pt to his appointment will call back to reschedule

## 2022-04-26 NOTE — Telephone Encounter (Signed)
Lujean Rave with [facility] notified  as instructed by telephone. Lujean Rave stated that the co pay for the patient is $71. Lujean Rave stated that she will talk with their hospice doctor and see if they can override the co pay.

## 2022-04-26 NOTE — Telephone Encounter (Addendum)
Pristiq is the safest antidepressant medication for his advanced cirrhosis.  Is pt able to cover the cost of medication?

## 2022-05-01 ENCOUNTER — Telehealth: Payer: Self-pay | Admitting: Family Medicine

## 2022-05-01 NOTE — Telephone Encounter (Signed)
Spoke to patient's wife and was advised that her husband's mind is slipping. Advised patient's wife to reach out to Hospice and get them involved in his symptoms. Patient's wife stated that she will call his hospice nurse.

## 2022-05-01 NOTE — Telephone Encounter (Signed)
Pt wife called in requesting a call back to discuss issues that is going on with pt. Please Advise # [phone]

## 2022-05-01 NOTE — Telephone Encounter (Signed)
Noted. Agree to ask hospice for in-home eval

## 2022-05-03 ENCOUNTER — Emergency Department (HOSPITAL_COMMUNITY)

## 2022-05-03 ENCOUNTER — Encounter (HOSPITAL_COMMUNITY): Payer: Self-pay | Admitting: Emergency Medicine

## 2022-05-03 ENCOUNTER — Inpatient Hospital Stay (HOSPITAL_COMMUNITY)
Admission: EM | Admit: 2022-05-03 | Discharge: 2022-05-09 | DRG: 177 | Disposition: A | Discharge status: Hospice | Attending: Internal Medicine | Admitting: Internal Medicine

## 2022-05-03 DIAGNOSIS — I110 Hypertensive heart disease with heart failure: Secondary | ICD-10-CM | POA: Diagnosis present

## 2022-05-03 DIAGNOSIS — S01101A Unspecified open wound of right eyelid and periocular area, initial encounter: Secondary | ICD-10-CM | POA: Diagnosis present

## 2022-05-03 DIAGNOSIS — K7682 Hepatic encephalopathy: Secondary | ICD-10-CM | POA: Diagnosis present

## 2022-05-03 DIAGNOSIS — D689 Coagulation defect, unspecified: Secondary | ICD-10-CM | POA: Diagnosis present

## 2022-05-03 DIAGNOSIS — Z8679 Personal history of other diseases of the circulatory system: Secondary | ICD-10-CM

## 2022-05-03 DIAGNOSIS — D638 Anemia in other chronic diseases classified elsewhere: Secondary | ICD-10-CM | POA: Diagnosis present

## 2022-05-03 DIAGNOSIS — J449 Chronic obstructive pulmonary disease, unspecified: Secondary | ICD-10-CM | POA: Diagnosis present

## 2022-05-03 DIAGNOSIS — U071 COVID-19: Principal | ICD-10-CM | POA: Diagnosis present

## 2022-05-03 DIAGNOSIS — Z515 Encounter for palliative care: Secondary | ICD-10-CM

## 2022-05-03 DIAGNOSIS — W19XXXA Unspecified fall, initial encounter: Secondary | ICD-10-CM | POA: Diagnosis present

## 2022-05-03 DIAGNOSIS — K7581 Nonalcoholic steatohepatitis (NASH): Secondary | ICD-10-CM | POA: Diagnosis present

## 2022-05-03 DIAGNOSIS — N179 Acute kidney failure, unspecified: Secondary | ICD-10-CM | POA: Diagnosis present

## 2022-05-03 DIAGNOSIS — Z6841 Body Mass Index (BMI) 40.0 and over, adult: Secondary | ICD-10-CM | POA: Diagnosis not present

## 2022-05-03 DIAGNOSIS — K7460 Unspecified cirrhosis of liver: Secondary | ICD-10-CM | POA: Diagnosis present

## 2022-05-03 DIAGNOSIS — G9341 Metabolic encephalopathy: Secondary | ICD-10-CM | POA: Diagnosis present

## 2022-05-03 DIAGNOSIS — E785 Hyperlipidemia, unspecified: Secondary | ICD-10-CM | POA: Diagnosis present

## 2022-05-03 DIAGNOSIS — F17210 Nicotine dependence, cigarettes, uncomplicated: Secondary | ICD-10-CM | POA: Diagnosis present

## 2022-05-03 DIAGNOSIS — I5042 Chronic combined systolic (congestive) and diastolic (congestive) heart failure: Secondary | ICD-10-CM | POA: Diagnosis present

## 2022-05-03 DIAGNOSIS — K219 Gastro-esophageal reflux disease without esophagitis: Secondary | ICD-10-CM | POA: Diagnosis present

## 2022-05-03 DIAGNOSIS — X58XXXA Exposure to other specified factors, initial encounter: Secondary | ICD-10-CM | POA: Diagnosis present

## 2022-05-03 DIAGNOSIS — E871 Hypo-osmolality and hyponatremia: Secondary | ICD-10-CM | POA: Diagnosis present

## 2022-05-03 DIAGNOSIS — K7290 Hepatic failure, unspecified without coma: Secondary | ICD-10-CM | POA: Diagnosis not present

## 2022-05-03 DIAGNOSIS — Z885 Allergy status to narcotic agent status: Secondary | ICD-10-CM | POA: Diagnosis not present

## 2022-05-03 DIAGNOSIS — I2510 Atherosclerotic heart disease of native coronary artery without angina pectoris: Secondary | ICD-10-CM | POA: Diagnosis present

## 2022-05-03 DIAGNOSIS — Z981 Arthrodesis status: Secondary | ICD-10-CM

## 2022-05-03 DIAGNOSIS — Y929 Unspecified place or not applicable: Secondary | ICD-10-CM

## 2022-05-03 DIAGNOSIS — E559 Vitamin D deficiency, unspecified: Secondary | ICD-10-CM | POA: Diagnosis present

## 2022-05-03 DIAGNOSIS — R4182 Altered mental status, unspecified: Secondary | ICD-10-CM | POA: Diagnosis not present

## 2022-05-03 DIAGNOSIS — G894 Chronic pain syndrome: Secondary | ICD-10-CM | POA: Diagnosis present

## 2022-05-03 DIAGNOSIS — Z880 Allergy status to penicillin: Secondary | ICD-10-CM

## 2022-05-03 DIAGNOSIS — D696 Thrombocytopenia, unspecified: Secondary | ICD-10-CM | POA: Diagnosis present

## 2022-05-03 DIAGNOSIS — E669 Obesity, unspecified: Secondary | ICD-10-CM | POA: Diagnosis present

## 2022-05-03 DIAGNOSIS — E119 Type 2 diabetes mellitus without complications: Secondary | ICD-10-CM | POA: Diagnosis present

## 2022-05-03 DIAGNOSIS — K766 Portal hypertension: Secondary | ICD-10-CM | POA: Diagnosis present

## 2022-05-03 DIAGNOSIS — E7220 Disorder of urea cycle metabolism, unspecified: Secondary | ICD-10-CM | POA: Diagnosis not present

## 2022-05-03 DIAGNOSIS — Z888 Allergy status to other drugs, medicaments and biological substances status: Secondary | ICD-10-CM

## 2022-05-03 DIAGNOSIS — D61818 Other pancytopenia: Secondary | ICD-10-CM | POA: Diagnosis present

## 2022-05-03 DIAGNOSIS — J42 Unspecified chronic bronchitis: Secondary | ICD-10-CM | POA: Diagnosis not present

## 2022-05-03 DIAGNOSIS — R9431 Abnormal electrocardiogram [ECG] [EKG]: Secondary | ICD-10-CM | POA: Diagnosis not present

## 2022-05-03 DIAGNOSIS — Z79899 Other long term (current) drug therapy: Secondary | ICD-10-CM

## 2022-05-03 DIAGNOSIS — I959 Hypotension, unspecified: Secondary | ICD-10-CM | POA: Diagnosis not present

## 2022-05-03 DIAGNOSIS — Z833 Family history of diabetes mellitus: Secondary | ICD-10-CM

## 2022-05-03 DIAGNOSIS — Z8249 Family history of ischemic heart disease and other diseases of the circulatory system: Secondary | ICD-10-CM

## 2022-05-03 DIAGNOSIS — I10 Essential (primary) hypertension: Secondary | ICD-10-CM | POA: Diagnosis not present

## 2022-05-03 DIAGNOSIS — I252 Old myocardial infarction: Secondary | ICD-10-CM

## 2022-05-03 DIAGNOSIS — Z8261 Family history of arthritis: Secondary | ICD-10-CM

## 2022-05-03 DIAGNOSIS — Z841 Family history of disorders of kidney and ureter: Secondary | ICD-10-CM

## 2022-05-03 DIAGNOSIS — R69 Illness, unspecified: Secondary | ICD-10-CM | POA: Diagnosis not present

## 2022-05-03 LAB — CBC WITH DIFFERENTIAL/PLATELET
Abs Immature Granulocytes: 0.04 10*3/uL (ref 0.00–0.07)
Basophils Absolute: 0.1 10*3/uL (ref 0.0–0.1)
Basophils Relative: 1 %
Eosinophils Absolute: 0.1 10*3/uL (ref 0.0–0.5)
Eosinophils Relative: 2 %
HCT: 37 % — ABNORMAL LOW (ref 39.0–52.0)
Hemoglobin: 12.2 g/dL — ABNORMAL LOW (ref 13.0–17.0)
Immature Granulocytes: 1 %
Lymphocytes Relative: 15 %
Lymphs Abs: 1 10*3/uL (ref 0.7–4.0)
MCH: 33.2 pg (ref 26.0–34.0)
MCHC: 33 g/dL (ref 30.0–36.0)
MCV: 100.8 fL — ABNORMAL HIGH (ref 80.0–100.0)
Monocytes Absolute: 0.6 10*3/uL (ref 0.1–1.0)
Monocytes Relative: 9 %
Neutro Abs: 4.7 10*3/uL (ref 1.7–7.7)
Neutrophils Relative %: 72 %
Platelets: 48 10*3/uL — ABNORMAL LOW (ref 150–400)
RBC: 3.67 MIL/uL — ABNORMAL LOW (ref 4.22–5.81)
RDW: 19.8 % — ABNORMAL HIGH (ref 11.5–15.5)
WBC: 6.6 10*3/uL (ref 4.0–10.5)
nRBC: 0 % (ref 0.0–0.2)

## 2022-05-03 LAB — COMPREHENSIVE METABOLIC PANEL
ALT: 23 U/L (ref 0–44)
AST: 39 U/L (ref 15–41)
Albumin: 2.5 g/dL — ABNORMAL LOW (ref 3.5–5.0)
Alkaline Phosphatase: 133 U/L — ABNORMAL HIGH (ref 38–126)
Anion gap: 12 (ref 5–15)
BUN: 50 mg/dL — ABNORMAL HIGH (ref 8–23)
CO2: 25 mmol/L (ref 22–32)
Calcium: 8.6 mg/dL — ABNORMAL LOW (ref 8.9–10.3)
Chloride: 98 mmol/L (ref 98–111)
Creatinine, Ser: 2.37 mg/dL — ABNORMAL HIGH (ref 0.61–1.24)
GFR, Estimated: 29 mL/min — ABNORMAL LOW (ref >60)
Glucose, Bld: 107 mg/dL — ABNORMAL HIGH (ref 70–99)
Potassium: 4.1 mmol/L (ref 3.5–5.1)
Sodium: 135 mmol/L (ref 135–145)
Total Bilirubin: 2.1 mg/dL — ABNORMAL HIGH (ref 0.3–1.2)
Total Protein: 8.3 g/dL — ABNORMAL HIGH (ref 6.5–8.1)

## 2022-05-03 LAB — I-STAT VENOUS BLOOD GAS, ED
Acid-Base Excess: 2 mmol/L (ref 0.0–2.0)
Bicarbonate: 25.8 mmol/L (ref 20.0–28.0)
Calcium, Ion: 1.01 mmol/L — ABNORMAL LOW (ref 1.15–1.40)
HCT: 38 % — ABNORMAL LOW (ref 39.0–52.0)
Hemoglobin: 12.9 g/dL — ABNORMAL LOW (ref 13.0–17.0)
O2 Saturation: 85 %
Potassium: 4.2 mmol/L (ref 3.5–5.1)
Sodium: 137 mmol/L (ref 135–145)
TCO2: 27 mmol/L (ref 22–32)
pCO2, Ven: 34.7 mmHg — ABNORMAL LOW (ref 44–60)
pH, Ven: 7.479 — ABNORMAL HIGH (ref 7.25–7.43)
pO2, Ven: 46 mmHg — ABNORMAL HIGH (ref 32–45)

## 2022-05-03 LAB — PROTIME-INR
INR: 1.4 — ABNORMAL HIGH (ref 0.8–1.2)
Prothrombin Time: 16.7 seconds — ABNORMAL HIGH (ref 11.4–15.2)

## 2022-05-03 LAB — AMMONIA: Ammonia: 165 umol/L — ABNORMAL HIGH (ref 9–35)

## 2022-05-03 LAB — LIPASE, BLOOD: Lipase: 41 U/L (ref 11–51)
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
## 2022-05-03 NOTE — ED Provider Notes (Signed)
[facility] EMERGENCY DEPARTMENT Provider Note   CSN: [id number] Arrival date & time: 05/03/22  2040     History {Add pertinent medical, surgical, social history, OB history to HPI:1} Chief Complaint  Patient presents with   Altered Mental Status    [name] is a 66 y.o. male.  The history is provided by the patient and medical records.  Altered Mental Status [name] is a 66 y.o. male who presents to the Emergency Department complaining of altered mental status.  Level 5 caveat due to confusion.  Patient states that he does not know why he is in the emergency department but states he had a disagreement with his wife.  He does complain of abdominal pain, which is chronic in nature.  He reports eating and drinking well.  He does have shortness of breath at times.     Home Medications Prior to Admission medications   Medication Sig Start Date End Date Taking? Authorizing Provider  albuterol (PROVENTIL) (2.5 MG/3ML) 0.083% nebulizer solution USE 1 VIAL PER NEBULIZER EVERY 6 HRS AS NEEDED FOR WHEEZING 12/03/20   Ria Bush, MD  albuterol (VENTOLIN HFA) 108 (90 Base) MCG/ACT inhaler TAKE 2 PUFFS BY MOUTH EVERY 6 HOURS AS NEEDED FOR WHEEZE OR SHORTNESS OF BREATH 12/14/21   Ria Bush, MD  ARIPiprazole (ABILIFY) 5 MG tablet Take 1 tablet (5 mg total) by mouth daily. 10/04/21   Ria Bush, MD  B Complex-C (SUPER B COMPLEX PO) Take 1 tablet by mouth daily.    [provider]  Ca Phosphate-Cholecalciferol [phone] MG-UNIT TABS Take 1 tablet by mouth daily.  04/29/16   [provider]  cholecalciferol 2000 units TABS Take 2,000 Units by mouth daily. 04/29/16   Ria Bush, MD  desvenlafaxine (PRISTIQ) 25 MG 24 hr tablet TAKE 1 TABLET (25 MG TOTAL) BY MOUTH DAILY. 04/11/22   Ria Bush, MD  fentaNYL (DURAGESIC) 75 MCG/HR Place 1 patch onto the skin every 3 (three) days. Patient taking differently: Place 1 patch  onto the skin every 3 (three) days. Patch applied yesterday 03/15/22   Ria Bush, MD  ferrous sulfate 325 (65 FE) MG tablet Take 1 tablet (325 mg total) by mouth every other day. 04/29/19   Ria Bush, MD  fluticasone [facility]) 50 MCG/ACT nasal spray Place 2 sprays into both nostrils daily as needed for allergies.  02/09/12   Ria Bush, MD  folic acid (FOLVITE) 1 MG tablet TAKE 1 TABLET BY MOUTH EVERY DAY 11/07/21   Ria Bush, MD  furosemide (LASIX) 40 MG tablet Take 1 tablet (40 mg total) by mouth 2 (two) times daily. 01/04/22   Ria Bush, MD  gabapentin (NEURONTIN) 300 MG capsule Take 1 capsule (300 mg total) by mouth in the morning and at bedtime. 06/27/21   Ria Bush, MD  Lactulose 20 GM/30ML SOLN Take 30 mLs (20 g total) by mouth 3 (three) times daily. 04/17/22 07/16/22  Enzo Bi, MD  Misc Natural Products [facility] ADVANCED) CAPS Take 1 capsule by mouth 2 (two) times daily.    [provider]  nicotine (NICODERM CQ - DOSED IN MG/24 HOURS) 14 mg/24hr patch APPLY 1 PATCH ONTO THE SKIN EVERY DAY 02/09/22   Ria Bush, MD  nitroGLYCERIN (NITROSTAT) 0.4 MG SL tablet Place 1 tablet (0.4 mg total) under the tongue every 5 (five) minutes as needed for chest pain. 10/10/12   Lelon Perla, MD  nystatin cream (MYCOSTATIN) APPLY TO AFFECTED AREA TWICE A DAY 07/19/21  Billey Co, MD  omeprazole (PRILOSEC) 40 MG capsule TAKE 1 CAPSULE BY MOUTH EVERY DAY 12/14/21   Ria Bush, MD  ondansetron (ZOFRAN) 4 MG tablet TAKE 1 TABLET BY MOUTH EVERY 8 HOURS AS NEEDED FOR NAUSEA AND VOMITING 01/09/22   [name], MD  oxyCODONE (OXY IR/ROXICODONE) 5 MG immediate release tablet Take 1 tablet (5 mg total) by mouth every 4 (four) hours as needed for severe pain. 04/12/22   Ria Bush, MD  Red Yeast Rice 600 MG CAPS Take 2 capsules by mouth daily.    [provider]  spironolactone (ALDACTONE) 100 MG tablet Take 1 tablet (100 mg total) by  mouth 2 (two) times daily. 02/03/22   Ria Bush, MD  Tiotropium Bromide Monohydrate (SPIRIVA RESPIMAT) 2.5 MCG/ACT AERS Inhale 2 puffs into the lungs daily. 10/04/21   Ria Bush, MD  vitamin B-12 (CYANOCOBALAMIN) 500 MCG tablet Take 1 tablet (500 mcg total) by mouth every Monday, Wednesday, and Friday. 12/27/18   Ria Bush, MD      Allergies    Statins, Losartan, Wellbutrin [bupropion], Allopurinol, Baclofen, Penicillins, and Tramadol    Review of Systems   Review of Systems  All other systems reviewed and are negative.   Physical Exam Updated Vital Signs BP 116/60   Pulse 79   Temp 99.1 F (37.3 C)   Resp 18   SpO2 98%  Physical Exam Vitals and nursing note reviewed.  Constitutional:      Appearance: He is well-developed.  HENT:     Head: Normocephalic.     Comments: Abrasion to the right forehead and cheek Cardiovascular:     Rate and Rhythm: Normal rate and regular rhythm.  Pulmonary:     Effort: Pulmonary effort is normal. No respiratory distress.  Abdominal:     Palpations: Abdomen is soft.     Tenderness: There is no abdominal tenderness. There is no guarding or rebound.  Musculoskeletal:        General: No tenderness.  Skin:    General: Skin is warm and dry.  Neurological:     Mental Status: He is alert and oriented to person, place, and time.     Comments: Mildly confused but oriented to person, place, time.  Psychiatric:        Behavior: Behavior normal.     ED Results / Procedures / Treatments   Labs (all labs ordered are listed, but only abnormal results are displayed) Labs Reviewed  COMPREHENSIVE METABOLIC PANEL - Abnormal; Notable for the following components:      Result Value   Glucose, Bld 107 (*)    BUN 50 (*)    Creatinine, Ser 2.37 (*)    Calcium 8.6 (*)    Total Protein 8.3 (*)    Albumin 2.5 (*)    Alkaline Phosphatase 133 (*)    Total Bilirubin 2.1 (*)    GFR, Estimated 29 (*)    All other components within normal  limits  CBC WITH DIFFERENTIAL/PLATELET - Abnormal; Notable for the following components:   RBC 3.67 (*)    Hemoglobin 12.2 (*)    HCT 37.0 (*)    MCV 100.8 (*)    RDW 19.8 (*)    Platelets 48 (*)    All other components within normal limits  AMMONIA - Abnormal; Notable for the following components:   Ammonia 165 (*)    All other components within normal limits  PROTIME-INR - Abnormal; Notable for the following components:   Prothrombin Time  16.7 (*)    INR 1.4 (*)    All other components within normal limits  I-STAT VENOUS BLOOD GAS, ED - Abnormal; Notable for the following components:   pH, Ven 7.479 (*)    pCO2, Ven 34.7 (*)    pO2, Ven 46 (*)    Calcium, Ion 1.01 (*)    HCT 38.0 (*)    Hemoglobin 12.9 (*)    All other components within normal limits  LIPASE, BLOOD  BLOOD GAS, VENOUS  URINALYSIS, ROUTINE W REFLEX MICROSCOPIC  RAPID URINE DRUG SCREEN, HOSP PERFORMED    EKG None  Radiology No results found.  Procedures Procedures  {Document cardiac monitor, telemetry assessment procedure when appropriate:1}  Medications Ordered in ED Medications - No data to display  ED Course/ Medical Decision Making/ A&P                           Medical Decision Making  ***  {Document critical care time when appropriate:1} {Document review of labs and clinical decision tools ie heart score, Chads2Vasc2 etc:1}  {Document your independent review of radiology images, and any outside records:1} {Document your discussion with family members, caretakers, and with consultants:1} {Document social determinants of health affecting pt's care:1} {Document your decision making why or why not admission, treatments were needed:1} Final Clinical Impression(s) / ED Diagnoses Final diagnoses:  None    Rx / DC Orders ED Discharge Orders     None

## 2022-05-03 NOTE — ED Notes (Signed)
Patient transported to CT 
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
## 2022-05-04 ENCOUNTER — Encounter (HOSPITAL_COMMUNITY): Payer: Self-pay | Admitting: Internal Medicine

## 2022-05-04 ENCOUNTER — Other Ambulatory Visit: Payer: Self-pay

## 2022-05-04 DIAGNOSIS — K7460 Unspecified cirrhosis of liver: Secondary | ICD-10-CM

## 2022-05-04 DIAGNOSIS — E669 Obesity, unspecified: Secondary | ICD-10-CM | POA: Diagnosis present

## 2022-05-04 DIAGNOSIS — G9341 Metabolic encephalopathy: Secondary | ICD-10-CM

## 2022-05-04 DIAGNOSIS — N179 Acute kidney failure, unspecified: Secondary | ICD-10-CM | POA: Diagnosis present

## 2022-05-04 DIAGNOSIS — U071 COVID-19: Principal | ICD-10-CM

## 2022-05-04 DIAGNOSIS — J42 Unspecified chronic bronchitis: Secondary | ICD-10-CM

## 2022-05-04 DIAGNOSIS — K7682 Hepatic encephalopathy: Secondary | ICD-10-CM

## 2022-05-04 DIAGNOSIS — G894 Chronic pain syndrome: Secondary | ICD-10-CM | POA: Diagnosis present

## 2022-05-04 DIAGNOSIS — D638 Anemia in other chronic diseases classified elsewhere: Secondary | ICD-10-CM | POA: Diagnosis present

## 2022-05-04 DIAGNOSIS — Z515 Encounter for palliative care: Secondary | ICD-10-CM | POA: Diagnosis not present

## 2022-05-04 DIAGNOSIS — D696 Thrombocytopenia, unspecified: Secondary | ICD-10-CM

## 2022-05-04 DIAGNOSIS — E119 Type 2 diabetes mellitus without complications: Secondary | ICD-10-CM | POA: Diagnosis present

## 2022-05-04 DIAGNOSIS — X58XXXA Exposure to other specified factors, initial encounter: Secondary | ICD-10-CM | POA: Diagnosis present

## 2022-05-04 DIAGNOSIS — E871 Hypo-osmolality and hyponatremia: Secondary | ICD-10-CM | POA: Diagnosis present

## 2022-05-04 DIAGNOSIS — Z79899 Other long term (current) drug therapy: Secondary | ICD-10-CM | POA: Diagnosis not present

## 2022-05-04 DIAGNOSIS — W19XXXA Unspecified fall, initial encounter: Secondary | ICD-10-CM | POA: Diagnosis present

## 2022-05-04 DIAGNOSIS — I5042 Chronic combined systolic (congestive) and diastolic (congestive) heart failure: Secondary | ICD-10-CM | POA: Diagnosis present

## 2022-05-04 DIAGNOSIS — D689 Coagulation defect, unspecified: Secondary | ICD-10-CM | POA: Diagnosis present

## 2022-05-04 DIAGNOSIS — Z885 Allergy status to narcotic agent status: Secondary | ICD-10-CM | POA: Diagnosis not present

## 2022-05-04 DIAGNOSIS — E785 Hyperlipidemia, unspecified: Secondary | ICD-10-CM | POA: Diagnosis present

## 2022-05-04 DIAGNOSIS — D61818 Other pancytopenia: Secondary | ICD-10-CM | POA: Diagnosis present

## 2022-05-04 DIAGNOSIS — E7220 Disorder of urea cycle metabolism, unspecified: Secondary | ICD-10-CM | POA: Diagnosis not present

## 2022-05-04 DIAGNOSIS — K766 Portal hypertension: Secondary | ICD-10-CM | POA: Diagnosis present

## 2022-05-04 DIAGNOSIS — F17210 Nicotine dependence, cigarettes, uncomplicated: Secondary | ICD-10-CM | POA: Diagnosis present

## 2022-05-04 DIAGNOSIS — I110 Hypertensive heart disease with heart failure: Secondary | ICD-10-CM | POA: Diagnosis present

## 2022-05-04 DIAGNOSIS — Z6841 Body Mass Index (BMI) 40.0 and over, adult: Secondary | ICD-10-CM | POA: Diagnosis not present

## 2022-05-04 DIAGNOSIS — Y929 Unspecified place or not applicable: Secondary | ICD-10-CM | POA: Diagnosis not present

## 2022-05-04 DIAGNOSIS — K7581 Nonalcoholic steatohepatitis (NASH): Secondary | ICD-10-CM | POA: Diagnosis present

## 2022-05-04 DIAGNOSIS — J449 Chronic obstructive pulmonary disease, unspecified: Secondary | ICD-10-CM | POA: Diagnosis present

## 2022-05-04 DIAGNOSIS — S01101A Unspecified open wound of right eyelid and periocular area, initial encounter: Secondary | ICD-10-CM | POA: Diagnosis present

## 2022-05-04 LAB — PROCALCITONIN: Procalcitonin: 0.11 ng/mL

## 2022-05-04 LAB — URINALYSIS, ROUTINE W REFLEX MICROSCOPIC
Bilirubin Urine: NEGATIVE
Glucose, UA: NEGATIVE mg/dL
Hgb urine dipstick: NEGATIVE
Ketones, ur: NEGATIVE mg/dL
Leukocytes,Ua: NEGATIVE
Nitrite: NEGATIVE
Protein, ur: NEGATIVE mg/dL
Specific Gravity, Urine: 1.008 (ref 1.005–1.030)
pH: 6 (ref 5.0–8.0)

## 2022-05-04 LAB — COMPREHENSIVE METABOLIC PANEL
ALT: 24 U/L (ref 0–44)
AST: 41 U/L (ref 15–41)
Albumin: 2.4 g/dL — ABNORMAL LOW (ref 3.5–5.0)
Alkaline Phosphatase: 120 U/L (ref 38–126)
Anion gap: 9 (ref 5–15)
BUN: 47 mg/dL — ABNORMAL HIGH (ref 8–23)
CO2: 26 mmol/L (ref 22–32)
Calcium: 8.6 mg/dL — ABNORMAL LOW (ref 8.9–10.3)
Chloride: 103 mmol/L (ref 98–111)
Creatinine, Ser: 1.79 mg/dL — ABNORMAL HIGH (ref 0.61–1.24)
GFR, Estimated: 41 mL/min — ABNORMAL LOW (ref >60)
Glucose, Bld: 89 mg/dL (ref 70–99)
Potassium: 4.1 mmol/L (ref 3.5–5.1)
Sodium: 138 mmol/L (ref 135–145)
Total Bilirubin: 2.2 mg/dL — ABNORMAL HIGH (ref 0.3–1.2)
Total Protein: 7.7 g/dL (ref 6.5–8.1)

## 2022-05-04 LAB — CK: Total CK: 332 U/L (ref 49–397)

## 2022-05-04 LAB — CBC WITH DIFFERENTIAL/PLATELET
Abs Immature Granulocytes: 0.03 10*3/uL (ref 0.00–0.07)
Basophils Absolute: 0 10*3/uL (ref 0.0–0.1)
Basophils Relative: 1 %
Eosinophils Absolute: 0.1 10*3/uL (ref 0.0–0.5)
Eosinophils Relative: 2 %
HCT: 34.6 % — ABNORMAL LOW (ref 39.0–52.0)
Hemoglobin: 11.6 g/dL — ABNORMAL LOW (ref 13.0–17.0)
Immature Granulocytes: 1 %
Lymphocytes Relative: 17 %
Lymphs Abs: 0.9 10*3/uL (ref 0.7–4.0)
MCH: 33.6 pg (ref 26.0–34.0)
MCHC: 33.5 g/dL (ref 30.0–36.0)
MCV: 100.3 fL — ABNORMAL HIGH (ref 80.0–100.0)
Monocytes Absolute: 0.7 10*3/uL (ref 0.1–1.0)
Monocytes Relative: 13 %
Neutro Abs: 3.8 10*3/uL (ref 1.7–7.7)
Neutrophils Relative %: 66 %
Platelets: 46 10*3/uL — ABNORMAL LOW (ref 150–400)
RBC: 3.45 MIL/uL — ABNORMAL LOW (ref 4.22–5.81)
RDW: 19.9 % — ABNORMAL HIGH (ref 11.5–15.5)
WBC: 5.6 10*3/uL (ref 4.0–10.5)
nRBC: 0 % (ref 0.0–0.2)

## 2022-05-04 LAB — BRAIN NATRIURETIC PEPTIDE: B Natriuretic Peptide: 27.5 pg/mL (ref 0.0–100.0)

## 2022-05-04 LAB — AMMONIA: Ammonia: 118 umol/L — ABNORMAL HIGH (ref 9–35)

## 2022-05-04 LAB — RESP PANEL BY RT-PCR (FLU A&B, COVID) ARPGX2
Influenza A by PCR: NEGATIVE
Influenza B by PCR: NEGATIVE
SARS Coronavirus 2 by RT PCR: POSITIVE — AB

## 2022-05-04 LAB — RAPID URINE DRUG SCREEN, HOSP PERFORMED
Amphetamines: NOT DETECTED
Barbiturates: NOT DETECTED
Benzodiazepines: POSITIVE — AB
Cocaine: NOT DETECTED
Opiates: NOT DETECTED
Tetrahydrocannabinol: POSITIVE — AB

## 2022-05-04 LAB — TSH: TSH: 1.313 u[IU]/mL (ref 0.350–4.500)

## 2022-05-04 LAB — MAGNESIUM: Magnesium: 2.2 mg/dL (ref 1.7–2.4)

## 2022-05-04 LAB — SODIUM, URINE, RANDOM: Sodium, Ur: 60 mmol/L

## 2022-05-04 LAB — CREATININE, URINE, RANDOM: Creatinine, Urine: 62 mg/dL

## 2022-05-04 LAB — PROTIME-INR
INR: 1.4 — ABNORMAL HIGH (ref 0.8–1.2)
Prothrombin Time: 16.6 seconds — ABNORMAL HIGH (ref 11.4–15.2)

## 2022-05-04 LAB — C-REACTIVE PROTEIN: CRP: 3.3 mg/dL — ABNORMAL HIGH (ref <1.0)

## 2022-05-04 MED ORDER — TIOTROPIUM BROMIDE MONOHYDRATE 2.5 MCG/ACT IN AERS: 2.0000 | INHALATION_SPRAY | Freq: Every day | RESPIRATORY_TRACT | Status: DC

## 2022-05-04 MED ORDER — ACETAMINOPHEN 650 MG RE SUPP: 650.0000 mg | Freq: Four times a day (QID) | RECTAL | Status: DC | PRN

## 2022-05-04 MED ORDER — ALBUTEROL SULFATE (2.5 MG/3ML) 0.083% IN NEBU: 2.5000 mg | INHALATION_SOLUTION | RESPIRATORY_TRACT | Status: DC | PRN

## 2022-05-04 MED ORDER — LACTATED RINGERS IV SOLN: INTRAVENOUS | Status: DC

## 2022-05-04 MED ORDER — PANTOPRAZOLE SODIUM 40 MG PO TBEC
40.0000 mg | DELAYED_RELEASE_TABLET | Freq: Every day | ORAL | Status: DC
  Administered 2022-05-04 – 2022-05-09 (×6): 40 mg via ORAL
  Filled 2022-05-04 (×6): qty 1

## 2022-05-04 MED ORDER — FENTANYL 50 MCG/HR TD PT72
1.0000 | MEDICATED_PATCH | TRANSDERMAL | Status: DC
  Administered 2022-05-04: 1 via TRANSDERMAL
  Filled 2022-05-04 (×3): qty 1

## 2022-05-04 MED ORDER — LACTULOSE 10 GM/15ML PO SOLN
30.0000 g | Freq: Four times a day (QID) | ORAL | Status: DC
  Administered 2022-05-04 – 2022-05-08 (×12): 30 g via ORAL
  Filled 2022-05-04: qty 60
  Filled 2022-05-04 (×12): qty 45

## 2022-05-04 MED ORDER — LACTULOSE 10 GM/15ML PO SOLN
30.0000 g | Freq: Once | ORAL | Status: DC
  Filled 2022-05-04: qty 60

## 2022-05-04 MED ORDER — BACITRACIN-POLYMYXIN B 500-10000 UNIT/GM OP OINT
1.0000 | TOPICAL_OINTMENT | Freq: Three times a day (TID) | OPHTHALMIC | Status: DC
  Administered 2022-05-04 – 2022-05-06 (×6): 1 via OPHTHALMIC
  Filled 2022-05-04 (×2): qty 3.5

## 2022-05-04 MED ORDER — ACETAMINOPHEN 325 MG PO TABS
650.0000 mg | ORAL_TABLET | Freq: Four times a day (QID) | ORAL | Status: DC | PRN
  Administered 2022-05-05 – 2022-05-08 (×2): 650 mg via ORAL
  Filled 2022-05-04 (×2): qty 2

## 2022-05-04 MED ORDER — LACTATED RINGERS IV SOLN: INTRAVENOUS | Status: AC

## 2022-05-04 MED ORDER — UMECLIDINIUM BROMIDE 62.5 MCG/ACT IN AEPB
1.0000 | INHALATION_SPRAY | Freq: Every day | RESPIRATORY_TRACT | Status: DC
  Administered 2022-05-07 – 2022-05-09 (×3): 1 via RESPIRATORY_TRACT
  Filled 2022-05-04 (×2): qty 7

## 2022-05-04 MED ORDER — FERROUS SULFATE 325 (65 FE) MG PO TABS
325.0000 mg | ORAL_TABLET | ORAL | Status: DC
  Administered 2022-05-04 – 2022-05-08 (×3): 325 mg via ORAL
  Filled 2022-05-04 (×5): qty 1

## 2022-05-04 MED ORDER — MOLNUPIRAVIR EUA 200MG CAPSULE
4.0000 | ORAL_CAPSULE | Freq: Two times a day (BID) | ORAL | Status: AC
  Administered 2022-05-04 – 2022-05-09 (×10): 800 mg via ORAL
  Filled 2022-05-04 (×2): qty 4

## 2022-05-04 MED ORDER — FENTANYL 75 MCG/HR TD PT72: 1.0000 | MEDICATED_PATCH | TRANSDERMAL | Status: DC

## 2022-05-04 NOTE — Progress Notes (Signed)
[facility] ED 09 [facility]) Hospital Liaison Note    [name] is a current hospice patient with a terminal diagnosis of end stage NASH cirrhosis of liver. Patient was brought to [name] ED by his wife due to confusion and altered mental status. Patient was evaluated in the [facility] ED and was admitted for acute metabolic encephalopathy and Covid 19 infection. Per Dr. Karie Georges, [facility] MD, this is a related hospital admission.    Visited [name] at bedside. Patient was alert but confused. IV fluids running. Patient still in the ED during my visit. Patient is currently on Isolation for COVID 19. Report exchanged with RN. Spoke with [facility] home team SW, who stated that wife has been working on applying for [employer] and would want to look for placement. Spoke to wife, [name] via telephone to provide an update. [name] stated that she will be dropping off the paperwork for [facility] tomorrow morning.    Patient is inpatient appropriate due to treatment for metabolic encephalopathy and IV fluids.    V/S: 127/80 bp, 98.3 Temp, 109 bpm, 18 RR, 100% on RA  I/O: Not recorded.   Labs:  Glucose: 107 (H) BUN: 50 (H) Creatinine: 2.37 (H) Calcium: 8.6 (L) Alkaline Phosphatase: 133 (H) Albumin: 2.5 (L) Total Protein: 8.3 (H) Total Bilirubin: 2.1 (H) GFR, Estimated: 29 (L) RBC: 3.67 (L) Hemoglobin: 12.2 (L) HCT: 37.0 (L) MCV: 100.8 (H) RDW: 19.8 (H) Platelets: 48 (L) Prothrombin Time: 16.7 (H) INR: 1.4 (H) pH, Ven: 7.479 (H) pCO2, Ven: 34.7 (L) pO2, Ven: 46 (H) Calcium Ionized: 1.01 (L) Hemoglobin: 12.9 (L) HCT: 38.0 (L) Ammonia: 165 (H)  Diagnostics:  CT of abdomen/pelvis IMPRESSION: Cirrhosis with sequela of portal hypertension. Chronic pancreatitis. Unchanged suprarenal abdominal aortic aneurysm measuring 3.7 cm Aortic Atherosclerosis (ICD10-I70.0).  CT of Head IMPRESSION: 1. No acute intracranial abnormality. 2. Mild right frontal scalp soft tissue swelling. 3.  Generalized cerebral atrophy with chronic white matter small vessel ischemic changes.  IV/PRN:  lactated ringers infusion Rate: 50 mL/hr Freq: Continuous Route: IV x1  lactated ringers infusion Rate: 75 mL/hr Freq: Continuous Route: IV x1  Problem list: Principal Problem:   Acute metabolic encephalopathy Active Problems:   Cirrhosis of liver not due to alcohol (HCC)   Chronic combined systolic and diastolic heart failure (HCC)   COPD (chronic obstructive pulmonary disease) (HCC)   Thrombocytopenia (HCC)   Chronic pain syndrome   Anemia of chronic disease   AKI (acute kidney injury) ([place])   COVID-19 virus infection   #) Acute metabolic encephalopathy: 2 days of progressive confusion relative to baseline mental status, which is suspected to be multifactorial in etiology, with potential contributions from hepatic encephalopathy, given the presenting ammonia level is the highest it has ever been, per chart review, with additional potential metabolic contributions from acute kidney injury as well as resulting increased risk for confusion from toxic encephalopathy as a consequence of home central acting medications that are renally metabolized/cleared, likely resulting in increasing circulating value of these medications further metabolites as a consequence of interval acute kidney injury.  Specifically, these medications include oxycodone with its nephrotoxic metabolites, gabapentin.  Additionally, urinary drug screen noted to be positive for THC.  Also, potential metabolic contribution from finding of COVID positive PCR, notable after the patient was recently diagnosed with COVID infection.  Otherwise, no overt evidence of underlying infectious process, including urinalysis that was inconsistent with UTI.  Also check chest x-ray.  Abdomen not particularly tender to suggest any contribution from  underlying SBP.  In the context of a documented history of COPD, also check VBG to evaluate for any  contribution from hypercapnic encephalopathy.   Plan: Advance lactulose dosing to 30 g p.o. every 6 hours, titrated to produce 2-3 soft stools per day after production of initial loose stool, noting the patient had initial dose of lactulose 2 hours ago.  Further evaluation management of presenting acute kidney injury, as further detailed below.  Hold home central acting medications for now, including gabapentin and short acting oxycodone.  Further evaluation management of presenting COVID-19 infection, as further detailed below.  Check VBG, chest x-ray, CPK, TSH.  Fall precautions ordered.   #) Acute Kidney Injury:  as quantified above.  This appears to be prerenal in nature, with differential including dehydration versus hepatorenal applications in the context of cirrhosis.  However, clinically, differential appears to favor contribution from dehydration at this time, noting that in the same interval in which the patient's renal function is worsened, his total bilirubin is showed some level of improvement.  Additionally, in reviewing his CBC trend, there is interval increase in all 3 cell lines relative to most recent prior, suggesting an element of hemoconcentration due to dehydration.  From both a diagnostic/therapeutic standpoint we will initiate very gentle IV fluids tonight, with close monitoring for ensuing renal trend/response to this measure, as further detailed below.  We will hold next doses of home diuretic regimen in the process.   Additional potential contributions towards presenting AKI include pharmacologic possibilities, including buildup of nephrotoxic metabolites of oxycodone given interval decline in her renal clearance, as well as similar contribution from gabapentin.  Of note, urinalysis showed no evidence of overt urinary casts.  No evidence of postrenal obstructive process on today CT abdomen/pelvis.     Plan: monitor strict I's & O's and daily weights. Attempt to avoid nephrotoxic  agents.  Hold home gabapentin and oxycodone for now.  Refrain from NSAIDs. Repeat CMP in the morning. Check serum magnesium level.  Gentle IV fluids in the form of lactated Ringer's at 50 cc/h x 6 hours.  Add-on random urine sodium and random urine creatinine.  Add on CPK level.  Hold home diuretic medications for now.   #) COVID-19 Infection: in the setting of presenting 2 days of worsening confusion, with positive COVID-19 PCR result in the ED today.  This is notable in the context of patient's wife having recently been diagnosed with COVID infection on November 2021, with the patient having close interval contact with his wife. no evidence of acute hypoxia to warrant initiation of systemic corticosteroids. However, in the context of increased risk for progression of severity of COVID-19 infection in the setting of patient's age and multiple comorbidities, including chronic liver disease, COPD, and given that patient's symptoms started less than 5 days ago, criteria appear to be met for initiation of anti-viral therapy.  In reviewing the contraindications for molnupiravir, there does not appear to be a contraindication for this medication in the setting of liver failure.  In the absence of leukocytosis or objective fever, presentation does not meet criteria for sepsis.     Plan: Airborne and contact precautions. Monitor continuous pulse ox and monitor on telemetry. prn supplemental O2 to maintain O2 sats greater than or equal to 94%. Proning protocol initiated. PRN albuterol nebulizer. PRN acetaminophen for fever. Start Molnupiravir, as above. Refraining from systemic corticosteroids for now, as above. Check and trend CRP. Check serum mag and phos levels. Check CMP/CBC in the AM.  Flutter valve and incentive spirometry. Check procalcitonin, which, if non-elevated in the context of the pro inflammatory state associated with COVID-19, would provide a high degree of negative predictive value that would further  decrease the likelihood of any contribution from bacterial pneumonia. Check chest x-ray, VBG.    #) Cirrhosis: Documented history of such, on the basis of Nash. Complicated by portal hypertension, for which the patient is on spironolactone and Lasix in 5 2 ratio, as further quantified above.  Complicated by chronic thrombocytopenia as well as hepatic encephalopathy, as further detailed above .  Total bilirubin trending down relative to most recent prior.  INR 1.4. Presenting MELD score: 22, conferring an estimated 3 month mortality of  19.6%.     As described above, there is clinical suspicion for dehydration resulting in acute kidney injury as opposed to hepatorenal implications.  We will hold next dose of home diuretic medications, will resume.  Gentle IV fluids with close monitoring of interval response and renal function, as above.  Concern for an element of hepatic encephalopathy contributing to the patient's 2-day course of progressive confusion, with lactulose appearing to be at an all-time high value, which will prompt escalation of lactulose intervention, as further detailed below. Plan: Monitor strict I's and O's and daily weights.  Repeat CMP, CBC, and INR in the AM. Check serum mag level.  Hold next dose of spironolactone/Lasix, as above.  Lactulose 30 g p.o. every 6 hours titrated reduced to 3 soft stools per day, first dose now.   #) COPD: Documented history thereof, without clinical evidence of acute exacerbation at this time.   Outpatient respiratory regimen includes the following: Spiriva.    Plan: cont outpatient Spiriva. Prn albuterol nebulizer. Check CMP and serum magnesium level in the AM.  In the context of presenting acute encephalopathy, will also check VBG to evaluate for any contribution from hypercapnic encephalopathy.   #) Chronic pain syndrome: Documented history of such, with outpatient pain regimen that includes every 72 hour fentanyl patches as well as prn short acting  oxycodone in addition to scheduled gabapentin.  Given interval worsening renal function, there is concern for buildup of nephrotoxic metabolites of oxycodone, with additional implications of diminished renal clearance of gabapentin, all in the context of presenting acute encephalopathy.  Consequently, we will hold home gabapentin and oxycodone for now, noting likely residual analgesic benefits of prior doses of these medications given diminished renal clearance thereof.  We will continue fentanyl patch, ensuring that prior is removed before next dose is initiated this morning.  We will refrain from NSAIDs in the setting of cirrhosis.   Plan: Hold him gabapentin, oxycodone.  Continue home Duragesic fentanyl patches, as above.  Further evaluation management of presenting AKI, as above.   #) Anemia of chronic disease: Documented history of such, a/w with baseline hgb range 9-12, with presenting hgb consistent with this range, in the absence of any overt evidence of active bleed.  On scheduled oral iron supplementation at home.   Plan: Repeat CBC in the morning.  Continue outpatient oral iron supplementation.  INR in the morning.   #) Chronic pancytopenia: As a consequence of cirrhosis, with presenting platelet count trending up slightly relative to most recent prior.  No evidence of active bleed.  Not on a blood thinners as an outpatient.   Plan: Refraining from pharmacologic DVT prophylaxis.  Repeat CBC in the morning.   #) Chronic systolic/diastolic heart failure: Documented history of such, with most recent prior echocardiogram occurring  in May 2020, which is notable for LVEF 35 to 40% as well as finding of diastolic dysfunction, with normal right ventricular systolic function.  Clinically, presentation appears less suggestive of acute heart failure exacerbation.  Rather, there appears to be an element of intravascular depletion due to dehydration, as further detailed above.  Will check chest x-ray and  BNP to further evaluate, and will hold next doses of diuretics will presumed.  Gentle IV fluids with close monitoring of ensuing renal function response, as above.   Plan: Monitor strict I's and O's and daily weights.  Hold next dose of home spironolactone/Lasix, as above.  Add on BNP.  Check chest x-ray.  Repeat CMP in the morning.    GOC: Patient is currently a Full Code.    D/C planning: Ongoing.     Family: Spoke with wife, via telephone.   IDT: Updated  Transfer summary and Medication list placed in patient's chart.    Zigmund Gottron, RN [facility] Liaison  [phone]

## 2022-05-04 NOTE — ED Notes (Signed)
Pts fentanyl patch removed with MD at bedside

## 2022-05-04 NOTE — ED Notes (Signed)
Urine collected at this time

## 2022-05-04 NOTE — Progress Notes (Addendum)
PROGRESS NOTE    [name]  IFO:[id number] DOB: 1956-02-18 DOA: 05/03/2022 PCP: Ria Bush, MD   Chief Complaint  Patient presents with   Altered Mental Status    Brief Narrative:   This is a no charge note as patient was admitted earlier today by Dr. Nadara Mustard her, chart, imaging and labs were reviewed, patient was seen and examined   [name] is a 66 y.o. male with medical history significant for cirrhosis secondary to [facility], for which the patient is on hospice, complicated by portal hypertension, hepatic encephalopathy, COPD, chronic pain syndrome, anemia of chronic disease, chronic systolic/diastolic heart failure, chronic thrombocytopenia, who is admitted to [facility] on 05/03/2022 with acute encephalopathy after presenting from home to [facility] ED for evaluation of altered mental status. Patient's wife reportedly diagnosed with COVID-19 infection on November 2021. The patient has been in close contact with her since that time.  Workup significant for hyperammonemia, COVID-19 positive. -patient is under [facility] care hospice care, he remains a full code.  Assessment & Plan:   Principal Problem:   Acute metabolic encephalopathy Active Problems:   Cirrhosis of liver not due to alcohol (HCC)   Chronic combined systolic and diastolic heart failure (HCC)   COPD (chronic obstructive pulmonary disease) (HCC)   Thrombocytopenia (HCC)   Chronic pain syndrome   Anemia of chronic disease   AKI (acute kidney injury) ([place])   COVID-19 virus infection   Acute metabolic encephalopathy  Acute hepatic encephalopathy Hyperammonemia - multifactorial, due to hepatic encephalopathy/hyperammonemia, COVID-19 infection as well -Decrease his fentanyl dosing from 75 mcg patch to 50 mcg patch -He remains significantly altered this morning.  AKI -Improving, continue with IV fluids, but remains significantly elevated at 1.79 -Psych medications  COVID-19  infection -no hypoxia, no indication for steroids, but high risk, so we will continue with molnupiravir  Liver cirrhosis Hyperammonemia Coagulopathy Chronic thrombocytopenia -With history of Nash, complicated by portal hypertension. -Elevated INR at 1.4 -Increase lactulose dosing given significantly elevated ammonia level -hold Aldactone/Lasix due to worsening renal function   COPD -No wheezing, no indication for steroids, continue with Spiriva, as needed albuterol  Chronic pain syndrome  -Decrease dose of fentanyl patch given altered mental status  chronic pancytopenia Anemia of chronic disease -Baseline, continue to monitor closely, so far no indication for transfusion -Continue with SCD for DVT prophylaxis, avoiding pharmacologic DVT prophylaxis   Chronic systolic/diastolic CHF - most recent prior echocardiogram occurring in May 2020, which is notable for LVEF 35 to 40% as well as finding of diastolic dysfunction, with normal right ventricular systolic function  -Appears to be on the dry side, hold diuresis, continue with IV fluids  Obesity  Body mass index is 44.6 kg/m.     Patient with right eyelash wound, will start on topical antibiotics.   DVT prophylaxis: SCD Code Status: (Full) Family Communication: None at bedside Disposition:   Status is: Need to be changed to inpatient given he remains altered, still with elevated ammonia level, and renal function not back to baseline, he remains on IV fluids Consultants:  None  Subjective:  No significant events as discussed with staff, he remains altered, review of system unreliable  Objective: Vitals:   05/04/22 0831 05/04/22 0915 05/04/22 0930 05/04/22 0945  BP:  113/60 (!) 105/59 129/66  Pulse: (!) 108 (!) 104 (!) 104 98  Resp: 15 16 20  (!) 25  Temp: 98.3 F (36.8 C)     TempSrc: Oral     SpO2:  93% 98% 96% 98%   No intake or output data in the 24 hours ending 05/04/22 1006 There were no vitals filed  for this visit.  Examination:  Awake, confused Symmetrical Chest wall movement, Good air movement bilaterally, CTAB RRR,No Gallops,Rubs or new Murmurs, No Parasternal Heave +ve B.Sounds, Abd Soft, No tenderness, No rebound - guarding or rigidity. No Cyanosis, Clubbing or edema, No new Rash or bruise      Data Reviewed: I have personally reviewed following labs and imaging studies  CBC: Recent Labs  Lab 05/03/22 2123 05/03/22 2132 05/04/22 0532  WBC 6.6  --  5.6  NEUTROABS 4.7  --  3.8  HGB 12.2* 12.9* 11.6*  HCT 37.0* 38.0* 34.6*  MCV 100.8*  --  100.3*  PLT 48*  --  46*    Basic Metabolic Panel: Recent Labs  Lab 05/03/22 2123 05/03/22 2132 05/04/22 0800  NA 135 137 138  K 4.1 4.2 4.1  CL 98  --  103  CO2 25  --  26  GLUCOSE 107*  --  89  BUN 50*  --  47*  CREATININE 2.37*  --  1.79*  CALCIUM 8.6*  --  8.6*  MG  --   --  2.2    GFR: CrCl cannot be calculated (Unknown ideal weight.).  Liver Function Tests: Recent Labs  Lab 05/03/22 2123 05/04/22 0800  AST 39 41  ALT 23 24  ALKPHOS 133* 120  BILITOT 2.1* 2.2*  PROT 8.3* 7.7  ALBUMIN 2.5* 2.4*    CBG: No results for input(s): "GLUCAP" in the last 168 hours.   Recent Results (from the past 240 hour(s))  Resp Panel by RT-PCR (Flu A&B, Covid) Anterior Nasal Swab     Status: Abnormal   Collection Time: 05/04/22  2:01 AM   Specimen: Anterior Nasal Swab  Result Value Ref Range Status   SARS Coronavirus 2 by RT PCR POSITIVE (A) NEGATIVE Final    Comment: (NOTE) SARS-CoV-2 target nucleic acids are DETECTED.  The SARS-CoV-2 RNA is generally detectable in upper respiratory specimens during the acute phase of infection. Positive results are indicative of the presence of the identified virus, but do not rule out bacterial infection or co-infection with other pathogens not detected by the test. Clinical correlation with patient history and other diagnostic information is necessary to determine  patient infection status. The expected result is Negative.  Fact Sheet for Patients: EntrepreneurPulse.com.au  Fact Sheet for Healthcare Providers: IncredibleEmployment.be  This test is not yet approved or cleared by the [place] FDA and  has been authorized for detection and/or diagnosis of SARS-CoV-2 by FDA under an Emergency Use Authorization (EUA).  This EUA will remain in effect (meaning this test can be used) for the duration of  the COVID-19 declaration under Section 564(b)(1) of the A ct, 21 U.S.C. section 360bbb-3(b)(1), unless the authorization is terminated or revoked sooner.     Influenza A by PCR NEGATIVE NEGATIVE Final   Influenza B by PCR NEGATIVE NEGATIVE Final    Comment: (NOTE) The Xpert Xpress SARS-CoV-2/FLU/RSV plus assay is intended as an aid in the diagnosis of influenza from Nasopharyngeal swab specimens and should not be used as a sole basis for treatment. Nasal washings and aspirates are unacceptable for Xpert Xpress SARS-CoV-2/FLU/RSV testing.  Fact Sheet for Patients: EntrepreneurPulse.com.au  Fact Sheet for Healthcare Providers: IncredibleEmployment.be  This test is not yet approved or cleared by the [place] FDA and has been authorized for detection and/or diagnosis of  SARS-CoV-2 by FDA under an Emergency Use Authorization (EUA). This EUA will remain in effect (meaning this test can be used) for the duration of the COVID-19 declaration under Section 564(b)(1) of the Act, 21 U.S.C. section 360bbb-3(b)(1), unless the authorization is terminated or revoked.  Performed at [facility], [place] [address] [place], [place] [address]          Radiology Studies: CT ABDOMEN PELVIS WO CONTRAST  Result Date: 05/04/2022 CLINICAL DATA:  Confusion.  End-stage liver disease.  Abdominal pain EXAM: CT ABDOMEN AND PELVIS WITHOUT CONTRAST TECHNIQUE: Multidetector CT  imaging of the abdomen and pelvis was performed following the standard protocol without IV contrast. RADIATION DOSE REDUCTION: This exam was performed according to the departmental dose-optimization program which includes automated exposure control, adjustment of the mA and/or kV according to patient size and/or use of iterative reconstruction technique. COMPARISON:  CT abdomen and pelvis 01/31/2022 FINDINGS: Lower chest: No acute abnormality. Hepatobiliary: Nodular liver contour compatible with cirrhosis. Unremarkable gallbladder. Similar dilation of the common bile duct measuring 1.2 cm. Pancreas: No acute abnormality. Punctate calcifications in the pancreatic head. No pancreatic ductal dilatation. Spleen: Splenomegaly. Adrenals/Urinary Tract: Normal adrenal glands. No urinary calculi or hydronephrosis. Unremarkable bladder. Stomach/Bowel: Normal caliber large and small bowel. No bowel wall thickening. Normal appendix. Unremarkable stomach. Vascular/Lymphatic: Aortic atherosclerosis. Suprarenal abdominal aortic aneurysm measuring 3.7 cm. Aorto bi-iliac stent. The stents extend into the bilateral renal arteries. Stent patency can not be assessed on noncontrast exam. Upper abdominal varices. Similar prominent aortocaval and inguinal lymph nodes. Reproductive: Prostate is unremarkable. Other: No free intraperitoneal fluid or air. Musculoskeletal: Right THA. Compression fracture deformity of L4 is unchanged. IMPRESSION: Cirrhosis with sequela of portal hypertension. Chronic pancreatitis. Unchanged suprarenal abdominal aortic aneurysm measuring 3.7 cm Aortic Atherosclerosis (ICD10-I70.0). Electronically Signed   By: Placido Sou M.D.   On: 05/04/2022 00:17   CT HEAD WO CONTRAST (5MM)  Result Date: 05/04/2022 CLINICAL DATA:  Altered mental status. EXAM: CT HEAD WITHOUT CONTRAST TECHNIQUE: Contiguous axial images were obtained from the base of the skull through the vertex without intravenous contrast. RADIATION  DOSE REDUCTION: This exam was performed according to the departmental dose-optimization program which includes automated exposure control, adjustment of the mA and/or kV according to patient size and/or use of iterative reconstruction technique. COMPARISON:  April 15, 2022 FINDINGS: Brain: There is mild cerebral atrophy with widening of the extra-axial spaces and ventricular dilatation. There are areas of decreased attenuation within the white matter tracts of the supratentorial brain, consistent with microvascular disease changes. Vascular: No hyperdense vessel or unexpected calcification. Skull: Normal. Negative for fracture or focal lesion. Sinuses/Orbits: No acute finding. Other: Mild right frontal scalp soft tissue swelling is seen. IMPRESSION: 1. No acute intracranial abnormality. 2. Mild right frontal scalp soft tissue swelling. 3. Generalized cerebral atrophy with chronic white matter small vessel ischemic changes. Electronically Signed   By: Virgina Norfolk M.D.   On: 05/04/2022 00:08        Scheduled Meds:  fentaNYL  1 patch Transdermal Q72H   ferrous sulfate  325 mg Oral QODAY   lactulose  30 g Oral Once   lactulose  30 g Oral Q6H   molnupiravir EUA  4 capsule Oral BID   Tiotropium Bromide Monohydrate  2 puff Inhalation Daily   Continuous Infusions:  lactated ringers 75 mL/hr at 05/04/22 1005     LOS: 0 days       Phillips Climes, MD [facility]   To contact the  attending provider between 7A-7P or the covering provider during after hours 7P-7A, please log into the web site [email or web address] and access using universal [place] password for that web site. If you do not have the password, please call the hospital operator.  05/04/2022, 10:06 AM

## 2022-05-04 NOTE — ED Notes (Signed)
Repeat labs sent to lab

## 2022-05-04 NOTE — H&P (Signed)
History and Physical      [name] QJF:[id number] DOB: 04/07/1956 DOA: 05/03/2022  PCP: Ria Bush, MD  Patient coming from: home   I have personally briefly reviewed patient's old medical records in [place]  Chief Complaint: Altered mental status  HPI: [name] is a 66 y.o. male with medical history significant for cirrhosis secondary to [facility], for which the patient is on hospice, complicated by portal hypertension, hepatic encephalopathy, COPD, chronic pain syndrome, anemia of chronic disease, chronic systolic/diastolic heart failure, chronic thrombocytopenia, who is admitted to [facility] on 05/03/2022 with acute encephalopathy after presenting from home to [facility] ED for evaluation of altered mental status.  In the context of the patient's altered mental status, the following history is provided via my discussions with the EDP, in addition to [facility] hospice representative, and via chart review, after EDP attempted multiple times to contact patient's wife, unsuccessfully reaching her.  The patient presents to [facility] emergency department this evening for further evaluation of altered mental status at the request of his wife, who he was reportedly noted 2 days of progressive confusion.  In this context, the wife reportedly conveyed that she is unable to care for the patient at home given his current degree of confusion, even with assistance from home hospice does not appear that she wants the patient to go to hospice house at this time, but rather prefers hospitalization for evaluation/management of potential reversible causes of his acute encephalopathy, with goal to improve the patient's degree of confusion to the point where patient's wife is able to provide adequate care to the patient at home.   EDP contacted the on-call call [facility] hospice representative, notifying them of this hospitalization.  The hospice representative conveyed that the  patient's current CODE STATUS is full.   Patient's wife reportedly diagnosed with COVID-19 infection on November 2021.  The patient has been in close contact with her since that time.  Per chart review, prior liver enzymes were notable for the following: Alkaline phosphatase [phone], relative to a high value of 210 noted in February 2023; total bilirubin was most recently prior noted to be 2.6 on 04/16/2022.  Additionally, per chart review, most recent prior ammonia level was noted to be 112 on 04/17/2022, representing the highest prior ammonia level on file.  Ammonia also noted to be 44 in August 2023.  In the setting of the patient's Karlene Lineman cirrhosis complicated by portal hypertension, he is on Lasix 40 mg p.o. twice daily as well as spironolactone 100 mg p.o. twice daily.  He is also on lactulose 20 g 3 times daily.  He has associated chronic pain syndrome, with outpatient opioid regimen that includes prn short acting oxycodone, every 72 hour fentanyl patches, in addition to scheduled gabapentin.     ED Course:  Vital signs in the ED were notable for the following: 200 9.1; heart rate 79-94; blood pressure 116/60; respiratory rate 18-22, oxygen saturation 97 to 98% on room air.  Labs were notable for the following: CMP notable for the following: Bicarbonate 25, BUN 50, creatinine 2.37 compared to most recent prior value 0.99 on 04/17/2022, glucose 107, calcium, adjusted for mild hypoalbuminemia noted to be 9.8, avidin 2.5, alkaline phosphatase 133, total bilirubin 2.1, AST 39, ALT 23.  Ammonia 165.  CBC notable for white blood cell count 6600, hemoglobin 12.2 compared to most recent prior value of 9.3 on 04/17/2022, platelet count 48 compared to 34 on 04/17/2022.  INR 1.4.  Urinalysis notable for no white blood cells, no red blood cells.  Urinary drug screen is positive for THC and benzodiazepines.  COVID-19 PCR positive.  Imaging and additional notable ED work-up: CT head, per formal  radiology read, showed no evidence of acute intracranial process.  CT abdomen/pelvis without contrast, per formal radiology read showed cirrhosis with unchanged sequela of chronic portal hypertension, also showing unremarkable gallbladder, without evidence of acute cholecystitis, and showed unchanged dilation of common bile duct 1.2 cm, without evidence of choledocholithiasis.  Overall, CT abdomen/pelvis showed no evidence of acute intra-abdominal or acute intrapelvic process.  While in the ED, the following were administered: Lactulose 30 g p.o. x1.  Subsequently, the patient was admitted for further evaluation management of acute encephalopathy, with presentation also notable for acute kidney injury as well as COVID-19 infection. That    Review of Systems: As per HPI otherwise 10 point review of systems negative.   Past Medical History:  Diagnosis Date   AAA (abdominal aortic aneurysm) ([place]) 09/2012--  monitored by dr Trula Slade   stable 5.6cm CTA abdomen 2016   Abnormal drug screen 07/09/2016   1/2/018 - positive oxycodone, fentanyl, inapprop positive MJ - mod risk   Allergic rhinitis    Ascites 03/2019   B12 deficiency    CAD (coronary artery disease) cardiologist-  dr Stanford Breed   x3 with stents last 2005, EF 40%, predominantly RCA by CT 2016   Cataracts, bilateral    Cervical spondylosis 05/2010   s/p surgery   Charcot Lelan Pons Tooth muscular atrophy dx  1975   neurologist--  dr love--  type 2 per pt   Chronic pain syndrome    [facility] with [facility] --> disagreement and transfered care to Dr Sanjuan Dame at [facility] pain clinic [facility], requests PCP write Rx but f/u with pain clinic Q6-12 months   COPD (chronic obstructive pulmonary disease) ([place]) 10/2011   minimal by PFTs   DDD (degenerative disc disease)    Disturbances of sensation of smell and taste    improving   Dyspnea on exertion    GERD (gastroesophageal reflux disease)    Gout    Headache    Hepatitis     hepatitis B   Hidradenitis    right groin   Hidradenitis suppurativa dx 2011   goin and leg crease   followd by Lyndle Herrlich - daily bactrim, s/p intralesional steroid injection 10/2010   Hip osteoarthritis    s/p intraarticular steroid shot (12/2012) ([place])   History of hepatitis B 1983   History of MI (myocardial infarction)    2000  &  2005   History of pneumonia    History of viral meningitis 2000   HLD (hyperlipidemia)    HTN (hypertension)    Ischemic cardiomyopathy    s/p inferior MI  --  current ef per myoview 39%   Liver cirrhosis secondary to NASH ([place]) 01/2014   by CT scan, rec virtual colonoscopy by Dr Collene Mares 06/2014   Lumbar herniated disc    Myocardial infarction ([place])    x2   Nocturia more than twice per night    Obesity    Spinal stenosis    released from [place].  established with [facility] (07/2013)   T2DM (type 2 diabetes mellitus) ([place])    ABIs WNL 2016   Vitamin D deficiency     Past Surgical History:  Procedure Laterality Date   ABDOMINAL AORTIC ENDOVASCULAR FENESTRATED STENT GRAFT N/A 11/30/2015   Procedure: ABDOMINAL AORTIC  ENDOVASCULAR FENESTRATED STENT GRAFT;  Surgeon: Serafina Mitchell, MD;  Location: [facility] OR;  Service: Vascular;  Laterality: N/A;   ANTERIOR CERVICAL DECOMP/DISCECTOMY FUSION  01-07-2010    C4 -- C7   CARDIAC CATHETERIZATION  03-30-2005  dr Albertine Patricia   ef 40% w/ inferior akinesis/  LM and CFX angiographically normal/  pLAD 30%/   Widely patent stents in RCA and PDA widely patent   CARDIOVASCULAR STRESS TEST  10-23-2012  dr Stanford Breed   No ischemia/  Moderate scar in the inferior wall, otherwise normal perfusion/  LV ef 39%,  LV wall motion: inferior/ inferolateral hypokinesis   COLONOSCOPY  05/06/2007   normal, small int hemorrhoids rpt 5 yrs due to fmhx - rec against rpt colonoscopy by Dr Collene Mares   CORONARY ANGIOPLASTY  2000  dr Stanford Breed   PCI to RCA and PDA   CORONARY ANGIOPLASTY WITH STENT PLACEMENT  03-19-2005  dr Gwyndolyn Saxon downey    inferior STEMI--- DES x4 to RCA w/ balloon angioplasty and balloon angioplasty to jailed PDA ostium/  severe hypokinesis of midinferor wall, ef 50%/  dLM 20%,  mLAD 20%,  dCFX 60%   ESOPHAGOGASTRODUODENOSCOPY  01/2017   dilated benign esophageal stenosis, portal hypertensive gastropathy Henrene Pastor)   ESOPHAGOGASTRODUODENOSCOPY (EGD) WITH PROPOFOL N/A 02/20/2018   benign biopsy Vicente Males, Bailey Mech, MD)   ESOPHAGOGASTRODUODENOSCOPY (EGD) WITH PROPOFOL N/A 05/12/2019   Procedure: ESOPHAGOGASTRODUODENOSCOPY (EGD) WITH PROPOFOL;  Surgeon: [name], MD;  Location: [facility] ENDOSCOPY;  Service: Gastroenterology;  Laterality: N/A;   HYDRADENITIS EXCISION Right 12/31/2014   Procedure: WIDE EXCISION HIDRADENITIS GROIN; Coralie Keens, MD   IR PARACENTESIS  03/25/2019   LUMBAR [place] SURGERY     L5-S1   LUMBAR LAMINECTOMY  05-18-2010   L2--5   laminectomy/foraminotomy for stenosis ([place])   MYELOGRAM     L5-S1 and L1-2 spondylosis   SACROILIAC JOINT INJECTION Bilateral 10/2013   [place]   TONSILLECTOMY AND ADENOIDECTOMY  1972    Social History:  reports that he has been smoking cigarettes and e-cigarettes. He started smoking about 54 years ago. He has a 57.00 pack-year smoking history. He has never used smokeless tobacco. He reports current drug use. Drugs: Fentanyl and Hydrocodone. He reports that he does not drink alcohol.   Allergies  Allergen Reactions   Statins Shortness Of Breath    Cough, trouble breathing Cough, trouble breathing   Losartan Other (See Comments)    Causes him to have pain   Wellbutrin [Bupropion] Other (See Comments)    Worsened mood - crying   Allopurinol Nausea Only   Baclofen Nausea And Vomiting   Penicillins Nausea And Vomiting    Did it involve swelling of the face/tongue/throat, SOB, or low BP? N/A Did it involve sudden or severe rash/hives, skin peeling, or any reaction on the inside of your mouth or nose? N/A Did you need to seek medical attention at a hospital or doctor's  office? N/A When did it last happen? Child     If all above answers are "NO", may proceed with cephalosporin use.   Tramadol Nausea Only    Family History  Problem Relation Age of Onset   Cancer Mother        colon   Diabetes Mother    Kidney disease Mother    Aneurysm Mother        AAA   Rheum arthritis Mother    Charcot-Marie-Tooth disease Mother    Heart disease Mother        before age 54  Cancer Father        skin   Heart attack Father    Heart disease Father        before age 77   Cancer Brother        skin   Coronary artery disease Brother    Cancer Brother        small cell lung cancer   Aneurysm Brother        AAA   Rheum arthritis Sister    Rheum arthritis Brother    Prostate cancer Neg Hx    Bladder Cancer Neg Hx    Kidney cancer Neg Hx       Prior to Admission medications   Medication Sig Start Date End Date Taking? Authorizing Provider  albuterol (PROVENTIL) (2.5 MG/3ML) 0.083% nebulizer solution USE 1 VIAL PER NEBULIZER EVERY 6 HRS AS NEEDED FOR WHEEZING 12/03/20   Ria Bush, MD  albuterol (VENTOLIN HFA) 108 (90 Base) MCG/ACT inhaler TAKE 2 PUFFS BY MOUTH EVERY 6 HOURS AS NEEDED FOR WHEEZE OR SHORTNESS OF BREATH 12/14/21   Ria Bush, MD  ARIPiprazole (ABILIFY) 5 MG tablet Take 1 tablet (5 mg total) by mouth daily. 10/04/21   Ria Bush, MD  B Complex-C (SUPER B COMPLEX PO) Take 1 tablet by mouth daily.    [provider]  Ca Phosphate-Cholecalciferol [phone] MG-UNIT TABS Take 1 tablet by mouth daily.  04/29/16   [provider]  cholecalciferol 2000 units TABS Take 2,000 Units by mouth daily. 04/29/16   Ria Bush, MD  desvenlafaxine (PRISTIQ) 25 MG 24 hr tablet TAKE 1 TABLET (25 MG TOTAL) BY MOUTH DAILY. 04/11/22   Ria Bush, MD  fentaNYL (DURAGESIC) 75 MCG/HR Place 1 patch onto the skin every 3 (three) days. Patient taking differently: Place 1 patch onto the skin every 3 (three) days. Patch applied  yesterday 03/15/22   Ria Bush, MD  ferrous sulfate 325 (65 FE) MG tablet Take 1 tablet (325 mg total) by mouth every other day. 04/29/19   Ria Bush, MD  fluticasone [facility]) 50 MCG/ACT nasal spray Place 2 sprays into both nostrils daily as needed for allergies.  02/09/12   Ria Bush, MD  folic acid (FOLVITE) 1 MG tablet TAKE 1 TABLET BY MOUTH EVERY DAY 11/07/21   Ria Bush, MD  furosemide (LASIX) 40 MG tablet Take 1 tablet (40 mg total) by mouth 2 (two) times daily. 01/04/22   Ria Bush, MD  gabapentin (NEURONTIN) 300 MG capsule Take 1 capsule (300 mg total) by mouth in the morning and at bedtime. 06/27/21   Ria Bush, MD  Lactulose 20 GM/30ML SOLN Take 30 mLs (20 g total) by mouth 3 (three) times daily. 04/17/22 07/16/22  Enzo Bi, MD  Misc Natural Products [facility] ADVANCED) CAPS Take 1 capsule by mouth 2 (two) times daily.    [provider]  nicotine (NICODERM CQ - DOSED IN MG/24 HOURS) 14 mg/24hr patch APPLY 1 PATCH ONTO THE SKIN EVERY DAY 02/09/22   Ria Bush, MD  nitroGLYCERIN (NITROSTAT) 0.4 MG SL tablet Place 1 tablet (0.4 mg total) under the tongue every 5 (five) minutes as needed for chest pain. 10/10/12   Lelon Perla, MD  nystatin cream (MYCOSTATIN) APPLY TO AFFECTED AREA TWICE A DAY 07/19/21   Billey Co, MD  omeprazole (PRILOSEC) 40 MG capsule TAKE 1 CAPSULE BY MOUTH EVERY DAY 12/14/21   Ria Bush, MD  ondansetron (ZOFRAN) 4 MG tablet TAKE 1 TABLET BY MOUTH EVERY 8 HOURS AS  NEEDED FOR NAUSEA AND VOMITING 01/09/22   [name], MD  oxyCODONE (OXY IR/ROXICODONE) 5 MG immediate release tablet Take 1 tablet (5 mg total) by mouth every 4 (four) hours as needed for severe pain. 04/12/22   Ria Bush, MD  Red Yeast Rice 600 MG CAPS Take 2 capsules by mouth daily.    [provider]  spironolactone (ALDACTONE) 100 MG tablet Take 1 tablet (100 mg total) by mouth 2 (two) times daily. 02/03/22   Ria Bush, MD  Tiotropium Bromide Monohydrate (SPIRIVA RESPIMAT) 2.5 MCG/ACT AERS Inhale 2 puffs into the lungs daily. 10/04/21   Ria Bush, MD  vitamin B-12 (CYANOCOBALAMIN) 500 MCG tablet Take 1 tablet (500 mcg total) by mouth every Monday, Wednesday, and Friday. 12/27/18   Ria Bush, MD     Objective    Physical Exam: Vitals:   05/03/22 2043  BP: 116/60  Pulse: 79  Resp: 18  Temp: 99.1 F (37.3 C)  SpO2: 98%    General: appears to be stated age; alert, confused Skin: warm, dry, no rash Head:  AT/[place] Mouth:  Oral mucosa membranes appear dry, normal dentition Neck: supple; trachea midline Heart:  RRR; did not appreciate any M/R/G Lungs: CTAB, did not appreciate any wheezes, rales, or rhonchi Abdomen: + BS; soft, ND, NT Vascular: 2+ pedal pulses b/l; 2+ radial pulses b/l Extremities:  2+ edema in the bilateral lower extremities, no muscle wasting Neuro: strength and sensation intact in upper and lower extremities b/l    Labs on Admission: I have personally reviewed following labs and imaging studies  CBC: Recent Labs  Lab 05/03/22 2123 05/03/22 2132  WBC 6.6  --   NEUTROABS 4.7  --   HGB 12.2* 12.9*  HCT 37.0* 38.0*  MCV 100.8*  --   PLT 48*  --    Basic Metabolic Panel: Recent Labs  Lab 05/03/22 2123 05/03/22 2132  NA 135 137  K 4.1 4.2  CL 98  --   CO2 25  --   GLUCOSE 107*  --   BUN 50*  --   CREATININE 2.37*  --   CALCIUM 8.6*  --    GFR: CrCl cannot be calculated (Unknown ideal weight.). Liver Function Tests: Recent Labs  Lab 05/03/22 2123  AST 39  ALT 23  ALKPHOS 133*  BILITOT 2.1*  PROT 8.3*  ALBUMIN 2.5*   Recent Labs  Lab 05/03/22 2123  LIPASE 41   Recent Labs  Lab 05/03/22 2120  AMMONIA 165*   Coagulation Profile: Recent Labs  Lab 05/03/22 2123  INR 1.4*   Cardiac Enzymes: No results for input(s): "CKTOTAL", "CKMB", "CKMBINDEX", "TROPONINI" in the last 168 hours. BNP (last 3 results) No results for  input(s): "PROBNP" in the last 8760 hours. HbA1C: No results for input(s): "HGBA1C" in the last 72 hours. CBG: No results for input(s): "GLUCAP" in the last 168 hours. Lipid Profile: No results for input(s): "CHOL", "HDL", "LDLCALC", "TRIG", "CHOLHDL", "LDLDIRECT" in the last 72 hours. Thyroid Function Tests: No results for input(s): "TSH", "T4TOTAL", "FREET4", "T3FREE", "THYROIDAB" in the last 72 hours. Anemia Panel: No results for input(s): "VITAMINB12", "FOLATE", "FERRITIN", "TIBC", "IRON", "RETICCTPCT" in the last 72 hours. Urine analysis:    Component Value Date/Time   COLORURINE YELLOW 05/04/2022 0027   APPEARANCEUR HAZY (A) 05/04/2022 0027   APPEARANCEUR Hazy (A) 12/18/2019 1145   LABSPEC 1.008 05/04/2022 0027   PHURINE 6.0 05/04/2022 0027   GLUCOSEU NEGATIVE 05/04/2022 0027   HGBUR NEGATIVE 05/04/2022 0027  BILIRUBINUR NEGATIVE 05/04/2022 0027   BILIRUBINUR 1+ 12/13/2021 1214   BILIRUBINUR Negative 12/18/2019 [place] 05/04/2022 0027   PROTEINUR NEGATIVE 05/04/2022 0027   UROBILINOGEN 4.0 (A) 12/13/2021 1214   UROBILINOGEN 1.0 03/01/2008 1803   NITRITE NEGATIVE 05/04/2022 0027   LEUKOCYTESUR NEGATIVE 05/04/2022 0027    Radiological Exams on Admission: CT ABDOMEN PELVIS WO CONTRAST  Result Date: 05/04/2022 CLINICAL DATA:  Confusion.  End-stage liver disease.  Abdominal pain EXAM: CT ABDOMEN AND PELVIS WITHOUT CONTRAST TECHNIQUE: Multidetector CT imaging of the abdomen and pelvis was performed following the standard protocol without IV contrast. RADIATION DOSE REDUCTION: This exam was performed according to the departmental dose-optimization program which includes automated exposure control, adjustment of the mA and/or kV according to patient size and/or use of iterative reconstruction technique. COMPARISON:  CT abdomen and pelvis 01/31/2022 FINDINGS: Lower chest: No acute abnormality. Hepatobiliary: Nodular liver contour compatible with cirrhosis.  Unremarkable gallbladder. Similar dilation of the common bile duct measuring 1.2 cm. Pancreas: No acute abnormality. Punctate calcifications in the pancreatic head. No pancreatic ductal dilatation. Spleen: Splenomegaly. Adrenals/Urinary Tract: Normal adrenal glands. No urinary calculi or hydronephrosis. Unremarkable bladder. Stomach/Bowel: Normal caliber large and small bowel. No bowel wall thickening. Normal appendix. Unremarkable stomach. Vascular/Lymphatic: Aortic atherosclerosis. Suprarenal abdominal aortic aneurysm measuring 3.7 cm. Aorto bi-iliac stent. The stents extend into the bilateral renal arteries. Stent patency can not be assessed on noncontrast exam. Upper abdominal varices. Similar prominent aortocaval and inguinal lymph nodes. Reproductive: Prostate is unremarkable. Other: No free intraperitoneal fluid or air. Musculoskeletal: Right THA. Compression fracture deformity of L4 is unchanged. IMPRESSION: Cirrhosis with sequela of portal hypertension. Chronic pancreatitis. Unchanged suprarenal abdominal aortic aneurysm measuring 3.7 cm Aortic Atherosclerosis (ICD10-I70.0). Electronically Signed   By: Placido Sou M.D.   On: 05/04/2022 00:17   CT HEAD WO CONTRAST (5MM)  Result Date: 05/04/2022 CLINICAL DATA:  Altered mental status. EXAM: CT HEAD WITHOUT CONTRAST TECHNIQUE: Contiguous axial images were obtained from the base of the skull through the vertex without intravenous contrast. RADIATION DOSE REDUCTION: This exam was performed according to the departmental dose-optimization program which includes automated exposure control, adjustment of the mA and/or kV according to patient size and/or use of iterative reconstruction technique. COMPARISON:  April 15, 2022 FINDINGS: Brain: There is mild cerebral atrophy with widening of the extra-axial spaces and ventricular dilatation. There are areas of decreased attenuation within the white matter tracts of the supratentorial brain, consistent with  microvascular disease changes. Vascular: No hyperdense vessel or unexpected calcification. Skull: Normal. Negative for fracture or focal lesion. Sinuses/Orbits: No acute finding. Other: Mild right frontal scalp soft tissue swelling is seen. IMPRESSION: 1. No acute intracranial abnormality. 2. Mild right frontal scalp soft tissue swelling. 3. Generalized cerebral atrophy with chronic white matter small vessel ischemic changes. Electronically Signed   By: Virgina Norfolk M.D.   On: 05/04/2022 00:08      Assessment/Plan   Principal Problem:   Acute metabolic encephalopathy Active Problems:   Cirrhosis of liver not due to alcohol (HCC)   Chronic combined systolic and diastolic heart failure (HCC)   COPD (chronic obstructive pulmonary disease) (HCC)   Thrombocytopenia (HCC)   Chronic pain syndrome   Anemia of chronic disease   AKI (acute kidney injury) ([place])   COVID-19 virus infection      #) Acute metabolic encephalopathy: 2 days of progressive confusion relative to baseline mental status, which is suspected to be multifactorial in etiology, with potential contributions from hepatic encephalopathy,  given the presenting ammonia level is the highest it has ever been, per chart review, with additional potential metabolic contributions from acute kidney injury as well as resulting increased risk for confusion from toxic encephalopathy as a consequence of home central acting medications that are renally metabolized/cleared, likely resulting in increasing circulating value of these medications further metabolites as a consequence of interval acute kidney injury.  Specifically, these medications include oxycodone with its nephrotoxic metabolites, gabapentin.  Additionally, urinary drug screen noted to be positive for THC.  Also, potential metabolic contribution from finding of COVID positive PCR, notable after the patient was recently diagnosed with COVID infection.  Otherwise, no overt evidence of  underlying infectious process, including urinalysis that was inconsistent with UTI.  Also check chest x-ray.  Abdomen not particularly tender to suggest any contribution from underlying SBP.  In the context of a documented history of COPD, also check VBG to evaluate for any contribution from hypercapnic encephalopathy.  Plan: Advance lactulose dosing to 30 g p.o. every 6 hours, titrated to produce 2-3 soft stools per day after production of initial loose stool, noting the patient had initial dose of lactulose 2 hours ago.  Further evaluation management of presenting acute kidney injury, as further detailed below.  Hold home central acting medications for now, including gabapentin and short acting oxycodone.  Further evaluation management of presenting COVID-19 infection, as further detailed below.  Check VBG, chest x-ray, CPK, TSH.  Fall precautions ordered.         #) Acute Kidney Injury:  as quantified above.  This appears to be prerenal in nature, with differential including dehydration versus hepatorenal applications in the context of cirrhosis.  However, clinically, differential appears to favor contribution from dehydration at this time, noting that in the same interval in which the patient's renal function is worsened, his total bilirubin is showed some level of improvement.  Additionally, in reviewing his CBC trend, there is interval increase in all 3 cell lines relative to most recent prior, suggesting an element of hemoconcentration due to dehydration.  From both a diagnostic/therapeutic standpoint we will initiate very gentle IV fluids tonight, with close monitoring for ensuing renal trend/response to this measure, as further detailed below.  We will hold next doses of home diuretic regimen in the process.  Additional potential contributions towards presenting AKI include pharmacologic possibilities, including buildup of nephrotoxic metabolites of oxycodone given interval decline in her renal  clearance, as well as similar contribution from gabapentin.  Of note, urinalysis showed no evidence of overt urinary casts.  No evidence of postrenal obstructive process on today CT abdomen/pelvis.   Plan: monitor strict I's & O's and daily weights. Attempt to avoid nephrotoxic agents.  Hold home gabapentin and oxycodone for now.  Refrain from NSAIDs. Repeat CMP in the morning. Check serum magnesium level.  Gentle IV fluids in the form of lactated Ringer's at 50 cc/h x 6 hours.  Add-on random urine sodium and random urine creatinine.  Add on CPK level.  Hold home diuretic medications for now.            #) COVID-19 Infection: in the setting of presenting 2 days of worsening confusion, with positive COVID-19 PCR result in the ED today.  This is notable in the context of patient's wife having recently been diagnosed with COVID infection on November 2021, with the patient having close interval contact with his wife. no evidence of acute hypoxia to warrant initiation of systemic corticosteroids. However, in the context of  increased risk for progression of severity of COVID-19 infection in the setting of patient's age and multiple comorbidities, including chronic liver disease, COPD, and given that patient's symptoms started less than 5 days ago, criteria appear to be met for initiation of anti-viral therapy.  In reviewing the contraindications for molnupiravir, there does not appear to be a contraindication for this medication in the setting of liver failure.  In the absence of leukocytosis or objective fever, presentation does not meet criteria for sepsis.   Plan: Airborne and contact precautions. Monitor continuous pulse ox and monitor on telemetry. prn supplemental O2 to maintain O2 sats greater than or equal to 94%. Proning protocol initiated. PRN albuterol nebulizer. PRN acetaminophen for fever. Start Molnupiravir, as above. Refraining from systemic corticosteroids for now, as above. Check and  trend CRP. Check serum mag and phos levels. Check CMP/CBC in the AM. Flutter valve and incentive spirometry. Check procalcitonin, which, if non-elevated in the context of the pro inflammatory state associated with COVID-19, would provide a high degree of negative predictive value that would further decrease the likelihood of any contribution from bacterial pneumonia. Check chest x-ray, VBG.           #) Cirrhosis: Documented history of such, on the basis of [place]. Complicated by portal hypertension, for which the patient is on spironolactone and Lasix in 5 2 ratio, as further quantified above.  Complicated by chronic thrombocytopenia as well as hepatic encephalopathy, as further detailed above .  Total bilirubin trending down relative to most recent prior.  INR 1.4. Presenting MELD score: 22, conferring an estimated 3 month mortality of  19.6%.    As described above, there is clinical suspicion for dehydration resulting in acute kidney injury as opposed to hepatorenal implications.  We will hold next dose of home diuretic medications, will resume.  Gentle IV fluids with close monitoring of interval response and renal function, as above.  Concern for an element of hepatic encephalopathy contributing to the patient's 2-day course of progressive confusion, with lactulose appearing to be at an all-time high value, which will prompt escalation of lactulose intervention, as further detailed below.    Plan: Monitor strict I's and O's and daily weights.  Repeat CMP, CBC, and INR in the AM. Check serum mag level.  Hold next dose of spironolactone/Lasix, as above.  Lactulose 30 g p.o. every 6 hours titrated reduced to 3 soft stools per day, first dose now.           #) COPD: Documented history thereof, without clinical evidence of acute exacerbation at this time.   Outpatient respiratory regimen includes the following: Spiriva.   Plan: cont outpatient Spiriva. Prn albuterol nebulizer. Check CMP and  serum magnesium level in the AM.  In the context of presenting acute encephalopathy, will also check VBG to evaluate for any contribution from hypercapnic encephalopathy.             #) Chronic pain syndrome: Documented history of such, with outpatient pain regimen that includes every 72 hour fentanyl patches as well as prn short acting oxycodone in addition to scheduled gabapentin.  Given interval worsening renal function, there is concern for buildup of nephrotoxic metabolites of oxycodone, with additional implications of diminished renal clearance of gabapentin, all in the context of presenting acute encephalopathy.  Consequently, we will hold home gabapentin and oxycodone for now, noting likely residual analgesic benefits of prior doses of these medications given diminished renal clearance thereof.  We will continue fentanyl patch, ensuring  that prior is removed before next dose is initiated this morning.  We will refrain from NSAIDs in the setting of cirrhosis.  Plan: Hold him gabapentin, oxycodone.  Continue home Duragesic fentanyl patches, as above.  Further evaluation management of presenting AKI, as above.             #) Anemia of chronic disease: Documented history of such, a/w with baseline hgb range 9-12, with presenting hgb consistent with this range, in the absence of any overt evidence of active bleed.  On scheduled oral iron supplementation at home.   Plan: Repeat CBC in the morning.  Continue outpatient oral iron supplementation.  INR in the morning.            #) Chronic pancytopenia: As a consequence of cirrhosis, with presenting platelet count trending up slightly relative to most recent prior.  No evidence of active bleed.  Not on a blood thinners as an outpatient.  Plan: Refraining from pharmacologic DVT prophylaxis.  Repeat CBC in the morning.           #) Chronic systolic/diastolic heart failure: Documented history of such, with most  recent prior echocardiogram occurring in May 2020, which is notable for LVEF 35 to 40% as well as finding of diastolic dysfunction, with normal right ventricular systolic function.  Clinically, presentation appears less suggestive of acute heart failure exacerbation.  Rather, there appears to be an element of intravascular depletion due to dehydration, as further detailed above.  Will check chest x-ray and BNP to further evaluate, and will hold next doses of diuretics will presumed.  Gentle IV fluids with close monitoring of ensuing renal function response, as above.  Plan: Monitor strict I's and O's and daily weights.  Hold next dose of home spironolactone/Lasix, as above.  Add on BNP.  Check chest x-ray.  Repeat CMP in the morning.      DVT prophylaxis: SCD's   Code Status: Full code (confirmed via [facility] representative, as further detailed above) Disposition Plan: Per Rounding Team Consults called: EDP discussed the patient's case with the on-call [facility] hospice representative, as further detailed above;  Admission status: Observation     I SPENT GREATER THAN 75  MINUTES IN CLINICAL CARE TIME/MEDICAL DECISION-MAKING IN COMPLETING THIS ADMISSION.      [place] DO [facility]  From [place]   05/04/2022, 1:34 AM

## 2022-05-04 NOTE — ED Notes (Signed)
Patient remains confused to situation but following commands.

## 2022-05-04 NOTE — Progress Notes (Deleted)
History and Physical      [name] QJF:[id number] DOB: 04/07/1956 DOA: 05/03/2022  PCP: Ria Bush, MD  Patient coming from: home   I have personally briefly reviewed patient's old medical records in [place]  Chief Complaint: Altered mental status  HPI: [name] is a 66 y.o. male with medical history significant for cirrhosis secondary to [facility], for which the patient is on hospice, complicated by portal hypertension, hepatic encephalopathy, COPD, chronic pain syndrome, anemia of chronic disease, chronic systolic/diastolic heart failure, chronic thrombocytopenia, who is admitted to [facility] on 05/03/2022 with acute encephalopathy after presenting from home to [facility] ED for evaluation of altered mental status.  In the context of the patient's altered mental status, the following history is provided via my discussions with the EDP, in addition to [facility] hospice representative, and via chart review, after EDP attempted multiple times to contact patient's wife, unsuccessfully reaching her.  The patient presents to [facility] emergency department this evening for further evaluation of altered mental status at the request of his wife, who he was reportedly noted 2 days of progressive confusion.  In this context, the wife reportedly conveyed that she is unable to care for the patient at home given his current degree of confusion, even with assistance from home hospice does not appear that she wants the patient to go to hospice house at this time, but rather prefers hospitalization for evaluation/management of potential reversible causes of his acute encephalopathy, with goal to improve the patient's degree of confusion to the point where patient's wife is able to provide adequate care to the patient at home.   EDP contacted the on-call call [facility] hospice representative, notifying them of this hospitalization.  The hospice representative conveyed that the  patient's current CODE STATUS is full.   Patient's wife reportedly diagnosed with COVID-19 infection on November 2021.  The patient has been in close contact with her since that time.  Per chart review, prior liver enzymes were notable for the following: Alkaline phosphatase [phone], relative to a high value of 210 noted in February 2023; total bilirubin was most recently prior noted to be 2.6 on 04/16/2022.  Additionally, per chart review, most recent prior ammonia level was noted to be 112 on 04/17/2022, representing the highest prior ammonia level on file.  Ammonia also noted to be 44 in August 2023.  In the setting of the patient's Karlene Lineman cirrhosis complicated by portal hypertension, he is on Lasix 40 mg p.o. twice daily as well as spironolactone 100 mg p.o. twice daily.  He is also on lactulose 20 g 3 times daily.  He has associated chronic pain syndrome, with outpatient opioid regimen that includes prn short acting oxycodone, every 72 hour fentanyl patches, in addition to scheduled gabapentin.     ED Course:  Vital signs in the ED were notable for the following: 200 9.1; heart rate 79-94; blood pressure 116/60; respiratory rate 18-22, oxygen saturation 97 to 98% on room air.  Labs were notable for the following: CMP notable for the following: Bicarbonate 25, BUN 50, creatinine 2.37 compared to most recent prior value 0.99 on 04/17/2022, glucose 107, calcium, adjusted for mild hypoalbuminemia noted to be 9.8, avidin 2.5, alkaline phosphatase 133, total bilirubin 2.1, AST 39, ALT 23.  Ammonia 165.  CBC notable for white blood cell count 6600, hemoglobin 12.2 compared to most recent prior value of 9.3 on 04/17/2022, platelet count 48 compared to 34 on 04/17/2022.  INR 1.4.  Urinalysis notable for no white blood cells, no red blood cells.  Urinary drug screen is positive for THC and benzodiazepines.  COVID-19 PCR positive.  Imaging and additional notable ED work-up: CT head, per formal  radiology read, showed no evidence of acute intracranial process.  CT abdomen/pelvis without contrast, per formal radiology read showed cirrhosis with unchanged sequela of chronic portal hypertension, also showing unremarkable gallbladder, without evidence of acute cholecystitis, and showed unchanged dilation of common bile duct 1.2 cm, without evidence of choledocholithiasis.  Overall, CT abdomen/pelvis showed no evidence of acute intra-abdominal or acute intrapelvic process.  While in the ED, the following were administered: Lactulose 30 g p.o. x1.  Subsequently, the patient was admitted for further evaluation management of acute encephalopathy, with presentation also notable for acute kidney injury as well as COVID-19 infection. That    Review of Systems: As per HPI otherwise 10 point review of systems negative.   Past Medical History:  Diagnosis Date   AAA (abdominal aortic aneurysm) ([place]) 09/2012--  monitored by dr Trula Slade   stable 5.6cm CTA abdomen 2016   Abnormal drug screen 07/09/2016   1/2/018 - positive oxycodone, fentanyl, inapprop positive MJ - mod risk   Allergic rhinitis    Ascites 03/2019   B12 deficiency    CAD (coronary artery disease) cardiologist-  dr Stanford Breed   x3 with stents last 2005, EF 40%, predominantly RCA by CT 2016   Cataracts, bilateral    Cervical spondylosis 05/2010   s/p surgery   Charcot Lelan Pons Tooth muscular atrophy dx  1975   neurologist--  dr love--  type 2 per pt   Chronic pain syndrome    [facility] with [facility] --> disagreement and transfered care to Dr Sanjuan Dame at [facility] pain clinic [facility], requests PCP write Rx but f/u with pain clinic Q6-12 months   COPD (chronic obstructive pulmonary disease) ([place]) 10/2011   minimal by PFTs   DDD (degenerative disc disease)    Disturbances of sensation of smell and taste    improving   Dyspnea on exertion    GERD (gastroesophageal reflux disease)    Gout    Headache    Hepatitis     hepatitis B   Hidradenitis    right groin   Hidradenitis suppurativa dx 2011   goin and leg crease   followd by Lyndle Herrlich - daily bactrim, s/p intralesional steroid injection 10/2010   Hip osteoarthritis    s/p intraarticular steroid shot (12/2012) ([place])   History of hepatitis B 1983   History of MI (myocardial infarction)    2000  &  2005   History of pneumonia    History of viral meningitis 2000   HLD (hyperlipidemia)    HTN (hypertension)    Ischemic cardiomyopathy    s/p inferior MI  --  current ef per myoview 39%   Liver cirrhosis secondary to NASH ([place]) 01/2014   by CT scan, rec virtual colonoscopy by Dr Collene Mares 06/2014   Lumbar herniated disc    Myocardial infarction ([place])    x2   Nocturia more than twice per night    Obesity    Spinal stenosis    released from [place].  established with [facility] (07/2013)   T2DM (type 2 diabetes mellitus) ([place])    ABIs WNL 2016   Vitamin D deficiency     Past Surgical History:  Procedure Laterality Date   ABDOMINAL AORTIC ENDOVASCULAR FENESTRATED STENT GRAFT N/A 11/30/2015   Procedure: ABDOMINAL AORTIC  ENDOVASCULAR FENESTRATED STENT GRAFT;  Surgeon: Vance W Brabham, MD;  Location: [facility] OR;  Service: Vascular;  Laterality: N/A;   ANTERIOR CERVICAL DECOMP/DISCECTOMY FUSION  01-07-2010    C4 -- C7   CARDIAC CATHETERIZATION  03-30-2005  dr downey   ef 40% w/ inferior akinesis/  LM and CFX angiographically normal/  pLAD 30%/   Widely patent stents in RCA and PDA widely patent   CARDIOVASCULAR STRESS TEST  10-23-2012  dr crenshaw   No ischemia/  Moderate scar in the inferior wall, otherwise normal perfusion/  LV ef 39%,  LV wall motion: inferior/ inferolateral hypokinesis   COLONOSCOPY  05/06/2007   normal, small int hemorrhoids rpt 5 yrs due to fmhx - rec against rpt colonoscopy by Dr Mann   CORONARY ANGIOPLASTY  2000  dr crenshaw   PCI to RCA and PDA   CORONARY ANGIOPLASTY WITH STENT PLACEMENT  03-19-2005  dr william downey    inferior STEMI--- DES x4 to RCA w/ balloon angioplasty and balloon angioplasty to jailed PDA ostium/  severe hypokinesis of midinferor wall, ef 50%/  dLM 20%,  mLAD 20%,  dCFX 60%   ESOPHAGOGASTRODUODENOSCOPY  01/2017   dilated benign esophageal stenosis, portal hypertensive gastropathy ([place])   ESOPHAGOGASTRODUODENOSCOPY (EGD) WITH PROPOFOL N/A 02/20/2018   benign biopsy (Anna, Kiran, MD)   ESOPHAGOGASTRODUODENOSCOPY (EGD) WITH PROPOFOL N/A 05/12/2019   Procedure: ESOPHAGOGASTRODUODENOSCOPY (EGD) WITH PROPOFOL;  Surgeon: Anna, Kiran, MD;  Location: [facility] ENDOSCOPY;  Service: Gastroenterology;  Laterality: N/A;   HYDRADENITIS EXCISION Right 12/31/2014   Procedure: WIDE EXCISION HIDRADENITIS GROIN; Douglas Blackman, MD   IR PARACENTESIS  03/25/2019   LUMBAR DISC SURGERY     L5-S1   LUMBAR LAMINECTOMY  05-18-2010   L2--5   laminectomy/foraminotomy for stenosis ([place])   MYELOGRAM     L5-S1 and L1-2 spondylosis   SACROILIAC JOINT INJECTION Bilateral 10/2013   [place]   TONSILLECTOMY AND ADENOIDECTOMY  1972    Social History:  reports that he has been smoking cigarettes and e-cigarettes. He started smoking about 54 years ago. He has a 57.00 pack-year smoking history. He has never used smokeless tobacco. He reports current drug use. Drugs: Fentanyl and Hydrocodone. He reports that he does not drink alcohol.   Allergies  Allergen Reactions   Statins Shortness Of Breath    Cough, trouble breathing Cough, trouble breathing   Losartan Other (See Comments)    Causes him to have pain   Wellbutrin [Bupropion] Other (See Comments)    Worsened mood - crying   Allopurinol Nausea Only   Baclofen Nausea And Vomiting   Penicillins Nausea And Vomiting    Did it involve swelling of the face/tongue/throat, SOB, or low BP? N/A Did it involve sudden or severe rash/hives, skin peeling, or any reaction on the inside of your mouth or nose? N/A Did you need to seek medical attention at a hospital or doctor's  office? N/A When did it last happen? Child     If all above answers are "NO", may proceed with cephalosporin use.   Tramadol Nausea Only    Family History  Problem Relation Age of Onset   Cancer Mother        colon   Diabetes Mother    Kidney disease Mother    Aneurysm Mother        AAA   Rheum arthritis Mother    Charcot-Marie-Tooth disease Mother    Heart disease Mother        before age 60     Cancer Father        skin   Heart attack Father    Heart disease Father        before age 77   Cancer Brother        skin   Coronary artery disease Brother    Cancer Brother        small cell lung cancer   Aneurysm Brother        AAA   Rheum arthritis Sister    Rheum arthritis Brother    Prostate cancer Neg Hx    Bladder Cancer Neg Hx    Kidney cancer Neg Hx       Prior to Admission medications   Medication Sig Start Date End Date Taking? Authorizing Provider  albuterol (PROVENTIL) (2.5 MG/3ML) 0.083% nebulizer solution USE 1 VIAL PER NEBULIZER EVERY 6 HRS AS NEEDED FOR WHEEZING 12/03/20   Ria Bush, MD  albuterol (VENTOLIN HFA) 108 (90 Base) MCG/ACT inhaler TAKE 2 PUFFS BY MOUTH EVERY 6 HOURS AS NEEDED FOR WHEEZE OR SHORTNESS OF BREATH 12/14/21   Ria Bush, MD  ARIPiprazole (ABILIFY) 5 MG tablet Take 1 tablet (5 mg total) by mouth daily. 10/04/21   Ria Bush, MD  B Complex-C (SUPER B COMPLEX PO) Take 1 tablet by mouth daily.    [provider]  Ca Phosphate-Cholecalciferol [phone] MG-UNIT TABS Take 1 tablet by mouth daily.  04/29/16   [provider]  cholecalciferol 2000 units TABS Take 2,000 Units by mouth daily. 04/29/16   Ria Bush, MD  desvenlafaxine (PRISTIQ) 25 MG 24 hr tablet TAKE 1 TABLET (25 MG TOTAL) BY MOUTH DAILY. 04/11/22   Ria Bush, MD  fentaNYL (DURAGESIC) 75 MCG/HR Place 1 patch onto the skin every 3 (three) days. Patient taking differently: Place 1 patch onto the skin every 3 (three) days. Patch applied  yesterday 03/15/22   Ria Bush, MD  ferrous sulfate 325 (65 FE) MG tablet Take 1 tablet (325 mg total) by mouth every other day. 04/29/19   Ria Bush, MD  fluticasone [facility]) 50 MCG/ACT nasal spray Place 2 sprays into both nostrils daily as needed for allergies.  02/09/12   Ria Bush, MD  folic acid (FOLVITE) 1 MG tablet TAKE 1 TABLET BY MOUTH EVERY DAY 11/07/21   Ria Bush, MD  furosemide (LASIX) 40 MG tablet Take 1 tablet (40 mg total) by mouth 2 (two) times daily. 01/04/22   Ria Bush, MD  gabapentin (NEURONTIN) 300 MG capsule Take 1 capsule (300 mg total) by mouth in the morning and at bedtime. 06/27/21   Ria Bush, MD  Lactulose 20 GM/30ML SOLN Take 30 mLs (20 g total) by mouth 3 (three) times daily. 04/17/22 07/16/22  Enzo Bi, MD  Misc Natural Products [facility] ADVANCED) CAPS Take 1 capsule by mouth 2 (two) times daily.    [provider]  nicotine (NICODERM CQ - DOSED IN MG/24 HOURS) 14 mg/24hr patch APPLY 1 PATCH ONTO THE SKIN EVERY DAY 02/09/22   Ria Bush, MD  nitroGLYCERIN (NITROSTAT) 0.4 MG SL tablet Place 1 tablet (0.4 mg total) under the tongue every 5 (five) minutes as needed for chest pain. 10/10/12   Lelon Perla, MD  nystatin cream (MYCOSTATIN) APPLY TO AFFECTED AREA TWICE A DAY 07/19/21   Billey Co, MD  omeprazole (PRILOSEC) 40 MG capsule TAKE 1 CAPSULE BY MOUTH EVERY DAY 12/14/21   Ria Bush, MD  ondansetron (ZOFRAN) 4 MG tablet TAKE 1 TABLET BY MOUTH EVERY 8 HOURS AS  NEEDED FOR NAUSEA AND VOMITING 01/09/22   [name], MD  oxyCODONE (OXY IR/ROXICODONE) 5 MG immediate release tablet Take 1 tablet (5 mg total) by mouth every 4 (four) hours as needed for severe pain. 04/12/22   Ria Bush, MD  Red Yeast Rice 600 MG CAPS Take 2 capsules by mouth daily.    [provider]  spironolactone (ALDACTONE) 100 MG tablet Take 1 tablet (100 mg total) by mouth 2 (two) times daily. 02/03/22   Ria Bush, MD  Tiotropium Bromide Monohydrate (SPIRIVA RESPIMAT) 2.5 MCG/ACT AERS Inhale 2 puffs into the lungs daily. 10/04/21   Ria Bush, MD  vitamin B-12 (CYANOCOBALAMIN) 500 MCG tablet Take 1 tablet (500 mcg total) by mouth every Monday, Wednesday, and Friday. 12/27/18   Ria Bush, MD     Objective    Physical Exam: Vitals:   05/03/22 2043  BP: 116/60  Pulse: 79  Resp: 18  Temp: 99.1 F (37.3 C)  SpO2: 98%    General: appears to be stated age; alert, confused Skin: warm, dry, no rash Head:  AT/[place] Mouth:  Oral mucosa membranes appear dry, normal dentition Neck: supple; trachea midline Heart:  RRR; did not appreciate any M/R/G Lungs: CTAB, did not appreciate any wheezes, rales, or rhonchi Abdomen: + BS; soft, ND, NT Vascular: 2+ pedal pulses b/l; 2+ radial pulses b/l Extremities:  2+ edema in the bilateral lower extremities, no muscle wasting Neuro: strength and sensation intact in upper and lower extremities b/l    Labs on Admission: I have personally reviewed following labs and imaging studies  CBC: Recent Labs  Lab 05/03/22 2123 05/03/22 2132  WBC 6.6  --   NEUTROABS 4.7  --   HGB 12.2* 12.9*  HCT 37.0* 38.0*  MCV 100.8*  --   PLT 48*  --    Basic Metabolic Panel: Recent Labs  Lab 05/03/22 2123 05/03/22 2132  NA 135 137  K 4.1 4.2  CL 98  --   CO2 25  --   GLUCOSE 107*  --   BUN 50*  --   CREATININE 2.37*  --   CALCIUM 8.6*  --    GFR: CrCl cannot be calculated (Unknown ideal weight.). Liver Function Tests: Recent Labs  Lab 05/03/22 2123  AST 39  ALT 23  ALKPHOS 133*  BILITOT 2.1*  PROT 8.3*  ALBUMIN 2.5*   Recent Labs  Lab 05/03/22 2123  LIPASE 41   Recent Labs  Lab 05/03/22 2120  AMMONIA 165*   Coagulation Profile: Recent Labs  Lab 05/03/22 2123  INR 1.4*   Cardiac Enzymes: No results for input(s): "CKTOTAL", "CKMB", "CKMBINDEX", "TROPONINI" in the last 168 hours. BNP (last 3 results) No results for  input(s): "PROBNP" in the last 8760 hours. HbA1C: No results for input(s): "HGBA1C" in the last 72 hours. CBG: No results for input(s): "GLUCAP" in the last 168 hours. Lipid Profile: No results for input(s): "CHOL", "HDL", "LDLCALC", "TRIG", "CHOLHDL", "LDLDIRECT" in the last 72 hours. Thyroid Function Tests: No results for input(s): "TSH", "T4TOTAL", "FREET4", "T3FREE", "THYROIDAB" in the last 72 hours. Anemia Panel: No results for input(s): "VITAMINB12", "FOLATE", "FERRITIN", "TIBC", "IRON", "RETICCTPCT" in the last 72 hours. Urine analysis:    Component Value Date/Time   COLORURINE YELLOW 05/04/2022 0027   APPEARANCEUR HAZY (A) 05/04/2022 0027   APPEARANCEUR Hazy (A) 12/18/2019 1145   LABSPEC 1.008 05/04/2022 0027   PHURINE 6.0 05/04/2022 0027   GLUCOSEU NEGATIVE 05/04/2022 0027   HGBUR NEGATIVE 05/04/2022 0027  BILIRUBINUR NEGATIVE 05/04/2022 0027   BILIRUBINUR 1+ 12/13/2021 1214   BILIRUBINUR Negative 12/18/2019 [place] 05/04/2022 0027   PROTEINUR NEGATIVE 05/04/2022 0027   UROBILINOGEN 4.0 (A) 12/13/2021 1214   UROBILINOGEN 1.0 03/01/2008 1803   NITRITE NEGATIVE 05/04/2022 0027   LEUKOCYTESUR NEGATIVE 05/04/2022 0027    Radiological Exams on Admission: CT ABDOMEN PELVIS WO CONTRAST  Result Date: 05/04/2022 CLINICAL DATA:  Confusion.  End-stage liver disease.  Abdominal pain EXAM: CT ABDOMEN AND PELVIS WITHOUT CONTRAST TECHNIQUE: Multidetector CT imaging of the abdomen and pelvis was performed following the standard protocol without IV contrast. RADIATION DOSE REDUCTION: This exam was performed according to the departmental dose-optimization program which includes automated exposure control, adjustment of the mA and/or kV according to patient size and/or use of iterative reconstruction technique. COMPARISON:  CT abdomen and pelvis 01/31/2022 FINDINGS: Lower chest: No acute abnormality. Hepatobiliary: Nodular liver contour compatible with cirrhosis.  Unremarkable gallbladder. Similar dilation of the common bile duct measuring 1.2 cm. Pancreas: No acute abnormality. Punctate calcifications in the pancreatic head. No pancreatic ductal dilatation. Spleen: Splenomegaly. Adrenals/Urinary Tract: Normal adrenal glands. No urinary calculi or hydronephrosis. Unremarkable bladder. Stomach/Bowel: Normal caliber large and small bowel. No bowel wall thickening. Normal appendix. Unremarkable stomach. Vascular/Lymphatic: Aortic atherosclerosis. Suprarenal abdominal aortic aneurysm measuring 3.7 cm. Aorto bi-iliac stent. The stents extend into the bilateral renal arteries. Stent patency can not be assessed on noncontrast exam. Upper abdominal varices. Similar prominent aortocaval and inguinal lymph nodes. Reproductive: Prostate is unremarkable. Other: No free intraperitoneal fluid or air. Musculoskeletal: Right THA. Compression fracture deformity of L4 is unchanged. IMPRESSION: Cirrhosis with sequela of portal hypertension. Chronic pancreatitis. Unchanged suprarenal abdominal aortic aneurysm measuring 3.7 cm Aortic Atherosclerosis (ICD10-I70.0). Electronically Signed   By: Placido Sou M.D.   On: 05/04/2022 00:17   CT HEAD WO CONTRAST (5MM)  Result Date: 05/04/2022 CLINICAL DATA:  Altered mental status. EXAM: CT HEAD WITHOUT CONTRAST TECHNIQUE: Contiguous axial images were obtained from the base of the skull through the vertex without intravenous contrast. RADIATION DOSE REDUCTION: This exam was performed according to the departmental dose-optimization program which includes automated exposure control, adjustment of the mA and/or kV according to patient size and/or use of iterative reconstruction technique. COMPARISON:  April 15, 2022 FINDINGS: Brain: There is mild cerebral atrophy with widening of the extra-axial spaces and ventricular dilatation. There are areas of decreased attenuation within the white matter tracts of the supratentorial brain, consistent with  microvascular disease changes. Vascular: No hyperdense vessel or unexpected calcification. Skull: Normal. Negative for fracture or focal lesion. Sinuses/Orbits: No acute finding. Other: Mild right frontal scalp soft tissue swelling is seen. IMPRESSION: 1. No acute intracranial abnormality. 2. Mild right frontal scalp soft tissue swelling. 3. Generalized cerebral atrophy with chronic white matter small vessel ischemic changes. Electronically Signed   By: Virgina Norfolk M.D.   On: 05/04/2022 00:08      Assessment/Plan   Principal Problem:   Acute metabolic encephalopathy Active Problems:   Cirrhosis of liver not due to alcohol (HCC)   Chronic combined systolic and diastolic heart failure (HCC)   COPD (chronic obstructive pulmonary disease) (HCC)   Thrombocytopenia (HCC)   Chronic pain syndrome   Anemia of chronic disease   AKI (acute kidney injury) ([place])   COVID-19 virus infection      #) Acute metabolic encephalopathy: 2 days of progressive confusion relative to baseline mental status, which is suspected to be multifactorial in etiology, with potential contributions from hepatic encephalopathy,  given the presenting ammonia level is the highest it has ever been, per chart review, with additional potential metabolic contributions from acute kidney injury as well as resulting increased risk for confusion from toxic encephalopathy as a consequence of home central acting medications that are renally metabolized/cleared, likely resulting in increasing circulating value of these medications further metabolites as a consequence of interval acute kidney injury.  Specifically, these medications include oxycodone with its nephrotoxic metabolites, gabapentin.  Additionally, urinary drug screen noted to be positive for THC.  Also, potential metabolic contribution from finding of COVID positive PCR, notable after the patient was recently diagnosed with COVID infection.  Otherwise, no overt evidence of  underlying infectious process, including urinalysis that was inconsistent with UTI.  Also check chest x-ray.  Abdomen not particularly tender to suggest any contribution from underlying SBP.  In the context of a documented history of COPD, also check VBG to evaluate for any contribution from hypercapnic encephalopathy.  Plan: Advance lactulose dosing to 30 g p.o. every 6 hours, titrated to produce 2-3 soft stools per day after production of initial loose stool, noting the patient had initial dose of lactulose 2 hours ago.  Further evaluation management of presenting acute kidney injury, as further detailed below.  Hold home central acting medications for now, including gabapentin and short acting oxycodone.  Further evaluation management of presenting COVID-19 infection, as further detailed below.  Check VBG, chest x-ray, CPK, TSH.  Fall precautions ordered.         #) Acute Kidney Injury:  as quantified above.  This appears to be prerenal in nature, with differential including dehydration versus hepatorenal applications in the context of cirrhosis.  However, clinically, differential appears to favor contribution from dehydration at this time, noting that in the same interval in which the patient's renal function is worsened, his total bilirubin is showed some level of improvement.  Additionally, in reviewing his CBC trend, there is interval increase in all 3 cell lines relative to most recent prior, suggesting an element of hemoconcentration due to dehydration.  From both a diagnostic/therapeutic standpoint we will initiate very gentle IV fluids tonight, with close monitoring for ensuing renal trend/response to this measure, as further detailed below.  We will hold next doses of home diuretic regimen in the process.  Additional potential contributions towards presenting AKI include pharmacologic possibilities, including buildup of nephrotoxic metabolites of oxycodone given interval decline in her renal  clearance, as well as similar contribution from gabapentin.  Of note, urinalysis showed no evidence of overt urinary casts.  No evidence of postrenal obstructive process on today CT abdomen/pelvis.   Plan: monitor strict I's & O's and daily weights. Attempt to avoid nephrotoxic agents.  Hold home gabapentin and oxycodone for now.  Refrain from NSAIDs. Repeat CMP in the morning. Check serum magnesium level.  Gentle IV fluids in the form of lactated Ringer's at 50 cc/h x 6 hours.  Add-on random urine sodium and random urine creatinine.  Add on CPK level.  Hold home diuretic medications for now.            #) COVID-19 Infection: in the setting of presenting 2 days of worsening confusion, with positive COVID-19 PCR result in the ED today.  This is notable in the context of patient's wife having recently been diagnosed with COVID infection on November 2021, with the patient having close interval contact with his wife. no evidence of acute hypoxia to warrant initiation of systemic corticosteroids. However, in the context of  increased risk for progression of severity of COVID-19 infection in the setting of patient's age and multiple comorbidities, including chronic liver disease, COPD, and given that patient's symptoms started less than 5 days ago, criteria appear to be met for initiation of anti-viral therapy.  In reviewing the contraindications for molnupiravir, there does not appear to be a contraindication for this medication in the setting of liver failure.  In the absence of leukocytosis or objective fever, presentation does not meet criteria for sepsis.   Plan: Airborne and contact precautions. Monitor continuous pulse ox and monitor on telemetry. prn supplemental O2 to maintain O2 sats greater than or equal to 94%. Proning protocol initiated. PRN albuterol nebulizer. PRN acetaminophen for fever. Start Molnupiravir, as above. Refraining from systemic corticosteroids for now, as above. Check and  trend CRP. Check serum mag and phos levels. Check CMP/CBC in the AM. Flutter valve and incentive spirometry. Check procalcitonin, which, if non-elevated in the context of the pro inflammatory state associated with COVID-19, would provide a high degree of negative predictive value that would further decrease the likelihood of any contribution from bacterial pneumonia. Check chest x-ray, VBG.           #) Cirrhosis: Documented history of such, on the basis of Nash. Complicated by portal hypertension, for which the patient is on spironolactone and Lasix in 5 2 ratio, as further quantified above.  Complicated by chronic thrombocytopenia as well as hepatic encephalopathy, as further detailed above .  Total bilirubin trending down relative to most recent prior.  INR 1.4. Presenting MELD score: 22, conferring an estimated 3 month mortality of  19.6%.    As described above, there is clinical suspicion for dehydration resulting in acute kidney injury as opposed to hepatorenal implications.  We will hold next dose of home diuretic medications, will resume.  Gentle IV fluids with close monitoring of interval response and renal function, as above.  Concern for an element of hepatic encephalopathy contributing to the patient's 2-day course of progressive confusion, with lactulose appearing to be at an all-time high value, which will prompt escalation of lactulose intervention, as further detailed below.    Plan: Monitor strict I's and O's and daily weights.  Repeat CMP, CBC, and INR in the AM. Check serum mag level.  Hold next dose of spironolactone/Lasix, as above.  Lactulose 30 g p.o. every 6 hours titrated reduced to 3 soft stools per day, first dose now.           #) COPD: Documented history thereof, without clinical evidence of acute exacerbation at this time.   Outpatient respiratory regimen includes the following: Spiriva.   Plan: cont outpatient Spiriva. Prn albuterol nebulizer. Check CMP and  serum magnesium level in the AM.  In the context of presenting acute encephalopathy, will also check VBG to evaluate for any contribution from hypercapnic encephalopathy.             #) Chronic pain syndrome: Documented history of such, with outpatient pain regimen that includes every 72 hour fentanyl patches as well as prn short acting oxycodone in addition to scheduled gabapentin.  Given interval worsening renal function, there is concern for buildup of nephrotoxic metabolites of oxycodone, with additional implications of diminished renal clearance of gabapentin, all in the context of presenting acute encephalopathy.  Consequently, we will hold home gabapentin and oxycodone for now, noting likely residual analgesic benefits of prior doses of these medications given diminished renal clearance thereof.  We will continue fentanyl patch, ensuring   that prior is removed before next dose is initiated this morning.  We will refrain from NSAIDs in the setting of cirrhosis.  Plan: Hold him gabapentin, oxycodone.  Continue home Duragesic fentanyl patches, as above.  Further evaluation management of presenting AKI, as above.             #) Anemia of chronic disease: Documented history of such, a/w with baseline hgb range 9-12, with presenting hgb consistent with this range, in the absence of any overt evidence of active bleed.  On scheduled oral iron supplementation at home.   Plan: Repeat CBC in the morning.  Continue outpatient oral iron supplementation.  INR in the morning.            #) Chronic pancytopenia: As a consequence of cirrhosis, with presenting platelet count trending up slightly relative to most recent prior.  No evidence of active bleed.  Not on a blood thinners as an outpatient.  Plan: Refraining from pharmacologic DVT prophylaxis.  Repeat CBC in the morning.           #) Chronic systolic/diastolic heart failure: Documented history of such, with most  recent prior echocardiogram occurring in May 2020, which is notable for LVEF 35 to 40% as well as finding of diastolic dysfunction, with normal right ventricular systolic function.  Clinically, presentation appears less suggestive of acute heart failure exacerbation.  Rather, there appears to be an element of intravascular depletion due to dehydration, as further detailed above.  Will check chest x-ray and BNP to further evaluate, and will hold next doses of diuretics will presumed.  Gentle IV fluids with close monitoring of ensuing renal function response, as above.  Plan: Monitor strict I's and O's and daily weights.  Hold next dose of home spironolactone/Lasix, as above.  Add on BNP.  Check chest x-ray.  Repeat CMP in the morning.      DVT prophylaxis: SCD's   Code Status: Full code (confirmed via [facility]  hospice representative, as further detailed above) Disposition Plan: Per Rounding Team Consults called: EDP discussed the patient's case with the on-call [facility] hospice representative, as further detailed above;  Admission status: Observation     I SPENT GREATER THAN 75  MINUTES IN CLINICAL CARE TIME/MEDICAL DECISION-MAKING IN COMPLETING THIS ADMISSION.      Tadd Holtmeyer B Telia Amundson DO [facility]  From 7PM - 7AM   05/04/2022, 1:34 AM     

## 2022-05-04 NOTE — ED Notes (Signed)
Patient observed to have had a bowel movement. Patient non-compliant with call bell and did not alert staff of need to use restroom. RN and NT cleaned patient, changed bed linens. Patient noted to have open wound to right upper thigh, RN applied cleaned wound and applied mepilex for protection. Patient repositioned for comfort and placed in clean gown with warm blankets.

## 2022-05-04 NOTE — ED Notes (Signed)
Pt continues to take his pulse ox and cardiac monitor off

## 2022-05-05 DIAGNOSIS — G9341 Metabolic encephalopathy: Secondary | ICD-10-CM | POA: Diagnosis not present

## 2022-05-05 DIAGNOSIS — U071 COVID-19: Secondary | ICD-10-CM | POA: Diagnosis not present

## 2022-05-05 DIAGNOSIS — I5042 Chronic combined systolic (congestive) and diastolic (congestive) heart failure: Secondary | ICD-10-CM | POA: Diagnosis not present

## 2022-05-05 DIAGNOSIS — K7460 Unspecified cirrhosis of liver: Secondary | ICD-10-CM | POA: Diagnosis not present

## 2022-05-05 LAB — CBC
HCT: 33.4 % — ABNORMAL LOW (ref 39.0–52.0)
Hemoglobin: 11.5 g/dL — ABNORMAL LOW (ref 13.0–17.0)
MCH: 34.1 pg — ABNORMAL HIGH (ref 26.0–34.0)
MCHC: 34.4 g/dL (ref 30.0–36.0)
MCV: 99.1 fL (ref 80.0–100.0)
Platelets: 37 10*3/uL — ABNORMAL LOW (ref 150–400)
RBC: 3.37 MIL/uL — ABNORMAL LOW (ref 4.22–5.81)
RDW: 19.3 % — ABNORMAL HIGH (ref 11.5–15.5)
WBC: 6 10*3/uL (ref 4.0–10.5)
nRBC: 0 % (ref 0.0–0.2)

## 2022-05-05 LAB — COMPREHENSIVE METABOLIC PANEL
ALT: 24 U/L (ref 0–44)
AST: 56 U/L — ABNORMAL HIGH (ref 15–41)
Albumin: 2.4 g/dL — ABNORMAL LOW (ref 3.5–5.0)
Alkaline Phosphatase: 115 U/L (ref 38–126)
Anion gap: 8 (ref 5–15)
BUN: 36 mg/dL — ABNORMAL HIGH (ref 8–23)
CO2: 25 mmol/L (ref 22–32)
Calcium: 8.4 mg/dL — ABNORMAL LOW (ref 8.9–10.3)
Chloride: 102 mmol/L (ref 98–111)
Creatinine, Ser: 1.35 mg/dL — ABNORMAL HIGH (ref 0.61–1.24)
GFR, Estimated: 58 mL/min — ABNORMAL LOW (ref >60)
Glucose, Bld: 102 mg/dL — ABNORMAL HIGH (ref 70–99)
Potassium: 3.9 mmol/L (ref 3.5–5.1)
Sodium: 135 mmol/L (ref 135–145)
Total Bilirubin: 3 mg/dL — ABNORMAL HIGH (ref 0.3–1.2)
Total Protein: 7.6 g/dL (ref 6.5–8.1)

## 2022-05-05 LAB — C-REACTIVE PROTEIN: CRP: 3.4 mg/dL — ABNORMAL HIGH (ref <1.0)

## 2022-05-05 LAB — AMMONIA: Ammonia: 77 umol/L — ABNORMAL HIGH (ref 9–35)

## 2022-05-05 MED ORDER — ORAL CARE MOUTH RINSE: 15.0000 mL | OROMUCOSAL | Status: DC | PRN

## 2022-05-05 NOTE — Progress Notes (Signed)
PROGRESS NOTE    [name]  BSJ:[id number] DOB: [birth year] DOA: 05/03/2022 PCP: Ria Bush, MD   Chief Complaint  Patient presents with   Altered Mental Status    Brief Narrative:     [name] is a 66 y.o. male with medical history significant for cirrhosis secondary to [facility], for which the patient is on hospice, complicated by portal hypertension, hepatic encephalopathy, COPD, chronic pain syndrome, anemia of chronic disease, chronic systolic/diastolic heart failure, chronic thrombocytopenia, who is admitted to [facility] on 05/03/2022 with acute encephalopathy after presenting from home to [facility] ED for evaluation of altered mental status. Patient's wife reportedly diagnosed with COVID-19 infection , The patient has been in close contact with her since that time.  Workup significant for hyperammonemia, COVID-19 positive. -patient is under [facility] care hospice care, he remains a full code.  Assessment & Plan:   Principal Problem:   Acute metabolic encephalopathy Active Problems:   Cirrhosis of liver not due to alcohol (HCC)   Chronic combined systolic and diastolic heart failure (HCC)   COPD (chronic obstructive pulmonary disease) (HCC)   Thrombocytopenia (HCC)   Chronic pain syndrome   Anemia of chronic disease   AKI (acute kidney injury) ([place])   COVID-19 virus infection   Acute metabolic encephalopathy  Acute hepatic encephalopathy Hyperammonemia - multifactorial, due to hepatic encephalopathy/hyperammonemia, COVID-19 infection as well. -Did decrease his fentanyl dosing from 75 mcg patch to 50 mcg patch -Patient much improved this morning, but he remains mildly confused -CT head on admission with no acute findings  AKI -Improving, fluid down to 135 today, will DC his IV fluids if oral intake is reliable .  COVID-19 infection -no hypoxia, no indication for steroids, but high risk, so we will continue with molnupiravir  Liver  cirrhosis Hyperammonemia Coagulopathy Chronic thrombocytopenia -With history of Nash, complicated by portal hypertension. -Elevated INR at 1.4 on admission -Ammonia level is trending down, continue with current dose lactulose -hold Aldactone/Lasix due to worsening renal function  COPD -No wheezing, no indication for steroids, continue with Spiriva, as needed albuterol  Chronic pain syndrome  -Decrease dose of fentanyl patch given altered mental status  chronic pancytopenia Anemia of chronic disease -Baseline, continue to monitor closely, so far no indication for transfusion -Continue with SCD for DVT prophylaxis, avoiding pharmacologic DVT prophylaxis   Chronic systolic/diastolic CHF - most recent prior echocardiogram occurring in May 2020, which is notable for LVEF 35 to 40% as well as finding of diastolic dysfunction, with normal right ventricular systolic function  -Appears to be on the dry side, continue to hold diuresis, I will DC his IV fluids today, likely can resume diuresis in couple days.  Obesity  Body mass index is 41.02 kg/m.     Patient with right eyelash wound, improving  on topical antibiotics.   DVT prophylaxis: SCD Code Status: (Full) Family Communication: None at bedside Disposition:   Status is: Need to be changed to inpatient given he remains altered, still with elevated ammonia level, and renal function not back to baseline, he remains on IV fluids Consultants:  None  Subjective:  No significant events overnight as discussed with staff, he denies any complaints today  Objective: Vitals:   05/04/22 2251 05/05/22 0034 05/05/22 0410 05/05/22 0829  BP: 98/60 118/61 (!) 112/94 (!) 114/57  Pulse: 94 (!) 102 99 94  Resp: 16 16 16 18   Temp: 98 F (36.7 C) 97.7 F (36.5 C) 98.7 F (37.1 C) 98.5 F (36.9  C)  TempSrc: Axillary Oral Oral Oral  SpO2: 100% 99% 98% 98%  Weight:  126 kg    Height:  5' 9"  (1.753 m)      Intake/Output Summary (Last  24 hours) at 05/05/2022 1029 Last data filed at 05/05/2022 0200 Gross per 24 hour  Intake 120 ml  Output 350 ml  Net -230 ml   Filed Weights   05/04/22 1013 05/05/22 0034  Weight: (!) 137 kg 126 kg    Examination:   Awake, he is more alert and communicative today, remains with some confusion Symmetrical Chest wall movement, Good air movement bilaterally, CTAB RRR,No Gallops,Rubs or new Murmurs, No Parasternal Heave +ve B.Sounds, Abd Soft, No tenderness, No rebound - guarding or rigidity. No Cyanosis, Clubbing or edema, No new Rash or bruise      Data Reviewed: I have personally reviewed following labs and imaging studies  CBC: Recent Labs  Lab 05/03/22 2123 05/03/22 2132 05/04/22 0532 05/05/22 0124  WBC 6.6  --  5.6 6.0  NEUTROABS 4.7  --  3.8  --   HGB 12.2* 12.9* 11.6* 11.5*  HCT 37.0* 38.0* 34.6* 33.4*  MCV 100.8*  --  100.3* 99.1  PLT 48*  --  46* 37*    Basic Metabolic Panel: Recent Labs  Lab 05/03/22 2123 05/03/22 2132 05/04/22 0800 05/05/22 0124  NA 135 137 138 135  K 4.1 4.2 4.1 3.9  CL 98  --  103 102  CO2 25  --  26 25  GLUCOSE 107*  --  89 102*  BUN 50*  --  47* 36*  CREATININE 2.37*  --  1.79* 1.35*  CALCIUM 8.6*  --  8.6* 8.4*  MG  --   --  2.2  --     GFR: Estimated Creatinine Clearance: 69.7 mL/min (A) (by C-G formula based on SCr of 1.35 mg/dL (H)).  Liver Function Tests: Recent Labs  Lab 05/03/22 2123 05/04/22 0800 05/05/22 0124  AST 39 41 56*  ALT 23 24 24   ALKPHOS 133* 120 115  BILITOT 2.1* 2.2* 3.0*  PROT 8.3* 7.7 7.6  ALBUMIN 2.5* 2.4* 2.4*    CBG: No results for input(s): "GLUCAP" in the last 168 hours.   Recent Results (from the past 240 hour(s))  Resp Panel by RT-PCR (Flu A&B, Covid) Anterior Nasal Swab     Status: Abnormal   Collection Time: 05/04/22  2:01 AM   Specimen: Anterior Nasal Swab  Result Value Ref Range Status   SARS Coronavirus 2 by RT PCR POSITIVE (A) NEGATIVE Final    Comment: (NOTE) SARS-CoV-2  target nucleic acids are DETECTED.  The SARS-CoV-2 RNA is generally detectable in upper respiratory specimens during the acute phase of infection. Positive results are indicative of the presence of the identified virus, but do not rule out bacterial infection or co-infection with other pathogens not detected by the test. Clinical correlation with patient history and other diagnostic information is necessary to determine patient infection status. The expected result is Negative.  Fact Sheet for Patients: EntrepreneurPulse.com.au  Fact Sheet for Healthcare Providers: IncredibleEmployment.be  This test is not yet approved or cleared by the [place] FDA and  has been authorized for detection and/or diagnosis of SARS-CoV-2 by FDA under an Emergency Use Authorization (EUA).  This EUA will remain in effect (meaning this test can be used) for the duration of  the COVID-19 declaration under Section 564(b)(1) of the A ct, 21 U.S.C. section 360bbb-3(b)(1), unless the authorization is terminated  or revoked sooner.     Influenza A by PCR NEGATIVE NEGATIVE Final   Influenza B by PCR NEGATIVE NEGATIVE Final    Comment: (NOTE) The Xpert Xpress SARS-CoV-2/FLU/RSV plus assay is intended as an aid in the diagnosis of influenza from Nasopharyngeal swab specimens and should not be used as a sole basis for treatment. Nasal washings and aspirates are unacceptable for Xpert Xpress SARS-CoV-2/FLU/RSV testing.  Fact Sheet for Patients: EntrepreneurPulse.com.au  Fact Sheet for Healthcare Providers: IncredibleEmployment.be  This test is not yet approved or cleared by the [place] FDA and has been authorized for detection and/or diagnosis of SARS-CoV-2 by FDA under an Emergency Use Authorization (EUA). This EUA will remain in effect (meaning this test can be used) for the duration of the COVID-19 declaration under Section  564(b)(1) of the Act, 21 U.S.C. section 360bbb-3(b)(1), unless the authorization is terminated or revoked.  Performed at [facility], [place] [address] [place], [place] [address]          Radiology Studies: CT ABDOMEN PELVIS WO CONTRAST  Result Date: 05/04/2022 CLINICAL DATA:  Confusion.  End-stage liver disease.  Abdominal pain EXAM: CT ABDOMEN AND PELVIS WITHOUT CONTRAST TECHNIQUE: Multidetector CT imaging of the abdomen and pelvis was performed following the standard protocol without IV contrast. RADIATION DOSE REDUCTION: This exam was performed according to the departmental dose-optimization program which includes automated exposure control, adjustment of the mA and/or kV according to patient size and/or use of iterative reconstruction technique. COMPARISON:  CT abdomen and pelvis 01/31/2022 FINDINGS: Lower chest: No acute abnormality. Hepatobiliary: Nodular liver contour compatible with cirrhosis. Unremarkable gallbladder. Similar dilation of the common bile duct measuring 1.2 cm. Pancreas: No acute abnormality. Punctate calcifications in the pancreatic head. No pancreatic ductal dilatation. Spleen: Splenomegaly. Adrenals/Urinary Tract: Normal adrenal glands. No urinary calculi or hydronephrosis. Unremarkable bladder. Stomach/Bowel: Normal caliber large and small bowel. No bowel wall thickening. Normal appendix. Unremarkable stomach. Vascular/Lymphatic: Aortic atherosclerosis. Suprarenal abdominal aortic aneurysm measuring 3.7 cm. Aorto bi-iliac stent. The stents extend into the bilateral renal arteries. Stent patency can not be assessed on noncontrast exam. Upper abdominal varices. Similar prominent aortocaval and inguinal lymph nodes. Reproductive: Prostate is unremarkable. Other: No free intraperitoneal fluid or air. Musculoskeletal: Right THA. Compression fracture deformity of L4 is unchanged. IMPRESSION: Cirrhosis with sequela of portal hypertension. Chronic pancreatitis. Unchanged  suprarenal abdominal aortic aneurysm measuring 3.7 cm Aortic Atherosclerosis (ICD10-I70.0). Electronically Signed   By: Placido Sou M.D.   On: 05/04/2022 00:17   CT HEAD WO CONTRAST (5MM)  Result Date: 05/04/2022 CLINICAL DATA:  Altered mental status. EXAM: CT HEAD WITHOUT CONTRAST TECHNIQUE: Contiguous axial images were obtained from the base of the skull through the vertex without intravenous contrast. RADIATION DOSE REDUCTION: This exam was performed according to the departmental dose-optimization program which includes automated exposure control, adjustment of the mA and/or kV according to patient size and/or use of iterative reconstruction technique. COMPARISON:  April 15, 2022 FINDINGS: Brain: There is mild cerebral atrophy with widening of the extra-axial spaces and ventricular dilatation. There are areas of decreased attenuation within the white matter tracts of the supratentorial brain, consistent with microvascular disease changes. Vascular: No hyperdense vessel or unexpected calcification. Skull: Normal. Negative for fracture or focal lesion. Sinuses/Orbits: No acute finding. Other: Mild right frontal scalp soft tissue swelling is seen. IMPRESSION: 1. No acute intracranial abnormality. 2. Mild right frontal scalp soft tissue swelling. 3. Generalized cerebral atrophy with chronic white matter small vessel ischemic changes. Electronically Signed  By: Virgina Norfolk M.D.   On: 05/04/2022 00:08        Scheduled Meds:  bacitracin-polymyxin b  1 Application Right Eye TID   fentaNYL  1 patch Transdermal Q72H   ferrous sulfate  325 mg Oral QODAY   lactulose  30 g Oral Once   lactulose  30 g Oral Q6H   molnupiravir EUA  4 capsule Oral BID   pantoprazole  40 mg Oral Daily   umeclidinium bromide  1 puff Inhalation Daily   Continuous Infusions:  lactated ringers 75 mL/hr at 05/05/22 0417     LOS: 1 day       Phillips Climes, MD [facility]   To contact the  attending provider between 7A-7P or the covering provider during after hours 7P-7A, please log into the web site [email or web address] and access using universal [place] password for that web site. If you do not have the password, please call the hospital operator.  05/05/2022, 10:29 AM

## 2022-05-05 NOTE — Progress Notes (Signed)
[facility] ED 09 [facility]) Hospital Liaison Note    Mr. Westhoff is a current hospice patient with a terminal diagnosis of end stage NASH cirrhosis of liver. Patient was brought to [facility] ED by his wife due to confusion and altered mental status. Patient was evaluated in the [facility] ED and was admitted for acute metabolic encephalopathy and Covid 19 infection. Per Dr. Karie Georges, [facility] MD, this is a related hospital admission.    Visited Mr. Rosenow at bedside. Patient was alert and conversant this morning, but still had some confusion. IV fluids running. Patient has been moved to 3E16. Patient is currently on Isolation for COVID 31. Spoke with [place] home team SW, who stated that wife, Hosie Poisson would be turning in the Medicaid application on Monday. Glade Lloyd via telephone to provide an update. Left a voicemail.   Patient is inpatient appropriate due to treatment for metabolic encephalopathy and IV fluids.    V/S: 114/57 bp, 98.5 Temp, 94 bpm, 18 RR, 98% on RA  I/O: 120/1250  Labs:  Glucose: 102 (H) BUN: 36 (H) Creatinine: 1.35 (H) Calcium: 8.4 (L) Albumin: 2.4 (L) AST: 56 (H) Ammonia: 77 (H) Total Bilirubin: 3.0 (H) GFR, Estimated: 58 (L) CRP: 3.4 (H) RBC: 3.37 (L) Hemoglobin: 11.5 (L) HCT: 33.4 (L) MCH: 34.1 (H) RDW: 19.3 (H) Platelets: 37 (L)  Diagnostics:  None today   IV/PRN:  lactated ringers infusion Rate: 75 mL/hr Freq: Continuous Route: IV x1  Problem list: Assessment & Plan:   Principal Problem:   Acute metabolic encephalopathy Active Problems:   Cirrhosis of liver not due to alcohol (HCC)   Chronic combined systolic and diastolic heart failure (HCC)   COPD (chronic obstructive pulmonary disease) (HCC)   Thrombocytopenia (HCC)   Chronic pain syndrome   Anemia of chronic disease   AKI (acute kidney injury) ([place])   COVID-19 virus infection     Acute metabolic encephalopathy  Acute hepatic encephalopathy Hyperammonemia - multifactorial, due to  hepatic encephalopathy/hyperammonemia, COVID-19 infection as well. -Did decrease his fentanyl dosing from 75 mcg patch to 50 mcg patch -Patient much improved this morning, but he remains mildly confused -CT head on admission with no acute findings   AKI -Improving, fluid down to 135 today, will DC his IV fluids if oral intake is reliable .   COVID-19 infection -no hypoxia, no indication for steroids, but high risk, so we will continue with molnupiravir   Liver cirrhosis Hyperammonemia Coagulopathy Chronic thrombocytopenia -With history of Nash, complicated by portal hypertension. -Elevated INR at 1.4 on admission -Ammonia level is trending down, continue with current dose lactulose -hold Aldactone/Lasix due to worsening renal function   COPD -No wheezing, no indication for steroids, continue with Spiriva, as needed albuterol   Chronic pain syndrome  -Decrease dose of fentanyl patch given altered mental status   chronic pancytopenia Anemia of chronic disease -Baseline, continue to monitor closely, so far no indication for transfusion -Continue with SCD for DVT prophylaxis, avoiding pharmacologic DVT prophylaxis     Chronic systolic/diastolic CHF - most recent prior echocardiogram occurring in May 2020, which is notable for LVEF 35 to 40% as well as finding of diastolic dysfunction, with normal right ventricular systolic function  -Appears to be on the dry side, continue to hold diuresis, I will DC his IV fluids today, likely can resume diuresis in couple days.   Obesity  Body mass index is 41.02 kg/m.    GOC: Patient is currently a Full Code.    D/C  planning: Ongoing.     Family: Called wife and left a message.   IDT: Updated  Transfer summary and Medication list placed in patient's chart.    Zigmund Gottron, RN [facility] Liaison  [phone]

## 2022-05-05 NOTE — Consult Note (Signed)
WOC Nurse Consult Note: Reason for Consult: Stage 3 PI; open wound inner upper thigh Wound type: unclear, not documented on nursing flow sheet as pressure injury Pressure Injury POA: NA Measurement:no measurements in nursing flowsheet Patient will not allow [place] nurse to assess, reports he does not have any wounds.  Looks like from skin assessment has abrasions.  I will follow up with nursing to discuss options or needs.    Re consult if needed, will not follow at this time. Thanks  Sameka Bagent [employer], RN,CWOCN, CNS, [place] [phone])

## 2022-05-05 NOTE — Evaluation (Signed)
Physical Therapy Evaluation Patient Details Name: [name] MRN: [id number] DOB: 03-Jun-1956 Today's Date: 05/05/2022  History of Present Illness  Pt is a 66 y.o. male who presented 05/03/22 with AMS, admitted with acute metabolic and hepatic encephalopathy, hyperammonemia, AKI, and COVID-19 infection. Of note, pt recently admitted 04/15/22-04/17/22 due to fall and was treated for hepatic encephalopathy. PMH: cirrhosis 2/2 NASH on Hospice, portal hypertension, hepatic encephalopathy, COPD, chronic pain syndrome, anemia, chronic systolic/diastolic heart failure, chronic thrombocytopenia, AAA, CAD, [place] tooth muscular atrophy, gout, hepatitis, hx of MI, HLD, HTN, DM2   Clinical Impression  Pt presents with condition above and deficits mentioned below, see PT Problem List. Pt is a poor historian currently, contradicting himself during the session and in comparison to info obtained during his recent admission in regards to home set-up and PLOF. Pt is reporting he has not been ambulating much lately due to recent fall, but uses a rollator when he does ambulate. Per chart and pt, he lives with his wife, who works. Pt verbalized that he believes his wife is leaving/divorcing him and he is unsure of his dispo options at this time. Will need to confirm level of assistance available to him at d/c. Currently, pt is requiring up to [facility] for bed mobility and minA for transfers and to take a few steps with a RW. Pt deferred further gait attempts due to fatigue. He is at risk for falls with noted deficits in strength, activity tolerance, balance, and cognition. He would be unsafe to be home alone at this time. If pt has the level of assistance needed at home, then recommend HHPT follow-up. If not, then may need to pursue therapy at a SNF. Will continue to follow acutely.     Recommendations for follow up therapy are one component of a multi-disciplinary discharge planning process, led by the attending  physician.  Recommendations may be updated based on patient status, additional functional criteria and insurance authorization.  Follow Up Recommendations Home health PT (pending home situation/available assist, otherwise may need to consider SNF)      Assistance Recommended at Discharge Frequent or constant Supervision/Assistance  Patient can return home with the following  A little help with walking and/or transfers;A little help with bathing/dressing/bathroom;Assistance with cooking/housework;Direct supervision/assist for medications management;Direct supervision/assist for financial management;Assist for transportation;Help with stairs or ramp for entrance    Equipment Recommendations Rollator (4 wheels);BSC/3in1 (if he does not already have them)  Recommendations for Other Services       Functional Status Assessment Patient has had a recent decline in their functional status and demonstrates the ability to make significant improvements in function in a reasonable and predictable amount of time.     Precautions / Restrictions Precautions Precautions: Fall Restrictions Weight Bearing Restrictions: No      Mobility  Bed Mobility Overal bed mobility: Needs Assistance Bed Mobility: Supine to Sit, Sit to Supine     Supine to sit: Mod assist, HOB elevated Sit to supine: Mod assist, HOB elevated   General bed mobility comments: ModA to ascend trunk and provide light assistance to bring legs off EOB, cuing to use bed rail to pull up to sit. ModA to swing legs up onto bed, cuing pt to lean laterally onto elbow to control trunk descent to supine.    Transfers Overall transfer level: Needs assistance Equipment used: Rolling walker (2 wheels) Transfers: Sit to/from Stand, Bed to chair/wheelchair/BSC Sit to Stand: Min assist   Step pivot transfers: Min assist  General transfer comment: MinA to power up to stand from EOB and from commode, cuing to push up from sitting  surface but pt instead pulling up on RW. MinA for stability with stand step pivot bed <> commode with RW.    Ambulation/Gait Ambulation/Gait assistance: Min assist Gait Distance (Feet): 2 Feet (x2 bouts of ~2 ft each bout) Assistive device: Rolling walker (2 wheels) Gait Pattern/deviations: Decreased step length - right, Decreased step length - left, Decreased stride length, Step-through pattern, Shuffle Gait velocity: reduced Gait velocity interpretation: <1.31 ft/sec, indicative of household ambulator   General Gait Details: Pt with slow, small, shuffling steps between bed and commode, deferring to ambulate further due to fatigue. MinA for stability.  Stairs            Wheelchair Mobility    Modified Rankin (Stroke Patients Only) Modified Rankin (Stroke Patients Only) Pre-Morbid Rankin Score: Moderate disability Modified Rankin: Moderately severe disability     Balance Overall balance assessment: Needs assistance Sitting-balance support: No upper extremity supported, Feet supported Sitting balance-Leahy Scale: Fair Sitting balance - Comments: Static sitting EOB with supervision for safety   Standing balance support: Bilateral upper extremity supported, During functional activity, Reliant on assistive device for balance Standing balance-Leahy Scale: Poor Standing balance comment: Reliant on RW and up to minA                             Pertinent Vitals/Pain Pain Assessment Pain Assessment: Faces Faces Pain Scale: No hurt Pain Intervention(s): Monitored during session    Home Living Family/patient expects to be discharged to:: Private residence Living Arrangements: Spouse/significant other Available Help at Discharge: Family;Available PRN/intermittently (wife works during the day) Type of Home: Mobile home Home Access: Level entry       Home Layout: One level Home Equipment: Economy (4 wheels);Electric scooter;Hospital bed (initially said "I don't  think so" in regards to having any DME) Additional Comments: Unsure of the home situation on whether pt will be allowed to return home with his wife; info pt provided today contradicted with info obtained 04/16/22    Prior Function Prior Level of Function : Patient poor historian/Family not available             Mobility Comments: Pt reports he has not been walking but a few feet with his rollator as of lately due to recent falls. As of 04/16/22 pt reports he's ambulatory with rollator in the home, denies falls, walks household distances. ADLs Comments: As of 04/16/22 pt reports independent with ADLs. Wife assists with medication management, transportation, and picks up groceries. Pt reports being able to assist with cooking/cleaning.     Hand Dominance        Extremity/Trunk Assessment   Upper Extremity Assessment Upper Extremity Assessment: Defer to OT evaluation    Lower Extremity Assessment Lower Extremity Assessment: Generalized weakness    Cervical / Trunk Assessment Cervical / Trunk Assessment: Other exceptions Cervical / Trunk Exceptions: increased body habitus  Communication   Communication: No difficulties  Cognition Arousal/Alertness: Awake/alert Behavior During Therapy: WFL for tasks assessed/performed Overall Cognitive Status: No family/caregiver present to determine baseline cognitive functioning                                 General Comments: Pt's PLOF and home info this admission contradicts with info obtained in his recent prior admission. Pt also changing  his answers from not thinking he had any DME to having a variety of DME. Pt repeating some statements and has poor safety awareness.        General Comments General comments (skin integrity, edema, etc.): VSS on RA    Exercises     Assessment/Plan    PT Assessment Patient needs continued PT services  PT Problem List Decreased strength;Decreased activity tolerance;Decreased  mobility;Decreased balance;Decreased cognition;Decreased safety awareness;Obesity       PT Treatment Interventions DME instruction;Gait training;Stair training;Functional mobility training;Therapeutic activities;Therapeutic exercise;Balance training;Cognitive remediation;Neuromuscular re-education;Patient/family education    PT Goals (Current goals can be found in the Care Plan section)  Acute Rehab PT Goals Patient Stated Goal: to feel better PT Goal Formulation: With patient Time For Goal Achievement: 05/19/22 Potential to Achieve Goals: Good    Frequency Min 3X/week     Co-evaluation               AM-PAC PT "6 Clicks" Mobility  Outcome Measure Help needed turning from your back to your side while in a flat bed without using bedrails?: A Little Help needed moving from lying on your back to sitting on the side of a flat bed without using bedrails?: A Lot Help needed moving to and from a bed to a chair (including a wheelchair)?: A Little Help needed standing up from a chair using your arms (e.g., wheelchair or bedside chair)?: A Little Help needed to walk in hospital room?: Total Help needed climbing 3-5 steps with a railing? : Total 6 Click Score: 13    End of Session Equipment Utilized During Treatment: Gait belt Activity Tolerance: Patient tolerated treatment well;Patient limited by fatigue Patient left: in bed;with bed alarm set;with call bell/phone within reach Nurse Communication: Mobility status;Other (comment) (vitals) PT Visit Diagnosis: Muscle weakness (generalized) (M62.81);Unsteadiness on feet (R26.81);Other abnormalities of gait and mobility (R26.89);History of falling (Z91.81);Difficulty in walking, not elsewhere classified (R26.2)    Time: 9233-0076 PT Time Calculation (min) (ACUTE ONLY): 32 min   Charges:   PT Evaluation $PT Eval Moderate Complexity: 1 Mod PT Treatments $Therapeutic Activity: 8-22 mins        Moishe Spice, PT, DPT Acute  Rehabilitation Services  Office: [phone]   Orvan Falconer 05/05/2022, 5:13 PM

## 2022-05-06 DIAGNOSIS — G9341 Metabolic encephalopathy: Secondary | ICD-10-CM | POA: Diagnosis not present

## 2022-05-06 DIAGNOSIS — K7460 Unspecified cirrhosis of liver: Secondary | ICD-10-CM | POA: Diagnosis not present

## 2022-05-06 LAB — BASIC METABOLIC PANEL
Anion gap: 6 (ref 5–15)
BUN: 20 mg/dL (ref 8–23)
CO2: 25 mmol/L (ref 22–32)
Calcium: 8.3 mg/dL — ABNORMAL LOW (ref 8.9–10.3)
Chloride: 102 mmol/L (ref 98–111)
Creatinine, Ser: 1.05 mg/dL (ref 0.61–1.24)
GFR, Estimated: >60 mL/min (ref >60)
Glucose, Bld: 118 mg/dL — ABNORMAL HIGH (ref 70–99)
Potassium: 4.2 mmol/L (ref 3.5–5.1)
Sodium: 133 mmol/L — ABNORMAL LOW (ref 135–145)

## 2022-05-06 LAB — CBC
HCT: 31.6 % — ABNORMAL LOW (ref 39.0–52.0)
Hemoglobin: 10.4 g/dL — ABNORMAL LOW (ref 13.0–17.0)
MCH: 33.2 pg (ref 26.0–34.0)
MCHC: 32.9 g/dL (ref 30.0–36.0)
MCV: 101 fL — ABNORMAL HIGH (ref 80.0–100.0)
Platelets: 30 10*3/uL — ABNORMAL LOW (ref 150–400)
RBC: 3.13 MIL/uL — ABNORMAL LOW (ref 4.22–5.81)
RDW: 18.6 % — ABNORMAL HIGH (ref 11.5–15.5)
WBC: 4.8 10*3/uL (ref 4.0–10.5)
nRBC: 0 % (ref 0.0–0.2)

## 2022-05-06 LAB — AMMONIA: Ammonia: 89 umol/L — ABNORMAL HIGH (ref 9–35)

## 2022-05-06 MED ORDER — FENTANYL 25 MCG/HR TD PT72
1.0000 | MEDICATED_PATCH | TRANSDERMAL | Status: DC
  Administered 2022-05-06: 1 via TRANSDERMAL
  Filled 2022-05-06: qty 1

## 2022-05-06 NOTE — Progress Notes (Signed)
PROGRESS NOTE    [name]  ZOX:[id number] DOB: [birth year] DOA: 05/03/2022 PCP: Ria Bush, MD   Chief Complaint  Patient presents with   Altered Mental Status    Brief Narrative:     [name] is a 66 y.o. male with medical history significant for cirrhosis secondary to [facility], for which the patient is on hospice, complicated by portal hypertension, hepatic encephalopathy, COPD, chronic pain syndrome, anemia of chronic disease, chronic systolic/diastolic heart failure, chronic thrombocytopenia, who is admitted to [facility] on 05/03/2022 with acute encephalopathy after presenting from home to [facility] ED for evaluation of altered mental status. Patient's wife reportedly diagnosed with COVID-19 infection , The patient has been in close contact with her since that time.  Workup significant for hyperammonemia, COVID-19 positive. -patient is under [facility] care hospice care, he remains a full code.  Assessment & Plan:   Principal Problem:   Acute metabolic encephalopathy Active Problems:   Cirrhosis of liver not due to alcohol (HCC)   Chronic combined systolic and diastolic heart failure (HCC)   COPD (chronic obstructive pulmonary disease) (HCC)   Thrombocytopenia (HCC)   Chronic pain syndrome   Anemia of chronic disease   AKI (acute kidney injury) ([place])   COVID-19 virus infection   Acute metabolic encephalopathy  Acute hepatic encephalopathy Hyperammonemia - multifactorial, due to hepatic encephalopathy/hyperammonemia, COVID-19 infection as well. -Did decrease his fentanyl dosing from 75 mcg patch to 50 mcg patch -Patient much improved this morning, but he remains mildly confused -CT head on admission with no acute findings -Will lower her fentanyl patch further today to 25 mcg, will continue to hold his gabapentin as well.  AKI -resolved, renal function back to baseline -Appears to be a euvolemic today, likely will resume on home dose Aldactone  tomorrow, as will likely resume home on a Lasix but a lower dose.  COVID-19 infection -no hypoxia, no indication for steroids, but high risk, so we will continue with molnupiravir  Liver cirrhosis Hyperammonemia Coagulopathy Chronic thrombocytopenia -With history of [place], complicated by portal hypertension. -Elevated INR at 1.4 on admission -Ammonia level is trending down, continue with current dose lactulose Please see above discussion regarding Aldactone and Lasix.  COPD -No wheezing, no indication for steroids, continue with Spiriva, as needed albuterol  Chronic pain syndrome  -Decrease dose of fentanyl patch given altered mental status, will go ahead and decrease further to 25 mcg today.  chronic pancytopenia Anemia of chronic disease -Baseline, continue to monitor closely, so far no indication for transfusion -Continue with SCD for DVT prophylaxis, avoiding pharmacologic DVT prophylaxis   Chronic systolic/diastolic CHF - most recent prior echocardiogram occurring in May 2020, which is notable for LVEF 35 to 40% as well as finding of diastolic dysfunction, with normal right ventricular systolic function  -Appears to be on the dry side on admission, see with IV fluids, currently appears to be euvolemic, Zoom tomorrow on her lower dose Lasix. .  Obesity  Body mass index is 41.44 kg/m.     Patient with right eyelash wound, improving  on topical antibiotics.   DVT prophylaxis: SCD Code Status: (Full) Family Communication: None at bedside, left wife a Advertising account executive. Disposition:   Status is: Need to be changed to inpatient given he remains altered, still with elevated ammonia level, and renal function not back to baseline, he remains on IV fluids Consultants:  None  Subjective:  Patient/staff reports a few loose bowel movements yesterday secondary to lactulose.  Objective: Vitals:  05/05/22 1900 05/05/22 1955 05/06/22 0646 05/06/22 0752  BP:   (!) 151/86 (!) 158/77   Pulse: (!) 102  96 90  Resp:  18 18 16   Temp:  98.3 F (36.8 C) 98.3 F (36.8 C) 98.1 F (36.7 C)  TempSrc:  Oral Oral Oral  SpO2:   97%   Weight:   127.3 kg   Height:        Intake/Output Summary (Last 24 hours) at 05/06/2022 1308 Last data filed at 05/06/2022 1200 Gross per 24 hour  Intake --  Output 1400 ml  Net -1400 ml   Filed Weights   05/04/22 1013 05/05/22 0034 05/06/22 0646  Weight: (!) 137 kg 126 kg 127.3 kg    Examination:   Patient is awake today, oriented x 2, he is more appropriate and coherent, but he remains mildly confused. Right eyelid scratch without significant change Symmetrical Chest wall movement, Good air movement bilaterally, CTAB RRR,No Gallops,Rubs or new Murmurs, No Parasternal Heave +ve B.Sounds, Abd Soft, No tenderness, No rebound - guarding or rigidity. No Cyanosis, Clubbing or edema, No new Rash or bruise       Data Reviewed: I have personally reviewed following labs and imaging studies  CBC: Recent Labs  Lab 05/03/22 2123 05/03/22 2132 05/04/22 0532 05/05/22 0124 05/06/22 0032  WBC 6.6  --  5.6 6.0 4.8  NEUTROABS 4.7  --  3.8  --   --   HGB 12.2* 12.9* 11.6* 11.5* 10.4*  HCT 37.0* 38.0* 34.6* 33.4* 31.6*  MCV 100.8*  --  100.3* 99.1 101.0*  PLT 48*  --  46* 37* 30*    Basic Metabolic Panel: Recent Labs  Lab 05/03/22 2123 05/03/22 2132 05/04/22 0800 05/05/22 0124 05/06/22 0032  NA 135 137 138 135 133*  K 4.1 4.2 4.1 3.9 4.2  CL 98  --  103 102 102  CO2 25  --  26 25 25   GLUCOSE 107*  --  89 102* 118*  BUN 50*  --  47* 36* 20  CREATININE 2.37*  --  1.79* 1.35* 1.05  CALCIUM 8.6*  --  8.6* 8.4* 8.3*  MG  --   --  2.2  --   --     GFR: Estimated Creatinine Clearance: 90.1 mL/min (by C-G formula based on SCr of 1.05 mg/dL).  Liver Function Tests: Recent Labs  Lab 05/03/22 2123 05/04/22 0800 05/05/22 0124  AST 39 41 56*  ALT 23 24 24   ALKPHOS 133* 120 115  BILITOT 2.1* 2.2* 3.0*  PROT 8.3* 7.7 7.6   ALBUMIN 2.5* 2.4* 2.4*    CBG: No results for input(s): "GLUCAP" in the last 168 hours.   Recent Results (from the past 240 hour(s))  Resp Panel by RT-PCR (Flu A&B, Covid) Anterior Nasal Swab     Status: Abnormal   Collection Time: 05/04/22  2:01 AM   Specimen: Anterior Nasal Swab  Result Value Ref Range Status   SARS Coronavirus 2 by RT PCR POSITIVE (A) NEGATIVE Final    Comment: (NOTE) SARS-CoV-2 target nucleic acids are DETECTED.  The SARS-CoV-2 RNA is generally detectable in upper respiratory specimens during the acute phase of infection. Positive results are indicative of the presence of the identified virus, but do not rule out bacterial infection or co-infection with other pathogens not detected by the test. Clinical correlation with patient history and other diagnostic information is necessary to determine patient infection status. The expected result is Negative.  Fact Sheet for Patients: EntrepreneurPulse.com.au  Fact Sheet for Healthcare Providers: IncredibleEmployment.be  This test is not yet approved or cleared by the [place] FDA and  has been authorized for detection and/or diagnosis of SARS-CoV-2 by FDA under an Emergency Use Authorization (EUA).  This EUA will remain in effect (meaning this test can be used) for the duration of  the COVID-19 declaration under Section 564(b)(1) of the A ct, 21 U.S.C. section 360bbb-3(b)(1), unless the authorization is terminated or revoked sooner.     Influenza A by PCR NEGATIVE NEGATIVE Final   Influenza B by PCR NEGATIVE NEGATIVE Final    Comment: (NOTE) The Xpert Xpress SARS-CoV-2/FLU/RSV plus assay is intended as an aid in the diagnosis of influenza from Nasopharyngeal swab specimens and should not be used as a sole basis for treatment. Nasal washings and aspirates are unacceptable for Xpert Xpress SARS-CoV-2/FLU/RSV testing.  Fact Sheet for  Patients: EntrepreneurPulse.com.au  Fact Sheet for Healthcare Providers: IncredibleEmployment.be  This test is not yet approved or cleared by the [place] FDA and has been authorized for detection and/or diagnosis of SARS-CoV-2 by FDA under an Emergency Use Authorization (EUA). This EUA will remain in effect (meaning this test can be used) for the duration of the COVID-19 declaration under Section 564(b)(1) of the Act, 21 U.S.C. section 360bbb-3(b)(1), unless the authorization is terminated or revoked.  Performed at [facility], [place] [address] [place], [place] [address]          Radiology Studies: No results found.      Scheduled Meds:  bacitracin-polymyxin b  1 Application Right Eye TID   fentaNYL  1 patch Transdermal Q72H   ferrous sulfate  325 mg Oral QODAY   lactulose  30 g Oral Once   lactulose  30 g Oral Q6H   molnupiravir EUA  4 capsule Oral BID   pantoprazole  40 mg Oral Daily   umeclidinium bromide  1 puff Inhalation Daily   Continuous Infusions:     LOS: 2 days       Phillips Climes, MD [facility]   To contact the attending provider between 7A-7P or the covering provider during after hours 7P-7A, please log into the web site [email or web address] and access using universal [place] password for that web site. If you do not have the password, please call the hospital operator.  05/06/2022, 1:08 PM

## 2022-05-06 NOTE — Evaluation (Addendum)
Occupational Therapy Evaluation Patient Details Name: [name] MRN: [id number] DOB: Oct 15, 1955 Today's Date: 05/06/2022   History of Present Illness Pt is a 66 y.o. male who presented 05/03/22 with AMS, admitted with acute metabolic and hepatic encephalopathy, hyperammonemia, AKI, and COVID-19 infection. Of note, pt recently admitted 04/15/22-04/17/22 due to fall and was treated for hepatic encephalopathy. PMH: cirrhosis 2/2 NASH on Hospice, portal hypertension, hepatic encephalopathy, COPD, chronic pain syndrome, anemia, chronic systolic/diastolic heart failure, chronic thrombocytopenia, AAA, CAD, charcot marie tooth muscular atrophy, gout, hepatitis, hx of MI, HLD, HTN, DM2   Clinical Impression   Pt reports needing assist at baseline for ADLs and uses rollator for functional mobility. Pt currently needing min-max A for ADLs, min-mod A for bed mobility and min A for transfers with RW. Pt with decreased cognition, needing increased time to state month and DOB, increased cues throughout session to follow commands. Pt presenting with impairments listed below, will follow acutely. Recommend HHOT at d/c if family able to provide 24/7 level of assist needed, if unable will need SNF.      Recommendations for follow up therapy are one component of a multi-disciplinary discharge planning process, led by the attending physician.  Recommendations may be updated based on patient status, additional functional criteria and insurance authorization.   Follow Up Recommendations  Home health OT (if family is able to arrange 24/7 assist at home, if unable will need SNF)     Assistance Recommended at Discharge Frequent or constant Supervision/Assistance  Patient can return home with the following A little help with walking and/or transfers;A lot of help with bathing/dressing/bathroom;Assistance with cooking/housework;Direct supervision/assist for medications management;Direct supervision/assist for  financial management;Assist for transportation;Help with stairs or ramp for entrance    Functional Status Assessment  Patient has had a recent decline in their functional status and demonstrates the ability to make significant improvements in function in a reasonable and predictable amount of time.  Equipment Recommendations  BSC/3in1    Recommendations for Other Services PT consult     Precautions / Restrictions Precautions Precautions: Fall Restrictions Weight Bearing Restrictions: No Other Position/Activity Restrictions:       Mobility Bed Mobility Overal bed mobility: Needs Assistance Bed Mobility: Supine to Sit, Sit to Supine     Supine to sit: Mod assist Sit to supine: Min assist   General bed mobility comments: mod A for trunk elevation    Transfers Overall transfer level: Needs assistance Equipment used: Rolling walker (2 wheels) Transfers: Sit to/from Stand, Bed to chair/wheelchair/BSC Sit to Stand: Min assist           General transfer comment: walking short distnace in room to sink, cues for direction/redirection      Balance Overall balance assessment: Needs assistance Sitting-balance support: No upper extremity supported, Feet supported Sitting balance-Leahy Scale: Fair Sitting balance - Comments: Static sitting EOB with supervision for safety   Standing balance support: Bilateral upper extremity supported, During functional activity, Reliant on assistive device for balance Standing balance-Leahy Scale: Poor Standing balance comment: Reliant on RW and up to minA                           ADL either performed or assessed with clinical judgement   ADL Overall ADL's : Needs assistance/impaired Eating/Feeding: Set up;Sitting   Grooming: Minimal assistance;Standing;Sitting   Upper Body Bathing: Moderate assistance   Lower Body Bathing: Maximal assistance   Upper Body Dressing : Moderate assistance  Lower Body Dressing: Maximal  assistance   Toilet Transfer: Minimal assistance;Rolling walker (2 wheels);Ambulation;Regular Toilet   Toileting- Water quality scientist and Hygiene: Total assistance Toileting - Clothing Manipulation Details (indicate cue type and reason): primofit     Functional mobility during ADLs: Minimal assistance;Rolling walker (2 wheels)       Vision Ability to See in Adequate Light: 1 Impaired Patient Visual Report: Blurring of vision Additional Comments: pt report blurred vision since fall, able to see TV and read numbers/letters on wall in front of bed, noted with bruising near R eye, pt reports from prior fall     Perception Perception Perception Tested?: No   Praxis Praxis Praxis tested?: Not tested    Pertinent Vitals/Pain Pain Assessment Pain Assessment: Faces Pain Score: 4  Faces Pain Scale: Hurts little more Pain Location: L eye Pain Descriptors / Indicators: Discomfort Pain Intervention(s): Limited activity within patient's tolerance, Monitored during session, Repositioned     Hand Dominance     Extremity/Trunk Assessment Upper Extremity Assessment Upper Extremity Assessment: Generalized weakness   Lower Extremity Assessment Lower Extremity Assessment: Generalized weakness   Cervical / Trunk Assessment Cervical / Trunk Assessment: Normal   Communication Communication Communication: No difficulties   Cognition Arousal/Alertness: Awake/alert Behavior During Therapy: WFL for tasks assessed/performed Overall Cognitive Status: No family/caregiver present to determine baseline cognitive functioning                                 General Comments: A & O x 4, increased time needing reorientation, tangenial with conversation, occasionally inapproporiate     General Comments  VSS On RA    Exercises     Shoulder Instructions      Home Living Family/patient expects to be discharged to:: Private residence Living Arrangements: Spouse/significant  other Available Help at Discharge: Family;Available PRN/intermittently (wife works during day) Type of Home: Mobile home Home Access: Ramped entrance     [place]: One level     Bathroom Shower/Tub: Occupational psychologist: Standard Bathroom Accessibility: No   Home Equipment: Ensign (4 wheels);Hospital bed;Electric scooter          Prior Functioning/Environment Prior Level of Function : Patient poor historian/Family not available             Mobility Comments: Pt reports he has not been walking but a few feet with his rollator as of lately due to recent falls. As of 04/16/22 pt reports he's ambulatory with rollator in the home, denies falls, walks household distances. ADLs Comments: pt reports sponge bathing a sink when wife is no present to assist, wife assisst wih all ADLs, wife does IADLs        OT Problem List: Decreased strength;Decreased activity tolerance;Impaired balance (sitting and/or standing);Obesity;Decreased cognition;Decreased safety awareness      OT Treatment/Interventions: Self-care/ADL training;Therapeutic exercise;Energy conservation;DME and/or AE instruction;Therapeutic activities;Patient/family education;Balance training    OT Goals(Current goals can be found in the care plan section) Acute Rehab OT Goals Patient Stated Goal: none stated OT Goal Formulation: With patient Time For Goal Achievement: 05/20/22 Potential to Achieve Goals: Good ADL Goals Pt Will Perform Upper Body Dressing: with min guard assist;sitting;standing Pt Will Perform Lower Body Dressing: sitting/lateral leans;sit to/from stand;with min assist;with adaptive equipment Pt Will Transfer to Toilet: with min assist;ambulating;regular height toilet Pt Will Perform Tub/Shower Transfer: Shower transfer;Tub transfer;with min assist;ambulating;rolling walker;3 in 1  OT Frequency: Min 2X/week    Co-evaluation  AM-PAC OT "6 Clicks" Daily Activity      Outcome Measure Help from another person eating meals?: A Little Help from another person taking care of personal grooming?: A Little Help from another person toileting, which includes using toliet, bedpan, or urinal?: A Lot Help from another person bathing (including washing, rinsing, drying)?: A Lot Help from another person to put on and taking off regular upper body clothing?: A Lot Help from another person to put on and taking off regular lower body clothing?: A Lot 6 Click Score: 14   End of Session Equipment Utilized During Treatment: Gait belt;Rolling walker (2 wheels) Nurse Communication: Mobility status  Activity Tolerance: Patient tolerated treatment well Patient left: in bed;with bed alarm set;with call bell/phone within reach  OT Visit Diagnosis: Muscle weakness (generalized) (M62.81)                Time: 1344-1410 OT Time Calculation (min): 26 min Charges:  OT General Charges $OT Visit: 1 Visit OT Evaluation $OT Eval Moderate Complexity: 1 Mod OT Treatments $Therapeutic Activity: 8-22 mins  Renaye Rakers, OTD, OTR/L SecureChat Preferred Acute Rehab [phone]  Jackye Dever K Koonce 05/06/2022, 2:22 PM

## 2022-05-06 NOTE — Progress Notes (Signed)
[facility]) Hospital Liaison Note    [name] is a current hospice patient with a terminal diagnosis of end stage NASH cirrhosis of liver. Patient was brought to [name] ED by his wife due to confusion and altered mental status. Patient was evaluated in the [facility] ED and was admitted for acute metabolic encephalopathy and Covid 19 infection. Per Dr. Karie Georges, [facility] MD, this is a related hospital admission.    Patient was alert and conversant this morning. Patient is currently on Isolation for COVID 19. Glade Lloyd via telephone to provide an update. Left a voicemail. Seems to be back at baseline. IV fluids have been stopped and are now on all po medications. Anticipated to discharge home tomorrow.   Patient is inpatient appropriate due to receiving treatment for hyperammonemia.  V/S: 158/77 bp, 98.1 Temp, 90 bpm, 16 RR, 97% on RA  I/O: 480/800  Labs:  Sodium: 133 (L) Glucose: 118 (H) Calcium: 8.3 (L) Ammonia: 89 (H) RBC: 3.13 (L) Hemoglobin: 10.4 (L) HCT: 31.6 (L) MCV: 101.0 (H) RDW: 18.6 (H) Platelets: 30 (L)  Diagnostics:  None today   IV/PRN:  None today.  Problem list: Assessment & Plan:   Principal Problem:   Acute metabolic encephalopathy Active Problems:   Cirrhosis of liver not due to alcohol (HCC)   Chronic combined systolic and diastolic heart failure (HCC)   COPD (chronic obstructive pulmonary disease) (HCC)   Thrombocytopenia (HCC)   Chronic pain syndrome   Anemia of chronic disease   AKI (acute kidney injury) ([place])   COVID-19 virus infection     Acute metabolic encephalopathy  Acute hepatic encephalopathy Hyperammonemia - multifactorial, due to hepatic encephalopathy/hyperammonemia, COVID-19 infection as well. -Did decrease his fentanyl dosing from 75 mcg patch to 50 mcg patch -Patient much improved this morning, but he remains mildly confused -CT head on admission with no acute findings -Will lower her fentanyl patch  further today to 25 mcg, will continue to hold his gabapentin as well.   AKI -resolved, renal function back to baseline -Appears to be a euvolemic today, likely will resume on home dose Aldactone tomorrow, as will likely resume home on a Lasix but a lower dose.   COVID-19 infection -no hypoxia, no indication for steroids, but high risk, so we will continue with molnupiravir   Liver cirrhosis Hyperammonemia Coagulopathy Chronic thrombocytopenia -With history of Nash, complicated by portal hypertension. -Elevated INR at 1.4 on admission -Ammonia level is trending down, continue with current dose lactulose Please see above discussion regarding Aldactone and Lasix.   COPD -No wheezing, no indication for steroids, continue with Spiriva, as needed albuterol   Chronic pain syndrome  -Decrease dose of fentanyl patch given altered mental status, will go ahead and decrease further to 25 mcg today.   chronic pancytopenia Anemia of chronic disease -Baseline, continue to monitor closely, so far no indication for transfusion -Continue with SCD for DVT prophylaxis, avoiding pharmacologic DVT prophylaxis     Chronic systolic/diastolic CHF - most recent prior echocardiogram occurring in May 2020, which is notable for LVEF 35 to 40% as well as finding of diastolic dysfunction, with normal right ventricular systolic function  -Appears to be on the dry side on admission, see with IV fluids, currently appears to be euvolemic, Zoom tomorrow on her lower dose Lasix. .   Obesity  Body mass index is 41.44 kg/m.  GOC: Patient is currently a Full Code.    D/C planning: Ongoing, anticipated for tomorrow.  Family: Called wife and left a message.   IDT: Updated   Zigmund Gottron, RN [facility] Liaison  [phone]

## 2022-05-07 DIAGNOSIS — N179 Acute kidney failure, unspecified: Secondary | ICD-10-CM | POA: Diagnosis not present

## 2022-05-07 DIAGNOSIS — G9341 Metabolic encephalopathy: Secondary | ICD-10-CM | POA: Diagnosis not present

## 2022-05-07 DIAGNOSIS — K7460 Unspecified cirrhosis of liver: Secondary | ICD-10-CM | POA: Diagnosis not present

## 2022-05-07 LAB — AMMONIA: Ammonia: 126 umol/L — ABNORMAL HIGH (ref 9–35)

## 2022-05-07 MED ORDER — FUROSEMIDE 40 MG PO TABS
40.0000 mg | ORAL_TABLET | Freq: Every day | ORAL | Status: DC
  Administered 2022-05-07 – 2022-05-09 (×3): 40 mg via ORAL
  Filled 2022-05-07 (×3): qty 1

## 2022-05-07 MED ORDER — SPIRONOLACTONE 25 MG PO TABS
25.0000 mg | ORAL_TABLET | Freq: Every day | ORAL | Status: DC
  Administered 2022-05-07 – 2022-05-09 (×3): 25 mg via ORAL
  Filled 2022-05-07 (×3): qty 1
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
## 2022-05-07 NOTE — Progress Notes (Signed)
[facility]) Hospital Liaison Note    [name] is a current hospice patient with a terminal diagnosis of end stage NASH cirrhosis of liver. Patient was brought to [facility] by his wife due to confusion and altered mental status. Patient was evaluated in the [facility] ED and was admitted for acute metabolic encephalopathy and Covid 19 infection. Per Dr. Karie Georges, [facility] MD, this is a related hospital admission.    Patient was alert and conversant this morning, ready to go home. Hospital staff tried repeatedly to call wife, [name], to provide an update about patient being ready for discharge. I called a few times and left a voicemail. Once able to talk to [name], she stated that she did not want the patient to come back home. I explained that the patient was back at baseline and has changed to a routine level of care, which means that he could incurred hospital room and board charges.  She stated that she understood but still did not want him to come back home.  Will be following up tomorrow for discharge disposition. Hospital team aware.   V/S: 132/64 bp, 98.6 Temp, 93 bpm, 15 RR, 95% on RA  I/O: 240/1300  Labs:  None today  Diagnostics:  None today   IV/PRN:  None today.  Problem list: Assessment & Plan:   Principal Problem:   Acute metabolic encephalopathy Active Problems:   Cirrhosis of liver not due to alcohol (HCC)   Chronic combined systolic and diastolic heart failure (HCC)   COPD (chronic obstructive pulmonary disease) (HCC)   Thrombocytopenia (HCC)   Chronic pain syndrome   Anemia of chronic disease   AKI (acute kidney injury) ([place])   COVID-19 virus infection     Acute metabolic encephalopathy  Acute hepatic encephalopathy Hyperammonemia - multifactorial, due to hepatic encephalopathy/hyperammonemia, COVID-19 infection as well. -CT head on admission with no acute findings - Mentation much improved, appears to be at baseline after decreasing  his fentanyl from 75 to 25 mcg, and with his ammonia level improving     AKI -resolved, renal function back to baseline -Resume back on Aldactone, Lasix today   Hyponatremia -Mild, at 133, should improve now he is back on his Aldactone and Lasix.   COVID-19 infection -no hypoxia, no indication for steroids, but high risk, so we will continue with molnupiravir   Liver cirrhosis Hyperammonemia Coagulopathy Chronic thrombocytopenia -With history of Nash, complicated by portal hypertension. -Elevated INR at 1.4 on admission -Ammonia level is trending down, continue with current dose lactulose Please see above discussion regarding Aldactone and Lasix.   COPD -No wheezing, no indication for steroids, continue with Spiriva, as needed albuterol   Chronic pain syndrome  -Decrease dose of fentanyl patch given altered mental status, will go ahead and decrease further to 25 mcg today.   chronic pancytopenia Anemia of chronic disease -Baseline, continue to monitor closely, so far no indication for transfusion -Continue with SCD for DVT prophylaxis, avoiding pharmacologic DVT prophylaxis     Chronic systolic/diastolic CHF - most recent prior echocardiogram occurring in May 2020, which is notable for LVEF 35 to 40% as well as finding of diastolic dysfunction, with normal right ventricular systolic function  -Appears to be on the dry side on admission, appropriately hydrated with IV fluid, now to resume back on his Lasix and Aldactone.     Obesity  Body mass index is 41.05 kg/m.   GOC: Patient is currently a Full Code.    D/C  planning: Ongoing, was anticipated for today but Wife did not want him to come home. Will be followed up tomorrow.   Family: Called and spoke to wife via telephone. She stated that she does not want him to come back to the house.    IDT: Updated   Miguel Gottron, RN [facility] Liaison  [phone]

## 2022-05-07 NOTE — Progress Notes (Signed)
PROGRESS NOTE    [name]  MPN:[id number] DOB: [birth year] DOA: 05/03/2022 PCP: Ria Bush, MD   Chief Complaint  Patient presents with   Altered Mental Status    Brief Narrative:     [name] is a 66 y.o. male with medical history significant for cirrhosis secondary to [facility], for which the patient is on hospice, complicated by portal hypertension, hepatic encephalopathy, COPD, chronic pain syndrome, anemia of chronic disease, chronic systolic/diastolic heart failure, chronic thrombocytopenia, who is admitted to [facility] on 05/03/2022 with acute encephalopathy after presenting from home to [facility] ED for evaluation of altered mental status. Patient's wife reportedly diagnosed with COVID-19 infection , The patient has been in close contact with her since that time.  Workup significant for hyperammonemia, COVID-19 positive. -patient is under [facility] care hospice care, he remains a full code.  Assessment & Plan:   Principal Problem:   Acute metabolic encephalopathy Active Problems:   Cirrhosis of liver not due to alcohol (HCC)   Chronic combined systolic and diastolic heart failure (HCC)   COPD (chronic obstructive pulmonary disease) (HCC)   Thrombocytopenia (HCC)   Chronic pain syndrome   Anemia of chronic disease   AKI (acute kidney injury) ([place])   COVID-19 virus infection   Acute metabolic encephalopathy  Acute hepatic encephalopathy Hyperammonemia - multifactorial, due to hepatic encephalopathy/hyperammonemia, COVID-19 infection as well. -CT head on admission with no acute findings - Mentation much improved, appears to be at baseline after decreasing his fentanyl from 75 to 25 mcg, and with his ammonia level improving   AKI -resolved, renal function back to baseline -Resume back on Aldactone, Lasix today  Hyponatremia -Mild, at 133, should improve now he is back on his Aldactone and Lasix.  COVID-19 infection -no hypoxia, no  indication for steroids, but high risk, so we will continue with molnupiravir  Liver cirrhosis Hyperammonemia Coagulopathy Chronic thrombocytopenia -With history of [place], complicated by portal hypertension. -Elevated INR at 1.4 on admission -Ammonia level is trending down, continue with current dose lactulose Please see above discussion regarding Aldactone and Lasix.  COPD -No wheezing, no indication for steroids, continue with Spiriva, as needed albuterol  Chronic pain syndrome  -Decrease dose of fentanyl patch given altered mental status, will go ahead and decrease further to 25 mcg today.  chronic pancytopenia Anemia of chronic disease -Baseline, continue to monitor closely, so far no indication for transfusion -Continue with SCD for DVT prophylaxis, avoiding pharmacologic DVT prophylaxis   Chronic systolic/diastolic CHF - most recent prior echocardiogram occurring in May 2020, which is notable for LVEF 35 to 40% as well as finding of diastolic dysfunction, with normal right ventricular systolic function  -Appears to be on the dry side on admission, appropriately hydrated with IV fluid, now to resume back on his Lasix and Aldactone.    Obesity  Body mass index is 41.05 kg/m.     Patient with right eyelash wound, improving  on topical antibiotics.   DVT prophylaxis: SCD Code Status: (Full) Family Communication: None at bedside, unable to reach wife by phone Disposition: Plan to discharge home, with home hospice, social worker/hospice service involved about final disposition plan   Consultants:  None  Subjective:  No significant events overnight, he denies any complaints today.  Objective: Vitals:   05/06/22 1741 05/07/22 0338 05/07/22 0919 05/07/22 1105  BP:  (!) 152/88  132/64  Pulse:  88 82 93  Resp:   16 15  Temp: 98.9 F (37.2 C) 98  F (36.7 C)  98.6 F (37 C)  TempSrc: Oral Oral  Oral  SpO2:  93% 96% 95%  Weight:  126.1 kg    Height:         Intake/Output Summary (Last 24 hours) at 05/07/2022 1315 Last data filed at 05/07/2022 0347 Gross per 24 hour  Intake --  Output 400 ml  Net -400 ml   Filed Weights   05/05/22 0034 05/06/22 0646 05/07/22 0338  Weight: 126 kg 127.3 kg 126.1 kg    Examination:  Patient is awake, alert, appropriate, a frail, deconditioned Symmetrical Chest wall movement, Good air movement bilaterally, CTAB RRR,No Gallops,Rubs or new Murmurs, No Parasternal Heave +ve B.Sounds, Abd Soft, No tenderness, No rebound - guarding or rigidity. No Cyanosis, Clubbing or edema, No new Rash or bruise      Data Reviewed: I have personally reviewed following labs and imaging studies  CBC: Recent Labs  Lab 05/03/22 2123 05/03/22 2132 05/04/22 0532 05/05/22 0124 05/06/22 0032  WBC 6.6  --  5.6 6.0 4.8  NEUTROABS 4.7  --  3.8  --   --   HGB 12.2* 12.9* 11.6* 11.5* 10.4*  HCT 37.0* 38.0* 34.6* 33.4* 31.6*  MCV 100.8*  --  100.3* 99.1 101.0*  PLT 48*  --  46* 37* 30*    Basic Metabolic Panel: Recent Labs  Lab 05/03/22 2123 05/03/22 2132 05/04/22 0800 05/05/22 0124 05/06/22 0032  NA 135 137 138 135 133*  K 4.1 4.2 4.1 3.9 4.2  CL 98  --  103 102 102  CO2 25  --  26 25 25   GLUCOSE 107*  --  89 102* 118*  BUN 50*  --  47* 36* 20  CREATININE 2.37*  --  1.79* 1.35* 1.05  CALCIUM 8.6*  --  8.6* 8.4* 8.3*  MG  --   --  2.2  --   --     GFR: Estimated Creatinine Clearance: 89.7 mL/min (by C-G formula based on SCr of 1.05 mg/dL).  Liver Function Tests: Recent Labs  Lab 05/03/22 2123 05/04/22 0800 05/05/22 0124  AST 39 41 56*  ALT 23 24 24   ALKPHOS 133* 120 115  BILITOT 2.1* 2.2* 3.0*  PROT 8.3* 7.7 7.6  ALBUMIN 2.5* 2.4* 2.4*    CBG: No results for input(s): "GLUCAP" in the last 168 hours.   Recent Results (from the past 240 hour(s))  Resp Panel by RT-PCR (Flu A&B, Covid) Anterior Nasal Swab     Status: Abnormal   Collection Time: 05/04/22  2:01 AM   Specimen: Anterior Nasal  Swab  Result Value Ref Range Status   SARS Coronavirus 2 by RT PCR POSITIVE (A) NEGATIVE Final    Comment: (NOTE) SARS-CoV-2 target nucleic acids are DETECTED.  The SARS-CoV-2 RNA is generally detectable in upper respiratory specimens during the acute phase of infection. Positive results are indicative of the presence of the identified virus, but do not rule out bacterial infection or co-infection with other pathogens not detected by the test. Clinical correlation with patient history and other diagnostic information is necessary to determine patient infection status. The expected result is Negative.  Fact Sheet for Patients: EntrepreneurPulse.com.au  Fact Sheet for Healthcare Providers: IncredibleEmployment.be  This test is not yet approved or cleared by the [place] FDA and  has been authorized for detection and/or diagnosis of SARS-CoV-2 by FDA under an Emergency Use Authorization (EUA).  This EUA will remain in effect (meaning this test can be used) for the  duration of  the COVID-19 declaration under Section 564(b)(1) of the A ct, 21 U.S.C. section 360bbb-3(b)(1), unless the authorization is terminated or revoked sooner.     Influenza A by PCR NEGATIVE NEGATIVE Final   Influenza B by PCR NEGATIVE NEGATIVE Final    Comment: (NOTE) The Xpert Xpress SARS-CoV-2/FLU/RSV plus assay is intended as an aid in the diagnosis of influenza from Nasopharyngeal swab specimens and should not be used as a sole basis for treatment. Nasal washings and aspirates are unacceptable for Xpert Xpress SARS-CoV-2/FLU/RSV testing.  Fact Sheet for Patients: EntrepreneurPulse.com.au  Fact Sheet for Healthcare Providers: IncredibleEmployment.be  This test is not yet approved or cleared by the [place] FDA and has been authorized for detection and/or diagnosis of SARS-CoV-2 by FDA under an Emergency Use Authorization  (EUA). This EUA will remain in effect (meaning this test can be used) for the duration of the COVID-19 declaration under Section 564(b)(1) of the Act, 21 U.S.C. section 360bbb-3(b)(1), unless the authorization is terminated or revoked.  Performed at [facility], [place] [address], [place], [place] [address]          Radiology Studies: No results found.      Scheduled Meds:  fentaNYL  1 patch Transdermal Q72H   ferrous sulfate  325 mg Oral QODAY   lactulose  30 g Oral Once   lactulose  30 g Oral Q6H   molnupiravir EUA  4 capsule Oral BID   pantoprazole  40 mg Oral Daily   umeclidinium bromide  1 puff Inhalation Daily   Continuous Infusions:     LOS: 3 days       Phillips Climes, MD [facility]   To contact the attending provider between 7A-7P or the covering provider during after hours 7P-7A, please log into the web site [email or web address] and access using universal [place] password for that web site. If you do not have the password, please call the hospital operator.  05/07/2022, 1:15 PM

## 2022-05-08 ENCOUNTER — Ambulatory Visit: Payer: Medicare HMO | Admitting: Family Medicine

## 2022-05-08 ENCOUNTER — Telehealth: Payer: Self-pay | Admitting: Family Medicine

## 2022-05-08 DIAGNOSIS — G9341 Metabolic encephalopathy: Secondary | ICD-10-CM | POA: Diagnosis not present

## 2022-05-08 DIAGNOSIS — E7220 Disorder of urea cycle metabolism, unspecified: Secondary | ICD-10-CM | POA: Diagnosis not present

## 2022-05-08 DIAGNOSIS — N179 Acute kidney failure, unspecified: Secondary | ICD-10-CM | POA: Diagnosis not present

## 2022-05-08 DIAGNOSIS — K7460 Unspecified cirrhosis of liver: Secondary | ICD-10-CM | POA: Diagnosis not present

## 2022-05-08 LAB — AMMONIA: Ammonia: 178 umol/L — ABNORMAL HIGH (ref 9–35)

## 2022-05-08 MED ORDER — LACTULOSE 10 GM/15ML PO SOLN
30.0000 g | ORAL | Status: DC
  Administered 2022-05-08 – 2022-05-09 (×5): 30 g via ORAL
  Filled 2022-05-08 (×5): qty 45

## 2022-05-08 NOTE — Progress Notes (Signed)
RN observed bright red blood in patient bedside commode after bowel movement. MD notified

## 2022-05-08 NOTE — NC FL2 (Signed)
[place] LEVEL OF CARE FORM     IDENTIFICATION  Patient Name: [name] Birthdate: [birth year] Sex: male Admission Date (Current Location): 05/03/2022  [facility] and [place] Number:  Herbalist and Address:  [facility], [place] [address], [place], [place] [address]      Provider Number: [id number]  Attending Physician Name and Address:  Elgergawy, Silver Huguenin, MD  Relative Name and Phone Number:  Hagerty,[name] (Spouse)  [phone] (Mobile)    Current Level of Care: Hospital Recommended Level of Care: [place] Prior Approval Number:    Date Approved/Denied:   PASRR Number: [id number]  Discharge Plan: SNF    Current Diagnoses: Patient Active Problem List   Diagnosis Date Noted   Acute metabolic encephalopathy 62/22/9798   AKI (acute kidney injury) ([name]) 05/04/2022   COVID-19 virus infection 05/04/2022   Hepatic encephalopathy ([place]) 04/15/2022   Hospice care patient 02/01/2022   Cellulitis of perineum    Scrotal swelling    Scrotal pain    Edema    Chronic combined systolic and diastolic heart failure ([place]) 01/31/2022   Cellulitis, scrotum 01/31/2022   Venous stasis ulcer of left lower leg with edema of left lower leg ([place]) 01/06/2022   Lymphadenopathy of head and neck region 12/15/2021   FTT (failure to thrive) in adult 11/16/2021   Driving safety issue 10/06/2021   Decreased visual acuity 10/06/2021   Cognitive safety issue 06/27/2021   Status post right hip replacement 12/04/2020   Carotid stenosis, asymptomatic, bilateral 08/28/2020   Statin intolerance 08/05/2020   Mass of both parotid glands 08/05/2020   Memory deficit 01/10/2020   Hearing loss 08/07/2018   Abdominal aortic aneurysm (AAA) without rupture ([place]) 01/02/2018   History of MRSA infection 01/02/2018   History of myocardial infarction 01/02/2018   Aortic atherosclerosis ([place]) 01/01/2018   MDD (major depressive disorder),  recurrent severe, without psychosis ([place]) 11/17/2017   Candidal intertrigo 05/08/2017   Falls frequently 03/13/2017   Abnormal drug screen 07/09/2016   Thrombocytopenia ([place]) 04/29/2016   Anemia of chronic disease 04/29/2016   Protein-calorie malnutrition ([place]) 04/29/2016   RUQ abdominal pain 04/26/2016   Encounter for chronic pain management 01/04/2016   Preop cardiovascular exam 09/01/2014   Chronic fatigue 07/22/2014   Esophageal dysphagia 07/22/2014   Gross hematuria 05/19/2014   Encounter for general adult medical examination with abnormal findings 04/21/2014   Advanced care planning/counseling discussion 04/21/2014   Orthostatic hypotension 03/13/2014   Cirrhosis of liver not due to alcohol ([place]) 01/03/2014   Osteoarthritis of right hip 08/16/2012   Medicare annual wellness visit, subsequent 06/07/2012   Chronic dyspnea 10/06/2011   DDD (degenerative disc disease), cervical    Vitamin D deficiency 03/14/2011   Vitamin B12 deficiency 08/05/2010   Prediabetes 06/27/2010   OSA (obstructive sleep apnea) 05/16/2010   Cervical spondylosis 05/05/2010   Obesity, morbid, BMI 40.0-49.9 ([place]) 05/03/2010   Hidradenitis 05/03/2010   Smoker 10/21/2009   HLD (hyperlipidemia) 09/07/2009   Gout 09/07/2009   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   Essential hypertension 09/07/2009   CAD (coronary artery disease) 09/07/2009   CARDIOMYOPATHY 09/07/2009   COPD (chronic obstructive pulmonary disease) ([place]) 09/07/2009   Chronic pain syndrome 09/07/2009    Orientation RESPIRATION BLADDER Height & Weight     Self, Time, Place    Incontinent, External catheter Weight: 279 lb 5.2 oz (126.7 kg) Height:  5' 9"  (175.3 cm)  BEHAVIORAL SYMPTOMS/MOOD NEUROLOGICAL BOWEL NUTRITION STATUS  Incontinent Diet  AMBULATORY STATUS COMMUNICATION OF NEEDS Skin   Extensive Assist Verbally                         Personal Care Assistance Level of Assistance  Bathing, Feeding, Dressing Bathing  Assistance: Maximum assistance Feeding assistance: Independent Dressing Assistance: Limited assistance     Functional Limitations Info  Sight, Hearing, Speech Sight Info: Adequate Hearing Info: Adequate Speech Info: Adequate    SPECIAL CARE FACTORS FREQUENCY  PT (By licensed PT), OT (By licensed OT)     PT Frequency: 5x/week OT Frequency: 5x/week            Contractures Contractures Info: Not present    Additional Factors Info  Code Status, Allergies Code Status Info: Full code Allergies Info: Losartan, statins, wellbutrin, Allopurinol, baclofen, penicillins, Tramadol           Current Medications (05/08/2022):  This is the current hospital active medication list Current Facility-Administered Medications  Medication Dose Route Frequency Provider Last Rate Last Admin   acetaminophen (TYLENOL) tablet 650 mg  650 mg Oral Q6H PRN Howerter, Justin B, DO   650 mg at 05/05/22 2305   Or   acetaminophen (TYLENOL) suppository 650 mg  650 mg Rectal Q6H PRN Howerter, Justin B, DO       albuterol (PROVENTIL) (2.5 MG/3ML) 0.083% nebulizer solution 2.5 mg  2.5 mg Nebulization Q4H PRN Howerter, Justin B, DO       fentaNYL (DURAGESIC) 25 MCG/HR 1 patch  1 patch Transdermal Q72H Elgergawy, Silver Huguenin, MD   1 patch at 05/06/22 1327   ferrous sulfate tablet 325 mg  325 mg Oral QODAY Howerter, Justin B, DO   325 mg at 05/08/22 1115   furosemide (LASIX) tablet 40 mg  40 mg Oral Daily Elgergawy, Silver Huguenin, MD   40 mg at 05/08/22 1114   lactulose (CHRONULAC) 10 GM/15ML solution 30 g  30 g Oral Once Quintella Reichert, MD       lactulose (CHRONULAC) 10 GM/15ML solution 30 g  30 g Oral Q4H Elgergawy, Silver Huguenin, MD   30 g at 05/08/22 1115   molnupiravir EUA (LAGEVRIO) capsule 800 mg  4 capsule Oral BID Howerter, Justin B, DO   800 mg at 05/07/22 2115   Oral care mouth rinse  15 mL Mouth Rinse PRN Elgergawy, Silver Huguenin, MD       pantoprazole (PROTONIX) EC tablet 40 mg  40 mg Oral Daily Elgergawy, Silver Huguenin,  MD   40 mg at 05/08/22 1115   spironolactone (ALDACTONE) tablet 25 mg  25 mg Oral Daily Elgergawy, Silver Huguenin, MD   25 mg at 05/08/22 1115   umeclidinium bromide (INCRUSE ELLIPTA) 62.5 MCG/ACT 1 puff  1 puff Inhalation Daily Elgergawy, Silver Huguenin, MD   1 puff at 05/08/22 6967     Discharge Medications: Please see discharge summary for a list of discharge medications.  Relevant Imaging Results:  Relevant Lab Results:   Additional Information SSN [id number] [place], [place]

## 2022-05-08 NOTE — TOC Progression Note (Addendum)
Transition of Care [facility]) - Progression Note    Patient Details  Name: [name] MRN: [id number] Date of Birth: 01/02/1956  Transition of Care [facility]) CM/SW [place], [place] Phone Number: 05/08/2022, 12:12 PM  Clinical Narrative:     CSW called pt's wife to discuss disposition as pt is not oriented to situation. No answer; left voicemail requesting return call.   Per chart review pt's spouse wants pt to go to SNF and is working on [employer]. CSW will see what SNFs may be available. Fl2 completed and bed requests sent.   1535: CSW called pt spouse to discuss dispo. She explains plan was for him to receive respite care at a facility. CSW explained that hospice is unable to do respite care straight from the hospital. CSW explained alternative for STR under pt's medicare benefits which spouse is agreeable to. CSW provided SNF bed offers and medicare star ratings verbally. She works at [employer] and is familiar with SNF's in the area. She chooses [facility]. CSW explains that pt was previously refusing a facility which she understands but is hopeful that he can be talked into it.   1600: CSW confirmed bed offer from [facility] though pt will need to wait until day 11 post-covid positive or be 5 days post-covid positive AND have a negative covid test result. MD notified.   Expected Discharge Plan: [place] Barriers to Discharge: Family Issues, No SNF bed  Expected Discharge Plan and Services Expected Discharge Plan: [place] arrangements for the past 2 months: [place]: Hospice and [place] Date [place]: 05/07/22 Time [place]: 1146 Representative spoke with at [place]: Nicholaus Corolla [facility]   Social Determinants of Health (SDOH) Interventions    Readmission Risk Interventions     No data to display

## 2022-05-08 NOTE — Progress Notes (Signed)
PROGRESS NOTE    [name]  WGN:[id number] DOB: Oct 08, 1955 DOA: 05/03/2022 PCP: Ria Bush, MD   Chief Complaint  Patient presents with   Altered Mental Status    Brief Narrative:     [name] is a 66 y.o. male with medical history significant for cirrhosis secondary to [facility], for which the patient is on hospice, complicated by portal hypertension, hepatic encephalopathy, COPD, chronic pain syndrome, anemia of chronic disease, chronic systolic/diastolic heart failure, chronic thrombocytopenia, who is admitted to [facility] on 05/03/2022 with acute encephalopathy after presenting from home to [facility] ED for evaluation of altered mental status. Patient's wife reportedly diagnosed with COVID-19 infection , The patient has been in close contact with her since that time.  Workup significant for hyperammonemia, COVID-19 positive. -patient is under [facility] care hospice care, he remains a full code.  Assessment & Plan:   Principal Problem:   Acute metabolic encephalopathy Active Problems:   Cirrhosis of liver not due to alcohol (HCC)   Chronic combined systolic and diastolic heart failure (HCC)   COPD (chronic obstructive pulmonary disease) (HCC)   Thrombocytopenia (HCC)   Chronic pain syndrome   Anemia of chronic disease   AKI (acute kidney injury) ([place])   COVID-19 virus infection   Acute metabolic encephalopathy  Acute hepatic encephalopathy Hyperammonemia - multifactorial, due to hepatic encephalopathy/hyperammonemia, COVID-19 infection as well. -CT head on admission with no acute findings -Mentation much improved, appears to be at baseline after decreasing his fentanyl from 75 to 25 mcg, and with his ammonia level improving -Significantly elevated today at 178, this is likely because he refused 2 of his lactulose doses yesterday, after I discussed with him he agreed to take it, will increase his dose today, hopefully it is trending down tomorrow  where he can be discharged home.   AKI -resolved, renal function back to baseline -Resume back on Aldactone, and lasix  Hyponatremia -Mild, at 133, should improve now he is back on his Aldactone and Lasix.  COVID-19 infection -no hypoxia, no indication for steroids, but high risk, so we will continue with molnupiravir  Liver cirrhosis Hyperammonemia Coagulopathy Chronic thrombocytopenia -With history of [place], complicated by portal hypertension. -Elevated INR at 1.4 on admission -Ammonia level is trending down, continue with current dose lactulose Please see above discussion regarding Aldactone and Lasix.  COPD -No wheezing, no indication for steroids, continue with Spiriva, as needed albuterol  Chronic pain syndrome  -Decrease dose of fentanyl patch given altered mental status, will go ahead and decrease further to 25 mcg today.  chronic pancytopenia Anemia of chronic disease -Baseline, continue to monitor closely, so far no indication for transfusion -Continue with SCD for DVT prophylaxis, avoiding pharmacologic DVT prophylaxis   Chronic systolic/diastolic CHF - most recent prior echocardiogram occurring in May 2020, which is notable for LVEF 35 to 40% as well as finding of diastolic dysfunction, with normal right ventricular systolic function  -Appears to be on the dry side on admission, appropriately hydrated with IV fluid, now to resume back on his Lasix and Aldactone.    Obesity  Body mass index is 41.25 kg/m.     Patient with right eyelash wound, improving  on topical antibiotics.   DVT prophylaxis: SCD Code Status: (Full) Family Communication: None at bedside, discussed  with wife by phone today Disposition: To discharge home tomorrow if his ammonia level trending down.   Consultants:  None  Subjective:  Refused lactulose during the day yesterday, but later he  agreed to wait, he has been compliant since.  Objective: Vitals:   05/07/22 1927  05/08/22 0428 05/08/22 0944 05/08/22 1152  BP: (!) 110/46 113/65  128/60  Pulse: 92 86  (!) 104  Resp: 18 18  18   Temp: 98.3 F (36.8 C) 98.3 F (36.8 C)  98 F (36.7 C)  TempSrc: Oral Oral  Oral  SpO2: 96% 94% 96% 94%  Weight:  126.7 kg    Height:        Intake/Output Summary (Last 24 hours) at 05/08/2022 1242 Last data filed at 05/08/2022 0434 Gross per 24 hour  Intake 360 ml  Output 550 ml  Net -190 ml   Filed Weights   05/06/22 0646 05/07/22 0338 05/08/22 0428  Weight: 127.3 kg 126.1 kg 126.7 kg    Examination:  Patient is awake, alert, appropriate, a frail, deconditioned Symmetrical Chest wall movement, Good air movement bilaterally, CTAB RRR,No Gallops,Rubs or new Murmurs, No Parasternal Heave +ve B.Sounds, Abd Soft, No tenderness, No rebound - guarding or rigidity. No Cyanosis, Clubbing or edema, No new Rash or bruise       Data Reviewed: I have personally reviewed following labs and imaging studies  CBC: Recent Labs  Lab 05/03/22 2123 05/03/22 2132 05/04/22 0532 05/05/22 0124 05/06/22 0032  WBC 6.6  --  5.6 6.0 4.8  NEUTROABS 4.7  --  3.8  --   --   HGB 12.2* 12.9* 11.6* 11.5* 10.4*  HCT 37.0* 38.0* 34.6* 33.4* 31.6*  MCV 100.8*  --  100.3* 99.1 101.0*  PLT 48*  --  46* 37* 30*    Basic Metabolic Panel: Recent Labs  Lab 05/03/22 2123 05/03/22 2132 05/04/22 0800 05/05/22 0124 05/06/22 0032  NA 135 137 138 135 133*  K 4.1 4.2 4.1 3.9 4.2  CL 98  --  103 102 102  CO2 25  --  26 25 25   GLUCOSE 107*  --  89 102* 118*  BUN 50*  --  47* 36* 20  CREATININE 2.37*  --  1.79* 1.35* 1.05  CALCIUM 8.6*  --  8.6* 8.4* 8.3*  MG  --   --  2.2  --   --     GFR: Estimated Creatinine Clearance: 89.9 mL/min (by C-G formula based on SCr of 1.05 mg/dL).  Liver Function Tests: Recent Labs  Lab 05/03/22 2123 05/04/22 0800 05/05/22 0124  AST 39 41 56*  ALT 23 24 24   ALKPHOS 133* 120 115  BILITOT 2.1* 2.2* 3.0*  PROT 8.3* 7.7 7.6  ALBUMIN 2.5* 2.4*  2.4*    CBG: No results for input(s): "GLUCAP" in the last 168 hours.   Recent Results (from the past 240 hour(s))  Resp Panel by RT-PCR (Flu A&B, Covid) Anterior Nasal Swab     Status: Abnormal   Collection Time: 05/04/22  2:01 AM   Specimen: Anterior Nasal Swab  Result Value Ref Range Status   SARS Coronavirus 2 by RT PCR POSITIVE (A) NEGATIVE Final    Comment: (NOTE) SARS-CoV-2 target nucleic acids are DETECTED.  The SARS-CoV-2 RNA is generally detectable in upper respiratory specimens during the acute phase of infection. Positive results are indicative of the presence of the identified virus, but do not rule out bacterial infection or co-infection with other pathogens not detected by the test. Clinical correlation with patient history and other diagnostic information is necessary to determine patient infection status. The expected result is Negative.  Fact Sheet for Patients: EntrepreneurPulse.com.au  Fact Sheet for Healthcare  Providers: IncredibleEmployment.be  This test is not yet approved or cleared by the [place] and  has been authorized for detection and/or diagnosis of SARS-CoV-2 by FDA under an Emergency Use Authorization (EUA).  This EUA will remain in effect (meaning this test can be used) for the duration of  the COVID-19 declaration under Section 564(b)(1) of the A ct, 21 U.S.C. section 360bbb-3(b)(1), unless the authorization is terminated or revoked sooner.     Influenza A by PCR NEGATIVE NEGATIVE Final   Influenza B by PCR NEGATIVE NEGATIVE Final    Comment: (NOTE) The Xpert Xpress SARS-CoV-2/FLU/RSV plus assay is intended as an aid in the diagnosis of influenza from Nasopharyngeal swab specimens and should not be used as a sole basis for treatment. Nasal washings and aspirates are unacceptable for Xpert Xpress SARS-CoV-2/FLU/RSV testing.  Fact Sheet for  Patients: EntrepreneurPulse.com.au  Fact Sheet for Healthcare Providers: IncredibleEmployment.be  This test is not yet approved or cleared by the [place] FDA and has been authorized for detection and/or diagnosis of SARS-CoV-2 by FDA under an Emergency Use Authorization (EUA). This EUA will remain in effect (meaning this test can be used) for the duration of the COVID-19 declaration under Section 564(b)(1) of the Act, 21 U.S.C. section 360bbb-3(b)(1), unless the authorization is terminated or revoked.  Performed at [facility], [place] [address] [place], [place] [address]          Radiology Studies: No results found.      Scheduled Meds:  fentaNYL  1 patch Transdermal Q72H   ferrous sulfate  325 mg Oral QODAY   furosemide  40 mg Oral Daily   lactulose  30 g Oral Once   lactulose  30 g Oral Q4H   molnupiravir EUA  4 capsule Oral BID   pantoprazole  40 mg Oral Daily   spironolactone  25 mg Oral Daily   umeclidinium bromide  1 puff Inhalation Daily   Continuous Infusions:     LOS: 4 days       Phillips Climes, MD [facility]   To contact the attending provider between 7A-7P or the covering provider during after hours 7P-7A, please log into the web site [email or web address] and access using universal [place] password for that web site. If you do not have the password, please call the hospital operator.  05/08/2022, 12:42 PM
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
## 2022-05-08 NOTE — Telephone Encounter (Signed)
Patient wife called and stated patient was admitted in the hospital on Thursday and his pneumonia levels where high and she would like a call back at [phone]. Also had to cancel his appointment today.
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
## 2022-05-08 NOTE — Plan of Care (Signed)
  Problem: Activity: Goal: Risk for activity intolerance will decrease Outcome: Progressing   Problem: Safety: Goal: Ability to remain free from injury will improve Outcome: Progressing   

## 2022-05-09 ENCOUNTER — Other Ambulatory Visit (HOSPITAL_COMMUNITY): Payer: Self-pay

## 2022-05-09 DIAGNOSIS — U071 COVID-19: Secondary | ICD-10-CM | POA: Diagnosis not present

## 2022-05-09 DIAGNOSIS — G9341 Metabolic encephalopathy: Secondary | ICD-10-CM | POA: Diagnosis not present

## 2022-05-09 DIAGNOSIS — K7460 Unspecified cirrhosis of liver: Secondary | ICD-10-CM | POA: Diagnosis not present

## 2022-05-09 LAB — AMMONIA: Ammonia: 158 umol/L — ABNORMAL HIGH (ref 9–35)

## 2022-05-09 MED ORDER — LACTULOSE 20 GM/30ML PO SOLN: 20.0000 g | Freq: Four times a day (QID) | ORAL | 2 refills | Status: AC

## 2022-05-09 MED ORDER — FENTANYL 25 MCG/HR TD PT72
1.0000 | MEDICATED_PATCH | TRANSDERMAL | 0 refills | Status: DC
  Filled 2022-05-09: qty 5, 15d supply, fill #0

## 2022-05-09 MED ORDER — ONDANSETRON 4 MG PO TBDP
4.0000 mg | ORAL_TABLET | Freq: Three times a day (TID) | ORAL | Status: DC | PRN
  Filled 2022-05-09: qty 1

## 2022-05-09 MED ORDER — SPIRONOLACTONE 100 MG PO TABS: 50.0000 mg | ORAL_TABLET | Freq: Every day | ORAL | 6 refills | Status: DC

## 2022-05-09 MED ORDER — FENTANYL 25 MCG/HR TD PT72: 1.0000 | MEDICATED_PATCH | TRANSDERMAL | 0 refills | Status: DC

## 2022-05-09 MED ORDER — MELATONIN 3 MG PO TABS
3.0000 mg | ORAL_TABLET | Freq: Every evening | ORAL | Status: DC | PRN
  Administered 2022-05-09: 3 mg via ORAL
  Filled 2022-05-09: qty 1

## 2022-05-09 MED ORDER — FUROSEMIDE 40 MG PO TABS: 40.0000 mg | ORAL_TABLET | Freq: Every day | ORAL | 1 refills | Status: DC

## 2022-05-09 NOTE — Progress Notes (Signed)
TRH night cross cover note:   I was notified by RN of the patient's request for a sleep aid. I subsequently placed order for prn melatonin for insomnia.     Babs Bertin, DO Hospitalist

## 2022-05-09 NOTE — Progress Notes (Signed)
Physical Therapy Treatment Patient Details Name: [name] MRN: [id number] DOB: Aug 20, 1955 Today's Date: 05/09/2022   History of Present Illness Pt is a 66 y.o. male who presented 05/03/22 with AMS, admitted with acute metabolic and hepatic encephalopathy, hyperammonemia, AKI, and COVID-19 infection. Of note, pt recently admitted 04/15/22-04/17/22 due to fall and was treated for hepatic encephalopathy. PMH: cirrhosis 2/2 NASH on Hospice, portal hypertension, hepatic encephalopathy, COPD, chronic pain syndrome, anemia, chronic systolic/diastolic heart failure, chronic thrombocytopenia, AAA, CAD, [place]ie tooth muscular atrophy, gout, hepatitis, hx of MI, HLD, HTN, DM2    PT Comments    Pt on BSC upon PT arrival to room, requires PT assist to clean up and get off BSC. Pt overall requiring min-mod physical assist for repeated tranfers and short-distance gait in room, pt limited by weakness and fatigue. PT continuing to recommend home with family support, pt states he is unsure of family support at this time.     Recommendations for follow up therapy are one component of a multi-disciplinary discharge planning process, led by the attending physician.  Recommendations may be updated based on patient status, additional functional criteria and insurance authorization.  Follow Up Recommendations  Home health PT (pending available assist and home situation, otherwise may need SNF)     Assistance Recommended at Discharge Frequent or constant Supervision/Assistance  Patient can return home with the following A little help with walking and/or transfers;A little help with bathing/dressing/bathroom;Assistance with cooking/housework;Direct supervision/assist for medications management;Direct supervision/assist for financial management;Assist for transportation;Help with stairs or ramp for entrance   Equipment Recommendations  Rollator (4 wheels);BSC/3in1    Recommendations for Other Services        Precautions / Restrictions Precautions Precautions: Fall Restrictions Weight Bearing Restrictions: No     Mobility  Bed Mobility Overal bed mobility: Needs Assistance Bed Mobility: Sit to Supine       Sit to supine: Mod assist   General bed mobility comments: pt sitting on BSC upon PT arrival to room, mod assist for lift LEs into bed and repositioning shoulders once supine.    Transfers Overall transfer level: Needs assistance Equipment used: Rolling walker (2 wheels) Transfers: Sit to/from Stand, Bed to chair/wheelchair/BSC Sit to Stand: Min assist Stand pivot transfers: Min assist         General transfer comment: assist for initial power up and steadying, STS x2 from [facility] and EOB.    Ambulation/Gait Ambulation/Gait assistance: Min assist Gait Distance (Feet): 15 Feet Assistive device: Rolling walker (2 wheels) Gait Pattern/deviations: Decreased stride length, Step-through pattern, Shuffle, Trunk flexed Gait velocity: decr     General Gait Details: assist to steady and maneuver RW   Stairs             Wheelchair Mobility    Modified Rankin (Stroke Patients Only)       Balance Overall balance assessment: Needs assistance Sitting-balance support: No upper extremity supported, Feet supported Sitting balance-Leahy Scale: Fair Sitting balance - Comments: Static sitting EOB with supervision for safety   Standing balance support: Bilateral upper extremity supported, During functional activity, Reliant on assistive device for balance Standing balance-Leahy Scale: Poor Standing balance comment: Reliant on RW and up to [employer] Arousal/Alertness: Awake/alert Behavior During Therapy: [facility] for tasks assessed/performed Overall Cognitive Status: No family/caregiver present to determine baseline cognitive functioning  Exercises      General  Comments        Pertinent Vitals/Pain Pain Assessment Pain Assessment: Faces Faces Pain Scale: Hurts little more Pain Location: LEs Pain Descriptors / Indicators: Discomfort, Sore Pain Intervention(s): Limited activity within patient's tolerance, Monitored during session    Home Living                          Prior Function            PT Goals (current goals can now be found in the care plan section) Acute Rehab PT Goals Patient Stated Goal: to feel better PT Goal Formulation: With patient Time For Goal Achievement: 05/19/22 Potential to Achieve Goals: Good Progress towards PT goals: Progressing toward goals    Frequency    Min 3X/week      PT Plan Current plan remains appropriate    Co-evaluation              AM-PAC PT "6 Clicks" Mobility   Outcome Measure  Help needed turning from your back to your side while in a flat bed without using bedrails?: A Little Help needed moving from lying on your back to sitting on the side of a flat bed without using bedrails?: A Lot Help needed moving to and from a bed to a chair (including a wheelchair)?: A Lot Help needed standing up from a chair using your arms (e.g., wheelchair or bedside chair)?: A Little Help needed to walk in hospital room?: A Little Help needed climbing 3-5 steps with a railing? : Total 6 Click Score: 14    End of Session   Activity Tolerance: Patient tolerated treatment well;Patient limited by fatigue Patient left: in bed;with bed alarm set;with call bell/phone within reach Nurse Communication: Mobility status PT Visit Diagnosis: Muscle weakness (generalized) (M62.81);Unsteadiness on feet (R26.81);Other abnormalities of gait and mobility (R26.89);History of falling (Z91.81);Difficulty in walking, not elsewhere classified (R26.2)     Time: 0912-0925 PT Time Calculation (min) (ACUTE ONLY): 13 min  Charges:  $Gait Training: 8-22 mins                    Stacie Glaze, [name] Acute  Rehabilitation Services Pager [phone]  Office [phone]    [place] 05/09/2022, 1:41 PM

## 2022-05-09 NOTE — Consult Note (Addendum)
   [facility] Inpatient Consult   05/09/2022  [name] [birth year] [id number]  [place] Organization [ACO] Patient: [facility] HMO [patient is active with Hospice]  Chart reviewed and reveals the patient is currently with Hospice/Palliative Care with [facility]. Chart reviewed for ongoing hospice care and spoke with [name] regarding ongoing care and confirmed,  Plan: Patient will have full case management services through Hospice and needs will be met at the hospice level of care. No [facility] Care Management is planned for transitional needs.  For questions,   Natividad Brood, RN BSN [place]  [phone] business mobile phone Toll free office [phone]  *[place]  [phone] Fax number: [phone] [name]
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
## 2022-05-09 NOTE — Care Management Important Message (Signed)
Important Message  Patient Details  Name: DEVEN FURIA MRN: [id number] Date of Birth: Feb 20, 1956   Medicare Important Message Given:  Yes     Shelda Altes 05/09/2022, 9:20 AM

## 2022-05-09 NOTE — Progress Notes (Signed)
TRH night cross cover note:   I was notified by RN that patient is refusing his current dose of lactulose as he feels nauseous.  He does not currently have IV access, and does not have order for prn antiemetic.  I subsequently placed order for prn Zofran ODT.    Babs Bertin, DO Hospitalist

## 2022-05-09 NOTE — Progress Notes (Signed)
Patient complained of nausea from lactulose and refused 4am dose. Provider notified for PRN  nausea medication. Also, no IV access is ok with provider.

## 2022-05-09 NOTE — Progress Notes (Signed)
Patient requests something to help him sleep tonight. Provider notified.

## 2022-05-09 NOTE — TOC Transition Note (Addendum)
Transition of Care [facility]) - CM/SW Discharge Note   Patient Details  Name: [name] MRN: [id number] Date of Birth: Jun 11, 1955  Transition of Care [facility]) CM/SW Contact:  Ninfa Meeker, RN Phone Number: 05/09/2022, 1:15 PM   Clinical Narrative:    Case manager spoke with patient this morning concerning discharging home versus SNF. Patient adamant that he will not go to SNF, has key to his house and wants to go home. CM received permission to arrange for Home Health RN/PT, referral called to [facility] with [facility]. Per conversations with Social worker, patient's wife has objected to patient being dc'd to home, but we can not force him to a skilled facility.  1:15pm - Case Manager has arranged transport home for patient with [facility]. Transport should arrive at room at 2:00pm or after, depending on truck availability. Meds have been sent to [facility], will be delivered to patient prior to discharge.   2:05pm - [place] canceled, patient will continue with Hospice services.   Final next level of care: [place] Barriers to Discharge: Family Issues, No SNF bed   Patient Goals and CMS Choice Patient states their goals for this hospitalization and ongoing recovery are:: patient wants to go back home w established home hospice   Choice offered to / list presented to : Patient  Discharge Placement                       Discharge Plan and Services   Discharge Planning Services: CM Consult Post Acute Care Choice: Home Health                    HH Arranged: RN [facility] Agency: [facility] Date [place]: 05/09/22 Time [place] Agency Contacted: [id number] Representative spoke with at [place]: Nicholaus Corolla [facility]  Social Determinants of Health (SDOH) Interventions     Readmission Risk Interventions     No data to display

## 2022-05-09 NOTE — Progress Notes (Signed)
[facility]) Hospital Liaison note   Mr. Bostock no longer meets GIP level of care, and he is now routine level of care.    We will continue to follow for discharge disposition.   Hospital staff and [facility] aware.  Please call with any questions.    Thank you, Zigmund Gottron, [facility] liaison [phone]

## 2022-05-09 NOTE — Discharge Instructions (Signed)
Follow with Primary MD Ria Bush, MD in 7 days   Get CBC, CMP, checked  by Primary MD next visit.    Activity: As tolerated with Full fall precautions use walker/cane & assistance as needed   Disposition Home    Diet: Heart Healthy, ow sodium   On your next visit with your primary care physician please Get Medicines reviewed and adjusted.   Please request your Prim.MD to go over all Hospital Tests and Procedure/Radiological results at the follow up, please get all Hospital records sent to your Prim MD by signing hospital release before you go home.   If you experience worsening of your admission symptoms, develop shortness of breath, life threatening emergency, suicidal or homicidal thoughts you must seek medical attention immediately by calling 911 or calling your MD immediately  if symptoms less severe.  You Must read complete instructions/literature along with all the possible adverse reactions/side effects for all the Medicines you take and that have been prescribed to you. Take any new Medicines after you have completely understood and accpet all the possible adverse reactions/side effects.   Do not drive, operating heavy machinery, perform activities at heights, swimming or participation in water activities or provide baby sitting services if your were admitted for syncope or siezures until you have seen by Primary MD or a Neurologist and advised to do so again.  Do not drive when taking Pain medications.    Do not take more than prescribed Pain, Sleep and Anxiety Medications  Special Instructions: If you have smoked or chewed Tobacco  in the last 2 yrs please stop smoking, stop any regular Alcohol  and or any Recreational drug use.  Wear Seat belts while driving.   Please note  You were cared for by a hospitalist during your hospital stay. If you have any questions about your discharge medications or the care you received while you were in the hospital after you  are discharged, you can call the unit and asked to speak with the hospitalist on call if the hospitalist that took care of you is not available. Once you are discharged, your primary care physician will handle any further medical issues. Please note that NO REFILLS for any discharge medications will be authorized once you are discharged, as it is imperative that you return to your primary care physician (or establish a relationship with a primary care physician if you do not have one) for your aftercare needs so that they can reassess your need for medications and monitor your lab values.

## 2022-05-09 NOTE — Discharge Summary (Signed)
Physician Discharge Summary  [name]:[id number] DOB: 05/09/1956 DOA: 05/03/2022  PCP: Ria Bush, MD  Admit date: 05/03/2022 Discharge date: 05/09/2022  Admitted From: (Home) Disposition:  (Home with Hospice )  Patient was offered SNF, but he was adamant about going home, he is awake, alert x 3, he has capacity, and he made final decision to go home, declined any SNF disposition, case manager, hospice service, and social worker were heavily involved in his discharge planning, patient confirms to me he has key to his house.  Recommendations for Outpatient Follow-up:  Follow up with PCP in 1-2 weeks Please obtain BMP/CBC in one week  Home Health: (YES)  Discharge Condition:  (poor/hospice) CODE STATUS: (FULL) Diet recommendation: Heart Healthy / low salt  Brief/Interim Summary:   [name] is a 66 y.o. male with medical history significant for cirrhosis secondary to [facility], for which the patient is on hospice, complicated by portal hypertension, hepatic encephalopathy, COPD, chronic pain syndrome, anemia of chronic disease, chronic systolic/diastolic heart failure, chronic thrombocytopenia, who is admitted to [facility] on 05/03/2022 with acute encephalopathy after presenting from home to [facility] ED for evaluation of altered mental status. Patient's wife reportedly diagnosed with COVID-19 infection , The patient has been in close contact with her since that time.  Workup significant for encephalopathy  hyperammonemia, COVID-19 positive. -patient is under [facility] care hospice care, he remains a full code.     Acute metabolic encephalopathy  Acute hepatic encephalopathy Hyperammonemia -His altered mental status felt to be multifactorial initially, but I think the most contributing factor is polypharmacy, where he is on oxycodone as needed, fentanyl patch 75 mcg, Pristiq and Abilify, all these meds has been held, and his fentanyl patch dose has been  lowered to 25 mcg, for which his altered mental status has totally resolved, he is currently back to baseline, so he will be discharged on lower dose fentanyl patch with recommendation to hold oxycodone, Abilify and Pristiq . -Had hyperammonemia, he has been compliant with his lactulose with multiple bowel movement during hospital stay, despite that his ammonia level remains significantly elevated, but despite that his mentation was at baseline, where he was awake, alert and appropriate despite hyperammonemia. -CT head on admission with no acute findings -Mentation much improved, appears to be at baseline after decreasing his fentanyl from 75 to 25 mcg, and with his ammonia level improving     AKI -resolved, renal function back to baseline -Resume back on Aldactone, and lasix, but dose is much lowered with Aldactone going down from 200 to 50 mg oral daily, and Lasix going from 40 twice daily to once daily.[name]   Hyponatremia -Mild, at 133, should improve now he is back on his Aldactone and Lasix (lower dose)   COVID-19 infection -no hypoxia, no indication for steroids, but high risk, so we he was treated  with molnupiravir   Liver cirrhosis Hyperammonemia Coagulopathy Chronic thrombocytopenia -With history of [place], complicated by portal hypertension. -Elevated INR at 1.4 on admission -Continue with Lasix and Aldactone(lower dose) -lactulose has been increased on discharge   COPD -No wheezing, no indication for steroids, continue with Spiriva, as needed albuterol   Chronic pain syndrome  -Decrease dose of fentanyl patch given altered mental status, will dc on fentanyl patch 25 mcg.  Oxycodone as well has been discontinued on discharge.   chronic pancytopenia Anemia of chronic disease -Baseline, continue to monitor closely, so far no indication for transfusion -Continue with SCD for DVT prophylaxis,  avoiding pharmacologic DVT prophylaxis     Chronic systolic/diastolic CHF - most  recent prior echocardiogram occurring in May 2020, which is notable for LVEF 35 to 40% as well as finding of diastolic dysfunction, with normal right ventricular systolic function  -Appears to be on the dry side on admission, appropriately hydrated with IV fluid, now to resume back on his Lasix and Aldactone.(Lower dose)     Obesity  Body mass index is 41.25 kg/m.       Discharge Diagnoses:  Principal Problem:   Acute metabolic encephalopathy Active Problems:   Cirrhosis of liver not due to alcohol (HCC)   Chronic combined systolic and diastolic heart failure (HCC)   COPD (chronic obstructive pulmonary disease) (HCC)   Thrombocytopenia (HCC)   Chronic pain syndrome   Anemia of chronic disease   AKI (acute kidney injury) ([place])   COVID-19 virus infection    Discharge Instructions  Discharge Instructions     Diet - low sodium heart healthy   Complete by: As directed    Discharge instructions   Complete by: As directed    Follow with Primary MD Ria Bush, MD in 7 days   Get CBC, CMP, checked  by Primary MD next visit.    Activity: As tolerated with Full fall precautions use walker/cane & assistance as needed   Disposition Home    Diet: Heart Healthy, ow sodium   On your next visit with your primary care physician please Get Medicines reviewed and adjusted.   Please request your Prim.MD to go over all Hospital Tests and Procedure/Radiological results at the follow up, please get all Hospital records sent to your Prim MD by signing hospital release before you go home.   If you experience worsening of your admission symptoms, develop shortness of breath, life threatening emergency, suicidal or homicidal thoughts you must seek medical attention immediately by calling 911 or calling your MD immediately  if symptoms less severe.  You Must read complete instructions/literature along with all the possible adverse reactions/side effects for all the Medicines you take  and that have been prescribed to you. Take any new Medicines after you have completely understood and accpet all the possible adverse reactions/side effects.   Do not drive, operating heavy machinery, perform activities at heights, swimming or participation in water activities or provide baby sitting services if your were admitted for syncope or siezures until you have seen by Primary MD or a Neurologist and advised to do so again.  Do not drive when taking Pain medications.    Do not take more than prescribed Pain, Sleep and Anxiety Medications  Special Instructions: If you have smoked or chewed Tobacco  in the last 2 yrs please stop smoking, stop any regular Alcohol  and or any Recreational drug use.  Wear Seat belts while driving.   Please note  You were cared for by a hospitalist during your hospital stay. If you have any questions about your discharge medications or the care you received while you were in the hospital after you are discharged, you can call the unit and asked to speak with the hospitalist on call if the hospitalist that took care of you is not available. Once you are discharged, your primary care physician will handle any further medical issues. Please note that NO REFILLS for any discharge medications will be authorized once you are discharged, as it is imperative that you return to your primary care physician (or establish a relationship with a primary care physician  if you do not have one) for your aftercare needs so that they can reassess your need for medications and monitor your lab values.   Increase activity slowly   Complete by: As directed       Allergies as of 05/09/2022       Reactions   Statins Shortness Of Breath   Cough, trouble breathing Cough, trouble breathing   Losartan Other (See Comments)   Causes him to have pain   Wellbutrin [bupropion] Other (See Comments)   Worsened mood - crying   Allopurinol Nausea Only   Baclofen Nausea And Vomiting    Penicillins Nausea And Vomiting   Did it involve swelling of the face/tongue/throat, SOB, or low BP? N/A Did it involve sudden or severe rash/hives, skin peeling, or any reaction on the inside of your mouth or nose? N/A Did you need to seek medical attention at a hospital or doctor's office? N/A When did it last happen? Child     If all above answers are "NO", may proceed with cephalosporin use.   Tramadol Nausea Only        Medication List     STOP taking these medications    ARIPiprazole 5 MG tablet Commonly known as: ABILIFY   desvenlafaxine 25 MG 24 hr tablet Commonly known as: PRISTIQ   fentaNYL 75 MCG/HR Commonly known as: DURAGESIC Replaced by: fentaNYL 25 MCG/HR   losartan 25 MG tablet Commonly known as: COZAAR   oxyCODONE 5 MG immediate release tablet Commonly known as: Oxy IR/ROXICODONE       TAKE these medications    albuterol (2.5 MG/3ML) 0.083% nebulizer solution Commonly known as: PROVENTIL USE 1 VIAL PER NEBULIZER EVERY 6 HRS AS NEEDED FOR WHEEZING What changed:  how much to take how to take this when to take this reasons to take this additional instructions   albuterol 108 (90 Base) MCG/ACT inhaler Commonly known as: VENTOLIN HFA TAKE 2 PUFFS BY MOUTH EVERY 6 HOURS AS NEEDED FOR WHEEZE OR SHORTNESS OF BREATH What changed: See the new instructions.   cyanocobalamin 500 MCG tablet Commonly known as: VITAMIN B12 Take 1 tablet (500 mcg total) by mouth every Monday, Wednesday, and Friday.   fentaNYL 25 MCG/HR Commonly known as: [place] 1 patch onto the skin every 3 (three) days. Replaces: fentaNYL 75 MCG/HR   ferrous sulfate 325 (65 FE) MG tablet Take 1 tablet (325 mg total) by mouth every other day.   folic acid 1 MG tablet Commonly known as: FOLVITE TAKE 1 TABLET BY MOUTH EVERY DAY   furosemide 40 MG tablet Commonly known as: LASIX Take 1 tablet (40 mg total) by mouth daily. What changed: when to take this   gabapentin 300  MG capsule Commonly known as: NEURONTIN Take 1 capsule (300 mg total) by mouth in the morning and at bedtime.   Lactulose 20 GM/30ML Soln Take 30 mLs (20 g total) by mouth in the morning, at noon, in the evening, and at bedtime. What changed: when to take this   nicotine 14 mg/24hr patch Commonly known as: NICODERM CQ - dosed in mg/24 hours APPLY 1 PATCH ONTO THE SKIN EVERY DAY   nitroGLYCERIN 0.4 MG SL tablet Commonly known as: NITROSTAT Place 1 tablet (0.4 mg total) under the tongue every 5 (five) minutes as needed for chest pain.   nystatin cream Commonly known as: MYCOSTATIN APPLY TO AFFECTED AREA TWICE A DAY   omeprazole 40 MG capsule Commonly known as: PRILOSEC TAKE 1 CAPSULE BY  MOUTH EVERY DAY What changed: how much to take   ondansetron 4 MG tablet Commonly known as: ZOFRAN TAKE 1 TABLET BY MOUTH EVERY 8 HOURS AS NEEDED FOR NAUSEA AND VOMITING   Spiriva Respimat 2.5 MCG/ACT Aers Generic drug: Tiotropium Bromide Monohydrate Inhale 2 puffs into the lungs daily.   spironolactone 100 MG tablet Commonly known as: ALDACTONE Take 0.5 tablets (50 mg total) by mouth daily. What changed:  how much to take when to take this   [place] Take 1 capsule by mouth 2 (two) times daily.        [place] [place] [facility] Follow up.   Specialty: [place] Why: A representative from [place] will contact you to arrange start date and time for your therapy. Contact information: [place] [address] [phone]                Allergies  Allergen Reactions   Statins Shortness Of Breath    Cough, trouble breathing Cough, trouble breathing   Losartan Other (See Comments)    Causes him to have pain   Wellbutrin [Bupropion] Other (See Comments)    Worsened mood - crying   Allopurinol Nausea Only   Baclofen Nausea And Vomiting   Penicillins Nausea And Vomiting    Did it  involve swelling of the face/tongue/throat, SOB, or low BP? N/A Did it involve sudden or severe rash/hives, skin peeling, or any reaction on the inside of your mouth or nose? N/A Did you need to seek medical attention at a hospital or doctor's office? N/A When did it last happen? Child     If all above answers are "NO", may proceed with cephalosporin use.   Tramadol Nausea Only    Consultations: none   Procedures/Studies: CT ABDOMEN PELVIS WO CONTRAST  Result Date: 05/04/2022 CLINICAL DATA:  Confusion.  End-stage liver disease.  Abdominal pain EXAM: CT ABDOMEN AND PELVIS WITHOUT CONTRAST TECHNIQUE: Multidetector CT imaging of the abdomen and pelvis was performed following the standard protocol without IV contrast. RADIATION DOSE REDUCTION: This exam was performed according to the departmental dose-optimization program which includes automated exposure control, adjustment of the mA and/or kV according to patient size and/or use of iterative reconstruction technique. COMPARISON:  CT abdomen and pelvis 01/31/2022 FINDINGS: Lower chest: No acute abnormality. Hepatobiliary: Nodular liver contour compatible with cirrhosis. Unremarkable gallbladder. Similar dilation of the common bile duct measuring 1.2 cm. Pancreas: No acute abnormality. Punctate calcifications in the pancreatic head. No pancreatic ductal dilatation. Spleen: Splenomegaly. Adrenals/Urinary Tract: Normal adrenal glands. No urinary calculi or hydronephrosis. Unremarkable bladder. Stomach/Bowel: Normal caliber large and small bowel. No bowel wall thickening. Normal appendix. Unremarkable stomach. Vascular/Lymphatic: Aortic atherosclerosis. Suprarenal abdominal aortic aneurysm measuring 3.7 cm. Aorto bi-iliac stent. The stents extend into the bilateral renal arteries. Stent patency can not be assessed on noncontrast exam. Upper abdominal varices. Similar prominent aortocaval and inguinal lymph nodes. Reproductive: Prostate is unremarkable.  Other: No free intraperitoneal fluid or air. Musculoskeletal: Right THA. Compression fracture deformity of L4 is unchanged. IMPRESSION: Cirrhosis with sequela of portal hypertension. Chronic pancreatitis. Unchanged suprarenal abdominal aortic aneurysm measuring 3.7 cm Aortic Atherosclerosis (ICD10-I70.0). Electronically Signed   By: Placido Sou M.D.   On: 05/04/2022 00:17   CT HEAD WO CONTRAST (5MM)  Result Date: 05/04/2022 CLINICAL DATA:  Altered mental status. EXAM: CT HEAD WITHOUT CONTRAST TECHNIQUE: Contiguous axial images were obtained from the base of  the skull through the vertex without intravenous contrast. RADIATION DOSE REDUCTION: This exam was performed according to the departmental dose-optimization program which includes automated exposure control, adjustment of the mA and/or kV according to patient size and/or use of iterative reconstruction technique. COMPARISON:  April 15, 2022 FINDINGS: Brain: There is mild cerebral atrophy with widening of the extra-axial spaces and ventricular dilatation. There are areas of decreased attenuation within the white matter tracts of the supratentorial brain, consistent with microvascular disease changes. Vascular: No hyperdense vessel or unexpected calcification. Skull: Normal. Negative for fracture or focal lesion. Sinuses/Orbits: No acute finding. Other: Mild right frontal scalp soft tissue swelling is seen. IMPRESSION: 1. No acute intracranial abnormality. 2. Mild right frontal scalp soft tissue swelling. 3. Generalized cerebral atrophy with chronic white matter small vessel ischemic changes. Electronically Signed   By: Virgina Norfolk M.D.   On: 05/04/2022 00:08   CT Head Wo Contrast  Result Date: 04/15/2022 CLINICAL DATA:  Altered mental status following an unwitnessed fall. Increasing confusion over the past 2 weeks. EXAM: CT HEAD WITHOUT CONTRAST TECHNIQUE: Contiguous axial images were obtained from the base of the skull through the vertex  without intravenous contrast. RADIATION DOSE REDUCTION: This exam was performed according to the departmental dose-optimization program which includes automated exposure control, adjustment of the mA and/or kV according to patient size and/or use of iterative reconstruction technique. COMPARISON:  07/14/2020 FINDINGS: Brain: Stable mildly enlarged ventricles and cortical sulci. Stable minimal patchy white matter low density in both cerebral hemispheres. Stable small homogeneously fat density mass superior to the basilar artery tip, compatible with a small incidental lipoma. No intracranial hemorrhage, new mass lesion or CT evidence of acute infarction. Vascular: No hyperdense vessel or unexpected calcification. Skull: Normal. Negative for fracture or focal lesion. Sinuses/Orbits: Unremarkable. Other: Deviation of the midportion of the nasal septum to the right. IMPRESSION: 1. No acute abnormality. 2. Stable mild diffuse cerebral and cerebellar atrophy and minimal chronic small vessel white matter ischemic changes in both cerebral hemispheres. Electronically Signed   By: Claudie Revering M.D.   On: 04/15/2022 13:06   DG Chest Port 1 View  Result Date: 04/15/2022 CLINICAL DATA:  Unwitnessed fall. EXAM: PORTABLE CHEST 1 VIEW COMPARISON:  01/31/2022 FINDINGS: The cardio pericardial silhouette is enlarged. There is pulmonary vascular congestion without overt pulmonary edema. The lungs are clear without focal pneumonia, edema, pneumothorax or pleural effusion. The visualized bony structures of the thorax are unremarkable. Telemetry leads overlie the chest. IMPRESSION: Enlargement of the cardiopericardial silhouette with pulmonary vascular congestion. Electronically Signed   By: Misty Stanley M.D.   On: 04/15/2022 11:24      Subjective:  Reports notable loose bowel movements. Discharge Exam: Vitals:   05/09/22 0340 05/09/22 1156  BP: (!) 152/92 135/73  Pulse: 94 96  Resp: 18 20  Temp: 98.4 F (36.9 C) 99 F  (37.2 C)  SpO2: 94% 94%   Vitals:   05/08/22 1943 05/08/22 1945 05/09/22 0340 05/09/22 1156  BP:  120/63 (!) 152/92 135/73  Pulse:  94 94 96  Resp:  20 18 20   Temp: 98.8 F (37.1 C)  98.4 F (36.9 C) 99 F (37.2 C)  TempSrc: Oral  Oral Oral  SpO2:  93% 94% 94%  Weight:   124.7 kg   Height:        General: Pt is alert, awake, not in acute distress Cardiovascular: RRR, S1/S2 +, no rubs, no gallops Respiratory: CTA bilaterally, no wheezing, no rhonchi Abdominal: Soft, NT,  ND, bowel sounds + Extremities: no edema, no cyanosis    The results of significant diagnostics from this hospitalization (including imaging, microbiology, ancillary and laboratory) are listed below for reference.     Microbiology: Recent Results (from the past 240 hour(s))  Resp Panel by RT-PCR (Flu A&B, Covid) Anterior Nasal Swab     Status: Abnormal   Collection Time: 05/04/22  2:01 AM   Specimen: Anterior Nasal Swab  Result Value Ref Range Status   SARS Coronavirus 2 by RT PCR POSITIVE (A) NEGATIVE Final    Comment: (NOTE) SARS-CoV-2 target nucleic acids are DETECTED.  The SARS-CoV-2 RNA is generally detectable in upper respiratory specimens during the acute phase of infection. Positive results are indicative of the presence of the identified virus, but do not rule out bacterial infection or co-infection with other pathogens not detected by the test. Clinical correlation with patient history and other diagnostic information is necessary to determine patient infection status. The expected result is Negative.  Fact Sheet for Patients: EntrepreneurPulse.com.au  Fact Sheet for Healthcare Providers: IncredibleEmployment.be  This test is not yet approved or cleared by the [place] FDA and  has been authorized for detection and/or diagnosis of SARS-CoV-2 by FDA under an Emergency Use Authorization (EUA).  This EUA will remain in effect (meaning this test can  be used) for the duration of  the COVID-19 declaration under Section 564(b)(1) of the A ct, 21 U.S.C. section 360bbb-3(b)(1), unless the authorization is terminated or revoked sooner.     Influenza A by PCR NEGATIVE NEGATIVE Final   Influenza B by PCR NEGATIVE NEGATIVE Final    Comment: (NOTE) The Xpert Xpress SARS-CoV-2/FLU/RSV plus assay is intended as an aid in the diagnosis of influenza from Nasopharyngeal swab specimens and should not be used as a sole basis for treatment. Nasal washings and aspirates are unacceptable for Xpert Xpress SARS-CoV-2/FLU/RSV testing.  Fact Sheet for Patients: EntrepreneurPulse.com.au  Fact Sheet for Healthcare Providers: IncredibleEmployment.be  This test is not yet approved or cleared by the [place] FDA and has been authorized for detection and/or diagnosis of SARS-CoV-2 by FDA under an Emergency Use Authorization (EUA). This EUA will remain in effect (meaning this test can be used) for the duration of the COVID-19 declaration under Section 564(b)(1) of the Act, 21 U.S.C. section 360bbb-3(b)(1), unless the authorization is terminated or revoked.  Performed at [facility], [place] [address], [place], [place] [address]      Labs: BNP (last 3 results) Recent Labs    01/31/22 1520 04/15/22 1024 05/04/22 0534  BNP 169.1* 101.4* 03.8   Basic Metabolic Panel: Recent Labs  Lab 05/03/22 2123 05/03/22 2132 05/04/22 0800 05/05/22 0124 05/06/22 0032  NA 135 137 138 135 133*  K 4.1 4.2 4.1 3.9 4.2  CL 98  --  103 102 102  CO2 25  --  26 25 25   GLUCOSE 107*  --  89 102* 118*  BUN 50*  --  47* 36* 20  CREATININE 2.37*  --  1.79* 1.35* 1.05  CALCIUM 8.6*  --  8.6* 8.4* 8.3*  MG  --   --  2.2  --   --    Liver Function Tests: Recent Labs  Lab 05/03/22 2123 05/04/22 0800 05/05/22 0124  AST 39 41 56*  ALT 23 24 24   ALKPHOS 133* 120 115  BILITOT 2.1* 2.2* 3.0*  PROT 8.3* 7.7 7.6   ALBUMIN 2.5* 2.4* 2.4*   Recent Labs  Lab 05/03/22 2123  LIPASE 41   Recent Labs  Lab 05/05/22 0124 05/06/22 0032 05/07/22 0031 05/08/22 0122 05/09/22 0127  AMMONIA 77* 89* 126* 178* 158*   CBC: Recent Labs  Lab 05/03/22 2123 05/03/22 2132 05/04/22 0532 05/05/22 0124 05/06/22 0032  WBC 6.6  --  5.6 6.0 4.8  NEUTROABS 4.7  --  3.8  --   --   HGB 12.2* 12.9* 11.6* 11.5* 10.4*  HCT 37.0* 38.0* 34.6* 33.4* 31.6*  MCV 100.8*  --  100.3* 99.1 101.0*  PLT 48*  --  46* 37* 30*   Cardiac Enzymes: Recent Labs  Lab 05/04/22 0800  CKTOTAL 332   BNP: Invalid input(s): "POCBNP" CBG: No results for input(s): "GLUCAP" in the last 168 hours. D-Dimer No results for input(s): "DDIMER" in the last 72 hours. Hgb A1c No results for input(s): "HGBA1C" in the last 72 hours. Lipid Profile No results for input(s): "CHOL", "HDL", "LDLCALC", "TRIG", "CHOLHDL", "LDLDIRECT" in the last 72 hours. Thyroid function studies No results for input(s): "TSH", "T4TOTAL", "T3FREE", "THYROIDAB" in the last 72 hours.  Invalid input(s): "FREET3" Anemia work up No results for input(s): "VITAMINB12", "FOLATE", "FERRITIN", "TIBC", "IRON", "RETICCTPCT" in the last 72 hours. Urinalysis    Component Value Date/Time   COLORURINE YELLOW 05/04/2022 0027   APPEARANCEUR HAZY (A) 05/04/2022 0027   APPEARANCEUR Hazy (A) 12/18/2019 1145   LABSPEC 1.008 05/04/2022 0027   PHURINE 6.0 05/04/2022 0027   GLUCOSEU NEGATIVE 05/04/2022 0027   HGBUR NEGATIVE 05/04/2022 0027   BILIRUBINUR NEGATIVE 05/04/2022 0027   BILIRUBINUR 1+ 12/13/2021 1214   BILIRUBINUR Negative 12/18/2019 1145   KETONESUR NEGATIVE 05/04/2022 0027   PROTEINUR NEGATIVE 05/04/2022 0027   UROBILINOGEN 4.0 (A) 12/13/2021 1214   UROBILINOGEN 1.0 03/01/2008 1803   NITRITE NEGATIVE 05/04/2022 0027   LEUKOCYTESUR NEGATIVE 05/04/2022 0027   Sepsis Labs Recent Labs  Lab 05/03/22 2123 05/04/22 0532 05/05/22 0124 05/06/22 0032  WBC 6.6 5.6  6.0 4.8   Microbiology Recent Results (from the past 240 hour(s))  Resp Panel by RT-PCR (Flu A&B, Covid) Anterior Nasal Swab     Status: Abnormal   Collection Time: 05/04/22  2:01 AM   Specimen: Anterior Nasal Swab  Result Value Ref Range Status   SARS Coronavirus 2 by RT PCR POSITIVE (A) NEGATIVE Final    Comment: (NOTE) SARS-CoV-2 target nucleic acids are DETECTED.  The SARS-CoV-2 RNA is generally detectable in upper respiratory specimens during the acute phase of infection. Positive results are indicative of the presence of the identified virus, but do not rule out bacterial infection or co-infection with other pathogens not detected by the test. Clinical correlation with patient history and other diagnostic information is necessary to determine patient infection status. The expected result is Negative.  Fact Sheet for Patients: EntrepreneurPulse.com.au  Fact Sheet for Healthcare Providers: IncredibleEmployment.be  This test is not yet approved or cleared by the [place] FDA and  has been authorized for detection and/or diagnosis of SARS-CoV-2 by FDA under an Emergency Use Authorization (EUA).  This EUA will remain in effect (meaning this test can be used) for the duration of  the COVID-19 declaration under Section 564(b)(1) of the [place], 21 U.S.C. section 360bbb-3(b)(1), unless the authorization is terminated or revoked sooner.     Influenza A by PCR NEGATIVE NEGATIVE Final   Influenza B by PCR NEGATIVE NEGATIVE Final    Comment: (NOTE) The Xpert Xpress SARS-CoV-2/FLU/RSV plus assay is intended as an aid in the diagnosis of influenza from Nasopharyngeal swab specimens  and should not be used as a sole basis for treatment. Nasal washings and aspirates are unacceptable for Xpert Xpress SARS-CoV-2/FLU/RSV testing.  Fact Sheet for Patients: EntrepreneurPulse.com.au  Fact Sheet for Healthcare  Providers: IncredibleEmployment.be  This test is not yet approved or cleared by the [place] FDA and has been authorized for detection and/or diagnosis of SARS-CoV-2 by FDA under an Emergency Use Authorization (EUA). This EUA will remain in effect (meaning this test can be used) for the duration of the COVID-19 declaration under Section 564(b)(1) of the Act, 21 U.S.C. section 360bbb-3(b)(1), unless the authorization is terminated or revoked.  Performed at [facility], [place] [address], [place], [place] [address]      Time coordinating discharge: Over 30 minutes  SIGNED:   Phillips Climes, MD  [facility] 05/09/2022, 1:33 PM Pager   If 7PM-7AM, please contact night-coverage [email or web address]

## 2022-05-10 ENCOUNTER — Telehealth: Payer: Self-pay | Admitting: Family Medicine

## 2022-05-10 NOTE — Telephone Encounter (Signed)
Vivien Rota from [facility] called and stated patient needs a refill on Oxcodone 10MG. Call back number [phone]

## 2022-05-11 NOTE — Telephone Encounter (Signed)
Spoke with Vivien Rota relaying Dr. Synthia Innocent message.  Verbalizes understanding but even though last refill was prescribed by hospice, they still show Dr. Darnell Level as attending so that's why she called.  Vivien Rota will relay this message to see what they decide. In the meantime, she requests I contact pt's wife, [name], about scheduling hosp f/u OV.   Spoke with pt's wife, [name] (on dpr) relaying Dr. Synthia Innocent message.  She states pt already has hosp f/u on 05/22/22.  However, she's pleading for Dr. Darnell Level to go ahead and send refill.

## 2022-05-11 NOTE — Telephone Encounter (Signed)
Spoke with wife at [employer] office visit.

## 2022-05-11 NOTE — Telephone Encounter (Signed)
This was being prescribed by hospice.  I last saw him 01/2022, he no showed 2 appts in interim latest last month.  Also, he recently had hospitalization for altered mental status, and oxycodone was thought to be one of the contributing factors, and it was discontinued. He was discharged on 2 week supply of lower fentanyl 31mg patches.  He last received #90 oxycodone 126mon 04/24/2022 (1 month supply).  I recommend hospital f/u office visit for evaluation prior to continuing oxycodone.

## 2022-05-12 DIAGNOSIS — I5042 Chronic combined systolic (congestive) and diastolic (congestive) heart failure: Secondary | ICD-10-CM | POA: Diagnosis not present

## 2022-05-12 DIAGNOSIS — I87312 Chronic venous hypertension (idiopathic) with ulcer of left lower extremity: Secondary | ICD-10-CM | POA: Diagnosis not present

## 2022-05-12 DIAGNOSIS — D696 Thrombocytopenia, unspecified: Secondary | ICD-10-CM | POA: Diagnosis not present

## 2022-05-12 DIAGNOSIS — R2243 Localized swelling, mass and lump, lower limb, bilateral: Secondary | ICD-10-CM | POA: Diagnosis not present

## 2022-05-12 DIAGNOSIS — I10 Essential (primary) hypertension: Secondary | ICD-10-CM | POA: Diagnosis not present

## 2022-05-12 DIAGNOSIS — R0602 Shortness of breath: Secondary | ICD-10-CM | POA: Diagnosis not present

## 2022-05-12 DIAGNOSIS — M6281 Muscle weakness (generalized): Secondary | ICD-10-CM | POA: Diagnosis not present

## 2022-05-12 DIAGNOSIS — R262 Difficulty in walking, not elsewhere classified: Secondary | ICD-10-CM | POA: Diagnosis not present

## 2022-05-12 DIAGNOSIS — R0609 Other forms of dyspnea: Secondary | ICD-10-CM | POA: Diagnosis not present

## 2022-05-12 DIAGNOSIS — G894 Chronic pain syndrome: Secondary | ICD-10-CM | POA: Diagnosis not present

## 2022-05-12 DIAGNOSIS — R2681 Unsteadiness on feet: Secondary | ICD-10-CM | POA: Diagnosis not present

## 2022-05-12 DIAGNOSIS — L97821 Non-pressure chronic ulcer of other part of left lower leg limited to breakdown of skin: Secondary | ICD-10-CM | POA: Diagnosis not present

## 2022-05-12 DIAGNOSIS — L03119 Cellulitis of unspecified part of limb: Secondary | ICD-10-CM | POA: Diagnosis not present

## 2022-05-12 DIAGNOSIS — F332 Major depressive disorder, recurrent severe without psychotic features: Secondary | ICD-10-CM | POA: Diagnosis not present

## 2022-05-12 DIAGNOSIS — U071 COVID-19: Secondary | ICD-10-CM | POA: Diagnosis not present

## 2022-05-12 DIAGNOSIS — K7460 Unspecified cirrhosis of liver: Secondary | ICD-10-CM | POA: Diagnosis not present

## 2022-05-12 DIAGNOSIS — D638 Anemia in other chronic diseases classified elsewhere: Secondary | ICD-10-CM | POA: Diagnosis not present

## 2022-05-12 DIAGNOSIS — R293 Abnormal posture: Secondary | ICD-10-CM | POA: Diagnosis not present

## 2022-05-12 DIAGNOSIS — I714 Abdominal aortic aneurysm, without rupture, unspecified: Secondary | ICD-10-CM | POA: Diagnosis not present

## 2022-05-12 DIAGNOSIS — I2510 Atherosclerotic heart disease of native coronary artery without angina pectoris: Secondary | ICD-10-CM | POA: Diagnosis not present

## 2022-05-12 DIAGNOSIS — R627 Adult failure to thrive: Secondary | ICD-10-CM | POA: Diagnosis not present

## 2022-05-12 DIAGNOSIS — Z6841 Body Mass Index (BMI) 40.0 and over, adult: Secondary | ICD-10-CM | POA: Diagnosis not present

## 2022-05-12 DIAGNOSIS — J449 Chronic obstructive pulmonary disease, unspecified: Secondary | ICD-10-CM | POA: Diagnosis not present

## 2022-05-12 DIAGNOSIS — R419 Unspecified symptoms and signs involving cognitive functions and awareness: Secondary | ICD-10-CM | POA: Diagnosis not present

## 2022-05-12 DIAGNOSIS — L97811 Non-pressure chronic ulcer of other part of right lower leg limited to breakdown of skin: Secondary | ICD-10-CM | POA: Diagnosis not present

## 2022-05-12 DIAGNOSIS — I87311 Chronic venous hypertension (idiopathic) with ulcer of right lower extremity: Secondary | ICD-10-CM | POA: Diagnosis not present

## 2022-05-12 DIAGNOSIS — K7290 Hepatic failure, unspecified without coma: Secondary | ICD-10-CM | POA: Diagnosis not present

## 2022-05-12 DIAGNOSIS — G8929 Other chronic pain: Secondary | ICD-10-CM | POA: Diagnosis not present

## 2022-05-12 DIAGNOSIS — R918 Other nonspecific abnormal finding of lung field: Secondary | ICD-10-CM | POA: Diagnosis not present

## 2022-05-12 DIAGNOSIS — E440 Moderate protein-calorie malnutrition: Secondary | ICD-10-CM | POA: Diagnosis not present

## 2022-05-12 DIAGNOSIS — B372 Candidiasis of skin and nail: Secondary | ICD-10-CM | POA: Diagnosis not present

## 2022-05-12 DIAGNOSIS — K7581 Nonalcoholic steatohepatitis (NASH): Secondary | ICD-10-CM | POA: Diagnosis not present

## 2022-05-12 DIAGNOSIS — G9341 Metabolic encephalopathy: Secondary | ICD-10-CM | POA: Diagnosis not present

## 2022-05-12 NOTE — Telephone Encounter (Addendum)
We seem to have this conversation with hospice every refill. Can we see what hospice said about filling? I'm happy to speak with hospice provider or staff about patient if needed.   If hospice won't fill I can fill but would want to talk to hospice to see what dose he's been taking as according to [place] controlled substance database he last received #90 oxycodone 88m tablets 04/24/2022, and he was hospitalized for several days in the interim.   I last sent oxycodone 557m#45 for 2 wk supply on 03/17/2022.  He's since received 1059mablets, first #45 for 2 wks then #90 for 2 wks, through hospice.

## 2022-05-12 NOTE — Telephone Encounter (Signed)
Spoke with Vivien Rota to relay Dr. Synthia Innocent message.  However, she says to disregard request at this time due to pt being transferred to [facility]. Vivien Rota is not sure but is under the impression the new facility will handle meds.  Says we should be hearing from them soon.

## 2022-05-15 DIAGNOSIS — F332 Major depressive disorder, recurrent severe without psychotic features: Secondary | ICD-10-CM | POA: Diagnosis not present

## 2022-05-15 DIAGNOSIS — D696 Thrombocytopenia, unspecified: Secondary | ICD-10-CM | POA: Diagnosis not present

## 2022-05-15 DIAGNOSIS — I2510 Atherosclerotic heart disease of native coronary artery without angina pectoris: Secondary | ICD-10-CM | POA: Diagnosis not present

## 2022-05-15 DIAGNOSIS — K7460 Unspecified cirrhosis of liver: Secondary | ICD-10-CM | POA: Diagnosis not present

## 2022-05-15 DIAGNOSIS — D638 Anemia in other chronic diseases classified elsewhere: Secondary | ICD-10-CM | POA: Diagnosis not present

## 2022-05-15 DIAGNOSIS — G894 Chronic pain syndrome: Secondary | ICD-10-CM | POA: Diagnosis not present

## 2022-05-15 DIAGNOSIS — I10 Essential (primary) hypertension: Secondary | ICD-10-CM | POA: Diagnosis not present

## 2022-05-15 DIAGNOSIS — I5042 Chronic combined systolic (congestive) and diastolic (congestive) heart failure: Secondary | ICD-10-CM | POA: Diagnosis not present

## 2022-05-15 DIAGNOSIS — J449 Chronic obstructive pulmonary disease, unspecified: Secondary | ICD-10-CM | POA: Diagnosis not present

## 2022-05-16 ENCOUNTER — Inpatient Hospital Stay: Payer: Medicare HMO | Admitting: Family Medicine

## 2022-05-17 ENCOUNTER — Telehealth: Payer: Self-pay | Admitting: Family Medicine

## 2022-05-17 NOTE — Telephone Encounter (Signed)
Rtn pt's wife's, [name] (on dpr) call. States she had pt transferred to [facility]. States she wants pt there for long-term care. Wants Dr. Synthia Innocent help to declare pt incompetent and to have pt stay there because she can't take care of him at home anymore. Plz advise.

## 2022-05-17 NOTE — Telephone Encounter (Signed)
Patient wife called in and stated that patient [name]. They are needing to get in contact with [name] regarding his appointment on the 12/18. She stated that she had some other questions for [name] also. She stated that she can be reached at [phone]. Please advise.

## 2022-05-19 DIAGNOSIS — K7290 Hepatic failure, unspecified without coma: Secondary | ICD-10-CM | POA: Diagnosis not present

## 2022-05-19 DIAGNOSIS — K7460 Unspecified cirrhosis of liver: Secondary | ICD-10-CM | POA: Diagnosis not present

## 2022-05-19 DIAGNOSIS — I5042 Chronic combined systolic (congestive) and diastolic (congestive) heart failure: Secondary | ICD-10-CM | POA: Diagnosis not present

## 2022-05-19 DIAGNOSIS — J449 Chronic obstructive pulmonary disease, unspecified: Secondary | ICD-10-CM | POA: Diagnosis not present

## 2022-05-22 ENCOUNTER — Telehealth: Payer: Self-pay | Admitting: Family Medicine

## 2022-05-22 ENCOUNTER — Encounter: Payer: Self-pay | Admitting: Family Medicine

## 2022-05-22 ENCOUNTER — Ambulatory Visit (INDEPENDENT_AMBULATORY_CARE_PROVIDER_SITE_OTHER): Payer: Medicare HMO | Admitting: Family Medicine

## 2022-05-22 VITALS — BP 130/68 | HR 85 | Temp 97.8°F | Ht 69.0 in | Wt 298.2 lb

## 2022-05-22 DIAGNOSIS — R296 Repeated falls: Secondary | ICD-10-CM

## 2022-05-22 DIAGNOSIS — R0602 Shortness of breath: Secondary | ICD-10-CM

## 2022-05-22 DIAGNOSIS — I5042 Chronic combined systolic (congestive) and diastolic (congestive) heart failure: Secondary | ICD-10-CM | POA: Diagnosis not present

## 2022-05-22 DIAGNOSIS — E440 Moderate protein-calorie malnutrition: Secondary | ICD-10-CM | POA: Diagnosis not present

## 2022-05-22 DIAGNOSIS — Z96641 Presence of right artificial hip joint: Secondary | ICD-10-CM

## 2022-05-22 DIAGNOSIS — G8929 Other chronic pain: Secondary | ICD-10-CM

## 2022-05-22 DIAGNOSIS — D696 Thrombocytopenia, unspecified: Secondary | ICD-10-CM

## 2022-05-22 DIAGNOSIS — U071 COVID-19: Secondary | ICD-10-CM

## 2022-05-22 DIAGNOSIS — G4733 Obstructive sleep apnea (adult) (pediatric): Secondary | ICD-10-CM

## 2022-05-22 DIAGNOSIS — R419 Unspecified symptoms and signs involving cognitive functions and awareness: Secondary | ICD-10-CM

## 2022-05-22 DIAGNOSIS — D638 Anemia in other chronic diseases classified elsewhere: Secondary | ICD-10-CM | POA: Diagnosis not present

## 2022-05-22 DIAGNOSIS — K7682 Hepatic encephalopathy: Secondary | ICD-10-CM

## 2022-05-22 DIAGNOSIS — R0609 Other forms of dyspnea: Secondary | ICD-10-CM

## 2022-05-22 DIAGNOSIS — F332 Major depressive disorder, recurrent severe without psychotic features: Secondary | ICD-10-CM

## 2022-05-22 DIAGNOSIS — K7460 Unspecified cirrhosis of liver: Secondary | ICD-10-CM | POA: Diagnosis not present

## 2022-05-22 DIAGNOSIS — I714 Abdominal aortic aneurysm, without rupture, unspecified: Secondary | ICD-10-CM

## 2022-05-22 DIAGNOSIS — F17200 Nicotine dependence, unspecified, uncomplicated: Secondary | ICD-10-CM

## 2022-05-22 DIAGNOSIS — J449 Chronic obstructive pulmonary disease, unspecified: Secondary | ICD-10-CM | POA: Diagnosis not present

## 2022-05-22 DIAGNOSIS — I10 Essential (primary) hypertension: Secondary | ICD-10-CM | POA: Diagnosis not present

## 2022-05-22 DIAGNOSIS — G894 Chronic pain syndrome: Secondary | ICD-10-CM | POA: Diagnosis not present

## 2022-05-22 DIAGNOSIS — R627 Adult failure to thrive: Secondary | ICD-10-CM | POA: Diagnosis not present

## 2022-05-22 NOTE — Patient Instructions (Addendum)
Labs today  Continue current medicines including fentanyl patch 29mg every 3 days.  We will be in touch about lasix dosing - depending on lab results may increase dose given weight gain noted along with leg swelling and shortness of breath.  Consider CPAP use for sleep apnea - you may need to see lung doctor for further evaluation of this.  Return in 1 month for follow up visit.

## 2022-05-22 NOTE — Progress Notes (Unsigned)
Patient ID: [name], male    DOB: Jun 01, 1956, 66 y.o.   MRN: [id number]  This visit was conducted in person.  BP 130/68   Pulse 85   Temp 97.8 F (36.6 C) (Temporal)   Ht 5' 9"  (1.753 m)   Wt 298 lb 3.2 oz (135.3 kg)   SpO2 94%   BMI 44.04 kg/m    CC: hosp f/u visit  Subjective:   HPI: [name] is a 66 y.o. male presenting on 05/22/2022 for Hospitalization Follow-up (Admitted on 05/03/22 at [facility], dx hepatic encephalopathy; AKI.)   Last seen here 01/11/2022. 2 hospitalizations for hepatic encephalopathy in interim, as well as COVID infection. Most recently came home on 05/09/2022, although was recommended SNF. Came home but wife was unable to care for him so he was admitted to [facility] home. Currently at [facility] due to elevated ammonia levels due to lactulose non-compliance as well as COVID infection and polypharmacy with chronic pain meds fentanyl and oxycodone. Fentanyl dose reduced from 20mg to 249m, oxycodone abilify and pristiq were held.   He notes difficulty with shortness of breath "over the past 6 months".  Endorses ongoing pain to lower back - "I need my pain pills".  More confused recently.  Notes increased leg swelling and weight gain "8 months ago". States he's having 1 hard large BM every 2 days.    [facility].   Most recently, hospice involved, but signed off once he was accepted at [facility].   Hospital records reviewed. Med rec performed.  Head CT without acute findings.  AKI on admission was resolved. Discharged on lower doses of aldactone (5039mand lasix (39m27mily).    Home health *** set up.  Other follow up appointments scheduled: *** ______________________________________________________________________ Hospital admission: *** Hospital discharge: *** TCM f/u phone call: *** performed   Discharge Diagnoses:  Principal Problem:   Acute metabolic encephalopathy Active  Problems:   Cirrhosis of liver not due to alcohol (HCC)   Chronic combined systolic and diastolic heart failure (HCC)   COPD (chronic obstructive pulmonary disease) (HCC)   Thrombocytopenia (HCC)   Chronic pain syndrome   Anemia of chronic disease   AKI (acute kidney injury) (HCC)[place]-19 virus infection  Recommendations for Outpatient Follow-up:  Follow up with PCP in 1-2 weeks Please obtain BMP/CBC in one week      Relevant past medical, surgical, family and social history reviewed and updated as indicated. Interim medical history since our last visit reviewed. Allergies and medications reviewed and updated. Outpatient Medications Prior to Visit  Medication Sig Dispense Refill   albuterol (PROVENTIL) (2.5 MG/3ML) 0.083% nebulizer solution USE 1 VIAL PER NEBULIZER EVERY 6 HRS AS NEEDED FOR WHEEZING (Patient taking differently: Take 2.5 mg by nebulization every 6 (six) hours as needed for wheezing or shortness of breath.) 75 mL 6   albuterol (VENTOLIN HFA) 108 (90 Base) MCG/ACT inhaler TAKE 2 PUFFS BY MOUTH EVERY 6 HOURS AS NEEDED FOR WHEEZE OR SHORTNESS OF BREATH (Patient taking differently: Inhale 2 puffs into the lungs every 6 (six) hours as needed for wheezing or shortness of breath.) 6.7 each 00   fentaNYL (DURAGESIC) 25 MCG/HR Place 1 patch onto the skin every 3 (three) days. 5 patch 0   ferrous sulfate 325 (65 FE) MG tablet Take 1 tablet (325 mg total) by mouth every other day.     folic acid (FOLVITE) 1 MG tablet TAKE  1 TABLET BY MOUTH EVERY DAY (Patient taking differently: Take 1 mg by mouth daily.) 90 tablet 3   furosemide (LASIX) 40 MG tablet Take 1 tablet (40 mg total) by mouth daily. 180 tablet 1   gabapentin (NEURONTIN) 300 MG capsule Take 1 capsule (300 mg total) by mouth in the morning and at bedtime.     Lactulose 20 GM/30ML SOLN Take 30 mLs (20 g total) by mouth in the morning, at noon, in the evening, and at bedtime. 3600 mL 2   Misc Natural Products (TART CHERRY  ADVANCED) CAPS Take 1 capsule by mouth 2 (two) times daily.     nicotine (NICODERM CQ - DOSED IN MG/24 HOURS) 14 mg/24hr patch APPLY 1 PATCH ONTO THE SKIN EVERY DAY 28 patch 0   nitroGLYCERIN (NITROSTAT) 0.4 MG SL tablet Place 1 tablet (0.4 mg total) under the tongue every 5 (five) minutes as needed for chest pain. 25 tablet 12   nystatin cream (MYCOSTATIN) APPLY TO AFFECTED AREA TWICE A DAY 30 g 1   omeprazole (PRILOSEC) 40 MG capsule TAKE 1 CAPSULE BY MOUTH EVERY DAY (Patient taking differently: Take 40 mg by mouth daily.) 90 capsule 1   ondansetron (ZOFRAN) 4 MG tablet TAKE 1 TABLET BY MOUTH EVERY 8 HOURS AS NEEDED FOR NAUSEA AND VOMITING 30 tablet 1   spironolactone (ALDACTONE) 100 MG tablet Take 0.5 tablets (50 mg total) by mouth daily. 60 tablet 6   Tiotropium Bromide Monohydrate (SPIRIVA RESPIMAT) 2.5 MCG/ACT AERS Inhale 2 puffs into the lungs daily. 4 g 11   vitamin B-12 (CYANOCOBALAMIN) 500 MCG tablet Take 1 tablet (500 mcg total) by mouth every Monday, Wednesday, and Friday.     No facility-administered medications prior to visit.     Per HPI unless specifically indicated in ROS section below Review of Systems  Objective:  BP 130/68   Pulse 85   Temp 97.8 F (36.6 C) (Temporal)   Ht 5' 9"  (1.753 m)   Wt 298 lb 3.2 oz (135.3 kg)   SpO2 94%   BMI 44.04 kg/m   Wt Readings from Last 3 Encounters:  05/22/22 298 lb 3.2 oz (135.3 kg)  05/09/22 275 lb (124.7 kg)  04/15/22 (!) 303 lb 5.7 oz (137.6 kg)      Physical Exam Vitals and nursing note reviewed.  Constitutional:      Appearance: Normal appearance. He is obese. He is not ill-appearing.     Comments:  Sitting in wheelchair A&O x1 to person.  Thinks today is 10/30 and unsure of year.  Unaware of location ([facility])   Cardiovascular:     Rate and Rhythm: Normal rate and regular rhythm.     Pulses: Normal pulses.     Heart sounds: Normal heart sounds. No murmur heard. Pulmonary:     Effort: Pulmonary effort is  normal. No respiratory distress.     Breath sounds: Normal breath sounds. No wheezing, rhonchi or rales.  Musculoskeletal:        General: Swelling present.     Comments: Nonpitting woody edema bilateral lower extremities  Neurological:     Mental Status: He is alert.  Psychiatric:        Mood and Affect: Mood normal.        Behavior: Behavior normal.       Lab Results  Component Value Date   HGBA1C 5.3 01/31/2022    Lab Results  Component Value Date   CREATININE 1.05 05/06/2022   BUN 20 05/06/2022  NA 133 (L) 05/06/2022   K 4.2 05/06/2022   CL 102 05/06/2022   CO2 25 05/06/2022    Lab Results  Component Value Date   WBC 4.8 05/06/2022   HGB 10.4 (L) 05/06/2022   HCT 31.6 (L) 05/06/2022   MCV 101.0 (H) 05/06/2022   PLT 30 (L) 05/06/2022    Lab Results  Component Value Date   ALT 24 05/05/2022   AST 56 (H) 05/05/2022   ALKPHOS 115 05/05/2022   BILITOT 3.0 (H) 05/05/2022   Assessment & Plan:   Problem List Items Addressed This Visit   None    No orders of the defined types were placed in this encounter.  No orders of the defined types were placed in this encounter.    ***Follow up plan: No follow-ups on file.  Ria Bush, MD

## 2022-05-22 NOTE — Telephone Encounter (Signed)
I was waiting to see patient before responding to patient. Seen today. See today's note.

## 2022-05-22 NOTE — Telephone Encounter (Signed)
Wife called in stating that due to patient diagnosis of sclerosis at the [facility] he be declared incompetent because this illness is affecting his brain? She would like a phone call yo discuss this more [phone].

## 2022-05-22 NOTE — Telephone Encounter (Signed)
Left message for wife [name]. Will try again tomorrow.

## 2022-05-23 ENCOUNTER — Telehealth: Payer: Self-pay | Admitting: Family Medicine

## 2022-05-23 ENCOUNTER — Telehealth: Payer: Self-pay

## 2022-05-23 LAB — CBC WITH DIFFERENTIAL/PLATELET
Basophils Absolute: 0 10*3/uL (ref 0.0–0.1)
Basophils Relative: 0.6 % (ref 0.0–3.0)
Eosinophils Absolute: 0.1 10*3/uL (ref 0.0–0.7)
Eosinophils Relative: 2.6 % (ref 0.0–5.0)
HCT: 31.1 % — ABNORMAL LOW (ref 39.0–52.0)
Hemoglobin: 10.7 g/dL — ABNORMAL LOW (ref 13.0–17.0)
Lymphocytes Relative: 14 % (ref 12.0–46.0)
Lymphs Abs: 0.7 10*3/uL (ref 0.7–4.0)
MCHC: 34.3 g/dL (ref 30.0–36.0)
MCV: 101.9 fl — ABNORMAL HIGH (ref 78.0–100.0)
Monocytes Absolute: 0.4 10*3/uL (ref 0.1–1.0)
Monocytes Relative: 8.4 % (ref 3.0–12.0)
Neutro Abs: 3.9 10*3/uL (ref 1.4–7.7)
Neutrophils Relative %: 74.4 % (ref 43.0–77.0)
Platelets: 41 10*3/uL — CL (ref 150.0–400.0)
RBC: 3.05 Mil/uL — ABNORMAL LOW (ref 4.22–5.81)
RDW: 20.3 % — ABNORMAL HIGH (ref 11.5–15.5)
WBC: 5.3 10*3/uL (ref 4.0–10.5)

## 2022-05-23 LAB — COMPREHENSIVE METABOLIC PANEL
ALT: 19 U/L (ref 0–53)
AST: 26 U/L (ref 0–37)
Albumin: 2.5 g/dL — ABNORMAL LOW (ref 3.5–5.2)
Alkaline Phosphatase: 158 U/L — ABNORMAL HIGH (ref 39–117)
BUN: 17 mg/dL (ref 6–23)
CO2: 32 mEq/L (ref 19–32)
Calcium: 8.2 mg/dL — ABNORMAL LOW (ref 8.4–10.5)
Chloride: 101 mEq/L (ref 96–112)
Creatinine, Ser: 1.1 mg/dL (ref 0.40–1.50)
GFR: 69.6 mL/min (ref >60.00)
Glucose, Bld: 92 mg/dL (ref 70–99)
Potassium: 4 mEq/L (ref 3.5–5.1)
Sodium: 137 mEq/L (ref 135–145)
Total Bilirubin: 1.1 mg/dL (ref 0.2–1.2)
Total Protein: 7.1 g/dL (ref 6.0–8.3)

## 2022-05-23 LAB — BRAIN NATRIURETIC PEPTIDE: Pro B Natriuretic peptide (BNP): 162 pg/mL — ABNORMAL HIGH (ref 0.0–100.0)

## 2022-05-23 NOTE — Telephone Encounter (Signed)
Spoke with wife
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
## 2022-05-23 NOTE — Telephone Encounter (Signed)
Noted. Chronic thrombocytopenia.

## 2022-05-23 NOTE — Telephone Encounter (Signed)
Patient wife called and stated she wanted a call back about patient labs.

## 2022-05-23 NOTE — Telephone Encounter (Signed)
Patient calling and was returning Dr. Darnell Level call.

## 2022-05-23 NOTE — Telephone Encounter (Signed)
[place] Night - Client Nonclinical Telephone Record  [facility] Client [place] Night - Client Client Site [place] Provider Ria Bush - MD Contact Type Call Who Is Calling Patient / Member / Family / Caregiver Caller Name [place] Phone Number [phone] Call Type Message Only Information Provided Reason for Call Returning a Call from the Office Initial [place] states they missed a call from Dr. Danise Mina. Additional Comment Office hours provided. Disp. Time Disposition Final User 05/22/2022 5:27:45 PM General Information Provided Yes Sheets, Ciara Call Closed By: Arminda Resides Transaction Date/Time: 05/22/2022 5:25:48 PM (ET

## 2022-05-23 NOTE — Telephone Encounter (Signed)
Santiago Glad with [facility] lab called critical lab result; platelet 41,000. Sending to Dr Darnell Level and G pool and will let Lattie Haw CMA know. Also logged in lab book.

## 2022-05-25 NOTE — Assessment & Plan Note (Signed)
OVERDUE for [facility] f/u - last seen 02/2021, needed Q6 mo f/u.  Deferred while under hospice however how at SNF will need f/u.

## 2022-05-25 NOTE — Assessment & Plan Note (Signed)
Liver disease and poor nutrition contributes to low albumin.

## 2022-05-25 NOTE — Assessment & Plan Note (Signed)
[place] CSRS reviewed.  According to database, he is now back on higher fentnyl dose of 52mg Q72 hours.  Oxycodone discontinued while hospitalized - remains off this (previously on oxycodone 117m2 tab TID #90 every 15 days - this dose was increased by hospice).  States pain uncontrolled however hesitant to recommend further pain medication at this time due to cirrhosis and mental confusion.

## 2022-05-25 NOTE — Assessment & Plan Note (Addendum)
States he's quit smoking since in SNF.

## 2022-05-25 NOTE — Assessment & Plan Note (Addendum)
25 lb weight gain since latest hospitalization - see below.

## 2022-05-25 NOTE — Assessment & Plan Note (Signed)
Treated 04/2022 while hospitalized.

## 2022-05-25 NOTE — Assessment & Plan Note (Signed)
Update levels.

## 2022-05-25 NOTE — Addendum Note (Signed)
Addended by: Ria Bush on: 05/25/2022 10:46 AM   Modules accepted: Orders

## 2022-05-25 NOTE — Assessment & Plan Note (Addendum)
Liver disease related. Update levels.

## 2022-05-25 NOTE — Assessment & Plan Note (Signed)
Presumed NASH cirrhosis - with stable MELD score at 19.  Known chronic thrombocytopenia and hypoalbuminemia.  H/o/ nonadherence to medications leading to recurrent hospitalizations.  He does endorse regular lactulose use at SNF.

## 2022-05-25 NOTE — Assessment & Plan Note (Signed)
Seems he's currently off controller medication.  COPD previously has not significantly contributed to illness. Lungs without wheezing.

## 2022-05-25 NOTE — Assessment & Plan Note (Addendum)
Recent recurrent hospitalizations with confusion, this contributed. He was not regularly taking lactulose. He is now compliant with all meds through SNF, endorses 1 large soft bowel movement every other day.

## 2022-05-25 NOTE — Assessment & Plan Note (Signed)
Mild on polysomnography 2013. He has previously declined CPAP or further evaluation. This could contribute to shortness of breath, AMS.

## 2022-05-25 NOTE — Assessment & Plan Note (Addendum)
He remains confused, more so than on previous evaluations in the office. A&O x1, which is worse than his previous baseline from last office visit. ?confusion as sequelae of COVID. However he has significant other comorbidities which could contribute including hepatic encephalopathy and polypharmacy.  He is not safe to return home at this time due to significant medical comorbidities and mental confusion. Wife is unable to care for him at home. Will need ongoing social work support.

## 2022-05-25 NOTE — Assessment & Plan Note (Signed)
Both pristiq and abilify were discontinued during latest hospitalization. If antidepressant needed, would recommend restarting Pristiq due to significant liver disease

## 2022-05-25 NOTE — Assessment & Plan Note (Signed)
Continues fentanyl patch and gabapentin 348m BID. He is currently off oxycodone.

## 2022-05-25 NOTE — Assessment & Plan Note (Addendum)
Suspect CHF exacerbation as evidenced by increased weight, pedal edema and shortness of breath in setting of recent diuretic dose drops.  Check BNP, labs, low threshold to increase diuretic.  He does endorsed decreased urination.  Will recommend weighing twice weekly.

## 2022-05-25 NOTE — Assessment & Plan Note (Signed)
Acute on chronic worsening - suspect CHF related.

## 2022-05-25 NOTE — Assessment & Plan Note (Signed)
Not safe to be at home at this time. Appreciate SNF care.

## 2022-05-31 DIAGNOSIS — I5042 Chronic combined systolic (congestive) and diastolic (congestive) heart failure: Secondary | ICD-10-CM | POA: Diagnosis not present

## 2022-05-31 DIAGNOSIS — L03119 Cellulitis of unspecified part of limb: Secondary | ICD-10-CM | POA: Diagnosis not present

## 2022-05-31 DIAGNOSIS — J449 Chronic obstructive pulmonary disease, unspecified: Secondary | ICD-10-CM | POA: Diagnosis not present

## 2022-05-31 DIAGNOSIS — F332 Major depressive disorder, recurrent severe without psychotic features: Secondary | ICD-10-CM | POA: Diagnosis not present

## 2022-05-31 DIAGNOSIS — I10 Essential (primary) hypertension: Secondary | ICD-10-CM | POA: Diagnosis not present

## 2022-05-31 DIAGNOSIS — K7460 Unspecified cirrhosis of liver: Secondary | ICD-10-CM | POA: Diagnosis not present

## 2022-06-02 NOTE — Telephone Encounter (Signed)
Patients wife called in to let Dr Darnell Level know that as of yesterday ,patient was put on palliative care. She said that if Dr Darnell Level needs anything to give her a call.

## 2022-06-05 DIAGNOSIS — K7290 Hepatic failure, unspecified without coma: Secondary | ICD-10-CM | POA: Diagnosis not present

## 2022-06-05 DIAGNOSIS — B372 Candidiasis of skin and nail: Secondary | ICD-10-CM | POA: Diagnosis not present

## 2022-06-05 DIAGNOSIS — I5042 Chronic combined systolic (congestive) and diastolic (congestive) heart failure: Secondary | ICD-10-CM | POA: Diagnosis not present

## 2022-06-05 DIAGNOSIS — K7460 Unspecified cirrhosis of liver: Secondary | ICD-10-CM | POA: Diagnosis not present

## 2022-06-05 DIAGNOSIS — I10 Essential (primary) hypertension: Secondary | ICD-10-CM | POA: Diagnosis not present

## 2022-06-05 DIAGNOSIS — R293 Abnormal posture: Secondary | ICD-10-CM | POA: Diagnosis not present

## 2022-06-05 DIAGNOSIS — R2243 Localized swelling, mass and lump, lower limb, bilateral: Secondary | ICD-10-CM | POA: Diagnosis not present

## 2022-06-05 DIAGNOSIS — K7581 Nonalcoholic steatohepatitis (NASH): Secondary | ICD-10-CM | POA: Diagnosis not present

## 2022-06-05 DIAGNOSIS — L97811 Non-pressure chronic ulcer of other part of right lower leg limited to breakdown of skin: Secondary | ICD-10-CM | POA: Diagnosis not present

## 2022-06-05 DIAGNOSIS — J449 Chronic obstructive pulmonary disease, unspecified: Secondary | ICD-10-CM | POA: Diagnosis not present

## 2022-06-05 DIAGNOSIS — F332 Major depressive disorder, recurrent severe without psychotic features: Secondary | ICD-10-CM | POA: Diagnosis not present

## 2022-06-05 DIAGNOSIS — R2681 Unsteadiness on feet: Secondary | ICD-10-CM | POA: Diagnosis not present

## 2022-06-05 DIAGNOSIS — G9341 Metabolic encephalopathy: Secondary | ICD-10-CM | POA: Diagnosis not present

## 2022-06-05 DIAGNOSIS — R262 Difficulty in walking, not elsewhere classified: Secondary | ICD-10-CM | POA: Diagnosis not present

## 2022-06-05 DIAGNOSIS — M6281 Muscle weakness (generalized): Secondary | ICD-10-CM | POA: Diagnosis not present

## 2022-06-05 DIAGNOSIS — L97821 Non-pressure chronic ulcer of other part of left lower leg limited to breakdown of skin: Secondary | ICD-10-CM | POA: Diagnosis not present

## 2022-06-05 DIAGNOSIS — R918 Other nonspecific abnormal finding of lung field: Secondary | ICD-10-CM | POA: Diagnosis not present

## 2022-06-07 ENCOUNTER — Non-Acute Institutional Stay: Payer: Medicare HMO | Admitting: Family Medicine

## 2022-06-07 ENCOUNTER — Telehealth: Payer: Self-pay | Admitting: Family Medicine

## 2022-06-07 ENCOUNTER — Encounter: Payer: Self-pay | Admitting: Family Medicine

## 2022-06-07 VITALS — BP 118/64 | HR 76 | Temp 98.6°F | Resp 18

## 2022-06-07 DIAGNOSIS — E440 Moderate protein-calorie malnutrition: Secondary | ICD-10-CM

## 2022-06-07 DIAGNOSIS — L03115 Cellulitis of right lower limb: Secondary | ICD-10-CM
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
## 2022-06-07 NOTE — Telephone Encounter (Signed)
Left message for spouse alerting her that this provider with Palliative Care had seen pt today, provided number for call back. Sent scheduled message to Kathe Becton, NP with [facility] caring for patient while in rehab providing notes from today's visit and sent copy to PCP.  Damaris Hippo FNP-c

## 2022-06-08 ENCOUNTER — Telehealth: Payer: Self-pay | Admitting: Family Medicine
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
## 2022-06-15 DIAGNOSIS — Z6841 Body Mass Index (BMI) 40.0 and over, adult: Secondary | ICD-10-CM | POA: Diagnosis not present

## 2022-06-15 DIAGNOSIS — I5042 Chronic combined systolic (congestive) and diastolic (congestive) heart failure: Secondary | ICD-10-CM | POA: Diagnosis not present

## 2022-06-15 DIAGNOSIS — I87312 Chronic venous hypertension (idiopathic) with ulcer of left lower extremity: Secondary | ICD-10-CM | POA: Diagnosis not present

## 2022-06-16 DIAGNOSIS — J449 Chronic obstructive pulmonary disease, unspecified: Secondary | ICD-10-CM | POA: Diagnosis not present

## 2022-06-16 DIAGNOSIS — K7460 Unspecified cirrhosis of liver: Secondary | ICD-10-CM | POA: Diagnosis not present

## 2022-06-16 DIAGNOSIS — K7290 Hepatic failure, unspecified without coma: Secondary | ICD-10-CM | POA: Diagnosis not present

## 2022-06-16 DIAGNOSIS — I5042 Chronic combined systolic (congestive) and diastolic (congestive) heart failure: Secondary | ICD-10-CM | POA: Diagnosis not present

## 2022-06-19 ENCOUNTER — Other Ambulatory Visit: Payer: Self-pay | Admitting: Family Medicine

## 2022-06-20 DIAGNOSIS — I5042 Chronic combined systolic (congestive) and diastolic (congestive) heart failure: Secondary | ICD-10-CM | POA: Diagnosis not present

## 2022-06-20 DIAGNOSIS — J449 Chronic obstructive pulmonary disease, unspecified: Secondary | ICD-10-CM | POA: Diagnosis not present

## 2022-06-20 DIAGNOSIS — B372 Candidiasis of skin and nail: Secondary | ICD-10-CM | POA: Diagnosis not present

## 2022-06-20 DIAGNOSIS — K7460 Unspecified cirrhosis of liver: Secondary | ICD-10-CM | POA: Diagnosis not present

## 2022-06-20 DIAGNOSIS — F332 Major depressive disorder, recurrent severe without psychotic features: Secondary | ICD-10-CM | POA: Diagnosis not present

## 2022-06-20 DIAGNOSIS — I10 Essential (primary) hypertension: Secondary | ICD-10-CM | POA: Diagnosis not present

## 2022-06-22 DIAGNOSIS — I5042 Chronic combined systolic (congestive) and diastolic (congestive) heart failure: Secondary | ICD-10-CM | POA: Diagnosis not present

## 2022-06-22 DIAGNOSIS — I87311 Chronic venous hypertension (idiopathic) with ulcer of right lower extremity: Secondary | ICD-10-CM | POA: Diagnosis not present

## 2022-06-22 DIAGNOSIS — Z6841 Body Mass Index (BMI) 40.0 and over, adult: Secondary | ICD-10-CM | POA: Diagnosis not present

## 2022-06-23 NOTE — Telephone Encounter (Signed)
Refill request Furosemide 40 mg once a day Medication list shows 40 mg  2 tablets daily Last office visit 05/22/22 Please confirm directions

## 2022-06-24 NOTE — Telephone Encounter (Signed)
ERx latest dose '80mg'$  daily.

## 2022-06-28 DIAGNOSIS — L97811 Non-pressure chronic ulcer of other part of right lower leg limited to breakdown of skin: Secondary | ICD-10-CM | POA: Diagnosis not present

## 2022-06-28 DIAGNOSIS — R293 Abnormal posture: Secondary | ICD-10-CM | POA: Diagnosis not present

## 2022-06-28 DIAGNOSIS — L97821 Non-pressure chronic ulcer of other part of left lower leg limited to breakdown of skin: Secondary | ICD-10-CM | POA: Diagnosis not present

## 2022-06-28 DIAGNOSIS — G9341 Metabolic encephalopathy: Secondary | ICD-10-CM | POA: Diagnosis not present

## 2022-06-28 DIAGNOSIS — I70248 Atherosclerosis of native arteries of left leg with ulceration of other part of lower left leg: Secondary | ICD-10-CM | POA: Diagnosis not present

## 2022-06-28 DIAGNOSIS — R262 Difficulty in walking, not elsewhere classified: Secondary | ICD-10-CM | POA: Diagnosis not present

## 2022-06-28 DIAGNOSIS — R2681 Unsteadiness on feet: Secondary | ICD-10-CM | POA: Diagnosis not present

## 2022-06-28 DIAGNOSIS — I5042 Chronic combined systolic (congestive) and diastolic (congestive) heart failure: Secondary | ICD-10-CM | POA: Diagnosis not present

## 2022-06-28 DIAGNOSIS — I70238 Atherosclerosis of native arteries of right leg with ulceration of other part of lower right leg: Secondary | ICD-10-CM | POA: Diagnosis not present

## 2022-06-28 DIAGNOSIS — K7581 Nonalcoholic steatohepatitis (NASH): Secondary | ICD-10-CM | POA: Diagnosis not present

## 2022-06-28 DIAGNOSIS — J449 Chronic obstructive pulmonary disease, unspecified: Secondary | ICD-10-CM | POA: Diagnosis not present

## 2022-06-28 DIAGNOSIS — M6281 Muscle weakness (generalized): Secondary | ICD-10-CM | POA: Diagnosis not present

## 2022-06-29 DIAGNOSIS — K7581 Nonalcoholic steatohepatitis (NASH): Secondary | ICD-10-CM | POA: Diagnosis not present

## 2022-06-29 DIAGNOSIS — R262 Difficulty in walking, not elsewhere classified: Secondary | ICD-10-CM | POA: Diagnosis not present

## 2022-06-29 DIAGNOSIS — I5042 Chronic combined systolic (congestive) and diastolic (congestive) heart failure: Secondary | ICD-10-CM | POA: Diagnosis not present

## 2022-06-29 DIAGNOSIS — M6281 Muscle weakness (generalized): Secondary | ICD-10-CM | POA: Diagnosis not present

## 2022-06-29 DIAGNOSIS — G9341 Metabolic encephalopathy: Secondary | ICD-10-CM | POA: Diagnosis not present

## 2022-06-29 DIAGNOSIS — R293 Abnormal posture: Secondary | ICD-10-CM | POA: Diagnosis not present

## 2022-06-29 DIAGNOSIS — R2681 Unsteadiness on feet: Secondary | ICD-10-CM | POA: Diagnosis not present

## 2022-06-29 DIAGNOSIS — J449 Chronic obstructive pulmonary disease, unspecified: Secondary | ICD-10-CM | POA: Diagnosis not present

## 2022-06-30 DIAGNOSIS — G9341 Metabolic encephalopathy: Secondary | ICD-10-CM | POA: Diagnosis not present

## 2022-06-30 DIAGNOSIS — K7581 Nonalcoholic steatohepatitis (NASH): Secondary | ICD-10-CM | POA: Diagnosis not present

## 2022-06-30 DIAGNOSIS — J449 Chronic obstructive pulmonary disease, unspecified: Secondary | ICD-10-CM | POA: Diagnosis not present

## 2022-06-30 DIAGNOSIS — M6281 Muscle weakness (generalized): Secondary | ICD-10-CM | POA: Diagnosis not present

## 2022-06-30 DIAGNOSIS — I5042 Chronic combined systolic (congestive) and diastolic (congestive) heart failure: Secondary | ICD-10-CM | POA: Diagnosis not present

## 2022-06-30 DIAGNOSIS — R262 Difficulty in walking, not elsewhere classified: Secondary | ICD-10-CM | POA: Diagnosis not present

## 2022-06-30 DIAGNOSIS — R293 Abnormal posture: Secondary | ICD-10-CM | POA: Diagnosis not present

## 2022-06-30 DIAGNOSIS — R2681 Unsteadiness on feet: Secondary | ICD-10-CM | POA: Diagnosis not present

## 2022-07-01 DIAGNOSIS — R293 Abnormal posture: Secondary | ICD-10-CM | POA: Diagnosis not present

## 2022-07-01 DIAGNOSIS — K7581 Nonalcoholic steatohepatitis (NASH): Secondary | ICD-10-CM | POA: Diagnosis not present

## 2022-07-01 DIAGNOSIS — J449 Chronic obstructive pulmonary disease, unspecified: Secondary | ICD-10-CM | POA: Diagnosis not present

## 2022-07-01 DIAGNOSIS — R2681 Unsteadiness on feet: Secondary | ICD-10-CM | POA: Diagnosis not present

## 2022-07-01 DIAGNOSIS — R262 Difficulty in walking, not elsewhere classified: Secondary | ICD-10-CM | POA: Diagnosis not present

## 2022-07-01 DIAGNOSIS — I5042 Chronic combined systolic (congestive) and diastolic (congestive) heart failure: Secondary | ICD-10-CM | POA: Diagnosis not present

## 2022-07-01 DIAGNOSIS — M6281 Muscle weakness (generalized): Secondary | ICD-10-CM | POA: Diagnosis not present

## 2022-07-01 DIAGNOSIS — G9341 Metabolic encephalopathy: Secondary | ICD-10-CM | POA: Diagnosis not present

## 2022-07-02 DIAGNOSIS — I5042 Chronic combined systolic (congestive) and diastolic (congestive) heart failure: Secondary | ICD-10-CM | POA: Diagnosis not present

## 2022-07-02 DIAGNOSIS — G9341 Metabolic encephalopathy: Secondary | ICD-10-CM | POA: Diagnosis not present

## 2022-07-02 DIAGNOSIS — K7581 Nonalcoholic steatohepatitis (NASH): Secondary | ICD-10-CM | POA: Diagnosis not present

## 2022-07-02 DIAGNOSIS — M6281 Muscle weakness (generalized): Secondary | ICD-10-CM | POA: Diagnosis not present

## 2022-07-02 DIAGNOSIS — J449 Chronic obstructive pulmonary disease, unspecified: Secondary | ICD-10-CM | POA: Diagnosis not present

## 2022-07-02 DIAGNOSIS — R293 Abnormal posture: Secondary | ICD-10-CM | POA: Diagnosis not present

## 2022-07-02 DIAGNOSIS — R262 Difficulty in walking, not elsewhere classified: Secondary | ICD-10-CM | POA: Diagnosis not present

## 2022-07-02 DIAGNOSIS — R2681 Unsteadiness on feet: Secondary | ICD-10-CM | POA: Diagnosis not present

## 2022-07-03 DIAGNOSIS — R2681 Unsteadiness on feet: Secondary | ICD-10-CM | POA: Diagnosis not present

## 2022-07-03 DIAGNOSIS — G9341 Metabolic encephalopathy: Secondary | ICD-10-CM | POA: Diagnosis not present

## 2022-07-03 DIAGNOSIS — K7581 Nonalcoholic steatohepatitis (NASH): Secondary | ICD-10-CM | POA: Diagnosis not present

## 2022-07-03 DIAGNOSIS — R293 Abnormal posture: Secondary | ICD-10-CM | POA: Diagnosis not present

## 2022-07-03 DIAGNOSIS — M6281 Muscle weakness (generalized): Secondary | ICD-10-CM | POA: Diagnosis not present

## 2022-07-03 DIAGNOSIS — R262 Difficulty in walking, not elsewhere classified: Secondary | ICD-10-CM | POA: Diagnosis not present

## 2022-07-03 DIAGNOSIS — J449 Chronic obstructive pulmonary disease, unspecified: Secondary | ICD-10-CM | POA: Diagnosis not present

## 2022-07-03 DIAGNOSIS — I5042 Chronic combined systolic (congestive) and diastolic (congestive) heart failure: Secondary | ICD-10-CM | POA: Diagnosis not present

## 2022-07-04 DIAGNOSIS — R262 Difficulty in walking, not elsewhere classified: Secondary | ICD-10-CM | POA: Diagnosis not present

## 2022-07-04 DIAGNOSIS — R293 Abnormal posture: Secondary | ICD-10-CM | POA: Diagnosis not present

## 2022-07-04 DIAGNOSIS — G9341 Metabolic encephalopathy: Secondary | ICD-10-CM | POA: Diagnosis not present

## 2022-07-04 DIAGNOSIS — J449 Chronic obstructive pulmonary disease, unspecified: Secondary | ICD-10-CM | POA: Diagnosis not present

## 2022-07-04 DIAGNOSIS — I5042 Chronic combined systolic (congestive) and diastolic (congestive) heart failure: Secondary | ICD-10-CM | POA: Diagnosis not present

## 2022-07-04 DIAGNOSIS — K7581 Nonalcoholic steatohepatitis (NASH): Secondary | ICD-10-CM | POA: Diagnosis not present

## 2022-07-04 DIAGNOSIS — R2681 Unsteadiness on feet: Secondary | ICD-10-CM | POA: Diagnosis not present

## 2022-07-04 DIAGNOSIS — M6281 Muscle weakness (generalized): Secondary | ICD-10-CM | POA: Diagnosis not present

## 2022-07-06 DIAGNOSIS — I5042 Chronic combined systolic (congestive) and diastolic (congestive) heart failure: Secondary | ICD-10-CM | POA: Diagnosis not present

## 2022-07-06 DIAGNOSIS — I87312 Chronic venous hypertension (idiopathic) with ulcer of left lower extremity: Secondary | ICD-10-CM | POA: Diagnosis not present

## 2022-07-06 DIAGNOSIS — Z6841 Body Mass Index (BMI) 40.0 and over, adult: Secondary | ICD-10-CM | POA: Diagnosis not present

## 2022-07-07 ENCOUNTER — Encounter (HOSPITAL_COMMUNITY): Payer: Self-pay

## 2022-07-07 ENCOUNTER — Emergency Department (HOSPITAL_COMMUNITY): Payer: Medicare HMO

## 2022-07-07 ENCOUNTER — Inpatient Hospital Stay (HOSPITAL_COMMUNITY)
Admission: EM | Admit: 2022-07-07 | Discharge: 2022-07-09 | DRG: 537 | Disposition: A | Discharge status: Other Acute Inpatient Hospital | Payer: Medicare HMO | Source: Skilled Nursing Facility | Attending: Family Medicine | Admitting: Family Medicine

## 2022-07-07 DIAGNOSIS — E119 Type 2 diabetes mellitus without complications: Secondary | ICD-10-CM | POA: Diagnosis present

## 2022-07-07 DIAGNOSIS — J449 Chronic obstructive pulmonary disease, unspecified: Secondary | ICD-10-CM | POA: Diagnosis present

## 2022-07-07 DIAGNOSIS — S73004A Unspecified dislocation of right hip, initial encounter: Secondary | ICD-10-CM | POA: Diagnosis present

## 2022-07-07 DIAGNOSIS — I2510 Atherosclerotic heart disease of native coronary artery without angina pectoris: Secondary | ICD-10-CM | POA: Diagnosis present

## 2022-07-07 DIAGNOSIS — Z888 Allergy status to other drugs, medicaments and biological substances status: Secondary | ICD-10-CM | POA: Diagnosis not present

## 2022-07-07 DIAGNOSIS — K766 Portal hypertension: Secondary | ICD-10-CM | POA: Insufficient documentation

## 2022-07-07 DIAGNOSIS — I252 Old myocardial infarction: Secondary | ICD-10-CM | POA: Diagnosis not present

## 2022-07-07 DIAGNOSIS — I110 Hypertensive heart disease with heart failure: Secondary | ICD-10-CM | POA: Diagnosis present

## 2022-07-07 DIAGNOSIS — Z880 Allergy status to penicillin: Secondary | ICD-10-CM

## 2022-07-07 DIAGNOSIS — K7581 Nonalcoholic steatohepatitis (NASH): Secondary | ICD-10-CM | POA: Diagnosis present

## 2022-07-07 DIAGNOSIS — Z833 Family history of diabetes mellitus: Secondary | ICD-10-CM | POA: Diagnosis not present

## 2022-07-07 DIAGNOSIS — Z841 Family history of disorders of kidney and ureter: Secondary | ICD-10-CM | POA: Diagnosis not present

## 2022-07-07 DIAGNOSIS — R296 Repeated falls: Secondary | ICD-10-CM | POA: Diagnosis present

## 2022-07-07 DIAGNOSIS — R4182 Altered mental status, unspecified: Secondary | ICD-10-CM | POA: Diagnosis not present

## 2022-07-07 DIAGNOSIS — R531 Weakness: Secondary | ICD-10-CM

## 2022-07-07 DIAGNOSIS — Z8249 Family history of ischemic heart disease and other diseases of the circulatory system: Secondary | ICD-10-CM | POA: Diagnosis not present

## 2022-07-07 DIAGNOSIS — R410 Disorientation, unspecified: Secondary | ICD-10-CM

## 2022-07-07 DIAGNOSIS — D61818 Other pancytopenia: Secondary | ICD-10-CM | POA: Diagnosis present

## 2022-07-07 DIAGNOSIS — T8859XA Other complications of anesthesia, initial encounter: Secondary | ICD-10-CM | POA: Diagnosis not present

## 2022-07-07 DIAGNOSIS — Z96649 Presence of unspecified artificial hip joint: Secondary | ICD-10-CM | POA: Diagnosis present

## 2022-07-07 DIAGNOSIS — R609 Edema, unspecified: Secondary | ICD-10-CM | POA: Diagnosis present

## 2022-07-07 DIAGNOSIS — I5042 Chronic combined systolic (congestive) and diastolic (congestive) heart failure: Secondary | ICD-10-CM | POA: Diagnosis present

## 2022-07-07 DIAGNOSIS — G4733 Obstructive sleep apnea (adult) (pediatric): Secondary | ICD-10-CM | POA: Diagnosis present

## 2022-07-07 DIAGNOSIS — E785 Hyperlipidemia, unspecified: Secondary | ICD-10-CM | POA: Diagnosis present

## 2022-07-07 DIAGNOSIS — F332 Major depressive disorder, recurrent severe without psychotic features: Secondary | ICD-10-CM | POA: Diagnosis not present

## 2022-07-07 DIAGNOSIS — G894 Chronic pain syndrome: Secondary | ICD-10-CM | POA: Diagnosis present

## 2022-07-07 DIAGNOSIS — G9340 Encephalopathy, unspecified: Secondary | ICD-10-CM | POA: Diagnosis present

## 2022-07-07 DIAGNOSIS — W010XXA Fall on same level from slipping, tripping and stumbling without subsequent striking against object, initial encounter: Secondary | ICD-10-CM | POA: Diagnosis present

## 2022-07-07 DIAGNOSIS — M109 Gout, unspecified: Secondary | ICD-10-CM | POA: Diagnosis present

## 2022-07-07 DIAGNOSIS — G928 Other toxic encephalopathy: Secondary | ICD-10-CM | POA: Diagnosis not present

## 2022-07-07 DIAGNOSIS — M25551 Pain in right hip: Secondary | ICD-10-CM | POA: Diagnosis not present

## 2022-07-07 DIAGNOSIS — W19XXXA Unspecified fall, initial encounter: Secondary | ICD-10-CM | POA: Diagnosis not present

## 2022-07-07 DIAGNOSIS — T84020A Dislocation of internal right hip prosthesis, initial encounter: Secondary | ICD-10-CM | POA: Diagnosis not present

## 2022-07-07 DIAGNOSIS — I10 Essential (primary) hypertension: Secondary | ICD-10-CM | POA: Diagnosis present

## 2022-07-07 DIAGNOSIS — Z7401 Bed confinement status: Secondary | ICD-10-CM | POA: Diagnosis not present

## 2022-07-07 DIAGNOSIS — Y92009 Unspecified place in unspecified non-institutional (private) residence as the place of occurrence of the external cause: Secondary | ICD-10-CM | POA: Diagnosis not present

## 2022-07-07 DIAGNOSIS — K7460 Unspecified cirrhosis of liver: Secondary | ICD-10-CM | POA: Diagnosis present

## 2022-07-07 DIAGNOSIS — S078XXA Crushing injury of other parts of head, initial encounter: Secondary | ICD-10-CM | POA: Diagnosis not present

## 2022-07-07 DIAGNOSIS — M1611 Unilateral primary osteoarthritis, right hip: Secondary | ICD-10-CM | POA: Diagnosis not present

## 2022-07-07 DIAGNOSIS — T4145XA Adverse effect of unspecified anesthetic, initial encounter: Secondary | ICD-10-CM | POA: Diagnosis not present

## 2022-07-07 DIAGNOSIS — Z79899 Other long term (current) drug therapy: Secondary | ICD-10-CM

## 2022-07-07 DIAGNOSIS — I959 Hypotension, unspecified: Secondary | ICD-10-CM | POA: Diagnosis not present

## 2022-07-07 DIAGNOSIS — K219 Gastro-esophageal reflux disease without esophagitis: Secondary | ICD-10-CM | POA: Diagnosis present

## 2022-07-07 DIAGNOSIS — Z885 Allergy status to narcotic agent status: Secondary | ICD-10-CM

## 2022-07-07 DIAGNOSIS — F17210 Nicotine dependence, cigarettes, uncomplicated: Secondary | ICD-10-CM | POA: Diagnosis present

## 2022-07-07 DIAGNOSIS — Z8261 Family history of arthritis: Secondary | ICD-10-CM

## 2022-07-07 DIAGNOSIS — T50995A Adverse effect of other drugs, medicaments and biological substances, initial encounter: Secondary | ICD-10-CM | POA: Diagnosis present

## 2022-07-07 DIAGNOSIS — M7989 Other specified soft tissue disorders: Secondary | ICD-10-CM | POA: Diagnosis present

## 2022-07-07 DIAGNOSIS — Z96641 Presence of right artificial hip joint: Secondary | ICD-10-CM | POA: Diagnosis not present

## 2022-07-07 LAB — COMPREHENSIVE METABOLIC PANEL
ALT: 29 U/L (ref 0–44)
AST: 52 U/L — ABNORMAL HIGH (ref 15–41)
Albumin: 2.3 g/dL — ABNORMAL LOW (ref 3.5–5.0)
Alkaline Phosphatase: 127 U/L — ABNORMAL HIGH (ref 38–126)
Anion gap: 5 (ref 5–15)
BUN: 19 mg/dL (ref 8–23)
CO2: 25 mmol/L (ref 22–32)
Calcium: 7.8 mg/dL — ABNORMAL LOW (ref 8.9–10.3)
Chloride: 103 mmol/L (ref 98–111)
Creatinine, Ser: 1.08 mg/dL (ref 0.61–1.24)
GFR, Estimated: >60 mL/min (ref >60)
Glucose, Bld: 129 mg/dL — ABNORMAL HIGH (ref 70–99)
Potassium: 4 mmol/L (ref 3.5–5.1)
Sodium: 133 mmol/L — ABNORMAL LOW (ref 135–145)
Total Bilirubin: 2.7 mg/dL — ABNORMAL HIGH (ref 0.3–1.2)
Total Protein: 7.6 g/dL (ref 6.5–8.1)

## 2022-07-07 LAB — CBC WITH DIFFERENTIAL/PLATELET
Abs Immature Granulocytes: 0.01 10*3/uL (ref 0.00–0.07)
Basophils Absolute: 0 10*3/uL (ref 0.0–0.1)
Basophils Relative: 0 %
Eosinophils Absolute: 0 10*3/uL (ref 0.0–0.5)
Eosinophils Relative: 0 %
HCT: 30.4 % — ABNORMAL LOW (ref 39.0–52.0)
Hemoglobin: 9.8 g/dL — ABNORMAL LOW (ref 13.0–17.0)
Immature Granulocytes: 0 %
Lymphocytes Relative: 6 %
Lymphs Abs: 0.4 10*3/uL — ABNORMAL LOW (ref 0.7–4.0)
MCH: 35.1 pg — ABNORMAL HIGH (ref 26.0–34.0)
MCHC: 32.2 g/dL (ref 30.0–36.0)
MCV: 109 fL — ABNORMAL HIGH (ref 80.0–100.0)
Monocytes Absolute: 0.4 10*3/uL (ref 0.1–1.0)
Monocytes Relative: 7 %
Neutro Abs: 5.2 10*3/uL (ref 1.7–7.7)
Neutrophils Relative %: 87 %
Platelets: 48 10*3/uL — ABNORMAL LOW (ref 150–400)
RBC: 2.79 MIL/uL — ABNORMAL LOW (ref 4.22–5.81)
RDW: 17.5 % — ABNORMAL HIGH (ref 11.5–15.5)
WBC: 6 10*3/uL (ref 4.0–10.5)
nRBC: 0 % (ref 0.0–0.2)

## 2022-07-07 LAB — TYPE AND SCREEN
ABO/RH(D): O POS
Antibody Screen: NEGATIVE

## 2022-07-07 LAB — PROTIME-INR
INR: 1.4 — ABNORMAL HIGH (ref 0.8–1.2)
Prothrombin Time: 17.3 seconds — ABNORMAL HIGH (ref 11.4–15.2)

## 2022-07-07 LAB — AMMONIA: Ammonia: 39 umol/L — ABNORMAL HIGH (ref 9–35)

## 2022-07-07 MED ORDER — PROPOFOL 10 MG/ML IV BOLUS
40.0000 mg | Freq: Once | INTRAVENOUS | Status: AC
  Administered 2022-07-07: 40 mg via INTRAVENOUS
  Filled 2022-07-07: qty 20

## 2022-07-07 NOTE — ED Provider Notes (Signed)
  Physical Exam  BP 111/68   Pulse 97   Temp (!) 97.3 F (36.3 C) (Oral)   Resp 13   Wt (!) 143.8 kg   SpO2 100%   BMI 46.81 kg/m   Physical Exam  Procedures  .Sedation  Date/Time: 07/07/2022 11:33 PM  Performed by: Varney Biles, MD Authorized by: Varney Biles, MD   Consent:    Consent obtained:  Verbal   Consent given by:  Spouse   Risks discussed:  Prolonged hypoxia resulting in organ damage, prolonged sedation necessitating reversal, respiratory compromise necessitating ventilatory assistance and intubation, nausea and vomiting   Alternatives discussed:  Analgesia without sedation Universal protocol:    Procedure explained and questions answered to patient or proxy's satisfaction: yes     Immediately prior to procedure, a time out was called: yes     Patient identity confirmed:  Arm band Indications:    Procedure performed:  Dislocation reduction   Procedure necessitating sedation performed by:  Different physician Pre-sedation assessment:    Time since last food or drink:  > 4 HOURS   ASA classification: class 4 - patient with severe systemic disease that is a constant threat to life     Mouth opening:  2 finger widths   Mallampati score:  III - soft palate, base of uvula visible   Neck mobility: reduced     Pre-sedation assessments completed and reviewed: airway patency, cardiovascular function, mental status, nausea/vomiting, pain level, respiratory function and temperature     Pre-sedation assessment completed:  07/07/2022 10:35 PM Immediate pre-procedure details:    Reassessment: Patient reassessed immediately prior to procedure     Reviewed: vital signs, relevant labs/tests and NPO status     Verified: bag valve mask available, emergency equipment available, intubation equipment available, IV patency confirmed and oxygen available   Procedure details (see MAR for exact dosages):    Preoxygenation:  Nasal cannula   Sedation:  Propofol   Intended level of  sedation: deep   Analgesia:  None   Intra-procedure monitoring:  Blood pressure monitoring, continuous capnometry, frequent LOC assessments, cardiac monitor, continuous pulse oximetry and frequent vital sign checks   Intra-procedure events: none     Total Provider sedation time (minutes):  25 Post-procedure details:    Post-sedation assessment completed:  07/07/2022 11:36 PM   Attendance: Constant attendance by certified staff until patient recovered     Recovery: Patient returned to pre-procedure baseline     Post-sedation assessments completed and reviewed: airway patency, cardiovascular function, mental status, nausea/vomiting, pain level and respiratory function     Patient is stable for discharge or admission: yes     Procedure completion:  Tolerated well, no immediate complications   ED Course / MDM    Medical Decision Making Amount and/or Complexity of Data Reviewed Labs: ordered. Radiology: ordered.         Varney Biles, MD 07/07/22 [phone]

## 2022-07-07 NOTE — ED Provider Notes (Signed)
[place] EMERGENCY DEPARTMENT AT [facility] Provider Note   CSN: [id number] Arrival date & time: 07/07/22  2104     History  Chief Complaint  Patient presents with   [name] is a 66 y.o. male.  Patient is a 67 year old male who currently lives at [facility] facility.  He has a history of CHF, obesity, nonalcoholic cirrhosis, AAA, COPD, hypertension, obstructive sleep apnea, chronic leg swelling who presents with right hip pain after a fall.  Per EMS report, patient was standing up from a wheelchair and slipped on something wet on the floor causing him to do a split.  He is complaining of pain in his right hip.  Reportedly he had an x-ray at the facility that showed a deformity in the hip.  He denies any other injuries.  He said he did not hit his head.  He is not on anticoagulants.  He denies any neck or back pain.  He denies any other injuries from the fall.  He does seem to have some confusion present.  He does know where he is but he does not know the date or year/month.  He initially said that he lives at home and then said he lived in the hospital.       Home Medications Prior to Admission medications   Medication Sig Start Date End Date Taking? Authorizing Provider  albuterol (PROVENTIL) (2.5 MG/3ML) 0.083% nebulizer solution USE 1 VIAL PER NEBULIZER EVERY 6 HRS AS NEEDED FOR WHEEZING Patient taking differently: Take 2.5 mg by nebulization every 6 (six) hours as needed for wheezing or shortness of breath. 12/03/20   Ria Bush, MD  albuterol (VENTOLIN HFA) 108 (90 Base) MCG/ACT inhaler TAKE 2 PUFFS BY MOUTH EVERY 6 HOURS AS NEEDED FOR WHEEZE OR SHORTNESS OF BREATH Patient taking differently: Inhale 2 puffs into the lungs every 6 (six) hours as needed for wheezing or shortness of breath. 12/14/21   Ria Bush, MD  fentaNYL (DURAGESIC) 75 MCG/HR Place 1 patch onto the skin every 3 (three) days. 05/25/22   Ria Bush, MD   ferrous sulfate 325 (65 FE) MG tablet Take 1 tablet (325 mg total) by mouth every other day. 04/29/19   Ria Bush, MD  folic acid (FOLVITE) 1 MG tablet TAKE 1 TABLET BY MOUTH EVERY DAY Patient taking differently: Take 1 mg by mouth daily. 11/07/21   Ria Bush, MD  furosemide (LASIX) 40 MG tablet Take 2 tablets (80 mg total) by mouth daily. 06/24/22   Ria Bush, MD  gabapentin (NEURONTIN) 300 MG capsule Take 1 capsule (300 mg total) by mouth in the morning and at bedtime. 06/27/21   Ria Bush, MD  Lactulose 20 GM/30ML SOLN Take 30 mLs (20 g total) by mouth in the morning, at noon, in the evening, and at bedtime. 05/09/22 08/07/22  Elgergawy, Silver Huguenin, MD  Misc Natural Products (TART CHERRY ADVANCED) CAPS Take 1 capsule by mouth 2 (two) times daily.    [provider]  nicotine (NICODERM CQ - DOSED IN MG/24 HOURS) 14 mg/24hr patch APPLY 1 PATCH ONTO THE SKIN EVERY DAY 02/09/22   Ria Bush, MD  nitroGLYCERIN (NITROSTAT) 0.4 MG SL tablet Place 1 tablet (0.4 mg total) under the tongue every 5 (five) minutes as needed for chest pain. 10/10/12   Lelon Perla, MD  nystatin cream (MYCOSTATIN) APPLY TO AFFECTED AREA TWICE A DAY 07/19/21   Billey Co, MD  omeprazole (PRILOSEC) 40 MG capsule TAKE  Continental DAY Patient taking differently: Take 40 mg by mouth daily. 12/14/21   Ria Bush, MD  ondansetron (ZOFRAN) 4 MG tablet TAKE 1 TABLET BY MOUTH EVERY 8 HOURS AS NEEDED FOR NAUSEA AND VOMITING 01/09/22   [name], MD  spironolactone (ALDACTONE) 100 MG tablet Take 0.5 tablets (50 mg total) by mouth daily. 05/09/22   Elgergawy, Silver Huguenin, MD  Tiotropium Bromide Monohydrate (SPIRIVA RESPIMAT) 2.5 MCG/ACT AERS Inhale 2 puffs into the lungs daily. 10/04/21   Ria Bush, MD  vitamin B-12 (CYANOCOBALAMIN) 500 MCG tablet Take 1 tablet (500 mcg total) by mouth every Monday, Wednesday, and Friday. 12/27/18   Ria Bush, MD      Allergies     Statins, Losartan, Wellbutrin [bupropion], Allopurinol, Baclofen, Penicillins, and Tramadol    Review of Systems   Review of Systems  Constitutional:  Negative for fever.  Respiratory:  Negative for shortness of breath.   Cardiovascular:  Positive for leg swelling.  Gastrointestinal:  Negative for nausea and vomiting.  Musculoskeletal:  Positive for arthralgias. Negative for back pain and neck pain.  Skin:  Negative for wound.  Neurological:  Negative for headaches.    Physical Exam Updated Vital Signs BP 111/68   Pulse 97   Temp (!) 97.3 F (36.3 C) (Oral)   Resp 13   Wt (!) 143.8 kg   SpO2 100%   BMI 46.81 kg/m  Physical Exam Constitutional:      Appearance: He is well-developed. He is obese.  HENT:     Head: Normocephalic and atraumatic.  Eyes:     Pupils: Pupils are equal, round, and reactive to light.  Cardiovascular:     Rate and Rhythm: Regular rhythm. Tachycardia present.     Heart sounds: Normal heart sounds.  Pulmonary:     Effort: Pulmonary effort is normal. No respiratory distress.     Breath sounds: Normal breath sounds. No wheezing or rales.  Chest:     Chest wall: No tenderness.  Abdominal:     General: Bowel sounds are normal.     Palpations: Abdomen is soft.     Tenderness: There is no abdominal tenderness. There is no guarding or rebound.  Musculoskeletal:        General: Normal range of motion.     Cervical back: Normal range of motion and neck supple.     Comments: Pain on ROM of right hip.  No pain to knee or ankle.  Bilateral lower leg swelling, unaboots on, feet are warm.  Right pedal pulse present  Lymphadenopathy:     Cervical: No cervical adenopathy.  Skin:    General: Skin is warm and dry.     Findings: No rash.  Neurological:     Mental Status: He is alert.     Comments: Oriented to person and place only, confused to time, moves all extremities symmetrically without obvious focal deficits     ED Results / Procedures / Treatments    Labs (all labs ordered are listed, but only abnormal results are displayed) Labs Reviewed  CBC WITH DIFFERENTIAL/PLATELET - Abnormal; Notable for the following components:      Result Value   RBC 2.79 (*)    Hemoglobin 9.8 (*)    HCT 30.4 (*)    MCV 109.0 (*)    MCH 35.1 (*)    RDW 17.5 (*)    Platelets 48 (*)    Lymphs Abs 0.4 (*)    All other components within  normal limits  PROTIME-INR - Abnormal; Notable for the following components:   Prothrombin Time 17.3 (*)    INR 1.4 (*)    All other components within normal limits  COMPREHENSIVE METABOLIC PANEL - Abnormal; Notable for the following components:   Sodium 133 (*)    Glucose, Bld 129 (*)    Calcium 7.8 (*)    Albumin 2.3 (*)    AST 52 (*)    Alkaline Phosphatase 127 (*)    Total Bilirubin 2.7 (*)    All other components within normal limits  AMMONIA - Abnormal; Notable for the following components:   Ammonia 39 (*)    All other components within normal limits  URINALYSIS, ROUTINE W REFLEX MICROSCOPIC  TYPE AND SCREEN    EKG None  Radiology DG Hip Unilat W or Wo Pelvis 2-3 Views Right  Result Date: 07/07/2022 CLINICAL DATA:  [place], right hip pain EXAM: DG HIP (WITH OR WITHOUT PELVIS) 2-3V RIGHT COMPARISON:  02/05/2019 FINDINGS: Frontal view of the pelvis as well as frontal and frogleg lateral views of the right hip are obtained. There is superior dislocation of the femoral component of the right hip arthroplasty. On the provided images is difficult to tell whether the femoral component is posterior or anterior to the acetabular component. No evidence of acute displaced fracture. Soft tissues are unremarkable. IMPRESSION: 1. Superior dislocation of the femoral component of the right hip arthroplasty. On the provided images, it is difficult to tell whether the femoral component lies anterior or posterior to the acetabular component. 2. No acute displaced fracture. Electronically Signed   By: Randa Ngo M.D.   On:  07/07/2022 22:17    Procedures Reduction of dislocation  Date/Time: 07/07/2022 11:34 PM  Performed by: Malvin Johns, MD Authorized by: Malvin Johns, MD  Consent: Verbal consent obtained. Risks and benefits: risks, benefits and alternatives were discussed Consent given by: Patient's wife via telephone. Patient identity confirmed: verbally with patient  Sedation: Patient sedated: yes Sedatives: propofol Vitals: Vital signs were monitored during sedation.  Patient tolerance: patient tolerated the procedure well with no immediate complications       Medications Ordered in ED Medications  propofol (DIPRIVAN) 10 mg/mL bolus/IV push 40 mg (40 mg Intravenous Given 07/07/22 2254)    ED Course/ Medical Decision Making/ A&P                             Medical Decision Making Amount and/or Complexity of Data Reviewed Labs: ordered. Radiology: ordered.  Risk Decision regarding hospitalization.   Patient is a 67 year old male who presents after a fall.  X-rays were obtained which were interpreted by me to show a right hip dislocation.  Informed consent was obtained verbally from the patient's wife, Jorma Tassinari.  Using procedural sedation, patient's hip was successfully reduced.  Follow-up x-rays show reduction of the right hip.  This was interpreted by me and confirmed by the radiologist.  Patient tolerated the procedure well without any unexpected events.  He does appear confused.  He is only oriented to person and place.  I spoke with the patient's wife who says that he has some mild baseline confusion but that this is abnormal for him.  She is concerned that his ammonia level may be elevated.  Labs were obtained.  His ammonia level is actually normal.  He is afebrile.  His hemoglobin is 9.8 which is just slightly lower than his baseline values.  His  other labs are similar to prior values.  He is awaiting a urinalysis and a head CT.  However given his worsening confusion, I feel  that he will need to be admitted for further evaluation.   CT looks ok, awaiting u/a.  Spoke with Dr. Marlowe Sax who will admit the patient.   Final Clinical Impression(s) / ED Diagnoses Final diagnoses:  Fall, initial encounter  Hip dislocation, right, initial encounter [facility])  Confusion    Rx / DC Orders ED Discharge Orders     None         Malvin Johns, MD 07/07/22 2346    Malvin Johns, MD 07/08/22 0028

## 2022-07-07 NOTE — ED Notes (Signed)
Pt is awake/ drowsy but able to answer questions appropriately

## 2022-07-07 NOTE — ED Triage Notes (Signed)
BIB by [facility]/ approx. 230pm today, pt stood up from wheelchair and slipped on something wet in floor, causing pt to do a "split" and injured R hip/ Xray @ facility showed deformity.

## 2022-07-07 NOTE — ED Notes (Signed)
Sedation END

## 2022-07-08 ENCOUNTER — Other Ambulatory Visit: Payer: Self-pay

## 2022-07-08 DIAGNOSIS — G9340 Encephalopathy, unspecified: Secondary | ICD-10-CM

## 2022-07-08 DIAGNOSIS — W19XXXA Unspecified fall, initial encounter: Secondary | ICD-10-CM | POA: Diagnosis present

## 2022-07-08 DIAGNOSIS — Z841 Family history of disorders of kidney and ureter: Secondary | ICD-10-CM | POA: Diagnosis not present

## 2022-07-08 DIAGNOSIS — D61818 Other pancytopenia: Secondary | ICD-10-CM | POA: Diagnosis present

## 2022-07-08 DIAGNOSIS — Z96649 Presence of unspecified artificial hip joint: Secondary | ICD-10-CM | POA: Diagnosis present

## 2022-07-08 DIAGNOSIS — Z885 Allergy status to narcotic agent status: Secondary | ICD-10-CM | POA: Diagnosis not present

## 2022-07-08 DIAGNOSIS — K766 Portal hypertension: Secondary | ICD-10-CM | POA: Diagnosis present

## 2022-07-08 DIAGNOSIS — Y92009 Unspecified place in unspecified non-institutional (private) residence as the place of occurrence of the external cause: Secondary | ICD-10-CM | POA: Diagnosis not present

## 2022-07-08 DIAGNOSIS — R410 Disorientation, unspecified: Secondary | ICD-10-CM | POA: Diagnosis present

## 2022-07-08 DIAGNOSIS — I2510 Atherosclerotic heart disease of native coronary artery without angina pectoris: Secondary | ICD-10-CM | POA: Diagnosis present

## 2022-07-08 DIAGNOSIS — I252 Old myocardial infarction: Secondary | ICD-10-CM | POA: Diagnosis not present

## 2022-07-08 DIAGNOSIS — I5042 Chronic combined systolic (congestive) and diastolic (congestive) heart failure: Secondary | ICD-10-CM | POA: Diagnosis present

## 2022-07-08 DIAGNOSIS — Z888 Allergy status to other drugs, medicaments and biological substances status: Secondary | ICD-10-CM | POA: Diagnosis not present

## 2022-07-08 DIAGNOSIS — G4733 Obstructive sleep apnea (adult) (pediatric): Secondary | ICD-10-CM | POA: Diagnosis present

## 2022-07-08 DIAGNOSIS — S73004A Unspecified dislocation of right hip, initial encounter: Secondary | ICD-10-CM | POA: Diagnosis present

## 2022-07-08 DIAGNOSIS — I110 Hypertensive heart disease with heart failure: Secondary | ICD-10-CM | POA: Diagnosis present

## 2022-07-08 DIAGNOSIS — Z8261 Family history of arthritis: Secondary | ICD-10-CM | POA: Diagnosis not present

## 2022-07-08 DIAGNOSIS — K7581 Nonalcoholic steatohepatitis (NASH): Secondary | ICD-10-CM | POA: Diagnosis present

## 2022-07-08 DIAGNOSIS — F17210 Nicotine dependence, cigarettes, uncomplicated: Secondary | ICD-10-CM | POA: Diagnosis present

## 2022-07-08 DIAGNOSIS — J449 Chronic obstructive pulmonary disease, unspecified: Secondary | ICD-10-CM | POA: Diagnosis present

## 2022-07-08 DIAGNOSIS — Z8249 Family history of ischemic heart disease and other diseases of the circulatory system: Secondary | ICD-10-CM | POA: Diagnosis not present

## 2022-07-08 DIAGNOSIS — Z833 Family history of diabetes mellitus: Secondary | ICD-10-CM | POA: Diagnosis not present

## 2022-07-08 DIAGNOSIS — W010XXA Fall on same level from slipping, tripping and stumbling without subsequent striking against object, initial encounter: Secondary | ICD-10-CM | POA: Diagnosis present

## 2022-07-08 DIAGNOSIS — G928 Other toxic encephalopathy: Secondary | ICD-10-CM | POA: Diagnosis not present

## 2022-07-08 DIAGNOSIS — E785 Hyperlipidemia, unspecified: Secondary | ICD-10-CM | POA: Diagnosis present

## 2022-07-08 DIAGNOSIS — K7460 Unspecified cirrhosis of liver: Secondary | ICD-10-CM | POA: Diagnosis present

## 2022-07-08 DIAGNOSIS — Z880 Allergy status to penicillin: Secondary | ICD-10-CM | POA: Diagnosis not present

## 2022-07-08 DIAGNOSIS — R296 Repeated falls: Secondary | ICD-10-CM | POA: Diagnosis present

## 2022-07-08 DIAGNOSIS — E119 Type 2 diabetes mellitus without complications: Secondary | ICD-10-CM | POA: Diagnosis present

## 2022-07-08 DIAGNOSIS — G894 Chronic pain syndrome: Secondary | ICD-10-CM | POA: Diagnosis present

## 2022-07-08 LAB — COMPREHENSIVE METABOLIC PANEL
ALT: 26 U/L (ref 0–44)
AST: 47 U/L — ABNORMAL HIGH (ref 15–41)
Albumin: 1.9 g/dL — ABNORMAL LOW (ref 3.5–5.0)
Alkaline Phosphatase: 96 U/L (ref 38–126)
Anion gap: 6 (ref 5–15)
BUN: 18 mg/dL (ref 8–23)
CO2: 27 mmol/L (ref 22–32)
Calcium: 7.9 mg/dL — ABNORMAL LOW (ref 8.9–10.3)
Chloride: 103 mmol/L (ref 98–111)
Creatinine, Ser: 0.86 mg/dL (ref 0.61–1.24)
GFR, Estimated: >60 mL/min (ref >60)
Glucose, Bld: 100 mg/dL — ABNORMAL HIGH (ref 70–99)
Potassium: 3.9 mmol/L (ref 3.5–5.1)
Sodium: 136 mmol/L (ref 135–145)
Total Bilirubin: 3.5 mg/dL — ABNORMAL HIGH (ref 0.3–1.2)
Total Protein: 6.7 g/dL (ref 6.5–8.1)

## 2022-07-08 LAB — URINALYSIS, ROUTINE W REFLEX MICROSCOPIC
Bacteria, UA: NONE SEEN
Bilirubin Urine: NEGATIVE
Glucose, UA: NEGATIVE mg/dL
Ketones, ur: NEGATIVE mg/dL
Leukocytes,Ua: NEGATIVE
Nitrite: NEGATIVE
Protein, ur: NEGATIVE mg/dL
Specific Gravity, Urine: 1.015 (ref 1.005–1.030)
pH: 6 (ref 5.0–8.0)

## 2022-07-08 LAB — CBC WITH DIFFERENTIAL/PLATELET
Abs Immature Granulocytes: 0.01 10*3/uL (ref 0.00–0.07)
Basophils Absolute: 0 10*3/uL (ref 0.0–0.1)
Basophils Relative: 0 %
Eosinophils Absolute: 0.1 10*3/uL (ref 0.0–0.5)
Eosinophils Relative: 2 %
HCT: 30.2 % — ABNORMAL LOW (ref 39.0–52.0)
Hemoglobin: 9.5 g/dL — ABNORMAL LOW (ref 13.0–17.0)
Immature Granulocytes: 0 %
Lymphocytes Relative: 10 %
Lymphs Abs: 0.5 10*3/uL — ABNORMAL LOW (ref 0.7–4.0)
MCH: 34.8 pg — ABNORMAL HIGH (ref 26.0–34.0)
MCHC: 31.5 g/dL (ref 30.0–36.0)
MCV: 110.6 fL — ABNORMAL HIGH (ref 80.0–100.0)
Monocytes Absolute: 0.4 10*3/uL (ref 0.1–1.0)
Monocytes Relative: 9 %
Neutro Abs: 3.7 10*3/uL (ref 1.7–7.7)
Neutrophils Relative %: 79 %
Platelets: 44 10*3/uL — ABNORMAL LOW (ref 150–400)
RBC: 2.73 MIL/uL — ABNORMAL LOW (ref 4.22–5.81)
RDW: 17.7 % — ABNORMAL HIGH (ref 11.5–15.5)
WBC: 4.7 10*3/uL (ref 4.0–10.5)
nRBC: 0 % (ref 0.0–0.2)

## 2022-07-08 LAB — HIV ANTIBODY (ROUTINE TESTING W REFLEX): HIV Screen 4th Generation wRfx: NONREACTIVE

## 2022-07-08 MED ORDER — ONDANSETRON HCL 4 MG PO TABS: 4.0000 mg | ORAL_TABLET | Freq: Three times a day (TID) | ORAL | Status: DC | PRN

## 2022-07-08 MED ORDER — PANTOPRAZOLE SODIUM 40 MG PO TBEC
80.0000 mg | DELAYED_RELEASE_TABLET | Freq: Every day | ORAL | Status: DC
  Administered 2022-07-08 – 2022-07-09 (×2): 80 mg via ORAL
  Filled 2022-07-08 (×2): qty 2

## 2022-07-08 MED ORDER — FERROUS SULFATE 325 (65 FE) MG PO TABS
325.0000 mg | ORAL_TABLET | ORAL | Status: DC
  Administered 2022-07-09: 325 mg via ORAL
  Filled 2022-07-08: qty 1

## 2022-07-08 MED ORDER — RIFAXIMIN 550 MG PO TABS
550.0000 mg | ORAL_TABLET | Freq: Two times a day (BID) | ORAL | Status: DC
  Administered 2022-07-08 – 2022-07-09 (×3): 550 mg via ORAL
  Filled 2022-07-08 (×3): qty 1

## 2022-07-08 MED ORDER — SPIRONOLACTONE 25 MG PO TABS
50.0000 mg | ORAL_TABLET | Freq: Every day | ORAL | Status: DC
  Administered 2022-07-08 – 2022-07-09 (×2): 50 mg via ORAL
  Filled 2022-07-08 (×2): qty 2

## 2022-07-08 MED ORDER — FENTANYL CITRATE PF 50 MCG/ML IJ SOSY
50.0000 ug | PREFILLED_SYRINGE | INTRAMUSCULAR | Status: DC | PRN
  Administered 2022-07-08: 50 ug via INTRAVENOUS
  Filled 2022-07-08: qty 1

## 2022-07-08 MED ORDER — NICOTINE 14 MG/24HR TD PT24
14.0000 mg | MEDICATED_PATCH | Freq: Every day | TRANSDERMAL | Status: DC
  Administered 2022-07-08 – 2022-07-09 (×2): 14 mg via TRANSDERMAL
  Filled 2022-07-08 (×2): qty 1

## 2022-07-08 MED ORDER — OXYCODONE HCL 5 MG PO TABS
5.0000 mg | ORAL_TABLET | Freq: Three times a day (TID) | ORAL | Status: DC | PRN
  Administered 2022-07-08 – 2022-07-09 (×2): 5 mg via ORAL
  Filled 2022-07-08 (×2): qty 1

## 2022-07-08 MED ORDER — ALBUTEROL SULFATE (2.5 MG/3ML) 0.083% IN NEBU: 2.5000 mg | INHALATION_SOLUTION | Freq: Four times a day (QID) | RESPIRATORY_TRACT | Status: DC | PRN

## 2022-07-08 MED ORDER — TIOTROPIUM BROMIDE MONOHYDRATE 2.5 MCG/ACT IN AERS: 2.0000 | INHALATION_SPRAY | Freq: Every day | RESPIRATORY_TRACT | Status: DC

## 2022-07-08 MED ORDER — UMECLIDINIUM BROMIDE 62.5 MCG/ACT IN AEPB
1.0000 | INHALATION_SPRAY | Freq: Every day | RESPIRATORY_TRACT | Status: DC
  Administered 2022-07-08 – 2022-07-09 (×2): 1 via RESPIRATORY_TRACT
  Filled 2022-07-08: qty 7

## 2022-07-08 MED ORDER — FUROSEMIDE 40 MG PO TABS
80.0000 mg | ORAL_TABLET | Freq: Every day | ORAL | Status: DC
  Administered 2022-07-09: 80 mg via ORAL
  Filled 2022-07-08: qty 2

## 2022-07-08 MED ORDER — FOLIC ACID 1 MG PO TABS
1.0000 mg | ORAL_TABLET | Freq: Every day | ORAL | Status: DC
  Administered 2022-07-08 – 2022-07-09 (×2): 1 mg via ORAL
  Filled 2022-07-08 (×2): qty 1

## 2022-07-08 MED ORDER — GABAPENTIN 300 MG PO CAPS
300.0000 mg | ORAL_CAPSULE | Freq: Three times a day (TID) | ORAL | Status: DC
  Administered 2022-07-08 – 2022-07-09 (×4): 300 mg via ORAL
  Filled 2022-07-08 (×4): qty 1

## 2022-07-08 MED ORDER — FENTANYL 75 MCG/HR TD PT72
1.0000 | MEDICATED_PATCH | TRANSDERMAL | Status: DC
  Administered 2022-07-09: 1 via TRANSDERMAL
  Filled 2022-07-08: qty 1

## 2022-07-08 MED ORDER — LACTULOSE 10 GM/15ML PO SOLN
20.0000 g | Freq: Three times a day (TID) | ORAL | Status: DC
  Administered 2022-07-08 – 2022-07-09 (×3): 20 g via ORAL
  Filled 2022-07-08 (×4): qty 30

## 2022-07-08 NOTE — Progress Notes (Signed)
Request for prevalon boots from materials.

## 2022-07-08 NOTE — TOC Initial Note (Addendum)
Transition of Care [facility]) - Initial/Assessment Note    Patient Details  Name: [name] MRN: [id number] Date of Birth: 1956-03-23  [facility] CM/SW Contact:    Vassie Moselle, LCSW Phone Number: 07/08/2022, 2:43 PM  Clinical Narrative:                 Met with pt who confirms he currently resides at [facility] SN[facility]. Pt shares he has been there around 3 weeks and he states he is there for ST rehab.  CSW contact [facility] who state that pt is a LTC resident at their facility and he is able to return at discharge.  Per chart review pt was active with [facility] hospice in past. Per Janett Billow w/ [facility] pt is no longer active with their services.   Expected Discharge Plan: Long Term Nursing Home Barriers to Discharge: Continued Medical Work up   Patient Goals and CMS Choice Patient states their goals for this hospitalization and ongoing recovery are:: To return to [facility] CMS Medicare.gov Compare Post Acute Care list provided to:: Patient Choice offered to / list presented to : Patient      Expected Discharge Plan and Services In-house Referral: NA Discharge Planning Services: NA Post Acute Care Choice: Nursing Home, Resumption of Svcs/PTA Provider Living arrangements for the past 2 months: [place], [place]                 DME Arranged: N/A DME Agency: NA                  Prior Living Arrangements/Services Living arrangements for the past 2 months: [place], [place] Lives with:: Facility Resident Patient language and need for interpreter reviewed:: Yes Do you feel safe going back to the place where you live?: Yes      Need for Family Participation in Patient Care: No (Comment) Care giver support system in place?: Yes (comment) Current home services: Hospice Criminal Activity/Legal Involvement Pertinent to Current Situation/Hospitalization: No - Comment as needed  Activities of Daily  Living Home Assistive Devices/Equipment: Gilford Rile (specify type) ADL Screening (condition at time of admission) Patient's cognitive ability adequate to safely complete daily activities?: Yes Is the patient deaf or have difficulty hearing?: No Does the patient have difficulty seeing, even when wearing glasses/contacts?: No Does the patient have difficulty concentrating, remembering, or making decisions?: No Patient able to express need for assistance with ADLs?: Yes Does the patient have difficulty dressing or bathing?: No Independently performs ADLs?: Yes (appropriate for developmental age) Does the patient have difficulty walking or climbing stairs?: Yes Weakness of Legs: None Weakness of Arms/Hands: None  Permission Sought/Granted Permission sought to share information with : Facility Art therapist granted to share information with : Yes, Verbal Permission Granted     Permission granted to share info w AGENCY: [facility]        Emotional Assessment Appearance:: Appears stated age Attitude/Demeanor/Rapport: Engaged Affect (typically observed): Accepting Orientation: : Oriented to Self, Oriented to Place, Oriented to  Time, Oriented to Situation Alcohol / Substance Use: Not Applicable Psych Involvement: No (comment)  Admission diagnosis:  Confusion [R41.0] Encephalopathy [G93.40] Hip dislocation, right, initial encounter ([place]) [S73.004A] Fall, initial encounter [W19.XXXA] Patient Active Problem List   Diagnosis Date Noted   Encephalopathy 07/08/2022   Portal hypertension ([place]) 07/08/2022   Closed dislocation of right hip ([place]) 07/08/2022   Bilateral cellulitis of lower leg 06/07/2022   COVID-19 virus infection 05/04/2022  Hepatic encephalopathy ([place]) 04/15/2022   Hospice care patient 02/01/2022   Cellulitis of perineum    Scrotal swelling    Scrotal pain    Edema    Chronic combined systolic and diastolic heart failure ([place]) 01/31/2022   Cellulitis,  scrotum 01/31/2022   Venous stasis ulcer of left lower leg with edema of left lower leg (HCC) 01/06/2022   Lymphadenopathy of head and neck region 12/15/2021   FTT (failure to thrive) in adult 11/16/2021   Driving safety issue 10/06/2021   Decreased visual acuity 10/06/2021   Cognitive safety issue 06/27/2021   Status post right hip replacement 12/04/2020   Carotid stenosis, asymptomatic, bilateral 08/28/2020   Statin intolerance 08/05/2020   Mass of both parotid glands 08/05/2020   Memory deficit 01/10/2020   Fall at home, initial encounter 02/04/2019   Pancytopenia ([place]) 09/18/2018   Hearing loss 08/07/2018   Abdominal aortic aneurysm (AAA) without rupture ([place]) 01/02/2018   History of MRSA infection 01/02/2018   History of myocardial infarction 01/02/2018   Aortic atherosclerosis ([place]) 01/01/2018   MDD (major depressive disorder), recurrent severe, without psychosis ([place]) 11/17/2017   Candidal intertrigo 05/08/2017   Falls frequently 03/13/2017   Abnormal drug screen 07/09/2016   Thrombocytopenia ([place]) 04/29/2016   Anemia of chronic disease 04/29/2016   Protein-calorie malnutrition ([place]) 04/29/2016   RUQ abdominal pain 04/26/2016   Encounter for chronic pain management 01/04/2016   Preop cardiovascular exam 09/01/2014   Generalized weakness 07/22/2014   Esophageal dysphagia 07/22/2014   Gross hematuria 05/19/2014   Encounter for general adult medical examination with abnormal findings 04/21/2014   Advanced care planning/counseling discussion 04/21/2014   Orthostatic hypotension 03/13/2014   Cirrhosis of liver not due to alcohol ([place]) 01/03/2014   Osteoarthritis of right hip 08/16/2012   Medicare annual wellness visit, subsequent 06/07/2012   Chronic dyspnea 10/06/2011   DDD (degenerative disc disease), cervical    Vitamin D deficiency 03/14/2011   Vitamin B12 deficiency 08/05/2010   Prediabetes 06/27/2010   OSA (obstructive sleep apnea) 05/16/2010   Cervical spondylosis  05/05/2010   Obesity, morbid, BMI 40.0-49.9 ([place]) 05/03/2010   Hidradenitis 05/03/2010   Smoker 10/21/2009   HLD (hyperlipidemia) 09/07/2009   Gout 09/07/2009   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Phone: [phone]: [phone] [place] [address]Phone: [phone]: [phone]    Social Determinants of Health (SDOH) Social History: [place] No Food Insecurity (04/15/2022)  Housing: Low Risk  (07/08/2022)  Transportation Needs: No Transportation Needs (04/15/2022)  Utilities: Not At Risk (04/15/2022)  Depression (PHQ2-9): High Risk (10/04/2021)  Financial Resource Strain: High Risk (12/27/2020)  Physical Activity: Unknown (02/20/2018)  Social Connections: Unknown (02/20/2018)  Tobacco Use: High Risk (07/07/2022)   SDOH Interventions:     Readmission Risk Interventions     No data to  display

## 2022-07-08 NOTE — ED Notes (Signed)
Pt able to sign consent for Right hip reduction

## 2022-07-08 NOTE — Plan of Care (Signed)
  Problem: Education: Goal: Knowledge of General Education information will improve Description Including pain rating scale, medication(s)/side effects and non-pharmacologic comfort measures Outcome: Progressing   Problem: Nutrition: Goal: Adequate nutrition will be maintained Outcome: Progressing   Problem: Pain Managment: Goal: General experience of comfort will improve Outcome: Progressing   

## 2022-07-08 NOTE — Plan of Care (Signed)
  Problem: Nutrition: Goal: Adequate nutrition will be maintained Outcome: Progressing   

## 2022-07-08 NOTE — Consult Note (Signed)
WOC Nurse Consult Note: Reason for Consult:Bilateral Unna's Boots upon admission for venous insufficiency and lymphedema. Patient resides in SNF/Rehab ([facility]) Wound type:Venous insufficiency and lymphedema Pressure Injury POA: N/A Measurement: 3cm round intact serum filled blister on left proximal LE Wound bed:N/A Drainage (amount, consistency, odor) Bilateral LEs with weeping dermatitis, R>L Periwound: hemosiderin staining Dressing procedure/placement/frequency: I have provided Nursing with guidance for the topical care of the LEs using a NS cleanse prior to [employer] application by Sheliah Hatch on Sundays and Wednesdays, beginning tomorrow. IN addition, the left proximal pretibial area with an intact, serum-filled blister will have topical care provided twice weekly using a xeroform gauze (astringent, antimicrobial, nonadherent) cover, a dry gauze topper and securement with a silicone foam dressing. After dressing the LE, the Bedside RN will contact Ortho Tech for the placement of bilateral Unna's Boots. Following boot placement, the feet will be placed into pressure redistribution heel boots (Prevalon) for PI prevention. A sacral silicone foam is to be placed for pI prevention. Turning and repositioning is in place.  A bariatric bed with low air loss feature is provided for management of microclimate and pressure redistribution.  [place] nursing team will not follow, but will remain available to this patient, the nursing and medical teams.  Please re-consult if needed.  Thank you for inviting [place] to participate in this patient's Plan of Care.  Maudie Flakes, MSN, RN, CNS, [place], Serita Grammes, [employer], [employer] phone:  [phone]

## 2022-07-08 NOTE — ED Notes (Signed)
ED TO INPATIENT HANDOFF REPORT  ED Nurse Name and Phone #: Billy [phone]  S Name/Age/Gender [name] 67 y.o. male Room/Bed: WA17/WA17  Code Status   Code Status: Prior  Home/SNF/Other Nursing Home Patient oriented to: self and place Is this baseline?  Yes  Triage Complete: Triage complete  Chief Complaint Encephalopathy [G93.40]  Triage Note BIB by [facility]/ approx. 230pm today, pt stood up from wheelchair and slipped on something wet in floor, causing pt to do a "split" and injured R hip/ Xray @ facility showed deformity.    Allergies Allergies  Allergen Reactions   Statins Shortness Of Breath    Cough, trouble breathing Cough, trouble breathing   Losartan Other (See Comments)    Causes him to have pain   Wellbutrin [Bupropion] Other (See Comments)    Worsened mood - crying   Allopurinol Nausea Only   Baclofen Nausea And Vomiting   Penicillins Nausea And Vomiting    Did it involve swelling of the face/tongue/throat, SOB, or low BP? N/A Did it involve sudden or severe rash/hives, skin peeling, or any reaction on the inside of your mouth or nose? N/A Did you need to seek medical attention at a hospital or doctor's office? N/A When did it last happen? Child     If all above answers are "NO", may proceed with cephalosporin use.   Tramadol Nausea Only    Level of Care/Admitting Diagnosis ED Disposition     ED Disposition  Admit   Condition  --   Comment  Hospital Area: [place] [[id number]]  Level of Care: Med-Surg [16]  May place patient in observation at [facility] or [place] if equivalent level of care is available:: Yes  Covid Evaluation: Asymptomatic - no recent exposure (last 10 days) testing not required  Diagnosis: Encephalopathy [[id number]]  Admitting Physician: Jackelyn Knife [[id number]]  Attending Physician: Jackelyn Knife [[id number]]          B Medical/Surgery History Past Medical History:  Diagnosis Date    AAA (abdominal aortic aneurysm) ([place]) 09/2012--  monitored by dr Trula Slade   stable 5.6cm CTA abdomen 2016   Abnormal drug screen 07/09/2016   1/2/018 - positive oxycodone, fentanyl, inapprop positive MJ - mod risk   Allergic rhinitis    Ascites 03/2019   B12 deficiency    CAD (coronary artery disease) cardiologist-  dr Stanford Breed   x3 with stents last 2005, EF 40%, predominantly RCA by CT 2016   Cataracts, bilateral    Cervical spondylosis 05/2010   s/p surgery   Charcot Lelan Pons Tooth muscular atrophy dx  1975   neurologist--  dr love--  type 2 per pt   Chronic pain syndrome    established with [facility] --> disagreement and transfered care to Dr Sanjuan Dame at [facility] pain clinic [facility], requests PCP write Rx but f/u with pain clinic Q6-12 months   COPD (chronic obstructive pulmonary disease) ([place]) 10/2011   minimal by PFTs   DDD (degenerative disc disease)    Disturbances of sensation of smell and taste    improving   Dyspnea on exertion    GERD (gastroesophageal reflux disease)    Gout    Headache    Hepatitis    hepatitis B   Hidradenitis    right groin   Hidradenitis suppurativa dx 2011   goin and leg crease   followd by Lyndle Herrlich - daily bactrim, s/p intralesional steroid injection 10/2010   Hip osteoarthritis  s/p intraarticular steroid shot (12/2012) ([place])   History of hepatitis B 1983   History of MI (myocardial infarction)    2000  &  2005   History of pneumonia    History of viral meningitis 2000   HLD (hyperlipidemia)    HTN (hypertension)    Ischemic cardiomyopathy    s/p inferior MI  --  current ef per myoview 39%   Liver cirrhosis secondary to NASH ([place]) 01/2014   by CT scan, rec virtual colonoscopy by Dr Collene Mares 06/2014   Lumbar herniated disc    Myocardial infarction ([place])    x2   Nocturia more than twice per night    Obesity    Spinal stenosis    released from [place].  established with preferred [facility] (07/2013)   T2DM (type 2  diabetes mellitus) ([place])    ABIs WNL 2016   Vitamin D deficiency    Past Surgical History:  Procedure Laterality Date   ABDOMINAL AORTIC ENDOVASCULAR FENESTRATED STENT GRAFT N/A 11/30/2015   Procedure: ABDOMINAL AORTIC ENDOVASCULAR FENESTRATED STENT GRAFT;  Surgeon: Serafina Mitchell, MD;  Location: [facility];  Service: Vascular;  Laterality: N/A;   ANTERIOR CERVICAL DECOMP/DISCECTOMY FUSION  01-07-2010    C4 -- C7   CARDIAC CATHETERIZATION  03-30-2005  dr Albertine Patricia   ef 40% w/ inferior akinesis/  LM and CFX angiographically normal/  pLAD 30%/   Widely patent stents in RCA and PDA widely patent   CARDIOVASCULAR STRESS TEST  10-23-2012  dr Stanford Breed   No ischemia/  Moderate scar in the inferior wall, otherwise normal perfusion/  LV ef 39%,  LV wall motion: inferior/ inferolateral hypokinesis   COLONOSCOPY  05/06/2007   normal, small int hemorrhoids rpt 5 yrs due to fmhx - rec against rpt colonoscopy by Dr Collene Mares   CORONARY ANGIOPLASTY  2000  dr Stanford Breed   PCI to RCA and PDA   CORONARY ANGIOPLASTY WITH STENT PLACEMENT  03-19-2005  dr Gwyndolyn Saxon downey   inferior STEMI--- DES x4 to RCA w/ balloon angioplasty and balloon angioplasty to jailed PDA ostium/  severe hypokinesis of midinferor wall, ef 50%/  dLM 20%,  mLAD 20%,  dCFX 60%   ESOPHAGOGASTRODUODENOSCOPY  01/2017   dilated benign esophageal stenosis, portal hypertensive gastropathy Henrene Pastor)   ESOPHAGOGASTRODUODENOSCOPY (EGD) WITH PROPOFOL N/A 02/20/2018   benign biopsy Vicente Males, Bailey Mech, MD)   ESOPHAGOGASTRODUODENOSCOPY (EGD) WITH PROPOFOL N/A 05/12/2019   Procedure: ESOPHAGOGASTRODUODENOSCOPY (EGD) WITH PROPOFOL;  Surgeon: [name], MD;  Location: [facility] ENDOSCOPY;  Service: Gastroenterology;  Laterality: N/A;   HYDRADENITIS EXCISION Right 12/31/2014   Procedure: WIDE EXCISION HIDRADENITIS GROIN; Coralie Keens, MD   IR PARACENTESIS  03/25/2019   LUMBAR DISC SURGERY     L5-S1   LUMBAR LAMINECTOMY  05-18-2010   L2--5   laminectomy/foraminotomy for  stenosis ([place])   MYELOGRAM     L5-S1 and L1-2 spondylosis   SACROILIAC JOINT INJECTION Bilateral 10/2013   [place]   TONSILLECTOMY AND ADENOIDECTOMY  1972     A IV Location/Drains/Wounds Patient Lines/Drains/Airways Status     Active Line/Drains/Airways     Name Placement date Placement time Site Days   Peripheral IV 07/07/22 18 G Left Antecubital 07/07/22  2215  Antecubital  1   Airway 02/20/18  1330  -- 1599   Incision (Closed) 12/31/14 Groin Right 12/31/14  0745  -- 2746   Incision (Closed) 11/30/15 Groin Left 11/30/15  1333  -- 2412   Incision (Closed) 11/30/15 Groin Right 11/30/15  1333  -- 2412  Intake/Output Last 24 hours  Intake/Output Summary (Last 24 hours) at 07/08/2022 0800 Last data filed at 07/08/2022 0240 Gross per 24 hour  Intake --  Output 480 ml  Net -480 ml    Labs/Imaging Results for orders placed or performed during the hospital encounter of 07/07/22 (from the past 48 hour(s))  Ammonia     Status: Abnormal   Collection Time: 07/07/22 10:08 PM  Result Value Ref Range   Ammonia 39 (H) 9 - 35 umol/L    Comment: Performed at [facility], [place] [address] [place], [place] [address]  CBC with Differential     Status: Abnormal   Collection Time: 07/07/22 10:15 PM  Result Value Ref Range   WBC 6.0 4.0 - 10.5 K/uL   RBC 2.79 (L) 4.22 - 5.81 MIL/uL   Hemoglobin 9.8 (L) 13.0 - 17.0 g/dL   HCT 30.4 (L) 39.0 - 52.0 %   MCV 109.0 (H) 80.0 - 100.0 fL   MCH 35.1 (H) 26.0 - 34.0 pg   MCHC 32.2 30.0 - 36.0 g/dL   RDW 17.5 (H) 11.5 - 15.5 %   Platelets 48 (L) 150 - 400 K/uL    Comment: SPECIMEN CHECKED FOR CLOTS Immature Platelet Fraction may be clinically indicated, consider ordering this additional test [id number] REPEATED TO VERIFY PLATELET COUNT CONFIRMED BY SMEAR    nRBC 0.0 0.0 - 0.2 %   Neutrophils Relative % 87 %   Neutro Abs 5.2 1.7 - 7.7 K/uL   Lymphocytes Relative 6 %   Lymphs Abs 0.4 (L) 0.7 - 4.0 K/uL    Monocytes Relative 7 %   Monocytes Absolute 0.4 0.1 - 1.0 K/uL   Eosinophils Relative 0 %   Eosinophils Absolute 0.0 0.0 - 0.5 K/uL   Basophils Relative 0 %   Basophils Absolute 0.0 0.0 - 0.1 K/uL   Immature Granulocytes 0 %   Abs Immature Granulocytes 0.01 0.00 - 0.07 K/uL    Comment: Performed at [facility], [place] [address], [place], [place] [address]  Protime-INR     Status: Abnormal   Collection Time: 07/07/22 10:15 PM  Result Value Ref Range   Prothrombin Time 17.3 (H) 11.4 - 15.2 seconds   INR 1.4 (H) 0.8 - 1.2    Comment: (NOTE) INR goal varies based on device and disease states. Performed at [facility], [place] [address], [place], [place] [address]   Type and screen [place]     Status: None   Collection Time: 07/07/22 10:15 PM  Result Value Ref Range   ABO/RH(D) O POS    Antibody Screen NEG    Sample Expiration      07/10/2022,2359 Performed at [facility], [place] [address], [place], [place] [address]   Comprehensive metabolic panel     Status: Abnormal   Collection Time: 07/07/22 10:15 PM  Result Value Ref Range   Sodium 133 (L) 135 - 145 mmol/L   Potassium 4.0 3.5 - 5.1 mmol/L   Chloride 103 98 - 111 mmol/L   CO2 25 22 - 32 mmol/L   Glucose, Bld 129 (H) 70 - 99 mg/dL    Comment: Glucose reference range applies only to samples taken after fasting for at least 8 hours.   BUN 19 8 - 23 mg/dL   Creatinine, Ser 1.08 0.61 - 1.24 mg/dL   Calcium 7.8 (L) 8.9 - 10.3 mg/dL   Total Protein 7.6 6.5 - 8.1 g/dL  Albumin 2.3 (L) 3.5 - 5.0 g/dL   AST 52 (H) 15 - 41 U/L   ALT 29 0 - 44 U/L   Alkaline Phosphatase 127 (H) 38 - 126 U/L   Total Bilirubin 2.7 (H) 0.3 - 1.2 mg/dL   GFR, Estimated >60 >60 mL/min    Comment: (NOTE) Calculated using the CKD-EPI Creatinine Equation (2021)    Anion gap 5 5 - 15    Comment: Performed at [facility], [place] [address] [place], [place]  [address]  Urinalysis, Routine w reflex microscopic -Urine, Clean Catch     Status: Abnormal   Collection Time: 07/08/22  2:32 AM  Result Value Ref Range   Color, Urine YELLOW YELLOW   APPearance CLEAR CLEAR   Specific Gravity, Urine 1.015 1.005 - 1.030   pH 6.0 5.0 - 8.0   Glucose, UA NEGATIVE NEGATIVE mg/dL   Hgb urine dipstick MODERATE (A) NEGATIVE   Bilirubin Urine NEGATIVE NEGATIVE   Ketones, ur NEGATIVE NEGATIVE mg/dL   Protein, ur NEGATIVE NEGATIVE mg/dL   Nitrite NEGATIVE NEGATIVE   Leukocytes,Ua NEGATIVE NEGATIVE   RBC / HPF 11-20 0 - 5 RBC/hpf   WBC, UA 0-5 0 - 5 WBC/hpf   Bacteria, UA NONE SEEN NONE SEEN   Squamous Epithelial / HPF 0-5 0 - 5 /HPF   Mucus PRESENT     Comment: Performed at [facility], [place] [address], [place], [place] [address]   *Note: Due to a large number of results and/or encounters for the requested time period, some results have not been displayed. A complete set of results can be found in Results Review.   CT Head Wo Contrast  Result Date: 07/07/2022 CLINICAL DATA:  Mental status change of unknown cause. Slip and fall injury. EXAM: CT HEAD WITHOUT CONTRAST TECHNIQUE: Contiguous axial images were obtained from the base of the skull through the vertex without intravenous contrast. RADIATION DOSE REDUCTION: This exam was performed according to the departmental dose-optimization program which includes automated exposure control, adjustment of the mA and/or kV according to patient size and/or use of iterative reconstruction technique. COMPARISON:  05/03/2022 FINDINGS: Brain: Diffuse cerebral atrophy. Ventricular dilatation consistent with central atrophy. Low-attenuation changes in the deep white matter consistent with small vessel ischemia. No abnormal extra-axial fluid collections. No mass effect or midline shift. Gray-white matter junctions are distinct. Basal cisterns are not effaced. No acute intracranial hemorrhage. Vascular: No hyperdense  vessel or unexpected calcification. Skull: Calvarium appears intact. Sinuses/Orbits: Paranasal sinuses and mastoid air cells are clear. Other: None. IMPRESSION: No acute intracranial abnormalities. Chronic atrophy and small vessel ischemic changes similar to prior study. Electronically Signed   By: Lucienne Capers M.D.   On: 07/07/2022 23:52   DG Hip Port Scissors or [place] Pelvis 1 View Right  Result Date: 07/07/2022 CLINICAL DATA:  Status post right hip reduction. EXAM: DG HIP (WITH OR WITHOUT PELVIS) 1V PORT RIGHT COMPARISON:  07/07/2022. FINDINGS: Total hip arthroplasty changes are noted on the right. There has been successful relocation of the femoral component at the acetabular component. No obvious fracture or evidence of hardware loosening. There are mild degenerative changes at the left hip. An aortic endograft and common iliac artery stents are noted. IMPRESSION: Status post right hip arthroplasty with successful relocation of the femoral component at the acetabulum. No obvious fracture or evidence of hardware loosening. Electronically Signed   By: Brett Fairy M.D.   On: 07/07/2022 23:49   DG Hip Unilat W  or Wo Pelvis 2-3 Views Right  Result Date: 07/07/2022 CLINICAL DATA:  [place], right hip pain EXAM: DG HIP (WITH OR WITHOUT PELVIS) 2-3V RIGHT COMPARISON:  02/05/2019 FINDINGS: Frontal view of the pelvis as well as frontal and frogleg lateral views of the right hip are obtained. There is superior dislocation of the femoral component of the right hip arthroplasty. On the provided images is difficult to tell whether the femoral component is posterior or anterior to the acetabular component. No evidence of acute displaced fracture. Soft tissues are unremarkable. IMPRESSION: 1. Superior dislocation of the femoral component of the right hip arthroplasty. On the provided images, it is difficult to tell whether the femoral component lies anterior or posterior to the acetabular component. 2. No acute displaced  fracture. Electronically Signed   By: Randa Ngo M.D.   On: 07/07/2022 22:17    Pending Labs Unresulted Labs (From admission, onward)    None       Vitals/Pain Today's Vitals   07/08/22 0300 07/08/22 0330 07/08/22 0455 07/08/22 0500  BP: 136/66 127/62  (!) 122/57  Pulse: 95 96    Resp: '16 17  16  '$ Temp:   99.1 F (37.3 C)   TempSrc:   Oral   SpO2: 94% 94%    Weight:      PainSc:        Isolation Precautions No active isolations  Medications Medications  fentaNYL (SUBLIMAZE) injection 50 mcg (50 mcg Intravenous Given 07/08/22 0109)  propofol (DIPRIVAN) 10 mg/mL bolus/IV push 40 mg (40 mg Intravenous Given 07/07/22 2254)    Mobility non-ambulatory     Focused Assessments Musculskeletal  - R hip reduction in ED   R Recommendations: See Admitting Provider Note  Report given to:   Additional Notes:

## 2022-07-08 NOTE — Evaluation (Signed)
Physical Therapy ONE TIME Evaluation Patient Details Name: [name] MRN: [id number] DOB: 06/26/1955 Today's Date: 07/08/2022  History of Present Illness  67 y.o. male  presented with a fall, reports that he was getting up from his chair  when he slipped on something wet. He fell to the ground doing a split resulting in R hip dislocation, now s/p closed reduction R hip. PMH: cirrhosis, portal HTN, COPD, chronic pain, anemia, chronic combined HF, chronic thrombocytopenia.  Clinical Impression  Patient evaluated by Physical Therapy with no further acute PT needs identified. All education has been completed and the patient has no further questions.  PT is LTC resident on hospice care at [facility], recommend return at d/c.  See below for any follow-up Physical Therapy or equipment needs. PT is signing off. Thank you for this referral.     Recommendations for follow up therapy are one component of a multi-disciplinary discharge planning process, led by the attending physician.  Recommendations may be updated based on patient status, additional functional criteria and insurance authorization.  Follow Up Recommendations PT at Long-term acute care hospital      Assistance Recommended at Discharge Frequent or constant Supervision/Assistance  Patient can return home with the following  A lot of help with walking and/or transfers;A lot of help with bathing/dressing/bathroom;Assist for transportation;Help with stairs or ramp for entrance;Assistance with cooking/housework;Direct supervision/assist for financial management;Direct supervision/assist for medications management    Equipment Recommendations None recommended by PT  Recommendations for Other Services       Functional Status Assessment Patient has not had a recent decline in their functional status     Precautions / Restrictions Precautions Precautions: Fall Restrictions Weight Bearing Restrictions: No Other Position/Activity  Restrictions: R hip closed reduction-no orders/precautions noted      Mobility  Bed Mobility Overal bed mobility: Needs Assistance Bed Mobility: Supine to Sit, Sit to Supine     Supine to sit: Min assist, Mod assist Sit to supine: +2 for safety/equipment   General bed mobility comments: assist to elevate trunk, progress RLE off bed;  assist To lift RLE on to bed for return to supine    Transfers                   General transfer comment: lateral scooting along EOB, 3/4 stand for pad change d/t bed soaked in urine    Ambulation/Gait                  Stairs            Wheelchair Mobility    Modified Rankin (Stroke Patients Only)       Balance Overall balance assessment: Needs assistance Sitting-balance support: Feet supported, No upper extremity supported Sitting balance-Leahy Scale: Fair         Standing balance comment: NT                             Pertinent Vitals/Pain Pain Assessment Pain Assessment: Faces Faces Pain Scale: Hurts even more Pain Location: right LE Pain Descriptors / Indicators: Grimacing, Guarding Pain Intervention(s): Limited activity within patient's tolerance, Monitored during session, Repositioned    Home Living                     Additional Comments: per SW/TOC pt is LTC resident at [facility]; pt states he is at a facility for ST rehab on [address]    Prior Function  Prior Level of Function : Patient poor historian/Family not available             Mobility Comments: Pt reports he has not been walking but a few feet with his rollator as of lately due to recent falls.       Hand Dominance        Extremity/Trunk Assessment   Upper Extremity Assessment Upper Extremity Assessment: Defer to OT evaluation    Lower Extremity Assessment Lower Extremity Assessment: RLE deficits/detail;LLE deficits/detail RLE: Unable to fully assess due to pain LLE Deficits / Details: grossly 3/5,  body habitus limits ROM       Communication   Communication: No difficulties  Cognition Arousal/Alertness: Awake/alert Behavior During Therapy: WFL for tasks assessed/performed Overall Cognitive Status: No family/caregiver present to determine baseline cognitive functioning                                          General Comments      Exercises     Assessment/Plan    PT Assessment Patient does not need any further PT services  PT Problem List         PT Treatment Interventions      PT Goals (Current goals can be found in the Care Plan section)  Acute Rehab PT Goals PT Goal Formulation: All assessment and education complete, DC therapy    Frequency       Co-evaluation               AM-PAC PT "6 Clicks" Mobility  Outcome Measure Help needed turning from your back to your side while in a flat bed without using bedrails?: A Lot Help needed moving from lying on your back to sitting on the side of a flat bed without using bedrails?: A Lot Help needed moving to and from a bed to a chair (including a wheelchair)?: A Lot Help needed standing up from a chair using your arms (e.g., wheelchair or bedside chair)?: Total Help needed to walk in hospital room?: Total Help needed climbing 3-5 steps with a railing? : Total 6 Click Score: 9    End of Session   Activity Tolerance: Patient tolerated treatment well Patient left: in bed;with call bell/phone within reach;with bed alarm set Nurse Communication: Mobility status PT Visit Diagnosis: Other abnormalities of gait and mobility (R26.89)    Time: 6073-7106 PT Time Calculation (min) (ACUTE ONLY): 29 min   Charges:   PT Evaluation $PT Eval Low Complexity: 1 Low PT Treatments $Therapeutic Activity: 8-22 mins        Baxter Flattery, PT  Acute Rehab Dept [facility]) [phone]  [facility] Weekend Pager (Saturday/Sunday only)  [phone]  07/08/2022   [facility] 07/08/2022, 3:09 PM

## 2022-07-08 NOTE — Progress Notes (Signed)
Dressing Changes and una boot  placement will start with Day shift when the Ortho Tech is in house to place.

## 2022-07-08 NOTE — [place] FL2 (Signed)
[place] MEDICAID FL2 LEVEL OF CARE FORM     IDENTIFICATION  Patient Name: [name] Birthdate: 1956/05/20 Sex: male Admission Date (Current Location): 07/07/2022  [facility] and [place] Number:  Herbalist and Address:  [facility],  [place], [place]      Provider Number: [id number]  Attending Physician Name and Address:  Jonnie Finner, DO  Relative Name and Phone Number:       Current Level of Care: Hospital Recommended Level of Care: Nursing Facility Prior Approval Number:    Date Approved/Denied:   PASRR Number:    Discharge Plan: SNF    Current Diagnoses: Patient Active Problem List   Diagnosis Date Noted   Encephalopathy 07/08/2022   Portal hypertension ([place]) 07/08/2022   Closed dislocation of right hip ([place]) 07/08/2022   Bilateral cellulitis of lower leg 06/07/2022   COVID-19 virus infection 05/04/2022   Hepatic encephalopathy ([place]) 04/15/2022   Hospice care patient 02/01/2022   Cellulitis of perineum    Scrotal swelling    Scrotal pain    Edema    Chronic combined systolic and diastolic heart failure ([place]) 01/31/2022   Cellulitis, scrotum 01/31/2022   Venous stasis ulcer of left lower leg with edema of left lower leg ([place]) 01/06/2022   Lymphadenopathy of head and neck region 12/15/2021   FTT (failure to thrive) in adult 11/16/2021   Driving safety issue 10/06/2021   Decreased visual acuity 10/06/2021   Cognitive safety issue 06/27/2021   Status post right hip replacement 12/04/2020   Carotid stenosis, asymptomatic, bilateral 08/28/2020   Statin intolerance 08/05/2020   Mass of both parotid glands 08/05/2020   Memory deficit 01/10/2020   Fall at home, initial encounter 02/04/2019   Pancytopenia ([place]) 09/18/2018   Hearing loss 08/07/2018   Abdominal aortic aneurysm (AAA) without rupture ([place]) 01/02/2018   History of MRSA infection 01/02/2018   History of myocardial infarction 01/02/2018   Aortic  atherosclerosis ([place]) 01/01/2018   MDD (major depressive disorder), recurrent severe, without psychosis ([place]) 11/17/2017   Candidal intertrigo 05/08/2017   Falls frequently 03/13/2017   Abnormal drug screen 07/09/2016   Thrombocytopenia ([place]) 04/29/2016   Anemia of chronic disease 04/29/2016   Protein-calorie malnutrition ([place]) 04/29/2016   RUQ abdominal pain 04/26/2016   Encounter for chronic pain management 01/04/2016   Preop cardiovascular exam 09/01/2014   Generalized weakness 07/22/2014   Esophageal dysphagia 07/22/2014   Gross hematuria 05/19/2014   Encounter for general adult medical examination with abnormal findings 04/21/2014   Advanced care planning/counseling discussion 04/21/2014   Orthostatic hypotension 03/13/2014   Cirrhosis of liver not due to alcohol ([place]) 01/03/2014   Osteoarthritis of right hip 08/16/2012   Medicare annual wellness visit, subsequent 06/07/2012   Chronic dyspnea 10/06/2011   DDD (degenerative disc disease), cervical    Vitamin D deficiency 03/14/2011   Vitamin B12 deficiency 08/05/2010   Prediabetes 06/27/2010   OSA (obstructive sleep apnea) 05/16/2010   Cervical spondylosis 05/05/2010   Obesity, morbid, BMI 40.0-49.9 ([place]) 05/03/2010   Hidradenitis 05/03/2010   Smoker 10/21/2009   HLD (hyperlipidemia) 09/07/2009   Gout 09/07/2009   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   Essential hypertension 09/07/2009   CAD (coronary artery disease) 09/07/2009   CARDIOMYOPATHY 09/07/2009   COPD (chronic obstructive pulmonary disease) ([place]) 09/07/2009   Chronic pain syndrome 09/07/2009    Orientation RESPIRATION BLADDER Height & Weight     Self, Time, Situation, Place  Normal Continent Weight: (!) 317 lb 0.3 oz (  143.8 kg) Height:  '5\' 9"'$  (175.3 cm)  BEHAVIORAL SYMPTOMS/MOOD NEUROLOGICAL BOWEL NUTRITION STATUS      Continent Diet (Regular)  AMBULATORY STATUS COMMUNICATION OF NEEDS Skin   Limited Assist Verbally Normal                        Personal Care Assistance Level of Assistance  Bathing, Feeding, Dressing Bathing Assistance: Limited assistance Feeding assistance: Independent Dressing Assistance: Limited assistance     Functional Limitations Info  Sight, Hearing, Speech Sight Info: Adequate Hearing Info: Adequate Speech Info: Adequate    SPECIAL CARE FACTORS FREQUENCY                       Contractures Contractures Info: Not present    Additional Factors Info  Code Status, Allergies Code Status Info: FULL Allergies Info: Statins, Losartan, Wellbutrin (Bupropion), Allopurinol, Baclofen, Penicillins, Tramadol           Current Medications (07/08/2022):  This is the current hospital active medication list Current Facility-Administered Medications  Medication Dose Route Frequency Provider Last Rate Last Admin   albuterol (PROVENTIL) (2.5 MG/3ML) 0.083% nebulizer solution 2.5 mg  2.5 mg Nebulization Q6H PRN Marylyn Ishihara, Tyrone A, DO       [START ON 07/09/2022] fentaNYL (DURAGESIC) 75 MCG/HR 1 patch  1 patch Transdermal Q72H Kyle, Tyrone A, DO       [START ON 07/09/2022] ferrous sulfate tablet 325 mg  325 mg Oral QODAY Kyle, Tyrone A, DO       folic acid (FOLVITE) tablet 1 mg  1 mg Oral Daily Kyle, Tyrone A, DO   1 mg at 07/08/22 1116   [START ON 07/09/2022] furosemide (LASIX) tablet 80 mg  80 mg Oral Daily Kyle, Tyrone A, DO       gabapentin (NEURONTIN) capsule 300 mg  300 mg Oral TID Marylyn Ishihara, Tyrone A, DO   300 mg at 07/08/22 1116   lactulose (CHRONULAC) 10 GM/15ML solution 20 g  20 g Oral TID Marylyn Ishihara, Tyrone A, DO   20 g at 07/08/22 1118   nicotine (NICODERM CQ - dosed in mg/24 hours) patch 14 mg  14 mg Transdermal Daily Kyle, Tyrone A, DO   14 mg at 07/08/22 1120   ondansetron (ZOFRAN) tablet 4 mg  4 mg Oral Q8H PRN Marylyn Ishihara, Tyrone A, DO       oxyCODONE (Oxy IR/ROXICODONE) immediate release tablet 5 mg  5 mg Oral Q8H PRN Marylyn Ishihara, Tyrone A, DO   5 mg at 07/08/22 1203   pantoprazole (PROTONIX) EC tablet 80 mg  80 mg Oral  Daily Kyle, Tyrone A, DO   80 mg at 07/08/22 1116   rifaximin (XIFAXAN) tablet 550 mg  550 mg Oral BID Marylyn Ishihara, Tyrone A, DO   550 mg at 07/08/22 1116   spironolactone (ALDACTONE) tablet 50 mg  50 mg Oral Daily Kyle, Tyrone A, DO   50 mg at 07/08/22 1118   umeclidinium bromide (INCRUSE ELLIPTA) 62.5 MCG/ACT 1 puff  1 puff Inhalation Daily Kyle, Tyrone A, DO   1 puff at 07/08/22 1119     Discharge Medications: Please see discharge summary for a list of discharge medications.  Relevant Imaging Results:  Relevant Lab Results:   Additional Information SSN [id number]  Vassie Moselle, LCSW

## 2022-07-08 NOTE — H&P (Signed)
History and Physical    Patient: [name] [id number] DOB: September 26, 1955 DOA: 07/07/2022 DOS: the patient was seen and examined on 07/08/2022 PCP: Ria Bush, MD  Patient coming from: SNF  Chief Complaint:  Chief Complaint  Patient presents with   Fall   HPI: [name] is a 67 y.o. male with medical history significant of cirrhosis, portal HTN, COPD, chronic pain, anemia, chronic combined HF, chronic thrombocytopenia. Presenting with a fall. He reports that he was getting up from his chair yesterday evening, when he slipped on something wet. He fell to the ground doing a split. He was unable to get up on his own. He didn't hit his head or pass out. He had right hip pain after. The SNF staff found him and assisted him up. They then called for EMS. He denies any other aggravating or alleviating factors.   Review of Systems: As mentioned in the history of present illness. All other systems reviewed and are negative. Past Medical History:  Diagnosis Date   AAA (abdominal aortic aneurysm) ([place]) 09/2012--  monitored by dr Trula Slade   stable 5.6cm CTA abdomen 2016   Abnormal drug screen 07/09/2016   1/2/018 - positive oxycodone, fentanyl, inapprop positive MJ - mod risk   Allergic rhinitis    Ascites 03/2019   B12 deficiency    CAD (coronary artery disease) cardiologist-  dr Stanford Breed   x3 with stents last 2005, EF 40%, predominantly RCA by CT 2016   Cataracts, bilateral    Cervical spondylosis 05/2010   s/p surgery   Charcot Lelan Pons Tooth muscular atrophy dx  1975   neurologist--  dr love--  type 2 per pt   Chronic pain syndrome    [facility] with [facility] --> disagreement and transfered care to Dr Sanjuan Dame at [facility], requests PCP write Rx but f/u with pain clinic Q6-12 months   COPD (chronic obstructive pulmonary disease) ([place]) 10/2011   minimal by PFTs   DDD (degenerative disc disease)    Disturbances of sensation of smell and  taste    improving   Dyspnea on exertion    GERD (gastroesophageal reflux disease)    Gout    Headache    Hepatitis    hepatitis B   Hidradenitis    right groin   Hidradenitis suppurativa dx 2011   goin and leg crease   followd by Lyndle Herrlich - daily bactrim, s/p intralesional steroid injection 10/2010   Hip osteoarthritis    s/p intraarticular steroid shot (12/2012) ([place])   History of hepatitis B 1983   History of MI (myocardial infarction)    2000  &  2005   History of pneumonia    History of viral meningitis 2000   HLD (hyperlipidemia)    HTN (hypertension)    Ischemic cardiomyopathy    s/p inferior MI  --  current ef per myoview 39%   Liver cirrhosis secondary to NASH ([place]) 01/2014   by CT scan, rec virtual colonoscopy by Dr Collene Mares 06/2014   Lumbar herniated disc    Myocardial infarction ([place])    x2   Nocturia more than twice per night    Obesity    Spinal stenosis    released from [place].  established with [facility] (07/2013)   T2DM (type 2 diabetes mellitus) ([place])    ABIs WNL 2016   Vitamin D deficiency    Past Surgical History:  Procedure Laterality Date   ABDOMINAL AORTIC ENDOVASCULAR FENESTRATED STENT  GRAFT N/A 11/30/2015   Procedure: ABDOMINAL AORTIC ENDOVASCULAR FENESTRATED STENT GRAFT;  Surgeon: Serafina Mitchell, MD;  Location: [facility];  Service: Vascular;  Laterality: N/A;   ANTERIOR CERVICAL DECOMP/DISCECTOMY FUSION  01-07-2010    C4 -- C7   CARDIAC CATHETERIZATION  03-30-2005  dr Albertine Patricia   ef 40% w/ inferior akinesis/  LM and CFX angiographically normal/  pLAD 30%/   Widely patent stents in RCA and PDA widely patent   CARDIOVASCULAR STRESS TEST  10-23-2012  dr Stanford Breed   No ischemia/  Moderate scar in the inferior wall, otherwise normal perfusion/  LV ef 39%,  LV wall motion: inferior/ inferolateral hypokinesis   COLONOSCOPY  05/06/2007   normal, small int hemorrhoids rpt 5 yrs due to fmhx - rec against rpt colonoscopy by Dr Collene Mares   CORONARY ANGIOPLASTY   2000  dr Stanford Breed   PCI to RCA and PDA   CORONARY ANGIOPLASTY WITH STENT PLACEMENT  03-19-2005  dr Gwyndolyn Saxon [place]   inferior STEMI--- DES x4 to RCA w/ balloon angioplasty and balloon angioplasty to jailed PDA ostium/  severe hypokinesis of midinferor wall, ef 50%/  dLM 20%,  mLAD 20%,  dCFX 60%   ESOPHAGOGASTRODUODENOSCOPY  01/2017   dilated benign esophageal stenosis, portal hypertensive gastropathy Henrene Pastor)   ESOPHAGOGASTRODUODENOSCOPY (EGD) WITH PROPOFOL N/A 02/20/2018   benign biopsy Vicente Males, Bailey Mech, MD)   ESOPHAGOGASTRODUODENOSCOPY (EGD) WITH PROPOFOL N/A 05/12/2019   Procedure: ESOPHAGOGASTRODUODENOSCOPY (EGD) WITH PROPOFOL;  Surgeon: [name], MD;  Location: [facility] ENDOSCOPY;  Service: Gastroenterology;  Laterality: N/A;   HYDRADENITIS EXCISION Right 12/31/2014   Procedure: WIDE EXCISION HIDRADENITIS GROIN; Coralie Keens, MD   IR PARACENTESIS  03/25/2019   LUMBAR [place] SURGERY     L5-S1   LUMBAR LAMINECTOMY  05-18-2010   L2--5   laminectomy/foraminotomy for stenosis ([place])   MYELOGRAM     L5-S1 and L1-2 spondylosis   SACROILIAC JOINT INJECTION Bilateral 10/2013   [place]   TONSILLECTOMY AND ADENOIDECTOMY  1972   Social History:  reports that he has been smoking cigarettes and e-cigarettes. He started smoking about 55 years ago. He has a 57.00 pack-year smoking history. He has never used smokeless tobacco. He reports current drug use. Drugs: Fentanyl and Hydrocodone. He reports that he does not drink alcohol.  Allergies  Allergen Reactions   Statins Shortness Of Breath    Cough, trouble breathing Cough, trouble breathing   Losartan Other (See Comments)    Causes him to have pain   Wellbutrin [Bupropion] Other (See Comments)    Worsened mood - crying   Allopurinol Nausea Only   Baclofen Nausea And Vomiting   Penicillins Nausea And Vomiting    Did it involve swelling of the face/tongue/throat, SOB, or low BP? N/A Did it involve sudden or severe rash/hives, skin peeling, or any  reaction on the inside of your mouth or nose? N/A Did you need to seek medical attention at a hospital or doctor's office? N/A When did it last happen? Child     If all above answers are "NO", may proceed with cephalosporin use.   Tramadol Nausea Only    Family History  Problem Relation Age of Onset   Cancer Mother        colon   Diabetes Mother    Kidney disease Mother    Aneurysm Mother        AAA   Rheum arthritis Mother    Charcot-Marie-Tooth disease Mother    Heart disease Mother  before age 65   Cancer Father        skin   Heart attack Father    Heart disease Father        before age 68   Cancer Brother        skin   Coronary artery disease Brother    Cancer Brother        small cell lung cancer   Aneurysm Brother        AAA   Rheum arthritis Sister    Rheum arthritis Brother    Prostate cancer Neg Hx    Bladder Cancer Neg Hx    Kidney cancer Neg Hx     Prior to Admission medications   Medication Sig Start Date End Date Taking? Authorizing Provider  albuterol (PROVENTIL) (2.5 MG/3ML) 0.083% nebulizer solution USE 1 VIAL PER NEBULIZER EVERY 6 HRS AS NEEDED FOR WHEEZING Patient taking differently: Take 2.5 mg by nebulization every 6 (six) hours as needed for wheezing or shortness of breath. 12/03/20  Yes Ria Bush, MD  albuterol (VENTOLIN HFA) 108 (90 Base) MCG/ACT inhaler TAKE 2 PUFFS BY MOUTH EVERY 6 HOURS AS NEEDED FOR WHEEZE OR SHORTNESS OF BREATH Patient taking differently: Inhale 2 puffs into the lungs every 6 (six) hours as needed for wheezing or shortness of breath. 12/14/21  Yes Ria Bush, MD  fentaNYL (DURAGESIC) 75 MCG/HR Place 1 patch onto the skin every 3 (three) days. 05/25/22  Yes Ria Bush, MD  ferrous sulfate 325 (65 FE) MG tablet Take 1 tablet (325 mg total) by mouth every other day. 04/29/19  Yes Ria Bush, MD  folic acid (FOLVITE) 1 MG tablet TAKE 1 TABLET BY MOUTH EVERY DAY Patient taking differently: Take 1 mg  by mouth daily. 11/07/21  Yes Ria Bush, MD  furosemide (LASIX) 40 MG tablet Take 2 tablets (80 mg total) by mouth daily. 06/24/22  Yes Ria Bush, MD  gabapentin (NEURONTIN) 300 MG capsule Take 1 capsule (300 mg total) by mouth in the morning and at bedtime. 06/27/21  Yes Ria Bush, MD  Lactulose 20 GM/30ML SOLN Take 30 mLs (20 g total) by mouth in the morning, at noon, in the evening, and at bedtime. 05/09/22 08/07/22 Yes Elgergawy, Silver Huguenin, MD  Misc Natural Products (TART CHERRY ADVANCED) CAPS Take 1 capsule by mouth 2 (two) times daily.   Yes [provider]  nicotine (NICODERM CQ - DOSED IN MG/24 HOURS) 14 mg/24hr patch APPLY 1 PATCH ONTO THE SKIN EVERY DAY 02/09/22  Yes Ria Bush, MD  nitroGLYCERIN (NITROSTAT) 0.4 MG SL tablet Place 1 tablet (0.4 mg total) under the tongue every 5 (five) minutes as needed for chest pain. 10/10/12  Yes Lelon Perla, MD  omeprazole (PRILOSEC) 40 MG capsule TAKE 1 CAPSULE BY MOUTH EVERY DAY Patient taking differently: Take 40 mg by mouth daily. 12/14/21  Yes Ria Bush, MD  ondansetron (ZOFRAN) 4 MG tablet TAKE 1 TABLET BY MOUTH EVERY 8 HOURS AS NEEDED FOR NAUSEA AND VOMITING 01/09/22  Yes [name], MD  oxyCODONE (OXY IR/ROXICODONE) 5 MG immediate release tablet Take 5 mg by mouth every 8 (eight) hours as needed for severe pain. 06/16/22  Yes [provider]  spironolactone (ALDACTONE) 100 MG tablet Take 0.5 tablets (50 mg total) by mouth daily. 05/09/22  Yes Elgergawy, Silver Huguenin, MD  Tiotropium Bromide Monohydrate (SPIRIVA RESPIMAT) 2.5 MCG/ACT AERS Inhale 2 puffs into the lungs daily. 10/04/21  Yes Ria Bush, MD  vitamin B-12 (CYANOCOBALAMIN) 500 MCG tablet  Take 1 tablet (500 mcg total) by mouth every Monday, Wednesday, and Friday. 12/27/18  Yes Ria Bush, MD  XIFAXAN 550 MG TABS tablet Take 550 mg by mouth 2 (two) times daily. 06/24/22  Yes [provider]  nystatin cream (MYCOSTATIN) APPLY TO  AFFECTED AREA TWICE A DAY Patient not taking: Reported on 07/08/2022 07/19/21   Billey Co, MD    Physical Exam: Vitals:   07/08/22 0455 07/08/22 0500 07/08/22 0809 07/08/22 0827  BP:  (!) 122/57 (!) 114/56 (!) 109/54  Pulse:   89 88  Resp:  '16 13 16  '$ Temp: 99.1 F (37.3 C)  98.6 F (37 C) 98.7 F (37.1 C)  TempSrc: Oral  Oral Oral  SpO2:   96% 99%  Weight:    (!) 143.8 kg   General: 67 y.o. male resting in bed in NAD Eyes: PERRL, normal sclera ENMT: Nares patent w/o discharge, orophaynx clear, dentition normal, ears w/o discharge/lesions/ulcers Neck: Supple, trachea midline Cardiovascular: RRR, +S1, S2, no m/g/r, equal pulses throughout Respiratory: CTABL, no w/r/r, normal WOB GI: BS+, NDNT, no masses noted, no organomegaly noted MSK: No c/c; BLE chronic edema/wrappings CDI Neuro: A&O x 3, no focal deficits Psyc: Appropriate interaction but he seems a little confused, calm/cooperative  Data Reviewed:  Results for orders placed or performed during the hospital encounter of 07/07/22 (from the past 24 hour(s))  Ammonia     Status: Abnormal   Collection Time: 07/07/22 10:08 PM  Result Value Ref Range   Ammonia 39 (H) 9 - 35 umol/L  CBC with Differential     Status: Abnormal   Collection Time: 07/07/22 10:15 PM  Result Value Ref Range   WBC 6.0 4.0 - 10.5 K/uL   RBC 2.79 (L) 4.22 - 5.81 MIL/uL   Hemoglobin 9.8 (L) 13.0 - 17.0 g/dL   HCT 30.4 (L) 39.0 - 52.0 %   MCV 109.0 (H) 80.0 - 100.0 fL   MCH 35.1 (H) 26.0 - 34.0 pg   MCHC 32.2 30.0 - 36.0 g/dL   RDW 17.5 (H) 11.5 - 15.5 %   Platelets 48 (L) 150 - 400 K/uL   nRBC 0.0 0.0 - 0.2 %   Neutrophils Relative % 87 %   Neutro Abs 5.2 1.7 - 7.7 K/uL   Lymphocytes Relative 6 %   Lymphs Abs 0.4 (L) 0.7 - 4.0 K/uL   Monocytes Relative 7 %   Monocytes Absolute 0.4 0.1 - 1.0 K/uL   Eosinophils Relative 0 %   Eosinophils Absolute 0.0 0.0 - 0.5 K/uL   Basophils Relative 0 %   Basophils Absolute 0.0 0.0 - 0.1 K/uL    Immature Granulocytes 0 %   Abs Immature Granulocytes 0.01 0.00 - 0.07 K/uL  Protime-INR     Status: Abnormal   Collection Time: 07/07/22 10:15 PM  Result Value Ref Range   Prothrombin Time 17.3 (H) 11.4 - 15.2 seconds   INR 1.4 (H) 0.8 - 1.2  Type and screen [place]     Status: None   Collection Time: 07/07/22 10:15 PM  Result Value Ref Range   ABO/RH(D) O POS    Antibody Screen NEG    Sample Expiration      07/10/2022,2359 Performed at [facility], [address], [place], [place] [address]   Comprehensive metabolic panel     Status: Abnormal   Collection Time: 07/07/22 10:15 PM  Result Value Ref Range   Sodium 133 (L) 135 - 145 mmol/L  Potassium 4.0 3.5 - 5.1 mmol/L   Chloride 103 98 - 111 mmol/L   CO2 25 22 - 32 mmol/L   Glucose, Bld 129 (H) 70 - 99 mg/dL   BUN 19 8 - 23 mg/dL   Creatinine, Ser 1.08 0.61 - 1.24 mg/dL   Calcium 7.8 (L) 8.9 - 10.3 mg/dL   Total Protein 7.6 6.5 - 8.1 g/dL   Albumin 2.3 (L) 3.5 - 5.0 g/dL   AST 52 (H) 15 - 41 U/L   ALT 29 0 - 44 U/L   Alkaline Phosphatase 127 (H) 38 - 126 U/L   Total Bilirubin 2.7 (H) 0.3 - 1.2 mg/dL   GFR, Estimated >60 >60 mL/min   Anion gap 5 5 - 15  Urinalysis, Routine w reflex microscopic -Urine, Clean Catch     Status: Abnormal   Collection Time: 07/08/22  2:32 AM  Result Value Ref Range   Color, Urine YELLOW YELLOW   APPearance CLEAR CLEAR   Specific Gravity, Urine 1.015 1.005 - 1.030   pH 6.0 5.0 - 8.0   Glucose, UA NEGATIVE NEGATIVE mg/dL   Hgb urine dipstick MODERATE (A) NEGATIVE   Bilirubin Urine NEGATIVE NEGATIVE   Ketones, ur NEGATIVE NEGATIVE mg/dL   Protein, ur NEGATIVE NEGATIVE mg/dL   Nitrite NEGATIVE NEGATIVE   Leukocytes,Ua NEGATIVE NEGATIVE   RBC / HPF 11-20 0 - 5 RBC/hpf   WBC, UA 0-5 0 - 5 WBC/hpf   Bacteria, UA NONE SEEN NONE SEEN   Squamous Epithelial / HPF 0-5 0 - 5 /HPF   Mucus PRESENT    *Note: Due to a large number of results and/or  encounters for the requested time period, some results have not been displayed. A complete set of results can be found in Results Review.   CTH: No acute intracranial abnormalities. Chronic atrophy and small vessel ischemic changes similar to prior study.  XR Right Hip: 1. Superior dislocation of the femoral component of the right hip arthroplasty. On the provided images, it is difficult to tell whether the femoral component lies anterior or posterior to the acetabular component. 2. No acute displaced fracture.  XR Right Hip: Status post right hip arthroplasty with successful relocation of the femoral component at the acetabulum. No obvious fracture or evidence of hardware loosening.   Assessment and Plan: Fall Right hip dislocation Generalized weakness     - placed in obs, med-surg     - s/p right hip relocation     - PT/OT eval  Pancytopenia     - at baseline, no evidence of bleed, trend  Chronic BLE edema/wounds     - WOCN  Chronic combined systolic/diastolic HF HTN     - continue home regimen when confirmed  Cirrhosis Portal HTN     - continue home regimen when confirmed  Acute metabolic encephalopathy Chronic pain     - AMS has largely resolved, he is a little sluggish/groggy during the interview     - no signs of infection     - limit opioids/mild altering meds today  COPD     - continue home regimen when confirmed  Advance Care Planning:   Code Status: FULL  Consults: None  Family Communication: None at bedside  Severity of Illness: The appropriate patient status for this patient is OBSERVATION. Observation status is judged to be reasonable and necessary in order to provide the required intensity of service to ensure the patient's safety. The patient's presenting symptoms, physical exam findings, and initial  radiographic and laboratory data in the context of their medical condition is felt to place them at decreased risk for further clinical  deterioration. Furthermore, it is anticipated that the patient will be medically stable for discharge from the hospital within 2 midnights of admission.   Author: Jonnie Finner, DO 07/08/2022 8:45 AM  For on call review [email or web address].

## 2022-07-09 DIAGNOSIS — G9340 Encephalopathy, unspecified: Secondary | ICD-10-CM | POA: Diagnosis not present

## 2022-07-09 LAB — CBC
HCT: 28.8 % — ABNORMAL LOW (ref 39.0–52.0)
Hemoglobin: 9.1 g/dL — ABNORMAL LOW (ref 13.0–17.0)
MCH: 35.1 pg — ABNORMAL HIGH (ref 26.0–34.0)
MCHC: 31.6 g/dL (ref 30.0–36.0)
MCV: 111.2 fL — ABNORMAL HIGH (ref 80.0–100.0)
Platelets: 44 10*3/uL — ABNORMAL LOW (ref 150–400)
RBC: 2.59 MIL/uL — ABNORMAL LOW (ref 4.22–5.81)
RDW: 17.4 % — ABNORMAL HIGH (ref 11.5–15.5)
WBC: 5.1 10*3/uL (ref 4.0–10.5)
nRBC: 0 % (ref 0.0–0.2)

## 2022-07-09 LAB — COMPREHENSIVE METABOLIC PANEL
ALT: 24 U/L (ref 0–44)
AST: 43 U/L — ABNORMAL HIGH (ref 15–41)
Albumin: 2 g/dL — ABNORMAL LOW (ref 3.5–5.0)
Alkaline Phosphatase: 96 U/L (ref 38–126)
Anion gap: 5 (ref 5–15)
BUN: 18 mg/dL (ref 8–23)
CO2: 26 mmol/L (ref 22–32)
Calcium: 7.8 mg/dL — ABNORMAL LOW (ref 8.9–10.3)
Chloride: 102 mmol/L (ref 98–111)
Creatinine, Ser: 0.97 mg/dL (ref 0.61–1.24)
GFR, Estimated: >60 mL/min (ref >60)
Glucose, Bld: 132 mg/dL — ABNORMAL HIGH (ref 70–99)
Potassium: 4.2 mmol/L (ref 3.5–5.1)
Sodium: 133 mmol/L — ABNORMAL LOW (ref 135–145)
Total Bilirubin: 2.5 mg/dL — ABNORMAL HIGH (ref 0.3–1.2)
Total Protein: 6.3 g/dL — ABNORMAL LOW (ref 6.5–8.1)

## 2022-07-09 MED ORDER — FENTANYL 75 MCG/HR TD PT72: 1.0000 | MEDICATED_PATCH | TRANSDERMAL | 0 refills | Status: DC

## 2022-07-09 MED ORDER — OXYCODONE HCL 5 MG PO TABS: 5.0000 mg | ORAL_TABLET | Freq: Three times a day (TID) | ORAL | 0 refills | Status: DC | PRN

## 2022-07-09 NOTE — TOC Transition Note (Addendum)
Transition of Care [facility]) - CM/SW Discharge Note   Patient Details  Name: [name] MRN: [id number] Date of Birth: 1955/08/04  Transition of Care [facility]) CM/SW Contact:  Henrietta Dine, RN Phone Number: 07/09/2022, 10:58 AM   Clinical Narrative:    Notified pt ready to return to [facility] today; spoke w/ Narda Rutherford at facility; she gave RM# [phone], call report # [phone]; pt notified; he says he and his wife are separated; pt will be transported by [facility]; pt agrees to d/c plan; awaiting d/c summary.  -1112- Janie notified d/c summary ready; she requested pt pick up by PT[facility] at 1300; [facility] called at 1113; spoke w/ operator [phone] and arranged 1300 pick up; snf transfer report and d/c summary sent via snf hub; no TOC needs.   Final next level of care: Skilled Nursing Facility Barriers to Discharge: No Barriers Identified   Patient Goals and CMS Choice CMS Medicare.gov Compare Post Acute Care list provided to:: Patient Choice offered to / list presented to : Patient  Discharge Placement                Patient chooses bed at: [facility] Patient to be transferred to facility by: Julian Name of family member notified: pt notified; pt says he and wife are seperated Patient and family notified of of transfer: 07/09/22  Discharge Plan and Services Additional resources added to the After Visit Summary for   In-house Referral: NA Discharge Planning Services: NA Post Acute Care Choice: Nursing Home, Resumption of Svcs/PTA Provider          DME Arranged: N/A DME Agency: NA                  Social Determinants of Health (SDOH) Interventions SDOH Screenings   Food Insecurity: No Food Insecurity (04/15/2022)  Housing: [place]  (07/08/2022)  Transportation Needs: No Transportation Needs (04/15/2022)  Utilities: Not At Risk (04/15/2022)  Depression (PHQ2-9): High Risk (10/04/2021)  Financial Resource Strain: High Risk (12/27/2020)  Physical Activity: Unknown  (02/20/2018)  Social Connections: Unknown (02/20/2018)  Tobacco Use: High Risk (07/07/2022)     Readmission Risk Interventions     No data to display

## 2022-07-09 NOTE — Plan of Care (Signed)

## 2022-07-09 NOTE — Discharge Instructions (Signed)
[name],  You were in the hospital because of a hip dislocation. This was reduced, but you will need to use a knee immobilizer when ambulating and follow-up with your orthopedic surgeon. You also had some confusion which appears to have been related to anesthesia.

## 2022-07-09 NOTE — Hospital Course (Signed)
[name] is a 67 y.o. male with a history of cirrhosis, portal hypertension, COPD, chronic pain, anemia, chronic combined heart failure, chronic thrombocytopenia. Patient presented after a fall and resultant right hip dislocation. Dislocation reduced the the emergency department, but patient developed some confusion and was admitted. Patient's mental status improved and was likely related to anesthesia for reduction procedure.

## 2022-07-09 NOTE — Discharge Summary (Signed)
Physician Discharge Summary   Patient: [name] MRN: [id number] DOB: 06-07-1955  Admit date:     07/07/2022  Discharge date: 07/09/22  Discharge Physician: Cordelia Poche, MD   PCP: Ria Bush, MD   Recommendations at discharge:  PCP visit for hospital follow-up Orthopedic surgery follow-up for hip dislocation Physical/Occupational therapy Right lower extremity: Weight bearing as tolerated with right knee immobilizer  Discharge Diagnoses: Principal Problem:   Encephalopathy Active Problems:   Essential hypertension   Cirrhosis of liver not due to alcohol ([place])   Chronic combined systolic and diastolic heart failure ([place])   COPD (chronic obstructive pulmonary disease) ([place])   Chronic pain syndrome   Generalized weakness   Pancytopenia ([place])   Fall at home, initial encounter   Edema   Portal hypertension ([place])   Closed dislocation of right hip ([place])   Falls  Resolved Problems:   * No resolved hospital problems. *  Hospital Course: [name] is a 67 y.o. male with a history of cirrhosis, portal hypertension, COPD, chronic pain, anemia, chronic combined heart failure, chronic thrombocytopenia. Patient presented after a fall and resultant right hip dislocation. Dislocation reduced the the emergency department, but patient developed some confusion and was admitted. Patient's mental status improved and was likely related to anesthesia for reduction procedure.  Assessment and Plan:  Right hip dislocation Patient with history of total hip arthroplasty. Hip dislocated secondary to slip and fall. Successfully reduced in the emergency department on 07/07/22. Recommendation for weight bearing as tolerated with right knee immobilizer and outpatient orthopedic surgery follow-up.  Anemia Chronic. Stable. No bleeding.  Thrombocytopenia Chronic. Stable. Likely related to liver disease  Chronic bilateral leg wounds Wound care consulted. Wound care  recommendations: Wound care to left pretibial blister (intact and if ruptures): Cleanse with NS, pat dry.Cover with folded layers of xeroform gauze, top with dry gauze and secure with silicone foam dressing. Perform twice weekly on Sundays and Wednesdays.   Acute toxic encephalopathy Likely not metabolic etiology. Most likely toxic from anesthesia. Resolved.  Chronic pain Portal hypertension COPD Chronic combined systolic/diastolic heart failure Primary hypertension Cirrhosis History of hepatic encephalopathy No changes to medications. Continue home regimen  Consultants: Orthopedic surgery (curbside) Procedures performed: Right hip reduction (2/2)  Disposition: Long term care facility Diet recommendation: Low sodium diet   DISCHARGE MEDICATION: Allergies as of 07/09/2022       Reactions   Statins Shortness Of Breath   Cough, trouble breathing Cough, trouble breathing   Losartan Other (See Comments)   Causes him to have pain   Wellbutrin [bupropion] Other (See Comments)   Worsened mood - crying   Allopurinol Nausea Only   Baclofen Nausea And Vomiting   Penicillins Nausea And Vomiting   Did it involve swelling of the face/tongue/throat, SOB, or low BP? N/A Did it involve sudden or severe rash/hives, skin peeling, or any reaction on the inside of your mouth or nose? N/A Did you need to seek medical attention at a hospital or doctor's office? N/A When did it last happen? Child     If all above answers are "NO", may proceed with cephalosporin use.   Tramadol Nausea Only        Medication List     STOP taking these medications    nystatin cream Commonly known as: MYCOSTATIN       TAKE these medications    albuterol (2.5 MG/3ML) 0.083% nebulizer solution Commonly known as: PROVENTIL USE 1 VIAL PER NEBULIZER EVERY 6 HRS AS  NEEDED FOR WHEEZING What changed:  how much to take how to take this when to take this reasons to take this additional instructions    albuterol 108 (90 Base) MCG/ACT inhaler Commonly known as: VENTOLIN HFA TAKE 2 PUFFS BY MOUTH EVERY 6 HOURS AS NEEDED FOR WHEEZE OR SHORTNESS OF BREATH What changed: See the new instructions.   cyanocobalamin 500 MCG tablet Commonly known as: VITAMIN B12 Take 1 tablet (500 mcg total) by mouth every Monday, Wednesday, and Friday.   fentaNYL 75 MCG/HR Commonly known as: [place] 1 patch onto the skin every 3 (three) days. Start taking on: July 12, 2022   ferrous sulfate 325 (65 FE) MG tablet Take 1 tablet (325 mg total) by mouth every other day.   folic acid 1 MG tablet Commonly known as: FOLVITE TAKE 1 TABLET BY MOUTH EVERY DAY   furosemide 40 MG tablet Commonly known as: LASIX Take 2 tablets (80 mg total) by mouth daily.   gabapentin 300 MG capsule Commonly known as: NEURONTIN Take 1 capsule (300 mg total) by mouth in the morning and at bedtime.   Lactulose 20 GM/30ML Soln Take 30 mLs (20 g total) by mouth in the morning, at noon, in the evening, and at bedtime.   nicotine 14 mg/24hr patch Commonly known as: NICODERM CQ - dosed in mg/24 hours APPLY 1 PATCH ONTO THE SKIN EVERY DAY   nitroGLYCERIN 0.4 MG SL tablet Commonly known as: NITROSTAT Place 1 tablet (0.4 mg total) under the tongue every 5 (five) minutes as needed for chest pain.   omeprazole 40 MG capsule Commonly known as: PRILOSEC TAKE 1 CAPSULE BY MOUTH EVERY DAY What changed: how much to take   ondansetron 4 MG tablet Commonly known as: ZOFRAN TAKE 1 TABLET BY MOUTH EVERY 8 HOURS AS NEEDED FOR NAUSEA AND VOMITING   oxyCODONE 5 MG immediate release tablet Commonly known as: Oxy IR/ROXICODONE Take 1 tablet (5 mg total) by mouth every 8 (eight) hours as needed for severe pain.   Spiriva Respimat 2.5 MCG/ACT Aers Generic drug: Tiotropium Bromide Monohydrate Inhale 2 puffs into the lungs daily.   spironolactone 100 MG tablet Commonly known as: ALDACTONE Take 0.5 tablets (50 mg total) by  mouth daily.   Tart [place] Advanced Caps Take 1 capsule by mouth 2 (two) times daily.   Xifaxan 550 MG Tabs tablet Generic drug: rifaximin Take 550 mg by mouth 2 (two) times daily.               Discharge Care Instructions  (From admission, onward)           Start     Ordered   07/09/22 0000  Discharge wound care:       Comments: Wound care to left pretibial blister (intact and if ruptures): Cleanse with NS, pat dry.Cover with folded layers of xeroform gauze, top with dry gauze and secure with silicone foam dressing. Perform twice weekly on Sundays and Wednesdays.   07/09/22 1048            Follow-up Information     Ria Bush, MD. Schedule an appointment as soon as possible for a visit in 1 week(s).   Specialty: Family Medicine Why: For hospital follow-up Contact information: [place] [address] [phone]         Rosanne Ashing, MD Follow up in 1 week(s).   Specialty: Orthopedic Surgery Why: Hip dislocation Contact information: [address] [place] [address] [phone]  Discharge Exam: BP 131/62 (BP Location: Left Arm)   Pulse 89   Temp 98.7 F (37.1 C)   Resp 20   Ht '5\' 9"'$  (1.753 m)   Wt (!) 143.8 kg   SpO2 91%   BMI 46.82 kg/m   General exam: Appears calm and comfortable Respiratory system: Clear to auscultation. Respiratory effort normal. Cardiovascular system: S1 & S2 heard, RRR. No murmurs. Central nervous system: Alert and oriented. Psychiatry: Judgement and insight appear normal. Mood & affect appropriate.   Condition at discharge: stable  The results of significant diagnostics from this hospitalization (including imaging, microbiology, ancillary and laboratory) are listed below for reference.   Imaging Studies: CT Head Wo Contrast  Result Date: 07/07/2022 CLINICAL DATA:  Mental status change of unknown cause. Slip and fall injury. EXAM: CT HEAD WITHOUT CONTRAST  TECHNIQUE: Contiguous axial images were obtained from the base of the skull through the vertex without intravenous contrast. RADIATION DOSE REDUCTION: This exam was performed according to the departmental dose-optimization program which includes automated exposure control, adjustment of the mA and/or kV according to patient size and/or use of iterative reconstruction technique. COMPARISON:  05/03/2022 FINDINGS: Brain: Diffuse cerebral atrophy. Ventricular dilatation consistent with central atrophy. Low-attenuation changes in the deep white matter consistent with small vessel ischemia. No abnormal extra-axial fluid collections. No mass effect or midline shift. Gray-white matter junctions are distinct. Basal cisterns are not effaced. No acute intracranial hemorrhage. Vascular: No hyperdense vessel or unexpected calcification. Skull: Calvarium appears intact. Sinuses/Orbits: Paranasal sinuses and mastoid air cells are clear. Other: None. IMPRESSION: No acute intracranial abnormalities. Chronic atrophy and small vessel ischemic changes similar to prior study. Electronically Signed   By: Lucienne Capers M.D.   On: 07/07/2022 23:52   DG Hip [place] or [place] Pelvis 1 View Right  Result Date: 07/07/2022 CLINICAL DATA:  Status post right hip reduction. EXAM: DG HIP (WITH OR WITHOUT PELVIS) 1V PORT RIGHT COMPARISON:  07/07/2022. FINDINGS: Total hip arthroplasty changes are noted on the right. There has been successful relocation of the femoral component at the acetabular component. No obvious fracture or evidence of hardware loosening. There are mild degenerative changes at the left hip. An aortic endograft and common iliac artery stents are noted. IMPRESSION: Status post right hip arthroplasty with successful relocation of the femoral component at the acetabulum. No obvious fracture or evidence of hardware loosening. Electronically Signed   By: Brett Fairy M.D.   On: 07/07/2022 23:49   DG Hip Unilat W or Wo Pelvis  2-3 Views Right  Result Date: 07/07/2022 CLINICAL DATA:  [place], right hip pain EXAM: DG HIP (WITH OR WITHOUT PELVIS) 2-3V RIGHT COMPARISON:  02/05/2019 FINDINGS: Frontal view of the pelvis as well as frontal and frogleg lateral views of the right hip are obtained. There is superior dislocation of the femoral component of the right hip arthroplasty. On the provided images is difficult to tell whether the femoral component is posterior or anterior to the acetabular component. No evidence of acute displaced fracture. Soft tissues are unremarkable. IMPRESSION: 1. Superior dislocation of the femoral component of the right hip arthroplasty. On the provided images, it is difficult to tell whether the femoral component lies anterior or posterior to the acetabular component. 2. No acute displaced fracture. Electronically Signed   By: Randa Ngo M.D.   On: 07/07/2022 22:17    Microbiology: Results for orders placed or performed during the hospital encounter of 05/03/22  Resp Panel by RT-PCR (Flu A&B, Covid)  Anterior Nasal Swab     Status: Abnormal   Collection Time: 05/04/22  2:01 AM   Specimen: Anterior Nasal Swab  Result Value Ref Range Status   SARS Coronavirus 2 by RT PCR POSITIVE (A) NEGATIVE Final    Comment: (NOTE) SARS-CoV-2 target nucleic acids are DETECTED.  The SARS-CoV-2 RNA is generally detectable in upper respiratory specimens during the acute phase of infection. Positive results are indicative of the presence of the identified virus, but do not rule out bacterial infection or co-infection with other pathogens not detected by the test. Clinical correlation with patient history and other diagnostic information is necessary to determine patient infection status. The expected result is Negative.  Fact Sheet for Patients: EntrepreneurPulse.com.au  Fact Sheet for Healthcare Providers: IncredibleEmployment.be  This test is not yet approved or cleared by  the [place] FDA and  has been authorized for detection and/or diagnosis of SARS-CoV-2 by FDA under an Emergency Use Authorization (EUA).  This EUA will remain in effect (meaning this test can be used) for the duration of  the COVID-19 declaration under Section 564(b)(1) of the A ct, 21 U.S.C. section 360bbb-3(b)(1), unless the authorization is terminated or revoked sooner.     Influenza A by PCR NEGATIVE NEGATIVE Final   Influenza B by PCR NEGATIVE NEGATIVE Final    Comment: (NOTE) The Xpert Xpress SARS-CoV-2/FLU/RSV plus assay is intended as an aid in the diagnosis of influenza from Nasopharyngeal swab specimens and should not be used as a sole basis for treatment. Nasal washings and aspirates are unacceptable for Xpert Xpress SARS-CoV-2/FLU/RSV testing.  Fact Sheet for Patients: EntrepreneurPulse.com.au  Fact Sheet for Healthcare Providers: IncredibleEmployment.be  This test is not yet approved or cleared by the [place] FDA and has been authorized for detection and/or diagnosis of SARS-CoV-2 by FDA under an Emergency Use Authorization (EUA). This EUA will remain in effect (meaning this test can be used) for the duration of the COVID-19 declaration under Section 564(b)(1) of the Act, 21 U.S.C. section 360bbb-3(b)(1), unless the authorization is terminated or revoked.  Performed at [facility], [place] [address], [place], [place] [address]    *Note: Due to a large number of results and/or encounters for the requested time period, some results have not been displayed. A complete set of results can be found in Results Review.    Labs: CBC: Recent Labs  Lab 07/07/22 2215 07/08/22 1012 07/09/22 0317  WBC 6.0 4.7 5.1  NEUTROABS 5.2 3.7  --   HGB 9.8* 9.5* 9.1*  HCT 30.4* 30.2* 28.8*  MCV 109.0* 110.6* 111.2*  PLT 48* 44* 44*   Basic Metabolic Panel: Recent Labs  Lab 07/07/22 2215 07/08/22 1012 07/09/22 0317  NA  133* 136 133*  K 4.0 3.9 4.2  CL 103 103 102  CO2 '25 27 26  '$ GLUCOSE 129* 100* 132*  BUN '19 18 18  '$ CREATININE 1.08 0.86 0.97  CALCIUM 7.8* 7.9* 7.8*   Liver Function Tests: Recent Labs  Lab 07/07/22 2215 07/08/22 1012 07/09/22 0317  AST 52* 47* 43*  ALT '29 26 24  '$ ALKPHOS 127* 96 96  BILITOT 2.7* 3.5* 2.5*  PROT 7.6 6.7 6.3*  ALBUMIN 2.3* 1.9* 2.0*    Discharge time spent: 35 minutes.  Signed: Cordelia Poche, MD [facility] 07/09/2022

## 2022-07-09 NOTE — Progress Notes (Signed)
Orthopedic Tech Progress Note Patient Details:  [name] [birth year] [id number]  Ortho Devices Type of Ortho Device: Knee Immobilizer Ortho Device/Splint Location: RLE Ortho Device/Splint Interventions: Ordered      Declan Adamson L Paulette [place] 07/09/2022, 10:15 AM

## 2022-07-09 NOTE — Evaluation (Signed)
Occupational Therapy Evaluation Patient Details Name: [name] MRN: [id number] DOB: Oct 21, 1955 Today's Date: 07/09/2022   History of Present Illness Patient is a 67 y.o. male  presented with a fall, reports that he was getting up from his chair  when he slipped on something wet. He fell to the ground doing a split resulting in R hip dislocation, now s/p closed reduction R hip. PMH: cirrhosis, portal HTN, COPD, chronic pain, anemia, chronic combined HF, chronic thrombocytopenia.   Clinical Impression   Patient is a 67 year old male who presented for above. Patient reported living at home but staying at [facility]. Notes from [facility] report that patient is LTC at [facility]. Patient  was +2 for bed mobility for hygiene tasks with increased A to move RLE with pain with all movements. Patient pending d/c back to SNF at this time with PTAR on the way. Patient would continue to benefit from skilled OT services at this time while admitted and after d/c to address noted deficits in order to improve overall safety and independence in ADLs.       Recommendations for follow up therapy are one component of a multi-disciplinary discharge planning process, led by the attending physician.  Recommendations may be updated based on patient status, additional functional criteria and insurance authorization.   Follow Up Recommendations  Skilled nursing-short term rehab (<3 hours/day)     Assistance Recommended at Discharge Frequent or constant Supervision/Assistance  Patient can return home with the following Two people to help with walking and/or transfers;Two people to help with bathing/dressing/bathroom;Direct supervision/assist for financial management;Help with stairs or ramp for entrance;Assist for transportation;Direct supervision/assist for medications management;Assistance with cooking/housework    Functional Status Assessment  Patient has had a recent decline in their functional status and demonstrates the  ability to make significant improvements in function in a reasonable and predictable amount of time.  Equipment Recommendations  None recommended by OT    Recommendations for Other Services       Precautions / Restrictions Precautions Precautions: Fall Precaution Comments: WBAT with KI per Dr.Nettey secure chat 07/09/22 Required Braces or Orthoses: Knee Immobilizer - Right Knee Immobilizer - Right: On when out of bed or walking Restrictions Weight Bearing Restrictions: No      Mobility Bed Mobility Overal bed mobility: Needs Assistance Bed Mobility: Rolling Rolling: Max assist, +2 for physical assistance, +2 for safety/equipment                   ADL either performed or assessed with clinical judgement   ADL Overall ADL's : Needs assistance/impaired Eating/Feeding: Modified independent;Bed level   Grooming: Bed level;Set up   Upper Body Bathing: Bed level;Minimal assistance   Lower Body Bathing: Bed level;Total assistance   Upper Body Dressing : Bed level;Minimal assistance   Lower Body Dressing: Bed level;Total assistance     Toilet Transfer Details (indicate cue type and reason): patient was +2 for rolling in bed with patient calling out in pain for RLE movement at this time with PTAR pending. Toileting- Clothing Manipulation and Hygiene: Total assistance;Bed level               Vision   Vision Assessment?: No apparent visual deficits            Pertinent Vitals/Pain Pain Assessment Pain Assessment: Faces Faces Pain Scale: Hurts whole lot Pain Location: RLE Pain Descriptors / Indicators: Grimacing, Guarding, Moaning Pain Intervention(s): Limited activity within patient's tolerance, Monitored during session, Premedicated before session  Extremity/Trunk Assessment Upper Extremity Assessment Upper Extremity Assessment: Overall WFL for tasks assessed   Lower Extremity Assessment Lower Extremity Assessment: Defer to PT evaluation    Cervical / Trunk Assessment Cervical / Trunk Assessment: Other exceptions Cervical / Trunk Exceptions: bariatric patient   Communication Communication Communication: No difficulties   Cognition Arousal/Alertness: Awake/alert Behavior During Therapy: WFL for tasks assessed/performed Overall Cognitive Status: No family/caregiver present to determine baseline cognitive functioning         General Comments: cooperative and plesant                Home Living Family/patient expects to be discharged to:: Skilled nursing facility         Home Equipment: Rollator (4 wheels);Hospital bed;Electric scooter   Additional Comments: per SW/TOC pt is LTC resident at [facility]; pt states he is at a facility for ST rehab on [address]      Prior Functioning/Environment Prior Level of Function : Patient poor historian/Family not available               ADLs Comments: patient reported that he lives at SNF with no therapy at current time. patient reported getting out of bed each day with RW with staff.        OT Problem List: Decreased strength;Decreased activity tolerance;Impaired balance (sitting and/or standing);Decreased coordination;Decreased safety awareness;Decreased knowledge of precautions;Pain      OT Treatment/Interventions: Self-care/ADL training;Energy conservation;Therapeutic exercise;DME and/or AE instruction;Therapeutic activities;Balance training;Patient/family education    OT Goals(Current goals can be found in the care plan section) Acute Rehab OT Goals Patient Stated Goal: to go home OT Goal Formulation: With patient Time For Goal Achievement: 07/23/22 Potential to Achieve Goals: Fair  OT Frequency: Min 2X/week       AM-PAC OT "6 Clicks" Daily Activity     Outcome Measure Help from another person eating meals?: A Little Help from another person taking care of personal grooming?: A Little Help from another person toileting, which includes using toliet,  bedpan, or urinal?: Total Help from another person bathing (including washing, rinsing, drying)?: Total Help from another person to put on and taking off regular upper body clothing?: A Lot Help from another person to put on and taking off regular lower body clothing?: Total 6 Click Score: 11   End of Session Nurse Communication: Other (comment) (nurse present in room for session)  Activity Tolerance: Patient limited by pain Patient left: in bed;with call bell/phone within reach;with nursing/sitter in room  OT Visit Diagnosis: Other abnormalities of gait and mobility (R26.89);Muscle weakness (generalized) (M62.81);Unsteadiness on feet (R26.81);Pain Pain - Right/Left: Right Pain - part of body: Hip                Time: 8527-7824 OT Time Calculation (min): 21 min Charges:  OT General Charges $OT Visit: 1 Visit OT Evaluation $OT Eval Moderate Complexity: 1 Mod  Braun Rocca OTR/L, MS Acute Rehabilitation Department Office# [phone]   Willa Rough 07/09/2022, 12:52 PM

## 2022-07-10 DIAGNOSIS — D696 Thrombocytopenia, unspecified: Secondary | ICD-10-CM | POA: Diagnosis not present

## 2022-07-10 DIAGNOSIS — J449 Chronic obstructive pulmonary disease, unspecified: Secondary | ICD-10-CM | POA: Diagnosis not present

## 2022-07-10 DIAGNOSIS — K7460 Unspecified cirrhosis of liver: Secondary | ICD-10-CM | POA: Diagnosis not present

## 2022-07-10 DIAGNOSIS — S73004D Unspecified dislocation of right hip, subsequent encounter: Secondary | ICD-10-CM | POA: Diagnosis not present

## 2022-07-10 DIAGNOSIS — I110 Hypertensive heart disease with heart failure: Secondary | ICD-10-CM | POA: Diagnosis not present

## 2022-07-10 DIAGNOSIS — G894 Chronic pain syndrome: Secondary | ICD-10-CM | POA: Diagnosis not present

## 2022-07-10 DIAGNOSIS — I5042 Chronic combined systolic (congestive) and diastolic (congestive) heart failure: Secondary | ICD-10-CM | POA: Diagnosis not present

## 2022-07-12 DIAGNOSIS — I83891 Varicose veins of right lower extremities with other complications: Secondary | ICD-10-CM | POA: Diagnosis not present

## 2022-07-12 DIAGNOSIS — M6281 Muscle weakness (generalized): Secondary | ICD-10-CM | POA: Diagnosis not present

## 2022-07-12 DIAGNOSIS — L97929 Non-pressure chronic ulcer of unspecified part of left lower leg with unspecified severity: Secondary | ICD-10-CM | POA: Diagnosis not present

## 2022-07-12 DIAGNOSIS — I5042 Chronic combined systolic (congestive) and diastolic (congestive) heart failure: Secondary | ICD-10-CM | POA: Diagnosis not present

## 2022-07-12 DIAGNOSIS — S81802A Unspecified open wound, left lower leg, initial encounter: Secondary | ICD-10-CM | POA: Diagnosis not present

## 2022-07-12 DIAGNOSIS — I509 Heart failure, unspecified: Secondary | ICD-10-CM | POA: Diagnosis not present

## 2022-07-12 DIAGNOSIS — I25118 Atherosclerotic heart disease of native coronary artery with other forms of angina pectoris: Secondary | ICD-10-CM | POA: Diagnosis not present

## 2022-07-12 DIAGNOSIS — J449 Chronic obstructive pulmonary disease, unspecified: Secondary | ICD-10-CM | POA: Diagnosis not present

## 2022-07-12 DIAGNOSIS — D696 Thrombocytopenia, unspecified: Secondary | ICD-10-CM | POA: Diagnosis not present

## 2022-07-12 DIAGNOSIS — R262 Difficulty in walking, not elsewhere classified: Secondary | ICD-10-CM | POA: Diagnosis not present

## 2022-07-12 DIAGNOSIS — L97919 Non-pressure chronic ulcer of unspecified part of right lower leg with unspecified severity: Secondary | ICD-10-CM | POA: Diagnosis not present

## 2022-07-12 DIAGNOSIS — I83892 Varicose veins of left lower extremities with other complications: Secondary | ICD-10-CM | POA: Diagnosis not present

## 2022-07-12 DIAGNOSIS — I4729 Other ventricular tachycardia: Secondary | ICD-10-CM | POA: Diagnosis not present

## 2022-07-12 DIAGNOSIS — Z87891 Personal history of nicotine dependence: Secondary | ICD-10-CM | POA: Diagnosis not present

## 2022-07-12 DIAGNOSIS — I83029 Varicose veins of left lower extremity with ulcer of unspecified site: Secondary | ICD-10-CM | POA: Diagnosis not present

## 2022-07-12 DIAGNOSIS — I5033 Acute on chronic diastolic (congestive) heart failure: Secondary | ICD-10-CM | POA: Diagnosis not present

## 2022-07-12 DIAGNOSIS — I2582 Chronic total occlusion of coronary artery: Secondary | ICD-10-CM | POA: Diagnosis not present

## 2022-07-12 DIAGNOSIS — R161 Splenomegaly, not elsewhere classified: Secondary | ICD-10-CM | POA: Diagnosis not present

## 2022-07-12 DIAGNOSIS — R0602 Shortness of breath: Secondary | ICD-10-CM | POA: Diagnosis not present

## 2022-07-12 DIAGNOSIS — T148XXA Other injury of unspecified body region, initial encounter: Secondary | ICD-10-CM | POA: Diagnosis not present

## 2022-07-12 DIAGNOSIS — I252 Old myocardial infarction: Secondary | ICD-10-CM | POA: Diagnosis not present

## 2022-07-12 DIAGNOSIS — I472 Ventricular tachycardia, unspecified: Secondary | ICD-10-CM | POA: Diagnosis not present

## 2022-07-12 DIAGNOSIS — I959 Hypotension, unspecified: Secondary | ICD-10-CM | POA: Diagnosis not present

## 2022-07-12 DIAGNOSIS — I110 Hypertensive heart disease with heart failure: Secondary | ICD-10-CM | POA: Diagnosis not present

## 2022-07-12 DIAGNOSIS — Z955 Presence of coronary angioplasty implant and graft: Secondary | ICD-10-CM | POA: Diagnosis not present

## 2022-07-12 DIAGNOSIS — R9431 Abnormal electrocardiogram [ECG] [EKG]: Secondary | ICD-10-CM | POA: Diagnosis not present

## 2022-07-12 DIAGNOSIS — Z95820 Peripheral vascular angioplasty status with implants and grafts: Secondary | ICD-10-CM | POA: Diagnosis not present

## 2022-07-12 DIAGNOSIS — I83019 Varicose veins of right lower extremity with ulcer of unspecified site: Secondary | ICD-10-CM | POA: Diagnosis not present

## 2022-07-12 DIAGNOSIS — R293 Abnormal posture: Secondary | ICD-10-CM | POA: Diagnosis not present

## 2022-07-12 DIAGNOSIS — I2584 Coronary atherosclerosis due to calcified coronary lesion: Secondary | ICD-10-CM | POA: Diagnosis not present

## 2022-07-12 DIAGNOSIS — R0989 Other specified symptoms and signs involving the circulatory and respiratory systems: Secondary | ICD-10-CM | POA: Diagnosis not present

## 2022-07-12 DIAGNOSIS — R079 Chest pain, unspecified: Secondary | ICD-10-CM | POA: Diagnosis not present

## 2022-07-12 DIAGNOSIS — I25119 Atherosclerotic heart disease of native coronary artery with unspecified angina pectoris: Secondary | ICD-10-CM | POA: Diagnosis not present

## 2022-07-12 DIAGNOSIS — G8929 Other chronic pain: Secondary | ICD-10-CM | POA: Diagnosis not present

## 2022-07-12 DIAGNOSIS — E441 Mild protein-calorie malnutrition: Secondary | ICD-10-CM | POA: Diagnosis not present

## 2022-07-12 DIAGNOSIS — I2510 Atherosclerotic heart disease of native coronary artery without angina pectoris: Secondary | ICD-10-CM | POA: Diagnosis not present

## 2022-07-12 DIAGNOSIS — I83018 Varicose veins of right lower extremity with ulcer other part of lower leg: Secondary | ICD-10-CM | POA: Diagnosis not present

## 2022-07-12 DIAGNOSIS — I5022 Chronic systolic (congestive) heart failure: Secondary | ICD-10-CM | POA: Diagnosis not present

## 2022-07-12 DIAGNOSIS — Z79899 Other long term (current) drug therapy: Secondary | ICD-10-CM | POA: Diagnosis not present

## 2022-07-12 DIAGNOSIS — T82855A Stenosis of coronary artery stent, initial encounter: Secondary | ICD-10-CM | POA: Diagnosis not present

## 2022-07-12 DIAGNOSIS — R278 Other lack of coordination: Secondary | ICD-10-CM | POA: Diagnosis not present

## 2022-07-12 DIAGNOSIS — I10 Essential (primary) hypertension: Secondary | ICD-10-CM | POA: Diagnosis not present

## 2022-07-12 DIAGNOSIS — G9341 Metabolic encephalopathy: Secondary | ICD-10-CM | POA: Diagnosis not present

## 2022-07-12 DIAGNOSIS — Z4789 Encounter for other orthopedic aftercare: Secondary | ICD-10-CM | POA: Diagnosis not present

## 2022-07-12 DIAGNOSIS — Z95818 Presence of other cardiac implants and grafts: Secondary | ICD-10-CM | POA: Diagnosis not present

## 2022-07-12 DIAGNOSIS — I999 Unspecified disorder of circulatory system: Secondary | ICD-10-CM | POA: Diagnosis not present

## 2022-07-12 DIAGNOSIS — I214 Non-ST elevation (NSTEMI) myocardial infarction: Secondary | ICD-10-CM | POA: Diagnosis not present

## 2022-07-12 DIAGNOSIS — K766 Portal hypertension: Secondary | ICD-10-CM | POA: Diagnosis not present

## 2022-07-12 DIAGNOSIS — R600 Localized edema: Secondary | ICD-10-CM | POA: Diagnosis not present

## 2022-07-12 DIAGNOSIS — I255 Ischemic cardiomyopathy: Secondary | ICD-10-CM | POA: Diagnosis not present

## 2022-07-12 DIAGNOSIS — I5023 Acute on chronic systolic (congestive) heart failure: Secondary | ICD-10-CM | POA: Diagnosis not present

## 2022-07-12 DIAGNOSIS — R2681 Unsteadiness on feet: Secondary | ICD-10-CM | POA: Diagnosis not present

## 2022-07-12 DIAGNOSIS — K7581 Nonalcoholic steatohepatitis (NASH): Secondary | ICD-10-CM | POA: Diagnosis not present

## 2022-07-12 DIAGNOSIS — J810 Acute pulmonary edema: Secondary | ICD-10-CM | POA: Diagnosis not present

## 2022-07-12 DIAGNOSIS — D649 Anemia, unspecified: Secondary | ICD-10-CM | POA: Diagnosis not present

## 2022-07-12 DIAGNOSIS — K7460 Unspecified cirrhosis of liver: Secondary | ICD-10-CM | POA: Diagnosis not present

## 2022-07-12 DIAGNOSIS — I2541 Coronary artery aneurysm: Secondary | ICD-10-CM | POA: Diagnosis not present

## 2022-07-12 DIAGNOSIS — Z95810 Presence of automatic (implantable) cardiac defibrillator: Secondary | ICD-10-CM | POA: Diagnosis not present

## 2022-07-12 DIAGNOSIS — Z95811 Presence of heart assist device: Secondary | ICD-10-CM | POA: Diagnosis not present

## 2022-07-12 DIAGNOSIS — D6859 Other primary thrombophilia: Secondary | ICD-10-CM | POA: Diagnosis not present

## 2022-07-12 DIAGNOSIS — I4719 Other supraventricular tachycardia: Secondary | ICD-10-CM | POA: Diagnosis not present

## 2022-07-12 DIAGNOSIS — J9811 Atelectasis: Secondary | ICD-10-CM | POA: Diagnosis not present

## 2022-07-12 DIAGNOSIS — S81801A Unspecified open wound, right lower leg, initial encounter: Secondary | ICD-10-CM | POA: Diagnosis not present

## 2022-07-12 DIAGNOSIS — R000 Tachycardia, unspecified: Secondary | ICD-10-CM | POA: Diagnosis not present

## 2022-07-13 DIAGNOSIS — K7460 Unspecified cirrhosis of liver: Secondary | ICD-10-CM | POA: Diagnosis not present

## 2022-07-13 DIAGNOSIS — I5022 Chronic systolic (congestive) heart failure: Secondary | ICD-10-CM | POA: Diagnosis not present

## 2022-07-13 DIAGNOSIS — I5042 Chronic combined systolic (congestive) and diastolic (congestive) heart failure: Secondary | ICD-10-CM | POA: Diagnosis not present

## 2022-07-13 DIAGNOSIS — K766 Portal hypertension: Secondary | ICD-10-CM | POA: Diagnosis not present

## 2022-07-13 DIAGNOSIS — R000 Tachycardia, unspecified: Secondary | ICD-10-CM | POA: Diagnosis not present

## 2022-07-13 DIAGNOSIS — D696 Thrombocytopenia, unspecified: Secondary | ICD-10-CM | POA: Diagnosis not present

## 2022-07-13 DIAGNOSIS — J449 Chronic obstructive pulmonary disease, unspecified: Secondary | ICD-10-CM | POA: Diagnosis not present

## 2022-07-13 DIAGNOSIS — Z79899 Other long term (current) drug therapy: Secondary | ICD-10-CM | POA: Diagnosis not present

## 2022-07-13 DIAGNOSIS — I255 Ischemic cardiomyopathy: Secondary | ICD-10-CM | POA: Diagnosis not present

## 2022-07-13 DIAGNOSIS — I25119 Atherosclerotic heart disease of native coronary artery with unspecified angina pectoris: Secondary | ICD-10-CM | POA: Diagnosis not present

## 2022-07-14 DIAGNOSIS — T82855A Stenosis of coronary artery stent, initial encounter: Secondary | ICD-10-CM | POA: Diagnosis not present

## 2022-07-14 DIAGNOSIS — J449 Chronic obstructive pulmonary disease, unspecified: Secondary | ICD-10-CM | POA: Diagnosis not present

## 2022-07-14 DIAGNOSIS — I4729 Other ventricular tachycardia: Secondary | ICD-10-CM | POA: Diagnosis not present

## 2022-07-14 DIAGNOSIS — I472 Ventricular tachycardia, unspecified: Secondary | ICD-10-CM | POA: Diagnosis not present

## 2022-07-14 DIAGNOSIS — I5022 Chronic systolic (congestive) heart failure: Secondary | ICD-10-CM | POA: Diagnosis not present

## 2022-07-14 DIAGNOSIS — I255 Ischemic cardiomyopathy: Secondary | ICD-10-CM | POA: Diagnosis not present

## 2022-07-14 DIAGNOSIS — D696 Thrombocytopenia, unspecified: Secondary | ICD-10-CM | POA: Diagnosis not present

## 2022-07-14 DIAGNOSIS — K7460 Unspecified cirrhosis of liver: Secondary | ICD-10-CM | POA: Diagnosis not present

## 2022-07-14 DIAGNOSIS — I2582 Chronic total occlusion of coronary artery: Secondary | ICD-10-CM | POA: Diagnosis not present

## 2022-07-14 DIAGNOSIS — Z79899 Other long term (current) drug therapy: Secondary | ICD-10-CM | POA: Diagnosis not present

## 2022-07-14 DIAGNOSIS — I2510 Atherosclerotic heart disease of native coronary artery without angina pectoris: Secondary | ICD-10-CM | POA: Diagnosis not present

## 2022-07-14 DIAGNOSIS — K766 Portal hypertension: Secondary | ICD-10-CM | POA: Diagnosis not present

## 2022-07-15 DIAGNOSIS — I10 Essential (primary) hypertension: Secondary | ICD-10-CM | POA: Diagnosis not present

## 2022-07-15 DIAGNOSIS — Z955 Presence of coronary angioplasty implant and graft: Secondary | ICD-10-CM | POA: Diagnosis not present

## 2022-07-15 DIAGNOSIS — Z79899 Other long term (current) drug therapy: Secondary | ICD-10-CM | POA: Diagnosis not present

## 2022-07-15 DIAGNOSIS — J449 Chronic obstructive pulmonary disease, unspecified: Secondary | ICD-10-CM | POA: Diagnosis not present

## 2022-07-15 DIAGNOSIS — K766 Portal hypertension: Secondary | ICD-10-CM | POA: Diagnosis not present

## 2022-07-15 DIAGNOSIS — I2510 Atherosclerotic heart disease of native coronary artery without angina pectoris: Secondary | ICD-10-CM | POA: Diagnosis not present

## 2022-07-15 DIAGNOSIS — I959 Hypotension, unspecified: Secondary | ICD-10-CM | POA: Diagnosis not present

## 2022-07-15 DIAGNOSIS — K7460 Unspecified cirrhosis of liver: Secondary | ICD-10-CM | POA: Diagnosis not present

## 2022-07-15 DIAGNOSIS — I4729 Other ventricular tachycardia: Secondary | ICD-10-CM | POA: Diagnosis not present

## 2022-07-15 DIAGNOSIS — I255 Ischemic cardiomyopathy: Secondary | ICD-10-CM | POA: Diagnosis not present

## 2022-07-15 DIAGNOSIS — R000 Tachycardia, unspecified: Secondary | ICD-10-CM | POA: Diagnosis not present

## 2022-07-16 DIAGNOSIS — K7460 Unspecified cirrhosis of liver: Secondary | ICD-10-CM | POA: Diagnosis not present

## 2022-07-16 DIAGNOSIS — G8929 Other chronic pain: Secondary | ICD-10-CM | POA: Diagnosis not present

## 2022-07-16 DIAGNOSIS — T148XXA Other injury of unspecified body region, initial encounter: Secondary | ICD-10-CM | POA: Diagnosis not present

## 2022-07-16 DIAGNOSIS — Z95818 Presence of other cardiac implants and grafts: Secondary | ICD-10-CM | POA: Diagnosis not present

## 2022-07-16 DIAGNOSIS — I2510 Atherosclerotic heart disease of native coronary artery without angina pectoris: Secondary | ICD-10-CM | POA: Diagnosis not present

## 2022-07-16 DIAGNOSIS — D696 Thrombocytopenia, unspecified: Secondary | ICD-10-CM | POA: Diagnosis not present

## 2022-07-16 DIAGNOSIS — J449 Chronic obstructive pulmonary disease, unspecified: Secondary | ICD-10-CM | POA: Diagnosis not present

## 2022-07-16 DIAGNOSIS — I4729 Other ventricular tachycardia: Secondary | ICD-10-CM | POA: Diagnosis not present

## 2022-07-16 DIAGNOSIS — I5023 Acute on chronic systolic (congestive) heart failure: Secondary | ICD-10-CM | POA: Diagnosis not present

## 2022-07-17 DIAGNOSIS — I4729 Other ventricular tachycardia: Secondary | ICD-10-CM | POA: Diagnosis not present

## 2022-07-17 DIAGNOSIS — I2510 Atherosclerotic heart disease of native coronary artery without angina pectoris: Secondary | ICD-10-CM | POA: Diagnosis not present

## 2022-07-17 DIAGNOSIS — S81801A Unspecified open wound, right lower leg, initial encounter: Secondary | ICD-10-CM | POA: Diagnosis not present

## 2022-07-17 DIAGNOSIS — L97919 Non-pressure chronic ulcer of unspecified part of right lower leg with unspecified severity: Secondary | ICD-10-CM | POA: Diagnosis not present

## 2022-07-17 DIAGNOSIS — D696 Thrombocytopenia, unspecified: Secondary | ICD-10-CM | POA: Diagnosis not present

## 2022-07-17 DIAGNOSIS — I83892 Varicose veins of left lower extremities with other complications: Secondary | ICD-10-CM | POA: Diagnosis not present

## 2022-07-17 DIAGNOSIS — R600 Localized edema: Secondary | ICD-10-CM | POA: Diagnosis not present

## 2022-07-17 DIAGNOSIS — I5023 Acute on chronic systolic (congestive) heart failure: Secondary | ICD-10-CM | POA: Diagnosis not present

## 2022-07-17 DIAGNOSIS — I83029 Varicose veins of left lower extremity with ulcer of unspecified site: Secondary | ICD-10-CM | POA: Diagnosis not present

## 2022-07-17 DIAGNOSIS — S81802A Unspecified open wound, left lower leg, initial encounter: Secondary | ICD-10-CM | POA: Diagnosis not present

## 2022-07-17 DIAGNOSIS — E441 Mild protein-calorie malnutrition: Secondary | ICD-10-CM | POA: Diagnosis not present

## 2022-07-17 DIAGNOSIS — I83019 Varicose veins of right lower extremity with ulcer of unspecified site: Secondary | ICD-10-CM | POA: Diagnosis not present

## 2022-07-17 DIAGNOSIS — G8929 Other chronic pain: Secondary | ICD-10-CM | POA: Diagnosis not present

## 2022-07-17 DIAGNOSIS — J449 Chronic obstructive pulmonary disease, unspecified: Secondary | ICD-10-CM | POA: Diagnosis not present

## 2022-07-17 DIAGNOSIS — R000 Tachycardia, unspecified: Secondary | ICD-10-CM | POA: Diagnosis not present

## 2022-07-17 DIAGNOSIS — K7460 Unspecified cirrhosis of liver: Secondary | ICD-10-CM | POA: Diagnosis not present

## 2022-07-17 DIAGNOSIS — R0602 Shortness of breath: Secondary | ICD-10-CM | POA: Diagnosis not present

## 2022-07-17 DIAGNOSIS — I83891 Varicose veins of right lower extremities with other complications: Secondary | ICD-10-CM | POA: Diagnosis not present

## 2022-07-17 DIAGNOSIS — L97929 Non-pressure chronic ulcer of unspecified part of left lower leg with unspecified severity: Secondary | ICD-10-CM | POA: Diagnosis not present

## 2022-07-18 DIAGNOSIS — J449 Chronic obstructive pulmonary disease, unspecified: Secondary | ICD-10-CM | POA: Diagnosis not present

## 2022-07-18 DIAGNOSIS — I5023 Acute on chronic systolic (congestive) heart failure: Secondary | ICD-10-CM | POA: Diagnosis not present

## 2022-07-18 DIAGNOSIS — I2510 Atherosclerotic heart disease of native coronary artery without angina pectoris: Secondary | ICD-10-CM | POA: Diagnosis not present

## 2022-07-18 DIAGNOSIS — D696 Thrombocytopenia, unspecified: Secondary | ICD-10-CM | POA: Diagnosis not present

## 2022-07-18 DIAGNOSIS — G8929 Other chronic pain: Secondary | ICD-10-CM | POA: Diagnosis not present

## 2022-07-18 DIAGNOSIS — S81802A Unspecified open wound, left lower leg, initial encounter: Secondary | ICD-10-CM | POA: Diagnosis not present

## 2022-07-18 DIAGNOSIS — I4729 Other ventricular tachycardia: Secondary | ICD-10-CM | POA: Diagnosis not present

## 2022-07-18 DIAGNOSIS — K7460 Unspecified cirrhosis of liver: Secondary | ICD-10-CM | POA: Diagnosis not present

## 2022-07-18 DIAGNOSIS — S81801A Unspecified open wound, right lower leg, initial encounter: Secondary | ICD-10-CM | POA: Diagnosis not present

## 2022-07-19 DIAGNOSIS — S81802A Unspecified open wound, left lower leg, initial encounter: Secondary | ICD-10-CM | POA: Diagnosis not present

## 2022-07-19 DIAGNOSIS — I5023 Acute on chronic systolic (congestive) heart failure: Secondary | ICD-10-CM | POA: Diagnosis not present

## 2022-07-19 DIAGNOSIS — I4729 Other ventricular tachycardia: Secondary | ICD-10-CM | POA: Diagnosis not present

## 2022-07-19 DIAGNOSIS — G8929 Other chronic pain: Secondary | ICD-10-CM | POA: Diagnosis not present

## 2022-07-19 DIAGNOSIS — D696 Thrombocytopenia, unspecified: Secondary | ICD-10-CM | POA: Diagnosis not present

## 2022-07-19 DIAGNOSIS — I2510 Atherosclerotic heart disease of native coronary artery without angina pectoris: Secondary | ICD-10-CM | POA: Diagnosis not present

## 2022-07-19 DIAGNOSIS — R9431 Abnormal electrocardiogram [ECG] [EKG]: Secondary | ICD-10-CM | POA: Diagnosis not present

## 2022-07-19 DIAGNOSIS — S81801A Unspecified open wound, right lower leg, initial encounter: Secondary | ICD-10-CM | POA: Diagnosis not present

## 2022-07-19 DIAGNOSIS — J449 Chronic obstructive pulmonary disease, unspecified: Secondary | ICD-10-CM | POA: Diagnosis not present

## 2022-07-19 DIAGNOSIS — K7460 Unspecified cirrhosis of liver: Secondary | ICD-10-CM | POA: Diagnosis not present

## 2022-07-25 DIAGNOSIS — E559 Vitamin D deficiency, unspecified: Secondary | ICD-10-CM | POA: Diagnosis not present

## 2022-07-25 DIAGNOSIS — I10 Essential (primary) hypertension: Secondary | ICD-10-CM | POA: Diagnosis not present

## 2022-07-25 DIAGNOSIS — K7581 Nonalcoholic steatohepatitis (NASH): Secondary | ICD-10-CM | POA: Diagnosis not present

## 2022-07-25 DIAGNOSIS — R262 Difficulty in walking, not elsewhere classified: Secondary | ICD-10-CM | POA: Diagnosis not present

## 2022-07-25 DIAGNOSIS — K766 Portal hypertension: Secondary | ICD-10-CM | POA: Diagnosis not present

## 2022-07-25 DIAGNOSIS — D638 Anemia in other chronic diseases classified elsewhere: Secondary | ICD-10-CM | POA: Diagnosis not present

## 2022-07-25 DIAGNOSIS — M6281 Muscle weakness (generalized): Secondary | ICD-10-CM | POA: Diagnosis not present

## 2022-07-25 DIAGNOSIS — I5023 Acute on chronic systolic (congestive) heart failure: Secondary | ICD-10-CM | POA: Diagnosis not present

## 2022-07-25 DIAGNOSIS — J449 Chronic obstructive pulmonary disease, unspecified: Secondary | ICD-10-CM | POA: Diagnosis not present

## 2022-07-25 DIAGNOSIS — D696 Thrombocytopenia, unspecified: Secondary | ICD-10-CM | POA: Diagnosis not present

## 2022-07-25 DIAGNOSIS — R293 Abnormal posture: Secondary | ICD-10-CM | POA: Diagnosis not present

## 2022-07-25 DIAGNOSIS — G894 Chronic pain syndrome: Secondary | ICD-10-CM | POA: Diagnosis not present

## 2022-07-25 DIAGNOSIS — I499 Cardiac arrhythmia, unspecified: Secondary | ICD-10-CM | POA: Diagnosis not present

## 2022-07-25 DIAGNOSIS — E119 Type 2 diabetes mellitus without complications: Secondary | ICD-10-CM | POA: Diagnosis not present

## 2022-07-25 DIAGNOSIS — R2681 Unsteadiness on feet: Secondary | ICD-10-CM | POA: Diagnosis not present

## 2022-07-25 DIAGNOSIS — K7460 Unspecified cirrhosis of liver: Secondary | ICD-10-CM | POA: Diagnosis not present

## 2022-07-25 DIAGNOSIS — I2510 Atherosclerotic heart disease of native coronary artery without angina pectoris: Secondary | ICD-10-CM | POA: Diagnosis not present

## 2022-07-25 DIAGNOSIS — F332 Major depressive disorder, recurrent severe without psychotic features: Secondary | ICD-10-CM | POA: Diagnosis not present

## 2022-07-25 DIAGNOSIS — I255 Ischemic cardiomyopathy: Secondary | ICD-10-CM | POA: Diagnosis not present

## 2022-07-25 DIAGNOSIS — R278 Other lack of coordination: Secondary | ICD-10-CM | POA: Diagnosis not present

## 2022-07-25 DIAGNOSIS — I4729 Other ventricular tachycardia: Secondary | ICD-10-CM | POA: Diagnosis not present

## 2022-07-25 DIAGNOSIS — G9341 Metabolic encephalopathy: Secondary | ICD-10-CM | POA: Diagnosis not present

## 2022-07-25 DIAGNOSIS — D649 Anemia, unspecified: Secondary | ICD-10-CM | POA: Diagnosis not present

## 2022-07-25 DIAGNOSIS — I5042 Chronic combined systolic (congestive) and diastolic (congestive) heart failure: Secondary | ICD-10-CM | POA: Diagnosis not present

## 2022-07-25 DIAGNOSIS — Z95810 Presence of automatic (implantable) cardiac defibrillator: Secondary | ICD-10-CM | POA: Diagnosis not present

## 2022-07-25 DIAGNOSIS — Z95818 Presence of other cardiac implants and grafts: Secondary | ICD-10-CM | POA: Diagnosis not present

## 2022-07-25 DIAGNOSIS — I472 Ventricular tachycardia, unspecified: Secondary | ICD-10-CM | POA: Diagnosis not present

## 2022-07-26 DIAGNOSIS — D696 Thrombocytopenia, unspecified: Secondary | ICD-10-CM | POA: Diagnosis not present

## 2022-07-26 DIAGNOSIS — I10 Essential (primary) hypertension: Secondary | ICD-10-CM | POA: Diagnosis not present

## 2022-07-26 DIAGNOSIS — I5042 Chronic combined systolic (congestive) and diastolic (congestive) heart failure: Secondary | ICD-10-CM | POA: Diagnosis not present

## 2022-07-26 DIAGNOSIS — I2510 Atherosclerotic heart disease of native coronary artery without angina pectoris: Secondary | ICD-10-CM | POA: Diagnosis not present

## 2022-07-26 DIAGNOSIS — F332 Major depressive disorder, recurrent severe without psychotic features: Secondary | ICD-10-CM | POA: Diagnosis not present

## 2022-07-26 DIAGNOSIS — G894 Chronic pain syndrome: Secondary | ICD-10-CM | POA: Diagnosis not present

## 2022-07-26 DIAGNOSIS — I499 Cardiac arrhythmia, unspecified: Secondary | ICD-10-CM | POA: Diagnosis not present

## 2022-07-26 DIAGNOSIS — D638 Anemia in other chronic diseases classified elsewhere: Secondary | ICD-10-CM | POA: Diagnosis not present

## 2022-07-26 DIAGNOSIS — J449 Chronic obstructive pulmonary disease, unspecified: Secondary | ICD-10-CM | POA: Diagnosis not present

## 2022-07-28 DIAGNOSIS — I472 Ventricular tachycardia, unspecified: Secondary | ICD-10-CM | POA: Diagnosis not present

## 2022-07-28 DIAGNOSIS — K7460 Unspecified cirrhosis of liver: Secondary | ICD-10-CM | POA: Diagnosis not present

## 2022-07-28 DIAGNOSIS — I5042 Chronic combined systolic (congestive) and diastolic (congestive) heart failure: Secondary | ICD-10-CM | POA: Diagnosis not present

## 2022-07-28 DIAGNOSIS — J449 Chronic obstructive pulmonary disease, unspecified: Secondary | ICD-10-CM | POA: Diagnosis not present

## 2022-08-02 DIAGNOSIS — K7460 Unspecified cirrhosis of liver: Secondary | ICD-10-CM | POA: Diagnosis not present

## 2022-08-02 DIAGNOSIS — F332 Major depressive disorder, recurrent severe without psychotic features: Secondary | ICD-10-CM | POA: Diagnosis not present

## 2022-08-02 DIAGNOSIS — G894 Chronic pain syndrome: Secondary | ICD-10-CM | POA: Diagnosis not present

## 2022-08-02 DIAGNOSIS — I10 Essential (primary) hypertension: Secondary | ICD-10-CM | POA: Diagnosis not present

## 2022-08-02 DIAGNOSIS — J449 Chronic obstructive pulmonary disease, unspecified: Secondary | ICD-10-CM | POA: Diagnosis not present

## 2022-08-06 DIAGNOSIS — J449 Chronic obstructive pulmonary disease, unspecified: Secondary | ICD-10-CM | POA: Diagnosis not present

## 2022-08-06 DIAGNOSIS — M6281 Muscle weakness (generalized): Secondary | ICD-10-CM | POA: Diagnosis not present

## 2022-08-06 DIAGNOSIS — R293 Abnormal posture: Secondary | ICD-10-CM | POA: Diagnosis not present

## 2022-08-06 DIAGNOSIS — R262 Difficulty in walking, not elsewhere classified: Secondary | ICD-10-CM | POA: Diagnosis not present

## 2022-08-06 DIAGNOSIS — R1312 Dysphagia, oropharyngeal phase: Secondary | ICD-10-CM | POA: Diagnosis not present

## 2022-08-06 DIAGNOSIS — R2681 Unsteadiness on feet: Secondary | ICD-10-CM | POA: Diagnosis not present

## 2022-08-06 DIAGNOSIS — K7581 Nonalcoholic steatohepatitis (NASH): Secondary | ICD-10-CM | POA: Diagnosis not present

## 2022-08-06 DIAGNOSIS — G9341 Metabolic encephalopathy: Secondary | ICD-10-CM | POA: Diagnosis not present

## 2022-08-06 DIAGNOSIS — I5042 Chronic combined systolic (congestive) and diastolic (congestive) heart failure: Secondary | ICD-10-CM | POA: Diagnosis not present

## 2022-08-07 DIAGNOSIS — J449 Chronic obstructive pulmonary disease, unspecified: Secondary | ICD-10-CM | POA: Diagnosis not present

## 2022-08-07 DIAGNOSIS — G9341 Metabolic encephalopathy: Secondary | ICD-10-CM | POA: Diagnosis not present

## 2022-08-07 DIAGNOSIS — I5042 Chronic combined systolic (congestive) and diastolic (congestive) heart failure: Secondary | ICD-10-CM | POA: Diagnosis not present

## 2022-08-07 DIAGNOSIS — K7581 Nonalcoholic steatohepatitis (NASH): Secondary | ICD-10-CM | POA: Diagnosis not present

## 2022-08-07 DIAGNOSIS — R1312 Dysphagia, oropharyngeal phase: Secondary | ICD-10-CM | POA: Diagnosis not present

## 2022-08-07 DIAGNOSIS — R293 Abnormal posture: Secondary | ICD-10-CM | POA: Diagnosis not present

## 2022-08-07 DIAGNOSIS — R262 Difficulty in walking, not elsewhere classified: Secondary | ICD-10-CM | POA: Diagnosis not present

## 2022-08-07 DIAGNOSIS — R2681 Unsteadiness on feet: Secondary | ICD-10-CM | POA: Diagnosis not present

## 2022-08-07 DIAGNOSIS — M6281 Muscle weakness (generalized): Secondary | ICD-10-CM | POA: Diagnosis not present

## 2022-08-08 DIAGNOSIS — R1312 Dysphagia, oropharyngeal phase: Secondary | ICD-10-CM | POA: Diagnosis not present

## 2022-08-08 DIAGNOSIS — M6281 Muscle weakness (generalized): Secondary | ICD-10-CM | POA: Diagnosis not present

## 2022-08-08 DIAGNOSIS — R2681 Unsteadiness on feet: Secondary | ICD-10-CM | POA: Diagnosis not present

## 2022-08-08 DIAGNOSIS — J449 Chronic obstructive pulmonary disease, unspecified: Secondary | ICD-10-CM | POA: Diagnosis not present

## 2022-08-08 DIAGNOSIS — I5042 Chronic combined systolic (congestive) and diastolic (congestive) heart failure: Secondary | ICD-10-CM | POA: Diagnosis not present

## 2022-08-08 DIAGNOSIS — K7581 Nonalcoholic steatohepatitis (NASH): Secondary | ICD-10-CM | POA: Diagnosis not present

## 2022-08-08 DIAGNOSIS — R262 Difficulty in walking, not elsewhere classified: Secondary | ICD-10-CM | POA: Diagnosis not present

## 2022-08-08 DIAGNOSIS — G9341 Metabolic encephalopathy: Secondary | ICD-10-CM | POA: Diagnosis not present

## 2022-08-08 DIAGNOSIS — R293 Abnormal posture: Secondary | ICD-10-CM | POA: Diagnosis not present

## 2022-08-09 DIAGNOSIS — G9341 Metabolic encephalopathy: Secondary | ICD-10-CM | POA: Diagnosis not present

## 2022-08-09 DIAGNOSIS — M6281 Muscle weakness (generalized): Secondary | ICD-10-CM | POA: Diagnosis not present

## 2022-08-09 DIAGNOSIS — K7581 Nonalcoholic steatohepatitis (NASH): Secondary | ICD-10-CM | POA: Diagnosis not present

## 2022-08-09 DIAGNOSIS — R293 Abnormal posture: Secondary | ICD-10-CM | POA: Diagnosis not present

## 2022-08-09 DIAGNOSIS — R1312 Dysphagia, oropharyngeal phase: Secondary | ICD-10-CM | POA: Diagnosis not present

## 2022-08-09 DIAGNOSIS — R2681 Unsteadiness on feet: Secondary | ICD-10-CM | POA: Diagnosis not present

## 2022-08-09 DIAGNOSIS — J449 Chronic obstructive pulmonary disease, unspecified: Secondary | ICD-10-CM | POA: Diagnosis not present

## 2022-08-09 DIAGNOSIS — R262 Difficulty in walking, not elsewhere classified: Secondary | ICD-10-CM | POA: Diagnosis not present

## 2022-08-09 DIAGNOSIS — I5042 Chronic combined systolic (congestive) and diastolic (congestive) heart failure: Secondary | ICD-10-CM | POA: Diagnosis not present

## 2022-08-10 DIAGNOSIS — G9341 Metabolic encephalopathy: Secondary | ICD-10-CM | POA: Diagnosis not present

## 2022-08-10 DIAGNOSIS — R293 Abnormal posture: Secondary | ICD-10-CM | POA: Diagnosis not present

## 2022-08-10 DIAGNOSIS — R2681 Unsteadiness on feet: Secondary | ICD-10-CM | POA: Diagnosis not present

## 2022-08-10 DIAGNOSIS — R262 Difficulty in walking, not elsewhere classified: Secondary | ICD-10-CM | POA: Diagnosis not present

## 2022-08-10 DIAGNOSIS — K7581 Nonalcoholic steatohepatitis (NASH): Secondary | ICD-10-CM | POA: Diagnosis not present

## 2022-08-10 DIAGNOSIS — M6281 Muscle weakness (generalized): Secondary | ICD-10-CM | POA: Diagnosis not present

## 2022-08-10 DIAGNOSIS — J449 Chronic obstructive pulmonary disease, unspecified: Secondary | ICD-10-CM | POA: Diagnosis not present

## 2022-08-10 DIAGNOSIS — I5042 Chronic combined systolic (congestive) and diastolic (congestive) heart failure: Secondary | ICD-10-CM | POA: Diagnosis not present

## 2022-08-10 DIAGNOSIS — R1312 Dysphagia, oropharyngeal phase: Secondary | ICD-10-CM | POA: Diagnosis not present

## 2022-08-11 DIAGNOSIS — M6281 Muscle weakness (generalized): Secondary | ICD-10-CM | POA: Diagnosis not present

## 2022-08-11 DIAGNOSIS — I5042 Chronic combined systolic (congestive) and diastolic (congestive) heart failure: Secondary | ICD-10-CM | POA: Diagnosis not present

## 2022-08-11 DIAGNOSIS — K7581 Nonalcoholic steatohepatitis (NASH): Secondary | ICD-10-CM | POA: Diagnosis not present

## 2022-08-11 DIAGNOSIS — R2681 Unsteadiness on feet: Secondary | ICD-10-CM | POA: Diagnosis not present

## 2022-08-11 DIAGNOSIS — G9341 Metabolic encephalopathy: Secondary | ICD-10-CM | POA: Diagnosis not present

## 2022-08-11 DIAGNOSIS — R262 Difficulty in walking, not elsewhere classified: Secondary | ICD-10-CM | POA: Diagnosis not present

## 2022-08-11 DIAGNOSIS — R1312 Dysphagia, oropharyngeal phase: Secondary | ICD-10-CM | POA: Diagnosis not present

## 2022-08-11 DIAGNOSIS — J449 Chronic obstructive pulmonary disease, unspecified: Secondary | ICD-10-CM | POA: Diagnosis not present

## 2022-08-11 DIAGNOSIS — R293 Abnormal posture: Secondary | ICD-10-CM | POA: Diagnosis not present

## 2022-08-14 DIAGNOSIS — M6281 Muscle weakness (generalized): Secondary | ICD-10-CM | POA: Diagnosis not present

## 2022-08-14 DIAGNOSIS — R2681 Unsteadiness on feet: Secondary | ICD-10-CM | POA: Diagnosis not present

## 2022-08-14 DIAGNOSIS — R262 Difficulty in walking, not elsewhere classified: Secondary | ICD-10-CM | POA: Diagnosis not present

## 2022-08-14 DIAGNOSIS — K7581 Nonalcoholic steatohepatitis (NASH): Secondary | ICD-10-CM | POA: Diagnosis not present

## 2022-08-14 DIAGNOSIS — R293 Abnormal posture: Secondary | ICD-10-CM | POA: Diagnosis not present

## 2022-08-14 DIAGNOSIS — G9341 Metabolic encephalopathy: Secondary | ICD-10-CM | POA: Diagnosis not present

## 2022-08-14 DIAGNOSIS — R1312 Dysphagia, oropharyngeal phase: Secondary | ICD-10-CM | POA: Diagnosis not present

## 2022-08-14 DIAGNOSIS — J449 Chronic obstructive pulmonary disease, unspecified: Secondary | ICD-10-CM | POA: Diagnosis not present

## 2022-08-14 DIAGNOSIS — I5042 Chronic combined systolic (congestive) and diastolic (congestive) heart failure: Secondary | ICD-10-CM | POA: Diagnosis not present

## 2022-08-15 DIAGNOSIS — J449 Chronic obstructive pulmonary disease, unspecified: Secondary | ICD-10-CM | POA: Diagnosis not present

## 2022-08-15 DIAGNOSIS — I5042 Chronic combined systolic (congestive) and diastolic (congestive) heart failure: Secondary | ICD-10-CM | POA: Diagnosis not present

## 2022-08-15 DIAGNOSIS — R2681 Unsteadiness on feet: Secondary | ICD-10-CM | POA: Diagnosis not present

## 2022-08-15 DIAGNOSIS — R293 Abnormal posture: Secondary | ICD-10-CM | POA: Diagnosis not present

## 2022-08-15 DIAGNOSIS — R262 Difficulty in walking, not elsewhere classified: Secondary | ICD-10-CM | POA: Diagnosis not present

## 2022-08-15 DIAGNOSIS — K7581 Nonalcoholic steatohepatitis (NASH): Secondary | ICD-10-CM | POA: Diagnosis not present

## 2022-08-15 DIAGNOSIS — M6281 Muscle weakness (generalized): Secondary | ICD-10-CM | POA: Diagnosis not present

## 2022-08-15 DIAGNOSIS — G9341 Metabolic encephalopathy: Secondary | ICD-10-CM | POA: Diagnosis not present

## 2022-08-15 DIAGNOSIS — R1312 Dysphagia, oropharyngeal phase: Secondary | ICD-10-CM | POA: Diagnosis not present

## 2022-08-16 DIAGNOSIS — I5042 Chronic combined systolic (congestive) and diastolic (congestive) heart failure: Secondary | ICD-10-CM | POA: Diagnosis not present

## 2022-08-16 DIAGNOSIS — R2681 Unsteadiness on feet: Secondary | ICD-10-CM | POA: Diagnosis not present

## 2022-08-16 DIAGNOSIS — R262 Difficulty in walking, not elsewhere classified: Secondary | ICD-10-CM | POA: Diagnosis not present

## 2022-08-16 DIAGNOSIS — R293 Abnormal posture: Secondary | ICD-10-CM | POA: Diagnosis not present

## 2022-08-16 DIAGNOSIS — G9341 Metabolic encephalopathy: Secondary | ICD-10-CM | POA: Diagnosis not present

## 2022-08-16 DIAGNOSIS — J449 Chronic obstructive pulmonary disease, unspecified: Secondary | ICD-10-CM | POA: Diagnosis not present

## 2022-08-16 DIAGNOSIS — R1312 Dysphagia, oropharyngeal phase: Secondary | ICD-10-CM | POA: Diagnosis not present

## 2022-08-16 DIAGNOSIS — M6281 Muscle weakness (generalized): Secondary | ICD-10-CM | POA: Diagnosis not present

## 2022-08-16 DIAGNOSIS — K7581 Nonalcoholic steatohepatitis (NASH): Secondary | ICD-10-CM | POA: Diagnosis not present

## 2022-08-17 DIAGNOSIS — R293 Abnormal posture: Secondary | ICD-10-CM | POA: Diagnosis not present

## 2022-08-17 DIAGNOSIS — K7581 Nonalcoholic steatohepatitis (NASH): Secondary | ICD-10-CM | POA: Diagnosis not present

## 2022-08-17 DIAGNOSIS — R1312 Dysphagia, oropharyngeal phase: Secondary | ICD-10-CM | POA: Diagnosis not present

## 2022-08-17 DIAGNOSIS — I5042 Chronic combined systolic (congestive) and diastolic (congestive) heart failure: Secondary | ICD-10-CM | POA: Diagnosis not present

## 2022-08-17 DIAGNOSIS — R2681 Unsteadiness on feet: Secondary | ICD-10-CM | POA: Diagnosis not present

## 2022-08-17 DIAGNOSIS — R262 Difficulty in walking, not elsewhere classified: Secondary | ICD-10-CM | POA: Diagnosis not present

## 2022-08-17 DIAGNOSIS — J449 Chronic obstructive pulmonary disease, unspecified: Secondary | ICD-10-CM | POA: Diagnosis not present

## 2022-08-17 DIAGNOSIS — G9341 Metabolic encephalopathy: Secondary | ICD-10-CM | POA: Diagnosis not present

## 2022-08-17 DIAGNOSIS — M6281 Muscle weakness (generalized): Secondary | ICD-10-CM | POA: Diagnosis not present

## 2022-08-18 DIAGNOSIS — M6281 Muscle weakness (generalized): Secondary | ICD-10-CM | POA: Diagnosis not present

## 2022-08-18 DIAGNOSIS — J449 Chronic obstructive pulmonary disease, unspecified: Secondary | ICD-10-CM | POA: Diagnosis not present

## 2022-08-18 DIAGNOSIS — R262 Difficulty in walking, not elsewhere classified: Secondary | ICD-10-CM | POA: Diagnosis not present

## 2022-08-18 DIAGNOSIS — R293 Abnormal posture: Secondary | ICD-10-CM | POA: Diagnosis not present

## 2022-08-18 DIAGNOSIS — I5042 Chronic combined systolic (congestive) and diastolic (congestive) heart failure: Secondary | ICD-10-CM | POA: Diagnosis not present

## 2022-08-18 DIAGNOSIS — R2681 Unsteadiness on feet: Secondary | ICD-10-CM | POA: Diagnosis not present

## 2022-08-18 DIAGNOSIS — R1312 Dysphagia, oropharyngeal phase: Secondary | ICD-10-CM | POA: Diagnosis not present

## 2022-08-18 DIAGNOSIS — K7581 Nonalcoholic steatohepatitis (NASH): Secondary | ICD-10-CM | POA: Diagnosis not present

## 2022-08-18 DIAGNOSIS — G9341 Metabolic encephalopathy: Secondary | ICD-10-CM | POA: Diagnosis not present

## 2022-08-20 DIAGNOSIS — K7460 Unspecified cirrhosis of liver: Secondary | ICD-10-CM | POA: Diagnosis not present

## 2022-08-20 DIAGNOSIS — J449 Chronic obstructive pulmonary disease, unspecified: Secondary | ICD-10-CM | POA: Diagnosis not present

## 2022-08-20 DIAGNOSIS — I5042 Chronic combined systolic (congestive) and diastolic (congestive) heart failure: Secondary | ICD-10-CM | POA: Diagnosis not present

## 2022-08-20 DIAGNOSIS — I472 Ventricular tachycardia, unspecified: Secondary | ICD-10-CM | POA: Diagnosis not present

## 2022-08-21 DIAGNOSIS — R262 Difficulty in walking, not elsewhere classified: Secondary | ICD-10-CM | POA: Diagnosis not present

## 2022-08-21 DIAGNOSIS — I5042 Chronic combined systolic (congestive) and diastolic (congestive) heart failure: Secondary | ICD-10-CM | POA: Diagnosis not present

## 2022-08-21 DIAGNOSIS — J449 Chronic obstructive pulmonary disease, unspecified: Secondary | ICD-10-CM | POA: Diagnosis not present

## 2022-08-21 DIAGNOSIS — R2681 Unsteadiness on feet: Secondary | ICD-10-CM | POA: Diagnosis not present

## 2022-08-21 DIAGNOSIS — K7581 Nonalcoholic steatohepatitis (NASH): Secondary | ICD-10-CM | POA: Diagnosis not present

## 2022-08-21 DIAGNOSIS — M6281 Muscle weakness (generalized): Secondary | ICD-10-CM | POA: Diagnosis not present

## 2022-08-21 DIAGNOSIS — R1312 Dysphagia, oropharyngeal phase: Secondary | ICD-10-CM | POA: Diagnosis not present

## 2022-08-21 DIAGNOSIS — R293 Abnormal posture: Secondary | ICD-10-CM | POA: Diagnosis not present

## 2022-08-21 DIAGNOSIS — G9341 Metabolic encephalopathy: Secondary | ICD-10-CM | POA: Diagnosis not present

## 2022-08-22 DIAGNOSIS — R293 Abnormal posture: Secondary | ICD-10-CM | POA: Diagnosis not present

## 2022-08-22 DIAGNOSIS — J449 Chronic obstructive pulmonary disease, unspecified: Secondary | ICD-10-CM | POA: Diagnosis not present

## 2022-08-22 DIAGNOSIS — K7460 Unspecified cirrhosis of liver: Secondary | ICD-10-CM | POA: Diagnosis not present

## 2022-08-22 DIAGNOSIS — I10 Essential (primary) hypertension: Secondary | ICD-10-CM | POA: Diagnosis not present

## 2022-08-22 DIAGNOSIS — R627 Adult failure to thrive: Secondary | ICD-10-CM | POA: Diagnosis not present

## 2022-08-22 DIAGNOSIS — M6281 Muscle weakness (generalized): Secondary | ICD-10-CM | POA: Diagnosis not present

## 2022-08-22 DIAGNOSIS — K7581 Nonalcoholic steatohepatitis (NASH): Secondary | ICD-10-CM | POA: Diagnosis not present

## 2022-08-22 DIAGNOSIS — R2681 Unsteadiness on feet: Secondary | ICD-10-CM | POA: Diagnosis not present

## 2022-08-22 DIAGNOSIS — G894 Chronic pain syndrome: Secondary | ICD-10-CM | POA: Diagnosis not present

## 2022-08-22 DIAGNOSIS — R1312 Dysphagia, oropharyngeal phase: Secondary | ICD-10-CM | POA: Diagnosis not present

## 2022-08-22 DIAGNOSIS — R600 Localized edema: Secondary | ICD-10-CM | POA: Diagnosis not present

## 2022-08-22 DIAGNOSIS — I5042 Chronic combined systolic (congestive) and diastolic (congestive) heart failure: Secondary | ICD-10-CM | POA: Diagnosis not present

## 2022-08-22 DIAGNOSIS — R262 Difficulty in walking, not elsewhere classified: Secondary | ICD-10-CM | POA: Diagnosis not present

## 2022-08-22 DIAGNOSIS — G9341 Metabolic encephalopathy: Secondary | ICD-10-CM | POA: Diagnosis not present

## 2022-08-23 DIAGNOSIS — E119 Type 2 diabetes mellitus without complications: Secondary | ICD-10-CM | POA: Diagnosis not present

## 2022-08-23 DIAGNOSIS — G9341 Metabolic encephalopathy: Secondary | ICD-10-CM | POA: Diagnosis not present

## 2022-08-23 DIAGNOSIS — I5042 Chronic combined systolic (congestive) and diastolic (congestive) heart failure: Secondary | ICD-10-CM | POA: Diagnosis not present

## 2022-08-23 DIAGNOSIS — R293 Abnormal posture: Secondary | ICD-10-CM | POA: Diagnosis not present

## 2022-08-23 DIAGNOSIS — R262 Difficulty in walking, not elsewhere classified: Secondary | ICD-10-CM | POA: Diagnosis not present

## 2022-08-23 DIAGNOSIS — M6281 Muscle weakness (generalized): Secondary | ICD-10-CM | POA: Diagnosis not present

## 2022-08-23 DIAGNOSIS — R2681 Unsteadiness on feet: Secondary | ICD-10-CM | POA: Diagnosis not present

## 2022-08-23 DIAGNOSIS — R2241 Localized swelling, mass and lump, right lower limb: Secondary | ICD-10-CM | POA: Diagnosis not present

## 2022-08-23 DIAGNOSIS — K7581 Nonalcoholic steatohepatitis (NASH): Secondary | ICD-10-CM | POA: Diagnosis not present

## 2022-08-23 DIAGNOSIS — R1312 Dysphagia, oropharyngeal phase: Secondary | ICD-10-CM | POA: Diagnosis not present

## 2022-08-23 DIAGNOSIS — J449 Chronic obstructive pulmonary disease, unspecified: Secondary | ICD-10-CM | POA: Diagnosis not present

## 2022-08-24 DIAGNOSIS — R1312 Dysphagia, oropharyngeal phase: Secondary | ICD-10-CM | POA: Diagnosis not present

## 2022-08-24 DIAGNOSIS — L97812 Non-pressure chronic ulcer of other part of right lower leg with fat layer exposed: Secondary | ICD-10-CM | POA: Diagnosis not present

## 2022-08-24 DIAGNOSIS — R293 Abnormal posture: Secondary | ICD-10-CM | POA: Diagnosis not present

## 2022-08-24 DIAGNOSIS — K7581 Nonalcoholic steatohepatitis (NASH): Secondary | ICD-10-CM | POA: Diagnosis not present

## 2022-08-24 DIAGNOSIS — R262 Difficulty in walking, not elsewhere classified: Secondary | ICD-10-CM | POA: Diagnosis not present

## 2022-08-24 DIAGNOSIS — Z6841 Body Mass Index (BMI) 40.0 and over, adult: Secondary | ICD-10-CM | POA: Diagnosis not present

## 2022-08-24 DIAGNOSIS — I87311 Chronic venous hypertension (idiopathic) with ulcer of right lower extremity: Secondary | ICD-10-CM | POA: Diagnosis not present

## 2022-08-24 DIAGNOSIS — G9341 Metabolic encephalopathy: Secondary | ICD-10-CM | POA: Diagnosis not present

## 2022-08-24 DIAGNOSIS — I5042 Chronic combined systolic (congestive) and diastolic (congestive) heart failure: Secondary | ICD-10-CM | POA: Diagnosis not present

## 2022-08-24 DIAGNOSIS — J449 Chronic obstructive pulmonary disease, unspecified: Secondary | ICD-10-CM | POA: Diagnosis not present

## 2022-08-24 DIAGNOSIS — R2681 Unsteadiness on feet: Secondary | ICD-10-CM | POA: Diagnosis not present

## 2022-08-24 DIAGNOSIS — M6281 Muscle weakness (generalized): Secondary | ICD-10-CM | POA: Diagnosis not present

## 2022-08-25 DIAGNOSIS — K7581 Nonalcoholic steatohepatitis (NASH): Secondary | ICD-10-CM | POA: Diagnosis not present

## 2022-08-25 DIAGNOSIS — G9341 Metabolic encephalopathy: Secondary | ICD-10-CM | POA: Diagnosis not present

## 2022-08-25 DIAGNOSIS — M6281 Muscle weakness (generalized): Secondary | ICD-10-CM | POA: Diagnosis not present

## 2022-08-25 DIAGNOSIS — I5042 Chronic combined systolic (congestive) and diastolic (congestive) heart failure: Secondary | ICD-10-CM | POA: Diagnosis not present

## 2022-08-25 DIAGNOSIS — R262 Difficulty in walking, not elsewhere classified: Secondary | ICD-10-CM | POA: Diagnosis not present

## 2022-08-25 DIAGNOSIS — R2681 Unsteadiness on feet: Secondary | ICD-10-CM | POA: Diagnosis not present

## 2022-08-25 DIAGNOSIS — R1312 Dysphagia, oropharyngeal phase: Secondary | ICD-10-CM | POA: Diagnosis not present

## 2022-08-25 DIAGNOSIS — R293 Abnormal posture: Secondary | ICD-10-CM | POA: Diagnosis not present

## 2022-08-25 DIAGNOSIS — J449 Chronic obstructive pulmonary disease, unspecified: Secondary | ICD-10-CM | POA: Diagnosis not present

## 2022-08-26 DIAGNOSIS — R262 Difficulty in walking, not elsewhere classified: Secondary | ICD-10-CM | POA: Diagnosis not present

## 2022-08-26 DIAGNOSIS — G9341 Metabolic encephalopathy: Secondary | ICD-10-CM | POA: Diagnosis not present

## 2022-08-26 DIAGNOSIS — R293 Abnormal posture: Secondary | ICD-10-CM | POA: Diagnosis not present

## 2022-08-26 DIAGNOSIS — J449 Chronic obstructive pulmonary disease, unspecified: Secondary | ICD-10-CM | POA: Diagnosis not present

## 2022-08-26 DIAGNOSIS — M6281 Muscle weakness (generalized): Secondary | ICD-10-CM | POA: Diagnosis not present

## 2022-08-26 DIAGNOSIS — I5042 Chronic combined systolic (congestive) and diastolic (congestive) heart failure: Secondary | ICD-10-CM | POA: Diagnosis not present

## 2022-08-26 DIAGNOSIS — R2681 Unsteadiness on feet: Secondary | ICD-10-CM | POA: Diagnosis not present

## 2022-08-26 DIAGNOSIS — R1312 Dysphagia, oropharyngeal phase: Secondary | ICD-10-CM | POA: Diagnosis not present

## 2022-08-26 DIAGNOSIS — K7581 Nonalcoholic steatohepatitis (NASH): Secondary | ICD-10-CM | POA: Diagnosis not present

## 2022-08-27 DIAGNOSIS — M6281 Muscle weakness (generalized): Secondary | ICD-10-CM | POA: Diagnosis not present

## 2022-08-27 DIAGNOSIS — G9341 Metabolic encephalopathy: Secondary | ICD-10-CM | POA: Diagnosis not present

## 2022-08-27 DIAGNOSIS — R2681 Unsteadiness on feet: Secondary | ICD-10-CM | POA: Diagnosis not present

## 2022-08-27 DIAGNOSIS — R262 Difficulty in walking, not elsewhere classified: Secondary | ICD-10-CM | POA: Diagnosis not present

## 2022-08-27 DIAGNOSIS — R1312 Dysphagia, oropharyngeal phase: Secondary | ICD-10-CM | POA: Diagnosis not present

## 2022-08-27 DIAGNOSIS — I5042 Chronic combined systolic (congestive) and diastolic (congestive) heart failure: Secondary | ICD-10-CM | POA: Diagnosis not present

## 2022-08-27 DIAGNOSIS — K7581 Nonalcoholic steatohepatitis (NASH): Secondary | ICD-10-CM | POA: Diagnosis not present

## 2022-08-27 DIAGNOSIS — J449 Chronic obstructive pulmonary disease, unspecified: Secondary | ICD-10-CM | POA: Diagnosis not present

## 2022-08-27 DIAGNOSIS — R293 Abnormal posture: Secondary | ICD-10-CM | POA: Diagnosis not present

## 2022-08-28 DIAGNOSIS — J449 Chronic obstructive pulmonary disease, unspecified: Secondary | ICD-10-CM | POA: Diagnosis not present

## 2022-08-28 DIAGNOSIS — R262 Difficulty in walking, not elsewhere classified: Secondary | ICD-10-CM | POA: Diagnosis not present

## 2022-08-28 DIAGNOSIS — R1312 Dysphagia, oropharyngeal phase: Secondary | ICD-10-CM | POA: Diagnosis not present

## 2022-08-28 DIAGNOSIS — M6281 Muscle weakness (generalized): Secondary | ICD-10-CM | POA: Diagnosis not present

## 2022-08-28 DIAGNOSIS — R2681 Unsteadiness on feet: Secondary | ICD-10-CM | POA: Diagnosis not present

## 2022-08-28 DIAGNOSIS — K7581 Nonalcoholic steatohepatitis (NASH): Secondary | ICD-10-CM | POA: Diagnosis not present

## 2022-08-28 DIAGNOSIS — G9341 Metabolic encephalopathy: Secondary | ICD-10-CM | POA: Diagnosis not present

## 2022-08-28 DIAGNOSIS — R293 Abnormal posture: Secondary | ICD-10-CM | POA: Diagnosis not present

## 2022-08-28 DIAGNOSIS — I5042 Chronic combined systolic (congestive) and diastolic (congestive) heart failure: Secondary | ICD-10-CM | POA: Diagnosis not present

## 2022-08-29 DIAGNOSIS — R262 Difficulty in walking, not elsewhere classified: Secondary | ICD-10-CM | POA: Diagnosis not present

## 2022-08-29 DIAGNOSIS — R293 Abnormal posture: Secondary | ICD-10-CM | POA: Diagnosis not present

## 2022-08-29 DIAGNOSIS — G9341 Metabolic encephalopathy: Secondary | ICD-10-CM | POA: Diagnosis not present

## 2022-08-29 DIAGNOSIS — R2681 Unsteadiness on feet: Secondary | ICD-10-CM | POA: Diagnosis not present

## 2022-08-29 DIAGNOSIS — D649 Anemia, unspecified: Secondary | ICD-10-CM | POA: Diagnosis not present

## 2022-08-29 DIAGNOSIS — K7581 Nonalcoholic steatohepatitis (NASH): Secondary | ICD-10-CM | POA: Diagnosis not present

## 2022-08-29 DIAGNOSIS — I5042 Chronic combined systolic (congestive) and diastolic (congestive) heart failure: Secondary | ICD-10-CM | POA: Diagnosis not present

## 2022-08-29 DIAGNOSIS — M6281 Muscle weakness (generalized): Secondary | ICD-10-CM | POA: Diagnosis not present

## 2022-08-29 DIAGNOSIS — J449 Chronic obstructive pulmonary disease, unspecified: Secondary | ICD-10-CM | POA: Diagnosis not present

## 2022-08-29 DIAGNOSIS — R1312 Dysphagia, oropharyngeal phase: Secondary | ICD-10-CM | POA: Diagnosis not present

## 2022-08-30 ENCOUNTER — Encounter: Payer: Self-pay | Admitting: Family Medicine

## 2022-08-30 ENCOUNTER — Non-Acute Institutional Stay: Payer: Medicare HMO | Admitting: Family Medicine

## 2022-08-30 VITALS — BP 124/68 | HR 74 | Temp 98.7°F

## 2022-08-30 DIAGNOSIS — F332 Major depressive disorder, recurrent severe without psychotic features: Secondary | ICD-10-CM | POA: Diagnosis not present

## 2022-08-30 DIAGNOSIS — R627 Adult failure to thrive: Secondary | ICD-10-CM | POA: Diagnosis not present

## 2022-08-30 DIAGNOSIS — R1319 Other dysphagia: Secondary | ICD-10-CM

## 2022-08-30 DIAGNOSIS — G8929 Other chronic pain: Secondary | ICD-10-CM

## 2022-08-30 DIAGNOSIS — I5042 Chronic combined systolic (congestive) and diastolic (congestive) heart failure: Secondary | ICD-10-CM

## 2022-08-30 DIAGNOSIS — B3749 Other urogenital candidiasis: Secondary | ICD-10-CM

## 2022-08-30 DIAGNOSIS — R293 Abnormal posture: Secondary | ICD-10-CM | POA: Diagnosis not present

## 2022-08-30 DIAGNOSIS — M6281 Muscle weakness (generalized): Secondary | ICD-10-CM | POA: Diagnosis not present

## 2022-08-30 DIAGNOSIS — R600 Localized edema: Secondary | ICD-10-CM | POA: Diagnosis not present

## 2022-08-30 DIAGNOSIS — J449 Chronic obstructive pulmonary disease, unspecified: Secondary | ICD-10-CM | POA: Diagnosis not present

## 2022-08-30 DIAGNOSIS — R2681 Unsteadiness on feet: Secondary | ICD-10-CM | POA: Diagnosis not present

## 2022-08-30 DIAGNOSIS — R1312 Dysphagia, oropharyngeal phase: Secondary | ICD-10-CM | POA: Diagnosis not present

## 2022-08-30 DIAGNOSIS — R262 Difficulty in walking, not elsewhere classified: Secondary | ICD-10-CM | POA: Diagnosis not present

## 2022-08-30 DIAGNOSIS — M549 Dorsalgia, unspecified: Secondary | ICD-10-CM | POA: Diagnosis not present

## 2022-08-30 DIAGNOSIS — K7581 Nonalcoholic steatohepatitis (NASH): Secondary | ICD-10-CM | POA: Diagnosis not present

## 2022-08-30 DIAGNOSIS — K7460 Unspecified cirrhosis of liver: Secondary | ICD-10-CM | POA: Diagnosis not present

## 2022-08-30 DIAGNOSIS — D696 Thrombocytopenia, unspecified: Secondary | ICD-10-CM | POA: Diagnosis not present

## 2022-08-30 DIAGNOSIS — G9341 Metabolic encephalopathy: Secondary | ICD-10-CM | POA: Diagnosis not present

## 2022-08-30 DIAGNOSIS — G894 Chronic pain syndrome: Secondary | ICD-10-CM | POA: Diagnosis not present
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
## 2022-08-31 DIAGNOSIS — R293 Abnormal posture: Secondary | ICD-10-CM | POA: Diagnosis not present

## 2022-08-31 DIAGNOSIS — L97812 Non-pressure chronic ulcer of other part of right lower leg with fat layer exposed: Secondary | ICD-10-CM | POA: Diagnosis not present

## 2022-08-31 DIAGNOSIS — K7581 Nonalcoholic steatohepatitis (NASH): Secondary | ICD-10-CM | POA: Diagnosis not present

## 2022-08-31 DIAGNOSIS — R262 Difficulty in walking, not elsewhere classified: Secondary | ICD-10-CM | POA: Diagnosis not present

## 2022-08-31 DIAGNOSIS — J449 Chronic obstructive pulmonary disease, unspecified: Secondary | ICD-10-CM | POA: Diagnosis not present

## 2022-08-31 DIAGNOSIS — R1312 Dysphagia, oropharyngeal phase: Secondary | ICD-10-CM | POA: Diagnosis not present

## 2022-08-31 DIAGNOSIS — I87311 Chronic venous hypertension (idiopathic) with ulcer of right lower extremity: Secondary | ICD-10-CM | POA: Diagnosis not present

## 2022-08-31 DIAGNOSIS — M6281 Muscle weakness (generalized): Secondary | ICD-10-CM | POA: Diagnosis not present

## 2022-08-31 DIAGNOSIS — G9341 Metabolic encephalopathy: Secondary | ICD-10-CM | POA: Diagnosis not present

## 2022-08-31 DIAGNOSIS — Z6841 Body Mass Index (BMI) 40.0 and over, adult: Secondary | ICD-10-CM | POA: Diagnosis not present

## 2022-08-31 DIAGNOSIS — R2681 Unsteadiness on feet: Secondary | ICD-10-CM | POA: Diagnosis not present

## 2022-08-31 DIAGNOSIS — I5042 Chronic combined systolic (congestive) and diastolic (congestive) heart failure: Secondary | ICD-10-CM | POA: Diagnosis not present

## 2022-09-04 DIAGNOSIS — J449 Chronic obstructive pulmonary disease, unspecified: Secondary | ICD-10-CM | POA: Diagnosis not present

## 2022-09-04 DIAGNOSIS — I5042 Chronic combined systolic (congestive) and diastolic (congestive) heart failure: Secondary | ICD-10-CM | POA: Diagnosis not present

## 2022-09-04 DIAGNOSIS — R293 Abnormal posture: Secondary | ICD-10-CM | POA: Diagnosis not present

## 2022-09-04 DIAGNOSIS — K7581 Nonalcoholic steatohepatitis (NASH): Secondary | ICD-10-CM | POA: Diagnosis not present

## 2022-09-04 DIAGNOSIS — R2681 Unsteadiness on feet: Secondary | ICD-10-CM | POA: Diagnosis not present

## 2022-09-04 DIAGNOSIS — G9341 Metabolic encephalopathy: Secondary | ICD-10-CM | POA: Diagnosis not present

## 2022-09-04 DIAGNOSIS — M6281 Muscle weakness (generalized): Secondary | ICD-10-CM | POA: Diagnosis not present

## 2022-09-04 DIAGNOSIS — R262 Difficulty in walking, not elsewhere classified: Secondary | ICD-10-CM | POA: Diagnosis not present

## 2022-09-05 DIAGNOSIS — R2681 Unsteadiness on feet: Secondary | ICD-10-CM | POA: Diagnosis not present

## 2022-09-05 DIAGNOSIS — R293 Abnormal posture: Secondary | ICD-10-CM | POA: Diagnosis not present

## 2022-09-05 DIAGNOSIS — K7581 Nonalcoholic steatohepatitis (NASH): Secondary | ICD-10-CM | POA: Diagnosis not present

## 2022-09-05 DIAGNOSIS — I5042 Chronic combined systolic (congestive) and diastolic (congestive) heart failure: Secondary | ICD-10-CM | POA: Diagnosis not present

## 2022-09-05 DIAGNOSIS — M6281 Muscle weakness (generalized): Secondary | ICD-10-CM | POA: Diagnosis not present

## 2022-09-05 DIAGNOSIS — G9341 Metabolic encephalopathy: Secondary | ICD-10-CM | POA: Diagnosis not present

## 2022-09-05 DIAGNOSIS — J449 Chronic obstructive pulmonary disease, unspecified: Secondary | ICD-10-CM | POA: Diagnosis not present

## 2022-09-05 DIAGNOSIS — R262 Difficulty in walking, not elsewhere classified: Secondary | ICD-10-CM | POA: Diagnosis not present

## 2022-09-06 DIAGNOSIS — G9341 Metabolic encephalopathy: Secondary | ICD-10-CM | POA: Diagnosis not present

## 2022-09-06 DIAGNOSIS — R293 Abnormal posture: Secondary | ICD-10-CM | POA: Diagnosis not present

## 2022-09-06 DIAGNOSIS — R2681 Unsteadiness on feet: Secondary | ICD-10-CM | POA: Diagnosis not present

## 2022-09-06 DIAGNOSIS — K7581 Nonalcoholic steatohepatitis (NASH): Secondary | ICD-10-CM | POA: Diagnosis not present

## 2022-09-06 DIAGNOSIS — I5042 Chronic combined systolic (congestive) and diastolic (congestive) heart failure: Secondary | ICD-10-CM | POA: Diagnosis not present

## 2022-09-06 DIAGNOSIS — J449 Chronic obstructive pulmonary disease, unspecified: Secondary | ICD-10-CM | POA: Diagnosis not present

## 2022-09-06 DIAGNOSIS — R262 Difficulty in walking, not elsewhere classified: Secondary | ICD-10-CM | POA: Diagnosis not present

## 2022-09-06 DIAGNOSIS — M6281 Muscle weakness (generalized): Secondary | ICD-10-CM | POA: Diagnosis not present

## 2022-09-07 DIAGNOSIS — I5042 Chronic combined systolic (congestive) and diastolic (congestive) heart failure: Secondary | ICD-10-CM | POA: Diagnosis not present

## 2022-09-07 DIAGNOSIS — L97222 Non-pressure chronic ulcer of left calf with fat layer exposed: Secondary | ICD-10-CM | POA: Diagnosis not present

## 2022-09-07 DIAGNOSIS — J449 Chronic obstructive pulmonary disease, unspecified: Secondary | ICD-10-CM | POA: Diagnosis not present

## 2022-09-07 DIAGNOSIS — R262 Difficulty in walking, not elsewhere classified: Secondary | ICD-10-CM | POA: Diagnosis not present

## 2022-09-07 DIAGNOSIS — G9341 Metabolic encephalopathy: Secondary | ICD-10-CM | POA: Diagnosis not present

## 2022-09-07 DIAGNOSIS — M6281 Muscle weakness (generalized): Secondary | ICD-10-CM | POA: Diagnosis not present

## 2022-09-07 DIAGNOSIS — L03119 Cellulitis of unspecified part of limb: Secondary | ICD-10-CM | POA: Diagnosis not present

## 2022-09-07 DIAGNOSIS — Z6841 Body Mass Index (BMI) 40.0 and over, adult: Secondary | ICD-10-CM | POA: Diagnosis not present

## 2022-09-07 DIAGNOSIS — I87312 Chronic venous hypertension (idiopathic) with ulcer of left lower extremity: Secondary | ICD-10-CM | POA: Diagnosis not present

## 2022-09-07 DIAGNOSIS — K7581 Nonalcoholic steatohepatitis (NASH): Secondary | ICD-10-CM | POA: Diagnosis not present

## 2022-09-07 DIAGNOSIS — G894 Chronic pain syndrome: Secondary | ICD-10-CM | POA: Diagnosis not present

## 2022-09-07 DIAGNOSIS — K7460 Unspecified cirrhosis of liver: Secondary | ICD-10-CM | POA: Diagnosis not present

## 2022-09-07 DIAGNOSIS — R600 Localized edema: Secondary | ICD-10-CM | POA: Diagnosis not present

## 2022-09-07 DIAGNOSIS — R2681 Unsteadiness on feet: Secondary | ICD-10-CM | POA: Diagnosis not present

## 2022-09-07 DIAGNOSIS — I10 Essential (primary) hypertension: Secondary | ICD-10-CM | POA: Diagnosis not present

## 2022-09-07 DIAGNOSIS — R293 Abnormal posture: Secondary | ICD-10-CM | POA: Diagnosis not present

## 2022-09-08 DIAGNOSIS — I5042 Chronic combined systolic (congestive) and diastolic (congestive) heart failure: Secondary | ICD-10-CM | POA: Diagnosis not present

## 2022-09-08 DIAGNOSIS — M6281 Muscle weakness (generalized): Secondary | ICD-10-CM | POA: Diagnosis not present

## 2022-09-08 DIAGNOSIS — R2681 Unsteadiness on feet: Secondary | ICD-10-CM | POA: Diagnosis not present

## 2022-09-08 DIAGNOSIS — K7581 Nonalcoholic steatohepatitis (NASH): Secondary | ICD-10-CM | POA: Diagnosis not present

## 2022-09-08 DIAGNOSIS — R293 Abnormal posture: Secondary | ICD-10-CM | POA: Diagnosis not present

## 2022-09-08 DIAGNOSIS — J449 Chronic obstructive pulmonary disease, unspecified: Secondary | ICD-10-CM | POA: Diagnosis not present

## 2022-09-08 DIAGNOSIS — G9341 Metabolic encephalopathy: Secondary | ICD-10-CM | POA: Diagnosis not present

## 2022-09-08 DIAGNOSIS — R262 Difficulty in walking, not elsewhere classified: Secondary | ICD-10-CM | POA: Diagnosis not present

## 2022-09-11 DIAGNOSIS — R293 Abnormal posture: Secondary | ICD-10-CM | POA: Diagnosis not present

## 2022-09-11 DIAGNOSIS — K7581 Nonalcoholic steatohepatitis (NASH): Secondary | ICD-10-CM | POA: Diagnosis not present

## 2022-09-11 DIAGNOSIS — G9341 Metabolic encephalopathy: Secondary | ICD-10-CM | POA: Diagnosis not present

## 2022-09-11 DIAGNOSIS — R2681 Unsteadiness on feet: Secondary | ICD-10-CM | POA: Diagnosis not present

## 2022-09-11 DIAGNOSIS — I5042 Chronic combined systolic (congestive) and diastolic (congestive) heart failure: Secondary | ICD-10-CM | POA: Diagnosis not present

## 2022-09-11 DIAGNOSIS — J449 Chronic obstructive pulmonary disease, unspecified: Secondary | ICD-10-CM | POA: Diagnosis not present

## 2022-09-11 DIAGNOSIS — R262 Difficulty in walking, not elsewhere classified: Secondary | ICD-10-CM | POA: Diagnosis not present

## 2022-09-11 DIAGNOSIS — M6281 Muscle weakness (generalized): Secondary | ICD-10-CM | POA: Diagnosis not present

## 2022-09-12 DIAGNOSIS — R2681 Unsteadiness on feet: Secondary | ICD-10-CM | POA: Diagnosis not present

## 2022-09-12 DIAGNOSIS — M6281 Muscle weakness (generalized): Secondary | ICD-10-CM | POA: Diagnosis not present

## 2022-09-12 DIAGNOSIS — R293 Abnormal posture: Secondary | ICD-10-CM | POA: Diagnosis not present

## 2022-09-12 DIAGNOSIS — R262 Difficulty in walking, not elsewhere classified: Secondary | ICD-10-CM | POA: Diagnosis not present

## 2022-09-12 DIAGNOSIS — J449 Chronic obstructive pulmonary disease, unspecified: Secondary | ICD-10-CM | POA: Diagnosis not present

## 2022-09-12 DIAGNOSIS — I5042 Chronic combined systolic (congestive) and diastolic (congestive) heart failure: Secondary | ICD-10-CM | POA: Diagnosis not present

## 2022-09-12 DIAGNOSIS — K7581 Nonalcoholic steatohepatitis (NASH): Secondary | ICD-10-CM | POA: Diagnosis not present

## 2022-09-12 DIAGNOSIS — G9341 Metabolic encephalopathy: Secondary | ICD-10-CM | POA: Diagnosis not present

## 2022-09-13 DIAGNOSIS — K7460 Unspecified cirrhosis of liver: Secondary | ICD-10-CM | POA: Diagnosis not present

## 2022-09-13 DIAGNOSIS — K7581 Nonalcoholic steatohepatitis (NASH): Secondary | ICD-10-CM | POA: Diagnosis not present

## 2022-09-13 DIAGNOSIS — R600 Localized edema: Secondary | ICD-10-CM | POA: Diagnosis not present

## 2022-09-13 DIAGNOSIS — I5042 Chronic combined systolic (congestive) and diastolic (congestive) heart failure: Secondary | ICD-10-CM | POA: Diagnosis not present

## 2022-09-13 DIAGNOSIS — M6281 Muscle weakness (generalized): Secondary | ICD-10-CM | POA: Diagnosis not present

## 2022-09-13 DIAGNOSIS — R293 Abnormal posture: Secondary | ICD-10-CM | POA: Diagnosis not present

## 2022-09-13 DIAGNOSIS — I10 Essential (primary) hypertension: Secondary | ICD-10-CM | POA: Diagnosis not present

## 2022-09-13 DIAGNOSIS — J449 Chronic obstructive pulmonary disease, unspecified: Secondary | ICD-10-CM | POA: Diagnosis not present

## 2022-09-13 DIAGNOSIS — R262 Difficulty in walking, not elsewhere classified: Secondary | ICD-10-CM | POA: Diagnosis not present

## 2022-09-13 DIAGNOSIS — R627 Adult failure to thrive: Secondary | ICD-10-CM | POA: Diagnosis not present

## 2022-09-13 DIAGNOSIS — G9341 Metabolic encephalopathy: Secondary | ICD-10-CM | POA: Diagnosis not present

## 2022-09-13 DIAGNOSIS — R2681 Unsteadiness on feet: Secondary | ICD-10-CM | POA: Diagnosis not present

## 2022-09-14 DIAGNOSIS — R2681 Unsteadiness on feet: Secondary | ICD-10-CM | POA: Diagnosis not present

## 2022-09-14 DIAGNOSIS — I87311 Chronic venous hypertension (idiopathic) with ulcer of right lower extremity: Secondary | ICD-10-CM | POA: Diagnosis not present

## 2022-09-14 DIAGNOSIS — Z6841 Body Mass Index (BMI) 40.0 and over, adult: Secondary | ICD-10-CM | POA: Diagnosis not present

## 2022-09-14 DIAGNOSIS — R262 Difficulty in walking, not elsewhere classified: Secondary | ICD-10-CM | POA: Diagnosis not present

## 2022-09-14 DIAGNOSIS — M6281 Muscle weakness (generalized): Secondary | ICD-10-CM | POA: Diagnosis not present

## 2022-09-14 DIAGNOSIS — G9341 Metabolic encephalopathy: Secondary | ICD-10-CM | POA: Diagnosis not present

## 2022-09-14 DIAGNOSIS — L97812 Non-pressure chronic ulcer of other part of right lower leg with fat layer exposed: Secondary | ICD-10-CM | POA: Diagnosis not present

## 2022-09-14 DIAGNOSIS — K7581 Nonalcoholic steatohepatitis (NASH): Secondary | ICD-10-CM | POA: Diagnosis not present

## 2022-09-14 DIAGNOSIS — I5042 Chronic combined systolic (congestive) and diastolic (congestive) heart failure: Secondary | ICD-10-CM | POA: Diagnosis not present

## 2022-09-14 DIAGNOSIS — R293 Abnormal posture: Secondary | ICD-10-CM | POA: Diagnosis not present

## 2022-09-14 DIAGNOSIS — J449 Chronic obstructive pulmonary disease, unspecified: Secondary | ICD-10-CM | POA: Diagnosis not present

## 2022-09-15 DIAGNOSIS — R293 Abnormal posture: Secondary | ICD-10-CM | POA: Diagnosis not present

## 2022-09-15 DIAGNOSIS — J449 Chronic obstructive pulmonary disease, unspecified: Secondary | ICD-10-CM | POA: Diagnosis not present

## 2022-09-15 DIAGNOSIS — K7581 Nonalcoholic steatohepatitis (NASH): Secondary | ICD-10-CM | POA: Diagnosis not present

## 2022-09-15 DIAGNOSIS — G9341 Metabolic encephalopathy: Secondary | ICD-10-CM | POA: Diagnosis not present

## 2022-09-15 DIAGNOSIS — I5042 Chronic combined systolic (congestive) and diastolic (congestive) heart failure: Secondary | ICD-10-CM | POA: Diagnosis not present

## 2022-09-15 DIAGNOSIS — R262 Difficulty in walking, not elsewhere classified: Secondary | ICD-10-CM | POA: Diagnosis not present

## 2022-09-15 DIAGNOSIS — R2681 Unsteadiness on feet: Secondary | ICD-10-CM | POA: Diagnosis not present

## 2022-09-15 DIAGNOSIS — M6281 Muscle weakness (generalized): Secondary | ICD-10-CM | POA: Diagnosis not present

## 2022-09-16 DIAGNOSIS — G9341 Metabolic encephalopathy: Secondary | ICD-10-CM | POA: Diagnosis not present

## 2022-09-16 DIAGNOSIS — R262 Difficulty in walking, not elsewhere classified: Secondary | ICD-10-CM | POA: Diagnosis not present

## 2022-09-16 DIAGNOSIS — M6281 Muscle weakness (generalized): Secondary | ICD-10-CM | POA: Diagnosis not present

## 2022-09-16 DIAGNOSIS — R293 Abnormal posture: Secondary | ICD-10-CM | POA: Diagnosis not present

## 2022-09-16 DIAGNOSIS — K7581 Nonalcoholic steatohepatitis (NASH): Secondary | ICD-10-CM | POA: Diagnosis not present

## 2022-09-16 DIAGNOSIS — I5042 Chronic combined systolic (congestive) and diastolic (congestive) heart failure: Secondary | ICD-10-CM | POA: Diagnosis not present

## 2022-09-16 DIAGNOSIS — J449 Chronic obstructive pulmonary disease, unspecified: Secondary | ICD-10-CM | POA: Diagnosis not present

## 2022-09-16 DIAGNOSIS — R2681 Unsteadiness on feet: Secondary | ICD-10-CM | POA: Diagnosis not present

## 2022-09-17 DIAGNOSIS — M6281 Muscle weakness (generalized): Secondary | ICD-10-CM | POA: Diagnosis not present

## 2022-09-17 DIAGNOSIS — R262 Difficulty in walking, not elsewhere classified: Secondary | ICD-10-CM | POA: Diagnosis not present

## 2022-09-17 DIAGNOSIS — K7581 Nonalcoholic steatohepatitis (NASH): Secondary | ICD-10-CM | POA: Diagnosis not present

## 2022-09-17 DIAGNOSIS — R2681 Unsteadiness on feet: Secondary | ICD-10-CM | POA: Diagnosis not present

## 2022-09-17 DIAGNOSIS — G9341 Metabolic encephalopathy: Secondary | ICD-10-CM | POA: Diagnosis not present

## 2022-09-17 DIAGNOSIS — R293 Abnormal posture: Secondary | ICD-10-CM | POA: Diagnosis not present

## 2022-09-17 DIAGNOSIS — I5042 Chronic combined systolic (congestive) and diastolic (congestive) heart failure: Secondary | ICD-10-CM | POA: Diagnosis not present

## 2022-09-17 DIAGNOSIS — J449 Chronic obstructive pulmonary disease, unspecified: Secondary | ICD-10-CM | POA: Diagnosis not present

## 2022-09-18 DIAGNOSIS — R262 Difficulty in walking, not elsewhere classified: Secondary | ICD-10-CM | POA: Diagnosis not present

## 2022-09-18 DIAGNOSIS — J449 Chronic obstructive pulmonary disease, unspecified: Secondary | ICD-10-CM | POA: Diagnosis not present

## 2022-09-18 DIAGNOSIS — R293 Abnormal posture: Secondary | ICD-10-CM | POA: Diagnosis not present

## 2022-09-18 DIAGNOSIS — R2681 Unsteadiness on feet: Secondary | ICD-10-CM | POA: Diagnosis not present

## 2022-09-18 DIAGNOSIS — M6281 Muscle weakness (generalized): Secondary | ICD-10-CM | POA: Diagnosis not present

## 2022-09-18 DIAGNOSIS — I5042 Chronic combined systolic (congestive) and diastolic (congestive) heart failure: Secondary | ICD-10-CM | POA: Diagnosis not present

## 2022-09-18 DIAGNOSIS — G9341 Metabolic encephalopathy: Secondary | ICD-10-CM | POA: Diagnosis not present

## 2022-09-18 DIAGNOSIS — K7581 Nonalcoholic steatohepatitis (NASH): Secondary | ICD-10-CM | POA: Diagnosis not present

## 2022-09-19 DIAGNOSIS — R2681 Unsteadiness on feet: Secondary | ICD-10-CM | POA: Diagnosis not present

## 2022-09-19 DIAGNOSIS — K7460 Unspecified cirrhosis of liver: Secondary | ICD-10-CM | POA: Diagnosis not present

## 2022-09-19 DIAGNOSIS — G894 Chronic pain syndrome: Secondary | ICD-10-CM | POA: Diagnosis not present

## 2022-09-19 DIAGNOSIS — I10 Essential (primary) hypertension: Secondary | ICD-10-CM | POA: Diagnosis not present

## 2022-09-19 DIAGNOSIS — R600 Localized edema: Secondary | ICD-10-CM | POA: Diagnosis not present

## 2022-09-19 DIAGNOSIS — R262 Difficulty in walking, not elsewhere classified: Secondary | ICD-10-CM | POA: Diagnosis not present

## 2022-09-19 DIAGNOSIS — R293 Abnormal posture: Secondary | ICD-10-CM | POA: Diagnosis not present

## 2022-09-19 DIAGNOSIS — K7581 Nonalcoholic steatohepatitis (NASH): Secondary | ICD-10-CM | POA: Diagnosis not present

## 2022-09-19 DIAGNOSIS — M6281 Muscle weakness (generalized): Secondary | ICD-10-CM | POA: Diagnosis not present

## 2022-09-19 DIAGNOSIS — G9341 Metabolic encephalopathy: Secondary | ICD-10-CM | POA: Diagnosis not present

## 2022-09-19 DIAGNOSIS — R627 Adult failure to thrive: Secondary | ICD-10-CM | POA: Diagnosis not present

## 2022-09-19 DIAGNOSIS — I5042 Chronic combined systolic (congestive) and diastolic (congestive) heart failure: Secondary | ICD-10-CM | POA: Diagnosis not present

## 2022-09-19 DIAGNOSIS — J449 Chronic obstructive pulmonary disease, unspecified: Secondary | ICD-10-CM | POA: Diagnosis not present

## 2022-09-20 DIAGNOSIS — E785 Hyperlipidemia, unspecified: Secondary | ICD-10-CM | POA: Diagnosis not present

## 2022-09-20 DIAGNOSIS — K7581 Nonalcoholic steatohepatitis (NASH): Secondary | ICD-10-CM | POA: Diagnosis not present

## 2022-09-20 DIAGNOSIS — R293 Abnormal posture: Secondary | ICD-10-CM | POA: Diagnosis not present

## 2022-09-20 DIAGNOSIS — D649 Anemia, unspecified: Secondary | ICD-10-CM | POA: Diagnosis not present

## 2022-09-20 DIAGNOSIS — M6281 Muscle weakness (generalized): Secondary | ICD-10-CM | POA: Diagnosis not present

## 2022-09-20 DIAGNOSIS — R2681 Unsteadiness on feet: Secondary | ICD-10-CM | POA: Diagnosis not present

## 2022-09-20 DIAGNOSIS — I5042 Chronic combined systolic (congestive) and diastolic (congestive) heart failure: Secondary | ICD-10-CM | POA: Diagnosis not present

## 2022-09-20 DIAGNOSIS — J449 Chronic obstructive pulmonary disease, unspecified: Secondary | ICD-10-CM | POA: Diagnosis not present

## 2022-09-20 DIAGNOSIS — G9341 Metabolic encephalopathy: Secondary | ICD-10-CM | POA: Diagnosis not present

## 2022-09-20 DIAGNOSIS — R262 Difficulty in walking, not elsewhere classified: Secondary | ICD-10-CM | POA: Diagnosis not present

## 2022-09-21 DIAGNOSIS — J449 Chronic obstructive pulmonary disease, unspecified: Secondary | ICD-10-CM | POA: Diagnosis not present

## 2022-09-21 DIAGNOSIS — R627 Adult failure to thrive: Secondary | ICD-10-CM | POA: Diagnosis not present

## 2022-09-21 DIAGNOSIS — K7581 Nonalcoholic steatohepatitis (NASH): Secondary | ICD-10-CM | POA: Diagnosis not present

## 2022-09-21 DIAGNOSIS — R600 Localized edema: Secondary | ICD-10-CM | POA: Diagnosis not present

## 2022-09-21 DIAGNOSIS — M6281 Muscle weakness (generalized): Secondary | ICD-10-CM | POA: Diagnosis not present

## 2022-09-21 DIAGNOSIS — R2681 Unsteadiness on feet: Secondary | ICD-10-CM | POA: Diagnosis not present

## 2022-09-21 DIAGNOSIS — L97222 Non-pressure chronic ulcer of left calf with fat layer exposed: Secondary | ICD-10-CM | POA: Diagnosis not present

## 2022-09-21 DIAGNOSIS — I87312 Chronic venous hypertension (idiopathic) with ulcer of left lower extremity: Secondary | ICD-10-CM | POA: Diagnosis not present

## 2022-09-21 DIAGNOSIS — R293 Abnormal posture: Secondary | ICD-10-CM | POA: Diagnosis not present

## 2022-09-21 DIAGNOSIS — Z6841 Body Mass Index (BMI) 40.0 and over, adult: Secondary | ICD-10-CM | POA: Diagnosis not present

## 2022-09-21 DIAGNOSIS — I5042 Chronic combined systolic (congestive) and diastolic (congestive) heart failure: Secondary | ICD-10-CM | POA: Diagnosis not present

## 2022-09-21 DIAGNOSIS — D638 Anemia in other chronic diseases classified elsewhere: Secondary | ICD-10-CM | POA: Diagnosis not present

## 2022-09-21 DIAGNOSIS — D696 Thrombocytopenia, unspecified: Secondary | ICD-10-CM | POA: Diagnosis not present

## 2022-09-21 DIAGNOSIS — G9341 Metabolic encephalopathy: Secondary | ICD-10-CM | POA: Diagnosis not present

## 2022-09-21 DIAGNOSIS — K7460 Unspecified cirrhosis of liver: Secondary | ICD-10-CM | POA: Diagnosis not present

## 2022-09-21 DIAGNOSIS — G894 Chronic pain syndrome: Secondary | ICD-10-CM | POA: Diagnosis not present

## 2022-09-21 DIAGNOSIS — I10 Essential (primary) hypertension: Secondary | ICD-10-CM | POA: Diagnosis not present

## 2022-09-21 DIAGNOSIS — R262 Difficulty in walking, not elsewhere classified: Secondary | ICD-10-CM | POA: Diagnosis not present

## 2022-09-22 ENCOUNTER — Emergency Department (HOSPITAL_COMMUNITY): Payer: Medicare HMO

## 2022-09-22 ENCOUNTER — Inpatient Hospital Stay (HOSPITAL_COMMUNITY)
Admission: EM | Admit: 2022-09-22 | Discharge: 2022-09-24 | DRG: 083 | Disposition: A | Discharge status: Skilled Nursing Facility | Payer: Medicare HMO | Source: Skilled Nursing Facility | Attending: Internal Medicine | Admitting: Internal Medicine

## 2022-09-22 DIAGNOSIS — M109 Gout, unspecified: Secondary | ICD-10-CM | POA: Diagnosis present

## 2022-09-22 DIAGNOSIS — J9811 Atelectasis: Secondary | ICD-10-CM | POA: Diagnosis not present

## 2022-09-22 DIAGNOSIS — Z6830 Body mass index (BMI) 30.0-30.9, adult: Secondary | ICD-10-CM

## 2022-09-22 DIAGNOSIS — R161 Splenomegaly, not elsewhere classified: Secondary | ICD-10-CM | POA: Diagnosis present

## 2022-09-22 DIAGNOSIS — I10 Essential (primary) hypertension: Secondary | ICD-10-CM | POA: Diagnosis present

## 2022-09-22 DIAGNOSIS — Z888 Allergy status to other drugs, medicaments and biological substances status: Secondary | ICD-10-CM

## 2022-09-22 DIAGNOSIS — I472 Ventricular tachycardia, unspecified: Secondary | ICD-10-CM | POA: Diagnosis not present

## 2022-09-22 DIAGNOSIS — M47812 Spondylosis without myelopathy or radiculopathy, cervical region: Secondary | ICD-10-CM | POA: Diagnosis present

## 2022-09-22 DIAGNOSIS — I252 Old myocardial infarction: Secondary | ICD-10-CM

## 2022-09-22 DIAGNOSIS — Z7902 Long term (current) use of antithrombotics/antiplatelets: Secondary | ICD-10-CM

## 2022-09-22 DIAGNOSIS — K7581 Nonalcoholic steatohepatitis (NASH): Secondary | ICD-10-CM | POA: Diagnosis not present

## 2022-09-22 DIAGNOSIS — R58 Hemorrhage, not elsewhere classified: Secondary | ICD-10-CM | POA: Diagnosis not present

## 2022-09-22 DIAGNOSIS — K029 Dental caries, unspecified: Secondary | ICD-10-CM | POA: Diagnosis present

## 2022-09-22 DIAGNOSIS — Z66 Do not resuscitate: Secondary | ICD-10-CM | POA: Diagnosis present

## 2022-09-22 DIAGNOSIS — D638 Anemia in other chronic diseases classified elsewhere: Secondary | ICD-10-CM | POA: Diagnosis present

## 2022-09-22 DIAGNOSIS — G894 Chronic pain syndrome: Secondary | ICD-10-CM | POA: Diagnosis present

## 2022-09-22 DIAGNOSIS — S065X0A Traumatic subdural hemorrhage without loss of consciousness, initial encounter: Secondary | ICD-10-CM | POA: Diagnosis not present

## 2022-09-22 DIAGNOSIS — I714 Abdominal aortic aneurysm, without rupture, unspecified: Secondary | ICD-10-CM | POA: Diagnosis present

## 2022-09-22 DIAGNOSIS — F332 Major depressive disorder, recurrent severe without psychotic features: Secondary | ICD-10-CM | POA: Diagnosis present

## 2022-09-22 DIAGNOSIS — Z7189 Other specified counseling: Secondary | ICD-10-CM | POA: Diagnosis not present

## 2022-09-22 DIAGNOSIS — Z7982 Long term (current) use of aspirin: Secondary | ICD-10-CM

## 2022-09-22 DIAGNOSIS — F17210 Nicotine dependence, cigarettes, uncomplicated: Secondary | ICD-10-CM | POA: Diagnosis present

## 2022-09-22 DIAGNOSIS — Z79891 Long term (current) use of opiate analgesic: Secondary | ICD-10-CM

## 2022-09-22 DIAGNOSIS — Z8261 Family history of arthritis: Secondary | ICD-10-CM

## 2022-09-22 DIAGNOSIS — Z043 Encounter for examination and observation following other accident: Secondary | ICD-10-CM | POA: Diagnosis not present

## 2022-09-22 DIAGNOSIS — D7589 Other specified diseases of blood and blood-forming organs: Secondary | ICD-10-CM | POA: Diagnosis present

## 2022-09-22 DIAGNOSIS — R293 Abnormal posture: Secondary | ICD-10-CM | POA: Diagnosis not present

## 2022-09-22 DIAGNOSIS — Z885 Allergy status to narcotic agent status: Secondary | ICD-10-CM

## 2022-09-22 DIAGNOSIS — N492 Inflammatory disorders of scrotum: Secondary | ICD-10-CM | POA: Diagnosis present

## 2022-09-22 DIAGNOSIS — E785 Hyperlipidemia, unspecified: Secondary | ICD-10-CM | POA: Diagnosis present

## 2022-09-22 DIAGNOSIS — M7989 Other specified soft tissue disorders: Secondary | ICD-10-CM | POA: Diagnosis not present

## 2022-09-22 DIAGNOSIS — Y92009 Unspecified place in unspecified non-institutional (private) residence as the place of occurrence of the external cause: Secondary | ICD-10-CM

## 2022-09-22 DIAGNOSIS — I5042 Chronic combined systolic (congestive) and diastolic (congestive) heart failure: Secondary | ICD-10-CM | POA: Diagnosis present

## 2022-09-22 DIAGNOSIS — S065XAA Traumatic subdural hemorrhage with loss of consciousness status unknown, initial encounter: Secondary | ICD-10-CM | POA: Diagnosis present

## 2022-09-22 DIAGNOSIS — M549 Dorsalgia, unspecified: Secondary | ICD-10-CM | POA: Diagnosis not present

## 2022-09-22 DIAGNOSIS — Z23 Encounter for immunization: Secondary | ICD-10-CM

## 2022-09-22 DIAGNOSIS — J449 Chronic obstructive pulmonary disease, unspecified: Secondary | ICD-10-CM | POA: Diagnosis present

## 2022-09-22 DIAGNOSIS — Z515 Encounter for palliative care: Secondary | ICD-10-CM

## 2022-09-22 DIAGNOSIS — Z841 Family history of disorders of kidney and ureter: Secondary | ICD-10-CM

## 2022-09-22 DIAGNOSIS — E119 Type 2 diabetes mellitus without complications: Secondary | ICD-10-CM | POA: Diagnosis present

## 2022-09-22 DIAGNOSIS — I6200 Nontraumatic subdural hemorrhage, unspecified: Secondary | ICD-10-CM | POA: Diagnosis not present

## 2022-09-22 DIAGNOSIS — K7210 Chronic hepatic failure without coma: Secondary | ICD-10-CM | POA: Diagnosis present

## 2022-09-22 DIAGNOSIS — Z79899 Other long term (current) drug therapy: Secondary | ICD-10-CM

## 2022-09-22 DIAGNOSIS — Z7401 Bed confinement status: Secondary | ICD-10-CM | POA: Diagnosis not present

## 2022-09-22 DIAGNOSIS — Z8249 Family history of ischemic heart disease and other diseases of the circulatory system: Secondary | ICD-10-CM

## 2022-09-22 DIAGNOSIS — G600 Hereditary motor and sensory neuropathy: Secondary | ICD-10-CM | POA: Diagnosis present

## 2022-09-22 DIAGNOSIS — Z789 Other specified health status: Secondary | ICD-10-CM | POA: Diagnosis not present

## 2022-09-22 DIAGNOSIS — R262 Difficulty in walking, not elsewhere classified: Secondary | ICD-10-CM | POA: Diagnosis not present

## 2022-09-22 DIAGNOSIS — I8510 Secondary esophageal varices without bleeding: Secondary | ICD-10-CM | POA: Diagnosis present

## 2022-09-22 DIAGNOSIS — D696 Thrombocytopenia, unspecified: Secondary | ICD-10-CM | POA: Diagnosis present

## 2022-09-22 DIAGNOSIS — I959 Hypotension, unspecified: Secondary | ICD-10-CM | POA: Diagnosis not present

## 2022-09-22 DIAGNOSIS — I255 Ischemic cardiomyopathy: Secondary | ICD-10-CM | POA: Diagnosis present

## 2022-09-22 DIAGNOSIS — S0003XA Contusion of scalp, initial encounter: Secondary | ICD-10-CM | POA: Diagnosis not present

## 2022-09-22 DIAGNOSIS — Z833 Family history of diabetes mellitus: Secondary | ICD-10-CM

## 2022-09-22 DIAGNOSIS — E876 Hypokalemia: Secondary | ICD-10-CM | POA: Diagnosis present

## 2022-09-22 DIAGNOSIS — I110 Hypertensive heart disease with heart failure: Secondary | ICD-10-CM | POA: Diagnosis present

## 2022-09-22 DIAGNOSIS — G4733 Obstructive sleep apnea (adult) (pediatric): Secondary | ICD-10-CM | POA: Diagnosis present

## 2022-09-22 DIAGNOSIS — S0121XA Laceration without foreign body of nose, initial encounter: Secondary | ICD-10-CM

## 2022-09-22 DIAGNOSIS — S0992XA Unspecified injury of nose, initial encounter: Secondary | ICD-10-CM | POA: Diagnosis not present

## 2022-09-22 DIAGNOSIS — T1490XA Injury, unspecified, initial encounter: Secondary | ICD-10-CM | POA: Diagnosis not present

## 2022-09-22 DIAGNOSIS — N179 Acute kidney failure, unspecified: Secondary | ICD-10-CM | POA: Diagnosis present

## 2022-09-22 DIAGNOSIS — E871 Hypo-osmolality and hyponatremia: Secondary | ICD-10-CM | POA: Diagnosis present

## 2022-09-22 DIAGNOSIS — M6281 Muscle weakness (generalized): Secondary | ICD-10-CM | POA: Diagnosis not present

## 2022-09-22 DIAGNOSIS — R2681 Unsteadiness on feet: Secondary | ICD-10-CM | POA: Diagnosis not present

## 2022-09-22 DIAGNOSIS — K7682 Hepatic encephalopathy: Secondary | ICD-10-CM | POA: Diagnosis present

## 2022-09-22 DIAGNOSIS — K766 Portal hypertension: Secondary | ICD-10-CM | POA: Diagnosis present

## 2022-09-22 DIAGNOSIS — Z880 Allergy status to penicillin: Secondary | ICD-10-CM

## 2022-09-22 DIAGNOSIS — F17200 Nicotine dependence, unspecified, uncomplicated: Secondary | ICD-10-CM | POA: Diagnosis present

## 2022-09-22 DIAGNOSIS — K7460 Unspecified cirrhosis of liver: Secondary | ICD-10-CM | POA: Diagnosis present

## 2022-09-22 DIAGNOSIS — N503 Cyst of epididymis: Secondary | ICD-10-CM | POA: Diagnosis not present

## 2022-09-22 DIAGNOSIS — W19XXXA Unspecified fall, initial encounter: Secondary | ICD-10-CM

## 2022-09-22 DIAGNOSIS — R531 Weakness: Secondary | ICD-10-CM | POA: Diagnosis not present

## 2022-09-22 DIAGNOSIS — I2510 Atherosclerotic heart disease of native coronary artery without angina pectoris: Secondary | ICD-10-CM | POA: Diagnosis not present

## 2022-09-22 DIAGNOSIS — G9341 Metabolic encephalopathy: Secondary | ICD-10-CM | POA: Diagnosis not present

## 2022-09-22 DIAGNOSIS — K219 Gastro-esophageal reflux disease without esophagitis: Secondary | ICD-10-CM | POA: Diagnosis present

## 2022-09-22 DIAGNOSIS — Z981 Arthrodesis status: Secondary | ICD-10-CM

## 2022-09-22 DIAGNOSIS — Z955 Presence of coronary angioplasty implant and graft: Secondary | ICD-10-CM

## 2022-09-22 DIAGNOSIS — M4800 Spinal stenosis, site unspecified: Secondary | ICD-10-CM | POA: Diagnosis present

## 2022-09-22 LAB — I-STAT CHEM 8, ED
BUN: 41 mg/dL — ABNORMAL HIGH (ref 8–23)
Calcium, Ion: 1.04 mmol/L — ABNORMAL LOW (ref 1.15–1.40)
Chloride: 92 mmol/L — ABNORMAL LOW (ref 98–111)
Creatinine, Ser: 1.7 mg/dL — ABNORMAL HIGH (ref 0.61–1.24)
Glucose, Bld: 132 mg/dL — ABNORMAL HIGH (ref 70–99)
HCT: 35 % — ABNORMAL LOW (ref 39.0–52.0)
Hemoglobin: 11.9 g/dL — ABNORMAL LOW (ref 13.0–17.0)
Potassium: 4.1 mmol/L (ref 3.5–5.1)
Sodium: 134 mmol/L — ABNORMAL LOW (ref 135–145)
TCO2: 36 mmol/L — ABNORMAL HIGH (ref 22–32)

## 2022-09-22 LAB — COMPREHENSIVE METABOLIC PANEL
ALT: 23 U/L (ref 0–44)
AST: 46 U/L — ABNORMAL HIGH (ref 15–41)
Albumin: 2.2 g/dL — ABNORMAL LOW (ref 3.5–5.0)
Alkaline Phosphatase: 165 U/L — ABNORMAL HIGH (ref 38–126)
Anion gap: 10 (ref 5–15)
BUN: 30 mg/dL — ABNORMAL HIGH (ref 8–23)
CO2: 29 mmol/L (ref 22–32)
Calcium: 8.3 mg/dL — ABNORMAL LOW (ref 8.9–10.3)
Chloride: 93 mmol/L — ABNORMAL LOW (ref 98–111)
Creatinine, Ser: 1.6 mg/dL — ABNORMAL HIGH (ref 0.61–1.24)
GFR, Estimated: 47 mL/min — ABNORMAL LOW (ref >60)
Glucose, Bld: 135 mg/dL — ABNORMAL HIGH (ref 70–99)
Potassium: 3.7 mmol/L (ref 3.5–5.1)
Sodium: 132 mmol/L — ABNORMAL LOW (ref 135–145)
Total Bilirubin: 2.2 mg/dL — ABNORMAL HIGH (ref 0.3–1.2)
Total Protein: 8.4 g/dL — ABNORMAL HIGH (ref 6.5–8.1)

## 2022-09-22 LAB — CBC
HCT: 32.6 % — ABNORMAL LOW (ref 39.0–52.0)
Hemoglobin: 11.1 g/dL — ABNORMAL LOW (ref 13.0–17.0)
MCH: 35.5 pg — ABNORMAL HIGH (ref 26.0–34.0)
MCHC: 34 g/dL (ref 30.0–36.0)
MCV: 104.2 fL — ABNORMAL HIGH (ref 80.0–100.0)
Platelets: 50 10*3/uL — ABNORMAL LOW (ref 150–400)
RBC: 3.13 MIL/uL — ABNORMAL LOW (ref 4.22–5.81)
RDW: 16.6 % — ABNORMAL HIGH (ref 11.5–15.5)
WBC: 6.8 10*3/uL (ref 4.0–10.5)
nRBC: 0 % (ref 0.0–0.2)

## 2022-09-22 LAB — ETHANOL: Alcohol, Ethyl (B): <10 mg/dL (ref <10)

## 2022-09-22 LAB — URINALYSIS, ROUTINE W REFLEX MICROSCOPIC
Bilirubin Urine: NEGATIVE
Glucose, UA: NEGATIVE mg/dL
Ketones, ur: NEGATIVE mg/dL
Nitrite: NEGATIVE
Protein, ur: NEGATIVE mg/dL
Specific Gravity, Urine: 1.011 (ref 1.005–1.030)
pH: 7 (ref 5.0–8.0)

## 2022-09-22 LAB — SAMPLE TO BLOOD BANK

## 2022-09-22 LAB — PROTIME-INR
INR: 1.4 — ABNORMAL HIGH (ref 0.8–1.2)
Prothrombin Time: 16.6 seconds — ABNORMAL HIGH (ref 11.4–15.2)

## 2022-09-22 LAB — LACTIC ACID, PLASMA: Lactic Acid, Venous: 2.6 mmol/L (ref 0.5–1.9)

## 2022-09-22 MED ORDER — METOPROLOL TARTRATE 5 MG/5ML IV SOLN: 5.0000 mg | Freq: Four times a day (QID) | INTRAVENOUS | Status: DC | PRN

## 2022-09-22 MED ORDER — GABAPENTIN 300 MG PO CAPS
300.0000 mg | ORAL_CAPSULE | Freq: Two times a day (BID) | ORAL | Status: DC
  Administered 2022-09-22 – 2022-09-24 (×4): 300 mg via ORAL
  Filled 2022-09-22 (×4): qty 1

## 2022-09-22 MED ORDER — ALBUTEROL SULFATE (2.5 MG/3ML) 0.083% IN NEBU: 2.5000 mg | INHALATION_SOLUTION | Freq: Four times a day (QID) | RESPIRATORY_TRACT | Status: DC | PRN

## 2022-09-22 MED ORDER — POLYETHYLENE GLYCOL 3350 17 G PO PACK: 17.0000 g | PACK | Freq: Every day | ORAL | Status: DC | PRN

## 2022-09-22 MED ORDER — PANTOPRAZOLE SODIUM 40 MG PO TBEC
40.0000 mg | DELAYED_RELEASE_TABLET | Freq: Every day | ORAL | Status: DC
  Administered 2022-09-23 – 2022-09-24 (×2): 40 mg via ORAL
  Filled 2022-09-22 (×2): qty 1

## 2022-09-22 MED ORDER — FOLIC ACID 1 MG PO TABS
1.0000 mg | ORAL_TABLET | Freq: Every day | ORAL | Status: DC
  Administered 2022-09-23 – 2022-09-24 (×2): 1 mg via ORAL
  Filled 2022-09-22 (×2): qty 1

## 2022-09-22 MED ORDER — FUROSEMIDE 40 MG PO TABS
80.0000 mg | ORAL_TABLET | Freq: Every day | ORAL | Status: DC
  Administered 2022-09-22 – 2022-09-24 (×3): 80 mg via ORAL
  Filled 2022-09-22: qty 4
  Filled 2022-09-22: qty 2
  Filled 2022-09-22: qty 4

## 2022-09-22 MED ORDER — FENTANYL 75 MCG/HR TD PT72
1.0000 | MEDICATED_PATCH | TRANSDERMAL | Status: DC
  Filled 2022-09-22: qty 1

## 2022-09-22 MED ORDER — NICOTINE 14 MG/24HR TD PT24
14.0000 mg | MEDICATED_PATCH | Freq: Every day | TRANSDERMAL | Status: DC
  Administered 2022-09-23 – 2022-09-24 (×3): 14 mg via TRANSDERMAL
  Filled 2022-09-22 (×2): qty 1

## 2022-09-22 MED ORDER — FENTANYL CITRATE PF 50 MCG/ML IJ SOSY
50.0000 ug | PREFILLED_SYRINGE | Freq: Once | INTRAMUSCULAR | Status: AC
  Administered 2022-09-22: 50 ug via INTRAVENOUS
  Filled 2022-09-22: qty 1

## 2022-09-22 MED ORDER — FERROUS SULFATE 325 (65 FE) MG PO TABS
325.0000 mg | ORAL_TABLET | ORAL | Status: DC
  Administered 2022-09-23: 325 mg via ORAL
  Filled 2022-09-22: qty 1

## 2022-09-22 MED ORDER — ONDANSETRON HCL 4 MG PO TABS
4.0000 mg | ORAL_TABLET | Freq: Three times a day (TID) | ORAL | Status: DC | PRN
  Filled 2022-09-22: qty 1

## 2022-09-22 MED ORDER — SODIUM CHLORIDE 0.9 % IV SOLN: 250.0000 mL | INTRAVENOUS | Status: DC | PRN

## 2022-09-22 MED ORDER — OXYMETAZOLINE HCL 0.05 % NA SOLN
2.0000 | Freq: Once | NASAL | Status: AC
  Administered 2022-09-22: 2 via NASAL
  Filled 2022-09-22: qty 30

## 2022-09-22 MED ORDER — TETANUS-DIPHTH-ACELL PERTUSSIS 5-2.5-18.5 LF-MCG/0.5 IM SUSY
0.5000 mL | PREFILLED_SYRINGE | Freq: Once | INTRAMUSCULAR | Status: AC
  Administered 2022-09-22: 0.5 mL via INTRAMUSCULAR
  Filled 2022-09-22: qty 0.5

## 2022-09-22 MED ORDER — NITROGLYCERIN 0.4 MG SL SUBL: 0.4000 mg | SUBLINGUAL_TABLET | SUBLINGUAL | Status: DC | PRN

## 2022-09-22 MED ORDER — UMECLIDINIUM BROMIDE 62.5 MCG/ACT IN AEPB
1.0000 | INHALATION_SPRAY | Freq: Every day | RESPIRATORY_TRACT | Status: DC
  Filled 2022-09-22: qty 7

## 2022-09-22 MED ORDER — VITAMIN B-12 1000 MCG PO TABS
500.0000 ug | ORAL_TABLET | ORAL | Status: DC
  Administered 2022-09-22: 500 ug via ORAL
  Filled 2022-09-22: qty 1

## 2022-09-22 MED ORDER — INSULIN ASPART 100 UNIT/ML IJ SOLN
0.0000 [IU] | Freq: Three times a day (TID) | INTRAMUSCULAR | Status: DC
  Administered 2022-09-23 – 2022-09-24 (×2): 2 [IU] via SUBCUTANEOUS

## 2022-09-22 MED ORDER — IOHEXOL 350 MG/ML SOLN
75.0000 mL | Freq: Once | INTRAVENOUS | Status: AC | PRN
  Administered 2022-09-22: 75 mL via INTRAVENOUS

## 2022-09-22 MED ORDER — LACTULOSE 10 GM/15ML PO SOLN
20.0000 g | Freq: Four times a day (QID) | ORAL | Status: DC
  Administered 2022-09-22 – 2022-09-24 (×5): 20 g via ORAL
  Filled 2022-09-22 (×5): qty 30

## 2022-09-22 MED ORDER — RIFAXIMIN 550 MG PO TABS
550.0000 mg | ORAL_TABLET | Freq: Two times a day (BID) | ORAL | Status: DC
  Administered 2022-09-22 – 2022-09-24 (×4): 550 mg via ORAL
  Filled 2022-09-22 (×4): qty 1

## 2022-09-22 MED ORDER — THIAMINE MONONITRATE 100 MG PO TABS
100.0000 mg | ORAL_TABLET | Freq: Every day | ORAL | Status: DC
  Administered 2022-09-22 – 2022-09-24 (×3): 100 mg via ORAL
  Filled 2022-09-22 (×3): qty 1

## 2022-09-22 MED ORDER — OXYCODONE HCL 5 MG PO TABS
5.0000 mg | ORAL_TABLET | Freq: Three times a day (TID) | ORAL | Status: DC | PRN
  Administered 2022-09-23 (×2): 5 mg via ORAL
  Filled 2022-09-22 (×2): qty 1

## 2022-09-22 MED ORDER — FENTANYL CITRATE PF 50 MCG/ML IJ SOSY
12.5000 ug | PREFILLED_SYRINGE | INTRAMUSCULAR | Status: DC | PRN
  Administered 2022-09-23 (×2): 50 ug via INTRAVENOUS
  Filled 2022-09-22 (×3): qty 1

## 2022-09-22 MED ORDER — LACTATED RINGERS IV BOLUS
500.0000 mL | Freq: Once | INTRAVENOUS | Status: AC
  Administered 2022-09-22: 500 mL via INTRAVENOUS

## 2022-09-22 MED ORDER — SPIRONOLACTONE 25 MG PO TABS
50.0000 mg | ORAL_TABLET | Freq: Every day | ORAL | Status: DC
  Administered 2022-09-23 – 2022-09-24 (×2): 50 mg via ORAL
  Filled 2022-09-22 (×2): qty 2

## 2022-09-22 MED ORDER — TART CHERRY ADVANCED PO CAPS: 1.0000 | ORAL_CAPSULE | Freq: Two times a day (BID) | ORAL | Status: DC

## 2022-09-22 MED ORDER — SODIUM CHLORIDE 0.9% FLUSH: 3.0000 mL | INTRAVENOUS | Status: DC | PRN

## 2022-09-22 MED ORDER — SODIUM CHLORIDE 0.9% FLUSH
3.0000 mL | Freq: Two times a day (BID) | INTRAVENOUS | Status: DC
  Administered 2022-09-22 – 2022-09-24 (×2): 3 mL via INTRAVENOUS

## 2022-09-22 NOTE — ED Notes (Signed)
Wound cleaned, steri strips at bedside for provider

## 2022-09-22 NOTE — H&P (Signed)
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 Other: None Musculoskeletal: Old L4 compression fracture. Postsurgical changes of the lower lumbar laminectomy. Total right hip arthroplasty. No acute osseous pathology. IMPRESSION: 1. No acute/traumatic intrathoracic, abdominal, or pelvic pathology. 2. Cirrhosis with portal hypertension, splenomegaly, and upper abdominal varices. 3. Status post prior aorto bi iliac endovascular stent graft repair. 4.  Aortic Atherosclerosis (ICD10-I70.0). Electronically Signed   By: Elgie Collard M.D.   On: 09/22/2022 18:38   DG Hand Complete Right  Result Date: 09/22/2022 CLINICAL DATA:  Ground level fall EXAM: RIGHT HAND - COMPLETE 3+ VIEW; LEFT HAND - COMPLETE 3+ VIEW COMPARISON:  None Available. FINDINGS: There is no evidence of fracture or dislocation. Mild arthritis in the IP joints. Soft tissues are unremarkable. IMPRESSION: No acute fracture or dislocation in the bilateral hands. Electronically Signed   By: Minerva Fester M.D.   On: 09/22/2022 17:52   DG Hand Complete Left  Result Date:  09/22/2022 CLINICAL DATA:  Ground level fall EXAM: RIGHT HAND - COMPLETE 3+ VIEW; LEFT HAND - COMPLETE 3+ VIEW COMPARISON:  None Available. FINDINGS: There is no evidence of fracture or dislocation. Mild arthritis in the IP joints. Soft tissues are unremarkable. IMPRESSION: No acute fracture or dislocation in the bilateral hands. Electronically Signed   By: Minerva Fester M.D.   On: 09/22/2022 17:52   DG Pelvis Portable  Result Date: 09/22/2022 CLINICAL DATA:  Ground level fall. EXAM: PORTABLE PELVIS 1-2 VIEWS COMPARISON:  CT abdomen and pelvis 05/04/2022 FINDINGS: Body habitus limits assessment. Right THA. No evidence of fracture. No dislocation. Vascular stents. IMPRESSION: No definite acute fracture. Electronically Signed   By: Minerva Fester M.D.   On: 09/22/2022 17:50   DG Chest Port 1 View  Result Date: 09/22/2022 CLINICAL DATA:  Trauma, ground level fall EXAM: PORTABLE CHEST 1 VIEW COMPARISON:  04/15/2022 FINDINGS: Left chest wall ICD. Enlarged cardiomediastinal silhouette. Pulmonary vascular congestion. No focal consolidation, pleural effusion, or pneumothorax. Cervical spine fusion hardware. No definite displaced fractures. IMPRESSION: Cardiomegaly with pulmonary vascular congestion. Electronically Signed   By: Minerva Fester M.D.   On: 09/22/2022 17:49     Assessment and Plan: * Subdural hematoma Small less than 4 mm Neurosurgery consulted by EDP and recommended repeat in 6 hours.  No intervention if stable.  Cellulitis, scrotum Patient reports several open wounds here Wound care consult were ordered  MDD (major depressive disorder), recurrent severe, without psychosis Not on any meds at this time, monitor  CAD (coronary artery disease) On nitroglycerin    Smoker Not actively smoking but prefers to remain on nicotine patch.  This has been provided  HLD (hyperlipidemia) Low-cholesterol diet Statin intolerance  Cirrhosis of liver not due to alcohol Unclear of exact  diagnosis question autoimmune Currently stable  Chronic combined systolic and diastolic heart failure Continue Aldactone Continue Lasix  COPD (chronic obstructive pulmonary disease) Continue Incruse Ellipta Albuterol as needed  Thrombocytopenia Likely due to splenomegaly and end-stage liver disease Stable platelet count of 50 No evidence of significant bleeding  Portal hypertension Has splenomegaly, thrombocytopenia and esophageal varices noted on CT scan. On lactulose daily to prevent encephalopathy.  Fall at home, initial encounter Reportedly tripped over a sock. Gets around with a rollator Consider PT if needed  Cervical spondylosis Seen again on imaging This is chronic and not an acute issue.  Charcot-Marie-Tooth disease Likely related to his chronic pain. On fentanyl patches daily Oxycodone as needed Gabapentin  Obesity, morbid, BMI 40.0-49.9 Could benefit from healthy diet  OSA (obstructive sleep apnea)-resolved as of 09/22/2022 Is not clear he is on CPAP      Advance Care Planning:   Code Status: Prior patient has previously been a DNR.  He specifically asked to not be a DNR during this hospitalization.  This can be rereviewed as an outpatient once he goes back to the SNF.  Consults: Wound, Neurosurgery by phone only in ED  Family Communication: Patient at bedside  Severity of Illness: The appropriate patient status for this patient is OBSERVATION. Observation status is judged to be reasonable and necessary in order to provide the required intensity of service to ensure the patient's safety. The patient's presenting symptoms, physical exam findings, and initial radiographic and laboratory data in the context of their medical condition is felt to place them at decreased risk for further clinical deterioration. Furthermore, it is anticipated that the patient will be medically stable for discharge from the hospital within 2 midnights of admission.    Author: Reva Bores, MD 09/22/2022 7:38 PM  For on call review [email or web address].

## 2022-09-22 NOTE — Assessment & Plan Note (Signed)
Could benefit from healthy diet

## 2022-09-22 NOTE — Assessment & Plan Note (Addendum)
On nitroglycerin

## 2022-09-22 NOTE — Assessment & Plan Note (Addendum)
Has splenomegaly, thrombocytopenia and esophageal varices noted on CT scan. On lactulose daily to prevent encephalopathy.

## 2022-09-22 NOTE — Assessment & Plan Note (Signed)
Not actively smoking but prefers to remain on nicotine patch.  This has been provided

## 2022-09-22 NOTE — Assessment & Plan Note (Signed)
Not on any meds at this time, monitor

## 2022-09-22 NOTE — Assessment & Plan Note (Signed)
Patient reports several open wounds here Wound care consult were ordered

## 2022-09-22 NOTE — Assessment & Plan Note (Signed)
Small less than 4 mm Neurosurgery consulted by EDP and recommended repeat in 6 hours.  No intervention if stable.

## 2022-09-22 NOTE — ED Notes (Signed)
Pt has laceration to bridge of nose.  Blood around lips and missing teeth.  Abrasions bilaterally to knees.  Bruising to chest area and bilateral hands.  Tenderness upon palpation by MD Elpidio Anis to lumbar area.

## 2022-09-22 NOTE — Discharge Instructions (Signed)
**  Hold Plavix for 2 weeks**

## 2022-09-22 NOTE — Assessment & Plan Note (Signed)
Seen again on imaging This is chronic and not an acute issue.

## 2022-09-22 NOTE — Assessment & Plan Note (Signed)
Likely due to splenomegaly and end-stage liver disease Stable platelet count of 50 No evidence of significant bleeding

## 2022-09-22 NOTE — Assessment & Plan Note (Signed)
Reportedly tripped over a sock. Gets around with a rollator Consider PT if needed

## 2022-09-22 NOTE — Assessment & Plan Note (Signed)
Low-cholesterol diet Statin intolerance

## 2022-09-22 NOTE — ED Triage Notes (Signed)
Pt BIB [facility] from [facility] due to falling over walkers.  Pt does take Plavix.  Pt fell face forward and has laceration to bridge of nose.  There is blood around lips and some teeth missing.

## 2022-09-22 NOTE — Hospital Course (Signed)
Patient is a 67 year old patient with cirrhosis not alcohol related, COPD, history of coronary artery disease with MI, HTN, AAA, CHF, T2DM, spinal stenosis who presents with fall over his walker at [facility].  He hit his face and had significant bleeding.  In the ED was noted to have an acute kidney injury with a creatinine of 1.70 lactic acid 2.6 and INR of 1.6 and elevated alk phos at 165 and an elevated T. bili at 2.2.  He had a CT scan that showed a 4 mm subdural hematoma.  Neurosurgery was consulted and said to repeat the scan in 6 hours with no intervention if it remains stable.  He denies significant headache.  He does report significant pain which is ongoing.  He has a chronic pain syndrome and is on chronic narcotics daily.

## 2022-09-22 NOTE — Assessment & Plan Note (Addendum)
Likely related to his chronic pain. On fentanyl patches daily Oxycodone as needed Gabapentin

## 2022-09-22 NOTE — Assessment & Plan Note (Signed)
Continue Incruse Ellipta Albuterol as needed

## 2022-09-22 NOTE — Assessment & Plan Note (Signed)
Continue Aldactone Continue Lasix

## 2022-09-22 NOTE — ED Provider Notes (Signed)
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 Course/ Medical Decision Making/ A&P Clinical Course as of 09/23/22 0142  Fri Sep 22, 2022  1854 I was never called by radiology.  On personal review of patient's reads of CTs there was evidence of 4 mm subdural hematoma without mass effect.  No other acute traumatic injuries on pan scans.  There was concern for severe cervical stenosis however no reported active myelopathy symptoms. [VB]  87 Spoke with on-call neurosurgery team Dr Jordan Likes they are recommending repeat scan in 6 hours and if stable no further intervention from their standpoint.  They will act as consulting team. [VB]  1610 S/w Dr Shawnie Pons for admission for AKI possible hepatorenal syndrome, continued monitoring SDH with rpt scan.  [VB]    Clinical Course User Index [VB] Mardene Sayer, MD                             Medical Decision Making  KAREM TOMASO is a 68 y.o. male.  With PMH of DM2, CAD on Plavix, CHF, cirrhosis, COPD who was brought in by EMS from home after a fall over his walker with associated head injury denying loss of consciousness.     Patient's labs reviewed by me notable for AKI creatinine 1.6 (baseline of approximately 1 with elevated BUN 38,  hyponatremia 132, hypochloremia 93.  Has elevated total bilirubin 2.2, chronic anemia hemoglobin 11.1, chronic thrombocytopenia platelet count 50 consistent with known cirrhosis.  Gave patient fluid bolus for possible prerenal AKI but also consider developing hepatorenal or cardiorenal syndrome.  Patient had pan scan performed due to thrombocytopenia, Plavix use and diffuse bruising which likely is chronic in nature.  I was never called by radiology.  On personal review of patient's reads of CTs there was evidence of 4 mm subdural hematoma without mass effect.  No other acute traumatic injuries on pan scans.  There was concern for severe cervical stenosis however no reported active myelopathy symptoms. [VB] Bps remain low.  Spoke with on-call neurosurgery team Dr Jordan Likes they are recommending repeat scan in 6 hours and if stable no further intervention from their standpoint.  They will act as consulting team. [VB]  S/w Dr Shawnie Pons for admission for AKI possible hepatorenal syndrome, continued monitoring SDH with rpt scan.  [VB]   Amount and/or Complexity of Data Reviewed Labs: ordered. Radiology: ordered.  Risk OTC drugs. Prescription drug management. Decision regarding hospitalization.      Final Clinical Impression(s) / ED Diagnoses Final diagnoses:  Fall, initial encounter  Subdural hematoma  Laceration of nose, initial encounter    Rx / DC Orders ED Discharge Orders     None         Mardene Sayer, MD 09/23/22 [phone]

## 2022-09-22 NOTE — Assessment & Plan Note (Signed)
Is not clear he is on CPAP

## 2022-09-22 NOTE — Assessment & Plan Note (Signed)
Unclear of exact diagnosis question autoimmune Currently stable

## 2022-09-23 ENCOUNTER — Observation Stay (HOSPITAL_COMMUNITY): Payer: Medicare HMO

## 2022-09-23 ENCOUNTER — Other Ambulatory Visit: Payer: Self-pay

## 2022-09-23 DIAGNOSIS — Z515 Encounter for palliative care: Secondary | ICD-10-CM | POA: Diagnosis not present

## 2022-09-23 DIAGNOSIS — F17210 Nicotine dependence, cigarettes, uncomplicated: Secondary | ICD-10-CM | POA: Diagnosis present

## 2022-09-23 DIAGNOSIS — S0121XA Laceration without foreign body of nose, initial encounter: Secondary | ICD-10-CM

## 2022-09-23 DIAGNOSIS — I714 Abdominal aortic aneurysm, without rupture, unspecified: Secondary | ICD-10-CM | POA: Diagnosis present

## 2022-09-23 DIAGNOSIS — K7210 Chronic hepatic failure without coma: Secondary | ICD-10-CM | POA: Diagnosis present

## 2022-09-23 DIAGNOSIS — N179 Acute kidney failure, unspecified: Secondary | ICD-10-CM | POA: Diagnosis present

## 2022-09-23 DIAGNOSIS — N492 Inflammatory disorders of scrotum: Secondary | ICD-10-CM | POA: Diagnosis not present

## 2022-09-23 DIAGNOSIS — Z66 Do not resuscitate: Secondary | ICD-10-CM | POA: Diagnosis present

## 2022-09-23 DIAGNOSIS — S065XAA Traumatic subdural hemorrhage with loss of consciousness status unknown, initial encounter: Secondary | ICD-10-CM

## 2022-09-23 DIAGNOSIS — D638 Anemia in other chronic diseases classified elsewhere: Secondary | ICD-10-CM | POA: Diagnosis present

## 2022-09-23 DIAGNOSIS — Y92009 Unspecified place in unspecified non-institutional (private) residence as the place of occurrence of the external cause: Secondary | ICD-10-CM | POA: Diagnosis not present

## 2022-09-23 DIAGNOSIS — W19XXXA Unspecified fall, initial encounter: Secondary | ICD-10-CM | POA: Diagnosis not present

## 2022-09-23 DIAGNOSIS — K766 Portal hypertension: Secondary | ICD-10-CM | POA: Diagnosis present

## 2022-09-23 DIAGNOSIS — E119 Type 2 diabetes mellitus without complications: Secondary | ICD-10-CM | POA: Diagnosis present

## 2022-09-23 DIAGNOSIS — D696 Thrombocytopenia, unspecified: Secondary | ICD-10-CM | POA: Diagnosis present

## 2022-09-23 DIAGNOSIS — Z789 Other specified health status: Secondary | ICD-10-CM

## 2022-09-23 DIAGNOSIS — Z23 Encounter for immunization: Secondary | ICD-10-CM | POA: Diagnosis present

## 2022-09-23 DIAGNOSIS — N503 Cyst of epididymis: Secondary | ICD-10-CM | POA: Diagnosis not present

## 2022-09-23 DIAGNOSIS — I5042 Chronic combined systolic (congestive) and diastolic (congestive) heart failure: Secondary | ICD-10-CM | POA: Diagnosis present

## 2022-09-23 DIAGNOSIS — G894 Chronic pain syndrome: Secondary | ICD-10-CM | POA: Diagnosis present

## 2022-09-23 DIAGNOSIS — I110 Hypertensive heart disease with heart failure: Secondary | ICD-10-CM | POA: Diagnosis present

## 2022-09-23 DIAGNOSIS — J449 Chronic obstructive pulmonary disease, unspecified: Secondary | ICD-10-CM | POA: Diagnosis present

## 2022-09-23 DIAGNOSIS — I8510 Secondary esophageal varices without bleeding: Secondary | ICD-10-CM | POA: Diagnosis present

## 2022-09-23 DIAGNOSIS — K7682 Hepatic encephalopathy: Secondary | ICD-10-CM | POA: Diagnosis present

## 2022-09-23 DIAGNOSIS — E785 Hyperlipidemia, unspecified: Secondary | ICD-10-CM | POA: Diagnosis present

## 2022-09-23 DIAGNOSIS — I2510 Atherosclerotic heart disease of native coronary artery without angina pectoris: Secondary | ICD-10-CM | POA: Diagnosis not present

## 2022-09-23 DIAGNOSIS — S0003XA Contusion of scalp, initial encounter: Secondary | ICD-10-CM | POA: Diagnosis not present

## 2022-09-23 DIAGNOSIS — E876 Hypokalemia: Secondary | ICD-10-CM | POA: Diagnosis present

## 2022-09-23 DIAGNOSIS — K7460 Unspecified cirrhosis of liver: Secondary | ICD-10-CM | POA: Diagnosis present

## 2022-09-23 DIAGNOSIS — E871 Hypo-osmolality and hyponatremia: Secondary | ICD-10-CM | POA: Diagnosis present

## 2022-09-23 DIAGNOSIS — F332 Major depressive disorder, recurrent severe without psychotic features: Secondary | ICD-10-CM | POA: Diagnosis present

## 2022-09-23 LAB — GLUCOSE, CAPILLARY
Glucose-Capillary: 116 mg/dL — ABNORMAL HIGH (ref 70–99)
Glucose-Capillary: 232 mg/dL — ABNORMAL HIGH (ref 70–99)

## 2022-09-23 LAB — CBG MONITORING, ED
Glucose-Capillary: 98 mg/dL (ref 70–99)
Glucose-Capillary: 127 mg/dL — ABNORMAL HIGH (ref 70–99)

## 2022-09-23 MED ORDER — OXYCODONE HCL 5 MG PO TABS
5.0000 mg | ORAL_TABLET | ORAL | Status: DC | PRN
  Administered 2022-09-24: 5 mg via ORAL
  Filled 2022-09-23: qty 1

## 2022-09-23 MED ORDER — VANCOMYCIN HCL 1750 MG/350ML IV SOLN
1750.0000 mg | Freq: Once | INTRAVENOUS | Status: AC
  Administered 2022-09-23: 1750 mg via INTRAVENOUS
  Filled 2022-09-23 (×2): qty 350

## 2022-09-23 MED ORDER — SENNOSIDES-DOCUSATE SODIUM 8.6-50 MG PO TABS: 2.0000 | ORAL_TABLET | Freq: Every day | ORAL | Status: DC

## 2022-09-23 MED ORDER — HYDROMORPHONE HCL 1 MG/ML IJ SOLN
0.5000 mg | INTRAMUSCULAR | Status: DC | PRN
  Administered 2022-09-24: 0.5 mg via INTRAVENOUS
  Filled 2022-09-23: qty 0.5

## 2022-09-23 MED ORDER — GERHARDT'S BUTT CREAM
TOPICAL_CREAM | Freq: Three times a day (TID) | CUTANEOUS | Status: DC
  Administered 2022-09-24: 1 via TOPICAL
  Filled 2022-09-23 (×2): qty 1

## 2022-09-23 MED ORDER — SODIUM CHLORIDE 0.9 % IV SOLN
2.0000 g | INTRAVENOUS | Status: DC
  Administered 2022-09-23 – 2022-09-24 (×2): 2 g via INTRAVENOUS
  Filled 2022-09-23 (×2): qty 20

## 2022-09-23 MED ORDER — VANCOMYCIN HCL IN DEXTROSE 1-5 GM/200ML-% IV SOLN: 1000.0000 mg | INTRAVENOUS | Status: DC

## 2022-09-23 NOTE — Progress Notes (Signed)
PROGRESS NOTE    [name]  WJX:[id number] DOB: Dec 24, 1955 DOA: 09/22/2022 PCP: [name], MD   Brief Narrative:  67 y.o. male with medical history significant of  cirrhosis not alcohol related, COPD, history of coronary artery disease with MI, HTN, AAA, CHF, T2DM, spinal stenosis presented with fall at SNF with trauma to face and significant bleeding.  On presentation, he was found to have acute kidney injury with creatinine of 1.7, lactic acid of 2.6.  CT of the head showed a 4 mm subdural hematoma.  Neurosurgery recommended conservative management with repeat CT scan in 6 hours with no intervention if remains stable.  Assessment & Plan:   Subdural hematoma -Most likely traumatic from fall -CT of the head showed a 4 mm subdural hematoma.  Neurosurgery recommended conservative management with repeat CT scan in 6 hours with no intervention if remains stable.  Follow official neurosurgery recommendations  Cellulitis of scrotum -has some cellulitis and open wounds in the scrotum.  Will start IV antibiotics.  Check scrotal ultrasound. -Wound care consultation pending.  Chronic combined systolic and diastolic heart failure Hypertension CAD: Currently stable Hyperlipidemia: Intolerant to statin, continue low-cholesterol diet -Strict input and output.  Daily weights.  Currently compensated.  Continue spironolactone and Lasix.  Outpatient follow-up with cardiology  Cirrhosis of liver, unspecified cause Portal hypertension with splenomegaly, thrombocytopenia and esophageal varices Probable hepatic encephalopathy history -Continue diuretics and rifaximin along with lactulose.  Outpatient follow-up with GI.  COPD -Currently stable.  Continue current inhaled regimen.  Anemia of chronic disease Macrocytosis -Possibly from chronic illnesses.  Hemoglobin stable.  Monitor intermittently  Thrombocytopenia -CT from cirrhosis of liver and splenomegaly.  Monitor.    Fall at  [facility] -Possibly mechanical.  Fall precautions.  PT eval.  TOC consult  Tobacco abuse -Currently on nicotine patch.  Chronic pain -Continue gabapentin, fentanyl patch and oxycodone as needed  Obesity -Outpatient follow-up  OSA -Unclear if she is on CPAP at SNF currently  Goals of care -Patient is very deconditioned and voiced to remain full code on admission although he has a MOST form stating that he is a DNR from his facility.  He repeated to me as well that he wants to be DNR.  Will request palliative care consultation for goals of care discussion  DVT prophylaxis: SCDs Code Status: Full Family Communication: None at bedside Disposition Plan: Status is: Observation The patient will require care spanning > 2 midnights and should be moved to inpatient because: Of severity of illness.  PT eval pending.   Consultants: Neurosurgery.  Consult palliative care.  Procedures: None  Antimicrobials: None   Subjective: Patient seen and examined at bedside.  Poor historian.  No fever, agitation, seizures reported. Objective: Vitals:   09/23/22 0353 09/23/22 0717 09/23/22 1030 09/23/22 1123  BP:  (!) 91/54 123/69   Pulse:  79 75   Resp:  18 18   Temp: 98.6 F (37 C) 97.6 F (36.4 C)  97.9 F (36.6 C)  TempSrc: Oral     SpO2:  97% 95%   Weight:      Height:        Intake/Output Summary (Last 24 hours) at 09/23/2022 1128 Last data filed at 09/23/2022 0859 Gross per 24 hour  Intake 500.33 ml  Output 1000 ml  Net -499.67 ml   Filed Weights   09/22/22 1713  Weight: 102.1 kg    Examination:  General exam: Appears calm and comfortable.  On room air.  Looks chronically ill  and deconditioned. Respiratory system: Bilateral decreased breath sounds at bases with some scattered crackles Cardiovascular system: S1 & S2 heard, Rate controlled Gastrointestinal system: Abdomen is obese, distended, soft and nontender. Normal bowel sounds heard. Extremities: No cyanosis, clubbing;  bilateral lower extremity edema present Central nervous system: Wakes up slightly, slow to respond, poor historian.  Answers some questions appropriately including current year/time.  No focal neurological deficits. Moving extremities Genitourinary: Mild scrotal erythema with some ulcers Psychiatry: Flat affect.  Not agitated.   Data Reviewed: I have personally reviewed following labs and imaging studies  CBC: Recent Labs  Lab 09/22/22 1723 09/22/22 1752  WBC 6.8  --   HGB 11.1* 11.9*  HCT 32.6* 35.0*  MCV 104.2*  --   PLT 50*  --    Basic Metabolic Panel: Recent Labs  Lab 09/22/22 1723 09/22/22 1752  NA 132* 134*  K 3.7 4.1  CL 93* 92*  CO2 29  --   GLUCOSE 135* 132*  BUN 30* 41*  CREATININE 1.60* 1.70*  CALCIUM 8.3*  --    GFR: Estimated Creatinine Clearance: 52.1 mL/min (A) (by C-G formula based on SCr of 1.7 mg/dL (H)). Liver Function Tests: Recent Labs  Lab 09/22/22 1723  AST 46*  ALT 23  ALKPHOS 165*  BILITOT 2.2*  PROT 8.4*  ALBUMIN 2.2*   No results for input(s): "LIPASE", "AMYLASE" in the last 168 hours. No results for input(s): "AMMONIA" in the last 168 hours. Coagulation Profile: Recent Labs  Lab 09/22/22 1723  INR 1.4*   Cardiac Enzymes: No results for input(s): "CKTOTAL", "CKMB", "CKMBINDEX", "TROPONINI" in the last 168 hours. BNP (last 3 results) Recent Labs    05/22/22 1511  PROBNP 162.0*   HbA1C: No results for input(s): "HGBA1C" in the last 72 hours. CBG: Recent Labs  Lab 09/23/22 0732 09/23/22 1121  GLUCAP 98 127*   Lipid Profile: No results for input(s): "CHOL", "HDL", "LDLCALC", "TRIG", "CHOLHDL", "LDLDIRECT" in the last 72 hours. Thyroid Function Tests: No results for input(s): "TSH", "T4TOTAL", "FREET4", "T3FREE", "THYROIDAB" in the last 72 hours. Anemia Panel: No results for input(s): "VITAMINB12", "FOLATE", "FERRITIN", "TIBC", "IRON", "RETICCTPCT" in the last 72 hours. Sepsis Labs: Recent Labs  Lab 09/22/22 1723   LATICACIDVEN 2.6*    No results found for this or any previous visit (from the past 240 hour(s)).    Scheduled Meds:  cyanocobalamin  500 mcg Oral Q M,W,F   fentaNYL  1 patch Transdermal Q72H   ferrous sulfate  325 mg Oral QODAY   folic acid  1 mg Oral Daily   furosemide  80 mg Oral Daily   gabapentin  300 mg Oral BID   insulin aspart  0-15 Units Subcutaneous TID WC   lactulose  20 g Oral QID   nicotine  14 mg Transdermal Daily   pantoprazole  40 mg Oral Daily   rifaximin  550 mg Oral BID   sodium chloride flush  3 mL Intravenous Q12H   spironolactone  50 mg Oral Daily   thiamine  100 mg Oral Daily   umeclidinium bromide  1 puff Inhalation Daily   Continuous Infusions:  sodium chloride            Glade Lloyd, MD [facility] 09/23/2022, 11:28 AM

## 2022-09-23 NOTE — ED Notes (Signed)
Fentanyl patch returned to pharmacy in person

## 2022-09-23 NOTE — Consult Note (Signed)
WOC Nurse Consult Note: Reason for Consult:Patient with perineal dermatitis to scrotum, reported by staff Wound type:irritant contact dermatitis Pressure Injury POA: N/A Dressing procedure/placement/frequency: I have provided Nursing with guidance for the care of the perineal area using a topical compounded preparation consisting of zinc oxide:lotrimin cream; and hydrocortisone cream (Gerhart's Cream). This is to be applied three times daily after cleansing the affected area and may be applied PRN. Turning and repositioning is in place and time in the supine position is to be minimized.  WOC nursing team will not follow, but will remain available to this patient, the nursing and medical teams.  Please re-consult if needed.  Thank you for inviting [place] to participate in this patient's Plan of Care.  Ladona Mow, MSN, RN, CNS, GNP, Leda Min, [employer], [employer] phone:  [phone]

## 2022-09-23 NOTE — Consult Note (Signed)
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 IMPRESSION: 1. No acute/traumatic intrathoracic, abdominal, or pelvic pathology. 2. Cirrhosis with portal hypertension, splenomegaly, and upper abdominal varices. 3. Status post prior aorto bi iliac  endovascular stent graft repair. 4.  Aortic Atherosclerosis (ICD10-I70.0). Electronically Signed   By: Elgie Collard M.D.   On: 09/22/2022 18:38   DG Hand Complete Right  Result Date: 09/22/2022 CLINICAL DATA:  Ground level fall EXAM: RIGHT HAND - COMPLETE 3+ VIEW; LEFT HAND - COMPLETE 3+ VIEW COMPARISON:  None Available. FINDINGS: There is no evidence of fracture or dislocation. Mild arthritis in the IP joints. Soft tissues are unremarkable. IMPRESSION: No acute fracture or dislocation in the bilateral hands. Electronically Signed   By: Minerva Fester M.D.   On: 09/22/2022 17:52   DG Hand Complete Left  Result Date: 09/22/2022 CLINICAL DATA:  Ground level fall EXAM: RIGHT HAND - COMPLETE 3+ VIEW; LEFT HAND - COMPLETE 3+ VIEW COMPARISON:  None Available. FINDINGS: There is no evidence of fracture or  dislocation. Mild arthritis in the IP joints. Soft tissues are unremarkable. IMPRESSION: No acute fracture or dislocation in the bilateral hands. Electronically Signed   By: Minerva Fester M.D.   On: 09/22/2022 17:52   DG Pelvis Portable  Result Date: 09/22/2022 CLINICAL DATA:  Ground level fall. EXAM: PORTABLE PELVIS 1-2 VIEWS COMPARISON:  CT abdomen and pelvis 05/04/2022 FINDINGS: Body habitus limits assessment. Right THA. No evidence of fracture. No dislocation. Vascular stents. IMPRESSION: No definite acute fracture. Electronically Signed   By: Minerva Fester M.D.   On: 09/22/2022 17:50   DG Chest Port 1 View  Result Date: 09/22/2022 CLINICAL DATA:  Trauma, ground level fall EXAM: PORTABLE CHEST 1 VIEW COMPARISON:  04/15/2022 FINDINGS: Left chest wall ICD. Enlarged cardiomediastinal silhouette. Pulmonary vascular congestion. No focal consolidation, pleural effusion, or pneumothorax. Cervical spine fusion hardware. No definite displaced fractures. IMPRESSION: Cardiomegaly with pulmonary vascular congestion. Electronically Signed   By: Minerva Fester M.D.   On: 09/22/2022 17:49    Review of Systems  Constitutional: Negative.   HENT: Negative.    Eyes: Negative.   Respiratory: Negative.    Cardiovascular: Negative.   Gastrointestinal: Negative.   Genitourinary: Negative.   Musculoskeletal: Negative.   Skin: Negative.   Neurological: Negative.   Endo/Heme/Allergies: Negative.   Psychiatric/Behavioral: Negative.     Blood pressure (!) 91/54, pulse 79, temperature 97.6 F (36.4 C), resp. rate 18, height 6' (1.829 m), weight 102.1 kg, SpO2 97 %. Estimated body mass index is 30.52 kg/m as calculated from the following:   Height as of this encounter: 6' (1.829 m).   Weight as of this encounter: 102.1 kg.  Physical Exam HENT:     Head: Normocephalic.      Mouth/Throat:     Dentition: Dental caries present.     Pharynx: Oropharynx is clear.  Eyes:     Extraocular Movements:  Extraocular movements intact.     Pupils: Pupils are equal, round, and reactive to light.  Cardiovascular:     Rate and Rhythm: Normal rate and regular rhythm.     Pulses: Normal pulses.  Pulmonary:     Effort: Pulmonary effort is normal.  Abdominal:     Palpations: Abdomen is soft.  Musculoskeletal:        General: Normal range of motion.     Cervical back: Normal range of motion and neck supple.  Skin:    General: Skin is warm and dry.     Capillary Refill: Capillary refill takes less than 2 seconds.  Neurological:     Mental Status: He is alert.     Sensory: No sensory deficit.     Motor: Motor function is  intact.     Coordination: Coordination is intact.     Comments: Oriented to person, time, and situation. Reported he was at [facility].     Assessment/Plan: Patient with subdural hematoma following fall. Follow up imaging stable. No Neurosurgical intervention recommended. Recommend holding Plavix for 2 weeks. No further imaging necessary unless the patient has a decline in mental status.  Val Eagle, DNP, AGNP-C Nurse Practitioner  [facility] [address], [place], [place] [address] P: [phone]    F: [phone]  09/23/2022, 9:31 AM

## 2022-09-23 NOTE — Progress Notes (Signed)
Pharmacy Antibiotic Note  [name] is a 67 y.o. male admitted on 09/22/2022 presenting from SNF with fall, open wounds + cellulitis.  Pharmacy has been consulted for vancomycin dosing.  Ceftriaxone per MD  Plan: Vancomycin 1750 mg IV x 1, then 1g IV q 24h (eAUC 457) Monitor renal function, Cx and clinical progression to narrow Vancomycin levels as indicated    Height: 6' (182.9 cm) Weight: 102.1 kg (225 lb) IBW/kg (Calculated) : 77.6  Temp (24hrs), Avg:98.2 F (36.8 C), Min:97.6 F (36.4 C), Max:98.6 F (37 C)  Recent Labs  Lab 09/22/22 1723 09/22/22 1752  WBC 6.8  --   CREATININE 1.60* 1.70*  LATICACIDVEN 2.6*  --     Estimated Creatinine Clearance: 52.1 mL/min (A) (by C-G formula based on SCr of 1.7 mg/dL (H)).    Allergies  Allergen Reactions   Statins Shortness Of Breath    Cough, trouble breathing Cough, trouble breathing   Losartan Other (See Comments)    Causes him to have pain   Wellbutrin [Bupropion] Other (See Comments)    Worsened mood - crying   Allopurinol Nausea Only   Baclofen Nausea And Vomiting   Penicillins Nausea And Vomiting    Did it involve swelling of the face/tongue/throat, SOB, or low BP? N/A Did it involve sudden or severe rash/hives, skin peeling, or any reaction on the inside of your mouth or nose? N/A Did you need to seek medical attention at a hospital or doctor's office? N/A When did it last happen? Child     If all above answers are "NO", may proceed with cephalosporin use.   Tramadol Nausea Only    Daylene Posey, PharmD, [facility] Clinical Pharmacist ED Pharmacist Phone # [phone] 09/23/2022 12:14 PM

## 2022-09-23 NOTE — Evaluation (Signed)
Physical Therapy Evaluation Patient Details Name: [name] MRN: [id number] DOB: 16-Oct-1955 Today's Date: 09/23/2022  History of Present Illness  Pt is a 67 y.o. M who presents 09/22/2022 with a fall. CT scan showed 4 mm SDH. Significant PMH: cirrhosis, COPD, CAD with MI, HTN, AAA, CHF, T2DM, spinal stenosis.  Clinical Impression  Pt admitted with above. Presents with generalized weakness, impaired sitting and standing balance, decreased skin integrity, decreased activity tolerance. Pt requiring up to two person min-mod assist for bed mobility and transfers to standing using RW. Unable to ambulate. Recommend return to SNF at d/c.     Recommendations for follow up therapy are one component of a multi-disciplinary discharge planning process, led by the attending physician.  Recommendations may be updated based on patient status, additional functional criteria and insurance authorization.  Follow Up Recommendations Can patient physically be transported by private vehicle: No     Assistance Recommended at Discharge Frequent or constant Supervision/Assistance  Patient can return home with the following  A lot of help with walking and/or transfers;A lot of help with bathing/dressing/bathroom;Assistance with cooking/housework;Direct supervision/assist for medications management;Assist for transportation    Equipment Recommendations None recommended by PT  Recommendations for Other Services       Functional Status Assessment Patient has had a recent decline in their functional status and demonstrates the ability to make significant improvements in function in a reasonable and predictable amount of time.     Precautions / Restrictions Precautions Precautions: Fall Restrictions Weight Bearing Restrictions: No      Mobility  Bed Mobility Overal bed mobility: Needs Assistance Bed Mobility: Supine to Sit, Sit to Supine     Supine to sit: Mod assist, +2 for physical assistance Sit  to supine: Mod assist, +2 for safety/equipment   General bed mobility comments: Pt able to initiate BLE's movement to edge of bed, requiring assist to execute and bring trunk to sitting position. Assist to return back to supine    Transfers Overall transfer level: Needs assistance Equipment used: Rolling walker (2 wheels) Transfers: Sit to/from Stand Sit to Stand: Min assist, +2 physical assistance           General transfer comment: MinA + 2 to stand from stretcher, crouched posture, unable to ambulate. Able to laterally scoot along edge to head of bed    Ambulation/Gait                  Stairs            Wheelchair Mobility    Modified Rankin (Stroke Patients Only)       Balance Overall balance assessment: Needs assistance Sitting-balance support: Feet supported Sitting balance-Leahy Scale: Fair     Standing balance support: Bilateral upper extremity supported Standing balance-Leahy Scale: Poor                               Pertinent Vitals/Pain Pain Assessment Pain Assessment: Faces Faces Pain Scale: Hurts little more Pain Location: scrotum, back Pain Descriptors / Indicators: Discomfort Pain Intervention(s): Patient requesting pain meds-RN notified    Home Living Family/patient expects to be discharged to:: Skilled nursing facility                   Additional Comments: [facility]    Prior Function Prior Level of Function : Patient poor historian/Family not available             Mobility Comments:  uses Rollator, states does not walk to dining hall ADLs Comments: pt states requiring assist with ADL's, able to self feed     Hand Dominance        Extremity/Trunk Assessment   Upper Extremity Assessment Upper Extremity Assessment: Generalized weakness    Lower Extremity Assessment Lower Extremity Assessment: Generalized weakness       Communication   Communication: No difficulties  Cognition  Arousal/Alertness: Awake/alert Behavior During Therapy: WFL for tasks assessed/performed Overall Cognitive Status: No family/caregiver present to determine baseline cognitive functioning                                 General Comments: Pt A&O to self, time and situation. Not oriented to place, stating he was at [facility]        General Comments      Exercises     Assessment/Plan    PT Assessment Patient needs continued PT services  PT Problem List Decreased strength;Decreased activity tolerance;Decreased mobility;Decreased balance;Decreased cognition;Decreased skin integrity       PT Treatment Interventions DME instruction;Gait training;Functional mobility training;Therapeutic activities;Therapeutic exercise;Balance training;Patient/family education    PT Goals (Current goals can be found in the Care Plan section)  Acute Rehab PT Goals Patient Stated Goal: less pain PT Goal Formulation: With patient Time For Goal Achievement: 10/07/22 Potential to Achieve Goals: Fair    Frequency Min 2X/week     Co-evaluation               AM-PAC PT "6 Clicks" Mobility  Outcome Measure Help needed turning from your back to your side while in a flat bed without using bedrails?: A Lot Help needed moving from lying on your back to sitting on the side of a flat bed without using bedrails?: A Lot Help needed moving to and from a bed to a chair (including a wheelchair)?: A Lot Help needed standing up from a chair using your arms (e.g., wheelchair or bedside chair)?: A Lot Help needed to walk in hospital room?: Total Help needed climbing 3-5 steps with a railing? : Total 6 Click Score: 10    End of Session Equipment Utilized During Treatment: Gait belt Activity Tolerance: Patient limited by pain Patient left: in bed;with call bell/phone within reach Nurse Communication: Mobility status;Other (comment) (skin tears along scrotum) PT Visit Diagnosis: Unsteadiness on  feet (R26.81);History of falling (Z91.81);Muscle weakness (generalized) (M62.81)    Time: 1610-9604 PT Time Calculation (min) (ACUTE ONLY): 22 min   Charges:   PT Evaluation $PT Eval Moderate Complexity: 1 Mod          Lillia Pauls, PT, DPT Acute Rehabilitation Services Office [phone]   Norval Morton 09/23/2022, 1:11 PM

## 2022-09-23 NOTE — Progress Notes (Signed)
Pt constantly calling and asking for additional pain meds after med were given at 2129. Pt c/o chronic pain (back) unrelated to pt fall reason for admission. MD rounding on the unit and assessed pt pain concerns and ordered additional pain med and adjusted pain med time interval. CN assisted in administering pain med, pt was sleeping and pain med not needed, writer went to assess later and called pt name several times and pt appear to be resting comfortably as he did not respond to name call. Breathing stable and unlabored. Will administer pain med as needed when pt awake and request something for pain. SRP, RN

## 2022-09-23 NOTE — ED Notes (Signed)
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 Status post prior aorto bi iliac endovascular stent graft repair. Evaluation of the stent graft is limited on this CT. Bilateral renal artery stents noted. The IVC is unremarkable. No portal venous gas. Upper abdominal  varices including esophageal and gastric varices. There is no adenopathy. Reproductive: The prostate and seminal vesicles are grossly remarkable. No pelvic mass. Other: None Musculoskeletal: Old L4 compression fracture. Postsurgical changes of the lower lumbar laminectomy. Total right hip arthroplasty. No acute osseous pathology. IMPRESSION: 1. No acute/traumatic intrathoracic, abdominal, or pelvic pathology. 2. Cirrhosis with portal hypertension, splenomegaly, and upper abdominal varices. 3. Status post prior aorto bi iliac endovascular stent graft repair. 4.  Aortic Atherosclerosis (ICD10-I70.0). Electronically Signed   By: Elgie Collard M.D.   On: 09/22/2022 18:38   DG Hand Complete Right  Result Date: 09/22/2022 CLINICAL DATA:  Ground level fall EXAM: RIGHT HAND  - COMPLETE 3+ VIEW; LEFT HAND - COMPLETE 3+ VIEW COMPARISON:  None Available. FINDINGS: There is no evidence of fracture or dislocation. Mild arthritis in the IP joints. Soft tissues are unremarkable. IMPRESSION: No acute fracture or dislocation in the bilateral hands. Electronically Signed   By: Minerva Fester M.D.   On: 09/22/2022 17:52   DG Hand Complete Left  Result Date: 09/22/2022 CLINICAL DATA:  Ground level fall EXAM: RIGHT HAND - COMPLETE 3+ VIEW; LEFT HAND - COMPLETE 3+ VIEW COMPARISON:  None Available. FINDINGS: There is no evidence of fracture or dislocation. Mild arthritis in the IP joints. Soft tissues are unremarkable. IMPRESSION: No acute fracture or dislocation in the bilateral hands. Electronically Signed   By: Minerva Fester M.D.   On: 09/22/2022 17:52   DG Pelvis Portable  Result Date: 09/22/2022 CLINICAL DATA:  Ground level fall. EXAM: PORTABLE PELVIS 1-2 VIEWS COMPARISON:  CT abdomen and pelvis 05/04/2022 FINDINGS: Body habitus limits assessment. Right THA. No evidence of fracture. No dislocation. Vascular stents. IMPRESSION: No definite acute fracture. Electronically Signed   By: Minerva Fester M.D.   On: 09/22/2022 17:50   DG Chest Port 1 View  Result Date: 09/22/2022 CLINICAL DATA:  Trauma, ground level fall EXAM: PORTABLE CHEST 1 VIEW COMPARISON:  04/15/2022 FINDINGS: Left chest wall ICD. Enlarged cardiomediastinal silhouette. Pulmonary vascular congestion. No focal consolidation, pleural effusion, or pneumothorax. Cervical spine fusion hardware. No definite displaced fractures. IMPRESSION: Cardiomegaly with pulmonary vascular congestion. Electronically Signed   By: Minerva Fester M.D.   On: 09/22/2022 17:49    Pending Labs Unresulted Labs (From admission, onward)     Start     Ordered   09/24/22 0500  CBC with Differential/Platelet  Tomorrow morning,   R        09/23/22 1158   09/24/22 0500  Comprehensive metabolic panel  Tomorrow morning,   R        09/23/22 1158    09/24/22 0500  Magnesium  Tomorrow morning,   R        09/23/22 1158   09/22/22 2048  TSH  Add-on,   AD        09/22/22 2049   09/22/22 2048  Hemoglobin A1c  (Glycemic Control (SSI)  Q 4 Hours / Glycemic Control (SSI)  AC +/- HS)  Add-on,   AD       Comments: To assess prior glycemic control    09/22/22 2049            Vitals/Pain Today's Vitals   09/23/22 0733 09/23/22 1030 09/23/22 1046 09/23/22 1123  BP:  123/69    Pulse:  75    Resp:  18    Temp:    97.9 F (36.6 C)  TempSrc:      SpO2:  95%    Weight:  Height:      PainSc: 8   6      Isolation Precautions No active isolations  Medications Medications  fentaNYL (DURAGESIC) 75 MCG/HR 1 patch (0 patches Transdermal Hold 09/22/22 2250)  oxyCODONE (Oxy IR/ROXICODONE) immediate release tablet 5 mg (5 mg Oral Given 09/23/22 0317)  rifaximin (XIFAXAN) tablet 550 mg (550 mg Oral Given 09/23/22 0950)  furosemide (LASIX) tablet 80 mg (80 mg Oral Given 09/23/22 0950)  nitroGLYCERIN (NITROSTAT) SL tablet 0.4 mg (has no administration in time range)  spironolactone (ALDACTONE) tablet 50 mg (50 mg Oral Given 09/23/22 0950)  nicotine (NICODERM CQ - dosed in mg/24 hours) patch 14 mg (14 mg Transdermal Patch Applied 09/23/22 0950)  lactulose (CHRONULAC) 10 GM/15ML solution 20 g (20 g Oral Given 09/23/22 0951)  pantoprazole (PROTONIX) EC tablet 40 mg (40 mg Oral Given 09/23/22 0950)  ondansetron (ZOFRAN) tablet 4 mg (has no administration in time range)  ferrous sulfate tablet 325 mg (325 mg Oral Given 09/23/22 0950)  folic acid (FOLVITE) tablet 1 mg (1 mg Oral Given 09/23/22 0950)  cyanocobalamin (VITAMIN B12) tablet 500 mcg (500 mcg Oral Given 09/22/22 2211)  gabapentin (NEURONTIN) capsule 300 mg (300 mg Oral Given 09/23/22 0950)  albuterol (PROVENTIL) (2.5 MG/3ML) 0.083% nebulizer solution 2.5 mg (has no administration in time range)  umeclidinium bromide (INCRUSE ELLIPTA) 62.5 MCG/ACT 1 puff (1 puff Inhalation Not Given 09/23/22  0952)  insulin aspart (novoLOG) injection 0-15 Units (2 Units Subcutaneous Given 09/23/22 1125)  sodium chloride flush (NS) 0.9 % injection 3 mL (3 mLs Intravenous Not Given 09/23/22 0953)  sodium chloride flush (NS) 0.9 % injection 3 mL (has no administration in time range)  0.9 %  sodium chloride infusion (has no administration in time range)  polyethylene glycol (MIRALAX / GLYCOLAX) packet 17 g (has no administration in time range)  thiamine (VITAMIN B1) tablet 100 mg (100 mg Oral Given 09/23/22 0950)  metoprolol tartrate (LOPRESSOR) injection 5 mg (has no administration in time range)  cefTRIAXone (ROCEPHIN) 2 g in sodium chloride 0.9 % 100 mL IVPB (has no administration in time range)  Tdap (BOOSTRIX) injection 0.5 mL (0.5 mLs Intramuscular Given 09/22/22 1825)  fentaNYL (SUBLIMAZE) injection 50 mcg (50 mcg Intravenous Given 09/22/22 1824)  oxymetazoline (AFRIN) 0.05 % nasal spray 2 spray (2 sprays Each Nare Given 09/22/22 1827)  lactated ringers bolus 500 mL (0 mLs Intravenous Stopped 09/23/22 0353)  iohexol (OMNIPAQUE) 350 MG/ML injection 75 mL (75 mLs Intravenous Contrast Given 09/22/22 1819)    Mobility walks with device     Focused Assessments Cardiac Assessment Handoff:    Lab Results  Component Value Date   CKTOTAL 332 05/04/2022   No results found for: "DDIMER" Does the Patient currently have chest pain? No    R Recommendations: See Admitting Provider Note  Report given to:   Additional Notes:

## 2022-09-23 NOTE — Consult Note (Addendum)
Consultation Note Date: 09/23/2022   Patient Name: [name]  DOB: 1955-06-14  MRN: [id number]  Age / Sex: 67 y.o., male  PCP: [name], MD Referring Physician: Glade Lloyd, MD  Reason for Consultation: "Very deconditioned from SNF. Has a recent DNR. Now wants to be full code "  HPI/Patient Profile: 67 y.o. male  with past medical history of cirrhosis not alcohol related, COPD, history of coronary artery disease with MI, HTN, AAA, CHF, T2DM, and spinal stenosis presented to ED from [facility] on 09/22/22 after experiencing a fall over his walker, hitting his face. Patient was admitted on 09/22/2022 with subdural hematoma, scrotum cellulitis, thrombocytopenia, AKI, s/p fall.   Clinical Assessment and Goals of Care: I have reviewed medical records including EPIC notes, labs, and imaging. MOST form in ACP tab reviewed and printed to discuss with patient. Received report from primary RN - no acute concerns. RN reports patient has been very sleepy since arrival from ED and requesting not to be bothered.   Went to visit patient at bedside - no family/visitors present. Patient was lying in bed asleep - he does wake to voice/gentle touch. No signs or non-verbal gestures of pain or discomfort noted. No respiratory distress, increased work of breathing, or secretions noted. Laceration to nose noted - steri strips in place.  Attempted to met with to discuss diagnosis, prognosis, GOC, EOL wishes, disposition, and options.  I introduced Palliative Medicine as specialized medical care for people living with serious illness. It focuses on providing relief from the symptoms and stress of a serious illness. The goal is to improve quality of life for both the patient and the family.  Patient requests I return tomorrow for discussions - he reports he is "very tired" and wants to sleep.  Primary Decision  Maker: PATIENT    SUMMARY OF RECOMMENDATIONS   Patient was not open to PMT visit today - he requests return visit tomorrow 4/22 PMT will continue to attempt code status discussion tomorrow 4/22 PMT will continue to follow and support holistically   Code Status/Advance Care Planning: Full code  Palliative Prophylaxis:  Aspiration, Delirium Protocol, Frequent Pain Assessment, and Turn Reposition  Additional Recommendations (Limitations, Scope, Preferences): Full Scope Treatment  Psycho-social/Spiritual:  Desire for further Chaplaincy support: n/a Created space and opportunity for patient to express thoughts and feelings regarding patient's current medical situation.  Emotional support and therapeutic listening provided.  Prognosis:  Unable to determine  Discharge Planning: To Be Determined      Primary Diagnoses: Present on Admission:  CAD (coronary artery disease)  MDD (major depressive disorder), recurrent severe, without psychosis  HLD (hyperlipidemia)  Cirrhosis of liver not due to alcohol  COPD (chronic obstructive pulmonary disease)  Thrombocytopenia  Charcot-Marie-Tooth disease  (Resolved) OSA (obstructive sleep apnea)  Abdominal aortic aneurysm (AAA) without rupture  Portal hypertension  Subdural hematoma  Cellulitis, scrotum  Essential hypertension  Smoker  Chronic combined systolic and diastolic heart failure  Obesity, morbid, BMI 40.0-49.9  Cervical spondylosis   I have reviewed the medical record, interviewed the patient and family, and examined the patient. The following aspects are pertinent.  Past Medical History:  Diagnosis Date   AAA (abdominal aortic aneurysm) (HCC) 09/2012--  monitored by dr Myra Gianotti   stable 5.6cm CTA abdomen 2016   Abnormal drug screen 07/09/2016   1/2/018 - positive oxycodone, fentanyl, inapprop positive MJ - mod risk   Allergic rhinitis    Ascites 03/2019   B12 deficiency    CAD (coronary  artery disease) cardiologist-   dr Jens Som   x3 with stents last 2005, EF 40%, predominantly RCA by CT 2016   Cataracts, bilateral    Cervical spondylosis 05/2010   s/p surgery   Charcot Hilda Lias Tooth muscular atrophy dx  1975   neurologist--  dr love--  type 2 per pt   Chronic pain syndrome    established with [facility] --> disagreement and transfered care to Dr Regenia Skeeter at [facility], requests PCP write Rx but f/u with pain clinic Q6-12 months   COPD (chronic obstructive pulmonary disease) (HCC) 10/2011   minimal by PFTs   DDD (degenerative disc disease)    Disturbances of sensation of smell and taste    improving   Dyspnea on exertion    GERD (gastroesophageal reflux disease)    Gout    Headache    Hepatitis    hepatitis B   Hidradenitis    right groin   Hidradenitis suppurativa dx 2011   goin and leg crease   followd by Roxan Hockey - daily bactrim, s/p intralesional steroid injection 10/2010   Hip osteoarthritis    s/p intraarticular steroid shot (12/2012) (Ibazebo/Caffrey)   History of hepatitis B 1983   History of MI (myocardial infarction)    2000  &  2005   History of pneumonia    History of viral meningitis 2000   HLD (hyperlipidemia)    HTN (hypertension)    Ischemic cardiomyopathy    s/p inferior MI  --  current ef per myoview 39%   Liver cirrhosis secondary to NASH (HCC) 01/2014   by CT scan, rec virtual colonoscopy by Dr Loreta Ave 06/2014   Lumbar herniated disc    Myocardial infarction (HCC)    x2   Nocturia more than twice per night    Obesity    Spinal stenosis    released from NSG.  established with [facility] (07/2013)   T2DM (type 2 diabetes mellitus) (HCC)    ABIs WNL 2016   Vitamin D deficiency    Social History   Socioeconomic History   Marital status: Married    Spouse name: Scarlette Calico   Number of children: 0   Years of education: College   Highest education level: Not on file  Occupational History   Occupation: Copywriter, advertising implants,  [employer]-now disability 2006    Employer: UNEMPLOYED  Tobacco Use   Smoking status: Every Day    Packs/day: 1.00    Years: 57.00    Additional pack years: 0.00    Total pack years: 57.00    Types: Cigarettes, E-cigarettes    Start date: 06/06/1967    Last attempt to quit: 07/21/2019    Years since quitting: 3.1   Smokeless tobacco: Never   Tobacco comments:    stopped smoking a pipe in 2015  DOES SMOKE E CIG  Vaping Use   Vaping Use: Former  Substance and Sexual Activity   Alcohol use: No    Alcohol/week: 0.0 standard drinks of alcohol   Drug use: Yes    Types: Fentanyl, Hydrocodone   Sexual activity: Not Currently  Other Topics Concern   Not on file  Social History Narrative   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    Caffeine: 2 cups coffee, 2 cups soda   Occupation: Copywriter, advertising implants, [employer], now disability 2006   Lives with wife, 1 dog, no children   Social Determinants of Health   Financial Resource Strain: High  Risk (12/27/2020)   Overall Financial Resource Strain (CARDIA)    Difficulty of Paying Living Expenses: Hard  Food Insecurity: No Food Insecurity (07/09/2022)   Hunger Vital Sign    Worried About Running Out of Food in the Last Year: Never true    Ran Out of Food in the Last Year: Never true  Transportation Needs: No Transportation Needs (07/09/2022)   PRAPARE - Administrator, Civil Service (Medical): No    Lack of Transportation (Non-Medical): No  Physical Activity: Unknown (02/20/2018)   Exercise Vital Sign    Days of Exercise per Week: Patient declined    Minutes of Exercise per Session: Patient declined  Stress: Not on file  Social Connections: Unknown (02/20/2018)   Social Connection and Isolation Panel [NHANES]    Frequency of Communication with Friends and Family: Patient declined    Frequency of Social Gatherings with Friends and Family: Patient declined    Attends Religious Services: Patient declined    Automotive engineer or Organizations: Patient declined    Attends Engineer, structural: Patient declined    Marital Status: Patient declined   Family History  Problem Relation Age of Onset   Cancer Mother        colon   Diabetes Mother    Kidney disease Mother    Aneurysm Mother        AAA   Rheum arthritis Mother    Charcot-Marie-Tooth disease Mother    Heart disease Mother        before age 17   Cancer Father        skin   Heart attack Father    Heart disease Father        before age 16   Cancer Brother        skin   Coronary artery disease Brother    Cancer Brother        small cell lung cancer   Aneurysm Brother        AAA   Rheum arthritis Sister    Rheum arthritis Brother    Prostate cancer Neg Hx    Bladder Cancer Neg Hx    Kidney cancer Neg Hx    Scheduled Meds:  cyanocobalamin  500 mcg Oral Q M,W,F   fentaNYL  1 patch Transdermal Q72H   ferrous sulfate  325 mg Oral QODAY   folic acid  1 mg Oral Daily   furosemide  80 mg Oral Daily   gabapentin  300 mg Oral BID   Gerhardt's butt cream   Topical TID   insulin aspart  0-15 Units Subcutaneous TID WC   lactulose  20 g Oral QID   nicotine  14 mg Transdermal Daily   pantoprazole  40 mg Oral Daily   rifaximin  550 mg Oral BID   sodium chloride flush  3 mL Intravenous Q12H   spironolactone  50 mg Oral Daily   thiamine  100 mg Oral Daily   umeclidinium bromide  1 puff Inhalation Daily   Continuous Infusions:  sodium chloride     cefTRIAXone (ROCEPHIN)  IV Stopped (09/23/22 1303)   [START ON 09/24/2022] vancomycin     vancomycin     PRN Meds:.sodium chloride, albuterol, metoprolol tartrate, nitroGLYCERIN, ondansetron, oxyCODONE, polyethylene glycol, sodium chloride flush Medications Prior to Admission:  Prior to Admission medications   Medication Sig Start Date End Date Taking? Authorizing Provider  albuterol (PROVENTIL) (2.5 MG/3ML) 0.083% nebulizer solution USE 1 VIAL PER NEBULIZER  EVERY 6 HRS AS  NEEDED FOR WHEEZING Patient taking differently: Take 2.5 mg by nebulization every 6 (six) hours as needed for wheezing or shortness of breath. 12/03/20  Yes [name], MD  albuterol (VENTOLIN HFA) 108 (90 Base) MCG/ACT inhaler TAKE 2 PUFFS BY MOUTH EVERY 6 HOURS AS NEEDED FOR WHEEZE OR SHORTNESS OF BREATH Patient taking differently: Inhale 2 puffs into the lungs every 6 (six) hours as needed for wheezing or shortness of breath. 12/14/21  Yes [name], MD  aspirin EC 81 MG tablet Take 81 mg by mouth daily. Swallow whole.   Yes [provider]  carvedilol (COREG) 3.125 MG tablet Take 3.125 mg by mouth 2 (two) times daily.   Yes [provider]  clopidogrel (PLAVIX) 75 MG tablet Take 75 mg by mouth daily.   Yes [provider]  fentaNYL (DURAGESIC) 75 MCG/HR Place 1 patch onto the skin every 3 (three) days. 07/12/22  Yes Narda Bonds, MD  gabapentin (NEURONTIN) 300 MG capsule Take 1 capsule (300 mg total) by mouth in the morning and at bedtime. 06/27/21  Yes [name], MD  lactulose [facility]) 10 GM/15ML solution Take 30 mLs by mouth 4 (four) times daily.   Yes [provider]  Lactulose 20 GM/30ML SOLN Take 45 mLs by mouth in the morning, at noon, and at bedtime.   Yes [provider]  metolazone (ZAROXOLYN) 2.5 MG tablet Take 2.5 mg by mouth. Mondays and thursdays 09/21/22  Yes [provider]  nicotine (NICODERM CQ - DOSED IN MG/24 HOURS) 14 mg/24hr patch APPLY 1 PATCH ONTO THE SKIN EVERY DAY Patient taking differently: Place 14 mg onto the skin daily. 02/09/22  Yes [name], MD  nitroGLYCERIN (NITROSTAT) 0.4 MG SL tablet Place 1 tablet (0.4 mg total) under the tongue every 5 (five) minutes as needed for chest pain. 10/10/12  Yes Lewayne Bunting, MD  [facility] powder Apply 1 Application topically 3 (three) times daily. 09/21/22  Yes [provider]  oxyCODONE (OXY IR/ROXICODONE) 5 MG immediate release tablet Take  1 tablet (5 mg total) by mouth every 8 (eight) hours as needed for severe pain. Patient taking differently: Take 5 mg by mouth every 6 (six) hours as needed for severe pain. 07/09/22  Yes Narda Bonds, MD  pantoprazole (PROTONIX) 40 MG tablet Take 40 mg by mouth daily.   Yes [provider]  spironolactone (ALDACTONE) 100 MG tablet Take 0.5 tablets (50 mg total) by mouth daily. Patient taking differently: Take 100 mg by mouth daily. 05/09/22  Yes Elgergawy, Leana Roe, MD  TART CHERRY PO Take 1 tablet by mouth in the morning and at bedtime.   Yes [provider]  Tiotropium Bromide Monohydrate (SPIRIVA RESPIMAT) 2.5 MCG/ACT AERS Inhale 2 puffs into the lungs daily. 10/04/21  Yes [name], MD  torsemide (DEMADEX) 20 MG tablet Take 20 mg by mouth daily. 08/18/22  Yes [provider]  vitamin B-12 (CYANOCOBALAMIN) 500 MCG tablet Take 1 tablet (500 mcg total) by mouth every Monday, Wednesday, and Friday. 12/27/18  Yes [name], MD  XIFAXAN 550 MG TABS tablet Take 550 mg by mouth 2 (two) times daily. 06/24/22  Yes [provider]  ferrous sulfate 325 (65 FE) MG tablet Take 1 tablet (325 mg total) by mouth every other day. 04/29/19   [name], MD  folic acid (FOLVITE) 1 MG tablet TAKE 1 TABLET BY MOUTH EVERY DAY Patient not taking: Reported on 09/23/2022 11/07/21   Sharen Hones,  Wynona Canes, MD  furosemide (LASIX) 40 MG tablet Take 2 tablets (80 mg total) by mouth daily. Patient not taking: Reported on 09/23/2022 06/24/22   [name], MD  omeprazole (PRILOSEC) 40 MG capsule TAKE 1 CAPSULE BY MOUTH EVERY DAY Patient not taking: Reported on 09/23/2022 12/14/21   [name], MD  ondansetron (ZOFRAN) 4 MG tablet TAKE 1 TABLET BY MOUTH EVERY 8 HOURS AS NEEDED FOR NAUSEA AND VOMITING Patient not taking: Reported on 09/23/2022 01/09/22   Wyline Mood, MD   Allergies  Allergen Reactions   Statins Shortness Of Breath    Cough, trouble breathing Cough,  trouble breathing   Losartan Other (See Comments)    Causes him to have pain   Wellbutrin [Bupropion] Other (See Comments)    Worsened mood - crying   Allopurinol Nausea Only   Baclofen Nausea And Vomiting   Penicillins Nausea And Vomiting    Did it involve swelling of the face/tongue/throat, SOB, or low BP? N/A Did it involve sudden or severe rash/hives, skin peeling, or any reaction on the inside of your mouth or nose? N/A Did you need to seek medical attention at a hospital or doctor's office? N/A When did it last happen? Child     If all above answers are "NO", may proceed with cephalosporin use.   Tramadol Nausea Only   Review of Systems  Unable to perform ROS: Other    Physical Exam Vitals and nursing note reviewed.  Constitutional:      General: He is not in acute distress. Pulmonary:     Effort: No respiratory distress.  Skin:    General: Skin is warm and dry.  Neurological:     Mental Status: He is alert.     Motor: Weakness present.  Psychiatric:        Attention and Perception: Attention normal.        Behavior: Behavior is cooperative.        Cognition and Memory: Cognition and memory normal.     Vital Signs: BP 123/69   Pulse 75   Temp 97.9 F (36.6 C)   Resp 18   Ht 6' (1.829 m)   Wt 102.1 kg   SpO2 95%   BMI 30.52 kg/m  Pain Scale: 0-10   Pain Score: 6    SpO2: SpO2: 95 % O2 Device:SpO2: 95 % O2 Flow Rate: .   IO: Intake/output summary:  Intake/Output Summary (Last 24 hours) at 09/23/2022 1401 Last data filed at 09/23/2022 0859 Gross per 24 hour  Intake 500.33 ml  Output 1000 ml  Net -499.67 ml    LBM:   Baseline Weight: Weight: 102.1 kg Most recent weight: Weight: 102.1 kg     Palliative Assessment/Data: PPS 50-60%     Time In: 1400 Time Out: 1450 Time Total: 50 minutes  Greater than 50%  of this time was spent counseling and coordinating care related to the above assessment and plan.  Signed by: Haskel Khan, NP    Please contact Palliative Medicine Team phone at [phone] for questions and concerns.  For individual provider: See Amion  *Portions of this note are a verbal dictation therefore any spelling and/or grammatical errors are due to the "Dragon Medical One" system interpretation.

## 2022-09-24 ENCOUNTER — Encounter (HOSPITAL_COMMUNITY): Payer: Self-pay | Admitting: Internal Medicine

## 2022-09-24 DIAGNOSIS — Z7189 Other specified counseling: Secondary | ICD-10-CM

## 2022-09-24 DIAGNOSIS — I2510 Atherosclerotic heart disease of native coronary artery without angina pectoris: Secondary | ICD-10-CM

## 2022-09-24 DIAGNOSIS — N492 Inflammatory disorders of scrotum: Secondary | ICD-10-CM | POA: Diagnosis not present

## 2022-09-24 DIAGNOSIS — Z515 Encounter for palliative care: Secondary | ICD-10-CM | POA: Diagnosis not present

## 2022-09-24 DIAGNOSIS — S0121XA Laceration without foreign body of nose, initial encounter: Secondary | ICD-10-CM | POA: Diagnosis not present

## 2022-09-24 DIAGNOSIS — Z66 Do not resuscitate: Secondary | ICD-10-CM

## 2022-09-24 DIAGNOSIS — S065XAA Traumatic subdural hemorrhage with loss of consciousness status unknown, initial encounter: Secondary | ICD-10-CM | POA: Diagnosis not present

## 2022-09-24 DIAGNOSIS — W19XXXA Unspecified fall, initial encounter: Secondary | ICD-10-CM | POA: Diagnosis not present

## 2022-09-24 LAB — CBC WITH DIFFERENTIAL/PLATELET
Abs Immature Granulocytes: 0.03 10*3/uL (ref 0.00–0.07)
Basophils Absolute: 0 10*3/uL (ref 0.0–0.1)
Basophils Relative: 1 %
Eosinophils Absolute: 0.2 10*3/uL (ref 0.0–0.5)
Eosinophils Relative: 3 %
HCT: 34.5 % — ABNORMAL LOW (ref 39.0–52.0)
Hemoglobin: 11.5 g/dL — ABNORMAL LOW (ref 13.0–17.0)
Immature Granulocytes: 1 %
Lymphocytes Relative: 14 %
Lymphs Abs: 0.8 10*3/uL (ref 0.7–4.0)
MCH: 35.1 pg — ABNORMAL HIGH (ref 26.0–34.0)
MCHC: 33.3 g/dL (ref 30.0–36.0)
MCV: 105.2 fL — ABNORMAL HIGH (ref 80.0–100.0)
Monocytes Absolute: 0.6 10*3/uL (ref 0.1–1.0)
Monocytes Relative: 10 %
Neutro Abs: 4.2 10*3/uL (ref 1.7–7.7)
Neutrophils Relative %: 71 %
Platelets: 44 10*3/uL — ABNORMAL LOW (ref 150–400)
RBC: 3.28 MIL/uL — ABNORMAL LOW (ref 4.22–5.81)
RDW: 16.5 % — ABNORMAL HIGH (ref 11.5–15.5)
WBC: 5.8 10*3/uL (ref 4.0–10.5)
nRBC: 0 % (ref 0.0–0.2)

## 2022-09-24 LAB — COMPREHENSIVE METABOLIC PANEL
ALT: 25 U/L (ref 0–44)
AST: 43 U/L — ABNORMAL HIGH (ref 15–41)
Albumin: 2 g/dL — ABNORMAL LOW (ref 3.5–5.0)
Alkaline Phosphatase: 131 U/L — ABNORMAL HIGH (ref 38–126)
Anion gap: 11 (ref 5–15)
BUN: 30 mg/dL — ABNORMAL HIGH (ref 8–23)
CO2: 29 mmol/L (ref 22–32)
Calcium: 8.6 mg/dL — ABNORMAL LOW (ref 8.9–10.3)
Chloride: 91 mmol/L — ABNORMAL LOW (ref 98–111)
Creatinine, Ser: 1.2 mg/dL (ref 0.61–1.24)
GFR, Estimated: >60 mL/min (ref >60)
Glucose, Bld: 98 mg/dL (ref 70–99)
Potassium: 3.3 mmol/L — ABNORMAL LOW (ref 3.5–5.1)
Sodium: 131 mmol/L — ABNORMAL LOW (ref 135–145)
Total Bilirubin: 2.3 mg/dL — ABNORMAL HIGH (ref 0.3–1.2)
Total Protein: 7.9 g/dL (ref 6.5–8.1)

## 2022-09-24 LAB — GLUCOSE, CAPILLARY
Glucose-Capillary: 103 mg/dL — ABNORMAL HIGH (ref 70–99)
Glucose-Capillary: 139 mg/dL — ABNORMAL HIGH (ref 70–99)

## 2022-09-24 LAB — MAGNESIUM: Magnesium: 2 mg/dL (ref 1.7–2.4)

## 2022-09-24 MED ORDER — OXYCODONE HCL 5 MG PO TABS: 5.0000 mg | ORAL_TABLET | Freq: Three times a day (TID) | ORAL | 0 refills | Status: DC | PRN

## 2022-09-24 MED ORDER — VANCOMYCIN HCL 1500 MG/300ML IV SOLN
1500.0000 mg | INTRAVENOUS | Status: DC
  Filled 2022-09-24: qty 300

## 2022-09-24 MED ORDER — DOXYCYCLINE HYCLATE 100 MG PO TABS: 100.0000 mg | ORAL_TABLET | Freq: Two times a day (BID) | ORAL | 0 refills | Status: DC

## 2022-09-24 MED ORDER — POTASSIUM CHLORIDE CRYS ER 20 MEQ PO TBCR
40.0000 meq | EXTENDED_RELEASE_TABLET | ORAL | Status: AC
  Administered 2022-09-24 (×2): 40 meq via ORAL
  Filled 2022-09-24 (×2): qty 2

## 2022-09-24 MED ORDER — FENTANYL 75 MCG/HR TD PT72: 1.0000 | MEDICATED_PATCH | TRANSDERMAL | 0 refills | Status: DC

## 2022-09-24 NOTE — TOC Transition Note (Signed)
Transition of Care [facility]) - CM/SW Discharge Note   Patient Details  Name: [name] MRN: [id number] Date of Birth: 03-Jul-1955  Transition of Care [facility]) CM/SW Contact:  Verna Czech [place], [place] Phone Number: 09/24/2022, 12:41 PM   Clinical Narrative:     Patient to return to LTC bed [facility]. Patient will be going to room 712A.[name] Report to be called in to [phone]. [facility] contacted for transport back to [facility].  Dekker Verga, LCSW Transition of Care     Barriers to Discharge: Continued Medical Work up   Patient Goals and CMS Choice CMS Medicare.gov Compare Post Acute Care list provided to:: Patient Choice offered to / list presented to : Patient  Discharge Placement                         Discharge Plan and Services Additional resources added to the After Visit Summary for       Post Acute Care Choice: Skilled Nursing Facility                               Social Determinants of Health (SDOH) Interventions SDOH Screenings   Food Insecurity: No Food Insecurity (09/23/2022)  Housing: Low Risk  (09/23/2022)  Transportation Needs: No Transportation Needs (09/23/2022)  Utilities: Not At Risk (09/23/2022)  Depression (PHQ2-9): High Risk (10/04/2021)  Financial Resource Strain: High Risk (12/27/2020)  Physical Activity: Unknown (02/20/2018)  Social Connections: Unknown (02/20/2018)  Tobacco Use: High Risk (09/24/2022)     Readmission Risk Interventions     No data to display

## 2022-09-24 NOTE — Progress Notes (Signed)
Dilaudid  mg given as ordered. For pain 8-9/10

## 2022-09-24 NOTE — Plan of Care (Signed)
  Problem: Education: Goal: Knowledge of General Education information will improve Description Including pain rating scale, medication(s)/side effects and non-pharmacologic comfort measures Outcome: Progressing   Problem: Health Behavior/Discharge Planning: Goal: Ability to manage health-related needs will improve Outcome: Progressing   

## 2022-09-24 NOTE — Discharge Summary (Addendum)
Physician Discharge Summary  [name] ZOX:[id number] DOB: December 31, 1955 DOA: 09/22/2022  PCP: [name], MD  Admit date: 09/22/2022 Discharge date: 09/24/2022  Admitted From: (Home, SNF) Disposition:  (Home )  Recommendations for Outpatient Follow-up:  Please obtain BMP/CBC in 3 days Aspirin has been discontinued at time of discharge, continue to hold Plavix for next 14 days, and then can be resumed after that   Discharge Condition: (Stable) CODE STATUS : DNR, MOST form will be sent with the patient as well Diet recommendation: Heart Healthy / Carb Modified  Brief/Interim Summary:  67 y.o. male with medical history significant of  cirrhosis not alcohol related, COPD, history of coronary artery disease with MI, HTN, AAA, CHF, T2DM, spinal stenosis presented with fall at SNF with trauma to face and significant bleeding.  On presentation, he was found to have acute kidney injury with creatinine of 1.7, lactic acid of 2.6.  CT of the head showed a 4 mm subdural hematoma.  Neurosurgery recommended conservative management with repeat CT scan in 6 hours with no intervention if remains stable.  Significant changes on repeat imaging, no further intervention mended by neurosurgery, recommendation to hold Plavix for 2 weeks postinjury.   Subdural hematoma -Most likely traumatic from fall -CT of the head showed a 4 mm subdural hematoma.  Neurosurgery recommended conservative management with repeat CT scan in 6 hours with no intervention if remains stable.  Repeat imaging looks stable, neurosurgery input greatly appreciated, no need for further follow-up imaging, or any type of neurosurgical interventions, recommendation to hold Plavix for 2 weeks postsurgery .   Cellulitis of scrotum -has some cellulitis  in the scrotum.  Deceived IV antibiotics, this appears to be much improved upon my exam today, he will be discharged on 4 days of oral doxycycline   Chronic combined systolic and  diastolic heart failure Hypertension CAD: Currently stable Hyperlipidemia: Intolerant to statin, continue low-cholesterol diet -  Continue spironolactone and torsemide  Outpatient follow-up with cardiology   Cirrhosis of liver, unspecified cause Portal hypertension with splenomegaly, thrombocytopenia and esophageal varices Probable hepatic encephalopathy history -Continue diuretics and rifaximin along with lactulose.  Outpatient follow-up with GI.   COPD -Currently stable.  Continue current inhaled regimen.   Anemia of chronic disease Macrocytosis -Possibly from chronic illnesses.  Hemoglobin stable.  Monitor intermittently   Thrombocytopenia -CT from cirrhosis of liver and splenomegaly.  Monitor.     Fall at [facility] -Possibly mechanical.  Fall precautions.  PT eval.  TOC consult, PT/OT to be arranged at SNF.   Tobacco abuse -Currently on nicotine patch.   Chronic pain -Continue gabapentin, fentanyl patch and oxycodone as needed   Obesity -Outpatient follow-up  Hypokalemia - repleted  Hyponatremia - in the setting of liver discease  Goals of care -followed by palliative medicine during hospital stay, patient DNR/DNI, and MOST form has been filled.  Discharge Diagnoses:  Principal Problem:   Subdural hematoma Active Problems:   Cellulitis, scrotum   Essential hypertension   CAD (coronary artery disease)   MDD (major depressive disorder), recurrent severe, without psychosis   HLD (hyperlipidemia)   Smoker   Cirrhosis of liver not due to alcohol   Chronic combined systolic and diastolic heart failure   COPD (chronic obstructive pulmonary disease)   Thrombocytopenia   Obesity, morbid, BMI 40.0-49.9   Charcot-Marie-Tooth disease   Cervical spondylosis   Abdominal aortic aneurysm (AAA) without rupture   Fall at home, initial encounter   Portal hypertension   SDH (subdural hematoma)  Discharge Instructions  Discharge Instructions     Diet - low sodium  heart healthy   Complete by: As directed    Discharge instructions   Complete by: As directed    Follow with Primary MD [name], MD in 7 days   Get CBC, CMP, checked  by Primary MD next visit.    Activity: Full fall precautions   Disposition snf   Diet: 2 g sodium diet/carb modified  For Heart failure patients - Check your Weight same time everyday, if you gain over 2 pounds, or you develop in leg swelling, experience more shortness of breath or chest pain, call your Primary MD immediately. Follow Cardiac Low Salt Diet and 1.5 lit/day fluid restriction.   On your next visit with your primary care physician please Get Medicines reviewed and adjusted.   Please request your Prim.MD to go over all Hospital Tests and Procedure/Radiological results at the follow up, please get all Hospital records sent to your Prim MD by signing hospital release before you go home.   If you experience worsening of your admission symptoms, develop shortness of breath, life threatening emergency, suicidal or homicidal thoughts you must seek medical attention immediately by calling 911 or calling your MD immediately  if symptoms less severe.  You Must read complete instructions/literature along with all the possible adverse reactions/side effects for all the Medicines you take and that have been prescribed to you. Take any new Medicines after you have completely understood and accpet all the possible adverse reactions/side effects.   Do not drive, operating heavy machinery, perform activities at heights, swimming or participation in water activities or provide baby sitting services if your were admitted for syncope or siezures until you have seen by Primary MD or a Neurologist and advised to do so again.  Do not drive when taking Pain medications.    Do not take more than prescribed Pain, Sleep and Anxiety Medications  Special Instructions: If you have smoked or chewed Tobacco  in the last 2 yrs  please stop smoking, stop any regular Alcohol  and or any Recreational drug use.  Wear Seat belts while driving.   Please note  You were cared for by a hospitalist during your hospital stay. If you have any questions about your discharge medications or the care you received while you were in the hospital after you are discharged, you can call the unit and asked to speak with the hospitalist on call if the hospitalist that took care of you is not available. Once you are discharged, your primary care physician will handle any further medical issues. Please note that NO REFILLS for any discharge medications will be authorized once you are discharged, as it is imperative that you return to your primary care physician (or establish a relationship with a primary care physician if you do not have one) for your aftercare needs so that they can reassess your need for medications and monitor your lab values.   Increase activity slowly   Complete by: As directed    No wound care   Complete by: As directed       Allergies as of 09/24/2022       Reactions   Statins Shortness Of Breath   Cough, trouble breathing Cough, trouble breathing   Losartan Other (See Comments)   Causes him to have pain   Wellbutrin [bupropion] Other (See Comments)   Worsened mood - crying   Allopurinol Nausea Only   Baclofen Nausea And Vomiting  Penicillins Nausea And Vomiting   Did it involve swelling of the face/tongue/throat, SOB, or low BP? N/A Did it involve sudden or severe rash/hives, skin peeling, or any reaction on the inside of your mouth or nose? N/A Did you need to seek medical attention at a hospital or doctor's office? N/A When did it last happen? Child     If all above answers are "NO", may proceed with cephalosporin use.   Tramadol Nausea Only        Medication List     STOP taking these medications    aspirin EC 81 MG tablet   clopidogrel 75 MG tablet Commonly known as: PLAVIX   furosemide  40 MG tablet Commonly known as: LASIX   omeprazole 40 MG capsule Commonly known as: PRILOSEC       TAKE these medications    albuterol (2.5 MG/3ML) 0.083% nebulizer solution Commonly known as: PROVENTIL USE 1 VIAL PER NEBULIZER EVERY 6 HRS AS NEEDED FOR WHEEZING What changed:  how much to take how to take this when to take this reasons to take this additional instructions   albuterol 108 (90 Base) MCG/ACT inhaler Commonly known as: VENTOLIN HFA TAKE 2 PUFFS BY MOUTH EVERY 6 HOURS AS NEEDED FOR WHEEZE OR SHORTNESS OF BREATH What changed: See the new instructions.   carvedilol 3.125 MG tablet Commonly known as: COREG Take 3.125 mg by mouth 2 (two) times daily.   cyanocobalamin 500 MCG tablet Commonly known as: VITAMIN B12 Take 1 tablet (500 mcg total) by mouth every Monday, Wednesday, and Friday.   doxycycline 100 MG tablet Commonly known as: VIBRA-TABS Take 1 tablet (100 mg total) by mouth 2 (two) times daily for 4 days.   fentaNYL 75 MCG/HR Commonly known as: DURAGESIC Place 1 patch onto the skin every 3 (three) days.   ferrous sulfate 325 (65 FE) MG tablet Take 1 tablet (325 mg total) by mouth every other day.   folic acid 1 MG tablet Commonly known as: FOLVITE TAKE 1 TABLET BY MOUTH EVERY DAY   gabapentin 300 MG capsule Commonly known as: NEURONTIN Take 1 capsule (300 mg total) by mouth in the morning and at bedtime.   lactulose 10 GM/15ML solution Commonly known as: CHRONULAC Take 30 mLs by mouth 4 (four) times daily.   Lactulose 20 GM/30ML Soln Take 45 mLs by mouth in the morning, at noon, and at bedtime.   metolazone 2.5 MG tablet Commonly known as: ZAROXOLYN Take 2.5 mg by mouth. Mondays and thursdays   nicotine 14 mg/24hr patch Commonly known as: NICODERM CQ - dosed in mg/24 hours APPLY 1 PATCH ONTO THE SKIN EVERY DAY What changed: See the new instructions.   nitroGLYCERIN 0.4 MG SL tablet Commonly known as: NITROSTAT Place 1 tablet (0.4  mg total) under the tongue every 5 (five) minutes as needed for chest pain.   Nyamyc powder Generic drug: nystatin Apply 1 Application topically 3 (three) times daily.   ondansetron 4 MG tablet Commonly known as: ZOFRAN TAKE 1 TABLET BY MOUTH EVERY 8 HOURS AS NEEDED FOR NAUSEA AND VOMITING   oxyCODONE 5 MG immediate release tablet Commonly known as: Oxy IR/ROXICODONE Take 1 tablet (5 mg total) by mouth every 8 (eight) hours as needed for severe pain. What changed: when to take this   pantoprazole 40 MG tablet Commonly known as: PROTONIX Take 40 mg by mouth daily.   Spiriva Respimat 2.5 MCG/ACT Aers Generic drug: Tiotropium Bromide Monohydrate Inhale 2 puffs into the lungs daily.  spironolactone 100 MG tablet Commonly known as: ALDACTONE Take 0.5 tablets (50 mg total) by mouth daily. What changed: how much to take   TART CHERRY PO Take 1 tablet by mouth in the morning and at bedtime.   torsemide 20 MG tablet Commonly known as: DEMADEX Take 20 mg by mouth daily.   Xifaxan 550 MG Tabs tablet Generic drug: rifaximin Take 550 mg by mouth 2 (two) times daily.        Contact information for after-discharge care     Destination     [facility] Preferred SNF .   Service: Skilled Nursing Contact information: [address] [place] [address] [phone]                    Allergies  Allergen Reactions   Statins Shortness Of Breath    Cough, trouble breathing Cough, trouble breathing   Losartan Other (See Comments)    Causes him to have pain   Wellbutrin [Bupropion] Other (See Comments)    Worsened mood - crying   Allopurinol Nausea Only   Baclofen Nausea And Vomiting   Penicillins Nausea And Vomiting    Did it involve swelling of the face/tongue/throat, SOB, or low BP? N/A Did it involve sudden or severe rash/hives, skin peeling, or any reaction on the inside of your mouth or nose? N/A Did you need  to seek medical attention at a hospital or doctor's office? N/A When did it last happen? Child     If all above answers are "NO", may proceed with cephalosporin use.   Tramadol Nausea Only    Consultations: Neurosurgery Palliaitve medicine   Procedures/Studies: US SCROTUM  Result Date: 09/23/2022 CLINICAL DATA:  [id number] Cellulitis 92319 EXAM: ULTRASOUND OF SCROTUM TECHNIQUE: Complete ultrasound examination of the testicles, epididymis, and other scrotal structures was performed. COMPARISON:  01/31/2022 FINDINGS: Right testicle Measurements: 2.5 x 1.2 x 2.1 cm. No mass or microlithiasis visualized. Left testicle Measurements: 3.0 x 1.5 x 2.0 cm. No mass or microlithiasis visualized. Right epididymis: 6 mm epididymal head cyst. Otherwise normal in size and appearance. Left epididymis:  Normal in size and appearance. Hydrocele:  None visualized. Varicocele:  None visualized. Other: Skin thickening and mild soft tissue edema inferior to the scrotum. No organized fluid collection. IMPRESSION: 1. Negative for testicular mass or other significant abnormality. 2. Skin thickening and mild soft tissue edema inferior to the scrotum. No organized fluid collection. Electronically Signed   By: Duanne Guess D.O.   On: 09/23/2022 14:25   CT HEAD WO CONTRAST ( )  Result Date: 09/23/2022 CLINICAL DATA:  66 year old male status post fall on Plavix. Para falcine subdural hematoma on head CT yesterday. EXAM: CT HEAD WITHOUT CONTRAST TECHNIQUE: Contiguous axial images were obtained from the base of the skull through the vertex without intravenous contrast. RADIATION DOSE REDUCTION: This exam was performed according to the departmental dose-optimization program which includes automated exposure control, adjustment of the mA and/or kV according to patient size and/or use of iterative reconstruction technique. COMPARISON:  CT head 09/22/2022. FINDINGS: Brain: Lobulated and hyperdense posterior and superior left para  falcine subdural blood has not significantly changed and is generally 4 mm thickness or less. There is a superior focus of 5-6 mm in thickness on series 4, image 26. There is questionable trace subarachnoid hemorrhage superimposed (single sulcus right hemisphere coronal image 52). No intraventricular hemorrhage. No significant intracranial mass effect. No ventriculomegaly. Stable gray-white matter differentiation throughout the brain. No cortically based acute  infarct identified. Vascular: Calcified atherosclerosis at the skull base. No suspicious intracranial vascular hyperdensity. Skull: Stable, intact. Sinuses/Orbits: Paranasal sinuses, tympanic cavities and mastoids are stable and well aerated. Other: Right forehead scalp hematoma or contusion on series 3, image 55 is stable. Orbits soft tissues appear negative. IMPRESSION: 1. Small volume Left parafalcine subdural hematoma not significantly changed, 4 mm and occasionally up to 6 mm in thickness. 2. Possible trace SAH in the right hemisphere (single sulcus involvement). 3. No significant intracranial mass effect. No other acute intracranial abnormality. 4. Right forehead scalp hematoma. Electronically Signed   By: Odessa Fleming M.D.   On: 09/23/2022 04:48   CT HEAD WO CONTRAST  Result Date: 09/22/2022 CLINICAL DATA:  Trauma EXAM: CT HEAD WITHOUT CONTRAST CT MAXILLOFACIAL WITHOUT CONTRAST CT CERVICAL SPINE WITHOUT CONTRAST TECHNIQUE: Multidetector CT imaging of the head, cervical spine, and maxillofacial structures were performed using the standard protocol without intravenous contrast. Multiplanar CT image reconstructions of the cervical spine and maxillofacial structures were also generated. RADIATION DOSE REDUCTION: This exam was performed according to the departmental dose-optimization program which includes automated exposure control, adjustment of the mA and/or kV according to patient size and/or use of iterative reconstruction technique. COMPARISON:  CT C  spine 07/14/20 FINDINGS: CT HEAD FINDINGS Brain: No CT evidence of an acute cortical infarct. No hydrocephalus. No evidence of intraventricular blood products. 4 mm subdural hematoma along the falx (series 5, image 34). Vascular: No hyperdense vessel or unexpected calcification. Skull: Soft tissue swelling along the right frontal scalp. Other: None. CT MAXILLOFACIAL FINDINGS Osseous: No fracture or mandibular dislocation. No destructive process. Orbits: Negative. No traumatic or inflammatory finding. Sinuses: No middle ear or mastoid effusion. Paranasal sinuses are clear orbits are unremarkable. Soft tissues: Negative. CT CERVICAL SPINE FINDINGS Limitations: Assessment is limited due to the degree of motion artifact Alignment: Straightening of the normal cervical lordosis. Skull base and vertebrae: Status post C4-C7 ACDF with interbody spacer placement. There are irregular degenerative changes of the right-sided facet joint at C4-C5 with a Soft tissues and spinal canal: No prevertebral fluid or swelling. No visible canal hematoma. Disc levels: There is likely severe spinal canal stenosis at the C5-C6 level secondary to OPLL. Upper chest: Negative. Other: Likely small intraparotid lymph nodes in the left. IMPRESSION: CT HEAD: 4 mm subdural hematoma along the falx. No significant mass effect. No intraventricular hemorrhage. CT MAXILLOFACIAL: Soft tissue swelling along the right frontal scalp. No acute facial fracture. CT CERVICAL SPINE: Assessment is limited due to the degree of motion artifact. 1. No acute fracture or traumatic listhesis in the cervical spine. 2. Status post C4-C7 ACDF with interbody spacer placement. 3. Irregular degenerative changes of the right-sided facet joint at C4-C5. 4. Likely severe spinal canal stenosis at the C5-C6 level secondary to OPLL. If the patient has myelopathic symptoms, this could be further assessed with a cervical spine MRI. Electronically Signed   By: Lorenza Cambridge M.D.   On:  09/22/2022 18:38   CT MAXILLOFACIAL WO CONTRAST  Result Date: 09/22/2022 CLINICAL DATA:  Trauma EXAM: CT HEAD WITHOUT CONTRAST CT MAXILLOFACIAL WITHOUT CONTRAST CT CERVICAL SPINE WITHOUT CONTRAST TECHNIQUE: Multidetector CT imaging of the head, cervical spine, and maxillofacial structures were performed using the standard protocol without intravenous contrast. Multiplanar CT image reconstructions of the cervical spine and maxillofacial structures were also generated. RADIATION DOSE REDUCTION: This exam was performed according to the departmental dose-optimization program which includes automated exposure control, adjustment of the mA and/or kV according to patient size  and/or use of iterative reconstruction technique. COMPARISON:  CT C spine 07/14/20 FINDINGS: CT HEAD FINDINGS Brain: No CT evidence of an acute cortical infarct. No hydrocephalus. No evidence of intraventricular blood products. 4 mm subdural hematoma along the falx (series 5, image 34). Vascular: No hyperdense vessel or unexpected calcification. Skull: Soft tissue swelling along the right frontal scalp. Other: None. CT MAXILLOFACIAL FINDINGS Osseous: No fracture or mandibular dislocation. No destructive process. Orbits: Negative. No traumatic or inflammatory finding. Sinuses: No middle ear or mastoid effusion. Paranasal sinuses are clear orbits are unremarkable. Soft tissues: Negative. CT CERVICAL SPINE FINDINGS Limitations: Assessment is limited due to the degree of motion artifact Alignment: Straightening of the normal cervical lordosis. Skull base and vertebrae: Status post C4-C7 ACDF with interbody spacer placement. There are irregular degenerative changes of the right-sided facet joint at C4-C5 with a Soft tissues and spinal canal: No prevertebral fluid or swelling. No visible canal hematoma. Disc levels: There is likely severe spinal canal stenosis at the C5-C6 level secondary to OPLL. Upper chest: Negative. Other: Likely small intraparotid  lymph nodes in the left. IMPRESSION: CT HEAD: 4 mm subdural hematoma along the falx. No significant mass effect. No intraventricular hemorrhage. CT MAXILLOFACIAL: Soft tissue swelling along the right frontal scalp. No acute facial fracture. CT CERVICAL SPINE: Assessment is limited due to the degree of motion artifact. 1. No acute fracture or traumatic listhesis in the cervical spine. 2. Status post C4-C7 ACDF with interbody spacer placement. 3. Irregular degenerative changes of the right-sided facet joint at C4-C5. 4. Likely severe spinal canal stenosis at the C5-C6 level secondary to OPLL. If the patient has myelopathic symptoms, this could be further assessed with a cervical spine MRI. Electronically Signed   By: Lorenza Cambridge M.D.   On: 09/22/2022 18:38   CT CERVICAL SPINE WO CONTRAST  Result Date: 09/22/2022 CLINICAL DATA:  Trauma EXAM: CT HEAD WITHOUT CONTRAST CT MAXILLOFACIAL WITHOUT CONTRAST CT CERVICAL SPINE WITHOUT CONTRAST TECHNIQUE: Multidetector CT imaging of the head, cervical spine, and maxillofacial structures were performed using the standard protocol without intravenous contrast. Multiplanar CT image reconstructions of the cervical spine and maxillofacial structures were also generated. RADIATION DOSE REDUCTION: This exam was performed according to the departmental dose-optimization program which includes automated exposure control, adjustment of the mA and/or kV according to patient size and/or use of iterative reconstruction technique. COMPARISON:  CT C spine 07/14/20 FINDINGS: CT HEAD FINDINGS Brain: No CT evidence of an acute cortical infarct. No hydrocephalus. No evidence of intraventricular blood products. 4 mm subdural hematoma along the falx (series 5, image 34). Vascular: No hyperdense vessel or unexpected calcification. Skull: Soft tissue swelling along the right frontal scalp. Other: None. CT MAXILLOFACIAL FINDINGS Osseous: No fracture or mandibular dislocation. No destructive process.  Orbits: Negative. No traumatic or inflammatory finding. Sinuses: No middle ear or mastoid effusion. Paranasal sinuses are clear orbits are unremarkable. Soft tissues: Negative. CT CERVICAL SPINE FINDINGS Limitations: Assessment is limited due to the degree of motion artifact Alignment: Straightening of the normal cervical lordosis. Skull base and vertebrae: Status post C4-C7 ACDF with interbody spacer placement. There are irregular degenerative changes of the right-sided facet joint at C4-C5 with a Soft tissues and spinal canal: No prevertebral fluid or swelling. No visible canal hematoma. Disc levels: There is likely severe spinal canal stenosis at the C5-C6 level secondary to OPLL. Upper chest: Negative. Other: Likely small intraparotid lymph nodes in the left. IMPRESSION: CT HEAD: 4 mm subdural hematoma along the falx. No significant  mass effect. No intraventricular hemorrhage. CT MAXILLOFACIAL: Soft tissue swelling along the right frontal scalp. No acute facial fracture. CT CERVICAL SPINE: Assessment is limited due to the degree of motion artifact. 1. No acute fracture or traumatic listhesis in the cervical spine. 2. Status post C4-C7 ACDF with interbody spacer placement. 3. Irregular degenerative changes of the right-sided facet joint at C4-C5. 4. Likely severe spinal canal stenosis at the C5-C6 level secondary to OPLL. If the patient has myelopathic symptoms, this could be further assessed with a cervical spine MRI. Electronically Signed   By: Lorenza Cambridge M.D.   On: 09/22/2022 18:38   CT CHEST ABDOMEN PELVIS W CONTRAST  Result Date: 09/22/2022 CLINICAL DATA:  Trauma. EXAM: CT CHEST, ABDOMEN, AND PELVIS WITH CONTRAST TECHNIQUE: Multidetector CT imaging of the chest, abdomen and pelvis was performed following the standard protocol during bolus administration of intravenous contrast. RADIATION DOSE REDUCTION: This exam was performed according to the departmental dose-optimization program which includes  automated exposure control, adjustment of the mA and/or kV according to patient size and/or use of iterative reconstruction technique. CONTRAST:  75mL OMNIPAQUE IOHEXOL 350 MG/ML SOLN COMPARISON:  CT abdomen pelvis dated 05/03/2022. FINDINGS: CT CHEST FINDINGS Cardiovascular: There is no cardiomegaly or pericardial effusion. There is 3 vessel coronary vascular calcification. Left pectoral pacemaker device. Mild atherosclerotic calcification of the thoracic aorta. No aneurysmal dilatation. The central pulmonary arteries are grossly unremarkable. Mediastinum/Nodes: No hilar or mediastinal adenopathy. The esophagus is grossly unremarkable. No mediastinal fluid collection. Lungs/Pleura: Right lung base linear atelectasis or scarring. No focal consolidation, pleural effusion, or pneumothorax. The central airways are patent. Musculoskeletal: Several old right rib fractures. No acute osseous pathology. Contusion of the soft tissues of the right breast, likely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

## 2022-09-24 NOTE — TOC Initial Note (Signed)
Transition of Care [facility]) - Initial/Assessment Note    Patient Details  Name: [name] MRN: [id number] Date of Birth: Oct 17, 1955  [facility] CM/SW Contact:    Baldemar Lenis, LCSW Phone Number: 09/24/2022, 10:03 AM  Clinical Narrative:     Patient from [facility], can return at discharge. May benefit from rehab post-fall, can request insurance authorization when closer to medically stable. CSW to follow.             Expected Discharge Plan: Skilled Nursing Facility Barriers to Discharge: Continued Medical Work up   Patient Goals and CMS Choice Patient states their goals for this hospitalization and ongoing recovery are:: to have less pain CMS Medicare.gov Compare Post Acute Care list provided to:: Patient Choice offered to / list presented to : Patient [place] ownership interest in [facility].provided to:: Patient    Expected Discharge Plan and Services     Post Acute Care Choice: Skilled Nursing Facility Living arrangements for the past 2 months: Skilled Nursing Facility                                      Prior Living Arrangements/Services Living arrangements for the past 2 months: Skilled Nursing Facility Lives with:: Facility Resident Patient language and need for interpreter reviewed:: No Do you feel safe going back to the place where you live?: Yes      Need for Family Participation in Patient Care: No (Comment) Care giver support system in place?: Yes (comment)   Criminal Activity/Legal Involvement Pertinent to Current Situation/Hospitalization: No - Comment as needed  Activities of Daily Living Home Assistive Devices/Equipment: Dan Humphreys (specify type) ADL Screening (condition at time of admission) Patient's cognitive ability adequate to safely complete daily activities?: Yes Is the patient deaf or have difficulty hearing?: No Does the patient have difficulty seeing, even when wearing glasses/contacts?:  No Does the patient have difficulty concentrating, remembering, or making decisions?: No Patient able to express need for assistance with ADLs?: Yes Does the patient have difficulty dressing or bathing?: Yes Independently performs ADLs?: No Communication: Independent Dressing (OT): Needs assistance Is this a change from baseline?: Pre-admission baseline Grooming: Needs assistance Is this a change from baseline?: Pre-admission baseline Feeding: Independent with device (comment) Bathing: Needs assistance Is this a change from baseline?: Pre-admission baseline In/Out Bed: Needs assistance Is this a change from baseline?: Pre-admission baseline Does the patient have difficulty walking or climbing stairs?: Yes Weakness of Legs: Both Weakness of Arms/Hands: Both  Permission Sought/Granted Permission sought to share information with : Oceanographer granted to share information with : Yes, Verbal Permission Granted     Permission granted to share info w AGENCY: Joetta Manners        Emotional Assessment   Attitude/Demeanor/Rapport: Engaged Affect (typically observed): Appropriate Orientation: : Oriented to Self, Oriented to Place, Oriented to  Time, Oriented to Situation Alcohol / Substance Use: Not Applicable Psych Involvement: No (comment)  Admission diagnosis:  Subdural hematoma [S06.5XAA] Fall, initial encounter [id number]] Laceration of nose, initial encounter [S01.21XA] SDH (subdural hematoma) [S06.5XAA] Patient Active Problem List   Diagnosis Date Noted   SDH (subdural hematoma) 09/23/2022   Subdural hematoma 09/22/2022   Encephalopathy 07/08/2022   Portal hypertension 07/08/2022   Closed dislocation of right hip 07/08/2022   Falls 07/08/2022   Bilateral cellulitis of lower leg 06/07/2022   COVID-19 virus infection 05/04/2022  Hepatic encephalopathy 04/15/2022   Hospice care patient 02/01/2022   Cellulitis of perineum    Scrotal swelling     Scrotal pain    Edema    Chronic combined systolic and diastolic heart failure 01/31/2022   Cellulitis, scrotum 01/31/2022   Venous stasis ulcer of left lower leg with edema of left lower leg 01/06/2022   Lymphadenopathy of head and neck region 12/15/2021   FTT (failure to thrive) in adult 11/16/2021   Driving safety issue 16/03/9603   Decreased visual acuity 10/06/2021   Cognitive safety issue 06/27/2021   Status post right hip replacement 12/04/2020   Carotid stenosis, asymptomatic, bilateral 08/28/2020   Statin intolerance 08/05/2020   Mass of both parotid glands 08/05/2020   Memory deficit 01/10/2020   Fall at home, initial encounter 02/04/2019   Pancytopenia 09/18/2018   Hearing loss 08/07/2018   Abdominal aortic aneurysm (AAA) without rupture 01/02/2018   History of MRSA infection 01/02/2018   History of myocardial infarction 01/02/2018   Aortic atherosclerosis 01/01/2018   MDD (major depressive disorder), recurrent severe, without psychosis 11/17/2017   Candidal intertrigo 05/08/2017   Falls frequently 03/13/2017   Abnormal drug screen 07/09/2016   Thrombocytopenia 04/29/2016   Anemia of chronic disease 04/29/2016   Protein-calorie malnutrition 04/29/2016   RUQ abdominal pain 04/26/2016   Encounter for chronic pain management 01/04/2016   Preop cardiovascular exam 09/01/2014   Generalized weakness 07/22/2014   Esophageal dysphagia 07/22/2014   Gross hematuria 05/19/2014   Encounter for general adult medical examination with abnormal findings 04/21/2014   Advanced care planning/counseling discussion 04/21/2014   Orthostatic hypotension 03/13/2014   Cirrhosis of liver not due to alcohol 01/03/2014   Osteoarthritis of right hip 08/16/2012   Medicare annual wellness visit, subsequent 06/07/2012   Chronic dyspnea 10/06/2011   DDD (degenerative disc disease), cervical    Vitamin D deficiency 03/14/2011   Vitamin B12 deficiency 08/05/2010   Prediabetes 06/27/2010    Cervical spondylosis 05/05/2010   Obesity, morbid, BMI 40.0-49.9 05/03/2010   Hidradenitis 05/03/2010   Smoker 10/21/2009   HLD (hyperlipidemia) 09/07/2009   Gout 09/07/2009   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 [place] [address] Phone: [phone] Fax: [phone]  [facility] - [place], [place] - [id number], [address] [place] [address] Phone: [phone] Fax: [phone]  [facility] [address] [place] [address] Phone: [phone] Fax: [phone]  [facility] [place], [place] - [address] [place] [address] Phone: [phone] Fax: [phone]     Social Determinants of Health (SDOH) Social History: SDOH Screenings   Food Insecurity: No Food Insecurity (09/23/2022)  Housing: Low Risk  (09/23/2022)  Transportation Needs: No Transportation Needs (09/23/2022)  Utilities: Not At Risk (09/23/2022)  Depression (PHQ2-9): High Risk (10/04/2021)  Financial Resource Strain: High Risk (12/27/2020)  Physical Activity: Unknown (02/20/2018)  Social Connections: Unknown (02/20/2018)  Tobacco Use: High Risk (09/24/2022)   SDOH Interventions:     Readmission Risk Interventions     No data to display

## 2022-09-24 NOTE — Progress Notes (Signed)
Daily Progress Note   Patient Name: [name]       Date: 09/24/2022 DOB: [birth year]  Age: 67 y.o. MRN#: [id number] Attending Physician: Starleen Arms, MD Primary Care Physician: [name], MD Admit Date: 09/22/2022  Reason for Consultation/Follow-up: Establishing goals of care  Subjective: I have reviewed medical records including EPIC notes, MAR, and labs. Received report from primary RN - no acute concerns. RN reports patient is more awake today and eating/drinking well.  Went to visit patient at bedside - no family/visitors present.  Patient was lying in bed awake, alert, oriented, and able to participate in conversation; though his thoughts are scattered, he is easily redirectable. No signs or non-verbal gestures of pain or discomfort noted. No respiratory distress, increased work of breathing, or secretions noted.   Emotional support provided to patient -he is open for discussions today.  Patient is not married -he has no children.  Prior to hospitalization he was living at [facility] for long-term care where he has been for 5 months; however, indicates multiple times during our visit that his goal ultimately is to return home.  He was able to ambulate with a walker and dress/bathe himself.  Therapeutic listening provided as he reflected on his time playing country music and traveling.  MOST form reintroduced -he does not remember filling out this form in 2022.  Reviewed in detail concepts specific to code status, advance directives, artificial nutrition and hydration, and rehospitalization's.  MOST form finalized as outlined under Recommendation section.  Patient tells me that he is "not happy like this" and "wants to spend his last months at home."  Patient has a clear  understanding of his chronic diseases.  Offered outpatient palliative care to follow at discharge -he is agreeable and appreciative.  Patient indicates that his sister/Rebecca "is in charge" regarding assisting with his medical decision making.  He indicates he would want her to be his surrogate decision maker if needed - obtained phone number and will add her name and number to his emergency contact list.  Dr. Randol Kern informed patient plan is for discharge today.  All questions and concerns addressed. Encouraged to call with questions and/or concerns. PMT card provided.    Length of Stay: 1  Current Medications: Scheduled Meds:   cyanocobalamin  500 mcg Oral Q M,W,F  fentaNYL  1 patch Transdermal Q72H   ferrous sulfate  325 mg Oral QODAY   folic acid  1 mg Oral Daily   furosemide  80 mg Oral Daily   gabapentin  300 mg Oral BID   Gerhardt's butt cream   Topical TID   insulin aspart  0-15 Units Subcutaneous TID WC   lactulose  20 g Oral QID   nicotine  14 mg Transdermal Daily   pantoprazole  40 mg Oral Daily   potassium chloride  40 mEq Oral Q4H   rifaximin  550 mg Oral BID   senna-docusate  2 tablet Oral QHS   sodium chloride flush  3 mL Intravenous Q12H   spironolactone  50 mg Oral Daily   thiamine  100 mg Oral Daily   umeclidinium bromide  1 puff Inhalation Daily    Continuous Infusions:  sodium chloride     cefTRIAXone (ROCEPHIN)  IV Stopped (09/23/22 1303)   vancomycin      PRN Meds: sodium chloride, albuterol, HYDROmorphone (DILAUDID) injection, metoprolol tartrate, nitroGLYCERIN, ondansetron, oxyCODONE, polyethylene glycol, sodium chloride flush  Physical Exam Vitals and nursing note reviewed.  Constitutional:      General: He is not in acute distress. Pulmonary:     Effort: No respiratory distress.  Skin:    General: Skin is warm and dry.  Neurological:     Mental Status: He is alert and oriented to person, place, and time.     Motor: Weakness present.   Psychiatric:        Attention and Perception: Attention normal.        Behavior: Behavior is cooperative.        Cognition and Memory: Cognition and memory normal.             Vital Signs: BP 123/67 (BP Location: Left Arm)   Pulse 77   Temp 98.1 F (36.7 C) (Oral)   Resp 16   Ht 5\' 9"  (1.753 m)   Wt 131.6 kg   SpO2 94%   BMI 42.84 kg/m  SpO2: SpO2: 94 % O2 Device: O2 Device: Room Air O2 Flow Rate:    Intake/output summary:  Intake/Output Summary (Last 24 hours) at 09/24/2022 1055 Last data filed at 09/24/2022 1478 Gross per 24 hour  Intake 469.02 ml  Output 900 ml  Net -430.98 ml   LBM: Last BM Date : 09/23/22 Baseline Weight: Weight: 102.1 kg Most recent weight: Weight: 131.6 kg       Palliative Assessment/Data: PPS 50-60%      Patient Active Problem List   Diagnosis Date Noted   SDH (subdural hematoma) 09/23/2022   Subdural hematoma 09/22/2022   Encephalopathy 07/08/2022   Portal hypertension 07/08/2022   Closed dislocation of right hip 07/08/2022   Falls 07/08/2022   Bilateral cellulitis of lower leg 06/07/2022   COVID-19 virus infection 05/04/2022   Hepatic encephalopathy 04/15/2022   Hospice care patient 02/01/2022   Cellulitis of perineum    Scrotal swelling    Scrotal pain    Edema    Chronic combined systolic and diastolic heart failure 01/31/2022   Cellulitis, scrotum 01/31/2022   Venous stasis ulcer of left lower leg with edema of left lower leg 01/06/2022   Lymphadenopathy of head and neck region 12/15/2021   FTT (failure to thrive) in adult 11/16/2021   Driving safety issue 29/56/2130   Decreased visual acuity 10/06/2021   Cognitive safety issue 06/27/2021   Status post right hip replacement 12/04/2020   Carotid  stenosis, asymptomatic, bilateral 08/28/2020   Statin intolerance 08/05/2020   Mass of both parotid glands 08/05/2020   Memory deficit 01/10/2020   Fall at home, initial encounter 02/04/2019   Pancytopenia 09/18/2018   Hearing  loss 08/07/2018   Abdominal aortic aneurysm (AAA) without rupture 01/02/2018   History of MRSA infection 01/02/2018   History of myocardial infarction 01/02/2018   Aortic atherosclerosis 01/01/2018   MDD (major depressive disorder), recurrent severe, without psychosis 11/17/2017   Candidal intertrigo 05/08/2017   Falls frequently 03/13/2017   Abnormal drug screen 07/09/2016   Thrombocytopenia 04/29/2016   Anemia of chronic disease 04/29/2016   Protein-calorie malnutrition 04/29/2016   RUQ abdominal pain 04/26/2016   Encounter for chronic pain management 01/04/2016   Preop cardiovascular exam 09/01/2014   Generalized weakness 07/22/2014   Esophageal dysphagia 07/22/2014   Gross hematuria 05/19/2014   Encounter for general adult medical examination with abnormal findings 04/21/2014   Advanced care planning/counseling discussion 04/21/2014   Orthostatic hypotension 03/13/2014   Cirrhosis of liver not due to alcohol 01/03/2014   Osteoarthritis of right hip 08/16/2012   Medicare annual wellness visit, subsequent 06/07/2012   Chronic dyspnea 10/06/2011   DDD (degenerative disc disease), cervical    Vitamin D deficiency 03/14/2011   Vitamin B12 deficiency 08/05/2010   Prediabetes 06/27/2010   Cervical spondylosis 05/05/2010   Obesity, morbid, BMI 40.0-49.9 05/03/2010   Hidradenitis 05/03/2010   Smoker 10/21/2009   HLD (hyperlipidemia) 09/07/2009   Gout 09/07/2009   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   Essential hypertension 09/07/2009   CAD (coronary artery disease) 09/07/2009   CARDIOMYOPATHY 09/07/2009   COPD (chronic obstructive pulmonary disease) 09/07/2009   Chronic pain syndrome 09/07/2009    Palliative Care Assessment & Plan   Patient Profile: 67 y.o. male  with past medical history of cirrhosis not alcohol related, COPD, history of coronary artery disease with MI, HTN, AAA, CHF, T2DM, and spinal stenosis presented to ED from [facility] on 09/22/22 after  experiencing a fall over his walker, hitting his face. Patient was admitted on 09/22/2022 with subdural hematoma, scrotum cellulitis, thrombocytopenia, AKI, s/p fall.   Assessment: Principal Problem:   Subdural hematoma Active Problems:   HLD (hyperlipidemia)   Obesity, morbid, BMI 40.0-49.9   Smoker   Charcot-Marie-Tooth disease   Essential hypertension   CAD (coronary artery disease)   COPD (chronic obstructive pulmonary disease)   Cervical spondylosis   Abdominal aortic aneurysm (AAA) without rupture   Cirrhosis of liver not due to alcohol   Thrombocytopenia   MDD (major depressive disorder), recurrent severe, without psychosis   Fall at home, initial encounter   Chronic combined systolic and diastolic heart failure   Cellulitis, scrotum   Portal hypertension   SDH (subdural hematoma)     Recommendations/Plan: Patient is discharging today Now DNR/DNI - durable DNR form completed and placed in shadow chart.  MOST form finalized as follows: DNR (do not intubate), comfort measures, determine use or limitation of antibiotics when infection occurs, no IV fluids, no feeding tube. Form placed in shadow chart Copy of DNR and MOST form were made and will be scanned into Vynca/ACP tab TOC notified and consulted for: outpatient Palliative Care referral PMT will continue to follow peripherally. If there are any imminent needs please call the service directly  Goals of Care and Additional Recommendations: Limitations on Scope of Treatment: Full Scope Treatment  Code Status:    Code Status Orders  (From admission, onward)  Start     Ordered   09/22/22 2047  Full code  Continuous       Question:  By:  Answer:  Consent: discussion documented in EHR   09/22/22 2049           Code Status History     Date Active Date Inactive Code Status Order ID Comments User Context   07/08/2022 1009 07/09/2022 1848 Full Code [id number]  Teddy Spike, DO Inpatient   05/04/2022 0128  05/09/2022 1915 Full Code [id number]  Angie Fava, DO ED   04/15/2022 1240 04/17/2022 2202 Full Code [id number]  Verdene Lennert, MD ED   01/31/2022 2325 02/01/2022 1835 Full Code [id number]  Jacques Navy, MD ED   03/24/2019 1544 03/26/2019 1611 Full Code [id number]  Ollen Bowl, MD ED   11/30/2015 1557 12/02/2015 1308 Full Code [id number]  Lars Mage, PA-C Inpatient       Prognosis:  Poor long term prognosis in the setting of advanced age, recurrent hospitalizations, and multiple comorbidities  Discharge Planning: Skilled Nursing Facility for rehab with Palliative care service follow-up  Care plan was discussed with primary RN,  Dr. Randol Kern, [facility], patient  Thank you for allowing the Palliative Medicine Team to assist in the care of this patient.   Total Time 70 minutes Prolonged Time Billed  yes       Greater than 50%  of this time was spent counseling and coordinating care related to the above assessment and plan.  Haskel Khan, NP  Please contact Palliative Medicine Team phone at [phone] for questions and concerns.   *Portions of this note are a verbal dictation therefore any spelling and/or grammatical errors are due to the "Dragon Medical One" system interpretation.

## 2022-09-24 NOTE — Plan of Care (Signed)

## 2022-09-24 NOTE — Progress Notes (Signed)
Called to giver report at [facility] at 1310.  Was told they would have the nurse call me back.

## 2022-09-24 NOTE — [place] FL2 (Signed)
[place] MEDICAID FL2 LEVEL OF CARE FORM     IDENTIFICATION  Patient Name: [name] Birthdate: 21-Nov-1955 Sex: male Admission Date (Current Location): 09/22/2022  [facility] and [place] Number:  Producer, television/film/video and Address:  [facility], [address], [place], [place] [address]      Provider Number: [id number]  Attending Physician Name and Address:  Elgergawy, Leana Roe, MD  Relative Name and Phone Number:       Current Level of Care: Hospital Recommended Level of Care: Skilled Nursing Facility Prior Approval Number:    Date Approved/Denied:   PASRR Number:    Discharge Plan: SNF    Current Diagnoses: Patient Active Problem List   Diagnosis Date Noted   SDH (subdural hematoma) 09/23/2022   Subdural hematoma 09/22/2022   Encephalopathy 07/08/2022   Portal hypertension 07/08/2022   Closed dislocation of right hip 07/08/2022   Falls 07/08/2022   Bilateral cellulitis of lower leg 06/07/2022   COVID-19 virus infection 05/04/2022   Hepatic encephalopathy 04/15/2022   Hospice care patient 02/01/2022   Cellulitis of perineum    Scrotal swelling    Scrotal pain    Edema    Chronic combined systolic and diastolic heart failure 01/31/2022   Cellulitis, scrotum 01/31/2022   Venous stasis ulcer of left lower leg with edema of left lower leg 01/06/2022   Lymphadenopathy of head and neck region 12/15/2021   FTT (failure to thrive) in adult 11/16/2021   Driving safety issue 06/07/7251   Decreased visual acuity 10/06/2021   Cognitive safety issue 06/27/2021   Status post right hip replacement 12/04/2020   Carotid stenosis, asymptomatic, bilateral 08/28/2020   Statin intolerance 08/05/2020   Mass of both parotid glands 08/05/2020   Memory deficit 01/10/2020   Fall at home, initial encounter 02/04/2019   Pancytopenia 09/18/2018   Hearing loss 08/07/2018   Abdominal aortic aneurysm (AAA) without rupture 01/02/2018   History of MRSA  infection 01/02/2018   History of myocardial infarction 01/02/2018   Aortic atherosclerosis 01/01/2018   MDD (major depressive disorder), recurrent severe, without psychosis 11/17/2017   Candidal intertrigo 05/08/2017   Falls frequently 03/13/2017   Abnormal drug screen 07/09/2016   Thrombocytopenia 04/29/2016   Anemia of chronic disease 04/29/2016   Protein-calorie malnutrition 04/29/2016   RUQ abdominal pain 04/26/2016   Encounter for chronic pain management 01/04/2016   Preop cardiovascular exam 09/01/2014   Generalized weakness 07/22/2014   Esophageal dysphagia 07/22/2014   Gross hematuria 05/19/2014   Encounter for general adult medical examination with abnormal findings 04/21/2014   Advanced care planning/counseling discussion 04/21/2014   Orthostatic hypotension 03/13/2014   Cirrhosis of liver not due to alcohol 01/03/2014   Osteoarthritis of right hip 08/16/2012   Medicare annual wellness visit, subsequent 06/07/2012   Chronic dyspnea 10/06/2011   DDD (degenerative disc disease), cervical    Vitamin D deficiency 03/14/2011   Vitamin B12 deficiency 08/05/2010   Prediabetes 06/27/2010   Cervical spondylosis 05/05/2010   Obesity, morbid, BMI 40.0-49.9 05/03/2010   Hidradenitis 05/03/2010   Smoker 10/21/2009   HLD (hyperlipidemia) 09/07/2009   Gout 09/07/2009   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   Essential hypertension 09/07/2009   CAD (coronary artery disease) 09/07/2009   CARDIOMYOPATHY 09/07/2009   COPD (chronic obstructive pulmonary disease) 09/07/2009   Chronic pain syndrome 09/07/2009    Orientation RESPIRATION BLADDER Height & Weight     Self, Time, Situation, Place  Normal Incontinent Weight: 290 lb 2 oz (131.6 kg) Height:  5\' 9"  (175.3 cm)  BEHAVIORAL SYMPTOMS/MOOD NEUROLOGICAL BOWEL NUTRITION STATUS      Continent Diet (heart healthy/carb modified)  AMBULATORY STATUS COMMUNICATION OF NEEDS Skin   Extensive Assist Verbally Normal                        Personal Care Assistance Level of Assistance  Bathing, Feeding, Dressing Bathing Assistance: Maximum assistance Feeding assistance: Limited assistance Dressing Assistance: Maximum assistance     Functional Limitations Info             SPECIAL CARE FACTORS FREQUENCY  PT (By licensed PT), OT (By licensed OT)     PT Frequency: 5x/wk OT Frequency: 5x/wk            Contractures Contractures Info: Not present    Additional Factors Info  Code Status, Allergies, Insulin Sliding Scale Code Status Info: Full Allergies Info: Statins, Losartan, Wellbutrin (Bupropion), Allopurinol, Baclofen, Penicillins, Tramadol   Insulin Sliding Scale Info: see DC summary       Current Medications (09/24/2022):  This is the current hospital active medication list Current Facility-Administered Medications  Medication Dose Route Frequency Provider Last Rate Last Admin   0.9 %  sodium chloride infusion  250 mL Intravenous PRN Reva Bores, MD       albuterol (PROVENTIL) (2.5 MG/3ML) 0.083% nebulizer solution 2.5 mg  2.5 mg Nebulization Q6H PRN Reva Bores, MD       cefTRIAXone (ROCEPHIN) 2 g in sodium chloride 0.9 % 100 mL IVPB  2 g Intravenous Q24H Glade Lloyd, MD   Stopped at 09/23/22 1303   cyanocobalamin (VITAMIN B12) tablet 500 mcg  500 mcg Oral Q M,W,F Reva Bores, MD   500 mcg at 09/22/22 2211   fentaNYL (DURAGESIC) 75 MCG/HR 1 patch  1 patch Transdermal Q72H Reva Bores, MD       ferrous sulfate tablet 325 mg  325 mg Oral Jodi Geralds, MD   325 mg at 09/23/22 0950   folic acid (FOLVITE) tablet 1 mg  1 mg Oral Daily Reva Bores, MD   1 mg at 09/23/22 0950   furosemide (LASIX) tablet 80 mg  80 mg Oral Daily Reva Bores, MD   80 mg at 09/23/22 0950   gabapentin (NEURONTIN) capsule 300 mg  300 mg Oral BID Reva Bores, MD   300 mg at 09/23/22 2129   Gerhardt's butt cream   Topical TID Glade Lloyd, MD   Given at 09/23/22 2129   HYDROmorphone (DILAUDID)  injection 0.5 mg  0.5 mg Intravenous Q4H PRN Dow Adolph N, DO   0.5 mg at 09/24/22 0048   insulin aspart (novoLOG) injection 0-15 Units  0-15 Units Subcutaneous TID WC Reva Bores, MD   2 Units at 09/23/22 1125   lactulose (CHRONULAC) 10 GM/15ML solution 20 g  20 g Oral QID Reva Bores, MD   20 g at 09/23/22 2129   metoprolol tartrate (LOPRESSOR) injection 5 mg  5 mg Intravenous Q6H PRN Reva Bores, MD       nicotine (NICODERM CQ - dosed in mg/24 hours) patch 14 mg  14 mg Transdermal Daily Reva Bores, MD   14 mg at 09/23/22 0950   nitroGLYCERIN (NITROSTAT) SL tablet 0.4 mg  0.4 mg Sublingual Q5 min PRN Reva Bores, MD       ondansetron [facility]) tablet 4 mg  4 mg Oral Q8H PRN Reva Bores,  MD       oxyCODONE (Oxy IR/ROXICODONE) immediate release tablet 5 mg  5 mg Oral Q4H PRN Hall, Carole N, DO       pantoprazole (PROTONIX) EC tablet 40 mg  40 mg Oral Daily Reva Bores, MD   40 mg at 09/23/22 0950   polyethylene glycol (MIRALAX / GLYCOLAX) packet 17 g  17 g Oral Daily PRN Reva Bores, MD       potassium chloride SA (KLOR-CON M) CR tablet 40 mEq  40 mEq Oral Q4H Elgergawy, Leana Roe, MD       rifaximin [facility]) tablet 550 mg  550 mg Oral BID Reva Bores, MD   550 mg at 09/23/22 2129   senna-docusate (Senokot-S) tablet 2 tablet  2 tablet Oral QHS Dow Adolph N, DO       sodium chloride flush (NS) 0.9 % injection 3 mL  3 mL Intravenous Q12H Reva Bores, MD   3 mL at 09/22/22 2213   sodium chloride flush (NS) 0.9 % injection 3 mL  3 mL Intravenous PRN Reva Bores, MD       spironolactone (ALDACTONE) tablet 50 mg  50 mg Oral Daily Reva Bores, MD   50 mg at 09/23/22 0950   thiamine (VITAMIN B1) tablet 100 mg  100 mg Oral Daily Reva Bores, MD   100 mg at 09/23/22 0950   umeclidinium bromide (INCRUSE ELLIPTA) 62.5 MCG/ACT 1 puff  1 puff Inhalation Daily Reva Bores, MD       vancomycin (VANCOREADY) IVPB 1500 mg/300 mL  1,500 mg Intravenous Q24H Elgergawy,  Leana Roe, MD         Discharge Medications: Please see discharge summary for a list of discharge medications.  Relevant Imaging Results:  Relevant Lab Results:   Additional Information SS#: [id number]  Baldemar Lenis, LCSW

## 2022-09-24 NOTE — Progress Notes (Signed)
Pharmacy Antibiotic Note  [name] is a 67 y.o. male admitted on 09/22/2022 presenting from SNF with fall, open wounds + cellulitis on the scrotum.  Pharmacy has been consulted for vancomycin dosing.  Ceftriaxone per MD.   Patient's Scr has improved significantly from 1.7 to 1.2 this AM. Will increase maintenance dose of vancomycin. He remains afebrile, WBC remains normal at 5.8k.   Plan: Increase Vancomycin to  IV q24h (eAUC 422; Scr 1.2, IBW, Vd 0.5) Monitor renal function, Cx and clinical progression to narrow Vancomycin levels as indicated    Height:  (175.3 cm) Weight: 131.6 kg (290 lb 2 oz) IBW/kg (Calculated) : 70.7  Temp (24hrs), Avg:98.1 F (36.7 C), Min:97.9 F (36.6 C), Max:98.3 F (36.8 C)  Recent Labs  Lab 09/22/22 1723 09/22/22 1752 09/24/22 0350 09/24/22 0534  WBC 6.8  --   --  5.8  CREATININE 1.60* 1.70* 1.20  --   LATICACIDVEN 2.6*  --   --   --      Estimated Creatinine Clearance: 80.4 mL/min (by C-G formula based on SCr of 1.2 mg/dL).    Allergies  Allergen Reactions   Statins Shortness Of Breath    Cough, trouble breathing Cough, trouble breathing   Losartan Other (See Comments)    Causes him to have pain   Wellbutrin [Bupropion] Other (See Comments)    Worsened mood - crying   Allopurinol Nausea Only   Baclofen Nausea And Vomiting   Penicillins Nausea And Vomiting    Did it involve swelling of the face/tongue/throat, SOB, or low BP? N/A Did it involve sudden or severe rash/hives, skin peeling, or any reaction on the inside of your mouth or nose? N/A Did you need to seek medical attention at a hospital or doctor's office? N/A When did it last happen? Child     If all above answers are "NO", may proceed with cephalosporin use.   Tramadol Nausea Only    Blane Ohara, PharmD  PGY1 Pharmacy Resident

## 2022-09-24 NOTE — Progress Notes (Signed)
No new issues or problems.  Patient with mild headache.  No numbness paresthesias or weakness.  Status post significant traumatic brain injury with small interhemispheric subdural hematoma.  No need for follow-up imaging or any type of neurosurgical interventions.  Hold Plavix for 2 weeks post injury.

## 2022-09-25 DIAGNOSIS — I10 Essential (primary) hypertension: Secondary | ICD-10-CM | POA: Diagnosis not present

## 2022-09-25 DIAGNOSIS — G894 Chronic pain syndrome: Secondary | ICD-10-CM | POA: Diagnosis not present

## 2022-09-25 DIAGNOSIS — S065X0D Traumatic subdural hemorrhage without loss of consciousness, subsequent encounter: Secondary | ICD-10-CM | POA: Diagnosis not present

## 2022-09-25 DIAGNOSIS — J449 Chronic obstructive pulmonary disease, unspecified: Secondary | ICD-10-CM | POA: Diagnosis not present

## 2022-09-25 DIAGNOSIS — I5042 Chronic combined systolic (congestive) and diastolic (congestive) heart failure: Secondary | ICD-10-CM | POA: Diagnosis not present

## 2022-09-25 DIAGNOSIS — K7460 Unspecified cirrhosis of liver: Secondary | ICD-10-CM | POA: Diagnosis not present

## 2022-09-25 DIAGNOSIS — R627 Adult failure to thrive: Secondary | ICD-10-CM | POA: Diagnosis not present

## 2022-09-25 DIAGNOSIS — N492 Inflammatory disorders of scrotum: Secondary | ICD-10-CM | POA: Diagnosis not present

## 2022-09-26 ENCOUNTER — Encounter (HOSPITAL_COMMUNITY): Payer: Self-pay | Admitting: Emergency Medicine

## 2022-09-26 ENCOUNTER — Inpatient Hospital Stay (HOSPITAL_COMMUNITY)
Admission: EM | Admit: 2022-09-26 | Discharge: 2022-10-03 | DRG: 292 | Disposition: A | Discharge status: Skilled Nursing Facility | Payer: Medicare HMO | Source: Skilled Nursing Facility | Attending: Internal Medicine | Admitting: Internal Medicine

## 2022-09-26 ENCOUNTER — Emergency Department (HOSPITAL_COMMUNITY): Payer: Medicare HMO

## 2022-09-26 DIAGNOSIS — S065XAD Traumatic subdural hemorrhage with loss of consciousness status unknown, subsequent encounter: Secondary | ICD-10-CM

## 2022-09-26 DIAGNOSIS — N492 Inflammatory disorders of scrotum: Secondary | ICD-10-CM | POA: Diagnosis present

## 2022-09-26 DIAGNOSIS — Z515 Encounter for palliative care: Secondary | ICD-10-CM | POA: Diagnosis not present

## 2022-09-26 DIAGNOSIS — K219 Gastro-esophageal reflux disease without esophagitis: Secondary | ICD-10-CM | POA: Diagnosis present

## 2022-09-26 DIAGNOSIS — Z833 Family history of diabetes mellitus: Secondary | ICD-10-CM

## 2022-09-26 DIAGNOSIS — R0609 Other forms of dyspnea: Secondary | ICD-10-CM | POA: Diagnosis not present

## 2022-09-26 DIAGNOSIS — N1831 Chronic kidney disease, stage 3a: Secondary | ICD-10-CM | POA: Diagnosis present

## 2022-09-26 DIAGNOSIS — Z981 Arthrodesis status: Secondary | ICD-10-CM

## 2022-09-26 DIAGNOSIS — J9811 Atelectasis: Secondary | ICD-10-CM | POA: Diagnosis not present

## 2022-09-26 DIAGNOSIS — G894 Chronic pain syndrome: Secondary | ICD-10-CM | POA: Diagnosis not present

## 2022-09-26 DIAGNOSIS — D638 Anemia in other chronic diseases classified elsewhere: Secondary | ICD-10-CM | POA: Diagnosis not present

## 2022-09-26 DIAGNOSIS — E785 Hyperlipidemia, unspecified: Secondary | ICD-10-CM | POA: Diagnosis present

## 2022-09-26 DIAGNOSIS — D7589 Other specified diseases of blood and blood-forming organs: Secondary | ICD-10-CM | POA: Diagnosis present

## 2022-09-26 DIAGNOSIS — I255 Ischemic cardiomyopathy: Secondary | ICD-10-CM | POA: Diagnosis present

## 2022-09-26 DIAGNOSIS — I6200 Nontraumatic subdural hemorrhage, unspecified: Secondary | ICD-10-CM | POA: Diagnosis not present

## 2022-09-26 DIAGNOSIS — D539 Nutritional anemia, unspecified: Secondary | ICD-10-CM | POA: Diagnosis present

## 2022-09-26 DIAGNOSIS — E7220 Disorder of urea cycle metabolism, unspecified: Secondary | ICD-10-CM

## 2022-09-26 DIAGNOSIS — I959 Hypotension, unspecified: Secondary | ICD-10-CM | POA: Diagnosis present

## 2022-09-26 DIAGNOSIS — J449 Chronic obstructive pulmonary disease, unspecified: Secondary | ICD-10-CM | POA: Diagnosis present

## 2022-09-26 DIAGNOSIS — R0600 Dyspnea, unspecified: Secondary | ICD-10-CM | POA: Diagnosis not present

## 2022-09-26 DIAGNOSIS — F17210 Nicotine dependence, cigarettes, uncomplicated: Secondary | ICD-10-CM | POA: Diagnosis present

## 2022-09-26 DIAGNOSIS — Z8661 Personal history of infections of the central nervous system: Secondary | ICD-10-CM

## 2022-09-26 DIAGNOSIS — G4489 Other headache syndrome: Secondary | ICD-10-CM | POA: Diagnosis not present

## 2022-09-26 DIAGNOSIS — I2510 Atherosclerotic heart disease of native coronary artery without angina pectoris: Secondary | ICD-10-CM | POA: Diagnosis present

## 2022-09-26 DIAGNOSIS — Z888 Allergy status to other drugs, medicaments and biological substances status: Secondary | ICD-10-CM

## 2022-09-26 DIAGNOSIS — E871 Hypo-osmolality and hyponatremia: Secondary | ICD-10-CM | POA: Diagnosis not present

## 2022-09-26 DIAGNOSIS — J309 Allergic rhinitis, unspecified: Secondary | ICD-10-CM | POA: Diagnosis present

## 2022-09-26 DIAGNOSIS — Z7401 Bed confinement status: Secondary | ICD-10-CM | POA: Diagnosis not present

## 2022-09-26 DIAGNOSIS — Z6841 Body Mass Index (BMI) 40.0 and over, adult: Secondary | ICD-10-CM

## 2022-09-26 DIAGNOSIS — I872 Venous insufficiency (chronic) (peripheral): Secondary | ICD-10-CM | POA: Diagnosis present

## 2022-09-26 DIAGNOSIS — K7581 Nonalcoholic steatohepatitis (NASH): Secondary | ICD-10-CM | POA: Diagnosis present

## 2022-09-26 DIAGNOSIS — I130 Hypertensive heart and chronic kidney disease with heart failure and stage 1 through stage 4 chronic kidney disease, or unspecified chronic kidney disease: Secondary | ICD-10-CM | POA: Diagnosis not present

## 2022-09-26 DIAGNOSIS — E1122 Type 2 diabetes mellitus with diabetic chronic kidney disease: Secondary | ICD-10-CM | POA: Diagnosis present

## 2022-09-26 DIAGNOSIS — I714 Abdominal aortic aneurysm, without rupture, unspecified: Secondary | ICD-10-CM | POA: Diagnosis present

## 2022-09-26 DIAGNOSIS — I5021 Acute systolic (congestive) heart failure: Secondary | ICD-10-CM | POA: Diagnosis not present

## 2022-09-26 DIAGNOSIS — R0602 Shortness of breath: Secondary | ICD-10-CM | POA: Diagnosis not present

## 2022-09-26 DIAGNOSIS — I8500 Esophageal varices without bleeding: Secondary | ICD-10-CM | POA: Diagnosis present

## 2022-09-26 DIAGNOSIS — I252 Old myocardial infarction: Secondary | ICD-10-CM

## 2022-09-26 DIAGNOSIS — E860 Dehydration: Secondary | ICD-10-CM | POA: Diagnosis not present

## 2022-09-26 DIAGNOSIS — K7460 Unspecified cirrhosis of liver: Secondary | ICD-10-CM | POA: Diagnosis present

## 2022-09-26 DIAGNOSIS — R161 Splenomegaly, not elsewhere classified: Secondary | ICD-10-CM | POA: Diagnosis present

## 2022-09-26 DIAGNOSIS — W19XXXD Unspecified fall, subsequent encounter: Secondary | ICD-10-CM | POA: Diagnosis present

## 2022-09-26 DIAGNOSIS — Z66 Do not resuscitate: Secondary | ICD-10-CM | POA: Diagnosis present

## 2022-09-26 DIAGNOSIS — S065X0D Traumatic subdural hemorrhage without loss of consciousness, subsequent encounter: Secondary | ICD-10-CM | POA: Diagnosis not present

## 2022-09-26 DIAGNOSIS — E8770 Fluid overload, unspecified: Secondary | ICD-10-CM | POA: Diagnosis present

## 2022-09-26 DIAGNOSIS — Z955 Presence of coronary angioplasty implant and graft: Secondary | ICD-10-CM

## 2022-09-26 DIAGNOSIS — R627 Adult failure to thrive: Secondary | ICD-10-CM | POA: Diagnosis not present

## 2022-09-26 DIAGNOSIS — Z880 Allergy status to penicillin: Secondary | ICD-10-CM

## 2022-09-26 DIAGNOSIS — I10 Essential (primary) hypertension: Secondary | ICD-10-CM | POA: Diagnosis not present

## 2022-09-26 DIAGNOSIS — J811 Chronic pulmonary edema: Secondary | ICD-10-CM | POA: Diagnosis not present

## 2022-09-26 DIAGNOSIS — I5042 Chronic combined systolic (congestive) and diastolic (congestive) heart failure: Secondary | ICD-10-CM | POA: Diagnosis present

## 2022-09-26 DIAGNOSIS — K766 Portal hypertension: Secondary | ICD-10-CM | POA: Diagnosis present

## 2022-09-26 DIAGNOSIS — S065XAA Traumatic subdural hemorrhage with loss of consciousness status unknown, initial encounter: Secondary | ICD-10-CM | POA: Diagnosis present

## 2022-09-26 DIAGNOSIS — Z79899 Other long term (current) drug therapy: Secondary | ICD-10-CM

## 2022-09-26 DIAGNOSIS — Z8249 Family history of ischemic heart disease and other diseases of the circulatory system: Secondary | ICD-10-CM

## 2022-09-26 LAB — BRAIN NATRIURETIC PEPTIDE: B Natriuretic Peptide: 61.3 pg/mL (ref 0.0–100.0)

## 2022-09-26 MED ORDER — IPRATROPIUM-ALBUTEROL 0.5-2.5 (3) MG/3ML IN SOLN
3.0000 mL | Freq: Once | RESPIRATORY_TRACT | Status: AC
  Administered 2022-09-27: 3 mL via RESPIRATORY_TRACT
  Filled 2022-09-26: qty 3

## 2022-09-26 NOTE — ED Triage Notes (Addendum)
SOB upon exertion. Recent brain bleed 5 days ago. Coming from [facility]. This has been going on for 3 days. Sats WNL. States can't catch good breath. COPD and CHF history. Stomach cramping and headache. Denies headache being worse from fall.

## 2022-09-26 NOTE — ED Provider Notes (Signed)
[facility] DEPARTMENT AT [facility] Provider Note   CSN: [id number] Arrival date & time: 09/26/22  2046     History {Add pertinent medical, surgical, social history, OB history to HPI:1} Chief Complaint  Patient presents with   Shortness of Breath    [name] is a 67 y.o. male.  Patient is not a great historian.  Appears in the records today's a history of CHF, COPD, cirrhosis.  He states has been more short of breath with worsening leg swelling.  Denies fever.  States has been coughing but nothing coming up.  It appears he was just discharged from our hospital 2 days ago however the facility states that he has been short of breath 3 days with this.  Has a DNR and MOST form for comfort care at bedside.   Shortness of Breath      Home Medications Prior to Admission medications   Medication Sig Start Date End Date Taking? Authorizing Provider  albuterol (PROVENTIL) (2.5 MG/3ML) 0.083% nebulizer solution USE 1 VIAL PER NEBULIZER EVERY 6 HRS AS NEEDED FOR WHEEZING Patient taking differently: Take 2.5 mg by nebulization every 6 (six) hours as needed for wheezing or shortness of breath. 12/03/20   [name], MD  albuterol (VENTOLIN HFA) 108 (90 Base) MCG/ACT inhaler TAKE 2 PUFFS BY MOUTH EVERY 6 HOURS AS NEEDED FOR WHEEZE OR SHORTNESS OF BREATH Patient taking differently: Inhale 2 puffs into the lungs every 6 (six) hours as needed for wheezing or shortness of breath. 12/14/21   [name], MD  carvedilol (COREG) 3.125 MG tablet Take 3.125 mg by mouth 2 (two) times daily.    [provider]  doxycycline (VIBRA-TABS) 100 MG tablet Take 1 tablet (100 mg total) by mouth 2 (two) times daily for 4 days. 09/24/22 09/28/22  Elgergawy, Leana Roe, MD  fentaNYL (DURAGESIC) 75 MCG/HR Place 1 patch onto the skin every 3 (three) days. 09/24/22   Elgergawy, Leana Roe, MD  ferrous sulfate 325 (65 FE) MG tablet Take 1 tablet (325 mg total) by mouth every  other day. 04/29/19   [name], MD  folic acid (FOLVITE) 1 MG tablet TAKE 1 TABLET BY MOUTH EVERY DAY Patient not taking: Reported on 09/23/2022 11/07/21   [name], MD  gabapentin (NEURONTIN) 300 MG capsule Take 1 capsule (300 mg total) by mouth in the morning and at bedtime. 06/27/21   [name], MD  lactulose (CHRONULAC) 10 GM/15ML solution Take 30 mLs by mouth 4 (four) times daily.    [provider]  Lactulose 20 GM/30ML SOLN Take 45 mLs by mouth in the morning, at noon, and at bedtime.    [provider]  metolazone (ZAROXOLYN) 2.5 MG tablet Take 2.5 mg by mouth. Mondays and thursdays 09/21/22   [provider]  nicotine (NICODERM CQ - DOSED IN MG/24 HOURS) 14 mg/24hr patch APPLY 1 PATCH ONTO THE SKIN EVERY DAY Patient taking differently: Place 14 mg onto the skin daily. 02/09/22   [name], MD  nitroGLYCERIN (NITROSTAT) 0.4 MG SL tablet Place 1 tablet (0.4 mg total) under the tongue every 5 (five) minutes as needed for chest pain. 10/10/12   Lewayne Bunting, MD  [facility] powder Apply 1 Application topically 3 (three) times daily. 09/21/22   [provider]  ondansetron (ZOFRAN) 4 MG tablet TAKE 1 TABLET BY MOUTH EVERY 8 HOURS AS NEEDED FOR NAUSEA AND VOMITING Patient not taking: Reported on 09/23/2022 01/09/22   Wyline Mood, MD  oxyCODONE (  OXY IR/ROXICODONE) 5 MG immediate release tablet Take 1 tablet (5 mg total) by mouth every 8 (eight) hours as needed for severe pain. 09/24/22   Elgergawy, Leana Roe, MD  pantoprazole (PROTONIX) 40 MG tablet Take 40 mg by mouth daily.    [provider]  spironolactone (ALDACTONE) 100 MG tablet Take 0.5 tablets (50 mg total) by mouth daily. Patient taking differently: Take 100 mg by mouth daily. 05/09/22   Elgergawy, Leana Roe, MD  TART CHERRY PO Take 1 tablet by mouth in the morning and at bedtime.    [provider]  Tiotropium Bromide Monohydrate (SPIRIVA RESPIMAT) 2.5 MCG/ACT AERS  Inhale 2 puffs into the lungs daily. 10/04/21   [name], MD  torsemide (DEMADEX) 20 MG tablet Take 20 mg by mouth daily. 08/18/22   [provider]  vitamin B-12 (CYANOCOBALAMIN) 500 MCG tablet Take 1 tablet (500 mcg total) by mouth every Monday, Wednesday, and Friday. 12/27/18   [name], MD  XIFAXAN 550 MG TABS tablet Take 550 mg by mouth 2 (two) times daily. 06/24/22   [provider]      Allergies    Statins, Losartan, Wellbutrin [bupropion], Allopurinol, Baclofen, Penicillins, and Tramadol    Review of Systems   Review of Systems  Respiratory:  Positive for shortness of breath.     Physical Exam Updated Vital Signs BP (!) 132/98 (BP Location: Right Arm)   Pulse 76   Resp 16   SpO2 96%  Physical Exam Vitals and nursing note reviewed.  Constitutional:      Appearance: He is well-developed.  HENT:     Head: Normocephalic and atraumatic.  Cardiovascular:     Rate and Rhythm: Normal rate.  Pulmonary:     Effort: Pulmonary effort is normal. Tachypnea present. No respiratory distress.     Breath sounds: Decreased breath sounds present.  Abdominal:     General: There is no distension.  Musculoskeletal:        General: Normal range of motion.     Cervical back: Normal range of motion.     Right lower leg: Edema (with erythema bilaterally) present.     Left lower leg: Edema present.  Neurological:     Mental Status: He is alert.     ED Results / Procedures / Treatments   Labs (all labs ordered are listed, but only abnormal results are displayed) Labs Reviewed  CBC WITH DIFFERENTIAL/PLATELET  BRAIN NATRIURETIC PEPTIDE  COMPREHENSIVE METABOLIC PANEL  LIPASE, BLOOD  AMMONIA  BLOOD GAS, VENOUS  PROTIME-INR  CBC WITH DIFFERENTIAL/PLATELET    EKG None  Radiology DG Chest Port 1 View  Result Date: 09/26/2022 CLINICAL DATA:  Shortness of breath upon exertion. EXAM: PORTABLE CHEST 1 VIEW COMPARISON:  09/22/2022. FINDINGS: The heart is  enlarged and the mediastinal contour is stable. Rightward shift of the heart and mediastinal structures is unchanged. The pulmonary vasculature is distended. No consolidation, effusion, or pneumothorax. A single lead pacemaker device is present over the left chest. Cervical spinal fusion hardware and abdominal aortic endograft are noted. No acute osseous abnormality. IMPRESSION: Cardiomegaly with pulmonary vascular congestion. Electronically Signed   By: Thornell Sartorius M.D.   On: 09/26/2022 22:34    Procedures Procedures  {Document cardiac monitor, telemetry assessment procedure when appropriate:1}  Medications Ordered in ED Medications  ipratropium-albuterol (DUONEB) 0.5-2.5 (3) MG/3ML nebulizer solution 3 mL (has no administration in time range)    ED Course/ Medical Decision Making/ A&P   {   Click  here for ABCD2, HEART and other calculatorsREFRESH Note before signing :1}                          Medical Decision Making Risk Prescription drug management.   No obvious pulmonary edema on his x-ray, pending labs.  Will try breathing treatments as he does have a history of COPD.  The rest of his labs.  This time I see no reason to be admitted to the hospital.  We will see what comes up on his test results prior to disposition.  {Document critical care time when appropriate:1} {Document review of labs and clinical decision tools ie heart score, Chads2Vasc2 etc:1}  {Document your independent review of radiology images, and any outside records:1} {Document your discussion with family members, caretakers, and with consultants:1} {Document social determinants of health affecting pt's care:1} {Document your decision making why or why not admission, treatments were needed:1} Final Clinical Impression(s) / ED Diagnoses Final diagnoses:  None    Rx / DC Orders ED Discharge Orders     None

## 2022-09-27 ENCOUNTER — Observation Stay (HOSPITAL_COMMUNITY): Payer: Medicare HMO

## 2022-09-27 ENCOUNTER — Emergency Department (HOSPITAL_COMMUNITY): Payer: Medicare HMO

## 2022-09-27 DIAGNOSIS — R0609 Other forms of dyspnea: Secondary | ICD-10-CM | POA: Diagnosis not present

## 2022-09-27 DIAGNOSIS — E871 Hypo-osmolality and hyponatremia: Secondary | ICD-10-CM | POA: Diagnosis present

## 2022-09-27 DIAGNOSIS — I6200 Nontraumatic subdural hemorrhage, unspecified: Secondary | ICD-10-CM | POA: Diagnosis not present

## 2022-09-27 DIAGNOSIS — R0602 Shortness of breath: Secondary | ICD-10-CM | POA: Diagnosis not present

## 2022-09-27 DIAGNOSIS — J9811 Atelectasis: Secondary | ICD-10-CM | POA: Diagnosis not present

## 2022-09-27 DIAGNOSIS — E8770 Fluid overload, unspecified: Secondary | ICD-10-CM | POA: Diagnosis present

## 2022-09-27 DIAGNOSIS — N1831 Chronic kidney disease, stage 3a: Secondary | ICD-10-CM | POA: Diagnosis present

## 2022-09-27 LAB — CBC WITH DIFFERENTIAL/PLATELET
Abs Immature Granulocytes: 0.11 10*3/uL — ABNORMAL HIGH (ref 0.00–0.07)
Basophils Absolute: 0 10*3/uL (ref 0.0–0.1)
Basophils Relative: 0 %
Eosinophils Absolute: 0.1 10*3/uL (ref 0.0–0.5)
Eosinophils Relative: 1 %
HCT: 33.5 % — ABNORMAL LOW (ref 39.0–52.0)
Hemoglobin: 11.6 g/dL — ABNORMAL LOW (ref 13.0–17.0)
Immature Granulocytes: 1 %
Lymphocytes Relative: 7 %
Lymphs Abs: 0.7 10*3/uL (ref 0.7–4.0)
MCH: 35.5 pg — ABNORMAL HIGH (ref 26.0–34.0)
MCHC: 34.6 g/dL (ref 30.0–36.0)
MCV: 102.4 fL — ABNORMAL HIGH (ref 80.0–100.0)
Monocytes Absolute: 0.9 10*3/uL (ref 0.1–1.0)
Monocytes Relative: 10 %
Neutro Abs: 7.5 10*3/uL (ref 1.7–7.7)
Neutrophils Relative %: 81 %
Platelets: 42 10*3/uL — ABNORMAL LOW (ref 150–400)
RBC: 3.27 MIL/uL — ABNORMAL LOW (ref 4.22–5.81)
RDW: 16.2 % — ABNORMAL HIGH (ref 11.5–15.5)
WBC: 9.3 10*3/uL (ref 4.0–10.5)
nRBC: 0 % (ref 0.0–0.2)

## 2022-09-27 LAB — LIPASE, BLOOD: Lipase: 37 U/L (ref 11–51)

## 2022-09-27 LAB — AMMONIA: Ammonia: 70 umol/L — ABNORMAL HIGH (ref 9–35)

## 2022-09-27 LAB — COMPREHENSIVE METABOLIC PANEL
ALT: 33 U/L (ref 0–44)
AST: 55 U/L — ABNORMAL HIGH (ref 15–41)
Albumin: 2.2 g/dL — ABNORMAL LOW (ref 3.5–5.0)
Alkaline Phosphatase: 191 U/L — ABNORMAL HIGH (ref 38–126)
Anion gap: 9 (ref 5–15)
BUN: 34 mg/dL — ABNORMAL HIGH (ref 8–23)
CO2: 26 mmol/L (ref 22–32)
Calcium: 8.1 mg/dL — ABNORMAL LOW (ref 8.9–10.3)
Chloride: 90 mmol/L — ABNORMAL LOW (ref 98–111)
Creatinine, Ser: 1.45 mg/dL — ABNORMAL HIGH (ref 0.61–1.24)
GFR, Estimated: 53 mL/min — ABNORMAL LOW (ref >60)
Glucose, Bld: 133 mg/dL — ABNORMAL HIGH (ref 70–99)
Potassium: 3.2 mmol/L — ABNORMAL LOW (ref 3.5–5.1)
Sodium: 125 mmol/L — ABNORMAL LOW (ref 135–145)
Total Bilirubin: 3.1 mg/dL — ABNORMAL HIGH (ref 0.3–1.2)
Total Protein: 8.9 g/dL — ABNORMAL HIGH (ref 6.5–8.1)

## 2022-09-27 MED ORDER — GABAPENTIN 300 MG PO CAPS
300.0000 mg | ORAL_CAPSULE | Freq: Two times a day (BID) | ORAL | Status: DC
  Administered 2022-09-27 – 2022-09-30 (×7): 300 mg via ORAL
  Filled 2022-09-27 (×7): qty 1

## 2022-09-27 MED ORDER — HYDRALAZINE HCL 20 MG/ML IJ SOLN: 5.0000 mg | INTRAMUSCULAR | Status: DC | PRN

## 2022-09-27 MED ORDER — PANTOPRAZOLE SODIUM 40 MG PO TBEC
40.0000 mg | DELAYED_RELEASE_TABLET | Freq: Every day | ORAL | Status: DC
  Administered 2022-09-27 – 2022-10-03 (×7): 40 mg via ORAL
  Filled 2022-09-27 (×7): qty 1

## 2022-09-27 MED ORDER — ONDANSETRON HCL 4 MG PO TABS: 4.0000 mg | ORAL_TABLET | Freq: Four times a day (QID) | ORAL | Status: DC | PRN

## 2022-09-27 MED ORDER — DOXYCYCLINE HYCLATE 100 MG PO TABS
100.0000 mg | ORAL_TABLET | Freq: Two times a day (BID) | ORAL | Status: DC
  Administered 2022-09-27 (×2): 100 mg via ORAL
  Filled 2022-09-27 (×2): qty 1

## 2022-09-27 MED ORDER — OXYCODONE HCL 5 MG PO TABS
5.0000 mg | ORAL_TABLET | ORAL | Status: DC | PRN
  Administered 2022-09-27 (×2): 5 mg via ORAL
  Filled 2022-09-27 (×2): qty 1

## 2022-09-27 MED ORDER — ACETAMINOPHEN 650 MG RE SUPP: 650.0000 mg | Freq: Four times a day (QID) | RECTAL | Status: DC | PRN

## 2022-09-27 MED ORDER — BISACODYL 5 MG PO TBEC: 5.0000 mg | DELAYED_RELEASE_TABLET | Freq: Every day | ORAL | Status: DC | PRN

## 2022-09-27 MED ORDER — ACETAMINOPHEN 325 MG PO TABS
650.0000 mg | ORAL_TABLET | Freq: Four times a day (QID) | ORAL | Status: DC | PRN
  Administered 2022-09-27: 650 mg via ORAL
  Filled 2022-09-27 (×2): qty 2

## 2022-09-27 MED ORDER — POLYETHYLENE GLYCOL 3350 17 G PO PACK: 17.0000 g | PACK | Freq: Every day | ORAL | Status: DC | PRN

## 2022-09-27 MED ORDER — SODIUM CHLORIDE 0.9% FLUSH
3.0000 mL | Freq: Two times a day (BID) | INTRAVENOUS | Status: DC
  Administered 2022-09-27 – 2022-10-03 (×12): 3 mL via INTRAVENOUS

## 2022-09-27 MED ORDER — RIFAXIMIN 550 MG PO TABS
550.0000 mg | ORAL_TABLET | Freq: Two times a day (BID) | ORAL | Status: DC
  Administered 2022-09-27 – 2022-10-03 (×13): 550 mg via ORAL
  Filled 2022-09-27 (×13): qty 1

## 2022-09-27 MED ORDER — LACTULOSE 10 GM/15ML PO SOLN
20.0000 g | Freq: Four times a day (QID) | ORAL | Status: DC
  Administered 2022-09-27 – 2022-09-28 (×5): 20 g via ORAL
  Filled 2022-09-27 (×6): qty 30

## 2022-09-27 MED ORDER — ALBUTEROL SULFATE (2.5 MG/3ML) 0.083% IN NEBU
2.5000 mg | INHALATION_SOLUTION | Freq: Four times a day (QID) | RESPIRATORY_TRACT | Status: DC | PRN
  Administered 2022-10-02: 2.5 mg via RESPIRATORY_TRACT
  Filled 2022-09-27: qty 3

## 2022-09-27 MED ORDER — IOHEXOL 350 MG/ML SOLN
75.0000 mL | Freq: Once | INTRAVENOUS | Status: AC | PRN
  Administered 2022-09-27: 75 mL via INTRAVENOUS

## 2022-09-27 MED ORDER — OXYCODONE HCL 5 MG PO TABS
5.0000 mg | ORAL_TABLET | Freq: Three times a day (TID) | ORAL | Status: DC | PRN
  Administered 2022-09-28 – 2022-09-29 (×5): 5 mg via ORAL
  Filled 2022-09-27 (×5): qty 1

## 2022-09-27 MED ORDER — SODIUM CHLORIDE 0.9 % IV BOLUS
500.0000 mL | Freq: Once | INTRAVENOUS | Status: AC
  Administered 2022-09-27: 500 mL via INTRAVENOUS

## 2022-09-27 MED ORDER — DOCUSATE SODIUM 100 MG PO CAPS
100.0000 mg | ORAL_CAPSULE | Freq: Two times a day (BID) | ORAL | Status: DC
  Administered 2022-09-27 – 2022-10-03 (×12): 100 mg via ORAL
  Filled 2022-09-27 (×11): qty 1

## 2022-09-27 MED ORDER — LACTATED RINGERS IV SOLN: INTRAVENOUS | Status: DC

## 2022-09-27 MED ORDER — FENTANYL 75 MCG/HR TD PT72
1.0000 | MEDICATED_PATCH | TRANSDERMAL | Status: DC
  Administered 2022-10-03: 1 via TRANSDERMAL
  Filled 2022-09-27 (×2): qty 1

## 2022-09-27 MED ORDER — ONDANSETRON HCL 4 MG/2ML IJ SOLN: 4.0000 mg | Freq: Four times a day (QID) | INTRAMUSCULAR | Status: DC | PRN

## 2022-09-27 MED ORDER — SPIRONOLACTONE 12.5 MG HALF TABLET
100.0000 mg | ORAL_TABLET | Freq: Every day | ORAL | Status: DC
  Filled 2022-09-27 (×2): qty 8

## 2022-09-27 MED ORDER — NICOTINE 14 MG/24HR TD PT24
14.0000 mg | MEDICATED_PATCH | Freq: Every day | TRANSDERMAL | Status: DC
  Administered 2022-09-27 – 2022-10-03 (×5): 14 mg via TRANSDERMAL
  Filled 2022-09-27 (×6): qty 1

## 2022-09-27 MED ORDER — UMECLIDINIUM BROMIDE 62.5 MCG/ACT IN AEPB
1.0000 | INHALATION_SPRAY | Freq: Every day | RESPIRATORY_TRACT | Status: DC
  Administered 2022-09-28 – 2022-10-03 (×5): 1 via RESPIRATORY_TRACT
  Filled 2022-09-27: qty 7

## 2022-09-27 MED ORDER — LACTULOSE 10 GM/15ML PO SOLN
10.0000 g | Freq: Once | ORAL | Status: AC
  Administered 2022-09-27: 10 g via ORAL
  Filled 2022-09-27: qty 15

## 2022-09-27 NOTE — ED Notes (Signed)
Pt eating dinner

## 2022-09-27 NOTE — H&P (Signed)
History and Physical    Patient: [name] ZOX:[id number] DOB: 01/20/1956 DOA: 09/26/2022 DOS: the patient was seen and examined on 09/27/2022 PCP: [name], MD  Patient coming from: [facility]; NOK:  Elly Mod[facility], [phone]   Chief Complaint: SOB  HPI: [name] is a 67 y.o. male with medical history significant of NASH cirrhosis, COPD, CAD, HTN, AAA, chronic combined CHF, and DM presenting with SOB.   He was last admitted from 4/19-21 with a fall at [facility].  He was found to have AKI, a traumatic SDH, and scrotal cellulitis; Plavix was to be held for 2 weeks post-bleed.  He reports that since he returned to his facility he has been feeling very fatigued and SOB.  He has LE edema and stasis changes.    ER Course:  Carryover, per Dr. Joneen Roach:  Shortness of breath, CTA chest shows no PE or infection.  Shows small cirrhosis with mild splenomegaly.  Patient with multiple abnormalities, sodium 125, NH4 mildly elevated at 70.  Patient's mental status is okay.  Creatinine elevated at 1.45, however patient had metolazone.  Patient blood pressure did decrease to 80/50 but improved to systolic of 101 with 500 cc normal saline bolus.  Patient very edematous and fluid overloaded.  Overnight observation requested for diuresis and hyponatremia     Review of Systems: As mentioned in the history of present illness. All other systems reviewed and are negative. Past Medical History:  Diagnosis Date   AAA (abdominal aortic aneurysm) 09/2012--  monitored by dr Myra Gianotti   stable 5.6cm CTA abdomen 2016   Abnormal drug screen 07/09/2016   1/2/018 - positive oxycodone, fentanyl, inapprop positive MJ - mod risk   Allergic rhinitis    Ascites 03/2019   B12 deficiency    CAD (coronary artery disease) cardiologist-  dr Jens Som   x3 with stents last 2005, EF 40%, predominantly RCA by CT 2016   Cataracts, bilateral    Cervical spondylosis 05/2010   s/p surgery   Charcot  Hilda Lias Tooth muscular atrophy dx  1975   neurologist--  dr love--  type 2 per pt   Chronic pain syndrome    [facility] with [facility] --> disagreement and transfered care to Dr Regenia Skeeter at [facility] clinic [facility], requests PCP write Rx but f/u with pain clinic Q6-12 months   COPD (chronic obstructive pulmonary disease) 10/2011   minimal by PFTs   DDD (degenerative disc disease)    Disturbances of sensation of smell and taste    improving   Dyspnea on exertion    GERD (gastroesophageal reflux disease)    Gout    Headache    Hepatitis    hepatitis B   Hidradenitis    right groin   Hidradenitis suppurativa dx 2011   goin and leg crease   followd by Roxan Hockey - daily bactrim, s/p intralesional steroid injection 10/2010   Hip osteoarthritis    s/p intraarticular steroid shot (12/2012) (Ibazebo/Caffrey)   History of hepatitis B 1983   History of MI (myocardial infarction)    2000  &  2005   History of pneumonia    History of viral meningitis 2000   HLD (hyperlipidemia)    HTN (hypertension)    Ischemic cardiomyopathy    s/p inferior MI  --  current ef per myoview 39%   Liver cirrhosis secondary to NASH 01/2014   by CT scan, rec virtual colonoscopy by Dr Loreta Ave 06/2014   Lumbar herniated  disc    Myocardial infarction    x2   Nocturia more than twice per night    Obesity    Spinal stenosis    released from NSG.  established with [facility] (07/2013)   T2DM (type 2 diabetes mellitus)    ABIs WNL 2016   Vitamin D deficiency    Past Surgical History:  Procedure Laterality Date   ABDOMINAL AORTIC ENDOVASCULAR FENESTRATED STENT GRAFT N/A 11/30/2015   Procedure: ABDOMINAL AORTIC ENDOVASCULAR FENESTRATED STENT GRAFT;  Surgeon: Nada Libman, MD;  Location: [facility] OR;  Service: Vascular;  Laterality: N/A;   ANTERIOR CERVICAL DECOMP/DISCECTOMY FUSION  01-07-2010    C4 -- C7   CARDIAC CATHETERIZATION  03-30-2005  dr Samule Ohm   ef 40% w/ inferior akinesis/  LM and CFX  angiographically normal/  pLAD 30%/   Widely patent stents in RCA and PDA widely patent   CARDIOVASCULAR STRESS TEST  10-23-2012  dr Jens Som   No ischemia/  Moderate scar in the inferior wall, otherwise normal perfusion/  LV ef 39%,  LV wall motion: inferior/ inferolateral hypokinesis   COLONOSCOPY  05/06/2007   normal, small int hemorrhoids rpt 5 yrs due to fmhx - rec against rpt colonoscopy by Dr Loreta Ave   CORONARY ANGIOPLASTY  2000  dr Jens Som   PCI to RCA and PDA   CORONARY ANGIOPLASTY WITH STENT PLACEMENT  03-19-2005  dr Chrissie Noa downey   inferior STEMI--- DES x4 to RCA w/ balloon angioplasty and balloon angioplasty to jailed PDA ostium/  severe hypokinesis of midinferor wall, ef 50%/  dLM 20%,  mLAD 20%,  dCFX 60%   ESOPHAGOGASTRODUODENOSCOPY  01/2017   dilated benign esophageal stenosis, portal hypertensive gastropathy Marina Goodell)   ESOPHAGOGASTRODUODENOSCOPY (EGD) WITH PROPOFOL N/A 02/20/2018   benign biopsy Tobi Bastos, Sharlet Salina, MD)   ESOPHAGOGASTRODUODENOSCOPY (EGD) WITH PROPOFOL N/A 05/12/2019   Procedure: ESOPHAGOGASTRODUODENOSCOPY (EGD) WITH PROPOFOL;  Surgeon: Wyline Mood, MD;  Location: [facility];  Service: Gastroenterology;  Laterality: N/A;   HYDRADENITIS EXCISION Right 12/31/2014   Procedure: WIDE EXCISION HIDRADENITIS GROIN; Abigail Miyamoto, MD   IR PARACENTESIS  03/25/2019   LUMBAR DISC SURGERY     L5-S1   LUMBAR LAMINECTOMY  05-18-2010   L2--5   laminectomy/foraminotomy for stenosis ([place])   MYELOGRAM     L5-S1 and L1-2 spondylosis   SACROILIAC JOINT INJECTION Bilateral 10/2013   [place]   TONSILLECTOMY AND ADENOIDECTOMY  1972   Social History:  reports that he has been smoking cigarettes and e-cigarettes. He started smoking about 55 years ago. He has a 57.00 pack-year smoking history. He has never used smokeless tobacco. He reports current drug use. Drugs: Fentanyl and Hydrocodone. He reports that he does not drink alcohol.  Allergies  Allergen Reactions   Statins Shortness  Of Breath    Cough, trouble breathing Cough, trouble breathing   Losartan Other (See Comments)    Causes him to have pain   Wellbutrin [Bupropion] Other (See Comments)    Worsened mood - crying   Allopurinol Nausea Only   Baclofen Nausea And Vomiting   Penicillins Nausea And Vomiting    Did it involve swelling of the face/tongue/throat, SOB, or low BP? N/A Did it involve sudden or severe rash/hives, skin peeling, or any reaction on the inside of your mouth or nose? N/A Did you need to seek medical attention at a hospital or doctor's office? N/A When did it last happen? Child     If all above answers are "NO", may proceed with cephalosporin use.  Tramadol Nausea Only    Family History  Problem Relation Age of Onset   Cancer Mother        colon   Diabetes Mother    Kidney disease Mother    Aneurysm Mother        AAA   Rheum arthritis Mother    Charcot-Marie-Tooth disease Mother    Heart disease Mother        before age 79   Cancer Father        skin   Heart attack Father    Heart disease Father        before age 68   Cancer Brother        skin   Coronary artery disease Brother    Cancer Brother        small cell lung cancer   Aneurysm Brother        AAA   Rheum arthritis Sister    Rheum arthritis Brother    Prostate cancer Neg Hx    Bladder Cancer Neg Hx    Kidney cancer Neg Hx     Prior to Admission medications   Medication Sig Start Date End Date Taking? Authorizing Provider  albuterol (PROVENTIL) (2.5 MG/3ML) 0.083% nebulizer solution USE 1 VIAL PER NEBULIZER EVERY 6 HRS AS NEEDED FOR WHEEZING Patient taking differently: Take 2.5 mg by nebulization every 6 (six) hours as needed for wheezing or shortness of breath. 12/03/20   [name], MD  albuterol (VENTOLIN HFA) 108 (90 Base) MCG/ACT inhaler TAKE 2 PUFFS BY MOUTH EVERY 6 HOURS AS NEEDED FOR WHEEZE OR SHORTNESS OF BREATH Patient taking differently: Inhale 2 puffs into the lungs every 6 (six) hours as  needed for wheezing or shortness of breath. 12/14/21   [name], MD  carvedilol (COREG) 3.125 MG tablet Take 3.125 mg by mouth 2 (two) times daily.    [provider]  doxycycline (VIBRA-TABS) 100 MG tablet Take 1 tablet (100 mg total) by mouth 2 (two) times daily for 4 days. 09/24/22 09/28/22  Elgergawy, Leana Roe, MD  fentaNYL (DURAGESIC) 75 MCG/HR Place 1 patch onto the skin every 3 (three) days. 09/24/22   Elgergawy, Leana Roe, MD  ferrous sulfate 325 (65 FE) MG tablet Take 1 tablet (325 mg total) by mouth every other day. 04/29/19   [name], MD  folic acid (FOLVITE) 1 MG tablet TAKE 1 TABLET BY MOUTH EVERY DAY Patient not taking: Reported on 09/23/2022 11/07/21   [name], MD  gabapentin (NEURONTIN) 300 MG capsule Take 1 capsule (300 mg total) by mouth in the morning and at bedtime. 06/27/21   [name], MD  lactulose (CHRONULAC) 10 GM/15ML solution Take 30 mLs by mouth 4 (four) times daily.    [provider]  Lactulose 20 GM/30ML SOLN Take 45 mLs by mouth in the morning, at noon, and at bedtime.    [provider]  metolazone (ZAROXOLYN) 2.5 MG tablet Take 2.5 mg by mouth. Mondays and thursdays 09/21/22   [provider]  nicotine (NICODERM CQ - DOSED IN MG/24 HOURS) 14 mg/24hr patch APPLY 1 PATCH ONTO THE SKIN EVERY DAY Patient taking differently: Place 14 mg onto the skin daily. 02/09/22   [name], MD  nitroGLYCERIN (NITROSTAT) 0.4 MG SL tablet Place 1 tablet (0.4 mg total) under the tongue every 5 (five) minutes as needed for chest pain. 10/10/12   Lewayne Bunting, MD  [facility] powder Apply 1 Application topically 3 (three) times daily. 09/21/22  [provider]  ondansetron (ZOFRAN) 4 MG tablet TAKE 1 TABLET BY MOUTH EVERY 8 HOURS AS NEEDED FOR NAUSEA AND VOMITING Patient not taking: Reported on 09/23/2022 01/09/22   Wyline Mood, MD  oxyCODONE (OXY IR/ROXICODONE) 5 MG immediate release tablet Take 1 tablet (5 mg  total) by mouth every 8 (eight) hours as needed for severe pain. 09/24/22   Elgergawy, Leana Roe, MD  pantoprazole (PROTONIX) 40 MG tablet Take 40 mg by mouth daily.    [provider]  spironolactone (ALDACTONE) 100 MG tablet Take 0.5 tablets (50 mg total) by mouth daily. Patient taking differently: Take 100 mg by mouth daily. 05/09/22   Elgergawy, Leana Roe, MD  TART CHERRY PO Take 1 tablet by mouth in the morning and at bedtime.    [provider]  Tiotropium Bromide Monohydrate (SPIRIVA RESPIMAT) 2.5 MCG/ACT AERS Inhale 2 puffs into the lungs daily. 10/04/21   [name], MD  torsemide (DEMADEX) 20 MG tablet Take 20 mg by mouth daily. 08/18/22   [provider]  vitamin B-12 (CYANOCOBALAMIN) 500 MCG tablet Take 1 tablet (500 mcg total) by mouth every Monday, Wednesday, and Friday. 12/27/18   [name], MD  XIFAXAN 550 MG TABS tablet Take 550 mg by mouth 2 (two) times daily. 06/24/22   [provider]    Physical Exam: Vitals:   09/27/22 1300 09/27/22 1411 09/27/22 1453 09/27/22 1545  BP: 93/81 (!) 114/46 118/60 124/72  Pulse: 87 85 84 85  Resp: 20 18 16 19   Temp:      TempSrc:      SpO2: 97% 98% 95% 95%   General:  Appears calm and comfortable and is in NAD, chronically ill-appearing and somewhat debilitated Eyes:  EOMI, normal lids, iris ENT:  grossly normal hearing, lips & tongue, mmm; poor/absent dentition Neck:  no LAD, masses or thyromegaly Cardiovascular:  RRR, no m/r/g. 2-3+ chronic LE edema.  Respiratory:   CTA bilaterally with no wheezes/rales/rhonchi.  Normal respiratory effort. Abdomen:  soft, NT, ND Skin:  chronic stasis-related changes of B LE Musculoskeletal:  grossly normal tone BUE/BLE, good ROM, no bony abnormality Psychiatric:  grossly normal mood and affect, speech fluent and appropriate, AOx3 Neurologic:  CN 2-12 grossly intact, moves all extremities in coordinated fashion   Radiological Exams on  Admission: Independently reviewed - see discussion in A/P where applicable  CT Angio Chest PE W and/or Wo Contrast  Result Date: 09/27/2022 CLINICAL DATA:  Shortness of breath. EXAM: CT ANGIOGRAPHY CHEST WITH CONTRAST TECHNIQUE: Multidetector CT imaging of the chest was performed using the standard protocol during bolus administration of intravenous contrast. Multiplanar CT image reconstructions and MIPs were obtained to evaluate the vascular anatomy. RADIATION DOSE REDUCTION: This exam was performed according to the departmental dose-optimization program which includes automated exposure control, adjustment of the mA and/or kV according to patient size and/or use of iterative reconstruction technique. CONTRAST:  75mL OMNIPAQUE IOHEXOL 350 MG/ML SOLN COMPARISON:  September 22, 2022 FINDINGS: Cardiovascular: A single lead ventricular pacer is noted. There is marked severity calcification of the aortic arch, without evidence of aortic aneurysm. Satisfactory opacification of the pulmonary arteries to the segmental level. No evidence of pulmonary embolism. There is mild cardiomegaly with marked severity coronary artery calcification. No pericardial effusion. Mediastinum/Nodes: No enlarged mediastinal, hilar, or axillary lymph nodes. Thyroid gland, trachea, and esophagus demonstrate no significant findings. Lungs/Pleura: Mild atelectasis is seen along the posterior aspects of the right upper lobe, right middle lobe and right lower lobe.  No pleural effusion or pneumothorax is identified. Upper Abdomen: The liver is cirrhotic in appearance. The spleen is markedly enlarged. Multiple punctate parenchymal calcifications are seen within the head of the pancreas. There is stenting of the visualized portion of the suprarenal and infrarenal abdominal aorta. Bilateral renal artery stents are also noted. Musculoskeletal: Chronic appearing lateral ninth and posterior tenth and eleventh right rib fractures are seen. A metallic  density fixation plate and screws are seen along the anterior aspect of the lower cervical spine. Multilevel degenerative changes seen throughout the thoracic spine. Review of the MIP images confirms the above findings. IMPRESSION: 1. No evidence of pulmonary embolism. 2. Mild posterior right upper lobe, right middle lobe and right lower lobe atelectasis. 3. Mild cardiomegaly with marked severity coronary artery calcification. 4. Cirrhotic liver with marked splenomegaly. 5. Abdominal aortic stenting. 6. Chronic appearing lateral ninth and posterior tenth and eleventh right rib fractures. 7. Aortic atherosclerosis. Aortic Atherosclerosis (ICD10-I70.0). Electronically Signed   By: Aram Candela M.D.   On: 09/27/2022 01:13   DG Chest Port 1 View  Result Date: 09/26/2022 CLINICAL DATA:  Shortness of breath upon exertion. EXAM: PORTABLE CHEST 1 VIEW COMPARISON:  09/22/2022. FINDINGS: The heart is enlarged and the mediastinal contour is stable. Rightward shift of the heart and mediastinal structures is unchanged. The pulmonary vasculature is distended. No consolidation, effusion, or pneumothorax. A single lead pacemaker device is present over the left chest. Cervical spinal fusion hardware and abdominal aortic endograft are noted. No acute osseous abnormality. IMPRESSION: Cardiomegaly with pulmonary vascular congestion. Electronically Signed   By: Thornell Sartorius M.D.   On: 09/26/2022 22:34    EKG: Independently reviewed.  NSR with rate 76; prolonged QTc 501; RBBB with no evidence of acute ischemia   Labs on Admission: I have personally reviewed the available labs and imaging studies at the time of the admission.  Pertinent labs:    Na++ 125, baseline 133 K+ 3.2 Glucose 133 BUN 34/Creatinine 1.45/GFR 53 - stable AP 191 Albumin 2.2 AST 55/ALT 33/Bili 3.1 NH4 70 BNP 61.3 WBC 9.3 Hgb 11.6 Platelets 42   Assessment and Plan: Principal Problem:   Chronic dyspnea Active Problems:   Essential  hypertension   HLD (hyperlipidemia)   Cirrhosis of liver not due to alcohol   Chronic combined systolic and diastolic heart failure   COPD (chronic obstructive pulmonary disease)   Chronic pain syndrome   Obesity, morbid, BMI 40.0-49.9   Abdominal aortic aneurysm (AAA) without rupture   Subdural hematoma   Chronic hyponatremia   Chronic kidney disease, stage 3a    SOB -Patient recently discharged after traumatic SDH -He is not requiring O2 supplementation -CXR with some vascular congestion -Chest CT unremarkable, no PE -He does have atelectasis and this is likely the cause, associated with deconditioning -Will order q2h IS -Observation in progressive care for now  Acute on chronic hyponatremia -This is likely related to his liver disease -He appears to be euvolemic at this time -Will order a Na++ restricted, fluid restricted diet -Recheck in AM  Recent SDH -Traumatic associated with recent fall -Will repeat head CT today -He should be holding Plavix through at least 5/3  Chronic combined CHF -Has stasis dermatitis without obvious acute exacerbation -Normal BNP -Possibly mildly overdiuresed -Hold torsemide and metolazone for now -Continue spironolactone (if BP will allow)  Cirrhosis -h/o portal HTN, esophageal varices -MELD/MELD-Na score is 27, with a mortality rate of 19.6% -Child-Pugh category is B, with an 80% 1-year survival -Recommend 2  gram sodium restricted diet with nutritional education; consult requested -Continue spironolactone  -Continue lactulose QID -Continue Rifaximin  CAD -No c/o CP -Marked severity coronary artery calcification seen on CT  AAA -s/p stenting  Scrotal cellulitis -Diagnosed during last hospitalization -Should be completing doxycycline tomorrow   HTN -Continue carvedilol  HLD -Statin intolerant  Stage 3a CKD -Appears to be stable at this time -Attempt to avoid nephrotoxic medications -Recheck BMP in AM   Tobacco  dependence with COPD -Continue nicotine patch  -Not smoking in SNF -Continue albuterol  Chronic pain -I have reviewed this patient in the [place] Controlled Substances Reporting System.  He is receiving medications from multiple providers and is at high risk of opioid misuse or diversion but not overdose.  -Continue gabapentin, fentanyl patch and oxycodone as needed   Morbid obesity -BMI 42.84 -Weight loss should be encouraged -Outpatient PCP/bariatric medicine/bariatric surgery f/u encouraged   DNR -I have discussed code status with the patient and he would not desire resuscitation and would prefer to die a natural death should that situation arise. -ACP documents reviewed and are present at the bedside      Advance Care Planning:   Code Status: DNR   Consults: TOC team; nutrition; PT/OT  DVT Prophylaxis: SCDs  Family Communication: None present; I was unable to reach his sister by telephone following admission  Severity of Illness: The appropriate patient status for this patient is OBSERVATION. Observation status is judged to be reasonable and necessary in order to provide the required intensity of service to ensure the patient's safety. The patient's presenting symptoms, physical exam findings, and initial radiographic and laboratory data in the context of their medical condition is felt to place them at decreased risk for further clinical deterioration. Furthermore, it is anticipated that the patient will be medically stable for discharge from the hospital within 2 midnights of admission.   Author: Jonah Blue, MD 09/27/2022 3:54 PM  For on call review [email or web address].

## 2022-09-27 NOTE — Hospital Course (Signed)
This is a 66-year-old male with dical history significant of  cirrhosis not alcohol related, COPD, history of coronary artery disease with MI, HTN, AAA, CHF, T2DM, and spinal stenosis.  He was recently admitted for 4/19-4/21 with SDH.  Likely from traumatic fall.  He also has cellulitis of the scrotum.  He Rhee presents today with complaint of shortness of breath. CTA chest shows no PE or infection.  Shows small cirrhosis with mild splenomegaly.  Patient with multiple abnormalities, sodium 125 mildly elevated at 70.  Patient's mental status is okay.  Creatinine elevated at 1.45, however patient had metolazone.  Patient blood pressure did decrease to 80/50 but improved to systolic of 101 with 500 cc normal saline bolus.  Patient very edematous and fluid overloaded.  Overnight observation requested for diuresis and hyponatremia 

## 2022-09-27 NOTE — ED Notes (Signed)
Gave patient some coffee and repositioned him.

## 2022-09-27 NOTE — Progress Notes (Signed)
This is a 67 year old male with dical history significant of  cirrhosis not alcohol related, COPD, history of coronary artery disease with MI, HTN, AAA, CHF, T2DM, and spinal stenosis.  He was recently admitted for 4/19-4/21 with SDH.  Likely from traumatic fall.  He also has cellulitis of the scrotum.  He [name] presents today with complaint of shortness of breath. CTA chest shows no PE or infection.  Shows small cirrhosis with mild splenomegaly.  Patient with multiple abnormalities, sodium 125 mildly elevated at 70.  Patient's mental status is okay.  Creatinine elevated at 1.45, however patient had metolazone.  Patient blood pressure did decrease to 80/50 but improved to systolic of 101 with 500 cc normal saline bolus.  Patient very edematous and fluid overloaded.  Overnight observation requested for diuresis and hyponatremia

## 2022-09-28 ENCOUNTER — Observation Stay (HOSPITAL_COMMUNITY): Payer: Medicare HMO

## 2022-09-28 ENCOUNTER — Encounter (HOSPITAL_COMMUNITY): Payer: Self-pay | Admitting: Internal Medicine

## 2022-09-28 ENCOUNTER — Other Ambulatory Visit: Payer: Self-pay

## 2022-09-28 DIAGNOSIS — D539 Nutritional anemia, unspecified: Secondary | ICD-10-CM | POA: Diagnosis present

## 2022-09-28 DIAGNOSIS — I714 Abdominal aortic aneurysm, without rupture, unspecified: Secondary | ICD-10-CM | POA: Diagnosis present

## 2022-09-28 DIAGNOSIS — E860 Dehydration: Secondary | ICD-10-CM | POA: Diagnosis present

## 2022-09-28 DIAGNOSIS — I5021 Acute systolic (congestive) heart failure: Secondary | ICD-10-CM | POA: Diagnosis not present

## 2022-09-28 DIAGNOSIS — N492 Inflammatory disorders of scrotum: Secondary | ICD-10-CM | POA: Diagnosis present

## 2022-09-28 DIAGNOSIS — I6200 Nontraumatic subdural hemorrhage, unspecified: Secondary | ICD-10-CM | POA: Diagnosis not present

## 2022-09-28 DIAGNOSIS — Z515 Encounter for palliative care: Secondary | ICD-10-CM | POA: Diagnosis not present

## 2022-09-28 DIAGNOSIS — R0602 Shortness of breath: Secondary | ICD-10-CM | POA: Diagnosis not present

## 2022-09-28 DIAGNOSIS — Z6841 Body Mass Index (BMI) 40.0 and over, adult: Secondary | ICD-10-CM | POA: Diagnosis not present

## 2022-09-28 DIAGNOSIS — K219 Gastro-esophageal reflux disease without esophagitis: Secondary | ICD-10-CM | POA: Diagnosis present

## 2022-09-28 DIAGNOSIS — I5042 Chronic combined systolic (congestive) and diastolic (congestive) heart failure: Secondary | ICD-10-CM

## 2022-09-28 DIAGNOSIS — E871 Hypo-osmolality and hyponatremia: Secondary | ICD-10-CM | POA: Diagnosis present

## 2022-09-28 DIAGNOSIS — I872 Venous insufficiency (chronic) (peripheral): Secondary | ICD-10-CM | POA: Diagnosis present

## 2022-09-28 DIAGNOSIS — N1831 Chronic kidney disease, stage 3a: Secondary | ICD-10-CM | POA: Diagnosis present

## 2022-09-28 DIAGNOSIS — I8500 Esophageal varices without bleeding: Secondary | ICD-10-CM | POA: Diagnosis present

## 2022-09-28 DIAGNOSIS — D638 Anemia in other chronic diseases classified elsewhere: Secondary | ICD-10-CM | POA: Diagnosis present

## 2022-09-28 DIAGNOSIS — E785 Hyperlipidemia, unspecified: Secondary | ICD-10-CM | POA: Diagnosis present

## 2022-09-28 DIAGNOSIS — I10 Essential (primary) hypertension: Secondary | ICD-10-CM

## 2022-09-28 DIAGNOSIS — E1122 Type 2 diabetes mellitus with diabetic chronic kidney disease: Secondary | ICD-10-CM | POA: Diagnosis present

## 2022-09-28 DIAGNOSIS — E7220 Disorder of urea cycle metabolism, unspecified: Secondary | ICD-10-CM | POA: Diagnosis not present

## 2022-09-28 DIAGNOSIS — J811 Chronic pulmonary edema: Secondary | ICD-10-CM | POA: Diagnosis not present

## 2022-09-28 DIAGNOSIS — E8770 Fluid overload, unspecified: Secondary | ICD-10-CM | POA: Diagnosis present

## 2022-09-28 DIAGNOSIS — R0609 Other forms of dyspnea: Secondary | ICD-10-CM | POA: Diagnosis not present

## 2022-09-28 DIAGNOSIS — R161 Splenomegaly, not elsewhere classified: Secondary | ICD-10-CM | POA: Diagnosis present

## 2022-09-28 DIAGNOSIS — K7460 Unspecified cirrhosis of liver: Secondary | ICD-10-CM | POA: Diagnosis present

## 2022-09-28 DIAGNOSIS — Z66 Do not resuscitate: Secondary | ICD-10-CM | POA: Diagnosis present

## 2022-09-28 DIAGNOSIS — K7581 Nonalcoholic steatohepatitis (NASH): Secondary | ICD-10-CM | POA: Diagnosis present

## 2022-09-28 DIAGNOSIS — W19XXXD Unspecified fall, subsequent encounter: Secondary | ICD-10-CM | POA: Diagnosis present

## 2022-09-28 DIAGNOSIS — J449 Chronic obstructive pulmonary disease, unspecified: Secondary | ICD-10-CM | POA: Diagnosis present

## 2022-09-28 DIAGNOSIS — K766 Portal hypertension: Secondary | ICD-10-CM | POA: Diagnosis present

## 2022-09-28 DIAGNOSIS — I959 Hypotension, unspecified: Secondary | ICD-10-CM | POA: Diagnosis present

## 2022-09-28 DIAGNOSIS — I130 Hypertensive heart and chronic kidney disease with heart failure and stage 1 through stage 4 chronic kidney disease, or unspecified chronic kidney disease: Secondary | ICD-10-CM | POA: Diagnosis present

## 2022-09-28 LAB — CBG MONITORING, ED: Glucose-Capillary: 130 mg/dL — ABNORMAL HIGH (ref 70–99)

## 2022-09-28 LAB — CBC
HCT: 31.9 % — ABNORMAL LOW (ref 39.0–52.0)
Hemoglobin: 11.3 g/dL — ABNORMAL LOW (ref 13.0–17.0)
MCH: 36.3 pg — ABNORMAL HIGH (ref 26.0–34.0)
MCHC: 35.4 g/dL (ref 30.0–36.0)
MCV: 102.6 fL — ABNORMAL HIGH (ref 80.0–100.0)
Platelets: 41 10*3/uL — ABNORMAL LOW (ref 150–400)
RBC: 3.11 MIL/uL — ABNORMAL LOW (ref 4.22–5.81)
RDW: 16.5 % — ABNORMAL HIGH (ref 11.5–15.5)
WBC: 7.3 10*3/uL (ref 4.0–10.5)
nRBC: 0 % (ref 0.0–0.2)

## 2022-09-28 LAB — BASIC METABOLIC PANEL
Anion gap: 9 (ref 5–15)
BUN: 33 mg/dL — ABNORMAL HIGH (ref 8–23)
CO2: 29 mmol/L (ref 22–32)
Calcium: 8.1 mg/dL — ABNORMAL LOW (ref 8.9–10.3)
Chloride: 92 mmol/L — ABNORMAL LOW (ref 98–111)
Creatinine, Ser: 1.2 mg/dL (ref 0.61–1.24)
GFR, Estimated: >60 mL/min (ref >60)
Glucose, Bld: 111 mg/dL — ABNORMAL HIGH (ref 70–99)
Potassium: 3.3 mmol/L — ABNORMAL LOW (ref 3.5–5.1)
Sodium: 130 mmol/L — ABNORMAL LOW (ref 135–145)

## 2022-09-28 LAB — ECHOCARDIOGRAM COMPLETE
Area-P 1/2: 3.27 cm2
Height: 70 in
S' Lateral: 4.8 cm
Single Plane A4C EF: 46.4 %
Weight: 4480 oz

## 2022-09-28 LAB — VITAMIN B12: Vitamin B-12: 958 pg/mL — ABNORMAL HIGH (ref 180–914)

## 2022-09-28 LAB — AMMONIA: Ammonia: 120 umol/L — ABNORMAL HIGH (ref 9–35)

## 2022-09-28 LAB — BRAIN NATRIURETIC PEPTIDE: B Natriuretic Peptide: 102.5 pg/mL — ABNORMAL HIGH (ref 0.0–100.0)

## 2022-09-28 LAB — FOLATE: Folate: 14 ng/mL (ref >5.9)

## 2022-09-28 MED ORDER — POTASSIUM CHLORIDE CRYS ER 20 MEQ PO TBCR
40.0000 meq | EXTENDED_RELEASE_TABLET | Freq: Once | ORAL | Status: AC
  Administered 2022-09-28: 40 meq via ORAL
  Filled 2022-09-28: qty 2

## 2022-09-28 MED ORDER — DOXYCYCLINE HYCLATE 100 MG PO TABS
100.0000 mg | ORAL_TABLET | Freq: Two times a day (BID) | ORAL | Status: AC
  Administered 2022-09-28 (×2): 100 mg via ORAL
  Filled 2022-09-28 (×2): qty 1

## 2022-09-28 MED ORDER — FUROSEMIDE 10 MG/ML IJ SOLN: 40.0000 mg | Freq: Every day | INTRAMUSCULAR | Status: DC

## 2022-09-28 MED ORDER — FUROSEMIDE 10 MG/ML IJ SOLN
20.0000 mg | Freq: Every day | INTRAMUSCULAR | Status: DC
  Administered 2022-09-28 – 2022-09-29 (×2): 20 mg via INTRAVENOUS
  Filled 2022-09-28 (×2): qty 2

## 2022-09-28 NOTE — Progress Notes (Signed)
[facility]                                                                              Barbara Keng, is a 67 y.o. male, DOB - 07-22-1955, ZOX:[id number] Admit date - 09/26/2022    Outpatient Primary MD for the patient is [name], MD  LOS - 0  days  Chief Complaint  Patient presents with   Shortness of Breath       Brief summary   Patient is a 67 year old male with NASH cirrhosis, COPD, CAD, HTN, AAA, chronic combined CHF, and DM presented with shortness of breath. He was last admitted from 4/19-21 with a fall at [facility].  He was found to have AKI, a traumatic SDH, and scrotal cellulitis; Plavix was to be held for 2 weeks post-bleed.  He reports that since he returned to his facility he has been feeling very fatigued and SOB.  He has LE edema and stasis changes.  In ED, CTA chest shows no PE or infection, cirrhosis with mild splenomegaly.  Labs showed sodium 125, NH4 mildly elevated at 70.  Creatinine elevated at 1.45.  BP did decrease to 80/50 but improved to systolic of 101 with 500 cc normal saline bolus.  Patient very edematous and fluid overloaded.   Admitted for further workup. Assessment & Plan    Principal Problem:  Acute on chronic dyspnea with generalized weakness Chronic systolic CHF -Currently not on any O2, chest x-ray with cardiomegaly and pulmonary vascular congestion.  CT chest unremarkable, no PE, mild posterior right upper lobe, middle lobe and lower lobe atelectasis.  -+ Lower extremity edema with stasis, obtain BNP -Continue incentive spirometry -2D echo 10/2018 had shown EF 35 to 40%, diffuse hypokinesis, will repeat echo -DC IV fluids, BP currently soft, placed on Lasix 20 mg IV daily (outpatient on torsemide, metolazone, Aldactone)   Active Problems: Acute on chronic hyponatremia -Likely related to cirrhosis, liver disease, hypervolemia -Sodium 125 on admission, improved to 130 -Continue fluid restriction    Essential  hypertension -BP soft, hold Aldactone, Coreg, metolazone, torsemide  Recent subdural hematoma with fall -Repeat CT head showed left parafalcine subdural hematoma, superior component appears to have slightly decreased in size, posterior component is stable, no new areas of hemorrhage -Patient should be holding Plavix to 5/3 per previous discharge instructions  Liver cirrhosis -History of portal hypertension, esophageal varices -MELD/MELD-Na score is 27, with a mortality rate of 19.6% -Holding spironolactone due to soft BP.  Continue lactulose, rifaximin  Coronary artery disease, HLD -Currently no chest pain.  Marked coronary artery calcification on CT. -Continue to hold Plavix -Statin intolerant    Scrotal cellulitis -Diagnosed during last hospitalization -Should be completing doxycycline today    chronic kidney disease stage IIIa -Creatinine 1.2, appears to be close to baseline    Tobacco dependence with COPD -Continue nicotine patch, not smoking at SNF -Continue albuterol   Chronic pain -Continue gabapentin, fentanyl patch and oxycodone as needed  Macrocytic anemia -MCV 105.2 on admission, H&H close to baseline -Obtain B12, folate   Morbid obesity Estimated body mass index is 40.18 kg/m as calculated from the following:  Height as of this encounter: 5\' 10"  (1.778 m).   Weight as of this encounter: 127 kg.  Code Status: DNR DVT Prophylaxis:  SCDs Start: 09/27/22 1019   Level of Care: Level of care: Progressive Family Communication: Updated patient Disposition Plan:      Remains inpatient appropriate: Workup in progress   Procedures:  None  Consultants:   None  Antimicrobials:   Anti-infectives (From admission, onward)    Start     Dose/Rate Route Frequency Ordered Stop   09/28/22 1000  doxycycline (VIBRA-TABS) tablet 100 mg        100 mg Oral 2 times daily 09/28/22 0726 09/29/22 0959   09/27/22 1030  doxycycline (VIBRA-TABS) tablet 100 mg  Status:   Discontinued        100 mg Oral 2 times daily 09/27/22 1019 09/28/22 0726   09/27/22 1030  rifaximin (XIFAXAN) tablet 550 mg        550 mg Oral 2 times daily 09/27/22 1019            Medications  docusate sodium  100 mg Oral BID   doxycycline  100 mg Oral BID   fentaNYL  1 patch Transdermal Q72H   gabapentin  300 mg Oral BID   lactulose  20 g Oral QID   nicotine  14 mg Transdermal Daily   pantoprazole  40 mg Oral Daily   rifaximin  550 mg Oral BID   sodium chloride flush  3 mL Intravenous Q12H   spironolactone  100 mg Oral Daily   umeclidinium bromide  1 puff Inhalation Daily      Subjective:   [name] was seen and examined today.  Appears very weak, fatigued.  No acute chest pain, abdominal pain, nausea or vomiting.  No acute shortness of breath on exam.  Objective:   Vitals:   09/28/22 0727 09/28/22 0730 09/28/22 0800 09/28/22 0930  BP:   (!) 103/45 (!) 123/55  Pulse:  83 81 85  Resp:  17 15 13   Temp: 98.2 F (36.8 C)     TempSrc: Oral     SpO2:  93% 95% 99%  Weight:      Height:       No intake or output data in the 24 hours ending 09/28/22 1037   Wt Readings from Last 3 Encounters:  09/28/22 127 kg  09/24/22 131.6 kg  07/08/22 (!) 143.8 kg     Exam General: Alert and oriented x self and place, NAD, ill-appearing Cardiovascular: S1 S2 auscultated,  RRR Respiratory: Diminished breath sound at the bases Gastrointestinal: Obese, soft, nontender, ND, NBS Ext:2+ pedal edema bilaterally Neuro: Strength 5/5 upper and lower extremities bilaterally Skin: Chronic venous stasis to his bilateral lower extremities Psych: Normal affect     Data Reviewed:  I have personally reviewed following labs    CBC Lab Results  Component Value Date   WBC 7.3 09/28/2022   RBC 3.11 (L) 09/28/2022   HGB 11.3 (L) 09/28/2022   HCT 31.9 (L) 09/28/2022   MCV 102.6 (H) 09/28/2022   MCH 36.3 (H) 09/28/2022   PLT 41 (L) 09/28/2022   MCHC 35.4 09/28/2022   RDW  16.5 (H) 09/28/2022   LYMPHSABS 0.7 09/27/2022   MONOABS 0.9 09/27/2022   EOSABS 0.1 09/27/2022   BASOSABS 0.0 09/27/2022     Last metabolic panel Lab Results  Component Value Date   NA 130 (L) 09/28/2022   K 3.3 (L) 09/28/2022   CL 92 (L) 09/28/2022  CO2 29 09/28/2022   BUN 33 (H) 09/28/2022   CREATININE 1.20 09/28/2022   GLUCOSE 111 (H) 09/28/2022   GFRNONAA >60 09/28/2022   GFRAA 107 12/09/2019   CALCIUM 8.1 (L) 09/28/2022   PHOS 3.1 03/24/2019   PROT 8.9 (H) 09/27/2022   ALBUMIN 2.2 (L) 09/27/2022   LABGLOB 3.7 07/18/2021   AGRATIO 0.8 (L) 07/18/2021   BILITOT 3.1 (H) 09/27/2022   ALKPHOS 191 (H) 09/27/2022   AST 55 (H) 09/27/2022   ALT 33 09/27/2022   ANIONGAP 9 09/28/2022    CBG (last 3)  Recent Labs    09/28/22 0016  GLUCAP 130*      Coagulation Profile: Recent Labs  Lab 09/22/22 1723  INR 1.4*     Radiology Studies: I have personally reviewed the imaging studies  CT HEAD WO CONTRAST ( )  Result Date: 09/27/2022 CLINICAL DATA:  Follow-up subdural hematoma EXAM: CT HEAD WITHOUT CONTRAST TECHNIQUE: Contiguous axial images were obtained from the base of the skull through the vertex without intravenous contrast. RADIATION DOSE REDUCTION: This exam was performed according to the departmental dose-optimization program which includes automated exposure control, adjustment of the mA and/or kV according to patient size and/or use of iterative reconstruction technique. COMPARISON:  09/23/2022 FINDINGS: Brain: Again noted is the left para falcine subdural hematoma. This measures 5 mm in thickness compared to 6 mm previously. Overall, the size of the subdural hematoma appears to have decreased. Subdural hematoma along the posterior falx measures 4 mm, stable since prior study. No new areas of hemorrhage. No infarct or hydrocephalus. Mild cerebral atrophy. Vascular: No hyperdense vessel or unexpected calcification. Skull: No acute calvarial abnormality.  Sinuses/Orbits: No acute findings Other: None IMPRESSION: Left parafalcine subdural hematoma again noted. The superior component appears to have slightly decreased in size wall the posterior component is stable. No new areas of hemorrhage. Electronically Signed   By: Charlett Nose M.D.   On: 09/27/2022 18:19   CT Angio Chest PE W and/or Wo Contrast  Result Date: 09/27/2022 CLINICAL DATA:  Shortness of breath. EXAM: CT ANGIOGRAPHY CHEST WITH CONTRAST TECHNIQUE: Multidetector CT imaging of the chest was performed using the standard protocol during bolus administration of intravenous contrast. Multiplanar CT image reconstructions and MIPs were obtained to evaluate the vascular anatomy. RADIATION DOSE REDUCTION: This exam was performed according to the departmental dose-optimization program which includes automated exposure control, adjustment of the mA and/or kV according to patient size and/or use of iterative reconstruction technique. CONTRAST:  75mL OMNIPAQUE IOHEXOL 350 MG/ML SOLN COMPARISON:  September 22, 2022 FINDINGS: Cardiovascular: A single lead ventricular pacer is noted. There is marked severity calcification of the aortic arch, without evidence of aortic aneurysm. Satisfactory opacification of the pulmonary arteries to the segmental level. No evidence of pulmonary embolism. There is mild cardiomegaly with marked severity coronary artery calcification. No pericardial effusion. Mediastinum/Nodes: No enlarged mediastinal, hilar, or axillary lymph nodes. Thyroid gland, trachea, and esophagus demonstrate no significant findings. Lungs/Pleura: Mild atelectasis is seen along the posterior aspects of the right upper lobe, right middle lobe and right lower lobe. No pleural effusion or pneumothorax is identified. Upper Abdomen: The liver is cirrhotic in appearance. The spleen is markedly enlarged. Multiple punctate parenchymal calcifications are seen within the head of the pancreas. There is stenting of the  visualized portion of the suprarenal and infrarenal abdominal aorta. Bilateral renal artery stents are also noted. Musculoskeletal: Chronic appearing lateral ninth and posterior tenth and eleventh right rib fractures are seen. A metallic density  fixation plate and screws are seen along the anterior aspect of the lower cervical spine. Multilevel degenerative changes seen throughout the thoracic spine. Review of the MIP images confirms the above findings. IMPRESSION: 1. No evidence of pulmonary embolism. 2. Mild posterior right upper lobe, right middle lobe and right lower lobe atelectasis. 3. Mild cardiomegaly with marked severity coronary artery calcification. 4. Cirrhotic liver with marked splenomegaly. 5. Abdominal aortic stenting. 6. Chronic appearing lateral ninth and posterior tenth and eleventh right rib fractures. 7. Aortic atherosclerosis. Aortic Atherosclerosis (ICD10-I70.0). Electronically Signed   By: Aram Candela M.D.   On: 09/27/2022 01:13   DG Chest Port 1 View  Result Date: 09/26/2022 CLINICAL DATA:  Shortness of breath upon exertion. EXAM: PORTABLE CHEST 1 VIEW COMPARISON:  09/22/2022. FINDINGS: The heart is enlarged and the mediastinal contour is stable. Rightward shift of the heart and mediastinal structures is unchanged. The pulmonary vasculature is distended. No consolidation, effusion, or pneumothorax. A single lead pacemaker device is present over the left chest. Cervical spinal fusion hardware and abdominal aortic endograft are noted. No acute osseous abnormality. IMPRESSION: Cardiomegaly with pulmonary vascular congestion. Electronically Signed   By: Thornell Sartorius M.D.   On: 09/26/2022 22:34       Hayla Hinger M.D. [facility] 09/28/2022, 10:37 AM  Available via Epic secure chat 7am-7pm After 7 pm, please refer to night coverage provider listed on amion.

## 2022-09-28 NOTE — ED Notes (Signed)
Repositioned patient in bed. Said he felt short of breath, likely due to his postioning. O2 levels normal, put on 1L of O2 for his comfort and sat him up. States he feels better. Patient is alert, oriented, calm and cooperative.

## 2022-09-28 NOTE — Evaluation (Signed)
Occupational Therapy Evaluation Patient Details Name: [name] MRN: [id number] DOB: [birth year] Today's Date: 09/28/2022   History of Present Illness Pt is a 67 y.o. M who presents 09/26/2022 with fatigue and shortness of breath from [facility] where he is a resident. + fluid overload, stasis changes and hyponatremia. CT scan showed 4 mm SDH. Significant PMH: Admitted 4/19-4/21/24 with SDH likely from traumatic fall, cirrhosis, COPD, CAD with MI, HTN, AAA, CHF, T2DM, spinal stenosis.   Clinical Impression   Pt reports typically being able to walk with a rollator and is assisted for ADLs with exception of eating. Pt presents with buttocks pain, generalized weakness, decreased activity tolerance and poor standing balance with flexed posture. No family available to offer information about pt's baseline cognition, he appears to be a poor historian. Pt requires +2 assist for all mobility. He is able to stand and take side steps toward [facility] He requires set up to total assist for ADLs. VSS on 2L O2. Pt plans to return to SNF upon discharge.      Recommendations for follow up therapy are one component of a multi-disciplinary discharge planning process, led by the attending physician.  Recommendations may be updated based on patient status, additional functional criteria and insurance authorization.   Assistance Recommended at Discharge Frequent or constant Supervision/Assistance  Patient can return home with the following Two people to help with walking and/or transfers;A lot of help with bathing/dressing/bathroom;Assistance with cooking/housework;Direct supervision/assist for medications management;Direct supervision/assist for financial management;Assist for transportation;Help with stairs or ramp for entrance    Functional Status Assessment  Patient has had a recent decline in their functional status and/or demonstrates limited ability to make significant improvements in function in a  reasonable and predictable amount of time  Equipment Recommendations  Other (comment) (defer to SNF)    Recommendations for Other Services       Precautions / Restrictions Precautions Precautions: Fall Restrictions Weight Bearing Restrictions: No      Mobility Bed Mobility Overal bed mobility: Needs Assistance Bed Mobility: Supine to Sit, Sit to Supine     Supine to sit: +2 for physical assistance, Max assist Sit to supine: Mod assist, +2 for physical assistance, +2 for safety/equipment, HOB elevated        Transfers Overall transfer level: Needs assistance Equipment used: Rolling walker (2 wheels) Transfers: Sit to/from Stand Sit to Stand: Min assist, +2 physical assistance, +2 safety/equipment, From elevated surface           General transfer comment: Min assist to initiate power up and pt able to rise to RW. flexed trunk position and pt reports due to chronic back pain.      Balance Overall balance assessment: Needs assistance   Sitting balance-Leahy Scale: Fair     Standing balance support: Bilateral upper extremity supported Standing balance-Leahy Scale: Poor                             ADL either performed or assessed with clinical judgement   ADL Overall ADL's : Needs assistance/impaired Eating/Feeding: Independent;Bed level   Grooming: Supervision/safety;Sitting   Upper Body Bathing: Moderate assistance;Sitting   Lower Body Bathing: Total assistance;+2 for physical assistance;Sit to/from stand   Upper Body Dressing : Moderate assistance;Sitting   Lower Body Dressing: +2 for physical assistance;Total assistance;Sit to/from stand       Toileting- Architect and Hygiene: Total assistance;+2 for physical assistance;Sit to/from stand  Vision Ability to See in Adequate Light: 0 Adequate       Perception     Praxis      Pertinent Vitals/Pain Pain Assessment Pain Assessment: Faces Faces Pain  Scale: Hurts little more Pain Location: buttocks Pain Descriptors / Indicators: Discomfort Pain Intervention(s): Monitored during session, Repositioned     Hand Dominance Right   Extremity/Trunk Assessment Upper Extremity Assessment Upper Extremity Assessment: Generalized weakness (tremor)   Lower Extremity Assessment Lower Extremity Assessment: Defer to PT evaluation   Cervical / Trunk Assessment Cervical / Trunk Assessment: Other exceptions Cervical / Trunk Exceptions: weakness, obesity   Communication Communication Communication: No difficulties   Cognition Arousal/Alertness: Awake/alert Behavior During Therapy: WFL for tasks assessed/performed Overall Cognitive Status: No family/caregiver present to determine baseline cognitive functioning                                 General Comments: poor historian     General Comments       Exercises     Shoulder Instructions      Home Living Family/patient expects to be discharged to:: Skilled nursing facility     Type of Home: Mobile home (double wide) Home Access: Ramped entrance     Home Layout: One level                   Additional Comments: pt gets OOB on his own and gets his clothes out and dresses himself      Prior Functioning/Environment Prior Level of Function : Needs assist               ADLs Comments: pt states requiring assist with ADL's, able to self feed        OT Problem List: Decreased activity tolerance;Impaired balance (sitting and/or standing);Decreased strength;Decreased cognition;Decreased safety awareness;Pain;Obesity;Cardiopulmonary status limiting activity      OT Treatment/Interventions: Self-care/ADL training;DME and/or AE instruction;Therapeutic activities;Patient/family education;Balance training    OT Goals(Current goals can be found in the care plan section) Acute Rehab OT Goals OT Goal Formulation: With patient Time For Goal Achievement:  10/12/22 Potential to Achieve Goals: Fair ADL Goals Pt Will Perform Grooming: with supervision;sitting Pt Will Perform Upper Body Bathing: with min assist;sitting Pt Will Perform Upper Body Dressing: with supervision;sitting Pt Will Transfer to Toilet: with mod assist;stand pivot transfer;bedside commode Additional ADL Goal #1: Pt will perform bed mobility with moderate assistance in preparation for ADLs.  OT Frequency: Min 2X/week    Co-evaluation PT/OT/SLP Co-Evaluation/Treatment: Yes Reason for Co-Treatment: For patient/therapist safety PT goals addressed during session: Mobility/safety with mobility;Balance;Proper use of DME OT goals addressed during session: ADL's and self-care      AM-PAC OT "6 Clicks" Daily Activity     Outcome Measure Help from another person eating meals?: None Help from another person taking care of personal grooming?: A Little Help from another person toileting, which includes using toliet, bedpan, or urinal?: Total Help from another person bathing (including washing, rinsing, drying)?: A Lot Help from another person to put on and taking off regular upper body clothing?: A Lot Help from another person to put on and taking off regular lower body clothing?: Total 6 Click Score: 13   End of Session Equipment Utilized During Treatment: Rolling walker (2 wheels);Oxygen (2L)  Activity Tolerance: Patient tolerated treatment well Patient left: in bed;with call bell/phone within reach;with bed alarm set  OT Visit Diagnosis: Unsteadiness on feet (R26.81);Other abnormalities of  gait and mobility (R26.89);Pain;Muscle weakness (generalized) (M62.81);Other symptoms and signs involving cognitive function                Time: 1530-1550 OT Time Calculation (min): 20 min Charges:  OT General Charges $OT Visit: 1 Visit OT Evaluation $OT Eval Moderate Complexity: 1 Mod  Berna Spare, OTR/L Acute Rehabilitation Services Office: [phone]   Evern Bio 09/28/2022, 4:43 PM

## 2022-09-28 NOTE — ED Notes (Signed)
ED TO INPATIENT HANDOFF REPORT  ED Nurse Name and Phone #:   S Name/Age/Gender Princella Pellegrini 67 y.o. male Room/Bed: 010C/010C  Code Status   Code Status: DNR  Home/SNF/Other Nursing Home  Is this baseline? Yes   Triage Complete: Triage complete  Chief Complaint Volume overload [E87.70]  Triage Note SOB upon exertion. Recent brain bleed 5 days ago. Coming from [place]. This has been going on for 3 days. Sats WNL. States can't catch good breath. COPD and CHF history. Stomach cramping and headache. Denies headache being worse from fall.    Allergies Allergies  Allergen Reactions   Statins Shortness Of Breath    Cough, trouble breathing Cough, trouble breathing   Losartan Other (See Comments)    Causes him to have pain   Wellbutrin [Bupropion] Other (See Comments)    Worsened mood - crying   Allopurinol Nausea Only   Baclofen Nausea And Vomiting   Penicillins Nausea And Vomiting    Did it involve swelling of the face/tongue/throat, SOB, or low BP? N/A Did it involve sudden or severe rash/hives, skin peeling, or any reaction on the inside of your mouth or nose? N/A Did you need to seek medical attention at a hospital or doctor's office? N/A When did it last happen? Child     If all above answers are "NO", may proceed with cephalosporin use.   Tramadol Nausea Only    Level of Care/Admitting Diagnosis ED Disposition     ED Disposition  Admit   Condition  --   Comment  Hospital Area[facility] [[id number]]  Level of Care: Progressive [102]  Admit to Progressive based on following criteria: MULTISYSTEM THREATS such as stable sepsis, metabolic/electrolyte imbalance with or without encephalopathy that is responding to early treatment.  May place patient in observation at [facility] or [facility] if equivalent level of care is available:: No  Covid Evaluation: Asymptomatic - no recent exposure (last 10 days) testing not required  Diagnosis:  Volume overload [295621]  Admitting Physician: Jonah Blue [[id number]]  Attending Physician: Jonah Blue [[id number]]          B Medical/Surgery History Past Medical History:  Diagnosis Date   AAA (abdominal aortic aneurysm) 09/2012--  monitored by dr Myra Gianotti   stable 5.6cm CTA abdomen 2016   Abnormal drug screen 07/09/2016   1/2/018 - positive oxycodone, fentanyl, inapprop positive MJ - mod risk   Allergic rhinitis    Ascites 03/2019   B12 deficiency    CAD (coronary artery disease) cardiologist-  dr Jens Som   x3 with stents last 2005, EF 40%, predominantly RCA by CT 2016   Cataracts, bilateral    Cervical spondylosis 05/2010   s/p surgery   Charcot Hilda Lias Tooth muscular atrophy dx  1975   neurologist--  dr love--  type 2 per pt   Chronic pain syndrome    established with [facility] ([place]) --> disagreement and transfered care to Dr Regenia Skeeter at [facility], requests PCP write Rx but f/u with pain clinic Q6-12 months   COPD (chronic obstructive pulmonary disease) 10/2011   minimal by PFTs   DDD (degenerative disc disease)    Disturbances of sensation of smell and taste    improving   Dyspnea on exertion    GERD (gastroesophageal reflux disease)    Gout    Headache    Hepatitis    hepatitis B   Hidradenitis    right groin   Hidradenitis suppurativa dx  2011   goin and leg crease   followd by Roxan Hockey - daily bactrim, s/p intralesional steroid injection 10/2010   Hip osteoarthritis    s/p intraarticular steroid shot (12/2012) (Ibazebo/Caffrey)   History of hepatitis B 1983   History of MI (myocardial infarction)    2000  &  2005   History of pneumonia    History of viral meningitis 2000   HLD (hyperlipidemia)    HTN (hypertension)    Ischemic cardiomyopathy    s/p inferior MI  --  current ef per myoview 39%   Liver cirrhosis secondary to NASH 01/2014   by CT scan, rec virtual colonoscopy by Dr Loreta Ave 06/2014   Lumbar herniated disc    Myocardial  infarction    x2   Nocturia more than twice per night    Obesity    Spinal stenosis    released from NSG.  established with [facility] (07/2013)   T2DM (type 2 diabetes mellitus)    ABIs WNL 2016   Vitamin D deficiency    Past Surgical History:  Procedure Laterality Date   ABDOMINAL AORTIC ENDOVASCULAR FENESTRATED STENT GRAFT N/A 11/30/2015   Procedure: ABDOMINAL AORTIC ENDOVASCULAR FENESTRATED STENT GRAFT;  Surgeon: Nada Libman, MD;  Location: [facility] OR;  Service: Vascular;  Laterality: N/A;   ANTERIOR CERVICAL DECOMP/DISCECTOMY FUSION  01-07-2010    C4 -- C7   CARDIAC CATHETERIZATION  03-30-2005  dr Samule Ohm   ef 40% w/ inferior akinesis/  LM and CFX angiographically normal/  pLAD 30%/   Widely patent stents in RCA and PDA widely patent   CARDIOVASCULAR STRESS TEST  10-23-2012  dr Jens Som   No ischemia/  Moderate scar in the inferior wall, otherwise normal perfusion/  LV ef 39%,  LV wall motion: inferior/ inferolateral hypokinesis   COLONOSCOPY  05/06/2007   normal, small int hemorrhoids rpt 5 yrs due to fmhx - rec against rpt colonoscopy by Dr Loreta Ave   CORONARY ANGIOPLASTY  2000  dr Jens Som   PCI to RCA and PDA   CORONARY ANGIOPLASTY WITH STENT PLACEMENT  03-19-2005  dr Chrissie Noa downey   inferior STEMI--- DES x4 to RCA w/ balloon angioplasty and balloon angioplasty to jailed PDA ostium/  severe hypokinesis of midinferor wall, ef 50%/  dLM 20%,  mLAD 20%,  dCFX 60%   ESOPHAGOGASTRODUODENOSCOPY  01/2017   dilated benign esophageal stenosis, portal hypertensive gastropathy Marina Goodell)   ESOPHAGOGASTRODUODENOSCOPY (EGD) WITH PROPOFOL N/A 02/20/2018   benign biopsy Tobi Bastos, Sharlet Salina, MD)   ESOPHAGOGASTRODUODENOSCOPY (EGD) WITH PROPOFOL N/A 05/12/2019   Procedure: ESOPHAGOGASTRODUODENOSCOPY (EGD) WITH PROPOFOL;  Surgeon: Wyline Mood, MD;  Location: [facility] ENDOSCOPY;  Service: Gastroenterology;  Laterality: N/A;   HYDRADENITIS EXCISION Right 12/31/2014   Procedure: WIDE EXCISION HIDRADENITIS GROIN;  Abigail Miyamoto, MD   IR PARACENTESIS  03/25/2019   LUMBAR DISC SURGERY     L5-S1   LUMBAR LAMINECTOMY  05-18-2010   L2--5   laminectomy/foraminotomy for stenosis ([place])   MYELOGRAM     L5-S1 and L1-2 spondylosis   SACROILIAC JOINT INJECTION Bilateral 10/2013   Sp[place]y   TONSILLECTOMY AND ADENOIDECTOMY  1972     A IV Location/Drains/Wounds Patient Lines/Drains/Airways Status     Active Line/Drains/Airways     Name Placement date Placement time Site Days   Peripheral IV 09/27/22 20 G 1.88" Right;Anterior Forearm 09/27/22  0515  Forearm  1   Wound / Incision (Open or Dehisced) 07/08/22 Other (Comment) Pelvis Anterior Red 07/08/22  0835  Pelvis  82  Wound / Incision (Open or Dehisced) 07/08/22 Other (Comment) Pretibial Right weeping 07/08/22  0836  Pretibial  82   Wound / Incision (Open or Dehisced) 07/08/22 Other (Comment) Pretibial Left red/weeping 07/08/22  0836  Pretibial  82   Wound / Incision (Open or Dehisced) 07/08/22 Other (Comment) Pretibial Left;Proximal large, closed blister 07/08/22  1500  Pretibial  82            Intake/Output Last 24 hours  Intake/Output Summary (Last 24 hours) at 09/28/2022 1255 Last data filed at 09/28/2022 1142 Gross per 24 hour  Intake 600 ml  Output --  Net 600 ml    Labs/Imaging Results for orders placed or performed during the hospital encounter of 09/26/22 (from the past 48 hour(s))  Brain natriuretic peptide     Status: None   Collection Time: 09/26/22  9:47 PM  Result Value Ref Range   B Natriuretic Peptide 61.3 0.0 - 100.0 pg/mL    Comment: Performed at [facility], [address] [place], [place] [address]  Ammonia     Status: Abnormal   Collection Time: 09/27/22 12:05 AM  Result Value Ref Range   Ammonia 70 (H) 9 - 35 umol/L    Comment: Performed at [facility], [address], [place], [place] [address]  CBC with Differential/Platelet     Status: Abnormal   Collection Time: 09/27/22 12:05 AM  Result  Value Ref Range   WBC 9.3 4.0 - 10.5 K/uL   RBC 3.27 (L) 4.22 - 5.81 MIL/uL   Hemoglobin 11.6 (L) 13.0 - 17.0 g/dL   HCT 09.8 (L) 11.9 - 14.7 %   MCV 102.4 (H) 80.0 - 100.0 fL   MCH 35.5 (H) 26.0 - 34.0 pg   MCHC 34.6 30.0 - 36.0 g/dL   RDW 82.9 (H) 56.2 - 13.0 %   Platelets 42 (L) 150 - 400 K/uL    Comment: Immature Platelet Fraction may be clinically indicated, consider ordering this additional test [id number] REPEATED TO VERIFY    nRBC 0.0 0.0 - 0.2 %   Neutrophils Relative % 81 %   Neutro Abs 7.5 1.7 - 7.7 K/uL   Lymphocytes Relative 7 %   Lymphs Abs 0.7 0.7 - 4.0 K/uL   Monocytes Relative 10 %   Monocytes Absolute 0.9 0.1 - 1.0 K/uL   Eosinophils Relative 1 %   Eosinophils Absolute 0.1 0.0 - 0.5 K/uL   Basophils Relative 0 %   Basophils Absolute 0.0 0.0 - 0.1 K/uL   Immature Granulocytes 1 %   Abs Immature Granulocytes 0.11 (H) 0.00 - 0.07 K/uL    Comment: Performed at [facility], [address], [place], [place] [address]  Comprehensive metabolic panel     Status: Abnormal   Collection Time: 09/27/22 12:05 AM  Result Value Ref Range   Sodium 125 (L) 135 - 145 mmol/L   Potassium 3.2 (L) 3.5 - 5.1 mmol/L   Chloride 90 (L) 98 - 111 mmol/L   CO2 26 22 - 32 mmol/L   Glucose, Bld 133 (H) 70 - 99 mg/dL    Comment: Glucose reference range applies only to samples taken after fasting for at least 8 hours.   BUN 34 (H) 8 - 23 mg/dL   Creatinine, Ser 8.41 (H) 0.61 - 1.24 mg/dL   Calcium 8.1 (L) 8.9 - 10.3 mg/dL   Total Protein 8.9 (H) 6.5 - 8.1 g/dL   Albumin 2.2 (L) 3.5 - 5.0 g/dL  AST 55 (H) 15 - 41 U/L   ALT 33 0 - 44 U/L   Alkaline Phosphatase 191 (H) 38 - 126 U/L   Total Bilirubin 3.1 (H) 0.3 - 1.2 mg/dL   GFR, Estimated 53 (L) >60 mL/min    Comment: (NOTE) Calculated using the CKD-EPI Creatinine Equation (2021)    Anion gap 9 5 - 15    Comment: Performed at [facility], [address], [place], [place] [address]  Lipase, blood     Status: None    Collection Time: 09/27/22 12:05 AM  Result Value Ref Range   Lipase 37 11 - 51 U/L    Comment: Performed at [facility], [address] [place], [place] [address]  CBG monitoring, ED     Status: Abnormal   Collection Time: 09/28/22 12:16 AM  Result Value Ref Range   Glucose-Capillary 130 (H) 70 - 99 mg/dL    Comment: Glucose reference range applies only to samples taken after fasting for at least 8 hours.  Basic metabolic panel     Status: Abnormal   Collection Time: 09/28/22  1:55 AM  Result Value Ref Range   Sodium 130 (L) 135 - 145 mmol/L   Potassium 3.3 (L) 3.5 - 5.1 mmol/L   Chloride 92 (L) 98 - 111 mmol/L   CO2 29 22 - 32 mmol/L   Glucose, Bld 111 (H) 70 - 99 mg/dL    Comment: Glucose reference range applies only to samples taken after fasting for at least 8 hours.   BUN 33 (H) 8 - 23 mg/dL   Creatinine, Ser 8.29 0.61 - 1.24 mg/dL   Calcium 8.1 (L) 8.9 - 10.3 mg/dL   GFR, Estimated >56 >21 mL/min    Comment: (NOTE) Calculated using the CKD-EPI Creatinine Equation (2021)    Anion gap 9 5 - 15    Comment: Performed at [facility], [address], [place], [place] [address]  CBC     Status: Abnormal   Collection Time: 09/28/22  1:55 AM  Result Value Ref Range   WBC 7.3 4.0 - 10.5 K/uL   RBC 3.11 (L) 4.22 - 5.81 MIL/uL   Hemoglobin 11.3 (L) 13.0 - 17.0 g/dL   HCT 78.4 (L) 69.6 - 29.5 %   MCV 102.6 (H) 80.0 - 100.0 fL   MCH 36.3 (H) 26.0 - 34.0 pg   MCHC 35.4 30.0 - 36.0 g/dL   RDW 28.4 (H) 13.2 - 44.0 %   Platelets 41 (L) 150 - 400 K/uL    Comment: Immature Platelet Fraction may be clinically indicated, consider ordering this additional test [id number] REPEATED TO VERIFY    nRBC 0.0 0.0 - 0.2 %    Comment: Performed at [facility], [address], [place], [place] [address]  Brain natriuretic peptide     Status: Abnormal   Collection Time: 09/28/22  1:55 AM  Result Value Ref Range   B Natriuretic Peptide 102.5 (H) 0.0 - 100.0 pg/mL    Comment:  Performed at [facility], [address] [place], [place] [address]   *Note: Due to a large number of results and/or encounters for the requested time period, some results have not been displayed. A complete set of results can be found in Results Review.   CT HEAD WO CONTRAST ( )  Result Date: 09/27/2022 CLINICAL DATA:  Follow-up subdural hematoma EXAM: CT HEAD WITHOUT CONTRAST TECHNIQUE: Contiguous axial images were obtained from the base  of the skull through the vertex without intravenous contrast. RADIATION DOSE REDUCTION: This exam was performed according to the departmental dose-optimization program which includes automated exposure control, adjustment of the mA and/or kV according to patient size and/or use of iterative reconstruction technique. COMPARISON:  09/23/2022 FINDINGS: Brain: Again noted is the left para falcine subdural hematoma. This measures 5 mm in thickness compared to 6 mm previously. Overall, the size of the subdural hematoma appears to have decreased. Subdural hematoma along the posterior falx measures 4 mm, stable since prior study. No new areas of hemorrhage. No infarct or hydrocephalus. Mild cerebral atrophy. Vascular: No hyperdense vessel or unexpected calcification. Skull: No acute calvarial abnormality. Sinuses/Orbits: No acute findings Other: None IMPRESSION: Left parafalcine subdural hematoma again noted. The superior component appears to have slightly decreased in size wall the posterior component is stable. No new areas of hemorrhage. Electronically Signed   By: Charlett Nose M.D.   On: 09/27/2022 18:19   CT Angio Chest PE W and/or Wo Contrast  Result Date: 09/27/2022 CLINICAL DATA:  Shortness of breath. EXAM: CT ANGIOGRAPHY CHEST WITH CONTRAST TECHNIQUE: Multidetector CT imaging of the chest was performed using the standard protocol during bolus administration of intravenous contrast. Multiplanar CT image reconstructions and MIPs were obtained to evaluate the  vascular anatomy. RADIATION DOSE REDUCTION: This exam was performed according to the departmental dose-optimization program which includes automated exposure control, adjustment of the mA and/or kV according to patient size and/or use of iterative reconstruction technique. CONTRAST:  75mL OMNIPAQUE IOHEXOL 350 MG/ML SOLN COMPARISON:  September 22, 2022 FINDINGS: Cardiovascular: A single lead ventricular pacer is noted. There is marked severity calcification of the aortic arch, without evidence of aortic aneurysm. Satisfactory opacification of the pulmonary arteries to the segmental level. No evidence of pulmonary embolism. There is mild cardiomegaly with marked severity coronary artery calcification. No pericardial effusion. Mediastinum/Nodes: No enlarged mediastinal, hilar, or axillary lymph nodes. Thyroid gland, trachea, and esophagus demonstrate no significant findings. Lungs/Pleura: Mild atelectasis is seen along the posterior aspects of the right upper lobe, right middle lobe and right lower lobe. No pleural effusion or pneumothorax is identified. Upper Abdomen: The liver is cirrhotic in appearance. The spleen is markedly enlarged. Multiple punctate parenchymal calcifications are seen within the head of the pancreas. There is stenting of the visualized portion of the suprarenal and infrarenal abdominal aorta. Bilateral renal artery stents are also noted. Musculoskeletal: Chronic appearing lateral ninth and posterior tenth and eleventh right rib fractures are seen. A metallic density fixation plate and screws are seen along the anterior aspect of the lower cervical spine. Multilevel degenerative changes seen throughout the thoracic spine. Review of the MIP images confirms the above findings. IMPRESSION: 1. No evidence of pulmonary embolism. 2. Mild posterior right upper lobe, right middle lobe and right lower lobe atelectasis. 3. Mild cardiomegaly with marked severity coronary artery calcification. 4. Cirrhotic  liver with marked splenomegaly. 5. Abdominal aortic stenting. 6. Chronic appearing lateral ninth and posterior tenth and eleventh right rib fractures. 7. Aortic atherosclerosis. Aortic Atherosclerosis (ICD10-I70.0). Electronically Signed   By: Aram Candela M.D.   On: 09/27/2022 01:13   DG Chest Port 1 View  Result Date: 09/26/2022 CLINICAL DATA:  Shortness of breath upon exertion. EXAM: PORTABLE CHEST 1 VIEW COMPARISON:  09/22/2022. FINDINGS: The heart is enlarged and the mediastinal contour is stable. Rightward shift of the heart and mediastinal structures is unchanged. The pulmonary vasculature is distended. No consolidation, effusion, or pneumothorax. A single lead pacemaker device  is present over the left chest. Cervical spinal fusion hardware and abdominal aortic endograft are noted. No acute osseous abnormality. IMPRESSION: Cardiomegaly with pulmonary vascular congestion. Electronically Signed   By: Thornell Sartorius M.D.   On: 09/26/2022 22:34    Pending Labs Unresulted Labs (From admission, onward)     Start     Ordered   09/29/22 0500  CBC  Tomorrow morning,   R       Question:  Specimen collection method  Answer:  Lab=Lab collect   09/28/22 1050   09/29/22 0500  Basic metabolic panel  Tomorrow morning,   R       Question:  Specimen collection method  Answer:  Lab=Lab collect   09/28/22 1050   09/28/22 1105  Ammonia  Once,   R       Question:  Specimen collection method  Answer:  Lab=Lab collect   09/28/22 1104   09/28/22 1101  Vitamin B12  Once,   R       Question:  Specimen collection method  Answer:  Lab=Lab collect   09/28/22 1100   09/28/22 1101  Folate  Once,   R       Question:  Specimen collection method  Answer:  Lab=Lab collect   09/28/22 1100   09/26/22 2147  CBC with Differential  (ED Shortness of Breath)  Once,   STAT        09/26/22 2147            Vitals/Pain Today's Vitals   09/28/22 1015 09/28/22 1045 09/28/22 1130 09/28/22 1143  BP: (!) 123/54 (!)  108/50 131/66   Pulse: 87 85 86   Resp: 13 17 18    Temp:    98 F (36.7 C)  TempSrc:    Oral  SpO2: 100% 94% 94%   Weight:      Height:      PainSc:        Isolation Precautions No active isolations  Medications Medications  fentaNYL (DURAGESIC) 75 MCG/HR 1 patch (1 patch Transdermal Not Given 09/27/22 1203)  oxyCODONE (Oxy IR/ROXICODONE) immediate release tablet 5 mg (5 mg Oral Given 09/28/22 0155)  rifaximin (XIFAXAN) tablet 550 mg (550 mg Oral Given 09/28/22 1132)  nicotine (NICODERM CQ - dosed in mg/24 hours) patch 14 mg (14 mg Transdermal Patch Applied 09/28/22 1134)  lactulose (CHRONULAC) 10 GM/15ML solution 20 g (20 g Oral Given 09/28/22 1130)  pantoprazole (PROTONIX) EC tablet 40 mg (40 mg Oral Given 09/28/22 1132)  gabapentin (NEURONTIN) capsule 300 mg (300 mg Oral Given 09/28/22 1131)  albuterol (PROVENTIL) (2.5 MG/3ML) 0.083% nebulizer solution 2.5 mg (has no administration in time range)  umeclidinium bromide (INCRUSE ELLIPTA) 62.5 MCG/ACT 1 puff (1 puff Inhalation Given 09/28/22 1130)  sodium chloride flush (NS) 0.9 % injection 3 mL (3 mLs Intravenous Not Given 09/28/22 0906)  acetaminophen (TYLENOL) tablet 650 mg (650 mg Oral Given 09/27/22 1407)    Or  acetaminophen (TYLENOL) suppository 650 mg ( Rectal See Alternative 09/27/22 1407)  docusate sodium (COLACE) capsule 100 mg (100 mg Oral Given 09/28/22 1131)  polyethylene glycol (MIRALAX / GLYCOLAX) packet 17 g (has no administration in time range)  bisacodyl (DULCOLAX) EC tablet 5 mg (has no administration in time range)  ondansetron (ZOFRAN) tablet 4 mg (has no administration in time range)    Or  ondansetron (ZOFRAN) injection 4 mg (has no administration in time range)  hydrALAZINE (APRESOLINE) injection 5 mg (has no administration in time range)  doxycycline (  VIBRA-TABS) tablet 100 mg (100 mg Oral Given 09/28/22 1131)  furosemide (LASIX) injection 20 mg (20 mg Intravenous Given 09/28/22 1140)  ipratropium-albuterol (DUONEB)  0.5-2.5 (3) MG/3ML nebulizer solution 3 mL (3 mLs Nebulization Given 09/27/22 0123)  iohexol (OMNIPAQUE) 350 MG/ML injection 75 mL (75 mLs Intravenous Contrast Given 09/27/22 0052)  sodium chloride 0.9 % bolus 500 mL (0 mLs Intravenous Stopped 09/27/22 0530)  lactulose (CHRONULAC) 10 GM/15ML solution 10 g (10 g Oral Given 09/27/22 0410)  potassium chloride SA (KLOR-CON M) CR tablet 40 mEq (40 mEq Oral Given 09/28/22 0646)    Mobility non-ambulatory     Focused Assessments    R Recommendations: See Admitting Provider Note  Report given to:   Additional Notes:  bedbound pt

## 2022-09-28 NOTE — Progress Notes (Signed)
  Echocardiogram 2D Echocardiogram has been performed.  Delcie Roch 09/28/2022, 5:32 PM

## 2022-09-28 NOTE — Care Management Obs Status (Signed)
MEDICARE OBSERVATION STATUS NOTIFICATION   Patient Details  Name: [name] MRN: [id number] Date of Birth: Jul 10, 1955   Medicare Observation Status Notification Given:  Yes    Oletta Cohn, RN 09/28/2022, 8:30 AM

## 2022-09-28 NOTE — Progress Notes (Signed)
  Transition of Care [facility]) Screening Note   Patient Details  Name: EMRYS MCKAMIE Date of Birth: 11/17/1955   Transition of Care [facility]) CM/SW Contact:    Mearl Latin, LCSW Phone Number: 09/28/2022, 3:22 PM    Transition of Care Department [facility]) has reviewed patient from [facility] long term [facility]. We will continue to monitor patient advancement through interdisciplinary progression rounds. If new patient transition needs arise, please place a TOC consult.
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
## 2022-09-28 NOTE — Plan of Care (Signed)

## 2022-09-29 ENCOUNTER — Inpatient Hospital Stay (HOSPITAL_COMMUNITY): Payer: Medicare HMO

## 2022-09-29 DIAGNOSIS — R0609 Other forms of dyspnea: Secondary | ICD-10-CM | POA: Diagnosis not present

## 2022-09-29 LAB — COMPREHENSIVE METABOLIC PANEL
ALT: 30 U/L (ref 0–44)
AST: 49 U/L — ABNORMAL HIGH (ref 15–41)
Albumin: 2 g/dL — ABNORMAL LOW (ref 3.5–5.0)
Alkaline Phosphatase: 148 U/L — ABNORMAL HIGH (ref 38–126)
Anion gap: 9 (ref 5–15)
BUN: 21 mg/dL (ref 8–23)
CO2: 29 mmol/L (ref 22–32)
Calcium: 8.5 mg/dL — ABNORMAL LOW (ref 8.9–10.3)
Chloride: 94 mmol/L — ABNORMAL LOW (ref 98–111)
Creatinine, Ser: 1.05 mg/dL (ref 0.61–1.24)
GFR, Estimated: >60 mL/min (ref >60)
Glucose, Bld: 129 mg/dL — ABNORMAL HIGH (ref 70–99)
Potassium: 4.2 mmol/L (ref 3.5–5.1)
Sodium: 132 mmol/L — ABNORMAL LOW (ref 135–145)
Total Bilirubin: 3 mg/dL — ABNORMAL HIGH (ref 0.3–1.2)
Total Protein: 7.6 g/dL (ref 6.5–8.1)

## 2022-09-29 LAB — CBC
HCT: 31.9 % — ABNORMAL LOW (ref 39.0–52.0)
Hemoglobin: 10.7 g/dL — ABNORMAL LOW (ref 13.0–17.0)
MCH: 35.5 pg — ABNORMAL HIGH (ref 26.0–34.0)
MCHC: 33.5 g/dL (ref 30.0–36.0)
MCV: 106 fL — ABNORMAL HIGH (ref 80.0–100.0)
Platelets: 38 10*3/uL — ABNORMAL LOW (ref 150–400)
RBC: 3.01 MIL/uL — ABNORMAL LOW (ref 4.22–5.81)
RDW: 16.2 % — ABNORMAL HIGH (ref 11.5–15.5)
WBC: 7.7 10*3/uL (ref 4.0–10.5)
nRBC: 0 % (ref 0.0–0.2)

## 2022-09-29 LAB — MAGNESIUM: Magnesium: 2 mg/dL (ref 1.7–2.4)

## 2022-09-29 LAB — AMMONIA: Ammonia: 74 umol/L — ABNORMAL HIGH (ref 9–35)

## 2022-09-29 LAB — BRAIN NATRIURETIC PEPTIDE: B Natriuretic Peptide: 99.1 pg/mL (ref 0.0–100.0)

## 2022-09-29 MED ORDER — ADULT MULTIVITAMIN W/MINERALS CH
1.0000 | ORAL_TABLET | Freq: Every day | ORAL | Status: DC
  Administered 2022-09-29 – 2022-10-03 (×5): 1 via ORAL
  Filled 2022-09-29 (×5): qty 1

## 2022-09-29 MED ORDER — FUROSEMIDE 10 MG/ML IJ SOLN
40.0000 mg | Freq: Once | INTRAMUSCULAR | Status: AC
  Administered 2022-09-29: 40 mg via INTRAVENOUS
  Filled 2022-09-29: qty 4

## 2022-09-29 MED ORDER — NYSTATIN 100000 UNIT/GM EX POWD
Freq: Three times a day (TID) | CUTANEOUS | Status: DC
  Filled 2022-09-29: qty 15

## 2022-09-29 MED ORDER — PROSOURCE PLUS PO LIQD
30.0000 mL | Freq: Two times a day (BID) | ORAL | Status: DC
  Administered 2022-09-29 – 2022-10-03 (×4): 30 mL via ORAL
  Filled 2022-09-29 (×4): qty 30

## 2022-09-29 MED ORDER — LACTULOSE 10 GM/15ML PO SOLN
30.0000 g | Freq: Three times a day (TID) | ORAL | Status: DC
  Administered 2022-09-29 – 2022-10-03 (×11): 30 g via ORAL
  Filled 2022-09-29 (×14): qty 60

## 2022-09-29 MED ORDER — METOPROLOL TARTRATE 25 MG PO TABS
25.0000 mg | ORAL_TABLET | Freq: Two times a day (BID) | ORAL | Status: DC
  Administered 2022-09-29 – 2022-10-03 (×5): 25 mg via ORAL
  Filled 2022-09-29 (×8): qty 1

## 2022-09-29 MED ORDER — CLOPIDOGREL BISULFATE 75 MG PO TABS: 75.0000 mg | ORAL_TABLET | Freq: Every day | ORAL | Status: DC

## 2022-09-29 NOTE — [place] FL2 (Signed)
[place] MEDICAID FL2 LEVEL OF CARE FORM     IDENTIFICATION  Patient Name: [name] Birthdate: 1956/04/28 Sex: male Admission Date (Current Location): 09/26/2022  [facility] and [place] Number:  Producer, television/film/video and Address:  [facility], [address] [place], [place] [address]      Provider Number: [id number]  Attending Physician Name and Address:  Leroy Sea, MD  Relative Name and Phone Number:       Current Level of Care: Hospital Recommended Level of Care: Skilled Nursing Facility Prior Approval Number:    Date Approved/Denied:   PASRR Number: [id number]  Discharge Plan: SNF    Current Diagnoses: Patient Active Problem List   Diagnosis Date Noted   Volume overload 09/28/2022   Chronic hyponatremia 09/27/2022   Chronic kidney disease, stage 3a (HCC) 09/27/2022   SDH (subdural hematoma) (HCC) 09/23/2022   Subdural hematoma (HCC) 09/22/2022   Encephalopathy 07/08/2022   Portal hypertension (HCC) 07/08/2022   Closed dislocation of right hip (HCC) 07/08/2022   Falls 07/08/2022   Bilateral cellulitis of lower leg 06/07/2022   COVID-19 virus infection 05/04/2022   Hepatic encephalopathy (HCC) 04/15/2022   Hospice care patient 02/01/2022   Cellulitis of perineum    Scrotal swelling    Scrotal pain    Edema    Chronic combined systolic and diastolic heart failure (HCC) 01/31/2022   Cellulitis, scrotum 01/31/2022   Venous stasis ulcer of left lower leg with edema of left lower leg (HCC) 01/06/2022   Lymphadenopathy of head and neck region 12/15/2021   FTT (failure to thrive) in adult 11/16/2021   Driving safety issue 65/78/4696   Decreased visual acuity 10/06/2021   Cognitive safety issue 06/27/2021   Status post right hip replacement 12/04/2020   Carotid stenosis, asymptomatic, bilateral 08/28/2020   Statin intolerance 08/05/2020   Mass of both parotid glands 08/05/2020   Memory deficit 01/10/2020   Fall at home,  initial encounter 02/04/2019   Pancytopenia (HCC) 09/18/2018   Hearing loss 08/07/2018   Abdominal aortic aneurysm (AAA) without rupture (HCC) 01/02/2018   History of MRSA infection 01/02/2018   History of myocardial infarction 01/02/2018   Aortic atherosclerosis (HCC) 01/01/2018   MDD (major depressive disorder), recurrent severe, without psychosis (HCC) 11/17/2017   Candidal intertrigo 05/08/2017   Falls frequently 03/13/2017   Abnormal drug screen 07/09/2016   Thrombocytopenia (HCC) 04/29/2016   Anemia of chronic disease 04/29/2016   Protein-calorie malnutrition (HCC) 04/29/2016   RUQ abdominal pain 04/26/2016   Encounter for chronic pain management 01/04/2016   Preop cardiovascular exam 09/01/2014   Generalized weakness 07/22/2014   Esophageal dysphagia 07/22/2014   Gross hematuria 05/19/2014   Encounter for general adult medical examination with abnormal findings 04/21/2014   Advanced care planning/counseling discussion 04/21/2014   Orthostatic hypotension 03/13/2014   Cirrhosis of liver not due to alcohol (HCC) 01/03/2014   Osteoarthritis of right hip 08/16/2012   Medicare annual wellness visit, subsequent 06/07/2012   Chronic dyspnea 10/06/2011   DDD (degenerative disc disease), cervical    Vitamin D deficiency 03/14/2011   Vitamin B12 deficiency 08/05/2010   Prediabetes 06/27/2010   Cervical spondylosis 05/05/2010   Obesity, morbid, BMI 40.0-49.9 (HCC) 05/03/2010   Hidradenitis 05/03/2010   Smoker 10/21/2009   HLD (hyperlipidemia) 09/07/2009   Gout 09/07/2009   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   Essential hypertension 09/07/2009   CAD (coronary artery disease) 09/07/2009   CARDIOMYOPATHY 09/07/2009   COPD (chronic obstructive pulmonary disease) (HCC) 09/07/2009  Chronic pain syndrome 09/07/2009    Orientation RESPIRATION BLADDER Height & Weight     Self, Time, Situation, Place  O2 (2L nasal cannula) Continent Weight: 280 lb (127 kg) Height:  5\' 10"   (177.8 cm)  BEHAVIORAL SYMPTOMS/MOOD NEUROLOGICAL BOWEL NUTRITION STATUS      Continent Diet (See dc summary)  AMBULATORY STATUS COMMUNICATION OF NEEDS Skin   Extensive Assist Verbally Normal                       Personal Care Assistance Level of Assistance  Bathing, Feeding, Dressing Bathing Assistance: Maximum assistance Feeding assistance: Limited assistance Dressing Assistance: Maximum assistance     Functional Limitations Info             SPECIAL CARE FACTORS FREQUENCY  PT (By licensed PT), OT (By licensed OT)     PT Frequency: 2x/week OT Frequency: 2x/week            Contractures Contractures Info: Not present    Additional Factors Info  Code Status, Allergies, Insulin Sliding Scale Code Status Info: DNR Allergies Info: Statins, Losartan, Wellbutrin (Bupropion), Allopurinol, Baclofen, Penicillins, Tramadol   Insulin Sliding Scale Info: See dc summary       Current Medications (09/29/2022):  This is the current hospital active medication list Current Facility-Administered Medications  Medication Dose Route Frequency Provider Last Rate Last Admin   (feeding supplement) PROSource Plus liquid 30 mL  30 mL Oral BID BM Leroy Sea, MD   30 mL at 09/29/22 1122   acetaminophen (TYLENOL) tablet 650 mg  650 mg Oral Q6H PRN Jonah Blue, MD   650 mg at 09/27/22 1407   Or   acetaminophen (TYLENOL) suppository 650 mg  650 mg Rectal Q6H PRN Jonah Blue, MD       albuterol (PROVENTIL) (2.5 MG/3ML) 0.083% nebulizer solution 2.5 mg  2.5 mg Nebulization Q6H PRN Jonah Blue, MD       bisacodyl (DULCOLAX) EC tablet 5 mg  5 mg Oral Daily PRN Jonah Blue, MD       [START ON 10/08/2022] clopidogrel (PLAVIX) tablet 75 mg  75 mg Oral Daily Leroy Sea, MD       docusate sodium (COLACE) capsule 100 mg  100 mg Oral BID Jonah Blue, MD   100 mg at 09/28/22 2112   fentaNYL (DURAGESIC) 75 MCG/HR 1 patch  1 patch Transdermal Vickie Epley, MD        furosemide (LASIX) injection 20 mg  20 mg Intravenous Daily Rai, Ripudeep K, MD   20 mg at 09/29/22 0850   gabapentin (NEURONTIN) capsule 300 mg  300 mg Oral BID Jonah Blue, MD   300 mg at 09/29/22 0849   hydrALAZINE (APRESOLINE) injection 5 mg  5 mg Intravenous Q4H PRN Jonah Blue, MD       lactulose (CHRONULAC) 10 GM/15ML solution 30 g  30 g Oral TID Leroy Sea, MD   30 g at 09/29/22 0727   metoprolol tartrate (LOPRESSOR) tablet 25 mg  25 mg Oral BID Leroy Sea, MD   25 mg at 09/29/22 1125   multivitamin with minerals tablet 1 tablet  1 tablet Oral Daily Leroy Sea, MD       nicotine (NICODERM CQ - dosed in mg/24 hours) patch 14 mg  14 mg Transdermal Daily Jonah Blue, MD   14 mg at 09/29/22 1610   nystatin (MYCOSTATIN/NYSTOP) topical powder   Topical TID Leroy Sea, MD  Given at 09/29/22 1127   ondansetron (ZOFRAN) tablet 4 mg  4 mg Oral Q6H PRN Jonah Blue, MD       Or   ondansetron [facility]) injection 4 mg  4 mg Intravenous Q6H PRN Jonah Blue, MD       oxyCODONE (Oxy IR/ROXICODONE) immediate release tablet 5 mg  5 mg Oral Q8H PRN Jonah Blue, MD   5 mg at 09/29/22 0921   pantoprazole (PROTONIX) EC tablet 40 mg  40 mg Oral Daily Jonah Blue, MD   40 mg at 09/29/22 0848   polyethylene glycol (MIRALAX / GLYCOLAX) packet 17 g  17 g Oral Daily PRN Jonah Blue, MD       rifaximin [facility]) tablet 550 mg  550 mg Oral BID Jonah Blue, MD   550 mg at 09/29/22 0849   sodium chloride flush (NS) 0.9 % injection 3 mL  3 mL Intravenous Steva Colder, MD   3 mL at 09/29/22 0855   umeclidinium bromide (INCRUSE ELLIPTA) 62.5 MCG/ACT 1 puff  1 puff Inhalation Daily Jonah Blue, MD   1 puff at 09/29/22 1123     Discharge Medications: Please see discharge summary for a list of discharge medications.  Relevant Imaging Results:  Relevant Lab Results:   Additional Information SS#: [id number]  Mearl Latin, LCSW

## 2022-09-29 NOTE — Progress Notes (Signed)
Initial Nutrition Assessment  DOCUMENTATION CODES:   Morbid obesity  INTERVENTION:  - Add MVI   - Continue Prosource BID.   NUTRITION DIAGNOSIS:   Increased nutrient needs related to chronic illness as evidenced by estimated needs.  GOAL:   Patient will meet greater than or equal to 90% of their needs  MONITOR:   PO intake, Supplement acceptance  REASON FOR ASSESSMENT:   Consult Assessment of nutrition requirement/status  ASSESSMENT:   67 y.o. male admits related to SOB. PMH includes: NASH cirrhosis, COPD, CAD, HTN, AAA, CHF, DM. Pt is currently receiving medical management related to dyspnea.  Meds reviewed:  colace, lasix. Labs reviewed: Na low.   RD attempted to call pt's room but no answer. Wts fluctuate per record, so difficult to determine true wt loss. Per record, pt ate 100% of his lunch today. Prosource bID was added by MD. RD will continue to monitor PO intakes.   NUTRITION - FOCUSED PHYSICAL EXAM:  RD working remotely- attempt at follow up.  Diet Order:   Diet Order             Diet Heart Room service appropriate? Yes; Fluid consistency: Thin; Fluid restriction: 1800 mL Fluid  Diet effective now                   EDUCATION NEEDS:   Not appropriate for education at this time  Skin:  Skin Assessment: Reviewed RN Assessment  Last BM:  4/25 - type 6  Height:   Ht Readings from Last 1 Encounters:  09/28/22 5\' 10"  (1.778 m)    Weight:   Wt Readings from Last 1 Encounters:  09/28/22 127 kg    Ideal Body Weight:     BMI:  Body mass index is 40.18 kg/m.  Estimated Nutritional Needs:   Kcal:  1775-2030 kcals  Protein:  85- 100 gm  Fluid:  >/= 1.7 L  Bethann Humble, RD, LDN, CNSC.

## 2022-09-29 NOTE — Progress Notes (Signed)
PROGRESS NOTE                                                                                                                                                                                                             Patient Demographics:    [name], is a 67 y.o. male, DOB - 1955-10-11, ZOX:[id number]  Outpatient Primary MD for the patient is [name], MD    LOS - 1  Admit date - 09/26/2022    Chief Complaint  Patient presents with   Shortness of Breath       Brief Narrative (HPI from H&P)     67 year old male with history of Charcot-Marie-Tooth disease, NASH cirrhosis, COPD, CAD, HTN, AAA, chronic combined CHF, and DM presented with shortness of breath. He was last admitted from 4/19-21 with a fall at [facility]  He was found to have AKI, a traumatic SDH, and scrotal cellulitis; Plavix was to be held for 2 weeks post-bleed.  He reports that since he returned to his facility he has been feeling very fatigued and SOB.  He has LE edema and stasis changes.  In the ER he was found to be profoundly dehydrated, hypotensive however had evidence of third spacing as well he received IV fluids for blood pressure support and was admitted for further care.   Subjective:    [name] today has, No headache, No chest pain, No abdominal pain - No Nausea, No new weakness tingling or numbness, no SOB, chronic RLL weakness   Assessment  & Plan :   Acute on chronic dyspnea with generalized weakness, due to acute on chronic combined diastolic and systolic CHF of now 45% improved somewhat from before.  Currently not on any O2, chest x-ray with cardiomegaly and pulmonary vascular congestion.  CT chest unremarkable, no PE, mild posterior right upper lobe, middle lobe and lower lobe atelectasis.  Been appropriately started on diuretics to which she has responded very well, repeat echo noted which shows mild improvement in EF as  compared to the last echo in 2020.  Continue diuretics as tolerated by his blood pressure, added protein supplementation to augment his blood pressures and to reduce third spacing.  For now low-dose beta-blocker, if blood pressure holds will entertain Aldactone, Entresto, ACE/ARB.    Chronic combined systolic and diastolic heart failure kindly see #1 above.  Acute on chronic hyponatremia  - Likely related to cirrhosis, liver disease, hypervolemia, improving with diuresis.  Recent fall at SNF with Subdural hematoma  -  Most likely traumatic from fall at SNF, CT head here stable, placed to resume Plavix, 2 weeks of hold is over.  Will resume Plavix from 5/52024.   Cellulitis of scrotum - was diagnosed last admission finishing his oral doxycycline.   CAD: Currently stable, Plavix once stable from subdural standpoint, allergic to statin, aspirin discontinued last admission, low-dose beta-blocker added will monitor blood pressure, diuretics for now.  No acute issues.  Hyperlipidemia: Intolerant to statin, continue low-cholesterol diet.  Cirrhosis of liver, unspecified cause - Portal hypertension with splenomegaly, thrombocytopenia and esophageal varices, with some hepatic encephalopathy and elevated ammonia. - Continue diuretics and rifaximin along with lactulose.  Level improving.  Post discharge outpatient follow-up with GI.   COPD  - Currently stable.  Continue current inhaled regimen.   Anemia of chronic disease, Macrocytosis -  Possibly from chronic illnesses.  Hemoglobin stable.  Monitor intermittently   Thrombocytopenia  - CT from cirrhosis of liver and splenomegaly,     Fall at SNF  - Possibly mechanical.  Fall precautions.  PT eval.  TOC consult, PT/OT to be arranged at SNF.   Tobacco abuse  -Currently on nicotine patch.  Counseled to quit.   Chronic pain  - Continue gabapentin, fentanyl patch and oxycodone as needed   Morbid obesity with BMI of 40 -  follow-up with PCP         Condition - Extremely Guarded  Family Communication  :  called sister [name] [phone]  09/29/2022 at 10:20 AM and message left  Code Status :  DNR  Consults  :  None  PUD Prophylaxis : PPI   Procedures  :     CT - 1. No evidence of pulmonary embolism. 2. Mild posterior right upper lobe, right middle lobe and right lower lobe atelectasis. 3. Mild cardiomegaly with marked severity coronary artery calcification. 4. Cirrhotic liver with marked splenomegaly. 5. Abdominal aortic stenting. 6. Chronic appearing lateral ninth and posterior tenth and eleventh right rib fractures. 7. Aortic atherosclerosis. Aortic Atherosclerosis   TTE -  1. Left ventricular ejection fraction, by estimation, is 45 to 50%. The left ventricle has mildly decreased function. The left ventricle demonstrates global hypokinesis. The left ventricular internal cavity size was moderately dilated. There is mild left ventricular hypertrophy of the basal-septal segment. Left ventricular diastolic parameters are consistent with Grade I diastolic dysfunction (impaired relaxation).  2. Right ventricular systolic function is normal. The right ventricular size is normal. Tricuspid regurgitation signal is inadequate for assessing PA pressure.  3. The mitral valve is normal in structure. No evidence of mitral valve regurgitation. No evidence of mitral stenosis.  4. The aortic valve is normal in structure. Aortic valve regurgitation is not visualized. No aortic stenosis is present.  5. The inferior vena cava is normal in size with greater than 50% respiratory variability, suggesting right atrial pressure of 3 mmHg.      Disposition Plan  :    Status is: Inpatient   DVT Prophylaxis  :    SCDs Start: 09/27/22 1019    Lab Results  Component Value Date   PLT 38 (L) 09/29/2022    Diet :  Diet Order             Diet Heart Room service appropriate? Yes; Fluid consistency: Thin; Fluid restriction: 1800 mL Fluid  Diet effective  now                    Inpatient Medications  Scheduled Meds:  docusate sodium  100 mg Oral BID   fentaNYL  1 patch Transdermal Q72H   furosemide  20 mg Intravenous Daily   gabapentin  300 mg Oral BID   lactulose  30 g Oral TID   nicotine  14 mg Transdermal Daily   nystatin   Topical TID   pantoprazole  40 mg Oral Daily   rifaximin  550 mg Oral BID   sodium chloride flush  3 mL Intravenous Q12H   umeclidinium bromide  1 puff Inhalation Daily   Continuous Infusions: PRN Meds:.acetaminophen **OR** acetaminophen, albuterol, bisacodyl, hydrALAZINE, ondansetron **OR** ondansetron (ZOFRAN) IV, oxyCODONE, polyethylene glycol    Objective:   Vitals:   09/29/22 0512 09/29/22 0800 09/29/22 0838 09/29/22 0907  BP: 124/62 (!) 111/45    Pulse: 85 86    Resp: 17 12    Temp: 97.9 F (36.6 C)  98.6 F (37 C) 98.1 F (36.7 C)  TempSrc: Oral  Oral Oral  SpO2: 92% (!) 85%    Weight:      Height:        Wt Readings from Last 3 Encounters:  09/28/22 127 kg  09/24/22 131.6 kg  07/08/22 (!) 143.8 kg     Intake/Output Summary (Last 24 hours) at 09/29/2022 1020 Last data filed at 09/29/2022 0900 Gross per 24 hour  Intake 840 ml  Output 850 ml  Net -10 ml     Physical Exam  Awake Alert, No new F.N deficits, chronic right lower extremity weakness .AT,PERRAL Supple Neck, No JVD,   Symmetrical Chest wall movement, Good air movement bilaterally, CTAB RRR,No Gallops,Rubs or new Murmurs,  +ve B.Sounds, Abd Soft, No tenderness,   No Cyanosis, 1+ bilateral lower extremity edema      Data Review:    Recent Labs  Lab 09/22/22 1723 09/22/22 1752 09/24/22 0534 09/27/22 0005 09/28/22 0155 09/29/22 0548  WBC 6.8  --  5.8 9.3 7.3 7.7  HGB 11.1* 11.9* 11.5* 11.6* 11.3* 10.7*  HCT 32.6* 35.0* 34.5* 33.5* 31.9* 31.9*  PLT 50*  --  44* 42* 41* 38*  MCV 104.2*  --  105.2* 102.4* 102.6* 106.0*  MCH 35.5*  --  35.1* 35.5* 36.3* 35.5*  MCHC 34.0  --  33.3 34.6 35.4 33.5  RDW  16.6*  --  16.5* 16.2* 16.5* 16.2*  LYMPHSABS  --   --  0.8 0.7  --   --   MONOABS  --   --  0.6 0.9  --   --   EOSABS  --   --  0.2 0.1  --   --   BASOSABS  --   --  0.0 0.0  --   --     Recent Labs  Lab 09/22/22 1723 09/22/22 1752 09/24/22 0350 09/26/22 2147 09/27/22 0005 09/28/22 0155 09/28/22 1630 09/29/22 0548  NA 132* 134* 131*  --  125* 130*  --  132*  K 3.7 4.1 3.3*  --  3.2* 3.3*  --  4.2  CL 93* 92* 91*  --  90* 92*  --  94*  CO2 29  --  29  --  26 29  --  29  ANIONGAP 10  --  11  --  9 9  --  9  GLUCOSE 135* 132* 98  --  133* 111*  --  129*  BUN 30* 41* 30*  --  34* 33*  --  21  CREATININE 1.60* 1.70* 1.20  --  1.45* 1.20  --  1.05  AST 46*  --  43*  --  55*  --   --  49*  ALT 23  --  25  --  33  --   --  30  ALKPHOS 165*  --  131*  --  191*  --   --  148*  BILITOT 2.2*  --  2.3*  --  3.1*  --   --  3.0*  ALBUMIN 2.2*  --  2.0*  --  2.2*  --   --  2.0*  LATICACIDVEN 2.6*  --   --   --   --   --   --   --   INR 1.4*  --   --   --   --   --   --   --   AMMONIA  --   --   --   --  70*  --  120* 74*  BNP  --   --   --  61.3  --  102.5*  --  99.1  MG  --   --  2.0  --   --   --   --  2.0  CALCIUM 8.3*  --  8.6*  --  8.1* 8.1*  --  8.5*      Recent Labs  Lab 09/22/22 1723 09/24/22 0350 09/26/22 2147 09/27/22 0005 09/28/22 0155 09/28/22 1630 09/29/22 0548  LATICACIDVEN 2.6*  --   --   --   --   --   --   INR 1.4*  --   --   --   --   --   --   AMMONIA  --   --   --  70*  --  120* 74*  BNP  --   --  61.3  --  102.5*  --  99.1  MG  --  2.0  --   --   --   --  2.0  CALCIUM 8.3* 8.6*  --  8.1* 8.1*  --  8.5*    Lab Results  Component Value Date   HGBA1C 5.3 01/31/2022      Radiology Reports DG Chest Port 1 View  Result Date: 09/29/2022 CLINICAL DATA:  Shortness of breath. EXAM: PORTABLE CHEST 1 VIEW COMPARISON:  09/26/2022 FINDINGS: The cardio pericardial silhouette is enlarged. Low lung volumes. There is pulmonary vascular congestion without overt  pulmonary edema. No overt pulmonary edema, focal airspace consolidation, or pleural effusion. Left-sided AICD again noted. IMPRESSION: Low volume film with vascular congestion. Electronically Signed   By: Kennith Center M.D.   On: 09/29/2022 07:16   ECHOCARDIOGRAM COMPLETE  Result Date: 09/28/2022    ECHOCARDIOGRAM REPORT   Patient Name:   JONAEL PARADISO Date of Exam: 09/28/2022 Medical Rec #:  [id number]          Height:       70.0 in Accession #:    [id number]         Weight:       280.0 lb Date of Birth:  [birth year]          BSA:          2.408 m Patient Age:    66 years           BP:           120/92  mmHg Patient Gender: M                  HR:           83 bpm. Exam Location:  Inpatient Procedure: 2D Echo, Color Doppler and Cardiac Doppler Indications:    acute systolic chf  History:        Patient has prior history of Echocardiogram examinations, most                 recent 10/30/2018. CAD, COPD and chronic kidney disease.                 Cirrhosis., Signs/Symptoms:Edema; Risk Factors:Hypertension,                 Dyslipidemia, Diabetes and Current Smoker.  Sonographer:    Delcie Roch RDCS Referring Phys: [id number] RIPUDEEP K RAI  Sonographer Comments: Patient is obese. Image acquisition challenging due to patient body habitus. IMPRESSIONS  1. Left ventricular ejection fraction, by estimation, is 45 to 50%. The left ventricle has mildly decreased function. The left ventricle demonstrates global hypokinesis. The left ventricular internal cavity size was moderately dilated. There is mild left ventricular hypertrophy of the basal-septal segment. Left ventricular diastolic parameters are consistent with Grade I diastolic dysfunction (impaired relaxation).  2. Right ventricular systolic function is normal. The right ventricular size is normal. Tricuspid regurgitation signal is inadequate for assessing PA pressure.  3. The mitral valve is normal in structure. No evidence of mitral valve regurgitation. No  evidence of mitral stenosis.  4. The aortic valve is normal in structure. Aortic valve regurgitation is not visualized. No aortic stenosis is present.  5. The inferior vena cava is normal in size with greater than 50% respiratory variability, suggesting right atrial pressure of 3 mmHg. FINDINGS  Left Ventricle: Left ventricular ejection fraction, by estimation, is 45 to 50%. The left ventricle has mildly decreased function. The left ventricle demonstrates global hypokinesis. The left ventricular internal cavity size was moderately dilated. There is mild left ventricular hypertrophy of the basal-septal segment. Left ventricular diastolic parameters are consistent with Grade I diastolic dysfunction (impaired relaxation). Normal left ventricular filling pressure. Right Ventricle: The right ventricular size is normal. No increase in right ventricular wall thickness. Right ventricular systolic function is normal. Tricuspid regurgitation signal is inadequate for assessing PA pressure. Left Atrium: Left atrial size was normal in size. Right Atrium: Right atrial size was normal in size. Pericardium: There is no evidence of pericardial effusion. Mitral Valve: The mitral valve is normal in structure. Mild to moderate mitral annular calcification. No evidence of mitral valve regurgitation. No evidence of mitral valve stenosis. Tricuspid Valve: The tricuspid valve is normal in structure. Tricuspid valve regurgitation is not demonstrated. No evidence of tricuspid stenosis. Aortic Valve: The aortic valve is normal in structure. Aortic valve regurgitation is not visualized. No aortic stenosis is present. Pulmonic Valve: The pulmonic valve was normal in structure. Pulmonic valve regurgitation is not visualized. No evidence of pulmonic stenosis. Aorta: The aortic root is normal in size and structure. Venous: The inferior vena cava is normal in size with greater than 50% respiratory variability, suggesting right atrial pressure of 3  mmHg. IAS/Shunts: No atrial level shunt detected by color flow Doppler. Additional Comments: A device lead is visualized.  LEFT VENTRICLE PLAX 2D LVIDd:         6.50 cm      Diastology LVIDs:         4.80 cm  LV e' medial:    5.33 cm/s LV PW:         1.10 cm      LV E/e' medial:  17.0 LV IVS:        1.20 cm      LV e' lateral:   9.68 cm/s LVOT diam:     2.00 cm      LV E/e' lateral: 9.4 LV SV:         65 LV SV Index:   27 LVOT Area:     3.14 cm  LV Volumes (MOD) LV vol d, MOD A4C: 111.0 ml LV vol s, MOD A4C: 59.5 ml LV SV MOD A4C:     111.0 ml RIGHT VENTRICLE             IVC RV S prime:     18.30 cm/s  IVC diam: 1.90 cm TAPSE (M-mode): 3.0 cm LEFT ATRIUM             Index LA diam:        4.40 cm 1.83 cm/m LA Vol (A2C):   78.8 ml 32.72 ml/m LA Vol (A4C):   64.2 ml 26.66 ml/m LA Biplane Vol: 72.0 ml 29.90 ml/m  AORTIC VALVE LVOT Vmax:   122.00 cm/s LVOT Vmean:  80.100 cm/s LVOT VTI:    0.207 m  AORTA Ao Root diam: 3.20 cm Ao Asc diam:  3.40 cm MITRAL VALVE MV Area (PHT): 3.27 cm     SHUNTS MV Decel Time: 232 msec     Systemic VTI:  0.21 m MV E velocity: 90.70 cm/s   Systemic Diam: 2.00 cm MV A velocity: 113.00 cm/s MV E/A ratio:  0.80 Armanda Magic MD Electronically signed by Armanda Magic MD Signature Date/Time: 09/28/2022/7:33:40 PM    Final    CT HEAD WO CONTRAST ( )  Result Date: 09/27/2022 CLINICAL DATA:  Follow-up subdural hematoma EXAM: CT HEAD WITHOUT CONTRAST TECHNIQUE: Contiguous axial images were obtained from the base of the skull through the vertex without intravenous contrast. RADIATION DOSE REDUCTION: This exam was performed according to the departmental dose-optimization program which includes automated exposure control, adjustment of the mA and/or kV according to patient size and/or use of iterative reconstruction technique. COMPARISON:  09/23/2022 FINDINGS: Brain: Again noted is the left para falcine subdural hematoma. This measures 5 mm in thickness compared to 6 mm previously. Overall,  the size of the subdural hematoma appears to have decreased. Subdural hematoma along the posterior falx measures 4 mm, stable since prior study. No new areas of hemorrhage. No infarct or hydrocephalus. Mild cerebral atrophy. Vascular: No hyperdense vessel or unexpected calcification. Skull: No acute calvarial abnormality. Sinuses/Orbits: No acute findings Other: None IMPRESSION: Left parafalcine subdural hematoma again noted. The superior component appears to have slightly decreased in size wall the posterior component is stable. No new areas of hemorrhage. Electronically Signed   By: Charlett Nose M.D.   On: 09/27/2022 18:19   CT Angio Chest PE W and/or Wo Contrast  Result Date: 09/27/2022 CLINICAL DATA:  Shortness of breath. EXAM: CT ANGIOGRAPHY CHEST WITH CONTRAST TECHNIQUE: Multidetector CT imaging of the chest was performed using the standard protocol during bolus administration of intravenous contrast. Multiplanar CT image reconstructions and MIPs were obtained to evaluate the vascular anatomy. RADIATION DOSE REDUCTION: This exam was performed according to the departmental dose-optimization program which includes automated exposure control, adjustment of the mA and/or kV according to patient size and/or use of iterative reconstruction technique. CONTRAST:  75mL OMNIPAQUE  IOHEXOL 350 MG/ML SOLN COMPARISON:  September 22, 2022 FINDINGS: Cardiovascular: A single lead ventricular pacer is noted. There is marked severity calcification of the aortic arch, without evidence of aortic aneurysm. Satisfactory opacification of the pulmonary arteries to the segmental level. No evidence of pulmonary embolism. There is mild cardiomegaly with marked severity coronary artery calcification. No pericardial effusion. Mediastinum/Nodes: No enlarged mediastinal, hilar, or axillary lymph nodes. Thyroid gland, trachea, and esophagus demonstrate no significant findings. Lungs/Pleura: Mild atelectasis is seen along the posterior aspects  of the right upper lobe, right middle lobe and right lower lobe. No pleural effusion or pneumothorax is identified. Upper Abdomen: The liver is cirrhotic in appearance. The spleen is markedly enlarged. Multiple punctate parenchymal calcifications are seen within the head of the pancreas. There is stenting of the visualized portion of the suprarenal and infrarenal abdominal aorta. Bilateral renal artery stents are also noted. Musculoskeletal: Chronic appearing lateral ninth and posterior tenth and eleventh right rib fractures are seen. A metallic density fixation plate and screws are seen along the anterior aspect of the lower cervical spine. Multilevel degenerative changes seen throughout the thoracic spine. Review of the MIP images confirms the above findings. IMPRESSION: 1. No evidence of pulmonary embolism. 2. Mild posterior right upper lobe, right middle lobe and right lower lobe atelectasis. 3. Mild cardiomegaly with marked severity coronary artery calcification. 4. Cirrhotic liver with marked splenomegaly. 5. Abdominal aortic stenting. 6. Chronic appearing lateral ninth and posterior tenth and eleventh right rib fractures. 7. Aortic atherosclerosis. Aortic Atherosclerosis (ICD10-I70.0). Electronically Signed   By: Aram Candela M.D.   On: 09/27/2022 01:13   DG Chest Port 1 View  Result Date: 09/26/2022 CLINICAL DATA:  Shortness of breath upon exertion. EXAM: PORTABLE CHEST 1 VIEW COMPARISON:  09/22/2022. FINDINGS: The heart is enlarged and the mediastinal contour is stable. Rightward shift of the heart and mediastinal structures is unchanged. The pulmonary vasculature is distended. No consolidation, effusion, or pneumothorax. A single lead pacemaker device is present over the left chest. Cervical spinal fusion hardware and abdominal aortic endograft are noted. No acute osseous abnormality. IMPRESSION: Cardiomegaly with pulmonary vascular congestion. Electronically Signed   By: Thornell Sartorius M.D.   On:  09/26/2022 22:34      Signature  -   Susa Raring M.D on 09/29/2022 at 10:20 AM   -  To page go to [email or web address]

## 2022-09-29 NOTE — Progress Notes (Signed)
Physical Therapy Treatment Patient Details Name: [name] MRN: [id number] DOB: [birth year] Today's Date: 09/29/2022   History of Present Illness Pt is a 67 y.o. M who presents 09/26/2022 with fatigue and shortness of breath from [facility] where he is a resident. + fluid overload, stasis changes and hyponatremia. CT scan showed 4 mm SDH. Significant PMH: Admitted 4/19-4/21/24 with SDH likely from traumatic fall, cirrhosis, COPD, CAD with MI, HTN, AAA, CHF, T2DM, spinal stenosis.    PT Comments    Pt limited during today's session by fatigue. Pt found on bed pan, reports being done with using the bathroom. Pt requiring modAx2 to roll L and R to get off bed pan, perform hygiene, and change linens. After increased rolling, pt declining attempts at sitting EOB or transferring even with increased encouragement, reports feeling fatigued and requesting to rest. Acute PT will follow pt as appropriate, discharge recommendations remain appropriate.     Recommendations for follow up therapy are one component of a multi-disciplinary discharge planning process, led by the attending physician.  Recommendations may be updated based on patient status, additional functional criteria and insurance authorization.  Follow Up Recommendations  Can patient physically be transported by private vehicle: No    Assistance Recommended at Discharge Frequent or constant Supervision/Assistance  Patient can return home with the following A lot of help with bathing/dressing/bathroom;Assistance with cooking/housework;Direct supervision/assist for medications management;Assist for transportation;Two people to help with walking and/or transfers   Equipment Recommendations  None recommended by PT    Recommendations for Other Services       Precautions / Restrictions Precautions Precautions: Fall Restrictions Weight Bearing Restrictions: No     Mobility  Bed Mobility Overal bed mobility: Needs  Assistance Bed Mobility: Rolling Rolling: Mod assist, +2 for physical assistance         General bed mobility comments: modA x2 for rolling to get pt off bed pan and for changing of linens. After clean-up pt declining any further mobility, reporting fatigue    Transfers                   General transfer comment: pt declining any attempts    Ambulation/Gait               General Gait Details: pt declining any attempts   Stairs             Wheelchair Mobility    Modified Rankin (Stroke Patients Only)       Balance Overall balance assessment: Needs assistance                                          Cognition Arousal/Alertness: Lethargic Behavior During Therapy: WFL for tasks assessed/performed Overall Cognitive Status: No family/caregiver present to determine baseline cognitive functioning Area of Impairment: Memory, Awareness, Problem solving, Safety/judgement                     Memory: Decreased short-term memory   Safety/Judgement: Decreased awareness of safety, Decreased awareness of deficits Awareness: Emergent Problem Solving: Requires verbal cues, Decreased initiation, Difficulty sequencing General Comments: pt sleeping while seated on bed pan upon arrival, waking up to auditory stimuli but drowsy throughout        Exercises      General Comments General comments (skin integrity, edema, etc.): HR in the 120s with rolling  Pertinent Vitals/Pain Pain Assessment Pain Assessment: Faces Faces Pain Scale: No hurt    Home Living                          Prior Function            PT Goals (current goals can now be found in the care plan section) Acute Rehab PT Goals Patient Stated Goal: less pain PT Goal Formulation: With patient Time For Goal Achievement: 10/12/22 Potential to Achieve Goals: Fair Progress towards PT goals: PT to reassess next treatment (goals added today as none were  established)    Frequency    Min 2X/week      PT Plan Current plan remains appropriate    Co-evaluation              AM-PAC PT "6 Clicks" Mobility   Outcome Measure  Help needed turning from your back to your side while in a flat bed without using bedrails?: A Lot Help needed moving from lying on your back to sitting on the side of a flat bed without using bedrails?: A Lot Help needed moving to and from a bed to a chair (including a wheelchair)?: Total Help needed standing up from a chair using your arms (e.g., wheelchair or bedside chair)?: Total Help needed to walk in hospital room?: Total Help needed climbing 3-5 steps with a railing? : Total 6 Click Score: 8    End of Session   Activity Tolerance: Patient limited by fatigue Patient left: in bed;with call bell/phone within reach;with bed alarm set Nurse Communication: Mobility status PT Visit Diagnosis: Unsteadiness on feet (R26.81);History of falling (Z91.81);Muscle weakness (generalized) (M62.81)     Time: 1610-9604 PT Time Calculation (min) (ACUTE ONLY): 17 min  Charges:  $Therapeutic Activity: 8-22 mins                     [name], [name] Acute Rehabilitation Services Office [phone]    [name] 09/29/2022, 4:05 PM

## 2022-09-29 NOTE — Evaluation (Signed)
Clinical/Bedside Swallow Evaluation Patient Details  Name: [name] MRN: [id number] Date of Birth: [birth year]  Today's Date: 09/29/2022 Time: SLP Start Time (ACUTE ONLY): 1010 SLP Stop Time (ACUTE ONLY): 1021 SLP Time Calculation (min) (ACUTE ONLY): 11 min  Past Medical History:  Past Medical History:  Diagnosis Date   AAA (abdominal aortic aneurysm) (HCC) 09/2012--  monitored by dr Myra Gianotti   stable 5.6cm CTA abdomen 2016   Abnormal drug screen 07/09/2016   1/2/018 - positive oxycodone, fentanyl, inapprop positive MJ - mod risk   Allergic rhinitis    Ascites 03/2019   B12 deficiency    CAD (coronary artery disease) cardiologist-  dr Jens Som   x3 with stents last 2005, EF 40%, predominantly RCA by CT 2016   Cataracts, bilateral    Cervical spondylosis 05/2010   s/p surgery   Charcot Hilda Lias Tooth muscular atrophy dx  1975   neurologist--  dr love--  type 2 per pt   Chronic pain syndrome    [facility] with [facility] --> disagreement and transfered care to Dr Regenia Skeeter at [facility] clinic [facility], requests PCP write Rx but f/u with pain clinic Q6-12 months   COPD (chronic obstructive pulmonary disease) (HCC) 10/2011   minimal by PFTs   DDD (degenerative disc disease)    Disturbances of sensation of smell and taste    improving   Dyspnea on exertion    GERD (gastroesophageal reflux disease)    Gout    Headache    Hepatitis    hepatitis B   Hidradenitis    right groin   Hidradenitis suppurativa dx 2011   goin and leg crease   followd by Roxan Hockey - daily bactrim, s/p intralesional steroid injection 10/2010   Hip osteoarthritis    s/p intraarticular steroid shot (12/2012) ([place])   History of hepatitis B 1983   History of MI (myocardial infarction)    2000  &  2005   History of pneumonia    History of viral meningitis 2000   HLD (hyperlipidemia)    HTN (hypertension)    Ischemic cardiomyopathy    s/p inferior MI  --  current ef per  myoview 39%   Liver cirrhosis secondary to NASH (HCC) 01/2014   by CT scan, rec virtual colonoscopy by Dr Loreta Ave 06/2014   Lumbar herniated disc    Myocardial infarction (HCC)    x2   Nocturia more than twice per night    Obesity    Spinal stenosis    released from NSG.  established with [facility] (07/2013)   T2DM (type 2 diabetes mellitus) (HCC)    ABIs WNL 2016   Vitamin D deficiency    Past Surgical History:  Past Surgical History:  Procedure Laterality Date   ABDOMINAL AORTIC ENDOVASCULAR FENESTRATED STENT GRAFT N/A 11/30/2015   Procedure: ABDOMINAL AORTIC ENDOVASCULAR FENESTRATED STENT GRAFT;  Surgeon: Nada Libman, MD;  Location: [facility] OR;  Service: Vascular;  Laterality: N/A;   ANTERIOR CERVICAL DECOMP/DISCECTOMY FUSION  01-07-2010    C4 -- C7   CARDIAC CATHETERIZATION  03-30-2005  dr Samule Ohm   ef 40% w/ inferior akinesis/  LM and CFX angiographically normal/  pLAD 30%/   Widely patent stents in RCA and PDA widely patent   CARDIOVASCULAR STRESS TEST  10-23-2012  dr Jens Som   No ischemia/  Moderate scar in the inferior wall, otherwise normal perfusion/  LV ef 39%,  LV wall motion: inferior/ inferolateral hypokinesis   COLONOSCOPY  05/06/2007  normal, small int hemorrhoids rpt 5 yrs due to fmhx - rec against rpt colonoscopy by Dr Loreta Ave   CORONARY ANGIOPLASTY  2000  dr Jens Som   PCI to RCA and PDA   CORONARY ANGIOPLASTY WITH STENT PLACEMENT  03-19-2005  dr Chrissie Noa downey   inferior STEMI--- DES x4 to RCA w/ balloon angioplasty and balloon angioplasty to jailed PDA ostium/  severe hypokinesis of midinferor wall, ef 50%/  dLM 20%,  mLAD 20%,  dCFX 60%   ESOPHAGOGASTRODUODENOSCOPY  01/2017   dilated benign esophageal stenosis, portal hypertensive gastropathy Marina Goodell)   ESOPHAGOGASTRODUODENOSCOPY (EGD) WITH PROPOFOL N/A 02/20/2018   benign biopsy Tobi Bastos, Sharlet Salina, MD)   ESOPHAGOGASTRODUODENOSCOPY (EGD) WITH PROPOFOL N/A 05/12/2019   Procedure: ESOPHAGOGASTRODUODENOSCOPY (EGD) WITH  PROPOFOL;  Surgeon: Wyline Mood, MD;  Location: [facility] ENDOSCOPY;  Service: Gastroenterology;  Laterality: N/A;   HYDRADENITIS EXCISION Right 12/31/2014   Procedure: WIDE EXCISION HIDRADENITIS GROIN; Abigail Miyamoto, MD   IR PARACENTESIS  03/25/2019   LUMBAR DISC SURGERY     L5-S1   LUMBAR LAMINECTOMY  05-18-2010   L2--5   laminectomy/foraminotomy for stenosis ([place])   MYELOGRAM     L5-S1 and L1-2 spondylosis   SACROILIAC JOINT INJECTION Bilateral 10/2013   [place]   TONSILLECTOMY AND ADENOIDECTOMY  1972   HPI:  [name] is a 67 y.o. male.  He was last admitted from 4/19-21 with a fall at [facility].  He was found to have AKI, a traumatic SDH, and scrotal cellulitis; Plavix was to be held for 2 weeks post-bleed.  He reports that since he returned to his facility he has been feeling very fatigued and SOB.  He has LE edema and stasis changes. CXR 4/26 with no consolidation.  CT chest 4/24 suggestive of atelectasis.  Pt with medical history significant of NASH cirrhosis, COPD, CAD, HTN, AAA, chronic combined CHF, and DM presenting with SOB.    Assessment / Plan / Recommendation  Clinical Impression  Pt presents with functional swallowing as assessed clinically.  Pt tolerated all consistencies trialed with no clinical s/s of aspiration or evidence of respiratory decline.  Pt reports that there is concern for new stroke.  If repeat head imaging is completed and shows increased risk for silent aspiration, please reconsult speech therapy.    Reommend continuing regular texture diet with thin liquids.  SLP Visit Diagnosis: Dysphagia, unspecified (R13.10)    Aspiration Risk  No limitations    Diet Recommendation Regular;Thin liquid   Liquid Administration via: Cup;Straw Medication Administration: Whole meds with liquid Supervision: Patient able to self feed Compensations: Slow rate;Small sips/bites Postural Changes: Seated upright at 90 degrees    Other  Recommendations Oral Care  Recommendations: Oral care BID    Recommendations for follow up therapy are one component of a multi-disciplinary discharge planning process, led by the attending physician.  Recommendations may be updated based on patient status, additional functional criteria and insurance authorization.  Follow up Recommendations No SLP follow up      Assistance Recommended at Discharge  N/A  Functional Status Assessment Patient has not had a recent decline in their functional status  Frequency and Duration  (N/A)          Prognosis Prognosis for improved oropharyngeal function:  (N/A)      Swallow Study   General Date of Onset: 09/27/22 HPI: [name] is a 67 y.o. male.  He was last admitted from 4/19-21 with a fall at [facility].  He was found to have AKI, a  traumatic SDH, and scrotal cellulitis; Plavix was to be held for 2 weeks post-bleed.  He reports that since he returned to his facility he has been feeling very fatigued and SOB.  He has LE edema and stasis changes. CXR 4/26 with no consolidation.  CT chest 4/24 suggestive of atelectasis.  Pt with medical history significant of NASH cirrhosis, COPD, CAD, HTN, AAA, chronic combined CHF, and DM presenting with SOB. Previous Swallow Assessment: None Diet Prior to this Study: Regular Temperature Spikes Noted: No Respiratory Status: Nasal cannula History of Recent Intubation: No Behavior/Cognition: Alert;Cooperative;Pleasant mood Oral Cavity Assessment: Within Functional Limits Oral Care Completed by SLP: No Oral Cavity - Dentition: Edentulous;Dentures, not available (largely) Self-Feeding Abilities: Able to feed self Patient Positioning: Upright in bed Baseline Vocal Quality: Normal Volitional Cough: Strong Volitional Swallow: Able to elicit    Oral/Motor/Sensory Function Overall Oral Motor/Sensory Function: Mild impairment Facial ROM: Reduced left Facial Symmetry: Abnormal symmetry left Lingual ROM: Within Functional Limits Lingual  Symmetry: Within Functional Limits (slight L protrusion with effort for strength testing?) Lingual Strength: Within Functional Limits Velum: Within Functional Limits Mandible: Within Functional Limits   Ice Chips Ice chips: Not tested   Thin Liquid Thin Liquid: Within functional limits Presentation: Straw    Nectar Thick Nectar Thick Liquid: Not tested   Honey Thick Honey Thick Liquid: Not tested   Puree Puree: Within functional limits Presentation: Spoon   Solid     Solid: Within functional limits      Kerrie Pleasure, MA, CCC-SLP Acute Rehabilitation Services Office: [phone] 09/29/2022,11:03 AM

## 2022-09-30 ENCOUNTER — Inpatient Hospital Stay (HOSPITAL_COMMUNITY): Payer: Medicare HMO

## 2022-09-30 DIAGNOSIS — R0609 Other forms of dyspnea: Secondary | ICD-10-CM | POA: Diagnosis not present

## 2022-09-30 LAB — CBC WITH DIFFERENTIAL/PLATELET
Abs Immature Granulocytes: 0.13 10*3/uL — ABNORMAL HIGH (ref 0.00–0.07)
Basophils Absolute: 0.1 10*3/uL (ref 0.0–0.1)
Basophils Relative: 1 %
Eosinophils Absolute: 0.2 10*3/uL (ref 0.0–0.5)
Eosinophils Relative: 2 %
HCT: 33.4 % — ABNORMAL LOW (ref 39.0–52.0)
Hemoglobin: 11.3 g/dL — ABNORMAL LOW (ref 13.0–17.0)
Immature Granulocytes: 2 %
Lymphocytes Relative: 10 %
Lymphs Abs: 0.9 10*3/uL (ref 0.7–4.0)
MCH: 35.8 pg — ABNORMAL HIGH (ref 26.0–34.0)
MCHC: 33.8 g/dL (ref 30.0–36.0)
MCV: 105.7 fL — ABNORMAL HIGH (ref 80.0–100.0)
Monocytes Absolute: 0.7 10*3/uL (ref 0.1–1.0)
Monocytes Relative: 8 %
Neutro Abs: 6.8 10*3/uL (ref 1.7–7.7)
Neutrophils Relative %: 77 %
Platelets: 40 10*3/uL — ABNORMAL LOW (ref 150–400)
RBC: 3.16 MIL/uL — ABNORMAL LOW (ref 4.22–5.81)
RDW: 16.3 % — ABNORMAL HIGH (ref 11.5–15.5)
WBC: 8.7 10*3/uL (ref 4.0–10.5)
nRBC: 0 % (ref 0.0–0.2)

## 2022-09-30 LAB — COMPREHENSIVE METABOLIC PANEL
ALT: 34 U/L (ref 0–44)
AST: 55 U/L — ABNORMAL HIGH (ref 15–41)
Albumin: 2 g/dL — ABNORMAL LOW (ref 3.5–5.0)
Alkaline Phosphatase: 160 U/L — ABNORMAL HIGH (ref 38–126)
Anion gap: 3 — ABNORMAL LOW (ref 5–15)
BUN: 31 mg/dL — ABNORMAL HIGH (ref 8–23)
CO2: 30 mmol/L (ref 22–32)
Calcium: 8.4 mg/dL — ABNORMAL LOW (ref 8.9–10.3)
Chloride: 95 mmol/L — ABNORMAL LOW (ref 98–111)
Creatinine, Ser: 1.1 mg/dL (ref 0.61–1.24)
GFR, Estimated: >60 mL/min (ref >60)
Glucose, Bld: 164 mg/dL — ABNORMAL HIGH (ref 70–99)
Potassium: 4.3 mmol/L (ref 3.5–5.1)
Sodium: 128 mmol/L — ABNORMAL LOW (ref 135–145)
Total Bilirubin: 2.9 mg/dL — ABNORMAL HIGH (ref 0.3–1.2)
Total Protein: 7.6 g/dL (ref 6.5–8.1)

## 2022-09-30 LAB — BLOOD GAS, ARTERIAL
Acid-Base Excess: 10.3 mmol/L — ABNORMAL HIGH (ref 0.0–2.0)
Bicarbonate: 35.1 mmol/L — ABNORMAL HIGH (ref 20.0–28.0)
Drawn by: 406621
O2 Saturation: 96.9 %
Patient temperature: 37
pCO2 arterial: 46 mmHg (ref 32–48)
pH, Arterial: 7.49 — ABNORMAL HIGH (ref 7.35–7.45)
pO2, Arterial: 82 mmHg — ABNORMAL LOW (ref 83–108)

## 2022-09-30 LAB — MAGNESIUM: Magnesium: 2.2 mg/dL (ref 1.7–2.4)

## 2022-09-30 LAB — AMMONIA: Ammonia: 73 umol/L — ABNORMAL HIGH (ref 9–35)

## 2022-09-30 LAB — BRAIN NATRIURETIC PEPTIDE: B Natriuretic Peptide: 106.3 pg/mL — ABNORMAL HIGH (ref 0.0–100.0)

## 2022-09-30 MED ORDER — ALBUMIN HUMAN 25 % IV SOLN
25.0000 g | Freq: Once | INTRAVENOUS | Status: AC
  Administered 2022-09-30: 25 g via INTRAVENOUS
  Filled 2022-09-30: qty 100

## 2022-09-30 MED ORDER — LACTATED RINGERS IV SOLN: INTRAVENOUS | Status: DC

## 2022-09-30 MED ORDER — MIDODRINE HCL 5 MG PO TABS
10.0000 mg | ORAL_TABLET | Freq: Three times a day (TID) | ORAL | Status: DC
  Administered 2022-09-30 – 2022-10-03 (×9): 10 mg via ORAL
  Filled 2022-09-30 (×9): qty 2

## 2022-09-30 MED ORDER — LACTATED RINGERS IV SOLN: INTRAVENOUS | Status: AC

## 2022-09-30 MED ORDER — LACTATED RINGERS IV BOLUS
500.0000 mL | Freq: Once | INTRAVENOUS | Status: AC
  Administered 2022-09-30: 500 mL via INTRAVENOUS

## 2022-09-30 MED ORDER — NALOXONE HCL 0.4 MG/ML IJ SOLN
0.4000 mg | INTRAMUSCULAR | Status: DC | PRN
  Administered 2022-09-30: 0.4 mg via INTRAVENOUS
  Filled 2022-09-30: qty 1

## 2022-09-30 NOTE — Progress Notes (Signed)
Pt was alert oriented but very lethargic(Was going back to sleep immediately after waking). MD and respiratory therapist made aware.ABG drawn,rapid response called.Narcan administerd.Pt was hypotensive too BP was on 80s so LR bolus administered.

## 2022-09-30 NOTE — Progress Notes (Signed)
RT called to bedside for pt being more lethargic. ABG obtained on 2L [place]. PT very lethargic but will respond to his name being called but then goes back to sleep. RT informed Rapid Response RN in case we needed to transfer him to higher level of care quickly as he was hypotensive

## 2022-09-30 NOTE — Progress Notes (Addendum)
PROGRESS NOTE                                                                                                                                                                                                             Patient Demographics:    [name], is a 67 y.o. male, DOB - 24-Jul-1955, GNF:[id number]  Outpatient Primary MD for the patient is Miguel Boyden, MD    LOS - 2  Admit date - 09/26/2022    Chief Complaint  Patient presents with   Shortness of Breath       Brief Narrative (HPI from H&P)     67 year old male with history of Charcot-Marie-Tooth disease, NASH cirrhosis, COPD, CAD, HTN, AAA, chronic combined CHF, and DM presented with shortness of breath. He was last admitted from 4/19-21 with a fall at [facility].  He was found to have AKI, a traumatic SDH, and scrotal cellulitis; Plavix was to be held for 2 weeks post-bleed.  He reports that since he returned to his facility he has been feeling very fatigued and SOB.  He has LE edema and stasis changes.  In the ER he was found to be profoundly dehydrated, hypotensive however had evidence of third spacing as well he received IV fluids for blood pressure support and was admitted for further care.   Subjective:    [name] today has, No headache, No chest pain, No abdominal pain - No Nausea, No new weakness tingling or numbness, no SOB, chronic RLL weakness   Assessment  & Plan :   Acute on chronic dyspnea with generalized weakness, due to acute on chronic combined diastolic and systolic CHF of now 45% improved somewhat from before.  Currently not on any O2, chest x-ray with cardiomegaly and pulmonary vascular congestion.  CT chest unremarkable, no PE, mild posterior right upper lobe, middle lobe and lower lobe atelectasis.  He has been diuresed and now actually getting intravascularly depleted with worsening hyponatremia and hypotension with diuretics, repeat  echo noted which shows mild improvement in EF as compared to the last echo in 2020.  Continue diuretics as tolerated by his blood pressure, added protein supplementation to augment his blood pressures and to reduce third spacing.  For now low-dose beta-blocker, if blood pressure holds will entertain Aldactone, Entresto, ACE/ARB.    Chronic combined systolic and diastolic heart  failure kindly see #1 above.  Acute on chronic hyponatremia  - patient has cirrhosis and does develop third spacing from time to time, he was adequately diuresed now appears intravascularly depleted, no peripheral edema, no crackles, is becoming hypotensive, will give a trial of IV albumin along with IV fluids, may become a tricky issue due to underlying cirrhosis, hypoproteinemia in combination with underlying CHF.  Recent fall at SNF with Subdural hematoma  -  Most likely traumatic from fall at SNF, CT head here stable, placed to resume Plavix, 2 weeks of hold is over.  Will resume Plavix from 10/08/2022.   Cellulitis of scrotum - was diagnosed last admission finishing his oral doxycycline.   CAD: Currently stable, Plavix once stable from subdural standpoint, allergic to statin, aspirin discontinued last admission, low-dose beta-blocker added will monitor blood pressure, diuretics for now.  No acute issues.  Hyperlipidemia: Intolerant to statin, continue low-cholesterol diet.  Cirrhosis of liver, unspecified cause - Portal hypertension with splenomegaly, thrombocytopenia and esophageal varices, with some hepatic encephalopathy and elevated ammonia. - Continue diuretics as needed along with scheduled rifaximin along with lactulose.  Level improving.  Post discharge outpatient follow-up with GI.   COPD  - Currently stable.  Continue current inhaled regimen.   Anemia of chronic disease, Macrocytosis -  Possibly from chronic illnesses.  Hemoglobin stable.  Monitor intermittently   Thrombocytopenia  - CT from cirrhosis of liver  and splenomegaly,     Fall at SNF  - Possibly mechanical.  Fall precautions.  PT eval.  TOC consult, PT/OT to be arranged at SNF.   Tobacco abuse  -Currently on nicotine patch.  Counseled to quit.   Chronic pain  - Continue gabapentin, fentanyl patch and oxycodone as needed   Morbid obesity with BMI of 40 -  follow-up with PCP        Condition - Extremely Guarded  Family Communication  :  called sister [name] [phone]  09/29/2022 at 10:20 AM and message left  Code Status :  DNR  Consults  :  None  PUD Prophylaxis : PPI   Procedures  :     CT - 1. No evidence of pulmonary embolism. 2. Mild posterior right upper lobe, right middle lobe and right lower lobe atelectasis. 3. Mild cardiomegaly with marked severity coronary artery calcification. 4. Cirrhotic liver with marked splenomegaly. 5. Abdominal aortic stenting. 6. Chronic appearing lateral ninth and posterior tenth and eleventh right rib fractures. 7. Aortic atherosclerosis. Aortic Atherosclerosis   TTE -  1. Left ventricular ejection fraction, by estimation, is 45 to 50%. The left ventricle has mildly decreased function. The left ventricle demonstrates global hypokinesis. The left ventricular internal cavity size was moderately dilated. There is mild left ventricular hypertrophy of the basal-septal segment. Left ventricular diastolic parameters are consistent with Grade I diastolic dysfunction (impaired relaxation).  2. Right ventricular systolic function is normal. The right ventricular size is normal. Tricuspid regurgitation signal is inadequate for assessing PA pressure.  3. The mitral valve is normal in structure. No evidence of mitral valve regurgitation. No evidence of mitral stenosis.  4. The aortic valve is normal in structure. Aortic valve regurgitation is not visualized. No aortic stenosis is present.  5. The inferior vena cava is normal in size with greater than 50% respiratory variability, suggesting right atrial pressure  of 3 mmHg.      Disposition Plan  :    Status is: Inpatient   DVT Prophylaxis  :    SCDs Start:  09/27/22 1019    Lab Results  Component Value Date   PLT 40 (L) 09/30/2022    Diet :  Diet Order             Diet Heart Room service appropriate? Yes; Fluid consistency: Thin; Fluid restriction: 1800 mL Fluid  Diet effective now                    Inpatient Medications  Scheduled Meds:  (feeding supplement) PROSource Plus  30 mL Oral BID BM   [START ON 10/08/2022] clopidogrel  75 mg Oral Daily   docusate sodium  100 mg Oral BID   fentaNYL  1 patch Transdermal Q72H   gabapentin  300 mg Oral BID   lactulose  30 g Oral TID   metoprolol tartrate  25 mg Oral BID   midodrine  10 mg Oral TID WC   multivitamin with minerals  1 tablet Oral Daily   nicotine  14 mg Transdermal Daily   nystatin   Topical TID   pantoprazole  40 mg Oral Daily   rifaximin  550 mg Oral BID   sodium chloride flush  3 mL Intravenous Q12H   umeclidinium bromide  1 puff Inhalation Daily   Continuous Infusions:  albumin human     lactated ringers     PRN Meds:.acetaminophen **OR** acetaminophen, albuterol, bisacodyl, hydrALAZINE, ondansetron **OR** ondansetron (ZOFRAN) IV, oxyCODONE, polyethylene glycol    Objective:   Vitals:   09/30/22 0700 09/30/22 0725 09/30/22 0730 09/30/22 0900  BP:    (!) 116/50  Pulse:   82   Resp: 17  15   Temp:    98.9 F (37.2 C)  TempSrc:    Oral  SpO2:  94% 97%   Weight:      Height:        Wt Readings from Last 3 Encounters:  09/28/22 127 kg  09/24/22 131.6 kg  07/08/22 (!) 143.8 kg     Intake/Output Summary (Last 24 hours) at 09/30/2022 0924 Last data filed at 09/30/2022 0600 Gross per 24 hour  Intake 603 ml  Output 900 ml  Net -297 ml     Physical Exam  Awake Alert, No new F.N deficits, chronic right lower extremity weakness [place].AT,PERRAL Supple Neck, No JVD,   Symmetrical Chest wall movement, Good air movement bilaterally, CTAB RRR,No  Gallops,Rubs or new Murmurs,  +ve B.Sounds, Abd Soft, No tenderness,   No Cyanosis, no  lower extremity edema      Data Review:    Recent Labs  Lab 09/24/22 0534 09/27/22 0005 09/28/22 0155 09/29/22 0548 09/30/22 0314  WBC 5.8 9.3 7.3 7.7 8.7  HGB 11.5* 11.6* 11.3* 10.7* 11.3*  HCT 34.5* 33.5* 31.9* 31.9* 33.4*  PLT 44* 42* 41* 38* 40*  MCV 105.2* 102.4* 102.6* 106.0* 105.7*  MCH 35.1* 35.5* 36.3* 35.5* 35.8*  MCHC 33.3 34.6 35.4 33.5 33.8  RDW 16.5* 16.2* 16.5* 16.2* 16.3*  LYMPHSABS 0.8 0.7  --   --  0.9  MONOABS 0.6 0.9  --   --  0.7  EOSABS 0.2 0.1  --   --  0.2  BASOSABS 0.0 0.0  --   --  0.1    Recent Labs  Lab 09/24/22 0350 09/26/22 2147 09/27/22 0005 09/28/22 0155 09/28/22 1630 09/29/22 0548 09/30/22 0314  NA 131*  --  125* 130*  --  132* 128*  K 3.3*  --  3.2* 3.3*  --  4.2 4.3  CL 91*  --  90* 92*  --  94* 95*  CO2 29  --  26 29  --  29 30  ANIONGAP 11  --  9 9  --  9 3*  GLUCOSE 98  --  133* 111*  --  129* 164*  BUN 30*  --  34* 33*  --  21 31*  CREATININE 1.20  --  1.45* 1.20  --  1.05 1.10  AST 43*  --  55*  --   --  49* 55*  ALT 25  --  33  --   --  30 34  ALKPHOS 131*  --  191*  --   --  148* 160*  BILITOT 2.3*  --  3.1*  --   --  3.0* 2.9*  ALBUMIN 2.0*  --  2.2*  --   --  2.0* 2.0*  AMMONIA  --   --  70*  --  120* 74* 73*  BNP  --  61.3  --  102.5*  --  99.1 106.3*  MG 2.0  --   --   --   --  2.0 2.2  CALCIUM 8.6*  --  8.1* 8.1*  --  8.5* 8.4*      Recent Labs  Lab 09/24/22 0350 09/26/22 2147 09/27/22 0005 09/28/22 0155 09/28/22 1630 09/29/22 0548 09/30/22 0314  AMMONIA  --   --  70*  --  120* 74* 73*  BNP  --  61.3  --  102.5*  --  99.1 106.3*  MG 2.0  --   --   --   --  2.0 2.2  CALCIUM 8.6*  --  8.1* 8.1*  --  8.5* 8.4*    Lab Results  Component Value Date   HGBA1C 5.3 01/31/2022      Radiology Reports DG Chest Port 1 View  Result Date: 09/29/2022 CLINICAL DATA:  Shortness of breath. EXAM: PORTABLE CHEST 1 VIEW  COMPARISON:  09/26/2022 FINDINGS: The cardio pericardial silhouette is enlarged. Low lung volumes. There is pulmonary vascular congestion without overt pulmonary edema. No overt pulmonary edema, focal airspace consolidation, or pleural effusion. Left-sided AICD again noted. IMPRESSION: Low volume film with vascular congestion. Electronically Signed   By: Kennith Center M.D.   On: 09/29/2022 07:16   ECHOCARDIOGRAM COMPLETE  Result Date: 09/28/2022    ECHOCARDIOGRAM REPORT   Patient Name:   [name] Date of Exam: 09/28/2022 Medical Rec #:  [id number]          Height:       70.0 in Accession #:    [id number]         Weight:       280.0 lb Date of Birth:  Feb 27, 1956          BSA:          2.408 m Patient Age:    31 years           BP:           120/92 mmHg Patient Gender: M                  HR:           83 bpm. Exam Location:  Inpatient Procedure: 2D Echo, Color Doppler and Cardiac Doppler Indications:    acute systolic chf  History:        Patient has prior history of Echocardiogram examinations, most  recent 10/30/2018. CAD, COPD and chronic kidney disease.                 Cirrhosis., Signs/Symptoms:Edema; Risk Factors:Hypertension,                 Dyslipidemia, Diabetes and Current Smoker.  Sonographer:    Delcie Roch RDCS Referring Phys: [id number] RIPUDEEP K RAI  Sonographer Comments: Patient is obese. Image acquisition challenging due to patient body habitus. IMPRESSIONS  1. Left ventricular ejection fraction, by estimation, is 45 to 50%. The left ventricle has mildly decreased function. The left ventricle demonstrates global hypokinesis. The left ventricular internal cavity size was moderately dilated. There is mild left ventricular hypertrophy of the basal-septal segment. Left ventricular diastolic parameters are consistent with Grade I diastolic dysfunction (impaired relaxation).  2. Right ventricular systolic function is normal. The right ventricular size is normal. Tricuspid  regurgitation signal is inadequate for assessing PA pressure.  3. The mitral valve is normal in structure. No evidence of mitral valve regurgitation. No evidence of mitral stenosis.  4. The aortic valve is normal in structure. Aortic valve regurgitation is not visualized. No aortic stenosis is present.  5. The inferior vena cava is normal in size with greater than 50% respiratory variability, suggesting right atrial pressure of 3 mmHg. FINDINGS  Left Ventricle: Left ventricular ejection fraction, by estimation, is 45 to 50%. The left ventricle has mildly decreased function. The left ventricle demonstrates global hypokinesis. The left ventricular internal cavity size was moderately dilated. There is mild left ventricular hypertrophy of the basal-septal segment. Left ventricular diastolic parameters are consistent with Grade I diastolic dysfunction (impaired relaxation). Normal left ventricular filling pressure. Right Ventricle: The right ventricular size is normal. No increase in right ventricular wall thickness. Right ventricular systolic function is normal. Tricuspid regurgitation signal is inadequate for assessing PA pressure. Left Atrium: Left atrial size was normal in size. Right Atrium: Right atrial size was normal in size. Pericardium: There is no evidence of pericardial effusion. Mitral Valve: The mitral valve is normal in structure. Mild to moderate mitral annular calcification. No evidence of mitral valve regurgitation. No evidence of mitral valve stenosis. Tricuspid Valve: The tricuspid valve is normal in structure. Tricuspid valve regurgitation is not demonstrated. No evidence of tricuspid stenosis. Aortic Valve: The aortic valve is normal in structure. Aortic valve regurgitation is not visualized. No aortic stenosis is present. Pulmonic Valve: The pulmonic valve was normal in structure. Pulmonic valve regurgitation is not visualized. No evidence of pulmonic stenosis. Aorta: The aortic root is normal in  size and structure. Venous: The inferior vena cava is normal in size with greater than 50% respiratory variability, suggesting right atrial pressure of 3 mmHg. IAS/Shunts: No atrial level shunt detected by color flow Doppler. Additional Comments: A device lead is visualized.  LEFT VENTRICLE PLAX 2D LVIDd:         6.50 cm      Diastology LVIDs:         4.80 cm      LV e' medial:    5.33 cm/s LV PW:         1.10 cm      LV E/e' medial:  17.0 LV IVS:        1.20 cm      LV e' lateral:   9.68 cm/s LVOT diam:     2.00 cm      LV E/e' lateral: 9.4 LV SV:         65 LV SV  Index:   27 LVOT Area:     3.14 cm  LV Volumes (MOD) LV vol d, MOD A4C: 111.0 ml LV vol s, MOD A4C: 59.5 ml LV SV MOD A4C:     111.0 ml RIGHT VENTRICLE             IVC RV S prime:     18.30 cm/s  IVC diam: 1.90 cm TAPSE (M-mode): 3.0 cm LEFT ATRIUM             Index LA diam:        4.40 cm 1.83 cm/m LA Vol (A2C):   78.8 ml 32.72 ml/m LA Vol (A4C):   64.2 ml 26.66 ml/m LA Biplane Vol: 72.0 ml 29.90 ml/m  AORTIC VALVE LVOT Vmax:   122.00 cm/s LVOT Vmean:  80.100 cm/s LVOT VTI:    0.207 m  AORTA Ao Root diam: 3.20 cm Ao Asc diam:  3.40 cm MITRAL VALVE MV Area (PHT): 3.27 cm     SHUNTS MV Decel Time: 232 msec     Systemic VTI:  0.21 m MV E velocity: 90.70 cm/s   Systemic Diam: 2.00 cm MV A velocity: 113.00 cm/s MV E/A ratio:  0.80 Armanda Magic MD Electronically signed by Armanda Magic MD Signature Date/Time: 09/28/2022/7:33:40 PM    Final    CT HEAD WO CONTRAST ( )  Result Date: 09/27/2022 CLINICAL DATA:  Follow-up subdural hematoma EXAM: CT HEAD WITHOUT CONTRAST TECHNIQUE: Contiguous axial images were obtained from the base of the skull through the vertex without intravenous contrast. RADIATION DOSE REDUCTION: This exam was performed according to the departmental dose-optimization program which includes automated exposure control, adjustment of the mA and/or kV according to patient size and/or use of iterative reconstruction technique. COMPARISON:   09/23/2022 FINDINGS: Brain: Again noted is the left para falcine subdural hematoma. This measures 5 mm in thickness compared to 6 mm previously. Overall, the size of the subdural hematoma appears to have decreased. Subdural hematoma along the posterior falx measures 4 mm, stable since prior study. No new areas of hemorrhage. No infarct or hydrocephalus. Mild cerebral atrophy. Vascular: No hyperdense vessel or unexpected calcification. Skull: No acute calvarial abnormality. Sinuses/Orbits: No acute findings Other: None IMPRESSION: Left parafalcine subdural hematoma again noted. The superior component appears to have slightly decreased in size wall the posterior component is stable. No new areas of hemorrhage. Electronically Signed   By: Charlett Nose M.D.   On: 09/27/2022 18:19   CT Angio Chest PE W and/or Wo Contrast  Result Date: 09/27/2022 CLINICAL DATA:  Shortness of breath. EXAM: CT ANGIOGRAPHY CHEST WITH CONTRAST TECHNIQUE: Multidetector CT imaging of the chest was performed using the standard protocol during bolus administration of intravenous contrast. Multiplanar CT image reconstructions and MIPs were obtained to evaluate the vascular anatomy. RADIATION DOSE REDUCTION: This exam was performed according to the departmental dose-optimization program which includes automated exposure control, adjustment of the mA and/or kV according to patient size and/or use of iterative reconstruction technique. CONTRAST:  75mL OMNIPAQUE IOHEXOL 350 MG/ML SOLN COMPARISON:  September 22, 2022 FINDINGS: Cardiovascular: A single lead ventricular pacer is noted. There is marked severity calcification of the aortic arch, without evidence of aortic aneurysm. Satisfactory opacification of the pulmonary arteries to the segmental level. No evidence of pulmonary embolism. There is mild cardiomegaly with marked severity coronary artery calcification. No pericardial effusion. Mediastinum/Nodes: No enlarged mediastinal, hilar, or axillary  lymph nodes. Thyroid gland, trachea, and esophagus demonstrate no significant findings. Lungs/Pleura: Mild atelectasis is  seen along the posterior aspects of the right upper lobe, right middle lobe and right lower lobe. No pleural effusion or pneumothorax is identified. Upper Abdomen: The liver is cirrhotic in appearance. The spleen is markedly enlarged. Multiple punctate parenchymal calcifications are seen within the head of the pancreas. There is stenting of the visualized portion of the suprarenal and infrarenal abdominal aorta. Bilateral renal artery stents are also noted. Musculoskeletal: Chronic appearing lateral ninth and posterior tenth and eleventh right rib fractures are seen. A metallic density fixation plate and screws are seen along the anterior aspect of the lower cervical spine. Multilevel degenerative changes seen throughout the thoracic spine. Review of the MIP images confirms the above findings. IMPRESSION: 1. No evidence of pulmonary embolism. 2. Mild posterior right upper lobe, right middle lobe and right lower lobe atelectasis. 3. Mild cardiomegaly with marked severity coronary artery calcification. 4. Cirrhotic liver with marked splenomegaly. 5. Abdominal aortic stenting. 6. Chronic appearing lateral ninth and posterior tenth and eleventh right rib fractures. 7. Aortic atherosclerosis. Aortic Atherosclerosis (ICD10-I70.0). Electronically Signed   By: Aram Candela M.D.   On: 09/27/2022 01:13   DG Chest Port 1 View  Result Date: 09/26/2022 CLINICAL DATA:  Shortness of breath upon exertion. EXAM: PORTABLE CHEST 1 VIEW COMPARISON:  09/22/2022. FINDINGS: The heart is enlarged and the mediastinal contour is stable. Rightward shift of the heart and mediastinal structures is unchanged. The pulmonary vasculature is distended. No consolidation, effusion, or pneumothorax. A single lead pacemaker device is present over the left chest. Cervical spinal fusion hardware and abdominal aortic endograft  are noted. No acute osseous abnormality. IMPRESSION: Cardiomegaly with pulmonary vascular congestion. Electronically Signed   By: Thornell Sartorius M.D.   On: 09/26/2022 22:34      Signature  -   Susa Raring M.D on 09/30/2022 at 9:24 AM   -  To page go to [email or web address]

## 2022-10-01 ENCOUNTER — Inpatient Hospital Stay (HOSPITAL_COMMUNITY): Payer: Medicare HMO

## 2022-10-01 DIAGNOSIS — R0609 Other forms of dyspnea: Secondary | ICD-10-CM | POA: Diagnosis not present

## 2022-10-01 LAB — CBC WITH DIFFERENTIAL/PLATELET
Abs Immature Granulocytes: 0.05 10*3/uL (ref 0.00–0.07)
Basophils Absolute: 0 10*3/uL (ref 0.0–0.1)
Basophils Relative: 0 %
Eosinophils Absolute: 0.2 10*3/uL (ref 0.0–0.5)
Eosinophils Relative: 4 %
HCT: 29.4 % — ABNORMAL LOW (ref 39.0–52.0)
Hemoglobin: 10.1 g/dL — ABNORMAL LOW (ref 13.0–17.0)
Immature Granulocytes: 1 %
Lymphocytes Relative: 11 %
Lymphs Abs: 0.7 10*3/uL (ref 0.7–4.0)
MCH: 36.2 pg — ABNORMAL HIGH (ref 26.0–34.0)
MCHC: 34.4 g/dL (ref 30.0–36.0)
MCV: 105.4 fL — ABNORMAL HIGH (ref 80.0–100.0)
Monocytes Absolute: 0.5 10*3/uL (ref 0.1–1.0)
Monocytes Relative: 8 %
Neutro Abs: 5.3 10*3/uL (ref 1.7–7.7)
Neutrophils Relative %: 76 %
Platelets: 34 10*3/uL — ABNORMAL LOW (ref 150–400)
RBC: 2.79 MIL/uL — ABNORMAL LOW (ref 4.22–5.81)
RDW: 16.4 % — ABNORMAL HIGH (ref 11.5–15.5)
WBC: 6.8 10*3/uL (ref 4.0–10.5)
nRBC: 0 % (ref 0.0–0.2)

## 2022-10-01 LAB — BRAIN NATRIURETIC PEPTIDE: B Natriuretic Peptide: 281.1 pg/mL — ABNORMAL HIGH (ref 0.0–100.0)

## 2022-10-01 LAB — COMPREHENSIVE METABOLIC PANEL
ALT: 31 U/L (ref 0–44)
AST: 49 U/L — ABNORMAL HIGH (ref 15–41)
Albumin: 1.9 g/dL — ABNORMAL LOW (ref 3.5–5.0)
Alkaline Phosphatase: 145 U/L — ABNORMAL HIGH (ref 38–126)
Anion gap: 6 (ref 5–15)
BUN: 30 mg/dL — ABNORMAL HIGH (ref 8–23)
CO2: 27 mmol/L (ref 22–32)
Calcium: 8.2 mg/dL — ABNORMAL LOW (ref 8.9–10.3)
Chloride: 98 mmol/L (ref 98–111)
Creatinine, Ser: 1.08 mg/dL (ref 0.61–1.24)
GFR, Estimated: >60 mL/min (ref >60)
Glucose, Bld: 124 mg/dL — ABNORMAL HIGH (ref 70–99)
Potassium: 3.9 mmol/L (ref 3.5–5.1)
Sodium: 131 mmol/L — ABNORMAL LOW (ref 135–145)
Total Bilirubin: 2.3 mg/dL — ABNORMAL HIGH (ref 0.3–1.2)
Total Protein: 7 g/dL (ref 6.5–8.1)

## 2022-10-01 LAB — MAGNESIUM: Magnesium: 2.2 mg/dL (ref 1.7–2.4)

## 2022-10-01 MED ORDER — KETOROLAC TROMETHAMINE 15 MG/ML IJ SOLN
15.0000 mg | Freq: Once | INTRAMUSCULAR | Status: AC
  Administered 2022-10-01: 15 mg via INTRAVENOUS
  Filled 2022-10-01: qty 1

## 2022-10-01 MED ORDER — OXYCODONE HCL 5 MG PO TABS
5.0000 mg | ORAL_TABLET | Freq: Two times a day (BID) | ORAL | Status: DC | PRN
  Administered 2022-10-01 – 2022-10-02 (×4): 5 mg via ORAL
  Filled 2022-10-01 (×4): qty 1

## 2022-10-01 NOTE — Progress Notes (Signed)
Pt is more alert than before.Asking for pain meds.Upset about oxycodone being discontinued.Offered tylenol/Toradol for pain but refused.And even refused his morning labs.Dr.Hall made aware.

## 2022-10-01 NOTE — Progress Notes (Signed)
PROGRESS NOTE                                                                                                                                                                                                             Patient Demographics:    [name], is a 67 y.o. male, DOB - 22-Oct-1955, ZOX:[id number]  Outpatient Primary MD for the patient is [name], MD    LOS - 3  Admit date - 09/26/2022    Chief Complaint  Patient presents with   Shortness of Breath       Brief Narrative (HPI from H&P)     67 year old male with history of Charcot-Marie-Tooth disease, NASH cirrhosis, COPD, CAD, HTN, AAA, chronic combined CHF, and DM presented with shortness of breath. He was last admitted from 4/19-21 with a fall at [facility].  He was found to have AKI, a traumatic SDH, and scrotal cellulitis; Plavix was to be held for 2 weeks post-bleed.  He reports that since he returned to his facility he has been feeling very fatigued and SOB.  He has LE edema and stasis changes.  In the ER he was found to be profoundly dehydrated, hypotensive however had evidence of third spacing as well he received IV fluids for blood pressure support and was admitted for further care.   Subjective:   Patient in bed, appears comfortable, denies any headache, no fever, no chest pain or pressure, no shortness of breath , no abdominal pain. No new focal weakness, chronic RLL weakness, he has no headache from time to time however he does see some black spots in both eyes for the last few days.   Assessment  & Plan :   Acute on chronic dyspnea with generalized weakness, due to acute on chronic combined diastolic and systolic CHF of now 45% improved somewhat from before.  Currently not on any O2, chest x-ray with cardiomegaly and pulmonary vascular congestion.  CT chest unremarkable, no PE, mild posterior right upper lobe, middle lobe and lower lobe atelectasis.   He has been diuresed and now actually getting intravascularly depleted with worsening hyponatremia and hypotension with diuretics, repeat echo noted which shows mild improvement in EF as compared to the last echo in 2020.  Continue diuretics PRN as tolerated by his blood pressure, added protein supplementation to augment his blood pressures and  to reduce third spacing.  For now low-dose beta-blocker, if blood pressure holds will entertain Aldactone, Entresto, ACE/ARB.    Chronic combined systolic and diastolic heart failure kindly see #1 above.  Acute on chronic hyponatremia  - patient has cirrhosis and does develop third spacing from time to time, he was adequately diuresed now appears intravascularly depleted, no peripheral edema, no crackles, is becoming hypotensive, will give a trial of IV albumin along with IV fluids, may become a tricky issue due to underlying cirrhosis, hypoproteinemia in combination with underlying CHF.  Recent fall at SNF with Subdural hematoma  -  Most likely traumatic from fall at SNF, CT head here stable, placed to resume Plavix, 2 weeks of hold is over.  Will resume Plavix from 10/08/2022.  Repeat CT scan on 10/01/2022 for some intermittent black spots.  Currently not a candidate for tPA aspirin or Plavix due to recent subdural hematoma less than 10 days ago.   Cellulitis of scrotum - was diagnosed last admission finishing his oral doxycycline.   CAD: Currently stable, Plavix once stable from subdural standpoint, allergic to statin, aspirin discontinued last admission, low-dose beta-blocker added will monitor blood pressure, diuretics for now.  No acute issues.  Hyperlipidemia: Intolerant to statin, continue low-cholesterol diet.  Cirrhosis of liver, unspecified cause - Portal hypertension with splenomegaly, thrombocytopenia and esophageal varices, with some hepatic encephalopathy and elevated ammonia. - Continue diuretics as needed along with scheduled rifaximin along with  lactulose.  Level improving.  Post discharge outpatient follow-up with GI.  Noncompliant with lactulose counseled.   COPD  - Currently stable.  Continue current inhaled regimen.   Anemia of chronic disease, Macrocytosis -  Possibly from chronic illnesses.  Hemoglobin stable.  Monitor intermittently   Thrombocytopenia  - CT from cirrhosis of liver and splenomegaly,     Fall at SNF  - Possibly mechanical.  Fall precautions.  PT eval.  TOC consult, PT/OT to be arranged at SNF.   Tobacco abuse  -Currently on nicotine patch.  Counseled to quit.   Chronic pain  - Continue gabapentin, fentanyl patch and oxycodone as needed   Morbid obesity with BMI of 40 -  follow-up with PCP   Hypotension severe on 09/30/2022 due to dehydration.  Diuretics have been stopped.  Has been hydrated with good effect.  Monitor       Condition - Extremely Guarded  Family Communication  :  called sister [name] [phone]  09/29/2022 at 10:20 AM and message left  Code Status :  DNR  Consults  :  None  PUD Prophylaxis : PPI   Procedures  :     CT - 1. No evidence of pulmonary embolism. 2. Mild posterior right upper lobe, right middle lobe and right lower lobe atelectasis. 3. Mild cardiomegaly with marked severity coronary artery calcification. 4. Cirrhotic liver with marked splenomegaly. 5. Abdominal aortic stenting. 6. Chronic appearing lateral ninth and posterior tenth and eleventh right rib fractures. 7. Aortic atherosclerosis. Aortic Atherosclerosis   TTE -  1. Left ventricular ejection fraction, by estimation, is 45 to 50%. The left ventricle has mildly decreased function. The left ventricle demonstrates global hypokinesis. The left ventricular internal cavity size was moderately dilated. There is mild left ventricular hypertrophy of the basal-septal segment. Left ventricular diastolic parameters are consistent with Grade I diastolic dysfunction (impaired relaxation).  2. Right ventricular systolic function is  normal. The right ventricular size is normal. Tricuspid regurgitation signal is inadequate for assessing PA pressure.  3. The mitral  valve is normal in structure. No evidence of mitral valve regurgitation. No evidence of mitral stenosis.  4. The aortic valve is normal in structure. Aortic valve regurgitation is not visualized. No aortic stenosis is present.  5. The inferior vena cava is normal in size with greater than 50% respiratory variability, suggesting right atrial pressure of 3 mmHg.      Disposition Plan  :    Status is: Inpatient   DVT Prophylaxis  :    Place and maintain sequential compression device Start: 09/30/22 1204 SCDs Start: 09/27/22 1019    Lab Results  Component Value Date   PLT 34 (L) 10/01/2022    Diet :  Diet Order             Diet Heart Room service appropriate? Yes with Assist; Fluid consistency: Thin; Fluid restriction: 1800 mL Fluid  Diet effective now                    Inpatient Medications  Scheduled Meds:  (feeding supplement) PROSource Plus  30 mL Oral BID BM   [START ON 10/08/2022] clopidogrel  75 mg Oral Daily   docusate sodium  100 mg Oral BID   fentaNYL  1 patch Transdermal Q72H   lactulose  30 g Oral TID   metoprolol tartrate  25 mg Oral BID   midodrine  10 mg Oral TID WC   multivitamin with minerals  1 tablet Oral Daily   nicotine  14 mg Transdermal Daily   nystatin   Topical TID   pantoprazole  40 mg Oral Daily   rifaximin  550 mg Oral BID   sodium chloride flush  3 mL Intravenous Q12H   umeclidinium bromide  1 puff Inhalation Daily   Continuous Infusions:   PRN Meds:.acetaminophen **OR** acetaminophen, albuterol, bisacodyl, hydrALAZINE, naLOXone (NARCAN)  injection, ondansetron **OR** ondansetron (ZOFRAN) IV, oxyCODONE, polyethylene glycol    Objective:   Vitals:   09/30/22 1900 09/30/22 2354 10/01/22 0411 10/01/22 0915  BP: (!) 99/48 (!) 103/52 (!) 112/57 (!) 98/55  Pulse: 76 69 74 77  Resp: 18 16 20  (!) 22  Temp:  98.8 F (37.1 C) 98.2 F (36.8 C) 98.8 F (37.1 C) 97.8 F (36.6 C)  TempSrc: Oral Oral Oral Oral  SpO2: 93% 94% 95% 94%  Weight:      Height:        Wt Readings from Last 3 Encounters:  09/28/22 127 kg  09/24/22 131.6 kg  07/08/22 (!) 143.8 kg     Intake/Output Summary (Last 24 hours) at 10/01/2022 0924 Last data filed at 10/01/2022 0600 Gross per 24 hour  Intake --  Output 1450 ml  Net -1450 ml     Physical Exam  Awake Alert, No new F.N deficits, chronic right lower extremity weakness [place].AT,PERRAL Supple Neck, No JVD,   Symmetrical Chest wall movement, Good air movement bilaterally, CTAB RRR,No Gallops,Rubs or new Murmurs,  +ve B.Sounds, Abd Soft, No tenderness,   No Cyanosis, no  lower extremity edema      Data Review:    Recent Labs  Lab 09/27/22 0005 09/28/22 0155 09/29/22 0548 09/30/22 0314 10/01/22 0808  WBC 9.3 7.3 7.7 8.7 6.8  HGB 11.6* 11.3* 10.7* 11.3* 10.1*  HCT 33.5* 31.9* 31.9* 33.4* 29.4*  PLT 42* 41* 38* 40* 34*  MCV 102.4* 102.6* 106.0* 105.7* 105.4*  MCH 35.5* 36.3* 35.5* 35.8* 36.2*  MCHC 34.6 35.4 33.5 33.8 34.4  RDW 16.2* 16.5* 16.2* 16.3* 16.4*  LYMPHSABS  0.7  --   --  0.9 0.7  MONOABS 0.9  --   --  0.7 0.5  EOSABS 0.1  --   --  0.2 0.2  BASOSABS 0.0  --   --  0.1 0.0    Recent Labs  Lab 09/26/22 2147 09/27/22 0005 09/28/22 0155 09/28/22 1630 09/29/22 0548 09/30/22 0314 10/01/22 0808  NA  --  125* 130*  --  132* 128* 131*  K  --  3.2* 3.3*  --  4.2 4.3 3.9  CL  --  90* 92*  --  94* 95* 98  CO2  --  26 29  --  29 30 27   ANIONGAP  --  9 9  --  9 3* 6  GLUCOSE  --  133* 111*  --  129* 164* 124*  BUN  --  34* 33*  --  21 31* 30*  CREATININE  --  1.45* 1.20  --  1.05 1.10 1.08  AST  --  55*  --   --  49* 55* 49*  ALT  --  33  --   --  30 34 31  ALKPHOS  --  191*  --   --  148* 160* 145*  BILITOT  --  3.1*  --   --  3.0* 2.9* 2.3*  ALBUMIN  --  2.2*  --   --  2.0* 2.0* 1.9*  AMMONIA  --  70*  --  120* 74* 73*  --   BNP  61.3  --  102.5*  --  99.1 106.3*  --   MG  --   --   --   --  2.0 2.2 2.2  CALCIUM  --  8.1* 8.1*  --  8.5* 8.4* 8.2*      Recent Labs  Lab 09/26/22 2147 09/27/22 0005 09/28/22 0155 09/28/22 1630 09/29/22 0548 09/30/22 0314 10/01/22 0808  AMMONIA  --  70*  --  120* 74* 73*  --   BNP 61.3  --  102.5*  --  99.1 106.3*  --   MG  --   --   --   --  2.0 2.2 2.2  CALCIUM  --  8.1* 8.1*  --  8.5* 8.4* 8.2*    Lab Results  Component Value Date   HGBA1C 5.3 01/31/2022      Radiology Reports DG Chest Port 1 View  Result Date: 09/30/2022 CLINICAL DATA:  Shortness of breath. EXAM: PORTABLE CHEST 1 VIEW COMPARISON:  09/29/2022 FINDINGS: The cardio pericardial silhouette is enlarged. There is pulmonary vascular congestion without overt pulmonary edema. Low lung volumes. No focal consolidation overt edema, or pleural effusion. Left AICD again noted. Bones are diffusely demineralized. Telemetry leads overlie the chest. IMPRESSION: Stable.  Cardiomegaly with vascular congestion. Electronically Signed   By: Kennith Center M.D.   On: 09/30/2022 12:50   DG Chest Port 1 View  Result Date: 09/29/2022 CLINICAL DATA:  Shortness of breath. EXAM: PORTABLE CHEST 1 VIEW COMPARISON:  09/26/2022 FINDINGS: The cardio pericardial silhouette is enlarged. Low lung volumes. There is pulmonary vascular congestion without overt pulmonary edema. No overt pulmonary edema, focal airspace consolidation, or pleural effusion. Left-sided AICD again noted. IMPRESSION: Low volume film with vascular congestion. Electronically Signed   By: Kennith Center M.D.   On: 09/29/2022 07:16   ECHOCARDIOGRAM COMPLETE  Result Date: 09/28/2022    ECHOCARDIOGRAM REPORT   Patient Name:   CHI GARLOW Date of Exam: 09/28/2022 Medical Rec #:  [id number]          Height:       70.0 in Accession #:    [id number]         Weight:       280.0 lb Date of Birth:  Oct 09, 1955          BSA:          2.408 m Patient Age:    2 years           BP:            120/92 mmHg Patient Gender: M                  HR:           83 bpm. Exam Location:  Inpatient Procedure: 2D Echo, Color Doppler and Cardiac Doppler Indications:    acute systolic chf  History:        Patient has prior history of Echocardiogram examinations, most                 recent 10/30/2018. CAD, COPD and chronic kidney disease.                 Cirrhosis., Signs/Symptoms:Edema; Risk Factors:Hypertension,                 Dyslipidemia, Diabetes and Current Smoker.  Sonographer:    Delcie Roch RDCS Referring Phys: [id number] RIPUDEEP K RAI  Sonographer Comments: Patient is obese. Image acquisition challenging due to patient body habitus. IMPRESSIONS  1. Left ventricular ejection fraction, by estimation, is 45 to 50%. The left ventricle has mildly decreased function. The left ventricle demonstrates global hypokinesis. The left ventricular internal cavity size was moderately dilated. There is mild left ventricular hypertrophy of the basal-septal segment. Left ventricular diastolic parameters are consistent with Grade I diastolic dysfunction (impaired relaxation).  2. Right ventricular systolic function is normal. The right ventricular size is normal. Tricuspid regurgitation signal is inadequate for assessing PA pressure.  3. The mitral valve is normal in structure. No evidence of mitral valve regurgitation. No evidence of mitral stenosis.  4. The aortic valve is normal in structure. Aortic valve regurgitation is not visualized. No aortic stenosis is present.  5. The inferior vena cava is normal in size with greater than 50% respiratory variability, suggesting right atrial pressure of 3 mmHg. FINDINGS  Left Ventricle: Left ventricular ejection fraction, by estimation, is 45 to 50%. The left ventricle has mildly decreased function. The left ventricle demonstrates global hypokinesis. The left ventricular internal cavity size was moderately dilated. There is mild left ventricular hypertrophy of the basal-septal  segment. Left ventricular diastolic parameters are consistent with Grade I diastolic dysfunction (impaired relaxation). Normal left ventricular filling pressure. Right Ventricle: The right ventricular size is normal. No increase in right ventricular wall thickness. Right ventricular systolic function is normal. Tricuspid regurgitation signal is inadequate for assessing PA pressure. Left Atrium: Left atrial size was normal in size. Right Atrium: Right atrial size was normal in size. Pericardium: There is no evidence of pericardial effusion. Mitral Valve: The mitral valve is normal in structure. Mild to moderate mitral annular calcification. No evidence of mitral valve regurgitation. No evidence of mitral valve stenosis. Tricuspid Valve: The tricuspid valve is normal in structure. Tricuspid valve regurgitation is not demonstrated. No evidence of tricuspid stenosis. Aortic Valve: The aortic valve is normal in structure. Aortic valve regurgitation is not visualized. No aortic stenosis is present. Pulmonic Valve: The pulmonic valve was normal  in structure. Pulmonic valve regurgitation is not visualized. No evidence of pulmonic stenosis. Aorta: The aortic root is normal in size and structure. Venous: The inferior vena cava is normal in size with greater than 50% respiratory variability, suggesting right atrial pressure of 3 mmHg. IAS/Shunts: No atrial level shunt detected by color flow Doppler. Additional Comments: A device lead is visualized.  LEFT VENTRICLE PLAX 2D LVIDd:         6.50 cm      Diastology LVIDs:         4.80 cm      LV e' medial:    5.33 cm/s LV PW:         1.10 cm      LV E/e' medial:  17.0 LV IVS:        1.20 cm      LV e' lateral:   9.68 cm/s LVOT diam:     2.00 cm      LV E/e' lateral: 9.4 LV SV:         65 LV SV Index:   27 LVOT Area:     3.14 cm  LV Volumes (MOD) LV vol d, MOD A4C: 111.0 ml LV vol s, MOD A4C: 59.5 ml LV SV MOD A4C:     111.0 ml RIGHT VENTRICLE             IVC RV S prime:     18.30  cm/s  IVC diam: 1.90 cm TAPSE (M-mode): 3.0 cm LEFT ATRIUM             Index LA diam:        4.40 cm 1.83 cm/m LA Vol (A2C):   78.8 ml 32.72 ml/m LA Vol (A4C):   64.2 ml 26.66 ml/m LA Biplane Vol: 72.0 ml 29.90 ml/m  AORTIC VALVE LVOT Vmax:   122.00 cm/s LVOT Vmean:  80.100 cm/s LVOT VTI:    0.207 m  AORTA Ao Root diam: 3.20 cm Ao Asc diam:  3.40 cm MITRAL VALVE MV Area (PHT): 3.27 cm     SHUNTS MV Decel Time: 232 msec     Systemic VTI:  0.21 m MV E velocity: 90.70 cm/s   Systemic Diam: 2.00 cm MV A velocity: 113.00 cm/s MV E/A ratio:  0.80 Armanda Magic MD Electronically signed by Armanda Magic MD Signature Date/Time: 09/28/2022/7:33:40 PM    Final    CT HEAD WO CONTRAST ( )  Result Date: 09/27/2022 CLINICAL DATA:  Follow-up subdural hematoma EXAM: CT HEAD WITHOUT CONTRAST TECHNIQUE: Contiguous axial images were obtained from the base of the skull through the vertex without intravenous contrast. RADIATION DOSE REDUCTION: This exam was performed according to the departmental dose-optimization program which includes automated exposure control, adjustment of the mA and/or kV according to patient size and/or use of iterative reconstruction technique. COMPARISON:  09/23/2022 FINDINGS: Brain: Again noted is the left para falcine subdural hematoma. This measures 5 mm in thickness compared to 6 mm previously. Overall, the size of the subdural hematoma appears to have decreased. Subdural hematoma along the posterior falx measures 4 mm, stable since prior study. No new areas of hemorrhage. No infarct or hydrocephalus. Mild cerebral atrophy. Vascular: No hyperdense vessel or unexpected calcification. Skull: No acute calvarial abnormality. Sinuses/Orbits: No acute findings Other: None IMPRESSION: Left parafalcine subdural hematoma again noted. The superior component appears to have slightly decreased in size wall the posterior component is stable. No new areas of hemorrhage. Electronically Signed   By: Charlett Nose  M.D.  On: 09/27/2022 18:19      Signature  -   Susa Raring M.D on 10/01/2022 at 9:24 AM   -  To page go to [email or web address]

## 2022-10-02 ENCOUNTER — Inpatient Hospital Stay (HOSPITAL_COMMUNITY): Payer: Medicare HMO

## 2022-10-02 DIAGNOSIS — R0609 Other forms of dyspnea: Secondary | ICD-10-CM | POA: Diagnosis not present

## 2022-10-02 LAB — COMPREHENSIVE METABOLIC PANEL
ALT: 50 U/L — ABNORMAL HIGH (ref 0–44)
AST: 85 U/L — ABNORMAL HIGH (ref 15–41)
Albumin: 2.1 g/dL — ABNORMAL LOW (ref 3.5–5.0)
Alkaline Phosphatase: 161 U/L — ABNORMAL HIGH (ref 38–126)
Anion gap: 6 (ref 5–15)
BUN: 24 mg/dL — ABNORMAL HIGH (ref 8–23)
CO2: 26 mmol/L (ref 22–32)
Calcium: 8 mg/dL — ABNORMAL LOW (ref 8.9–10.3)
Chloride: 98 mmol/L (ref 98–111)
Creatinine, Ser: 0.93 mg/dL (ref 0.61–1.24)
GFR, Estimated: >60 mL/min (ref >60)
Glucose, Bld: 101 mg/dL — ABNORMAL HIGH (ref 70–99)
Potassium: 4.2 mmol/L (ref 3.5–5.1)
Sodium: 130 mmol/L — ABNORMAL LOW (ref 135–145)
Total Bilirubin: 3.2 mg/dL — ABNORMAL HIGH (ref 0.3–1.2)
Total Protein: 7.3 g/dL (ref 6.5–8.1)

## 2022-10-02 LAB — CBC WITH DIFFERENTIAL/PLATELET
Abs Immature Granulocytes: 0.06 10*3/uL (ref 0.00–0.07)
Basophils Absolute: 0 10*3/uL (ref 0.0–0.1)
Basophils Relative: 0 %
Eosinophils Absolute: 0.2 10*3/uL (ref 0.0–0.5)
Eosinophils Relative: 3 %
HCT: 30.7 % — ABNORMAL LOW (ref 39.0–52.0)
Hemoglobin: 10.6 g/dL — ABNORMAL LOW (ref 13.0–17.0)
Immature Granulocytes: 1 %
Lymphocytes Relative: 9 %
Lymphs Abs: 0.6 10*3/uL — ABNORMAL LOW (ref 0.7–4.0)
MCH: 36.3 pg — ABNORMAL HIGH (ref 26.0–34.0)
MCHC: 34.5 g/dL (ref 30.0–36.0)
MCV: 105.1 fL — ABNORMAL HIGH (ref 80.0–100.0)
Monocytes Absolute: 0.5 10*3/uL (ref 0.1–1.0)
Monocytes Relative: 8 %
Neutro Abs: 5.3 10*3/uL (ref 1.7–7.7)
Neutrophils Relative %: 79 %
Platelets: 31 10*3/uL — ABNORMAL LOW (ref 150–400)
RBC: 2.92 MIL/uL — ABNORMAL LOW (ref 4.22–5.81)
RDW: 16.4 % — ABNORMAL HIGH (ref 11.5–15.5)
WBC: 6.7 10*3/uL (ref 4.0–10.5)
nRBC: 0 % (ref 0.0–0.2)

## 2022-10-02 LAB — MAGNESIUM: Magnesium: 2 mg/dL (ref 1.7–2.4)

## 2022-10-02 LAB — AMMONIA: Ammonia: 51 umol/L — ABNORMAL HIGH (ref 9–35)

## 2022-10-02 LAB — BRAIN NATRIURETIC PEPTIDE: B Natriuretic Peptide: 217.6 pg/mL — ABNORMAL HIGH (ref 0.0–100.0)

## 2022-10-02 MED ORDER — LACTATED RINGERS IV SOLN: INTRAVENOUS | Status: AC

## 2022-10-02 MED ORDER — ALBUMIN HUMAN 25 % IV SOLN
50.0000 g | Freq: Once | INTRAVENOUS | Status: AC
  Administered 2022-10-02: 50 g via INTRAVENOUS
  Filled 2022-10-02: qty 200

## 2022-10-02 MED ORDER — MELATONIN 5 MG PO TABS
5.0000 mg | ORAL_TABLET | Freq: Every evening | ORAL | Status: DC | PRN
  Administered 2022-10-02: 5 mg via ORAL
  Filled 2022-10-02: qty 1

## 2022-10-02 MED ORDER — IPRATROPIUM-ALBUTEROL 0.5-2.5 (3) MG/3ML IN SOLN: 3.0000 mL | Freq: Once | RESPIRATORY_TRACT | Status: DC

## 2022-10-02 NOTE — TOC Progression Note (Signed)
Transition of Care [facility]) - Progression Note    Patient Details  Name: [name] MRN: [id number] Date of Birth: [birth year]  Transition of Care [facility]) CM/SW Contact  Mearl Latin, LCSW Phone Number: 10/02/2022, 4:14 PM  Clinical Narrative:    CSW made [facility] aware patient is not yet ready for discharge.    Expected Discharge Plan: Skilled Nursing Facility Barriers to Discharge: Continued Medical Work up  Expected Discharge Plan and Services     Post Acute Care Choice: Skilled Nursing Facility Living arrangements for the past 2 months: Skilled Nursing Facility                                       Social Determinants of Health (SDOH) Interventions SDOH Screenings   Food Insecurity: No Food Insecurity (09/28/2022)  Housing: Low Risk  (09/28/2022)  Transportation Needs: No Transportation Needs (09/28/2022)  Utilities: Not At Risk (09/28/2022)  Depression (PHQ2-9): High Risk (10/04/2021)  Financial Resource Strain: High Risk (12/27/2020)  Physical Activity: Unknown (02/20/2018)  Social Connections: Unknown (02/20/2018)  Tobacco Use: High Risk (09/28/2022)    Readmission Risk Interventions    10/02/2022    2:41 PM  Readmission Risk Prevention Plan  Transportation Screening Complete  Medication Review (RN Care Manager) Complete  HRI or Home Care Consult Complete  SW Recovery Care/Counseling Consult Complete  Palliative Care Screening Not Applicable  Skilled Nursing Facility Complete

## 2022-10-02 NOTE — Care Management Important Message (Signed)
Important Message  Patient Details  Name: [name] MRN: [id number] Date of Birth: 26-Dec-1955   Medicare Important Message Given:  Yes     [name] 10/02/2022, 3:32 PM

## 2022-10-02 NOTE — Progress Notes (Signed)
Physical Therapy Treatment Patient Details Name: [name] MRN: [id number] DOB: 1956-01-29 Today's Date: 10/02/2022   History of Present Illness Pt is a 67 y.o. M who presents 09/26/2022 with fatigue and shortness of breath from [facility] where he is a resident. + fluid overload, stasis changes and hyponatremia. CT scan showed 4 mm SDH. Significant PMH: Admitted 4/19-4/21/24 with SDH likely from traumatic fall, cirrhosis, COPD, CAD with MI, HTN, AAA, CHF, T2DM, spinal stenosis.    PT Comments    Pt tolerated today's session well but was limited in standing tolerance due to pain in his back. Pt stood from chair x3 trials with moderate encouragement, cueing for upright posture and decreased trunk flexion, however pt unable to tolerate >1 minute in standing. Pt educated on pushing up from chair to stand, however cueing required with each rep as pt continuing to place B hands on the RW. Pt declined further mobility after 3 stands due to pain, but acute PT will continue to follow as appropriate, discharge recommendations remain appropriate.     Recommendations for follow up therapy are one component of a multi-disciplinary discharge planning process, led by the attending physician.  Recommendations may be updated based on patient status, additional functional criteria and insurance authorization.  Follow Up Recommendations  Can patient physically be transported by private vehicle: No    Assistance Recommended at Discharge Frequent or constant Supervision/Assistance  Patient can return home with the following A lot of help with bathing/dressing/bathroom;Assistance with cooking/housework;Direct supervision/assist for medications management;Assist for transportation;Two people to help with walking and/or transfers   Equipment Recommendations  None recommended by PT    Recommendations for Other Services       Precautions / Restrictions Precautions Precautions:  Fall Restrictions Weight Bearing Restrictions: No     Mobility  Bed Mobility Overal bed mobility: Needs Assistance             General bed mobility comments: pt in chair upon arrival    Transfers Overall transfer level: Needs assistance Equipment used: Rolling walker (2 wheels) Transfers: Sit to/from Stand Sit to Stand: Min guard           General transfer comment: standing from chair x3 attempts with increased time and cueing for proper hand placement    Ambulation/Gait               General Gait Details: pt declining ambulation, able to step in place x1   Stairs             Wheelchair Mobility    Modified Rankin (Stroke Patients Only)       Balance Overall balance assessment: Needs assistance Sitting-balance support: No upper extremity supported, Feet supported Sitting balance-Leahy Scale: Fair     Standing balance support: Bilateral upper extremity supported, Reliant on assistive device for balance Standing balance-Leahy Scale: Poor Standing balance comment: reliant on RW during standing                            Cognition Arousal/Alertness: Awake/alert Behavior During Therapy: WFL for tasks assessed/performed Overall Cognitive Status: No family/caregiver present to determine baseline cognitive functioning Area of Impairment: Memory, Awareness, Problem solving, Safety/judgement                     Memory: Decreased short-term memory   Safety/Judgement: Decreased awareness of safety, Decreased awareness of deficits Awareness: Emergent Problem Solving: Requires verbal cues, Decreased initiation, Difficulty sequencing General  Comments: cueing for proper technique of transfers required with each repetition        Exercises      General Comments General comments (skin integrity, edema, etc.): VSS on 2L O2 Canon      Pertinent Vitals/Pain Pain Assessment Pain Assessment: Faces Faces Pain Scale: Hurts even  more Pain Location: back with standing Pain Descriptors / Indicators: Discomfort, Sore Pain Intervention(s): Limited activity within patient's tolerance, Monitored during session, Repositioned    Home Living                          Prior Function            PT Goals (current goals can now be found in the care plan section) Acute Rehab PT Goals Patient Stated Goal: less pain PT Goal Formulation: With patient Time For Goal Achievement: 10/12/22 Potential to Achieve Goals: Fair Progress towards PT goals: Progressing toward goals    Frequency    Min 2X/week      PT Plan Current plan remains appropriate    Co-evaluation              AM-PAC PT "6 Clicks" Mobility   Outcome Measure  Help needed turning from your back to your side while in a flat bed without using bedrails?: A Lot Help needed moving from lying on your back to sitting on the side of a flat bed without using bedrails?: A Lot Help needed moving to and from a bed to a chair (including a wheelchair)?: A Lot Help needed standing up from a chair using your arms (e.g., wheelchair or bedside chair)?: A Little Help needed to walk in hospital room?: Total Help needed climbing 3-5 steps with a railing? : Total 6 Click Score: 11    End of Session   Activity Tolerance: Patient limited by pain Patient left: in chair;with call bell/phone within reach;with chair alarm set Nurse Communication: Mobility status PT Visit Diagnosis: Unsteadiness on feet (R26.81);History of falling (Z91.81);Muscle weakness (generalized) (M62.81)     Time: 1610-9604 PT Time Calculation (min) (ACUTE ONLY): 14 min  Charges:  $Therapeutic Activity: 8-22 mins                     [name], [name] Acute Rehabilitation Services Office [phone]    [name] 10/02/2022, 4:07 PM

## 2022-10-02 NOTE — Progress Notes (Signed)
PROGRESS NOTE                                                                                                                                                                                                             Patient Demographics:    [name], is a 67 y.o. male, DOB - [birth year], ZOX:[id number]  Outpatient Primary MD for the patient is [name], MD    LOS - 4  Admit date - 09/26/2022    Chief Complaint  Patient presents with   Shortness of Breath       Brief Narrative (HPI from H&P)     67 year old male with history of Charcot-Marie-Tooth disease, NASH cirrhosis, COPD, CAD, HTN, AAA, chronic combined CHF, and DM presented with shortness of breath. He was last admitted from 4/19-21 with a fall at [facility].  He was found to have AKI, a traumatic SDH, and scrotal cellulitis; Plavix was to be held for 2 weeks post-bleed.  He reports that since he returned to his facility he has been feeling very fatigued and SOB.  He has LE edema and stasis changes.  In the ER he was found to be profoundly dehydrated, hypotensive however had evidence of third spacing as well he received IV fluids for blood pressure support and was admitted for further care.   Subjective:   Patient in bed, appears comfortable, denies any headache, no fever, no chest pain or pressure, no shortness of breath , no abdominal pain. No new focal weakness.   Assessment  & Plan :   Acute on chronic dyspnea with generalized weakness, due to acute on chronic combined diastolic and systolic CHF of now 45% improved somewhat from before.  Currently not on any O2, chest x-ray with cardiomegaly and pulmonary vascular congestion.  CT chest unremarkable, no PE, mild posterior right upper lobe, middle lobe and lower lobe atelectasis.  He has been diuresed and now actually getting intravascularly depleted with worsening hyponatremia and hypotension with diuretics,  repeat echo noted which shows mild improvement in EF as compared to the last echo in 2020.    Continue diuretics PRN as tolerated by his blood pressure, added protein supplementation to augment his blood pressures and to reduce third spacing.  For now low-dose beta-blocker, if blood pressure holds will entertain Aldactone, Entresto, ACE/ARB.    Chronic combined systolic  and diastolic heart failure kindly see #1 above.  Sleep dehydrated on 10/02/2022.   Recent fall at SNF with Subdural hematoma  -  Most likely traumatic from fall at SNF, CT head here stable, placed to resume Plavix, 2 weeks of hold is over.  Will resume Plavix from 10/08/2022.  Repeat CT scan on 10/01/2022 for some intermittent black spots.  Currently not a candidate for tPA aspirin or Plavix due to recent subdural hematoma less than 10 days ago.  Symptoms have resolved repeat CT scan on 10/01/2022 of the head was again nonacute and unchanged.   Cellulitis of scrotum - was diagnosed last admission finishing his oral doxycycline.   CAD: Currently stable, Plavix once stable from subdural standpoint, allergic to statin, aspirin discontinued last admission, low-dose beta-blocker added will monitor blood pressure, diuretics for now.  No acute issues.  Hyperlipidemia: Intolerant to statin, continue low-cholesterol diet.  Cirrhosis of liver, unspecified cause - Portal hypertension with splenomegaly, thrombocytopenia and esophageal varices, with some hepatic encephalopathy and elevated ammonia. - Continue diuretics as needed along with scheduled rifaximin along with lactulose.  Level improving.  Post discharge outpatient follow-up with GI.  Noncompliant with lactulose counseled.   COPD  - Currently stable.  Continue current inhaled regimen.   Anemia of chronic disease, Macrocytosis -  Possibly from chronic illnesses.  Hemoglobin stable.  Monitor intermittently   Thrombocytopenia  - CT from cirrhosis of liver and splenomegaly,     Fall at  SNF  - Possibly mechanical.  Fall precautions.  PT eval.  TOC consult, PT/OT to be arranged at SNF.   Tobacco abuse  -Currently on nicotine patch.  Counseled to quit.   Chronic pain  - Continue gabapentin, fentanyl patch and oxycodone as needed   Morbid obesity with BMI of 40 -  follow-up with PCP   Hyponatremia, hypotension severe on 09/30/2022 due to dehydration.  Diuretics have been stopped his H&H has gone up and clinically appears severely dehydrated, due to underlying cirrhosis and low albumin levels he easily third spaces, trial of albumin with IV fluids repeat on 10/02/2022.  Hyponatremia and blood pressure improving.       Condition - Extremely Guarded  Family Communication  :  called sister [name] [phone]  09/29/2022 at 10:20 AM and message left  Code Status :  DNR  Consults  :  None  PUD Prophylaxis : PPI   Procedures  :     CT - 1. No evidence of pulmonary embolism. 2. Mild posterior right upper lobe, right middle lobe and right lower lobe atelectasis. 3. Mild cardiomegaly with marked severity coronary artery calcification. 4. Cirrhotic liver with marked splenomegaly. 5. Abdominal aortic stenting. 6. Chronic appearing lateral ninth and posterior tenth and eleventh right rib fractures. 7. Aortic atherosclerosis. Aortic Atherosclerosis   TTE -  1. Left ventricular ejection fraction, by estimation, is 45 to 50%. The left ventricle has mildly decreased function. The left ventricle demonstrates global hypokinesis. The left ventricular internal cavity size was moderately dilated. There is mild left ventricular hypertrophy of the basal-septal segment. Left ventricular diastolic parameters are consistent with Grade I diastolic dysfunction (impaired relaxation).  2. Right ventricular systolic function is normal. The right ventricular size is normal. Tricuspid regurgitation signal is inadequate for assessing PA pressure.  3. The mitral valve is normal in structure. No evidence of mitral  valve regurgitation. No evidence of mitral stenosis.  4. The aortic valve is normal in structure. Aortic valve regurgitation is not visualized. No aortic  stenosis is present.  5. The inferior vena cava is normal in size with greater than 50% respiratory variability, suggesting right atrial pressure of 3 mmHg.      Disposition Plan  :    Status is: Inpatient   DVT Prophylaxis  :    Place and maintain sequential compression device Start: 09/30/22 1204 SCDs Start: 09/27/22 1019    Lab Results  Component Value Date   PLT 31 (L) 10/02/2022    Diet :  Diet Order             Diet Heart Room service appropriate? Yes with Assist; Fluid consistency: Thin; Fluid restriction: 1200 mL Fluid  Diet effective now                    Inpatient Medications  Scheduled Meds:  (feeding supplement) PROSource Plus  30 mL Oral BID BM   [START ON 10/08/2022] clopidogrel  75 mg Oral Daily   docusate sodium  100 mg Oral BID   fentaNYL  1 patch Transdermal Q72H   ipratropium-albuterol  3 mL Nebulization Once   lactulose  30 g Oral TID   metoprolol tartrate  25 mg Oral BID   midodrine  10 mg Oral TID WC   multivitamin with minerals  1 tablet Oral Daily   nicotine  14 mg Transdermal Daily   nystatin   Topical TID   pantoprazole  40 mg Oral Daily   rifaximin  550 mg Oral BID   sodium chloride flush  3 mL Intravenous Q12H   umeclidinium bromide  1 puff Inhalation Daily   Continuous Infusions:  albumin human     lactated ringers      PRN Meds:.acetaminophen **OR** acetaminophen, albuterol, bisacodyl, hydrALAZINE, naLOXone (NARCAN)  injection, ondansetron **OR** ondansetron (ZOFRAN) IV, oxyCODONE, polyethylene glycol    Objective:   Vitals:   10/02/22 0307 10/02/22 0810 10/02/22 0827 10/02/22 0828  BP: 124/65 (!) 92/54    Pulse: 83 88    Resp: 14 16    Temp: 98.1 F (36.7 C) 98 F (36.7 C)    TempSrc: Oral Oral    SpO2: 94%  94% 94%  Weight:      Height:        Wt Readings from  Last 3 Encounters:  09/28/22 127 kg  09/24/22 131.6 kg  07/08/22 (!) 143.8 kg     Intake/Output Summary (Last 24 hours) at 10/02/2022 0908 Last data filed at 10/02/2022 0814 Gross per 24 hour  Intake --  Output 1800 ml  Net -1800 ml     Physical Exam  Awake Alert, No new F.N deficits, chronic right lower extremity weakness Solomon.AT,PERRAL Supple Neck, No JVD,   Symmetrical Chest wall movement, Good air movement bilaterally, CTAB RRR,No Gallops,Rubs or new Murmurs,  +ve B.Sounds, Abd Soft, No tenderness,   No Cyanosis, no  lower extremity edema      Data Review:    Recent Labs  Lab 09/27/22 0005 09/28/22 0155 09/29/22 0548 09/30/22 0314 10/01/22 0808 10/02/22 0533  WBC 9.3 7.3 7.7 8.7 6.8 6.7  HGB 11.6* 11.3* 10.7* 11.3* 10.1* 10.6*  HCT 33.5* 31.9* 31.9* 33.4* 29.4* 30.7*  PLT 42* 41* 38* 40* 34* 31*  MCV 102.4* 102.6* 106.0* 105.7* 105.4* 105.1*  MCH 35.5* 36.3* 35.5* 35.8* 36.2* 36.3*  MCHC 34.6 35.4 33.5 33.8 34.4 34.5  RDW 16.2* 16.5* 16.2* 16.3* 16.4* 16.4*  LYMPHSABS 0.7  --   --  0.9 0.7 0.6*  MONOABS 0.9  --   --  0.7 0.5 0.5  EOSABS 0.1  --   --  0.2 0.2 0.2  BASOSABS 0.0  --   --  0.1 0.0 0.0    Recent Labs  Lab 09/27/22 0005 09/28/22 0155 09/28/22 1630 09/29/22 0548 09/30/22 0314 10/01/22 0808 10/02/22 0533 10/02/22 0557  NA 125* 130*  --  132* 128* 131* 130*  --   K 3.2* 3.3*  --  4.2 4.3 3.9 4.2  --   CL 90* 92*  --  94* 95* 98 98  --   CO2 26 29  --  29 30 27 26   --   ANIONGAP 9 9  --  9 3* 6 6  --   GLUCOSE 133* 111*  --  129* 164* 124* 101*  --   BUN 34* 33*  --  21 31* 30* 24*  --   CREATININE 1.45* 1.20  --  1.05 1.10 1.08 0.93  --   AST 55*  --   --  49* 55* 49* 85*  --   ALT 33  --   --  30 34 31 50*  --   ALKPHOS 191*  --   --  148* 160* 145* 161*  --   BILITOT 3.1*  --   --  3.0* 2.9* 2.3* 3.2*  --   ALBUMIN 2.2*  --   --  2.0* 2.0* 1.9* 2.1*  --   AMMONIA 70*  --  120* 74* 73*  --   --  51*  BNP  --  102.5*  --  99.1 106.3*  281.1* 217.6*  --   MG  --   --   --  2.0 2.2 2.2 2.0  --   CALCIUM 8.1* 8.1*  --  8.5* 8.4* 8.2* 8.0*  --       Recent Labs  Lab 09/27/22 0005 09/28/22 0155 09/28/22 1630 09/29/22 0548 09/30/22 0314 10/01/22 0808 10/02/22 0533 10/02/22 0557  AMMONIA 70*  --  120* 74* 73*  --   --  51*  BNP  --  102.5*  --  99.1 106.3* 281.1* 217.6*  --   MG  --   --   --  2.0 2.2 2.2 2.0  --   CALCIUM 8.1* 8.1*  --  8.5* 8.4* 8.2* 8.0*  --     Lab Results  Component Value Date   HGBA1C 5.3 01/31/2022      Radiology Reports DG Chest Port 1 View  Result Date: 10/02/2022 CLINICAL DATA:  Shortness of breath. EXAM: PORTABLE CHEST 1 VIEW COMPARISON:  09/30/2022 FINDINGS: Stable cardiac enlargement and appearance single lead pacing/ICD device. Improved aeration of the lungs with no further suggestion interstitial edema or overt airspace edema. There is no evidence of focal airspace consolidation, pneumothorax, nodule or pleural fluid. IMPRESSION: Stable cardiac enlargement. Improved aeration of the lungs. No acute findings. Electronically Signed   By: Irish Lack M.D.   On: 10/02/2022 07:58   CT HEAD WO CONTRAST ( )  Result Date: 10/01/2022 CLINICAL DATA:  Stroke follow-up EXAM: CT HEAD WITHOUT CONTRAST TECHNIQUE: Contiguous axial images were obtained from the base of the skull through the vertex without intravenous contrast. RADIATION DOSE REDUCTION: This exam was performed according to the departmental dose-optimization program which includes automated exposure control, adjustment of the mA and/or kV according to patient size and/or use of iterative reconstruction technique. COMPARISON:  Head CT from 4 days ago FINDINGS: Brain: A subdural hematoma along the left paramedian falx measures up to 5 mm,  non progressed. No infarct or new site of hemorrhage. Stable subcentimeter lipoma in near the tip of the basilar. Vascular: No hyperdense vessel or unexpected calcification. Skull: Negative for  fracture Sinuses/Orbits: No acute finding Other: Parotid nodules including partially covered 13 mm nodule on the right. Bilateral parotid masses are known from prior dedicated imaging, reference neck CT 08/23/2020. IMPRESSION: Unchanged left parafalcine subdural hematoma measuring up to 5 mm in thickness. Electronically Signed   By: Tiburcio Pea M.D.   On: 10/01/2022 10:44   DG Chest Port 1 View  Result Date: 09/30/2022 CLINICAL DATA:  Shortness of breath. EXAM: PORTABLE CHEST 1 VIEW COMPARISON:  09/29/2022 FINDINGS: The cardio pericardial silhouette is enlarged. There is pulmonary vascular congestion without overt pulmonary edema. Low lung volumes. No focal consolidation overt edema, or pleural effusion. Left AICD again noted. Bones are diffusely demineralized. Telemetry leads overlie the chest. IMPRESSION: Stable.  Cardiomegaly with vascular congestion. Electronically Signed   By: Kennith Center M.D.   On: 09/30/2022 12:50   DG Chest Port 1 View  Result Date: 09/29/2022 CLINICAL DATA:  Shortness of breath. EXAM: PORTABLE CHEST 1 VIEW COMPARISON:  09/26/2022 FINDINGS: The cardio pericardial silhouette is enlarged. Low lung volumes. There is pulmonary vascular congestion without overt pulmonary edema. No overt pulmonary edema, focal airspace consolidation, or pleural effusion. Left-sided AICD again noted. IMPRESSION: Low volume film with vascular congestion. Electronically Signed   By: Kennith Center M.D.   On: 09/29/2022 07:16   ECHOCARDIOGRAM COMPLETE  Result Date: 09/28/2022    ECHOCARDIOGRAM REPORT   Patient Name:   YISROEL MULLENDORE Date of Exam: 09/28/2022 Medical Rec #:  [id number]          Height:       70.0 in Accession #:    [id number]         Weight:       280.0 lb Date of Birth:  [birth year]          BSA:          2.408 m Patient Age:    60 years           BP:           120/92 mmHg Patient Gender: M                  HR:           83 bpm. Exam Location:  Inpatient Procedure: 2D Echo, Color  Doppler and Cardiac Doppler Indications:    acute systolic chf  History:        Patient has prior history of Echocardiogram examinations, most                 recent 10/30/2018. CAD, COPD and chronic kidney disease.                 Cirrhosis., Signs/Symptoms:Edema; Risk Factors:Hypertension,                 Dyslipidemia, Diabetes and Current Smoker.  Sonographer:    Delcie Roch RDCS Referring Phys: [id number] RIPUDEEP K RAI  Sonographer Comments: Patient is obese. Image acquisition challenging due to patient body habitus. IMPRESSIONS  1. Left ventricular ejection fraction, by estimation, is 45 to 50%. The left ventricle has mildly decreased function. The left ventricle demonstrates global hypokinesis. The left ventricular internal cavity size was moderately dilated. There is mild left ventricular hypertrophy of the basal-septal segment. Left ventricular diastolic parameters are consistent with Grade I diastolic dysfunction (impaired  relaxation).  2. Right ventricular systolic function is normal. The right ventricular size is normal. Tricuspid regurgitation signal is inadequate for assessing PA pressure.  3. The mitral valve is normal in structure. No evidence of mitral valve regurgitation. No evidence of mitral stenosis.  4. The aortic valve is normal in structure. Aortic valve regurgitation is not visualized. No aortic stenosis is present.  5. The inferior vena cava is normal in size with greater than 50% respiratory variability, suggesting right atrial pressure of 3 mmHg. FINDINGS  Left Ventricle: Left ventricular ejection fraction, by estimation, is 45 to 50%. The left ventricle has mildly decreased function. The left ventricle demonstrates global hypokinesis. The left ventricular internal cavity size was moderately dilated. There is mild left ventricular hypertrophy of the basal-septal segment. Left ventricular diastolic parameters are consistent with Grade I diastolic dysfunction (impaired relaxation). Normal  left ventricular filling pressure. Right Ventricle: The right ventricular size is normal. No increase in right ventricular wall thickness. Right ventricular systolic function is normal. Tricuspid regurgitation signal is inadequate for assessing PA pressure. Left Atrium: Left atrial size was normal in size. Right Atrium: Right atrial size was normal in size. Pericardium: There is no evidence of pericardial effusion. Mitral Valve: The mitral valve is normal in structure. Mild to moderate mitral annular calcification. No evidence of mitral valve regurgitation. No evidence of mitral valve stenosis. Tricuspid Valve: The tricuspid valve is normal in structure. Tricuspid valve regurgitation is not demonstrated. No evidence of tricuspid stenosis. Aortic Valve: The aortic valve is normal in structure. Aortic valve regurgitation is not visualized. No aortic stenosis is present. Pulmonic Valve: The pulmonic valve was normal in structure. Pulmonic valve regurgitation is not visualized. No evidence of pulmonic stenosis. Aorta: The aortic root is normal in size and structure. Venous: The inferior vena cava is normal in size with greater than 50% respiratory variability, suggesting right atrial pressure of 3 mmHg. IAS/Shunts: No atrial level shunt detected by color flow Doppler. Additional Comments: A device lead is visualized.  LEFT VENTRICLE PLAX 2D LVIDd:         6.50 cm      Diastology LVIDs:         4.80 cm      LV e' medial:    5.33 cm/s LV PW:         1.10 cm      LV E/e' medial:  17.0 LV IVS:        1.20 cm      LV e' lateral:   9.68 cm/s LVOT diam:     2.00 cm      LV E/e' lateral: 9.4 LV SV:         65 LV SV Index:   27 LVOT Area:     3.14 cm  LV Volumes (MOD) LV vol d, MOD A4C: 111.0 ml LV vol s, MOD A4C: 59.5 ml LV SV MOD A4C:     111.0 ml RIGHT VENTRICLE             IVC RV S prime:     18.30 cm/s  IVC diam: 1.90 cm TAPSE (M-mode): 3.0 cm LEFT ATRIUM             Index LA diam:        4.40 cm 1.83 cm/m LA Vol (A2C):    78.8 ml 32.72 ml/m LA Vol (A4C):   64.2 ml 26.66 ml/m LA Biplane Vol: 72.0 ml 29.90 ml/m  AORTIC VALVE LVOT Vmax:   122.00  cm/s LVOT Vmean:  80.100 cm/s LVOT VTI:    0.207 m  AORTA Ao Root diam: 3.20 cm Ao Asc diam:  3.40 cm MITRAL VALVE MV Area (PHT): 3.27 cm     SHUNTS MV Decel Time: 232 msec     Systemic VTI:  0.21 m MV E velocity: 90.70 cm/s   Systemic Diam: 2.00 cm MV A velocity: 113.00 cm/s MV E/A ratio:  0.80 Armanda Magic MD Electronically signed by Armanda Magic MD Signature Date/Time: 09/28/2022/7:33:40 PM    Final       Signature  -   Susa Raring M.D on 10/02/2022 at 9:08 AM   -  To page go to [email or web address]

## 2022-10-03 ENCOUNTER — Encounter: Payer: Self-pay | Admitting: Pharmacist

## 2022-10-03 DIAGNOSIS — R0609 Other forms of dyspnea: Secondary | ICD-10-CM | POA: Diagnosis not present

## 2022-10-03 LAB — CBC WITH DIFFERENTIAL/PLATELET
Abs Immature Granulocytes: 0.04 10*3/uL (ref 0.00–0.07)
Basophils Absolute: 0 10*3/uL (ref 0.0–0.1)
Basophils Relative: 1 %
Eosinophils Absolute: 0.2 10*3/uL (ref 0.0–0.5)
Eosinophils Relative: 3 %
HCT: 30.5 % — ABNORMAL LOW (ref 39.0–52.0)
Hemoglobin: 10.4 g/dL — ABNORMAL LOW (ref 13.0–17.0)
Immature Granulocytes: 1 %
Lymphocytes Relative: 10 %
Lymphs Abs: 0.6 10*3/uL — ABNORMAL LOW (ref 0.7–4.0)
MCH: 35.9 pg — ABNORMAL HIGH (ref 26.0–34.0)
MCHC: 34.1 g/dL (ref 30.0–36.0)
MCV: 105.2 fL — ABNORMAL HIGH (ref 80.0–100.0)
Monocytes Absolute: 0.4 10*3/uL (ref 0.1–1.0)
Monocytes Relative: 7 %
Neutro Abs: 4.7 10*3/uL (ref 1.7–7.7)
Neutrophils Relative %: 78 %
Platelets: 31 10*3/uL — ABNORMAL LOW (ref 150–400)
RBC: 2.9 MIL/uL — ABNORMAL LOW (ref 4.22–5.81)
RDW: 16.4 % — ABNORMAL HIGH (ref 11.5–15.5)
WBC: 5.9 10*3/uL (ref 4.0–10.5)
nRBC: 0 % (ref 0.0–0.2)

## 2022-10-03 LAB — COMPREHENSIVE METABOLIC PANEL
ALT: 47 U/L — ABNORMAL HIGH (ref 0–44)
AST: 72 U/L — ABNORMAL HIGH (ref 15–41)
Albumin: 2.5 g/dL — ABNORMAL LOW (ref 3.5–5.0)
Alkaline Phosphatase: 136 U/L — ABNORMAL HIGH (ref 38–126)
Anion gap: 7 (ref 5–15)
BUN: 17 mg/dL (ref 8–23)
CO2: 24 mmol/L (ref 22–32)
Calcium: 8.4 mg/dL — ABNORMAL LOW (ref 8.9–10.3)
Chloride: 99 mmol/L (ref 98–111)
Creatinine, Ser: 0.79 mg/dL (ref 0.61–1.24)
GFR, Estimated: >60 mL/min (ref >60)
Glucose, Bld: 102 mg/dL — ABNORMAL HIGH (ref 70–99)
Potassium: 4.1 mmol/L (ref 3.5–5.1)
Sodium: 130 mmol/L — ABNORMAL LOW (ref 135–145)
Total Bilirubin: 4.4 mg/dL — ABNORMAL HIGH (ref 0.3–1.2)
Total Protein: 7.6 g/dL (ref 6.5–8.1)

## 2022-10-03 LAB — MAGNESIUM: Magnesium: 2.1 mg/dL (ref 1.7–2.4)

## 2022-10-03 LAB — BRAIN NATRIURETIC PEPTIDE: B Natriuretic Peptide: 240.8 pg/mL — ABNORMAL HIGH (ref 0.0–100.0)

## 2022-10-03 MED ORDER — LACTULOSE 10 GM/15ML PO SOLN: 20.0000 g | Freq: Three times a day (TID) | ORAL | 0 refills | Status: DC

## 2022-10-03 MED ORDER — SPIRONOLACTONE 25 MG PO TABS: 12.5000 mg | ORAL_TABLET | Freq: Every day | ORAL | Status: AC

## 2022-10-03 MED ORDER — FUROSEMIDE 40 MG PO TABS: 40.0000 mg | ORAL_TABLET | Freq: Every day | ORAL | 11 refills | Status: AC

## 2022-10-03 NOTE — Telephone Encounter (Signed)
Opened in error

## 2022-10-03 NOTE — Discharge Summary (Signed)
[name] ZOX:[id number] DOB: 08-14-1955 DOA: 09/26/2022  PCP: [name], MD  Admit date: 09/26/2022  Discharge date: 10/03/2022  Admitted From: SNF   Disposition:  SNF   Recommendations for Outpatient Follow-up:   Follow up with PCP in 1-2 weeks  PCP Please obtain BMP/CBC, 2 view CXR in 1week,  (see Discharge instructions)   PCP Please follow up on the following pending results: Monitor BMP, diuretics and fluid status closely.   Home Health: None   Equipment/Devices: None  Consultations: None  Discharge Condition: Stable    CODE STATUS: Full    Diet Recommendation: Heart Healthy, 1.5 L fluid restriction per day    Chief Complaint  Patient presents with   Shortness of Breath     Brief history of present illness from the day of admission and additional interim summary    67 year old male with history of Charcot-Marie-Tooth disease, NASH cirrhosis, COPD, CAD, HTN, AAA, chronic combined CHF, and DM presented with shortness of breath. He was last admitted from 4/19-21 with a fall at [facility].  He was found to have AKI, a traumatic SDH, and scrotal cellulitis; Plavix was to be held for 2 weeks post-bleed.  He reports that since he returned to his facility he has been feeling very fatigued and SOB.  He has LE edema and stasis changes.  In the ER he was found to be profoundly dehydrated, hypotensive however had evidence of third spacing as well he received IV fluids for blood pressure support and was admitted for further care.                                                                  Hospital Course   Acute on chronic dyspnea with generalized weakness, due to acute on chronic combined diastolic and systolic CHF of now 45% improved somewhat from before.  Initial diagnosis but actually patient was  dehydrated, he has had longstanding history of shortness of breath at rest, on exam he was extremely dehydrated with hypotension and hyponatremia, he required several liters of IV fluids along with albumin 2 doses IV, he has issues with third spacing but intravascularly he was dehydrated.  He is currently symptom-free he requires 2 L nasal cannula oxygen as needed on which he will be discharged, I have cut down his home diuretic dose considerably, request SNF MD to monitor his diuretic dose along with fluid status closely, I think his shortness of breath arises from underlying COPD and ongoing smoking.   For now low-dose beta-blocker, if blood pressure holds will entertain Aldactone, Entresto, ACE/ARB can be entertained at SNF.    Chronic combined systolic and diastolic heart failure kindly see #1 above.  Sleep dehydrated on 10/02/2022.     Recent fall at SNF with Subdural hematoma  -  Most  likely traumatic from fall at SNF, CT head here stable, placed to resume Plavix, 2 weeks of hold is over.  Will resume Plavix from 10/08/2022 at SNF.  Repeat CT scan on 10/01/2022 for some intermittent black spots, CT scan repeat stable symptoms have resolved.  Cellulitis of scrotum - was diagnosed last admission finishing his oral doxycycline.   CAD: Currently stable, Plavix once stable from subdural standpoint, allergic to statin, aspirin discontinued last admission, low-dose beta-blocker added will monitor blood pressure, diuretics as above for now.  No acute issues.   Hyperlipidemia: Intolerant to statin, continue low-cholesterol diet.   Cirrhosis of liver, unspecified cause - Portal hypertension with splenomegaly, thrombocytopenia and esophageal varices, with some hepatic encephalopathy and elevated ammonia. - Continue diuretics as needed along with scheduled rifaximin along with lactulose.  Level improving.  Post discharge outpatient follow-up with GI.  Noncompliant with lactulose counseled.   COPD  - Currently  stable.  Continue current inhaled regimen.   Anemia of chronic disease, Macrocytosis -  Possibly from chronic illnesses.  Hemoglobin stable.  Monitor intermittently   Thrombocytopenia  - CT from cirrhosis of liver and splenomegaly,     Fall at SNF  - Possibly mechanical.  Fall precautions.  PT eval.  TOC consult, PT/OT to be arranged at SNF.   Tobacco abuse  - Currently on nicotine patch.  Counseled to quit.   Chronic pain  - Continue gabapentin, fentanyl patch and oxycodone as needed   Morbid obesity with BMI of 40 -  follow-up with PCP   Hyponatremia, hypotension severe on 09/30/2022 due to dehydration.  Diuretics have been stopped his H&H has gone up and clinically appears severely dehydrated, due to underlying cirrhosis and low albumin levels he easily third spaces, trial of albumin with IV fluids repeat on 10/02/2022.  Hyponatremia and blood pressure improving.  Will be discharged on less than half home dose diuretic, will request SNF MD to monitor it closely.    Discharge diagnosis     Principal Problem:   Chronic dyspnea Active Problems:   Essential hypertension   HLD (hyperlipidemia)   Cirrhosis of liver not due to alcohol (HCC)   Chronic combined systolic and diastolic heart failure (HCC)   COPD (chronic obstructive pulmonary disease) (HCC)   Chronic pain syndrome   Obesity, morbid, BMI 40.0-49.9 (HCC)   Abdominal aortic aneurysm (AAA) without rupture (HCC)   Subdural hematoma (HCC)   Chronic hyponatremia   Chronic kidney disease, stage 3a (HCC)   Volume overload    Discharge instructions    Discharge Instructions     Ambulatory Referral for Lung Cancer Scre   Complete by: As directed    Discharge instructions   Complete by: As directed    Follow with Primary MD [name], MD in 7 days, follow-up with your cardiologist and gastroenterologist within a week of discharge.  Get CBC, CMP, 2 view Chest X ray -  checked next visit with your primary MD or SNF MD    Activity: As tolerated with Full fall precautions use walker/cane & assistance as needed  Disposition SNF  Diet: Heart Healthy with strict 1.2 L fluid restriction per day.  Special Instructions: If you have smoked or chewed Tobacco  in the last 2 yrs please stop smoking, stop any regular Alcohol  and or any Recreational drug use.  On your next visit with your primary care physician please Get Medicines reviewed and adjusted.  Please request your Prim.MD to go over all Hospital Tests and  Procedure/Radiological results at the follow up, please get all Hospital records sent to your Prim MD by signing hospital release before you go home.  If you experience worsening of your admission symptoms, develop shortness of breath, life threatening emergency, suicidal or homicidal thoughts you must seek medical attention immediately by calling 911 or calling your MD immediately  if symptoms less severe.  You Must read complete instructions/literature along with all the possible adverse reactions/side effects for all the Medicines you take and that have been prescribed to you. Take any new Medicines after you have completely understood and accpet all the possible adverse reactions/side effects.   Increase activity slowly   Complete by: As directed        Discharge Medications   Allergies as of 10/03/2022       Reactions   Statins Shortness Of Breath   Cough, trouble breathing Cough, trouble breathing   Losartan Other (See Comments)   Causes him to have pain   Wellbutrin [bupropion] Other (See Comments)   Worsened mood - crying   Allopurinol Nausea Only   Baclofen Nausea And Vomiting   Penicillins Nausea And Vomiting   Did it involve swelling of the face/tongue/throat, SOB, or low BP? N/A Did it involve sudden or severe rash/hives, skin peeling, or any reaction on the inside of your mouth or nose? N/A Did you need to seek medical attention at a hospital or doctor's office? N/A When did it  last happen? Child     If all above answers are "NO", may proceed with cephalosporin use.   Tramadol Nausea Only        Medication List     STOP taking these medications    doxycycline 100 MG tablet Commonly known as: VIBRA-TABS   metolazone 2.5 MG tablet Commonly known as: ZAROXOLYN   torsemide 20 MG tablet Commonly known as: DEMADEX       TAKE these medications    albuterol (2.5 MG/3ML) 0.083% nebulizer solution Commonly known as: PROVENTIL USE 1 VIAL PER NEBULIZER EVERY 6 HRS AS NEEDED FOR WHEEZING What changed:  how much to take how to take this when to take this reasons to take this additional instructions   albuterol 108 (90 Base) MCG/ACT inhaler Commonly known as: VENTOLIN HFA TAKE 2 PUFFS BY MOUTH EVERY 6 HOURS AS NEEDED FOR WHEEZE OR SHORTNESS OF BREATH What changed: See the new instructions.   carvedilol 3.125 MG tablet Commonly known as: COREG Take 3.125 mg by mouth 2 (two) times daily.   cyanocobalamin 500 MCG tablet Commonly known as: VITAMIN B12 Take 1 tablet (500 mcg total) by mouth every Monday, Wednesday, and Friday.   Desitin 40 % Pste Generic drug: Zinc Oxide Apply 113 Applications topically continuous as needed (Use as needed).   fentaNYL 75 MCG/HR Commonly known as: DURAGESIC Place 1 patch onto the skin every 3 (three) days.   ferrous sulfate 325 (65 FE) MG tablet Take 1 tablet (325 mg total) by mouth every other day.   folic acid 1 MG tablet Commonly known as: FOLVITE TAKE 1 TABLET BY MOUTH EVERY DAY   furosemide 40 MG tablet Commonly known as: Lasix Take 1 tablet (40 mg total) by mouth daily.   gabapentin 300 MG capsule Commonly known as: NEURONTIN Take 1 capsule (300 mg total) by mouth in the morning and at bedtime.   lactulose 10 GM/15ML solution Commonly known as: CHRONULAC Take 30 mLs (20 g total) by mouth 3 (three) times daily. What changed: when to  take this   nicotine 14 mg/24hr patch Commonly known as:  NICODERM CQ - dosed in mg/24 hours APPLY 1 PATCH ONTO THE SKIN EVERY DAY What changed: See the new instructions.   nitroGLYCERIN 0.4 MG SL tablet Commonly known as: NITROSTAT Place 1 tablet (0.4 mg total) under the tongue every 5 (five) minutes as needed for chest pain.   Nyamyc powder Generic drug: nystatin Apply 1 Application topically 3 (three) times daily.   ondansetron 4 MG tablet Commonly known as: ZOFRAN TAKE 1 TABLET BY MOUTH EVERY 8 HOURS AS NEEDED FOR NAUSEA AND VOMITING   oxyCODONE 5 MG immediate release tablet Commonly known as: Oxy IR/ROXICODONE Take 1 tablet (5 mg total) by mouth every 8 (eight) hours as needed for severe pain.   pantoprazole 40 MG tablet Commonly known as: PROTONIX Take 40 mg by mouth daily.   Spiriva Respimat 2.5 MCG/ACT Aers Generic drug: Tiotropium Bromide Monohydrate Inhale 2 puffs into the lungs daily.   spironolactone 25 MG tablet Commonly known as: Aldactone Take 0.5 tablets (12.5 mg total) by mouth daily. What changed:  medication strength how much to take   Tart Cherry Advanced Caps Take 1 capsule by mouth daily.   TART CHERRY PO Take 1 tablet by mouth in the morning and at bedtime.   Xifaxan 550 MG Tabs tablet Generic drug: rifaximin Take 550 mg by mouth 2 (two) times daily.         Follow-up Information     [name], MD. Schedule an appointment as soon as possible for a visit in 1 week(s).   Specialty: Family Medicine Why: Also kindly follow with your gastroenterologist and cardiologist within a week of discharge. Contact information: [address] [place] [address] [phone]                 Major procedures and Radiology Reports - PLEASE review detailed and final reports thoroughly  -      DG Chest [facility] 1 View  Result Date: 10/02/2022 CLINICAL DATA:  Shortness of breath. EXAM: PORTABLE CHEST 1 VIEW COMPARISON:  09/30/2022 FINDINGS: Stable cardiac enlargement and appearance  single lead pacing/ICD device. Improved aeration of the lungs with no further suggestion interstitial edema or overt airspace edema. There is no evidence of focal airspace consolidation, pneumothorax, nodule or pleural fluid. IMPRESSION: Stable cardiac enlargement. Improved aeration of the lungs. No acute findings. Electronically Signed   By: Irish Lack M.D.   On: 10/02/2022 07:58   CT HEAD WO CONTRAST ( )  Result Date: 10/01/2022 CLINICAL DATA:  Stroke follow-up EXAM: CT HEAD WITHOUT CONTRAST TECHNIQUE: Contiguous axial images were obtained from the base of the skull through the vertex without intravenous contrast. RADIATION DOSE REDUCTION: This exam was performed according to the departmental dose-optimization program which includes automated exposure control, adjustment of the mA and/or kV according to patient size and/or use of iterative reconstruction technique. COMPARISON:  Head CT from 4 days ago FINDINGS: Brain: A subdural hematoma along the left paramedian falx measures up to 5 mm, non progressed. No infarct or new site of hemorrhage. Stable subcentimeter lipoma in near the tip of the basilar. Vascular: No hyperdense vessel or unexpected calcification. Skull: Negative for fracture Sinuses/Orbits: No acute finding Other: Parotid nodules including partially covered 13 mm nodule on the right. Bilateral parotid masses are known from prior dedicated imaging, reference neck CT 08/23/2020. IMPRESSION: Unchanged left parafalcine subdural hematoma measuring up to 5 mm in thickness. Electronically Signed   By: Christiane Ha  Watts M.D.   On: 10/01/2022 10:44   DG Chest Port 1 View  Result Date: 09/30/2022 CLINICAL DATA:  Shortness of breath. EXAM: PORTABLE CHEST 1 VIEW COMPARISON:  09/29/2022 FINDINGS: The cardio pericardial silhouette is enlarged. There is pulmonary vascular congestion without overt pulmonary edema. Low lung volumes. No focal consolidation overt edema, or pleural effusion. Left AICD again  noted. Bones are diffusely demineralized. Telemetry leads overlie the chest. IMPRESSION: Stable.  Cardiomegaly with vascular congestion. Electronically Signed   By: Kennith Center M.D.   On: 09/30/2022 12:50   DG Chest Port 1 View  Result Date: 09/29/2022 CLINICAL DATA:  Shortness of breath. EXAM: PORTABLE CHEST 1 VIEW COMPARISON:  09/26/2022 FINDINGS: The cardio pericardial silhouette is enlarged. Low lung volumes. There is pulmonary vascular congestion without overt pulmonary edema. No overt pulmonary edema, focal airspace consolidation, or pleural effusion. Left-sided AICD again noted. IMPRESSION: Low volume film with vascular congestion. Electronically Signed   By: Kennith Center M.D.   On: 09/29/2022 07:16   ECHOCARDIOGRAM COMPLETE  Result Date: 09/28/2022    ECHOCARDIOGRAM REPORT   Patient Name:   VIRGEL HARO Date of Exam: 09/28/2022 Medical Rec #:  [id number]          Height:       70.0 in Accession #:    [id number]         Weight:       280.0 lb Date of Birth:  1956/05/11          BSA:          2.408 m Patient Age:    59 years           BP:           120/92 mmHg Patient Gender: M                  HR:           83 bpm. Exam Location:  Inpatient Procedure: 2D Echo, Color Doppler and Cardiac Doppler Indications:    acute systolic chf  History:        Patient has prior history of Echocardiogram examinations, most                 recent 10/30/2018. CAD, COPD and chronic kidney disease.                 Cirrhosis., Signs/Symptoms:Edema; Risk Factors:Hypertension,                 Dyslipidemia, Diabetes and Current Smoker.  Sonographer:    Delcie Roch RDCS Referring Phys: [id number] RIPUDEEP K RAI  Sonographer Comments: Patient is obese. Image acquisition challenging due to patient body habitus. IMPRESSIONS  1. Left ventricular ejection fraction, by estimation, is 45 to 50%. The left ventricle has mildly decreased function. The left ventricle demonstrates global hypokinesis. The left ventricular internal  cavity size was moderately dilated. There is mild left ventricular hypertrophy of the basal-septal segment. Left ventricular diastolic parameters are consistent with Grade I diastolic dysfunction (impaired relaxation).  2. Right ventricular systolic function is normal. The right ventricular size is normal. Tricuspid regurgitation signal is inadequate for assessing PA pressure.  3. The mitral valve is normal in structure. No evidence of mitral valve regurgitation. No evidence of mitral stenosis.  4. The aortic valve is normal in structure. Aortic valve regurgitation is not visualized. No aortic stenosis is present.  5. The inferior vena cava is normal in size with greater than 50% respiratory  variability, suggesting right atrial pressure of 3 mmHg. FINDINGS  Left Ventricle: Left ventricular ejection fraction, by estimation, is 45 to 50%. The left ventricle has mildly decreased function. The left ventricle demonstrates global hypokinesis. The left ventricular internal cavity size was moderately dilated. There is mild left ventricular hypertrophy of the basal-septal segment. Left ventricular diastolic parameters are consistent with Grade I diastolic dysfunction (impaired relaxation). Normal left ventricular filling pressure. Right Ventricle: The right ventricular size is normal. No increase in right ventricular wall thickness. Right ventricular systolic function is normal. Tricuspid regurgitation signal is inadequate for assessing PA pressure. Left Atrium: Left atrial size was normal in size. Right Atrium: Right atrial size was normal in size. Pericardium: There is no evidence of pericardial effusion. Mitral Valve: The mitral valve is normal in structure. Mild to moderate mitral annular calcification. No evidence of mitral valve regurgitation. No evidence of mitral valve stenosis. Tricuspid Valve: The tricuspid valve is normal in structure. Tricuspid valve regurgitation is not demonstrated. No evidence of tricuspid  stenosis. Aortic Valve: The aortic valve is normal in structure. Aortic valve regurgitation is not visualized. No aortic stenosis is present. Pulmonic Valve: The pulmonic valve was normal in structure. Pulmonic valve regurgitation is not visualized. No evidence of pulmonic stenosis. Aorta: The aortic root is normal in size and structure. Venous: The inferior vena cava is normal in size with greater than 50% respiratory variability, suggesting right atrial pressure of 3 mmHg. IAS/Shunts: No atrial level shunt detected by color flow Doppler. Additional Comments: A device lead is visualized.  LEFT VENTRICLE PLAX 2D LVIDd:         6.50 cm      Diastology LVIDs:         4.80 cm      LV e' medial:    5.33 cm/s LV PW:         1.10 cm      LV E/e' medial:  17.0 LV IVS:        1.20 cm      LV e' lateral:   9.68 cm/s LVOT diam:     2.00 cm      LV E/e' lateral: 9.4 LV SV:         65 LV SV Index:   27 LVOT Area:     3.14 cm  LV Volumes (MOD) LV vol d, MOD A4C: 111.0 ml LV vol s, MOD A4C: 59.5 ml LV SV MOD A4C:     111.0 ml RIGHT VENTRICLE             IVC RV S prime:     18.30 cm/s  IVC diam: 1.90 cm TAPSE (M-mode): 3.0 cm LEFT ATRIUM             Index LA diam:        4.40 cm 1.83 cm/m LA Vol (A2C):   78.8 ml 32.72 ml/m LA Vol (A4C):   64.2 ml 26.66 ml/m LA Biplane Vol: 72.0 ml 29.90 ml/m  AORTIC VALVE LVOT Vmax:   122.00 cm/s LVOT Vmean:  80.100 cm/s LVOT VTI:    0.207 m  AORTA Ao Root diam: 3.20 cm Ao Asc diam:  3.40 cm MITRAL VALVE MV Area (PHT): 3.27 cm     SHUNTS MV Decel Time: 232 msec     Systemic VTI:  0.21 m MV E velocity: 90.70 cm/s   Systemic Diam: 2.00 cm MV A velocity: 113.00 cm/s MV E/A ratio:  0.80 Armanda Magic MD Electronically signed by Gloris Manchester  Turner MD Signature Date/Time: 09/28/2022/7:33:40 PM    Final    CT HEAD WO CONTRAST ( )  Result Date: 09/27/2022 CLINICAL DATA:  Follow-up subdural hematoma EXAM: CT HEAD WITHOUT CONTRAST TECHNIQUE: Contiguous axial images were obtained from the base of the  skull through the vertex without intravenous contrast. RADIATION DOSE REDUCTION: This exam was performed according to the departmental dose-optimization program which includes automated exposure control, adjustment of the mA and/or kV according to patient size and/or use of iterative reconstruction technique. COMPARISON:  09/23/2022 FINDINGS: Brain: Again noted is the left para falcine subdural hematoma. This measures 5 mm in thickness compared to 6 mm previously. Overall, the size of the subdural hematoma appears to have decreased. Subdural hematoma along the posterior falx measures 4 mm, stable since prior study. No new areas of hemorrhage. No infarct or hydrocephalus. Mild cerebral atrophy. Vascular: No hyperdense vessel or unexpected calcification. Skull: No acute calvarial abnormality. Sinuses/Orbits: No acute findings Other: None IMPRESSION: Left parafalcine subdural hematoma again noted. The superior component appears to have slightly decreased in size wall the posterior component is stable. No new areas of hemorrhage. Electronically Signed   By: Charlett Nose M.D.   On: 09/27/2022 18:19   CT Angio Chest PE W and/or Wo Contrast  Result Date: 09/27/2022 CLINICAL DATA:  Shortness of breath. EXAM: CT ANGIOGRAPHY CHEST WITH CONTRAST TECHNIQUE: Multidetector CT imaging of the chest was performed using the standard protocol during bolus administration of intravenous contrast. Multiplanar CT image reconstructions and MIPs were obtained to evaluate the vascular anatomy. RADIATION DOSE REDUCTION: This exam was performed according to the departmental dose-optimization program which includes automated exposure control, adjustment of the mA and/or kV according to patient size and/or use of iterative reconstruction technique. CONTRAST:  75mL OMNIPAQUE IOHEXOL 350 MG/ML SOLN COMPARISON:  September 22, 2022 FINDINGS: Cardiovascular: A single lead ventricular pacer is noted. There is marked severity calcification of the  aortic arch, without evidence of aortic aneurysm. Satisfactory opacification of the pulmonary arteries to the segmental level. No evidence of pulmonary embolism. There is mild cardiomegaly with marked severity coronary artery calcification. No pericardial effusion. Mediastinum/Nodes: No enlarged mediastinal, hilar, or axillary lymph nodes. Thyroid gland, trachea, and esophagus demonstrate no significant findings. Lungs/Pleura: Mild atelectasis is seen along the posterior aspects of the right upper lobe, right middle lobe and right lower lobe. No pleural effusion or pneumothorax is identified. Upper Abdomen: The liver is cirrhotic in appearance. The spleen is markedly enlarged. Multiple punctate parenchymal calcifications are seen within the head of the pancreas. There is stenting of the visualized portion of the suprarenal and infrarenal abdominal aorta. Bilateral renal artery stents are also noted. Musculoskeletal: Chronic appearing lateral ninth and posterior tenth and eleventh right rib fractures are seen. A metallic density fixation plate and screws are seen along the anterior aspect of the lower cervical spine. Multilevel degenerative changes seen throughout the thoracic spine. Review of the MIP images confirms the above findings. IMPRESSION: 1. No evidence of pulmonary embolism. 2. Mild posterior right upper lobe, right middle lobe and right lower lobe atelectasis. 3. Mild cardiomegaly with marked severity coronary artery calcification. 4. Cirrhotic liver with marked splenomegaly. 5. Abdominal aortic stenting. 6. Chronic appearing lateral ninth and posterior tenth and eleventh right rib fractures. 7. Aortic atherosclerosis. Aortic Atherosclerosis (ICD10-I70.0). Electronically Signed   By: Aram Candela M.D.   On: 09/27/2022 01:13   DG Chest Port 1 View  Result Date: 09/26/2022 CLINICAL DATA:  Shortness of breath upon exertion. EXAM: PORTABLE  CHEST 1 VIEW COMPARISON:  09/22/2022. FINDINGS: The heart is  enlarged and the mediastinal contour is stable. Rightward shift of the heart and mediastinal structures is unchanged. The pulmonary vasculature is distended. No consolidation, effusion, or pneumothorax. A single lead pacemaker device is present over the left chest. Cervical spinal fusion hardware and abdominal aortic endograft are noted. No acute osseous abnormality. IMPRESSION: Cardiomegaly with pulmonary vascular congestion. Electronically Signed   By: Thornell Sartorius M.D.   On: 09/26/2022 22:34   US SCROTUM  Result Date: 09/23/2022 CLINICAL DATA:  [id number] Cellulitis [id number] EXAM: ULTRASOUND OF SCROTUM TECHNIQUE: Complete ultrasound examination of the testicles, epididymis, and other scrotal structures was performed. COMPARISON:  01/31/2022 FINDINGS: Right testicle Measurements: 2.5 x 1.2 x 2.1 cm. No mass or microlithiasis visualized. Left testicle Measurements: 3.0 x 1.5 x 2.0 cm. No mass or microlithiasis visualized. Right epididymis: 6 mm epididymal head cyst. Otherwise normal in size and appearance. Left epididymis:  Normal in size and appearance.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

## 2022-10-03 NOTE — TOC Transition Note (Signed)
Transition of Care [facility]) - CM/SW Discharge Note   Patient Details  Name: [name] MRN: [id number] Date of Birth: 1955/11/05  [facility] CM/SW Contact:  Mearl Latin, LCSW Phone Number: 10/03/2022, 10:27 AM   Clinical Narrative:    Patient will DC to: [facility] Anticipated DC date: 10/03/22 Family notified: Sister, Lurena Joiner (as requested by pt) Transport by: Sharin Mons   Per MD patient ready for DC to [facility]. RN to call report prior to discharge [phone] room [phone]). RN, patient, patient's family, and facility notified of DC. Discharge Summary and FL2 sent to facility. DC packet on chart including signed DNR and MOST form (per MD no signed scripts). Ambulance transport requested for patient.   CSW will sign off for now as social work intervention is no longer needed. Please consult [place] again if new needs arise.     Final next level of care: Skilled Nursing Facility Barriers to Discharge: Barriers Resolved   Patient Goals and CMS Choice CMS Medicare.gov Compare Post Acute Care list provided to:: Patient Choice offered to / list presented to : Patient  Discharge Placement     Existing PASRR number confirmed : 10/03/22          Patient chooses bed at: [facility] Patient to be transferred to facility by: [facility] Name of family member notified: Sister Patient and family notified of of transfer: 10/03/22  Discharge Plan and Services Additional resources added to the After Visit Summary for       Post Acute Care Choice: Skilled Nursing Facility                               Social Determinants of Health (SDOH) Interventions SDOH Screenings   Food Insecurity: No Food Insecurity (09/28/2022)  Housing: Low Risk  (09/28/2022)  Transportation Needs: No Transportation Needs (09/28/2022)  Utilities: Not At Risk (09/28/2022)  Depression (PHQ2-9): High Risk (10/04/2021)  Financial Resource Strain: High Risk (12/27/2020)   Physical Activity: Unknown (02/20/2018)  Social Connections: Unknown (02/20/2018)  Tobacco Use: High Risk (09/28/2022)     Readmission Risk Interventions    10/02/2022    2:41 PM  Readmission Risk Prevention Plan  Transportation Screening Complete  Medication Review (RN Care Manager) Complete  HRI or Home Care Consult Complete  SW Recovery Care/Counseling Consult Complete  Palliative Care Screening Not Applicable  Skilled Nursing Facility Complete

## 2022-10-03 NOTE — Discharge Instructions (Signed)
Follow with Primary MD [name], MD in 7 days, follow-up with your cardiologist and gastroenterologist within a week of discharge.  Get CBC, CMP, 2 view Chest X ray -  checked next visit with your primary MD or SNF MD   Activity: As tolerated with Full fall precautions use walker/cane & assistance as needed  Disposition SNF  Diet: Heart Healthy with strict 1.2 L fluid restriction per day.  Special Instructions: If you have smoked or chewed Tobacco  in the last 2 yrs please stop smoking, stop any regular Alcohol  and or any Recreational drug use.  On your next visit with your primary care physician please Get Medicines reviewed and adjusted.  Please request your Prim.MD to go over all Hospital Tests and Procedure/Radiological results at the follow up, please get all Hospital records sent to your Prim MD by signing hospital release before you go home.  If you experience worsening of your admission symptoms, develop shortness of breath, life threatening emergency, suicidal or homicidal thoughts you must seek medical attention immediately by calling 911 or calling your MD immediately  if symptoms less severe.  You Must read complete instructions/literature along with all the possible adverse reactions/side effects for all the Medicines you take and that have been prescribed to you. Take any new Medicines after you have completely understood and accpet all the possible adverse reactions/side effects.

## 2022-10-04 DIAGNOSIS — E119 Type 2 diabetes mellitus without complications: Secondary | ICD-10-CM | POA: Diagnosis not present

## 2022-10-04 DIAGNOSIS — I10 Essential (primary) hypertension: Secondary | ICD-10-CM | POA: Diagnosis not present

## 2022-10-04 DIAGNOSIS — E559 Vitamin D deficiency, unspecified: Secondary | ICD-10-CM | POA: Diagnosis not present

## 2022-10-05 DIAGNOSIS — Z6841 Body Mass Index (BMI) 40.0 and over, adult: Secondary | ICD-10-CM | POA: Diagnosis not present

## 2022-10-05 DIAGNOSIS — S71111A Laceration without foreign body, right thigh, initial encounter: Secondary | ICD-10-CM | POA: Diagnosis not present

## 2022-10-05 DIAGNOSIS — I5042 Chronic combined systolic (congestive) and diastolic (congestive) heart failure: Secondary | ICD-10-CM | POA: Diagnosis not present

## 2022-10-06 DIAGNOSIS — J449 Chronic obstructive pulmonary disease, unspecified: Secondary | ICD-10-CM | POA: Diagnosis not present

## 2022-10-06 DIAGNOSIS — K7460 Unspecified cirrhosis of liver: Secondary | ICD-10-CM | POA: Diagnosis not present

## 2022-10-06 DIAGNOSIS — I5042 Chronic combined systolic (congestive) and diastolic (congestive) heart failure: Secondary | ICD-10-CM | POA: Diagnosis not present

## 2022-10-06 DIAGNOSIS — I472 Ventricular tachycardia, unspecified: Secondary | ICD-10-CM | POA: Diagnosis not present

## 2022-10-13 ENCOUNTER — Other Ambulatory Visit: Payer: Self-pay

## 2022-10-13 ENCOUNTER — Emergency Department (HOSPITAL_COMMUNITY)
Admission: EM | Admit: 2022-10-13 | Discharge: 2022-10-16 | Disposition: A | Discharge status: Skilled Nursing Facility | Payer: Medicare HMO | Attending: Emergency Medicine | Admitting: Emergency Medicine

## 2022-10-13 DIAGNOSIS — F17210 Nicotine dependence, cigarettes, uncomplicated: Secondary | ICD-10-CM | POA: Insufficient documentation

## 2022-10-13 DIAGNOSIS — I959 Hypotension, unspecified: Secondary | ICD-10-CM | POA: Diagnosis not present

## 2022-10-13 DIAGNOSIS — J449 Chronic obstructive pulmonary disease, unspecified: Secondary | ICD-10-CM | POA: Insufficient documentation

## 2022-10-13 DIAGNOSIS — I10 Essential (primary) hypertension: Secondary | ICD-10-CM | POA: Diagnosis not present

## 2022-10-13 DIAGNOSIS — I2510 Atherosclerotic heart disease of native coronary artery without angina pectoris: Secondary | ICD-10-CM | POA: Insufficient documentation

## 2022-10-13 DIAGNOSIS — I5042 Chronic combined systolic (congestive) and diastolic (congestive) heart failure: Secondary | ICD-10-CM | POA: Diagnosis not present

## 2022-10-13 DIAGNOSIS — F32A Depression, unspecified: Secondary | ICD-10-CM | POA: Diagnosis not present

## 2022-10-13 DIAGNOSIS — Z79899 Other long term (current) drug therapy: Secondary | ICD-10-CM | POA: Diagnosis not present

## 2022-10-13 DIAGNOSIS — E119 Type 2 diabetes mellitus without complications: Secondary | ICD-10-CM | POA: Diagnosis not present

## 2022-10-13 DIAGNOSIS — F332 Major depressive disorder, recurrent severe without psychotic features: Secondary | ICD-10-CM | POA: Diagnosis present

## 2022-10-13 DIAGNOSIS — R45851 Suicidal ideations: Secondary | ICD-10-CM | POA: Diagnosis not present

## 2022-10-13 DIAGNOSIS — R627 Adult failure to thrive: Secondary | ICD-10-CM | POA: Diagnosis not present

## 2022-10-13 LAB — COMPREHENSIVE METABOLIC PANEL
ALT: 31 U/L (ref 0–44)
AST: 51 U/L — ABNORMAL HIGH (ref 15–41)
Albumin: 2.5 g/dL — ABNORMAL LOW (ref 3.5–5.0)
Alkaline Phosphatase: 156 U/L — ABNORMAL HIGH (ref 38–126)
Anion gap: 7 (ref 5–15)
BUN: 24 mg/dL — ABNORMAL HIGH (ref 8–23)
CO2: 29 mmol/L (ref 22–32)
Calcium: 8.1 mg/dL — ABNORMAL LOW (ref 8.9–10.3)
Chloride: 99 mmol/L (ref 98–111)
Creatinine, Ser: 1.1 mg/dL (ref 0.61–1.24)
GFR, Estimated: >60 mL/min (ref >60)
Glucose, Bld: 103 mg/dL — ABNORMAL HIGH (ref 70–99)
Potassium: 3.7 mmol/L (ref 3.5–5.1)
Sodium: 135 mmol/L (ref 135–145)
Total Bilirubin: 2.2 mg/dL — ABNORMAL HIGH (ref 0.3–1.2)
Total Protein: 7.8 g/dL (ref 6.5–8.1)

## 2022-10-13 LAB — RAPID URINE DRUG SCREEN, HOSP PERFORMED
Amphetamines: NOT DETECTED
Barbiturates: NOT DETECTED
Benzodiazepines: NOT DETECTED
Cocaine: NOT DETECTED
Opiates: NOT DETECTED
Tetrahydrocannabinol: NOT DETECTED

## 2022-10-13 LAB — CBC
HCT: 32.7 % — ABNORMAL LOW (ref 39.0–52.0)
Hemoglobin: 10.8 g/dL — ABNORMAL LOW (ref 13.0–17.0)
MCH: 36.1 pg — ABNORMAL HIGH (ref 26.0–34.0)
MCHC: 33 g/dL (ref 30.0–36.0)
MCV: 109.4 fL — ABNORMAL HIGH (ref 80.0–100.0)
Platelets: 35 10*3/uL — ABNORMAL LOW (ref 150–400)
RBC: 2.99 MIL/uL — ABNORMAL LOW (ref 4.22–5.81)
RDW: 17.1 % — ABNORMAL HIGH (ref 11.5–15.5)
WBC: 6.6 10*3/uL (ref 4.0–10.5)
nRBC: 0 % (ref 0.0–0.2)

## 2022-10-13 LAB — ACETAMINOPHEN LEVEL: Acetaminophen (Tylenol), Serum: <10 ug/mL — ABNORMAL LOW (ref 10–30)

## 2022-10-13 LAB — SALICYLATE LEVEL: Salicylate Lvl: <7 mg/dL — ABNORMAL LOW (ref 7.0–30.0)

## 2022-10-13 LAB — ETHANOL: Alcohol, Ethyl (B): <10 mg/dL (ref <10)

## 2022-10-13 MED ORDER — ESCITALOPRAM OXALATE 10 MG PO TABS
5.0000 mg | ORAL_TABLET | Freq: Every day | ORAL | Status: DC
  Administered 2022-10-13 – 2022-10-14 (×2): 5 mg via ORAL
  Filled 2022-10-13 (×3): qty 1

## 2022-10-13 MED ORDER — SPIRONOLACTONE 12.5 MG HALF TABLET
12.5000 mg | ORAL_TABLET | Freq: Every day | ORAL | Status: DC
  Administered 2022-10-13 – 2022-10-16 (×3): 12.5 mg via ORAL
  Filled 2022-10-13 (×5): qty 1

## 2022-10-13 MED ORDER — NICOTINE 14 MG/24HR TD PT24
14.0000 mg | MEDICATED_PATCH | Freq: Every day | TRANSDERMAL | Status: DC
  Filled 2022-10-13 (×3): qty 1

## 2022-10-13 MED ORDER — GABAPENTIN 300 MG PO CAPS
300.0000 mg | ORAL_CAPSULE | Freq: Two times a day (BID) | ORAL | Status: DC
  Administered 2022-10-13 – 2022-10-16 (×6): 300 mg via ORAL
  Filled 2022-10-13 (×6): qty 1

## 2022-10-13 MED ORDER — FUROSEMIDE 40 MG PO TABS
40.0000 mg | ORAL_TABLET | Freq: Every day | ORAL | Status: DC
  Administered 2022-10-13 – 2022-10-16 (×3): 40 mg via ORAL
  Filled 2022-10-13 (×4): qty 1

## 2022-10-13 MED ORDER — CARVEDILOL 3.125 MG PO TABS
3.1250 mg | ORAL_TABLET | Freq: Two times a day (BID) | ORAL | Status: DC
  Administered 2022-10-13 – 2022-10-16 (×5): 3.125 mg via ORAL
  Filled 2022-10-13 (×6): qty 1

## 2022-10-13 MED ORDER — ALBUTEROL SULFATE (2.5 MG/3ML) 0.083% IN NEBU: 2.5000 mg | INHALATION_SOLUTION | Freq: Four times a day (QID) | RESPIRATORY_TRACT | Status: DC | PRN

## 2022-10-13 MED ORDER — PANTOPRAZOLE SODIUM 40 MG PO TBEC: 40.0000 mg | DELAYED_RELEASE_TABLET | Freq: Every day | ORAL | Status: DC

## 2022-10-13 MED ORDER — PANTOPRAZOLE SODIUM 40 MG PO TBEC
40.0000 mg | DELAYED_RELEASE_TABLET | Freq: Every day | ORAL | Status: DC
  Administered 2022-10-13 – 2022-10-16 (×4): 40 mg via ORAL
  Filled 2022-10-13 (×5): qty 1

## 2022-10-13 MED ORDER — FUROSEMIDE 40 MG PO TABS: 40.0000 mg | ORAL_TABLET | Freq: Every day | ORAL | Status: DC

## 2022-10-13 MED ORDER — UMECLIDINIUM BROMIDE 62.5 MCG/ACT IN AEPB
1.0000 | INHALATION_SPRAY | Freq: Every day | RESPIRATORY_TRACT | Status: DC
  Administered 2022-10-14 – 2022-10-16 (×3): 1 via RESPIRATORY_TRACT
  Filled 2022-10-13: qty 7

## 2022-10-13 MED ORDER — RIFAXIMIN 550 MG PO TABS
550.0000 mg | ORAL_TABLET | Freq: Two times a day (BID) | ORAL | Status: DC
  Administered 2022-10-13 – 2022-10-16 (×6): 550 mg via ORAL
  Filled 2022-10-13 (×7): qty 1

## 2022-10-13 MED ORDER — OXYCODONE HCL 5 MG PO TABS
5.0000 mg | ORAL_TABLET | Freq: Three times a day (TID) | ORAL | Status: DC | PRN
  Administered 2022-10-13 – 2022-10-15 (×3): 5 mg via ORAL
  Filled 2022-10-13 (×3): qty 1

## 2022-10-13 MED ORDER — LACTULOSE 10 GM/15ML PO SOLN: 20.0000 g | Freq: Three times a day (TID) | ORAL | Status: DC

## 2022-10-13 MED ORDER — TIOTROPIUM BROMIDE MONOHYDRATE 2.5 MCG/ACT IN AERS: 2.0000 | INHALATION_SPRAY | Freq: Every day | RESPIRATORY_TRACT | Status: DC

## 2022-10-13 MED ORDER — LACTULOSE 10 GM/15ML PO SOLN
20.0000 g | Freq: Three times a day (TID) | ORAL | Status: DC
  Administered 2022-10-13 – 2022-10-16 (×6): 20 g via ORAL
  Filled 2022-10-13 (×10): qty 30

## 2022-10-13 NOTE — ED Provider Notes (Signed)
[place] EMERGENCY DEPARTMENT AT [facility] Provider Note   CSN: [id number] Arrival date & time: 10/13/22  1644     History  Chief Complaint  Patient presents with   Depression    [name] is a 67 y.o. male.  HPI Patient presents with suicidal ideation.  Patient's history is notable for substantial medical problems, see below.  In essence he has been in his usual state of health that regard, but has been more despondent and depressed since his wife left him.  Today he reported to his Child psychotherapist that he felt like killing himself.  No recent change in medication, diet, activity. He is in a nursing facility does acknowledge possibility that his situation is also exacerbating his despondency.     Home Medications Prior to Admission medications   Medication Sig Start Date End Date Taking? Authorizing Provider  albuterol (PROVENTIL) (2.5 MG/3ML) 0.083% nebulizer solution USE 1 VIAL PER NEBULIZER EVERY 6 HRS AS NEEDED FOR WHEEZING Patient taking differently: Take 2.5 mg by nebulization every 6 (six) hours as needed for wheezing or shortness of breath. 12/03/20   [name], MD  albuterol (VENTOLIN HFA) 108 (90 Base) MCG/ACT inhaler TAKE 2 PUFFS BY MOUTH EVERY 6 HOURS AS NEEDED FOR WHEEZE OR SHORTNESS OF BREATH Patient taking differently: Inhale 2 puffs into the lungs every 6 (six) hours as needed for wheezing or shortness of breath. 12/14/21   [name], MD  carvedilol (COREG) 3.125 MG tablet Take 3.125 mg by mouth 2 (two) times daily.    [provider]  DESITIN 40 % PSTE Apply 113 Applications topically continuous as needed (Use as needed). 09/24/22   [provider]  fentaNYL (DURAGESIC) 75 MCG/HR Place 1 patch onto the skin every 3 (three) days. 09/24/22   Elgergawy, Leana Roe, MD  ferrous sulfate 325 (65 FE) MG tablet Take 1 tablet (325 mg total) by mouth every other day. 04/29/19   [name], MD  folic acid (FOLVITE) 1 MG  tablet TAKE 1 TABLET BY MOUTH EVERY DAY 11/07/21   [name], MD  furosemide (LASIX) 40 MG tablet Take 1 tablet (40 mg total) by mouth daily. 10/03/22 10/03/23  Leroy Sea, MD  gabapentin (NEURONTIN) 300 MG capsule Take 1 capsule (300 mg total) by mouth in the morning and at bedtime. 06/27/21   [name], MD  lactulose (CHRONULAC) 10 GM/15ML solution Take 30 mLs (20 g total) by mouth 3 (three) times daily. 10/03/22   Leroy Sea, MD  Misc Natural Products [facility] ADVANCED) CAPS Take 1 capsule by mouth daily. 09/24/22   [provider]  nicotine (NICODERM CQ - DOSED IN MG/24 HOURS) 14 mg/24hr patch APPLY 1 PATCH ONTO THE SKIN EVERY DAY Patient taking differently: Place 14 mg onto the skin daily. 02/09/22   [name], MD  nitroGLYCERIN (NITROSTAT) 0.4 MG SL tablet Place 1 tablet (0.4 mg total) under the tongue every 5 (five) minutes as needed for chest pain. 10/10/12   Lewayne Bunting, MD  [facility] powder Apply 1 Application topically 3 (three) times daily. 09/21/22   [provider]  ondansetron (ZOFRAN) 4 MG tablet TAKE 1 TABLET BY MOUTH EVERY 8 HOURS AS NEEDED FOR NAUSEA AND VOMITING 01/09/22   Wyline Mood, MD  oxyCODONE (OXY IR/ROXICODONE) 5 MG immediate release tablet Take 1 tablet (5 mg total) by mouth every 8 (eight) hours as needed for severe pain. 09/24/22   Elgergawy, Leana Roe, MD  pantoprazole (PROTONIX) 40  MG tablet Take 40 mg by mouth daily.    [provider]  spironolactone (ALDACTONE) 25 MG tablet Take 0.5 tablets (12.5 mg total) by mouth daily. 10/03/22 11/02/22  Leroy Sea, MD  TART CHERRY PO Take 1 tablet by mouth in the morning and at bedtime.    [provider]  Tiotropium Bromide Monohydrate (SPIRIVA RESPIMAT) 2.5 MCG/ACT AERS Inhale 2 puffs into the lungs daily. 10/04/21   [name], MD  vitamin B-12 (CYANOCOBALAMIN) 500 MCG tablet Take 1 tablet (500 mcg total) by mouth every Monday, Wednesday, and Friday.  12/27/18   [name], MD  XIFAXAN 550 MG TABS tablet Take 550 mg by mouth 2 (two) times daily. 06/24/22   [provider]      Allergies    Statins, Losartan, Wellbutrin [bupropion], Allopurinol, Baclofen, Penicillins, and Tramadol    Review of Systems   Review of Systems  Genitourinary:        No new concerns, patient has had scrotal irritation for a long time, he and I discussed this and he will follow-up with his urologist on discharge    Physical Exam Updated Vital Signs BP 135/79 (BP Location: Right Arm)   Pulse 77   Temp 98.1 F (36.7 C) (Oral)   Resp 19   SpO2 96%  Physical Exam Vitals and nursing note reviewed.  Constitutional:      General: He is not in acute distress.    Appearance: He is well-developed. He is obese. He is not ill-appearing or diaphoretic.  HENT:     Head: Normocephalic and atraumatic.  Eyes:     Conjunctiva/sclera: Conjunctivae normal.  Pulmonary:     Effort: Pulmonary effort is normal. No respiratory distress.     Breath sounds: No stridor.  Abdominal:     General: There is no distension.  Skin:    General: Skin is warm and dry.  Neurological:     Mental Status: He is alert and oriented to person, place, and time.  Psychiatric:     Comments: Depressed, but pleasantly interactive.  Patient acknowledges earlier suicidal ideation comments     ED Results / Procedures / Treatments   Labs (all labs ordered are listed, but only abnormal results are displayed) Labs Reviewed  COMPREHENSIVE METABOLIC PANEL - Abnormal; Notable for the following components:      Result Value   Glucose, Bld 103 (*)    BUN 24 (*)    Calcium 8.1 (*)    Albumin 2.5 (*)    AST 51 (*)    Alkaline Phosphatase 156 (*)    Total Bilirubin 2.2 (*)    All other components within normal limits  SALICYLATE LEVEL - Abnormal; Notable for the following components:   Salicylate Lvl <7.0 (*)    All other components within normal limits  ACETAMINOPHEN LEVEL -  Abnormal; Notable for the following components:   Acetaminophen (Tylenol), Serum <10 (*)    All other components within normal limits  CBC - Abnormal; Notable for the following components:   RBC 2.99 (*)    Hemoglobin 10.8 (*)    HCT 32.7 (*)    MCV 109.4 (*)    MCH 36.1 (*)    RDW 17.1 (*)    Platelets 35 (*)    All other components within normal limits  ETHANOL  RAPID URINE DRUG SCREEN, HOSP PERFORMED    EKG None  Radiology No results found.  Procedures Procedures    Medications Ordered in ED Medications  albuterol (PROVENTIL) (2.5 MG/3ML) 0.083% nebulizer solution 2.5 mg (has no administration in time range)  carvedilol (COREG) tablet 3.125 mg (has no administration in time range)  furosemide (LASIX) tablet 40 mg (has no administration in time range)  gabapentin (NEURONTIN) capsule 300 mg (has no administration in time range)  lactulose (CHRONULAC) 10 GM/15ML solution 20 g (has no administration in time range)  nicotine (NICODERM CQ - dosed in mg/24 hours) patch 14 mg (has no administration in time range)  oxyCODONE (Oxy IR/ROXICODONE) immediate release tablet 5 mg (has no administration in time range)  pantoprazole (PROTONIX) EC tablet 40 mg (has no administration in time range)  spironolactone (ALDACTONE) tablet 12.5 mg (has no administration in time range)  Tiotropium Bromide Monohydrate AERS 2 puff (2 puffs Inhalation Not Given 10/13/22 1800)  rifaximin (XIFAXAN) tablet 550 mg (has no administration in time range)    ED Course/ Medical Decision Making/ A&P                             Medical Decision Making Male with multiple medical problems including depression, chronic kidney disease, obesity, 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 Patient's physical exam is consistent with prior, chart review, he is afebrile, awake, alert, insightful, is medically cleared for behavior health evaluation.  Amount  and/or Complexity of Data Reviewed External Data Reviewed: notes. Labs: ordered. Decision-making details documented in ED Course.  Risk OTC drugs. Prescription drug management. Decision regarding hospitalization.    Final Clinical Impression(s) / ED Diagnoses Final diagnoses:  Suicidal ideations    Rx / DC Orders ED Discharge Orders     None         Gerhard Munch, MD 10/13/22 1858

## 2022-10-13 NOTE — ED Notes (Signed)
EDP Allen notified of pt c/o SOB respiratory tech also called to come assess pt.

## 2022-10-13 NOTE — Consult Note (Cosign Needed Addendum)
[facility] ED ASSESSMENT   Reason for Consult:  Psychiatry evaluation Referring Physician:  ER Physician Patient Identification: [name] MRN:  [id number] ED Chief Complaint: Severe episode of recurrent major depressive disorder, without psychotic features (HCC)  Diagnosis:  Principal Problem:   Severe episode of recurrent major depressive disorder, without psychotic features [facility])   ED Assessment Time Calculation: Start Time: 1840 Stop Time: 1910 Total Time in Minutes (Assessment Completion): 30   Subjective:   [name] is a 67 y.o. male patient admitted with previous hx of depression was brought in from [facility]  by EMS after he threatened to hang himself.  Per Triage note patient had a recent Divorce and has been living in a Rehab center.  He has not been happy since he was taken there.[name]  HPI:  Patient was seen this evening for evaluation.  He was calm,. Cooperative and engaged in meaningful conversation.  Patient reports that his wife of 100 years marriage left him for another man.  Before doing that she moved him to a Nursing home for Rehab.  Patient states he said things he did not mean out of anger and frustration.  Patient states he still love his now ex wife but will not kill himself.  Patient has no children but states he has two great sisters that cares about him.  Patient who suffers from multiple Medical issues states that his health is concerning to him.  He admits that he cannot remember when  he was diagnosed with depression in the past and he does not remember being given any medication.  He agrees to start a low dose antidepressant.  Patient reports 3-4 hours of sleep since he moved to the Rehab place in January but states hi appetite remains good and he eats three meals a day with snacks. Male, 67 years old, morbidly obese and unkempt brought in for making suicidal comment about wanting to hang himself.  He now states he regret making the comment and  denies SI/HI/AVH.  Patient is alert and oriented x4,   Patient will spend the night in the ER and we will reevaluate in am.  Collateral information from [facility] LPN at the facility is that patient had voiced wanting to hang himself to their NP who contacted the doctor managing the facility.  Being that lately patient had voiced how depressed he is the Doctor instructed for him to be taken to the ER for evaluation.  When EMS Came to the facility patient changed his mind however they decided to bring him to the hospital. Past Psychiatric History: Was diagnosed Depression long time ago.   Was not on Medication for that.  No inpatient Mental health Hospitalization and he does not see a Psychiatrist.  Risk to Self or Others: Is the patient at risk to self? No Has the patient been a risk to self in the past 6 months? No Has the patient been a risk to self within the distant past? No Is the patient a risk to others? No Has the patient been a risk to others in the past 6 months? No Has the patient been a risk to others within the distant past? No  [place] Scale:  Flowsheet Row ED from 10/13/2022 in [facility] Emergency Department at [facility] ED to Hosp-Admission (Discharged) from 09/26/2022 in [facility] ED to Hosp-Admission (Discharged) from 09/22/2022 in [facility]  C-SSRS RISK CATEGORY No Risk No  Risk No Risk       AIMS:  , , ,  ,   ASAM:    Substance Abuse:     Past Medical History:  Past Medical History:  Diagnosis Date   AAA (abdominal aortic aneurysm) (HCC) 09/2012--  monitored by dr Myra Gianotti   stable 5.6cm CTA abdomen 2016   Abnormal drug screen 07/09/2016   1/2/018 - positive oxycodone, fentanyl, inapprop positive MJ - mod risk   Allergic rhinitis    Ascites 03/2019   B12 deficiency    CAD (coronary artery disease) cardiologist-  dr Jens Som   x3 with stents last 2005, EF 40%, predominantly RCA by CT 2016   Cataracts,  bilateral    Cervical spondylosis 05/2010   s/p surgery   Charcot Hilda Lias Tooth muscular atrophy dx  1975   neurologist--  dr love--  type 2 per pt   Chronic pain syndrome    established with [facility] ([place]) --> disagreement and transfered care to Dr Regenia Skeeter at [facility] clinic [facility], requests PCP write Rx but f/u with pain clinic Q6-12 months   COPD (chronic obstructive pulmonary disease) (HCC) 10/2011   minimal by PFTs   DDD (degenerative disc disease)    Disturbances of sensation of smell and taste    improving   Dyspnea on exertion    GERD (gastroesophageal reflux disease)    Gout    Headache    Hepatitis    hepatitis B   Hidradenitis    right groin   Hidradenitis suppurativa dx 2011   goin and leg crease   followd by Roxan Hockey - daily bactrim, s/p intralesional steroid injection 10/2010   Hip osteoarthritis    s/p intraarticular steroid shot (12/2012) ([place]baz[place]o/Caffrey)   History of hepatitis B 1983   History of MI (myocardial infarction)    2000  &  2005   History of pneumonia    History of viral meningitis 2000   HLD (hyperlipidemia)    HTN (hypertension)    Ischemic cardiomyopathy    s/p inferior MI  --  current ef per myoview 39%   Liver cirrhosis secondary to NASH (HCC) 01/2014   by CT scan, rec virtual colonoscopy by Dr Loreta Ave 06/2014   Lumbar herniated disc    Myocardial infarction (HCC)    x2   Nocturia more than twice per night    Obesity    Spinal stenosis    released from NSG.  established with preferred pain (07/2013)   T2DM (type 2 diabetes mellitus) (HCC)    ABIs WNL 2016   Vitamin D deficiency     Past Surgical History:  Procedure Laterality Date   ABDOMINAL AORTIC ENDOVASCULAR FENESTRATED STENT GRAFT N/A 11/30/2015   Procedure: ABDOMINAL AORTIC ENDOVASCULAR FENESTRATED STENT GRAFT;  Surgeon: Nada Libman, MD;  Location: [facility] OR;  Service: Vascular;  Laterality: N/A;   ANTERIOR CERVICAL DECOMP/DISCECTOMY FUSION  01-07-2010    C4 -- C7    CARDIAC CATHETERIZATION  03-30-2005  dr Samule Ohm   ef 40% w/ inferior akinesis/  LM and CFX angiographically normal/  pLAD 30%/   Widely patent stents in RCA and PDA widely patent   CARDIOVASCULAR STRESS TEST  10-23-2012  dr Jens Som   No ischemia/  Moderate scar in the inferior wall, otherwise normal perfusion/  LV ef 39%,  LV wall motion: inferior/ inferolateral hypokinesis   COLONOSCOPY  05/06/2007   normal, small int hemorrhoids rpt 5 yrs due to fmhx - rec against rpt colonoscopy  by Dr Loreta Ave   CORONARY ANGIOPLASTY  2000  dr Jens Som   PCI to RCA and PDA   CORONARY ANGIOPLASTY WITH STENT PLACEMENT  03-19-2005  dr Chrissie Noa downey   inferior STEMI--- DES x4 to RCA w/ balloon angioplasty and balloon angioplasty to jailed PDA ostium/  severe hypokinesis of midinferor wall, ef 50%/  dLM 20%,  mLAD 20%,  dCFX 60%   ESOPHAGOGASTRODUODENOSCOPY  01/2017   dilated benign esophageal stenosis, portal hypertensive gastropathy Marina Goodell)   ESOPHAGOGASTRODUODENOSCOPY (EGD) WITH PROPOFOL N/A 02/20/2018   benign biopsy Tobi Bastos, Sharlet Salina, MD)   ESOPHAGOGASTRODUODENOSCOPY (EGD) WITH PROPOFOL N/A 05/12/2019   Procedure: ESOPHAGOGASTRODUODENOSCOPY (EGD) WITH PROPOFOL;  Surgeon: Wyline Mood, MD;  Location: [facility] ENDOSCOPY;  Service: Gastroenterology;  Laterality: N/A;   HYDRADENITIS EXCISION Right 12/31/2014   Procedure: WIDE EXCISION HIDRADENITIS GROIN; Abigail Miyamoto, MD   IR PARACENTESIS  03/25/2019   LUMBAR DISC SURGERY     L5-S1   LUMBAR LAMINECTOMY  05-18-2010   L2--5   laminectomy/foraminotomy for stenosis ([place])   MYELOGRAM     L5-S1 and L1-2 spondylosis   SACROILIAC JOINT INJECTION Bilateral 10/2013   [facility]   TONSILLECTOMY AND ADENOIDECTOMY  1972   Family History:  Family History  Problem Relation Age of Onset   Cancer Mother        colon   Diabetes Mother    Kidney disease Mother    Aneurysm Mother        AAA   Rheum arthritis Mother    Charcot-Marie-Tooth disease Mother    Heart disease Mother         before age 77   Cancer Father        skin   Heart attack Father    Heart disease Father        before age 25   Cancer Brother        skin   Coronary artery disease Brother    Cancer Brother        small cell lung cancer   Aneurysm Brother        AAA   Rheum arthritis Sister    Rheum arthritis Brother    Prostate cancer Neg Hx    Bladder Cancer Neg Hx    Kidney cancer Neg Hx    Family Psychiatric  History: DENIES Social History:  Social History   Substance and Sexual Activity  Alcohol Use No   Alcohol/week: 0.0 standard drinks of alcohol     Social History   Substance and Sexual Activity  Drug Use Yes   Types: Fentanyl, Hydrocodone    Social History   Socioeconomic History   Marital status: Married    Spouse name: Scarlette Calico   Number of children: 0   Years of education: College   Highest education level: Not on file  Occupational History   Occupation: Copywriter, advertising implants, crown and bridge-now disability 2006    Employer: UNEMPLOYED  Tobacco Use   Smoking status: Every Day    Packs/day: 1.00    Years: 57.00    Additional pack years: 0.00    Total pack years: 57.00    Types: Cigarettes, E-cigarettes    Start date: 06/06/1967    Last attempt to quit: 07/21/2019    Years since quitting: 3.2   Smokeless tobacco: Never   Tobacco comments:    stopped smoking a pipe in 2015  DOES SMOKE E CIG  Vaping Use   Vaping Use: Former  Substance and Sexual Activity   Alcohol use:  No    Alcohol/week: 0.0 standard drinks of alcohol   Drug use: Yes    Types: Fentanyl, Hydrocodone   Sexual activity: Not Currently  Other Topics Concern   Not on file  Social History Narrative   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    Caffeine: 2 cups coffee, 2 cups soda   Occupation: Copywriter, advertising implants, crowne and bridge, now disability 2006   Lives with wife, 1 dog, no children   Social Determinants of Health   Financial Resource Strain: High Risk (12/27/2020)    Overall Financial Resource Strain (CARDIA)    Difficulty of Paying Living Expenses: Hard  Food Insecurity: No Food Insecurity (09/28/2022)   Hunger Vital Sign    Worried About Running Out of Food in the Last Year: Never true    Ran Out of Food in the Last Year: Never true  Transportation Needs: No Transportation Needs (09/28/2022)   PRAPARE - Administrator, Civil Service (Medical): No    Lack of Transportation (Non-Medical): No  Physical Activity: Unknown (02/20/2018)   Exercise Vital Sign    Days of Exercise per Week: Patient declined    Minutes of Exercise per Session: Patient declined  Stress: Not on file  Social Connections: Unknown (02/20/2018)   Social Connection and Isolation Panel [NHANES]    Frequency of Communication with Friends and Family: Patient declined    Frequency of Social Gatherings with Friends and Family: Patient declined    Attends Religious Services: Patient declined    Database administrator or Organizations: Patient declined    Attends Banker Meetings: Patient declined    Marital Status: Patient declined   Additional Social History:    Allergies:   Allergies  Allergen Reactions   Statins Shortness Of Breath and Cough   Losartan Other (See Comments)    Caused him to have pain   Wellbutrin [Bupropion] Other (See Comments)    Worsened mood - crying   Allopurinol Nausea Only   Baclofen Nausea And Vomiting   Penicillins Nausea And Vomiting    Did it involve swelling of the face/tongue/throat, SOB, or low BP? N/A Did it involve sudden or severe rash/hives, skin peeling, or any reaction on the inside of your mouth or nose? N/A Did you need to seek medical attention at a hospital or doctor's office? N/A When did it last happen? Child     If all above answers are "NO", may proceed with cephalosporin use.   Tramadol Nausea Only    Labs:  Results for orders placed or performed during the hospital encounter of 10/13/22 (from the past  48 hour(s))  Comprehensive metabolic panel     Status: Abnormal   Collection Time: 10/13/22  5:32 PM  Result Value Ref Range   Sodium 135 135 - 145 mmol/L   Potassium 3.7 3.5 - 5.1 mmol/L   Chloride 99 98 - 111 mmol/L   CO2 29 22 - 32 mmol/L   Glucose, Bld 103 (H) 70 - 99 mg/dL    Comment: Glucose reference range applies only to samples taken after fasting for at least 8 hours.   BUN 24 (H) 8 - 23 mg/dL   Creatinine, Ser 0.98 0.61 - 1.24 mg/dL   Calcium 8.1 (L) 8.9 - 10.3 mg/dL   Total Protein 7.8 6.5 - 8.1 g/dL   Albumin 2.5 (L) 3.5 - 5.0 g/dL   AST 51 (H) 15 - 41 U/L   ALT 31 0 - 44 U/L  Alkaline Phosphatase 156 (H) 38 - 126 U/L   Total Bilirubin 2.2 (H) 0.3 - 1.2 mg/dL   GFR, Estimated >16 >10 mL/min    Comment: (NOTE) Calculated using the CKD-EPI Creatinine Equation (2021)    Anion gap 7 5 - 15    Comment: Performed at [facility], [address], [place], [place] [address]  Ethanol     Status: None   Collection Time: 10/13/22  5:32 PM  Result Value Ref Range   Alcohol, Ethyl (B) <10 <10 mg/dL    Comment: (NOTE) Lowest detectable limit for serum alcohol is 10 mg/dL.  For medical purposes only. Performed at [facility], [address] [place], [place] [address]   Salicylate level     Status: Abnormal   Collection Time: 10/13/22  5:32 PM  Result Value Ref Range   Salicylate Lvl <7.0 (L) 7.0 - 30.0 mg/dL    Comment: Performed at [facility], [address] [place], [place] [address]  Acetaminophen level     Status: Abnormal   Collection Time: 10/13/22  5:32 PM  Result Value Ref Range   Acetaminophen (Tylenol), Serum <10 (L) 10 - 30 ug/mL    Comment: (NOTE) Therapeutic concentrations vary significantly. A range of 10-30 ug/mL  may be an effective concentration for many patients. However, some  are best treated at concentrations outside of this range. Acetaminophen concentrations >150 ug/mL at 4 hours after  ingestion  and >50 ug/mL at 12 hours after ingestion are often associated with  toxic reactions.  Performed at [facility], [address] [place], [place] [address]   cbc     Status: Abnormal   Collection Time: 10/13/22  5:32 PM  Result Value Ref Range   WBC 6.6 4.0 - 10.5 K/uL   RBC 2.99 (L) 4.22 - 5.81 MIL/uL   Hemoglobin 10.8 (L) 13.0 - 17.0 g/dL   HCT 21.3 (L) 08.6 - 57.8 %   MCV 109.4 (H) 80.0 - 100.0 fL   MCH 36.1 (H) 26.0 - 34.0 pg   MCHC 33.0 30.0 - 36.0 g/dL   RDW 46.9 (H) 62.9 - 52.8 %   Platelets 35 (L) 150 - 400 K/uL    Comment: Immature Platelet Fraction may be clinically indicated, consider ordering this additional test [id number]    nRBC 0.0 0.0 - 0.2 %    Comment: Performed at [facility], [address] [place], [place] [address]  Rapid urine drug screen (hospital performed)     Status: None   Collection Time: 10/13/22  5:50 PM  Result Value Ref Range   Opiates NONE DETECTED NONE DETECTED   Cocaine NONE DETECTED NONE DETECTED   Benzodiazepines NONE DETECTED NONE DETECTED   Amphetamines NONE DETECTED NONE DETECTED   Tetrahydrocannabinol NONE DETECTED NONE DETECTED   Barbiturates NONE DETECTED NONE DETECTED    Comment: (NOTE) DRUG SCREEN FOR MEDICAL PURPOSES ONLY.  IF CONFIRMATION IS NEEDED FOR ANY PURPOSE, NOTIFY LAB WITHIN 5 DAYS.  LOWEST DETECTABLE LIMITS FOR URINE DRUG SCREEN Drug Class                     Cutoff (ng/mL) Amphetamine and metabolites    1000 Barbiturate and metabolites    200 Benzodiazepine                 200 Opiates and metabolites        300 Cocaine and metabolites  300 THC                            50 Performed at [facility] [address], [place], [place] [address]    *Note: Due to a large number of results and/or encounters for the requested time period, some results have not been displayed. A complete set of results can be found in Results Review.     Current Facility-Administered Medications  Medication Dose Route Frequency Provider Last Rate Last Admin   albuterol (PROVENTIL) (2.5 MG/3ML) 0.083% nebulizer solution 2.5 mg  2.5 mg Nebulization Q6H PRN Gerhard Munch, MD       carvedilol (COREG) tablet 3.125 mg  3.125 mg Oral BID Gerhard Munch, MD       furosemide (LASIX) tablet 40 mg  40 mg Oral Daily Gerhard Munch, MD       gabapentin (NEURONTIN) capsule 300 mg  300 mg Oral BID Gerhard Munch, MD       lactulose (CHRONULAC) 10 GM/15ML solution 20 g  20 g Oral TID Gerhard Munch, MD       nicotine (NICODERM CQ - dosed in mg/24 hours) patch 14 mg  14 mg Transdermal Daily Gerhard Munch, MD       oxyCODONE (Oxy IR/ROXICODONE) immediate release tablet 5 mg  5 mg Oral Q8H PRN Gerhard Munch, MD       pantoprazole (PROTONIX) EC tablet 40 mg  40 mg Oral Daily Gerhard Munch, MD       rifaximin Burman Blacksmith) tablet 550 mg  550 mg Oral BID Gerhard Munch, MD       spironolactone (ALDACTONE) tablet 12.5 mg  12.5 mg Oral Daily Gerhard Munch, MD       Melene Muller ON 10/14/2022] umeclidinium bromide (INCRUSE ELLIPTA) 62.5 MCG/ACT 1 puff  1 puff Inhalation Daily Gerhard Munch, MD       Current Outpatient Medications  Medication Sig Dispense Refill   albuterol (PROVENTIL) (2.5 MG/3ML) 0.083% nebulizer solution USE 1 VIAL PER NEBULIZER EVERY 6 HRS AS NEEDED FOR WHEEZING (Patient taking differently: Take 2.5 mg by nebulization every 6 (six) hours as needed for shortness of breath.) 75 mL 6   albuterol (VENTOLIN HFA) 108 (90 Base) MCG/ACT inhaler TAKE 2 PUFFS BY MOUTH EVERY 6 HOURS AS NEEDED FOR WHEEZE OR SHORTNESS OF BREATH (Patient taking differently: Inhale 1 puff into the lungs every 6 (six) hours as needed for wheezing or shortness of breath.) 6.7 each 00   carvedilol (COREG) 3.125 MG tablet Take 3.125 mg by mouth 2 (two) times daily.     fentaNYL (DURAGESIC) 75 MCG/HR Place 1 patch onto the skin every 3 (three) days. 1 patch 0    folic acid (FOLVITE) 1 MG tablet TAKE 1 TABLET BY MOUTH EVERY DAY (Patient taking differently: Take 1 mg by mouth at bedtime.) 90 tablet 3   gabapentin (NEURONTIN) 300 MG capsule Take 1 capsule (300 mg total) by mouth in the morning and at bedtime.     Misc Natural Products (TART CHERRY ADVANCED) CAPS Take 1 capsule by mouth in the morning and at bedtime.     nitroGLYCERIN (NITROSTAT) 0.4 MG SL tablet Place 1 tablet (0.4 mg total) under the tongue every 5 (five) minutes as needed for chest pain. (Patient taking differently: Place 0.4 mg under the tongue every 5 (five) minutes x 3 doses as needed for chest pain.) 25 tablet 12   ondansetron (ZOFRAN) 4 MG tablet TAKE 1  TABLET BY MOUTH EVERY 8 HOURS AS NEEDED FOR NAUSEA AND VOMITING (Patient taking differently: Take 4 mg by mouth every 8 (eight) hours as needed for nausea or vomiting.) 30 tablet 1   oxyCODONE (OXY IR/ROXICODONE) 5 MG immediate release tablet Take 1 tablet (5 mg total) by mouth every 8 (eight) hours as needed for severe pain. 10 tablet 0   pantoprazole (PROTONIX) 40 MG tablet Take 40 mg by mouth daily before breakfast.     vitamin B-12 (CYANOCOBALAMIN) 500 MCG tablet Take 1 tablet (500 mcg total) by mouth every Monday, Wednesday, and Friday.     DESITIN 40 % PSTE Apply 113 Applications topically continuous as needed (Use as needed).     ferrous sulfate 325 (65 FE) MG tablet Take 1 tablet (325 mg total) by mouth every other day.     furosemide (LASIX) 40 MG tablet Take 1 tablet (40 mg total) by mouth daily. 60 tablet 11   lactulose (CHRONULAC) 10 GM/15ML solution Take 30 mLs (20 g total) by mouth 3 (three) times daily. 236 mL 0   nicotine (NICODERM CQ - DOSED IN MG/24 HOURS) 14 mg/24hr patch APPLY 1 PATCH ONTO THE SKIN EVERY DAY (Patient taking differently: Place 14 mg onto the skin daily.) 28 patch 0   NYAMYC powder Apply 1 Application topically 3 (three) times daily.     spironolactone (ALDACTONE) 25 MG tablet Take 0.5 tablets (12.5 mg  total) by mouth daily.     TART CHERRY PO Take 1 tablet by mouth in the morning and at bedtime.     Tiotropium Bromide Monohydrate (SPIRIVA RESPIMAT) 2.5 MCG/ACT AERS Inhale 2 puffs into the lungs daily. 4 g 11   XIFAXAN 550 MG TABS tablet Take 550 mg by mouth 2 (two) times daily.      Musculoskeletal: Strength & Muscle Tone:  sitting on a stretcher during assessment Gait & Station:  see above Patient leans:  see above.   Psychiatric Specialty Exam: Presentation  General Appearance:  Disheveled; Other (comment) (Morbidly obese)  Eye Contact: Good  Speech: Clear and Coherent; Normal Rate  Speech Volume: Normal  Handedness: Right   Mood and Affect  Mood: Depressed  Affect: Congruent; Depressed   Thought Process  Thought Processes: Coherent; Goal Directed; Linear  Descriptions of Associations:Intact  Orientation:Full (Time, Place and Person)  Thought Content:Logical  History of Schizophrenia/Schizoaffective disorder:No data recorded Duration of Psychotic Symptoms:No data recorded Hallucinations:Hallucinations: None  Ideas of Reference:None  Suicidal Thoughts:Suicidal Thoughts: No  Homicidal Thoughts:Homicidal Thoughts: No   Sensorium  Memory: Immediate Good; Recent Good; Remote Good  Judgment: Good  Insight: Good   Executive Functions  Concentration: Good  Attention Span: Good  Recall: Good  Fund of Knowledge: Good  Language: Good   Psychomotor Activity  Psychomotor Activity: Psychomotor Activity: Normal   Assets  Assets: Communication Skills; Desire for Improvement; Housing    Sleep  Sleep: Sleep: Poor   Physical Exam: Physical Exam Vitals and nursing note reviewed.  Constitutional:      Appearance: He is obese.  HENT:     Head: Normocephalic.     Nose: Nose normal.  Cardiovascular:     Rate and Rhythm: Normal rate.  Pulmonary:     Effort: Pulmonary effort is normal.  Skin:    General: Skin is warm and  dry.  Neurological:     Mental Status: He is alert and oriented to person, place, and time.  Psychiatric:        Mood and Affect:  Mood is depressed.        Speech: Speech normal.        Behavior: Behavior normal. Behavior is cooperative.        Thought Content: Thought content normal.        Cognition and Memory: Cognition and memory normal.        Judgment: Judgment is impulsive.    Review of Systems  Constitutional: Negative.   HENT: Negative.    Eyes: Negative.   Respiratory: Negative.    Cardiovascular: Negative.   Gastrointestinal: Negative.   Genitourinary: Negative.   Skin: Negative.   Neurological: Negative.   Endo/Heme/Allergies: Negative.   Psychiatric/Behavioral:  Positive for depression and suicidal ideas.    Blood pressure 135/79, pulse 77, temperature 98.1 F (36.7 C), temperature source Oral, resp. rate 19, SpO2 96 %. There is no height or weight on file to calculate BMI.  Medical Decision Making: Patient denies SI/HI/AVH.  He regrets making the comment of wanting to hang himself. He admits that the divorce hurts him but sates he is accepting it.  We will keep patient overnight and reevaluate in am.  We will offer low dose Lexapro for depression and anxiety.  Problem 1: Recurrent Major Depressive disorder, severe without Psychotic features.  Disposition:  Monitor overnight and reevaluate in am.  Earney Navy, NP-PMHNP-BC 10/13/2022 7:28 PM

## 2022-10-13 NOTE — ED Notes (Signed)
Pharmacy called for 2200 dose rifaximin.

## 2022-10-13 NOTE — ED Triage Notes (Signed)
Pt BIBA from [facility]. Pt was threatening to hang themselves. Pt recent had a divorce and has been in rehab. Pt states they said those things in anger and grief. Pt does not currently endorse SI.   Pt c/o bleeding from scrotum.

## 2022-10-14 DIAGNOSIS — F332 Major depressive disorder, recurrent severe without psychotic features: Secondary | ICD-10-CM | POA: Diagnosis not present

## 2022-10-14 MED ORDER — ESCITALOPRAM OXALATE 10 MG PO TABS
10.0000 mg | ORAL_TABLET | Freq: Every day | ORAL | Status: DC
  Administered 2022-10-15 – 2022-10-16 (×2): 10 mg via ORAL
  Filled 2022-10-14 (×2): qty 1

## 2022-10-14 NOTE — Progress Notes (Signed)
[facility] Psych ED Progress Note  10/14/2022 12:18 PM [name]  MRN:  [id number]   Subjective:  [name] is a 67 y.o. male patient admitted with previous hx of depression was brought in from [facility]  by EMS after he threatened to hang himself.  Per Triage note patient had a recent Divorce and has been living in a Rehab center.  He has not been happy since he was taken there..  Today patient was seen in his room resting, awake and alert and endorsed feeling depressed.  Patient vehemently denied feeling suicidal and regret making suicidal comments yesterday.  Patient admitted that he was overwhelmed with anger and disappointment because his wife left him for another man.  Today also he states his two living sisters are his support.  He agreed to engage in counseling at the facility when he goes back.  Staff reported yesterday that they have therapist that comes to the facility.  Patient reported fair sleep last night.  He denies SI/HI/AVH and no mention of paranoia. Principal Problem: Severe episode of recurrent major depressive disorder, without psychotic features (HCC) Diagnosis:  Principal Problem:   Severe episode of recurrent major depressive disorder, without psychotic features [facility])   ED Assessment Time Calculation: Start Time: 1200 Stop Time: 1217 Total Time in Minutes (Assessment Completion): 17   Past Psychiatric History: see initial Psychiatry note  [place] Scale:  Flowsheet Row ED from 10/13/2022 in [facility] Emergency Department at [facility] ED to Hosp-Admission (Discharged) from 09/26/2022 in [facility] ED to Hosp-Admission (Discharged) from 09/22/2022 in [facility]  C-SSRS RISK CATEGORY No Risk No Risk No Risk       Past Medical History:  Past Medical History:  Diagnosis Date   AAA (abdominal aortic aneurysm) (HCC) 09/2012--  monitored by dr Myra Gianotti   stable 5.6cm CTA abdomen 2016    Abnormal drug screen 07/09/2016   1/2/018 - positive oxycodone, fentanyl, inapprop positive MJ - mod risk   Allergic rhinitis    Ascites 03/2019   B12 deficiency    CAD (coronary artery disease) cardiologist-  dr Jens Som   x3 with stents last 2005, EF 40%, predominantly RCA by CT 2016   Cataracts, bilateral    Cervical spondylosis 05/2010   s/p surgery   Charcot Hilda Lias Tooth muscular atrophy dx  1975   neurologist--  dr love--  type 2 per pt   Chronic pain syndrome    established with [facility] --> disagreement and transfered care to Dr Regenia Skeeter at [facility] clinic [facility], requests PCP write Rx but f/u with pain clinic Q6-12 months   COPD (chronic obstructive pulmonary disease) (HCC) 10/2011   minimal by PFTs   DDD (degenerative disc disease)    Disturbances of sensation of smell and taste    improving   Dyspnea on exertion    GERD (gastroesophageal reflux disease)    Gout    Headache    Hepatitis    hepatitis B   Hidradenitis    right groin   Hidradenitis suppurativa dx 2011   goin and leg crease   followd by Roxan Hockey - daily bactrim, s/p intralesional steroid injection 10/2010   Hip osteoarthritis    s/p intraarticular steroid shot (12/2012) ([place])   History of hepatitis B 1983   History of MI (myocardial infarction)    2000  &  2005   History of pneumonia  History of viral meningitis 2000   HLD (hyperlipidemia)    HTN (hypertension)    Ischemic cardiomyopathy    s/p inferior MI  --  current ef per myoview 39%   Liver cirrhosis secondary to NASH (HCC) 01/2014   by CT scan, rec virtual colonoscopy by Dr Loreta Ave 06/2014   Lumbar herniated disc    Myocardial infarction (HCC)    x2   Nocturia more than twice per night    Obesity    Spinal stenosis    released from NSG.  established with [facility] pain (07/2013)   T2DM (type 2 diabetes mellitus) (HCC)    ABIs WNL 2016   Vitamin D deficiency     Past Surgical History:  Procedure Laterality  Date   ABDOMINAL AORTIC ENDOVASCULAR FENESTRATED STENT GRAFT N/A 11/30/2015   Procedure: ABDOMINAL AORTIC ENDOVASCULAR FENESTRATED STENT GRAFT;  Surgeon: Nada Libman, MD;  Location: [facility] OR;  Service: Vascular;  Laterality: N/A;   ANTERIOR CERVICAL DECOMP/DISCECTOMY FUSION  01-07-2010    C4 -- C7   CARDIAC CATHETERIZATION  03-30-2005  dr Samule Ohm   ef 40% w/ inferior akinesis/  LM and CFX angiographically normal/  pLAD 30%/   Widely patent stents in RCA and PDA widely patent   CARDIOVASCULAR STRESS TEST  10-23-2012  dr Jens Som   No ischemia/  Moderate scar in the inferior wall, otherwise normal perfusion/  LV ef 39%,  LV wall motion: inferior/ inferolateral hypokinesis   COLONOSCOPY  05/06/2007   normal, small int hemorrhoids rpt 5 yrs due to fmhx - rec against rpt colonoscopy by Dr Loreta Ave   CORONARY ANGIOPLASTY  2000  dr Jens Som   PCI to RCA and PDA   CORONARY ANGIOPLASTY WITH STENT PLACEMENT  03-19-2005  dr Chrissie Noa downey   inferior STEMI--- DES x4 to RCA w/ balloon angioplasty and balloon angioplasty to jailed PDA ostium/  severe hypokinesis of midinferor wall, ef 50%/  dLM 20%,  mLAD 20%,  dCFX 60%   ESOPHAGOGASTRODUODENOSCOPY  01/2017   dilated benign esophageal stenosis, portal hypertensive gastropathy Marina Goodell)   ESOPHAGOGASTRODUODENOSCOPY (EGD) WITH PROPOFOL N/A 02/20/2018   benign biopsy Tobi Bastos, Sharlet Salina, MD)   ESOPHAGOGASTRODUODENOSCOPY (EGD) WITH PROPOFOL N/A 05/12/2019   Procedure: ESOPHAGOGASTRODUODENOSCOPY (EGD) WITH PROPOFOL;  Surgeon: Wyline Mood, MD;  Location: [facility] ENDOSCOPY;  Service: Gastroenterology;  Laterality: N/A;   HYDRADENITIS EXCISION Right 12/31/2014   Procedure: WIDE EXCISION HIDRADENITIS GROIN; Abigail Miyamoto, MD   IR PARACENTESIS  03/25/2019   LUMBAR DISC SURGERY     L5-S1   LUMBAR LAMINECTOMY  05-18-2010   L2--5   laminectomy/foraminotomy for stenosis ([place])   MYELOGRAM     L5-S1 and L1-2 spondylosis   SACROILIAC JOINT INJECTION Bilateral 10/2013   [facility]    TONSILLECTOMY AND ADENOIDECTOMY  1972   Family History:  Family History  Problem Relation Age of Onset   Cancer Mother        colon   Diabetes Mother    Kidney disease Mother    Aneurysm Mother        AAA   Rheum arthritis Mother    Charcot-Marie-Tooth disease Mother    Heart disease Mother        before age 62   Cancer Father        skin   Heart attack Father    Heart disease Father        before age 51   Cancer Brother        skin   Coronary artery disease Brother  Cancer Brother        small cell lung cancer   Aneurysm Brother        AAA   Rheum arthritis Sister    Rheum arthritis Brother    Prostate cancer Neg Hx    Bladder Cancer Neg Hx    Kidney cancer Neg Hx    Family Psychiatric  History: see initial Psychiatry note Social History:  Social History   Substance and Sexual Activity  Alcohol Use No   Alcohol/week: 0.0 standard drinks of alcohol     Social History   Substance and Sexual Activity  Drug Use Yes   Types: Fentanyl, Hydrocodone    Social History   Socioeconomic History   Marital status: Married    Spouse name: Scarlette Calico   Number of children: 0   Years of education: College   Highest education level: Not on file  Occupational History   Occupation: Copywriter, advertising implants, crown and bridge-now disability 2006    Employer: UNEMPLOYED  Tobacco Use   Smoking status: Every Day    Packs/day: 1.00    Years: 57.00    Additional pack years: 0.00    Total pack years: 57.00    Types: Cigarettes, E-cigarettes    Start date: 06/06/1967    Last attempt to quit: 07/21/2019    Years since quitting: 3.2   Smokeless tobacco: Never   Tobacco comments:    stopped smoking a pipe in 2015  DOES SMOKE E CIG  Vaping Use   Vaping Use: Former  Substance and Sexual Activity   Alcohol use: No    Alcohol/week: 0.0 standard drinks of alcohol   Drug use: Yes    Types: Fentanyl, Hydrocodone   Sexual activity: Not Currently  Other Topics Concern   Not on  file  Social History Narrative   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    Caffeine: 2 cups coffee, 2 cups soda   Occupation: Copywriter, advertising implants, crowne and bridge, now disability 2006   Lives with wife, 1 dog, no children   Social Determinants of Health   Financial Resource Strain: High Risk (12/27/2020)   Overall Financial Resource Strain (CARDIA)    Difficulty of Paying Living Expenses: Hard  Food Insecurity: No Food Insecurity (09/28/2022)   Hunger Vital Sign    Worried About Running Out of Food in the Last Year: Never true    Ran Out of Food in the Last Year: Never true  Transportation Needs: No Transportation Needs (09/28/2022)   PRAPARE - Administrator, Civil Service (Medical): No    Lack of Transportation (Non-Medical): No  Physical Activity: Unknown (02/20/2018)   Exercise Vital Sign    Days of Exercise per Week: Patient declined    Minutes of Exercise per Session: Patient declined  Stress: Not on file  Social Connections: Unknown (02/20/2018)   Social Connection and Isolation Panel [NHANES]    Frequency of Communication with Friends and Family: Patient declined    Frequency of Social Gatherings with Friends and Family: Patient declined    Attends Religious Services: Patient declined    Database administrator or Organizations: Patient declined    Attends Banker Meetings: Patient declined    Marital Status: Patient declined    Sleep: Fair  Appetite:  Fair  Current Medications: Current Facility-Administered Medications  Medication Dose Route Frequency Provider Last Rate Last Admin   albuterol (PROVENTIL) (2.5 MG/3ML) 0.083% nebulizer solution 2.5 mg  2.5 mg Nebulization Q6H PRN Gerhard Munch,  MD       carvedilol (COREG) tablet 3.125 mg  3.125 mg Oral BID Gerhard Munch, MD   3.125 mg at 10/13/22 2123   [START ON 10/15/2022] escitalopram (LEXAPRO) tablet 10 mg  10 mg Oral Daily Dahlia Byes C, NP       furosemide (LASIX) tablet  40 mg  40 mg Oral Daily Gerhard Munch, MD   40 mg at 10/13/22 2122   gabapentin (NEURONTIN) capsule 300 mg  300 mg Oral BID Gerhard Munch, MD   300 mg at 10/14/22 0947   lactulose (CHRONULAC) 10 GM/15ML solution 20 g  20 g Oral TID Gerhard Munch, MD   20 g at 10/14/22 0949   nicotine (NICODERM CQ - dosed in mg/24 hours) patch 14 mg  14 mg Transdermal Daily Gerhard Munch, MD       oxyCODONE (Oxy IR/ROXICODONE) immediate release tablet 5 mg  5 mg Oral Q8H PRN Gerhard Munch, MD   5 mg at 10/13/22 2122   pantoprazole (PROTONIX) EC tablet 40 mg  40 mg Oral Daily Gerhard Munch, MD   40 mg at 10/14/22 1003   rifaximin (XIFAXAN) tablet 550 mg  550 mg Oral BID Gerhard Munch, MD   550 mg at 10/14/22 4098   spironolactone (ALDACTONE) tablet 12.5 mg  12.5 mg Oral Daily Gerhard Munch, MD   12.5 mg at 10/13/22 2129   umeclidinium bromide (INCRUSE ELLIPTA) 62.5 MCG/ACT 1 puff  1 puff Inhalation Daily Gerhard Munch, MD   1 puff at 10/14/22 1191   Current Outpatient Medications  Medication Sig Dispense Refill   albuterol (PROVENTIL) (2.5 MG/3ML) 0.083% nebulizer solution USE 1 VIAL PER NEBULIZER EVERY 6 HRS AS NEEDED FOR WHEEZING (Patient taking differently: Take 2.5 mg by nebulization every 6 (six) hours as needed for shortness of breath.) 75 mL 6   albuterol (VENTOLIN HFA) 108 (90 Base) MCG/ACT inhaler TAKE 2 PUFFS BY MOUTH EVERY 6 HOURS AS NEEDED FOR WHEEZE OR SHORTNESS OF BREATH (Patient taking differently: Inhale 1 puff into the lungs every 6 (six) hours as needed for wheezing or shortness of breath.) 6.7 each 00   carvedilol (COREG) 3.125 MG tablet Take 3.125 mg by mouth 2 (two) times daily.     fentaNYL (DURAGESIC) 75 MCG/HR Place 1 patch onto the skin every 3 (three) days. 1 patch 0   ferrous sulfate 325 (65 FE) MG tablet Take 1 tablet (325 mg total) by mouth every other day.     folic acid (FOLVITE) 1 MG tablet TAKE 1 TABLET BY MOUTH EVERY DAY (Patient taking differently: Take 1  mg by mouth at bedtime.) 90 tablet 3   furosemide (LASIX) 40 MG tablet Take 1 tablet (40 mg total) by mouth daily. 60 tablet 11   gabapentin (NEURONTIN) 300 MG capsule Take 1 capsule (300 mg total) by mouth in the morning and at bedtime.     lactulose (CHRONULAC) 10 GM/15ML solution Take 30 mLs (20 g total) by mouth 3 (three) times daily. (Patient taking differently: Take 30 g by mouth 3 (three) times daily.) 236 mL 0   Misc Natural Products (TART CHERRY ADVANCED) CAPS Take 1 capsule by mouth in the morning and at bedtime.     nitroGLYCERIN (NITROSTAT) 0.4 MG SL tablet Place 1 tablet (0.4 mg total) under the tongue every 5 (five) minutes as needed for chest pain. (Patient taking differently: Place 0.4 mg under the tongue every 5 (five) minutes x 3 doses as needed for chest pain.) 25 tablet 12  ondansetron (ZOFRAN) 4 MG tablet TAKE 1 TABLET BY MOUTH EVERY 8 HOURS AS NEEDED FOR NAUSEA AND VOMITING (Patient taking differently: Take 4 mg by mouth every 8 (eight) hours as needed for nausea or vomiting.) 30 tablet 1   oxyCODONE (OXY IR/ROXICODONE) 5 MG immediate release tablet Take 1 tablet (5 mg total) by mouth every 8 (eight) hours as needed for severe pain. 10 tablet 0   OXYGEN Inhale 2 L/min into the lungs continuous.     pantoprazole (PROTONIX) 40 MG tablet Take 40 mg by mouth daily before breakfast.     spironolactone (ALDACTONE) 25 MG tablet Take 0.5 tablets (12.5 mg total) by mouth daily.     Tiotropium Bromide Monohydrate (SPIRIVA RESPIMAT) 2.5 MCG/ACT AERS Inhale 2 puffs into the lungs daily. 4 g 11   vitamin B-12 (CYANOCOBALAMIN) 500 MCG tablet Take 1 tablet (500 mcg total) by mouth every Monday, Wednesday, and Friday.     XIFAXAN 550 MG TABS tablet Take 550 mg by mouth 2 (two) times daily.     nicotine (NICODERM CQ - DOSED IN MG/24 HOURS) 14 mg/24hr patch APPLY 1 PATCH ONTO THE SKIN EVERY DAY (Patient not taking: Reported on 10/13/2022) 28 patch 0    Lab Results:  Results for orders placed or  performed during the hospital encounter of 10/13/22 (from the past 48 hour(s))  Comprehensive metabolic panel     Status: Abnormal   Collection Time: 10/13/22  5:32 PM  Result Value Ref Range   Sodium 135 135 - 145 mmol/L   Potassium 3.7 3.5 - 5.1 mmol/L   Chloride 99 98 - 111 mmol/L   CO2 29 22 - 32 mmol/L   Glucose, Bld 103 (H) 70 - 99 mg/dL    Comment: Glucose reference range applies only to samples taken after fasting for at least 8 hours.   BUN 24 (H) 8 - 23 mg/dL   Creatinine, Ser 9.14 0.61 - 1.24 mg/dL   Calcium 8.1 (L) 8.9 - 10.3 mg/dL   Total Protein 7.8 6.5 - 8.1 g/dL   Albumin 2.5 (L) 3.5 - 5.0 g/dL   AST 51 (H) 15 - 41 U/L   ALT 31 0 - 44 U/L   Alkaline Phosphatase 156 (H) 38 - 126 U/L   Total Bilirubin 2.2 (H) 0.3 - 1.2 mg/dL   GFR, Estimated >78 >29 mL/min    Comment: (NOTE) Calculated using the CKD-EPI Creatinine Equation (2021)    Anion gap 7 5 - 15    Comment: Performed at [facility], [address], [place], [place] [address]  Ethanol     Status: None   Collection Time: 10/13/22  5:32 PM  Result Value Ref Range   Alcohol, Ethyl (B) <10 <10 mg/dL    Comment: (NOTE) Lowest detectable limit for serum alcohol is 10 mg/dL.  For medical purposes only. Performed at [facility], [address], [place], [place] [address]   Salicylate level     Status: Abnormal   Collection Time: 10/13/22  5:32 PM  Result Value Ref Range   Salicylate Lvl <7.0 (L) 7.0 - 30.0 mg/dL    Comment: Performed at [facility], [address] [place], [place] [address]  Acetaminophen level     Status: Abnormal   Collection Time: 10/13/22  5:32 PM  Result Value Ref Range   Acetaminophen (Tylenol), Serum <10 (L) 10 - 30 ug/mL    Comment: (NOTE) Therapeutic concentrations vary significantly. A range of 10-30  ug/mL  may be an effective concentration for many patients. However, some  are best treated at concentrations outside of  this range. Acetaminophen concentrations >150 ug/mL at 4 hours after ingestion  and >50 ug/mL at 12 hours after ingestion are often associated with  toxic reactions.  Performed at [facility], [address], [place], [place] [address]   cbc     Status: Abnormal   Collection Time: 10/13/22  5:32 PM  Result Value Ref Range   WBC 6.6 4.0 - 10.5 K/uL   RBC 2.99 (L) 4.22 - 5.81 MIL/uL   Hemoglobin 10.8 (L) 13.0 - 17.0 g/dL   HCT 62.1 (L) 30.8 - 65.7 %   MCV 109.4 (H) 80.0 - 100.0 fL   MCH 36.1 (H) 26.0 - 34.0 pg   MCHC 33.0 30.0 - 36.0 g/dL   RDW 84.6 (H) 96.2 - 95.2 %   Platelets 35 (L) 150 - 400 K/uL    Comment: Immature Platelet Fraction may be clinically indicated, consider ordering this additional test [id number]    nRBC 0.0 0.0 - 0.2 %    Comment: Performed at [facility], [address] [place], [place] [address]  Rapid urine drug screen (hospital performed)     Status: None   Collection Time: 10/13/22  5:50 PM  Result Value Ref Range   Opiates NONE DETECTED NONE DETECTED   Cocaine NONE DETECTED NONE DETECTED   Benzodiazepines NONE DETECTED NONE DETECTED   Amphetamines NONE DETECTED NONE DETECTED   Tetrahydrocannabinol NONE DETECTED NONE DETECTED   Barbiturates NONE DETECTED NONE DETECTED    Comment: (NOTE) DRUG SCREEN FOR MEDICAL PURPOSES ONLY.  IF CONFIRMATION IS NEEDED FOR ANY PURPOSE, NOTIFY LAB WITHIN 5 DAYS.  LOWEST DETECTABLE LIMITS FOR URINE DRUG SCREEN Drug Class                     Cutoff (ng/mL) Amphetamine and metabolites    1000 Barbiturate and metabolites    200 Benzodiazepine                 200 Opiates and metabolites        300 Cocaine and metabolites        300 THC                            50 Performed at [facility], [address], [place], [place] [address]    *Note: Due to a large number of results and/or encounters for the requested time period, some results have not been  displayed. A complete set of results can be found in Results Review.    Blood Alcohol level:  Lab Results  Component Value Date   ETH <10 10/13/2022   ETH <10 09/22/2022    Physical Findings:  CIWA:    COWS:     Musculoskeletal: Strength & Muscle Tone:  seen lying down in bed  Gait & Station:  in bed lying down Patient leans:  see above  Psychiatric Specialty Exam:  Presentation  General Appearance:  Casual; Disheveled; Other (comment) (Morbidly Obese)  Eye Contact: Good  Speech: Clear and Coherent; Normal Rate  Speech Volume: Normal  Handedness: Right   Mood and Affect  Mood: Anxious; Depressed  Affect: Appropriate; Congruent; Depressed   Thought Process  Thought Processes: Coherent; Goal Directed; Linear  Descriptions of Associations:Intact  Orientation:Full (Time, Place and Person)  Thought Content:Logical  History of  Schizophrenia/Schizoaffective disorder:No data recorded Duration of Psychotic Symptoms:No data recorded Hallucinations:Hallucinations: None  Ideas of Reference:None  Suicidal Thoughts:Suicidal Thoughts: No  Homicidal Thoughts:Homicidal Thoughts: No   Sensorium  Memory: Immediate Good; Recent Good; Remote Good  Judgment: Fair  Insight: Good   Executive Functions  Concentration: Good  Attention Span: Good  Recall: Good  Fund of Knowledge: Good  Language: Good   Psychomotor Activity  Psychomotor Activity: Psychomotor Activity: Normal   Assets  Assets: Communication Skills; Desire for Improvement; Housing; Resilience   Sleep  Sleep: Sleep: Fair    Physical Exam: Physical Exam Vitals and nursing note reviewed.  Constitutional:      Appearance: Normal appearance.  HENT:     Head: Normocephalic.     Nose: Nose normal.  Cardiovascular:     Rate and Rhythm: Normal rate.  Pulmonary:     Effort: Pulmonary effort is normal.  Musculoskeletal:     Cervical back: Normal range of motion.   Skin:    General: Skin is warm and dry.  Neurological:     General: No focal deficit present.     Mental Status: He is alert and oriented to person, place, and time.  Psychiatric:        Attention and Perception: Attention and perception normal.        Mood and Affect: Mood is anxious and depressed.        Speech: Speech normal.        Behavior: Behavior normal. Behavior is cooperative.        Thought Content: Thought content normal.        Cognition and Memory: Cognition and memory normal.        Judgment: Judgment normal.    Review of Systems  Constitutional: Negative.   HENT: Negative.    Eyes: Negative.   Respiratory: Negative.    Cardiovascular: Negative.   Gastrointestinal: Negative.   Genitourinary: Negative.   Skin: Negative.   Neurological: Negative.   Endo/Heme/Allergies: Negative.   Psychiatric/Behavioral:  Positive for depression. The patient is nervous/anxious and has insomnia.    Blood pressure (!) 95/58, pulse 73, temperature 98.8 F (37.1 C), temperature source Oral, resp. rate 20, SpO2 100 %. There is no height or weight on file to calculate BMI.   Medical Decision Making: Patient remains depressed and rated depression 5/10 with 10 being severe depression.  Patient is on 5 mg Lexapro and same is increased to 10 mg from tomorrow.  He vehemently denies feeling suicidal.  He is willing to go back to [facility] facility tomorrow.  We will evaluate in am and discharge. Disposition:  Monitor overnight and reevaluate in am.   Earney Navy, NP-PMHNP-BC 10/14/2022, 12:18 PM

## 2022-10-14 NOTE — ED Provider Notes (Signed)
Emergency Medicine Observation Re-evaluation Note  [name] is a 67 y.o. male, seen on rounds today.  Pt initially presented to the ED for complaints of Depression Currently, the patient is resting comfortably.  Physical Exam  BP 119/70 (BP Location: Right Arm)   Pulse 88   Temp 98.5 F (36.9 C) (Oral)   Resp 20   SpO2 95%  Physical Exam General: NAD   ED Course / MDM  EKG:   I have reviewed the labs performed to date as well as medications administered while in observation.  Recent changes in the last 24 hours include no acute events reported.  Plan  Current plan is for psych evaluation/placement.    Wynetta Fines, MD 10/14/22 [phone]

## 2022-10-15 MED ORDER — NICOTINE 14 MG/24HR TD PT24: 14.0000 mg | MEDICATED_PATCH | Freq: Every day | TRANSDERMAL | 0 refills | Status: DC

## 2022-10-15 MED ORDER — ESCITALOPRAM OXALATE 10 MG PO TABS: 10.0000 mg | ORAL_TABLET | Freq: Every day | ORAL | 0 refills | Status: AC

## 2022-10-15 NOTE — BH Assessment (Signed)
Disposition Note:   Patient was evaluated by Dahlia Byes, NP, 10/14/2022, and recommended for overnight observation.   Patient awaiting a psychiatric assessment for today, 10/15/2022 to determine his dispositions.

## 2022-10-15 NOTE — ED Provider Notes (Signed)
Emergency Medicine Observation Re-evaluation Note  [name] is a 67 y.o. male, seen on rounds today.  Pt initially presented to the ED for complaints of Depression Currently, the patient is resting.  Physical Exam  BP 112/61 (BP Location: Right Arm)   Pulse 75   Temp 97.9 F (36.6 C) (Oral)   Resp 16   SpO2 94%  Physical Exam General: NAD   ED Course / MDM  EKG:EKG Interpretation  Date/Time:  Friday Oct 13 2022 19:44:59 EDT Ventricular Rate:  77 PR Interval:  160 QRS Duration: 121 QT Interval:  447 QTC Calculation: 506 R Axis:   63 Text Interpretation: Sinus rhythm Nonspecific intraventricular conduction delay Inferior infarct, old Confirmed by Arby Barrette [phone]) on 10/14/2022 4:44:01 PM  I have reviewed the labs performed to date as well as medications administered while in observation.  Recent changes in the last 24 hours include no acute events reported.  Plan  Current plan is for continued observation and possible return to [facility] tomorrow.    Wynetta Fines, MD 10/15/22 [phone]

## 2022-10-15 NOTE — Progress Notes (Addendum)
This CSW has reached out to [place] at [facility], she has reported to this CSW that the patient can return on 5/13.  At this time there are no further details in regards to a time. TOC will need to follow up in the AM for DC details.

## 2022-10-15 NOTE — Progress Notes (Signed)
TOC CSW spoke with Diplomatic Services operational officer at [employer] and CSW left HIPPA compliant message with my contact information for [name]  Donelle Hise Tarpley-Carter, MSW, LCSW-A Pronouns:  She/Her/Hers [facility] Clinical Social Worker Direct Number:  [phone] [email or web address]

## 2022-10-15 NOTE — Progress Notes (Signed)
TOC CSW spoke with secretary at [employer] and CSW left HIPPA compliant message with my contact information for [name]  [name], MSW, LCSW-A Pronouns:  She/Her/Hers [facility] Clinical Social Worker Direct Number:  [phone] [name]  

## 2022-10-15 NOTE — Discharge Summary (Signed)
[facility] Psych ED Discharge  10/15/2022 12:01 PM [name]  MRN:  [id number]  Principal Problem: Severe episode of recurrent major depressive disorder, without psychotic features [facility]) Discharge Diagnoses: Principal Problem:   Severe episode of recurrent major depressive disorder, without psychotic features (HCC)  Clinical Impression:  Final diagnoses:  Suicidal ideations   Subjective:  [name] is a 67 y.o. male patient admitted with previous hx of depression was brought in from [facility]  by EMS after he threatened to hang himself.  Per Triage note patient had a recent Divorce and has been living in a Rehab center.  He has not been happy since he was taken there to live. Patient was started on Lexapro on arrival for depression and anxiety.  Lab work were reviewed.  Patient was monitored since his arrival for safety.  He has denied suicide ideation or thought since arrival.  He regrets saying he was going to hang himself due to his marital issue and ending of his Marriage.  Patient states that he has two sisters that cares about him.  He vehemently denies SI/HI/AVH and no mention of paranoia.   Male, 67 years old brought in for endorsing suicide by hanging due his wife leaving him for another man.  He has since changed his mind and has agreed to engage in counseling/therapy at the facility.  He has been sleeping well and states appetite is fairly ok.  He is going back to the facility-[facility]  and is Psychiatrically cleared.  TOC consult to assist with his return back to thye facility is in.  DR Viviano Simas is in agreement with discharge plan.  ED Assessment Time Calculation: Start Time: 1130 Stop Time: 1150 Total Time in Minutes (Assessment Completion): 20   Past Psychiatric History: see initial Psychiatry evaluation note  Past Medical History:  Past Medical History:  Diagnosis Date   AAA (abdominal aortic aneurysm) (HCC) 09/2012--  monitored by dr Myra Gianotti    stable 5.6cm CTA abdomen 2016   Abnormal drug screen 07/09/2016   1/2/018 - positive oxycodone, fentanyl, inapprop positive MJ - mod risk   Allergic rhinitis    Ascites 03/2019   B12 deficiency    CAD (coronary artery disease) cardiologist-  dr Jens Som   x3 with stents last 2005, EF 40%, predominantly RCA by CT 2016   Cataracts, bilateral    Cervical spondylosis 05/2010   s/p surgery   Charcot Hilda Lias Tooth muscular atrophy dx  1975   neurologist--  dr love--  type 2 per pt   Chronic pain syndrome    established with [facility] ([place]) --> disagreement and transfered care to Dr Regenia Skeeter at [facility] pain clinic [facility], requests PCP write Rx but f/u with pain clinic Q6-12 months   COPD (chronic obstructive pulmonary disease) (HCC) 10/2011   minimal by PFTs   DDD (degenerative disc disease)    Disturbances of sensation of smell and taste    improving   Dyspnea on exertion    GERD (gastroesophageal reflux disease)    Gout    Headache    Hepatitis    hepatitis B   Hidradenitis    right groin   Hidradenitis suppurativa dx 2011   goin and leg crease   followd by Roxan Hockey - daily bactrim, s/p intralesional steroid injection 10/2010   Hip osteoarthritis    s/p intraarticular steroid shot (12/2012) (Ibazebo/Caffrey)   History of hepatitis B 1983   History of MI (myocardial infarction)  2000  &  2005   History of pneumonia    History of viral meningitis 2000   HLD (hyperlipidemia)    HTN (hypertension)    Ischemic cardiomyopathy    s/p inferior MI  --  current ef per myoview 39%   Liver cirrhosis secondary to NASH (HCC) 01/2014   by CT scan, rec virtual colonoscopy by Dr Loreta Ave 06/2014   Lumbar herniated disc    Myocardial infarction (HCC)    x2   Nocturia more than twice per night    Obesity    Spinal stenosis    released from NSG.  established with [facility] (07/2013)   T2DM (type 2 diabetes mellitus) (HCC)    ABIs WNL 2016   Vitamin D deficiency     Past  Surgical History:  Procedure Laterality Date   ABDOMINAL AORTIC ENDOVASCULAR FENESTRATED STENT GRAFT N/A 11/30/2015   Procedure: ABDOMINAL AORTIC ENDOVASCULAR FENESTRATED STENT GRAFT;  Surgeon: Nada Libman, MD;  Location: [facility] OR;  Service: Vascular;  Laterality: N/A;   ANTERIOR CERVICAL DECOMP/DISCECTOMY FUSION  01-07-2010    C4 -- C7   CARDIAC CATHETERIZATION  03-30-2005  dr Samule Ohm   ef 40% w/ inferior akinesis/  LM and CFX angiographically normal/  pLAD 30%/   Widely patent stents in RCA and PDA widely patent   CARDIOVASCULAR STRESS TEST  10-23-2012  dr Jens Som   No ischemia/  Moderate scar in the inferior wall, otherwise normal perfusion/  LV ef 39%,  LV wall motion: inferior/ inferolateral hypokinesis   COLONOSCOPY  05/06/2007   normal, small int hemorrhoids rpt 5 yrs due to fmhx - rec against rpt colonoscopy by Dr Loreta Ave   CORONARY ANGIOPLASTY  2000  dr Jens Som   PCI to RCA and PDA   CORONARY ANGIOPLASTY WITH STENT PLACEMENT  03-19-2005  dr Chrissie Noa downey   inferior STEMI--- DES x4 to RCA w/ balloon angioplasty and balloon angioplasty to jailed PDA ostium/  severe hypokinesis of midinferor wall, ef 50%/  dLM 20%,  mLAD 20%,  dCFX 60%   ESOPHAGOGASTRODUODENOSCOPY  01/2017   dilated benign esophageal stenosis, portal hypertensive gastropathy Marina Goodell)   ESOPHAGOGASTRODUODENOSCOPY (EGD) WITH PROPOFOL N/A 02/20/2018   benign biopsy Tobi Bastos, Sharlet Salina, MD)   ESOPHAGOGASTRODUODENOSCOPY (EGD) WITH PROPOFOL N/A 05/12/2019   Procedure: ESOPHAGOGASTRODUODENOSCOPY (EGD) WITH PROPOFOL;  Surgeon: Wyline Mood, MD;  Location: [facility] ENDOSCOPY;  Service: Gastroenterology;  Laterality: N/A;   HYDRADENITIS EXCISION Right 12/31/2014   Procedure: WIDE EXCISION HIDRADENITIS GROIN; Abigail Miyamoto, MD   IR PARACENTESIS  03/25/2019   LUMBAR DISC SURGERY     L5-S1   LUMBAR LAMINECTOMY  05-18-2010   L2--5   laminectomy/foraminotomy for stenosis ([place])   MYELOGRAM     L5-S1 and L1-2 spondylosis   SACROILIAC JOINT  INJECTION Bilateral 10/2013   [place]   TONSILLECTOMY AND ADENOIDECTOMY  1972   Family History:  Family History  Problem Relation Age of Onset   Cancer Mother        colon   Diabetes Mother    Kidney disease Mother    Aneurysm Mother        AAA   Rheum arthritis Mother    Charcot-Marie-Tooth disease Mother    Heart disease Mother        before age 64   Cancer Father        skin   Heart attack Father    Heart disease Father        before age 61   Cancer Brother  skin   Coronary artery disease Brother    Cancer Brother        small cell lung cancer   Aneurysm Brother        AAA   Rheum arthritis Sister    Rheum arthritis Brother    Prostate cancer Neg Hx    Bladder Cancer Neg Hx    Kidney cancer Neg Hx    Family Psychiatric  History: see initial Psychiatry evaluation note Social History:  Social History   Substance and Sexual Activity  Alcohol Use No   Alcohol/week: 0.0 standard drinks of alcohol     Social History   Substance and Sexual Activity  Drug Use Yes   Types: Fentanyl, Hydrocodone    Social History   Socioeconomic History   Marital status: Married    Spouse name: Scarlette Calico   Number of children: 0   Years of education: College   Highest education level: Not on file  Occupational History   Occupation: Copywriter, advertising implants, [employer]-now disability 2006    Employer: UNEMPLOYED  Tobacco Use   Smoking status: Every Day    Packs/day: 1.00    Years: 57.00    Additional pack years: 0.00    Total pack years: 57.00    Types: Cigarettes, E-cigarettes    Start date: 06/06/1967    Last attempt to quit: 07/21/2019    Years since quitting: 3.2   Smokeless tobacco: Never   Tobacco comments:    stopped smoking a pipe in 2015  DOES SMOKE E CIG  Vaping Use   Vaping Use: Former  Substance and Sexual Activity   Alcohol use: No    Alcohol/week: 0.0 standard drinks of alcohol   Drug use: Yes    Types: Fentanyl, Hydrocodone   Sexual  activity: Not Currently  Other Topics Concern   Not on file  Social History Narrative   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    Caffeine: 2 cups coffee, 2 cups soda   Occupation: Copywriter, advertising implants, crowne and bridge, now disability 2006   Lives with wife, 1 dog, no children   Social Determinants of Health   Financial Resource Strain: High Risk (12/27/2020)   Overall Financial Resource Strain (CARDIA)    Difficulty of Paying Living Expenses: Hard  Food Insecurity: No Food Insecurity (09/28/2022)   Hunger Vital Sign    Worried About Running Out of Food in the Last Year: Never true    Ran Out of Food in the Last Year: Never true  Transportation Needs: No Transportation Needs (09/28/2022)   PRAPARE - Administrator, Civil Service (Medical): No    Lack of Transportation (Non-Medical): No  Physical Activity: Unknown (02/20/2018)   Exercise Vital Sign    Days of Exercise per Week: Patient declined    Minutes of Exercise per Session: Patient declined  Stress: Not on file  Social Connections: Unknown (02/20/2018)   Social Connection and Isolation Panel [NHANES]    Frequency of Communication with Friends and Family: Patient declined    Frequency of Social Gatherings with Friends and Family: Patient declined    Attends Religious Services: Patient declined    Database administrator or Organizations: Patient declined    Attends Banker Meetings: Patient declined    Marital Status: Patient declined    Tobacco Cessation:  A prescription for an FDA-approved tobacco cessation medication provided at discharge  Current Medications: Current Facility-Administered Medications  Medication Dose Route Frequency Provider Last Rate Last Admin  albuterol (PROVENTIL) (2.5 MG/3ML) 0.083% nebulizer solution 2.5 mg  2.5 mg Nebulization Q6H PRN Gerhard Munch, MD       carvedilol (COREG) tablet 3.125 mg  3.125 mg Oral BID Gerhard Munch, MD   3.125 mg at 10/15/22 1045    escitalopram (LEXAPRO) tablet 10 mg  10 mg Oral Daily Dahlia Byes C, NP   10 mg at 10/15/22 1045   furosemide (LASIX) tablet 40 mg  40 mg Oral Daily Gerhard Munch, MD   40 mg at 10/15/22 1045   gabapentin (NEURONTIN) capsule 300 mg  300 mg Oral BID Gerhard Munch, MD   300 mg at 10/15/22 1045   lactulose (CHRONULAC) 10 GM/15ML solution 20 g  20 g Oral TID Gerhard Munch, MD   20 g at 10/15/22 1046   nicotine (NICODERM CQ - dosed in mg/24 hours) patch 14 mg  14 mg Transdermal Daily Gerhard Munch, MD       oxyCODONE (Oxy IR/ROXICODONE) immediate release tablet 5 mg  5 mg Oral Q8H PRN Gerhard Munch, MD   5 mg at 10/14/22 2205   pantoprazole (PROTONIX) EC tablet 40 mg  40 mg Oral Daily Gerhard Munch, MD   40 mg at 10/15/22 1046   rifaximin (XIFAXAN) tablet 550 mg  550 mg Oral BID Gerhard Munch, MD   550 mg at 10/15/22 1045   spironolactone (ALDACTONE) tablet 12.5 mg  12.5 mg Oral Daily Gerhard Munch, MD   12.5 mg at 10/15/22 1045   umeclidinium bromide (INCRUSE ELLIPTA) 62.5 MCG/ACT 1 puff  1 puff Inhalation Daily Gerhard Munch, MD   1 puff at 10/15/22 1610   Current Outpatient Medications  Medication Sig Dispense Refill   albuterol (PROVENTIL) (2.5 MG/3ML) 0.083% nebulizer solution USE 1 VIAL PER NEBULIZER EVERY 6 HRS AS NEEDED FOR WHEEZING (Patient taking differently: Take 2.5 mg by nebulization every 6 (six) hours as needed for shortness of breath.) 75 mL 6   albuterol (VENTOLIN HFA) 108 (90 Base) MCG/ACT inhaler TAKE 2 PUFFS BY MOUTH EVERY 6 HOURS AS NEEDED FOR WHEEZE OR SHORTNESS OF BREATH (Patient taking differently: Inhale 1 puff into the lungs every 6 (six) hours as needed for wheezing or shortness of breath.) 6.7 each 00   carvedilol (COREG) 3.125 MG tablet Take 3.125 mg by mouth 2 (two) times daily.     fentaNYL (DURAGESIC) 75 MCG/HR Place 1 patch onto the skin every 3 (three) days. 1 patch 0   ferrous sulfate 325 (65 FE) MG tablet Take 1 tablet (325 mg total) by  mouth every other day.     folic acid (FOLVITE) 1 MG tablet TAKE 1 TABLET BY MOUTH EVERY DAY (Patient taking differently: Take 1 mg by mouth at bedtime.) 90 tablet 3   furosemide (LASIX) 40 MG tablet Take 1 tablet (40 mg total) by mouth daily. 60 tablet 11   gabapentin (NEURONTIN) 300 MG capsule Take 1 capsule (300 mg total) by mouth in the morning and at bedtime.     lactulose (CHRONULAC) 10 GM/15ML solution Take 30 mLs (20 g total) by mouth 3 (three) times daily. (Patient taking differently: Take 30 g by mouth 3 (three) times daily.) 236 mL 0   Misc Natural Products (TART CHERRY ADVANCED) CAPS Take 1 capsule by mouth in the morning and at bedtime.     nitroGLYCERIN (NITROSTAT) 0.4 MG SL tablet Place 1 tablet (0.4 mg total) under the tongue every 5 (five) minutes as needed for chest pain. (Patient taking differently: Place 0.4 mg under  the tongue every 5 (five) minutes x 3 doses as needed for chest pain.) 25 tablet 12   ondansetron (ZOFRAN) 4 MG tablet TAKE 1 TABLET BY MOUTH EVERY 8 HOURS AS NEEDED FOR NAUSEA AND VOMITING (Patient taking differently: Take 4 mg by mouth every 8 (eight) hours as needed for nausea or vomiting.) 30 tablet 1   oxyCODONE (OXY IR/ROXICODONE) 5 MG immediate release tablet Take 1 tablet (5 mg total) by mouth every 8 (eight) hours as needed for severe pain. 10 tablet 0   OXYGEN Inhale 2 L/min into the lungs continuous.     pantoprazole (PROTONIX) 40 MG tablet Take 40 mg by mouth daily before breakfast.     spironolactone (ALDACTONE) 25 MG tablet Take 0.5 tablets (12.5 mg total) by mouth daily.     Tiotropium Bromide Monohydrate (SPIRIVA RESPIMAT) 2.5 MCG/ACT AERS Inhale 2 puffs into the lungs daily. 4 g 11   vitamin B-12 (CYANOCOBALAMIN) 500 MCG tablet Take 1 tablet (500 mcg total) by mouth every Monday, Wednesday, and Friday.     XIFAXAN 550 MG TABS tablet Take 550 mg by mouth 2 (two) times daily.     [START ON 10/16/2022] escitalopram (LEXAPRO) 10 MG tablet Take 1 tablet (10  mg total) by mouth daily. 30 tablet 0   [START ON 10/16/2022] nicotine (NICODERM CQ - DOSED IN MG/24 HOURS) 14 mg/24hr patch Place 1 patch (14 mg total) onto the skin daily. 28 patch 0   PTA Medications: (Not in a hospital admission)   [place] Scale:  Flowsheet Row ED from 10/13/2022 in [facility] Emergency Department at [facility] ED to Hosp-Admission (Discharged) from 09/26/2022 in [place] [facility] ED to Hosp-Admission (Discharged) from 09/22/2022 in [place] [facility]  C-SSRS RISK CATEGORY No Risk No Risk No Risk       Musculoskeletal: Strength & Muscle Tone: decreased Gait & Station: unsteady Patient leans: Front and needs some assistance  Psychiatric Specialty Exam: Presentation  General Appearance:  Casual; Disheveled; Other (comment) (Morbidly Obese)  Eye Contact: Good  Speech: Clear and Coherent; Normal Rate  Speech Volume: Normal  Handedness: Right   Mood and Affect  Mood: Anxious; Depressed  Affect: Appropriate; Congruent; Depressed   Thought Process  Thought Processes: Coherent; Goal Directed; Linear  Descriptions of Associations:Intact  Orientation:Full (Time, Place and Person)  Thought Content:Logical  History of Schizophrenia/Schizoaffective disorder:No data recorded Duration of Psychotic Symptoms:No data recorded Hallucinations:Hallucinations: None  Ideas of Reference:None  Suicidal Thoughts:Suicidal Thoughts: No  Homicidal Thoughts:Homicidal Thoughts: No   Sensorium  Memory: Immediate Good; Recent Good; Remote Good  Judgment: Fair  Insight: Good   Executive Functions  Concentration: Good  Attention Span: Good  Recall: Good  Fund of Knowledge: Good  Language: Good   Psychomotor Activity  Psychomotor Activity: Psychomotor Activity: Normal   Assets  Assets: Communication Skills; Desire for Improvement; Housing; Resilience   Sleep  Sleep: Sleep:  Fair    Physical Exam: Physical Exam Vitals and nursing note reviewed.  Constitutional:      Appearance: He is obese.  HENT:     Head: Normocephalic.     Nose: Nose normal.  Cardiovascular:     Rate and Rhythm: Normal rate and regular rhythm.  Pulmonary:     Effort: Pulmonary effort is normal.  Musculoskeletal:     Cervical back: Normal range of motion.  Skin:    General: Skin is warm and dry.  Neurological:     General: No focal  deficit present.     Mental Status: He is alert and oriented to person, place, and time.  Psychiatric:        Attention and Perception: Attention and perception normal.        Mood and Affect: Mood and affect normal.        Speech: Speech normal.        Behavior: Behavior normal. Behavior is cooperative.        Thought Content: Thought content normal.        Cognition and Memory: Cognition and memory normal.        Judgment: Judgment normal.    Review of Systems  Constitutional: Negative.   HENT: Negative.    Eyes: Negative.   Respiratory: Negative.    Cardiovascular: Negative.   Gastrointestinal: Negative.   Genitourinary: Negative.   Musculoskeletal:        Unsteady gait, poor mobility.  Skin: Negative.   Neurological: Negative.   Endo/Heme/Allergies: Negative.   Psychiatric/Behavioral:  Positive for depression.    Blood pressure 112/61, pulse 75, temperature 97.9 F (36.6 C), temperature source Oral, resp. rate 16, SpO2 94 %. There is no height or weight on file to calculate BMI.   Demographic Factors:  Male, Age 49 or older, Divorced or widowed, Caucasian, and Low socioeconomic status  Loss Factors: Loss of significant relationship and Decline in physical health  Historical Factors: NA  Risk Reduction Factors:   Positive social support  Continued Clinical Symptoms:  Depression:   Insomnia Unstable or Poor Therapeutic Relationship  Cognitive Features That Contribute To Risk:  None    Suicide Risk:  Minimal: No  identifiable suicidal ideation.  Patients presenting with no risk factors but with morbid ruminations; may be classified as minimal risk based on the severity of the depressive symptoms    Plan Of Care/Follow-up recommendations:  Activity:  as tolerated Diet:  Regular  Medical Decision Making: Patient rates Depression 3/10 with 10 being severe depression.  He denies SI/HI/AVH  He is being sent home on Lexapro 10 mg daily for depression and anxiety.  We discussed the need for counseling at the facility and he is in agreement for that.  He is willing to go back to [facility] facility.  Patient is Psychiatrically cleared.  TOC consult is placed. Disposition: Psychiatrically cleared. Earney Navy, NP-PMHNP-BC 10/15/2022, 12:01 PM

## 2022-10-15 NOTE — Progress Notes (Signed)
TOC CSW spoke with Toniann Fail at [facility] [phone].  Toniann Fail states its been over 24 hours and pt would have to be readmitted.  Pt can't return today because their is no one to admit him.  Toniann Fail states to call her on Monday, 10/16/2022 and they will be happy to readmit pt.  Antionne Enrique Tarpley-Carter, MSW, LCSW-A Pronouns:  She/Her/Hers [facility] HealthTransitions of Care Clinical Social Worker Direct Number:  [phone] [email or web address]

## 2022-10-16 DIAGNOSIS — R531 Weakness: Secondary | ICD-10-CM | POA: Diagnosis not present

## 2022-10-16 DIAGNOSIS — Z7401 Bed confinement status: Secondary | ICD-10-CM | POA: Diagnosis not present

## 2022-10-16 NOTE — Progress Notes (Addendum)
This CSW has outreached to [facility] to inquire about a time pt may return to facility. TOC following.   Addend @ 10:16 AM SNF is ready to receive pt back. AVS faxed to [facility] @ [facility]. RN notified of report info. PTAR to transport.

## 2022-10-16 NOTE — ED Provider Notes (Signed)
Emergency Medicine Observation Re-evaluation Note  [name] is a 67 y.o. male, seen on rounds today.  Pt initially presented to the ED for complaints of Depression Currently, the patient is with SI with plan.  Physical Exam  BP 104/75 (BP Location: Right Arm)   Pulse 78   Temp 98.4 F (36.9 C) (Oral)   Resp 19   SpO2 100%  Physical Exam General: Appears to be resting comfortably in bed, no acute distress. Cardiac: Regular rate, normal heart rate, non-emergent blood pressure for this morning's vitals. Lungs: No increased work of breathing.  Equal chest rise appreciated Psych: Calm, asleep in bed.   ED Course / MDM  EKG:EKG Interpretation  Date/Time:  Friday Oct 13 2022 19:44:59 EDT Ventricular Rate:  77 PR Interval:  160 QRS Duration: 121 QT Interval:  447 QTC Calculation: 506 R Axis:   63 Text Interpretation: Sinus rhythm Nonspecific intraventricular conduction delay Inferior infarct, old Confirmed by Arby Barrette [phone]) on 10/14/2022 4:44:01 PM  I have reviewed the labs performed to date as well as medications administered while in observation.   Plan  Current plan is for IP psychiatric care per most recent psychiatric assessment. Appreciate psychiatric team's plan for daily reassessment that may change this disposition.     Glyn Ade, MD 10/16/22 [phone]

## 2022-10-17 DIAGNOSIS — R627 Adult failure to thrive: Secondary | ICD-10-CM | POA: Diagnosis not present

## 2022-10-17 DIAGNOSIS — J449 Chronic obstructive pulmonary disease, unspecified: Secondary | ICD-10-CM | POA: Diagnosis not present

## 2022-10-17 DIAGNOSIS — G894 Chronic pain syndrome: Secondary | ICD-10-CM | POA: Diagnosis not present

## 2022-10-17 DIAGNOSIS — I5042 Chronic combined systolic (congestive) and diastolic (congestive) heart failure: Secondary | ICD-10-CM | POA: Diagnosis not present

## 2022-10-17 DIAGNOSIS — R45851 Suicidal ideations: Secondary | ICD-10-CM | POA: Diagnosis not present

## 2022-10-17 DIAGNOSIS — I10 Essential (primary) hypertension: Secondary | ICD-10-CM | POA: Diagnosis not present

## 2022-10-17 DIAGNOSIS — K7460 Unspecified cirrhosis of liver: Secondary | ICD-10-CM | POA: Diagnosis not present

## 2022-10-20 DIAGNOSIS — K7460 Unspecified cirrhosis of liver: Secondary | ICD-10-CM | POA: Diagnosis not present

## 2022-10-20 DIAGNOSIS — I5042 Chronic combined systolic (congestive) and diastolic (congestive) heart failure: Secondary | ICD-10-CM | POA: Diagnosis not present

## 2022-10-20 DIAGNOSIS — Z4502 Encounter for adjustment and management of automatic implantable cardiac defibrillator: Secondary | ICD-10-CM | POA: Diagnosis not present

## 2022-10-20 DIAGNOSIS — I472 Ventricular tachycardia, unspecified: Secondary | ICD-10-CM | POA: Diagnosis not present

## 2022-10-20 DIAGNOSIS — I428 Other cardiomyopathies: Secondary | ICD-10-CM | POA: Diagnosis not present

## 2022-10-20 DIAGNOSIS — J449 Chronic obstructive pulmonary disease, unspecified: Secondary | ICD-10-CM | POA: Diagnosis not present

## 2022-10-20 DIAGNOSIS — I4729 Other ventricular tachycardia: Secondary | ICD-10-CM | POA: Diagnosis not present

## 2022-10-23 DIAGNOSIS — F332 Major depressive disorder, recurrent severe without psychotic features: Secondary | ICD-10-CM | POA: Diagnosis not present

## 2022-10-23 DIAGNOSIS — G894 Chronic pain syndrome: Secondary | ICD-10-CM | POA: Diagnosis not present

## 2022-10-23 DIAGNOSIS — R627 Adult failure to thrive: Secondary | ICD-10-CM | POA: Diagnosis not present

## 2022-10-23 DIAGNOSIS — J449 Chronic obstructive pulmonary disease, unspecified: Secondary | ICD-10-CM | POA: Diagnosis not present

## 2022-10-23 DIAGNOSIS — K7460 Unspecified cirrhosis of liver: Secondary | ICD-10-CM | POA: Diagnosis not present

## 2022-10-23 DIAGNOSIS — I5042 Chronic combined systolic (congestive) and diastolic (congestive) heart failure: Secondary | ICD-10-CM | POA: Diagnosis not present

## 2022-10-23 DIAGNOSIS — I10 Essential (primary) hypertension: Secondary | ICD-10-CM | POA: Diagnosis not present

## 2022-10-24 DIAGNOSIS — I4729 Other ventricular tachycardia: Secondary | ICD-10-CM | POA: Diagnosis not present

## 2022-10-24 DIAGNOSIS — I428 Other cardiomyopathies: Secondary | ICD-10-CM | POA: Diagnosis not present

## 2022-10-24 DIAGNOSIS — Z4502 Encounter for adjustment and management of automatic implantable cardiac defibrillator: Secondary | ICD-10-CM | POA: Diagnosis not present

## 2022-10-27 ENCOUNTER — Encounter: Payer: Self-pay | Admitting: Family Medicine

## 2022-10-27 ENCOUNTER — Non-Acute Institutional Stay: Payer: Medicare HMO | Admitting: Family Medicine

## 2022-10-27 VITALS — BP 122/78 | HR 69 | Temp 98.0°F | Resp 20

## 2022-10-27 DIAGNOSIS — I5042 Chronic combined systolic (congestive) and diastolic (congestive) heart failure: Secondary | ICD-10-CM

## 2022-10-27 DIAGNOSIS — R112 Nausea with vomiting, unspecified: Secondary | ICD-10-CM

## 2022-10-27 DIAGNOSIS — N5082 Scrotal pain: Secondary | ICD-10-CM
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
## 2022-11-14 DIAGNOSIS — I5042 Chronic combined systolic (congestive) and diastolic (congestive) heart failure: Secondary | ICD-10-CM | POA: Diagnosis not present

## 2022-11-14 DIAGNOSIS — I10 Essential (primary) hypertension: Secondary | ICD-10-CM | POA: Diagnosis not present

## 2022-11-14 DIAGNOSIS — G9341 Metabolic encephalopathy: Secondary | ICD-10-CM | POA: Diagnosis not present

## 2022-11-14 DIAGNOSIS — R2689 Other abnormalities of gait and mobility: Secondary | ICD-10-CM | POA: Diagnosis not present

## 2022-11-14 DIAGNOSIS — M6281 Muscle weakness (generalized): Secondary | ICD-10-CM | POA: Diagnosis not present

## 2022-11-14 DIAGNOSIS — R296 Repeated falls: Secondary | ICD-10-CM | POA: Diagnosis not present

## 2022-11-27 DIAGNOSIS — I5042 Chronic combined systolic (congestive) and diastolic (congestive) heart failure: Secondary | ICD-10-CM | POA: Diagnosis not present

## 2022-11-27 DIAGNOSIS — R2689 Other abnormalities of gait and mobility: Secondary | ICD-10-CM | POA: Diagnosis not present

## 2022-11-27 DIAGNOSIS — I10 Essential (primary) hypertension: Secondary | ICD-10-CM | POA: Diagnosis not present

## 2022-11-27 DIAGNOSIS — R296 Repeated falls: Secondary | ICD-10-CM | POA: Diagnosis not present

## 2022-11-27 DIAGNOSIS — G9341 Metabolic encephalopathy: Secondary | ICD-10-CM | POA: Diagnosis not present

## 2022-11-27 DIAGNOSIS — M6281 Muscle weakness (generalized): Secondary | ICD-10-CM | POA: Diagnosis not present

## 2022-11-29 DIAGNOSIS — I10 Essential (primary) hypertension: Secondary | ICD-10-CM | POA: Diagnosis not present

## 2022-11-29 DIAGNOSIS — M6281 Muscle weakness (generalized): Secondary | ICD-10-CM | POA: Diagnosis not present

## 2022-11-29 DIAGNOSIS — R2689 Other abnormalities of gait and mobility: Secondary | ICD-10-CM | POA: Diagnosis not present

## 2022-11-29 DIAGNOSIS — I5042 Chronic combined systolic (congestive) and diastolic (congestive) heart failure: Secondary | ICD-10-CM | POA: Diagnosis not present

## 2022-11-29 DIAGNOSIS — G9341 Metabolic encephalopathy: Secondary | ICD-10-CM | POA: Diagnosis not present

## 2022-11-29 DIAGNOSIS — R296 Repeated falls: Secondary | ICD-10-CM | POA: Diagnosis not present

## 2022-11-30 DIAGNOSIS — G9341 Metabolic encephalopathy: Secondary | ICD-10-CM | POA: Diagnosis not present

## 2022-11-30 DIAGNOSIS — I5042 Chronic combined systolic (congestive) and diastolic (congestive) heart failure: Secondary | ICD-10-CM | POA: Diagnosis not present

## 2022-11-30 DIAGNOSIS — R296 Repeated falls: Secondary | ICD-10-CM | POA: Diagnosis not present

## 2022-11-30 DIAGNOSIS — M6281 Muscle weakness (generalized): Secondary | ICD-10-CM | POA: Diagnosis not present

## 2022-11-30 DIAGNOSIS — R2689 Other abnormalities of gait and mobility: Secondary | ICD-10-CM | POA: Diagnosis not present

## 2022-11-30 DIAGNOSIS — I10 Essential (primary) hypertension: Secondary | ICD-10-CM | POA: Diagnosis not present

## 2022-12-01 DIAGNOSIS — R296 Repeated falls: Secondary | ICD-10-CM | POA: Diagnosis not present

## 2022-12-01 DIAGNOSIS — I10 Essential (primary) hypertension: Secondary | ICD-10-CM | POA: Diagnosis not present

## 2022-12-01 DIAGNOSIS — M6281 Muscle weakness (generalized): Secondary | ICD-10-CM | POA: Diagnosis not present

## 2022-12-01 DIAGNOSIS — I5042 Chronic combined systolic (congestive) and diastolic (congestive) heart failure: Secondary | ICD-10-CM | POA: Diagnosis not present

## 2022-12-01 DIAGNOSIS — R2689 Other abnormalities of gait and mobility: Secondary | ICD-10-CM | POA: Diagnosis not present

## 2022-12-01 DIAGNOSIS — G9341 Metabolic encephalopathy: Secondary | ICD-10-CM | POA: Diagnosis not present

## 2022-12-04 DIAGNOSIS — I10 Essential (primary) hypertension: Secondary | ICD-10-CM | POA: Diagnosis not present

## 2022-12-04 DIAGNOSIS — M6281 Muscle weakness (generalized): Secondary | ICD-10-CM | POA: Diagnosis not present

## 2022-12-04 DIAGNOSIS — R296 Repeated falls: Secondary | ICD-10-CM | POA: Diagnosis not present

## 2022-12-04 DIAGNOSIS — G9341 Metabolic encephalopathy: Secondary | ICD-10-CM | POA: Diagnosis not present

## 2022-12-04 DIAGNOSIS — I5042 Chronic combined systolic (congestive) and diastolic (congestive) heart failure: Secondary | ICD-10-CM | POA: Diagnosis not present

## 2022-12-04 DIAGNOSIS — R2689 Other abnormalities of gait and mobility: Secondary | ICD-10-CM | POA: Diagnosis not present

## 2022-12-05 DIAGNOSIS — I5042 Chronic combined systolic (congestive) and diastolic (congestive) heart failure: Secondary | ICD-10-CM | POA: Diagnosis not present

## 2022-12-05 DIAGNOSIS — M6281 Muscle weakness (generalized): Secondary | ICD-10-CM | POA: Diagnosis not present

## 2022-12-05 DIAGNOSIS — R2689 Other abnormalities of gait and mobility: Secondary | ICD-10-CM | POA: Diagnosis not present

## 2022-12-05 DIAGNOSIS — R296 Repeated falls: Secondary | ICD-10-CM | POA: Diagnosis not present

## 2022-12-05 DIAGNOSIS — G9341 Metabolic encephalopathy: Secondary | ICD-10-CM | POA: Diagnosis not present

## 2022-12-05 DIAGNOSIS — I10 Essential (primary) hypertension: Secondary | ICD-10-CM | POA: Diagnosis not present

## 2022-12-06 DIAGNOSIS — R296 Repeated falls: Secondary | ICD-10-CM | POA: Diagnosis not present

## 2022-12-06 DIAGNOSIS — G9341 Metabolic encephalopathy: Secondary | ICD-10-CM | POA: Diagnosis not present

## 2022-12-06 DIAGNOSIS — I5042 Chronic combined systolic (congestive) and diastolic (congestive) heart failure: Secondary | ICD-10-CM | POA: Diagnosis not present

## 2022-12-06 DIAGNOSIS — M6281 Muscle weakness (generalized): Secondary | ICD-10-CM | POA: Diagnosis not present

## 2022-12-06 DIAGNOSIS — R2689 Other abnormalities of gait and mobility: Secondary | ICD-10-CM | POA: Diagnosis not present

## 2022-12-06 DIAGNOSIS — I10 Essential (primary) hypertension: Secondary | ICD-10-CM | POA: Diagnosis not present
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
## 2022-12-07 DIAGNOSIS — G9341 Metabolic encephalopathy: Secondary | ICD-10-CM | POA: Diagnosis not present

## 2022-12-07 DIAGNOSIS — R2689 Other abnormalities of gait and mobility: Secondary | ICD-10-CM | POA: Diagnosis not present

## 2022-12-07 DIAGNOSIS — R296 Repeated falls: Secondary | ICD-10-CM | POA: Diagnosis not present

## 2022-12-07 DIAGNOSIS — M6281 Muscle weakness (generalized): Secondary | ICD-10-CM | POA: Diagnosis not present

## 2022-12-07 DIAGNOSIS — I5042 Chronic combined systolic (congestive) and diastolic (congestive) heart failure: Secondary | ICD-10-CM | POA: Diagnosis not present

## 2022-12-07 DIAGNOSIS — I10 Essential (primary) hypertension: Secondary | ICD-10-CM | POA: Diagnosis not present

## 2022-12-08 DIAGNOSIS — M6281 Muscle weakness (generalized): Secondary | ICD-10-CM | POA: Diagnosis not present

## 2022-12-08 DIAGNOSIS — I5042 Chronic combined systolic (congestive) and diastolic (congestive) heart failure: Secondary | ICD-10-CM | POA: Diagnosis not present

## 2022-12-08 DIAGNOSIS — I10 Essential (primary) hypertension: Secondary | ICD-10-CM | POA: Diagnosis not present

## 2022-12-08 DIAGNOSIS — G9341 Metabolic encephalopathy: Secondary | ICD-10-CM | POA: Diagnosis not present

## 2022-12-08 DIAGNOSIS — R296 Repeated falls: Secondary | ICD-10-CM | POA: Diagnosis not present

## 2022-12-08 DIAGNOSIS — R2689 Other abnormalities of gait and mobility: Secondary | ICD-10-CM | POA: Diagnosis not present

## 2022-12-11 DIAGNOSIS — R296 Repeated falls: Secondary | ICD-10-CM | POA: Diagnosis not present

## 2022-12-11 DIAGNOSIS — I5042 Chronic combined systolic (congestive) and diastolic (congestive) heart failure: Secondary | ICD-10-CM | POA: Diagnosis not present

## 2022-12-11 DIAGNOSIS — G9341 Metabolic encephalopathy: Secondary | ICD-10-CM | POA: Diagnosis not present

## 2022-12-11 DIAGNOSIS — M6281 Muscle weakness (generalized): Secondary | ICD-10-CM | POA: Diagnosis not present

## 2022-12-11 DIAGNOSIS — R2689 Other abnormalities of gait and mobility: Secondary | ICD-10-CM | POA: Diagnosis not present

## 2022-12-11 DIAGNOSIS — I10 Essential (primary) hypertension: Secondary | ICD-10-CM | POA: Diagnosis not present

## 2022-12-12 DIAGNOSIS — R2689 Other abnormalities of gait and mobility: Secondary | ICD-10-CM | POA: Diagnosis not present

## 2022-12-12 DIAGNOSIS — G9341 Metabolic encephalopathy: Secondary | ICD-10-CM | POA: Diagnosis not present

## 2022-12-12 DIAGNOSIS — I5042 Chronic combined systolic (congestive) and diastolic (congestive) heart failure: Secondary | ICD-10-CM | POA: Diagnosis not present

## 2022-12-12 DIAGNOSIS — M6281 Muscle weakness (generalized): Secondary | ICD-10-CM | POA: Diagnosis not present

## 2022-12-12 DIAGNOSIS — I10 Essential (primary) hypertension: Secondary | ICD-10-CM | POA: Diagnosis not present

## 2022-12-12 DIAGNOSIS — R296 Repeated falls: Secondary | ICD-10-CM | POA: Diagnosis not present

## 2022-12-13 DIAGNOSIS — R2689 Other abnormalities of gait and mobility: Secondary | ICD-10-CM | POA: Diagnosis not present

## 2022-12-13 DIAGNOSIS — R296 Repeated falls: Secondary | ICD-10-CM | POA: Diagnosis not present

## 2022-12-13 DIAGNOSIS — I10 Essential (primary) hypertension: Secondary | ICD-10-CM | POA: Diagnosis not present

## 2022-12-13 DIAGNOSIS — M6281 Muscle weakness (generalized): Secondary | ICD-10-CM | POA: Diagnosis not present

## 2022-12-13 DIAGNOSIS — I5042 Chronic combined systolic (congestive) and diastolic (congestive) heart failure: Secondary | ICD-10-CM | POA: Diagnosis not present

## 2022-12-13 DIAGNOSIS — G9341 Metabolic encephalopathy: Secondary | ICD-10-CM | POA: Diagnosis not present

## 2022-12-14 DIAGNOSIS — I10 Essential (primary) hypertension: Secondary | ICD-10-CM | POA: Diagnosis not present

## 2022-12-14 DIAGNOSIS — R296 Repeated falls: Secondary | ICD-10-CM | POA: Diagnosis not present

## 2022-12-14 DIAGNOSIS — R2689 Other abnormalities of gait and mobility: Secondary | ICD-10-CM | POA: Diagnosis not present

## 2022-12-14 DIAGNOSIS — M6281 Muscle weakness (generalized): Secondary | ICD-10-CM | POA: Diagnosis not present

## 2022-12-14 DIAGNOSIS — G9341 Metabolic encephalopathy: Secondary | ICD-10-CM | POA: Diagnosis not present

## 2022-12-14 DIAGNOSIS — I5042 Chronic combined systolic (congestive) and diastolic (congestive) heart failure: Secondary | ICD-10-CM | POA: Diagnosis not present

## 2022-12-15 DIAGNOSIS — G9341 Metabolic encephalopathy: Secondary | ICD-10-CM | POA: Diagnosis not present

## 2022-12-15 DIAGNOSIS — I10 Essential (primary) hypertension: Secondary | ICD-10-CM | POA: Diagnosis not present

## 2022-12-15 DIAGNOSIS — R2689 Other abnormalities of gait and mobility: Secondary | ICD-10-CM | POA: Diagnosis not present

## 2022-12-15 DIAGNOSIS — I5042 Chronic combined systolic (congestive) and diastolic (congestive) heart failure: Secondary | ICD-10-CM | POA: Diagnosis not present

## 2022-12-15 DIAGNOSIS — R296 Repeated falls: Secondary | ICD-10-CM | POA: Diagnosis not present

## 2022-12-15 DIAGNOSIS — M6281 Muscle weakness (generalized): Secondary | ICD-10-CM | POA: Diagnosis not present

## 2022-12-18 DIAGNOSIS — G9341 Metabolic encephalopathy: Secondary | ICD-10-CM | POA: Diagnosis not present

## 2022-12-18 DIAGNOSIS — I5042 Chronic combined systolic (congestive) and diastolic (congestive) heart failure: Secondary | ICD-10-CM | POA: Diagnosis not present

## 2022-12-18 DIAGNOSIS — I10 Essential (primary) hypertension: Secondary | ICD-10-CM | POA: Diagnosis not present

## 2022-12-18 DIAGNOSIS — R2689 Other abnormalities of gait and mobility: Secondary | ICD-10-CM | POA: Diagnosis not present

## 2022-12-18 DIAGNOSIS — M6281 Muscle weakness (generalized): Secondary | ICD-10-CM | POA: Diagnosis not present

## 2022-12-18 DIAGNOSIS — R296 Repeated falls: Secondary | ICD-10-CM | POA: Diagnosis not present

## 2022-12-19 DIAGNOSIS — G9341 Metabolic encephalopathy: Secondary | ICD-10-CM | POA: Diagnosis not present

## 2022-12-19 DIAGNOSIS — I10 Essential (primary) hypertension: Secondary | ICD-10-CM | POA: Diagnosis not present

## 2022-12-19 DIAGNOSIS — R2689 Other abnormalities of gait and mobility: Secondary | ICD-10-CM | POA: Diagnosis not present

## 2022-12-19 DIAGNOSIS — R296 Repeated falls: Secondary | ICD-10-CM | POA: Diagnosis not present

## 2022-12-19 DIAGNOSIS — M6281 Muscle weakness (generalized): Secondary | ICD-10-CM | POA: Diagnosis not present

## 2022-12-19 DIAGNOSIS — I5042 Chronic combined systolic (congestive) and diastolic (congestive) heart failure: Secondary | ICD-10-CM | POA: Diagnosis not present

## 2022-12-20 DIAGNOSIS — I10 Essential (primary) hypertension: Secondary | ICD-10-CM | POA: Diagnosis not present

## 2022-12-20 DIAGNOSIS — I5042 Chronic combined systolic (congestive) and diastolic (congestive) heart failure: Secondary | ICD-10-CM | POA: Diagnosis not present

## 2022-12-20 DIAGNOSIS — R2689 Other abnormalities of gait and mobility: Secondary | ICD-10-CM | POA: Diagnosis not present

## 2022-12-20 DIAGNOSIS — R296 Repeated falls: Secondary | ICD-10-CM | POA: Diagnosis not present

## 2022-12-20 DIAGNOSIS — M6281 Muscle weakness (generalized): Secondary | ICD-10-CM | POA: Diagnosis not present

## 2022-12-20 DIAGNOSIS — G9341 Metabolic encephalopathy: Secondary | ICD-10-CM | POA: Diagnosis not present

## 2022-12-21 DIAGNOSIS — I5042 Chronic combined systolic (congestive) and diastolic (congestive) heart failure: Secondary | ICD-10-CM | POA: Diagnosis not present

## 2022-12-21 DIAGNOSIS — I10 Essential (primary) hypertension: Secondary | ICD-10-CM | POA: Diagnosis not present

## 2022-12-21 DIAGNOSIS — M6281 Muscle weakness (generalized): Secondary | ICD-10-CM | POA: Diagnosis not present

## 2022-12-21 DIAGNOSIS — G9341 Metabolic encephalopathy: Secondary | ICD-10-CM | POA: Diagnosis not present

## 2022-12-21 DIAGNOSIS — R2689 Other abnormalities of gait and mobility: Secondary | ICD-10-CM | POA: Diagnosis not present

## 2022-12-21 DIAGNOSIS — R296 Repeated falls: Secondary | ICD-10-CM | POA: Diagnosis not present

## 2022-12-25 ENCOUNTER — Other Ambulatory Visit: Payer: Self-pay

## 2022-12-26 DIAGNOSIS — I10 Essential (primary) hypertension: Secondary | ICD-10-CM | POA: Diagnosis not present

## 2022-12-26 DIAGNOSIS — M6281 Muscle weakness (generalized): Secondary | ICD-10-CM | POA: Diagnosis not present

## 2022-12-26 DIAGNOSIS — I5042 Chronic combined systolic (congestive) and diastolic (congestive) heart failure: Secondary | ICD-10-CM | POA: Diagnosis not present

## 2022-12-26 DIAGNOSIS — G9341 Metabolic encephalopathy: Secondary | ICD-10-CM | POA: Diagnosis not present

## 2022-12-26 DIAGNOSIS — R2689 Other abnormalities of gait and mobility: Secondary | ICD-10-CM | POA: Diagnosis not present

## 2022-12-26 DIAGNOSIS — R296 Repeated falls: Secondary | ICD-10-CM | POA: Diagnosis not present

## 2022-12-27 ENCOUNTER — Other Ambulatory Visit: Payer: Self-pay

## 2022-12-27 ENCOUNTER — Emergency Department (HOSPITAL_COMMUNITY): Payer: Medicare HMO

## 2022-12-27 ENCOUNTER — Encounter (HOSPITAL_COMMUNITY): Payer: Self-pay

## 2022-12-27 ENCOUNTER — Emergency Department (HOSPITAL_COMMUNITY)
Admission: EM | Admit: 2022-12-27 | Discharge: 2022-12-27 | Disposition: A | Discharge status: Home / Self Care | Payer: Medicare HMO | Attending: Emergency Medicine | Admitting: Emergency Medicine

## 2022-12-27 DIAGNOSIS — R0602 Shortness of breath: Secondary | ICD-10-CM | POA: Insufficient documentation

## 2022-12-27 DIAGNOSIS — I110 Hypertensive heart disease with heart failure: Secondary | ICD-10-CM | POA: Diagnosis not present

## 2022-12-27 DIAGNOSIS — I5040 Unspecified combined systolic (congestive) and diastolic (congestive) heart failure: Secondary | ICD-10-CM | POA: Diagnosis not present

## 2022-12-27 DIAGNOSIS — E119 Type 2 diabetes mellitus without complications: Secondary | ICD-10-CM | POA: Diagnosis not present

## 2022-12-27 DIAGNOSIS — Z4502 Encounter for adjustment and management of automatic implantable cardiac defibrillator: Secondary | ICD-10-CM | POA: Insufficient documentation

## 2022-12-27 LAB — TROPONIN I (HIGH SENSITIVITY)
Troponin I (High Sensitivity): 11 ng/L (ref <18)
Troponin I (High Sensitivity): 11 ng/L (ref <18)

## 2022-12-27 LAB — COMPREHENSIVE METABOLIC PANEL
ALT: 19 U/L (ref 0–44)
AST: 51 U/L — ABNORMAL HIGH (ref 15–41)
Albumin: 2.2 g/dL — ABNORMAL LOW (ref 3.5–5.0)
Alkaline Phosphatase: 107 U/L (ref 38–126)
Anion gap: 14 (ref 5–15)
BUN: 14 mg/dL (ref 8–23)
CO2: 24 mmol/L (ref 22–32)
Calcium: 8.2 mg/dL — ABNORMAL LOW (ref 8.9–10.3)
Chloride: 99 mmol/L (ref 98–111)
Creatinine, Ser: 0.97 mg/dL (ref 0.61–1.24)
GFR, Estimated: >60 mL/min (ref >60)
Glucose, Bld: 103 mg/dL — ABNORMAL HIGH (ref 70–99)
Potassium: 4 mmol/L (ref 3.5–5.1)
Sodium: 137 mmol/L (ref 135–145)
Total Bilirubin: 3.9 mg/dL — ABNORMAL HIGH (ref 0.3–1.2)
Total Protein: 7.5 g/dL (ref 6.5–8.1)

## 2022-12-27 LAB — CBC
HCT: 34.1 % — ABNORMAL LOW (ref 39.0–52.0)
Hemoglobin: 11.4 g/dL — ABNORMAL LOW (ref 13.0–17.0)
MCH: 37.4 pg — ABNORMAL HIGH (ref 26.0–34.0)
MCHC: 33.4 g/dL (ref 30.0–36.0)
MCV: 111.8 fL — ABNORMAL HIGH (ref 80.0–100.0)
Platelets: 33 10*3/uL — ABNORMAL LOW (ref 150–400)
RBC: 3.05 MIL/uL — ABNORMAL LOW (ref 4.22–5.81)
RDW: 16.1 % — ABNORMAL HIGH (ref 11.5–15.5)
WBC: 4.9 10*3/uL (ref 4.0–10.5)
nRBC: 0 % (ref 0.0–0.2)

## 2022-12-27 LAB — AMMONIA: Ammonia: 59 umol/L — ABNORMAL HIGH (ref 9–35)

## 2022-12-27 MED ORDER — ALBUTEROL SULFATE (2.5 MG/3ML) 0.083% IN NEBU
5.0000 mg | INHALATION_SOLUTION | Freq: Once | RESPIRATORY_TRACT | Status: AC
  Administered 2022-12-27: 5 mg via RESPIRATORY_TRACT
  Filled 2022-12-27: qty 6

## 2022-12-27 MED ORDER — METHYLPREDNISOLONE SODIUM SUCC 125 MG IJ SOLR
125.0000 mg | Freq: Once | INTRAMUSCULAR | Status: AC
  Administered 2022-12-27: 125 mg via INTRAVENOUS
  Filled 2022-12-27: qty 2

## 2022-12-27 NOTE — Discharge Instructions (Addendum)
Your AICD did not fire recently.  Your workup was otherwise reassuring and similar to prior.  Follow-up with your doctor as needed.  Also follow-up with your cardiologist

## 2022-12-27 NOTE — ED Notes (Addendum)
Pt provided with snack bag with sandwich, crackers, and peanut butter with a coke per pt's request.

## 2022-12-27 NOTE — ED Notes (Signed)
This NT and Scarlett, NT changed pt linens and provided peri care. Barrier cream put on due to irritation/redness/oozing blood. Rectal temp taken.

## 2022-12-27 NOTE — ED Provider Notes (Signed)
  Physical Exam  BP 138/70   Pulse 84   Temp 97.8 F (36.6 C) (Rectal)   Resp 17   Ht 5\' 10"  (1.778 m)   Wt 117.9 kg   SpO2 97%   BMI 37.31 kg/m   Physical Exam  Procedures  Procedures  ED Course / MDM   Clinical Course as of 12/27/22 1631  Wed Dec 27, 2022  1357 CBC reviewed interpreted with normal white blood cell count, mild anemia and thrombocytopenia at 33,000 and these are stable from first prior [DR]  1358 Chest x-ray reviewed interpreted and rotated with no definite acute abnormality noted AICD in place multiple lines present [DR]  1411 Complete metabolic panel is reviewed and interpreted with elevated bilirubin at 3.9 which has waxed and waned at about this value over the past several tests [DR]  1412 Ammonia is mildly elevated at 59 [DR]    Clinical Course User Index [DR] Margarita Grizzle, MD   Medical Decision Making Amount and/or Complexity of Data Reviewed Labs: ordered. Radiology: ordered.  Risk Prescription drug management.   Received patient in signout.  AICD reportedly fired.  Blood work is at baseline.  Pending cardiology consult and interrogation of patient's [employer] AICD.  Discussed with [employer] rep.  Had some brief nonsustained V. tach almost 3 weeks ago.  Did not fire even then.  No issues today or last night.  Will discharge home with outpatient follow-up.       Benjiman Core, MD 12/27/22 [phone]

## 2022-12-27 NOTE — ED Triage Notes (Signed)
Pt present to ED from [place] facility with c/o defibrillator firing three times today and one time last night. Pt c/o shortness of breath, denies chest pain at this time. Pt A&Ox4 at this time.

## 2022-12-27 NOTE — ED Notes (Signed)
Rebecca(sister) aware of pt's current ED status.

## 2022-12-27 NOTE — ED Notes (Signed)
[employer] staff present to room for interrogation.

## 2022-12-27 NOTE — ED Provider Notes (Signed)
[place] EMERGENCY DEPARTMENT AT [facility] Provider Note   CSN: [id number] Arrival date & time: 12/27/22  1204     History {Add pertinent medical, surgical, social history, OB history to HPI:1} Chief Complaint  Patient presents with   AICD Problem    [name] is a 67 y.o. male.  HPI 67 yo male ho ischemic cardiomyopathy, combined systolic and diastolic CHF with lymphedema bilaterally, on oxygen at 2 L/min, history of right hip arthroplasty, history of depression, history of [place] cirrhosis, COPD, AAA, hypertension, diabetes, subdural who is in a nursing home and is bedbound presents today with reports that his defibrillator went off several times last night.  He is complaining of shortness of breath.  He denies cough, fever, he has ongoing volume overload does not think his weight has increased.     Home Medications Prior to Admission medications   Medication Sig Start Date End Date Taking? Authorizing Provider  albuterol (PROVENTIL) (2.5 MG/3ML) 0.083% nebulizer solution USE 1 VIAL PER NEBULIZER EVERY 6 HRS AS NEEDED FOR WHEEZING Patient taking differently: Take 2.5 mg by nebulization every 6 (six) hours as needed for shortness of breath. 12/03/20   [name], MD  albuterol (VENTOLIN HFA) 108 (90 Base) MCG/ACT inhaler TAKE 2 PUFFS BY MOUTH EVERY 6 HOURS AS NEEDED FOR WHEEZE OR SHORTNESS OF BREATH Patient taking differently: Inhale 1 puff into the lungs every 6 (six) hours as needed for wheezing or shortness of breath. 12/14/21   [name], MD  carvedilol (COREG) 3.125 MG tablet Take 3.125 mg by mouth 2 (two) times daily.    [provider]  escitalopram (LEXAPRO) 10 MG tablet Take 1 tablet (10 mg total) by mouth daily. 10/16/22 11/15/22  Earney Navy, NP  fentaNYL (DURAGESIC) 75 MCG/HR Place 1 patch onto the skin every 3 (three) days. 09/24/22   Elgergawy, Leana Roe, MD  ferrous sulfate 325 (65 FE) MG tablet Take 1 tablet (325 mg total)  by mouth every other day. 04/29/19   [name], MD  folic acid (FOLVITE) 1 MG tablet TAKE 1 TABLET BY MOUTH EVERY DAY Patient taking differently: Take 1 mg by mouth at bedtime. 11/07/21   [name], MD  furosemide (LASIX) 40 MG tablet Take 1 tablet (40 mg total) by mouth daily. 10/03/22 10/03/23  Leroy Sea, MD  gabapentin (NEURONTIN) 300 MG capsule Take 1 capsule (300 mg total) by mouth in the morning and at bedtime. 06/27/21   [name], MD  lactulose (CHRONULAC) 10 GM/15ML solution Take 30 mLs (20 g total) by mouth 3 (three) times daily. Patient taking differently: Take 30 g by mouth 3 (three) times daily. 10/03/22   Leroy Sea, MD  Misc Natural Products [facility] ADVANCED) CAPS Take 1 capsule by mouth in the morning and at bedtime. 09/24/22   [provider]  nicotine (NICODERM CQ - DOSED IN MG/24 HOURS) 14 mg/24hr patch Place 1 patch (14 mg total) onto the skin daily. 10/16/22   Earney Navy, NP  nitroGLYCERIN (NITROSTAT) 0.4 MG SL tablet Place 1 tablet (0.4 mg total) under the tongue every 5 (five) minutes as needed for chest pain. Patient taking differently: Place 0.4 mg under the tongue every 5 (five) minutes x 3 doses as needed for chest pain. 10/10/12   Lewayne Bunting, MD  ondansetron (ZOFRAN) 4 MG tablet TAKE 1 TABLET BY MOUTH EVERY 8 HOURS AS NEEDED FOR NAUSEA AND VOMITING Patient taking differently: Take 4 mg by mouth  every 8 (eight) hours as needed for nausea or vomiting. 01/09/22   Wyline Mood, MD  oxyCODONE (OXY IR/ROXICODONE) 5 MG immediate release tablet Take 1 tablet (5 mg total) by mouth every 8 (eight) hours as needed for severe pain. 09/24/22   Elgergawy, Leana Roe, MD  OXYGEN Inhale 2 L/min into the lungs continuous.    [provider]  pantoprazole (PROTONIX) 40 MG tablet Take 40 mg by mouth daily before breakfast.    [provider]  spironolactone (ALDACTONE) 25 MG tablet Take 0.5 tablets (12.5 mg total) by  mouth daily. 10/03/22 11/02/22  Leroy Sea, MD  Tiotropium Bromide Monohydrate (SPIRIVA RESPIMAT) 2.5 MCG/ACT AERS Inhale 2 puffs into the lungs daily. 10/04/21   [name], MD  vitamin B-12 (CYANOCOBALAMIN) 500 MCG tablet Take 1 tablet (500 mcg total) by mouth every Monday, Wednesday, and Friday. 12/27/18   [name], MD  XIFAXAN 550 MG TABS tablet Take 550 mg by mouth 2 (two) times daily. 06/24/22   [provider]      Allergies    Statins, Losartan, Wellbutrin [bupropion], Allopurinol, Baclofen, Penicillins, and Tramadol    Review of Systems   Review of Systems  Physical Exam Updated Vital Signs BP 131/74   Pulse 87   Temp 97.8 F (36.6 C) (Rectal)   Resp 16   Ht 1.778 m (5\' 10" )   Wt 117.9 kg   SpO2 100%   BMI 37.31 kg/m  Physical Exam Vitals and nursing note reviewed.  Constitutional:      General: He is not in acute distress.    Appearance: He is obese. He is ill-appearing.  HENT:     Head: Normocephalic.     Right Ear: External ear normal.     Left Ear: External ear normal.     Nose: Nose normal.     Mouth/Throat:     Pharynx: Oropharynx is clear.  Eyes:     Pupils: Pupils are equal, round, and reactive to light.  Cardiovascular:     Rate and Rhythm: Normal rate.  Pulmonary:     Comments: Decreased air movement throughout with few expiratory wheezes Abdominal:     General: There is no distension.     Tenderness: There is no abdominal tenderness.  Musculoskeletal:     Cervical back: Normal range of motion.     Comments: Patient with dressings in place bilateral lower extremities  Skin:    General: Skin is warm and dry.     Capillary Refill: Capillary refill takes less than 2 seconds.     Coloration: Skin is jaundiced.  Neurological:     Mental Status: He is alert.     ED Results / Procedures / Treatments   Labs (all labs ordered are listed, but only abnormal results are displayed) Labs Reviewed  CBC - Abnormal; Notable for  the following components:      Result Value   RBC 3.05 (*)    Hemoglobin 11.4 (*)    HCT 34.1 (*)    MCV 111.8 (*)    MCH 37.4 (*)    RDW 16.1 (*)    Platelets 33 (*)    All other components within normal limits  AMMONIA - Abnormal; Notable for the following components:   Ammonia 59 (*)    All other components within normal limits  COMPREHENSIVE METABOLIC PANEL - Abnormal; Notable for the following components:   Glucose, Bld 103 (*)    Calcium 8.2 (*)    Albumin  2.2 (*)    AST 51 (*)    Total Bilirubin 3.9 (*)    All other components within normal limits  TROPONIN I (HIGH SENSITIVITY)  TROPONIN I (HIGH SENSITIVITY)    EKG EKG Interpretation Date/Time:  Wednesday December 27 2022 12:15:07 EDT Ventricular Rate:  77 PR Interval:  147 QRS Duration:  131 QT Interval:  408 QTC Calculation: 462 R Axis:   62  Text Interpretation: Sinus rhythm Ventricular premature complex Left bundle branch block Confirmed by Margarita Grizzle [phone]) on 12/27/2022 2:13:21 PM  Radiology DG Chest Port 1 View  Result Date: 12/27/2022 CLINICAL DATA:  aicd fired EXAM: PORTABLE CHEST 1 VIEW COMPARISON:  10/02/2022. FINDINGS: Low lung volume. Bilateral lung fields are clear. Bilateral costophrenic angles are clear. Stable enlarged cardio-mediastinal silhouette. There is a left-sided ICD. No acute osseous abnormalities. Cervicothoracic spinal fixation hardware noted. The soft tissues are within normal limits. IMPRESSION: *Cardiomegaly. No active disease. Electronically Signed   By: Jules Schick M.D.   On: 12/27/2022 13:17    Procedures Procedures  {Document cardiac monitor, telemetry assessment procedure when appropriate:1}  Medications Ordered in ED Medications  albuterol (PROVENTIL) (2.5 MG/3ML) 0.083% nebulizer solution 5 mg (5 mg Nebulization Given 12/27/22 1234)  methylPREDNISolone sodium succinate (SOLU-MEDROL) 125 mg/2 mL injection 125 mg (125 mg Intravenous Given 12/27/22 1253)    ED Course/  Medical Decision Making/ A&P Clinical Course as of 12/27/22 1507  Wed Dec 27, 2022  1357 CBC reviewed interpreted with normal white blood cell count, mild anemia and thrombocytopenia at 33,000 and these are stable from first prior [DR]  1358 Chest x-Holle Sprick reviewed interpreted and rotated with no definite acute abnormality noted AICD in place multiple lines present [DR]  1411 Complete metabolic panel is reviewed and interpreted with elevated bilirubin at 3.9 which has waxed and waned at about this value over the past several tests [DR]  1412 Ammonia is mildly elevated at 59 [DR]    Clinical Course User Index [DR] Margarita Grizzle, MD   {   Click here for ABCD2, HEART and other calculatorsREFRESH Note before signing :1}                          Medical Decision Making Amount and/or Complexity of Data Reviewed Labs: ordered. Radiology: ordered.  Risk Prescription drug management.   67 yo male with multiple health problems presents from snf with report that his aicd fired multiple times. Here he appears to be at baseline. First troponin nomrla Ammonia elevated at 59 appears baseline for patient D.W. cardiology.  Defibrillator interrogation pending  Likely discharge if no significant arrhythmia Discussed with cardiology who will review interrogation Plan d/c if negative  Care discussed with Dr. Rubin Payor who will follow up after interrogation {Document critical care time when appropriate:1} {Document review of labs and clinical decision tools ie heart score, Chads2Vasc2 etc:1}  {Document your independent review of radiology images, and any outside records:1} {Document your discussion with family members, caretakers, and with consultants:1} {Document social determinants of health affecting pt's care:1} {Document your decision making why or why not admission, treatments were needed:1} Final Clinical Impression(s) / ED Diagnoses Final diagnoses:  None    Rx / DC Orders ED Discharge  Orders     None

## 2022-12-28 ENCOUNTER — Ambulatory Visit: Payer: Self-pay

## 2022-12-28 ENCOUNTER — Telehealth: Payer: Self-pay

## 2022-12-28 DIAGNOSIS — R296 Repeated falls: Secondary | ICD-10-CM | POA: Diagnosis not present

## 2022-12-28 DIAGNOSIS — I5042 Chronic combined systolic (congestive) and diastolic (congestive) heart failure: Secondary | ICD-10-CM | POA: Diagnosis not present

## 2022-12-28 DIAGNOSIS — I10 Essential (primary) hypertension: Secondary | ICD-10-CM | POA: Diagnosis not present

## 2022-12-28 DIAGNOSIS — M6281 Muscle weakness (generalized): Secondary | ICD-10-CM | POA: Diagnosis not present

## 2022-12-28 DIAGNOSIS — G9341 Metabolic encephalopathy: Secondary | ICD-10-CM | POA: Diagnosis not present

## 2022-12-28 DIAGNOSIS — R2689 Other abnormalities of gait and mobility: Secondary | ICD-10-CM | POA: Diagnosis not present

## 2022-12-28 NOTE — Transitions of Care (Post Inpatient/ED Visit) (Signed)
   12/28/2022  Name: [name] MRN: [id number] DOB: 12-04-1955  Today's TOC FU Call Status: Today's TOC FU Call Status:: Unsuccessul Call (1st Attempt) Unsuccessful Call (1st Attempt) Date: 12/28/22  Attempted to reach the patient regarding the most recent Inpatient/ED visit.  Follow Up Plan: Additional outreach attempts will be made to reach the patient to complete the Transitions of Care (Post Inpatient/ED visit) call.   Signature   Woodfin Ganja LPN [facility] Nurse Health Advisor Direct Dial [phone]

## 2022-12-28 NOTE — Chronic Care Management (AMB) (Signed)
   12/28/2022  GODFREY TRITSCHLER 02-24-1956 [id number]   Reason for Encounter: Patient is not currently enrolled in the CCM program. CCM enrollment status changed to Previously enrolled.   [facility]/Chronic Care Management [phone]

## 2022-12-29 DIAGNOSIS — R296 Repeated falls: Secondary | ICD-10-CM | POA: Diagnosis not present

## 2022-12-29 DIAGNOSIS — G9341 Metabolic encephalopathy: Secondary | ICD-10-CM | POA: Diagnosis not present

## 2022-12-29 DIAGNOSIS — M6281 Muscle weakness (generalized): Secondary | ICD-10-CM | POA: Diagnosis not present

## 2022-12-29 DIAGNOSIS — I5042 Chronic combined systolic (congestive) and diastolic (congestive) heart failure: Secondary | ICD-10-CM | POA: Diagnosis not present

## 2022-12-29 DIAGNOSIS — I10 Essential (primary) hypertension: Secondary | ICD-10-CM | POA: Diagnosis not present

## 2022-12-29 DIAGNOSIS — R2689 Other abnormalities of gait and mobility: Secondary | ICD-10-CM | POA: Diagnosis not present

## 2022-12-29 NOTE — Transitions of Care (Post Inpatient/ED Visit) (Signed)
Unable to reach pt by phone; no answer and no v/m.      12/29/2022  Name: [name] MRN: [id number] DOB: 21-Aug-1955  Today's TOC FU Call Status: Today's TOC FU Call Status:: Unsuccessful Call (2nd Attempt) Unsuccessful Call (1st Attempt) Date: 12/28/22 Unsuccessful Call (2nd Attempt) Date: 12/29/22  Attempted to reach the patient regarding the most recent Inpatient/ED visit.  Follow Up Plan: Additional outreach attempts will be made to reach the patient to complete the Transitions of Care (Post Inpatient/ED visit) call.   Signature Lewanda Rife, LPN

## 2023-01-01 NOTE — Transitions of Care (Post Inpatient/ED Visit) (Signed)
   01/01/2023  Name: [name] MRN: [id number] DOB: [birth year]  Today's TOC FU Call Status: Today's TOC FU Call Status:: Successful TOC FU Call Competed Unsuccessful Call (1st Attempt) Date: 12/28/22 Unsuccessful Call (2nd Attempt) Date: 12/29/22 Unsuccessful Call (3rd Attempt) Date: 01/01/23 [facility] FU Call Complete Date: 01/01/23  Attempted to reach the patient regarding the most recent Inpatient/ED visit.  Follow Up Plan: No further outreach attempts will be made at this time. We have been unable to contact the patient.  Signature   Woodfin Ganja LPN [facility] Nurse Health Advisor Direct Dial [phone]

## 2023-10-04 DEATH — deceased
